# Patient Record
Sex: Male | Born: 1935 | Race: White | Hispanic: No | Marital: Married | Smoking: Former smoker
Health system: Southern US, Community
[De-identification: ages and names within clinical notes are randomized; demographics above are authoritative.]

## PROBLEM LIST (undated history)

## (undated) DIAGNOSIS — K219 Gastro-esophageal reflux disease without esophagitis: Secondary | ICD-10-CM

## (undated) DIAGNOSIS — I2510 Atherosclerotic heart disease of native coronary artery without angina pectoris: Secondary | ICD-10-CM

## (undated) DIAGNOSIS — C801 Malignant (primary) neoplasm, unspecified: Secondary | ICD-10-CM

## (undated) DIAGNOSIS — I4891 Unspecified atrial fibrillation: Secondary | ICD-10-CM

## (undated) DIAGNOSIS — I10 Essential (primary) hypertension: Secondary | ICD-10-CM

## (undated) DIAGNOSIS — D696 Thrombocytopenia, unspecified: Secondary | ICD-10-CM

## (undated) DIAGNOSIS — N400 Enlarged prostate without lower urinary tract symptoms: Secondary | ICD-10-CM

## (undated) DIAGNOSIS — I639 Cerebral infarction, unspecified: Secondary | ICD-10-CM

## (undated) DIAGNOSIS — C61 Malignant neoplasm of prostate: Secondary | ICD-10-CM

## (undated) DIAGNOSIS — C4490 Unspecified malignant neoplasm of skin, unspecified: Secondary | ICD-10-CM

## (undated) DIAGNOSIS — M1990 Unspecified osteoarthritis, unspecified site: Secondary | ICD-10-CM

## (undated) DIAGNOSIS — I499 Cardiac arrhythmia, unspecified: Secondary | ICD-10-CM

## (undated) DIAGNOSIS — Z87442 Personal history of urinary calculi: Secondary | ICD-10-CM

## (undated) DIAGNOSIS — E119 Type 2 diabetes mellitus without complications: Secondary | ICD-10-CM

## (undated) DIAGNOSIS — Z955 Presence of coronary angioplasty implant and graft: Secondary | ICD-10-CM

## (undated) HISTORY — PX: COLONOSCOPY W/ BIOPSIES AND POLYPECTOMY: SHX1376

## (undated) HISTORY — DX: Benign prostatic hyperplasia without lower urinary tract symptoms: N40.0

## (undated) HISTORY — DX: Unspecified osteoarthritis, unspecified site: M19.90

## (undated) HISTORY — DX: Atherosclerotic heart disease of native coronary artery without angina pectoris: I25.10

## (undated) HISTORY — DX: Essential (primary) hypertension: I10

## (undated) HISTORY — DX: Thrombocytopenia, unspecified: D69.6

## (undated) HISTORY — DX: Gastro-esophageal reflux disease without esophagitis: K21.9

## (undated) HISTORY — PX: BACK SURGERY: SHX140

## (undated) HISTORY — DX: Unspecified atrial fibrillation: I48.91

## (undated) NOTE — *Deleted (*Deleted)
Pt ready to be transferred back to [facility].

## (undated) NOTE — *Deleted (*Deleted)
Important Message  Patient Details IM Letter given to the Patient Name: Adrian Singh MRN: [id number] Date of Birth: 18-Nov-1933   Medicare Important Message Given:  Yes     Caren Macadam 06/22/2020, 10:58 AM

## (undated) NOTE — *Deleted (*Deleted)
51 year old male with history of metastatic prostate cancer, atrial fibrillation anticoagulated on rivaroxaban, diabetes comes in with abdominal pain and distention and no bowel movement for the last 3 days.  There is nausea but no vomiting.  Abdomen is mildly distended and soft with bowel sounds decreased.  No history of prior abdominal surgery, but history of adynamic ileus.  CT scan has been ordered.  Labs do show severe hypokalemia.  Will need to check magnesium as well.  Alkaline phosphatase is significantly elevated, presumably secondary to bone metastases from prostate cancer.

---

## 1998-05-11 ENCOUNTER — Ambulatory Visit: Admission: RE | Admit: 1998-05-11 | Discharge: 1998-05-11 | Payer: Self-pay | Admitting: Family Medicine

## 1999-06-13 ENCOUNTER — Ambulatory Visit (HOSPITAL_COMMUNITY): Admission: RE | Admit: 1999-06-13 | Discharge: 1999-06-13 | Payer: Self-pay | Admitting: *Deleted

## 1999-06-13 ENCOUNTER — Encounter: Payer: Self-pay | Admitting: *Deleted

## 1999-07-13 ENCOUNTER — Encounter: Payer: Self-pay | Admitting: *Deleted

## 1999-07-13 ENCOUNTER — Ambulatory Visit (HOSPITAL_COMMUNITY): Admission: RE | Admit: 1999-07-13 | Discharge: 1999-07-13 | Payer: Self-pay | Admitting: *Deleted

## 1999-08-02 ENCOUNTER — Ambulatory Visit (HOSPITAL_COMMUNITY): Admission: RE | Admit: 1999-08-02 | Discharge: 1999-08-02 | Payer: Self-pay | Admitting: *Deleted

## 1999-08-02 ENCOUNTER — Encounter: Payer: Self-pay | Admitting: *Deleted

## 1999-08-16 ENCOUNTER — Encounter: Payer: Self-pay | Admitting: *Deleted

## 1999-08-16 ENCOUNTER — Ambulatory Visit (HOSPITAL_COMMUNITY): Admission: RE | Admit: 1999-08-16 | Discharge: 1999-08-16 | Payer: Self-pay | Admitting: *Deleted

## 1999-08-30 ENCOUNTER — Encounter: Payer: Self-pay | Admitting: *Deleted

## 1999-08-30 ENCOUNTER — Ambulatory Visit (HOSPITAL_COMMUNITY): Admission: RE | Admit: 1999-08-30 | Discharge: 1999-08-30 | Payer: Self-pay | Admitting: *Deleted

## 1999-09-18 HISTORY — PX: ESOPHAGOGASTRODUODENOSCOPY (EGD) WITH ESOPHAGEAL DILATION: SHX5812

## 1999-09-21 ENCOUNTER — Encounter: Payer: Self-pay | Admitting: *Deleted

## 1999-09-26 ENCOUNTER — Encounter: Payer: Self-pay | Admitting: *Deleted

## 1999-09-26 ENCOUNTER — Ambulatory Visit (HOSPITAL_COMMUNITY): Admission: RE | Admit: 1999-09-26 | Discharge: 1999-09-27 | Payer: Self-pay | Admitting: *Deleted

## 1999-09-26 HISTORY — PX: LUMBAR DISC SURGERY: SHX700

## 1999-10-19 ENCOUNTER — Encounter: Payer: Self-pay | Admitting: Gastroenterology

## 1999-10-20 ENCOUNTER — Encounter: Payer: Self-pay | Admitting: Gastroenterology

## 1999-10-20 ENCOUNTER — Encounter (INDEPENDENT_AMBULATORY_CARE_PROVIDER_SITE_OTHER): Payer: Self-pay | Admitting: Specialist

## 1999-10-20 ENCOUNTER — Ambulatory Visit (HOSPITAL_COMMUNITY): Admission: RE | Admit: 1999-10-20 | Discharge: 1999-10-20 | Payer: Self-pay | Admitting: *Deleted

## 2003-03-30 ENCOUNTER — Ambulatory Visit (HOSPITAL_COMMUNITY): Admission: RE | Admit: 2003-03-30 | Discharge: 2003-03-30 | Payer: Self-pay | Admitting: Family Medicine

## 2003-03-30 ENCOUNTER — Encounter: Payer: Self-pay | Admitting: Family Medicine

## 2003-09-18 HISTORY — PX: SHOULDER ARTHROSCOPY W/ ROTATOR CUFF REPAIR: SHX2400

## 2004-02-18 ENCOUNTER — Ambulatory Visit (HOSPITAL_COMMUNITY): Admission: RE | Admit: 2004-02-18 | Discharge: 2004-02-18 | Payer: Self-pay | Admitting: Family Medicine

## 2004-02-25 ENCOUNTER — Encounter: Admission: RE | Admit: 2004-02-25 | Discharge: 2004-05-25 | Payer: Self-pay | Admitting: Family Medicine

## 2004-05-13 ENCOUNTER — Encounter: Admission: RE | Admit: 2004-05-13 | Discharge: 2004-05-13 | Payer: Self-pay | Admitting: Orthopaedic Surgery

## 2004-05-19 ENCOUNTER — Ambulatory Visit (HOSPITAL_COMMUNITY): Admission: RE | Admit: 2004-05-19 | Discharge: 2004-05-19 | Payer: Self-pay | Admitting: Surgery

## 2004-05-19 ENCOUNTER — Ambulatory Visit (HOSPITAL_BASED_OUTPATIENT_CLINIC_OR_DEPARTMENT_OTHER): Admission: RE | Admit: 2004-05-19 | Discharge: 2004-05-19 | Payer: Self-pay | Admitting: Surgery

## 2005-12-20 ENCOUNTER — Encounter: Admission: RE | Admit: 2005-12-20 | Discharge: 2006-01-07 | Payer: Self-pay | Admitting: Gastroenterology

## 2007-12-17 HISTORY — PX: CARDIAC CATHETERIZATION: SHX172

## 2008-01-05 ENCOUNTER — Ambulatory Visit: Payer: Self-pay | Admitting: Cardiology

## 2008-01-06 ENCOUNTER — Inpatient Hospital Stay (HOSPITAL_COMMUNITY): Admission: EM | Admit: 2008-01-06 | Discharge: 2008-01-07 | Payer: Self-pay | Admitting: Emergency Medicine

## 2008-01-07 ENCOUNTER — Encounter: Payer: Self-pay | Admitting: Cardiology

## 2009-12-23 ENCOUNTER — Ambulatory Visit: Payer: Self-pay | Admitting: Internal Medicine

## 2009-12-23 DIAGNOSIS — K219 Gastro-esophageal reflux disease without esophagitis: Secondary | ICD-10-CM | POA: Insufficient documentation

## 2009-12-23 DIAGNOSIS — M1990 Unspecified osteoarthritis, unspecified site: Secondary | ICD-10-CM | POA: Insufficient documentation

## 2009-12-23 DIAGNOSIS — I10 Essential (primary) hypertension: Secondary | ICD-10-CM | POA: Insufficient documentation

## 2009-12-23 DIAGNOSIS — N400 Enlarged prostate without lower urinary tract symptoms: Secondary | ICD-10-CM | POA: Insufficient documentation

## 2009-12-23 DIAGNOSIS — R000 Tachycardia, unspecified: Secondary | ICD-10-CM | POA: Insufficient documentation

## 2009-12-23 DIAGNOSIS — B070 Plantar wart: Secondary | ICD-10-CM | POA: Insufficient documentation

## 2010-01-16 LAB — HM DIABETES EYE EXAM: HM Diabetic Eye Exam: NORMAL

## 2010-01-20 ENCOUNTER — Encounter (INDEPENDENT_AMBULATORY_CARE_PROVIDER_SITE_OTHER): Payer: Self-pay | Admitting: *Deleted

## 2010-01-24 ENCOUNTER — Ambulatory Visit: Payer: Self-pay | Admitting: Gastroenterology

## 2010-01-24 ENCOUNTER — Encounter (INDEPENDENT_AMBULATORY_CARE_PROVIDER_SITE_OTHER): Payer: Self-pay | Admitting: *Deleted

## 2010-02-07 ENCOUNTER — Ambulatory Visit: Payer: Self-pay | Admitting: Gastroenterology

## 2010-02-07 LAB — HM COLONOSCOPY

## 2010-02-14 ENCOUNTER — Encounter: Payer: Self-pay | Admitting: Gastroenterology

## 2010-05-04 ENCOUNTER — Ambulatory Visit: Payer: Self-pay | Admitting: Cardiology

## 2010-05-19 ENCOUNTER — Ambulatory Visit: Payer: Self-pay | Admitting: Internal Medicine

## 2010-05-19 LAB — CONVERTED CEMR LAB: Hgb A1c MFr Bld: 7.1 % — ABNORMAL HIGH (ref 4.6–6.5)

## 2010-06-05 ENCOUNTER — Telehealth: Payer: Self-pay | Admitting: Internal Medicine

## 2010-10-15 LAB — CONVERTED CEMR LAB
ALT: 26 units/L (ref 0–53)
AST: 27 units/L (ref 0–37)
Albumin: 4.6 g/dL (ref 3.5–5.2)
Alkaline Phosphatase: 105 units/L (ref 39–117)
BUN: 21 mg/dL (ref 6–23)
Basophils Absolute: 0 10*3/uL (ref 0.0–0.1)
Basophils Relative: 1 % (ref 0–1)
CO2: 27 meq/L (ref 19–32)
Calcium: 8.9 mg/dL (ref 8.4–10.5)
Chloride: 104 meq/L (ref 96–112)
Cholesterol: 151 mg/dL (ref 0–200)
Creatinine, Ser: 1.32 mg/dL (ref 0.40–1.50)
Creatinine, Urine: 86.4 mg/dL
Eosinophils Absolute: 0.1 10*3/uL (ref 0.0–0.7)
Eosinophils Relative: 1 % (ref 0–5)
Glucose, Bld: 82 mg/dL (ref 70–99)
HCT: 42.7 % (ref 39.0–52.0)
HDL: 33 mg/dL — ABNORMAL LOW (ref >39)
Hemoglobin: 13.5 g/dL (ref 13.0–17.0)
Hgb A1c MFr Bld: 7.2 % — ABNORMAL HIGH (ref 4.6–6.1)
LDL Cholesterol: 81 mg/dL (ref 0–99)
Lymphocytes Relative: 30 % (ref 12–46)
Lymphs Abs: 1.7 10*3/uL (ref 0.7–4.0)
MCHC: 31.6 g/dL (ref 30.0–36.0)
MCV: 87 fL (ref 78.0–100.0)
Microalb Creat Ratio: 5.8 mg/g (ref 0.0–30.0)
Microalb, Ur: 0.5 mg/dL (ref 0.00–1.89)
Monocytes Absolute: 0.7 10*3/uL (ref 0.1–1.0)
Monocytes Relative: 12 % (ref 3–12)
Neutro Abs: 3.3 10*3/uL (ref 1.7–7.7)
Neutrophils Relative %: 57 % (ref 43–77)
Platelets: 142 10*3/uL — ABNORMAL LOW (ref 150–400)
Potassium: 4.5 meq/L (ref 3.5–5.3)
RBC: 4.91 M/uL (ref 4.22–5.81)
RDW: 13.9 % (ref 11.5–15.5)
Sodium: 140 meq/L (ref 135–145)
Total Bilirubin: 0.4 mg/dL (ref 0.3–1.2)
Total CHOL/HDL Ratio: 4.6
Total Protein: 7.7 g/dL (ref 6.0–8.3)
Triglycerides: 184 mg/dL — ABNORMAL HIGH (ref <150)
VLDL: 37 mg/dL (ref 0–40)
WBC: 5.8 10*3/uL (ref 4.0–10.5)

## 2010-10-17 NOTE — Procedures (Signed)
Summary: Colonoscopy  Patient: [name] Note: All result statuses are Final unless otherwise noted.  Tests: (1) Colonoscopy (COL)   COL Colonoscopy           DONE     [facility]     [address]     [place], [place]  [address]           COLONOSCOPY PROCEDURE REPORT           PATIENT:  Adrian Singh, Adrian Singh  MR#:  [id number]     BIRTHDATE:  [birth year], 75 yrs. old  GENDER:  male     ENDOSCOPIST:  Rachael Fee, MD     REF. BY:  Tillman Abide, M.D.     PROCEDURE DATE:  02/07/2010     PROCEDURE:  Colonoscopy with biopsy, Colonoscopy with snare     polypectomy     ASA CLASS:  Class II     INDICATIONS:  Routine Risk Screening     MEDICATIONS:   Fentanyl 50 mcg IV, Versed 5 mg IV           DESCRIPTION OF PROCEDURE:   After the risks benefits and     alternatives of the procedure were thoroughly explained, informed     consent was obtained.  Digital rectal exam was performed and     revealed no rectal masses.   The LB CF-[id number] endoscope     was introduced through the anus and advanced to the cecum, which     was identified by both the appendix and ileocecal valve, without     limitations.  The quality of the prep was good, using MoviPrep.     The instrument was then slowly withdrawn as the colon was fully     examined.     <<PROCEDUREIMAGES>>           FINDINGS: There was a focal 10-25mm area of ertyhematous, friable     mucosa containing a small clean based, shallow ulcer (2mm) located     in cecum. This did not appear neoplastic, multiple biopsies were     taken (see image2), pathology jar 1.  A sessile polyp was found in     the mid transverse colon. This was 4mm aross, removed with cold     snare and sent to pathology (jar 2) (see image3 and image4).  This     was otherwise a normal examination of the colon (see image1 and     image5).  External hemorrhoids were found. These were small, not     thrombosed.   Retroflexed views in the rectum revealed no  abnormalities.    The scope was then withdrawn from the patient     and the procedure completed.           COMPLICATIONS:  None           ENDOSCOPIC IMPRESSION:     1) Abnormal mucosa in cecum containing small shallow ulcer, did     not appear neoplastic.  Biopsied, suspect NSAID related changes.     2) Sessile polyp in the mid transverse colon, removed and sent     to pathology     3) Otherwise normal examination     4) External hemorrhoids, small           RECOMMENDATIONS:     1) If the polyp(s) removed today are proven to be adenomatous     (pre-cancerous) polyps, you will need a repeat colonoscopy in  5     years. Otherwise you should continue to follow colorectal cancer     screening guidelines for "routine risk" patients with colonoscopy     in 10 years.     2) You will receive a letter within 1-2 weeks with the results     of your biopsy as well as final recommendations. Please call my     office if you have not received a letter after 3 weeks.           ______________________________     Rachael Fee, MD           n.     eSIGNED:   Rachael Fee at 02/07/2010 09:50 AM           Elliot Dally, [phone]  Note: An exclamation mark (!) indicates a result that was not dispersed into the flowsheet. Document Creation Date: 02/07/2010 9:51 AM _______________________________________________________________________  (1) Order result status: Final Collection or observation date-time: 02/07/2010 09:39 Requested date-time:  Receipt date-time:  Reported date-time:  Referring Physician:   Ordering Physician: Rob Bunting [id number]) Specimen Source:  Source: Launa Grill Order Number: [id number] Lab site:   Appended Document: Colonoscopy     Procedures Next Due Date:    Colonoscopy: 01/2020

## 2010-10-17 NOTE — Letter (Signed)
Summary: [facility] Instructions  [facility]  [address] [place], [place] [address]   Phone: [phone]  Fax: [phone]       Lindon PIZZUTO    [birth year]    MRN: [id number]        Procedure Day Dorna Bloom:  Jake Shark  02/07/10     Arrival Time:  8:00am     Procedure Time:  9:00am     Location of Procedure:                    Juliann Pares  [facility] (4th Floor)                        PREPARATION FOR COLONOSCOPY WITH MOVIPREP   Starting 5 days prior to your procedure THURSDAY 02/02/10  do not eat nuts, seeds, popcorn, corn, beans, peas,  salads, or any raw vegetables.  Do not take any fiber supplements (e.g. Metamucil, Citrucel, and Benefiber).  THE DAY BEFORE YOUR PROCEDURE         DATE:  02/06/10   DAY: MONDAY  1.  Drink clear liquids the entire day-NO SOLID FOOD  2.  Do not drink anything colored red or purple.  Avoid juices with pulp.  No orange juice.  3.  Drink at least 64 oz. (8 glasses) of fluid/clear liquids during the day to prevent dehydration and help the prep work efficiently.  CLEAR LIQUIDS INCLUDE: Water Jello Ice Popsicles Tea (sugar ok, no milk/cream) Powdered fruit flavored drinks Coffee (sugar ok, no milk/cream) Gatorade Juice: apple, white grape, white cranberry  Lemonade Clear bullion, consomm, broth Carbonated beverages (any kind) Strained chicken noodle soup Hard Candy                             4.  In the morning, mix first dose of MoviPrep solution:    Empty 1 Pouch A and 1 Pouch B into the disposable container    Add lukewarm drinking water to the top line of the container. Mix to dissolve    Refrigerate (mixed solution should be used within 24 hrs)  5.  Begin drinking the prep at 5:00 p.m. The MoviPrep container is divided by 4 marks.   Every 15 minutes drink the solution down to the next mark (approximately 8 oz) until the full liter is complete.   6.  Follow completed prep with 16 oz of clear liquid of your  choice (Nothing red or purple).  Continue to drink clear liquids until bedtime.  7.  Before going to bed, mix second dose of MoviPrep solution:    Empty 1 Pouch A and 1 Pouch B into the disposable container    Add lukewarm drinking water to the top line of the container. Mix to dissolve    Refrigerate  THE DAY OF YOUR PROCEDURE      DATE: 02/07/10  DAY: TUESDAY  Beginning at 4:00 a.m. (5 hours before procedure):         1. Every 15 minutes, drink the solution down to the next mark (approx 8 oz) until the full liter is complete.  2. Follow completed prep with 16 oz. of clear liquid of your choice.    3. You may drink clear liquids until 7:00am (2 HOURS BEFORE PROCEDURE).   MEDICATION INSTRUCTIONS  Unless otherwise instructed, you should take regular prescription medications with a small sip of water   as  early as possible the morning of your procedure.  See separate diabetic instructions  Additional medication instructions: Hold Triam/HCTZ and Glimepiride the morning of procedure.          OTHER INSTRUCTIONS  You will need a responsible adult at least 75 years of age to accompany you and drive you home.   This person must remain in the waiting room during your procedure.  Wear loose fitting clothing that is easily removed.  Leave jewelry and other valuables at home.  However, you may wish to bring a book to read or  an iPod/MP3 player to listen to music as you wait for your procedure to start.  Remove all body piercing jewelry and leave at home.  Total time from sign-in until discharge is approximately 2-3 hours.  You should go home directly after your procedure and rest.  You can resume normal activities the  day after your procedure.  The day of your procedure you should not:   Drive   Make legal decisions   Operate machinery   Drink alcohol   Return to work  You will receive specific instructions about eating, activities and medications before you  leave.    The above instructions have been reviewed and explained to me by   Wyona Almas RN  Jan 24, 2010 10:35 AM     I fully understand and can verbalize these instructions _____________________________ Date _________

## 2010-10-17 NOTE — Letter (Signed)
Summary: Diabetic Instructions  [facility]  [address] [place], [place] [address]   Phone: [phone]  Fax: [phone]    Adrian Singh [birth year] MRN: [id number]   _x  _   ORAL DIABETIC MEDICATION INSTRUCTIONS  The day before your procedure:   Take your diabetic pill as you do normally  The day of your procedure:   Do not take your diabetic pill    We will check your blood sugar levels during the admission process and again in Recovery before discharging you home  ________________________________________________________________________

## 2010-10-17 NOTE — Assessment & Plan Note (Signed)
Summary: NEW MEDICARE PT TO ESTABH   Vital Signs:  Patient profile:   77 year old male Height:      69 inches Weight:      218 pounds BMI:     32.31 Temp:     98.5 degrees F oral Pulse rate:   72 / minute Pulse rhythm:   regular BP sitting:   138 / 68  (left arm) Cuff size:   large  Vitals Entered By: Mervin Hack CMA Duncan Dull) (December 23, 2009 2:13 PM) CC: new patient to establish care   History of Present Illness: Wife switched doctors and so he is switching Lives in [place]  Sees Dr Sw[place]and had rapid heart rate  2008-02-25  (as high as 180) had cath and has done well on the medication Not sure what the diagnosis is   HTN also Goes back to 1979  DM since  2003-02-25 Checks sugars  ~ once a week usually around 120 in AM using just 1/2 tab---had low sugar reactions on full tab Overdue for eye exam some foot pain from bunion no ulcers  GERD in past symptoms controlled with the pantoprazole  some BPH  voids okay  chronic arthritis takes aleve  ~ daily stays active and this helps    Preventive Screening-Counseling & Management  Alcohol-Tobacco     Smoking Status: quit  Allergies (verified): No Known Drug Allergies  Past History:  Past Medical History: Diabetes mellitus, type II GERD Hypertension Osteoarthritis Benign prostatic hypertrophy Tachycardia------------------------------------------- Dr Swaziland  Past Surgical History: EGD with dilation  02/25/00 Back surgery  02/25/00 Shoulder surgery (left rotator cuff)  2004-02-25  Family History: Dad died @73    stroke, PVD (amputation) Mom died @92   CHF, CAD with multiple MI 5 brothers--2 died (COPD, heart//// some cancer) 4 sisters--1 sister died (lung cancer,etc) No colon or prostate cancer DM in nephew (IDDM)  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 1 son died in MVA Former Smoker--quit after just a few years Alcohol use-no Smoking Status:  quit  Review of  Systems General:  Denies sleep disorder; weight stable wears seat belt. Eyes:  Complains of vision loss-1 eye; denies double vision; left eye is "lazy". ENT:  Complains of decreased hearing; denies ringing in ears; stable hearing loss Full dentures. CV:  Complains of chest pain or discomfort, lightheadness, and palpitations; denies difficulty breathing at night, difficulty breathing while lying down, fainting, and shortness of breath with exertion; seldom has chest pain--just when stressed out rare palpitations--occ pause Occ mild orthostatic dizziness. Resp:  Denies cough and shortness of breath. GI:  Complains of constipation and indigestion; denies abdominal pain, bloody stools, dark tarry stools, nausea, and vomiting; heartburn controlled well. GU:  Complains of nocturia and urinary frequency; stable nocturia x 2. MS:  Complains of joint pain and low back pain; denies joint swelling; does okay with exercise and aleve. Derm:  Complains of lesion(s); denies rash; round spot on right foot no itching. Neuro:  Complains of tingling; denies headaches, numbness, and weakness; occ left leg tingling when back acts up. Psych:  Denies anxiety and depression. Heme:  Denies abnormal bruising and enlarge lymph nodes. Allergy:  Denies seasonal allergies and sneezing; no major symptoms.  Physical Exam  General:  alert and normal appearance.   Eyes:  pupils equal, pupils round, and pupils reactive to light.   Mouth:  no erythema, no exudates, and edentulous.   Plates in  Neck:  supple, no masses, no  thyromegaly, no carotid bruits, and no cervical lymphadenopathy.   Lungs:  normal respiratory effort and normal breath sounds.   Heart:  normal rate, regular rhythm, no murmur, and no gallop.   Abdomen:  soft and non-tender.   Msk:  no joint tenderness and no joint swelling.   Pulses:  2+ in feet Extremities:  no edema Neurologic:  alert & oriented X3, strength normal in all extremities, and gait  normal.   Skin:  plantar wart on right no suspicious lesions and no ulcerations.   Axillary Nodes:  No palpable lymphadenopathy Psych:  normally interactive, good eye contact, not anxious appearing, and not depressed appearing.    Diabetes Management Exam:    Foot Exam (with socks and/or shoes not present):       Sensory-Pinprick/Light touch:          Left medial foot (L-4): diminished          Left dorsal foot (L-5): diminished          Left lateral foot (S-1): diminished          Right medial foot (L-4): diminished          Right dorsal foot (L-5): diminished          Right lateral foot (S-1): diminished       Inspection:          Left foot: normal          Right foot: abnormal             Comments: callous under corn pad with wart       Nails:          Left foot: fungal infection          Right foot: fungal infection   Impression & Recommendations:  Problem # 1:  HYPERTENSION (ICD-401.9) Assessment Comment Only  good control no changes needed check labs His updated medication list for this problem includes:    Ramipril 5 Mg Caps (Ramipril) .Marland Kitchen... Take 1 by mouth once daily    Metoprolol Succinate 50 Mg Xr24h-tab (Metoprolol succinate) .Marland Kitchen... Take 1 by mouth once daily    Triamterene-hctz 37.5-25 Mg Tabs (Triamterene-hctz) .Marland Kitchen... Take 1 by mouth once daily    Doxazosin Mesylate 2 Mg Tabs (Doxazosin mesylate) .Marland Kitchen... Take 1 by mouth once daily  BP today: 138/68  Orders: T-Comprehensive Metabolic Panel ([address]2952-84132) T-CBC w/Diff (44010-27253)  Problem # 2:  TACHYCARDIA (ICD-785.0) Assessment: Comment Only  sounds like PSVT ??atrial fib paroxysm will wait till I can review Dr Elvis Coil next note  Orders: EKG w/ Interpretation ([address])  Problem # 3:  DIABETES MELLITUS, TYPE II (ICD-250.00) Assessment: Comment Only  seems to have good control will set up labs and eye appt  His updated medication list for this problem includes:    Ramipril 5 Mg Caps (Ramipril) .Marland Kitchen...  Take 1 by mouth once daily    Glimepiride 4 Mg Tabs (Glimepiride) .Marland Kitchen... Take 1 by mouth once daily    Aspirin 325 Mg Tabs (Aspirin) .Marland Kitchen... Take 1 by mouth once daily  Orders: Venipuncture ([address]) Specimen Handling ([address]) T- Hemoglobin A1C (59563-87564) T-Urine Microalbumin w/creat. ratio [phone]) T-Lipid Profile [phone]) Ophthalmology Referral (Ophthalmology)  Problem # 4:  GERD (ICD-530.81) Assessment: Unchanged okay with med  His updated medication list for this problem includes:    Pantoprazole Sodium 40 Mg Tbec (Pantoprazole sodium) .Marland Kitchen... Take 1 by mouth once daily  Problem # 5:  BENIGN PROSTATIC HYPERTROPHY (ICD-600.00) Assessment: Unchanged voids fairly well  on doxazosin  Problem # 6:  OSTEOARTHRITIS (ICD-715.90) Assessment: Comment Only mild symptoms  mostly back   Problem # 7:  PLANTAR WART, RIGHT (ICD-078.12) Assessment: Comment Only  pared with scalpel and abnormal tissue treated with liquid nitrogen fo 25 seconds x 2  Orders: Wart Destruct <14 ([address])  Complete Medication List: 1)  Pantoprazole Sodium 40 Mg Tbec (Pantoprazole sodium) .... Take 1 by mouth once daily 2)  Ramipril 5 Mg Caps (Ramipril) .... Take 1 by mouth once daily 3)  Metoprolol Succinate 50 Mg Xr24h-tab (Metoprolol succinate) .... Take 1 by mouth once daily 4)  Triamterene-hctz 37.5-25 Mg Tabs (Triamterene-hctz) .... Take 1 by mouth once daily 5)  Doxazosin Mesylate 2 Mg Tabs (Doxazosin mesylate) .... Take 1 by mouth once daily 6)  Glimepiride 4 Mg Tabs (Glimepiride) .... Take 1 by mouth once daily 7)  Aspirin 325 Mg Tabs (Aspirin) .... Take 1 by mouth once daily  Other Orders: Tdap => 89yrs IM ([address]) Admin 1st Vaccine ([address]) Gastroenterology Referral (GI)  Patient Instructions: 1)  Try stablity shoes 2)  Please schedule a follow-up appointment in 6 months .  3)  Schedule a colonoscopy/ sigmoidoscopy to help detect colon cancer.  4)  Referral Appointment Information 5)   Day/Date: 6)  Time: 7)  Place/MD: 8)  Address: 9)  Phone/Fax: 10)  Patient given appointment information. Information/Orders faxed/mailed.  Current Allergies (reviewed today): No known allergies    Immunization History:  Tetanus/Td Immunization History:    Tetanus/Td:  Tdap (12/23/2009)  Pneumovax Immunization History:    Pneumovax:  Historical (12/17/2006)  Immunizations Administered:  Tetanus Vaccine:    Vaccine Type: Tdap    Site: left deltoid    Mfr: GlaxoSmithKline    Dose: 0.5 ml    Route: IM    Given by: Mervin Hack CMA (AAMA)    Exp. Date: 12/10/2011    Lot #: [id number]    VIS given: 08/05/07 version given December 23, 2009.     EKG  Procedure date:  12/23/2009  Findings:      sinus @63  possible prior IWMI otherwise normal  Appended Document: NEW MEDICARE PT TO ESTABH

## 2010-10-17 NOTE — Miscellaneous (Signed)
Summary: [facility] Previsit/prep  Clinical Lists Changes  Medications: Added new medication of MOVIPREP 100 GM  SOLR (PEG-KCL-NACL-NASULF-NA ASC-C) As per prep instructions. - Signed Rx of MOVIPREP 100 GM  SOLR (PEG-KCL-NACL-NASULF-NA ASC-C) As per prep instructions.;  #1 x 0;  Signed;  Entered by: Wyona Almas RN;  Authorized by: Rachael Fee MD;  Method used: [facility] to [facility] [phone]*, [address] [place], [place], [place]  [address], Ph: [phone] or [phone], Fax: [phone] Observations: Added new observation of NKA: T (01/24/2010 9:49)    Prescriptions: MOVIPREP 100 GM  SOLR (PEG-KCL-NACL-NASULF-NA ASC-C) As per prep instructions.  #1 x 0   Entered by:   Wyona Almas RN   Authorized by:   Rachael Fee MD   Signed by:   Wyona Almas RN on 01/24/2010   Method used:   [facility]ically to        [facility] [phone]* (retail)       [address] [place], [place]  [address]       Ph: [phone] or [phone]       Fax: [phone]   RxID:   [id number]

## 2010-10-17 NOTE — Progress Notes (Signed)
Summary: wants to change to flomax  Phone Note Call from Patient Call back at Home Phone [phone]   Caller: Patient Summary of Call: Pt has been on doxazosin but wants to change to flomax- doxazosin is not working well for him.  He requests a 90 day supply [facility] [address] Initial call taken by: Lowella Petties CMA,  June 05, 2010 12:42 PM  Follow-up for Phone Call        okay to stop the doxazosin and send Rx for tamsulosin 0.4mg  #90 x 3 1 daily  if that doesn't work, we may need to add another med Follow-up by: Cindee Salt MD,  June 05, 2010 1:49 PM  Additional Follow-up for Phone Call Additional follow up Details #1::        [facility] [address] [facility] in [place]. Additional Follow-up by: Lowella Petties CMA,  June 05, 2010 2:46 PM    New/Updated Medications: FLOMAX 0.4 MG CAPS (TAMSULOSIN HCL) take one by mouth daily Prescriptions: FLOMAX 0.4 MG CAPS (TAMSULOSIN HCL) take one by mouth daily  #90 x 3   Entered by:   Lowella Petties CMA   Authorized by:   Cindee Salt MD   Signed by:   Lowella Petties CMA on 06/05/2010   Method used:   [facility]ronically to        [facility] [phone]* (retail)       [address] [place], [place]  [address]       Ph: [phone] or [phone]       Fax: [phone]   RxID:   [id number]   Prior Medications: PANTOPRAZOLE SODIUM 40 MG TBEC (PANTOPRAZOLE SODIUM) take 1 by mouth once daily RAMIPRIL 5 MG CAPS (RAMIPRIL) take 1 by mouth once daily METOPROLOL SUCCINATE 50 MG XR24H-TAB (METOPROLOL SUCCINATE) take 1 by mouth once daily TRIAMTERENE-HCTZ 37.5-25 MG TABS (TRIAMTERENE-HCTZ) take 1/2  by mouth once daily GLIMEPIRIDE 4 MG TABS (GLIMEPIRIDE) take 1/2  by mouth once daily ASPIRIN 325 MG TABS (ASPIRIN) take 1 by mouth once daily Current Allergies: No known allergies

## 2010-10-17 NOTE — Letter (Signed)
Summary: Results Letter  [facility]  [address] [place], [place] [address]   Phone: [phone]  Fax: [phone]        Feb 14, 2010 MRN: [id number]    [facility] [address] [place], [place]  [address]    Dear Mr. Diemer,    Good news.  The polyp(s) that were removed during your recent procedure were NOT pre-cancerous. You should continue to follow current colorectal cancer screening guidelines with a repeat colonoscopy in 10 years.  We will therefore put your information in our reminder system and will contact you in 10 years to schedule a repeat procedure.    Also, the biopsies I took from the shallow ulceration confirmed inflammation without any sign of cancer.  I suspec the ulcer is from NSAID, ASA type medicines.  You should limit those medicines as best as possible to avoid further damage to colon, GI tract.     Sincerely,  Rachael Fee MD  This letter has been electronically signed by your physician.  Appended Document: Results Letter letter mailed.

## 2010-10-17 NOTE — Procedures (Signed)
Summary: [facility] / [facility]  [facility] / [facility]   Imported By: Lennie Odor 02/10/2010 13:48:53  _____________________________________________________________________  External Attachment:    Type:   Image     Comment:   External Document

## 2010-10-17 NOTE — Assessment & Plan Note (Signed)
Summary: ROA FOR 6 MTH F/UP/JRR R/S FROM 9/16   Vital Signs:  Patient profile:   77 year old male Height:      69 inches Weight:      216 pounds BMI:     32.01 Temp:     98.1 degrees F oral Pulse rate:   76 / minute Pulse rhythm:   regular BP sitting:   163 / 82  (left arm) Cuff size:   large  Vitals Entered By: Lewanda Rife LPN (May 19, 2010 8:16 AM) CC: six month f/u   History of Present Illness: Doing well Did have colonoscopy. Has cut out most of his aleve in view of the colitis  Feels that the aleve does do better for his arthritis Generally only uses about 2 per week. Did have period of taking more when back and shoulders were worse 15-20 minutes of exercise seem to really help his back Able to stretch slowly and not getting the spasm the same  Checks sugars  1-2 per week Run  ~120 and below usually Occ mild hypoglycemic reactions but not really since back on 1/2 tab  No trouble voiding Nocturia x 2 in general No sig daytime troubles  No chest pain  No SOB in general Occ lightheadedness if he raises up quickly from bending over---can be slight SOB then  Allergies (verified): No Known Drug Allergies  Past History:  Past medical, surgical, family and social histories (including risk factors) reviewed for relevance to current acute and chronic problems.  Past Medical History: Reviewed history from 12/23/2009 and no changes required. Diabetes mellitus, type II GERD Hypertension Osteoarthritis Benign prostatic hypertrophy Tachycardia------------------------------------------- Dr Swaziland  Past Surgical History: Reviewed history from 12/23/2009 and no changes required. EGD with dilation  2001 Back surgery  2001 Shoulder surgery (left rotator cuff)  2005  Family History: Reviewed history from 12/23/2009 and no changes required. Dad died @73    stroke, PVD (amputation) Mom died @92   CHF, CAD with multiple MI 5 brothers--2 died (COPD, heart//// some  cancer) 4 sisters--1 sister died (lung cancer,etc) No colon or prostate cancer DM in nephew (IDDM)  Social History: Reviewed history from 12/23/2009 and no changes required. 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 1 son died in MVA Former Smoker--quit after just a few years Alcohol use-no  Review of Systems       Occ gets crick in neck Weight fairly stable sleeps okay  Physical Exam  General:  alert and normal appearance.   Neck:  supple, no masses, no thyromegaly, no carotid bruits, and no cervical lymphadenopathy.   Lungs:  normal respiratory effort, no intercostal retractions, no accessory muscle use, and normal breath sounds.   Heart:  normal rate, regular rhythm, no murmur, and no gallop.   Pulses:  1+ in feet Extremities:  no edema Psych:  normally interactive, good eye contact, not anxious appearing, and not depressed appearing.    Diabetes Management Exam:    Foot Exam (with socks and/or shoes not present):       Sensory-Pinprick/Light touch:          Left medial foot (L-4): diminished          Left dorsal foot (L-5): diminished          Left lateral foot (S-1): diminished          Right medial foot (L-4): diminished          Right dorsal foot (L-5): diminished  Right lateral foot (S-1): diminished       Inspection:          Left foot: normal          Right foot: abnormal             Comments: corn on plantar surface       Nails:          Left foot: fungal infection          Right foot: fungal infection    Eye Exam:       Eye Exam done elsewhere          Date: 01/16/2010          Results: normal          Done by:  Eye   Impression & Recommendations:  Problem # 1:  DIABETES MELLITUS, TYPE II (ICD-250.00) Assessment Unchanged  seems to have good control will recheck LDL <100 without meds  His updated medication list for this problem includes:    Ramipril 5 Mg Caps (Ramipril) .Marland Kitchen... Take 1 by mouth once  daily    Glimepiride 4 Mg Tabs (Glimepiride) .Marland Kitchen... Take 1/2  by mouth once daily    Aspirin 325 Mg Tabs (Aspirin) .Marland Kitchen... Take 1 by mouth once daily  Labs Reviewed: Creat: 1.32 (12/23/2009)     Last Eye Exam: normal (01/16/2010) Reviewed HgBA1c results: 7.2 (12/23/2009)  Orders: Venipuncture (36644) TLB-A1C / Hgb A1C (Glycohemoglobin) (83036-A1C)  Problem # 2:  HYPERTENSION (ICD-401.9) Assessment: Comment Only probably fine didn't take his meds yet today  His updated medication list for this problem includes:    Ramipril 5 Mg Caps (Ramipril) .Marland Kitchen... Take 1 by mouth once daily    Metoprolol Succinate 50 Mg Xr24h-tab (Metoprolol succinate) .Marland Kitchen... Take 1 by mouth once daily    Triamterene-hctz 37.5-25 Mg Tabs (Triamterene-hctz) .Marland Kitchen... Take 1/2  by mouth once daily    Doxazosin Mesylate 2 Mg Tabs (Doxazosin mesylate) .Marland Kitchen... Take 1 by mouth once daily  BP today: 163/82 Prior BP: 138/68 (12/23/2009)  Labs Reviewed: K+: 4.5 (12/23/2009) Creat: : 1.32 (12/23/2009)   Chol: 151 (12/23/2009)   HDL: 33 (12/23/2009)   LDL: 81 (12/23/2009)   TG: 184 (12/23/2009)  Problem # 3:  BENIGN PROSTATIC HYPERTROPHY (ICD-600.00) Assessment: Unchanged voids okay on med mild orthostasis---if worsens would change to tamsulosin  Problem # 4:  OSTEOARTHRITIS (ICD-715.90) Assessment: Improved only rarely needs the aleve now  His updated medication list for this problem includes:    Aspirin 325 Mg Tabs (Aspirin) .Marland Kitchen... Take 1 by mouth once daily  Complete Medication List: 1)  Pantoprazole Sodium 40 Mg Tbec (Pantoprazole sodium) .... Take 1 by mouth once daily 2)  Ramipril 5 Mg Caps (Ramipril) .... Take 1 by mouth once daily 3)  Metoprolol Succinate 50 Mg Xr24h-tab (Metoprolol succinate) .... Take 1 by mouth once daily 4)  Triamterene-hctz 37.5-25 Mg Tabs (Triamterene-hctz) .... Take 1/2  by mouth once daily 5)  Doxazosin Mesylate 2 Mg Tabs (Doxazosin mesylate) .... Take 1 by mouth once daily 6)   Glimepiride 4 Mg Tabs (Glimepiride) .... Take 1/2  by mouth once daily 7)  Aspirin 325 Mg Tabs (Aspirin) .... Take 1 by mouth once daily  Patient Instructions: 1)  Please schedule a follow-up appointment in 6 months .   Current Allergies (reviewed today): No known allergies

## 2010-10-30 ENCOUNTER — Encounter: Payer: Self-pay | Admitting: Internal Medicine

## 2010-10-30 ENCOUNTER — Other Ambulatory Visit: Payer: Self-pay | Admitting: Internal Medicine

## 2010-10-30 ENCOUNTER — Ambulatory Visit (INDEPENDENT_AMBULATORY_CARE_PROVIDER_SITE_OTHER): Payer: Medicare Other | Admitting: Internal Medicine

## 2010-10-30 DIAGNOSIS — N400 Enlarged prostate without lower urinary tract symptoms: Secondary | ICD-10-CM

## 2010-10-30 DIAGNOSIS — R000 Tachycardia, unspecified: Secondary | ICD-10-CM

## 2010-10-30 DIAGNOSIS — I10 Essential (primary) hypertension: Secondary | ICD-10-CM

## 2010-10-30 DIAGNOSIS — E119 Type 2 diabetes mellitus without complications: Secondary | ICD-10-CM

## 2010-10-30 LAB — RENAL FUNCTION PANEL
Albumin: 4 g/dL (ref 3.5–5.2)
BUN: 17 mg/dL (ref 6–23)
CO2: 28 mEq/L (ref 19–32)
Calcium: 9 mg/dL (ref 8.4–10.5)
Chloride: 99 mEq/L (ref 96–112)
Creatinine, Ser: 1.4 mg/dL (ref 0.4–1.5)
GFR: 51.35 mL/min — ABNORMAL LOW (ref >60.00)
Glucose, Bld: 114 mg/dL — ABNORMAL HIGH (ref 70–99)
Phosphorus: 2.8 mg/dL (ref 2.3–4.6)
Potassium: 5 mEq/L (ref 3.5–5.1)
Sodium: 141 mEq/L (ref 135–145)

## 2010-10-30 LAB — CBC WITH DIFFERENTIAL/PLATELET
Basophils Absolute: 0 10*3/uL (ref 0.0–0.1)
Basophils Relative: 0.6 % (ref 0.0–3.0)
Eosinophils Absolute: 0 10*3/uL (ref 0.0–0.7)
Eosinophils Relative: 0.7 % (ref 0.0–5.0)
HCT: 42.4 % (ref 39.0–52.0)
Hemoglobin: 14.2 g/dL (ref 13.0–17.0)
Lymphocytes Relative: 25.8 % (ref 12.0–46.0)
Lymphs Abs: 1.7 10*3/uL (ref 0.7–4.0)
MCHC: 33.5 g/dL (ref 30.0–36.0)
MCV: 85.5 fl (ref 78.0–100.0)
Monocytes Absolute: 0.6 10*3/uL (ref 0.1–1.0)
Monocytes Relative: 9 % (ref 3.0–12.0)
Neutro Abs: 4.3 10*3/uL (ref 1.4–7.7)
Neutrophils Relative %: 63.9 % (ref 43.0–77.0)
Platelets: 132 10*3/uL — ABNORMAL LOW (ref 150.0–400.0)
RBC: 4.96 Mil/uL (ref 4.22–5.81)
RDW: 15.4 % — ABNORMAL HIGH (ref 11.5–14.6)
WBC: 6.7 10*3/uL (ref 4.5–10.5)

## 2010-10-30 LAB — HEPATIC FUNCTION PANEL
ALT: 30 U/L (ref 0–53)
AST: 30 U/L (ref 0–37)
Albumin: 4 g/dL (ref 3.5–5.2)
Alkaline Phosphatase: 101 U/L (ref 39–117)
Bilirubin, Direct: 0.1 mg/dL (ref 0.0–0.3)
Total Bilirubin: 0.9 mg/dL (ref 0.3–1.2)
Total Protein: 7.2 g/dL (ref 6.0–8.3)

## 2010-10-30 LAB — HM DIABETES FOOT EXAM

## 2010-10-30 LAB — HEMOGLOBIN A1C: Hgb A1c MFr Bld: 7.6 % — ABNORMAL HIGH (ref 4.6–6.5)

## 2010-10-30 LAB — TSH: TSH: 4.55 u[IU]/mL (ref 0.35–5.50)

## 2010-11-01 ENCOUNTER — Ambulatory Visit: Payer: Self-pay | Admitting: Internal Medicine

## 2010-11-08 NOTE — Assessment & Plan Note (Signed)
Summary: ROA f/u JRR   Vital Signs:  Patient profile:   77 year old male Weight:      219 pounds Temp:     97.7 degrees F oral Pulse rate:   64 / minute Pulse rhythm:   regular BP sitting:   124 / 68  (left arm) Cuff size:   large  Vitals Entered By: Mervin Hack CMA Duncan Dull) (October 30, 2010 10:13 AM) CC: follow-up visit   History of Present Illness: Doing well  Had been having some orthostatic hypotension Better since changed to tamsulosin  Voiding okay Nocturia 2-3 still No sig daytime problems  Checks sugars a couple of times a week 100-120 in general fasting No hypoglycemia since on 1/2 tab  No palpitations No chest pain Breathing has been okay Not overly active but no recent changes  Allergies: No Known Drug Allergies  Past History:  Past medical, surgical, family and social histories (including risk factors) reviewed for relevance to current acute and chronic problems.  Past Medical History: Reviewed history from 12/23/2009 and no changes required. Diabetes mellitus, type II GERD Hypertension Osteoarthritis Benign prostatic hypertrophy Tachycardia------------------------------------------- Dr [place]  Past Surgical History: Reviewed history from 12/23/2009 and no changes required. EGD with dilation  2001 Back surgery  2001 Shoulder surgery (left rotator cuff)  2005  Family History: Reviewed history from 12/23/2009 and no changes required. Dad died @73    stroke, PVD (amputation) Mom died @92   CHF, CAD with multiple MI 5 brothers--2 died (COPD, heart//// some cancer) 4 sisters--1 sister died (lung cancer,etc) No colon or prostate cancer DM in nephew (IDDM)  Social History: Reviewed history from 12/23/2009 and no changes required. 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 1 son died in MVA Former Smoker--quit after just a few years Alcohol use-no  Review of Systems       appetite is fine New teeth put  in weight up a few pounds Sleeps fine despite the nocturia  Physical Exam  General:  alert and normal appearance.   Neck:  supple, no masses, no thyromegaly, and no cervical lymphadenopathy.   Lungs:  normal respiratory effort, no intercostal retractions, no accessory muscle use, and normal breath sounds.   Heart:  normal rate, regular rhythm, no murmur, and no gallop.   Msk:  no joint tenderness and no joint swelling.   Pulses:  1+ in feet Extremities:  no edema Skin:  no suspicious lesions and no ulcerations.   Psych:  normally interactive, good eye contact, not anxious appearing, and not depressed appearing.    Diabetes Management Exam:    Foot Exam (with socks and/or shoes not present):       Sensory-Pinprick/Light touch:          Left medial foot (L-4): normal          Left dorsal foot (L-5): normal          Left lateral foot (S-1): normal          Right medial foot (L-4): normal          Right dorsal foot (L-5): normal          Right lateral foot (S-1): normal       Sensory-Monofilament:          Right foot: diminished       Inspection:          Left foot: normal          Right foot: abnormal  Comments: chronic plantar callous       Nails:          Left foot: fungal infection          Right foot: fungal infection   Impression & Recommendations:  Problem # 1:  DIABETES MELLITUS, TYPE II (ICD-250.00) Assessment Unchanged  still with good control no sig hypoglycemia on reduced dose  His updated medication list for this problem includes:    Ramipril 5 Mg Caps (Ramipril) .Marland Kitchen... Take 1 by mouth once daily    Glimepiride 4 Mg Tabs (Glimepiride) .Marland Kitchen... Take 1/2  by mouth once daily    Aspirin 325 Mg Tabs (Aspirin) .Marland Kitchen... Take 1 by mouth once daily  Labs Reviewed: Creat: 1.32 (12/23/2009)     Last Eye Exam: normal (01/16/2010) Reviewed HgBA1c results: 7.1 (05/19/2010)  7.2 (12/23/2009)  Orders: TLB-A1C / Hgb A1C (Glycohemoglobin) (83036-A1C)  Problem #  2:  HYPERTENSION (ICD-401.9) Assessment: Unchanged  good control no changes needed due for labs His updated medication list for this problem includes:    Ramipril 5 Mg Caps (Ramipril) .Marland Kitchen... Take 1 by mouth once daily    Metoprolol Succinate 50 Mg Xr24h-tab (Metoprolol succinate) .Marland Kitchen... Take 1 by mouth once daily    Triamterene-hctz 37.5-25 Mg Tabs (Triamterene-hctz) .Marland Kitchen... Take 1/2  by mouth once daily  BP today: 124/68 Prior BP: 163/82 (05/19/2010)  Labs Reviewed: K+: 4.5 (12/23/2009) Creat: : 1.32 (12/23/2009)   Chol: 151 (12/23/2009)   HDL: 33 (12/23/2009)   LDL: 81 (12/23/2009)   TG: 184 (12/23/2009)  Orders: TLB-Renal Function Panel (80069-RENAL) TLB-CBC Platelet - w/Differential (85025-CBCD) TLB-Hepatic/Liver Function Pnl (80076-HEPATIC) TLB-TSH (Thyroid Stimulating Hormone) (84443-TSH) Venipuncture (16109)  Problem # 3:  BENIGN PROSTATIC HYPERTROPHY (ICD-600.00) Assessment: Unchanged doing well on tamsulosin dizziness gone off the doxazosin  Problem # 4:  TACHYCARDIA (ICD-785.0) Assessment: Unchanged controlled with metoprolol  Complete Medication List: 1)  Pantoprazole Sodium 40 Mg Tbec (Pantoprazole sodium) .... Take 1 by mouth once daily 2)  Ramipril 5 Mg Caps (Ramipril) .... Take 1 by mouth once daily 3)  Metoprolol Succinate 50 Mg Xr24h-tab (Metoprolol succinate) .... Take 1 by mouth once daily 4)  Triamterene-hctz 37.5-25 Mg Tabs (Triamterene-hctz) .... Take 1/2  by mouth once daily 5)  Flomax 0.4 Mg Caps (Tamsulosin hcl) .... Take one by mouth daily 6)  Glimepiride 4 Mg Tabs (Glimepiride) .... Take 1/2  by mouth once daily 7)  Aspirin 325 Mg Tabs (Aspirin) .... Take 1 by mouth once daily  Patient Instructions: 1)  Please schedule a follow-up appointment in 6 months for physical   Orders Added: 1)  Est. Patient Level IV [60454] 2)  TLB-A1C / Hgb A1C (Glycohemoglobin) [83036-A1C] 3)  TLB-Renal Function Panel [80069-RENAL] 4)  TLB-CBC Platelet -  w/Differential [85025-CBCD] 5)  TLB-Hepatic/Liver Function Pnl [80076-HEPATIC] 6)  TLB-TSH (Thyroid Stimulating Hormone) [84443-TSH] 7)  Venipuncture [09811]    Current Allergies (reviewed today): No known allergies

## 2010-12-04 LAB — GLUCOSE, CAPILLARY
Glucose-Capillary: 115 mg/dL — ABNORMAL HIGH (ref 70–99)
Glucose-Capillary: 132 mg/dL — ABNORMAL HIGH (ref 70–99)

## 2010-12-13 ENCOUNTER — Other Ambulatory Visit: Payer: Self-pay | Admitting: Internal Medicine

## 2011-01-22 ENCOUNTER — Encounter: Payer: Self-pay | Admitting: Internal Medicine

## 2011-01-22 ENCOUNTER — Encounter: Payer: Self-pay | Admitting: Family Medicine

## 2011-01-22 ENCOUNTER — Ambulatory Visit (INDEPENDENT_AMBULATORY_CARE_PROVIDER_SITE_OTHER): Payer: Medicare Other | Admitting: Family Medicine

## 2011-01-22 DIAGNOSIS — T148 Other injury of unspecified body region: Secondary | ICD-10-CM

## 2011-01-22 DIAGNOSIS — W57XXXA Bitten or stung by nonvenomous insect and other nonvenomous arthropods, initial encounter: Secondary | ICD-10-CM

## 2011-01-22 NOTE — Assessment & Plan Note (Signed)
Discussed things to watch out for tick born illness and within 1-1 1/2 wks.  If any sxs, call for abx course. Removed entire tick parts.  Pt tolerated well. Handout on tick bites provided.

## 2011-01-22 NOTE — Progress Notes (Signed)
  Subjective:    Patient ID: Adrian Singh, male    DOB: 1934/04/20, 77 y.o.   MRN: [id number]  HPI CC: tick removal  Noted tick on posterior L shoulder this morning.  Removed head but still part left.  Thinks there since Saturday.  Unsure what kind of tick it was.  Had white spot.  No fevers/chills, nausea, vomiting, HA, abd pain, rashes, myalgias, stiff neck.  Review of Systems Per HPI    Objective:   Physical Exam NAD L post upper arm with bite mark with tick jaws remaining.    Area cleaned with EtOH, tick jaws removed with forceps.  Area dressed with abx and bandaid.  Pt tolerated well.   Assessment & Plan:

## 2011-01-22 NOTE — Patient Instructions (Signed)
Tick Bites Ticks are insects that attach themselves to the skin. Most tick bites are harmless, but sometimes ticks carry diseases that can make a person quite ill. The chance of getting ill depends on:  The kind of tick that bites you.   Time of year.   How long the tick is attached.   Geographic location.  Wood ticks are also called dog ticks. They are generally black. They can have white markings. They live in shrubs and grassy areas. They are larger than deer ticks. Wood ticks are about the size of a watermelon seed. They have a hard body. The most common places for ticks to attach themselves are the scalp, neck, armpits, waist, and groin. Wood tics may stay attached for up to 2 weeks. TICKS MUST BE REMOVED AS SOON AS POSSIBLE TO HELP PREVENT DISEASES CAUSED BY TICK BITES.  TO REMOVE A TICK: 1. If available, put on latex gloves before trying to remove a tick.  2. Grasp the tick as close to the skin as possible, with curved forceps, fine tweezers or a special tick removal tool.  3. Pull gently with steady pressure until the tick lets go. Do not twist the tick or jerk it suddenly. This may break off the tick's head or mouth parts.  4. Do not crush the tick's body. This could force disease-carrying fluids from the tick into your body.  5. After the tick is removed, wash the bite area and your hands with soap and water or other disinfectant.  6. Apply a small amount of antiseptic cream or ointment to the bite site.  7. Wash and disinfect any instruments that were used.  8. Save the tick in a jar or plastic bag for later identification. Preserve the tick with a bit of alcohol or put it in the freezer.  9. Do not apply a hot match, petroleum jelly, or fingernail polish to the tick. This does not work and may increase the chances of disease from the tick bite.  YOU MAY NEED TO SEE YOUR CAREGIVER FOR A TETANUS SHOT NOW IF:  You have no idea when you had the last one.   You have never had a  tetanus shot before.  If you need a tetanus shot, and you decide not to get one, there is a rare chance of getting tetanus. Sickness from tetanus can be serious. If you get a tetanus shot, your arm may swell, get red and warm to the touch at the shot site. This is common and not a problem. TO PREVENT TICK BITES WHEN IN A TICK INFESTED AREA:  Wear protective clothing. Long sleeves and pants are best.   Wear white clothes to see ticks more easily   Tuck your pant legs into your socks.   If walking on trail, stay in the middle of the trail to avoid brushing against bushes.   Put insect repellent on all exposed skin and along boot tops, pant legs and sleeve cuffs   Check clothing, hair and skin repeatedly and before coming inside.   Brush off any ticks that are not attached.  SEEK MEDICAL CARE IF:  You cannot remove a tick or part of the tick that is left in the skin.   Unexplained fever.   Redness and swelling in the area of the tick bite.   Tender, swollen lymph glands.   Diarrhea.   Weight loss.   Cough.   Fatigue.   Muscle, joint or bone pain.   Belly  pain.   Headache.   Rash.  SEEK IMMEDIATE MEDICAL CARE IF:  You develop an oral temperature above 101.   You are having trouble walking or moving your legs.   Numbness in the legs.   Shortness of breath.   Confusion.   Repeated vomiting.  Document Released: 08/31/2000 Document Re-Released: 08/16/2008 [facility] Patient Information 2011 [place], [place].

## 2011-01-30 NOTE — Discharge Summary (Signed)
NAME:  Adrian Singh, HARRIOTT NO.:  [id number]   MEDICAL RECORD NO.:  [id number]          PATIENT TYPE:  INP   LOCATION:  4703                         FACILITY:  [facility]   PHYSICIAN:  Peter M. Swaziland, M.D.  DATE OF BIRTH:  [birth year]   DATE OF ADMISSION:  01/05/2008  DATE OF DISCHARGE:  01/07/2008                               DISCHARGE SUMMARY   HISTORY OF PRESENT ILLNESS:  Adrian Singh is a 77 year old white male who  presents with history of intermittent chest pain on exertion and  intermittent palpitations.  He had had a stress echo performed in 2000  which was normal and a nuclear stress test approximately 4 years ago  which was also normal.  He does have a history of hypertension and  diabetes mellitus type 2.  The patient had a sudden onset of rapid heart  rate on the afternoon of admission.  It was a regular and associated  with left-sided chest pain.  On arrival to the emergency room, he was  found to be in atrial fibrillation with rapid ventricular response.  He  also had a mildly elevated troponin of 0.09.  He received IV Lopressor  and IV nitroglycerin, converted to normal sinus rhythm, and was  subsequently asymptomatic.   For details of his past medical history, social history, family history,  and physical exam, please see admission history and physical.   LABORATORY DATA:  ECG on admission showed atrial fibrillation with rapid  ventricular response with a rate of 140.  There were no significant ST  or T-wave changes.  Question of small Q-waves inferiorly.  Chest x-ray  showed no active disease.  Hemoglobin was 15, hematocrit 44.0, white  count 9000, and platelets 132,000.  Sodium 140, potassium 3.7, chloride  103, CO2 25, BUN 17, creatinine 1.18, and glucose of 89.  Liver function  studies were normal.  Coags were normal while his initial troponin was  elevated at 0.09.  All subsequent serial cardiac enzymes were normal.  Cholesterol was 133, HDL was 28, LDL  87, and triglycerides of 91.  Urinalysis was negative.  TSH was 3.605.   HOSPITAL COURSE:  The patient was admitted to telemetry monitoring.  As  noted his other cardiac enzymes were negative.  His subsequent ECG was  unremarkable.  Because of his cardiac risk factors and recent symptoms  of chest pain, he did undergo cardiac catheterization on January 07, 2008.  This demonstrated a 40% stenosis in the mid LAD.  He had minor  irregularities in the left circumflex and right coronary artery.  His  ejection fraction was normal at 60%.  He was started on Altace and beta  blocker.  His Procardia and triamterene were discontinued.  He was  continued on his usual diabetic care.  During the remainder of his  hospital stay, he had no further arrhythmia.  He had no other  complaints.  He had no groin complications post procedure.  An  echocardiogram was pending at the time of discharge.  The patient was  felt to be stable for discharge at this point.  DISCHARGE DIAGNOSES:  1. Atrial fibrillation with rapid ventricular response.  2. Angina pectoris secondary to atrial fibrillation with rapid      ventricular response.  The patient has nonobstructive coronary      artery disease.  3. Diabetes mellitus type 2.  4. Hypertension.  5. Previous orthopedic surgeries.  6. Low HDL cholesterol.   DISCHARGE MEDICATIONS:  1. Januvia 100 mg daily.  2. Doxazosin 2 mg daily.  3. Hydrocodone p.r.n.  4. Protonix 40 mg per day.  5. Glimepiride 2 mg one-half to one-half tablet daily.  6. Coated aspirin 325 mg per day.  7. Toprol-XL 50 mg per day.  8. Ramipril 60 mg per day.   The patient is instructed to stop his Procardia and triamterene.  The  patient will gradually resume normal activities.  He will follow up with  Dr. Swaziland in 1 week.  Discharge status is improved.           ______________________________  Peter M. Swaziland, M.D.     PMJ/MEDQ  D:  01/07/2008  T:  01/08/2008  Job:  [id number]   cc:    Holley Bouche, M.D.

## 2011-01-30 NOTE — Cardiovascular Report (Signed)
NAME:  Adrian Singh, BERKA NO.:  [id number]   MEDICAL RECORD NO.:  [id number]          PATIENT TYPE:  INP   LOCATION:  4703                         FACILITY:  [facility]   PHYSICIAN:  Peter M. Swaziland, M.D.  DATE OF BIRTH:  Nov 13, 1933   DATE OF PROCEDURE:  DATE OF DISCHARGE:                            CARDIAC CATHETERIZATION   INDICATIONS FOR PROCEDURE:  A 77 year old white male presented with  atrial fibrillation and symptoms consistent with unstable angina.  He  has a history of diabetes, hypertension, and family history of early  coronary disease.   PROCEDURE:  Left heart catheterization, coronary and left ventricular  angiography.  Equipment used 6-French 4 cm right and left Judkins  catheter, 6-French pigtail catheter, 6-French arterial sheath.  Access  via the right femoral artery using the standard Seldinger technique.   MEDICATIONS:  Local anesthesia 1% lidocaine.  Contrast 100 mL of  Omnipaque.   HEMODYNAMIC DATA:  Aortic pressure is 109/59 with a mean of 80 mmHg,  left ventricle pressure was 105 with EDP of 15 mmHg.   ANGIOGRAPHIC DATA:  The left coronary arises and distributes normally.  The left main coronary appears normal.   The left anterior descending artery has a 40% narrowing in the mid  vessel that is focal.   The left circumflex coronary artery has minor irregularities throughout  its course, less than 10%.   The right coronary is a dominant vessel.  It has a 20% narrowing  proximally.  Otherwise, no obstructive disease.   The left ventricular angiography was performed in the RAO view.  This  demonstrates normal left ventricular size and contractility with normal  systolic function.  Ejection fraction is estimated at 60%.  There are no  significant wall motion abnormalities.   FINAL INTERPRETATION:  1. Mild nonobstructive atherosclerotic coronary artery disease.  2. Normal left ventricular function.   PLAN:  Would recommend continued  medical therapy.          ______________________________  Peter M. Swaziland, M.D.    PMJ/MEDQ  D:  01/06/2008  T:  01/07/2008  Job:  [id number]   cc:   Holley Bouche, M.D.

## 2011-01-30 NOTE — H&P (Signed)
NAME:  Adrian Singh, Adrian Singh NO.:  [id number]   MEDICAL RECORD NO.:  [id number]          PATIENT TYPE:  EMS   LOCATION:  MAJO                         FACILITY:  [facility]   PHYSICIAN:  Rollene Rotunda, MD, FACCDATE OF BIRTH:  06-23-1934   DATE OF ADMISSION:  01/05/2008  DATE OF DISCHARGE:                              HISTORY & PHYSICAL   PRIMARY CARE PHYSICIAN:  Holley Bouche, M.D.   CARDIOLOGIST:  Peter M. Swaziland, M.D.   REASON FOR PRESENTATION:  Chest pain and palpitation.   HISTORY OF PRESENT ILLNESS:  The patient is a very pleasant 77 year old  white gentleman with a history of intermittent palpitations and chest  discomfort.  He saw Dr. Swaziland some years ago and reports what sounds  like an echocardiogram and a nuclear stress test.  Apparently these were  negative for any significant abnormalities.  Has had occasional  palpitations of the years but these have been short-lived.  He has had  chest discomfort with exertion that has been stable pattern over the  years.   Today, at rest at about 4 p.m., he developed a rapid heart rate.  It was  quite irregular.  With this he felt it beating in his and down into his  neck.  He felt short of breath.  He had some left-sided upper chest  discomfort.  He felt lightheaded but did not having presyncope or  syncope.  No radiation of discomfort his jaw or to his arms.  The  symptoms did not abate and he came to the emergency room at 6 p.m. where  he was found to be atrial fibrillation with a rapid rate.  There were no  acute ST-segment changes.  Point-of-care markers did peak with a  troponin of 0.09.  He was treated with IV nitroglycerin.  He was given 5  mg IV Lopressor.  He had improvement in his heart rate which initially  was in the 130's apparently.  He has now had resolution of any chest  discomfort.  His breathing is improved.   The patient otherwise been well.  He is able to do some yard work.  He  may get a little  fatigued with this.  He may occasionally get a little  chest discomfort, but this is not very reproducible and has been a  stable pattern.  He sleeps chronically on pillows because of hiatal  hernia, but does not have any PND or orthopnea.  He has no fevers or  chills.  He is had no weight gain or swelling.   PAST MEDICAL HISTORY:  1. Hypertension times 20 years.  2. Diabetes mellitus x2 years.   SURGICAL HISTORY:  Shoulder surgery, lumbar back surgery.   ALLERGIES/INTOLERANCES:  None.   MEDICATIONS:  (The patient did not bring a list).  Januvia (question the  dose) and second diabetes medication, Maxzide, prostate pill, blood  pressure pill.   SOCIAL HISTORY:  The patient is married.  He is retired.  He quit  smoking many years ago.   FAMILY HISTORY:  Family history is contributory for brother having  bypass in  his 55s.  His father had myocardial function and died his 67s.  He has a brother with heart valve disease.  No early onset heart disease  in first-degree relatives, however.   REVIEW OF SYSTEMS:  As stated in the HPI, otherwise negative for all  other systems except for some mild ankle swelling chronically.   PHYSICAL EXAMINATION:  GENERAL:  The patient is pleasant and in no  distress.  VITAL SIGNS:  Blood pressure 112/72, heart rate 90s and irregular,  afebrile, respiratory rate rate 16.  HEENT:  Eyes unremarkable.  Pupils equal and reactive to light.  Fundi  not visualized, oral mucosa unremarkable, edentulous.  NECK:  No jugular distention at 45 degrees, carotid upstroke brisk and  symmetric, no bruits, no thyromegaly.  LYMPHATICS:  No cervical, axillary, inguinal adenopathy.  LUNGS:  Clear to auscultation bilaterally.  BACK:  No costovertebral angle tenderness.  CHEST:  Unremarkable.  HEART:  PMI not displaced or sustained, S1 and S2 within normal.  No S3,  no murmurs.  ABDOMEN:  Flat, positive bowel sounds,  normal in frequency and pitch.  No bruits, rebound,  guarding, no midline pulsatile mass, no hepatomegaly  and no splenomegaly.  SKIN:  No rashes, no nodules.  EXTREMITIES:  2+ pulse throughout.  No edema, no cyanosis or clubbing.  NEUROLOGIC:  Oriented to person, place and time.  Cranial nerves II-XII  grossly intact, motor grossly intact.   LABORATORY DATA:  EKG shows atrial fibrillation with rapid ventricular  rate.  Axis within normal limits.  Cannot exclude old inferior infarct,  early transition lead V2, no acute ST-T wave changes.   ASSESSMENT/PLAN:  1. Atrial fibrillation.  The patient has atrial fibrillation with      rapid rate.  I believe the onset to be this afternoon.  He is quite      symptomatic.  He slowed with IV Lopressor.  I will treat him with      p.o. Lopressor.  I will start him on aspirin and heparin.  He will      need an echocardiogram.  He will most likely need long-term      anticoagulation and perhaps cardioversion if he does not      spontaneously convert.  2. Chest discomfort.  The patient had chest discomfort with his rapid      rate.  He has had a slight troponin elevation.  He has      cardiovascular risk factors.  He has an abnormal EKG with possible      old inferior infarct.  Given all of this, I think the pretest      probability of obstructive coronary disease is high and he should      have a cardiac catheterization.  I will keep him n.p.o..  I will      defer to Dr. Swaziland and will put precath orders on the chart.  3. Diabetes.  He does not know any of his oral medications.  I will      ask his wife to call back with these and they should be restarted.      He will be covered with sliding scale insulin.  4. Risk reduction.  Will get a lipid profile in the morning.Rollene Rotunda, MD, [facility]  Electronically Signed     JH/MEDQ  D:  01/06/2008  T:  01/06/2008  Job:  [id number]   cc:   Holley Bouche, M.D.  Peter M. Swaziland, M.D.

## 2011-02-02 NOTE — Op Note (Signed)
[place]. [facility]  Patient:    Adrian Singh                        MRN: [id number] Proc. Date: 09/26/99 Adm. Date:  [id number] Attending:  Maryanna Shape                           Operative Report  PREOPERATIVE DIAGNOSIS:  Stenosis at L3-4 and L4-5.  POSTOPERATIVE DIAGNOSIS:  Stenosis at L3-4 and L4-5.  OPERATION:  L4 laminectomy complete.  Bilateral lateral decompression to include foraminotomies and exposure of each L3 and L4 nerve root bilaterally.  SURGEON:  Reynolds Bowl, M.D.  ASSISTANT:  Humberto Leep. Wyonia Hough, M.D.  This was done by microscope.  DESCRIPTION OF PROCEDURE:  The patient was given general anesthetic and placed n the Hastings frame, prepped and draped in the usual manner for sterility.  I then used #18 gauge spinal needles to identify the interspaces and confirmed it by x-ray and based the incision on that.  The L4 posterior spine and lamina was completely exposed by subperiosteal elevations, paraspinal muscles and insertion of a self  retaining retractor. I exposed the intervening ligamentum flavum between 3 and  bilaterally and 4 and 5 bilaterally.  Then did a complete laminectomy of L4 and  carried the dissection up to remove all of the remaining L3-4 thick redundant ligamentum flavum and part of the under border of the L3 lamina including some f the L3 spine distally.  This was carried out then laterally where the very large overhung, large facet and facet synovial tissue.  This was excised through multiple bites out to near the level of the lateral wall and the pedicle.  This required  going around in a circle at least twice progressively expanding the dissection nd we carried it out almost to the side wall, identified each pedicle, identified ach nerve root and released adhesions.  We did foraminotomies and distally the same  findings were exposed as proximally.  That is very thickened facets,  thickened redundant ligament flavum and facet synovium.  I at completion then was able to see the L3 nerve root bilaterally and follow it out to foramen which was freed and he L4 nerve root, followed that out to the foramen which was free.  Having done that, the area was irrigated.  Retractor removed. The lumbar fascia was approximated ith multiple #1 Vicryl sutures, subcutaneous was approximated with 2-0 Vicryl and the skin edges held in apposition with staples.  A Band-Aid was applied.  The patient was returned to the recovery room.  Estimated blood loss was probably 100 cc.  None was replaced.  I used the bipolar cautery intermittently await from the nerve roots when necessary. DD:  09/26/99 TD:  09/26/99 Job: [id number]

## 2011-02-02 NOTE — Op Note (Signed)
NAME:  Adrian Singh, Adrian Singh                          ACCOUNT NO.:  [id number]   MEDICAL RECORD NO.:  [id number]                   PATIENT TYPE:  AMB   LOCATION:  DSC                                  FACILITY:  [facility]   PHYSICIAN:  Vanita Panda. Magnus Ivan, M.D.       DATE OF BIRTH:  [birth year]   DATE OF PROCEDURE:  05/19/2004  DATE OF DISCHARGE:                                 OPERATIVE REPORT   PREOPERATIVE DIAGNOSIS:  Left shoulder impingement syndrome.   PROCEDURE:  Left shoulder arthroscopy with debridement and subacromial  decompression.   SURGEON:  Vanita Panda. Magnus Ivan, M.D.   ANESTHESIA:  Interscalene block with general anesthesia.   COMPLICATIONS:  None.   ESTIMATED BLOOD LOSS:  Minimal.   FLUIDS REPLACED:  1300 mL of normal saline.   INDICATIONS:  Briefly, Adrian Singh is a 77 year old right-hand dominant male  with a longstanding history of left shoulder pain.  He had had several  injections in his shoulder by other physicians and had also been through  physical therapy and a long course of nonsteroidal anti-inflammatory  medications.  After continued pain in the shoulder, an MRI was obtained  which certainly showed undersurface tear, partial tearing of the rotator  cuff with mainly tendinopathy and tendinitis of the cuff as well as bursitis  in the subacromial space.  It was recommended that he undergo arthroscopy  given his failed conservative treatment.   PROCEDURE DESCRIPTION:  Before being brought to the operating room,  interscalene block was obtained by anesthesia.  He was then brought to the  operating room and placed supine on the operating table.  General anesthesia  was obtained and then he was placed in a beach chair position with his left  arm in traction with 10 pounds.  The left arm and shoulder were prepped and  draped with Duraprep and sterile drapes.  First a posterior portal was made  sharply with a knife and the arthroscope was inserted posteriorly.  Diagnostic tour of the glenohumeral joint was taken, and there was noted  certainly undersurface tearing of the rotator cuff but this was only partial  thickness.  The biceps tendon and the subscapularis tendons were intact.  There was noted wear on the glenoid as well as the humeral head, but the  glenoid labrum was intact as well.  Using a Wissinger rod, an anterior  portal was made just lateral to the coracoid.  The arthroscopic shaver was  inserted through this area and then glenohumeral debridement was performed  with debriding the undersurface tear.  There was slight fraying along the  biceps tendon that was debrided as well, but otherwise the debridement was  minimal in the glenohumeral joint.  Next the arthroscope was inserted  through the posterior portal up into the subacromial space.  A lateral  portal just off the lateral edge of the acromion was made sharply with a  knife and the shaver was put into the  subacromial space.  A subacromial  decompression was performed, including undersurface shaving of the clavicle  and the acromion.  A complete bursectomy was performed as well.  The  shoulder was put through a range of motion and the rotator cuff was found  to be intact.  The arthroscope was removed and then the portal sites were  cleaned, single 3-0 nylon sutures were placed in each portal site, and then  sterile dressings were applied.  The patient was awakened, extubated, and  taken to the recovery room in stable condition.                                               Vanita Panda. Magnus Ivan, M.D.    CYB/MEDQ  D:  05/19/2004  T:  05/20/2004  Job:  [id number]

## 2011-02-02 NOTE — Procedures (Signed)
[place]. [facility]  Patient:    Adrian Singh                        MRN: [id number] Proc. Date: 10/20/99 Adm. Date:  [id number] Attending:  Mingo Amber CC:         Arvella Merles, M.D.                           Procedure Report  PROCEDURES PERFORMED:  Video upper endoscopy and video colonoscopy.  ENDOSCOPIST:  Roosvelt Harps, M.D.  INDICATIONS FOR PROCEDURE:  A 77 year old male with dysphagia, heartburn, melena and hemoglobin which dropped from 13.5 to 9.5.  It should be noted that MCV is normal.  Helicobacter pylori antibody test is negative and liver function tests are normal.  PREPARATION:  He is n.p.o. since midnight having taken Phospho-Soda prep and a clear liquid diet.  The mucosa throughout is clean.  PREPROCEDURE SEDATION:  Prior to the upper endoscopy he received a total of 50 g of Demerol and 6 mg of Versed.  In addition, his throat was anesthetized with Hurricaine spray and he was on 2L of nasal cannula oxygen.  DESCRIPTION OF PROCEDURE:  The Olympus video upper endoscope was inserted via the mouth and advanced easily through the upper esophageal sphincter.  Intubation was then carried out well into the descending duodenum.  On withdrawal, the mucosa as carefully evaluated.  The descending duodenum and bulb appeared normal as did the antrum and body of the stomach.  There was laxity of the lower esophageal sphincter on retroflex view of the LES and on withdrawal through this area it became apparent he had an approximately 3 to 4 cm hiatal hernia with a nonocclusive ring at the  proximal margin.  An area of mild inflammation just proximal to this ring was biopsied to rule out Barretts.  The endoscope was reinserted to the antrum.  A guide wire was placed and as the scope was withdrawn, the guide wire was maintained in position.  Numbers 14 mm and 15 mm Savary dilators were passed over the guide wire with minimal if  any resistance.  There was no blood on the dilators as they were withdrawn.  The patient tolerated the procedure well.  At its conclusion, is position was reversed for procedure #2, video colonoscopy.  He received an additional 25 mg of Demerol and 2 mg of Versed intravenously. The Olympus video colonoscope was inserted via the rectum and advanced easily all the way to the cecum.  There was a minimal amount of stool present in the cecum which was irrigated clean.  Cecal landmarks were identified and photographed.  On withdrawal, the mucosa was carefully evaluated.  At 60 cm from the rectum was a  diminutive polyp removed with hot biopsy forceps.  Other than that, the colonoscopy was entirely normal.  There were no areas of inflammation, diverticulosis or other neoplasia.  Retroflex view of the rectum was unremarkable.  The patient tolerated both procedures well.  Pulse, blood pressure and oximetry testing were stable throughout.  He was observed in recovery for 45 minutes and discharged home alert with a benign abdomen.  IMPRESSION:  Recent melena likely was due to gastritis from nonsteroidals.  PLAN:  He is to stay on Prevacid once daily and return to the office within two  weeks for repeat hemoglobin check.  I would like to also assess his  swallowing nd dysphagia and symptoms of reflux on the Prevacid at that time.  I anticipate he  will not have any further problems. DD:  10/20/99 TD:  10/21/99 Job: [id number]

## 2011-03-09 ENCOUNTER — Other Ambulatory Visit: Payer: Self-pay | Admitting: *Deleted

## 2011-03-09 MED ORDER — TRIAMTERENE-HCTZ 37.5-25 MG PO TABS: 0.5000 | ORAL_TABLET | Freq: Every day | ORAL | Status: DC

## 2011-03-09 NOTE — Telephone Encounter (Signed)
rx sent to pharmacy by e-script  

## 2011-05-07 ENCOUNTER — Encounter: Payer: Self-pay | Admitting: Internal Medicine

## 2011-05-07 ENCOUNTER — Ambulatory Visit (INDEPENDENT_AMBULATORY_CARE_PROVIDER_SITE_OTHER): Payer: Medicare Other | Admitting: Internal Medicine

## 2011-05-07 VITALS — BP 134/80 | HR 68 | Temp 97.7°F | Ht 70.0 in | Wt 210.5 lb

## 2011-05-07 DIAGNOSIS — Z0000 Encounter for general adult medical examination without abnormal findings: Secondary | ICD-10-CM

## 2011-05-07 DIAGNOSIS — J069 Acute upper respiratory infection, unspecified: Secondary | ICD-10-CM

## 2011-05-07 DIAGNOSIS — N400 Enlarged prostate without lower urinary tract symptoms: Secondary | ICD-10-CM

## 2011-05-07 DIAGNOSIS — E119 Type 2 diabetes mellitus without complications: Secondary | ICD-10-CM

## 2011-05-07 DIAGNOSIS — I10 Essential (primary) hypertension: Secondary | ICD-10-CM

## 2011-05-07 DIAGNOSIS — L570 Actinic keratosis: Secondary | ICD-10-CM | POA: Insufficient documentation

## 2011-05-07 LAB — BASIC METABOLIC PANEL
BUN: 23 mg/dL (ref 6–23)
CO2: 28 mEq/L (ref 19–32)
Calcium: 9.2 mg/dL (ref 8.4–10.5)
Chloride: 102 mEq/L (ref 96–112)
Creatinine, Ser: 1.4 mg/dL (ref 0.4–1.5)
GFR: 54.33 mL/min — ABNORMAL LOW (ref >60.00)
Glucose, Bld: 132 mg/dL — ABNORMAL HIGH (ref 70–99)
Potassium: 4.7 mEq/L (ref 3.5–5.1)
Sodium: 140 mEq/L (ref 135–145)

## 2011-05-07 LAB — CBC WITH DIFFERENTIAL/PLATELET
Basophils Absolute: 0 10*3/uL (ref 0.0–0.1)
Basophils Relative: 0.5 % (ref 0.0–3.0)
Eosinophils Absolute: 0 10*3/uL (ref 0.0–0.7)
Eosinophils Relative: 0.6 % (ref 0.0–5.0)
HCT: 42.9 % (ref 39.0–52.0)
Hemoglobin: 14.3 g/dL (ref 13.0–17.0)
Lymphocytes Relative: 23.9 % (ref 12.0–46.0)
Lymphs Abs: 1.6 10*3/uL (ref 0.7–4.0)
MCHC: 33.3 g/dL (ref 30.0–36.0)
MCV: 86.6 fl (ref 78.0–100.0)
Monocytes Absolute: 0.6 10*3/uL (ref 0.1–1.0)
Monocytes Relative: 9.4 % (ref 3.0–12.0)
Neutro Abs: 4.3 10*3/uL (ref 1.4–7.7)
Neutrophils Relative %: 65.6 % (ref 43.0–77.0)
Platelets: 155 10*3/uL (ref 150.0–400.0)
RBC: 4.96 Mil/uL (ref 4.22–5.81)
RDW: 13.6 % (ref 11.5–14.6)
WBC: 6.6 10*3/uL (ref 4.5–10.5)

## 2011-05-07 LAB — HEPATIC FUNCTION PANEL
ALT: 36 U/L (ref 0–53)
AST: 28 U/L (ref 0–37)
Albumin: 4.2 g/dL (ref 3.5–5.2)
Alkaline Phosphatase: 89 U/L (ref 39–117)
Bilirubin, Direct: 0 mg/dL (ref 0.0–0.3)
Total Bilirubin: 0.5 mg/dL (ref 0.3–1.2)
Total Protein: 7.9 g/dL (ref 6.0–8.3)

## 2011-05-07 LAB — MICROALBUMIN / CREATININE URINE RATIO
Creatinine,U: 285.2 mg/dL
Microalb Creat Ratio: 3.4 mg/g (ref 0.0–30.0)
Microalb, Ur: 9.8 mg/dL — ABNORMAL HIGH (ref 0.0–1.9)

## 2011-05-07 LAB — LIPID PANEL
Cholesterol: 126 mg/dL (ref 0–200)
HDL: 30.5 mg/dL — ABNORMAL LOW (ref >39.00)
Total CHOL/HDL Ratio: 4
Triglycerides: 225 mg/dL — ABNORMAL HIGH (ref 0.0–149.0)
VLDL: 45 mg/dL — ABNORMAL HIGH (ref 0.0–40.0)

## 2011-05-07 LAB — TSH: TSH: 1.69 u[IU]/mL (ref 0.35–5.50)

## 2011-05-07 LAB — HEMOGLOBIN A1C: Hgb A1c MFr Bld: 7.4 % — ABNORMAL HIGH (ref 4.6–6.5)

## 2011-05-07 LAB — LDL CHOLESTEROL, DIRECT: Direct LDL: 74 mg/dL

## 2011-05-07 NOTE — Assessment & Plan Note (Signed)
Hopefully still acceptable control Lab Results  Component Value Date   HGBA1C 7.6* 10/30/2010

## 2011-05-07 NOTE — Assessment & Plan Note (Signed)
Doing well Has dropped some weight--stays active Rx for zostavax given UTD on colonoscopy No more PSAs

## 2011-05-07 NOTE — Assessment & Plan Note (Signed)
BP Readings from Last 3 Encounters:  05/07/11 134/80  01/22/11 124/70  10/30/10 124/68   Good control  No changes needed

## 2011-05-07 NOTE — Assessment & Plan Note (Signed)
Rx done with liquid nitrogen 45 seconds x 2 Discussed home care

## 2011-05-07 NOTE — Assessment & Plan Note (Addendum)
Seems like viral URI He will call if symptoms progress to apparent secondary bacterial infection

## 2011-05-07 NOTE — Progress Notes (Signed)
Subjective:    Patient ID: Adrian Singh, male    DOB: [birth year], 77 y.o.   MRN: [id number]  HPI Doing okay in general Has had a cold for over a week Congestion in chest Using over the counter meds Head "feels like I am in a barrel" Ears are stuffed up No fever No sweats Some cough--occ mucus No SOB Some sore throat--using listerine with some relief May be some better today  Checks sugars 1-2 times per week No dizziness or weak feelings Generally 120-140 or so Due for eye exam in next month or so  Has spot on right ear he needs checked Recurrent knot (cyst) on vertex  Current Outpatient Prescriptions on File Prior to Visit  Medication Sig Dispense Refill  . aspirin 325 MG tablet Take 325 mg by mouth daily.        Marland Kitchen glimepiride (AMARYL) 4 MG tablet Take 2 mg by mouth daily before breakfast.        . metoprolol (TOPROL-XL) 50 MG 24 hr tablet Take 50 mg by mouth daily.        . pantoprazole (PROTONIX) 40 MG tablet TAKE 1 TABLET BY MOUTH EVERY DAY  90 tablet  2  . ramipril (ALTACE) 5 MG capsule Take 5 mg by mouth daily.        . Tamsulosin HCl (FLOMAX) 0.4 MG CAPS Take 0.4 mg by mouth daily.        Marland Kitchen triamterene-hydrochlorothiazide (MAXZIDE-25) 37.5-25 MG per tablet Take 0.5 tablets by mouth daily.  90 tablet  3    No Known Allergies  Past Medical History  Diagnosis Date  . Hypertrophy of prostate without urinary obstruction and other lower urinary tract symptoms (LUTS)   . Type II or unspecified type diabetes mellitus without mention of complication, not stated as uncontrolled   . Esophageal reflux   . Unspecified essential hypertension   . Osteoarthrosis, unspecified whether generalized or localized, unspecified site   . Plantar wart   . Tachycardia, unspecified     Dr. Swaziland    Past Surgical History  Procedure Date  . Esophagogastroduodenoscopy 2001    with dilation  . Back surgery 2001  . Rotator cuff repair 2005    Left    Family History  Problem  Relation Age of Onset  . Stroke Father   . Peripheral vascular disease Father     amputation  . Heart failure Mother     CHF  . Coronary artery disease Mother   . Heart attack Mother     Multiple  . COPD Brother   . Heart disease Brother   . Cancer Brother     ?  . Lung cancer Sister     History   Social History  . Marital Status: Married    Spouse Name: N/A    Number of Children: 4  . Years of Education: N/A   Occupational History  . 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 Smoking status: Former Games developer  . Smokeless tobacco: Never Used   Comment: Quit after just a few years  . Alcohol Use: No  . Drug Use: No  . Sexually Active: Not on file   Other Topics Concern  . Not on file   Social History Narrative  . No narrative on file   Review of Systems  Constitutional: Negative for fatigue and unexpected weight change.       Has been working outside a lot [employer]  down 10# Wears seat belt  HENT: Positive for hearing loss, congestion, rhinorrhea and tinnitus. Negative for dental problem.        Has full dentures  Eyes: Negative for visual disturbance.       No diplopia or unilateral vision loss occ brief blurry vision--none in past month  Respiratory: Positive for cough. Negative for chest tightness and shortness of breath.   Cardiovascular: Negative for chest pain, palpitations and leg swelling.       No palpitations since on beta blocker  Gastrointestinal: Negative for nausea, vomiting, abdominal pain, constipation and blood in stool.       Reflux controlled with med   Genitourinary: Negative for urgency, frequency and difficulty urinating.       No sexual problems  Musculoskeletal: Positive for back pain. Negative for joint swelling and arthralgias.       Tries to do regular back exercises  Skin: Negative for rash.       See HPI  Neurological: Positive for dizziness and numbness. Negative for syncope, weakness, light-headedness and  headaches.       Numbness down right lateral leg if stands still--chronic from his back occ slight dizziness upon first standing up  Hematological: Negative for adenopathy. Does not bruise/bleed easily.  Psychiatric/Behavioral: Negative for sleep disturbance and dysphoric mood. The patient is not nervous/anxious.        Objective:   Physical Exam  Constitutional: He is oriented to person, place, and time. He appears well-developed and well-nourished. No distress.  HENT:  Head: Atraumatic.  Mouth/Throat: Oropharynx is clear and moist. No oropharyngeal exudate.       Mild nasal congestion Left TM obscurred with cerumen Slight redness in inferior part of right TM  Eyes: Conjunctivae and EOM are normal. Pupils are equal, round, and reactive to light.  Neck: Normal range of motion. Neck supple. No thyromegaly present.  Cardiovascular: Normal rate, regular rhythm, normal heart sounds and intact distal pulses.  Exam reveals no gallop.   No murmur heard. Pulmonary/Chest: Effort normal and breath sounds normal. No respiratory distress. He has no wheezes. He has no rales.  Abdominal: Soft. He exhibits no mass. There is no tenderness.  Musculoskeletal: Normal range of motion. He exhibits no edema and no tenderness.  Lymphadenopathy:    He has no cervical adenopathy.  Neurological: He is alert and oriented to person, place, and time. He exhibits normal muscle tone.       Normal gait and strength  Skin: Skin is warm. No rash noted.       Apparent cyst on vertex--no inflammation Crusted lesion on top of right ear (helix)  Psychiatric: He has a normal mood and affect. His behavior is normal. Judgment and thought content normal.          Assessment & Plan:

## 2011-05-07 NOTE — Assessment & Plan Note (Signed)
Voids okay 

## 2011-05-30 ENCOUNTER — Other Ambulatory Visit: Payer: Self-pay | Admitting: *Deleted

## 2011-05-30 ENCOUNTER — Other Ambulatory Visit: Payer: Self-pay | Admitting: Cardiology

## 2011-05-30 NOTE — Telephone Encounter (Signed)
Refilled Meds from fax  

## 2011-06-01 ENCOUNTER — Encounter: Payer: Self-pay | Admitting: Cardiology

## 2011-06-01 ENCOUNTER — Ambulatory Visit (INDEPENDENT_AMBULATORY_CARE_PROVIDER_SITE_OTHER): Payer: Medicare Other | Admitting: Cardiology

## 2011-06-01 VITALS — BP 162/84 | HR 78 | Ht 70.0 in | Wt 217.0 lb

## 2011-06-01 DIAGNOSIS — I10 Essential (primary) hypertension: Secondary | ICD-10-CM

## 2011-06-01 DIAGNOSIS — I2510 Atherosclerotic heart disease of native coronary artery without angina pectoris: Secondary | ICD-10-CM

## 2011-06-01 DIAGNOSIS — I951 Orthostatic hypotension: Secondary | ICD-10-CM

## 2011-06-01 DIAGNOSIS — R42 Dizziness and giddiness: Secondary | ICD-10-CM | POA: Insufficient documentation

## 2011-06-01 MED ORDER — RAMIPRIL 2.5 MG PO CAPS: 2.5000 mg | ORAL_CAPSULE | Freq: Every day | ORAL | Status: DC

## 2011-06-01 NOTE — Assessment & Plan Note (Signed)
Patient had nonobstructive coronary disease by cardiac catheterization in 2009. He remains asymptomatic. We will continue with risk factor modification.

## 2011-06-01 NOTE — Assessment & Plan Note (Signed)
Blood pressure control today is acceptable. His readings at home have been normal.

## 2011-06-01 NOTE — Patient Instructions (Signed)
We will reduce the ramipril to 2.5 mg daily.  Continue your other therapy.  I will see you again in 1 year.

## 2011-06-01 NOTE — Progress Notes (Signed)
Adrian Singh Date of Birth: 1933-10-30   History of Present Illness: [name] is seen today for yearly followup. He reports that he has been having dizzy spells again over the past few weeks. This is worse when he squats down and gets back up. It is associated with lightheadedness and some mild vertigo. Previously his diuretic dose was reduced by half. He was switched from Cardura to Flomax. He does report that he's been having some hypoglycemic episodes with blood sugars below 100. He denies any tachycardia or palpitations. He has been restricting his caffeine intake. He denies any chest pain.  Current Outpatient Prescriptions on File Prior to Visit  Medication Sig Dispense Refill  . aspirin 325 MG tablet Take 325 mg by mouth daily.        . Cholecalciferol (VITAMIN D) 2000 UNITS CAPS Take by mouth daily.        Marland Kitchen glimepiride (AMARYL) 4 MG tablet Take 2 mg by mouth daily before breakfast.        . metoprolol (TOPROL-XL) 50 MG 24 hr tablet Take 50 mg by mouth daily.        . Naproxen Sodium (ALEVE) 220 MG CAPS Take by mouth as needed.        . pantoprazole (PROTONIX) 40 MG tablet TAKE 1 TABLET BY MOUTH EVERY DAY  90 tablet  2  . Tamsulosin HCl (FLOMAX) 0.4 MG CAPS Take 0.4 mg by mouth daily.        Marland Kitchen triamterene-hydrochlorothiazide (MAXZIDE-25) 37.5-25 MG per tablet Take 0.5 tablets by mouth daily.  90 tablet  3  . vitamin C (ASCORBIC ACID) 500 MG tablet Take 500 mg by mouth daily.          No Known Allergies  Past Medical History  Diagnosis Date  . Hypertrophy of prostate without urinary obstruction and other lower urinary tract symptoms (LUTS)   . Type II or unspecified type diabetes mellitus without mention of complication, not stated as uncontrolled   . Esophageal reflux   . Unspecified essential hypertension   . Osteoarthrosis, unspecified whether generalized or localized, unspecified site   . Plantar wart   . Tachycardia, unspecified   . Orthostatic lightheadedness   .  Atrial fibrillation     x1  . CAD (coronary artery disease)     nonobstructive    Past Surgical History  Procedure Date  . Esophagogastroduodenoscopy 2001    with dilation  . Back surgery 2001  . Rotator cuff repair 2005    Left    History  Smoking status  . Former Smoker  Smokeless tobacco  . Never Used  Comment: Quit after just a few years    History  Alcohol Use No    Family History  Problem Relation Age of Onset  . Stroke Father   . Peripheral vascular disease Father     amputation  . Heart failure Mother     CHF  . Coronary artery disease Mother   . Heart attack Mother     Multiple  . COPD Brother   . Heart disease Brother   . Cancer Brother     ?  . Lung cancer Sister     Review of Systems: The review of systems is positive for orthostatic lightheadedness.  All other systems were reviewed and are negative.  Physical Exam: BP 162/84  Pulse 78  Ht 5\' 10"  (1.778 m)  Wt 217 lb (98.431 kg)  BMI 31.14 kg/m2 He is a pleasant white  male in no acute distress. Repeat blood pressure was 140/70 and there were no changes on orthostatic testing.The patient is alert and oriented x 3.  The mood and affect are normal.  The skin is warm and dry.  Color is normal.  The HEENT exam reveals that the sclera are nonicteric.  The mucous membranes are moist.  The carotids are 2+ without bruits.  There is no thyromegaly.  There is no JVD.  The lungs are clear.  The chest wall is non tender.  The heart exam reveals a regular rate with a normal S1 and S2.  There are no murmurs, gallops, or rubs.  The PMI is not displaced.   Abdominal exam reveals good bowel sounds.  There is no guarding or rebound.  There is no hepatosplenomegaly or tenderness.  There are no masses.  Exam of the legs reveal no clubbing, cyanosis, or edema.  The legs are without rashes.  The distal pulses are intact.  Cranial nerves II - XII are intact.  Motor and sensory functions are intact.  The gait is  normal.  LABORATORY DATA: ECG today demonstrates normal sinus rhythm with left axis deviation. It is otherwise normal.  Assessment / Plan:

## 2011-06-01 NOTE — Assessment & Plan Note (Signed)
He does not have significant orthostatic blood pressure drop on exam today but I still suspect that his medications are contributing to his symptoms. He doesn't want to stop his diuretic since he does have some edema. I have recommended reducing his Altace to 2.5 mg daily. We will see if this improves his symptoms. There is no indication of any significant arrhythmia.

## 2011-06-05 ENCOUNTER — Encounter: Payer: Self-pay | Admitting: Cardiology

## 2011-06-12 LAB — COMPREHENSIVE METABOLIC PANEL
ALT: 32
AST: 35
Albumin: 4
Alkaline Phosphatase: 84
BUN: 17
CO2: 25
Calcium: 9.2
Chloride: 103
Creatinine, Ser: 1.18
GFR calc Af Amer: >60
GFR calc non Af Amer: >60
Glucose, Bld: 89
Potassium: 3.7
Sodium: 140
Total Bilirubin: 0.4
Total Protein: 7.7

## 2011-06-12 LAB — CBC
HCT: 39.1
HCT: 44
Hemoglobin: 13.5
Hemoglobin: 15
MCHC: 34.1
MCHC: 34.6
MCV: 84.3
MCV: 85.3
Platelets: 122 — ABNORMAL LOW
Platelets: 132 — ABNORMAL LOW
RBC: 4.63
RBC: 5.17
RDW: 13.6
RDW: 14.1
WBC: 8.8
WBC: 9

## 2011-06-12 LAB — POCT I-STAT, CHEM 8
BUN: 21
Calcium, Ion: 1.15
Chloride: 105
Creatinine, Ser: 1.2
Glucose, Bld: 93
HCT: 46
Hemoglobin: 15.6
Potassium: 3.8
Sodium: 143
TCO2: 27

## 2011-06-12 LAB — POCT CARDIAC MARKERS
CKMB, poc: 1.7
CKMB, poc: 1.8
CKMB, poc: 1.9
Myoglobin, poc: 201
Myoglobin, poc: 202
Myoglobin, poc: 295
Operator id: 151321
Operator id: 151321
Operator id: 151321
Troponin i, poc: 0.09 — ABNORMAL HIGH
Troponin i, poc: <0.05
Troponin i, poc: <0.05

## 2011-06-12 LAB — DIFFERENTIAL
Basophils Absolute: 0
Basophils Relative: 1
Eosinophils Absolute: 0
Eosinophils Relative: 0
Lymphocytes Relative: 19
Lymphs Abs: 1.7
Monocytes Absolute: 1.1 — ABNORMAL HIGH
Monocytes Relative: 12
Neutro Abs: 6.1
Neutrophils Relative %: 67

## 2011-06-12 LAB — URINALYSIS, ROUTINE W REFLEX MICROSCOPIC
Bilirubin Urine: NEGATIVE
Glucose, UA: NEGATIVE
Hgb urine dipstick: NEGATIVE
Ketones, ur: NEGATIVE
Nitrite: NEGATIVE
Protein, ur: NEGATIVE
Specific Gravity, Urine: 1.007
Urobilinogen, UA: 0.2
pH: 7.5

## 2011-06-12 LAB — URINE CULTURE

## 2011-06-12 LAB — CARDIAC PANEL(CRET KIN+CKTOT+MB+TROPI)
CK, MB: 2.1
CK, MB: 2.1
CK, MB: 2.4
Relative Index: 0.8
Relative Index: 1
Relative Index: 1.1
Total CK: 207
Total CK: 223
Total CK: 253 — ABNORMAL HIGH
Troponin I: 0.01
Troponin I: 0.01
Troponin I: 0.01

## 2011-06-12 LAB — LIPID PANEL
Cholesterol: 133
HDL: 28 — ABNORMAL LOW
LDL Cholesterol: 87
Total CHOL/HDL Ratio: 4.8
Triglycerides: 91
VLDL: 18

## 2011-06-12 LAB — HEPARIN LEVEL (UNFRACTIONATED): Heparin Unfractionated: 0.81 — ABNORMAL HIGH

## 2011-06-12 LAB — PROTIME-INR
INR: 0.9
Prothrombin Time: 12.7

## 2011-06-12 LAB — APTT: aPTT: 27

## 2011-06-12 LAB — TSH: TSH: 3.605

## 2011-06-18 ENCOUNTER — Other Ambulatory Visit: Payer: Self-pay | Admitting: Internal Medicine

## 2011-08-06 ENCOUNTER — Telehealth: Payer: Self-pay | Admitting: *Deleted

## 2011-08-06 NOTE — Telephone Encounter (Signed)
Spoke with patient and advised results   

## 2011-08-06 NOTE — Telephone Encounter (Signed)
Okay to make change In general, this med dose is not changed on as needed basis though if he gets increased swelling that a higher dose helps, that is okay

## 2011-08-06 NOTE — Telephone Encounter (Signed)
Pt is asking that the instructions on his maxzide be changed from one half tablet daily to 1/2 to one, because some days he does have to take a whole tablet.  Please advise. Uses [facility] [address]

## 2011-08-07 ENCOUNTER — Other Ambulatory Visit: Payer: Self-pay | Admitting: *Deleted

## 2011-08-07 MED ORDER — TRIAMTERENE-HCTZ 37.5-25 MG PO TABS: 1.0000 | ORAL_TABLET | Freq: Every day | ORAL | Status: DC

## 2011-09-05 ENCOUNTER — Other Ambulatory Visit: Payer: Self-pay | Admitting: Internal Medicine

## 2011-09-05 MED ORDER — GLUCOSE BLOOD VI STRP: ORAL_STRIP | Status: AC

## 2011-09-05 NOTE — Telephone Encounter (Signed)
Faxed Rx into pharmacy 

## 2011-09-14 ENCOUNTER — Other Ambulatory Visit: Payer: Self-pay | Admitting: Internal Medicine

## 2011-11-05 ENCOUNTER — Encounter: Payer: Self-pay | Admitting: Internal Medicine

## 2011-11-05 ENCOUNTER — Ambulatory Visit (INDEPENDENT_AMBULATORY_CARE_PROVIDER_SITE_OTHER): Payer: Medicare Other | Admitting: Internal Medicine

## 2011-11-05 VITALS — BP 112/60 | HR 81 | Temp 98.5°F | Ht 70.0 in | Wt 213.0 lb

## 2011-11-05 DIAGNOSIS — E1142 Type 2 diabetes mellitus with diabetic polyneuropathy: Secondary | ICD-10-CM | POA: Insufficient documentation

## 2011-11-05 DIAGNOSIS — E1140 Type 2 diabetes mellitus with diabetic neuropathy, unspecified: Secondary | ICD-10-CM | POA: Insufficient documentation

## 2011-11-05 DIAGNOSIS — IMO0001 Reserved for inherently not codable concepts without codable children: Secondary | ICD-10-CM

## 2011-11-05 DIAGNOSIS — E1165 Type 2 diabetes mellitus with hyperglycemia: Secondary | ICD-10-CM | POA: Insufficient documentation

## 2011-11-05 DIAGNOSIS — I2510 Atherosclerotic heart disease of native coronary artery without angina pectoris: Secondary | ICD-10-CM

## 2011-11-05 DIAGNOSIS — I10 Essential (primary) hypertension: Secondary | ICD-10-CM

## 2011-11-05 DIAGNOSIS — L570 Actinic keratosis: Secondary | ICD-10-CM

## 2011-11-05 LAB — BASIC METABOLIC PANEL
BUN: 31 mg/dL — ABNORMAL HIGH (ref 6–23)
CO2: 26 mEq/L (ref 19–32)
Calcium: 9 mg/dL (ref 8.4–10.5)
Chloride: 105 mEq/L (ref 96–112)
Creatinine, Ser: 1.7 mg/dL — ABNORMAL HIGH (ref 0.4–1.5)
GFR: 42.81 mL/min — ABNORMAL LOW (ref >60.00)
Glucose, Bld: 187 mg/dL — ABNORMAL HIGH (ref 70–99)
Potassium: 4.1 mEq/L (ref 3.5–5.1)
Sodium: 138 mEq/L (ref 135–145)

## 2011-11-05 LAB — HEMOGLOBIN A1C: Hgb A1c MFr Bld: 7.2 % — ABNORMAL HIGH (ref 4.6–6.5)

## 2011-11-05 NOTE — Assessment & Plan Note (Signed)
Recurred Will have him see his dermatologist if it progresses at all

## 2011-11-05 NOTE — Patient Instructions (Signed)
Please set up with your dermatologist if the spot on your right ear worsens

## 2011-11-05 NOTE — Progress Notes (Signed)
Subjective:    Patient ID: Adrian Singh, male    DOB: 1933/11/22, 78 y.o.   MRN: [id number]  HPI "I'm here" Concerned about blood sugars running up some Meter not working right---trouble with insurance company but finally got new one Some have been over 300--at night Mornings 120-180 Has been trying to be careful--grilled chicken, vegetables, etc No hypoglycemic spells May have had stomach trouble with metformin  Gets some left eye blurry vision--comes and goes Orthostatic dizziness has gotten better though--careful about not getting up too quick (and not doing yard work now)  Occ chest pain--- "sudden sharp pain"--very rapid and then gone  Has noted swelling in right posterior neck after turning head and looking up Noted ~1 month Still mildly tender  Current Outpatient Prescriptions on File Prior to Visit  Medication Sig Dispense Refill  . aspirin 325 MG tablet Take 325 mg by mouth daily.        . Cholecalciferol (VITAMIN D) 2000 UNITS CAPS Take by mouth daily.        Marland Kitchen glimepiride (AMARYL) 4 MG tablet TAKE 1 TABLET WITH BREAKFAST OR FIRST MAIN MEAL OF DAY  90 tablet  2  . glucose blood test strip Use as instructed  100 each  12  . metoprolol (TOPROL-XL) 50 MG 24 hr tablet Take 50 mg by mouth daily.        . Naproxen Sodium (ALEVE) 220 MG CAPS Take by mouth as needed.        . pantoprazole (PROTONIX) 40 MG tablet TAKE 1 TABLET BY MOUTH EVERY DAY  90 tablet  2  . ramipril (ALTACE) 2.5 MG capsule Take 1 capsule (2.5 mg total) by mouth daily.  90 capsule  3  . Tamsulosin HCl (FLOMAX) 0.4 MG CAPS Take 0.4 mg by mouth daily.        Marland Kitchen triamterene-hydrochlorothiazide (MAXZIDE-25) 37.5-25 MG per tablet Take 1 each (1 tablet total) by mouth daily.  90 tablet  3  . vitamin C (ASCORBIC ACID) 500 MG tablet Take 500 mg by mouth daily.          No Known Allergies  Past Medical History  Diagnosis Date  . Hypertrophy of prostate without urinary obstruction and other lower urinary tract  symptoms (LUTS)   . Type II or unspecified type diabetes mellitus without mention of complication, not stated as uncontrolled   . Esophageal reflux   . Unspecified essential hypertension   . Osteoarthrosis, unspecified whether generalized or localized, unspecified site   . Plantar wart   . Tachycardia, unspecified   . Orthostatic lightheadedness   . Atrial fibrillation     x1  . CAD (coronary artery disease)     nonobstructive    Past Surgical History  Procedure Date  . Esophagogastroduodenoscopy 2001    with dilation  . Back surgery 2001  . Rotator cuff repair 2005    Left    Family History  Problem Relation Age of Onset  . Stroke Father   . Peripheral vascular disease Father     amputation  . Heart failure Mother     CHF  . Coronary artery disease Mother   . Heart attack Mother     Multiple  . COPD Brother   . Heart disease Brother   . Cancer Brother     ?  . Lung cancer Sister     History   Social History  . Marital Status: Married    Spouse Name: N/A    Number  of Children: 4  . Years of Education: N/A   Occupational History  . 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 Smoking status: Former Games developer  . Smokeless tobacco: Never Used   Comment: Quit after just a few years  . Alcohol Use: No  . Drug Use: No  . Sexually Active: Not on file   Other Topics Concern  . Not on file   Social History Narrative   No living willPlans wife and then children to make health care decisions for him if unableWould request at least attempts at resuscitationDoesn't think he would want tube feeds if cognitively unaware   Review of Systems Sleeps okay Appetite is okay Uses aleve, 1 every morning--mostly to loosen up bad shoulder Actinic on right ear is back Weight fairly stable   Objective:   Physical Exam  Constitutional: He appears well-developed and well-nourished. No distress.  Neck: Normal range of motion. Neck supple.  Cardiovascular:  Normal rate, regular rhythm and normal heart sounds.  Exam reveals no gallop.   No murmur heard. Pulmonary/Chest: Effort normal and breath sounds normal. No respiratory distress. He has no wheezes. He has no rales.  Musculoskeletal: He exhibits no edema and no tenderness.  Lymphadenopathy:    He has no cervical adenopathy.  Skin:       ~56mm cyst on right posterior neck---not inflamed Actinic back on right pinna--early (has element of cutaneous horn as well) Stable callous on mid plantar right foot  Psychiatric: He has a normal mood and affect. His behavior is normal. Judgment and thought content normal.          Assessment & Plan:

## 2011-11-05 NOTE — Assessment & Plan Note (Signed)
Has occ chest pain that doesn't sound ischemic No changes

## 2011-11-05 NOTE — Assessment & Plan Note (Signed)
BP Readings from Last 3 Encounters:  11/05/11 112/60  06/01/11 162/84  05/07/11 134/80   Fine now despite lower ramipril dose No changes Will recheck renal

## 2011-11-05 NOTE — Assessment & Plan Note (Signed)
Control has really worsened Not sure why Watching diet, weight stable Will check A1c---plan to add metformin ER if >8% now

## 2011-11-09 ENCOUNTER — Other Ambulatory Visit: Payer: Self-pay | Admitting: Internal Medicine

## 2011-11-19 ENCOUNTER — Other Ambulatory Visit (INDEPENDENT_AMBULATORY_CARE_PROVIDER_SITE_OTHER): Payer: Medicare Other

## 2011-11-19 DIAGNOSIS — N289 Disorder of kidney and ureter, unspecified: Secondary | ICD-10-CM

## 2011-11-19 LAB — BASIC METABOLIC PANEL
BUN: 22 mg/dL (ref 6–23)
CO2: 29 mEq/L (ref 19–32)
Calcium: 9.4 mg/dL (ref 8.4–10.5)
Chloride: 105 mEq/L (ref 96–112)
Creatinine, Ser: 1.3 mg/dL (ref 0.4–1.5)
GFR: 58.19 mL/min — ABNORMAL LOW (ref >60.00)
Glucose, Bld: 139 mg/dL — ABNORMAL HIGH (ref 70–99)
Potassium: 4.6 mEq/L (ref 3.5–5.1)
Sodium: 141 mEq/L (ref 135–145)

## 2011-12-03 ENCOUNTER — Other Ambulatory Visit: Payer: Self-pay | Admitting: Internal Medicine

## 2012-03-21 ENCOUNTER — Other Ambulatory Visit: Payer: Self-pay | Admitting: *Deleted

## 2012-03-21 DIAGNOSIS — I10 Essential (primary) hypertension: Secondary | ICD-10-CM

## 2012-03-21 MED ORDER — RAMIPRIL 2.5 MG PO CAPS: 2.5000 mg | ORAL_CAPSULE | Freq: Every day | ORAL | Status: DC

## 2012-03-21 NOTE — Telephone Encounter (Signed)
Refilled ramipril. 

## 2012-04-16 ENCOUNTER — Other Ambulatory Visit: Payer: Self-pay | Admitting: Internal Medicine

## 2012-05-23 ENCOUNTER — Encounter: Payer: Self-pay | Admitting: Internal Medicine

## 2012-05-23 ENCOUNTER — Ambulatory Visit (INDEPENDENT_AMBULATORY_CARE_PROVIDER_SITE_OTHER): Payer: Medicare Other | Admitting: Internal Medicine

## 2012-05-23 VITALS — BP 138/80 | HR 66 | Temp 97.8°F | Ht 70.0 in | Wt 219.0 lb

## 2012-05-23 DIAGNOSIS — Z0000 Encounter for general adult medical examination without abnormal findings: Secondary | ICD-10-CM

## 2012-05-23 DIAGNOSIS — I2510 Atherosclerotic heart disease of native coronary artery without angina pectoris: Secondary | ICD-10-CM

## 2012-05-23 DIAGNOSIS — I10 Essential (primary) hypertension: Secondary | ICD-10-CM

## 2012-05-23 DIAGNOSIS — K219 Gastro-esophageal reflux disease without esophagitis: Secondary | ICD-10-CM

## 2012-05-23 DIAGNOSIS — N400 Enlarged prostate without lower urinary tract symptoms: Secondary | ICD-10-CM

## 2012-05-23 DIAGNOSIS — E119 Type 2 diabetes mellitus without complications: Secondary | ICD-10-CM

## 2012-05-23 LAB — TSH: TSH: 4.65 u[IU]/mL (ref 0.35–5.50)

## 2012-05-23 LAB — HEPATIC FUNCTION PANEL
ALT: 31 U/L (ref 0–53)
AST: 28 U/L (ref 0–37)
Albumin: 4.3 g/dL (ref 3.5–5.2)
Alkaline Phosphatase: 90 U/L (ref 39–117)
Bilirubin, Direct: 0.1 mg/dL (ref 0.0–0.3)
Total Bilirubin: 0.9 mg/dL (ref 0.3–1.2)
Total Protein: 7.8 g/dL (ref 6.0–8.3)

## 2012-05-23 LAB — CBC WITH DIFFERENTIAL/PLATELET
Basophils Absolute: 0 10*3/uL (ref 0.0–0.1)
Basophils Relative: 0.6 % (ref 0.0–3.0)
Eosinophils Absolute: 0.1 10*3/uL (ref 0.0–0.7)
Eosinophils Relative: 0.8 % (ref 0.0–5.0)
HCT: 42.4 % (ref 39.0–52.0)
Hemoglobin: 13.9 g/dL (ref 13.0–17.0)
Lymphocytes Relative: 21.5 % (ref 12.0–46.0)
Lymphs Abs: 1.3 10*3/uL (ref 0.7–4.0)
MCHC: 32.8 g/dL (ref 30.0–36.0)
MCV: 85.4 fl (ref 78.0–100.0)
Monocytes Absolute: 0.6 10*3/uL (ref 0.1–1.0)
Monocytes Relative: 10 % (ref 3.0–12.0)
Neutro Abs: 4.1 10*3/uL (ref 1.4–7.7)
Neutrophils Relative %: 67.1 % (ref 43.0–77.0)
Platelets: 110 10*3/uL — ABNORMAL LOW (ref 150.0–400.0)
RBC: 4.96 Mil/uL (ref 4.22–5.81)
RDW: 15.3 % — ABNORMAL HIGH (ref 11.5–14.6)
WBC: 6.1 10*3/uL (ref 4.5–10.5)

## 2012-05-23 LAB — BASIC METABOLIC PANEL
BUN: 23 mg/dL (ref 6–23)
CO2: 23 mEq/L (ref 19–32)
Calcium: 9.2 mg/dL (ref 8.4–10.5)
Chloride: 108 mEq/L (ref 96–112)
Creatinine, Ser: 1.3 mg/dL (ref 0.4–1.5)
GFR: 58.11 mL/min — ABNORMAL LOW (ref >60.00)
Glucose, Bld: 140 mg/dL — ABNORMAL HIGH (ref 70–99)
Potassium: 4.9 mEq/L (ref 3.5–5.1)
Sodium: 142 mEq/L (ref 135–145)

## 2012-05-23 LAB — MICROALBUMIN / CREATININE URINE RATIO
Creatinine,U: 312.2 mg/dL
Microalb Creat Ratio: 5.5 mg/g (ref 0.0–30.0)
Microalb, Ur: 17.2 mg/dL — ABNORMAL HIGH (ref 0.0–1.9)

## 2012-05-23 LAB — HEMOGLOBIN A1C: Hgb A1c MFr Bld: 7.9 % — ABNORMAL HIGH (ref 4.6–6.5)

## 2012-05-23 LAB — LIPID PANEL
Cholesterol: 150 mg/dL (ref 0–200)
HDL: 35.4 mg/dL — ABNORMAL LOW (ref >39.00)
Total CHOL/HDL Ratio: 4
Triglycerides: 234 mg/dL — ABNORMAL HIGH (ref 0.0–149.0)
VLDL: 46.8 mg/dL — ABNORMAL HIGH (ref 0.0–40.0)

## 2012-05-23 NOTE — Assessment & Plan Note (Signed)
Controlled on PPI Continue for gastroprotection also

## 2012-05-23 NOTE — Assessment & Plan Note (Signed)
Seems to be quiet Still follows with Dr [place]

## 2012-05-23 NOTE — Assessment & Plan Note (Signed)
Mild nocturnal symptoms Would consider finasteride if worsens

## 2012-05-23 NOTE — Assessment & Plan Note (Signed)
UTD with colon No PSA zostavax too expensive Discussed exercise

## 2012-05-23 NOTE — Assessment & Plan Note (Signed)
Fastings are a bit high Discussed weight loss May need to consider lantus

## 2012-05-23 NOTE — Assessment & Plan Note (Signed)
BP Readings from Last 3 Encounters:  05/23/12 138/80  11/05/11 112/60  06/01/11 162/84   Good control Due for labs

## 2012-05-23 NOTE — Progress Notes (Signed)
Subjective:    Patient ID: Adrian Singh, male    DOB: 1934-04-07, 78 y.o.   MRN: [id number]  HPI Here for physical Tries to stay active with yard work and does some exercises Exercises do help his chronic back pain Discussed his 5# weight gain No falls Advanced directives reviewed Some vestibular problems at times  Checks sugars twice a week Fasting 149/160 Has cut back on simple sugars Has gotten rare low sugar reactions---?delayed meal. Has appt for eye exam  No heart problems No tachycardia  Current Outpatient Prescriptions on File Prior to Visit  Medication Sig Dispense Refill  . aspirin 325 MG tablet Take 325 mg by mouth daily.        . Cholecalciferol (VITAMIN D) 2000 UNITS CAPS Take by mouth daily.        Marland Kitchen glimepiride (AMARYL) 4 MG tablet TAKE 1 TABLET WITH BREAKFAST OR FIRST MAIN MEAL OF DAY  90 tablet  0  . glucose blood test strip Use as instructed  100 each  12  . metoprolol succinate (TOPROL-XL) 50 MG 24 hr tablet TAKE 1 TABLET EVERY DAY  90 tablet  3  . Naproxen Sodium (ALEVE) 220 MG CAPS Take by mouth as needed.        . pantoprazole (PROTONIX) 40 MG tablet TAKE 1 TABLET BY MOUTH EVERY DAY  90 tablet  2  . ramipril (ALTACE) 2.5 MG capsule Take 1 capsule (2.5 mg total) by mouth daily.  90 capsule  0  . Tamsulosin HCl (FLOMAX) 0.4 MG CAPS Take 0.4 mg by mouth daily.        Marland Kitchen triamterene-hydrochlorothiazide (MAXZIDE-25) 37.5-25 MG per tablet TAKE 1 BY MOUTH ONCE DAILY  90 tablet  0    No Known Allergies  Past Medical History  Diagnosis Date  . Hypertrophy of prostate without urinary obstruction and other lower urinary tract symptoms (LUTS)   . Type II or unspecified type diabetes mellitus without mention of complication, not stated as uncontrolled   . Esophageal reflux   . Unspecified essential hypertension   . Osteoarthrosis, unspecified whether generalized or localized, unspecified site   . Plantar wart   . Tachycardia, unspecified   . Orthostatic  lightheadedness   . Atrial fibrillation     x1  . CAD (coronary artery disease)     nonobstructive    Past Surgical History  Procedure Date  . Esophagogastroduodenoscopy 2001    with dilation  . Back surgery 2001  . Rotator cuff repair 2005    Left    Family History  Problem Relation Age of Onset  . Stroke Father   . Peripheral vascular disease Father     amputation  . Heart failure Mother     CHF  . Coronary artery disease Mother   . Heart attack Mother     Multiple  . COPD Brother   . Heart disease Brother   . Cancer Brother     ?  . Lung cancer Sister     History   Social History  . Marital Status: Married    Spouse Name: N/A    Number of Children: 4  . Years of Education: N/A   Occupational History  . 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 Smoking status: Former Games developer  . Smokeless tobacco: Never Used   Comment: Quit after just a few years  . Alcohol Use: No  . Drug Use: No  . Sexually Active: Not  on file   Other Topics Concern  . Not on file   Social History Narrative   No living willPlans wife and then children to make health care decisions for him if unableWould request at least attempts at resuscitationDoesn't think he would want tube feeds if cognitively unaware   Review of Systems  Constitutional: Positive for unexpected weight change. Negative for fatigue.       Weight up 5# Wears seat belt  HENT: Positive for hearing loss, congestion, rhinorrhea, dental problem and tinnitus.        Mild spring allergies---doesn't need meds Gum problems--now better with baking soda Regular with dentist  Respiratory: Negative for cough, chest tightness and shortness of breath.   Cardiovascular: Positive for leg swelling. Negative for chest pain and palpitations.       Thinks triamterene/HCTZ works better 1/2 bid  Gastrointestinal: Positive for constipation. Negative for nausea, vomiting, abdominal pain and blood in stool.        Heartburn quiet on the PPI Bowels slow at times---tries to eat fruit  Genitourinary: Positive for difficulty urinating. Negative for urgency and frequency.       Nocturia x 2-3 No sig daytime problems No sex-no problem  Musculoskeletal: Positive for back pain and arthralgias. Negative for joint swelling.       Chronic back issues Knee pain is better  Skin: Negative for rash.       Still has cyst on posterior neck  Neurological: Positive for dizziness. Negative for syncope, weakness, light-headedness and numbness.       Orthostatic dizziness if he stands up quick from bending over  Hematological: Negative for adenopathy. Does not bruise/bleed easily.  Psychiatric/Behavioral: Negative for disturbed wake/sleep cycle and dysphoric mood. The patient is not nervous/anxious.        Up and down with nocturia---usually can get back to sleep       Objective:   Physical Exam  Constitutional: He is oriented to person, place, and time. He appears well-developed and well-nourished. No distress.  HENT:  Head: Normocephalic and atraumatic.  Right Ear: External ear normal.  Left Ear: External ear normal.  Mouth/Throat: Oropharynx is clear and moist. No oropharyngeal exudate.  Eyes: Conjunctivae and EOM are normal. Pupils are equal, round, and reactive to light.  Neck: Normal range of motion. Neck supple. No thyromegaly present.  Cardiovascular: Normal rate, regular rhythm, normal heart sounds and intact distal pulses.  Exam reveals no gallop.   No murmur heard. Pulmonary/Chest: Effort normal and breath sounds normal. No respiratory distress. He has no wheezes. He has no rales.  Abdominal: Soft. There is no tenderness.  Musculoskeletal: He exhibits no edema and no tenderness.  Lymphadenopathy:    He has no cervical adenopathy.  Neurological: He is alert and oriented to person, place, and time.       Mild decreased sensation to fine touch --mostly left plantar foot  Skin: No rash noted. No  erythema.       Mycotic toenails  Psychiatric: He has a normal mood and affect. His behavior is normal.          Assessment & Plan:

## 2012-05-26 LAB — LDL CHOLESTEROL, DIRECT: Direct LDL: 84.6 mg/dL

## 2012-05-27 ENCOUNTER — Encounter: Payer: Self-pay | Admitting: *Deleted

## 2012-06-12 ENCOUNTER — Other Ambulatory Visit: Payer: Self-pay | Admitting: Internal Medicine

## 2012-06-13 ENCOUNTER — Other Ambulatory Visit: Payer: Self-pay | Admitting: *Deleted

## 2012-06-13 MED ORDER — TAMSULOSIN HCL 0.4 MG PO CAPS: 0.4000 mg | ORAL_CAPSULE | Freq: Every day | ORAL | Status: DC

## 2012-06-18 ENCOUNTER — Ambulatory Visit (INDEPENDENT_AMBULATORY_CARE_PROVIDER_SITE_OTHER): Payer: Medicare Other | Admitting: Cardiology

## 2012-06-18 ENCOUNTER — Encounter: Payer: Self-pay | Admitting: Cardiology

## 2012-06-18 VITALS — BP 114/66 | HR 81 | Ht 70.0 in | Wt 222.0 lb

## 2012-06-18 DIAGNOSIS — I2510 Atherosclerotic heart disease of native coronary artery without angina pectoris: Secondary | ICD-10-CM

## 2012-06-18 DIAGNOSIS — R42 Dizziness and giddiness: Secondary | ICD-10-CM

## 2012-06-18 DIAGNOSIS — I10 Essential (primary) hypertension: Secondary | ICD-10-CM

## 2012-06-18 NOTE — Patient Instructions (Signed)
Continue your current therapy  I will see you again in one year.   

## 2012-06-18 NOTE — Progress Notes (Signed)
Adrian Singh Date of Birth: [birth year]   History of Present Illness: Adrian Singh is seen today for yearly followup. He has a history of nonobstructive coronary disease by cardiac catheterization 2009. He also has a history of orthostatic dizziness. On his visit one year ago we reduced his Altace dose without any significant change in his symptoms. He is on chronic Flomax. He is also on Maxzide. In the past when he tried to stop his diuretic he had more swelling. He still complains of some lightheadedness particularly after bending over or standing up quickly. He has had no syncope. He denies any chest pain or shortness of breath.  Current Outpatient Prescriptions on File Prior to Visit  Medication Sig Dispense Refill  . aspirin 325 MG tablet Take 325 mg by mouth daily.        . Cholecalciferol (VITAMIN D) 2000 UNITS CAPS Take by mouth daily.        Marland Kitchen glimepiride (AMARYL) 4 MG tablet TAKE 1 TABLET WITH BREAKFAST OR FIRST MAIN MEAL OF DAY  90 tablet  0  . glucose blood test strip Use as instructed  100 each  12  . metoprolol succinate (TOPROL-XL) 50 MG 24 hr tablet TAKE 1 TABLET EVERY DAY  90 tablet  3  . Naproxen Sodium (ALEVE) 220 MG CAPS Take by mouth as needed.        . pantoprazole (PROTONIX) 40 MG tablet TAKE 1 TABLET BY MOUTH EVERY DAY  90 tablet  2  . ramipril (ALTACE) 2.5 MG capsule Take 1 capsule (2.5 mg total) by mouth daily.  90 capsule  0  . Tamsulosin HCl (FLOMAX) 0.4 MG CAPS Take 1 capsule (0.4 mg total) by mouth daily.  90 capsule  3  . triamterene-hydrochlorothiazide (MAXZIDE-25) 37.5-25 MG per tablet TAKE 1 BY MOUTH ONCE DAILY  90 tablet  0    No Known Allergies  Past Medical History  Diagnosis Date  . Hypertrophy of prostate without urinary obstruction and other lower urinary tract symptoms (LUTS)   . Type II or unspecified type diabetes mellitus without mention of complication, not stated as uncontrolled   . Esophageal reflux   . Unspecified essential hypertension     . Osteoarthrosis, unspecified whether generalized or localized, unspecified site   . Plantar wart   . Tachycardia, unspecified   . Orthostatic lightheadedness   . Atrial fibrillation     x1  . CAD (coronary artery disease)     nonobstructive    Past Surgical History  Procedure Date  . Esophagogastroduodenoscopy 2001    with dilation  . Back surgery 2001  . Rotator cuff repair 2005    Left    History  Smoking status  . Former Smoker  Smokeless tobacco  . Never Used  Comment: Quit after just a few years    History  Alcohol Use No    Family History  Problem Relation Age of Onset  . Stroke Father   . Peripheral vascular disease Father     amputation  . Heart failure Mother     CHF  . Coronary artery disease Mother   . Heart attack Mother     Multiple  . COPD Brother   . Heart disease Brother   . Cancer Brother     ?  . Lung cancer Sister     Review of Systems: The review of systems is positive for orthostatic lightheadedness.  All other systems were reviewed and are negative.  Physical Exam: BP  114/66  Pulse 81  Ht 5\' 10"  (1.778 m)  Wt 222 lb (100.699 kg)  BMI 31.85 kg/m2 He is a pleasant white male in no acute distress. Repeat blood pressure was 140/70 and there were no changes on orthostatic testing.The patient is alert and oriented x 3.  The mood and affect are normal.  The skin is warm and dry.  Color is normal.  The HEENT exam reveals that the sclera are nonicteric.  The mucous membranes are moist.  The carotids are 2+ without bruits.  There is no thyromegaly.  There is no JVD.  The lungs are clear.  The chest wall is non tender.  The heart exam reveals a regular rate with a normal S1 and S2.  There are no murmurs, gallops, or rubs.  The PMI is not displaced.   Abdominal exam reveals good bowel sounds.  There is no guarding or rebound.  There is no hepatosplenomegaly or tenderness.  There are no masses.  Exam of the legs reveal no clubbing, cyanosis, or  edema.  The legs are without rashes.  The distal pulses are intact.  Cranial nerves II - XII are intact.  Motor and sensory functions are intact.  The gait is normal.  LABORATORY DATA: ECG today demonstrates normal sinus rhythm with possible inferior infarct, old. No acute changes.  Assessment / Plan: 1. Orthostatic dizziness. I think that his Flomax is most likely culprit. He still needs this for his obstructive symptoms. His symptoms are fairly mild now so I made no changes in his therapy. I will followup again in one year.  2. Hypertension, controlled.  3. Nonobstructive coronary disease, asymptomatic.

## 2012-07-19 ENCOUNTER — Other Ambulatory Visit: Payer: Self-pay | Admitting: Internal Medicine

## 2012-08-21 LAB — HM DIABETES EYE EXAM

## 2012-09-02 ENCOUNTER — Encounter: Payer: Self-pay | Admitting: Internal Medicine

## 2012-09-02 ENCOUNTER — Ambulatory Visit (INDEPENDENT_AMBULATORY_CARE_PROVIDER_SITE_OTHER): Payer: Medicare Other | Admitting: Internal Medicine

## 2012-09-02 ENCOUNTER — Ambulatory Visit (INDEPENDENT_AMBULATORY_CARE_PROVIDER_SITE_OTHER)
Admission: RE | Admit: 2012-09-02 | Discharge: 2012-09-02 | Disposition: A | Discharge status: Home / Self Care | Payer: Medicare Other | Source: Ambulatory Visit | Attending: Internal Medicine | Admitting: Internal Medicine

## 2012-09-02 VITALS — BP 150/90 | HR 80 | Temp 97.8°F | Wt 221.0 lb

## 2012-09-02 DIAGNOSIS — R071 Chest pain on breathing: Secondary | ICD-10-CM

## 2012-09-02 DIAGNOSIS — R0789 Other chest pain: Secondary | ICD-10-CM | POA: Insufficient documentation

## 2012-09-02 MED ORDER — TRAMADOL HCL 50 MG PO TABS: 50.0000 mg | ORAL_TABLET | Freq: Three times a day (TID) | ORAL | Status: DC | PRN

## 2012-09-02 NOTE — Patient Instructions (Signed)
Please try the aleve 2 tabs twice a day till the pain is better Try the tramadol if that doesn't help much

## 2012-09-02 NOTE — Progress Notes (Signed)
Subjective:    Patient ID: Adrian Singh, male    DOB: 16-Aug-1934, 78 y.o.   MRN: [id number]  HPI Having pain in right chest and through to back under scapula Notes it if he moves his right arm up Started yesterday on left side )points just above the epigastrium)--then moved to right side Always related to movement--- has just some mild baseline pain then Really "grabs me" when he moves  No known injury No fever or chills No cough No clear cut SOB but has pain with deep breath  Current Outpatient Prescriptions on File Prior to Visit  Medication Sig Dispense Refill  . aspirin 325 MG tablet Take 325 mg by mouth daily.        . Cholecalciferol (VITAMIN D) 2000 UNITS CAPS Take by mouth daily.        Marland Kitchen glimepiride (AMARYL) 4 MG tablet TAKE 1 TABLET WITH BREAKFAST OR FIRST MAIN MEAL OF DAY  90 tablet  3  . glucose blood test strip Use as instructed  100 each  12  . metoprolol succinate (TOPROL-XL) 50 MG 24 hr tablet TAKE 1 TABLET EVERY DAY  90 tablet  3  . Naproxen Sodium (ALEVE) 220 MG CAPS Take by mouth as needed.        . pantoprazole (PROTONIX) 40 MG tablet TAKE 1 TABLET BY MOUTH EVERY DAY  90 tablet  2  . ramipril (ALTACE) 2.5 MG capsule Take 1 capsule (2.5 mg total) by mouth daily.  90 capsule  0  . Tamsulosin HCl (FLOMAX) 0.4 MG CAPS Take 1 capsule (0.4 mg total) by mouth daily.  90 capsule  3  . triamterene-hydrochlorothiazide (MAXZIDE-25) 37.5-25 MG per tablet TAKE 1 BY MOUTH ONCE DAILY  90 tablet  3  . vitamin B-12 (CYANOCOBALAMIN) 1000 MCG tablet Take 1,000 mcg by mouth daily.        No Known Allergies  Past Medical History  Diagnosis Date  . Hypertrophy of prostate without urinary obstruction and other lower urinary tract symptoms (LUTS)   . Type II or unspecified type diabetes mellitus without mention of complication, not stated as uncontrolled   . Esophageal reflux   . Unspecified essential hypertension   . Osteoarthrosis, unspecified whether generalized or localized,  unspecified site   . Plantar wart   . Tachycardia, unspecified   . Orthostatic lightheadedness   . Atrial fibrillation     x1  . CAD (coronary artery disease)     nonobstructive    Past Surgical History  Procedure Date  . Esophagogastroduodenoscopy 2001    with dilation  . Back surgery 2001  . Rotator cuff repair 2005    Left    Family History  Problem Relation Age of Onset  . Stroke Father   . Peripheral vascular disease Father     amputation  . Heart failure Mother     CHF  . Coronary artery disease Mother   . Heart attack Mother     Multiple  . COPD Brother   . Heart disease Brother   . Cancer Brother     ?  . Lung cancer Sister     History   Social History  . Marital Status: Married    Spouse Name: N/A    Number of Children: 4  . Years of Education: N/A   Occupational History  . 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 Smoking status: Former Games developer  . Smokeless tobacco: Never Used  Comment: Quit after just a few years  . Alcohol Use: No  . Drug Use: No  . Sexually Active: Not on file   Other Topics Concern  . Not on file   Social History Narrative   No living willPlans wife and then children to make health care decisions for him if unableWould request at least attempts at resuscitationDoesn't think he would want tube feeds if cognitively unaware   Review of Systems Slight rhinorrhea and sneezed once (caused a lot of pain) No nausea or vomiting Appetite is off today but did okay yesterday    Objective:   Physical Exam  Constitutional: He appears well-developed and well-nourished.       Uncomfortable moving around and lots of trouble getting his shirts off  Neck: Normal range of motion. Neck supple.  Cardiovascular: Normal rate, regular rhythm and normal heart sounds.  Exam reveals no gallop.   No murmur heard. Pulmonary/Chest: Effort normal and breath sounds normal. No respiratory distress. He has no wheezes. He has  no rales. He exhibits tenderness.       Localized tenderness along anterior right T8 or so--in anterior axillary line Tender over posterior axillary line from about T8-11  Abdominal: Soft. There is no tenderness.  Musculoskeletal: He exhibits no edema.  Lymphadenopathy:    He has no cervical adenopathy.  Psychiatric: He has a normal mood and affect. His behavior is normal.          Assessment & Plan:

## 2012-09-02 NOTE — Assessment & Plan Note (Addendum)
Seems to be chest wall but anterior and posterior CXR looks normal Did use leaf blower as back pack last week---not clear that would be related since several days passed before the symptoms Not clearly pleuritic No signs of infection  Will try tramadol in addition to the aleve he is already taking

## 2012-09-03 ENCOUNTER — Other Ambulatory Visit: Payer: Self-pay | Admitting: Cardiology

## 2012-09-03 DIAGNOSIS — I10 Essential (primary) hypertension: Secondary | ICD-10-CM

## 2012-09-03 MED ORDER — RAMIPRIL 2.5 MG PO CAPS: 2.5000 mg | ORAL_CAPSULE | Freq: Every day | ORAL | Status: DC

## 2012-10-23 ENCOUNTER — Other Ambulatory Visit: Payer: Self-pay

## 2012-10-23 MED ORDER — GLUCOSE BLOOD VI STRP: ORAL_STRIP | Status: DC

## 2012-10-23 NOTE — Telephone Encounter (Signed)
Pt request refill contour next test strips to [facility] [place]. Advised pt done.

## 2012-10-28 ENCOUNTER — Encounter: Payer: Self-pay | Admitting: Internal Medicine

## 2012-10-28 ENCOUNTER — Ambulatory Visit (INDEPENDENT_AMBULATORY_CARE_PROVIDER_SITE_OTHER): Payer: Medicare Other | Admitting: Internal Medicine

## 2012-10-28 VITALS — BP 130/70 | HR 79 | Temp 98.1°F | Wt 221.0 lb

## 2012-10-28 DIAGNOSIS — R7309 Other abnormal glucose: Secondary | ICD-10-CM

## 2012-10-28 DIAGNOSIS — M79671 Pain in right foot: Secondary | ICD-10-CM | POA: Insufficient documentation

## 2012-10-28 DIAGNOSIS — R739 Hyperglycemia, unspecified: Secondary | ICD-10-CM | POA: Insufficient documentation

## 2012-10-28 DIAGNOSIS — M79609 Pain in unspecified limb: Secondary | ICD-10-CM

## 2012-10-28 LAB — HEMOGLOBIN A1C: Hgb A1c MFr Bld: 8.5 % — ABNORMAL HIGH (ref 4.6–6.5)

## 2012-10-28 NOTE — Patient Instructions (Signed)
Please go to a running store and try either stability or motion support shoes to see if they help you walk without pain

## 2012-10-28 NOTE — Progress Notes (Signed)
Subjective:    Patient ID: Adrian Singh, male    DOB: [birth year], 79 y.o.   MRN: [id number]  HPI Here with wife Was due for foot surgery (fusion of toes)----was canceled due to blood sugar 377 a week ago He got 282 or so on the same day These were postprandial   Had been taking OTC cold meds and cough drops  Usually 130-140 fasting 160-170 post prandial  Had been on metformin in past "tore my stomach up"  Current Outpatient Prescriptions on File Prior to Visit  Medication Sig Dispense Refill  . aspirin 325 MG tablet Take 325 mg by mouth daily.        . Cholecalciferol (VITAMIN D) 2000 UNITS CAPS Take by mouth daily.        Marland Kitchen glimepiride (AMARYL) 4 MG tablet TAKE 1 TABLET WITH BREAKFAST OR FIRST MAIN MEAL OF DAY  90 tablet  3  . glucose blood (BAYER CONTOUR NEXT TEST) test strip Check blood sugar once daily and as directed. 250.00  100 each  1  . metoprolol succinate (TOPROL-XL) 50 MG 24 hr tablet TAKE 1 TABLET EVERY DAY  90 tablet  3  . Naproxen Sodium (ALEVE) 220 MG CAPS Take by mouth as needed.        . pantoprazole (PROTONIX) 40 MG tablet TAKE 1 TABLET BY MOUTH EVERY DAY  90 tablet  2  . ramipril (ALTACE) 2.5 MG capsule Take 1 capsule (2.5 mg total) by mouth daily.  90 capsule  3  . Tamsulosin HCl (FLOMAX) 0.4 MG CAPS Take 1 capsule (0.4 mg total) by mouth daily.  90 capsule  3  . traMADol (ULTRAM) 50 MG tablet Take 1 tablet (50 mg total) by mouth 3 (three) times daily as needed for pain.  40 tablet  0  . triamterene-hydrochlorothiazide (MAXZIDE-25) 37.5-25 MG per tablet TAKE 1 BY MOUTH ONCE DAILY  90 tablet  3  . vitamin B-12 (CYANOCOBALAMIN) 1000 MCG tablet Take 1,000 mcg by mouth daily.       No current facility-administered medications on file prior to visit.    No Known Allergies  Past Medical History  Diagnosis Date  . Hypertrophy of prostate without urinary obstruction and other lower urinary tract symptoms (LUTS)   . Type II or unspecified type diabetes  mellitus without mention of complication, not stated as uncontrolled   . Esophageal reflux   . Unspecified essential hypertension   . Osteoarthrosis, unspecified whether generalized or localized, unspecified site   . Plantar wart   . Tachycardia, unspecified   . Orthostatic lightheadedness   . Atrial fibrillation     x1  . CAD (coronary artery disease)     nonobstructive    Past Surgical History  Procedure Laterality Date  . Esophagogastroduodenoscopy  2001    with dilation  . Back surgery  2001  . Rotator cuff repair  2005    Left    Family History  Problem Relation Age of Onset  . Stroke Father   . Peripheral vascular disease Father     amputation  . Heart failure Mother     CHF  . Coronary artery disease Mother   . Heart attack Mother     Multiple  . COPD Brother   . Heart disease Brother   . Cancer Brother     ?  . Lung cancer Sister     History   Social History  . Marital Status: Married    Spouse Name: N/A  Number of Children: 4  . Years of Education: N/A   Occupational History  . 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 Smoking status: Former Games developer  . Smokeless tobacco: Never Used     Comment: Quit after just a few years  . Alcohol Use: No  . Drug Use: No  . Sexually Active: Not on file   Other Topics Concern  . Not on file   Social History Narrative   No living will   Plans wife and then children to make health care decisions for him if unable   Would request at least attempts at resuscitation   Doesn't think he would want tube feeds if cognitively unaware   Review of Systems Eating is about the same. Avoids salt and fried food Weight is stable    Objective:   Physical Exam  Constitutional: He appears well-developed and well-nourished. No distress.  Musculoskeletal:  Slight callous under base of 3rd and 4th right metacarpals  Psychiatric: He has a normal mood and affect. His behavior is normal.           Assessment & Plan:

## 2012-10-28 NOTE — Assessment & Plan Note (Signed)
Had transient elevated sugars Might have been related to the cough drops Will recheck the A1c If over 8%, will try metformin XR in low dose

## 2012-10-28 NOTE — Assessment & Plan Note (Signed)
Discussed trying better support shoes before reconsidering surgery

## 2012-10-29 ENCOUNTER — Telehealth: Payer: Self-pay | Admitting: *Deleted

## 2012-10-29 MED ORDER — METFORMIN HCL ER 500 MG PO TB24: 500.0000 mg | ORAL_TABLET | Freq: Every day | ORAL | Status: DC

## 2012-10-29 NOTE — Telephone Encounter (Signed)
Message copied by Sueanne Margarita on Wed Oct 29, 2012 10:38 AM ------      Message from: Tillman Abide I      Created: Tue Oct 28, 2012  1:40 PM       Please call him      The diabetes control has worsened with A1c up to 8.5%      Please try metformin XR 500mg  daily before breakfast      #30 x 11      Will review next month but he should call if he can't tolerate it ------

## 2012-10-29 NOTE — Telephone Encounter (Signed)
Spoke with patient and advised results rx sent to pharmacy by e-script  

## 2012-11-17 ENCOUNTER — Ambulatory Visit: Payer: Medicare Other | Admitting: Internal Medicine

## 2012-11-18 ENCOUNTER — Ambulatory Visit: Payer: Medicare Other | Admitting: Internal Medicine

## 2012-11-18 ENCOUNTER — Telehealth: Payer: Self-pay | Admitting: Family Medicine

## 2012-11-18 NOTE — Telephone Encounter (Signed)
Pt scheduled for 6 mth follow-up tomorrow, please advise

## 2012-11-18 NOTE — Telephone Encounter (Signed)
[facility] needs additional documentation for medicare reimbursement for pt to get diabetic shoes.  They indicated the last [facility] note was not enough.  They need either a comprehensive plan, chart notes, foot exam, or care management.  Fax information to [phone].

## 2012-11-19 ENCOUNTER — Encounter: Payer: Self-pay | Admitting: Internal Medicine

## 2012-11-19 ENCOUNTER — Ambulatory Visit (INDEPENDENT_AMBULATORY_CARE_PROVIDER_SITE_OTHER): Payer: Medicare Other | Admitting: Internal Medicine

## 2012-11-19 VITALS — BP 112/62 | HR 74 | Temp 98.0°F | Wt 218.0 lb

## 2012-11-19 DIAGNOSIS — M79671 Pain in right foot: Secondary | ICD-10-CM

## 2012-11-19 DIAGNOSIS — M79609 Pain in unspecified limb: Secondary | ICD-10-CM

## 2012-11-19 DIAGNOSIS — IMO0001 Reserved for inherently not codable concepts without codable children: Secondary | ICD-10-CM

## 2012-11-19 MED ORDER — GLUCOSE BLOOD VI STRP: ORAL_STRIP | Status: DC

## 2012-11-19 MED ORDER — LANCETS ULTRA THIN MISC: Status: DC

## 2012-11-19 MED ORDER — METFORMIN HCL ER 500 MG PO TB24: 1000.0000 mg | ORAL_TABLET | Freq: Every day | ORAL | Status: DC

## 2012-11-19 NOTE — Telephone Encounter (Signed)
I will have to review this with him at [facility] They are requiring more information for any DME now I will send tomorrow's note if we decide diabetic shoes are appropriate

## 2012-11-19 NOTE — Progress Notes (Signed)
Subjective:    Patient ID: Adrian Singh, male    DOB: 1933/10/21, 79 y.o.   MRN: [id number]  HPI Here with wife Foot is better Now using diabetic socks Pain is better now  Sugars still running 210-260 usually Random but even high with fasting No GI problems with the current med but not lowering his  He actually stopped the glimepiride thinking he was supposed to  Current Outpatient Prescriptions on File Prior to Visit  Medication Sig Dispense Refill  . aspirin 325 MG tablet Take 325 mg by mouth daily.        . Cholecalciferol (VITAMIN D) 2000 UNITS CAPS Take by mouth daily.        Marland Kitchen glimepiride (AMARYL) 4 MG tablet TAKE 1 TABLET WITH BREAKFAST OR FIRST MAIN MEAL OF DAY  90 tablet  3  . glucose blood (BAYER CONTOUR NEXT TEST) test strip Check blood sugar once daily and as directed. 250.00  100 each  1  . metFORMIN (GLUCOPHAGE-XR) 500 MG 24 hr tablet Take 1 tablet (500 mg total) by mouth daily with breakfast.  30 tablet  11  . metoprolol succinate (TOPROL-XL) 50 MG 24 hr tablet TAKE 1 TABLET EVERY DAY  90 tablet  3  . Naproxen Sodium (ALEVE) 220 MG CAPS Take by mouth as needed.        . pantoprazole (PROTONIX) 40 MG tablet TAKE 1 TABLET BY MOUTH EVERY DAY  90 tablet  2  . ramipril (ALTACE) 2.5 MG capsule Take 1 capsule (2.5 mg total) by mouth daily.  90 capsule  3  . Tamsulosin HCl (FLOMAX) 0.4 MG CAPS Take 1 capsule (0.4 mg total) by mouth daily.  90 capsule  3  . traMADol (ULTRAM) 50 MG tablet Take 1 tablet (50 mg total) by mouth 3 (three) times daily as needed for pain.  40 tablet  0  . triamterene-hydrochlorothiazide (MAXZIDE-25) 37.5-25 MG per tablet TAKE 1 BY MOUTH ONCE DAILY  90 tablet  3  . vitamin B-12 (CYANOCOBALAMIN) 1000 MCG tablet Take 1,000 mcg by mouth daily.       No current facility-administered medications on file prior to visit.    No Known Allergies  Past Medical History  Diagnosis Date  . Hypertrophy of prostate without urinary obstruction and other lower  urinary tract symptoms (LUTS)   . Type II or unspecified type diabetes mellitus without mention of complication, not stated as uncontrolled   . Esophageal reflux   . Unspecified essential hypertension   . Osteoarthrosis, unspecified whether generalized or localized, unspecified site   . Plantar wart   . Tachycardia, unspecified   . Orthostatic lightheadedness   . Atrial fibrillation     x1  . CAD (coronary artery disease)     nonobstructive    Past Surgical History  Procedure Laterality Date  . Esophagogastroduodenoscopy  2001    with dilation  . Back surgery  2001  . Rotator cuff repair  2005    Left    Family History  Problem Relation Age of Onset  . Stroke Father   . Peripheral vascular disease Father     amputation  . Heart failure Mother     CHF  . Coronary artery disease Mother   . Heart attack Mother     Multiple  . COPD Brother   . Heart disease Brother   . Cancer Brother     ?  . Lung cancer Sister     History   Social History  .  Marital Status: Married    Spouse Name: N/A    Number of Children: 4  . Years of Education: N/A   Occupational History  . 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 Smoking status: Former Games developer  . Smokeless tobacco: Never Used     Comment: Quit after just a few years  . Alcohol Use: No  . Drug Use: No  . Sexually Active: Not on file   Other Topics Concern  . Not on file   Social History Narrative   No living will   Plans wife and then children to make health care decisions for him if unable   Would request at least attempts at resuscitation   Doesn't think he would want tube feeds if cognitively unaware   Review of Systems Has lost 3# Trying to be more careful--no sugared drinks     Objective:   Physical Exam  Musculoskeletal:  Stable callous on plantar surface of right foot at base of 3rd metatarsal          Assessment & Plan:

## 2012-11-19 NOTE — Patient Instructions (Addendum)
Your can soak your foot in lukewarm water and then use a pumice stone to wear down that callous when needed.  Please increase the metformin to 2 tabs before breakfast Restart the glimepiride every morning Please call within 2-3 weeks if your sugars are not under 140-150 fasting regularly

## 2012-11-19 NOTE — Assessment & Plan Note (Signed)
Callous is reforming but pain is better Forms done for diabetic shoes--hopefully with better support (may need insert to elevated 3rd metatarsal also), surgery would not be needed

## 2012-11-19 NOTE — Telephone Encounter (Signed)
Spoke with [facility] and they will fax over the forms that needs to be signed, which are on your desk.

## 2012-11-19 NOTE — Assessment & Plan Note (Signed)
Not really better yet but he stopped the glimepiride in mistake of communication Will restart this and increase metformin to 1000mg  daily Recheck 3 months ?increase glimepiride Consider lantus if fasting stays up

## 2012-11-19 NOTE — Addendum Note (Signed)
Addended by: Sueanne Margarita on: 11/19/2012 12:13 PM   Modules accepted: Orders

## 2012-12-04 ENCOUNTER — Other Ambulatory Visit: Payer: Self-pay | Admitting: *Deleted

## 2012-12-04 MED ORDER — METOPROLOL SUCCINATE ER 50 MG PO TB24: ORAL_TABLET | ORAL | Status: DC

## 2013-02-18 ENCOUNTER — Ambulatory Visit (INDEPENDENT_AMBULATORY_CARE_PROVIDER_SITE_OTHER): Payer: Medicare Other | Admitting: Internal Medicine

## 2013-02-18 ENCOUNTER — Encounter: Payer: Self-pay | Admitting: Internal Medicine

## 2013-02-18 VITALS — BP 122/80 | HR 65 | Temp 97.9°F | Wt 216.0 lb

## 2013-02-18 DIAGNOSIS — IMO0001 Reserved for inherently not codable concepts without codable children: Secondary | ICD-10-CM

## 2013-02-18 LAB — HEMOGLOBIN A1C: Hgb A1c MFr Bld: 6.7 % — ABNORMAL HIGH (ref 4.6–6.5)

## 2013-02-18 NOTE — Progress Notes (Signed)
Subjective:    Patient ID: Adrian Singh, male    DOB: 1934-02-19, 79 y.o.   MRN: [id number]  HPI Here with wife  Sugars are better 94 yesterday, 147 yesterday None are over 150 Using 1000mg  metformin XR in AM as well as glimeride More active this time of year Tries to avoid sweets and sugared drinks  Wonders about the diabetic shoes No pain recently---had been painful when sugars were up higher Still with callous on right foot---needs to file down and keep smooth Does have diabetic shoes  Current Outpatient Prescriptions on File Prior to Visit  Medication Sig Dispense Refill  . aspirin 325 MG tablet Take 325 mg by mouth daily.        . Cholecalciferol (VITAMIN D) 2000 UNITS CAPS Take by mouth daily.        Marland Kitchen glimepiride (AMARYL) 4 MG tablet TAKE 1 TABLET WITH BREAKFAST OR FIRST MAIN MEAL OF DAY  90 tablet  3  . glucose blood test strip Check blood sugar 1-2 times daily and as directed. 250.00  100 each  3  . LANCETS ULTRA THIN MISC Used to test blood sugar 1-2 times daily dx: 250.00  100 each  0  . metFORMIN (GLUCOPHAGE-XR) 500 MG 24 hr tablet Take 2 tablets (1,000 mg total) by mouth daily with breakfast.  180 tablet  3  . metoprolol succinate (TOPROL-XL) 50 MG 24 hr tablet TAKE 1 TABLET EVERY DAY  90 tablet  1  . Naproxen Sodium (ALEVE) 220 MG CAPS Take by mouth as needed.        . pantoprazole (PROTONIX) 40 MG tablet TAKE 1 TABLET BY MOUTH EVERY DAY  90 tablet  2  . ramipril (ALTACE) 2.5 MG capsule Take 1 capsule (2.5 mg total) by mouth daily.  90 capsule  3  . Tamsulosin HCl (FLOMAX) 0.4 MG CAPS Take 1 capsule (0.4 mg total) by mouth daily.  90 capsule  3  . traMADol (ULTRAM) 50 MG tablet Take 1 tablet (50 mg total) by mouth 3 (three) times daily as needed for pain.  40 tablet  0  . triamterene-hydrochlorothiazide (MAXZIDE-25) 37.5-25 MG per tablet TAKE 1 BY MOUTH ONCE DAILY  90 tablet  3  . vitamin B-12 (CYANOCOBALAMIN) 1000 MCG tablet Take 1,000 mcg by mouth daily.        No current facility-administered medications on file prior to visit.    No Known Allergies  Past Medical History  Diagnosis Date  . Hypertrophy of prostate without urinary obstruction and other lower urinary tract symptoms (LUTS)   . Type II or unspecified type diabetes mellitus without mention of complication, not stated as uncontrolled   . Esophageal reflux   . Unspecified essential hypertension   . Osteoarthrosis, unspecified whether generalized or localized, unspecified site   . Plantar wart   . Tachycardia, unspecified   . Orthostatic lightheadedness   . Atrial fibrillation     x1  . CAD (coronary artery disease)     nonobstructive    Past Surgical History  Procedure Laterality Date  . Esophagogastroduodenoscopy  2001    with dilation  . Back surgery  2001  . Rotator cuff repair  2005    Left    Family History  Problem Relation Age of Onset  . Stroke Father   . Peripheral vascular disease Father     amputation  . Heart failure Mother     CHF  . Coronary artery disease Mother   . Heart attack  Mother     Multiple  . COPD Brother   . Heart disease Brother   . Cancer Brother     ?  . Lung cancer Sister     History   Social History  . Marital Status: Married    Spouse Name: N/A    Number of Children: 4  . Years of Education: N/A   Occupational History  . 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 Smoking status: Former Games developer  . Smokeless tobacco: Never Used     Comment: Quit after just a few years  . Alcohol Use: No  . Drug Use: No  . Sexually Active: Not on file   Other Topics Concern  . Not on file   Social History Narrative   No living will   Plans wife and then children to make health care decisions for him if unable   Would request at least attempts at resuscitation   Doesn't think he would want tube feeds if cognitively unaware   Review of Systems Trying to be careful with eating Weight down 2#    Objective:    Physical Exam  Musculoskeletal: He exhibits no edema.  Skin:  Callous on plantar right foot No ulcers Several mycotic toenails  Psychiatric: He has a normal mood and affect. His behavior is normal.          Assessment & Plan:

## 2013-02-18 NOTE — Assessment & Plan Note (Signed)
Seems to be back under control Now on dual therapy and sugars better Will recheck A1c

## 2013-02-23 ENCOUNTER — Encounter: Payer: Self-pay | Admitting: *Deleted

## 2013-03-06 ENCOUNTER — Other Ambulatory Visit: Payer: Self-pay | Admitting: Internal Medicine

## 2013-03-26 ENCOUNTER — Other Ambulatory Visit: Payer: Self-pay

## 2013-05-07 ENCOUNTER — Other Ambulatory Visit: Payer: Self-pay | Admitting: Internal Medicine

## 2013-05-26 ENCOUNTER — Ambulatory Visit (INDEPENDENT_AMBULATORY_CARE_PROVIDER_SITE_OTHER): Payer: Medicare Other | Admitting: Family Medicine

## 2013-05-26 DIAGNOSIS — Z23 Encounter for immunization: Secondary | ICD-10-CM

## 2013-06-04 ENCOUNTER — Other Ambulatory Visit: Payer: Self-pay | Admitting: Internal Medicine

## 2013-06-19 ENCOUNTER — Other Ambulatory Visit: Payer: Self-pay | Admitting: Internal Medicine

## 2013-07-13 ENCOUNTER — Ambulatory Visit (INDEPENDENT_AMBULATORY_CARE_PROVIDER_SITE_OTHER): Payer: Medicare Other | Admitting: Cardiology

## 2013-07-13 ENCOUNTER — Encounter: Payer: Self-pay | Admitting: Cardiology

## 2013-07-13 VITALS — BP 124/66 | HR 81 | Ht 70.0 in | Wt 211.8 lb

## 2013-07-13 DIAGNOSIS — I2510 Atherosclerotic heart disease of native coronary artery without angina pectoris: Secondary | ICD-10-CM

## 2013-07-13 DIAGNOSIS — R42 Dizziness and giddiness: Secondary | ICD-10-CM

## 2013-07-13 DIAGNOSIS — IMO0001 Reserved for inherently not codable concepts without codable children: Secondary | ICD-10-CM

## 2013-07-13 NOTE — Progress Notes (Signed)
Adrian Singh Date of Birth: Mar 16, 1934   History of Present Illness: Adrian Singh is seen today for yearly followup. He has a history of nonobstructive coronary disease by cardiac catheterization 2009. He also has a history of orthostatic dizziness. On his visit one year ago we reduced his Altace dose without any significant change in his symptoms. He is on chronic Flomax. He is also on Maxzide. In the past when he tried to stop his diuretic he had more swelling. He is unable to come off of Flomax because of his obstructive symptoms. He still experiences some orthostatic lightheadedness. He has no syncope. Denies any chest pain or shortness of breath. He reports his blood sugars were elevated and he was started on metformin with improved results.  Current Outpatient Prescriptions on File Prior to Visit  Medication Sig Dispense Refill  . aspirin 325 MG tablet Take 325 mg by mouth daily.        . Cholecalciferol (VITAMIN D) 2000 UNITS CAPS Take by mouth daily.        Marland Kitchen glimepiride (AMARYL) 4 MG tablet TAKE 1 TABLET WITH BREAKFAST OR FIRST MAIN MEAL OF DAY  90 tablet  0  . glucose blood test strip Check blood sugar 1-2 times daily and as directed. 250.00  100 each  3  . LANCETS ULTRA THIN MISC Used to test blood sugar 1-2 times daily dx: 250.00  100 each  0  . metFORMIN (GLUCOPHAGE-XR) 500 MG 24 hr tablet Take 2 tablets (1,000 mg total) by mouth daily with breakfast.  180 tablet  3  . metoprolol succinate (TOPROL-XL) 50 MG 24 hr tablet TAKE 1 TABLET EVERY DAY  90 tablet  1  . Multiple Vitamin (MULTIVITAMIN) capsule Take 1 capsule by mouth daily.      . Naproxen Sodium (ALEVE) 220 MG CAPS Take by mouth as needed.        . pantoprazole (PROTONIX) 40 MG tablet TAKE 1 TABLET BY MOUTH EVERY DAY  90 tablet  1  . tamsulosin (FLOMAX) 0.4 MG CAPS capsule TAKE ONE CAPSULE BY MOUTH DAILY  90 capsule  1  . triamterene-hydrochlorothiazide (MAXZIDE-25) 37.5-25 MG per tablet TAKE 1 TABLET BY MOUTH EVERY DAY   90 tablet  0  . vitamin B-12 (CYANOCOBALAMIN) 1000 MCG tablet Take 1,000 mcg by mouth daily.       No current facility-administered medications on file prior to visit.    No Known Allergies  Past Medical History  Diagnosis Date  . Hypertrophy of prostate without urinary obstruction and other lower urinary tract symptoms (LUTS)   . Type II or unspecified type diabetes mellitus without mention of complication, not stated as uncontrolled   . Esophageal reflux   . Unspecified essential hypertension   . Osteoarthrosis, unspecified whether generalized or localized, unspecified site   . Plantar wart   . Tachycardia, unspecified   . Orthostatic lightheadedness   . Atrial fibrillation     x1  . CAD (coronary artery disease)     nonobstructive    Past Surgical History  Procedure Laterality Date  . Esophagogastroduodenoscopy  2001    with dilation  . Back surgery  2001  . Rotator cuff repair  2005    Left    History  Smoking status  . Former Smoker  Smokeless tobacco  . Never Used    Comment: Quit after just a few years    History  Alcohol Use No    Family History  Problem Relation Age  of Onset  . Stroke Father   . Peripheral vascular disease Father     amputation  . Heart failure Mother     CHF  . Coronary artery disease Mother   . Heart attack Mother     Multiple  . COPD Brother   . Heart disease Brother   . Cancer Brother     ?  . Lung cancer Sister     Review of Systems: The review of systems is positive for orthostatic lightheadedness.  All other systems were reviewed and are negative.  Physical Exam: BP 124/66  Pulse 81  Ht 5\' 10"  (1.778 m)  Wt 211 lb 13 oz (96.076 kg)  BMI 30.39 kg/m2 He is a pleasant white male in no acute distress.  The HEENT exam is normal.  The carotids are 2+ without bruits.  There is no thyromegaly.  There is no JVD.  The lungs are clear.    The heart exam reveals a regular rate with a normal S1 and S2.  There are no murmurs,  gallops, or rubs.  The PMI is not displaced.   Abdominal exam reveals good bowel sounds.    There is no hepatosplenomegaly or tenderness.  There are no masses.  Exam of the legs reveal no clubbing, cyanosis, or edema.  The legs are without rashes.  The distal pulses are intact.  Cranial nerves II - XII are intact.  Motor and sensory functions are intact.  The gait is normal.  LABORATORY DATA: ECG today demonstrates normal sinus rhythm with possible inferior infarct, old. No acute changes.  Assessment / Plan: 1. Orthostatic dizziness. I think that his Flomax is most likely culprit. He still needs this for his obstructive symptoms. Unable to stop Maxzide because of increased swelling. I recommended stopping ramipril completely. We will continue with his other medications and follow.  2. Hypertension, controlled.  3. Nonobstructive coronary disease, asymptomatic.

## 2013-07-13 NOTE — Patient Instructions (Signed)
Stop taking Altace (ramapril)  Continue your other therapy  I will see you in one year.

## 2013-08-11 ENCOUNTER — Other Ambulatory Visit: Payer: Self-pay | Admitting: Internal Medicine

## 2013-08-26 ENCOUNTER — Ambulatory Visit (INDEPENDENT_AMBULATORY_CARE_PROVIDER_SITE_OTHER): Payer: Medicare Other | Admitting: Internal Medicine

## 2013-08-26 ENCOUNTER — Encounter: Payer: Self-pay | Admitting: Internal Medicine

## 2013-08-26 VITALS — BP 120/60 | HR 90 | Temp 98.3°F | Ht 70.0 in | Wt 215.0 lb

## 2013-08-26 DIAGNOSIS — E1142 Type 2 diabetes mellitus with diabetic polyneuropathy: Secondary | ICD-10-CM

## 2013-08-26 DIAGNOSIS — I2510 Atherosclerotic heart disease of native coronary artery without angina pectoris: Secondary | ICD-10-CM

## 2013-08-26 DIAGNOSIS — N400 Enlarged prostate without lower urinary tract symptoms: Secondary | ICD-10-CM

## 2013-08-26 DIAGNOSIS — R42 Dizziness and giddiness: Secondary | ICD-10-CM

## 2013-08-26 DIAGNOSIS — I10 Essential (primary) hypertension: Secondary | ICD-10-CM

## 2013-08-26 DIAGNOSIS — L570 Actinic keratosis: Secondary | ICD-10-CM

## 2013-08-26 DIAGNOSIS — Z0000 Encounter for general adult medical examination without abnormal findings: Secondary | ICD-10-CM

## 2013-08-26 DIAGNOSIS — E1149 Type 2 diabetes mellitus with other diabetic neurological complication: Secondary | ICD-10-CM

## 2013-08-26 LAB — HEMOGLOBIN A1C: Hgb A1c MFr Bld: 6.6 % — ABNORMAL HIGH (ref 4.6–6.5)

## 2013-08-26 LAB — BASIC METABOLIC PANEL
BUN: 25 mg/dL — ABNORMAL HIGH (ref 6–23)
CO2: 26 mEq/L (ref 19–32)
Calcium: 9.3 mg/dL (ref 8.4–10.5)
Chloride: 106 mEq/L (ref 96–112)
Creatinine, Ser: 1.7 mg/dL — ABNORMAL HIGH (ref 0.4–1.5)
GFR: 42.9 mL/min — ABNORMAL LOW (ref >60.00)
Glucose, Bld: 147 mg/dL — ABNORMAL HIGH (ref 70–99)
Potassium: 4.7 mEq/L (ref 3.5–5.1)
Sodium: 143 mEq/L (ref 135–145)

## 2013-08-26 LAB — HEPATIC FUNCTION PANEL
ALT: 30 U/L (ref 0–53)
AST: 26 U/L (ref 0–37)
Albumin: 4.3 g/dL (ref 3.5–5.2)
Alkaline Phosphatase: 63 U/L (ref 39–117)
Bilirubin, Direct: 0 mg/dL (ref 0.0–0.3)
Total Bilirubin: 0.6 mg/dL (ref 0.3–1.2)
Total Protein: 7.9 g/dL (ref 6.0–8.3)

## 2013-08-26 LAB — LIPID PANEL
Cholesterol: 157 mg/dL (ref 0–200)
HDL: 35.3 mg/dL — ABNORMAL LOW (ref >39.00)
Total CHOL/HDL Ratio: 4
Triglycerides: 236 mg/dL — ABNORMAL HIGH (ref 0.0–149.0)
VLDL: 47.2 mg/dL — ABNORMAL HIGH (ref 0.0–40.0)

## 2013-08-26 LAB — CBC WITH DIFFERENTIAL/PLATELET
Basophils Absolute: 0 10*3/uL (ref 0.0–0.1)
Basophils Relative: 0.4 % (ref 0.0–3.0)
Eosinophils Absolute: 0.1 10*3/uL (ref 0.0–0.7)
Eosinophils Relative: 0.9 % (ref 0.0–5.0)
HCT: 41.3 % (ref 39.0–52.0)
Hemoglobin: 13.6 g/dL (ref 13.0–17.0)
Lymphocytes Relative: 24.5 % (ref 12.0–46.0)
Lymphs Abs: 1.5 10*3/uL (ref 0.7–4.0)
MCHC: 33.1 g/dL (ref 30.0–36.0)
MCV: 86.7 fl (ref 78.0–100.0)
Monocytes Absolute: 0.6 10*3/uL (ref 0.1–1.0)
Monocytes Relative: 9.4 % (ref 3.0–12.0)
Neutro Abs: 4.1 10*3/uL (ref 1.4–7.7)
Neutrophils Relative %: 64.8 % (ref 43.0–77.0)
Platelets: 144 10*3/uL — ABNORMAL LOW (ref 150.0–400.0)
RBC: 4.76 Mil/uL (ref 4.22–5.81)
RDW: 14.1 % (ref 11.5–14.6)
WBC: 6.3 10*3/uL (ref 4.5–10.5)

## 2013-08-26 LAB — TSH: TSH: 3.33 u[IU]/mL (ref 0.35–5.50)

## 2013-08-26 LAB — HM DIABETES FOOT EXAM

## 2013-08-26 LAB — LDL CHOLESTEROL, DIRECT: Direct LDL: 96.8 mg/dL

## 2013-08-26 LAB — T4, FREE: Free T4: 0.8 ng/dL (ref 0.60–1.60)

## 2013-08-26 MED ORDER — RAMIPRIL 2.5 MG PO CAPS: 2.5000 mg | ORAL_CAPSULE | Freq: Every day | ORAL | Status: DC

## 2013-08-26 NOTE — Progress Notes (Signed)
Pre-visit discussion using our clinic review tool. No additional management support is needed unless otherwise documented below in the visit note.  

## 2013-08-26 NOTE — Assessment & Plan Note (Signed)
BP Readings from Last 3 Encounters:  08/26/13 120/60  07/13/13 124/66  02/18/13 122/80   Good control

## 2013-08-26 NOTE — Assessment & Plan Note (Signed)
2 lesions treated with liquid nitrogen 40 seconds x 2 Tolerated well Discussed home care

## 2013-08-26 NOTE — Assessment & Plan Note (Signed)
Okay on the med 

## 2013-08-26 NOTE — Assessment & Plan Note (Signed)
Has been quiet Sees Dr [place]

## 2013-08-26 NOTE — Assessment & Plan Note (Signed)
Fairly healthy UTD on imms and colon No PSA due to age

## 2013-08-26 NOTE — Progress Notes (Signed)
Subjective:    Patient ID: Adrian Singh, male    DOB: 03-02-1934, 79 y.o.   MRN: [id number]  HPI Here for physical Reviewed advanced directives  Went off ramipril but felt his "heart rate got real hard" Now back on it regular Only gets dizzy if he is down a long time and stands up too quick Has cut out caffeine--this has helped keep his heart rate down  No chest pain--but occasionally will have slight tingling No SOB Does "give out" when mowing yard (push mower)---- seems stable No edema  Voids okay  Stress with wife's health Pain from spinal stenosis  Checks sugars regularly (1-2 per week) 120-140 fasting No hypoglycemic reactions unless he misses a meal Needs eye exam Still has callous and bunion on right--not really painful. Uses stone to file down  Current Outpatient Prescriptions on File Prior to Visit  Medication Sig Dispense Refill  . aspirin 325 MG tablet Take 325 mg by mouth daily.        . Cholecalciferol (VITAMIN D) 2000 UNITS CAPS Take by mouth daily.        Marland Kitchen glimepiride (AMARYL) 4 MG tablet TAKE 1 TABLET WITH BREAKFAST OR FIRST MAIN MEAL OF DAY  90 tablet  0  . glucose blood test strip Check blood sugar 1-2 times daily and as directed. 250.00  100 each  3  . LANCETS ULTRA THIN MISC Used to test blood sugar 1-2 times daily dx: 250.00  100 each  0  . metFORMIN (GLUCOPHAGE-XR) 500 MG 24 hr tablet Take 2 tablets (1,000 mg total) by mouth daily with breakfast.  180 tablet  3  . metoprolol succinate (TOPROL-XL) 50 MG 24 hr tablet TAKE 1 TABLET EVERY DAY  90 tablet  1  . Multiple Vitamin (MULTIVITAMIN) capsule Take 1 capsule by mouth daily.      . Naproxen Sodium (ALEVE) 220 MG CAPS Take by mouth as needed.        . pantoprazole (PROTONIX) 40 MG tablet TAKE 1 TABLET BY MOUTH EVERY DAY  90 tablet  1  . tamsulosin (FLOMAX) 0.4 MG CAPS capsule TAKE ONE CAPSULE BY MOUTH DAILY  90 capsule  1  . triamterene-hydrochlorothiazide (MAXZIDE-25) 37.5-25 MG per tablet TAKE 1  TABLET BY MOUTH EVERY DAY  90 tablet  0  . vitamin B-12 (CYANOCOBALAMIN) 1000 MCG tablet Take 1,000 mcg by mouth daily.       No current facility-administered medications on file prior to visit.    No Known Allergies  Past Medical History  Diagnosis Date  . Hypertrophy of prostate without urinary obstruction and other lower urinary tract symptoms (LUTS)   . Type II or unspecified type diabetes mellitus without mention of complication, not stated as uncontrolled   . Esophageal reflux   . Unspecified essential hypertension   . Osteoarthrosis, unspecified whether generalized or localized, unspecified site   . Plantar wart   . Tachycardia, unspecified   . Orthostatic lightheadedness   . Atrial fibrillation     x1  . CAD (coronary artery disease)     nonobstructive    Past Surgical History  Procedure Laterality Date  . Esophagogastroduodenoscopy  2001    with dilation  . Back surgery  2001  . Rotator cuff repair  2005    Left    Family History  Problem Relation Age of Onset  . Stroke Father   . Peripheral vascular disease Father     amputation  . Heart failure Mother  CHF  . Coronary artery disease Mother   . Heart attack Mother     Multiple  . COPD Brother   . Heart disease Brother   . Cancer Brother     ?  . Lung cancer Sister     History   Social History  . Marital Status: Married    Spouse Name: N/A    Number of Children: 4  . Years of Education: N/A   Occupational History  . 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 Smoking status: Former Games developer  . Smokeless tobacco: Never Used     Comment: Quit after just a few years  . Alcohol Use: No  . Drug Use: No  . Sexual Activity: Not on file   Other Topics Concern  . Not on file   Social History Narrative   No living will   Plans wife and then children to make health care decisions for him if unable   Would request at least attempts at resuscitation but no prolonged life  support   Doesn't think he would want tube feeds if cognitively unaware   Review of Systems  Constitutional: Negative for fatigue and unexpected weight change.       Wears seat belt  HENT: Positive for hearing loss, rhinorrhea and tinnitus. Negative for congestion and dental problem.        Uses cold capsule for occasional AM rhinorrhea Full dentures  Eyes: Negative for redness and visual disturbance.  Respiratory: Negative for cough, chest tightness and shortness of breath.   Cardiovascular: Positive for palpitations. Negative for chest pain and leg swelling.  Gastrointestinal: Negative for nausea, vomiting, abdominal pain, constipation and blood in stool.       No heartburn  Endocrine: Positive for cold intolerance. Negative for heat intolerance.  Genitourinary: Positive for frequency. Negative for urgency and difficulty urinating.       No sex--no problem  Musculoskeletal: Positive for arthralgias and back pain. Negative for joint swelling.       Back is better Mild joint pains--nothing really bothersome  Skin: Negative for rash.       Has knot behind right ear and on right temple  Allergic/Immunologic: Negative for environmental allergies and immunocompromised state.  Neurological: Positive for dizziness and headaches. Negative for syncope, weakness, light-headedness and numbness.       Rare headache  Hematological: Negative for adenopathy. Bruises/bleeds easily.  Psychiatric/Behavioral: Negative for sleep disturbance and dysphoric mood. The patient is not nervous/anxious.        Objective:   Physical Exam  Constitutional: He is oriented to person, place, and time. He appears well-developed and well-nourished. No distress.  HENT:  Head: Normocephalic and atraumatic.  Right Ear: External ear normal.  Left Ear: External ear normal.  Mouth/Throat: Oropharynx is clear and moist. No oropharyngeal exudate.  Eyes: Conjunctivae and EOM are normal. Pupils are equal, round, and  reactive to light.  Neck: Normal range of motion. Neck supple. No thyromegaly present.  Cardiovascular: Normal rate, regular rhythm, normal heart sounds and intact distal pulses.  Exam reveals no gallop.   No murmur heard. Pulmonary/Chest: Effort normal and breath sounds normal. No respiratory distress. He has no wheezes. He has no rales.  Abdominal: Soft. There is no tenderness.  Musculoskeletal: He exhibits no edema and no tenderness.  Lymphadenopathy:    He has no cervical adenopathy.  Neurological: He is alert and oriented to person, place, and time.  Mild decreased sensation  on plantar feet  Skin: No rash noted.  2 scaly lesions--- right postauricular and right temporal area  Mycotic toenails Callous on right plantar surface under 4th MT head  Psychiatric: He has a normal mood and affect. His behavior is normal.          Assessment & Plan:

## 2013-08-26 NOTE — Patient Instructions (Signed)
Please set up your eye exam  Diabetes and Exercise Exercising regularly is important. It is not just about losing weight. It has many health benefits, such as:  Improving your overall fitness, flexibility, and endurance.  Increasing your bone density.  Helping with weight control.  Decreasing your body fat.  Increasing your muscle strength.  Reducing stress and tension.  Improving your overall health. People with diabetes who exercise gain additional benefits because exercise:  Reduces appetite.  Improves the body's use of blood sugar (glucose).  Helps lower or control blood glucose.  Decreases blood pressure.  Helps control blood lipids (such as cholesterol and triglycerides).  Improves the body's use of the hormone insulin by:  Increasing the body's insulin sensitivity.  Reducing the body's insulin needs.  Decreases the risk for heart disease because exercising:  Lowers cholesterol and triglycerides levels.  Increases the levels of good cholesterol (such as high-density lipoproteins [HDL]) in the body.  Lowers blood glucose levels. YOUR ACTIVITY PLAN  Choose an activity that you enjoy and set realistic goals. Your health care provider or diabetes educator can help you make an activity plan that works for you. You can break activities into 2 or 3 sessions throughout the day. Doing so is as good as one long session. Exercise ideas include:  Taking the dog for a walk.  Taking the stairs instead of the elevator.  Dancing to your favorite song.  Doing your favorite exercise with a friend. RECOMMENDATIONS FOR EXERCISING WITH TYPE 1 OR TYPE 2 DIABETES   Check your blood glucose before exercising. If blood glucose levels are greater than 240 mg/dL, check for urine ketones. Do not exercise if ketones are present.  Avoid injecting insulin into areas of the body that are going to be exercised. For example, avoid injecting insulin into:  The arms when playing  tennis.  The legs when jogging.  Keep a record of:  Food intake before and after you exercise.  Expected peak times of insulin action.  Blood glucose levels before and after you exercise.  The type and amount of exercise you have done.  Review your records with your health care provider. Your health care provider will help you to develop guidelines for adjusting food intake and insulin amounts before and after exercising.  If you take insulin or oral hypoglycemic agents, watch for signs and symptoms of hypoglycemia. They include:  Dizziness.  Shaking.  Sweating.  Chills.  Confusion.  Drink plenty of water while you exercise to prevent dehydration or heat stroke. Body water is lost during exercise and must be replaced.  Talk to your health care provider before starting an exercise program to make sure it is safe for you. Remember, almost any type of activity is better than none. Document Released: 11/24/2003 Document Revised: 05/06/2013 Document Reviewed: 02/10/2013 [facility] Patient Information 2014 [place], [place].

## 2013-08-26 NOTE — Assessment & Plan Note (Signed)
This is better Back on the ramipril Controlled with care when standing up

## 2013-08-26 NOTE — Assessment & Plan Note (Signed)
Mild sensory loss without pain Continues with diabetic shoes given callous

## 2013-08-26 NOTE — Assessment & Plan Note (Signed)
Hopefully still good control Due for eye exam 

## 2013-08-28 ENCOUNTER — Encounter: Payer: Self-pay | Admitting: *Deleted

## 2013-09-11 ENCOUNTER — Other Ambulatory Visit: Payer: Self-pay | Admitting: Internal Medicine

## 2013-10-12 ENCOUNTER — Telehealth: Payer: Self-pay | Admitting: Family Medicine

## 2013-10-12 NOTE — Telephone Encounter (Signed)
Pt says that his insurance has doubled his co-pay on his Flomax.  He would like to know if a cheaper alternative can be prescribed.

## 2013-10-12 NOTE — Telephone Encounter (Signed)
Make sure it is generic There really is no better alternative that is as safe It is usually not that expensive since it has been generic for a long time now

## 2013-10-13 NOTE — Telephone Encounter (Signed)
Pt came by office and I informed him of what Dr. Silvio Pate said about the medication. I told him to check with other pharmacy prices and he will get back to [place].

## 2013-10-22 ENCOUNTER — Telehealth: Payer: Self-pay | Admitting: *Deleted

## 2013-10-22 NOTE — Telephone Encounter (Signed)
Please see form on your desk asking for prior auth of TAMSULOSIN 0.4 mg.  Please return form back to me

## 2013-10-23 NOTE — Telephone Encounter (Signed)
Adrian Singh with [employer] requested update on status of PA for Tamsulosin. Advised on Dr Alla German desk; Adrian Singh voiced understanding.

## 2013-10-26 NOTE — Telephone Encounter (Signed)
Adrian Singh with [employer] wanting more information about tier exception for Tamsulosin; Adrian Singh needs to know what other medications pt has taken for prostate in addition to or instead of Tamsulosin.

## 2013-10-26 NOTE — Telephone Encounter (Signed)
Form done 

## 2013-10-30 NOTE — Telephone Encounter (Signed)
Stacy with [employer] left v/m; tier exception request for tamsulosin has been denied because pt has not tried lower tier med such as doxazosin; written denial will be sent out but if question contact [place].

## 2013-10-30 NOTE — Telephone Encounter (Signed)
Please call He has a history of orthostatic hypotension so doxazosin is contraindicated While tamsulosin can also cause dizziness it is much less likely to do so I am going to need to talk directly to a medical director there if they won't approve it

## 2013-11-10 ENCOUNTER — Other Ambulatory Visit: Payer: Self-pay | Admitting: Internal Medicine

## 2013-11-17 NOTE — Telephone Encounter (Signed)
Sent request to cover my meds

## 2013-11-25 ENCOUNTER — Other Ambulatory Visit: Payer: Self-pay | Admitting: Internal Medicine

## 2013-12-07 ENCOUNTER — Other Ambulatory Visit: Payer: Self-pay | Admitting: Internal Medicine

## 2014-01-26 ENCOUNTER — Encounter: Payer: Self-pay | Admitting: Internal Medicine

## 2014-01-26 ENCOUNTER — Ambulatory Visit (INDEPENDENT_AMBULATORY_CARE_PROVIDER_SITE_OTHER): Payer: Medicare Other | Admitting: Internal Medicine

## 2014-01-26 ENCOUNTER — Telehealth: Payer: Self-pay | Admitting: Internal Medicine

## 2014-01-26 VITALS — BP 134/76 | HR 76 | Temp 98.5°F | Wt 216.0 lb

## 2014-01-26 DIAGNOSIS — J309 Allergic rhinitis, unspecified: Secondary | ICD-10-CM

## 2014-01-26 MED ORDER — LEVOCETIRIZINE DIHYDROCHLORIDE 5 MG PO TABS: 5.0000 mg | ORAL_TABLET | Freq: Every evening | ORAL | Status: DC

## 2014-01-26 MED ORDER — FLUTICASONE PROPIONATE 50 MCG/ACT NA SUSP: 2.0000 | Freq: Every day | NASAL | Status: DC

## 2014-01-26 NOTE — Progress Notes (Signed)
HPI  Pt presents to the clinic today with c/o watery eyes, runny nose, sneezing, ear fullness and head pressure. He reports this started 1 week ago. He also has some associated cough and chest congestion. The cough is productive of brown mucous. The cough is worse at night. He denies fever, but has had chills or body aches. He has tried OTC sinus and cough medicine without much relief. He does have a history of allergies. He has not had sick contacts.  Past Medical History  Diagnosis Date  . Hypertrophy of prostate without urinary obstruction and other lower urinary tract symptoms (LUTS)   . Type II or unspecified type diabetes mellitus without mention of complication, not stated as uncontrolled   . Esophageal reflux   . Unspecified essential hypertension   . Osteoarthrosis, unspecified whether generalized or localized, unspecified site   . Plantar wart   . Tachycardia, unspecified   . Orthostatic lightheadedness   . Atrial fibrillation     x1  . CAD (coronary artery disease)     nonobstructive    Current Outpatient Prescriptions  Medication Sig Dispense Refill  . aspirin 325 MG tablet Take 325 mg by mouth daily.        . Cholecalciferol (VITAMIN D) 2000 UNITS CAPS Take by mouth daily.        Marland Kitchen glimepiride (AMARYL) 4 MG tablet TAKE 1 TABLET WITH BREAKFAST OR FIRST MAIN MEAL OF DAY  90 tablet  0  . glucose blood test strip Check blood sugar 1-2 times daily and as directed. 250.00  100 each  3  . LANCETS ULTRA THIN MISC Used to test blood sugar 1-2 times daily dx: 250.00  100 each  0  . metFORMIN (GLUCOPHAGE-XR) 500 MG 24 hr tablet TAKE 2 TABLETS (1,000 MG TOTAL) BY MOUTH DAILY WITH BREAKFAST.  180 tablet  3  . metoprolol succinate (TOPROL-XL) 50 MG 24 hr tablet TAKE 1 TABLET EVERY DAY  90 tablet  1  . Multiple Vitamin (MULTIVITAMIN) capsule Take 1 capsule by mouth daily.      . Naproxen Sodium (ALEVE) 220 MG CAPS Take by mouth as needed.        . pantoprazole (PROTONIX) 40 MG tablet  TAKE 1 TABLET BY MOUTH EVERY DAY  90 tablet  1  . ramipril (ALTACE) 2.5 MG capsule Take 1 capsule (2.5 mg total) by mouth daily.  90 capsule  3  . tamsulosin (FLOMAX) 0.4 MG CAPS capsule TAKE ONE CAPSULE BY MOUTH DAILY  90 capsule  1  . triamterene-hydrochlorothiazide (MAXZIDE-25) 37.5-25 MG per tablet TAKE 1 TABLET BY MOUTH EVERY DAY  90 tablet  0  . vitamin B-12 (CYANOCOBALAMIN) 1000 MCG tablet Take 1,000 mcg by mouth daily.       No current facility-administered medications for this visit.    No Known Allergies  Family History  Problem Relation Age of Onset  . Stroke Father   . Peripheral vascular disease Father     amputation  . Heart failure Mother     CHF  . Coronary artery disease Mother   . Heart attack Mother     Multiple  . COPD Brother   . Heart disease Brother   . Cancer Brother     ?  . Lung cancer Sister     History   Social History  . Marital Status: Married    Spouse Name: N/A    Number of Children: 4  . Years of Education: N/A   Occupational History  .  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 Smoking status: Former Research scientist (life sciences)  . Smokeless tobacco: Never Used     Comment: Quit after just a few years  . Alcohol Use: No  . Drug Use: No  . Sexual Activity: Not on file   Other Topics Concern  . Not on file   Social History Narrative   No living will   Plans wife and then children to make health care decisions for him if unable   Would request at least attempts at resuscitation but no prolonged life support   Doesn't think he would want tube feeds if cognitively unaware    ROS:  Constitutional: Pt reports headache, fatigue. Denies fever, malaise, or abrupt weight changes.  HEENT: Pt reports itchy eyes, runny nose. Denies eye pain, eye redness, ear pain, ringing in the ears, wax buildup,  nasal congestion, bloody nose, or sore throat. Respiratory: Pt reports cough. Denies difficulty breathing, shortness of breath, cough or  sputum production.     No other specific complaints in a complete review of systems (except as listed in HPI above).  PE:  BP 134/76  Pulse 76  Temp(Src) 98.5 F (36.9 C) (Oral)  Wt 216 lb (97.977 kg) Wt Readings from Last 3 Encounters:  01/26/14 216 lb (97.977 kg)  08/26/13 215 lb (97.523 kg)  07/13/13 211 lb 13 oz (96.076 kg)    General: Appears his stated age, well developed, well nourished in NAD. HEENT: Head: normal shape and size; Eyes: sclera white, no icterus, conjunctiva pink, PERRLA and EOMs intact; Ears: Tm's gray and intact, normal light reflex; Nose: mucosa pink and moist, septum midline; Throat/Mouth: Teeth present, mucosa erythematous and moist, no lesions or ulcerations noted.  Cardiovascular: Normal rate and rhythm. S1,S2 noted.  No murmur, rubs or gallops noted. No JVD or BLE edema. No carotid bruits noted. Pulmonary/Chest: Normal effort and positive vesicular breath sounds. No respiratory distress. No wheezes, rales or ronchi noted.    BMET    Component Value Date/Time   NA 143 08/26/2013 1022   K 4.7 08/26/2013 1022   CL 106 08/26/2013 1022   CO2 26 08/26/2013 1022   GLUCOSE 147* 08/26/2013 1022   BUN 25* 08/26/2013 1022   CREATININE 1.7* 08/26/2013 1022   CALCIUM 9.3 08/26/2013 1022   GFRNONAA >60 01/05/2008 1915   GFRAA  Value: >60        The eGFR has been calculated using the MDRD equation. This calculation has not been validated in all clinical 01/05/2008 1915    Lipid Panel     Component Value Date/Time   CHOL 157 08/26/2013 1022   TRIG 236.0* 08/26/2013 1022   HDL 35.30* 08/26/2013 1022   CHOLHDL 4 08/26/2013 1022   VLDL 47.2* 08/26/2013 1022   LDLCALC 81 12/23/2009 0000    CBC    Component Value Date/Time   WBC 6.3 08/26/2013 1022   RBC 4.76 08/26/2013 1022   HGB 13.6 08/26/2013 1022   HCT 41.3 08/26/2013 1022   PLT 144.0* 08/26/2013 1022   MCV 86.7 08/26/2013 1022   MCHC 33.1 08/26/2013 1022   RDW 14.1 08/26/2013 1022   LYMPHSABS  1.5 08/26/2013 1022   MONOABS 0.6 08/26/2013 1022   EOSABS 0.1 08/26/2013 1022   BASOSABS 0.0 08/26/2013 1022    Hgb A1C Lab Results  Component Value Date   HGBA1C 6.6* 08/26/2013     Assessment and Plan:  Allergic Rhinitis:  RX for flonase and  xyzal  RTC as needed or if symptoms persist or worsen

## 2014-01-26 NOTE — Progress Notes (Signed)
Pre visit review using our clinic review tool, if applicable. No additional management support is needed unless otherwise documented below in the visit note. 

## 2014-01-26 NOTE — Patient Instructions (Addendum)
Allergic Rhinitis Allergic rhinitis is when the mucous membranes in the nose respond to allergens. Allergens are particles in the air that cause your body to have an allergic reaction. This causes you to release allergic antibodies. Through a chain of events, these eventually cause you to release histamine into the blood stream. Although meant to protect the body, it is this release of histamine that causes your discomfort, such as frequent sneezing, congestion, and an itchy, runny nose.  CAUSES  Seasonal allergic rhinitis (hay fever) is caused by pollen allergens that may come from grasses, trees, and weeds. Year-round allergic rhinitis (perennial allergic rhinitis) is caused by allergens such as house dust mites, pet dander, and mold spores.  SYMPTOMS   Nasal stuffiness (congestion).  Itchy, runny nose with sneezing and tearing of the eyes. DIAGNOSIS  Your health care provider can help you determine the allergen or allergens that trigger your symptoms. If you and your health care provider are unable to determine the allergen, skin or blood testing may be used. TREATMENT  Allergic Rhinitis does not have a cure, but it can be controlled by:  Medicines and allergy shots (immunotherapy).  Avoiding the allergen. Hay fever may often be treated with antihistamines in pill or nasal spray forms. Antihistamines block the effects of histamine. There are over-the-counter medicines that may help with nasal congestion and swelling around the eyes. Check with your health care provider before taking or giving this medicine.  If avoiding the allergen or the medicine prescribed do not work, there are many new medicines your health care provider can prescribe. Stronger medicine may be used if initial measures are ineffective. Desensitizing injections can be used if medicine and avoidance does not work. Desensitization is when a patient is given ongoing shots until the body becomes less sensitive to the allergen.  Make sure you follow up with your health care provider if problems continue. HOME CARE INSTRUCTIONS It is not possible to completely avoid allergens, but you can reduce your symptoms by taking steps to limit your exposure to them. It helps to know exactly what you are allergic to so that you can avoid your specific triggers. SEEK MEDICAL CARE IF:   You have a fever.  You develop a cough that does not stop easily (persistent).  You have shortness of breath.  You start wheezing.  Symptoms interfere with normal daily activities. Document Released: 05/29/2001 Document Revised: 06/24/2013 Document Reviewed: 05/11/2013 Exit[employer] Patient Information 2014 [employer].  

## 2014-01-26 NOTE — Telephone Encounter (Signed)
Patient Information:  Caller Name: Cheyne  Phone: [phone]  Patient: Adrian, Singh  Gender: Male  DOB: 1934-04-06  Age: 80 Years  PCP: Viviana Simpler [facility])  Office Follow Up:  Does the office need to follow up with this patient?: No  Instructions For The Office: N/A   Symptoms  Reason For Call & Symptoms: Patient calling, he started on 5/5 with a cold and congestion, body aches.  His cough is productive, brown in color.  He is afebrile.  Asking for medication to be called in.  His blood sugar is running "in the 130's".  He states that he does have some intermittent wheezing.  Denies a sore throat but his throat itches.  Reviewed Health History In EMR: Yes  Reviewed Medications In EMR: Yes  Reviewed Allergies In EMR: Yes  Reviewed Surgeries / Procedures: Yes  Date of Onset of Symptoms: 01/19/2014  Guideline(s) Used:  Cough  Disposition Per Guideline:   Go to Office Now  Reason For Disposition Reached:   Wheezing is present  Advice Given:  N/A  Patient Will Follow Care Advice:  YES  Appointment Scheduled:  01/26/2014 16:15:00 Appointment Scheduled Provider:  Webb Silversmith

## 2014-02-06 ENCOUNTER — Other Ambulatory Visit: Payer: Self-pay | Admitting: Internal Medicine

## 2014-02-28 ENCOUNTER — Other Ambulatory Visit: Payer: Self-pay | Admitting: Internal Medicine

## 2014-03-09 ENCOUNTER — Ambulatory Visit (INDEPENDENT_AMBULATORY_CARE_PROVIDER_SITE_OTHER): Payer: Medicare Other | Admitting: Internal Medicine

## 2014-03-09 ENCOUNTER — Other Ambulatory Visit: Payer: Self-pay | Admitting: Internal Medicine

## 2014-03-09 ENCOUNTER — Encounter: Payer: Self-pay | Admitting: Internal Medicine

## 2014-03-09 VITALS — BP 120/70 | HR 75 | Temp 97.7°F | Wt 215.0 lb

## 2014-03-09 DIAGNOSIS — I2510 Atherosclerotic heart disease of native coronary artery without angina pectoris: Secondary | ICD-10-CM

## 2014-03-09 DIAGNOSIS — I2584 Coronary atherosclerosis due to calcified coronary lesion: Secondary | ICD-10-CM

## 2014-03-09 DIAGNOSIS — N400 Enlarged prostate without lower urinary tract symptoms: Secondary | ICD-10-CM

## 2014-03-09 DIAGNOSIS — E1149 Type 2 diabetes mellitus with other diabetic neurological complication: Secondary | ICD-10-CM

## 2014-03-09 DIAGNOSIS — N183 Chronic kidney disease, stage 3 unspecified: Secondary | ICD-10-CM

## 2014-03-09 DIAGNOSIS — E1122 Type 2 diabetes mellitus with diabetic chronic kidney disease: Secondary | ICD-10-CM | POA: Insufficient documentation

## 2014-03-09 DIAGNOSIS — I10 Essential (primary) hypertension: Secondary | ICD-10-CM

## 2014-03-09 LAB — RENAL FUNCTION PANEL
Albumin: 4.1 g/dL (ref 3.5–5.2)
BUN: 32 mg/dL — ABNORMAL HIGH (ref 6–23)
CO2: 27 mEq/L (ref 19–32)
Calcium: 9.3 mg/dL (ref 8.4–10.5)
Chloride: 106 mEq/L (ref 96–112)
Creatinine, Ser: 1.6 mg/dL — ABNORMAL HIGH (ref 0.4–1.5)
GFR: 46.03 mL/min — ABNORMAL LOW (ref >60.00)
Glucose, Bld: 200 mg/dL — ABNORMAL HIGH (ref 70–99)
Phosphorus: 2.7 mg/dL (ref 2.3–4.6)
Potassium: 4.8 mEq/L (ref 3.5–5.1)
Sodium: 138 mEq/L (ref 135–145)

## 2014-03-09 LAB — HEMOGLOBIN A1C: Hgb A1c MFr Bld: 6.6 % — ABNORMAL HIGH (ref 4.6–6.5)

## 2014-03-09 NOTE — Assessment & Plan Note (Signed)
Voiding well on Rx

## 2014-03-09 NOTE — Assessment & Plan Note (Signed)
On ACEI Will recheck renal

## 2014-03-09 NOTE — Assessment & Plan Note (Signed)
Seems to still have good control Due for labs No major foot pain

## 2014-03-09 NOTE — Progress Notes (Signed)
Pre visit review using our clinic review tool, if applicable. No additional management support is needed unless otherwise documented below in the visit note. 

## 2014-03-09 NOTE — Progress Notes (Signed)
Subjective:    Patient ID: Adrian Singh, male    DOB: 10-22-1933, 80 y.o.   MRN: [id number]  HPI Doing well No new concerns--except mild right knee pain (and this is improving) Has been doing regular exercise Tries to do regular yard [employer] sugars regularly Seems to be okay-- 120-130 generally No hypoglycemic reaction--or rare (if he misses meals) Foot numbness is better Bunion is better also  No chest pain No SOB Stable exercise tolerance Occasional mild dizziness--better with avoiding caffeine (mild orthostasis in past) No sig edema  Voids okay on the med Stable nocturia x 2  Current Outpatient Prescriptions on File Prior to Visit  Medication Sig Dispense Refill  . aspirin 325 MG tablet Take 325 mg by mouth daily.        . Cholecalciferol (VITAMIN D) 2000 UNITS CAPS Take by mouth daily.        . fluticasone (FLONASE) 50 MCG/ACT nasal spray Place 2 sprays into both nostrils daily.  16 g  6  . glimepiride (AMARYL) 4 MG tablet TAKE 1 TABLET WITH BREAKFAST OR FIRST MAIN MEAL OF DAY  90 tablet  0  . glucose blood test strip Check blood sugar 1-2 times daily and as directed. 250.00  100 each  3  . LANCETS ULTRA THIN MISC Used to test blood sugar 1-2 times daily dx: 250.00  100 each  0  . levocetirizine (XYZAL) 5 MG tablet Take 1 tablet (5 mg total) by mouth every evening.  30 tablet  0  . metFORMIN (GLUCOPHAGE-XR) 500 MG 24 hr tablet TAKE 2 TABLETS (1,000 MG TOTAL) BY MOUTH DAILY WITH BREAKFAST.  180 tablet  3  . metoprolol succinate (TOPROL-XL) 50 MG 24 hr tablet TAKE 1 TABLET EVERY DAY  90 tablet  1  . Multiple Vitamin (MULTIVITAMIN) capsule Take 1 capsule by mouth daily.      . Naproxen Sodium (ALEVE) 220 MG CAPS Take by mouth as needed.        . pantoprazole (PROTONIX) 40 MG tablet TAKE 1 TABLET BY MOUTH EVERY DAY  90 tablet  1  . ramipril (ALTACE) 2.5 MG capsule Take 1 capsule (2.5 mg total) by mouth daily.  90 capsule  3  . tamsulosin (FLOMAX) 0.4 MG  CAPS capsule TAKE ONE CAPSULE BY MOUTH DAILY  90 capsule  1  . triamterene-hydrochlorothiazide (MAXZIDE-25) 37.5-25 MG per tablet TAKE 1 TABLET BY MOUTH EVERY DAY  90 tablet  0  . vitamin B-12 (CYANOCOBALAMIN) 1000 MCG tablet Take 1,000 mcg by mouth daily.       No current facility-administered medications on file prior to visit.    No Known Allergies  Past Medical History  Diagnosis Date  . Hypertrophy of prostate without urinary obstruction and other lower urinary tract symptoms (LUTS)   . Type II or unspecified type diabetes mellitus without mention of complication, not stated as uncontrolled   . Esophageal reflux   . Unspecified essential hypertension   . Osteoarthrosis, unspecified whether generalized or localized, unspecified site   . Plantar wart   . Tachycardia, unspecified   . Orthostatic lightheadedness   . Atrial fibrillation     x1  . CAD (coronary artery disease)     nonobstructive  . Chronic kidney disease, stage III (moderate)     Past Surgical History  Procedure Laterality Date  . Esophagogastroduodenoscopy  2001    with dilation  . Back surgery  2001  . Rotator cuff repair  2005  Left    Family History  Problem Relation Age of Onset  . Stroke Father   . Peripheral vascular disease Father     amputation  . Heart failure Mother     CHF  . Coronary artery disease Mother   . Heart attack Mother     Multiple  . COPD Brother   . Heart disease Brother   . Cancer Brother     ?  . Lung cancer Sister     History   Social History  . Marital Status: Married    Spouse Name: N/A    Number of Children: 4  . Years of Education: N/A   Occupational History  . 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 Smoking status: Former Research scientist (life sciences)  . Smokeless tobacco: Never Used     Comment: Quit after just a few years  . Alcohol Use: No  . Drug Use: No  . Sexual Activity: Not on file   Other Topics Concern  . Not on file   Social History  Narrative   No living will   Plans wife and then children to make health care decisions for him if unable   Would request at least attempts at resuscitation but no prolonged life support   Doesn't think he would want tube feeds if cognitively unaware   Review of [facility] did go away with treatment Still has bump on back of head (cyst) Sleeping well Weight stable    Objective:   Physical Exam  Constitutional: He appears well-developed and well-nourished. No distress.  Neck: Normal range of motion. Neck supple. No thyromegaly present.  Cardiovascular: Normal rate, regular rhythm, normal heart sounds and intact distal pulses.  Exam reveals no gallop.   No murmur heard. Pulmonary/Chest: Effort normal and breath sounds normal. No respiratory distress. He has no wheezes. He has no rales.  Musculoskeletal: He exhibits no edema and no tenderness.  Lymphadenopathy:    He has no cervical adenopathy.  Skin: No rash noted.  Mycotic toenails Callous at base of right 4th metatarsal is better  Psychiatric: He has a normal mood and affect. His behavior is normal.          Assessment & Plan:

## 2014-03-09 NOTE — Assessment & Plan Note (Signed)
No symptoms Has been more active lately and tolerating that well

## 2014-03-09 NOTE — Assessment & Plan Note (Signed)
BP Readings from Last 3 Encounters:  03/09/14 120/70  01/26/14 134/76  08/26/13 120/60   Good control

## 2014-03-10 ENCOUNTER — Encounter: Payer: Self-pay | Admitting: *Deleted

## 2014-03-10 ENCOUNTER — Telehealth: Payer: Self-pay | Admitting: Internal Medicine

## 2014-03-10 NOTE — Telephone Encounter (Signed)
Relevant patient education mailed to patient.  

## 2014-03-17 DIAGNOSIS — I639 Cerebral infarction, unspecified: Secondary | ICD-10-CM

## 2014-03-17 HISTORY — DX: Cerebral infarction, unspecified: I63.9

## 2014-03-18 ENCOUNTER — Encounter: Payer: Self-pay | Admitting: Adult Health

## 2014-03-18 ENCOUNTER — Ambulatory Visit
Admission: RE | Admit: 2014-03-18 | Discharge: 2014-03-18 | Disposition: A | Discharge status: Home / Self Care | Payer: Medicare Other | Source: Ambulatory Visit | Attending: Adult Health | Admitting: Adult Health

## 2014-03-18 ENCOUNTER — Ambulatory Visit (INDEPENDENT_AMBULATORY_CARE_PROVIDER_SITE_OTHER)
Admission: RE | Admit: 2014-03-18 | Discharge: 2014-03-18 | Disposition: A | Discharge status: Home / Self Care | Payer: Medicare Other | Source: Ambulatory Visit | Attending: Adult Health | Admitting: Adult Health

## 2014-03-18 ENCOUNTER — Ambulatory Visit (INDEPENDENT_AMBULATORY_CARE_PROVIDER_SITE_OTHER): Payer: Medicare Other | Admitting: Adult Health

## 2014-03-18 VITALS — BP 138/76 | HR 73 | Temp 98.1°F | Resp 14 | Wt 214.5 lb

## 2014-03-18 DIAGNOSIS — R109 Unspecified abdominal pain: Secondary | ICD-10-CM

## 2014-03-18 LAB — POCT URINALYSIS DIPSTICK
Bilirubin, UA: NEGATIVE
Blood, UA: NEGATIVE
Glucose, UA: NEGATIVE
Ketones, UA: NEGATIVE
Leukocytes, UA: NEGATIVE
Nitrite, UA: NEGATIVE
Protein, UA: NEGATIVE
Spec Grav, UA: >=1.03
Urobilinogen, UA: 1
pH, UA: 5.5

## 2014-03-18 LAB — BASIC METABOLIC PANEL
BUN: 29 mg/dL — ABNORMAL HIGH (ref 6–23)
CO2: 23 mEq/L (ref 19–32)
Calcium: 8.9 mg/dL (ref 8.4–10.5)
Chloride: 108 mEq/L (ref 96–112)
Creatinine, Ser: 1.5 mg/dL (ref 0.4–1.5)
GFR: 47.07 mL/min — ABNORMAL LOW (ref >60.00)
Glucose, Bld: 194 mg/dL — ABNORMAL HIGH (ref 70–99)
Potassium: 4.7 mEq/L (ref 3.5–5.1)
Sodium: 137 mEq/L (ref 135–145)

## 2014-03-18 MED ORDER — TRAMADOL HCL 50 MG PO TABS: 50.0000 mg | ORAL_TABLET | Freq: Three times a day (TID) | ORAL | Status: DC | PRN

## 2014-03-18 MED ORDER — HYDROCODONE-ACETAMINOPHEN 5-325 MG PO TABS: 1.0000 | ORAL_TABLET | Freq: Four times a day (QID) | ORAL | Status: DC | PRN

## 2014-03-18 NOTE — Progress Notes (Signed)
Pre visit review using our clinic review tool, if applicable. No additional management support is needed unless otherwise documented below in the visit note. 

## 2014-03-18 NOTE — Progress Notes (Signed)
Patient ID: Adrian Singh, male   DOB: 04-26-1934, 80 y.o.   MRN: [id number]   Subjective:    Patient ID: Adrian Singh, male    DOB: Dec 18, 1933, 80 y.o.   MRN: [id number]  HPI Pt is a pleasant 80 y/o male with hx of nephrolithiasis, ckd stage III, BPH who presents with left flank pain which radiates across to the lower abdominal area. Hx of nephrolithiasis and reports pain is similar. Reports pain began approximately one week ago. It reports that pain is worsening. He denies dysuria or trouble urinating. He does have a history of hesitancy however this has been ongoing for several years. Takes Flomax every night. Patient denies fever or chills.    Past Medical History  Diagnosis Date  . Hypertrophy of prostate without urinary obstruction and other lower urinary tract symptoms (LUTS)   . Type II or unspecified type diabetes mellitus without mention of complication, not stated as uncontrolled   . Esophageal reflux   . Unspecified essential hypertension   . Osteoarthrosis, unspecified whether generalized or localized, unspecified site   . Plantar wart   . Tachycardia, unspecified   . Orthostatic lightheadedness   . Atrial fibrillation     x1  . CAD (coronary artery disease)     nonobstructive  . Chronic kidney disease, stage III (moderate)     Current Outpatient Prescriptions on File Prior to Visit  Medication Sig Dispense Refill  . aspirin 325 MG tablet Take 325 mg by mouth daily.        . Cholecalciferol (VITAMIN D) 2000 UNITS CAPS Take by mouth daily.        . fluticasone (FLONASE) 50 MCG/ACT nasal spray Place 2 sprays into both nostrils daily.  16 g  6  . glimepiride (AMARYL) 4 MG tablet TAKE 1 TABLET WITH BREAKFAST OR FIRST MAIN MEAL OF DAY  90 tablet  0  . glucose blood test strip Check blood sugar 1-2 times daily and as directed. 250.00  100 each  3  . LANCETS ULTRA THIN MISC Used to test blood sugar 1-2 times daily dx: 250.00  100 each  0  . levocetirizine (XYZAL) 5 MG  tablet Take 1 tablet (5 mg total) by mouth every evening.  30 tablet  0  . metFORMIN (GLUCOPHAGE-XR) 500 MG 24 hr tablet TAKE 2 TABLETS (1,000 MG TOTAL) BY MOUTH DAILY WITH BREAKFAST.  180 tablet  3  . metoprolol succinate (TOPROL-XL) 50 MG 24 hr tablet TAKE 1 TABLET EVERY DAY  90 tablet  1  . Multiple Vitamin (MULTIVITAMIN) capsule Take 1 capsule by mouth daily.      . Naproxen Sodium (ALEVE) 220 MG CAPS Take by mouth as needed.        . pantoprazole (PROTONIX) 40 MG tablet TAKE 1 TABLET BY MOUTH EVERY DAY  90 tablet  3  . ramipril (ALTACE) 2.5 MG capsule Take 1 capsule (2.5 mg total) by mouth daily.  90 capsule  3  . tamsulosin (FLOMAX) 0.4 MG CAPS capsule TAKE ONE CAPSULE BY MOUTH DAILY  90 capsule  1  . triamterene-hydrochlorothiazide (MAXZIDE-25) 37.5-25 MG per tablet TAKE 1 TABLET BY MOUTH EVERY DAY  90 tablet  0  . vitamin B-12 (CYANOCOBALAMIN) 1000 MCG tablet Take 1,000 mcg by mouth daily.       No current facility-administered medications on file prior to visit.     Review of Systems  Constitutional: Negative.  Negative for fever and chills.  HENT: Negative.   Eyes:  Negative.   Respiratory: Negative.   Cardiovascular: Negative.   Gastrointestinal: Negative.   Endocrine: Negative.   Genitourinary: Positive for flank pain. Negative for dysuria, frequency and hematuria.       Some hesitancy but this is not a new finding  Musculoskeletal: Negative.   Skin: Negative.   Allergic/Immunologic: Negative.   Neurological: Negative.   Hematological: Negative.   Psychiatric/Behavioral: Negative.        Objective:  BP 138/76  Pulse 73  Temp(Src) 98.1 F (36.7 C) (Oral)  Resp 14  Wt 214 lb 8 oz (97.297 kg)  SpO2 97%   Physical Exam  Constitutional: He is oriented to person, place, and time. He appears well-developed and well-nourished. No distress.  HENT:  Head: Normocephalic and atraumatic.  Eyes: Conjunctivae and EOM are normal.  Neck: Neck supple.  Cardiovascular: Normal  rate and regular rhythm.   Pulmonary/Chest: Effort normal. No respiratory distress.  Genitourinary:  No costovertebral angle tenderness.  Musculoskeletal: Normal range of motion.  No tenderness in back at the present time.  Neurological: He is alert and oriented to person, place, and time. Coordination normal.  Skin: Skin is warm and dry.  Psychiatric: He has a normal mood and affect. His behavior is normal. Judgment and thought content normal.      Assessment & Plan:   1. Left flank pain UA negative for UTI. Will check bmet and get KUB to evaluate for any stones. Drink fluids to stay hydrated. Instructed to go to the ED if pain becomes severe or if he experiences inability to urinate. Follow up with PCP in 1 week or sooner if necessary.  - POCT Urinalysis Dipstick - Basic metabolic panel - DG Abd 1 View; Future

## 2014-03-18 NOTE — Patient Instructions (Addendum)
  Urinalysis did not show any signs of urinary tract infection.  Please have labs drawn in our office prior to leaving. Then go to the [facility] office to have an x-ray of your abdomen. I am checking for kidney stones.  Continue to drink plenty of water.  Tramadol one tablet every 8 hours as needed for pain. This medication will make you sleepy. Do not drive while taking this medication. Use caution not to fall.  If you develop severe pain or difficulty urinating (passing your water) please go to the Emergency Room.

## 2014-03-23 ENCOUNTER — Ambulatory Visit (INDEPENDENT_AMBULATORY_CARE_PROVIDER_SITE_OTHER): Payer: Medicare Other | Admitting: Internal Medicine

## 2014-03-23 ENCOUNTER — Encounter: Payer: Self-pay | Admitting: Internal Medicine

## 2014-03-23 VITALS — BP 120/70 | HR 75 | Temp 97.8°F | Wt 210.0 lb

## 2014-03-23 DIAGNOSIS — M5430 Sciatica, unspecified side: Secondary | ICD-10-CM

## 2014-03-23 DIAGNOSIS — M5432 Sciatica, left side: Secondary | ICD-10-CM | POA: Insufficient documentation

## 2014-03-23 MED ORDER — PREDNISONE 20 MG PO TABS: 40.0000 mg | ORAL_TABLET | Freq: Every day | ORAL | Status: DC

## 2014-03-23 NOTE — Progress Notes (Signed)
Subjective:    Patient ID: Adrian Singh, male    DOB: 10-03-1933, 80 y.o.   MRN: [id number]  HPI Still having flank pain Has been using the tramadol for this and increased fluids Feels like when he has had stones before-- 2013  No hematuria or dysuria  Left central deep pain in lumbar area--radiates to groin and lateral right leg Notices it when he gets up and stands---especially with sudden movement ("Grabs")  No leg weakness--but is affected by the pain Able to walk around okay  Current Outpatient Prescriptions on File Prior to Visit  Medication Sig Dispense Refill  . aspirin 325 MG tablet Take 325 mg by mouth daily.        . Cholecalciferol (VITAMIN D) 2000 UNITS CAPS Take by mouth daily.        . fluticasone (FLONASE) 50 MCG/ACT nasal spray Place 2 sprays into both nostrils daily.  16 g  6  . glimepiride (AMARYL) 4 MG tablet TAKE 1 TABLET WITH BREAKFAST OR FIRST MAIN MEAL OF DAY  90 tablet  0  . glucose blood test strip Check blood sugar 1-2 times daily and as directed. 250.00  100 each  3  . LANCETS ULTRA THIN MISC Used to test blood sugar 1-2 times daily dx: 250.00  100 each  0  . levocetirizine (XYZAL) 5 MG tablet Take 1 tablet (5 mg total) by mouth every evening.  30 tablet  0  . metFORMIN (GLUCOPHAGE-XR) 500 MG 24 hr tablet TAKE 2 TABLETS (1,000 MG TOTAL) BY MOUTH DAILY WITH BREAKFAST.  180 tablet  3  . metoprolol succinate (TOPROL-XL) 50 MG 24 hr tablet TAKE 1 TABLET EVERY DAY  90 tablet  1  . Multiple Vitamin (MULTIVITAMIN) capsule Take 1 capsule by mouth daily.      . Naproxen Sodium (ALEVE) 220 MG CAPS Take by mouth as needed.        . pantoprazole (PROTONIX) 40 MG tablet TAKE 1 TABLET BY MOUTH EVERY DAY  90 tablet  3  . ramipril (ALTACE) 2.5 MG capsule Take 1 capsule (2.5 mg total) by mouth daily.  90 capsule  3  . tamsulosin (FLOMAX) 0.4 MG CAPS capsule TAKE ONE CAPSULE BY MOUTH DAILY  90 capsule  1  . traMADol (ULTRAM) 50 MG tablet Take 1 tablet (50 mg total) by  mouth every 8 (eight) hours as needed.  30 tablet  0  . triamterene-hydrochlorothiazide (MAXZIDE-25) 37.5-25 MG per tablet TAKE 1 TABLET BY MOUTH EVERY DAY  90 tablet  0  . vitamin B-12 (CYANOCOBALAMIN) 1000 MCG tablet Take 1,000 mcg by mouth daily.       No current facility-administered medications on file prior to visit.    No Known Allergies  Past Medical History  Diagnosis Date  . Hypertrophy of prostate without urinary obstruction and other lower urinary tract symptoms (LUTS)   . Type II or unspecified type diabetes mellitus without mention of complication, not stated as uncontrolled   . Esophageal reflux   . Unspecified essential hypertension   . Osteoarthrosis, unspecified whether generalized or localized, unspecified site   . Plantar wart   . Tachycardia, unspecified   . Orthostatic lightheadedness   . Atrial fibrillation     x1  . CAD (coronary artery disease)     nonobstructive  . Chronic kidney disease, stage III (moderate)     Past Surgical History  Procedure Laterality Date  . Esophagogastroduodenoscopy  2001    with dilation  . Back  surgery  2001  . Rotator cuff repair  2005    Left    Family History  Problem Relation Age of Onset  . Stroke Father   . Peripheral vascular disease Father     amputation  . Heart failure Mother     CHF  . Coronary artery disease Mother   . Heart attack Mother     Multiple  . COPD Brother   . Heart disease Brother   . Cancer Brother     ?  . Lung cancer Sister     History   Social History  . Marital Status: Married    Spouse Name: N/A    Number of Children: 4  . Years of Education: N/A   Occupational History  . 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 Smoking status: Former Research scientist (life sciences)  . Smokeless tobacco: Never Used     Comment: Quit after just a few years  . Alcohol Use: No  . Drug Use: No  . Sexual Activity: Not on file   Other Topics Concern  . Not on file   Social History  Narrative   No living will   Plans wife and then children to make health care decisions for him if unable   Would request at least attempts at resuscitation but no prolonged life support   Doesn't think he would want tube feeds if cognitively unaware   Review of Systems No rash No recent back injury---past surgery in 2000. (for bone spurs)    Objective:   Physical Exam  Constitutional: He appears well-developed and well-nourished. No distress.  Musculoskeletal:  No spine tenderness SLR negative on left but mildly positive on right Mild decreased internal rotation in hips but doesn't cause pain Some tenderness ~L3-4 just lateral to left paraspinals  Neurological:  No focal weakness Gait is slow but normal Able to flex back fully          Assessment & Plan:

## 2014-03-23 NOTE — Patient Instructions (Signed)
If your pain is not better in the next 1-2 weeks, we will need to get you back with a surgeon for reevaluation

## 2014-03-23 NOTE — Progress Notes (Signed)
Pre visit review using our clinic review tool, if applicable. No additional management support is needed unless otherwise documented below in the visit note. 

## 2014-03-23 NOTE — Assessment & Plan Note (Signed)
No clear discogenic symptoms --does have slight contralateral SLR pain though Not kidney stone Will try prednisone May need reeval by back surgeon

## 2014-03-31 ENCOUNTER — Encounter (HOSPITAL_COMMUNITY): Payer: Self-pay | Admitting: Emergency Medicine

## 2014-03-31 ENCOUNTER — Emergency Department (HOSPITAL_COMMUNITY): Payer: Medicare Other

## 2014-03-31 ENCOUNTER — Inpatient Hospital Stay (HOSPITAL_COMMUNITY): Payer: Medicare Other

## 2014-03-31 ENCOUNTER — Inpatient Hospital Stay (HOSPITAL_COMMUNITY)
Admission: EM | Admit: 2014-03-31 | Discharge: 2014-04-02 | DRG: 066 | Disposition: A | Discharge status: Home / Self Care | Payer: Medicare Other | Attending: Internal Medicine | Admitting: Internal Medicine

## 2014-03-31 DIAGNOSIS — I6340 Cerebral infarction due to embolism of unspecified cerebral artery: Secondary | ICD-10-CM | POA: Diagnosis present

## 2014-03-31 DIAGNOSIS — Z7982 Long term (current) use of aspirin: Secondary | ICD-10-CM | POA: Diagnosis not present

## 2014-03-31 DIAGNOSIS — Z823 Family history of stroke: Secondary | ICD-10-CM | POA: Diagnosis not present

## 2014-03-31 DIAGNOSIS — D696 Thrombocytopenia, unspecified: Secondary | ICD-10-CM | POA: Diagnosis present

## 2014-03-31 DIAGNOSIS — N183 Chronic kidney disease, stage 3 unspecified: Secondary | ICD-10-CM | POA: Diagnosis present

## 2014-03-31 DIAGNOSIS — E785 Hyperlipidemia, unspecified: Secondary | ICD-10-CM | POA: Diagnosis present

## 2014-03-31 DIAGNOSIS — N400 Enlarged prostate without lower urinary tract symptoms: Secondary | ICD-10-CM | POA: Diagnosis present

## 2014-03-31 DIAGNOSIS — E1142 Type 2 diabetes mellitus with diabetic polyneuropathy: Secondary | ICD-10-CM

## 2014-03-31 DIAGNOSIS — E1140 Type 2 diabetes mellitus with diabetic neuropathy, unspecified: Secondary | ICD-10-CM | POA: Diagnosis present

## 2014-03-31 DIAGNOSIS — IMO0002 Reserved for concepts with insufficient information to code with codable children: Secondary | ICD-10-CM | POA: Diagnosis not present

## 2014-03-31 DIAGNOSIS — E0842 Diabetes mellitus due to underlying condition with diabetic polyneuropathy: Secondary | ICD-10-CM

## 2014-03-31 DIAGNOSIS — Z8673 Personal history of transient ischemic attack (TIA), and cerebral infarction without residual deficits: Secondary | ICD-10-CM | POA: Diagnosis present

## 2014-03-31 DIAGNOSIS — I10 Essential (primary) hypertension: Secondary | ICD-10-CM | POA: Diagnosis present

## 2014-03-31 DIAGNOSIS — E1165 Type 2 diabetes mellitus with hyperglycemia: Secondary | ICD-10-CM | POA: Diagnosis present

## 2014-03-31 DIAGNOSIS — Z87891 Personal history of nicotine dependence: Secondary | ICD-10-CM | POA: Diagnosis not present

## 2014-03-31 DIAGNOSIS — R42 Dizziness and giddiness: Secondary | ICD-10-CM

## 2014-03-31 DIAGNOSIS — E1149 Type 2 diabetes mellitus with other diabetic neurological complication: Secondary | ICD-10-CM | POA: Diagnosis not present

## 2014-03-31 DIAGNOSIS — I129 Hypertensive chronic kidney disease with stage 1 through stage 4 chronic kidney disease, or unspecified chronic kidney disease: Secondary | ICD-10-CM | POA: Diagnosis present

## 2014-03-31 DIAGNOSIS — I2510 Atherosclerotic heart disease of native coronary artery without angina pectoris: Secondary | ICD-10-CM

## 2014-03-31 DIAGNOSIS — M1990 Unspecified osteoarthritis, unspecified site: Secondary | ICD-10-CM

## 2014-03-31 DIAGNOSIS — I4891 Unspecified atrial fibrillation: Secondary | ICD-10-CM | POA: Diagnosis present

## 2014-03-31 DIAGNOSIS — I639 Cerebral infarction, unspecified: Secondary | ICD-10-CM

## 2014-03-31 DIAGNOSIS — M5432 Sciatica, left side: Secondary | ICD-10-CM

## 2014-03-31 DIAGNOSIS — K219 Gastro-esophageal reflux disease without esophagitis: Secondary | ICD-10-CM | POA: Diagnosis present

## 2014-03-31 DIAGNOSIS — Z0000 Encounter for general adult medical examination without abnormal findings: Secondary | ICD-10-CM

## 2014-03-31 DIAGNOSIS — I6350 Cerebral infarction due to unspecified occlusion or stenosis of unspecified cerebral artery: Secondary | ICD-10-CM

## 2014-03-31 DIAGNOSIS — E1349 Other specified diabetes mellitus with other diabetic neurological complication: Secondary | ICD-10-CM

## 2014-03-31 DIAGNOSIS — L570 Actinic keratosis: Secondary | ICD-10-CM

## 2014-03-31 DIAGNOSIS — E1122 Type 2 diabetes mellitus with diabetic chronic kidney disease: Secondary | ICD-10-CM | POA: Diagnosis present

## 2014-03-31 LAB — URINALYSIS, ROUTINE W REFLEX MICROSCOPIC
Bilirubin Urine: NEGATIVE
Glucose, UA: NEGATIVE mg/dL
Hgb urine dipstick: NEGATIVE
Ketones, ur: NEGATIVE mg/dL
Leukocytes, UA: NEGATIVE
Nitrite: NEGATIVE
Protein, ur: NEGATIVE mg/dL
Specific Gravity, Urine: 1.02 (ref 1.005–1.030)
Urobilinogen, UA: 1 mg/dL (ref 0.0–1.0)
pH: 6 (ref 5.0–8.0)

## 2014-03-31 LAB — COMPREHENSIVE METABOLIC PANEL
ALT: 28 U/L (ref 0–53)
AST: 28 U/L (ref 0–37)
Albumin: 3.8 g/dL (ref 3.5–5.2)
Alkaline Phosphatase: 61 U/L (ref 39–117)
Anion gap: 20 — ABNORMAL HIGH (ref 5–15)
BUN: 49 mg/dL — ABNORMAL HIGH (ref 6–23)
CO2: 16 mEq/L — ABNORMAL LOW (ref 19–32)
Calcium: 9.1 mg/dL (ref 8.4–10.5)
Chloride: 99 mEq/L (ref 96–112)
Creatinine, Ser: 1.78 mg/dL — ABNORMAL HIGH (ref 0.50–1.35)
GFR calc Af Amer: 41 mL/min — ABNORMAL LOW (ref >90)
GFR calc non Af Amer: 35 mL/min — ABNORMAL LOW (ref >90)
Glucose, Bld: 103 mg/dL — ABNORMAL HIGH (ref 70–99)
Potassium: 4.8 mEq/L (ref 3.7–5.3)
Sodium: 135 mEq/L — ABNORMAL LOW (ref 137–147)
Total Bilirubin: 0.5 mg/dL (ref 0.3–1.2)
Total Protein: 7 g/dL (ref 6.0–8.3)

## 2014-03-31 LAB — CBC WITH DIFFERENTIAL/PLATELET
Basophils Absolute: 0 10*3/uL (ref 0.0–0.1)
Basophils Relative: 0 % (ref 0–1)
Eosinophils Absolute: 0.1 10*3/uL (ref 0.0–0.7)
Eosinophils Relative: 1 % (ref 0–5)
HCT: 41.6 % (ref 39.0–52.0)
Hemoglobin: 14.1 g/dL (ref 13.0–17.0)
Lymphocytes Relative: 18 % (ref 12–46)
Lymphs Abs: 2.1 10*3/uL (ref 0.7–4.0)
MCH: 29 pg (ref 26.0–34.0)
MCHC: 33.9 g/dL (ref 30.0–36.0)
MCV: 85.6 fL (ref 78.0–100.0)
Monocytes Absolute: 1 10*3/uL (ref 0.1–1.0)
Monocytes Relative: 9 % (ref 3–12)
Neutro Abs: 8.5 10*3/uL — ABNORMAL HIGH (ref 1.7–7.7)
Neutrophils Relative %: 73 % (ref 43–77)
Platelets: 136 10*3/uL — ABNORMAL LOW (ref 150–400)
RBC: 4.86 MIL/uL (ref 4.22–5.81)
RDW: 14 % (ref 11.5–15.5)
WBC: 11.7 10*3/uL — ABNORMAL HIGH (ref 4.0–10.5)

## 2014-03-31 LAB — PRO B NATRIURETIC PEPTIDE: Pro B Natriuretic peptide (BNP): 179.5 pg/mL (ref 0–450)

## 2014-03-31 LAB — GLUCOSE, CAPILLARY: Glucose-Capillary: 195 mg/dL — ABNORMAL HIGH (ref 70–99)

## 2014-03-31 LAB — LIPASE, BLOOD: Lipase: 280 U/L — ABNORMAL HIGH (ref 11–59)

## 2014-03-31 LAB — TROPONIN I: Troponin I: <0.3 ng/mL (ref <0.30)

## 2014-03-31 MED ORDER — LORAZEPAM 2 MG/ML IJ SOLN
1.0000 mg | Freq: Once | INTRAMUSCULAR | Status: AC
  Administered 2014-03-31: 1 mg via INTRAVENOUS
  Filled 2014-03-31: qty 1

## 2014-03-31 MED ORDER — VITAMIN D3 25 MCG (1000 UNIT) PO TABS
2000.0000 [IU] | ORAL_TABLET | Freq: Every day | ORAL | Status: DC
  Administered 2014-04-01 – 2014-04-02 (×2): 2000 [IU] via ORAL
  Filled 2014-03-31 (×2): qty 2

## 2014-03-31 MED ORDER — SENNOSIDES-DOCUSATE SODIUM 8.6-50 MG PO TABS
1.0000 | ORAL_TABLET | Freq: Every evening | ORAL | Status: DC | PRN
  Filled 2014-03-31: qty 1

## 2014-03-31 MED ORDER — SODIUM CHLORIDE 0.9 % IV SOLN
INTRAVENOUS | Status: DC
  Administered 2014-03-31 – 2014-04-02 (×4): via INTRAVENOUS

## 2014-03-31 MED ORDER — TAMSULOSIN HCL 0.4 MG PO CAPS
0.4000 mg | ORAL_CAPSULE | Freq: Every day | ORAL | Status: DC
  Administered 2014-03-31 – 2014-04-01 (×2): 0.4 mg via ORAL
  Filled 2014-03-31 (×3): qty 1

## 2014-03-31 MED ORDER — MULTIVITAMINS PO CAPS: 1.0000 | ORAL_CAPSULE | Freq: Every day | ORAL | Status: DC

## 2014-03-31 MED ORDER — STROKE: EARLY STAGES OF RECOVERY BOOK
Freq: Once | Status: AC
  Administered 2014-04-01: 10:00:00
  Filled 2014-03-31: qty 1

## 2014-03-31 MED ORDER — ONDANSETRON HCL 4 MG/2ML IJ SOLN: 4.0000 mg | Freq: Four times a day (QID) | INTRAMUSCULAR | Status: DC | PRN

## 2014-03-31 MED ORDER — ASPIRIN 300 MG RE SUPP
300.0000 mg | Freq: Every day | RECTAL | Status: DC
  Filled 2014-03-31 (×2): qty 1

## 2014-03-31 MED ORDER — INSULIN ASPART 100 UNIT/ML ~~LOC~~ SOLN
0.0000 [IU] | SUBCUTANEOUS | Status: DC
  Administered 2014-04-01: 2 [IU] via SUBCUTANEOUS
  Administered 2014-04-01: 1 [IU] via SUBCUTANEOUS
  Administered 2014-04-02: 2 [IU] via SUBCUTANEOUS

## 2014-03-31 MED ORDER — VITAMIN D 50 MCG (2000 UT) PO CAPS: 2000.0000 [IU] | ORAL_CAPSULE | Freq: Every day | ORAL | Status: DC

## 2014-03-31 MED ORDER — OXYCODONE HCL 5 MG PO TABS: 5.0000 mg | ORAL_TABLET | Freq: Four times a day (QID) | ORAL | Status: DC | PRN

## 2014-03-31 MED ORDER — FENTANYL CITRATE 0.05 MG/ML IJ SOLN
50.0000 ug | INTRAMUSCULAR | Status: DC | PRN
  Administered 2014-03-31 (×2): 50 ug via INTRAVENOUS

## 2014-03-31 MED ORDER — ZOLPIDEM TARTRATE 5 MG PO TABS: 5.0000 mg | ORAL_TABLET | Freq: Every evening | ORAL | Status: DC | PRN

## 2014-03-31 MED ORDER — METOPROLOL SUCCINATE ER 50 MG PO TB24
50.0000 mg | ORAL_TABLET | Freq: Every day | ORAL | Status: DC
  Administered 2014-04-01 – 2014-04-02 (×2): 50 mg via ORAL
  Filled 2014-03-31 (×2): qty 1

## 2014-03-31 MED ORDER — PANTOPRAZOLE SODIUM 40 MG PO TBEC
40.0000 mg | DELAYED_RELEASE_TABLET | Freq: Every day | ORAL | Status: DC
  Administered 2014-04-01 – 2014-04-02 (×2): 40 mg via ORAL
  Filled 2014-03-31 (×2): qty 1

## 2014-03-31 MED ORDER — SODIUM CHLORIDE 0.9 % IV BOLUS (SEPSIS)
500.0000 mL | Freq: Once | INTRAVENOUS | Status: AC
  Administered 2014-03-31: 500 mL via INTRAVENOUS

## 2014-03-31 MED ORDER — FENTANYL CITRATE 0.05 MG/ML IJ SOLN
INTRAMUSCULAR | Status: AC
  Administered 2014-03-31: 50 ug via INTRAVENOUS
  Filled 2014-03-31: qty 4

## 2014-03-31 MED ORDER — ASPIRIN EC 81 MG PO TBEC
81.0000 mg | DELAYED_RELEASE_TABLET | Freq: Every day | ORAL | Status: DC
  Administered 2014-03-31: 81 mg via ORAL
  Filled 2014-03-31: qty 1

## 2014-03-31 MED ORDER — ASPIRIN 325 MG PO TABS: 325.0000 mg | ORAL_TABLET | Freq: Every day | ORAL | Status: DC

## 2014-03-31 MED ORDER — LORAZEPAM 2 MG/ML IJ SOLN
1.0000 mg | INTRAMUSCULAR | Status: DC | PRN
  Administered 2014-03-31: 1 mg via INTRAVENOUS
  Filled 2014-03-31: qty 1

## 2014-03-31 MED ORDER — ENOXAPARIN SODIUM 30 MG/0.3ML ~~LOC~~ SOLN
30.0000 mg | SUBCUTANEOUS | Status: DC
  Administered 2014-03-31 – 2014-04-01 (×2): 30 mg via SUBCUTANEOUS
  Filled 2014-03-31 (×3): qty 0.3

## 2014-03-31 MED ORDER — ASPIRIN 81 MG PO CHEW
CHEWABLE_TABLET | ORAL | Status: AC
  Filled 2014-03-31: qty 1

## 2014-03-31 MED ORDER — ACETAMINOPHEN 325 MG PO TABS
650.0000 mg | ORAL_TABLET | ORAL | Status: DC | PRN
  Administered 2014-04-01: 650 mg via ORAL
  Filled 2014-03-31: qty 2

## 2014-03-31 MED ORDER — ADULT MULTIVITAMIN W/MINERALS CH
1.0000 | ORAL_TABLET | Freq: Every day | ORAL | Status: DC
  Administered 2014-04-01 – 2014-04-02 (×2): 1 via ORAL
  Filled 2014-03-31 (×2): qty 1

## 2014-03-31 MED ORDER — ASPIRIN 325 MG PO TABS
325.0000 mg | ORAL_TABLET | Freq: Every day | ORAL | Status: DC
  Administered 2014-04-01 – 2014-04-02 (×2): 325 mg via ORAL
  Filled 2014-03-31 (×2): qty 1

## 2014-03-31 NOTE — Progress Notes (Signed)
pt came over, did brain scan, unable to continue due to pt having severe back pain, was medicated 3x's with ativan and fentanyl but pt still could not tolerate let us know if we need to persist with MRAs that are ordered and if pt can comply for exam Lynelle Doctor , ext [phone]

## 2014-03-31 NOTE — ED Provider Notes (Signed)
CSN: [id number]     Arrival date & time 03/31/14  1022 History   First MD Initiated Contact with Patient 03/31/14 1027     Chief Complaint  Patient presents with  . Dizziness  . Fatigue     (Consider location/radiation/quality/duration/timing/severity/associated sxs/prior Treatment) Patient is a 80 y.o. male presenting with dizziness. The history is provided by the patient and the spouse.  Dizziness Associated symptoms: headaches   Associated symptoms: no chest pain and no shortness of breath    patient was working outside on Saturday when he felt as if there was something wrong with the strength in his left hand and the coordination of his left hand. This has persisted. Yesterday he noted some mild weakness to the left leg. This morning he noted some weakness to the left side of the face. Denies any numbness. Also on Saturday had blacking out of vision or visual changes for about 30 minutes. Vision is now normal. Patient denies severe headache does have a mild headache. Patient denies any neck pain back pain fever chest pain shortness of breath or abdominal pain.  Past Medical History  Diagnosis Date  . Hypertrophy of prostate without urinary obstruction and other lower urinary tract symptoms (LUTS)   . Type II or unspecified type diabetes mellitus without mention of complication, not stated as uncontrolled   . Esophageal reflux   . Unspecified essential hypertension   . Osteoarthrosis, unspecified whether generalized or localized, unspecified site   . Plantar wart   . Tachycardia, unspecified   . Orthostatic lightheadedness   . Atrial fibrillation     x1  . CAD (coronary artery disease)     nonobstructive  . Chronic kidney disease, stage III (moderate)    Past Surgical History  Procedure Laterality Date  . Esophagogastroduodenoscopy  2001    with dilation  . Back surgery  2001  . Rotator cuff repair  2005    Left   Family History  Problem Relation Age of Onset  . Stroke  Father   . Peripheral vascular disease Father     amputation  . Heart failure Mother     CHF  . Coronary artery disease Mother   . Heart attack Mother     Multiple  . COPD Brother   . Heart disease Brother   . Cancer Brother     ?  . Lung cancer Sister    History  Substance Use Topics  . Smoking status: Former Research scientist (life sciences)  . Smokeless tobacco: Never Used     Comment: Quit after just a few years  . Alcohol Use: No    Review of Systems  Constitutional: Negative for fever.  HENT: Negative for congestion and trouble swallowing.   Eyes: Positive for visual disturbance.  Respiratory: Negative for shortness of breath.   Cardiovascular: Negative for chest pain.  Gastrointestinal: Negative for abdominal pain.  Genitourinary: Negative for dysuria.  Musculoskeletal: Positive for gait problem.  Skin: Negative for rash.  Neurological: Positive for facial asymmetry, weakness and headaches. Negative for dizziness, speech difficulty and numbness.  Hematological: Does not bruise/bleed easily.  Psychiatric/Behavioral: Negative for confusion.      Allergies  Review of patient's allergies indicates no known allergies.  Home Medications   Prior to Admission medications   Medication Sig Start Date End Date Taking? Authorizing Provider  aspirin 325 MG tablet Take 325 mg by mouth daily.     Yes Historical Provider, MD  Cholecalciferol (VITAMIN D) 2000 UNITS CAPS Take 2,000 Units  by mouth daily.    Yes Historical Provider, MD  glimepiride (AMARYL) 4 MG tablet Take 2 mg by mouth 2 (two) times daily.   Yes Historical Provider, MD  metFORMIN (GLUMETZA) 500 MG (MOD) 24 hr tablet Take 500 mg by mouth 2 (two) times daily.   Yes Historical Provider, MD  metoprolol succinate (TOPROL-XL) 50 MG 24 hr tablet Take 50 mg by mouth daily. Take with or immediately following a meal.   Yes Historical Provider, MD  Multiple Vitamin (MULTIVITAMIN) capsule Take 1 capsule by mouth daily.   Yes Historical Provider, MD   Naproxen Sodium (ALEVE) 220 MG CAPS Take 220 mg by mouth 2 (two) times daily.    Yes Historical Provider, MD  pantoprazole (PROTONIX) 40 MG tablet Take 40 mg by mouth daily.   Yes Historical Provider, MD  predniSONE (DELTASONE) 20 MG tablet Take 20-40 mg by mouth daily. Starting 03/23/14 40 mg for 5 days, then 20 mg for 5 days   Yes Historical Provider, MD  ramipril (ALTACE) 2.5 MG capsule Take 1 capsule (2.5 mg total) by mouth daily. 08/26/13  Yes [name], MD  tamsulosin (FLOMAX) 0.4 MG CAPS capsule Take 0.4 mg by mouth at bedtime.   Yes Historical Provider, MD  traMADol (ULTRAM) 50 MG tablet Take 1 tablet (50 mg total) by mouth every 8 (eight) hours as needed. 03/18/14  Yes Raquel Rey, NP  triamterene-hydrochlorothiazide (MAXZIDE-25) 37.5-25 MG per tablet Take 1 tablet by mouth daily.   Yes Historical Provider, MD  vitamin B-12 (CYANOCOBALAMIN) 1000 MCG tablet Take 1,000 mcg by mouth daily.   Yes Historical Provider, MD  glucose blood test strip Check blood sugar 1-2 times daily and as directed. 250.00 11/19/12   [name], MD  LANCETS ULTRA THIN MISC Used to test blood sugar 1-2 times daily dx: 250.00 11/19/12   [name], MD   BP 120/71  Pulse 79  Temp(Src) 98.2 F (36.8 C) (Oral)  Resp 19  Ht 5\' 10"  (1.778 m)  Wt 210 lb (95.255 kg)  BMI 30.13 kg/m2  SpO2 96% Physical Exam  Nursing note and vitals reviewed. Constitutional: He is oriented to person, place, and time. He appears well-developed and well-nourished. No distress.  HENT:  Head: Normocephalic and atraumatic.  Mouth/Throat: Oropharynx is clear and moist.  Eyes: Conjunctivae and EOM are normal. Pupils are equal, round, and reactive to light.  Neck: Normal range of motion.  Cardiovascular: Normal rate, regular rhythm and normal heart sounds.   No murmur heard. Pulmonary/Chest: Effort normal and breath sounds normal. No respiratory distress.  Abdominal: Soft. There is tenderness.  Musculoskeletal: He exhibits  no tenderness.  Neurological: He is alert and oriented to person, place, and time. No cranial nerve deficit. He exhibits normal muscle tone. Coordination normal.  Patient with weakness to the left arm and left leg. No obvious facial weakness.    ED Course  Procedures (including critical care time) Labs Review Labs Reviewed  COMPREHENSIVE METABOLIC PANEL - Abnormal; Notable for the following:    Sodium 135 (*)    CO2 16 (*)    Glucose, Bld 103 (*)    BUN 49 (*)    Creatinine, Ser 1.78 (*)    GFR calc non Af Amer 35 (*)    GFR calc Af Amer 41 (*)    Anion gap 20 (*)    All other components within normal limits  LIPASE, BLOOD - Abnormal; Notable for the following:    Lipase 280 (*)  All other components within normal limits  CBC WITH DIFFERENTIAL - Abnormal; Notable for the following:    WBC 11.7 (*)    Platelets 136 (*)    Neutro Abs 8.5 (*)    All other components within normal limits  TROPONIN I  URINALYSIS, ROUTINE W REFLEX MICROSCOPIC  PRO B NATRIURETIC PEPTIDE   Results for orders placed during the hospital encounter of 03/31/14  TROPONIN I      Result Value Ref Range   Troponin I <0.30  <0.30 ng/mL  COMPREHENSIVE METABOLIC PANEL      Result Value Ref Range   Sodium 135 (*) 137 - 147 mEq/L   Potassium 4.8  3.7 - 5.3 mEq/L   Chloride 99  96 - 112 mEq/L   CO2 16 (*) 19 - 32 mEq/L   Glucose, Bld 103 (*) 70 - 99 mg/dL   BUN 49 (*) 6 - 23 mg/dL   Creatinine, Ser 1.78 (*) 0.50 - 1.35 mg/dL   Calcium 9.1  8.4 - 10.5 mg/dL   Total Protein 7.0  6.0 - 8.3 g/dL   Albumin 3.8  3.5 - 5.2 g/dL   AST 28  0 - 37 U/L   ALT 28  0 - 53 U/L   Alkaline Phosphatase 61  39 - 117 U/L   Total Bilirubin 0.5  0.3 - 1.2 mg/dL   GFR calc non Af Amer 35 (*) >90 mL/min   GFR calc Af Amer 41 (*) >90 mL/min   Anion gap 20 (*) 5 - 15  LIPASE, BLOOD      Result Value Ref Range   Lipase 280 (*) 11 - 59 U/L  URINALYSIS, ROUTINE W REFLEX MICROSCOPIC      Result Value Ref Range   Color,  Urine YELLOW  YELLOW   APPearance CLEAR  CLEAR   Specific Gravity, Urine 1.020  1.005 - 1.030   pH 6.0  5.0 - 8.0   Glucose, UA NEGATIVE  NEGATIVE mg/dL   Hgb urine dipstick NEGATIVE  NEGATIVE   Bilirubin Urine NEGATIVE  NEGATIVE   Ketones, ur NEGATIVE  NEGATIVE mg/dL   Protein, ur NEGATIVE  NEGATIVE mg/dL   Urobilinogen, UA 1.0  0.0 - 1.0 mg/dL   Nitrite NEGATIVE  NEGATIVE   Leukocytes, UA NEGATIVE  NEGATIVE  CBC WITH DIFFERENTIAL      Result Value Ref Range   WBC 11.7 (*) 4.0 - 10.5 K/uL   RBC 4.86  4.22 - 5.81 MIL/uL   Hemoglobin 14.1  13.0 - 17.0 g/dL   HCT 41.6  39.0 - 52.0 %   MCV 85.6  78.0 - 100.0 fL   MCH 29.0  26.0 - 34.0 pg   MCHC 33.9  30.0 - 36.0 g/dL   RDW 14.0  11.5 - 15.5 %   Platelets 136 (*) 150 - 400 K/uL   Neutrophils Relative % 73  43 - 77 %   Neutro Abs 8.5 (*) 1.7 - 7.7 K/uL   Lymphocytes Relative 18  12 - 46 %   Lymphs Abs 2.1  0.7 - 4.0 K/uL   Monocytes Relative 9  3 - 12 %   Monocytes Absolute 1.0  0.1 - 1.0 K/uL   Eosinophils Relative 1  0 - 5 %   Eosinophils Absolute 0.1  0.0 - 0.7 K/uL   Basophils Relative 0  0 - 1 %   Basophils Absolute 0.0  0.0 - 0.1 K/uL  PRO B NATRIURETIC PEPTIDE      Result  Value Ref Range   Pro B Natriuretic peptide (BNP) 179.5  0 - 450 pg/mL     Imaging Review Ct Head Wo Contrast  03/31/2014   CLINICAL DATA:  Left hand weakness  EXAM: CT HEAD WITHOUT CONTRAST  TECHNIQUE: Contiguous axial images were obtained from the base of the skull through the vertex without intravenous contrast.  COMPARISON:  None.  FINDINGS: There is no evidence of mass effect, midline shift, or extra-axial fluid collections. There is no evidence of a space-occupying lesion or intracranial hemorrhage. There is no evidence of a cortical-based area of acute infarction. There is generalized cerebral atrophy.  The ventricles and sulci are appropriate for the patient's age. The basal cisterns are patent.  Visualized portions of the orbits are unremarkable.  The visualized portions of the paranasal sinuses and mastoid air cells are unremarkable.  The osseous structures are unremarkable.  IMPRESSION: No acute intracranial pathology.   Electronically Signed   By: Kathreen Devoid   On: 03/31/2014 12:14   Dg Chest Port 1 View  03/31/2014   CLINICAL DATA:  Dizziness and fatigue  EXAM: PORTABLE CHEST - 1 VIEW  COMPARISON:  09/02/2012  FINDINGS: Retrocardiac density compatible with hiatal hernia, stable.  Cardiac and mediastinal margins appear normal. Thoracic spondylosis noted. The lungs appear clear.  IMPRESSION: 1. No acute findings. 2. Stable small hiatal hernia.   Electronically Signed   By: Sherryl Barters M.D.   On: 03/31/2014 11:40     EKG Interpretation   Date/Time:  Wednesday March 31 2014 10:26:14 EDT Ventricular Rate:  92 PR Interval:  158 QRS Duration: 78 QT Interval:  342 QTC Calculation: 422 R Axis:   4 Text Interpretation:  Normal sinus rhythm Inferior infarct , age  undetermined Cannot rule out Anterior infarct , age undetermined Abnormal  ECG No significant change since last tracing Confirmed by Maryela Tapper  MD,  [place] [phone]) on 03/31/2014 10:41:58 AM      MDM   Final diagnoses:  CVA (cerebral vascular accident)    Patient symptoms seem to be consistent with cerebral vascular accident. Head CT negative U. no symptoms started on Saturday. Patient seen in consultation by neurology. They concur that MRI as needed. Patient will require sedation all sets ordered. Disposition as per neurology and MRI results.   Fredia Sorrow, MD 03/31/14 [phone]

## 2014-03-31 NOTE — ED Notes (Signed)
Pt remains in MRI 

## 2014-03-31 NOTE — ED Notes (Signed)
Attempetd report.

## 2014-03-31 NOTE — ED Notes (Addendum)
This RN is in CT with pt to give fentanyl for back pain. Will monitor BP while with pt

## 2014-03-31 NOTE — ED Notes (Signed)
[name], Daughter [phone]

## 2014-03-31 NOTE — Consult Note (Signed)
Referring Physician: Rogene Houston    Chief Complaint: stroke  HPI:                                                                                                                                         Adrian Singh is an 80 y.o. male who noted 5 days ago he had a sudden onset of left hand clumsiness and then decreased sensation. The next day he noted he would become "woozie headed" when standing up. These symptoms did not resolve over the next few days.  Today he awoke and noted his left face was drawn and had decreased sensation. Due to this reason he was brought to ED.  Currently his main complaint is clumsiness in his left hand, decreased sensation in left forearm and hand and transient left facial decreased sensation.  Denies HA, double vision, difficulty swallowing, slurred speech, confusion, language or vision impairment.  Date last known well: Date: 03/27/2014 Time last known well: Unable to determine tPA Given: No: out of window  Past Medical History  Diagnosis Date  . Hypertrophy of prostate without urinary obstruction and other lower urinary tract symptoms (LUTS)   . Type II or unspecified type diabetes mellitus without mention of complication, not stated as uncontrolled   . Esophageal reflux   . Unspecified essential hypertension   . Osteoarthrosis, unspecified whether generalized or localized, unspecified site   . Plantar wart   . Tachycardia, unspecified   . Orthostatic lightheadedness   . Atrial fibrillation     x1  . CAD (coronary artery disease)     nonobstructive  . Chronic kidney disease, stage III (moderate)     Past Surgical History  Procedure Laterality Date  . Esophagogastroduodenoscopy  2001    with dilation  . Back surgery  2001  . Rotator cuff repair  2005    Left    Family History  Problem Relation Age of Onset  . Stroke Father   . Peripheral vascular disease Father     amputation  . Heart failure Mother     CHF  . Coronary artery disease Mother    . Heart attack Mother     Multiple  . COPD Brother   . Heart disease Brother   . Cancer Brother     ?  . Lung cancer Sister    Social History:  reports that he has quit smoking. He has never used smokeless tobacco. He reports that he does not drink alcohol or use illicit drugs.  Allergies: No Known Allergies  Medications:  Current Facility-Administered Medications  Medication Dose Route Frequency Provider Last Rate Last Dose  . 0.9 %  sodium chloride infusion   Intravenous Continuous Fredia Sorrow, MD 100 mL/hr at 03/31/14 1120    . LORazepam (ATIVAN) injection 1 mg  1 mg Intravenous Once Marliss Coots, PA-C       Current Outpatient Prescriptions  Medication Sig Dispense Refill  . aspirin 325 MG tablet Take 325 mg by mouth daily.        . Cholecalciferol (VITAMIN D) 2000 UNITS CAPS Take 2,000 Units by mouth daily.       Marland Kitchen glimepiride (AMARYL) 4 MG tablet Take 2 mg by mouth 2 (two) times daily.      . metFORMIN (GLUMETZA) 500 MG (MOD) 24 hr tablet Take 500 mg by mouth 2 (two) times daily.      . metoprolol succinate (TOPROL-XL) 50 MG 24 hr tablet Take 50 mg by mouth daily. Take with or immediately following a meal.      . Multiple Vitamin (MULTIVITAMIN) capsule Take 1 capsule by mouth daily.      . Naproxen Sodium (ALEVE) 220 MG CAPS Take 220 mg by mouth 2 (two) times daily.       . pantoprazole (PROTONIX) 40 MG tablet Take 40 mg by mouth daily.      . predniSONE (DELTASONE) 20 MG tablet Take 20-40 mg by mouth daily. Starting 03/23/14 40 mg for 5 days, then 20 mg for 5 days      . ramipril (ALTACE) 2.5 MG capsule Take 1 capsule (2.5 mg total) by mouth daily.  90 capsule  3  . tamsulosin (FLOMAX) 0.4 MG CAPS capsule Take 0.4 mg by mouth at bedtime.      . traMADol (ULTRAM) 50 MG tablet Take 1 tablet (50 mg total) by mouth every 8 (eight) hours as needed.  30 tablet   0  . triamterene-hydrochlorothiazide (MAXZIDE-25) 37.5-25 MG per tablet Take 1 tablet by mouth daily.      . vitamin B-12 (CYANOCOBALAMIN) 1000 MCG tablet Take 1,000 mcg by mouth daily.      Marland Kitchen glucose blood test strip Check blood sugar 1-2 times daily and as directed. 250.00  100 each  3  . LANCETS ULTRA THIN MISC Used to test blood sugar 1-2 times daily dx: 250.00  100 each  0     ROS:                                                                                                                                       History obtained from the patient  General ROS: negative for - chills, fatigue, fever, night sweats, weight gain or weight loss Psychological ROS: negative for - behavioral disorder, hallucinations, memory difficulties, mood swings or suicidal ideation Ophthalmic ROS: negative for - blurry vision, double vision, eye pain or loss of vision ENT ROS: negative for - epistaxis, nasal discharge,  oral lesions, sore throat, tinnitus or vertigo Allergy and Immunology ROS: negative for - hives or itchy/watery eyes Hematological and Lymphatic ROS: negative for - bleeding problems, bruising or swollen lymph nodes Endocrine ROS: negative for - galactorrhea, hair pattern changes, polydipsia/polyuria or temperature intolerance Respiratory ROS: negative for - cough, hemoptysis, shortness of breath or wheezing Cardiovascular ROS: negative for - chest pain, dyspnea on exertion, edema or irregular heartbeat Gastrointestinal ROS: negative for - abdominal pain, diarrhea, hematemesis, nausea/vomiting or stool incontinence Genito-Urinary ROS: negative for - dysuria, hematuria, incontinence or urinary frequency/urgency Musculoskeletal ROS: negative for - joint swelling or muscular weakness Neurological ROS: as noted in HPI Dermatological ROS: negative for rash and skin lesion changes  Physical exam: pleasant male in no apparent distress. Blood pressure 120/71, pulse 79, temperature 98.2 F (36.8 C),  temperature source Oral, resp. rate 19, height 5\' 10"  (1.778 m), weight 95.255 kg (210 lb), SpO2 96.00%. Head: normocephalic. Neck: supple, no bruits, no JVD. Cardiac: no murmurs. Lungs: clear. Abdomen: soft, no tender, no mass. Extremities: no edema.  Neurologic Examination:                                                                                                      Mental Status: Alert, oriented, thought content appropriate.  Speech fluent without evidence of aphasia.  Able to follow 3 step commands without difficulty. Cranial Nerves: II: Discs flat bilaterally; Visual fields grossly normal, pupils equal, round, reactive to light and accommodation III,IV, VI: ptosis not present, extra-ocular motions intact bilaterally V,VII: smile symmetric, facial light touch sensation normal bilaterally VIII: hearing normal bilaterally IX,X: gag reflex present XI: bilateral shoulder shrug XII: midline tongue extension without atrophy or fasciculations Motor: Right : Upper extremity   5/5    Left:     Upper extremity   5/5  Lower extremity   5/5     Lower extremity   5/5 --clumsiness of left hand Tone and bulk:normal tone throughout; no atrophy noted Sensory: Pinprick and light touch decreased in the left forearm Deep Tendon Reflexes:  Right: Upper Extremity   Left: Upper extremity   biceps (C-5 to C-6) 2/4   biceps (C-5 to C-6) 2/4 tricep (C7) 2/4    triceps (C7) 2/4 Brachioradialis (C6) 2/4  Brachioradialis (C6) 2/4  Lower Extremity Lower Extremity  quadriceps (L-2 to L-4) 2/4   quadriceps (L-2 to L-4) 2/4 Achilles (S1) 2/4   Achilles (S1) 2/4  Plantars: Right: downgoing   Left: downgoing Cerebellar: normal finger-to-nose,  normal heel-to-shin test Gait: not tested CV: pulses palpable throughout    Lab Results: Basic Metabolic Panel:  Recent Labs Lab 03/31/14 1110  NA 135*  K 4.8  CL 99  CO2 16*  GLUCOSE 103*  BUN 49*  CREATININE 1.78*  CALCIUM 9.1    Liver  Function Tests:  Recent Labs Lab 03/31/14 1110  AST 28  ALT 28  ALKPHOS 61  BILITOT 0.5  PROT 7.0  ALBUMIN 3.8    Recent Labs Lab 03/31/14 1110  LIPASE 280*   No results found for this basename: AMMONIA,  in the last 168 hours  CBC:  Recent Labs Lab 03/31/14 1110  WBC 11.7*  NEUTROABS 8.5*  HGB 14.1  HCT 41.6  MCV 85.6  PLT 136*    Cardiac Enzymes:  Recent Labs Lab 03/31/14 1100  TROPONINI <0.30    Lipid Panel: No results found for this basename: CHOL, TRIG, HDL, CHOLHDL, VLDL, LDLCALC,  in the last 168 hours  CBG: No results found for this basename: GLUCAP,  in the last 168 hours  Microbiology: Results for orders placed during the hospital encounter of 01/06/08  URINE CULTURE     Status: None   Collection Time    01/05/08  6:59 PM      Result Value Ref Range Status   Specimen Description URINE, CLEAN CATCH   Final   Special Requests NONE   Final   Colony Count 20,OOO COLONIES/ML   Final   Culture ESCHERICHIA COLI   Final   Report Status 01/08/2008 FINAL   Final   Organism ID, Bacteria ESCHERICHIA COLI   Final    Coagulation Studies: No results found for this basename: LABPROT, INR,  in the last 72 hours  Imaging: Ct Head Wo Contrast  03/31/2014   CLINICAL DATA:  Left hand weakness  EXAM: CT HEAD WITHOUT CONTRAST  TECHNIQUE: Contiguous axial images were obtained from the base of the skull through the vertex without intravenous contrast.  COMPARISON:  None.  FINDINGS: There is no evidence of mass effect, midline shift, or extra-axial fluid collections. There is no evidence of a space-occupying lesion or intracranial hemorrhage. There is no evidence of a cortical-based area of acute infarction. There is generalized cerebral atrophy.  The ventricles and sulci are appropriate for the patient's age. The basal cisterns are patent.  Visualized portions of the orbits are unremarkable. The visualized portions of the paranasal sinuses and mastoid air cells are  unremarkable.  The osseous structures are unremarkable.  IMPRESSION: No acute intracranial pathology.   Electronically Signed   By: Kathreen Devoid   On: 03/31/2014 12:14   Dg Chest Port 1 View  03/31/2014   CLINICAL DATA:  Dizziness and fatigue  EXAM: PORTABLE CHEST - 1 VIEW  COMPARISON:  09/02/2012  FINDINGS: Retrocardiac density compatible with hiatal hernia, stable.  Cardiac and mediastinal margins appear normal. Thoracic spondylosis noted. The lungs appear clear.  IMPRESSION: 1. No acute findings. 2. Stable small hiatal hernia.   Electronically Signed   By: Sherryl Barters M.D.   On: 03/31/2014 11:40       Assessment and plan discussed with with attending physician and they are in agreement.    Ardit Danh PA-C [facility] [phone]  03/31/2014, 3:19 PM   Assessment: 80 y.o. male with new onset of dizziness (room spinning) left hand decreased sensation, clumsiness and recent transient left facial decreased sensation. With history of Afib and DM patient would benefit from full evaluation for CVA and evaluation of posterior circulation.   Stroke Risk Factors - atrial fibrillation and diabetes mellitus  1. HgbA1c, fasting lipid panel 2. MRI, MRA  of the brain without contrast, MRA neck W/WO contrast 3. PT consult, OT consult, Speech consult 4. Echocardiogram 5. Carotid dopplers 6. Prophylactic therapy-Antiplatelet med: Aspirin - dose 325 mg daily 7. Risk factor modification 8. Telemetry monitoring 9. Frequent neuro checks   Patient seen and examined together with physician assistant and I concur with the assessment and plan.  Dorian Pod, MD

## 2014-03-31 NOTE — H&P (Signed)
[facility] Admission History and Physical       KADARRIUS YANKE FVC:[id number] DOB: 1934-06-29 DOA: 03/31/2014  Referring physician:  EDP PCP: Viviana Simpler, MD  Specialists:  Dr. Lenox Ponds   Chief Complaint:  Weakness and Numbness Left Arm  HPI: Adrian Singh is a 80 y.o. male with a history of CAD, HTN,  DM2, Hyperlipidemia, and BPH who presents tot he ED with complaints of intermittent weakness and numbness of his left arm over 4 days.   He reports that the first episode 4 days ago lasted 30 Minutes, and resolved, but recurred daily and at times he could not control or move his left hand.   He reports having associated headache and dizziness and also noticed numbness of the left side of his face and neck.    He denies having any chest pain.   He was evaluated in the ED and seen by Neurology Dr. Aram Beecham, a CT scan and MRI of the Brain were doen and revealed Multiple areas of Non-hemorrhagic infarction of the right frontal and parietal lobes.   He was referred for admission to further evaluate his CVA and have further risk stratification.      Review of Systems:  Constitutional: No Weight Loss, No Weight Gain, Night Sweats, Fevers, Chills, +Dizziness, Fatigue, or Generalized Weakness HEENT: +Headaches, Difficulty Swallowing,Tooth/Dental Problems,Sore Throat,  No Sneezing, Rhinitis, Ear Ache, Nasal Congestion, or Post Nasal Drip,  Cardio-vascular:  No Chest pain, Orthopnea, PND, Edema in Lower Extremities, Anasarca, Dizziness, Palpitations  Resp: No Dyspnea, No DOE, No Cough, No Hemoptysis, No Wheezing.    GI: No Heartburn, Indigestion, Abdominal Pain, Nausea, Vomiting, Diarrhea, Hematemesis, Hematochezia, Melena, Change in Bowel Habits,  Loss of Appetite  GU: No Dysuria, Change in Color of Urine, No Urgency or Frequency, No Flank pain.  Musculoskeletal: No Joint Pain or Swelling, No Decreased Range of Motion, No Back Pain.  Neurologic: No Syncope, No Seizures, + Weakness Left  Upper Extremity,  +Paresthesia, Vision Disturbance or Loss, No Diplopia, +Vertigo, No Difficulty Walking,  Skin: No Rash or Lesions. Psych: No Change in Mood or Affect, No Depression or Anxiety, No Memory loss, No Confusion, or Hallucinations   Past Medical History  Diagnosis Date  . Hypertrophy of prostate without urinary obstruction and other lower urinary tract symptoms (LUTS)   . Type II or unspecified type diabetes mellitus without mention of complication, not stated as uncontrolled   . Esophageal reflux   . Unspecified essential hypertension   . Osteoarthrosis, unspecified whether generalized or localized, unspecified site   . Plantar wart   . Tachycardia, unspecified   . Orthostatic lightheadedness   . Atrial fibrillation     x1  . CAD (coronary artery disease)     nonobstructive  . Chronic kidney disease, stage III (moderate)     Past Surgical History  Procedure Laterality Date  . Esophagogastroduodenoscopy  2001    with dilation  . Back surgery  2001  . Rotator cuff repair  2005    Left     Prior to Admission medications   Medication Sig Start Date End Date Taking? Authorizing Provider  aspirin 325 MG tablet Take 325 mg by mouth daily.     Yes Historical Provider, MD  Cholecalciferol (VITAMIN D) 2000 UNITS CAPS Take 2,000 Units by mouth daily.    Yes Historical Provider, MD  glimepiride (AMARYL) 4 MG tablet Take 2 mg by mouth 2 (two) times daily.   Yes Historical Provider, MD  metFORMIN Marcial Pacas)  500 MG (MOD) 24 hr tablet Take 500 mg by mouth 2 (two) times daily.   Yes Historical Provider, MD  metoprolol succinate (TOPROL-XL) 50 MG 24 hr tablet Take 50 mg by mouth daily. Take with or immediately following a meal.   Yes Historical Provider, MD  Multiple Vitamin (MULTIVITAMIN) capsule Take 1 capsule by mouth daily.   Yes Historical Provider, MD  Naproxen Sodium (ALEVE) 220 MG CAPS Take 220 mg by mouth 2 (two) times daily.    Yes Historical Provider, MD  pantoprazole  (PROTONIX) 40 MG tablet Take 40 mg by mouth daily.   Yes Historical Provider, MD  predniSONE (DELTASONE) 20 MG tablet Take 20-40 mg by mouth daily. Starting 03/23/14 40 mg for 5 days, then 20 mg for 5 days   Yes Historical Provider, MD  ramipril (ALTACE) 2.5 MG capsule Take 1 capsule (2.5 mg total) by mouth daily. 08/26/13  Yes [name], MD  tamsulosin (FLOMAX) 0.4 MG CAPS capsule Take 0.4 mg by mouth at bedtime.   Yes Historical Provider, MD  traMADol (ULTRAM) 50 MG tablet Take 1 tablet (50 mg total) by mouth every 8 (eight) hours as needed. 03/18/14  Yes Raquel Rey, NP  triamterene-hydrochlorothiazide (MAXZIDE-25) 37.5-25 MG per tablet Take 1 tablet by mouth daily.   Yes Historical Provider, MD  vitamin B-12 (CYANOCOBALAMIN) 1000 MCG tablet Take 1,000 mcg by mouth daily.   Yes Historical Provider, MD  glucose blood test strip Check blood sugar 1-2 times daily and as directed. 250.00 11/19/12   [name], MD  LANCETS ULTRA THIN MISC Used to test blood sugar 1-2 times daily dx: 250.00 11/19/12   [name], MD     No Known Allergies   Social History:  reports that he has quit smoking. He has never used smokeless tobacco. He reports that he does not drink alcohol or use illicit drugs.     Family History  Problem Relation Age of Onset  . Stroke Father   . Peripheral vascular disease Father     amputation  . Heart failure Mother     CHF  . Coronary artery disease Mother   . Heart attack Mother     Multiple  . COPD Brother   . Heart disease Brother   . Cancer Brother     ?  . Lung cancer Sister        Physical Exam:  GEN:  Pleasant Elderly Well Nourished and well Developed  80 y.o. Caucasian male examined and in no acute distress; cooperative with exam Filed Vitals:   03/31/14 1918 03/31/14 1945 03/31/14 2000 03/31/14 2002  BP: 100/69 118/73 154/71 154/71  Pulse: 69 67 68 67  Temp:      TempSrc:      Resp: 12 18 14 15   Height:      Weight:      SpO2: 96% 95%  98% 98%   Blood pressure 154/71, pulse 67, temperature 98.2 F (36.8 C), temperature source Oral, resp. rate 15, height 5\' 10"  (1.778 m), weight 95.255 kg (210 lb), SpO2 98.00%. PSYCH: He is alert and oriented x4; does not appear anxious does not appear depressed; affect is normal HEENT: Normocephalic and Atraumatic, Mucous membranes pink; PERRLA; EOM intact; Fundi:  Benign;  No scleral icterus, Nares: Patent, Oropharynx: Clear, Fair Dentition, Neck:  FROM, No Cervical Lymphadenopathy nor Thyromegaly or Carotid Bruit; No JVD; Breasts:: Not examined CHEST WALL: No tenderness CHEST: Normal respiration, clear to auscultation bilaterally HEART: Regular rate  and rhythm; no murmurs rubs or gallops BACK: No kyphosis or scoliosis; No CVA tenderness ABDOMEN: Positive Bowel Sounds, Soft Non-Tender; No Masses, No Organomegaly. Rectal Exam: Not done EXTREMITIES: No Cyanosis, Clubbing, or Edema; No Ulcerations. Genitalia: not examined PULSES: 2+ and symmetric SKIN: Normal hydration no rash or ulceration  CNS:   Mental Status:  Alert, Oriented, Thought Content Appropriate. Speech Fluent without evidence of Aphasia.   Able to follow 3 step commands without difficulty.  In No obvious pain.   Cranial Nerves:  II: Discs flat bilaterally; Visual fields Intact, Pupils equal and reactive.    III,IV, VI: Extra-ocular motions intact bilaterally    V,VII: smile symmetric, facial light touch sensation normal bilaterally    VIII: hearing intact bilaterally    IX,X: gag reflex present    XI: bilateral shoulder shrug    XII: midline tongue extension   Motor:  Right:  Upper extremity 5/5     Left:  Upper extremity 4/5     Right:  Lower extremity 5/5    Left:  Lower extremity 5/5     Tone and Bulk:  normal tone throughout; no atrophy noted   Sensory:  Pinprick and light touch intact throughout, bilaterally   Deep Tendon Reflexes: 2+ and symmetric throughout   Plantars/ Babinski:  Right: equivocal     Left:  equivocal     Cerebellar:  Finger to nose without difficulty.   Gait: deferred   Vascular: pulses palpable throughout     Labs on Admission:  Basic Metabolic Panel:  Recent Labs Lab 03/31/14 1110  NA 135*  K 4.8  CL 99  CO2 16*  GLUCOSE 103*  BUN 49*  CREATININE 1.78*  CALCIUM 9.1   Liver Function Tests:  Recent Labs Lab 03/31/14 1110  AST 28  ALT 28  ALKPHOS 61  BILITOT 0.5  PROT 7.0  ALBUMIN 3.8    Recent Labs Lab 03/31/14 1110  LIPASE 280*   No results found for this basename: AMMONIA,  in the last 168 hours CBC:  Recent Labs Lab 03/31/14 1110  WBC 11.7*  NEUTROABS 8.5*  HGB 14.1  HCT 41.6  MCV 85.6  PLT 136*   Cardiac Enzymes:  Recent Labs Lab 03/31/14 1100  TROPONINI <0.30    BNP (last 3 results)  Recent Labs  03/31/14 1100  PROBNP 179.5   CBG: No results found for this basename: GLUCAP,  in the last 168 hours  Radiological Exams on Admission: Ct Head Wo Contrast  03/31/2014   CLINICAL DATA:  Left hand weakness  EXAM: CT HEAD WITHOUT CONTRAST  TECHNIQUE: Contiguous axial images were obtained from the base of the skull through the vertex without intravenous contrast.  COMPARISON:  None.  FINDINGS: There is no evidence of mass effect, midline shift, or extra-axial fluid collections. There is no evidence of a space-occupying lesion or intracranial hemorrhage. There is no evidence of a cortical-based area of acute infarction. There is generalized cerebral atrophy.  The ventricles and sulci are appropriate for the patient's age. The basal cisterns are patent.  Visualized portions of the orbits are unremarkable. The visualized portions of the paranasal sinuses and mastoid air cells are unremarkable.  The osseous structures are unremarkable.  IMPRESSION: No acute intracranial pathology.   Electronically Signed   By: Kathreen Devoid   On: 03/31/2014 12:14   Mr Brain Wo Contrast  03/31/2014   CLINICAL DATA:  Left hand clumsiness. Decreased  sensation in the left forearm. New onset of dizziness.  Transient left facial decreased sensation.  EXAM: MRI HEAD WITHOUT CONTRAST  TECHNIQUE: Multiplanar, multiecho pulse sequences of the brain and surrounding structures were obtained without intravenous contrast.  COMPARISON:  CT head from the same day.  FINDINGS: The diffusion-weighted images demonstrate multiple foci of restricted diffusion within the right frontal and parietal lobe. These lateral scratch the these follow a watershed distribution. There is an additional lesion more posteriorly in the posterior right temporal lobe measuring 11 mm. No left-sided infarcts are present.  No hemorrhage or mass lesion is present. The study is moderately degraded by patient motion. T2 changes are associated with the areas of acute infarction. Minimal atrophy and white matter disease is otherwise within normal limits for age.  Flow is present in the major intracranial arteries. The globes and orbits are intact. The paranasal sinuses are clear. There is some fluid in the left mastoid air cells. No obstructing nasopharyngeal lesion is evident.  IMPRESSION: 1. Multiple focal areas of acute non hemorrhagic infarction involving the right frontal and parietal lobe. These may represent focal embolic infarcts. The watershed event is also considered. 2. Additional 11 mm infarct in the posterior right temporal lobe suggests the lesions are more likely embolic. 3. No hemorrhage or mass lesion. 4. Minimal atrophy and white matter disease otherwise is within normal limits for age. These results were called by telephone at the time of interpretation on 03/31/2014 at 7:38 pm to Dr. Alexis Goodell , who verbally acknowledged these results.   Electronically Signed   By: Lawrence Santiago M.D.   On: 03/31/2014 19:38   Dg Chest Port 1 View  03/31/2014   CLINICAL DATA:  Dizziness and fatigue  EXAM: PORTABLE CHEST - 1 VIEW  COMPARISON:  09/02/2012  FINDINGS: Retrocardiac density compatible  with hiatal hernia, stable.  Cardiac and mediastinal margins appear normal. Thoracic spondylosis noted. The lungs appear clear.  IMPRESSION: 1. No acute findings. 2. Stable small hiatal hernia.   Electronically Signed   By: Sherryl Barters M.D.   On: 03/31/2014 11:40     EKG: Independently reviewed. Normal Sinus Rhythm rate 92, previous Inferior Infarct.   No Acute Changes.      Assessment/Plan:   80 y.o. male with  Principal Problem: 1.   CVA (cerebral infarction):     Admitted to complete CVA workup,  2D ECHO, and Carotid US in AM, also   check HbA1C and Fasting Lipids.    Continue Full dose ASA Rx, and Further Rx   per Neuro Recommendations.     Active Problems: 2.   CAD (coronary artery disease):    Stable, currently on Full dose ASA RX, and Metoprolol , and Ramipril Rx.     3.   Type II or unspecified type diabetes mellitus with neurological manifestations, not stated as uncontrolled(250.60):    On Metformin and Glimiperide Rx,  Hold for now,  SSI coverage PRN, and check HbA1c in AM.       4.   Chronic kidney disease, stage III (moderate):    Monitor BUN/CR levels,  Discontinued Metformin, and Ramipril RX for now.      5.   HYPERTENSION:    On Metoprolol and Ramipril Rx ( Ramipril on Hold), Monitor BPs.      6.   Hyperlipidemia-    Check Fasting Lipids, start low dose Statin rx, and adjust PRN.      7.   BENIGN PROSTATIC HYPERTROPHY:    Continue Tamsulosin Rx.      8.  Thrombocytopenia- monitor Platelets.       9.   DVT Prophylaxis:    Lovenox , Monitor Platelets.         Code Status:    FULL CODE  Family Communication:    Wife and Daughter at Bedside Disposition Plan:     Observation    Time spent:  [facility] Pager [phone]   If [place] Please Contact the Day Rounding Team MD for [facility]  If 7PM-7AM, Please Contact night-coverage  [email or web address] Password [email or web address] 03/31/2014, 9:09 PM

## 2014-03-31 NOTE — ED Notes (Addendum)
Pt reports having shaking/twitching to left hand that started on Saturday. Now reports intermittent episodes fatigue, weakness, dizziness. No neuro deficits noted at this time, grips are equal. ekg done at triage. Dizziness is more severe when he first stands up, has history of vertigo but states this feels different.

## 2014-03-31 NOTE — ED Notes (Signed)
MD at bedside. 

## 2014-04-01 ENCOUNTER — Inpatient Hospital Stay (HOSPITAL_COMMUNITY): Payer: Medicare Other

## 2014-04-01 DIAGNOSIS — I517 Cardiomegaly: Secondary | ICD-10-CM

## 2014-04-01 LAB — GLUCOSE, CAPILLARY
Glucose-Capillary: 78 mg/dL (ref 70–99)
Glucose-Capillary: 138 mg/dL — ABNORMAL HIGH (ref 70–99)
Glucose-Capillary: 197 mg/dL — ABNORMAL HIGH (ref 70–99)
Glucose-Capillary: 211 mg/dL — ABNORMAL HIGH (ref 70–99)

## 2014-04-01 LAB — LIPID PANEL
Cholesterol: 100 mg/dL (ref 0–200)
HDL: 28 mg/dL — ABNORMAL LOW (ref >39)
LDL Cholesterol: 47 mg/dL (ref 0–99)
Total CHOL/HDL Ratio: 3.6 RATIO
Triglycerides: 123 mg/dL (ref <150)
VLDL: 25 mg/dL (ref 0–40)

## 2014-04-01 LAB — HEMOGLOBIN A1C
Hgb A1c MFr Bld: 6.4 % — ABNORMAL HIGH (ref <5.7)
Mean Plasma Glucose: 137 mg/dL — ABNORMAL HIGH (ref <117)

## 2014-04-01 MED ORDER — GADOBENATE DIMEGLUMINE 529 MG/ML IV SOLN
10.0000 mL | Freq: Once | INTRAVENOUS | Status: AC
  Administered 2014-04-01: 10 mL via INTRAVENOUS

## 2014-04-01 MED ORDER — LORAZEPAM 1 MG PO TABS
1.0000 mg | ORAL_TABLET | Freq: Once | ORAL | Status: AC
  Administered 2014-04-01: 1 mg via ORAL
  Filled 2014-04-01: qty 1

## 2014-04-01 NOTE — Progress Notes (Signed)
Stroke Team Progress Note  HISTORY Adrian Singh is an 80 y.o. male who noted 5 days ago 03/27/2014, time unknown, he had a sudden onset of left hand clumsiness and then decreased sensation. The next day he noted he would become "woozie headed" when standing up. These symptoms did not resolve over the next few days. Today 03/31/2014 he awoke and noted his left face was drawn and had decreased sensation. Due to this reason he was brought to ED. Currently his main complaint is clumsiness in his left hand, decreased sensation in left forearm and hand and transient left facial decreased sensation.  Denies HA, double vision, difficulty swallowing, slurred speech, confusion, language or vision impairment. Patient was not administered TPA secondary to delay in arrival. He was admitted for further evaluation and treatment. The Stroke Team is consulting.  SUBJECTIVE His wife is at the bedside.  Overall he feels his condition is gradually improving. He still has mild numbness and hand weakness. He has h/o atrial fib but has not been on long term warfarin.  OBJECTIVE Most recent Vital Signs: Filed Vitals:   04/01/14 0200 04/01/14 0400 04/01/14 0627 04/01/14 0642  BP: 138/74 170/81 98/54 130/66  Pulse: 65  69 69  Temp:   97.5 F (36.4 C)   TempSrc:   Oral   Resp:   16   Height:      Weight:      SpO2: 100%  97%    CBG (last 3)   Recent Labs  03/31/14 2240  GLUCAP 195*    IV Fluid Intake:   . sodium chloride 100 mL/hr at 03/31/14 2341    MEDICATIONS  .  stroke: mapping our early stages of recovery book   Does not apply Once  . aspirin  300 mg Rectal Daily   Or  . aspirin  325 mg Oral Daily  . cholecalciferol  2,000 Units Oral Daily  . enoxaparin (LOVENOX) injection  30 mg Subcutaneous Q24H  . insulin aspart  0-9 Units Subcutaneous 6 times per day  . metoprolol succinate  50 mg Oral Daily  . multivitamin with minerals  1 tablet Oral Daily  . pantoprazole  40 mg Oral Daily  . tamsulosin   0.4 mg Oral QHS   PRN:  acetaminophen, fentaNYL, LORazepam, ondansetron (ZOFRAN) IV, oxyCODONE, senna-docusate, zolpidem  Diet:  Carb Control thin liquids Activity:  Bedrest DVT Prophylaxis:  Lovenox 30 mg sq daily   CLINICALLY SIGNIFICANT STUDIES Basic Metabolic Panel:  Recent Labs Lab 03/31/14 1110  NA 135*  K 4.8  CL 99  CO2 16*  GLUCOSE 103*  BUN 49*  CREATININE 1.78*  CALCIUM 9.1   Liver Function Tests:  Recent Labs Lab 03/31/14 1110  AST 28  ALT 28  ALKPHOS 61  BILITOT 0.5  PROT 7.0  ALBUMIN 3.8   CBC:  Recent Labs Lab 03/31/14 1110  WBC 11.7*  NEUTROABS 8.5*  HGB 14.1  HCT 41.6  MCV 85.6  PLT 136*   Coagulation: No results found for this basename: LABPROT, INR,  in the last 168 hours Cardiac Enzymes:  Recent Labs Lab 03/31/14 1100  TROPONINI <0.30   Urinalysis:  Recent Labs Lab 03/31/14 1228  COLORURINE YELLOW  LABSPEC 1.020  PHURINE 6.0  GLUCOSEU NEGATIVE  HGBUR NEGATIVE  BILIRUBINUR NEGATIVE  KETONESUR NEGATIVE  PROTEINUR NEGATIVE  UROBILINOGEN 1.0  NITRITE NEGATIVE  LEUKOCYTESUR NEGATIVE   Lipid Panel    Component Value Date/Time   CHOL 157 08/26/2013 1022   TRIG  236.0* 08/26/2013 1022   HDL 35.30* 08/26/2013 1022   CHOLHDL 4 08/26/2013 1022   VLDL 47.2* 08/26/2013 1022   LDLCALC 81 12/23/2009 0000   HgbA1C  Lab Results  Component Value Date   HGBA1C 6.6* 03/09/2014    Urine Drug Screen:   No results found for this basename: labopia, cocainscrnur, labbenz, amphetmu, thcu, labbarb    Alcohol Level: No results found for this basename: ETH,  in the last 168 hours    CT of the brain  03/31/2014    No acute intracranial pathology.      MRI of the brain  03/31/2014   1. Multiple focal areas of acute non hemorrhagic infarction involving the right frontal and parietal lobe. These may represent focal embolic infarcts. The watershed event is also considered. 2. Additional 11 mm infarct in the posterior right temporal lobe suggests  the lesions are more likely embolic. 3. No hemorrhage or mass lesion. 4. Minimal atrophy and white matter disease otherwise is within normal limits for age.  Carotid Doppler  pending  2D Echocardiogram  pending  CXR  03/31/2014    1. No acute findings. 2. Stable small hiatal hernia.  EKG  normal sinus rhythm. For complete results please see formal report.   Therapy Recommendations pending Physical Exam  Pleasant elderly caucasian male not in distress.Awake alert. Afebrile. Head is nontraumatic. Neck is supple without bruit. Hearing is normal. Cardiac exam no murmur or gallop. Lungs are clear to auscultation. Distal pulses are well felt. Neurological Exam : Awake alert oriented x 3 normal speech and language. Mild left lower face asymmetry. Tongue midline. No drift. Mild diminished fine finger movements on left. Orbits right over left upper extremity. Mild left grip weak.. Normal sensation . Normal coordination. ASSESSMENT [name] is a 80 y.o. male presenting with left hand clumsiness, decreased sensation and left face "drawn". 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 Infarcts felt to be  embolic secondary to atrial fibrillation.  On aspirin 325 mg orally every day prior to admission. Now on aspirin 325 mg orally every day for secondary stroke prevention. Patient with resultant  Mild left  Face and hand weakness Stroke work up underway.  Unable to tolerate MRAs secondary to severe back pain   atrial fibrillation, not on anticoagulation prior to admission secondary to  Unknown reason hypertension Hyperlipidemia, LDL pending, on no statin PTA, now on statin rx, goal LDL < 70 for diabetics.  Diabetes, HgbA1c 6.4%, goal < 7.0 CAD Family hx stroke (father)  GERD CKD, stage III BPH  thrombocytopenia   Hospital day # 1  TREATMENT/PLAN  Consider starting eliquis for secondary stroke prevention given h/o atrial fibrillation.adjust  dose per pharmacy  F/u Carotd doppler, 2D, HgbA1c, lipids             Vascular imaging  Therapy evals  Mobilize out of bed  D/W patient and wife      I, the attending vascular neurologist, have personally obtained a history, examined the patient, evaluated laboratory data and imaging studies, and formulated the assessment and plan of care.  I have made any additions or clarifications directly to the above note and agree with the findings and plan as currently documented. SIGNED Antony Contras, MD  To contact Stroke Continuity provider, please refer to [email or web address] After hours, contact General Neurology

## 2014-04-01 NOTE — Evaluation (Signed)
Physical Therapy Evaluation Patient Details Name: Adrian Singh MRN: [id number] DOB: 12-31-1933 Today's Date: 04/01/2014   History of Present Illness  Adm 03/31/14 with Lt face and hand numbness, with clumsiness of Lt hand. MRI +multiple ischemic infarcts of Rt frontal, temporal, and parietal lobes MRA possible stenosis of bil vertebral arteries (suboptimal quality of exam) PMHx-afib (not on anticoagulant for unknown reason), DM, CKD, CVA  Clinical Impression  Pt admitted with Rt CVA and primarily limited by central vertigo symptoms. Pt provided with education re: function of vestibular system and agrees to continue with OPPT for vestibular rehab. Pt currently with functional limitations due to the deficits listed below (see PT Problem List).  Pt will benefit from skilled PT to increase their independence and safety with mobility to allow discharge to the venue listed below.       Follow Up Recommendations Outpatient PT for VESTIBULAR REHAB;Supervision for mobility/OOB    Equipment Recommendations  Rolling walker with 5" wheels    Recommendations for Other Services OT consult     Precautions / Restrictions Precautions Precautions: Fall      Mobility  Bed Mobility Overal bed mobility: Modified Independent                Transfers Overall transfer level: Needs assistance Equipment used: Rolling walker (2 wheeled) Transfers: Sit to/from Stand Sit to Stand: Min guard         General transfer comment: for safety due to vertigo/dizziness  Ambulation/Gait Ambulation/Gait assistance: Min assist Ambulation Distance (Feet): 15 Feet (x 4) Assistive device: Rolling walker (2 wheeled) Gait Pattern/deviations: Step-through pattern;Decreased stride length;Shuffle;Wide base of support   Gait velocity interpretation: Below normal speed for age/gender General Gait Details: pt looking down at floor to minimize vertigo symptoms; mostly feels things are moving towards and away from  him, sometimes spinning or just side to side  Stairs            Wheelchair Mobility    Modified Rankin (Stroke Patients Only) Modified Rankin (Stroke Patients Only) Pre-Morbid Rankin Score: No symptoms Modified Rankin: Moderately severe disability     Balance Overall balance assessment: Needs assistance Sitting-balance support: Feet supported;No upper extremity supported Sitting balance-Leahy Scale: Good     Standing balance support: No upper extremity supported;During functional activity Standing balance-Leahy Scale: Poor Standing balance comment: incr sway in static stance         Rhomberg - Eyes Opened: 30 (minguard assist with incr sway) Rhomberg - Eyes Closed: 15 (minguard assist with incr sway) High level balance activites: Side stepping;Backward walking;Direction changes;Turns;Head turns High Level Balance Comments: very guarded with movements and requires cues for safe use of RW             Pertinent Vitals/Pain +vertigo with some nausea with incr activity    Home Living Family/patient expects to be discharged to:: Private residence Living Arrangements: Spouse/significant other Available Help at Discharge: Family;Friend(s);Available 24 hours/day Type of Home: House Home Access: Stairs to enter Entrance Stairs-Rails: None Entrance Stairs-Number of Steps: 2 Home Layout: One level Home Equipment: Walker - 2 wheels;Cane - single point      Prior Function Level of Independence: Independent         Comments: no balance problems PTA     Hand Dominance   Dominant Hand: Right    Extremity/Trunk Assessment   Upper Extremity Assessment: Defer to OT evaluation           Lower Extremity Assessment: LLE deficits/detail   LLE Deficits /  Details: hip flexion 4/5, knee extension 4/5, knee flexion 3+/5, DF 5/5  Cervical / Trunk Assessment: Kyphotic  Communication   Communication: No difficulties  Cognition Arousal/Alertness:  Awake/alert Behavior During Therapy: WFL for tasks assessed/performed Overall Cognitive Status: Within Functional Limits for tasks assessed                      General Comments General comments (skin integrity, edema, etc.): Lengthy education re: vestibular system and the fact his symptoms are consistent with central vertigo (constant; exacerbated with any movement, however even at rest for >30 minutes still present). Educated on options for continued therapy and pt wants to try OPPT.    Exercises        Assessment/Plan    PT Assessment Patient needs continued PT services  PT Diagnosis Difficulty walking   PT Problem List Decreased strength;Decreased activity tolerance;Decreased balance;Decreased mobility;Decreased knowledge of use of DME;Decreased knowledge of precautions;Impaired sensation;Other (comment) (central vertigo)  PT Treatment Interventions DME instruction;Gait training;Stair training;Functional mobility training;Therapeutic activities;Therapeutic exercise;Balance training;Neuromuscular re-education;Patient/family education   PT Goals (Current goals can be found in the Care Plan section) Acute Rehab PT Goals Patient Stated Goal: go home and continue therapy as an outpt (not via HHPT) PT Goal Formulation: With patient Time For Goal Achievement: 04/02/14 Potential to Achieve Goals: Good    Frequency Min 3X/week   Barriers to discharge        Co-evaluation               End of Session Equipment Utilized During Treatment: Gait belt Activity Tolerance: Treatment limited secondary to medical complications (Comment) (vertigo/dysequilibrium) Patient left: in bed;with call bell/phone within reach;with family/visitor present;Other (comment) (ultrasound study to be completed) Nurse Communication: Mobility status;Other (comment) (need for OPPT and RW)         Time: 1540-1630 PT Time Calculation (min): 50 min   Charges:   PT Evaluation $Initial PT  Evaluation Tier I: 1 Procedure PT Treatments $Gait Training: 23-37 mins $Therapeutic Activity: 8-22 mins   PT G Codes:          Adrian Singh 05-01-2014, 4:55 PM Pager [phone]

## 2014-04-01 NOTE — Progress Notes (Signed)
OT Cancellation Note  Patient Details Name: Adrian Singh MRN: [id number] DOB: [birth year]   Cancelled Treatment:    Reason Eval/Treat Not Completed: Patient at procedure or test/ unavailable (Will continue to follow.)  Malka So 04/01/2014, 1:55 PM

## 2014-04-01 NOTE — Progress Notes (Signed)
*  PRELIMINARY RESULTS* Vascular Ultrasound Carotid Duplex (Doppler) has been completed.  Preliminary findings: Bilateral:  1-39% ICA stenosis.  Vertebral artery flow is antegrade.      Palmira Stickle FRANCES 04/01/2014, 6:54 PM

## 2014-04-01 NOTE — Progress Notes (Addendum)
PT Cancellation Note  Patient Details Name: Adrian Singh MRN: [id number] DOB: 11/20/1933   Cancelled Treatment:    Reason Eval/Treat Not Completed: Patient at procedure or test/unavailable. 1) 10:40 am Working with Speech Therapy. Will return. 2) 14:15 at MRI     [name] 04/01/2014, 10:41 AM Pager [phone]

## 2014-04-01 NOTE — Progress Notes (Signed)
PROGRESS NOTE  Adrian Singh DTO:[id number] DOB: 03/24/1934 DOA: 03/31/2014 PCP: Viviana Simpler, MD  Assessment/Plan: CVA (cerebral infarction):  Admitted to complete CVA workup 2D ECHO- once back, will start eliquis Carotid US   check HbA1C and Fasting Lipids.  -Full dose ASA Rx -neuro eval  -Pt/OT/SLP eval  CAD (coronary artery disease):  Stable, currently on Full dose ASA RX, and Metoprolol , and Ramipril Rx.   Type II or unspecified type diabetes mellitus with neurological manifestations, not stated as uncontrolled(250.60):  On Metformin and Glimiperide Rx, Hold for now, SSI coverage PRN, and check HbA1c in AM.   Chronic kidney disease, stage III (moderate):  Monitor BUN/CR levels   HYPERTENSION:  On Metoprolol and Ramipril Rx ( Ramipril on Hold), Monitor BPs.   Hyperlipidemia-  Check Fasting Lipids, start low dose Statin rx, and adjust PRN.   BENIGN PROSTATIC HYPERTROPHY:  Continue Tamsulosin Rx.   Thrombocytopenia- monitor Platelets.   Elevated lipase- check in AM No abd pain   Code Status: full Family Communication: patient, son and wife Disposition Plan:    Consultants:  neuro  Procedures:     HPI/Subjective: Feeling well No SOB, no CP  Objective: Filed Vitals:   04/01/14 0943  BP: 118/58  Pulse: 90  Temp:   Resp:     Intake/Output Summary (Last 24 hours) at 04/01/14 1227 Last data filed at 04/01/14 0626  Gross per 24 hour  Intake 1766.67 ml  Output    950 ml  Net 816.67 ml   Filed Weights   03/31/14 1026  Weight: 95.255 kg (210 lb)    Exam:   General:  A+Ox3, NAD  Cardiovascular: rrr  Respiratory: clear  Abdomen: +BS, soft  Musculoskeletal: no edema   Data Reviewed: Basic Metabolic Panel:  Recent Labs Lab 03/31/14 1110  NA 135*  K 4.8  CL 99  CO2 16*  GLUCOSE 103*  BUN 49*  CREATININE 1.78*  CALCIUM 9.1   Liver Function Tests:  Recent Labs Lab 03/31/14 1110  AST 28  ALT 28  ALKPHOS 61  BILITOT  0.5  PROT 7.0  ALBUMIN 3.8    Recent Labs Lab 03/31/14 1110  LIPASE 280*   No results found for this basename: AMMONIA,  in the last 168 hours CBC:  Recent Labs Lab 03/31/14 1110  WBC 11.7*  NEUTROABS 8.5*  HGB 14.1  HCT 41.6  MCV 85.6  PLT 136*   Cardiac Enzymes:  Recent Labs Lab 03/31/14 1100  TROPONINI <0.30   BNP (last 3 results)  Recent Labs  03/31/14 1100  PROBNP 179.5   CBG:  Recent Labs Lab 03/31/14 2240 04/01/14 0727 04/01/14 1144  GLUCAP 195* 78 138*    No results found for this or any previous visit (from the past 240 hour(s)).   Studies: Ct Head Wo Contrast  03/31/2014   CLINICAL DATA:  Left hand weakness  EXAM: CT HEAD WITHOUT CONTRAST  TECHNIQUE: Contiguous axial images were obtained from the base of the skull through the vertex without intravenous contrast.  COMPARISON:  None.  FINDINGS: There is no evidence of mass effect, midline shift, or extra-axial fluid collections. There is no evidence of a space-occupying lesion or intracranial hemorrhage. There is no evidence of a cortical-based area of acute infarction. There is generalized cerebral atrophy.  The ventricles and sulci are appropriate for the patient's age. The basal cisterns are patent.  Visualized portions of the orbits are unremarkable. The visualized portions of the paranasal sinuses and mastoid air  cells are unremarkable.  The osseous structures are unremarkable.  IMPRESSION: No acute intracranial pathology.   Electronically Signed   By: Kathreen Devoid   On: 03/31/2014 12:14   Mr Brain Wo Contrast  03/31/2014   CLINICAL DATA:  Left hand clumsiness. Decreased sensation in the left forearm. New onset of dizziness. Transient left facial decreased sensation.  EXAM: MRI HEAD WITHOUT CONTRAST  TECHNIQUE: Multiplanar, multiecho pulse sequences of the brain and surrounding structures were obtained without intravenous contrast.  COMPARISON:  CT head from the same day.  FINDINGS: The  diffusion-weighted images demonstrate multiple foci of restricted diffusion within the right frontal and parietal lobe. These lateral scratch the these follow a watershed distribution. There is an additional lesion more posteriorly in the posterior right temporal lobe measuring 11 mm. No left-sided infarcts are present.  No hemorrhage or mass lesion is present. The study is moderately degraded by patient motion. T2 changes are associated with the areas of acute infarction. Minimal atrophy and white matter disease is otherwise within normal limits for age.  Flow is present in the major intracranial arteries. The globes and orbits are intact. The paranasal sinuses are clear. There is some fluid in the left mastoid air cells. No obstructing nasopharyngeal lesion is evident.  IMPRESSION: 1. Multiple focal areas of acute non hemorrhagic infarction involving the right frontal and parietal lobe. These may represent focal embolic infarcts. The watershed event is also considered. 2. Additional 11 mm infarct in the posterior right temporal lobe suggests the lesions are more likely embolic. 3. No hemorrhage or mass lesion. 4. Minimal atrophy and white matter disease otherwise is within normal limits for age. These results were called by telephone at the time of interpretation on 03/31/2014 at 7:38 pm to Dr. Alexis Goodell , who verbally acknowledged these results.   Electronically Signed   By: Lawrence Santiago M.D.   On: 03/31/2014 19:38   Dg Chest Port 1 View  03/31/2014   CLINICAL DATA:  Dizziness and fatigue  EXAM: PORTABLE CHEST - 1 VIEW  COMPARISON:  09/02/2012  FINDINGS: Retrocardiac density compatible with hiatal hernia, stable.  Cardiac and mediastinal margins appear normal. Thoracic spondylosis noted. The lungs appear clear.  IMPRESSION: 1. No acute findings. 2. Stable small hiatal hernia.   Electronically Signed   By: Sherryl Barters M.D.   On: 03/31/2014 11:40    Scheduled Meds: . aspirin  300 mg Rectal Daily     Or  . aspirin  325 mg Oral Daily  . cholecalciferol  2,000 Units Oral Daily  . enoxaparin (LOVENOX) injection  30 mg Subcutaneous Q24H  . insulin aspart  0-9 Units Subcutaneous 6 times per day  . LORazepam  1 mg Oral Once  . metoprolol succinate  50 mg Oral Daily  . multivitamin with minerals  1 tablet Oral Daily  . pantoprazole  40 mg Oral Daily  . tamsulosin  0.4 mg Oral QHS   Continuous Infusions: . sodium chloride 100 mL/hr at 04/01/14 0940   Antibiotics Given (last 72 hours)   None      Principal Problem:   CVA (cerebral infarction) Active Problems:   HYPERTENSION   BENIGN PROSTATIC HYPERTROPHY   CAD (coronary artery disease)   Type II or unspecified type diabetes mellitus with neurological manifestations, not stated as uncontrolled(250.60)   Chronic kidney disease, stage III (moderate)    Time spent: 35 min    Anuhea Gassner  [facility] Pager [phone]. If 7PM-7AM, please contact night-coverage at  [email or web address], password [facility] 04/01/2014, 12:27 PM  LOS: 1 day

## 2014-04-01 NOTE — Progress Notes (Signed)
Echocardiogram 2D Echocardiogram has been performed.  Adrian Singh 04/01/2014, 9:15 AM

## 2014-04-01 NOTE — Progress Notes (Signed)
Utilization review completed. Ivett Luebbe, RN, BSN. 

## 2014-04-01 NOTE — Evaluation (Signed)
Speech Language Pathology Evaluation Patient Details Name: Adrian Singh MRN: [id number] DOB: August 15, 1934 Today's Date: 04/01/2014 Time: 0301-3143 SLP Time Calculation (min): 30 min  Problem List:  Patient Active Problem List   Diagnosis Date Noted  . CVA (cerebral infarction) 03/31/2014  . DM2 (diabetes mellitus, type 2) 03/31/2014  . Left sided sciatica 03/23/2014  . Chronic kidney disease, stage III (moderate)   . Diabetes, polyneuropathy 08/26/2013  . Type II or unspecified type diabetes mellitus with neurological manifestations, not stated as uncontrolled(250.60) 11/05/2011  . CAD (coronary artery disease)   . Orthostatic lightheadedness   . Routine general medical examination at a health care facility 05/07/2011  . Actinic keratosis 05/07/2011  . HYPERTENSION 12/23/2009  . GERD 12/23/2009  . BENIGN PROSTATIC HYPERTROPHY 12/23/2009  . OSTEOARTHRITIS 12/23/2009   Past Medical History:  Past Medical History  Diagnosis Date  . Hypertrophy of prostate without urinary obstruction and other lower urinary tract symptoms (LUTS)   . Type II or unspecified type diabetes mellitus without mention of complication, not stated as uncontrolled   . Esophageal reflux   . Unspecified essential hypertension   . Osteoarthrosis, unspecified whether generalized or localized, unspecified site   . Plantar wart   . Tachycardia, unspecified   . Orthostatic lightheadedness   . Atrial fibrillation     x1  . CAD (coronary artery disease)     nonobstructive  . Chronic kidney disease, stage III (moderate)    Past Surgical History:  Past Surgical History  Procedure Laterality Date  . Esophagogastroduodenoscopy  2001    with dilation  . Back surgery  2001  . Rotator cuff repair  2005    Left   HPI:  Adrian Singh is a 80 y.o. male with a history of CAD, HTN, DM2, Hyperlipidemia, and BPH who presents tot he ED with complaints of intermittent weakness and numbness of his left arm over 4 days.  He reports that the first episode 4 days ago lasted 30 Minutes, and resolved, but recurred daily and at times he could not control or move his left hand. He reports having associated headache and dizziness and also noticed numbness of the left side of his face and neck. He denies having any chest pain. CT scan and MRI of the Brain were done and revealed multiple areas of non-hemorrhagic infarction of the right frontal and parietal lobes along wiht 11 mm R posterior temporal infarct suggestive of embolic lesions. SLP eval ordered as part of stroke workup.   Assessment / Plan / Recommendation Clinical Impression  Pt's overall motor speech and language skills within functional limits for tasks assessed- voice hoarse with glottal fry present which pt admits to be present prior to stroke. Mild cognitive impairments noted today with decreased ST memory and disoriented to time; however he did state the date that was on calendar in room which was incorrect. Problem solving, safety awareness intact for tasks assessed. No organization/ pragmatic difficulties noted in conversation. Discussed memory, and pt reported that prior to stroke he had begun to have minor difficulties with short-term recall. Given the mild nature of his short term memory difficulties with likely difficulties prior to admission, acute speech therapy is not warranted at this time, and speech will sign off. Educated pt on memory strategies and the option of home health speech therapy services to address memory if difficulties persist or worsen upon return home.     SLP Assessment  Patient does not need any further [place] Services  Follow Up Recommendations  None    Frequency and Duration        Pertinent Vitals/Pain n/a   SLP Goals     SLP Evaluation Prior Functioning  Cognitive/Linguistic Baseline: Within functional limits Type of Home: House  Lives With: Spouse   Cognition  Overall Cognitive Status:  Impaired/Different from baseline Arousal/Alertness: Awake/alert Orientation Level: Oriented to person;Oriented to place;Oriented to situation;Disoriented to time Attention: Selective Selective Attention: Appears intact Memory: Impaired Memory Impairment: Decreased recall of new information;Decreased short term memory Decreased Short Term Memory: Verbal complex Awareness: Appears intact Problem Solving: Appears intact Executive Function: Sequencing;Organizing Sequencing: Appears intact Organizing: Appears intact Safety/Judgment: Appears intact    Comprehension  Auditory Comprehension Overall Auditory Comprehension: Appears within functional limits for tasks assessed Yes/No Questions: Within Functional Limits Commands: Within Functional Limits Conversation: Complex Reading Comprehension Reading Status: Within funtional limits    Expression Expression Primary Mode of Expression: Verbal Verbal Expression Overall Verbal Expression: Appears within functional limits for tasks assessed Initiation: No impairment Level of Generative/Spontaneous Verbalization: Conversation Repetition: No impairment Naming: No impairment Pragmatics: No impairment Non-Verbal Means of Communication: Not applicable   Oral / Motor Oral Motor/Sensory Function Overall Oral Motor/Sensory Function: Appears within functional limits for tasks assessed Labial ROM: Within Functional Limits Labial Symmetry: Within Functional Limits Labial Strength: Within Functional Limits Lingual ROM: Within Functional Limits Lingual Symmetry: Within Functional Limits Lingual Strength: Within Functional Limits Mandible: Within Functional Limits Motor Speech Overall Motor Speech: Appears within functional limits for tasks assessed Phonation:  (speaking with glottal fry- reports he always speaks like thi)   GO     Gilmore Laroche, [place] Kenley K, MA, CCC-SLP 04/01/2014, 2:41 PM

## 2014-04-02 ENCOUNTER — Other Ambulatory Visit: Payer: Self-pay | Admitting: Internal Medicine

## 2014-04-02 ENCOUNTER — Telehealth: Payer: Self-pay | Admitting: Internal Medicine

## 2014-04-02 LAB — BASIC METABOLIC PANEL
Anion gap: 14 (ref 5–15)
BUN: 24 mg/dL — ABNORMAL HIGH (ref 6–23)
CO2: 20 mEq/L (ref 19–32)
Calcium: 8.6 mg/dL (ref 8.4–10.5)
Chloride: 105 mEq/L (ref 96–112)
Creatinine, Ser: 1.29 mg/dL (ref 0.50–1.35)
GFR calc Af Amer: 60 mL/min — ABNORMAL LOW (ref >90)
GFR calc non Af Amer: 52 mL/min — ABNORMAL LOW (ref >90)
Glucose, Bld: 131 mg/dL — ABNORMAL HIGH (ref 70–99)
Potassium: 4.4 mEq/L (ref 3.7–5.3)
Sodium: 139 mEq/L (ref 137–147)

## 2014-04-02 LAB — CBC
HCT: 37.1 % — ABNORMAL LOW (ref 39.0–52.0)
Hemoglobin: 12.2 g/dL — ABNORMAL LOW (ref 13.0–17.0)
MCH: 28.8 pg (ref 26.0–34.0)
MCHC: 32.9 g/dL (ref 30.0–36.0)
MCV: 87.5 fL (ref 78.0–100.0)
Platelets: 110 10*3/uL — ABNORMAL LOW (ref 150–400)
RBC: 4.24 MIL/uL (ref 4.22–5.81)
RDW: 14 % (ref 11.5–15.5)
WBC: 7.5 10*3/uL (ref 4.0–10.5)

## 2014-04-02 LAB — GLUCOSE, CAPILLARY
Glucose-Capillary: 117 mg/dL — ABNORMAL HIGH (ref 70–99)
Glucose-Capillary: 114 mg/dL — ABNORMAL HIGH (ref 70–99)
Glucose-Capillary: 173 mg/dL — ABNORMAL HIGH (ref 70–99)

## 2014-04-02 LAB — LIPASE, BLOOD
Lipase: 429 U/L — ABNORMAL HIGH (ref 11–59)
Lipase: 257 U/L — ABNORMAL HIGH (ref 11–59)

## 2014-04-02 MED ORDER — APIXABAN 2.5 MG PO TABS
2.5000 mg | ORAL_TABLET | Freq: Two times a day (BID) | ORAL | Status: DC
  Filled 2014-04-02: qty 1

## 2014-04-02 MED ORDER — APIXABAN 2.5 MG PO TABS: 2.5000 mg | ORAL_TABLET | Freq: Two times a day (BID) | ORAL | Status: DC

## 2014-04-02 MED ORDER — ASPIRIN 81 MG PO CHEW: 81.0000 mg | CHEWABLE_TABLET | Freq: Every day | ORAL | Status: DC

## 2014-04-02 MED ORDER — ASPIRIN 81 MG PO CHEW
81.0000 mg | CHEWABLE_TABLET | Freq: Every day | ORAL | Status: DC
  Administered 2014-04-02: 81 mg via ORAL
  Filled 2014-04-02: qty 1

## 2014-04-02 MED ORDER — APIXABAN 5 MG PO TABS
5.0000 mg | ORAL_TABLET | Freq: Two times a day (BID) | ORAL | Status: DC
  Administered 2014-04-02: 5 mg via ORAL
  Filled 2014-04-02 (×2): qty 1

## 2014-04-02 NOTE — Evaluation (Signed)
Occupational Therapy Evaluation Patient Details Name: Adrian Singh MRN: [id number] DOB: August 31, 1934 Today's Date: 04/02/2014    History of Present Illness Adm 03/31/14 with Lt face and hand numbness, with clumsiness of Lt hand. MRI +multiple ischemic infarcts of Rt frontal, temporal, and parietal lobes MRA possible stenosis of bil vertebral arteries (suboptimal quality of exam) PMHx-afib (not on anticoagulant for unknown reason), DM, CKD, CVA   Clinical Impression   Pt is pleasant and cooperative.  He demonstrates slight decreased dynamic balance and decreased LUE FM deficits.  Overall supervision level for selfcare tasks and functional selfcare related transfers.  His wife can provide initial supervision.  Have educated pt on FM coordination tasks and safety.  He is scheduling for outpatient PT for vestibular treatment and balance.  Do not feel he needs any further formal OT for follow-up.    Follow Up Recommendations  No OT follow up    Equipment Recommendations  None recommended by OT       Precautions / Restrictions Precautions Precautions: Fall Restrictions Weight Bearing Restrictions: No      Mobility Bed Mobility Overal bed mobility: Modified Independent                Transfers Overall transfer level: Needs assistance Equipment used: None Transfers: Sit to/from Stand;Stand Pivot Transfers Sit to Stand: Supervision Stand pivot transfers: Supervision       General transfer comment: Safe technique today.      Balance Overall balance assessment: Needs assistance Sitting-balance support: No upper extremity supported Sitting balance-Leahy Scale: Good     Standing balance support: No upper extremity supported Standing balance-Leahy Scale: Good Standing balance comment: Pt able to retireve items from the floor without LOB.  Demonstrates stepping strategy posterior but slightly delayed.  increased swaying noted with feet together and eyes closed.               High level balance activites: Direction changes;Turns;Sudden stops;Head turns;Side stepping High Level Balance Comments: very guarded with movements and requires cues for safe use of RW            ADL Overall ADL's : Needs assistance/impaired Eating/Feeding: Independent   Grooming: Wash/dry hands;Supervision/safety;Standing   Upper Body Bathing: Modified independent;Sitting   Lower Body Bathing: Supervison/ safety   Upper Body Dressing : Set up   Lower Body Dressing: Set up;Sit to/from stand   Toilet Transfer: Supervision/safety;Ambulation;Regular Toilet   Toileting- Water quality scientist and Hygiene: Supervision/safety;Sit to/from stand   Tub/ Shower Transfer: Supervision/safety;Ambulation;Shower seat;Walk-in shower   Functional mobility during ADLs: Supervision/safety General ADL Comments: Pt overall supervision level for simulated selfcare tasks.  Recommend use of a plastic seat in the shower for safety as pt does exhibit some slight dynamic balance deficits.  Educated him on FM coordination exercises and provided handout as reference.       Vision Eye Alignment: Within Functional Limits Alignment/Gaze Preference: Within Defined Limits Ocular Range of Motion: Other (comment) (slight limitations in superior AROM bilaterally) Tracking/Visual Pursuits: Able to track stimulus in all quads without difficulty;Other (comment) (slight jerkiness with tracking horizontally) Saccades: Within functional limits Convergence: Within functional limits         Perception Perception Perception Tested?: No   Praxis Praxis Praxis tested?: Within functional limits    Pertinent Vitals/Pain No report of pain     Hand Dominance Right   Extremity/Trunk Assessment Upper Extremity Assessment Upper Extremity Assessment: LUE deficits/detail LUE Deficits / Details: Pt with minimal LUE weakness and decreased FM coodination.  Slight  difficulty with fastening buttons and tying his  gown.    Lower Extremity Assessment Lower Extremity Assessment: Defer to PT evaluation   Cervical / Trunk Assessment Cervical / Trunk Assessment: Kyphotic   Communication Communication Communication: No difficulties   Cognition Arousal/Alertness: Awake/alert Behavior During Therapy: WFL for tasks assessed/performed Overall Cognitive Status: Within Functional Limits for tasks assessed                        Exercises Exercises: Other exercises Other Exercises Other Exercises: Initiated x1 exercises with pt.  Pt able to perform x1 exercises in sitting up and down and side to side for up to 30 seconds.  Demonstrates good technique and handouts given.       Home Living Family/patient expects to be discharged to:: Private residence Living Arrangements: Spouse/significant other Available Help at Discharge: Family;Friend(s);Available 24 hours/day Type of Home: House Home Access: Stairs to enter [employer] of Steps: 2 Entrance Stairs-Rails: None Home Layout: One level     Bathroom Shower/Tub: Occupational psychologist: Standard Bathroom Accessibility: Yes   Home Equipment: Environmental consultant - 2 wheels;Kasandra Knudsen - single point      Lives With: Spouse    Prior Functioning/Environment Level of Independence: Independent        Comments: no balance problems PTA                              End of Session Equipment Utilized During Treatment: Gait belt Nurse Communication: Mobility status  Activity Tolerance: Patient tolerated treatment well Patient left: in chair;with call bell/phone within reach   Time: 1040-1137 OT Time Calculation (min): 57 min Charges:  OT General Charges $OT Visit: 1 Procedure OT Evaluation $Initial OT Evaluation Tier I: 1 Procedure OT Treatments $Self Care/Home Management : 38-52 mins Ahlana Slaydon OTR/L 04/02/2014, 12:39 PM

## 2014-04-02 NOTE — Discharge Instructions (Signed)
STROKE/TIA DISCHARGE INSTRUCTIONS SMOKING Cigarette smoking nearly doubles your risk of having a stroke & is the single most alterable risk factor  If you smoke or have smoked in the last 12 months, you are advised to quit smoking for your health.  Most of the excess cardiovascular risk related to smoking disappears within a year of stopping.  Ask you doctor about anti-smoking medications  [place] Quit Line: 1-800-QUIT NOW  Free Smoking Cessation Classes [phone]  CHOLESTEROL Know your levels; limit fat & cholesterol in your diet  Lipid Panel     Component Value Date/Time   CHOL 100 04/01/2014 0556   TRIG 123 04/01/2014 0556   HDL 28* 04/01/2014 0556   CHOLHDL 3.6 04/01/2014 0556   VLDL 25 04/01/2014 0556   LDLCALC 47 04/01/2014 0556      Many patients benefit from treatment even if their cholesterol is at goal.  Goal: Total Cholesterol (CHOL) less than 160  Goal:  Triglycerides (TRIG) less than 150  Goal:  HDL greater than 40  Goal:  LDL (LDLCALC) less than 100   BLOOD PRESSURE American Stroke Association blood pressure target is less that 120/80 mm/Hg  Your discharge blood pressure is:  BP: 113/69 mmHg  Monitor your blood pressure  Limit your salt and alcohol intake  Many individuals will require more than one medication for high blood pressure  DIABETES (A1c is a blood sugar average for last 3 months) Goal HGBA1c is under 7% (HBGA1c is blood sugar average for last 3 months)  Diabetes: Diagnosis of diabetes:  Your A1c:6.4 %    Lab Results  Component Value Date   HGBA1C 6.4* 03/31/2014     Your HGBA1c can be lowered with medications, healthy diet, and exercise.  Check your blood sugar as directed by your physician  Call your physician if you experience unexplained or low blood sugars.  PHYSICAL ACTIVITY/REHABILITATION Goal is 30 minutes at least 4 days per week  Activity: No driving, increase activity slowly Therapies: home health Return to work:   Activity  decreases your risk of heart attack and stroke and makes your heart stronger.  It helps control your weight and blood pressure; helps you relax and can improve your mood.  Participate in a regular exercise program.  Talk with your doctor about the best form of exercise for you (dancing, walking, swimming, cycling).  DIET/WEIGHT Goal is to maintain a healthy weight  Your discharge diet is: Carb Control/ Heart healthy regular liquids Your height is:  Height: 5\' 10"  (177.8 cm) Your current weight is: Weight: 95.255 kg (210 lb) Your Body Mass Index (BMI) is:  BMI (Calculated): 30.2  Following the type of diet specifically designed for you will help prevent another stroke.  Your goal weight range is:  133  - 167  Your goal Body Mass Index (BMI) is 19-24.  Healthy food habits can help reduce 3 risk factors for stroke:  High cholesterol, hypertension, and excess weight.  RESOURCES Stroke/Support Group:  Call [phone]   STROKE EDUCATION PROVIDED/REVIEWED AND GIVEN TO PATIENT Stroke warning signs and symptoms How to activate emergency medical system (call 911). Medications prescribed at discharge. Need for follow-up after discharge. Personal risk factors for stroke. Pneumonia vaccine given: No Flu vaccine given: No My questions have been answered, the writing is legible, and I understand these instructions.  I will adhere to these goals & educational materials that have been provided to me after my discharge from the hospital.   Ischemic Stroke A stroke (  cerebrovascular accident) is the sudden death of brain tissue. It is a medical emergency. A stroke can cause permanent loss of brain function. This can cause problems with different parts of your body. A transient ischemic attack (TIA) is different because it does not cause permanent damage. A TIA is a short-lived problem of poor blood flow affecting a part of the brain. A TIA is also a serious problem because having a TIA greatly increases  the chances of having a stroke. When symptoms first develop, you cannot know if the problem might be a stroke or TIA. CAUSES  A stroke is caused by a decrease of oxygen supply to an area of your brain. It is usually the result of a small blood clot or collection of cholesterol or fat (plaque) that blocks blood flow in the brain. A stroke can also be caused by blocked or damaged carotid arteries.  RISK FACTORS  High blood pressure (hypertension).  High cholesterol.  Diabetes mellitus.  Heart disease.  The build up of plaque in the blood vessels (peripheral artery disease or atherosclerosis).  The build up of plaque in the blood vessels providing blood and oxygen to the brain (carotid artery stenosis).  An abnormal heart rhythm (atrial fibrillation).  Obesity.  Smoking.  Taking oral contraceptives (especially in combination with smoking).  Physical inactivity.  A diet high in fats, salt (sodium), and calories.  Alcohol use.  Use of illegal drugs (especially cocaine and methamphetamine).  Being African American.  Being over the age of 60.  Family history of stroke.  Previous history of blood clots, stroke, TIA, or heart attack.  Sickle cell disease. SYMPTOMS  These symptoms usually develop suddenly, or may be newly present upon awakening from sleep:  Sudden weakness or numbness of the face, arm, or leg, especially on one side of the body.  Sudden trouble walking or difficulty moving arms or legs.  Sudden confusion.  Sudden personality changes.  Trouble speaking (aphasia) or understanding.  Difficulty swallowing.  Sudden trouble seeing in one or both eyes.  Double vision.  Dizziness.  Loss of balance or coordination.  Sudden severe headache with no known cause.  Trouble reading or writing. DIAGNOSIS  Your health care provider can often determine the presence or absence of a stroke based on your symptoms, history, and physical exam. Computed tomography  (CT) of the brain is usually performed to confirm the stroke, determine causes, and determine stroke severity. Other tests may be done to find the cause of the stroke. These tests may include:  Electrocardiography.  Continuous heart monitoring.  Echocardiography.  Carotid ultrasonography.  Magnetic resonance imaging (MRI).  A scan of the brain circulation.  Blood tests. PREVENTION  The risk of a stroke can be decreased by appropriately treating high blood pressure, high cholesterol, diabetes, heart disease, and obesity and by quitting smoking, limiting alcohol, and staying physically active. TREATMENT  Time is of the essence. It is important to seek treatment at the first sign of these symptoms because you may receive a medicine to dissolve the clot (thrombolytic) that cannot be given if too much time has passed since your symptoms began. Even if you do not know when your symptoms began, get treatment as soon as possible, as there are other treatment options available including oxygen, intravenous (IV) fluids, and medicines to thin the blood (anticoagulants). Treatment of stroke depends on the duration, severity, and cause of your symptoms. Medicines and diet may be used to address diabetes, high blood pressure, and other  risk factors. Physical, speech, and occupational therapists will assess you and work to improve any functions impaired by the stroke. Measures will be taken to prevent short-term and long-term complications, including infection from breathing foreign material into the lungs (aspiration pneumonia), blood clots in the legs, bedsores, and falls. Rarely, surgery may be needed to remove large blood clots or to open up blocked arteries. HOME CARE INSTRUCTIONS   Take all medicines prescribed by your health care provider. Follow the directions carefully. Medicines may be used to control risk factors for a stroke. Be sure you understand all your medicine instructions.  You may be told  to take a medicine to thin the blood, such as aspirin or the anticoagulant warfarin. Warfarin needs to be taken exactly as instructed.  Too much and too little warfarin are both dangerous. Too much warfarin increases the risk of bleeding. Too little warfarin continues to allow the risk for blood clots. While taking warfarin, you will need to have regular blood tests to measure your blood clotting time. These blood tests usually include both the PT and INR tests. The PT and INR results allow your health care provider to adjust your dose of warfarin. The dose can change for many reasons. It is critically important that you take warfarin exactly as prescribed, and that you have your PT and INR levels drawn exactly as directed.  Many foods, especially foods high in vitamin K can interfere with warfarin and affect the PT and INR results. Foods high in vitamin K include spinach, kale, broccoli, cabbage, collard and turnip greens, brussels sprouts, peas, cauliflower, seaweed, and parsley as well as beef and pork liver, green tea, and soybean oil. You should eat a consistent amount of foods high in vitamin K. Avoid major changes in your diet, or notify your health care provider before changing your diet. Arrange a visit with a dietitian to answer your questions.  Many medicines can interfere with warfarin and affect the PT and INR results. You must tell your health care provider about any and all medicines you take, this includes all vitamins and supplements. Be especially cautious with aspirin and anti-inflammatory medicines. Do not take or discontinue any prescribed or over-the-counter medicine except on the advice of your health care provider or pharmacist.  Warfarin can have side effects, such as excessive bruising or bleeding. You will need to hold pressure over cuts for longer than usual. Your health care provider or pharmacist will discuss other potential side effects.  Avoid sports or activities that may  cause injury or bleeding.  Be mindful when shaving, flossing your teeth, or handling sharp objects.  Alcohol can change the body's ability to handle warfarin. It is best to avoid alcoholic drinks or consume only very small amounts while taking warfarin. Notify your health care provider if you change your alcohol intake.  Notify your dentist or other health care providers before procedures.  If swallow studies have determined that your swallowing reflex is present, you should eat healthy foods. A diet that includes 5 or more servings of fruits and vegetables a day may reduce the risk of stroke. Foods may need to be a special consistency (soft or pureed), or small bites may need to be taken in order to avoid aspirating or choking. Certain diets may be prescribed to address high blood pressure, high cholesterol, diabetes, or obesity.  A low-sodium, low-saturated fat, low-trans fat, low-cholesterol diet is recommended to manage high blood pressure.  A low-saturated fat, low-trans fat, low-cholesterol, and high-fiber  diet may control cholesterol levels.  A controlled-carbohydrate, controlled-sugar diet is recommended to manage diabetes.  A reduced-calorie, low-sodium, low-saturated fat, low-trans fat, low-cholesterol diet is recommended to manage obesity.  Maintain a healthy weight.  Stay physically active. It is recommended that you get at least 30 minutes of activity on most or all days.  Do not use any tobacco products including cigarettes, chewing tobacco, or electronic cigarettes.  Limit alcohol use even if you are not taking warfarin. Moderate alcohol use is considered to be:  No more than 2 drinks each day for men.  No more than 1 drink each day for nonpregnant women..  Home safety. A safe home environment is important to reduce the risk of falls. Your health care provider may arrange for specialists to evaluate your home. Having grab bars in the bedroom and bathroom is often  important. Your health care provider may arrange for equipment to be used at home, such as raised toilets and a seat for the shower.  Physical, occupational, and speech therapy. Ongoing therapy may be needed to maximize your recovery after a stroke. If you have been advised to use a walker or a cane, use it at all times. Be sure to keep your therapy appointments.  Follow all instructions for follow-up with your health care provider. This is very important. This includes any referrals, physical therapy, rehabilitation, and lab tests. Proper follow up can prevent another stroke from occurring. SEEK MEDICAL CARE IF:  You have personality changes.  You have difficulty swallowing.  You are seeing double.  You have dizziness.  You have a fever.  You have skin breakdown. SEEK IMMEDIATE MEDICAL CARE IF:  Any of these symptoms may represent a serious problem that is an emergency. Do not wait to see if the symptoms will go away. Get medical help right away. Call your local emergency services (911 in [place] Do not drive yourself to the hospital.  You have sudden weakness or numbness of the face, arm, or leg, especially on one side of the body.  You have sudden trouble walking or difficulty moving arms or legs.  You have sudden confusion.  You have trouble speaking (aphasia) or understanding.  You have sudden trouble seeing in one or both eyes.  You have a loss of balance or coordination.  You have a sudden, severe headache with no known cause.  You have new chest pain or an irregular heartbeat.  You have a partial or total loss of consciousness.   Document Released: 09/03/2005 Document Revised: 09/08/2013 Document Reviewed: 04/13/2012 [facility] Patient Information 2015 [place], [place]. This information is not intended to replace advice given to you by your health care provider. Make sure you discuss any questions you have with your health care provider.

## 2014-04-02 NOTE — Progress Notes (Signed)
Patient and wife given discharge instructions and prescription; all questions answered.  IV and telemetry discontinued.  Patient escorted via wheelchair by NT to family's vehicle.  Patient discharged home.

## 2014-04-02 NOTE — Progress Notes (Addendum)
Physical Therapy Treatment Patient Details Name: MAHIR PRABHAKAR MRN: [id number] DOB: 19-Sep-1933 Today's Date: 04/02/2014    History of Present Illness Adm 03/31/14 with Lt face and hand numbness, with clumsiness of Lt hand. MRI +multiple ischemic infarcts of Rt frontal, temporal, and parietal lobes MRA possible stenosis of bil vertebral arteries (suboptimal quality of exam) PMHx-afib (not on anticoagulant for unknown reason), DM, CKD, CVA    PT Comments    Pt admitted with above. Pt currently with functional limitations due to balance and endurance deficits.  Wife understands how to assist pt at home.  To have RW and f/u at Outpt to progress exercises.  Discussed exercise progression and gave pt handouts with progression as well.    Pt will benefit from skilled PT to increase their independence and safety with mobility to allow discharge to the venue listed below.    Follow Up Recommendations  Outpatient PT for Vestibular Rehab;Supervision for mobility/OOB     Equipment Recommendations  None recommended by PT    Recommendations for Other Services       Precautions / Restrictions Precautions Precautions: Fall Restrictions Weight Bearing Restrictions: No    Mobility  Bed Mobility Overal bed mobility: Modified Independent                Transfers Overall transfer level: Needs assistance Equipment used: Rolling walker (2 wheeled) Transfers: Sit to/from Stand Sit to Stand: Min guard         General transfer comment: Safe technique today.    Ambulation/Gait Ambulation/Gait assistance: Min assist Ambulation Distance (Feet): 200 Feet Assistive device: Rolling walker (2 wheeled) Gait Pattern/deviations: Step-through pattern;Decreased stride length;Wide base of support   Gait velocity interpretation: Below normal speed for age/gender General Gait Details: Pt continues to look at floor for stabilizing environment.  Able to ambulate with steady gait with RW in controlled  environment.  Using compensatory strategies well for gait.  Wife present and states they have  RW at home.  Showed wife how to adjust height of walker for pt.     Stairs            Wheelchair Mobility    Modified Rankin (Stroke Patients Only) Modified Rankin (Stroke Patients Only) Pre-Morbid Rankin Score: No symptoms Modified Rankin: Moderately severe disability     Balance Overall balance assessment: Needs assistance;History of Falls Sitting-balance support: No upper extremity supported;Feet supported Sitting balance-Leahy Scale: Good     Standing balance support: No upper extremity supported;During functional activity Standing balance-Leahy Scale: Fair Standing balance comment: can stand statically for up to 30 seconds without UE assist.              High level balance activites: Direction changes;Turns;Sudden stops;Head turns;Side stepping High Level Balance Comments: very guarded with movements and requires cues for safe use of RW    Cognition Arousal/Alertness: Awake/alert Behavior During Therapy: WFL for tasks assessed/performed Overall Cognitive Status: Within Functional Limits for tasks assessed                      Exercises Other Exercises Other Exercises: Initiated x1 exercises with pt.  Pt able to perform x1 exercises in sitting up and down and side to side for up to 30 seconds.  Demonstrates good technique and handouts given.      General Comments        Pertinent Vitals/Pain VSS, no pain    Home Living  Prior Function            PT Goals (current goals can now be found in the care plan section) Progress towards PT goals: Progressing toward goals    Frequency  Min 3X/week    PT Plan Current plan remains appropriate    Co-evaluation             End of Session Equipment Utilized During Treatment: Gait belt Activity Tolerance: Patient limited by fatigue Patient left: in chair;with call  bell/phone within reach;with chair alarm set;with family/visitor present     Time: 0211-1552 PT Time Calculation (min): 28 min  Charges:  $Gait Training: 8-22 mins $Therapeutic Exercise: 8-22 mins                    G Codes:      INGOLD,Carlesha Seiple 04/11/2014, 11:49 AM [facility] [phone] (pager)

## 2014-04-02 NOTE — Progress Notes (Signed)
PT Note Pt needs Outpt PT prescription for Vestibular rehab.  Full note of treatment today to follow.  Thanks.  [facility] [phone] (pager)

## 2014-04-02 NOTE — Telephone Encounter (Signed)
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

## 2014-04-02 NOTE — Progress Notes (Addendum)
ANTICOAGULATION CONSULT NOTE - Initial Consult  Pharmacy Consult for Apixaban Indication: atrial fibrillation  No Known Allergies  Patient Measurements: Height: 5\' 10"  (177.8 cm) Weight: 210 lb (95.255 kg) IBW/kg (Calculated) : 73  Vital Signs: Temp: 97.6 F (36.4 C) (07/17 0814) Temp src: Oral (07/17 0400) BP: 141/69 mmHg (07/17 0814) Pulse Rate: 75 (07/17 0814)  Recent Labs  03/31/14 1100 03/31/14 1110 04/02/14 0053  HGB  --  14.1 12.2*  HCT  --  41.6 37.1*  PLT  --  136* 110*  CREATININE  --  1.78* 1.29  TROPONINI <0.30  --   --     Estimated Creatinine Clearance: 55.6 ml/min (by C-G formula based on Cr of 1.29).   Medical History: Past Medical History  Diagnosis Date  . Hypertrophy of prostate without urinary obstruction and other lower urinary tract symptoms (LUTS)   . Type II or unspecified type diabetes mellitus without mention of complication, not stated as uncontrolled   . Esophageal reflux   . Unspecified essential hypertension   . Osteoarthrosis, unspecified whether generalized or localized, unspecified site   . Plantar wart   . Tachycardia, unspecified   . Orthostatic lightheadedness   . Atrial fibrillation     x1  . CAD (coronary artery disease)     nonobstructive  . Chronic kidney disease, stage III (moderate)     Medications:  Prescriptions prior to admission  Medication Sig Dispense Refill  . aspirin 325 MG tablet Take 325 mg by mouth daily.        . Cholecalciferol (VITAMIN D) 2000 UNITS CAPS Take 2,000 Units by mouth daily.       Marland Kitchen glimepiride (AMARYL) 4 MG tablet Take 2 mg by mouth 2 (two) times daily.      . metFORMIN (GLUMETZA) 500 MG (MOD) 24 hr tablet Take 500 mg by mouth 2 (two) times daily.      . metoprolol succinate (TOPROL-XL) 50 MG 24 hr tablet Take 50 mg by mouth daily. Take with or immediately following a meal.      . Multiple Vitamin (MULTIVITAMIN) capsule Take 1 capsule by mouth daily.      . Naproxen Sodium (ALEVE) 220 MG  CAPS Take 220 mg by mouth 2 (two) times daily.       . pantoprazole (PROTONIX) 40 MG tablet Take 40 mg by mouth daily.      . predniSONE (DELTASONE) 20 MG tablet Take 20-40 mg by mouth daily. Starting 03/23/14 40 mg for 5 days, then 20 mg for 5 days      . ramipril (ALTACE) 2.5 MG capsule Take 1 capsule (2.5 mg total) by mouth daily.  90 capsule  3  . tamsulosin (FLOMAX) 0.4 MG CAPS capsule Take 0.4 mg by mouth at bedtime.      . traMADol (ULTRAM) 50 MG tablet Take 1 tablet (50 mg total) by mouth every 8 (eight) hours as needed.  30 tablet  0  . triamterene-hydrochlorothiazide (MAXZIDE-25) 37.5-25 MG per tablet Take 1 tablet by mouth daily.      . vitamin B-12 (CYANOCOBALAMIN) 1000 MCG tablet Take 1,000 mcg by mouth daily.      Marland Kitchen glucose blood test strip Check blood sugar 1-2 times daily and as directed. 250.00  100 each  3  . LANCETS ULTRA THIN MISC Used to test blood sugar 1-2 times daily dx: 250.00  100 each  0    Assessment: 80yo M presenting with weakness and numbness of left arm, admitted for  CVA workup.  Pt being initiated on apixaban for Afib. Pt has been on lovenox for DVT ppx - last dose last night (04/01/2014). CBC and renal fxn okay.  Goal of Therapy:  Therapeutic anticoag for stroke prevention Monitor platelets by anticoagulation protocol: Yes   Plan:  1) Initiate apixaban 5mg  BID 2) D/C lovenox ppx 3) Monitor CBC, renal fxn, s/sx bleeding  Drucie Opitz, PharmD Clinical Pharmacy Resident Pager: [phone] 04/02/2014 11:05 AM  Addendum: PCP prefers lower dose of 2.5mg  BID d/t pt SCr typically ~1.7 mg/dL.  Per package insert, dose reduction requires 2 of the following: -Age >= 58 yo -Wt <= 60 kg -SCr >= 1.5 mg/dL  Per discussion with hospitalist, will continue 2.5mg  BID for now - dose to be dicussed with PCP.  Drucie Opitz, PharmD Clinical Pharmacy Resident Pager: [phone] 04/02/2014 1:37 PM

## 2014-04-02 NOTE — Care Management Note (Signed)
    Page 1 of 1   04/02/2014     1:48:39 PM CARE MANAGEMENT NOTE 04/02/2014  Patient:  Adrian Singh   Account Number:  [id number]  Date Initiated:  04/02/2014  Documentation initiated by:  GRAVES-BIGELOW,Laycie Schriner  Subjective/Objective Assessment:   Pt admitted for Weakness and Numbness Left Arm. Pt will be d/c on Eliquis.     Action/Plan:   CM did call [facility]  and medication  Eliquis is available. Co pay will be 70.00. Pt has 30 day free card- will need Rx for 30 day free no refills. [facility] No further needs.   Anticipated DC Date:  04/02/2014   Anticipated DC Plan:  [place]  CM consult  Medication Assistance      Choice offered to / List presented to:             Status of service:  Completed, signed off Medicare Important Message given?  NA - LOS <3 / Initial given by admissions (If response is "NO", the following Medicare IM given date fields will be blank) Date Medicare IM given:   Medicare IM given by:   Date Additional Medicare IM given:   Additional Medicare IM given by:    Discharge Disposition:  HOME/SELF CARE  Per UR Regulation:  Reviewed for med. necessity/level of care/duration of stay  If discussed at [place] of Stay Meetings, dates discussed:    Comments:

## 2014-04-02 NOTE — Progress Notes (Signed)
Stroke Team Progress Note  HISTORY Adrian Singh is an 80 y.o. male who noted 5 days ago 03/27/2014, time unknown, he had a sudden onset of left hand clumsiness and then decreased sensation. The next day he noted he would become "woozie headed" when standing up. These symptoms did not resolve over the next few days. Today 03/31/2014 he awoke and noted his left face was drawn and had decreased sensation. Due to this reason he was brought to ED. Currently his main complaint is clumsiness in his left hand, decreased sensation in left forearm and hand and transient left facial decreased sensation.  Denies HA, double vision, difficulty swallowing, slurred speech, confusion, language or vision impairment. Patient was not administered TPA secondary to delay in arrival. He was admitted for further evaluation and treatment. The Stroke Team is consulting.  SUBJECTIVE His wife is at the bedside. He reports he needs to go to the bathroom, he cannot wait.   OBJECTIVE Most recent Vital Signs: Filed Vitals:   04/01/14 2030 04/02/14 0001 04/02/14 0400 04/02/14 0814  BP: 144/64 138/66 127/57 141/69  Pulse: 74 78 70 75  Temp: 97.8 F (36.6 C)  98.6 F (37 C) 97.6 F (36.4 C)  TempSrc: Oral  Oral   Resp: 18  18 20   Height:      Weight:      SpO2: 98%  96% 99%   CBG (last 3)   Recent Labs  04/01/14 2036 04/02/14 0448 04/02/14 0739  GLUCAP 211* 117* 114*    IV Fluid Intake:   . sodium chloride 100 mL/hr at 04/02/14 0634    MEDICATIONS  . aspirin  300 mg Rectal Daily   Or  . aspirin  325 mg Oral Daily  . cholecalciferol  2,000 Units Oral Daily  . enoxaparin (LOVENOX) injection  30 mg Subcutaneous Q24H  . insulin aspart  0-9 Units Subcutaneous 6 times per day  . metoprolol succinate  50 mg Oral Daily  . multivitamin with minerals  1 tablet Oral Daily  . pantoprazole  40 mg Oral Daily  . tamsulosin  0.4 mg Oral QHS   PRN:  acetaminophen, LORazepam, ondansetron (ZOFRAN) IV, oxyCODONE,  senna-docusate, zolpidem  Diet:  Carb Control thin liquids Activity:  Bedrest DVT Prophylaxis:  Lovenox 30 mg sq daily   CLINICALLY SIGNIFICANT STUDIES Basic Metabolic Panel:   Recent Labs Lab 03/31/14 1110 04/02/14 0053  NA 135* 139  K 4.8 4.4  CL 99 105  CO2 16* 20  GLUCOSE 103* 131*  BUN 49* 24*  CREATININE 1.78* 1.29  CALCIUM 9.1 8.6   Liver Function Tests:   Recent Labs Lab 03/31/14 1110  AST 28  ALT 28  ALKPHOS 61  BILITOT 0.5  PROT 7.0  ALBUMIN 3.8   CBC:   Recent Labs Lab 03/31/14 1110 04/02/14 0053  WBC 11.7* 7.5  NEUTROABS 8.5*  --   HGB 14.1 12.2*  HCT 41.6 37.1*  MCV 85.6 87.5  PLT 136* 110*   Coagulation: No results found for this basename: LABPROT, INR,  in the last 168 hours Cardiac Enzymes:   Recent Labs Lab 03/31/14 1100  TROPONINI <0.30   Urinalysis:   Recent Labs Lab 03/31/14 1228  COLORURINE YELLOW  LABSPEC 1.020  PHURINE 6.0  GLUCOSEU NEGATIVE  HGBUR NEGATIVE  BILIRUBINUR NEGATIVE  KETONESUR NEGATIVE  PROTEINUR NEGATIVE  UROBILINOGEN 1.0  NITRITE NEGATIVE  LEUKOCYTESUR NEGATIVE   Lipid Panel    Component Value Date/Time   CHOL 100 04/01/2014 0556  TRIG 123 04/01/2014 0556   HDL 28* 04/01/2014 0556   CHOLHDL 3.6 04/01/2014 0556   VLDL 25 04/01/2014 0556   LDLCALC 47 04/01/2014 0556   HgbA1C  Lab Results  Component Value Date   HGBA1C 6.4* 03/31/2014    Urine Drug Screen:   No results found for this basename: labopia,  cocainscrnur,  labbenz,  amphetmu,  thcu,  labbarb    Alcohol Level: No results found for this basename: ETH,  in the last 168 hours   CT of the brain  03/31/2014    No acute intracranial pathology.     MRI of the brain  03/31/2014   1. Multiple focal areas of acute non hemorrhagic infarction involving the right frontal and parietal lobe. These may represent focal embolic infarcts. The watershed event is also considered. 2. Additional 11 mm infarct in the posterior right temporal lobe suggests  the lesions are more likely embolic. 3. No hemorrhage or mass lesion. 4. Minimal atrophy and white matter disease otherwise is within normal limits for age.  MRA Head and Neck  04/01/2014    Suboptimal evaluation of the great vessel origins. Difficult to confirm or exclude proximal LEFT subclavian and/or LEFT common carotid artery disease.  Tortuosity versus BILATERAL ostial stenoses of the vertebral arteries.  Mild non stenotic irregularity at carotid bifurcations.  With regard to the acute RIGHT-sided cerebral infarcts, there is no clear-cut flow reducing or ulcerative lesion within the RIGHT carotid system.     Carotid Doppler  See MRA neck  2D Echocardiogram EF 65-70% with no source of embolus.   CXR  03/31/2014    1. No acute findings. 2. Stable small hiatal hernia.  EKG  normal sinus rhythm. For complete results please see formal report.   Therapy Recommendations OP PT for vestibular rehab, no OT f/u  Physical Exam  Pleasant elderly caucasian male not in distress.Awake alert. Afebrile. Head is nontraumatic. Neck is supple without bruit. Hearing is normal. Cardiac exam no murmur or gallop. Lungs are clear to auscultation. Distal pulses are well felt. Neurological Exam : Awake alert oriented x 3 normal speech and language. Mild left lower face asymmetry. Tongue midline. No drift. Mild diminished fine finger movements on left. Orbits right over left upper extremity. Mild left grip weak.. Normal sensation . Normal coordination.  ASSESSMENT [name] is a 80 y.o. male presenting with left hand clumsiness, decreased sensation and left face "drawn". 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 Infarcts felt to be  embolic secondary to atrial fibrillation.  On aspirin 325 mg orally every day prior to admission. Now on aspirin 325 mg orally every day for secondary stroke prevention. Patient with resultant  Mild left  Face and hand weakness Stroke  work up underway.  atrial fibrillation, not on anticoagulation prior to admission secondary to  Unknown reason hypertension Hx Hyperlipidemia, LDL 47, on no statin PTA, now on statin rx, at goal LDL < 70 for diabetics.  Diabetes, HgbA1c 6.4%, at goal < 7.0 CAD Family hx stroke (father)  GERD CKD, stage III BPH  thrombocytopenia  Hospital day # 2  TREATMENT/PLAN  eliquis for secondary stroke prevention given h/o atrial fibrillation. Discontinue aspirin  OP vestibular PT  Follow up Dr. Erlinda Hong in 2 months.  D/W patient and wife  Stroke team will sign off  Burnetta Sabin, MSN, RN, ANVP-BC, ANP-BC, [facility] Pager: [phone] 04/02/2014 3:04 PM  SIGNED  I  have personally examined this patient, reviewed notes, independently viewed imaging studies, participated in medical decision making and plan of care. I have made any additions or clarifications directly to the above note. Agree with note above.    Antony Contras, MD Medical Director [facility] Pager: [phone] 04/02/2014 5:11 PM  To contact Stroke Continuity provider, please refer to [email or web address] After hours, contact General Neurology

## 2014-04-02 NOTE — Discharge Summary (Addendum)
Physician Discharge Summary  Adrian Singh VZD:[id number] DOB: 07/27/1934 DOA: 03/31/2014  PCP: Viviana Simpler, MD  Admit date: 03/31/2014 Discharge date: 04/02/2014  Time spent: 35 minutes  Recommendations for Outpatient Follow-up:  Oral: 5 mg twice daily : unless patient has any 2 of the following: Age 80 years, body weight <60 kg, or serum creatinine >1.5 mg/dL, then reduce dose to 2.5 mg twice daily- will defer to PCP to increase dose if he sees fit  Cbc, bmp 1 week  Monitor lipase outpatient- elevated but no abd pain  Discharge Diagnoses:  Principal Problem:   CVA (cerebral infarction) Active Problems:   HYPERTENSION   BENIGN PROSTATIC HYPERTROPHY   CAD (coronary artery disease)   Type II or unspecified type diabetes mellitus with neurological manifestations, not stated as uncontrolled(250.60)   Chronic kidney disease, stage III (moderate)   Discharge Condition: improved  Diet recommendation: cardiac/diabetic  Filed Weights   03/31/14 1026  Weight: 95.255 kg (210 lb)    History of present illness:  Adrian Singh is a 80 y.o. male with a history of CAD, HTN, DM2, Hyperlipidemia, and BPH who presents tot he ED with complaints of intermittent weakness and numbness of his left arm over 4 days. He reports that the first episode 4 days ago lasted 30 Minutes, and resolved, but recurred daily and at times he could not control or move his left hand. He reports having associated headache and dizziness and also noticed numbness of the left side of his face and neck. He denies having any chest pain. He was evaluated in the ED and seen by Neurology Dr. Aram Beecham, a CT scan and MRI of the Brain were done and revealed Multiple areas of Non-hemorrhagic infarction of the right frontal and parietal lobes. He was referred for admission to further evaluate his CVA and have further risk stratification   Hospital Course:  CVA (cerebral infarction):  Admitted to complete CVA workup  2D ECHO-  Left ventricle: The cavity size was normal. Wall thickness was increased in a pattern of mild LVH. Systolic function was vigorous. The estimated ejection fraction was in the range of 65% to 70%. Doppler parameters are consistent with abnormal left ventricular relaxation (grade 1 diastolic dysfunction). -eliquis as history of a fib- no a fib on tele here- per Dr. Leonie Man recommendation- spoke directly with him Carotid US ok HbA1C mildly elevated Fasting Lipids ok.  -81 mg asa- will d/c as on eliquis and do not want to increase risk of bleeding -neuro eval  PT- outpatient PT  CAD (coronary artery disease):  Stable  Type II or unspecified type diabetes mellitus with neurological manifestations,  Continue home meds  Chronic kidney disease, stage III (moderate):   HYPERTENSION:  Continue home meds   BENIGN PROSTATIC HYPERTROPHY:  Continue Tamsulosin Rx.    Procedures:  echo  Consultations:  neuro  Discharge Exam: Filed Vitals:   04/02/14 1214  BP: 113/69  Pulse: 71  Temp:   Resp: 18    General: A+Ox3, NAD Cardiovascular: rrr Respiratory: clear  Discharge Instructions You were cared for by a hospitalist during your hospital stay. If you have any questions about your discharge medications or the care you received while you were in the hospital after you are discharged, you can call the unit and asked to speak with the hospitalist on call if the hospitalist that took care of you is not available. Once you are discharged, your primary care physician will handle any further medical issues. Please note  that NO REFILLS for any discharge medications will be authorized once you are discharged, as it is imperative that you return to your primary care physician (or establish a relationship with a primary care physician if you do not have one) for your aftercare needs so that they can reassess your need for medications and monitor your lab values.      Discharge Instructions    Diet - low sodium heart healthy    Complete by:  As directed      Diet Carb Modified    Complete by:  As directed      Discharge instructions    Complete by:  As directed   Cbc, bmp 1 week Vestibular PT walker     Driving Restrictions    Complete by:  As directed   Until cleared by neurology     Increase activity slowly    Complete by:  As directed             Medication List    STOP taking these medications       ALEVE 220 MG Caps  Generic drug:  Naproxen Sodium     aspirin 325 MG tablet     multivitamin capsule     predniSONE 20 MG tablet  Commonly known as:  DELTASONE      TAKE these medications       apixaban 2.5 MG Tabs tablet  Commonly known as:  ELIQUIS  Take 1 tablet (2.5 mg total) by mouth 2 (two) times daily.     glimepiride 4 MG tablet  Commonly known as:  AMARYL  Take 2 mg by mouth 2 (two) times daily.     glucose blood test strip  Check blood sugar 1-2 times daily and as directed. 250.00     LANCETS ULTRA THIN Misc  Used to test blood sugar 1-2 times daily dx: 250.00     metFORMIN 500 MG (MOD) 24 hr tablet  Commonly known as:  GLUMETZA  Take 500 mg by mouth 2 (two) times daily.     metoprolol succinate 50 MG 24 hr tablet  Commonly known as:  TOPROL-XL  Take 50 mg by mouth daily. Take with or immediately following a meal.     pantoprazole 40 MG tablet  Commonly known as:  PROTONIX  Take 40 mg by mouth daily.     ramipril 2.5 MG capsule  Commonly known as:  ALTACE  Take 1 capsule (2.5 mg total) by mouth daily.     tamsulosin 0.4 MG Caps capsule  Commonly known as:  FLOMAX  Take 0.4 mg by mouth at bedtime.     traMADol 50 MG tablet  Commonly known as:  ULTRAM  Take 1 tablet (50 mg total) by mouth every 8 (eight) hours as needed.     triamterene-hydrochlorothiazide 37.5-25 MG per tablet  Commonly known as:  MAXZIDE-25  Take 1 tablet by mouth daily.     vitamin B-12 1000 MCG tablet  Commonly known as:  CYANOCOBALAMIN  Take 1,000  mcg by mouth daily.     Vitamin D 2000 UNITS Caps  Take 2,000 Units by mouth daily.       No Known Allergies Follow-up Information   Follow up with [facility] ([facility])    Specialty:  Neuro-Rehabilitation   Contact information:   [address] [id number] mc [place] [address] [phone]      Follow up with Viviana Simpler, MD In 1 week.  Specialties:  Internal Medicine, Pediatrics   Contact information:   [place] [address] [phone]        The results of significant diagnostics from this hospitalization (including imaging, microbiology, ancillary and laboratory) are listed below for reference.    Significant Diagnostic Studies: Dg Abd 1 View  03/18/2014   CLINICAL DATA:  Flank pain  EXAM: ABDOMEN - 1 VIEW  COMPARISON:  May 21, 2008 CT abdomen  FINDINGS: There is moderate stool in the colon. The bowel gas pattern is unremarkable. No obstruction or free air is seen on this supine examination. Pelvic calcifications most likely represent phleboliths. There is degenerative change in the lumbar spine.  IMPRESSION: Probable phleboliths in the pelvis. Moderate stool in colon. Bowel gas pattern unremarkable.   Electronically Signed   By: Lowella Grip M.D.   On: 03/18/2014 11:56   Ct Head Wo Contrast  03/31/2014   CLINICAL DATA:  Left hand weakness  EXAM: CT HEAD WITHOUT CONTRAST  TECHNIQUE: Contiguous axial images were obtained from the base of the skull through the vertex without intravenous contrast.  COMPARISON:  None.  FINDINGS: There is no evidence of mass effect, midline shift, or extra-axial fluid collections. There is no evidence of a space-occupying lesion or intracranial hemorrhage. There is no evidence of a cortical-based area of acute infarction. There is generalized cerebral atrophy.  The ventricles and sulci are appropriate for the patient's age. The basal  cisterns are patent.  Visualized portions of the orbits are unremarkable. The visualized portions of the paranasal sinuses and mastoid air cells are unremarkable.  The osseous structures are unremarkable.  IMPRESSION: No acute intracranial pathology.   Electronically Signed   By: Kathreen Devoid   On: 03/31/2014 12:14   Mr Jodene Nam Head Wo Contrast  04/01/2014   CLINICAL DATA:  MRI brain demonstrated acute, likely embolic, possibly watershed infarcts in the RIGHT hemisphere. Dizziness, with LEFT sided weakness and numbness.  EXAM: MRA NECK WITHOUT AND WITH CONTRAST  MRA HEAD WITHOUT CONTRAST  TECHNIQUE: Multiplanar and multiecho pulse sequences of the neck were obtained without and with intravenous contrast. Angiographic images of the neck were obtained using MRA technique without and with intravenous contrast.; Angiographic images of the Circle of Willis were obtained using MRA technique without intravenous contrast.  CONTRAST:  11mL MULTIHANCE GADOBENATE DIMEGLUMINE 529 MG/ML IV SOLN  COMPARISON:  None.  FINDINGS: MRA NECK FINDINGS  The study is technically suboptimal, as there is significant contrast with a within the LEFT subclavian and innominate veins, obscuring vessel origins.  The innominate is extremely tortuous. There is no proximal innominate, RIGHT common carotid artery, or RIGHT subclavian stenosis observed.  There is signal dropout at the origins of the LEFT subclavian and LEFT common carotid arteries. These areas are suboptimally evaluated due to venous contamination, as well as tortuosity. I am unable to exclude proximal flow reducing lesions however. If further investigation is desired, CT angiography of the extracranial circulation could provide additional information.  BILATERAL vertebral arteries are tortuous, and poorly visualized in their proximal ostial segments. Flow reducing stenoses of either the RIGHT or LEFT vertebral cannot be excluded. The vertebral arteries in the neck appear widely patent.   There is mild non stenotic irregularity of the RIGHT internal carotid artery at the bifurcation. No flow reducing stenosis. Slight irregularity at the origin of the RIGHT external carotid artery. No dissection or fibromuscular disease.  There is moderate posterior wall plaque in the distal LEFT common carotid  artery which appears non flow reducing. Slight posterior wall plaque extends above the LICA origin along the posterior wall, not flow reducing. No cervical ICA dissection or fibromuscular disease. Unremarkable external carotid.  MRA HEAD FINDINGS  The internal carotid arteries show no areas of stenosis or irregularity. There is moderate disease of the A1 ACA on the RIGHT of doubtful significance, given the robust LEFT A1 ACA.  No focal stenosis of the middle cerebral arteries in the M1 or trifurcation segments, RIGHT or LEFT. There is mild irregularity of the distal MCA and PCA branches suggesting intracranial atherosclerotic change.  The basilar artery is widely patent. The RIGHT vertebral is dominant. There is a 50-75% stenosis of the distal LEFT vertebral.  No proximal PCA disease. No cerebellar branch occlusion. No intracranial aneurysm.  IMPRESSION: Suboptimal evaluation of the great vessel origins. Difficult to confirm or exclude proximal LEFT subclavian and/or LEFT common carotid artery disease.  Tortuosity versus BILATERAL ostial stenoses of the vertebral arteries.  Mild non stenotic irregularity at carotid bifurcations.  With regard to the acute RIGHT-sided cerebral infarcts, there is no clear-cut flow reducing or ulcerative lesion within the RIGHT carotid system.   Electronically Signed   By: Rolla Flatten M.D.   On: 04/01/2014 15:06   Mr Angiogram Neck W Wo Contrast  04/01/2014   CLINICAL DATA:  MRI brain demonstrated acute, likely embolic, possibly watershed infarcts in the RIGHT hemisphere. Dizziness, with LEFT sided weakness and numbness.  EXAM: MRA NECK WITHOUT AND WITH CONTRAST  MRA HEAD  WITHOUT CONTRAST  TECHNIQUE: Multiplanar and multiecho pulse sequences of the neck were obtained without and with intravenous contrast. Angiographic images of the neck were obtained using MRA technique without and with intravenous contrast.; Angiographic images of the Circle of Willis were obtained using MRA technique without intravenous contrast.  CONTRAST:  52mL MULTIHANCE GADOBENATE DIMEGLUMINE 529 MG/ML IV SOLN  COMPARISON:  None.  FINDINGS: MRA NECK FINDINGS  The study is technically suboptimal, as there is significant contrast with a within the LEFT subclavian and innominate veins, obscuring vessel origins.  The innominate is extremely tortuous. There is no proximal innominate, RIGHT common carotid artery, or RIGHT subclavian stenosis observed.  There is signal dropout at the origins of the LEFT subclavian and LEFT common carotid arteries. These areas are suboptimally evaluated due to venous contamination, as well as tortuosity. I am unable to exclude proximal flow reducing lesions however. If further investigation is desired, CT angiography of the extracranial circulation could provide additional information.  BILATERAL vertebral arteries are tortuous, and poorly visualized in their proximal ostial segments. Flow reducing stenoses of either the RIGHT or LEFT vertebral cannot be excluded. The vertebral arteries in the neck appear widely patent.  There is mild non stenotic irregularity of the RIGHT internal carotid artery at the bifurcation. No flow reducing stenosis. Slight irregularity at the origin of the RIGHT external carotid artery. No dissection or fibromuscular disease.  There is moderate posterior wall plaque in the distal LEFT common carotid artery which appears non flow reducing. Slight posterior wall plaque extends above the LICA origin along the posterior wall, not flow reducing. No cervical ICA dissection or fibromuscular disease. Unremarkable external carotid.  MRA HEAD FINDINGS  The internal  carotid arteries show no areas of stenosis or irregularity. There is moderate disease of the A1 ACA on the RIGHT of doubtful significance, given the robust LEFT A1 ACA.  No focal stenosis of the middle cerebral arteries in the M1 or trifurcation segments, RIGHT or  LEFT. There is mild irregularity of the distal MCA and PCA branches suggesting intracranial atherosclerotic change.  The basilar artery is widely patent. The RIGHT vertebral is dominant. There is a 50-75% stenosis of the distal LEFT vertebral.  No proximal PCA disease. No cerebellar branch occlusion. No intracranial aneurysm.  IMPRESSION: Suboptimal evaluation of the great vessel origins. Difficult to confirm or exclude proximal LEFT subclavian and/or LEFT common carotid artery disease.  Tortuosity versus BILATERAL ostial stenoses of the vertebral arteries.  Mild non stenotic irregularity at carotid bifurcations.  With regard to the acute RIGHT-sided cerebral infarcts, there is no clear-cut flow reducing or ulcerative lesion within the RIGHT carotid system.   Electronically Signed   By: Rolla Flatten M.D.   On: 04/01/2014 15:06   Mr Brain Wo Contrast  03/31/2014   CLINICAL DATA:  Left hand clumsiness. Decreased sensation in the left forearm. New onset of dizziness. Transient left facial decreased sensation.  EXAM: MRI HEAD WITHOUT CONTRAST  TECHNIQUE: Multiplanar, multiecho pulse sequences of the brain and surrounding structures were obtained without intravenous contrast.  COMPARISON:  CT head from the same day.  FINDINGS: The diffusion-weighted images demonstrate multiple foci of restricted diffusion within the right frontal and parietal lobe. These lateral scratch the these follow a watershed distribution. There is an additional lesion more posteriorly in the posterior right temporal lobe measuring 11 mm. No left-sided infarcts are present.  No hemorrhage or mass lesion is present. The study is moderately degraded by patient motion. T2 changes are  associated with the areas of acute infarction. Minimal atrophy and white matter disease is otherwise within normal limits for age.  Flow is present in the major intracranial arteries. The globes and orbits are intact. The paranasal sinuses are clear. There is some fluid in the left mastoid air cells. No obstructing nasopharyngeal lesion is evident.  IMPRESSION: 1. Multiple focal areas of acute non hemorrhagic infarction involving the right frontal and parietal lobe. These may represent focal embolic infarcts. The watershed event is also considered. 2. Additional 11 mm infarct in the posterior right temporal lobe suggests the lesions are more likely embolic. 3. No hemorrhage or mass lesion. 4. Minimal atrophy and white matter disease otherwise is within normal limits for age. These results were called by telephone at the time of interpretation on 03/31/2014 at 7:38 pm to Dr. Alexis Goodell , who verbally acknowledged these results.   Electronically Signed   By: Lawrence Santiago M.D.   On: 03/31/2014 19:38   Dg Chest Port 1 View  03/31/2014   CLINICAL DATA:  Dizziness and fatigue  EXAM: PORTABLE CHEST - 1 VIEW  COMPARISON:  09/02/2012  FINDINGS: Retrocardiac density compatible with hiatal hernia, stable.  Cardiac and mediastinal margins appear normal. Thoracic spondylosis noted. The lungs appear clear.  IMPRESSION: 1. No acute findings. 2. Stable small hiatal hernia.   Electronically Signed   By: Sherryl Barters M.D.   On: 03/31/2014 11:40    Microbiology: No results found for this or any previous visit (from the past 240 hour(s)).   Labs: Basic Metabolic Panel:  Recent Labs Lab 03/31/14 1110 04/02/14 0053  NA 135* 139  K 4.8 4.4  CL 99 105  CO2 16* 20  GLUCOSE 103* 131*  BUN 49* 24*  CREATININE 1.78* 1.29  CALCIUM 9.1 8.6   Liver Function Tests:  Recent Labs Lab 03/31/14 1110  AST 28  ALT 28  ALKPHOS 61  BILITOT 0.5  PROT 7.0  ALBUMIN 3.8  Recent Labs Lab 03/31/14 1110  04/02/14 0053 04/02/14 0305  LIPASE 280* 429* 257*   No results found for this basename: AMMONIA,  in the last 168 hours CBC:  Recent Labs Lab 03/31/14 1110 04/02/14 0053  WBC 11.7* 7.5  NEUTROABS 8.5*  --   HGB 14.1 12.2*  HCT 41.6 37.1*  MCV 85.6 87.5  PLT 136* 110*   Cardiac Enzymes:  Recent Labs Lab 03/31/14 1100  TROPONINI <0.30   BNP: BNP (last 3 results)  Recent Labs  03/31/14 1100  PROBNP 179.5   CBG:  Recent Labs Lab 04/01/14 1642 04/01/14 2036 04/02/14 0448 04/02/14 0739 04/02/14 1137  GLUCAP 197* 211* 117* 114* 173*       Signed:  Yulanda Diggs  [facility] 04/02/2014, 2:34 PM

## 2014-04-02 NOTE — Progress Notes (Signed)
Adrian Singh was contacted at home by RN to further review stroke education including BMI, risk factors, signs of stroke, and emergency services.  Patient expressed gratitude for the follow up phone call and verbalized understanding.

## 2014-04-05 ENCOUNTER — Other Ambulatory Visit: Payer: Self-pay | Admitting: Internal Medicine

## 2014-04-12 ENCOUNTER — Ambulatory Visit (INDEPENDENT_AMBULATORY_CARE_PROVIDER_SITE_OTHER): Payer: Medicare Other | Admitting: Internal Medicine

## 2014-04-12 ENCOUNTER — Encounter: Payer: Self-pay | Admitting: Internal Medicine

## 2014-04-12 VITALS — BP 100/60 | HR 80 | Temp 98.1°F | Wt 210.0 lb

## 2014-04-12 DIAGNOSIS — I4891 Unspecified atrial fibrillation: Secondary | ICD-10-CM

## 2014-04-12 DIAGNOSIS — I6340 Cerebral infarction due to embolism of unspecified cerebral artery: Secondary | ICD-10-CM

## 2014-04-12 DIAGNOSIS — I480 Paroxysmal atrial fibrillation: Secondary | ICD-10-CM

## 2014-04-12 DIAGNOSIS — R42 Dizziness and giddiness: Secondary | ICD-10-CM

## 2014-04-12 NOTE — Assessment & Plan Note (Signed)
Apparently due to paroxysmal atrial fib Now on apixaban indefinitely

## 2014-04-12 NOTE — Assessment & Plan Note (Signed)
Regular again Continue the metoprolol and now on eliquis

## 2014-04-12 NOTE — Patient Instructions (Signed)
Please stop the triamterene/HCTZ.

## 2014-04-12 NOTE — Progress Notes (Signed)
Pre visit review using our clinic review tool, if applicable. No additional management support is needed unless otherwise documented below in the visit note. 

## 2014-04-12 NOTE — Assessment & Plan Note (Signed)
More prominent now Will stop the diuretic

## 2014-04-12 NOTE — Progress Notes (Signed)
Subjective:    Patient ID: Adrian Singh, male    DOB: 12-01-1933, 80 y.o.   MRN: [id number]  HPI Here with wife He had heat feeling then numbness in left hand Happened intermittently Then had facial numbness on left and also to left leg ?slight trouble speaking at the very beginning also--very brief  Still with slight numbness in left hand/thumb No limitations  No new symptoms since discharge No speech or swallowing problems. No focal weakness  Occasionally feels his heart irregular Does speed up at times--not as much lately No chest pain--other than soreness at medial left clavicle Occasionally dizzy upon standing--very brief. This is better from when in hospital No edema  Current Outpatient Prescriptions on File Prior to Visit  Medication Sig Dispense Refill  . apixaban (ELIQUIS) 2.5 MG TABS tablet Take 1 tablet (2.5 mg total) by mouth 2 (two) times daily.  60 tablet  0  . BAYER CONTOUR NEXT TEST test strip CHECK BLOOD SUGAR 1-2 TIMES DAILY AND AS INSTRUCTED. DX CODE 250.00.  100 each  2  . Cholecalciferol (VITAMIN D) 2000 UNITS CAPS Take 2,000 Units by mouth daily.       Marland Kitchen glimepiride (AMARYL) 4 MG tablet Take 2 mg by mouth 2 (two) times daily.      Marland Kitchen LANCETS ULTRA THIN MISC Used to test blood sugar 1-2 times daily dx: 250.00  100 each  0  . metFORMIN (GLUMETZA) 500 MG (MOD) 24 hr tablet Take 500 mg by mouth 2 (two) times daily.      . metoprolol succinate (TOPROL-XL) 50 MG 24 hr tablet Take 50 mg by mouth daily. Take with or immediately following a meal.      . pantoprazole (PROTONIX) 40 MG tablet Take 40 mg by mouth daily.      . ramipril (ALTACE) 2.5 MG capsule Take 1 capsule (2.5 mg total) by mouth daily.  90 capsule  3  . tamsulosin (FLOMAX) 0.4 MG CAPS capsule TAKE ONE CAPSULE BY MOUTH DAILY  90 capsule  1  . traMADol (ULTRAM) 50 MG tablet Take 1 tablet (50 mg total) by mouth every 8 (eight) hours as needed.  30 tablet  0  . triamterene-hydrochlorothiazide  (MAXZIDE-25) 37.5-25 MG per tablet Take 1 tablet by mouth daily.      . vitamin B-12 (CYANOCOBALAMIN) 1000 MCG tablet Take 1,000 mcg by mouth daily.       No current facility-administered medications on file prior to visit.    No Known Allergies  Past Medical History  Diagnosis Date  . Hypertrophy of prostate without urinary obstruction and other lower urinary tract symptoms (LUTS)   . Type II or unspecified type diabetes mellitus without mention of complication, not stated as uncontrolled   . Esophageal reflux   . Unspecified essential hypertension   . Osteoarthrosis, unspecified whether generalized or localized, unspecified site   . Plantar wart   . Tachycardia, unspecified   . Orthostatic lightheadedness   . Atrial fibrillation     x1  . CAD (coronary artery disease)     nonobstructive  . Chronic kidney disease, stage III (moderate)     Past Surgical History  Procedure Laterality Date  . Esophagogastroduodenoscopy  2001    with dilation  . Back surgery  2001  . Rotator cuff repair  2005    Left    Family History  Problem Relation Age of Onset  . Stroke Father   . Peripheral vascular disease Father  amputation  . Heart failure Mother     CHF  . Coronary artery disease Mother   . Heart attack Mother     Multiple  . COPD Brother   . Heart disease Brother   . Cancer Brother     ?  . Lung cancer Sister     History   Social History  . Marital Status: Married    Spouse Name: N/A    Number of Children: 4  . Years of Education: N/A   Occupational History  . 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 Smoking status: Former Research scientist (life sciences)  . Smokeless tobacco: Never Used     Comment: Quit after just a few years  . Alcohol Use: No  . Drug Use: No  . Sexual Activity: Not on file   Other Topics Concern  . Not on file   Social History Narrative   No living will   Plans wife and then children to make health care decisions for him if unable     Would request at least attempts at resuscitation but no prolonged life support   Doesn't think he would want tube feeds if cognitively unaware   Review of Systems Appetite is good Sleeps well    Objective:   Physical Exam  Constitutional: He appears well-developed and well-nourished. No distress.  Neck: Normal range of motion. Neck supple. No thyromegaly present.  Cardiovascular: Normal rate, regular rhythm and normal heart sounds.  Exam reveals no gallop.   No murmur heard. Pulmonary/Chest: Effort normal and breath sounds normal. No respiratory distress. He has no wheezes. He has no rales.  Musculoskeletal: He exhibits no edema and no tenderness.  Lymphadenopathy:    He has no cervical adenopathy.  Neurological: No cranial nerve deficit. He exhibits normal muscle tone. Coordination normal.          Assessment & Plan:

## 2014-04-14 ENCOUNTER — Telehealth: Payer: Self-pay | Admitting: Cardiology

## 2014-04-14 NOTE — Telephone Encounter (Signed)
Returned call to patient no answer.LMTC. 

## 2014-04-14 NOTE — Telephone Encounter (Signed)
Spoke to patient he stated he was recently in [place] for weakness,numbness in left arm.Stated he saw PCP today and he wanted him to see Dr.Jordan.Appointment scheduled with Kerin Ransom PA 04/23/14 at 2:30 pm at our [facility] office.

## 2014-04-14 NOTE — Telephone Encounter (Signed)
New Prob   Pt is requesting to speak to a nurse regarding his last hospital stay and possibly seeing Dr. Martinique much sooner after consulting with his PCP. Please call.

## 2014-04-23 ENCOUNTER — Encounter: Payer: Self-pay | Admitting: Cardiology

## 2014-04-23 ENCOUNTER — Ambulatory Visit (INDEPENDENT_AMBULATORY_CARE_PROVIDER_SITE_OTHER): Payer: Medicare Other | Admitting: Cardiology

## 2014-04-23 VITALS — BP 142/74 | HR 77 | Ht 70.0 in | Wt 211.6 lb

## 2014-04-23 DIAGNOSIS — E1129 Type 2 diabetes mellitus with other diabetic kidney complication: Secondary | ICD-10-CM

## 2014-04-23 DIAGNOSIS — N058 Unspecified nephritic syndrome with other morphologic changes: Secondary | ICD-10-CM

## 2014-04-23 DIAGNOSIS — I4891 Unspecified atrial fibrillation: Secondary | ICD-10-CM

## 2014-04-23 DIAGNOSIS — I6340 Cerebral infarction due to embolism of unspecified cerebral artery: Secondary | ICD-10-CM

## 2014-04-23 DIAGNOSIS — E1121 Type 2 diabetes mellitus with diabetic nephropathy: Secondary | ICD-10-CM

## 2014-04-23 DIAGNOSIS — Z7901 Long term (current) use of anticoagulants: Secondary | ICD-10-CM

## 2014-04-23 DIAGNOSIS — I480 Paroxysmal atrial fibrillation: Secondary | ICD-10-CM

## 2014-04-23 NOTE — Assessment & Plan Note (Signed)
On Eliquis 2.5 mg BID since CVA

## 2014-04-23 NOTE — Assessment & Plan Note (Signed)
Neuropathy

## 2014-04-23 NOTE — Assessment & Plan Note (Signed)
SCr 1.2-1.5

## 2014-04-23 NOTE — Progress Notes (Signed)
04/23/2014 Adrian Singh   03-21-1934  [id number]  Primary Physicia Viviana Simpler, MD Primary Cardiologist: Dr Martinique  HPI:  80 y/o male followed by Dr Martinique with a history of non obstructive CAD by cath in 2009, HTN with orthostatic hypotensive symptoms at times, DM. And a remote history of PAF. The pt was recently admitted with embolic CVA. 2D echo 7/16 was WNL, CA dopplers were without significant stenosis, and MRI revealed multiple focal areas of acute non hemorrhagic infarction involving the right frontal and parietal lobe. Fortunately he has not suffered any severe deficits. He was placed on Eliquis for presumed embolic CVA from PAF, though he has not had documented PAF in years.     Current Outpatient Prescriptions  Medication Sig Dispense Refill  . apixaban (ELIQUIS) 2.5 MG TABS tablet Take 1 tablet (2.5 mg total) by mouth 2 (two) times daily.  60 tablet  0  . B Complex Vitamins (VITAMIN B COMPLEX) TABS Take 1 tablet by mouth daily.      Marland Kitchen BAYER CONTOUR NEXT TEST test strip CHECK BLOOD SUGAR 1-2 TIMES DAILY AND AS INSTRUCTED. DX CODE 250.00.  100 each  2  . Cholecalciferol (VITAMIN D) 2000 UNITS CAPS Take 2,000 Units by mouth daily.       Marland Kitchen LANCETS ULTRA THIN MISC Used to test blood sugar 1-2 times daily dx: 250.00  100 each  0  . metFORMIN (GLUMETZA) 500 MG (MOD) 24 hr tablet Take 500 mg by mouth 2 (two) times daily.      . metoprolol succinate (TOPROL-XL) 50 MG 24 hr tablet Take 50 mg by mouth daily. Take with or immediately following a meal.      . pantoprazole (PROTONIX) 40 MG tablet Take 40 mg by mouth daily.      . ramipril (ALTACE) 2.5 MG capsule Take 1 capsule (2.5 mg total) by mouth daily.  90 capsule  3  . tamsulosin (FLOMAX) 0.4 MG CAPS capsule TAKE ONE CAPSULE BY MOUTH DAILY  90 capsule  1  . traMADol (ULTRAM) 50 MG tablet Take 1 tablet (50 mg total) by mouth every 8 (eight) hours as needed.  30 tablet  0  . vitamin B-12 (CYANOCOBALAMIN) 1000 MCG tablet Take 1,000  mcg by mouth daily.       No current facility-administered medications for this visit.    No Known Allergies  History   Social History  . Marital Status: Married    Spouse Name: N/A    Number of Children: 4  . Years of Education: N/A   Occupational History  . 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 Smoking status: Former Research scientist (life sciences)  . Smokeless tobacco: Never Used     Comment: Quit after just a few years  . Alcohol Use: No  . Drug Use: No  . Sexual Activity: Not on file   Other Topics Concern  . Not on file   Social History Narrative   No living will   Plans wife and then children to make health care decisions for him if unable   Would request at least attempts at resuscitation but no prolonged life support   Doesn't think he would want tube feeds if cognitively unaware     Review of Systems: General: negative for chills, fever, night sweats or weight changes.  Cardiovascular: negative for chest pain, dyspnea on exertion, edema, orthopnea, palpitations, paroxysmal nocturnal dyspnea or shortness of breath Dermatological: negative for rash Respiratory:  negative for cough or wheezing Urologic: negative for hematuria Abdominal: negative for nausea, vomiting, diarrhea, bright red blood per rectum, melena, or hematemesis Neurologic: negative for visual changes, syncope, or dizziness All other systems reviewed and are otherwise negative except as noted above.    Blood pressure 142/74, pulse 77, height 5\' 10"  (1.778 m), weight 211 lb 9.6 oz (95.981 kg).  General appearance: alert, cooperative and no distress Lungs: clear to auscultation bilaterally Heart: regular rate and rhythm  EKG NSR  ASSESSMENT AND PLAN:   Cerebral embolism with cerebral infarction 03/31/14 presumably secondary to PAF- good recovery  Chronic anticoagulation On Eliquis 2.5 mg BID since CVA  PAF (paroxysmal atrial fibrillation) Remote PAF by history- no recent documented  PAF  DM2 (diabetes mellitus, type 2) Neuropathy  Chronic kidney disease, stage III (moderate) SCr 1.2-1.5  CAD (coronary artery disease) Non obstructive CAD 2009   PLAN  I spoke with Dr Percival Spanish in the office today. He feels [name] should be on lifelong anticoagulation. He saw no indication for a monitor.   Burch Marchuk KPA-C 04/23/2014 5:07 PM

## 2014-04-23 NOTE — Assessment & Plan Note (Signed)
Remote PAF by history- no recent documented PAF

## 2014-04-23 NOTE — Patient Instructions (Signed)
Eliquis patient assistance [phone]. Please have the following information ready when you call:  - Annual income  - Whether you are in the doughnut hole  - Insurance information  Give [place] a call on Monday to see if we have any samples.  Kerin Ransom, PA-C recommends that you schedule a follow-up appointment in 3 months with Dr Martinique.

## 2014-04-23 NOTE — Assessment & Plan Note (Addendum)
03/31/14 presumably secondary to PAF- good recovery

## 2014-04-23 NOTE — Assessment & Plan Note (Signed)
Non obstructive CAD 2009

## 2014-04-27 ENCOUNTER — Other Ambulatory Visit: Payer: Self-pay | Admitting: *Deleted

## 2014-04-27 MED ORDER — APIXABAN 5 MG PO TABS: 2.5000 mg | ORAL_TABLET | Freq: Two times a day (BID) | ORAL | Status: DC

## 2014-04-27 NOTE — Telephone Encounter (Signed)
Patient walked in requesting eliquis samples. He takes 2.5mg  twice daily. 5 mg tablets were provided with instructions to cut in half and take 1/2 tablet twice daily.

## 2014-05-14 ENCOUNTER — Other Ambulatory Visit: Payer: Self-pay | Admitting: Internal Medicine

## 2014-05-18 ENCOUNTER — Telehealth: Payer: Self-pay

## 2014-05-18 NOTE — Telephone Encounter (Signed)
Patient walked in office complaining of side effects from Eliquis.Stated causing headaches.Spoke to [place] he advised to stop Eliquis and start Xarelto 20 mg daily.Follow up appointment scheduled with Dr.Jordan 06/17/14 at 4:30 pm.

## 2014-06-02 ENCOUNTER — Telehealth: Payer: Self-pay | Admitting: Cardiology

## 2014-06-02 MED ORDER — RIVAROXABAN 20 MG PO TABS: 20.0000 mg | ORAL_TABLET | Freq: Every day | ORAL | Status: DC

## 2014-06-02 NOTE — Telephone Encounter (Signed)
Pt called in stating that he is almost out of Xarelto 20mg  and would like it refilled and called into [facility]. Please call  Thanks

## 2014-06-02 NOTE — Telephone Encounter (Signed)
Med was changed from eliquis to [place] r/t SE. Patient needed refill on xarelto. Rx was sent to pharmacy electronically.

## 2014-06-14 ENCOUNTER — Other Ambulatory Visit: Payer: Self-pay | Admitting: Internal Medicine

## 2014-06-14 ENCOUNTER — Ambulatory Visit (INDEPENDENT_AMBULATORY_CARE_PROVIDER_SITE_OTHER): Payer: Medicare Other | Admitting: Internal Medicine

## 2014-06-14 ENCOUNTER — Encounter: Payer: Self-pay | Admitting: Internal Medicine

## 2014-06-14 VITALS — BP 130/80 | HR 82 | Temp 97.4°F | Ht 70.0 in | Wt 207.5 lb

## 2014-06-14 DIAGNOSIS — N183 Chronic kidney disease, stage 3 unspecified: Secondary | ICD-10-CM

## 2014-06-14 DIAGNOSIS — Z23 Encounter for immunization: Secondary | ICD-10-CM

## 2014-06-14 DIAGNOSIS — I480 Paroxysmal atrial fibrillation: Secondary | ICD-10-CM

## 2014-06-14 DIAGNOSIS — I6340 Cerebral infarction due to embolism of unspecified cerebral artery: Secondary | ICD-10-CM

## 2014-06-14 DIAGNOSIS — E1149 Type 2 diabetes mellitus with other diabetic neurological complication: Secondary | ICD-10-CM

## 2014-06-14 DIAGNOSIS — I4891 Unspecified atrial fibrillation: Secondary | ICD-10-CM

## 2014-06-14 DIAGNOSIS — E1142 Type 2 diabetes mellitus with diabetic polyneuropathy: Secondary | ICD-10-CM

## 2014-06-14 DIAGNOSIS — N400 Enlarged prostate without lower urinary tract symptoms: Secondary | ICD-10-CM

## 2014-06-14 MED ORDER — FINASTERIDE 5 MG PO TABS: 5.0000 mg | ORAL_TABLET | Freq: Every day | ORAL | Status: DC

## 2014-06-14 NOTE — Addendum Note (Signed)
Addended by: Helene Shoe on: 06/14/2014 05:33 PM   Modules accepted: Orders

## 2014-06-14 NOTE — Assessment & Plan Note (Signed)
Increased symptoms Will add finasteride

## 2014-06-14 NOTE — Assessment & Plan Note (Signed)
Mild symptoms No meds

## 2014-06-14 NOTE — Assessment & Plan Note (Signed)
Lab Results  Component Value Date   HGBA1C 6.4* 03/31/2014   Good control Too soon to recheck

## 2014-06-14 NOTE — Assessment & Plan Note (Signed)
Regular now Rate fine On xarelto

## 2014-06-14 NOTE — Assessment & Plan Note (Signed)
No recurrent symptoms No disability from this On xarelto now--permanent

## 2014-06-14 NOTE — Progress Notes (Signed)
Subjective:    Patient ID: Adrian Singh, male    DOB: 1933/10/21, 80 y.o.   MRN: [id number]  HPI Doing okay  Was getting headaches and felt funny across face also Heart rate went fast and irregular also Thought to be from eliquis Now on xarelto Seems to be tolerating this fine  No new stroke like symptoms Notices weakness if he gets out to do work Heart rate seems to go up with exertion also Thinks his heart is regular No dizziness since off the diuretic  Increased urinary frequency--especially at night Flow is slower also Thinks he needs more meds over the past couple of weeks Gets urgency and trouble getting it to start  Checks sugars 3-4 times a week 130-150 generally  No low sugar reactions Overdue for eye exam  Current Outpatient Prescriptions on File Prior to Visit  Medication Sig Dispense Refill  . B Complex Vitamins (VITAMIN B COMPLEX) TABS Take 1 tablet by mouth daily.      Marland Kitchen BAYER CONTOUR NEXT TEST test strip CHECK BLOOD SUGAR 1-2 TIMES DAILY AND AS INSTRUCTED. DX CODE 250.00.  100 each  2  . Cholecalciferol (VITAMIN D) 2000 UNITS CAPS Take 2,000 Units by mouth daily.       Marland Kitchen glimepiride (AMARYL) 4 MG tablet TAKE 1 TABLET WITH BREAKFAST OR FIRST MAIN MEAL OF DAY  90 tablet  0  . LANCETS ULTRA THIN MISC Used to test blood sugar 1-2 times daily dx: 250.00  100 each  0  . metFORMIN (GLUMETZA) 500 MG (MOD) 24 hr tablet Take 500 mg by mouth 2 (two) times daily.      . metoprolol succinate (TOPROL-XL) 50 MG 24 hr tablet Take 50 mg by mouth daily. Take with or immediately following a meal.      . pantoprazole (PROTONIX) 40 MG tablet Take 40 mg by mouth daily.      . ramipril (ALTACE) 2.5 MG capsule Take 1 capsule (2.5 mg total) by mouth daily.  90 capsule  3  . rivaroxaban (XARELTO) 20 MG TABS tablet Take 1 tablet (20 mg total) by mouth daily with supper.  30 tablet  6  . tamsulosin (FLOMAX) 0.4 MG CAPS capsule TAKE ONE CAPSULE BY MOUTH DAILY  90 capsule  1  .  vitamin B-12 (CYANOCOBALAMIN) 1000 MCG tablet Take 1,000 mcg by mouth daily.      . traMADol (ULTRAM) 50 MG tablet Take 1 tablet (50 mg total) by mouth every 8 (eight) hours as needed.  30 tablet  0   No current facility-administered medications on file prior to visit.    No Known Allergies  Past Medical History  Diagnosis Date  . Hypertrophy of prostate without urinary obstruction and other lower urinary tract symptoms (LUTS)   . Type II or unspecified type diabetes mellitus without mention of complication, not stated as uncontrolled   . Esophageal reflux   . Unspecified essential hypertension   . Osteoarthrosis, unspecified whether generalized or localized, unspecified site   . Plantar wart   . Tachycardia, unspecified   . Orthostatic lightheadedness   . Atrial fibrillation     x1  . CAD (coronary artery disease)     nonobstructive  . Chronic kidney disease, stage III (moderate)     Past Surgical History  Procedure Laterality Date  . Esophagogastroduodenoscopy  2001    with dilation  . Back surgery  2001  . Rotator cuff repair  2005    Left  Family History  Problem Relation Age of Onset  . Stroke Father   . Peripheral vascular disease Father     amputation  . Heart failure Mother     CHF  . Coronary artery disease Mother   . Heart attack Mother     Multiple  . COPD Brother   . Heart disease Brother   . Cancer Brother     ?  . Lung cancer Sister     History   Social History  . Marital Status: Married    Spouse Name: N/A    Number of Children: 4  . Years of Education: N/A   Occupational History  . 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 Smoking status: Former Research scientist (life sciences)  . Smokeless tobacco: Never Used     Comment: Quit after just a few years  . Alcohol Use: No  . Drug Use: No  . Sexual Activity: Not on file   Other Topics Concern  . Not on file   Social History Narrative   No living will   Plans wife and then children to  make health care decisions for him if unable   Would request at least attempts at resuscitation but no prolonged life support   Doesn't think he would want tube feeds if cognitively unaware   Review of Systems Sleeps okay --though up frequently to void Appetite is fine Weight down a few pounds---but he thinks he is doing okay    Objective:   Physical Exam  Constitutional: He appears well-developed and well-nourished. No distress.  Neck: Normal range of motion. Neck supple.  Cardiovascular: Normal rate, regular rhythm, normal heart sounds and intact distal pulses.  Exam reveals no gallop.   No murmur heard. Pulmonary/Chest: Effort normal and breath sounds normal. No respiratory distress. He has no wheezes. He has no rales.  Abdominal: Soft. There is no tenderness.  Musculoskeletal: He exhibits no edema and no tenderness.  Lymphadenopathy:    He has no cervical adenopathy.  Skin: No rash noted. No erythema.  Mycotic 1st toenails No foot ulcers or sig callous  Psychiatric: He has a normal mood and affect. His behavior is normal.          Assessment & Plan:

## 2014-06-14 NOTE — Patient Instructions (Signed)
Please set up your diabetic eye exam. 

## 2014-06-14 NOTE — Progress Notes (Signed)
Pre visit review using our clinic review tool, if applicable. No additional management support is needed unless otherwise documented below in the visit note. 

## 2014-06-14 NOTE — Assessment & Plan Note (Signed)
On ACE-I 

## 2014-06-17 ENCOUNTER — Encounter: Payer: Self-pay | Admitting: Cardiology

## 2014-06-17 ENCOUNTER — Ambulatory Visit (INDEPENDENT_AMBULATORY_CARE_PROVIDER_SITE_OTHER): Payer: Medicare Other | Admitting: Cardiology

## 2014-06-17 VITALS — BP 140/68 | HR 82 | Ht 70.0 in | Wt 210.0 lb

## 2014-06-17 DIAGNOSIS — I480 Paroxysmal atrial fibrillation: Secondary | ICD-10-CM

## 2014-06-17 DIAGNOSIS — I10 Essential (primary) hypertension: Secondary | ICD-10-CM

## 2014-06-17 DIAGNOSIS — Z7901 Long term (current) use of anticoagulants: Secondary | ICD-10-CM

## 2014-06-17 DIAGNOSIS — I6340 Cerebral infarction due to embolism of unspecified cerebral artery: Secondary | ICD-10-CM

## 2014-06-17 NOTE — Patient Instructions (Signed)
Continue your current therapy  I will see you in 6 months.   

## 2014-06-17 NOTE — Progress Notes (Signed)
[name] Date of Birth: [birth year]   History of Present Illness: Mr. Buda is seen today for yearly followup. He has a history of nonobstructive coronary disease by cardiac catheterization 2009. He also has a history of orthostatic dizziness. He was admitted in July 7353 with an embolic stroke presumably due to paroxysmal Afib. He was anticoagulated with Eliquis. The patient developed tachycardia and HA on Eliquis that resolved when he was switched to Xarelto. He notes little residual from his CVA. He notes his orthostatic symptoms resolved with stopping his diuretic. He has no edema.   Current Outpatient Prescriptions on File Prior to Visit  Medication Sig Dispense Refill  . B Complex Vitamins (VITAMIN B COMPLEX) TABS Take 1 tablet by mouth daily.      Marland Kitchen BAYER CONTOUR NEXT TEST test strip CHECK BLOOD SUGAR 1-2 TIMES DAILY AND AS INSTRUCTED. DX CODE 250.00.  100 each  2  . Cholecalciferol (VITAMIN D) 2000 UNITS CAPS Take 2,000 Units by mouth daily.       . finasteride (PROSCAR) 5 MG tablet Take 1 tablet (5 mg total) by mouth daily.  30 tablet  11  . glimepiride (AMARYL) 4 MG tablet TAKE 1 TABLET WITH BREAKFAST OR FIRST MAIN MEAL OF DAY  90 tablet  0  . LANCETS ULTRA THIN MISC Used to test blood sugar 1-2 times daily dx: 250.00  100 each  0  . metFORMIN (GLUMETZA) 500 MG (MOD) 24 hr tablet Take 500 mg by mouth 2 (two) times daily.      . metoprolol succinate (TOPROL-XL) 50 MG 24 hr tablet TAKE 1 TABLET EVERY DAY  90 tablet  1  . pantoprazole (PROTONIX) 40 MG tablet Take 40 mg by mouth daily.      . ramipril (ALTACE) 2.5 MG capsule Take 1 capsule (2.5 mg total) by mouth daily.  90 capsule  3  . rivaroxaban (XARELTO) 20 MG TABS tablet Take 1 tablet (20 mg total) by mouth daily with supper.  30 tablet  6  . tamsulosin (FLOMAX) 0.4 MG CAPS capsule TAKE ONE CAPSULE BY MOUTH DAILY  90 capsule  1  . traMADol (ULTRAM) 50 MG tablet Take 1 tablet (50 mg total) by mouth every 8 (eight) hours as  needed.  30 tablet  0  . vitamin B-12 (CYANOCOBALAMIN) 1000 MCG tablet Take 1,000 mcg by mouth daily.       No current facility-administered medications on file prior to visit.    No Known Allergies  Past Medical History  Diagnosis Date  . Hypertrophy of prostate without urinary obstruction and other lower urinary tract symptoms (LUTS)   . Type II or unspecified type diabetes mellitus without mention of complication, not stated as uncontrolled   . Esophageal reflux   . Unspecified essential hypertension   . Osteoarthrosis, unspecified whether generalized or localized, unspecified site   . Plantar wart   . Tachycardia, unspecified   . Orthostatic lightheadedness   . Atrial fibrillation     x1  . CAD (coronary artery disease)     nonobstructive  . Chronic kidney disease, stage III (moderate)   . History of CVA (cerebrovascular accident)     Past Surgical History  Procedure Laterality Date  . Esophagogastroduodenoscopy  2001    with dilation  . Back surgery  2001  . Rotator cuff repair  2005    Left    History  Smoking status  . Former Smoker  Smokeless tobacco  . Never Used  Comment: Quit after just a few years    History  Alcohol Use No    Family History  Problem Relation Age of Onset  . Stroke Father   . Peripheral vascular disease Father     amputation  . Heart failure Mother     CHF  . Coronary artery disease Mother   . Heart attack Mother     Multiple  . COPD Brother   . Heart disease Brother   . Cancer Brother     ?  . Lung cancer Sister     Review of Systems: The review of systems is positive for orthostatic lightheadedness.  All other systems were reviewed and are negative.  Physical Exam: BP 140/68  Pulse 82  Ht 5\' 10"  (1.778 m)  Wt 210 lb (95.255 kg)  BMI 30.13 kg/m2 He is a pleasant white male in no acute distress.  The HEENT exam is normal.  The carotids are 2+ without bruits.  There is no thyromegaly.  There is no JVD.  The lungs  are clear.    The heart exam reveals a regular rate with a normal S1 and S2.  There are no murmurs, gallops, or rubs.  The PMI is not displaced.   Abdominal exam reveals good bowel sounds.    There is no hepatosplenomegaly or tenderness.  There are no masses.  Exam of the legs reveal no clubbing, cyanosis, or edema. The distal pulses are intact.  Cranial nerves II - XII are intact.  Motor and sensory functions are intact.  The gait is normal.  LABORATORY DATA: Echo 04/01/14: Study Conclusions  - Left ventricle: The cavity size was normal. Wall thickness was increased in a pattern of mild LVH. Systolic function was vigorous. The estimated ejection fraction was in the range of 65% to 70%. Doppler parameters are consistent with abnormal left ventricular relaxation (grade 1 diastolic dysfunction).     Assessment / Plan: 1. Orthostatic dizziness. Resolved with stopping diuretic. Continue other therapy.  2. Hypertension, controlled.  3. Nonobstructive coronary disease, asymptomatic.  4. Paroxysmal Afib. Continue metoprolol and Xarelto.  5. History of CVA.

## 2014-07-08 ENCOUNTER — Other Ambulatory Visit: Payer: Self-pay | Admitting: Internal Medicine

## 2014-07-08 NOTE — Telephone Encounter (Signed)
rx called into pharmacy

## 2014-07-08 NOTE — Telephone Encounter (Signed)
03/18/14 

## 2014-07-08 NOTE — Telephone Encounter (Signed)
Okay #[phone] x 0 

## 2014-07-29 ENCOUNTER — Ambulatory Visit: Payer: Medicare Other | Admitting: Cardiology

## 2014-08-16 ENCOUNTER — Other Ambulatory Visit: Payer: Self-pay | Admitting: Internal Medicine

## 2014-08-31 ENCOUNTER — Other Ambulatory Visit: Payer: Self-pay | Admitting: Internal Medicine

## 2014-09-01 ENCOUNTER — Telehealth: Payer: Self-pay | Admitting: Internal Medicine

## 2014-09-01 DIAGNOSIS — L723 Sebaceous cyst: Secondary | ICD-10-CM

## 2014-09-01 NOTE — Telephone Encounter (Signed)
Pt comes in today to request a referral for the knot on his head. He said that Dr Silvio Pate had said that it was alittle too big to remove it himself.

## 2014-09-27 ENCOUNTER — Ambulatory Visit (INDEPENDENT_AMBULATORY_CARE_PROVIDER_SITE_OTHER): Payer: Medicare Other | Admitting: Internal Medicine

## 2014-09-27 ENCOUNTER — Encounter: Payer: Self-pay | Admitting: Internal Medicine

## 2014-09-27 ENCOUNTER — Other Ambulatory Visit: Payer: Self-pay | Admitting: Internal Medicine

## 2014-09-27 VITALS — BP 120/70 | HR 70 | Temp 97.9°F | Ht 70.0 in | Wt 211.0 lb

## 2014-09-27 DIAGNOSIS — I480 Paroxysmal atrial fibrillation: Secondary | ICD-10-CM

## 2014-09-27 DIAGNOSIS — I6340 Cerebral infarction due to embolism of unspecified cerebral artery: Secondary | ICD-10-CM

## 2014-09-27 DIAGNOSIS — Z7189 Other specified counseling: Secondary | ICD-10-CM | POA: Insufficient documentation

## 2014-09-27 DIAGNOSIS — E1140 Type 2 diabetes mellitus with diabetic neuropathy, unspecified: Secondary | ICD-10-CM

## 2014-09-27 DIAGNOSIS — Z0000 Encounter for general adult medical examination without abnormal findings: Secondary | ICD-10-CM

## 2014-09-27 DIAGNOSIS — N400 Enlarged prostate without lower urinary tract symptoms: Secondary | ICD-10-CM

## 2014-09-27 DIAGNOSIS — E1142 Type 2 diabetes mellitus with diabetic polyneuropathy: Secondary | ICD-10-CM

## 2014-09-27 LAB — HEMOGLOBIN A1C: Hgb A1c MFr Bld: 7.9 % — ABNORMAL HIGH (ref 4.6–6.5)

## 2014-09-27 LAB — HM DIABETES FOOT EXAM

## 2014-09-27 MED ORDER — ZOSTER VACCINE LIVE 19400 UNT/0.65ML ~~LOC~~ SOLR: 0.6500 mL | Freq: Once | SUBCUTANEOUS | Status: DC

## 2014-09-27 NOTE — Assessment & Plan Note (Signed)
Voids okay on dual therapy

## 2014-09-27 NOTE — Assessment & Plan Note (Signed)
Regular  On xarelto

## 2014-09-27 NOTE — Progress Notes (Signed)
Pre visit review using our clinic review tool, if applicable. No additional management support is needed unless otherwise documented below in the visit note. 

## 2014-09-27 NOTE — Assessment & Plan Note (Signed)
I have personally reviewed the Medicare Annual Wellness questionnaire and have noted 1. The patient's medical and social history 2. Their use of alcohol, tobacco or illicit drugs 3. Their current medications and supplements 4. The patient's functional ability including ADL's, fall risks, home safety risks and hearing or visual             impairment. 5. Diet and physical activities 6. Evidence for depression or mood disorders  The patients weight, height, BMI and visual acuity have been recorded in the chart I have made referrals, counseling and provided education to the patient based review of the above and I have provided the pt with a written personalized care plan for preventive services.  I have provided you with a copy of your personalized plan for preventive services. Please take the time to review along with your updated medication list.  No cancer screening due to age Rx for zostavax UTD otherwise with preventative care

## 2014-09-27 NOTE — Assessment & Plan Note (Signed)
Seems to have good control Due for A1c

## 2014-09-27 NOTE — Progress Notes (Signed)
Subjective:    Patient ID: Adrian Singh, male    DOB: 12-Jan-1934, 81 y.o.   MRN: [id number]  HPI Here for Medicare wellness and follow up of chronic medical conditions Reviewed form and advanced directives Reviewed other physicians No tobacco or alcohol Does some minor exercising --discussed more aerobic Mild hearing loss--not a big problem. Vision is fine Independent with instrumental ADLs No falls No depression or anhedonia--still enjoys playing guitar No sig cognitive changes  Had been off the diuretic Went to beach for fishing---without the meds Legs swelled quite a bit---got better upon restarting  No chest pain No SOB No palpitations No dizziness or syncope  No abnormal sensations anymore Even feet don't feel as numb---just tingling if they are swollen Still gets tired sooner with exertion---just a general decrease in energy from the stroke  Checks sugars once or twice a week 100-130 No hypoglycemia Overdue for eye exam No sores on feet  Voiding better since starting finasteride Combined with tamsulosin is doing okay Still will have nocturia x 2  Current Outpatient Prescriptions on File Prior to Visit  Medication Sig Dispense Refill  . B Complex Vitamins (VITAMIN B COMPLEX) TABS Take 1 tablet by mouth daily.    Marland Kitchen BAYER CONTOUR NEXT TEST test strip CHECK BLOOD SUGAR 1-2 TIMES DAILY AND AS INSTRUCTED. DX CODE 250.00. 100 each 2  . Cholecalciferol (VITAMIN D) 2000 UNITS CAPS Take 2,000 Units by mouth daily.     . finasteride (PROSCAR) 5 MG tablet Take 1 tablet (5 mg total) by mouth daily. 30 tablet 11  . glimepiride (AMARYL) 4 MG tablet TAKE 1 TABLET WITH BREAKFAST OR FIRST MAIN MEAL OF DAY 90 tablet 1  . LANCETS ULTRA THIN MISC Used to test blood sugar 1-2 times daily dx: 250.00 100 each 0  . metoprolol succinate (TOPROL-XL) 50 MG 24 hr tablet TAKE 1 TABLET EVERY DAY 90 tablet 1  . pantoprazole (PROTONIX) 40 MG tablet Take 40 mg by mouth daily.    . ramipril  (ALTACE) 2.5 MG capsule TAKE 1 CAPSULE (2.5 MG TOTAL) BY MOUTH DAILY. 90 capsule 3  . rivaroxaban (XARELTO) 20 MG TABS tablet Take 1 tablet (20 mg total) by mouth daily with supper. 30 tablet 6  . traMADol (ULTRAM) 50 MG tablet TAKE 1 TABLET BY MOUTH EVERY 8 HOURS AS NEEDED 30 tablet 0  . vitamin B-12 (CYANOCOBALAMIN) 1000 MCG tablet Take 1,000 mcg by mouth daily.     No current facility-administered medications on file prior to visit.    No Known Allergies  Past Medical History  Diagnosis Date  . Hypertrophy of prostate without urinary obstruction and other lower urinary tract symptoms (LUTS)   . Type II or unspecified type diabetes mellitus without mention of complication, not stated as uncontrolled   . Esophageal reflux   . Unspecified essential hypertension   . Osteoarthrosis, unspecified whether generalized or localized, unspecified site   . Plantar wart   . Tachycardia, unspecified   . Orthostatic lightheadedness   . Atrial fibrillation     x1  . CAD (coronary artery disease)     nonobstructive  . Chronic kidney disease, stage III (moderate)   . History of CVA (cerebrovascular accident)     Past Surgical History  Procedure Laterality Date  . Esophagogastroduodenoscopy  2001    with dilation  . Back surgery  2001  . Rotator cuff repair  2005    Left    Family History  Problem Relation Age of  Onset  . Stroke Father   . Peripheral vascular disease Father     amputation  . Heart failure Mother     CHF  . Coronary artery disease Mother   . Heart attack Mother     Multiple  . COPD Brother   . Heart disease Brother   . Cancer Brother     ?  . Lung cancer Sister     History   Social History  . Marital Status: Married    Spouse Name: N/A    Number of Children: 4  . Years of Education: N/A   Occupational History  . 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 Smoking status: Former Research scientist (life sciences)  . Smokeless tobacco: Never Used     Comment:  Quit after just a few years  . Alcohol Use: No  . Drug Use: No  . Sexual Activity: Not on file   Other Topics Concern  . Not on file   Social History Narrative   No living will   Plans wife and then children to make health care decisions for him if unable   Would request at least attempts at resuscitation but no prolonged life support   Doesn't think he would want tube feeds if cognitively unaware   Review of Systems Has cyst on back of head he is scheduled to have excised. Has been having some pain in knees Sleeps well Appetite is good Weight is stable     Objective:   Physical Exam  Constitutional: He is oriented to person, place, and time. He appears well-developed and well-nourished. No distress.  HENT:  Mouth/Throat: Oropharynx is clear and moist. No oropharyngeal exudate.  Full dentures  Neck: Normal range of motion. Neck supple. No thyromegaly present.  Cardiovascular: Normal rate, regular rhythm, normal heart sounds and intact distal pulses.  Exam reveals no gallop.   No murmur heard. Pulmonary/Chest: Effort normal and breath sounds normal. No respiratory distress. He has no wheezes. He has no rales.  Abdominal: Soft. There is no tenderness.  Musculoskeletal: He exhibits no edema or tenderness.  Lymphadenopathy:    He has no cervical adenopathy.  Neurological: He is alert and oriented to person, place, and time.  President--- "Gracy Racer, [place]" [phone], no 87 [id number] Recall 1/3  Decreased sensation in plantar feet--doesn't feel light touch  Skin: No rash noted. No erythema.  No foot lesions  Psychiatric: He has a normal mood and affect. His behavior is normal.          Assessment & Plan:

## 2014-09-27 NOTE — Assessment & Plan Note (Signed)
See social history Forms given 

## 2014-09-27 NOTE — Assessment & Plan Note (Signed)
Mostly resolved I believe his mild cognitive problems are due to this---will just watch

## 2014-09-27 NOTE — Assessment & Plan Note (Signed)
Mild sensory loss No sig pain No Rx indicated

## 2014-09-27 NOTE — Patient Instructions (Signed)
Please set up your diabetic eye exam. 

## 2014-09-28 ENCOUNTER — Encounter: Payer: Self-pay | Admitting: *Deleted

## 2014-09-28 ENCOUNTER — Other Ambulatory Visit: Payer: Self-pay | Admitting: Internal Medicine

## 2014-09-28 NOTE — Telephone Encounter (Signed)
rx called into pharmacy

## 2014-09-28 NOTE — Telephone Encounter (Signed)
07/08/14 

## 2014-09-28 NOTE — Telephone Encounter (Signed)
Approved: 30 x 0 

## 2014-10-04 ENCOUNTER — Other Ambulatory Visit: Payer: Self-pay | Admitting: Internal Medicine

## 2014-10-15 ENCOUNTER — Telehealth: Payer: Self-pay | Admitting: Internal Medicine

## 2014-10-15 NOTE — Telephone Encounter (Signed)
Adrian Singh From [facility] called and Dr. Dalbert Batman would like to have Dr. Alla German most recent accessment and plan on the cyst.  Pt's appointment is scheduled for 10/22/13.  Fax number accessment and plan on Monday 10/18/14  to [phone], Attention Judson Roch / lt

## 2014-10-16 NOTE — Telephone Encounter (Signed)
You can send my last note but I only made a passing comment about the cyst

## 2014-10-18 NOTE — Telephone Encounter (Signed)
09/27/14 office note faxed to Vincent at [facility] office.

## 2014-11-04 LAB — HM DIABETES EYE EXAM

## 2014-11-05 ENCOUNTER — Other Ambulatory Visit: Payer: Self-pay | Admitting: *Deleted

## 2014-11-05 ENCOUNTER — Telehealth: Payer: Self-pay

## 2014-11-05 MED ORDER — RIVAROXABAN 20 MG PO TABS: 20.0000 mg | ORAL_TABLET | Freq: Every day | ORAL | Status: DC

## 2014-11-05 NOTE — Telephone Encounter (Signed)
Pt left note; metformin HCL ER 500 mg tabs are upsetting stomach and pt has diarrhea; the tamsulosin 0.4 mg co pay has increased from $6.00 to $80.00 a month. Is there substitute meds for pt. Pt request cb. [facility]. Pt had annual exam 09/27/2014.

## 2014-11-05 NOTE — Telephone Encounter (Signed)
Left message that samples were available at front desk.

## 2014-11-08 NOTE — Telephone Encounter (Signed)
Patient notified as instructed by telephone and verbalized understanding. 

## 2014-11-08 NOTE — Telephone Encounter (Signed)
Let him know that if the loose stools don't improve (and metformin commonly does this), I can try to double his glimepiride to 4mg  bid instead of another more expensive med

## 2014-11-08 NOTE — Telephone Encounter (Signed)
Please find out if he is taking the metformin after eating or before. It may help if he takes it on a full stomach. There is no other med to take its place that isn't very expensive  The tamsulosin is a surprise. I don't even think the cash price is that much If he has to change, he can try doxazosin 4mg  daily instead (but he would need to be very careful about possible dizziness). If he wants send Rx for 1 year and take off the tamsulosin. He may want to check with pharmacist first about cash price

## 2014-11-08 NOTE — Telephone Encounter (Signed)
Patient notified as instructed by telephone and verbalized understanding. Patient stated that he has been taking the Metformin after breakfast and supper and it does not seem to matter how full his stomach is. Patient stated that this started about 3 weeks ago and he thought that he might have a stomach virus, but it has not gotten any better. Patient stated that he is going to check around and see what the cash price is for tamsulosin before switching to a different medication.

## 2014-11-10 ENCOUNTER — Encounter: Payer: Self-pay | Admitting: Internal Medicine

## 2014-11-24 ENCOUNTER — Other Ambulatory Visit: Payer: Self-pay | Admitting: Internal Medicine

## 2014-12-01 ENCOUNTER — Other Ambulatory Visit: Payer: Self-pay | Admitting: Internal Medicine

## 2014-12-01 NOTE — Telephone Encounter (Signed)
09/28/14 

## 2014-12-01 NOTE — Telephone Encounter (Signed)
[facility]

## 2014-12-01 NOTE — Telephone Encounter (Signed)
Approved: 30 x 0 

## 2014-12-07 ENCOUNTER — Other Ambulatory Visit: Payer: Self-pay | Admitting: Internal Medicine

## 2014-12-10 ENCOUNTER — Telehealth: Payer: Self-pay | Admitting: Cardiology

## 2014-12-10 NOTE — Telephone Encounter (Signed)
Pt called in stating that he needed some samples of Xarelto. Please f/u with pt  Thanks

## 2014-12-10 NOTE — Telephone Encounter (Signed)
Left message no samples If need some  called into local  Pharmacy.

## 2014-12-13 ENCOUNTER — Telehealth: Payer: Self-pay | Admitting: Cardiology

## 2014-12-13 NOTE — Telephone Encounter (Signed)
Returned call to patient.Office out of Xarelto 20 mg samples.

## 2014-12-13 NOTE — Telephone Encounter (Signed)
Pt would like some samples of Xarelto please. °

## 2014-12-22 ENCOUNTER — Telehealth: Payer: Self-pay

## 2014-12-22 ENCOUNTER — Telehealth: Payer: Self-pay | Admitting: Cardiology

## 2014-12-22 NOTE — Telephone Encounter (Signed)
Pt left v/m that cost of generic flomax for 3 month supply increased from $18.00 to $240.00 and pt cannot afford to get med; pt request substitute med less expensive sent to [facility].Please advise. Pt last annual exam 09/27/2014.

## 2014-12-22 NOTE — Telephone Encounter (Signed)
Pt would like samples of Xarelto please.

## 2014-12-22 NOTE — Telephone Encounter (Signed)
Returned call to patient Xarelto 20 mg samples left at front desk of [name] office. 

## 2014-12-23 NOTE — Telephone Encounter (Signed)
They increased the price probably because it is available OTC Have him check [facility] or [facility] if he can The OTC price is ~$50 for 3 months!!

## 2014-12-23 NOTE — Telephone Encounter (Signed)
Tamsulosin (flomax) is OTC now? For prostate?

## 2014-12-23 NOTE — Telephone Encounter (Signed)
Sorry I read flonase   It is not OTC  Not sure why it would go up so much  He can check cash prices (would probably be less than this) Can try a different medication (terazosin) but it may not be as good and more likely to cause dizziness  Find out what he wants to do

## 2014-12-24 NOTE — Telephone Encounter (Signed)
Spoke with patient and he has several concerns and scheduled an office visit on monday

## 2014-12-24 NOTE — Telephone Encounter (Signed)
.  left message to have patient return my call.  

## 2014-12-27 ENCOUNTER — Other Ambulatory Visit: Payer: Self-pay | Admitting: Internal Medicine

## 2014-12-27 ENCOUNTER — Ambulatory Visit (INDEPENDENT_AMBULATORY_CARE_PROVIDER_SITE_OTHER): Payer: Medicare Other | Admitting: Internal Medicine

## 2014-12-27 ENCOUNTER — Encounter: Payer: Self-pay | Admitting: Internal Medicine

## 2014-12-27 VITALS — BP 126/88 | HR 76 | Temp 97.8°F | Wt 215.0 lb

## 2014-12-27 DIAGNOSIS — N400 Enlarged prostate without lower urinary tract symptoms: Secondary | ICD-10-CM | POA: Diagnosis not present

## 2014-12-27 DIAGNOSIS — R197 Diarrhea, unspecified: Secondary | ICD-10-CM

## 2014-12-27 MED ORDER — DOXAZOSIN MESYLATE 4 MG PO TABS: 4.0000 mg | ORAL_TABLET | Freq: Every day | ORAL | Status: DC

## 2014-12-27 NOTE — Progress Notes (Signed)
Pre visit review using our clinic review tool, if applicable. No additional management support is needed unless otherwise documented below in the visit note. 

## 2014-12-27 NOTE — Telephone Encounter (Signed)
12/01/14 

## 2014-12-27 NOTE — Telephone Encounter (Signed)
Approved: 30 x 0 

## 2014-12-27 NOTE — Assessment & Plan Note (Signed)
Seems to be from the metformin Discussed trying imodium before meals

## 2014-12-27 NOTE — Progress Notes (Signed)
Subjective:    Patient ID: Adrian Singh, male    DOB: 02/05/1934, 81 y.o.   MRN: [id number]  HPI Here with wife  The flomax has gone up in price from $18 to $240 for 3 months Discussed doxazosin Takes the med in late morning--- if he takes it earlier, he still gets nocturia.  Has had some dizzy spells with "bright spots" in both eyes Lasts for 20-30 minutes Will sit down and close eyes and it will resolve Mostly if he goes outside or bends over Has had some periods of feeling unstable---has had to hold on as well Did have recent eye exam and everything was okay  Having trouble with the metformin Has some loose stools after taking---takes it with the meal Only taking 1 at a time Tried fiber years ago--but not since on metformin  Current Outpatient Prescriptions on File Prior to Visit  Medication Sig Dispense Refill  . B Complex Vitamins (VITAMIN B COMPLEX) TABS Take 1 tablet by mouth daily.    Marland Kitchen BAYER CONTOUR NEXT TEST test strip CHECK BLOOD SUGAR 1-2 TIMES DAILY AND AS INSTRUCTED. DX CODE 250.00. 100 each 2  . Cholecalciferol (VITAMIN D) 2000 UNITS CAPS Take 2,000 Units by mouth daily.     . finasteride (PROSCAR) 5 MG tablet Take 1 tablet (5 mg total) by mouth daily. 30 tablet 11  . glimepiride (AMARYL) 4 MG tablet TAKE 1 TABLET WITH BREAKFAST OR FIRST MAIN MEAL OF DAY 90 tablet 1  . LANCETS ULTRA THIN MISC Used to test blood sugar 1-2 times daily dx: 250.00 100 each 0  . metFORMIN (GLUCOPHAGE-XR) 500 MG 24 hr tablet TAKE 2 TABLETS BY MOUTH DAILY WITH BREAKFAST. 180 tablet 1  . metoprolol succinate (TOPROL-XL) 50 MG 24 hr tablet TAKE 1 TABLET EVERY DAY 90 tablet 1  . pantoprazole (PROTONIX) 40 MG tablet Take 40 mg by mouth daily.    . ramipril (ALTACE) 2.5 MG capsule TAKE 1 CAPSULE (2.5 MG TOTAL) BY MOUTH DAILY. 90 capsule 3  . rivaroxaban (XARELTO) 20 MG TABS tablet Take 1 tablet (20 mg total) by mouth daily with supper. 20 tablet 0  . tamsulosin (FLOMAX) 0.4 MG CAPS capsule  TAKE ONE CAPSULE BY MOUTH DAILY 90 capsule 1  . triamterene-hydrochlorothiazide (MAXZIDE-25) 37.5-25 MG per tablet Patient takes 1/4 tablet as needed    . vitamin B-12 (CYANOCOBALAMIN) 1000 MCG tablet Take 1,000 mcg by mouth daily.     No current facility-administered medications on file prior to visit.    No Known Allergies  Past Medical History  Diagnosis Date  . Hypertrophy of prostate without urinary obstruction and other lower urinary tract symptoms (LUTS)   . Type II or unspecified type diabetes mellitus without mention of complication, not stated as uncontrolled   . Esophageal reflux   . Unspecified essential hypertension   . Osteoarthrosis, unspecified whether generalized or localized, unspecified site   . Plantar wart   . Tachycardia, unspecified   . Orthostatic lightheadedness   . Atrial fibrillation     x1  . CAD (coronary artery disease)     nonobstructive  . Chronic kidney disease, stage III (moderate)   . History of CVA (cerebrovascular accident)     Past Surgical History  Procedure Laterality Date  . Esophagogastroduodenoscopy  2001    with dilation  . Back surgery  2001  . Rotator cuff repair  2005    Left    Family History  Problem Relation Age of Onset  .  Stroke Father   . Peripheral vascular disease Father     amputation  . Heart failure Mother     CHF  . Coronary artery disease Mother   . Heart attack Mother     Multiple  . COPD Brother   . Heart disease Brother   . Cancer Brother     ?  . Lung cancer Sister     History   Social History  . Marital Status: Married    Spouse Name: N/A  . Number of Children: 4  . Years of Education: N/A   Occupational History  . 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 Smoking status: Former Research scientist (life sciences)  . Smokeless tobacco: Never Used     Comment: Quit after just a few years  . Alcohol Use: No  . Drug Use: No  . Sexual Activity: Not on file   Other Topics Concern  . Not on  file   Social History Narrative   No living will   Plans wife and then children to make health care decisions for him if unable   Would request at least attempts at resuscitation but no prolonged life support   Doesn't think he would want tube feeds if cognitively unaware   Review of Systems Weight is up a few pounds Sleeps okay    Objective:   Physical Exam  Constitutional: He appears well-developed and well-nourished. No distress.  Neck: Normal range of motion. Neck supple. No thyromegaly present.  Cardiovascular: Normal rate, regular rhythm and normal heart sounds.  Exam reveals no gallop.   No murmur heard. Pulmonary/Chest: Effort normal and breath sounds normal. No respiratory distress. He has no wheezes. He has no rales.  Abdominal: Soft. There is no tenderness.  Musculoskeletal: He exhibits no edema.  Lymphadenopathy:    He has no cervical adenopathy.  Psychiatric: He has a normal mood and affect. His behavior is normal.          Assessment & Plan:

## 2014-12-27 NOTE — Patient Instructions (Signed)
Please try imodium 2mg  before meals to see if that keeps you from having the diarrhea. Take the metformin after finishing the meal. If this helps, you may want to take both metformin after breakfast and limit to 1 imodium a day

## 2014-12-27 NOTE — Telephone Encounter (Signed)
rx called into pharmacy

## 2014-12-27 NOTE — Assessment & Plan Note (Signed)
Satisfied with tamsulosin but too expensive Discussed shopping around for insurance for next year Will try changing to doxazosin

## 2015-01-16 ENCOUNTER — Other Ambulatory Visit: Payer: Self-pay | Admitting: Internal Medicine

## 2015-01-17 NOTE — Telephone Encounter (Signed)
rx called into pharmacy

## 2015-01-17 NOTE — Telephone Encounter (Signed)
Approved: 30 x 0 

## 2015-01-17 NOTE — Telephone Encounter (Signed)
12/27/14 

## 2015-01-27 ENCOUNTER — Ambulatory Visit (INDEPENDENT_AMBULATORY_CARE_PROVIDER_SITE_OTHER): Payer: Medicare Other | Admitting: Cardiology

## 2015-01-27 ENCOUNTER — Encounter: Payer: Self-pay | Admitting: Cardiology

## 2015-01-27 VITALS — BP 130/68 | HR 85 | Ht 70.0 in | Wt 216.5 lb

## 2015-01-27 DIAGNOSIS — I10 Essential (primary) hypertension: Secondary | ICD-10-CM

## 2015-01-27 DIAGNOSIS — I2583 Coronary atherosclerosis due to lipid rich plaque: Principal | ICD-10-CM

## 2015-01-27 DIAGNOSIS — I2510 Atherosclerotic heart disease of native coronary artery without angina pectoris: Secondary | ICD-10-CM | POA: Diagnosis not present

## 2015-01-27 DIAGNOSIS — Z7901 Long term (current) use of anticoagulants: Secondary | ICD-10-CM

## 2015-01-27 DIAGNOSIS — I480 Paroxysmal atrial fibrillation: Secondary | ICD-10-CM | POA: Diagnosis not present

## 2015-01-27 NOTE — Patient Instructions (Signed)
Stop taking Cardura  Continue your other therapy  I will see you in 6 months

## 2015-01-27 NOTE — Progress Notes (Signed)
Adrian Singh Date of Birth: 02/04/1934   History of Present Illness: Adrian Singh is seen today for evaluation of dizziness. He has a history of nonobstructive coronary disease by cardiac catheterization 2009. He also has a history of orthostatic dizziness. He was admitted in July 8299 with an embolic stroke presumably due to paroxysmal Afib. He was anticoagulated with Eliquis. The patient developed tachycardia and HA on Eliquis that resolved when he was switched to Xarelto. Previous orthostatic dizziness improved with stopping diuretic. Apparently triamterene HCT resumed for for swelling. He was switched from Flomax to Cardura one month ago. Since then he has noted increased orthostatic dizziness. Feels increased weakness and sees spots in vision. Worse with activity, bending, or stooping.  Current Outpatient Prescriptions on File Prior to Visit  Medication Sig Dispense Refill  . B Complex Vitamins (VITAMIN B COMPLEX) TABS Take 1 tablet by mouth daily.    . Cholecalciferol (VITAMIN D) 2000 UNITS CAPS Take 2,000 Units by mouth daily.     Marland Kitchen doxazosin (CARDURA) 4 MG tablet Take 1 tablet (4 mg total) by mouth daily. 30 tablet 11  . finasteride (PROSCAR) 5 MG tablet Take 1 tablet (5 mg total) by mouth daily. 30 tablet 11  . glimepiride (AMARYL) 4 MG tablet TAKE 1 TABLET WITH BREAKFAST OR FIRST MAIN MEAL OF DAY 90 tablet 1  . LANCETS ULTRA THIN MISC Used to test blood sugar 1-2 times daily dx: 250.00 100 each 0  . metFORMIN (GLUCOPHAGE-XR) 500 MG 24 hr tablet TAKE 2 TABLETS BY MOUTH DAILY WITH BREAKFAST. 180 tablet 1  . metoprolol succinate (TOPROL-XL) 50 MG 24 hr tablet TAKE 1 TABLET EVERY DAY 90 tablet 1  . pantoprazole (PROTONIX) 40 MG tablet Take 40 mg by mouth daily.    . ramipril (ALTACE) 2.5 MG capsule TAKE 1 CAPSULE (2.5 MG TOTAL) BY MOUTH DAILY. 90 capsule 3  . rivaroxaban (XARELTO) 20 MG TABS tablet Take 1 tablet (20 mg total) by mouth daily with supper. 20 tablet 0  . traMADol (ULTRAM)  50 MG tablet TAKE 1 TABLET BY MOUTH 3 TIMES A DAY AS NEEDED FOR PAIN 30 tablet 0  . triamterene-hydrochlorothiazide (MAXZIDE-25) 37.5-25 MG per tablet Patient takes 1/4 tablet as needed    . triamterene-hydrochlorothiazide (MAXZIDE-25) 37.5-25 MG per tablet TAKE 1 TABLET BY MOUTH EVERY DAY 90 tablet 0  . vitamin B-12 (CYANOCOBALAMIN) 1000 MCG tablet Take 1,000 mcg by mouth daily.    Marland Kitchen BAYER CONTOUR NEXT TEST test strip CHECK BLOOD SUGAR 1-2 TIMES DAILY AND AS INSTRUCTED. DX CODE 250.00. 100 each 2   No current facility-administered medications on file prior to visit.    No Known Allergies  Past Medical History  Diagnosis Date  . Hypertrophy of prostate without urinary obstruction and other lower urinary tract symptoms (LUTS)   . Type II or unspecified type diabetes mellitus without mention of complication, not stated as uncontrolled   . Esophageal reflux   . Unspecified essential hypertension   . Osteoarthrosis, unspecified whether generalized or localized, unspecified site   . Plantar wart   . Tachycardia, unspecified   . Orthostatic lightheadedness   . Atrial fibrillation     x1  . CAD (coronary artery disease)     nonobstructive  . Chronic kidney disease, stage III (moderate)   . History of CVA (cerebrovascular accident)     Past Surgical History  Procedure Laterality Date  . Esophagogastroduodenoscopy  2001    with dilation  . Back surgery  2001  .  Rotator cuff repair  2005    Left    History  Smoking status  . Former Smoker  Smokeless tobacco  . Never Used    Comment: Quit after just a few years    History  Alcohol Use No    Family History  Problem Relation Age of Onset  . Stroke Father   . Peripheral vascular disease Father     amputation  . Heart failure Mother     CHF  . Coronary artery disease Mother   . Heart attack Mother     Multiple  . COPD Brother   . Heart disease Brother   . Cancer Brother     ?  . Lung cancer Sister     Review of  Systems: The review of systems is positive for orthostatic lightheadedness.  All other systems were reviewed and are negative.  Physical Exam: BP 130/68 mmHg  Pulse 85  Ht 5\' 10"  (1.778 m)  Wt 216 lb 8 oz (98.204 kg)  BMI 31.06 kg/m2 He is a pleasant white male in no acute distress.  The HEENT exam is normal.  The carotids are 2+ without bruits.  There is no thyromegaly.  There is no JVD.  The lungs are clear.    The heart exam reveals a regular rate with a normal S1 and S2.  There are no murmurs, gallops, or rubs.  The PMI is not displaced.   Abdominal exam reveals good bowel sounds.    There is no hepatosplenomegaly or tenderness.  There are no masses.  Exam of the legs reveal no clubbing, cyanosis, or edema. The distal pulses are intact.  Cranial nerves II - XII are intact.  Motor and sensory functions are intact.  The gait is normal.  LABORATORY DATA: Echo 04/01/14: Study Conclusions  - Left ventricle: The cavity size was normal. Wall thickness was increased in a pattern of mild LVH. Systolic function was vigorous. The estimated ejection fraction was in the range of 65% to 70%. Doppler parameters are consistent with abnormal left ventricular relaxation (grade 1 diastolic dysfunction).     Assessment / Plan: 1. Orthostatic dizziness. Worse on Cardura. Recommend he stop taking this. Had similar symptoms on Flomax but not as bad. Would try to stay off alpha blockers completely. If symptoms persist would try and reduce tramterene HCT by half.   2. Hypertension, controlled.  3. Nonobstructive coronary disease, asymptomatic.  4. Paroxysmal Afib. Continue metoprolol and Xarelto.  5. History of CVA.  I will follow up in 6 months.

## 2015-02-06 ENCOUNTER — Other Ambulatory Visit: Payer: Self-pay | Admitting: Internal Medicine

## 2015-02-16 ENCOUNTER — Other Ambulatory Visit: Payer: Self-pay | Admitting: Cardiology

## 2015-02-16 MED ORDER — RIVAROXABAN 20 MG PO TABS: 20.0000 mg | ORAL_TABLET | Freq: Every day | ORAL | Status: DC

## 2015-02-16 NOTE — Telephone Encounter (Signed)
Pt called in wanting some samples of Xarelto . Please call him back.  Thanks

## 2015-02-16 NOTE — Telephone Encounter (Signed)
Spoke with pt and told him there's no samples of xarelto, #30 was send in to pharmacy

## 2015-03-07 ENCOUNTER — Ambulatory Visit: Payer: Self-pay | Admitting: Surgery

## 2015-03-07 NOTE — H&P (Signed)
History of Present Illness Adrian Singh. Adrian Decandia MD; 03/07/2015 1:43 PM) Patient words: scalp cyst.  The patient is a 81 year old male who presents with a complaint of Mass. This is a 81 year old male who presents with about 1 year of an enlarging mass on the back of his scalp. This has begun to protrude and causes significant tenderness and headaches. It has never been infected or had any drainage. He would like to have this area excised. He is on Xarelto for history of stroke.  Cardiology - Dr. Martinique. Other Problems Marjean Donna, CMA; 03/07/2015 11:38 AM) Arthritis Back Pain Cerebrovascular Accident Diabetes Mellitus Enlarged Prostate Gastroesophageal Reflux Disease Heart murmur High blood pressure Melanoma  Past Surgical History Marjean Donna, CMA; 03/07/2015 11:38 AM) Shoulder Surgery Left.  Diagnostic Studies History Marjean Donna, CMA; 03/07/2015 11:38 AM) Colonoscopy 5-10 years ago  Allergies Davy Pique Bynum, CMA; 03/07/2015 11:40 AM) No Known Drug Allergies 03/07/2015  Medication History (Sonya Bynum, CMA; 03/07/2015 11:40 AM) TraMADol HCl (50MG  Tablet, Oral) Active. Doxazosin Mesylate (4MG  Tablet, Oral) Active. Glimepiride (4MG  Tablet, Oral) Active. Metoprolol Succinate ER (50MG  Tablet ER 24HR, Oral) Active. Ramipril (2.5MG  Capsule, Oral) Active. Tamsulosin HCl (0.4MG  Capsule, Oral) Active. Triamterene-HCTZ (37.5-25MG  Tablet, Oral) Active. Pantoprazole Sodium (40MG  Tablet DR, Oral) Active. Finasteride (5MG  Tablet, Oral) Active. Xarelto (20MG  Tablet, Oral) Active. Medications Reconciled  Social History Marjean Donna, CMA; 03/07/2015 11:38 AM) No alcohol use No caffeine use No drug use Tobacco use Former smoker.     Review of Systems Davy Pique Bynum CMA; 03/07/2015 11:38 AM) General Not Present- Appetite Loss, Chills, Fatigue, Fever, Night Sweats, Weight Gain and Weight Loss. Skin Not Present- Change in Wart/Mole, Dryness, Hives, Jaundice, New  Lesions, Non-Healing Wounds, Rash and Ulcer. HEENT Present- Hearing Loss, Ringing in the Ears and Wears glasses/contact lenses. Not Present- Earache, Hoarseness, Nose Bleed, Oral Ulcers, Seasonal Allergies, Sinus Pain, Sore Throat, Visual Disturbances and Yellow Eyes. Respiratory Present- Snoring. Not Present- Bloody sputum, Chronic Cough, Difficulty Breathing and Wheezing. Breast Not Present- Breast Mass, Breast Pain, Nipple Discharge and Skin Changes. Cardiovascular Present- Chest Pain and Rapid Heart Rate. Not Present- Difficulty Breathing Lying Down, Leg Cramps, Palpitations, Shortness of Breath and Swelling of Extremities. Gastrointestinal Not Present- Abdominal Pain, Bloating, Bloody Stool, Change in Bowel Habits, Chronic diarrhea, Constipation, Difficulty Swallowing, Excessive gas, Gets full quickly at meals, Hemorrhoids, Indigestion, Nausea, Rectal Pain and Vomiting. Male Genitourinary Present- Nocturia. Not Present- Blood in Urine, Change in Urinary Stream, Frequency, Impotence, Painful Urination, Urgency and Urine Leakage. Musculoskeletal Present- Back Pain and Joint Pain. Not Present- Joint Stiffness, Muscle Pain, Muscle Weakness and Swelling of Extremities. Neurological Present- Weakness. Not Present- Decreased Memory, Fainting, Headaches, Numbness, Seizures, Tingling, Tremor and Trouble walking. Psychiatric Not Present- Anxiety, Bipolar, Change in Sleep Pattern, Depression, Fearful and Frequent crying. Endocrine Present- Heat Intolerance. Not Present- Cold Intolerance, Excessive Hunger, Hair Changes, Hot flashes and New Diabetes. Hematology Present- Easy Bruising. Not Present- Excessive bleeding, Gland problems, HIV and Persistent Infections.  Vitals (Sonya Bynum CMA; 03/07/2015 11:39 AM) 03/07/2015 11:38 AM Weight: 218.6 lb Height: 69in Body Surface Area: 2.2 m Body Mass Index: 32.28 kg/m Temp.: 56F(Temporal)  Pulse: 77 (Regular)  BP: 128/72 (Sitting, Left Arm,  Standard)     Physical Exam Rodman Key K. Armonie Staten MD; 03/07/2015 1:44 PM)  The physical exam findings are as follows: Note:WDWN in NAD Scalp - posterior - 1.5-2 cm protruding subcutaneous mass; no erythema or induration; no drainage Mildly tender to palpation. Well-circumscribed Neck: No masses, no thyromegaly Lungs:  CTA bilaterally; normal respiratory effort CV: Regular rate and rhythm; no murmurs Abd: +bowel sounds, soft, non-tender, no masses Ext: Well-perfused; no edema Skin: Warm, dry; no sign of jaundice    Assessment & Plan Rodman Key K. Jakeel Starliper MD; 03/07/2015 1:44 PM)  SEBACEOUS CYST (706.2  L72.3)  Current Plans Schedule for Surgery - excision of sebaceous cyst - scalp - 2 cm. The surgical procedure has been discussed with the patient. Potential risks, benefits, alternative treatments, and expected outcomes have been explained. All of the patient's questions at this time have been answered. The likelihood of reaching the patient's treatment goal is good. The patient understand the proposed surgical procedure and wishes to proceed.   We will schedule this after receiving cardiac clearance from Dr. Martinique.  Adrian Singh. Georgette Dover, MD, [facility] Surgery  General/ Trauma Surgery  03/07/2015 1:45 PM

## 2015-03-08 ENCOUNTER — Telehealth: Payer: Self-pay

## 2015-03-08 NOTE — Telephone Encounter (Signed)
Received a surgical clearance from Dr.Matthew Tsuei's office.Dr.Tsuei's office called Dr.Jordan out of office this week,will get a surgical clearance 03/14/15.

## 2015-03-14 ENCOUNTER — Other Ambulatory Visit: Payer: Self-pay | Admitting: Cardiology

## 2015-03-17 ENCOUNTER — Other Ambulatory Visit: Payer: Self-pay | Admitting: Cardiology

## 2015-03-21 ENCOUNTER — Other Ambulatory Visit: Payer: Self-pay | Admitting: Internal Medicine

## 2015-03-22 NOTE — Telephone Encounter (Signed)
Approved: okay #[phone] 

## 2015-03-22 NOTE — Telephone Encounter (Signed)
01/17/15 

## 2015-03-22 NOTE — Telephone Encounter (Signed)
rx called into pharmacy

## 2015-03-25 ENCOUNTER — Telehealth: Payer: Self-pay

## 2015-03-25 NOTE — Telephone Encounter (Signed)
Received surgical clearance from Dr.Tsuei's office.Dr.Jordan cleared patient for surgery.Advised to hold xarelto 48 hours prior to surgery.Form faxed back 03/14/15 to fax # [phone].

## 2015-03-28 ENCOUNTER — Encounter: Payer: Self-pay | Admitting: Internal Medicine

## 2015-03-28 ENCOUNTER — Ambulatory Visit (INDEPENDENT_AMBULATORY_CARE_PROVIDER_SITE_OTHER): Payer: Medicare Other | Admitting: Internal Medicine

## 2015-03-28 ENCOUNTER — Encounter: Payer: Self-pay | Admitting: *Deleted

## 2015-03-28 VITALS — BP 146/80 | HR 85 | Temp 97.8°F | Wt 219.0 lb

## 2015-03-28 DIAGNOSIS — D696 Thrombocytopenia, unspecified: Secondary | ICD-10-CM | POA: Insufficient documentation

## 2015-03-28 DIAGNOSIS — I6340 Cerebral infarction due to embolism of unspecified cerebral artery: Secondary | ICD-10-CM | POA: Diagnosis not present

## 2015-03-28 DIAGNOSIS — E1140 Type 2 diabetes mellitus with diabetic neuropathy, unspecified: Secondary | ICD-10-CM | POA: Diagnosis not present

## 2015-03-28 DIAGNOSIS — N400 Enlarged prostate without lower urinary tract symptoms: Secondary | ICD-10-CM

## 2015-03-28 DIAGNOSIS — I480 Paroxysmal atrial fibrillation: Secondary | ICD-10-CM

## 2015-03-28 DIAGNOSIS — I10 Essential (primary) hypertension: Secondary | ICD-10-CM

## 2015-03-28 DIAGNOSIS — E1142 Type 2 diabetes mellitus with diabetic polyneuropathy: Secondary | ICD-10-CM

## 2015-03-28 LAB — CBC WITH DIFFERENTIAL/PLATELET
Basophils Absolute: 0 10*3/uL (ref 0.0–0.1)
Basophils Relative: 0.5 % (ref 0.0–3.0)
Eosinophils Absolute: 0.1 10*3/uL (ref 0.0–0.7)
Eosinophils Relative: 1.2 % (ref 0.0–5.0)
HCT: 40.5 % (ref 39.0–52.0)
Hemoglobin: 13.4 g/dL (ref 13.0–17.0)
Lymphocytes Relative: 27.8 % (ref 12.0–46.0)
Lymphs Abs: 1.7 10*3/uL (ref 0.7–4.0)
MCHC: 33.2 g/dL (ref 30.0–36.0)
MCV: 86.2 fl (ref 78.0–100.0)
Monocytes Absolute: 0.6 10*3/uL (ref 0.1–1.0)
Monocytes Relative: 9.3 % (ref 3.0–12.0)
Neutro Abs: 3.8 10*3/uL (ref 1.4–7.7)
Neutrophils Relative %: 61.2 % (ref 43.0–77.0)
Platelets: 128 10*3/uL — ABNORMAL LOW (ref 150.0–400.0)
RBC: 4.7 Mil/uL (ref 4.22–5.81)
RDW: 14.2 % (ref 11.5–15.5)
WBC: 6.3 10*3/uL (ref 4.0–10.5)

## 2015-03-28 LAB — LIPID PANEL
Cholesterol: 135 mg/dL (ref 0–200)
HDL: 30.7 mg/dL — ABNORMAL LOW (ref >39.00)
NonHDL: 104.3
Total CHOL/HDL Ratio: 4
Triglycerides: 337 mg/dL — ABNORMAL HIGH (ref 0.0–149.0)
VLDL: 67.4 mg/dL — ABNORMAL HIGH (ref 0.0–40.0)

## 2015-03-28 LAB — COMPREHENSIVE METABOLIC PANEL
ALT: 26 U/L (ref 0–53)
AST: 22 U/L (ref 0–37)
Albumin: 4.1 g/dL (ref 3.5–5.2)
Alkaline Phosphatase: 77 U/L (ref 39–117)
BUN: 28 mg/dL — ABNORMAL HIGH (ref 6–23)
CO2: 28 mEq/L (ref 19–32)
Calcium: 9.3 mg/dL (ref 8.4–10.5)
Chloride: 105 mEq/L (ref 96–112)
Creatinine, Ser: 1.73 mg/dL — ABNORMAL HIGH (ref 0.40–1.50)
GFR: 40.74 mL/min — ABNORMAL LOW (ref >60.00)
Glucose, Bld: 154 mg/dL — ABNORMAL HIGH (ref 70–99)
Potassium: 4.7 mEq/L (ref 3.5–5.1)
Sodium: 139 mEq/L (ref 135–145)
Total Bilirubin: 0.5 mg/dL (ref 0.2–1.2)
Total Protein: 7.4 g/dL (ref 6.0–8.3)

## 2015-03-28 LAB — LDL CHOLESTEROL, DIRECT: Direct LDL: 77 mg/dL

## 2015-03-28 LAB — HEMOGLOBIN A1C: Hgb A1c MFr Bld: 7.2 % — ABNORMAL HIGH (ref 4.6–6.5)

## 2015-03-28 LAB — T4, FREE: Free T4: 0.83 ng/dL (ref 0.60–1.60)

## 2015-03-28 NOTE — Assessment & Plan Note (Signed)
Okay on the finasteride If worsens, will need tamsulosin despite the price

## 2015-03-28 NOTE — Assessment & Plan Note (Signed)
Continues on the xarelto and no new symptoms

## 2015-03-28 NOTE — Assessment & Plan Note (Signed)
Still seems to have good control Will check labs Mild pain in feet--no Rx needed

## 2015-03-28 NOTE — Assessment & Plan Note (Signed)
Regular on metoprolol Remains on the xarelto

## 2015-03-28 NOTE — Assessment & Plan Note (Signed)
BP Readings from Last 3 Encounters:  03/28/15 146/80  01/27/15 130/68  12/27/14 126/88   Up a little today Asked him to take the diuretic daily unless he gets dizzy

## 2015-03-28 NOTE — Progress Notes (Signed)
Pre visit review using our clinic review tool, if applicable. No additional management support is needed unless otherwise documented below in the visit note. 

## 2015-03-28 NOTE — Progress Notes (Signed)
Subjective:    Patient ID: Adrian Singh, male    DOB: 09-07-1934, 81 y.o.   MRN: [id number]  HPI Here for follow up of diabetes and other chronic medical conditions  Dizziness is better since stopping the doxazosin Satisfied with voiding only on the finasteride If worsens, will need to retry the tamsulosin  No chest pain No SOB  Still notes some trouble with balance--- and early DOE--then it fades away and he can continue his yard work No palpitations No edema with the fluid pill twice a week or so  Checks sugars once in a while Usually 130-140 fasting No apparent low sugar reactions in the past 6 months Still gets stinging in feet--but this has improved  Current Outpatient Prescriptions on File Prior to Visit  Medication Sig Dispense Refill  . B Complex Vitamins (VITAMIN B COMPLEX) TABS Take 1 tablet by mouth daily.    Marland Kitchen BAYER CONTOUR NEXT TEST test strip CHECK BLOOD SUGAR 1-2 TIMES DAILY AND AS INSTRUCTED. DX CODE 250.00. 100 each 2  . Cholecalciferol (VITAMIN D) 2000 UNITS CAPS Take 2,000 Units by mouth daily.     Marland Kitchen glimepiride (AMARYL) 4 MG tablet TAKE 1 TABLET WITH BREAKFAST OR FIRST MAIN MEAL OF DAY 90 tablet 3  . LANCETS ULTRA THIN MISC Used to test blood sugar 1-2 times daily dx: 250.00 100 each 0  . metFORMIN (GLUCOPHAGE-XR) 500 MG 24 hr tablet TAKE 2 TABLETS BY MOUTH DAILY WITH BREAKFAST. 180 tablet 1  . metoprolol succinate (TOPROL-XL) 50 MG 24 hr tablet TAKE 1 TABLET EVERY DAY 90 tablet 1  . pantoprazole (PROTONIX) 40 MG tablet TAKE 1 TABLET BY MOUTH EVERY DAY 90 tablet 3  . ramipril (ALTACE) 2.5 MG capsule TAKE 1 CAPSULE (2.5 MG TOTAL) BY MOUTH DAILY. 90 capsule 3  . traMADol (ULTRAM) 50 MG tablet TAKE 1 TABLET BY MOUTH 3 TIMES A DAY AS NEEDED FOR PAIN 30 tablet 0  . triamterene-hydrochlorothiazide (MAXZIDE-25) 37.5-25 MG per tablet TAKE 1 TABLET BY MOUTH EVERY DAY 90 tablet 0  . vitamin B-12 (CYANOCOBALAMIN) 1000 MCG tablet Take 1,000 mcg by mouth daily.    [name] 20 MG TABS tablet TAKE 1 TABLET (20 MG TOTAL) BY MOUTH DAILY WITH SUPPER. 30 tablet 5   No current facility-administered medications on file prior to visit.    No Known Allergies  Past Medical History  Diagnosis Date  . Hypertrophy of prostate without urinary obstruction and other lower urinary tract symptoms (LUTS)   . Type II or unspecified type diabetes mellitus without mention of complication, not stated as uncontrolled   . Esophageal reflux   . Unspecified essential hypertension   . Osteoarthrosis, unspecified whether generalized or localized, unspecified site   . Plantar wart   . Tachycardia, unspecified   . Orthostatic lightheadedness   . Atrial fibrillation     x1  . CAD (coronary artery disease)     nonobstructive  . Chronic kidney disease, stage III (moderate)   . History of CVA (cerebrovascular accident)     Past Surgical History  Procedure Laterality Date  . Esophagogastroduodenoscopy  2001    with dilation  . Back surgery  2001  . Rotator cuff repair  2005    Left    Family History  Problem Relation Age of Onset  . Stroke Father   . Peripheral vascular disease Father     amputation  . Heart failure Mother     CHF  . Coronary artery disease  Mother   . Heart attack Mother     Multiple  . COPD Brother   . Heart disease Brother   . Cancer Brother     ?  . Lung cancer Sister     History   Social History  . Marital Status: Married    Spouse Name: N/A  . Number of Children: 4  . Years of Education: N/A   Occupational History  . 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 Smoking status: Former Research scientist (life sciences)  . Smokeless tobacco: Never Used     Comment: Quit after just a few years  . Alcohol Use: No  . Drug Use: No  . Sexual Activity: Not on file   Other Topics Concern  . Not on file   Social History Narrative   No living will   Plans wife and then children to make health care decisions for him if unable   Would  request at least attempts at resuscitation but no prolonged life support   Doesn't think he would want tube feeds if cognitively unaware   Review of Systems Sleeps well Appetite is good Weight is stable Bowels are better--- no problem with loose bowels on the metformin Getting knot taken off back of head later this month    Objective:   Physical Exam  Constitutional: He appears well-developed and well-nourished. No distress.  Neck: Normal range of motion. Neck supple. No thyromegaly present.  Cardiovascular: Normal rate, regular rhythm, normal heart sounds and intact distal pulses.  Exam reveals no gallop.   No murmur heard. Pulmonary/Chest: Effort normal and breath sounds normal. No respiratory distress. He has no wheezes. He has no rales.  Musculoskeletal: He exhibits no edema or tenderness.  Lymphadenopathy:    He has no cervical adenopathy.  Skin:  Mycotic toenails Callous in mid right plantar foot  Psychiatric: He has a normal mood and affect. His behavior is normal.          Assessment & Plan:

## 2015-04-01 ENCOUNTER — Encounter (HOSPITAL_COMMUNITY)
Admission: RE | Admit: 2015-04-01 | Discharge: 2015-04-01 | Disposition: A | Discharge status: Home / Self Care | Payer: Medicare Other | Source: Ambulatory Visit | Attending: Surgery | Admitting: Surgery

## 2015-04-01 ENCOUNTER — Encounter (HOSPITAL_COMMUNITY): Payer: Self-pay

## 2015-04-01 DIAGNOSIS — Z01818 Encounter for other preprocedural examination: Secondary | ICD-10-CM | POA: Insufficient documentation

## 2015-04-01 HISTORY — DX: Malignant (primary) neoplasm, unspecified: C80.1

## 2015-04-01 HISTORY — DX: Cerebral infarction, unspecified: I63.9

## 2015-04-01 NOTE — Pre-Procedure Instructions (Signed)
Adrian Singh  04/01/2015      [facility] [place], [place] [address] Phone: [phone] Fax: [phone]  [id number] [place], [facility] [place] [address] Phone: [phone] Fax: [phone]    Your procedure is scheduled on  Wednesday, April 06, 2015  Report to [facility] Admitting at 9:45 A.M.  Call this number if you have problems the morning of surgery:  [phone]   Remember: Follow doctors instructions to hold Xarelto 48 hours prior to surgery.  Do not eat food or drink liquids after midnight Tuesday, April 05, 2015  Take these medicines the morning of surgery with A SIP OF WATER :finasteride (PROSCAR),  metoprolol succinate (TOPROL-XL), pantoprazole (PROTONIX), if needed:traMADol (ULTRAM) for pain. DO NOT take any diabetic medications the morning of procedure such as metFORMIN (GLUCOPHAGE-XR) and glimepiride (AMARYL)  Stop taking Aspirin, vitamins, and herbal medications. Do not take any NSAIDs ie: Ibuprofen, Advil, Naproxen or any medication containing Aspirin; stop now.   Do not wear jewelry, make-up or nail polish.  Do not wear lotions, powders, or perfumes.  You may not wear deodorant.  Do not shave 48 hours prior to surgery.  Men may shave face and neck.  Do not bring valuables to the hospital.  [facility] is not responsible for any belongings or valuables.  Contacts, dentures or bridgework may not be worn into surgery.  Leave your suitcase in the car.  After surgery it may be brought to your room.  For patients admitted to the hospital, discharge time will be determined by your treatment team.  Patients discharged the day of surgery will not be allowed to drive home.   Name and phone number of your driver:  Special instructions: Special Instructions:Special Instructions: [facility] - Preparing for Surgery  Before surgery, you can play  an important role.  Because skin is not sterile, your skin needs to be as free of germs as possible.  You can reduce the number of germs on you skin by washing with CHG (chlorahexidine gluconate) soap before surgery.  CHG is an antiseptic cleaner which kills germs and bonds with the skin to continue killing germs even after washing.  Please DO NOT use if you have an allergy to CHG or antibacterial soaps.  If your skin becomes reddened/irritated stop using the CHG and inform your nurse when you arrive at Short Stay.  Do not shave (including legs and underarms) for at least 48 hours prior to the first CHG shower.  You may shave your face.  Please follow these instructions carefully:   1.  Shower with CHG Soap the night before surgery and the morning of Surgery.  2.  If you choose to wash your hair, wash your hair first as usual with your normal shampoo.  3.  After you shampoo, rinse your hair and body thoroughly to remove the Shampoo.  4.  Use CHG as you would any other liquid soap.  You can apply chg directly  to the skin and wash gently with scrungie or a clean washcloth.  5.  Apply the CHG Soap to your body ONLY FROM THE NECK DOWN.  Do not use on open wounds or open sores.  Avoid contact with your eyes, ears, mouth and genitals (private parts).  Wash genitals (private parts) with your normal soap.  6.  Wash thoroughly, paying special attention to the area where  your surgery will be performed.  7.  Thoroughly rinse your body with warm water from the neck down.  8.  DO NOT shower/wash with your normal soap after using and rinsing off the CHG Soap.  9.  Pat yourself dry with a clean towel.            10.  Wear clean pajamas.            11.  Place clean sheets on your bed the night of your first shower and do not sleep with pets.  Day of Surgery  Do not apply any lotions/deodorants the morning of surgery.  Please wear clean clothes to the hospital/surgery center.  Please read over the following  fact sheets that you were given. Pain Booklet, Coughing and Deep Breathing and Surgical Site Infection Prevention

## 2015-04-01 NOTE — Progress Notes (Addendum)
Pt currently denies SOB and chest pain but stated that he has experienced SOB with exertion. Pt is currently under the care of Dr. Martinique, cardiology and Dr. Silvio Pate, PCP. Spoke with Ebony Hail, [place], anesthesia regarding creatinine levels; current labs okay. Pt chart forwarded to [place], [place], anesthesia to review cardiac history and clearance note in pt chart. Pt stated that a stress test was done >10 years ago and an echo and cardiac cath was done " at least 8 years ago."

## 2015-04-01 NOTE — Progress Notes (Signed)
   04/01/15 1434  OBSTRUCTIVE SLEEP APNEA  Have you ever been diagnosed with sleep apnea through a sleep study? No  Do you snore loudly (loud enough to be heard through closed doors)?  1  Do you often feel tired, fatigued, or sleepy during the daytime? 0  Has anyone observed you stop breathing during your sleep? 0  Do you have, or are you being treated for high blood pressure? 1  BMI more than 35 kg/m2? 0  Age over 79 years old? 1  Neck circumference greater than 40 cm/16 inches? 0  Gender: 1  Obstructive Sleep Apnea Score 4

## 2015-04-04 NOTE — Progress Notes (Signed)
Anesthesia Chart Review: Patient is a 81 year old male scheduled for excision of scalp sebaceous cyst on 04/06/15 by Dr. Georgette Dover.  History includes former smoker, afib (PAF), HTN with history of orthostasis, mild non-obstructive CAD '09, DM2, BPH, GERD, CKD stage III, thrombocytopenia, TIA 03/2014, exertional dyspnea, skin cancer, back surgery.    Cardiologist Dr. Martinique signed a note clearing patient for surgery from a cardiac standpoint with permission to hold Xarelto 48 hours prior to surgery.   Meds include Proscar, glimepiride, metformin, Toprol XL, Protonix, Altace, tramadol, Maxzide-25, Xarelto.   04/01/14 Echo: Study Conclusions - Left ventricle: The cavity size was normal. Wall thickness was increased in a pattern of mild LVH. Systolic function was vigorous. The estimated ejection fraction was in the range of 65% to 70%. Doppler parameters are consistent with abnormal left ventricular relaxation (grade 1 diastolic dysfunction).  01/06/08 Cardiac cath:  FINAL INTERPRETATION: 1. Mild nonobstructive atherosclerotic coronary artery disease (40% mid LAD, < 10% LCX, 20% proximal RCA). 2. Normal left ventricular function.  04/01/14 Carotid duplex: Summary: - The vertebral arteries appear patent with antegrade flow. - Findings consistent with 1 - 39 percent stenosis involving the right internal carotid artery and the left internal carotid artery.  04/23/14 EKG: NSR, inferior infarct (age undetermined).  Labs on 03/28/15 noted. BUN 28, Cr 1.73. H/H 13.4/40.5. PLT 128K. A1C 7.2. These labs were ordered and already reviewed by his PCP Dr. Silvio Pate. His notation states, "Your labs look good.Marland KitchenMarland KitchenThe kidney tests and function (BUN, GFR &creatinine) are stable Mildly low platelet count is stable over many years--no action is needed..."  Patient has been cleared by his cardiologist.  His PCP felt recent labs were stable. If no acute changes then I would anticipate that he could proceed as  planned.  George [facility] Short Stay Center/Anesthesiology Phone [phone] 04/04/2015 1:06 PM

## 2015-04-06 ENCOUNTER — Ambulatory Visit (HOSPITAL_COMMUNITY): Payer: Medicare Other | Admitting: Vascular Surgery

## 2015-04-06 ENCOUNTER — Encounter (HOSPITAL_COMMUNITY): Payer: Self-pay | Admitting: *Deleted

## 2015-04-06 ENCOUNTER — Ambulatory Visit (HOSPITAL_COMMUNITY)
Admission: RE | Admit: 2015-04-06 | Discharge: 2015-04-06 | Disposition: A | Discharge status: Home / Self Care | Payer: Medicare Other | Source: Ambulatory Visit | Attending: Surgery | Admitting: Surgery

## 2015-04-06 ENCOUNTER — Encounter (HOSPITAL_COMMUNITY)
Admission: RE | Disposition: A | Discharge status: Home / Self Care | Payer: Medicare Other | Source: Ambulatory Visit | Attending: Surgery

## 2015-04-06 ENCOUNTER — Ambulatory Visit (HOSPITAL_COMMUNITY): Payer: Medicare Other | Admitting: Anesthesiology

## 2015-04-06 DIAGNOSIS — Z79891 Long term (current) use of opiate analgesic: Secondary | ICD-10-CM | POA: Diagnosis not present

## 2015-04-06 DIAGNOSIS — I10 Essential (primary) hypertension: Secondary | ICD-10-CM | POA: Insufficient documentation

## 2015-04-06 DIAGNOSIS — N400 Enlarged prostate without lower urinary tract symptoms: Secondary | ICD-10-CM | POA: Diagnosis not present

## 2015-04-06 DIAGNOSIS — Z79899 Other long term (current) drug therapy: Secondary | ICD-10-CM | POA: Diagnosis not present

## 2015-04-06 DIAGNOSIS — M1990 Unspecified osteoarthritis, unspecified site: Secondary | ICD-10-CM | POA: Diagnosis not present

## 2015-04-06 DIAGNOSIS — E119 Type 2 diabetes mellitus without complications: Secondary | ICD-10-CM | POA: Diagnosis not present

## 2015-04-06 DIAGNOSIS — Z85820 Personal history of malignant melanoma of skin: Secondary | ICD-10-CM | POA: Diagnosis not present

## 2015-04-06 DIAGNOSIS — Z87891 Personal history of nicotine dependence: Secondary | ICD-10-CM | POA: Insufficient documentation

## 2015-04-06 DIAGNOSIS — L723 Sebaceous cyst: Secondary | ICD-10-CM | POA: Diagnosis present

## 2015-04-06 DIAGNOSIS — Z7901 Long term (current) use of anticoagulants: Secondary | ICD-10-CM | POA: Diagnosis not present

## 2015-04-06 DIAGNOSIS — L7211 Pilar cyst: Secondary | ICD-10-CM | POA: Diagnosis not present

## 2015-04-06 DIAGNOSIS — K219 Gastro-esophageal reflux disease without esophagitis: Secondary | ICD-10-CM | POA: Diagnosis not present

## 2015-04-06 DIAGNOSIS — Z8673 Personal history of transient ischemic attack (TIA), and cerebral infarction without residual deficits: Secondary | ICD-10-CM | POA: Diagnosis not present

## 2015-04-06 HISTORY — PX: EAR CYST EXCISION: SHX22

## 2015-04-06 LAB — GLUCOSE, CAPILLARY: Glucose-Capillary: 173 mg/dL — ABNORMAL HIGH (ref 65–99)

## 2015-04-06 SURGERY — CYST REMOVAL
Anesthesia: General | Site: Head

## 2015-04-06 MED ORDER — CHLORHEXIDINE GLUCONATE 4 % EX LIQD: 1.0000 "application " | Freq: Once | CUTANEOUS | Status: DC

## 2015-04-06 MED ORDER — 0.9 % SODIUM CHLORIDE (POUR BTL) OPTIME
TOPICAL | Status: DC | PRN
  Administered 2015-04-06: 1000 mL

## 2015-04-06 MED ORDER — ONDANSETRON HCL 4 MG/2ML IJ SOLN
4.0000 mg | INTRAMUSCULAR | Status: DC | PRN
  Filled 2015-04-06: qty 2

## 2015-04-06 MED ORDER — SUCCINYLCHOLINE CHLORIDE 20 MG/ML IJ SOLN
INTRAMUSCULAR | Status: DC | PRN
  Administered 2015-04-06: 100 mg via INTRAVENOUS

## 2015-04-06 MED ORDER — BUPIVACAINE-EPINEPHRINE (PF) 0.25% -1:200000 IJ SOLN
INTRAMUSCULAR | Status: AC
  Filled 2015-04-06: qty 30

## 2015-04-06 MED ORDER — LACTATED RINGERS IV SOLN: INTRAVENOUS | Status: DC | PRN

## 2015-04-06 MED ORDER — MORPHINE SULFATE 2 MG/ML IJ SOLN: 2.0000 mg | INTRAMUSCULAR | Status: DC | PRN

## 2015-04-06 MED ORDER — EPHEDRINE SULFATE 50 MG/ML IJ SOLN
INTRAMUSCULAR | Status: DC | PRN
  Administered 2015-04-06: 5 mg via INTRAVENOUS

## 2015-04-06 MED ORDER — LIDOCAINE HCL (CARDIAC) 20 MG/ML IV SOLN
INTRAVENOUS | Status: AC
  Filled 2015-04-06: qty 5

## 2015-04-06 MED ORDER — SODIUM CHLORIDE 0.9 % IV SOLN
INTRAVENOUS | Status: DC
  Administered 2015-04-06: 10:00:00 via INTRAVENOUS

## 2015-04-06 MED ORDER — ONDANSETRON HCL 4 MG/2ML IJ SOLN
INTRAMUSCULAR | Status: AC
  Filled 2015-04-06: qty 2

## 2015-04-06 MED ORDER — FENTANYL CITRATE (PF) 250 MCG/5ML IJ SOLN
INTRAMUSCULAR | Status: AC
  Filled 2015-04-06: qty 5

## 2015-04-06 MED ORDER — CEFAZOLIN SODIUM-DEXTROSE 2-3 GM-% IV SOLR
2.0000 g | INTRAVENOUS | Status: AC
  Administered 2015-04-06: 2 g via INTRAVENOUS

## 2015-04-06 MED ORDER — FENTANYL CITRATE (PF) 100 MCG/2ML IJ SOLN: 25.0000 ug | INTRAMUSCULAR | Status: DC | PRN

## 2015-04-06 MED ORDER — PROPOFOL 10 MG/ML IV BOLUS
INTRAVENOUS | Status: DC | PRN
  Administered 2015-04-06: 150 mg via INTRAVENOUS

## 2015-04-06 MED ORDER — ONDANSETRON HCL 4 MG/2ML IJ SOLN
INTRAMUSCULAR | Status: DC | PRN
  Administered 2015-04-06: 4 mg via INTRAVENOUS

## 2015-04-06 MED ORDER — HYDROCODONE-ACETAMINOPHEN 5-325 MG PO TABS: 1.0000 | ORAL_TABLET | ORAL | Status: DC | PRN

## 2015-04-06 MED ORDER — LIDOCAINE HCL (CARDIAC) 20 MG/ML IV SOLN
INTRAVENOUS | Status: DC | PRN
  Administered 2015-04-06: 40 mg via INTRAVENOUS

## 2015-04-06 MED ORDER — CEFAZOLIN SODIUM-DEXTROSE 2-3 GM-% IV SOLR
INTRAVENOUS | Status: AC
  Filled 2015-04-06: qty 50

## 2015-04-06 MED ORDER — FENTANYL CITRATE (PF) 100 MCG/2ML IJ SOLN
INTRAMUSCULAR | Status: DC | PRN
  Administered 2015-04-06: 50 ug via INTRAVENOUS
  Administered 2015-04-06 (×2): 25 ug via INTRAVENOUS

## 2015-04-06 MED ORDER — ONDANSETRON HCL 4 MG/2ML IJ SOLN: 4.0000 mg | Freq: Once | INTRAMUSCULAR | Status: DC | PRN

## 2015-04-06 MED ORDER — PROPOFOL 10 MG/ML IV BOLUS
INTRAVENOUS | Status: AC
  Filled 2015-04-06: qty 20

## 2015-04-06 MED ORDER — BUPIVACAINE-EPINEPHRINE 0.25% -1:200000 IJ SOLN
INTRAMUSCULAR | Status: DC | PRN
  Administered 2015-04-06: 2 mL

## 2015-04-06 SURGICAL SUPPLY — 37 items
BENZOIN TINCTURE PRP APPL 2/3 (GAUZE/BANDAGES/DRESSINGS) ×2 IMPLANT
BLADE SURG ROTATE 9660 (MISCELLANEOUS) IMPLANT
CHLORAPREP W/TINT 26ML (MISCELLANEOUS) IMPLANT
CLEANER TIP ELECTROSURG 2X2 (MISCELLANEOUS) ×2 IMPLANT
COVER SURGICAL LIGHT HANDLE (MISCELLANEOUS) ×2 IMPLANT
DECANTER SPIKE VIAL GLASS SM (MISCELLANEOUS) IMPLANT
DRAPE PED LAPAROTOMY (DRAPES) ×2 IMPLANT
DRAPE UTILITY XL STRL (DRAPES) ×4 IMPLANT
ELECT CAUTERY BLADE 6.4 (BLADE) ×2 IMPLANT
ELECT REM PT RETURN 9FT ADLT (ELECTROSURGICAL) ×2
ELECTRODE REM PT RTRN 9FT ADLT (ELECTROSURGICAL) ×1 IMPLANT
GAUZE SPONGE 4X4 12PLY STRL (GAUZE/BANDAGES/DRESSINGS) ×2 IMPLANT
GLOVE BIO SURGEON STRL SZ7 (GLOVE) ×4 IMPLANT
GLOVE BIOGEL PI IND STRL 7.5 (GLOVE) ×1 IMPLANT
GLOVE BIOGEL PI INDICATOR 7.5 (GLOVE) ×1
GOWN STRL REUS W/ TWL LRG LVL3 (GOWN DISPOSABLE) ×2 IMPLANT
GOWN STRL REUS W/TWL LRG LVL3 (GOWN DISPOSABLE) ×2
KIT BASIN OR (CUSTOM PROCEDURE TRAY) ×2 IMPLANT
KIT ROOM TURNOVER OR (KITS) ×2 IMPLANT
LIQUID BAND (GAUZE/BANDAGES/DRESSINGS) ×2 IMPLANT
NEEDLE HYPO 25GX1X1/2 BEV (NEEDLE) IMPLANT
NS IRRIG 1000ML POUR BTL (IV SOLUTION) ×2 IMPLANT
PACK SURGICAL SETUP 50X90 (CUSTOM PROCEDURE TRAY) ×2 IMPLANT
PAD ARMBOARD 7.5X6 YLW CONV (MISCELLANEOUS) ×4 IMPLANT
PENCIL BUTTON HOLSTER BLD 10FT (ELECTRODE) ×2 IMPLANT
SPECIMEN JAR SMALL (MISCELLANEOUS) IMPLANT
SPONGE LAP 4X18 X RAY DECT (DISPOSABLE) ×2 IMPLANT
STRIP CLOSURE SKIN 1/2X4 (GAUZE/BANDAGES/DRESSINGS) ×2 IMPLANT
SUT MNCRL AB 4-0 PS2 18 (SUTURE) ×2 IMPLANT
SUT VIC AB 2-0 SH 27 (SUTURE)
SUT VIC AB 2-0 SH 27X BRD (SUTURE) IMPLANT
SUT VIC AB 3-0 SH 27 (SUTURE) ×1
SUT VIC AB 3-0 SH 27XBRD (SUTURE) ×1 IMPLANT
SYR CONTROL 10ML LL (SYRINGE) IMPLANT
TOWEL OR 17X24 6PK STRL BLUE (TOWEL DISPOSABLE) ×2 IMPLANT
TOWEL OR 17X26 10 PK STRL BLUE (TOWEL DISPOSABLE) ×2 IMPLANT
WATER STERILE IRR 1000ML POUR (IV SOLUTION) IMPLANT

## 2015-04-06 NOTE — H&P (View-Only) (Signed)
History of Present Illness Adrian Singh. Nga Rabon MD; 03/07/2015 1:43 PM) Patient words: scalp cyst.  The patient is a 81 year old male who presents with a complaint of Mass. This is a 81 year old male who presents with about 1 year of an enlarging mass on the back of his scalp. This has begun to protrude and causes significant tenderness and headaches. It has never been infected or had any drainage. He would like to have this area excised. He is on Xarelto for history of stroke.  Cardiology - Dr. Martinique. Other Problems Marjean Donna, CMA; 03/07/2015 11:38 AM) Arthritis Back Pain Cerebrovascular Accident Diabetes Mellitus Enlarged Prostate Gastroesophageal Reflux Disease Heart murmur High blood pressure Melanoma  Past Surgical History Marjean Donna, CMA; 03/07/2015 11:38 AM) Shoulder Surgery Left.  Diagnostic Studies History Marjean Donna, CMA; 03/07/2015 11:38 AM) Colonoscopy 5-10 years ago  Allergies Davy Pique Bynum, CMA; 03/07/2015 11:40 AM) No Known Drug Allergies 03/07/2015  Medication History (Sonya Bynum, CMA; 03/07/2015 11:40 AM) TraMADol HCl (50MG  Tablet, Oral) Active. Doxazosin Mesylate (4MG  Tablet, Oral) Active. Glimepiride (4MG  Tablet, Oral) Active. Metoprolol Succinate ER (50MG  Tablet ER 24HR, Oral) Active. Ramipril (2.5MG  Capsule, Oral) Active. Tamsulosin HCl (0.4MG  Capsule, Oral) Active. Triamterene-HCTZ (37.5-25MG  Tablet, Oral) Active. Pantoprazole Sodium (40MG  Tablet DR, Oral) Active. Finasteride (5MG  Tablet, Oral) Active. Xarelto (20MG  Tablet, Oral) Active. Medications Reconciled  Social History Marjean Donna, CMA; 03/07/2015 11:38 AM) No alcohol use No caffeine use No drug use Tobacco use Former smoker.     Review of Systems Davy Pique Bynum CMA; 03/07/2015 11:38 AM) General Not Present- Appetite Loss, Chills, Fatigue, Fever, Night Sweats, Weight Gain and Weight Loss. Skin Not Present- Change in Wart/Mole, Dryness, Hives, Jaundice, New  Lesions, Non-Healing Wounds, Rash and Ulcer. HEENT Present- Hearing Loss, Ringing in the Ears and Wears glasses/contact lenses. Not Present- Earache, Hoarseness, Nose Bleed, Oral Ulcers, Seasonal Allergies, Sinus Pain, Sore Throat, Visual Disturbances and Yellow Eyes. Respiratory Present- Snoring. Not Present- Bloody sputum, Chronic Cough, Difficulty Breathing and Wheezing. Breast Not Present- Breast Mass, Breast Pain, Nipple Discharge and Skin Changes. Cardiovascular Present- Chest Pain and Rapid Heart Rate. Not Present- Difficulty Breathing Lying Down, Leg Cramps, Palpitations, Shortness of Breath and Swelling of Extremities. Gastrointestinal Not Present- Abdominal Pain, Bloating, Bloody Stool, Change in Bowel Habits, Chronic diarrhea, Constipation, Difficulty Swallowing, Excessive gas, Gets full quickly at meals, Hemorrhoids, Indigestion, Nausea, Rectal Pain and Vomiting. Male Genitourinary Present- Nocturia. Not Present- Blood in Urine, Change in Urinary Stream, Frequency, Impotence, Painful Urination, Urgency and Urine Leakage. Musculoskeletal Present- Back Pain and Joint Pain. Not Present- Joint Stiffness, Muscle Pain, Muscle Weakness and Swelling of Extremities. Neurological Present- Weakness. Not Present- Decreased Memory, Fainting, Headaches, Numbness, Seizures, Tingling, Tremor and Trouble walking. Psychiatric Not Present- Anxiety, Bipolar, Change in Sleep Pattern, Depression, Fearful and Frequent crying. Endocrine Present- Heat Intolerance. Not Present- Cold Intolerance, Excessive Hunger, Hair Changes, Hot flashes and New Diabetes. Hematology Present- Easy Bruising. Not Present- Excessive bleeding, Gland problems, HIV and Persistent Infections.  Vitals (Sonya Bynum CMA; 03/07/2015 11:39 AM) 03/07/2015 11:38 AM Weight: 218.6 lb Height: 69in Body Surface Area: 2.2 m Body Mass Index: 32.28 kg/m Temp.: 80F(Temporal)  Pulse: 77 (Regular)  BP: 128/72 (Sitting, Left Arm,  Standard)     Physical Exam Rodman Key K. Lorenda Grecco MD; 03/07/2015 1:44 PM)  The physical exam findings are as follows: Note:WDWN in NAD Scalp - posterior - 1.5-2 cm protruding subcutaneous mass; no erythema or induration; no drainage Mildly tender to palpation. Well-circumscribed Neck: No masses, no thyromegaly Lungs:  CTA bilaterally; normal respiratory effort CV: Regular rate and rhythm; no murmurs Abd: +bowel sounds, soft, non-tender, no masses Ext: Well-perfused; no edema Skin: Warm, dry; no sign of jaundice    Assessment & Plan Rodman Key K. Lawyer Washabaugh MD; 03/07/2015 1:44 PM)  SEBACEOUS CYST (706.2  L72.3)  Current Plans Schedule for Surgery - excision of sebaceous cyst - scalp - 2 cm. The surgical procedure has been discussed with the patient. Potential risks, benefits, alternative treatments, and expected outcomes have been explained. All of the patient's questions at this time have been answered. The likelihood of reaching the patient's treatment goal is good. The patient understand the proposed surgical procedure and wishes to proceed.   We will schedule this after receiving cardiac clearance from Dr. Martinique.  Adrian Singh. Georgette Dover, MD, [facility] Surgery  General/ Trauma Surgery  03/07/2015 1:45 PM

## 2015-04-06 NOTE — Anesthesia Preprocedure Evaluation (Signed)
Anesthesia Evaluation  Patient identified by MRN, date of birth, ID band Patient awake    Reviewed: Allergy & Precautions, NPO status , Patient's Chart, lab work & pertinent test results  Airway Mallampati: II  TM Distance: >3 FB Neck ROM: Full    Dental  (+) Edentulous Upper, Edentulous Lower   Pulmonary former smoker,  breath sounds clear to auscultation        Cardiovascular hypertension, Rhythm:Regular Rate:Normal     Neuro/Psych    GI/Hepatic   Endo/Other  diabetes  Renal/GU      Musculoskeletal   Abdominal   Peds  Hematology   Anesthesia Other Findings   Reproductive/Obstetrics                             Anesthesia Physical Anesthesia Plan  ASA: III  Anesthesia Plan: General   Post-op Pain Management:    Induction: Intravenous  Airway Management Planned: Oral ETT  Additional Equipment:   Intra-op Plan:   Post-operative Plan:   Informed Consent: I have reviewed the patients History and Physical, chart, labs and discussed the procedure including the risks, benefits and alternatives for the proposed anesthesia with the patient or authorized representative who has indicated his/her understanding and acceptance.     Plan Discussed with: CRNA and Anesthesiologist  Anesthesia Plan Comments: (Scalp sebaceous cyst Type 2 DM  H/O paroxysmal afib now in SR H/O embolic CVA 0/17 presumably due to paroxysmal afib on Xarelto Renal insufficiency Cr 1.73 htn Nonobstructive CAD by cath 2009 EF 60% by echo 7/15 Plan GA with oral ETT due to prone position  Adrian Singh )        Anesthesia Quick Evaluation

## 2015-04-06 NOTE — Transfer of Care (Signed)
Immediate Anesthesia Transfer of Care Note  Patient: Adrian Singh  Procedure(s) Performed: Procedure(s): EXCISION OF SCALP CYST (N/A)  Patient Location: PACU  Anesthesia Type:General  Level of Consciousness: awake, patient cooperative and responds to stimulation  Airway & Oxygen Therapy: Patient Spontanous Breathing and Patient connected to nasal cannula oxygen  Post-op Assessment: Report given to RN and Post -op Vital signs reviewed and stable  Post vital signs: Reviewed and stable  Last Vitals:  Filed Vitals:   04/06/15 1041  BP: 142/88  Pulse: 68  Temp: 37.1 C  Resp: 20    Complications: No apparent anesthesia complications

## 2015-04-06 NOTE — Op Note (Signed)
Preop diagnosis: Sebaceous cyst of the posterior scalp Postop diagnosis: Same Procedure performed: Excision of subcutaneous sebaceous cyst of scalp 1.5 cm Surgeon:Leylanie Woodmansee K. Anesthesia: Gen. endotracheal Indications: This is a 81 year old male who presents with an enlarging tender mass on the posterior surface of the scalp. He was examined and this was felt to represent a sebaceous cyst. He presents now for excision of this area.  Description of procedure: The patient is brought to the operating room and placed in supine position on his stretcher. After an adequate level of general anesthesia was obtained he was moved to a prone position on the operating table with appropriate chest padding. The area over the mass was shaved prepped with Betadine and draped sterile fashion. A timeout was taken to ensure the proper patient and proper procedure. We infiltrated the area over the mass with 0.25% Marcaine with epinephrine. Dissection was carried down sharply through the dermis to the surface of the cyst. We bluntly dissected around this is completely. It was delivered up into the field and removed. This was sent for pathologic examination. Cautery she is for hemostasis. We closed the wound with 4-0 Monocryl. We sealed the wound with skin adhesive. This was allowed to dry. The patient was then moved back to a supine position on the stretcher. He is extubated and brought to recovery was stable condition. All sponge, initially, and needle counts are correct.  Imogene Burn. Georgette Dover, MD, [facility]  General/ Trauma Surgery  04/06/2015 11:55 AM

## 2015-04-06 NOTE — Discharge Instructions (Signed)
You may shower over this area in 24 hours. Do not scrub this area. The glue will flake off over the next couple of weeks.

## 2015-04-06 NOTE — Interval H&P Note (Signed)
History and Physical Interval Note:  04/06/2015 9:54 AM  Adrian Singh  has presented today for surgery, with the diagnosis of Scalp Cyst  The various methods of treatment have been discussed with the patient and family. After consideration of risks, benefits and other options for treatment, the patient has consented to  Procedure(s): EXCISION OF SCALP CYST (N/A) as a surgical intervention .  The patient's history has been reviewed, patient examined, no change in status, stable for surgery.  I have reviewed the patient's chart and labs.  Questions were answered to the patient's satisfaction.     Griffen Frayne K.

## 2015-04-06 NOTE — Anesthesia Postprocedure Evaluation (Signed)
  Anesthesia Post-op Note  Patient: Adrian Singh  Procedure(s) Performed: Procedure(s): EXCISION OF SCALP CYST (N/A)  Patient Location: PACU  Anesthesia Type:General  Level of Consciousness: awake, alert  and oriented  Airway and Oxygen Therapy: Patient Spontanous Breathing and Patient connected to nasal cannula oxygen  Post-op Pain: mild  Post-op Assessment: Post-op Vital signs reviewed, Patient's Cardiovascular Status Stable, Respiratory Function Stable, Patent Airway and Pain level controlled              Post-op Vital Signs: stable  Last Vitals:  Filed Vitals:   04/06/15 1222  BP: 165/92  Pulse: 69  Temp:   Resp: 16    Complications: No apparent anesthesia complications

## 2015-04-07 ENCOUNTER — Encounter (HOSPITAL_COMMUNITY): Payer: Self-pay | Admitting: Surgery

## 2015-04-26 ENCOUNTER — Encounter: Payer: Self-pay | Admitting: Gastroenterology

## 2015-04-28 ENCOUNTER — Other Ambulatory Visit: Payer: Self-pay | Admitting: Internal Medicine

## 2015-04-28 NOTE — Telephone Encounter (Signed)
rx sent to pharmacy by e-script  

## 2015-04-28 NOTE — Telephone Encounter (Signed)
Approved: okay for a year 

## 2015-04-28 NOTE — Telephone Encounter (Signed)
Ok to fill 

## 2015-05-09 ENCOUNTER — Other Ambulatory Visit: Payer: Self-pay | Admitting: Internal Medicine

## 2015-05-09 NOTE — Telephone Encounter (Signed)
Approved: okay #[phone] 

## 2015-05-09 NOTE — Telephone Encounter (Signed)
03/22/15 

## 2015-05-09 NOTE — Telephone Encounter (Signed)
rx called into pharmacy

## 2015-05-24 ENCOUNTER — Ambulatory Visit (INDEPENDENT_AMBULATORY_CARE_PROVIDER_SITE_OTHER): Payer: Medicare Other

## 2015-05-24 DIAGNOSIS — Z23 Encounter for immunization: Secondary | ICD-10-CM

## 2015-05-27 ENCOUNTER — Other Ambulatory Visit: Payer: Self-pay | Admitting: Internal Medicine

## 2015-06-09 ENCOUNTER — Other Ambulatory Visit: Payer: Self-pay | Admitting: Internal Medicine

## 2015-06-17 ENCOUNTER — Other Ambulatory Visit: Payer: Self-pay | Admitting: Internal Medicine

## 2015-06-17 NOTE — Telephone Encounter (Signed)
Approved: 30 x 0 

## 2015-06-17 NOTE — Telephone Encounter (Signed)
rx called into pharmacy

## 2015-06-17 NOTE — Telephone Encounter (Signed)
05/09/2015 

## 2015-06-23 ENCOUNTER — Ambulatory Visit: Payer: Medicare Other

## 2015-07-08 ENCOUNTER — Other Ambulatory Visit: Payer: Self-pay | Admitting: Internal Medicine

## 2015-07-13 ENCOUNTER — Other Ambulatory Visit: Payer: Self-pay | Admitting: Internal Medicine

## 2015-07-13 NOTE — Telephone Encounter (Signed)
06/17/2015 

## 2015-07-13 NOTE — Telephone Encounter (Signed)
Approved: 30 x 0 

## 2015-07-13 NOTE — Telephone Encounter (Signed)
rx called into pharmacy

## 2015-07-14 ENCOUNTER — Telehealth: Payer: Self-pay | Admitting: Cardiology

## 2015-07-14 NOTE — Telephone Encounter (Signed)
Returned call to patient office out of Xarelto 20 mg samples. 

## 2015-07-14 NOTE — Telephone Encounter (Signed)
Pt would like some samples of Xarelto please. °

## 2015-07-18 ENCOUNTER — Telehealth: Payer: Self-pay | Admitting: Cardiology

## 2015-07-18 NOTE — Telephone Encounter (Signed)
Patient calling the office for samples of medication:   1.  What medication and dosage are you requesting samples for? Xarelto 20mg    2.  Are you currently out of this medication? Yes , he is in the donut hole  3. Are you requesting samples to get you through until you receive your prescription? No  He would like enough to get him through to January

## 2015-07-18 NOTE — Telephone Encounter (Signed)
Called patient to notify him that we did not have any sample medications available at this time and for him to call back later in the week.

## 2015-08-10 ENCOUNTER — Encounter: Payer: Self-pay | Admitting: Cardiology

## 2015-08-10 ENCOUNTER — Ambulatory Visit (INDEPENDENT_AMBULATORY_CARE_PROVIDER_SITE_OTHER): Payer: Medicare Other | Admitting: Cardiology

## 2015-08-10 VITALS — BP 118/74 | HR 81 | Ht 70.0 in | Wt 218.0 lb

## 2015-08-10 DIAGNOSIS — I2510 Atherosclerotic heart disease of native coronary artery without angina pectoris: Secondary | ICD-10-CM

## 2015-08-10 DIAGNOSIS — I10 Essential (primary) hypertension: Secondary | ICD-10-CM

## 2015-08-10 DIAGNOSIS — Z7901 Long term (current) use of anticoagulants: Secondary | ICD-10-CM

## 2015-08-10 DIAGNOSIS — I480 Paroxysmal atrial fibrillation: Secondary | ICD-10-CM

## 2015-08-10 NOTE — Progress Notes (Signed)
Adrian Singh Date of Birth: Aug 22, 1934   History of Present Illness: Adrian Singh is seen today for follow up CAD.  He has a history of nonobstructive coronary disease by cardiac catheterization 2009. He also has a history of orthostatic dizziness. He was admitted in July [id number] with an embolic stroke presumably due to paroxysmal Afib. He was anticoagulated with Eliquis. The patient developed tachycardia and HA on Eliquis that resolved when he was switched to Xarelto. Previous orthostatic dizziness improved with stopping diuretic and alpha blockers he was taking for enlarged prostate.  On follow up today he complains of SOB with exertion when walking over 200 ft. No chest pain. Still has some mild dizziness when he bends over and stands up.   Current Outpatient Prescriptions on File Prior to Visit  Medication Sig Dispense Refill  . B Complex Vitamins (VITAMIN B COMPLEX) TABS Take 1 tablet by mouth 3 (three) times a week.     Marland Kitchen BAYER CONTOUR NEXT TEST test strip CHECK BLOOD SUGAR 1-2 TIMES DAILY AND AS INSTRUCTED. DX CODE 250.00. (Patient taking differently: Check blood sugar 2 times weekly) 100 each 2  . Cholecalciferol (VITAMIN D) 2000 UNITS CAPS Take 2,000 Units by mouth daily.     . finasteride (PROSCAR) 5 MG tablet TAKE 1 TABLET (5 MG TOTAL) BY MOUTH DAILY. 90 tablet 3  . glimepiride (AMARYL) 4 MG tablet TAKE 1 TABLET WITH BREAKFAST OR FIRST MAIN MEAL OF DAY (Patient taking differently: Take 4 mg by mouth with breakfast or first main meal of day) 90 tablet 3  . LANCETS ULTRA THIN MISC Used to test blood sugar 1-2 times daily dx: 250.00 (Patient taking differently: 1 each by Other route See admin instructions. Check blood sugar twice weekly) 100 each 0  . metFORMIN (GLUCOPHAGE-XR) 500 MG 24 hr tablet TAKE 2 TABLETS BY MOUTH DAILY WITH BREAKFAST. 180 tablet 1  . metoprolol succinate (TOPROL-XL) 50 MG 24 hr tablet TAKE 1 TABLET EVERY DAY 90 tablet 3  . pantoprazole (PROTONIX) 40 MG tablet TAKE 1  TABLET BY MOUTH EVERY DAY (Patient taking differently: Take 40mg  by mouth every day) 90 tablet 3  . ramipril (ALTACE) 2.5 MG capsule TAKE 1 CAPSULE (2.5 MG TOTAL) BY MOUTH DAILY. 90 capsule 3  . traMADol (ULTRAM) 50 MG tablet TAKE 1 TABLET BY MOUTH 3 TIMES DAILY AS NEEDED FOR PAIN 30 tablet 0  . triamterene-hydrochlorothiazide (MAXZIDE-25) 37.5-25 MG tablet TAKE 1 TABLET BY MOUTH EVERY DAY 90 tablet 3  . vitamin B-12 (CYANOCOBALAMIN) 1000 MCG tablet Take 1,000 mcg by mouth daily.    [name] 20 MG TABS tablet TAKE 1 TABLET (20 MG TOTAL) BY MOUTH DAILY WITH SUPPER. 30 tablet 5   No current facility-administered medications on file prior to visit.    Allergies  Allergen Reactions  . Doxazosin Other (See Comments)    dizziness    Past Medical History  Diagnosis Date  . Hypertrophy of prostate without urinary obstruction and other lower urinary tract symptoms (LUTS)   . Type II or unspecified type diabetes mellitus without mention of complication, not stated as uncontrolled   . Esophageal reflux   . Unspecified essential hypertension   . Osteoarthrosis, unspecified whether generalized or localized, unspecified site   . Plantar wart   . Tachycardia, unspecified   . Orthostatic lightheadedness   . Atrial fibrillation ([place])     x1  . CAD (coronary artery disease)     nonobstructive  . Chronic kidney disease, stage III (  moderate)   . History of CVA (cerebrovascular accident)   . Thrombocytopenia ([place])   . Shortness of breath dyspnea     with exertion  . Stroke [facility])     " mini"  . Headache   . 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 Esophagogastroduodenoscopy  2001    with dilation  . Back surgery  2001  . Rotator cuff repair  2005    Left  . Cardiac catheterization    . Colonoscopy w/ biopsies and polypectomy    . Ear cyst excision N/A 04/06/2015    Procedure: EXCISION OF SCALP CYST;  Surgeon: Donnie Mesa, MD;   Location: [place];  Service: General;  Laterality: N/A;    History  Smoking status  . Former Smoker  Smokeless tobacco  . Never Used    Comment: " Quit by age 13 "    History  Alcohol Use No    Family History  Problem Relation Age of Onset  . Stroke Father   . Peripheral vascular disease Father     amputation  . Heart failure Mother     CHF  . Coronary artery disease Mother   . Heart attack Mother     Multiple  . COPD Brother   . Heart disease Brother   . Cancer Brother     ?  . Lung cancer Sister     Review of Systems: The review of systems is positive for orthostatic lightheadedness.  All other systems were reviewed and are negative.  Physical Exam: BP 118/74 mmHg  Pulse 81  Ht 5\' 10"  (1.778 m)  Wt 98.884 kg (218 lb)  BMI 31.28 kg/m2 He is a pleasant white male in no acute distress.  The HEENT exam is normal.  The carotids are 2+ without bruits.  There is no thyromegaly.  There is no JVD.  The lungs are clear.    The heart exam reveals a regular rate with a normal S1 and S2.  There are no murmurs, gallops, or rubs.  The PMI is not displaced.   Abdominal exam reveals good bowel sounds.    There is no hepatosplenomegaly or tenderness.  There are no masses.  Exam of the legs reveal no clubbing, cyanosis, or edema. The distal pulses are intact.  Cranial nerves II - XII are intact.  Motor and sensory functions are intact.  The gait is normal.  LABORATORY DATA: Echo 04/01/14: Study Conclusions  - Left ventricle: The cavity size was normal. Wall thickness was increased in a pattern of mild LVH. Systolic function was vigorous. The estimated ejection fraction was in the range of 65% to 70%. Doppler parameters are consistent with abnormal left ventricular relaxation (grade 1 diastolic dysfunction).  Ecg today shows NSR with rate 81. LAD. Findings consistent with old infero-posterior infarct. I have personally reviewed and interpreted this study.    Assessment / Plan: 1.  Orthostatic dizziness. Improved. Would try to stay off alpha blockers completely.  2. Hypertension, controlled.  3. Nonobstructive coronary disease by cath in 2009. Now with symptoms of DOE which may be anginal equivalent symptoms. With his multiple risk factors will arrange a stress Myoview.   4. Paroxysmal Afib. Continue metoprolol and Xarelto.  5. History of CVA.

## 2015-08-10 NOTE — Patient Instructions (Signed)
We will schedule you for a stress test  Continue your current therapy   

## 2015-08-23 ENCOUNTER — Telehealth (HOSPITAL_COMMUNITY): Payer: Self-pay

## 2015-08-23 NOTE — Telephone Encounter (Signed)
Encounter complete. 

## 2015-08-25 ENCOUNTER — Ambulatory Visit (HOSPITAL_COMMUNITY)
Admission: RE | Admit: 2015-08-25 | Discharge: 2015-08-25 | Disposition: A | Discharge status: Home / Self Care | Payer: Medicare Other | Source: Ambulatory Visit | Attending: Cardiology | Admitting: Cardiology

## 2015-08-25 DIAGNOSIS — Z8249 Family history of ischemic heart disease and other diseases of the circulatory system: Secondary | ICD-10-CM | POA: Diagnosis not present

## 2015-08-25 DIAGNOSIS — R9439 Abnormal result of other cardiovascular function study: Secondary | ICD-10-CM | POA: Diagnosis not present

## 2015-08-25 DIAGNOSIS — R0609 Other forms of dyspnea: Secondary | ICD-10-CM | POA: Insufficient documentation

## 2015-08-25 DIAGNOSIS — Z7901 Long term (current) use of anticoagulants: Secondary | ICD-10-CM | POA: Insufficient documentation

## 2015-08-25 DIAGNOSIS — Z6831 Body mass index (BMI) 31.0-31.9, adult: Secondary | ICD-10-CM | POA: Diagnosis not present

## 2015-08-25 DIAGNOSIS — E119 Type 2 diabetes mellitus without complications: Secondary | ICD-10-CM | POA: Insufficient documentation

## 2015-08-25 DIAGNOSIS — I739 Peripheral vascular disease, unspecified: Secondary | ICD-10-CM | POA: Insufficient documentation

## 2015-08-25 DIAGNOSIS — R42 Dizziness and giddiness: Secondary | ICD-10-CM | POA: Insufficient documentation

## 2015-08-25 DIAGNOSIS — I480 Paroxysmal atrial fibrillation: Secondary | ICD-10-CM | POA: Insufficient documentation

## 2015-08-25 DIAGNOSIS — Z87891 Personal history of nicotine dependence: Secondary | ICD-10-CM | POA: Diagnosis not present

## 2015-08-25 DIAGNOSIS — E663 Overweight: Secondary | ICD-10-CM | POA: Diagnosis not present

## 2015-08-25 DIAGNOSIS — I10 Essential (primary) hypertension: Secondary | ICD-10-CM | POA: Insufficient documentation

## 2015-08-25 DIAGNOSIS — R079 Chest pain, unspecified: Secondary | ICD-10-CM | POA: Insufficient documentation

## 2015-08-25 DIAGNOSIS — R5383 Other fatigue: Secondary | ICD-10-CM | POA: Diagnosis not present

## 2015-08-25 DIAGNOSIS — I2510 Atherosclerotic heart disease of native coronary artery without angina pectoris: Secondary | ICD-10-CM

## 2015-08-25 LAB — MYOCARDIAL PERFUSION IMAGING
Estimated workload: 7 METS
Exercise duration (min): 5 min
Exercise duration (sec): 15 s
LV dias vol: 81 mL
LV sys vol: 39 mL
MPHR: 141 {beats}/min
Peak HR: 134 {beats}/min
Percent HR: 95 %
RPE: 17
Rest HR: 82 {beats}/min
SDS: 0
SRS: 16
SSS: 16
TID: 0.9

## 2015-08-25 MED ORDER — TECHNETIUM TC 99M SESTAMIBI GENERIC - CARDIOLITE
10.6000 | Freq: Once | INTRAVENOUS | Status: AC | PRN
  Administered 2015-08-25: 10.6 via INTRAVENOUS

## 2015-08-25 MED ORDER — TECHNETIUM TC 99M SESTAMIBI GENERIC - CARDIOLITE
30.9000 | Freq: Once | INTRAVENOUS | Status: AC | PRN
  Administered 2015-08-25: 30.9 via INTRAVENOUS

## 2015-09-01 ENCOUNTER — Other Ambulatory Visit: Payer: Self-pay

## 2015-09-01 ENCOUNTER — Other Ambulatory Visit: Payer: Self-pay | Admitting: Cardiology

## 2015-09-01 ENCOUNTER — Other Ambulatory Visit: Payer: Self-pay | Admitting: Internal Medicine

## 2015-09-01 DIAGNOSIS — I10 Essential (primary) hypertension: Secondary | ICD-10-CM

## 2015-09-01 DIAGNOSIS — I2510 Atherosclerotic heart disease of native coronary artery without angina pectoris: Secondary | ICD-10-CM

## 2015-09-12 ENCOUNTER — Other Ambulatory Visit: Payer: Self-pay | Admitting: Internal Medicine

## 2015-09-13 NOTE — Telephone Encounter (Signed)
Approved: 30 x 0 

## 2015-09-13 NOTE — Telephone Encounter (Signed)
07/13/2015 

## 2015-09-13 NOTE — Telephone Encounter (Signed)
rx called into pharmacy

## 2015-09-21 LAB — CBC WITH DIFFERENTIAL/PLATELET
Basophils Absolute: 0.1 10*3/uL (ref 0.0–0.1)
Basophils Relative: 1 % (ref 0–1)
Eosinophils Absolute: 0.1 10*3/uL (ref 0.0–0.7)
Eosinophils Relative: 1 % (ref 0–5)
HCT: 39.9 % (ref 39.0–52.0)
Hemoglobin: 12.9 g/dL — ABNORMAL LOW (ref 13.0–17.0)
Lymphocytes Relative: 24 % (ref 12–46)
Lymphs Abs: 1.4 10*3/uL (ref 0.7–4.0)
MCH: 28.7 pg (ref 26.0–34.0)
MCHC: 32.3 g/dL (ref 30.0–36.0)
MCV: 88.9 fL (ref 78.0–100.0)
MPV: 13.3 fL — ABNORMAL HIGH (ref 8.6–12.4)
Monocytes Absolute: 0.6 10*3/uL (ref 0.1–1.0)
Monocytes Relative: 10 % (ref 3–12)
Neutro Abs: 3.8 10*3/uL (ref 1.7–7.7)
Neutrophils Relative %: 64 % (ref 43–77)
Platelets: 160 10*3/uL (ref 150–400)
RBC: 4.49 MIL/uL (ref 4.22–5.81)
RDW: 13.3 % (ref 11.5–15.5)
WBC: 6 10*3/uL (ref 4.0–10.5)

## 2015-09-21 LAB — PROTIME-INR
INR: 1.48 (ref <1.50)
Prothrombin Time: 18.1 seconds — ABNORMAL HIGH (ref 11.6–15.2)

## 2015-09-21 LAB — BASIC METABOLIC PANEL
BUN: 26 mg/dL — ABNORMAL HIGH (ref 7–25)
CO2: 24 mmol/L (ref 20–31)
Calcium: 9.6 mg/dL (ref 8.6–10.3)
Chloride: 100 mmol/L (ref 98–110)
Creat: 1.61 mg/dL — ABNORMAL HIGH (ref 0.70–1.18)
Glucose, Bld: 269 mg/dL — ABNORMAL HIGH (ref 65–99)
Potassium: 5.1 mmol/L (ref 3.5–5.3)
Sodium: 135 mmol/L (ref 135–146)

## 2015-09-22 ENCOUNTER — Encounter (HOSPITAL_COMMUNITY)
Admission: RE | Disposition: A | Discharge status: Home / Self Care | Payer: Self-pay | Source: Ambulatory Visit | Attending: Cardiology

## 2015-09-22 ENCOUNTER — Other Ambulatory Visit: Payer: Self-pay | Admitting: Physician Assistant

## 2015-09-22 ENCOUNTER — Ambulatory Visit (HOSPITAL_COMMUNITY)
Admission: RE | Admit: 2015-09-22 | Discharge: 2015-09-22 | Disposition: A | Discharge status: Home / Self Care | Payer: Medicare Other | Source: Ambulatory Visit | Attending: Cardiology | Admitting: Cardiology

## 2015-09-22 ENCOUNTER — Encounter (HOSPITAL_COMMUNITY): Payer: Self-pay | Admitting: Cardiology

## 2015-09-22 DIAGNOSIS — Z7984 Long term (current) use of oral hypoglycemic drugs: Secondary | ICD-10-CM | POA: Insufficient documentation

## 2015-09-22 DIAGNOSIS — I951 Orthostatic hypotension: Secondary | ICD-10-CM | POA: Insufficient documentation

## 2015-09-22 DIAGNOSIS — Z8249 Family history of ischemic heart disease and other diseases of the circulatory system: Secondary | ICD-10-CM | POA: Insufficient documentation

## 2015-09-22 DIAGNOSIS — N189 Chronic kidney disease, unspecified: Secondary | ICD-10-CM

## 2015-09-22 DIAGNOSIS — Z87891 Personal history of nicotine dependence: Secondary | ICD-10-CM | POA: Insufficient documentation

## 2015-09-22 DIAGNOSIS — Z85828 Personal history of other malignant neoplasm of skin: Secondary | ICD-10-CM | POA: Insufficient documentation

## 2015-09-22 DIAGNOSIS — R0609 Other forms of dyspnea: Secondary | ICD-10-CM | POA: Diagnosis present

## 2015-09-22 DIAGNOSIS — R42 Dizziness and giddiness: Secondary | ICD-10-CM | POA: Insufficient documentation

## 2015-09-22 DIAGNOSIS — R0600 Dyspnea, unspecified: Secondary | ICD-10-CM | POA: Diagnosis present

## 2015-09-22 DIAGNOSIS — N183 Chronic kidney disease, stage 3 (moderate): Secondary | ICD-10-CM | POA: Diagnosis not present

## 2015-09-22 DIAGNOSIS — K219 Gastro-esophageal reflux disease without esophagitis: Secondary | ICD-10-CM | POA: Insufficient documentation

## 2015-09-22 DIAGNOSIS — E1142 Type 2 diabetes mellitus with diabetic polyneuropathy: Secondary | ICD-10-CM | POA: Diagnosis present

## 2015-09-22 DIAGNOSIS — I2510 Atherosclerotic heart disease of native coronary artery without angina pectoris: Secondary | ICD-10-CM

## 2015-09-22 DIAGNOSIS — Z7901 Long term (current) use of anticoagulants: Secondary | ICD-10-CM | POA: Insufficient documentation

## 2015-09-22 DIAGNOSIS — I129 Hypertensive chronic kidney disease with stage 1 through stage 4 chronic kidney disease, or unspecified chronic kidney disease: Secondary | ICD-10-CM | POA: Diagnosis not present

## 2015-09-22 DIAGNOSIS — Z8673 Personal history of transient ischemic attack (TIA), and cerebral infarction without residual deficits: Secondary | ICD-10-CM | POA: Diagnosis not present

## 2015-09-22 DIAGNOSIS — IMO0002 Reserved for concepts with insufficient information to code with codable children: Secondary | ICD-10-CM | POA: Diagnosis present

## 2015-09-22 DIAGNOSIS — N400 Enlarged prostate without lower urinary tract symptoms: Secondary | ICD-10-CM | POA: Insufficient documentation

## 2015-09-22 DIAGNOSIS — I480 Paroxysmal atrial fibrillation: Secondary | ICD-10-CM | POA: Diagnosis present

## 2015-09-22 DIAGNOSIS — E1122 Type 2 diabetes mellitus with diabetic chronic kidney disease: Secondary | ICD-10-CM | POA: Insufficient documentation

## 2015-09-22 DIAGNOSIS — I10 Essential (primary) hypertension: Secondary | ICD-10-CM | POA: Diagnosis present

## 2015-09-22 DIAGNOSIS — E1140 Type 2 diabetes mellitus with diabetic neuropathy, unspecified: Secondary | ICD-10-CM | POA: Diagnosis present

## 2015-09-22 DIAGNOSIS — E1165 Type 2 diabetes mellitus with hyperglycemia: Secondary | ICD-10-CM | POA: Diagnosis present

## 2015-09-22 DIAGNOSIS — R9439 Abnormal result of other cardiovascular function study: Secondary | ICD-10-CM | POA: Diagnosis present

## 2015-09-22 HISTORY — PX: CARDIAC CATHETERIZATION: SHX172

## 2015-09-22 LAB — GLUCOSE, CAPILLARY: Glucose-Capillary: 222 mg/dL — ABNORMAL HIGH (ref 65–99)

## 2015-09-22 LAB — BASIC METABOLIC PANEL
Anion gap: 8 (ref 5–15)
BUN: 21 mg/dL — ABNORMAL HIGH (ref 6–20)
CO2: 24 mmol/L (ref 22–32)
Calcium: 9.3 mg/dL (ref 8.9–10.3)
Chloride: 108 mmol/L (ref 101–111)
Creatinine, Ser: 1.38 mg/dL — ABNORMAL HIGH (ref 0.61–1.24)
GFR calc Af Amer: 55 mL/min — ABNORMAL LOW (ref >60)
GFR calc non Af Amer: 47 mL/min — ABNORMAL LOW (ref >60)
Glucose, Bld: 219 mg/dL — ABNORMAL HIGH (ref 65–99)
Potassium: 4.4 mmol/L (ref 3.5–5.1)
Sodium: 140 mmol/L (ref 135–145)

## 2015-09-22 SURGERY — LEFT HEART CATH AND CORONARY ANGIOGRAPHY
Anesthesia: LOCAL

## 2015-09-22 MED ORDER — LIDOCAINE HCL (PF) 1 % IJ SOLN
INTRAMUSCULAR | Status: AC
  Filled 2015-09-22: qty 30

## 2015-09-22 MED ORDER — HEPARIN SODIUM (PORCINE) 1000 UNIT/ML IJ SOLN
INTRAMUSCULAR | Status: DC | PRN
  Administered 2015-09-22: 5000 [IU] via INTRAVENOUS

## 2015-09-22 MED ORDER — SODIUM CHLORIDE 0.9 % WEIGHT BASED INFUSION
1.0000 mL/kg/h | INTRAVENOUS | Status: DC
  Administered 2015-09-22: 1 mL/kg/h via INTRAVENOUS

## 2015-09-22 MED ORDER — VERAPAMIL HCL 2.5 MG/ML IV SOLN
INTRAVENOUS | Status: DC | PRN
  Administered 2015-09-22: 09:00:00 via INTRA_ARTERIAL

## 2015-09-22 MED ORDER — MIDAZOLAM HCL 2 MG/2ML IJ SOLN
INTRAMUSCULAR | Status: AC
  Filled 2015-09-22: qty 2

## 2015-09-22 MED ORDER — FENTANYL CITRATE (PF) 100 MCG/2ML IJ SOLN
INTRAMUSCULAR | Status: DC | PRN
  Administered 2015-09-22: 25 ug via INTRAVENOUS

## 2015-09-22 MED ORDER — VERAPAMIL HCL 2.5 MG/ML IV SOLN
INTRAVENOUS | Status: AC
  Filled 2015-09-22: qty 2

## 2015-09-22 MED ORDER — ASPIRIN 81 MG PO CHEW
81.0000 mg | CHEWABLE_TABLET | ORAL | Status: AC
  Administered 2015-09-22: 81 mg via ORAL

## 2015-09-22 MED ORDER — SODIUM CHLORIDE 0.9 % IJ SOLN
3.0000 mL | Freq: Two times a day (BID) | INTRAMUSCULAR | Status: DC
Start: 2015-09-22 — End: 2015-09-22

## 2015-09-22 MED ORDER — ASPIRIN 81 MG PO CHEW
CHEWABLE_TABLET | ORAL | Status: AC
  Filled 2015-09-22: qty 1

## 2015-09-22 MED ORDER — MIDAZOLAM HCL 2 MG/2ML IJ SOLN
INTRAMUSCULAR | Status: DC | PRN
  Administered 2015-09-22: 1 mg via INTRAVENOUS

## 2015-09-22 MED ORDER — FENTANYL CITRATE (PF) 100 MCG/2ML IJ SOLN
INTRAMUSCULAR | Status: AC
  Filled 2015-09-22: qty 2

## 2015-09-22 MED ORDER — SODIUM CHLORIDE 0.9 % IJ SOLN: 3.0000 mL | Freq: Two times a day (BID) | INTRAMUSCULAR | Status: DC

## 2015-09-22 MED ORDER — SODIUM CHLORIDE 0.9 % IV SOLN: 250.0000 mL | INTRAVENOUS | Status: DC | PRN

## 2015-09-22 MED ORDER — SODIUM CHLORIDE 0.9 % WEIGHT BASED INFUSION: 3.0000 mL/kg/h | INTRAVENOUS | Status: DC

## 2015-09-22 MED ORDER — SODIUM CHLORIDE 0.9 % WEIGHT BASED INFUSION
3.0000 mL/kg/h | INTRAVENOUS | Status: DC
  Administered 2015-09-22: 3 mL/kg/h via INTRAVENOUS

## 2015-09-22 MED ORDER — HEPARIN SODIUM (PORCINE) 1000 UNIT/ML IJ SOLN
INTRAMUSCULAR | Status: AC
  Filled 2015-09-22: qty 1

## 2015-09-22 MED ORDER — SODIUM CHLORIDE 0.9 % IJ SOLN: 3.0000 mL | INTRAMUSCULAR | Status: DC | PRN

## 2015-09-22 MED ORDER — IOHEXOL 350 MG/ML SOLN
INTRAVENOUS | Status: DC | PRN
  Administered 2015-09-22: 70 mL via INTRA_ARTERIAL

## 2015-09-22 MED ORDER — HEPARIN (PORCINE) IN NACL 2-0.9 UNIT/ML-% IJ SOLN
INTRAMUSCULAR | Status: AC
Start: 2015-09-22 — End: 2015-09-22
  Filled 2015-09-22: qty 1000

## 2015-09-22 MED ORDER — LIDOCAINE HCL (PF) 1 % IJ SOLN
INTRAMUSCULAR | Status: DC | PRN
  Administered 2015-09-22: 10:00:00

## 2015-09-22 SURGICAL SUPPLY — 16 items
CATH INFINITI 5 FR JL3.5 (CATHETERS) ×2 IMPLANT
CATH INFINITI JR4 5F (CATHETERS) ×2 IMPLANT
CATH LAUNCHER 5F JL3 (CATHETERS) ×1 IMPLANT
CATH LAUNCHER 5F RADL (CATHETERS) ×1 IMPLANT
CATH LAUNCHER 5F RADR (CATHETERS) ×1 IMPLANT
CATH OPTITORQUE JACKY 4.0 5F (CATHETERS) ×2 IMPLANT
CATHETER LAUNCHER 5F JL3 (CATHETERS) ×2
CATHETER LAUNCHER 5F RADL (CATHETERS) ×2
CATHETER LAUNCHER 5F RADR (CATHETERS) ×2
DEVICE RAD COMP TR BAND LRG (VASCULAR PRODUCTS) ×2 IMPLANT
GLIDESHEATH SLEND SS 6F .021 (SHEATH) ×2 IMPLANT
KIT HEART LEFT (KITS) ×2 IMPLANT
PACK CARDIAC CATHETERIZATION (CUSTOM PROCEDURE TRAY) ×2 IMPLANT
TRANSDUCER W/STOPCOCK (MISCELLANEOUS) ×2 IMPLANT
TUBING CIL FLEX 10 FLL-RA (TUBING) ×2 IMPLANT
WIRE SAFE-T 1.5MM-J .035X260CM (WIRE) ×2 IMPLANT

## 2015-09-22 NOTE — H&P (Signed)
Adrian Singh Date of Birth: Aug 24, 1934   History of Present Illness: Adrian Singh is seen today for follow up CAD. He has a history of nonobstructive coronary disease by cardiac catheterization 2009. He also has a history of orthostatic dizziness. He was admitted in July 123456 with an embolic stroke presumably due to paroxysmal Afib. He was anticoagulated with Eliquis. The patient developed tachycardia and HA on Eliquis that resolved when he was switched to Xarelto. Previous orthostatic dizziness improved with stopping diuretic and alpha blockers he was taking for enlarged prostate.  On follow up  he complains of SOB with exertion when walking over 200 ft. No chest pain. Still has some mild dizziness when he bends over and stands up. To further evaluate his dyspnea he underwent a Nuclear stress test that was abnormal. EF 52% with large inferolateral defect consistent with infarct. This was a new finding compared to 2005. Given these results cardiac cath was recommended.   Current Outpatient Prescriptions on File Prior to Visit  Medication Sig Dispense Refill  . B Complex Vitamins (VITAMIN B COMPLEX) TABS Take 1 tablet by mouth 3 (three) times a week.     Marland Kitchen BAYER CONTOUR NEXT TEST test strip CHECK BLOOD SUGAR 1-2 TIMES DAILY AND AS INSTRUCTED. DX CODE 250.00. (Patient taking differently: Check blood sugar 2 times weekly) 100 each 2  . Cholecalciferol (VITAMIN D) 2000 UNITS CAPS Take 2,000 Units by mouth daily.     . finasteride (PROSCAR) 5 MG tablet TAKE 1 TABLET (5 MG TOTAL) BY MOUTH DAILY. 90 tablet 3  . glimepiride (AMARYL) 4 MG tablet TAKE 1 TABLET WITH BREAKFAST OR FIRST MAIN MEAL OF DAY (Patient taking differently: Take 4 mg by mouth with breakfast or first main meal of day) 90 tablet 3  . LANCETS ULTRA THIN MISC Used to test blood sugar 1-2 times daily dx: 250.00 (Patient taking differently: 1 each by Other route See admin instructions. Check blood sugar twice  weekly) 100 each 0  . metFORMIN (GLUCOPHAGE-XR) 500 MG 24 hr tablet TAKE 2 TABLETS BY MOUTH DAILY WITH BREAKFAST. 180 tablet 1  . metoprolol succinate (TOPROL-XL) 50 MG 24 hr tablet TAKE 1 TABLET EVERY DAY 90 tablet 3  . pantoprazole (PROTONIX) 40 MG tablet TAKE 1 TABLET BY MOUTH EVERY DAY (Patient taking differently: Take 40mg  by mouth every day) 90 tablet 3  . ramipril (ALTACE) 2.5 MG capsule TAKE 1 CAPSULE (2.5 MG TOTAL) BY MOUTH DAILY. 90 capsule 3  . traMADol (ULTRAM) 50 MG tablet TAKE 1 TABLET BY MOUTH 3 TIMES DAILY AS NEEDED FOR PAIN 30 tablet 0  . triamterene-hydrochlorothiazide (MAXZIDE-25) 37.5-25 MG tablet TAKE 1 TABLET BY MOUTH EVERY DAY 90 tablet 3  . vitamin B-12 (CYANOCOBALAMIN) 1000 MCG tablet Take 1,000 mcg by mouth daily.    [name] 20 MG TABS tablet TAKE 1 TABLET (20 MG TOTAL) BY MOUTH DAILY WITH SUPPER. 30 tablet 5   No current facility-administered medications on file prior to visit.    Allergies  Allergen Reactions  . Doxazosin Other (See Comments)    dizziness    Past Medical History  Diagnosis Date  . Hypertrophy of prostate without urinary obstruction and other lower urinary tract symptoms (LUTS)   . Type II or unspecified type diabetes mellitus without mention of complication, not stated as uncontrolled   . Esophageal reflux   . Unspecified essential hypertension   . Osteoarthrosis, unspecified whether generalized or localized, unspecified site   . Plantar wart   .  Tachycardia, unspecified   . Orthostatic lightheadedness   . Atrial fibrillation ([place])     x1  . CAD (coronary artery disease)     nonobstructive  . Chronic kidney disease, stage III (moderate)   . History of CVA (cerebrovascular accident)   . Thrombocytopenia ([place])   . Shortness of breath dyspnea     with exertion  . Stroke [facility])     " mini"  . Headache   . 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 Esophagogastroduodenoscopy  2001    with dilation  . Back surgery  2001  . Rotator cuff repair  2005    Left  . Cardiac catheterization    . Colonoscopy w/ biopsies and polypectomy    . Ear cyst excision N/A 04/06/2015    Procedure: EXCISION OF SCALP CYST; Surgeon: Donnie Mesa, MD; Location: [place]; Service: General; Laterality: N/A;    History  Smoking status  . Former Smoker  Smokeless tobacco  . Never Used    Comment: " Quit by age 18 "    History  Alcohol Use No    Family History  Problem Relation Age of Onset  . Stroke Father   . Peripheral vascular disease Father     amputation  . Heart failure Mother     CHF  . Coronary artery disease Mother   . Heart attack Mother     Multiple  . COPD Brother   . Heart disease Brother   . Cancer Brother     ?  . Lung cancer Sister     Review of Systems: The review of systems is positive for orthostatic lightheadedness. All other systems were reviewed and are negative.  Physical Exam: BP 118/74 mmHg  Pulse 81  Ht 5\' 10"  (1.778 m)  Wt 98.884 kg (218 lb)  BMI 31.28 kg/m2 He is a pleasant white male in no acute distress. The HEENT exam is normal. The carotids are 2+ without bruits. There is no thyromegaly. There is no JVD. The lungs are clear. The heart exam reveals a regular rate with a normal S1 and S2. There are no murmurs, gallops, or rubs. The PMI is not displaced. Abdominal exam reveals good bowel sounds. There is no hepatosplenomegaly or tenderness. There are no masses. Exam of the legs reveal no clubbing, cyanosis, or edema. The distal pulses are intact. Cranial nerves II - XII are intact. Motor and sensory functions are intact. The gait is normal.  LABORATORY DATA: Echo 04/01/14: Study  Conclusions  - Left ventricle: The cavity size was normal. Wall thickness was increased in a pattern of mild LVH. Systolic function was vigorous. The estimated ejection fraction was in the range of 65% to 70%. Doppler parameters are consistent with abnormal left ventricular relaxation (grade 1 diastolic dysfunction).  Ecg today shows NSR with rate 81. LAD. Findings consistent with old infero-posterior infarct. I have personally reviewed and interpreted this study.  Study Highlights     The left ventricular ejection fraction is mildly decreased (45-54%).  Nuclear stress EF: 52%.  There was no ST segment deviation noted during stress.  This is a low risk study.  Findings consistent with prior myocardial infarction with peri-infarct ischemia.  Low risk stress nuclear study with a large, severe, partially reversible inferior lateral defect consistent with prior infarct and mild peri-infarct ischemia; EF 52 with inferior lateral hypokinesis.  Nuclear History and Indications    History and Indications Indication for Stress Test: Evaluation of extent and severity of coronary artery disease History: AFIB;CAD- (non-obstructive by CATH);Hx. Tachycardia;No prior respiratory history reported. Cardiac Risk Factors: CVA, Family History - CAD, History of Smoking, Hypertension, Lipids, NIDDM, Overweight and PVD  Symptoms: Chest Pain, Dizziness, DOE and Fatigue    Stress Findings    ECG Baseline ECG exhibits normal sinus rhythm.Inferior lateral MI. .   Stress Findings The patient exercised following the Bruce protocol.  The patient reported shortness of breath and fatigue during the stress test. The patient experienced no angina during the stress test.   The test was stopped because the patient complained of shortness of breath. Patient could hardly finish; two holding patient up.   Blood pressure and heart rate demonstrated a normal response to exercise. Overall, the patient's  exercise capacity was mildly impaired.   Recovery time: 6 minutes.  [facility] Treadmill Score: low risk The patient's response to exercise was adequate for diagnosis.   Response to Stress There was no ST segment deviation noted during stress.  Arrhythmias during stress: PVCs and couplets. .  Couplet.  There were no significant arrhythmias noted during the test.  ECG was interpretable and there was no significant change from baseline     Assessment / Plan: 1. Orthostatic dizziness. Improved. Would try to stay off alpha blockers completely.  2. Hypertension, controlled.  3. Nonobstructive coronary disease by cath in 2009. Now with symptoms of DOE which may be anginal equivalent symptoms. Stress Myoview is intermediate risk with new large inferolateral perfusion defect. Will plan on proceeding with cardiac cath. The procedure and risks were reviewed including but not limited to death, myocardial infarction, stroke, arrythmias, bleeding, transfusion, emergency surgery, dye allergy, or renal dysfunction. The patient voices understanding and is agreeable to proceed..  4. Paroxysmal Afib. Continue metoprolol and Xarelto. Hold Xarelto for 48 hours for cath.  5. History of CVA.  Riah Kehoe [place] MD, [facility]  09/22/2015 7:32 AM

## 2015-09-22 NOTE — Interval H&P Note (Signed)
History and Physical Interval Note:  09/22/2015 9:13 AM  [name]  has presented today for surgery, with the diagnosis of c/p  The various methods of treatment have been discussed with the patient and family. After consideration of risks, benefits and other options for treatment, the patient has consented to  Procedure(s): Left Heart Cath and Coronary Angiography (N/A) as a surgical intervention .  The patient's history has been reviewed, patient examined, no change in status, stable for surgery.  I have reviewed the patient's chart and labs.  Questions were answered to the patient's satisfaction.    Cath Lab Visit (complete for each Cath Lab visit)  Clinical Evaluation Leading to the Procedure:   ACS: No.  Non-ACS:    Anginal Classification: CCS II  Anti-ischemic medical therapy: Minimal Therapy (1 class of medications)  Non-Invasive Test Results: Intermediate-risk stress test findings: cardiac mortality 1-3%/year  Prior CABG: No previous CABG       Collier Salina [facility] 09/22/2015 9:13 AM

## 2015-09-22 NOTE — Discharge Instructions (Addendum)
Resume Xarelto tomorrow 09/23/15.  Resume Glucophage XR on Sunday 09/25/15  Hold Altace and Maxide for now.  We will check your kidney function with a blood test on Monday 09/26/15.   Radial Site Care Refer to this sheet in the next few weeks. These instructions provide you with information about caring for yourself after your procedure. Your health care provider may also give you more specific instructions. Your treatment has been planned according to current medical practices, but problems sometimes occur. Call your health care provider if you have any problems or questions after your procedure. WHAT TO EXPECT AFTER THE PROCEDURE After your procedure, it is typical to have the following:  Bruising at the radial site that usually fades within 1-2 weeks.  Blood collecting in the tissue (hematoma) that may be painful to the touch. It should usually decrease in size and tenderness within 1-2 weeks. HOME CARE INSTRUCTIONS  Take medicines only as directed by your health care provider.  You may shower 24-48 hours after the procedure or as directed by your health care provider. Remove the bandage (dressing) and gently wash the site with plain soap and water. Pat the area dry with a clean towel. Do not rub the site, because this may cause bleeding.  Do not take baths, swim, or use a hot tub until your health care provider approves.  Check your insertion site every day for redness, swelling, or drainage.  Do not apply powder or lotion to the site.  Do not flex or bend the affected arm for 24 hours or as directed by your health care provider.  Do not push or pull heavy objects with the affected arm for 24 hours or as directed by your health care provider.  Do not lift over 10 lb (4.5 kg) for 5 days after your procedure or as directed by your health care provider.  Ask your health care provider when it is okay to:  Return to work or school.  Resume usual physical activities or sports.  Resume  sexual activity.  Do not drive home if you are discharged the same day as the procedure. Have someone else drive you.  You may drive 24 hours after the procedure unless otherwise instructed by your health care provider.  Do not operate machinery or power tools for 24 hours after the procedure.  If your procedure was done as an outpatient procedure, which means that you went home the same day as your procedure, a responsible adult should be with you for the first 24 hours after you arrive home.  Keep all follow-up visits as directed by your health care provider. This is important. SEEK MEDICAL CARE IF:  You have a fever.  You have chills.  You have increased bleeding from the radial site. Hold pressure on the site. SEEK IMMEDIATE MEDICAL CARE IF:  You have unusual pain at the radial site.  You have redness, warmth, or swelling at the radial site.  You have drainage (other than a small amount of blood on the dressing) from the radial site.  The radial site is bleeding, and the bleeding does not stop after 30 minutes of holding steady pressure on the site.  Your arm or hand becomes pale, cool, tingly, or numb.   This information is not intended to replace advice given to you by your health care provider. Make sure you discuss any questions you have with your health care provider.   Document Released: 10/06/2010 Document Revised: 09/24/2014 Document Reviewed: 03/22/2014 Elsevier Interactive  Patient Education 2016 [employer].

## 2015-09-27 NOTE — Progress Notes (Signed)
Cardiology Office Note   Date:  09/28/2015   ID:  Adrian Singh, DOB March 02, 1934, MRN [id number]  PCP:  Viviana Simpler, MD  Cardiologist:  Dr. Martinique   Chief Complaint  Patient presents with  . Follow-up    seen for Dr. Martinique  . Coronary Artery Disease      History of Present Illness: Adrian Singh is a 82 y.o. male who presents for post hospital followup. He has a h/o nonobstructive CAD by cath in 2009, orthostatic hypotension, h/o embolic stroke presumably due to PAF on Xarelto. He had h/o HA on eliquis and symptom resolved after switching to Xarelto. He has h/o orthostatic dizziness improved after stopping diuretic and alpha blockers which he was taking for BPH. He was seen in the office on 08/10/2015 with shortness of breath with exertion. He had outpatient stress test done on 08/25/2015 which showed EF mildly decreased 45-54%, no significant ST elevation or depression during stress portion, overall considered low risk study, however has prior myocardial infarction with peri-infarct ischemia. The peri-infarct ischemia was felt to be new compared to the previous stress test, therefore outpatient cardiac catheterization was arranged. He underwent planned study on 09/22/2015 which showed 30% mid LAD lesion, 99% mid to distal left circumflex lesion, 50% OM 3 lesion. Due to poor support from right radial approach and concern for renal function, it was planned for staged PCI at a later time. He will need to hold metformin for 48 hours. He has also been instructed to hold Altace and Maxide and renal function rechecked. Staged PCI has been arranged for 10/06/2015. It is best to use left radial or femoral access. The plan is to start antiplatelet therapy with aspirin and Plavix, and will stop aspirin one month post-PCI and continue Plavix and Xarelto for one year.  He presents today for followup, he has occasional L sided chest tingling sensation when laying on that side, however denies prolonged  episode of chest discomfort. He has not started on aspirin. He has resume Xarelto and Maxide.    Past Medical History  Diagnosis Date  . Hypertrophy of prostate without urinary obstruction and other lower urinary tract symptoms (LUTS)   . Type II or unspecified type diabetes mellitus without mention of complication, not stated as uncontrolled   . Esophageal reflux   . Unspecified essential hypertension   . Osteoarthrosis, unspecified whether generalized or localized, unspecified site   . Plantar wart   . Tachycardia, unspecified   . Orthostatic lightheadedness   . Atrial fibrillation ([name])     x1  . CAD (coronary artery disease)     nonobstructive  . Chronic kidney disease, stage III (moderate)   . History of CVA (cerebrovascular accident)   . Thrombocytopenia ([name])   . Shortness of breath dyspnea     with exertion  . Stroke [facility])     " mini"  . Headache   . 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 Esophagogastroduodenoscopy  2001    with dilation  . Back surgery  2001  . Rotator cuff repair  2005    Left  . Cardiac catheterization    . Colonoscopy w/ biopsies and polypectomy    . Ear cyst excision N/A 04/06/2015    Procedure: EXCISION OF SCALP CYST;  Surgeon: Donnie Mesa, MD;  Location: [place];  Service: General;  Laterality: N/A;  . Cardiac catheterization  N/A 09/22/2015    Procedure: Left Heart Cath and Coronary Angiography;  Surgeon: Peter M Martinique, MD;  Location: [place] [facility];  Service: Cardiovascular;  Laterality: N/A;     Current Outpatient Prescriptions  Medication Sig Dispense Refill  . aspirin 81 MG tablet Take 81 mg by mouth daily.    . B Complex Vitamins (VITAMIN B COMPLEX) TABS Take 1 tablet by mouth 3 (three) times a week.     . Cholecalciferol (VITAMIN D) 2000 UNITS CAPS Take 2,000 Units by mouth daily.     . finasteride (PROSCAR) 5 MG tablet TAKE 1 TABLET (5 MG TOTAL) BY MOUTH DAILY. 90  tablet 3  . glimepiride (AMARYL) 4 MG tablet Take 4 mg by mouth daily with breakfast.    . glucose blood test strip 1 each by Other route as needed (TO CHECK SUGAR). Use as instructed    . LANCETS ULTRA THIN MISC 1 strip by Does not apply route daily.    . metFORMIN (GLUCOPHAGE-XR) 500 MG 24 hr tablet TAKE 2 TABLETS BY MOUTH DAILY WITH BREAKFAST.  1  . metoprolol succinate (TOPROL-XL) 50 MG 24 hr tablet TAKE 1 TABLET EVERY DAY 90 tablet 3  . Multiple Vitamin (MULTIVITAMIN WITH MINERALS) TABS tablet Take 1 tablet by mouth daily.    . pantoprazole (PROTONIX) 40 MG tablet Take 40 mg by mouth daily.    . ramipril (ALTACE) 2.5 MG capsule TAKE 1 CAPSULE (2.5 MG TOTAL) BY MOUTH DAILY.  3  . traMADol (ULTRAM) 50 MG tablet TAKE 1 TABLET BY MOUTH 3 TIMES A DAY AS NEEDED FOR PAIN 30 tablet 0  . triamterene-hydrochlorothiazide (MAXZIDE-25) 37.5-25 MG tablet Take 1 tablet by mouth daily.  3  . vitamin B-12 (CYANOCOBALAMIN) 1000 MCG tablet Take 1,000 mcg by mouth daily.    [name] 20 MG TABS tablet TAKE 1 TABLET BY MOUTH DAILY WITH SUPPER.  5  . nitroGLYCERIN (NITROSTAT) 0.4 MG SL tablet Place 1 tablet (0.4 mg total) under the tongue every 5 (five) minutes as needed for chest pain. 25 tablet 3   No current facility-administered medications for this visit.    Allergies:   Flomax and Doxazosin    Social History:  The patient  reports that he has quit smoking. He has never used smokeless tobacco. He reports that he does not drink alcohol or use illicit drugs.   Family History:  The patient's family history includes COPD in his brother; Cancer in his brother; Coronary artery disease in his mother; Heart attack in his mother; Heart disease in his brother; Heart failure in his mother; Lung cancer in his sister; Peripheral vascular disease in his father; Stroke in his father.    ROS:  Please see the history of present illness.   Otherwise, review of systems are positive for occasional chest tingling when  laying on left side. No bleeding.   All other systems are reviewed and negative.    PHYSICAL EXAM: VS:  BP 130/70 mmHg  Pulse 88  Ht 5\' 10"  (1.778 m)  Wt 217 lb 6.4 oz (98.612 kg)  BMI 31.19 kg/m2 , BMI Body mass index is 31.19 kg/(m^2). GEN: Well nourished, well developed, in no acute distress HEENT: normal Neck: no JVD, carotid bruits, or masses Cardiac: RRR; no murmurs, rubs, or gallops,no edema  Respiratory:  clear to auscultation bilaterally, normal work of breathing GI: soft, nontender, nondistended, + BS MS: no deformity or atrophy Skin: warm and dry, no rash Neuro:  Strength and sensation  are intact Psych: euthymic mood, full affect   EKG:  EKG is ordered today. The ekg ordered today demonstrates NSR, low voltage, TWI in lead I and aVL   Recent Labs: 03/28/2015: ALT 26 09/20/2015: Hemoglobin 12.9*; Platelets 160 09/22/2015: BUN 21*; Creatinine, Ser 1.38*; Potassium 4.4; Sodium 140    Lipid Panel    Component Value Date/Time   CHOL 135 03/28/2015 1133   TRIG 337.0* 03/28/2015 1133   HDL 30.70* 03/28/2015 1133   CHOLHDL 4 03/28/2015 1133   VLDL 67.4* 03/28/2015 1133   LDLCALC 47 04/01/2014 0556   LDLDIRECT 77.0 03/28/2015 1133      Wt Readings from Last 3 Encounters:  09/28/15 217 lb 6.4 oz (98.612 kg)  09/22/15 218 lb (98.884 kg)  08/25/15 218 lb (98.884 kg)      Other studies Reviewed: Additional studies/ records that were reviewed today include:   Stress myoview 08/25/2015 Study Highlights     The left ventricular ejection fraction is mildly decreased (45-54%).  Nuclear stress EF: 52%.  There was no ST segment deviation noted during stress.  This is a low risk study.  Findings consistent with prior myocardial infarction with peri-infarct ischemia.  Low risk stress nuclear study with a large, severe, partially reversible inferior lateral defect consistent with prior infarct and mild peri-infarct ischemia; EF 52 with inferior lateral hypokinesis.      Cath 09/22/2015 Conclusion     Mid LAD lesion, 30% stenosed.  Mid Cx to Dist Cx lesion, 99% stenosed.  3rd Mrg lesion, 50% stenosed.  1. Single vessel obstructive CAD  Plan: would recommend PCI of the LCx. The patient is symptomatic and medical therapy limited by history of orthostatic hypotension. With concerns about renal function and poor support from the right radial approach, I felt it would be best to stage the procedure. Will hold metformin for 48 hours. Hold Altace and Maxide until renal function checked in 3 days. Anticipate return for PCI on Jan 19th. Will need to use alternate approach- either left radial or femoral access. Would need to start antiplatelet therapy with ASA 81 mg daily and Plavix. Would stop ASA one month post PCI and continue Plavix and Xarelto for one year.     Review of the above records demonstrates:   Recent cath shows LCx stenosis, plan for outpatient staged PCI on 10/05/2014   ASSESSMENT AND PLAN:  1. CAD  - Outpatient myoview 08/25/2015 which showed EF mildly decreased 45-54%, no significant ST elevation or depression during stress portion, overall considered low risk study, however has prior myocardial infarction with peri-infarct ischemia.  - Cath 09/22/2015 which showed 30% mid LAD lesion, 99% mid to distal left circumflex lesion, 50% OM 3 lesion  - plan for outpatient cath and PCI on 1/19. Will obtain precath labs next Wednesday. Given the degree of CAD, will add Aspirin 81mg  on top of his Xarelto. Will defer to Dr. Martinique, whether to do triple therapy with ASA, plavix and Xarelto after PCI and drop ASA in 1 month vs only ASA and plavix after PCI and drop ASA after 1 month while starting Xarelto along with plavix. Depend on his risk of bleeding. Add PRN nitroglycerin for now. Instructed him regarding sign and symptom of MI and he will need to seek medical attention if symptom worse between now and next cath. Per last cath report, next time, consider  cath via L radial vs R femoral as it was difficult to engage the catheter last night.   - instructed  the patient to hold ramipril on the day of cath, hold Maxide, finasteride and metformin 1 day prior to cath and hold Xarelto 2 days prior to cath  2. orthostatic hypotension: avoid strong diuretic (currently on Maxide)  3. h/o embolic stroke presumably due to PAF on Xarelto  4. CKD stage III: obtain BMET. Repeat BMET today and repeat in 1 week prior to staged PCI   Current medicines are reviewed at length with the patient today.  The patient does not have concerns regarding medicines.  The following changes have been made:  Add nitroglycerine PRN, add ASA 81mg   Labs/ tests ordered today include:   Orders Placed This Encounter  Procedures  . Basic Metabolic Panel (BMET)  . CBC w/Diff  . INR/PT  . Basic Metabolic Panel (BMET)  . EKG 12-Lead     Disposition:   FU with Dr. Martinique or his APP depend on cath finding.   Hilbert Corrigan PA 09/28/2015 12:07 PM    [facility] [place], [place], [place]  [address] Phone: [phone]; Fax: [phone]

## 2015-09-28 ENCOUNTER — Encounter: Payer: Self-pay | Admitting: Physician Assistant

## 2015-09-28 ENCOUNTER — Ambulatory Visit (INDEPENDENT_AMBULATORY_CARE_PROVIDER_SITE_OTHER): Payer: Medicare Other | Admitting: Physician Assistant

## 2015-09-28 VITALS — BP 130/70 | HR 88 | Ht 70.0 in | Wt 217.4 lb

## 2015-09-28 DIAGNOSIS — I2510 Atherosclerotic heart disease of native coronary artery without angina pectoris: Secondary | ICD-10-CM | POA: Diagnosis not present

## 2015-09-28 DIAGNOSIS — I10 Essential (primary) hypertension: Secondary | ICD-10-CM | POA: Diagnosis not present

## 2015-09-28 LAB — BASIC METABOLIC PANEL
BUN: 27 mg/dL — ABNORMAL HIGH (ref 7–25)
CO2: 24 mmol/L (ref 20–31)
Calcium: 9.2 mg/dL (ref 8.6–10.3)
Chloride: 104 mmol/L (ref 98–110)
Creat: 1.75 mg/dL — ABNORMAL HIGH (ref 0.70–1.18)
Glucose, Bld: 189 mg/dL — ABNORMAL HIGH (ref 65–99)
Potassium: 4.4 mmol/L (ref 3.5–5.3)
Sodium: 137 mmol/L (ref 135–146)

## 2015-09-28 MED ORDER — NITROGLYCERIN 0.4 MG SL SUBL: 0.4000 mg | SUBLINGUAL_TABLET | SUBLINGUAL | Status: DC | PRN

## 2015-09-28 NOTE — Patient Instructions (Addendum)
Medication Instructions:  Your physician has recommended you make the following change in your medication:  1.  START Aspirin 81 mg taking 1 tablet daily 2.  START Nitroglycerin 0.4 s/l tablet taking as directed ONLY AS NEEDED Labwork: TODAY:  BMET  1 WEEK 10/05/15 BMET                             CBC W/DIFF                             LFT                             PT/INR  Testing/Procedures: NONE ORDERED   Follow-Up: Your physician recommends that you schedule a follow-up appointment in:  WILL BE DETERMINED AFTER THE CATH    Any Other Special Instructions Will Be Listed Below (If Applicable).   DO NOT EAT OR DRINK AFTER MIDNIGHT THE NIGHT BEFORE YOUR PROCEDURE Your cardiac catheterization is scheduled for 10/05/2014.   Please hold Fineasteride, metformin and Maxide (triameterene-HCTZ) 1 day prior to the procedure, please hold Ramipril on the day of procedure.   Please hold Xarelto 2 days prior to cath  Monitor for any sign of bleeding  If you need a refill on your cardiac medications before your next appointment, please call your pharmacy.

## 2015-09-29 ENCOUNTER — Telehealth: Payer: Self-pay | Admitting: *Deleted

## 2015-09-29 NOTE — Telephone Encounter (Signed)
Per Almyra Deforest, PA-C, spoke with pt re: labs.  Pt has been instructed to hold the Maxide until Sunday, 10-02-15 and to drink plenty of water daily to help flush his kidneys out.  Pt is already aware to come in for repeat labs 10/05/15.  Pt verbalized understanding.

## 2015-09-29 NOTE — Telephone Encounter (Signed)
-----   Message from [place], [place] sent at 09/29/2015  7:46 AM EST ----- Renal function did go down after cath, Creatinine which inversely correlate with kidney function trended up from previous 1.38 upto 1.75, we have 1 week to reverse this before next Thursday's cath and next Wednesday's lab. Please instruct the patient to drink a little more water to hydrate kidney, hold Maxide for the next 3 days. Recheck labs as previously scheduled next Wednesday. Otherwise followup the precath instruction I typed in his instruction during 1/11's office visit.

## 2015-09-30 ENCOUNTER — Ambulatory Visit (INDEPENDENT_AMBULATORY_CARE_PROVIDER_SITE_OTHER): Payer: Medicare Other | Admitting: Internal Medicine

## 2015-09-30 ENCOUNTER — Encounter: Payer: Self-pay | Admitting: Internal Medicine

## 2015-09-30 VITALS — BP 128/70 | HR 72 | Temp 98.4°F | Ht 70.0 in | Wt 218.0 lb

## 2015-09-30 DIAGNOSIS — Z0000 Encounter for general adult medical examination without abnormal findings: Secondary | ICD-10-CM

## 2015-09-30 DIAGNOSIS — E1142 Type 2 diabetes mellitus with diabetic polyneuropathy: Secondary | ICD-10-CM | POA: Diagnosis not present

## 2015-09-30 DIAGNOSIS — D696 Thrombocytopenia, unspecified: Secondary | ICD-10-CM

## 2015-09-30 DIAGNOSIS — N400 Enlarged prostate without lower urinary tract symptoms: Secondary | ICD-10-CM

## 2015-09-30 DIAGNOSIS — I6340 Cerebral infarction due to embolism of unspecified cerebral artery: Secondary | ICD-10-CM

## 2015-09-30 DIAGNOSIS — I25119 Atherosclerotic heart disease of native coronary artery with unspecified angina pectoris: Secondary | ICD-10-CM | POA: Diagnosis not present

## 2015-09-30 DIAGNOSIS — I480 Paroxysmal atrial fibrillation: Secondary | ICD-10-CM

## 2015-09-30 DIAGNOSIS — Z7189 Other specified counseling: Secondary | ICD-10-CM

## 2015-09-30 LAB — HEMOGLOBIN A1C: Hgb A1c MFr Bld: 8.2 % — ABNORMAL HIGH (ref 4.6–6.5)

## 2015-09-30 MED ORDER — ZOSTER VACCINE LIVE 19400 UNT/0.65ML ~~LOC~~ SOLR: 0.6500 mL | Freq: Once | SUBCUTANEOUS | Status: DC

## 2015-09-30 NOTE — Assessment & Plan Note (Signed)
See social history Blank forms given 

## 2015-09-30 NOTE — Progress Notes (Signed)
Subjective:    Patient ID: Adrian Singh, male    DOB: 1934-01-03, 82 y.o.   MRN: [id number]  HPI Here for Medicare wellness and follow up of chronic medical conditions Wife is with him Reviewed advanced directives Reviewed med list and other doctors-- [facility] for eyes, cardiologists is all No alcohol or tobacco No exercise at present Vision is fair--needs reading glasses. Hearing is not good--no aides Head cyst removed in July. Heart cath recently. No falls No depression or anhedonia Still drives and can do instrumental ADLs Memory is not as good--no striking change  Off doxazosin due to orthostatic hypotension Still on finasteride Had done well on flomax but price was an issue Feels he is voiding okay--nocturia x 3. Daytime is not much of an issue  Reviewed recent cardiology work up Cath again next week and probably intervention Gets chest pressure and feels full Very easy DOE--just starting to walk at times Does get sense of heart speeding up at times--especially if he gets dizzy (may skip then). No real change  Slight cramp in left hand left over from the stroke Continues on xarelto No other weakness  Checks sugars every few days fasting Over 200 lately. Usually under 140 Tingling in feet is not bad--seems to be better with the diuretic No pain in feet  Current Outpatient Prescriptions on File Prior to Visit  Medication Sig Dispense Refill  . aspirin 81 MG tablet Take 81 mg by mouth daily.    . B Complex Vitamins (VITAMIN B COMPLEX) TABS Take 1 tablet by mouth 3 (three) times a week.     . Cholecalciferol (VITAMIN D) 2000 UNITS CAPS Take 2,000 Units by mouth daily.     . finasteride (PROSCAR) 5 MG tablet TAKE 1 TABLET (5 MG TOTAL) BY MOUTH DAILY. 90 tablet 3  . glimepiride (AMARYL) 4 MG tablet Take 4 mg by mouth daily with breakfast.    . glucose blood test strip 1 each by Other route as needed (TO CHECK SUGAR). Use as instructed    . LANCETS ULTRA THIN MISC  1 strip by Does not apply route daily.    . metFORMIN (GLUCOPHAGE-XR) 500 MG 24 hr tablet TAKE 2 TABLETS BY MOUTH DAILY WITH BREAKFAST.  1  . metoprolol succinate (TOPROL-XL) 50 MG 24 hr tablet TAKE 1 TABLET EVERY DAY 90 tablet 3  . Multiple Vitamin (MULTIVITAMIN WITH MINERALS) TABS tablet Take 1 tablet by mouth daily.    . nitroGLYCERIN (NITROSTAT) 0.4 MG SL tablet Place 1 tablet (0.4 mg total) under the tongue every 5 (five) minutes as needed for chest pain. 25 tablet 3  . pantoprazole (PROTONIX) 40 MG tablet Take 40 mg by mouth daily.    . ramipril (ALTACE) 2.5 MG capsule TAKE 1 CAPSULE (2.5 MG TOTAL) BY MOUTH DAILY.  3  . traMADol (ULTRAM) 50 MG tablet TAKE 1 TABLET BY MOUTH 3 TIMES A DAY AS NEEDED FOR PAIN 30 tablet 0  . triamterene-hydrochlorothiazide (MAXZIDE-25) 37.5-25 MG tablet Take 1 tablet by mouth daily.  3  . vitamin B-12 (CYANOCOBALAMIN) 1000 MCG tablet Take 1,000 mcg by mouth daily.    [name] 20 MG TABS tablet TAKE 1 TABLET BY MOUTH DAILY WITH SUPPER.  5   No current facility-administered medications on file prior to visit.    Allergies  Allergen Reactions  . Flomax [Tamsulosin Hcl] Other (See Comments)    Dizziness-moderate in nature.  . Doxazosin Other (See Comments)    dizziness  Past Medical History  Diagnosis Date  . Hypertrophy of prostate without urinary obstruction and other lower urinary tract symptoms (LUTS)   . Type II or unspecified type diabetes mellitus without mention of complication, not stated as uncontrolled   . Esophageal reflux   . Unspecified essential hypertension   . Osteoarthrosis, unspecified whether generalized or localized, unspecified site   . Plantar wart   . Tachycardia, unspecified   . Orthostatic lightheadedness   . Atrial fibrillation ([place])     x1  . CAD (coronary artery disease)     nonobstructive  . Chronic kidney disease, stage III (moderate)   . History of CVA (cerebrovascular accident)   . Thrombocytopenia ([place])   .  Shortness of breath dyspnea     with exertion  . Stroke [facility])     " mini"  . Headache   . 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 Esophagogastroduodenoscopy  2001    with dilation  . Back surgery  2001  . Rotator cuff repair  2005    Left  . Cardiac catheterization    . Colonoscopy w/ biopsies and polypectomy    . Ear cyst excision N/A 04/06/2015    Procedure: EXCISION OF SCALP CYST;  Surgeon: Donnie Mesa, MD;  Location: [place];  Service: General;  Laterality: N/A;  . Cardiac catheterization N/A 09/22/2015    Procedure: Left Heart Cath and Coronary Angiography;  Surgeon: Peter M Martinique, MD;  Location: [place] [facility];  Service: Cardiovascular;  Laterality: N/A;    Family History  Problem Relation Age of Onset  . Stroke Father   . Peripheral vascular disease Father     amputation  . Heart failure Mother     CHF  . Coronary artery disease Mother   . Heart attack Mother     Multiple  . COPD Brother   . Heart disease Brother   . Cancer Brother     ?  . Lung cancer Sister     Social History   Social History  . Marital Status: Married    Spouse Name: N/A  . Number of Children: 4  . Years of Education: N/A   Occupational History  . 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 Smoking status: Former Research scientist (life sciences)  . Smokeless tobacco: Never Used     Comment: " Quit by age 78 "  . Alcohol Use: No  . Drug Use: No  . Sexual Activity: Not on file   Other Topics Concern  . Not on file   Social History Narrative   No living will   Plans wife and then children to make health care decisions for him if unable   Would request at least attempts at resuscitation but no prolonged life support   Doesn't think he would want tube feeds if cognitively unaware   Review of Systems Appetite is down Weight stable Sleeps okay overall Wears seat belt Teeth okay--has full dentures No major back or joint  pain Bowels are good. No blood No rash or suspicious skin lesions    Objective:   Physical Exam  Constitutional: He is oriented to person, place, and time. He appears well-developed and well-nourished. No distress.  HENT:  Mouth/Throat: Oropharynx is clear and moist. No oropharyngeal exudate.  Neck: Normal range of motion. Neck supple. No thyromegaly present.  Cardiovascular: Normal rate, regular  rhythm, normal heart sounds and intact distal pulses.  Exam reveals no gallop.   No murmur heard. Pulmonary/Chest: Effort normal and breath sounds normal. No respiratory distress. He has no wheezes. He has no rales.  Abdominal: Soft. There is no tenderness.  Musculoskeletal: He exhibits no edema or tenderness.  Lymphadenopathy:    He has no cervical adenopathy.  Neurological: He is alert and oriented to person, place, and time.  President-- "Obama, Rowe Clack, [place]" [phone] [id number] Recall 3/3  Normal sensation in feet (fine sensation)  Skin: No rash noted. No erythema.  No foot lesions  Psychiatric: He has a normal mood and affect. His behavior is normal.          Assessment & Plan:

## 2015-09-30 NOTE — Assessment & Plan Note (Signed)
Regular now On xarelto 

## 2015-09-30 NOTE — Assessment & Plan Note (Signed)
On finasteride If worsens, will restart the tamsulosin

## 2015-09-30 NOTE — Assessment & Plan Note (Signed)
Mild left hand symptoms persist On xarelto now

## 2015-09-30 NOTE — Assessment & Plan Note (Signed)
Scheduled for PCI next week

## 2015-09-30 NOTE — Progress Notes (Signed)
Pre visit review using our clinic review tool, if applicable. No additional management support is needed unless otherwise documented below in the visit note. 

## 2015-09-30 NOTE — Assessment & Plan Note (Signed)
Sugars worse  Will recheck Mild tingling in feet only--no rx needed

## 2015-09-30 NOTE — Assessment & Plan Note (Signed)
I have personally reviewed the Medicare Annual Wellness questionnaire and have noted 1. The patient's medical and social history 2. Their use of alcohol, tobacco or illicit drugs 3. Their current medications and supplements 4. The patient's functional ability including ADL's, fall risks, home safety risks and hearing or visual             impairment. 5. Diet and physical activities 6. Evidence for depression or mood disorders  The patients weight, height, BMI and visual acuity have been recorded in the chart I have made referrals, counseling and provided education to the patient based review of the above and I have provided the pt with a written personalized care plan for preventive services.  I have provided you with a copy of your personalized plan for preventive services. Please take the time to review along with your updated medication list.  No cancer screening due to age Will send Rx for zostavax Hopes to exercise again after angiogram and heart Rx

## 2015-09-30 NOTE — Assessment & Plan Note (Signed)
Mildly low at times No abnormal bleeding

## 2015-10-04 ENCOUNTER — Encounter: Payer: Self-pay | Admitting: *Deleted

## 2015-10-05 ENCOUNTER — Other Ambulatory Visit (INDEPENDENT_AMBULATORY_CARE_PROVIDER_SITE_OTHER): Payer: Medicare Other | Admitting: *Deleted

## 2015-10-05 DIAGNOSIS — I2510 Atherosclerotic heart disease of native coronary artery without angina pectoris: Secondary | ICD-10-CM | POA: Diagnosis not present

## 2015-10-05 DIAGNOSIS — I10 Essential (primary) hypertension: Secondary | ICD-10-CM

## 2015-10-05 LAB — CBC WITH DIFFERENTIAL/PLATELET
Basophils Absolute: 0.1 10*3/uL (ref 0.0–0.1)
Basophils Relative: 1 % (ref 0–1)
Eosinophils Absolute: 0.1 10*3/uL (ref 0.0–0.7)
Eosinophils Relative: 1 % (ref 0–5)
HCT: 38.2 % — ABNORMAL LOW (ref 39.0–52.0)
Hemoglobin: 13 g/dL (ref 13.0–17.0)
Lymphocytes Relative: 25 % (ref 12–46)
Lymphs Abs: 1.5 10*3/uL (ref 0.7–4.0)
MCH: 29.9 pg (ref 26.0–34.0)
MCHC: 34 g/dL (ref 30.0–36.0)
MCV: 87.8 fL (ref 78.0–100.0)
MPV: 12.2 fL (ref 8.6–12.4)
Monocytes Absolute: 0.6 10*3/uL (ref 0.1–1.0)
Monocytes Relative: 11 % (ref 3–12)
Neutro Abs: 3.7 10*3/uL (ref 1.7–7.7)
Neutrophils Relative %: 62 % (ref 43–77)
Platelets: 156 10*3/uL (ref 150–400)
RBC: 4.35 MIL/uL (ref 4.22–5.81)
RDW: 13.1 % (ref 11.5–15.5)
WBC: 5.9 10*3/uL (ref 4.0–10.5)

## 2015-10-05 LAB — BASIC METABOLIC PANEL
BUN: 17 mg/dL (ref 7–25)
CO2: 24 mmol/L (ref 20–31)
Calcium: 9.5 mg/dL (ref 8.6–10.3)
Chloride: 102 mmol/L (ref 98–110)
Creat: 1.57 mg/dL — ABNORMAL HIGH (ref 0.70–1.18)
Glucose, Bld: 269 mg/dL — ABNORMAL HIGH (ref 65–99)
Potassium: 4.1 mmol/L (ref 3.5–5.3)
Sodium: 138 mmol/L (ref 135–146)

## 2015-10-05 LAB — PROTIME-INR
INR: 1.17 (ref <1.50)
Prothrombin Time: 15.1 seconds (ref 11.6–15.2)

## 2015-10-06 ENCOUNTER — Ambulatory Visit (HOSPITAL_COMMUNITY)
Admission: RE | Admit: 2015-10-06 | Discharge: 2015-10-07 | Disposition: A | Discharge status: Home / Self Care | Payer: Medicare Other | Source: Ambulatory Visit | Attending: Cardiology | Admitting: Cardiology

## 2015-10-06 ENCOUNTER — Encounter (HOSPITAL_COMMUNITY): Payer: Self-pay | Admitting: Cardiology

## 2015-10-06 ENCOUNTER — Encounter (HOSPITAL_COMMUNITY)
Admission: RE | Disposition: A | Discharge status: Home / Self Care | Payer: Self-pay | Source: Ambulatory Visit | Attending: Cardiology

## 2015-10-06 ENCOUNTER — Ambulatory Visit: Payer: Medicare Other | Admitting: Physician Assistant

## 2015-10-06 DIAGNOSIS — I480 Paroxysmal atrial fibrillation: Secondary | ICD-10-CM | POA: Diagnosis not present

## 2015-10-06 DIAGNOSIS — E785 Hyperlipidemia, unspecified: Secondary | ICD-10-CM | POA: Diagnosis not present

## 2015-10-06 DIAGNOSIS — Z87891 Personal history of nicotine dependence: Secondary | ICD-10-CM | POA: Diagnosis not present

## 2015-10-06 DIAGNOSIS — R42 Dizziness and giddiness: Secondary | ICD-10-CM | POA: Diagnosis not present

## 2015-10-06 DIAGNOSIS — I129 Hypertensive chronic kidney disease with stage 1 through stage 4 chronic kidney disease, or unspecified chronic kidney disease: Secondary | ICD-10-CM | POA: Diagnosis not present

## 2015-10-06 DIAGNOSIS — I25119 Atherosclerotic heart disease of native coronary artery with unspecified angina pectoris: Secondary | ICD-10-CM | POA: Diagnosis not present

## 2015-10-06 DIAGNOSIS — Z7901 Long term (current) use of anticoagulants: Secondary | ICD-10-CM | POA: Diagnosis not present

## 2015-10-06 DIAGNOSIS — I209 Angina pectoris, unspecified: Secondary | ICD-10-CM | POA: Diagnosis present

## 2015-10-06 DIAGNOSIS — IMO0002 Reserved for concepts with insufficient information to code with codable children: Secondary | ICD-10-CM | POA: Diagnosis present

## 2015-10-06 DIAGNOSIS — I10 Essential (primary) hypertension: Secondary | ICD-10-CM | POA: Diagnosis present

## 2015-10-06 DIAGNOSIS — Z8673 Personal history of transient ischemic attack (TIA), and cerebral infarction without residual deficits: Secondary | ICD-10-CM | POA: Insufficient documentation

## 2015-10-06 DIAGNOSIS — I2510 Atherosclerotic heart disease of native coronary artery without angina pectoris: Secondary | ICD-10-CM | POA: Diagnosis present

## 2015-10-06 DIAGNOSIS — E1140 Type 2 diabetes mellitus with diabetic neuropathy, unspecified: Secondary | ICD-10-CM | POA: Diagnosis present

## 2015-10-06 DIAGNOSIS — Z7984 Long term (current) use of oral hypoglycemic drugs: Secondary | ICD-10-CM | POA: Diagnosis not present

## 2015-10-06 DIAGNOSIS — Z8249 Family history of ischemic heart disease and other diseases of the circulatory system: Secondary | ICD-10-CM | POA: Diagnosis not present

## 2015-10-06 DIAGNOSIS — E1122 Type 2 diabetes mellitus with diabetic chronic kidney disease: Secondary | ICD-10-CM | POA: Diagnosis not present

## 2015-10-06 DIAGNOSIS — Z85828 Personal history of other malignant neoplasm of skin: Secondary | ICD-10-CM | POA: Insufficient documentation

## 2015-10-06 DIAGNOSIS — N183 Chronic kidney disease, stage 3 unspecified: Secondary | ICD-10-CM | POA: Diagnosis present

## 2015-10-06 DIAGNOSIS — E1142 Type 2 diabetes mellitus with diabetic polyneuropathy: Secondary | ICD-10-CM | POA: Diagnosis not present

## 2015-10-06 DIAGNOSIS — Z955 Presence of coronary angioplasty implant and graft: Secondary | ICD-10-CM

## 2015-10-06 DIAGNOSIS — E1165 Type 2 diabetes mellitus with hyperglycemia: Secondary | ICD-10-CM | POA: Diagnosis present

## 2015-10-06 HISTORY — PX: CORONARY STENT PLACEMENT: SHX1402

## 2015-10-06 HISTORY — PX: CARDIAC CATHETERIZATION: SHX172

## 2015-10-06 HISTORY — DX: Type 2 diabetes mellitus without complications: E11.9

## 2015-10-06 HISTORY — DX: Unspecified osteoarthritis, unspecified site: M19.90

## 2015-10-06 LAB — GLUCOSE, CAPILLARY
Glucose-Capillary: 181 mg/dL — ABNORMAL HIGH (ref 65–99)
Glucose-Capillary: 183 mg/dL — ABNORMAL HIGH (ref 65–99)

## 2015-10-06 LAB — POCT ACTIVATED CLOTTING TIME
Activated Clotting Time: 245 seconds
Activated Clotting Time: 255 seconds

## 2015-10-06 SURGERY — CORONARY STENT INTERVENTION

## 2015-10-06 MED ORDER — NITROGLYCERIN 1 MG/10 ML FOR IR/CATH LAB
INTRA_ARTERIAL | Status: DC | PRN
  Administered 2015-10-06: 08:00:00

## 2015-10-06 MED ORDER — CLOPIDOGREL BISULFATE 75 MG PO TABS
600.0000 mg | ORAL_TABLET | Freq: Once | ORAL | Status: AC
  Administered 2015-10-06: 600 mg via ORAL

## 2015-10-06 MED ORDER — INSULIN ASPART 100 UNIT/ML ~~LOC~~ SOLN: 0.0000 [IU] | Freq: Three times a day (TID) | SUBCUTANEOUS | Status: DC

## 2015-10-06 MED ORDER — FENTANYL CITRATE (PF) 100 MCG/2ML IJ SOLN
INTRAMUSCULAR | Status: AC
  Filled 2015-10-06: qty 2

## 2015-10-06 MED ORDER — LIDOCAINE HCL (PF) 1 % IJ SOLN
INTRAMUSCULAR | Status: AC
  Filled 2015-10-06: qty 30

## 2015-10-06 MED ORDER — HEPARIN (PORCINE) IN NACL 2-0.9 UNIT/ML-% IJ SOLN
INTRAMUSCULAR | Status: AC
  Filled 2015-10-06: qty 1000

## 2015-10-06 MED ORDER — ASPIRIN EC 81 MG PO TBEC
81.0000 mg | DELAYED_RELEASE_TABLET | Freq: Every day | ORAL | Status: DC
  Administered 2015-10-07: 81 mg via ORAL
  Filled 2015-10-06: qty 1

## 2015-10-06 MED ORDER — SODIUM CHLORIDE 0.9 % IJ SOLN: 3.0000 mL | Freq: Two times a day (BID) | INTRAMUSCULAR | Status: DC

## 2015-10-06 MED ORDER — SODIUM CHLORIDE 0.9 % IJ SOLN: 3.0000 mL | INTRAMUSCULAR | Status: DC | PRN

## 2015-10-06 MED ORDER — FENTANYL CITRATE (PF) 100 MCG/2ML IJ SOLN
INTRAMUSCULAR | Status: DC | PRN
  Administered 2015-10-06: 25 ug via INTRAVENOUS

## 2015-10-06 MED ORDER — HEPARIN SODIUM (PORCINE) 1000 UNIT/ML IJ SOLN
INTRAMUSCULAR | Status: AC
  Filled 2015-10-06: qty 1

## 2015-10-06 MED ORDER — IOHEXOL 350 MG/ML SOLN
INTRAVENOUS | Status: DC | PRN
  Administered 2015-10-06: 80 mL via INTRACARDIAC

## 2015-10-06 MED ORDER — CLOPIDOGREL BISULFATE 75 MG PO TABS
75.0000 mg | ORAL_TABLET | Freq: Every day | ORAL | Status: DC
  Administered 2015-10-07: 75 mg via ORAL
  Filled 2015-10-06: qty 1

## 2015-10-06 MED ORDER — NITROGLYCERIN 1 MG/10 ML FOR IR/CATH LAB
INTRA_ARTERIAL | Status: DC | PRN
  Administered 2015-10-06: 200 ug via INTRACORONARY

## 2015-10-06 MED ORDER — GLIMEPIRIDE 4 MG PO TABS
4.0000 mg | ORAL_TABLET | Freq: Every day | ORAL | Status: DC
  Administered 2015-10-06 – 2015-10-07 (×2): 4 mg via ORAL
  Filled 2015-10-06 (×2): qty 1

## 2015-10-06 MED ORDER — METOPROLOL SUCCINATE ER 50 MG PO TB24
50.0000 mg | ORAL_TABLET | Freq: Every day | ORAL | Status: DC
  Administered 2015-10-06 – 2015-10-07 (×2): 50 mg via ORAL
  Filled 2015-10-06 (×2): qty 1

## 2015-10-06 MED ORDER — ADULT MULTIVITAMIN W/MINERALS CH
1.0000 | ORAL_TABLET | Freq: Every day | ORAL | Status: DC
  Administered 2015-10-06 – 2015-10-07 (×2): 1 via ORAL
  Filled 2015-10-06 (×2): qty 1

## 2015-10-06 MED ORDER — RAMIPRIL 2.5 MG PO CAPS
2.5000 mg | ORAL_CAPSULE | Freq: Every day | ORAL | Status: DC
  Administered 2015-10-06: 09:00:00 2.5 mg via ORAL
  Filled 2015-10-06 (×2): qty 1

## 2015-10-06 MED ORDER — SODIUM CHLORIDE 0.9 % IJ SOLN
3.0000 mL | Freq: Two times a day (BID) | INTRAMUSCULAR | Status: DC
Start: 2015-10-06 — End: 2015-10-07

## 2015-10-06 MED ORDER — ACETAMINOPHEN 325 MG PO TABS: 650.0000 mg | ORAL_TABLET | ORAL | Status: DC | PRN

## 2015-10-06 MED ORDER — SODIUM CHLORIDE 0.9 % IV SOLN: 250.0000 mL | INTRAVENOUS | Status: DC | PRN

## 2015-10-06 MED ORDER — SODIUM CHLORIDE 0.9 % WEIGHT BASED INFUSION: 3.0000 mL/kg/h | INTRAVENOUS | Status: DC

## 2015-10-06 MED ORDER — VERAPAMIL HCL 2.5 MG/ML IV SOLN
INTRAVENOUS | Status: AC
  Filled 2015-10-06: qty 2

## 2015-10-06 MED ORDER — ASPIRIN 81 MG PO CHEW: 81.0000 mg | CHEWABLE_TABLET | ORAL | Status: DC

## 2015-10-06 MED ORDER — VERAPAMIL HCL 2.5 MG/ML IV SOLN
INTRAVENOUS | Status: DC | PRN
  Administered 2015-10-06: 10 mL via INTRA_ARTERIAL

## 2015-10-06 MED ORDER — ONDANSETRON HCL 4 MG/2ML IJ SOLN: 4.0000 mg | Freq: Four times a day (QID) | INTRAMUSCULAR | Status: DC | PRN

## 2015-10-06 MED ORDER — MIDAZOLAM HCL 2 MG/2ML IJ SOLN
INTRAMUSCULAR | Status: AC
  Filled 2015-10-06: qty 2

## 2015-10-06 MED ORDER — NITROGLYCERIN 0.4 MG SL SUBL: 0.4000 mg | SUBLINGUAL_TABLET | SUBLINGUAL | Status: DC | PRN

## 2015-10-06 MED ORDER — SODIUM CHLORIDE 0.9 % WEIGHT BASED INFUSION
1.0000 mL/kg/h | INTRAVENOUS | Status: DC
Start: 2015-10-06 — End: 2015-10-06

## 2015-10-06 MED ORDER — TRAMADOL HCL 50 MG PO TABS
50.0000 mg | ORAL_TABLET | Freq: Four times a day (QID) | ORAL | Status: DC | PRN
  Administered 2015-10-06: 50 mg via ORAL
  Filled 2015-10-06: qty 1

## 2015-10-06 MED ORDER — HEPARIN (PORCINE) IN NACL 2-0.9 UNIT/ML-% IJ SOLN: INTRAMUSCULAR | Status: DC | PRN

## 2015-10-06 MED ORDER — FINASTERIDE 5 MG PO TABS
5.0000 mg | ORAL_TABLET | Freq: Every day | ORAL | Status: DC
  Administered 2015-10-06: 17:00:00 5 mg via ORAL
  Filled 2015-10-06 (×2): qty 1

## 2015-10-06 MED ORDER — PANTOPRAZOLE SODIUM 40 MG PO TBEC
40.0000 mg | DELAYED_RELEASE_TABLET | Freq: Every day | ORAL | Status: DC
  Administered 2015-10-06 – 2015-10-07 (×2): 40 mg via ORAL
  Filled 2015-10-06 (×2): qty 1

## 2015-10-06 MED ORDER — INSULIN ASPART 100 UNIT/ML ~~LOC~~ SOLN: 0.0000 [IU] | Freq: Every day | SUBCUTANEOUS | Status: DC

## 2015-10-06 MED ORDER — NITROGLYCERIN 1 MG/10 ML FOR IR/CATH LAB
INTRA_ARTERIAL | Status: AC
  Filled 2015-10-06: qty 10

## 2015-10-06 MED ORDER — MIDAZOLAM HCL 2 MG/2ML IJ SOLN
INTRAMUSCULAR | Status: DC | PRN
  Administered 2015-10-06: 2 mg via INTRAVENOUS

## 2015-10-06 MED ORDER — CLOPIDOGREL BISULFATE 300 MG PO TABS
ORAL_TABLET | ORAL | Status: AC
  Filled 2015-10-06: qty 2

## 2015-10-06 MED ORDER — HEPARIN SODIUM (PORCINE) 1000 UNIT/ML IJ SOLN
INTRAMUSCULAR | Status: DC | PRN
  Administered 2015-10-06: 8000 [IU] via INTRAVENOUS
  Administered 2015-10-06: 2000 [IU] via INTRAVENOUS

## 2015-10-06 MED ORDER — SODIUM CHLORIDE 0.9 % WEIGHT BASED INFUSION
3.0000 mL/kg/h | INTRAVENOUS | Status: DC
  Administered 2015-10-06: 3 mL/kg/h via INTRAVENOUS

## 2015-10-06 SURGICAL SUPPLY — 12 items
BALLN EUPHORA RX 2.0X15 (BALLOONS) ×2
BALLOON EUPHORA RX 2.0X15 (BALLOONS) ×1 IMPLANT
GLIDESHEATH SLEND SS 6F .021 (SHEATH) ×2 IMPLANT
GUIDE CATH RUNWAY 6FR CLS3.5 (CATHETERS) ×2 IMPLANT
KIT ENCORE 26 ADVANTAGE (KITS) ×2 IMPLANT
KIT HEART LEFT (KITS) ×2 IMPLANT
PACK CARDIAC CATHETERIZATION (CUSTOM PROCEDURE TRAY) ×2 IMPLANT
STENT PROMUS PREM MR 2.5X16 (Permanent Stent) ×2 IMPLANT
TRANSDUCER W/STOPCOCK (MISCELLANEOUS) ×2 IMPLANT
TUBING CIL FLEX 10 FLL-RA (TUBING) ×2 IMPLANT
WIRE ASAHI PROWATER 180CM (WIRE) ×2 IMPLANT
WIRE SAFE-T 1.5MM-J .035X260CM (WIRE) ×2 IMPLANT

## 2015-10-06 NOTE — Progress Notes (Signed)
TR BAND REMOVAL  LOCATION:    right radial  DEFLATED PER PROTOCOL:    Yes.    TIME BAND OFF / DRESSING APPLIED:    1710   SITE UPON ARRIVAL:    Level 0  SITE AFTER BAND REMOVAL:    Level 0  CIRCULATION SENSATION AND MOVEMENT:    Within Normal Limits   Yes.    COMMENTS:   Band removed and dressing applied per protocol. Slight oozing. Pressure held x5 mins oozing stopped. Bandage applied.

## 2015-10-06 NOTE — H&P (View-Only) (Signed)
Adrian Singh Date of Birth: [birth year]   History of Present Illness: Adrian Singh is seen today for follow up CAD. He has a history of nonobstructive coronary disease by cardiac catheterization 2009. He also has a history of orthostatic dizziness. He was admitted in July [id number] with an embolic stroke presumably due to paroxysmal Afib. He was anticoagulated with Eliquis. The patient developed tachycardia and HA on Eliquis that resolved when he was switched to Xarelto. Previous orthostatic dizziness improved with stopping diuretic and alpha blockers he was taking for enlarged prostate.  On follow up  he complains of SOB with exertion when walking over 200 ft. No chest pain. Still has some mild dizziness when he bends over and stands up. To further evaluate his dyspnea he underwent a Nuclear stress test that was abnormal. EF 52% with large inferolateral defect consistent with infarct. This was a new finding compared to 2005. Given these results cardiac cath was recommended.   Current Outpatient Prescriptions on File Prior to Visit  Medication Sig Dispense Refill  . B Complex Vitamins (VITAMIN B COMPLEX) TABS Take 1 tablet by mouth 3 (three) times a week.     Marland Kitchen BAYER CONTOUR NEXT TEST test strip CHECK BLOOD SUGAR 1-2 TIMES DAILY AND AS INSTRUCTED. DX CODE 250.00. (Patient taking differently: Check blood sugar 2 times weekly) 100 each 2  . Cholecalciferol (VITAMIN D) 2000 UNITS CAPS Take 2,000 Units by mouth daily.     . finasteride (PROSCAR) 5 MG tablet TAKE 1 TABLET (5 MG TOTAL) BY MOUTH DAILY. 90 tablet 3  . glimepiride (AMARYL) 4 MG tablet TAKE 1 TABLET WITH BREAKFAST OR FIRST MAIN MEAL OF DAY (Patient taking differently: Take 4 mg by mouth with breakfast or first main meal of day) 90 tablet 3  . LANCETS ULTRA THIN MISC Used to test blood sugar 1-2 times daily dx: 250.00 (Patient taking differently: 1 each by Other route See admin instructions. Check blood sugar twice  weekly) 100 each 0  . metFORMIN (GLUCOPHAGE-XR) 500 MG 24 hr tablet TAKE 2 TABLETS BY MOUTH DAILY WITH BREAKFAST. 180 tablet 1  . metoprolol succinate (TOPROL-XL) 50 MG 24 hr tablet TAKE 1 TABLET EVERY DAY 90 tablet 3  . pantoprazole (PROTONIX) 40 MG tablet TAKE 1 TABLET BY MOUTH EVERY DAY (Patient taking differently: Take 40mg  by mouth every day) 90 tablet 3  . ramipril (ALTACE) 2.5 MG capsule TAKE 1 CAPSULE (2.5 MG TOTAL) BY MOUTH DAILY. 90 capsule 3  . traMADol (ULTRAM) 50 MG tablet TAKE 1 TABLET BY MOUTH 3 TIMES DAILY AS NEEDED FOR PAIN 30 tablet 0  . triamterene-hydrochlorothiazide (MAXZIDE-25) 37.5-25 MG tablet TAKE 1 TABLET BY MOUTH EVERY DAY 90 tablet 3  . vitamin B-12 (CYANOCOBALAMIN) 1000 MCG tablet Take 1,000 mcg by mouth daily.    [name] 20 MG TABS tablet TAKE 1 TABLET (20 MG TOTAL) BY MOUTH DAILY WITH SUPPER. 30 tablet 5   No current facility-administered medications on file prior to visit.    Allergies  Allergen Reactions  . Doxazosin Other (See Comments)    dizziness    Past Medical History  Diagnosis Date  . Hypertrophy of prostate without urinary obstruction and other lower urinary tract symptoms (LUTS)   . Type II or unspecified type diabetes mellitus without mention of complication, not stated as uncontrolled   . Esophageal reflux   . Unspecified essential hypertension   . Osteoarthrosis, unspecified whether generalized or localized, unspecified site   . Plantar wart   .  Tachycardia, unspecified   . Orthostatic lightheadedness   . Atrial fibrillation ([place])     x1  . CAD (coronary artery disease)     nonobstructive  . Chronic kidney disease, stage III (moderate)   . History of CVA (cerebrovascular accident)   . Thrombocytopenia ([place])   . Shortness of breath dyspnea     with exertion  . Stroke [facility])     " mini"  . Headache   . Cancer  [facility])     skin cancer on ear and back    Past Surgical History  Procedure Laterality Date  . Esophagogastroduodenoscopy  2001    with dilation  . Back surgery  2001  . Rotator cuff repair  2005    Left  . Cardiac catheterization    . Colonoscopy w/ biopsies and polypectomy    . Ear cyst excision N/A 04/06/2015    Procedure: EXCISION OF SCALP CYST; Surgeon: Donnie Mesa, MD; Location: [place]; Service: General; Laterality: N/A;    History  Smoking status  . Former Smoker  Smokeless tobacco  . Never Used    Comment: " Quit by age 45 "    History  Alcohol Use No    Family History  Problem Relation Age of Onset  . Stroke Father   . Peripheral vascular disease Father     amputation  . Heart failure Mother     CHF  . Coronary artery disease Mother   . Heart attack Mother     Multiple  . COPD Brother   . Heart disease Brother   . Cancer Brother     ?  . Lung cancer Sister     Review of Systems: The review of systems is positive for orthostatic lightheadedness. All other systems were reviewed and are negative.  Physical Exam: BP 118/74 mmHg  Pulse 81  Ht 5\' 10"  (1.778 m)  Wt 98.884 kg (218 lb)  BMI 31.28 kg/m2 He is a pleasant white male in no acute distress. The HEENT exam is normal. The carotids are 2+ without bruits. There is no thyromegaly. There is no JVD. The lungs are clear. The heart exam reveals a regular rate with a normal S1 and S2. There are no murmurs, gallops, or rubs. The PMI is not displaced. Abdominal exam reveals good bowel sounds. There is no hepatosplenomegaly or tenderness. There are no masses. Exam of the legs reveal no clubbing, cyanosis, or edema. The distal pulses are intact. Cranial nerves II - XII are intact. Motor and sensory functions are intact. The gait is normal.  LABORATORY DATA: Echo 04/01/14: Study  Conclusions  - Left ventricle: The cavity size was normal. Wall thickness was increased in a pattern of mild LVH. Systolic function was vigorous. The estimated ejection fraction was in the range of 65% to 70%. Doppler parameters are consistent with abnormal left ventricular relaxation (grade 1 diastolic dysfunction).  Ecg today shows NSR with rate 81. LAD. Findings consistent with old infero-posterior infarct. I have personally reviewed and interpreted this study.  Study Highlights     The left ventricular ejection fraction is mildly decreased (45-54%).  Nuclear stress EF: 52%.  There was no ST segment deviation noted during stress.  This is a low risk study.  Findings consistent with prior myocardial infarction with peri-infarct ischemia.  Low risk stress nuclear study with a large, severe, partially reversible inferior lateral defect consistent with prior infarct and mild peri-infarct ischemia; EF 52 with inferior lateral hypokinesis.  Nuclear History and Indications    History and Indications Indication for Stress Test: Evaluation of extent and severity of coronary artery disease History: AFIB;CAD- (non-obstructive by CATH);Hx. Tachycardia;No prior respiratory history reported. Cardiac Risk Factors: CVA, Family History - CAD, History of Smoking, Hypertension, Lipids, NIDDM, Overweight and PVD  Symptoms: Chest Pain, Dizziness, DOE and Fatigue    Stress Findings    ECG Baseline ECG exhibits normal sinus rhythm.Inferior lateral MI. .   Stress Findings The patient exercised following the Bruce protocol.  The patient reported shortness of breath and fatigue during the stress test. The patient experienced no angina during the stress test.   The test was stopped because the patient complained of shortness of breath. Patient could hardly finish; two holding patient up.   Blood pressure and heart rate demonstrated a normal response to exercise. Overall, the patient's  exercise capacity was mildly impaired.   Recovery time: 6 minutes.  Duke Treadmill Score: low risk The patient's response to exercise was adequate for diagnosis.   Response to Stress There was no ST segment deviation noted during stress.  Arrhythmias during stress: PVCs and couplets. .  Couplet.  There were no significant arrhythmias noted during the test.  ECG was interpretable and there was no significant change from baseline     Assessment / Plan: 1. Orthostatic dizziness. Improved. Would try to stay off alpha blockers completely.  2. Hypertension, controlled.  3. Nonobstructive coronary disease by cath in 2009. Now with symptoms of DOE which may be anginal equivalent symptoms. Stress Myoview is intermediate risk with new large inferolateral perfusion defect. Will plan on proceeding with cardiac cath. The procedure and risks were reviewed including but not limited to death, myocardial infarction, stroke, arrythmias, bleeding, transfusion, emergency surgery, dye allergy, or renal dysfunction. The patient voices understanding and is agreeable to proceed..  4. Paroxysmal Afib. Continue metoprolol and Xarelto. Hold Xarelto for 48 hours for cath.  5. History of CVA.  Adrian Blacksher [place] MD, [facility]  09/22/2015 7:32 AM

## 2015-10-06 NOTE — Interval H&P Note (Signed)
History and Physical Interval Note:  10/06/2015 7:28 AM  Adrian Singh  has presented today for surgery, with the diagnosis of cad  The various methods of treatment have been discussed with the patient and family. After consideration of risks, benefits and other options for treatment, the patient has consented to  Procedure(s): Coronary Stent Intervention (N/A) as a surgical intervention .  The patient's history has been reviewed, patient examined, no change in status, stable for surgery.  I have reviewed the patient's chart and labs.  Questions were answered to the patient's satisfaction.    Cath Lab Visit (complete for each Cath Lab visit)  Clinical Evaluation Leading to the Procedure:   ACS: No.  Non-ACS:    Anginal Classification: CCS III  Anti-ischemic medical therapy: Minimal Therapy (1 class of medications)  Non-Invasive Test Results: Intermediate-risk stress test findings: cardiac mortality 1-3%/year  Prior CABG: No previous CABG       Collier Salina [facility] 10/06/2015 7:28 AM

## 2015-10-06 NOTE — Progress Notes (Signed)
Inpatient Diabetes Program Recommendations  AACE/ADA: New Consensus Statement on Inpatient Glycemic Control (2015)  Target Ranges:  Prepandial:   less than 140 mg/dL      Peak postprandial:   less than 180 mg/dL (1-2 hours)      Critically ill patients:  140 - 180 mg/dL   Review of Glycemic Control  Diabetes history: DM 2 Outpatient Diabetes medications: Metformin 1000 mg Daily, Amaryl 4 mg Daily Current orders for Inpatient glycemic control: Amaryl 4 mg Daily  Inpatient Diabetes Program Recommendations: Correction (SSI): While inpatient, please consider placing patient on Novolog Sensitive Correction TID + HS scale.   Thanks,  Tama Headings RN, MSN, [facility] Inpatient Diabetes Coordinator Team Pager [phone] (8a-5p)

## 2015-10-07 ENCOUNTER — Encounter (HOSPITAL_COMMUNITY): Payer: Self-pay | Admitting: General Practice

## 2015-10-07 DIAGNOSIS — I129 Hypertensive chronic kidney disease with stage 1 through stage 4 chronic kidney disease, or unspecified chronic kidney disease: Secondary | ICD-10-CM | POA: Diagnosis not present

## 2015-10-07 DIAGNOSIS — I25119 Atherosclerotic heart disease of native coronary artery with unspecified angina pectoris: Secondary | ICD-10-CM | POA: Diagnosis not present

## 2015-10-07 DIAGNOSIS — I209 Angina pectoris, unspecified: Secondary | ICD-10-CM

## 2015-10-07 DIAGNOSIS — Z7901 Long term (current) use of anticoagulants: Secondary | ICD-10-CM | POA: Diagnosis not present

## 2015-10-07 DIAGNOSIS — I480 Paroxysmal atrial fibrillation: Secondary | ICD-10-CM

## 2015-10-07 DIAGNOSIS — N183 Chronic kidney disease, stage 3 (moderate): Secondary | ICD-10-CM | POA: Diagnosis not present

## 2015-10-07 DIAGNOSIS — I10 Essential (primary) hypertension: Secondary | ICD-10-CM

## 2015-10-07 LAB — BASIC METABOLIC PANEL
Anion gap: 8 (ref 5–15)
BUN: 16 mg/dL (ref 6–20)
CO2: 25 mmol/L (ref 22–32)
Calcium: 8.7 mg/dL — ABNORMAL LOW (ref 8.9–10.3)
Chloride: 105 mmol/L (ref 101–111)
Creatinine, Ser: 1.32 mg/dL — ABNORMAL HIGH (ref 0.61–1.24)
GFR calc Af Amer: 58 mL/min — ABNORMAL LOW (ref >60)
GFR calc non Af Amer: 50 mL/min — ABNORMAL LOW (ref >60)
Glucose, Bld: 176 mg/dL — ABNORMAL HIGH (ref 65–99)
Potassium: 3.8 mmol/L (ref 3.5–5.1)
Sodium: 138 mmol/L (ref 135–145)

## 2015-10-07 LAB — CBC
HCT: 34.5 % — ABNORMAL LOW (ref 39.0–52.0)
Hemoglobin: 11.1 g/dL — ABNORMAL LOW (ref 13.0–17.0)
MCH: 28.8 pg (ref 26.0–34.0)
MCHC: 32.2 g/dL (ref 30.0–36.0)
MCV: 89.6 fL (ref 78.0–100.0)
Platelets: 125 10*3/uL — ABNORMAL LOW (ref 150–400)
RBC: 3.85 MIL/uL — ABNORMAL LOW (ref 4.22–5.81)
RDW: 13 % (ref 11.5–15.5)
WBC: 6 10*3/uL (ref 4.0–10.5)

## 2015-10-07 LAB — GLUCOSE, CAPILLARY: Glucose-Capillary: 141 mg/dL — ABNORMAL HIGH (ref 65–99)

## 2015-10-07 MED ORDER — METFORMIN HCL ER 500 MG PO TB24: 500.0000 mg | ORAL_TABLET | Freq: Every day | ORAL | Status: DC

## 2015-10-07 MED ORDER — AMLODIPINE BESYLATE 2.5 MG PO TABS: 2.5000 mg | ORAL_TABLET | Freq: Every day | ORAL | Status: DC

## 2015-10-07 MED ORDER — CLOPIDOGREL BISULFATE 75 MG PO TABS: 75.0000 mg | ORAL_TABLET | Freq: Every day | ORAL | Status: DC

## 2015-10-07 MED ORDER — ATORVASTATIN CALCIUM 40 MG PO TABS: 40.0000 mg | ORAL_TABLET | Freq: Every day | ORAL | Status: DC

## 2015-10-07 NOTE — Progress Notes (Signed)
CARDIAC REHAB PHASE I   PRE:  Rate/Rhythm: 81 SR    BP: sitting 131/79    SaO2:   MODE:  Ambulation: 1000 ft   POST:  Rate/Rhythm: 102 ST    BP: sitting 183/87     SaO2:   Tolerated well with quick pace. Able to walk much farther than he has been able to lately. SOB toward end but he was talking and walking fast. Ed completed with pt and wife. Voiced understanding. Will send referral for CRPII to [place]. Understands importance of Plavix. [id number]   Josephina Shih [place] CES, ACSM 10/07/2015 8:56 AM

## 2015-10-07 NOTE — Discharge Summary (Signed)
Discharge Summary    Patient ID: Adrian Singh,  MRN: [id number], DOB/AGE: 03-01-1934 82 y.o.  Admit date: 10/06/2015 Discharge date: 10/07/2015  Primary Care Provider: Viviana Simpler Primary Cardiologist: Dr. Martinique  Discharge Diagnoses    Active Problems:   Essential hypertension   Atherosclerotic heart disease of native coronary artery with angina pectoris ([place])   Type 2 diabetes, controlled, with peripheral neuropathy ([place])   Chronic kidney disease, stage III (moderate)   PAF (paroxysmal atrial fibrillation) (HCC)   Atherosclerosis of native coronary artery of native heart with angina pectoris (HCC)   Angina pectoris (HCC)   Allergies Allergies  Allergen Reactions  . Flomax [Tamsulosin Hcl] Other (See Comments)    Dizziness-moderate in nature.  . Doxazosin Other (See Comments)    dizziness    Diagnostic Studies/Procedures    LHC 10/06/15 Procedures    Coronary Stent Intervention    Conclusion     Mid LAD lesion, 30% stenosed.  Mid Cx to Dist Cx lesion, 99% stenosed. Post intervention, there is a 0% residual stenosis.  Successful stenting of the distal LCx with DES          History of Present Illness /Hospital Course     82 y/o male, followed by Dr. Martinique, with h/o nonobstructive CAD by cath in 2009, PAF with CVA in 2015, now on chronic anticoagulation with Xarelto, admitted for elective LHC in the setting of recent development of exertional angina and an abnormal NST showing a large inferolateral defect consistent with infarct. He [facility] to [facility] 10[facility] for the planned procedure, performed by Dr. Martinique. He was found to have a mid LCx lesion that wa 99% stenosed successfully treated with PCI +DES. There was residual 30% mid LAD stenosis to be treated medically. EF normal by stress test at 51%. He tolerated the procedure well and had no recurrent CP. He was continued on ASA. Plavix was added for his stent. His Xarelto was also continued  post cath (cath site remained stable). He will be treated with tripple therapy for 1 month, then will discontinue ASA and continue Plavix and Xarelto. He was also continued on his BB and ACE-I. He had issues with elevated BPs post cath. Given his CKD, decision was made not to increase his ACE-I. Decision was made to also discontinue his Maxide. 2.5 mg of amlodipine was prescribed for additional BP coverage. Also given abnormal LVLD, 40 mg of Lipitor was added. He will need f/u FLP and HFTs in 6 weeks. Also of note, his HgbA1c was abnormal at 8. For this reason, a f/u appointment was made with his PCP on 1/25 for DM management. He was instructed to hold metformin for 48 hrs. He was last seen and examined by Dr. Radford Pax who determined he was stable for discharge home. Post hospital f/u has been arranged in our office on 10/14/15.     Consultants: none  _____________  Discharge Vitals Blood pressure 131/79, pulse 74, temperature 97.7 F (36.5 C), temperature source Oral, resp. rate 13, height 5\' 10"  (1.778 m), weight 217 lb 6 oz (98.6 kg), SpO2 97 %.  Filed Weights   10/06/15 0555 10/07/15 0200  Weight: 218 lb (98.884 kg) 217 lb 6 oz (98.6 kg)    Labs & Radiologic Studies     CBC  Recent Labs  10/05/15 1047 10/07/15 0347  WBC 5.9 6.0  NEUTROABS 3.7  --   HGB 13.0 11.1*  HCT 38.2* 34.5*  MCV 87.8 89.6  PLT  156 0000000*   Basic Metabolic Panel  Recent Labs  10/05/15 1047 10/07/15 0347  NA 138 138  K 4.1 3.8  CL 102 105  CO2 24 25  GLUCOSE 269* 176*  BUN 17 16  CREATININE 1.57* 1.32*  CALCIUM 9.5 8.7*   Liver Function Tests No results for input(s): AST, ALT, ALKPHOS, BILITOT, PROT, ALBUMIN in the last 72 hours. No results for input(s): LIPASE, AMYLASE in the last 72 hours. Cardiac Enzymes No results for input(s): CKTOTAL, CKMB, CKMBINDEX, TROPONINI in the last 72 hours. BNP Invalid input(s): POCBNP D-Dimer No results for input(s): DDIMER in the last 72 hours. Hemoglobin  A1C No results for input(s): HGBA1C in the last 72 hours. Fasting Lipid Panel No results for input(s): CHOL, HDL, LDLCALC, TRIG, CHOLHDL, LDLDIRECT in the last 72 hours. Thyroid Function Tests No results for input(s): TSH, T4TOTAL, T3FREE, THYROIDAB in the last 72 hours.  Invalid input(s): FREET3  No results found.  Disposition   Pt is being discharged home today in good condition.  Follow-up Plans & Appointments    Follow-up Information    Follow up with Viviana Simpler, MD On 10/12/2015.   Specialties:  Internal Medicine, Pediatrics   Why:  9:00 am for follow-up for your diabetes   Contact information:   [place] [address] [phone]       Follow up with Eileen Stanford, PA-C.   Specialties:  Cardiology, Radiology   Why:  9:30 am Cardiology f/u (Dr. Doug Sou PA)   Contact information:   [place] [id number] [phone]      Discharge Instructions    AMB Referral to Cardiac Rehabilitation - Phase II    Complete by:  As directed   Diagnosis:  PCI     Amb Referral to Cardiac Rehabilitation    Complete by:  As directed   Diagnosis:  PCI     Diet - low sodium heart healthy    Complete by:  As directed      Diet - low sodium heart healthy    Complete by:  As directed      Driving Restrictions    Complete by:  As directed   No driving for 3 days     Increase activity slowly    Complete by:  As directed      Increase activity slowly    Complete by:  As directed   No heavy lifting for 1 week.           Discharge Medications   Discharge Medication List as of 10/07/2015 11:29 AM    START taking these medications   Details  amLODipine (NORVASC) 2.5 MG tablet Take 1 tablet (2.5 mg total) by mouth daily., Starting 10/07/2015, Until Discontinued, Normal    atorvastatin (LIPITOR) 40 MG tablet Take 1 tablet (40 mg total) by mouth daily., Starting 10/07/2015, Until Discontinued, Normal    clopidogrel  (PLAVIX) 75 MG tablet Take 1 tablet (75 mg total) by mouth daily with breakfast., Starting 10/07/2015, Until Discontinued, Normal      CONTINUE these medications which have CHANGED   Details  metFORMIN (GLUCOPHAGE-XR) 500 MG 24 hr tablet Take 1 tablet (500 mg total) by mouth daily with breakfast., Starting 10/09/2015, Until Discontinued, No Print      CONTINUE these medications which have NOT CHANGED   Details  B Complex Vitamins (VITAMIN B COMPLEX) TABS Take 1 tablet by mouth 3 (three) times a week. , Until  Discontinued, Historical Med    Cholecalciferol (VITAMIN D) 2000 UNITS CAPS Take 2,000 Units by mouth daily. , Until Discontinued, Historical Med    finasteride (PROSCAR) 5 MG tablet TAKE 1 TABLET (5 MG TOTAL) BY MOUTH DAILY., Normal    glimepiride (AMARYL) 4 MG tablet Take 4 mg by mouth daily with breakfast., Until Discontinued, Historical Med    glucose blood test strip 1 each by Other route as needed (TO CHECK SUGAR). Use as instructed, Until Discontinued, Historical Med    LANCETS ULTRA THIN MISC 1 strip by Does not apply route daily., Until Discontinued, Historical Med    metoprolol succinate (TOPROL-XL) 50 MG 24 hr tablet TAKE 1 TABLET EVERY DAY, Normal    Multiple Vitamin (MULTIVITAMIN WITH MINERALS) TABS tablet Take 1 tablet by mouth daily., Until Discontinued, Historical Med    nitroGLYCERIN (NITROSTAT) 0.4 MG SL tablet Place 1 tablet (0.4 mg total) under the tongue every 5 (five) minutes as needed for chest pain., Starting 09/28/2015, Until Discontinued, Normal    pantoprazole (PROTONIX) 40 MG tablet Take 40 mg by mouth daily., Until Discontinued, Historical Med    ramipril (ALTACE) 2.5 MG capsule TAKE 1 CAPSULE (2.5 MG TOTAL) BY MOUTH DAILY., Historical Med    vitamin B-12 (CYANOCOBALAMIN) 1000 MCG tablet Take 1,000 mcg by mouth daily., Until Discontinued, Historical Med    XARELTO 20 MG TABS tablet TAKE 1 TABLET BY MOUTH DAILY WITH SUPPER., Historical Med    zoster  vaccine live, PF, (ZOSTAVAX) 91478 UNT/0.65ML injection Inject 19,400 Units into the skin once., Starting 09/30/2015, Normal    aspirin 81 MG tablet Take 81 mg by mouth daily., Until Discontinued, Historical Med      STOP taking these medications     triamterene-hydrochlorothiazide (MAXZIDE-25) 37.5-25 MG tablet      traMADol (ULTRAM) 50 MG tablet          Aspirin prescribed at discharge?  Yes High Intensity Statin Prescribed? (Lipitor 40-80mg  or Crestor 20-40mg ): Yes Beta Blocker Prescribed? Yes For EF 45% or less, Was ACEI/ARB Prescribed? Yes ADP Receptor Inhibitor Prescribed? (i.e. Plavix etc.-Includes Medically Managed Patients): Yes For EF <40%, Aldosterone Inhibitor Prescribed? No:  Was EF assessed during THIS hospitalization? Yes Was Cardiac Rehab II ordered? (Included Medically managed Patients): No:    Outstanding Labs/Studies   Needs F/u LFTs and HFTs in 6 weeks  Duration of Discharge Encounter   Greater than 30 minutes including physician time.  Alveta Heimlich, Adasha Boehme NP 10/07/2015, 4:41 PM

## 2015-10-07 NOTE — Progress Notes (Signed)
Patient Profile: 82 y/o male, followed by Dr. Martinique, with h/o nonobstructive CAD by cath in 2009, PAF with CVA in 2015, now on chronic anticoagulation with Xarelto, admitted for elective LHC in the setting of recent development of exertional angina and an abnormal NST showing a large inferolateral defect consistent with infarct.   Subjective: Doing well. No recurrent CP. ambulating w/o difficulty.   Objective: Vital signs in last 24 hours: Temp:  [97.7 F (36.5 C)-98 F (36.7 C)] 97.8 F (36.6 C) (01/20 0200) Pulse Rate:  [56-69] 69 (01/20 0200) Resp:  [8-21] 13 (01/20 0200) BP: (120-183)/(61-85) 150/79 mmHg (01/20 0200) SpO2:  [96 %-100 %] 96 % (01/20 0200) Weight:  [217 lb 6 oz (98.6 kg)] 217 lb 6 oz (98.6 kg) (01/20 0200)    Intake/Output from previous day: 01/19 0701 - 01/20 0700 In: 1426.8 [P.O.:240; I.V.:1186.8] Out: 1950 [Urine:1950] Intake/Output this shift:    Medications Current Facility-Administered Medications  Medication Dose Route Frequency Provider Last Rate Last Dose  . 0.9 %  sodium chloride infusion  250 mL Intravenous PRN Peter M Martinique, MD      . acetaminophen (TYLENOL) tablet 650 mg  650 mg Oral Q4H PRN Peter M Martinique, MD      . aspirin EC tablet 81 mg  81 mg Oral Daily Peter M Martinique, MD   81 mg at 10/07/15 0818  . clopidogrel (PLAVIX) tablet 75 mg  75 mg Oral Q breakfast Peter M Martinique, MD   75 mg at 10/07/15 0818  . finasteride (PROSCAR) tablet 5 mg  5 mg Oral Daily Peter M Martinique, MD   5 mg at 10/06/15 1723  . glimepiride (AMARYL) tablet 4 mg  4 mg Oral Q breakfast Peter M Martinique, MD   4 mg at 10/07/15 [id number]  . insulin aspart (novoLOG) injection 0-15 Units  0-15 Units Subcutaneous TID WC Eileen Stanford, PA-C      . insulin aspart (novoLOG) injection 0-5 Units  0-5 Units Subcutaneous QHS Eileen Stanford, PA-C   0 Units at 10/06/15 2256  . metoprolol succinate (TOPROL-XL) 24 hr tablet 50 mg  50 mg Oral Daily Peter M Martinique, MD   50 mg at  10/07/15 [id number]  . multivitamin with minerals tablet 1 tablet  1 tablet Oral Daily Peter M Martinique, MD   1 tablet at 10/07/15 [id number]  . nitroGLYCERIN (NITROSTAT) SL tablet 0.4 mg  0.4 mg Sublingual Q5 min PRN Peter M Martinique, MD      . ondansetron [facility]) injection 4 mg  4 mg Intravenous Q6H PRN Peter M Martinique, MD      . pantoprazole (PROTONIX) EC tablet 40 mg  40 mg Oral Daily Peter M Martinique, MD   40 mg at 10/07/15 [id number]  . ramipril (ALTACE) capsule 2.5 mg  2.5 mg Oral Daily Peter M Martinique, MD   2.5 mg at 10/06/15 [id number]  . sodium chloride 0.9 % injection 3 mL  3 mL Intravenous Q12H Peter M Martinique, MD      . sodium chloride 0.9 % injection 3 mL  3 mL Intravenous PRN Peter M Martinique, MD      . traMADol Veatrice Bourbon) tablet 50 mg  50 mg Oral Q6H PRN Peter M Martinique, MD   50 mg at 10/06/15 2104    PE: General appearance: alert, cooperative and no distress Neck: no carotid bruit and no JVD Lungs: clear to auscultation bilaterally Heart: regular rate and rhythm, S1, S2 normal, no murmur, click, rub  or gallop Extremities: no LEE Pulses: 2+ and symmetric Skin: warm and dry Neurologic: Grossly normal  Lab Results:   Recent Labs  10/05/15 1047 10/07/15 0347  WBC 5.9 6.0  HGB 13.0 11.1*  HCT 38.2* 34.5*  PLT 156 125*   BMET  Recent Labs  10/05/15 1047 10/07/15 0347  NA 138 138  K 4.1 3.8  CL 102 105  CO2 24 25  GLUCOSE 269* 176*  BUN 17 16  CREATININE 1.57* 1.32*  CALCIUM 9.5 8.7*   PT/INR  Recent Labs  10/05/15 1047  LABPROT 15.1  INR 1.17    Studies/Results:  Procedures    Coronary Stent Intervention    Conclusion     Mid LAD lesion, 30% stenosed.  Mid Cx to Dist Cx lesion, 99% stenosed. Post intervention, there is a 0% residual stenosis.  Successful stenting of the distal LCx with DES     Assessment/Plan  Active Problems:   Essential hypertension   Atherosclerotic heart disease of native coronary artery with angina pectoris (HCC)   Type 2 diabetes,  controlled, with peripheral neuropathy (HCC)   Chronic kidney disease, stage III (moderate)   PAF (paroxysmal atrial fibrillation) (HCC)   Atherosclerosis of native coronary artery of native heart with angina pectoris ([place])   Angina pectoris ([place])   1. Exertional Angina/ Abnormal NST: LHC performed 10/05/14. Obstructive CAD diagnosed. Per below.  2. CAD: Patient found to have 99% mid to distal LCx stenosis, successfully treated with PCI +DES. There was residual 30% mid LAD stenosis to be treated medically. EF normal by stress test at 51%. Stable w/o recurrent CP.  Continue DAPT with ASA + Plavix, BB, ACE-I.   3. HTN: mildly elevated. Continue home meds: metoprolol and Altace. Can titrate meds in the out patient setting.   4. PAF: rate controlled on metoprolol. Cath site is stable. Resume Xarelto for a/c.  5. HLD: Lipid panel 03/2015 showed elevated LVLD at 74. TG elevated at 337. No contraindications for statins. Will add 40 mg of Lipitor. Recheck FLP + HFTs in 6 weeks.   5. Post Cath: renal function stable. Left radial access site is stable.   Myan Suit M. Rosita Fire, PA-C 10/07/2015 8:42 AM

## 2015-10-12 ENCOUNTER — Encounter: Payer: Self-pay | Admitting: Internal Medicine

## 2015-10-12 ENCOUNTER — Ambulatory Visit (INDEPENDENT_AMBULATORY_CARE_PROVIDER_SITE_OTHER): Payer: Medicare Other | Admitting: Internal Medicine

## 2015-10-12 VITALS — BP 136/70 | HR 85 | Temp 98.3°F | Wt 219.0 lb

## 2015-10-12 DIAGNOSIS — E1165 Type 2 diabetes mellitus with hyperglycemia: Secondary | ICD-10-CM

## 2015-10-12 DIAGNOSIS — N400 Enlarged prostate without lower urinary tract symptoms: Secondary | ICD-10-CM

## 2015-10-12 DIAGNOSIS — IMO0002 Reserved for concepts with insufficient information to code with codable children: Secondary | ICD-10-CM

## 2015-10-12 DIAGNOSIS — I25119 Atherosclerotic heart disease of native coronary artery with unspecified angina pectoris: Secondary | ICD-10-CM | POA: Diagnosis not present

## 2015-10-12 DIAGNOSIS — E1140 Type 2 diabetes mellitus with diabetic neuropathy, unspecified: Secondary | ICD-10-CM

## 2015-10-12 LAB — RENAL FUNCTION PANEL
Albumin: 3.9 g/dL (ref 3.5–5.2)
BUN: 16 mg/dL (ref 6–23)
CO2: 28 mEq/L (ref 19–32)
Calcium: 9.1 mg/dL (ref 8.4–10.5)
Chloride: 104 mEq/L (ref 96–112)
Creatinine, Ser: 1.34 mg/dL (ref 0.40–1.50)
GFR: 54.63 mL/min — ABNORMAL LOW (ref >60.00)
Glucose, Bld: 257 mg/dL — ABNORMAL HIGH (ref 70–99)
Phosphorus: 2.2 mg/dL — ABNORMAL LOW (ref 2.3–4.6)
Potassium: 4.1 mEq/L (ref 3.5–5.1)
Sodium: 140 mEq/L (ref 135–145)

## 2015-10-12 MED ORDER — TAMSULOSIN HCL 0.4 MG PO CAPS: 0.4000 mg | ORAL_CAPSULE | Freq: Every day | ORAL | Status: DC

## 2015-10-12 MED ORDER — BAYER CONTOUR NEXT EZ W/DEVICE KIT: PACK | Status: DC

## 2015-10-12 NOTE — Assessment & Plan Note (Signed)
Worse again Will try tamsulosin again--hopefully affordable for him this year

## 2015-10-12 NOTE — Progress Notes (Signed)
Subjective:    Patient ID: Adrian Singh, male    DOB: 1934-01-03, 82 y.o.   MRN: [id number]  HPI Here for hospital follow up  Had PCI and tolerated well He feels his energy levels are much better Not getting the DOE like he was Still notes heart rate speed up with exercise--but relates that to deconditioning  Did restart the metformin Sugars are running high still--over 200 in AM  Current Outpatient Prescriptions on File Prior to Visit  Medication Sig Dispense Refill  . amLODipine (NORVASC) 2.5 MG tablet Take 1 tablet (2.5 mg total) by mouth daily. 30 tablet 5  . aspirin 81 MG tablet Take 81 mg by mouth daily.    Marland Kitchen atorvastatin (LIPITOR) 40 MG tablet Take 1 tablet (40 mg total) by mouth daily. 30 tablet 5  . B Complex Vitamins (VITAMIN B COMPLEX) TABS Take 1 tablet by mouth 3 (three) times a week.     . Cholecalciferol (VITAMIN D) 2000 UNITS CAPS Take 2,000 Units by mouth daily.     . clopidogrel (PLAVIX) 75 MG tablet Take 1 tablet (75 mg total) by mouth daily with breakfast. 30 tablet 5  . finasteride (PROSCAR) 5 MG tablet TAKE 1 TABLET (5 MG TOTAL) BY MOUTH DAILY. 90 tablet 3  . glimepiride (AMARYL) 4 MG tablet Take 4 mg by mouth daily with breakfast.    . glucose blood test strip 1 each by Other route as needed (TO CHECK SUGAR). Use as instructed    . LANCETS ULTRA THIN MISC 1 strip by Does not apply route daily.    . metFORMIN (GLUCOPHAGE-XR) 500 MG 24 hr tablet Take 1 tablet (500 mg total) by mouth daily with breakfast.  1  . metoprolol succinate (TOPROL-XL) 50 MG 24 hr tablet TAKE 1 TABLET EVERY DAY 90 tablet 3  . Multiple Vitamin (MULTIVITAMIN WITH MINERALS) TABS tablet Take 1 tablet by mouth daily.    . nitroGLYCERIN (NITROSTAT) 0.4 MG SL tablet Place 1 tablet (0.4 mg total) under the tongue every 5 (five) minutes as needed for chest pain. 25 tablet 3  . pantoprazole (PROTONIX) 40 MG tablet Take 40 mg by mouth daily.    . ramipril (ALTACE) 2.5 MG capsule TAKE 1 CAPSULE  (2.5 MG TOTAL) BY MOUTH DAILY.  3  . vitamin B-12 (CYANOCOBALAMIN) 1000 MCG tablet Take 1,000 mcg by mouth daily.    [name] 20 MG TABS tablet TAKE 1 TABLET BY MOUTH DAILY WITH SUPPER.  5   No current facility-administered medications on file prior to visit.    Allergies  Allergen Reactions  . Flomax [Tamsulosin Hcl] Other (See Comments)    Dizziness-moderate in nature.  . Doxazosin Other (See Comments)    dizziness    Past Medical History  Diagnosis Date  . Hypertrophy of prostate without urinary obstruction and other lower urinary tract symptoms (LUTS)   . Esophageal reflux   . Unspecified essential hypertension   . Plantar wart   . Tachycardia, unspecified   . Orthostatic lightheadedness   . Atrial fibrillation ([place])     x1  . CAD (coronary artery disease)     nonobstructive  . Thrombocytopenia ([place])   . Shortness of breath dyspnea     with exertion  . Headache   . Complication of anesthesia     "03/2015 didn't get sick but lost my appetite for a couple months" (10/06/2015)  . Type II diabetes mellitus ([place])   . Heart murmur   . Stroke (  [place]) 03/2014    "had a series of mini strokes; maybe 4"; denies residual on 10/06/2015  . Osteoarthrosis, unspecified whether generalized or localized, unspecified site   . Arthritis     "legs, back" (10/06/2015)  . Chronic kidney disease, stage III (moderate)   . Kidney stones   . 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 Esophagogastroduodenoscopy (egd) with esophageal dilation  2001    with dilation  . Back surgery    . Shoulder arthroscopy w/ rotator cuff repair Left 2005  . Cardiac catheterization  12/2007  . Colonoscopy w/ biopsies and polypectomy    . Ear cyst excision N/A 04/06/2015    Procedure: EXCISION OF SCALP CYST;  Surgeon: Donnie Mesa, MD;  Location: [facility];  Service: General;  Laterality: N/A;  . Cardiac catheterization N/A  09/22/2015    Procedure: Left Heart Cath and Coronary Angiography;  Surgeon: Peter M Martinique, MD;  Location: [facility];  Service: Cardiovascular;  Laterality: N/A;  . Cardiac catheterization N/A 10/06/2015    Procedure: Coronary Stent Intervention;  Surgeon: Peter M Martinique, MD;  Location: [facility];  Service: Cardiovascular;  Laterality: N/A;  . Lumbar disc surgery  09/26/1999    "cleaned out arthritis and bone spurs"  . Coronary stent placement  10/06/2015    [name]  with DES    Family History  Problem Relation Age of Onset  . Stroke Father   . Peripheral vascular disease Father     amputation  . Heart failure Mother     CHF  . Coronary artery disease Mother   . Heart attack Mother     Multiple  . COPD Brother   . Heart disease Brother   . Cancer Brother     ?  . Lung cancer Sister     Social History   Social History  . Marital Status: Married    Spouse Name: N/A  . Number of Children: 4  . Years of Education: N/A   Occupational History  . 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 Smoking status: Former Smoker -- 3 years    Types: Cigarettes  . Smokeless tobacco: Never Used     Comment: " Quit smoking by age 45; was a someday smoker "  . Alcohol Use: No  . Drug Use: No  . Sexual Activity: No   Other Topics Concern  . Not on file   Social History Narrative   No living will   Plans wife and then children to make health care decisions for him if unable   Would request at least attempts at resuscitation but no prolonged life support   Doesn't think he would want tube feeds if cognitively unaware   Review of Systems  Sleeping fine Appetite is good--trying to be careful     Objective:   Physical Exam  Constitutional: He appears well-developed and well-nourished. No distress.  Neck: Normal range of motion. Neck supple. No thyromegaly present.  Cardiovascular: Normal rate, regular rhythm and normal heart sounds.  Exam reveals no  gallop.   No murmur heard. Pulmonary/Chest: Effort normal and breath sounds normal. No respiratory distress. He has no wheezes. He has no rales.  Musculoskeletal: He exhibits no edema.          Assessment & Plan:

## 2015-10-12 NOTE — Assessment & Plan Note (Addendum)
Marked improvement in symptoms since PCI Needs to increase his exercise now that he can Will recheck renal function

## 2015-10-12 NOTE — Assessment & Plan Note (Signed)
Sugars up now A1c only 8.2% Will have him increase exercise and no change in meds for now

## 2015-10-12 NOTE — Addendum Note (Signed)
Addended by: Despina Hidden on: 10/12/2015 09:48 AM   Modules accepted: Orders, Medications

## 2015-10-12 NOTE — Progress Notes (Signed)
Pre visit review using our clinic review tool, if applicable. No additional management support is needed unless otherwise documented below in the visit note. 

## 2015-10-13 NOTE — Progress Notes (Signed)
Cardiology Office Note   Date:  10/14/2015   ID:  ANANTH FIALLOS, DOB Mar 04, 1934, MRN [id number]  PCP:  Viviana Simpler, MD  Cardiologist:   Dr. Martinique   Post hospital f/u- s/p DES to LCx   History of Present Illness: OKLEY MAGNUSSEN is a 82 y.o. male with a history of nonobstructive CAD by cath in 2009, orthostatic hypotension, PAF with CVA in 2015, now on chronic anticoagulation with Xarelto, CKD, HTN and HLD who presents to clinic after recent admission for [facility] s/p DES to LCx   He was seen in the office on 08/10/2015 with shortness of breath with exertion. He had outpatient stress test done on 08/25/2015 which showed EF mildly decreased 45-54%, no significant ST elevation or depression during stress portion, overall considered low risk study, however has prior myocardial infarction with peri-infarct ischemia. The peri-infarct ischemia was felt to be new compared to the previous stress test, therefore outpatient cardiac catheterization was arranged. He underwent planned study on 09/22/2015 which showed 30% mid LAD lesion, 99% mid to distal left circumflex lesion, 50% OM 3 lesion. Due to poor support from right radial approach and concern for renal function, it was planned for staged PCI at a later time.   He was admitted to [facility] on 10/06/15 for staged PCI/DES to mid LCx lesion that wa 99% stenosed. There was residual 30% mid LAD stenosis to be treated medically.  He tolerated the procedure well and had no recurrent CP. He was continued on ASA. Plavix was added for his stent. His Xarelto was also continued post cath (cath site remained stable). He will be treated with tripple therapy for 1 month, then will discontinue ASA and continue Plavix and Xarelto. He was also continued on his BB and ACE-I. He had issues with elevated BPs post cath. Given his CKD, decision was made not to increase his ACE-I. Decision was made to also discontinue his Maxide. 2.5 mg of amlodipine was prescribed for additional BP  coverage. Also given abnormal LVLD, 40 mg of Lipitor was added.  Today he presents for follow up. He has been doing quite well since his procedure. He has been walking quite a bit and doing doing arm/leg exercises without any issues. Since the stent placement his SOB has almost completely resolved. No CP. No Le edema, orthopnea or PND. No dizziness or passing out. No blood in stool or urine.    Past Medical History  Diagnosis Date  . Hypertrophy of prostate without urinary obstruction and other lower urinary tract symptoms (LUTS)   . Esophageal reflux   . Unspecified essential hypertension   . Plantar wart   . Tachycardia, unspecified   . Orthostatic lightheadedness   . Atrial fibrillation ([place])     x1  . CAD (coronary artery disease)     nonobstructive  . Thrombocytopenia ([place])   . Shortness of breath dyspnea     with exertion  . Headache   . Complication of anesthesia     "03/2015 didn't get sick but lost my appetite for a couple months" (10/06/2015)  . Type II diabetes mellitus ([place])   . Heart murmur   . Stroke [facility]) 03/2014    "had a series of mini strokes; maybe 4"; denies residual on 10/06/2015  . Osteoarthrosis, unspecified whether generalized or localized, unspecified site   . Arthritis     "legs, back" (10/06/2015)  . Chronic kidney disease, stage III (moderate)   . Kidney stones   . Cancer (  [place])     skin cancer on ear (froze it off) and back (cut it off)    Past Surgical History  Procedure Laterality Date  . Esophagogastroduodenoscopy (egd) with esophageal dilation  2001    with dilation  . Back surgery    . Shoulder arthroscopy w/ rotator cuff repair Left 2005  . Cardiac catheterization  12/2007  . Colonoscopy w/ biopsies and polypectomy    . Ear cyst excision N/A 04/06/2015    Procedure: EXCISION OF SCALP CYST;  Surgeon: Donnie Mesa, MD;  Location: [facility];  Service: General;  Laterality: N/A;  . Cardiac catheterization N/A 09/22/2015    Procedure: Left Heart Cath  and Coronary Angiography;  Surgeon: Peter M Martinique, MD;  Location: [facility];  Service: Cardiovascular;  Laterality: N/A;  . Cardiac catheterization N/A 10/06/2015    Procedure: Coronary Stent Intervention;  Surgeon: Peter M Martinique, MD;  Location: [facility];  Service: Cardiovascular;  Laterality: N/A;  . Lumbar disc surgery  09/26/1999    "cleaned out arthritis and bone spurs"  . Coronary stent placement  10/06/2015    [name]  with DES     Current Outpatient Prescriptions  Medication Sig Dispense Refill  . amLODipine (NORVASC) 2.5 MG tablet Take 1 tablet (2.5 mg total) by mouth daily. 30 tablet 5  . aspirin 81 MG tablet Take 81 mg by mouth daily.    Marland Kitchen atorvastatin (LIPITOR) 40 MG tablet Take 1 tablet (40 mg total) by mouth daily. 30 tablet 5  . B Complex Vitamins (VITAMIN B COMPLEX) TABS Take 1 tablet by mouth 3 (three) times a week.     . Blood Glucose Monitoring Suppl (CONTOUR NEXT EZ MONITOR) w/Device KIT Use to test blood sugar once daily dx: E11.40 1 kit 0  . Cholecalciferol (VITAMIN D) 2000 UNITS CAPS Take 2,000 Units by mouth daily.     . clopidogrel (PLAVIX) 75 MG tablet Take 1 tablet (75 mg total) by mouth daily with breakfast. 30 tablet 5  . finasteride (PROSCAR) 5 MG tablet TAKE 1 TABLET (5 MG TOTAL) BY MOUTH DAILY. 90 tablet 3  . glimepiride (AMARYL) 4 MG tablet Take 4 mg by mouth daily with breakfast.    . glucose blood test strip 1 each by Other route as needed (TO CHECK SUGAR). Use as instructed    . LANCETS ULTRA THIN MISC 1 strip by Does not apply route daily.    . metFORMIN (GLUCOPHAGE-XR) 500 MG 24 hr tablet Take 1 tablet (500 mg total) by mouth daily with breakfast.  1  . metoprolol succinate (TOPROL-XL) 50 MG 24 hr tablet TAKE 1 TABLET EVERY DAY 90 tablet 3  . Multiple Vitamin (MULTIVITAMIN WITH MINERALS) TABS tablet Take 1 tablet by mouth daily.    . nitroGLYCERIN (NITROSTAT) 0.4 MG SL tablet Place 1 tablet (0.4 mg total) under the tongue every 5 (five)  minutes as needed for chest pain. 25 tablet 3  . pantoprazole (PROTONIX) 40 MG tablet Take 40 mg by mouth daily.    . ramipril (ALTACE) 2.5 MG capsule TAKE 1 CAPSULE (2.5 MG TOTAL) BY MOUTH DAILY.  3  . tamsulosin (FLOMAX) 0.4 MG CAPS capsule Take 1 capsule (0.4 mg total) by mouth daily. 90 capsule 3  . XARELTO 20 MG TABS tablet TAKE 1 TABLET BY MOUTH DAILY WITH SUPPER.  5   No current facility-administered medications for this visit.    Allergies:   Doxazosin    Social History:  The patient  reports that he has quit smoking. His smoking use included Cigarettes. He quit after 3 years of use. He has never used smokeless tobacco. He reports that he does not drink alcohol or use illicit drugs.   Family History:  The patient's family history includes COPD in his brother; Cancer in his brother; Coronary artery disease in his mother; Heart attack in his mother; Heart disease in his brother; Heart failure in his mother; Lung cancer in his sister; Peripheral vascular disease in his father; Stroke in his father.    ROS:  Please see the history of present illness.   Otherwise, review of systems are positive for none.   All other systems are reviewed and negative.    PHYSICAL EXAM: VS:  BP 160/82 mmHg  Pulse 80  Ht '5\' 10"'  (1.778 m)  Wt 220 lb (99.791 kg)  BMI 31.57 kg/m2 , BMI Body mass index is 31.57 kg/(m^2). GEN: Well nourished, well developed, in no acute distress HEENT: normal Neck: no JVD, carotid bruits, or masses Cardiac: RRR; no murmurs, rubs, or gallops,no edema  Respiratory:  clear to auscultation bilaterally, normal work of breathing GI: soft, nontender, nondistended, + BS MS: no deformity or atrophy Skin: warm and dry, no rash Neuro:  Strength and sensation are intact Psych: euthymic mood, full affect   EKG:  EKG is ordered today. The ekg ordered today demonstrates NSR HR 72 with inf infarct by Q waves   Recent Labs: 03/28/2015: ALT 26 10/07/2015: Hemoglobin 11.1*;  Platelets 125* 10/12/2015: BUN 16; Creatinine, Ser 1.34; Potassium 4.1; Sodium 140    Lipid Panel    Component Value Date/Time   CHOL 135 03/28/2015 1133   TRIG 337.0* 03/28/2015 1133   HDL 30.70* 03/28/2015 1133   CHOLHDL 4 03/28/2015 1133   VLDL 67.4* 03/28/2015 1133   LDLCALC 47 04/01/2014 0556   LDLDIRECT 77.0 03/28/2015 1133      Wt Readings from Last 3 Encounters:  10/14/15 220 lb (99.791 kg)  10/12/15 219 lb (99.338 kg)  10/07/15 217 lb 6 oz (98.6 kg)      Other studies Reviewed: Additional studies/ records that were reviewed today include: LHC Review of the above records demonstrates:   [facility] 10/06/15 Procedures    Coronary Stent Intervention    Conclusion     Mid LAD lesion, 30% stenosed.  Mid Cx to Dist Cx lesion, 99% stenosed. Post intervention, there is a 0% residual stenosis.  Successful stenting of the distal LCx with DES              ASSESSMENT AND PLAN:  LEAM MADERO is a 82 y.o. male with a history of nonobstructive CAD by cath in 2009, orthostatic hypotension, PAF with CVA in 2015, now on chronic anticoagulation with Xarelto, CKD, HTN and HLD who presents to clinic after recent admission for [facility] s/p DES to LCx.   CAD s/p DES to LCx: continue ASA, plavix and Xarelto followed by only plavix and Xarelto after 1 month of triple therapy (drop ASA 11/06/15). Continue BB and statin  HTN: BP 160/82. Continue ramipril 2.51m daily and Toprol XL 528mdaily. Will increase Torpol Xl to 100 mg. He will check BP in a week and if still elevated will call us[place]oreaack ( we can then increase amlodipine).  PAF: today in NSR. Continue Xarelto. CHADSVASC score of at least 8. Toprol XL 5022mncreased to 100m81mily for better BP control  CKD: creat 1.34 at discharge.   HLD: continue on atorvastatin.  DMT2: his HgbA1c was abnormal at 8. For this reason, a f/u appointment was made with his PCP on 1/25 for DM management.   Current medicines are  reviewed at length with the patient today.  The patient does not have concerns regarding medicines.  The following changes have been made:  Increase Toprol XL from 48m to 1076mdaily.   Labs/ tests ordered today include:   Orders Placed This Encounter  Procedures  . EKG 12-Lead     Disposition:   FU with Dr. JoMartiniquen 3 months.   SiRenea Ee1/27/2017 9:59 AM    [facility] [address] [place]  [id number]: [phone]: [phone]

## 2015-10-14 ENCOUNTER — Ambulatory Visit (INDEPENDENT_AMBULATORY_CARE_PROVIDER_SITE_OTHER): Payer: Medicare Other | Admitting: Physician Assistant

## 2015-10-14 ENCOUNTER — Encounter: Payer: Self-pay | Admitting: Physician Assistant

## 2015-10-14 VITALS — BP 160/82 | HR 80 | Ht 70.0 in | Wt 220.0 lb

## 2015-10-14 DIAGNOSIS — I2510 Atherosclerotic heart disease of native coronary artery without angina pectoris: Secondary | ICD-10-CM | POA: Diagnosis not present

## 2015-10-14 DIAGNOSIS — Z7901 Long term (current) use of anticoagulants: Secondary | ICD-10-CM | POA: Diagnosis not present

## 2015-10-14 DIAGNOSIS — I480 Paroxysmal atrial fibrillation: Secondary | ICD-10-CM

## 2015-10-14 DIAGNOSIS — I10 Essential (primary) hypertension: Secondary | ICD-10-CM

## 2015-10-14 MED ORDER — METOPROLOL SUCCINATE ER 100 MG PO TB24: 100.0000 mg | ORAL_TABLET | Freq: Every day | ORAL | Status: DC

## 2015-10-14 NOTE — Patient Instructions (Addendum)
Medication Instructions:  Your physician has recommended you make the following change in your medication:  1.  INCREASE the Metoprolol to 100 mg taking 1 tablet daily 2.  STOP taking the Aspirin on 11/06/15  Labwork: None ordered  Testing/Procedures: None ordered  Follow-Up: Your physician recommends that you schedule a follow-up appointment in: 3 MONTHS WITH DR. Martinique   Any Other Special Instructions Will Be Listed Below (If Applicable).  Check your blood pressure 10/21/15 and it is higher 140/90, please call our office [phone] for a medication adjustment.   If you need a refill on your cardiac medications before your next appointment, please call your pharmacy.

## 2015-10-17 ENCOUNTER — Encounter: Payer: Self-pay | Admitting: *Deleted

## 2015-10-18 ENCOUNTER — Other Ambulatory Visit: Payer: Self-pay | Admitting: *Deleted

## 2015-10-18 MED ORDER — BAYER MICROLET LANCETS MISC: Status: AC

## 2015-10-18 MED ORDER — GLUCOSE BLOOD VI STRP: ORAL_STRIP | Status: DC

## 2015-10-19 ENCOUNTER — Telehealth: Payer: Self-pay

## 2015-10-19 DIAGNOSIS — E1140 Type 2 diabetes mellitus with diabetic neuropathy, unspecified: Secondary | ICD-10-CM | POA: Diagnosis not present

## 2015-10-19 MED ORDER — TAMSULOSIN HCL 0.4 MG PO CAPS: 0.4000 mg | ORAL_CAPSULE | Freq: Every day | ORAL | Status: DC

## 2015-10-19 NOTE — Telephone Encounter (Signed)
Spoke with patient and advised results that rx was resent to [facility], per pt [place] sent medication to [facility]?

## 2015-10-19 NOTE — Telephone Encounter (Signed)
Adrian Singh [phone]  [name] stop by to check on his tamsulosin (FLOMAX) 0.4 MG CAPS capsule that has been sent to the mail order pharmacy. I let him know we sent it to them on 10/12/15 and that he should be getting it anytime. Gave him there phone number, but he would like for someone to check on it.

## 2015-10-21 ENCOUNTER — Telehealth: Payer: Self-pay | Admitting: Cardiology

## 2015-10-21 NOTE — Telephone Encounter (Signed)
Returned call. Pt notes BP checked today at pharmacy was 156/84, however, he does note difficulty in getting the cuff to work. This reading was obtained after 3rd attempt & help from staff. He feels the Bp may have been a little elevated due to this.  Pt plans to recheck on Monday. Pt aware I will defer to Angelena Form for any recommendations.

## 2015-10-21 NOTE — Telephone Encounter (Signed)
Spoke to patient xarelto 20 mg samples left at front desk of [name] office.

## 2015-10-21 NOTE — Telephone Encounter (Signed)
Adrian Singh is calling to report his bp reading from today .Marland Kitchen Please call   Thanks

## 2015-10-24 NOTE — Telephone Encounter (Signed)
Did he retake his BP yesterday? Can you call and check in on this patient

## 2015-10-25 NOTE — Telephone Encounter (Signed)
Called pt this a.m., per Nell Range, PA-C, to check on pt and see if he has had his BP rechecked yesterday.  Per pt, he forgot about it and stated that he will be going out tomorrow morning and he will have it rechecked at that time and call us with the results.

## 2015-10-26 ENCOUNTER — Telehealth: Payer: Self-pay | Admitting: Cardiology

## 2015-10-26 NOTE — Telephone Encounter (Signed)
New message     Pt c/o BP issue: STAT if pt c/o blurred vision, one-sided weakness or slurred speech  1. What are your last 5 BP readings? Today 140/80 ,   2. Are you having any other symptoms (ex. Dizziness, headache, blurred vision, passed out)? No   3. What is your BP issue? 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

## 2015-10-26 NOTE — Telephone Encounter (Signed)
This is a good pressure. Thank him for calling and tell him to keep on current med regimen.

## 2015-10-26 NOTE — Telephone Encounter (Signed)
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 He had been recommended to repeat his BP check from the other day to give time for meds to work. Aware I am forwarding to Angelena Form for med dosing recommendations.

## 2015-10-27 NOTE — Telephone Encounter (Signed)
Called pt, informed of recommendations. He voiced understanding.

## 2015-11-26 ENCOUNTER — Other Ambulatory Visit: Payer: Self-pay | Admitting: Internal Medicine

## 2015-12-06 ENCOUNTER — Other Ambulatory Visit: Payer: Self-pay | Admitting: *Deleted

## 2015-12-06 MED ORDER — AMLODIPINE BESYLATE 2.5 MG PO TABS: 2.5000 mg | ORAL_TABLET | Freq: Every day | ORAL | Status: DC

## 2015-12-08 ENCOUNTER — Telehealth: Payer: Self-pay | Admitting: Cardiology

## 2015-12-08 MED ORDER — XARELTO 20 MG PO TABS: ORAL_TABLET | ORAL | Status: DC

## 2015-12-08 NOTE — Telephone Encounter (Signed)
Patient calling the office for samples of medication:   1.  What medication and dosage are you requesting samples for? Xarelto  2.  Are you currently out of this medication? Has 3 pills left

## 2015-12-08 NOTE — Telephone Encounter (Signed)
SPOKE TO PATIENT  XARELTO 20 MG  X 2 available  patient will pick up tomorrow

## 2015-12-26 ENCOUNTER — Telehealth: Payer: Self-pay | Admitting: *Deleted

## 2015-12-26 MED ORDER — XARELTO 20 MG PO TABS: ORAL_TABLET | ORAL | Status: DC

## 2015-12-26 NOTE — Telephone Encounter (Signed)
Patient calling Malachy Mood, Dr. Doug Sou nurse, to see if she is able to give him any refills of Xarelto 20mg  because is almost out of medication.   He would like a call back from Ms. Malachy Mood if possible.

## 2015-12-26 NOTE — Telephone Encounter (Signed)
Returned call to patient.Office out of xarelto 20 mg samples.Refill sent to pharmacy

## 2015-12-28 ENCOUNTER — Other Ambulatory Visit: Payer: Self-pay | Admitting: Internal Medicine

## 2015-12-28 ENCOUNTER — Other Ambulatory Visit: Payer: Self-pay | Admitting: *Deleted

## 2015-12-28 MED ORDER — RIVAROXABAN 20 MG PO TABS: 20.0000 mg | ORAL_TABLET | Freq: Every day | ORAL | Status: DC

## 2015-12-28 NOTE — Telephone Encounter (Signed)
Left refill on voice mail at pharmacy  

## 2015-12-28 NOTE — Telephone Encounter (Signed)
XARELTO 20 MG TABS tablet  Medication   Date: 12/26/2015  Department: [facility] Office  Ordering/Authorizing: Peter M Martinique, MD      Order Providers    Prescribing Provider Encounter Provider   Peter M Martinique, MD Paulla Dolly    Medication Detail      Disp Refills Start End     XARELTO 20 MG TABS tablet 30 tablet 6 12/26/2015     Sig: TAKE 1 TABLET BY MOUTH DAILY WITH SUPPER.    Class: Sample     Pharmacy    [facility] [place], [place]     Wasn't sent in due to being set as sample

## 2015-12-28 NOTE — Telephone Encounter (Signed)
Approved: #[phone] x 0 if he needs something for pain now

## 2015-12-28 NOTE — Telephone Encounter (Signed)
Tramadol is not on the current medication list. Last shows refill 10-07-15 on past medication list. Last OV 10-12-15. Next OV 04-02-16. Do you want to refill it? He has been on it intermittently for several years

## 2016-01-12 ENCOUNTER — Telehealth: Payer: Self-pay | Admitting: Cardiology

## 2016-01-12 ENCOUNTER — Encounter: Payer: Self-pay | Admitting: Cardiology

## 2016-01-12 ENCOUNTER — Ambulatory Visit (INDEPENDENT_AMBULATORY_CARE_PROVIDER_SITE_OTHER): Payer: Medicare Other | Admitting: Cardiology

## 2016-01-12 VITALS — BP 175/85 | HR 77 | Ht 70.0 in | Wt 221.6 lb

## 2016-01-12 DIAGNOSIS — N183 Chronic kidney disease, stage 3 unspecified: Secondary | ICD-10-CM

## 2016-01-12 DIAGNOSIS — E1140 Type 2 diabetes mellitus with diabetic neuropathy, unspecified: Secondary | ICD-10-CM

## 2016-01-12 DIAGNOSIS — I25119 Atherosclerotic heart disease of native coronary artery with unspecified angina pectoris: Secondary | ICD-10-CM

## 2016-01-12 DIAGNOSIS — I480 Paroxysmal atrial fibrillation: Secondary | ICD-10-CM

## 2016-01-12 DIAGNOSIS — E1165 Type 2 diabetes mellitus with hyperglycemia: Secondary | ICD-10-CM

## 2016-01-12 DIAGNOSIS — I10 Essential (primary) hypertension: Secondary | ICD-10-CM

## 2016-01-12 DIAGNOSIS — IMO0002 Reserved for concepts with insufficient information to code with codable children: Secondary | ICD-10-CM

## 2016-01-12 MED ORDER — TRIAMTERENE-HCTZ 37.5-25 MG PO TABS: 1.0000 | ORAL_TABLET | Freq: Every day | ORAL | Status: DC

## 2016-01-12 NOTE — Telephone Encounter (Signed)
ERROR  DID NOT NEED TO START NOTE

## 2016-01-12 NOTE — Progress Notes (Signed)
Adrian Singh Date of Birth: [birth year]   History of Present Illness: [name] is seen today for follow up CAD.  He has a history of nonobstructive coronary disease by cardiac catheterization 2009. He also has a history of orthostatic dizziness. He was admitted in July 0240 with an embolic stroke presumably due to paroxysmal Afib. He was anticoagulated with Eliquis. The patient developed tachycardia and HA on Eliquis that resolved when he was switched to Xarelto. Previous orthostatic dizziness improved with stopping diuretic and alpha blockers he was taking for enlarged prostate.  He was seen in the office on 08/10/2015 with shortness of breath with exertion. He had outpatient stress test done on 08/25/2015 which showed EF mildly decreased 45-54%, no significant ST elevation or depression during stress portion, overall considered low risk study, however has prior myocardial infarction with peri-infarct ischemia. The peri-infarct ischemia was felt to be new compared to the previous stress test, therefore outpatient cardiac catheterization was arranged. He underwent planned study on 09/22/2015 which showed 30% mid LAD lesion, 99% mid to distal left circumflex lesion, 50% OM 3 lesion. Due to poor support from right radial approach and concern for renal function, it was planned for staged PCI at a later time. He was admitted to [facility] on 10/06/15 for staged PCI/DES to mid LCx lesion that wa 99% stenosed. There was residual 30% mid LAD stenosis to be treated medically.During that hospitalization his Maxide was stopped and he was started on amlodipine.  On follow up today he remains on Plavix and Xarelto. He notes increased edema and is gaining weight- about 10 lbs. BS are running high. His BP has not been controlled. He denies any increased SOB or chest pain.  Current Outpatient Prescriptions on File Prior to Visit  Medication Sig Dispense Refill  . atorvastatin (LIPITOR) 40 MG tablet Take 1 tablet (40 mg  total) by mouth daily. 30 tablet 5  . B Complex Vitamins (VITAMIN B COMPLEX) TABS Take 1 tablet by mouth 3 (three) times a week.     Marland Kitchen BAYER MICROLET LANCETS lancets Use to test blood sugar once daily dx: E11.40 100 each 1  . Blood Glucose Monitoring Suppl (CONTOUR NEXT EZ MONITOR) w/Device KIT Use to test blood sugar once daily dx: E11.40 1 kit 0  . Cholecalciferol (VITAMIN D) 2000 UNITS CAPS Take 2,000 Units by mouth daily.     . clopidogrel (PLAVIX) 75 MG tablet Take 1 tablet (75 mg total) by mouth daily with breakfast. 30 tablet 5  . finasteride (PROSCAR) 5 MG tablet TAKE 1 TABLET (5 MG TOTAL) BY MOUTH DAILY. 90 tablet 3  . glimepiride (AMARYL) 4 MG tablet Take 4 mg by mouth daily with breakfast.    . glucose blood (BAYER CONTOUR NEXT TEST) test strip Use to test blood sugar once daily dx: E11.40 100 each 1  . metFORMIN (GLUCOPHAGE-XR) 500 MG 24 hr tablet TAKE 2 TABLETS BY MOUTH DAILY WITH BREAKFAST. 180 tablet 1  . metoprolol succinate (TOPROL-XL) 100 MG 24 hr tablet Take 1 tablet (100 mg total) by mouth daily. Take with or immediately following a meal. 90 tablet 3  . Multiple Vitamin (MULTIVITAMIN WITH MINERALS) TABS tablet Take 1 tablet by mouth daily.    . nitroGLYCERIN (NITROSTAT) 0.4 MG SL tablet Place 1 tablet (0.4 mg total) under the tongue every 5 (five) minutes as needed for chest pain. 25 tablet 3  . pantoprazole (PROTONIX) 40 MG tablet Take 40 mg by mouth daily.    Marland Kitchen  ramipril (ALTACE) 2.5 MG capsule TAKE 1 CAPSULE (2.5 MG TOTAL) BY MOUTH DAILY.  3  . rivaroxaban (XARELTO) 20 MG TABS tablet Take 1 tablet (20 mg total) by mouth daily with supper. 30 tablet 1  . tamsulosin (FLOMAX) 0.4 MG CAPS capsule Take 1 capsule (0.4 mg total) by mouth daily. 90 capsule 3  . traMADol (ULTRAM) 50 MG tablet TAKE ONE TABLET BY MOUTH THREE TIMES DAILY AS NEEDED FOR PAIN 30 tablet 0   No current facility-administered medications on file prior to visit.    Allergies  Allergen Reactions  . Doxazosin  Other (See Comments)    dizziness    Past Medical History  Diagnosis Date  . Hypertrophy of prostate without urinary obstruction and other lower urinary tract symptoms (LUTS)   . Esophageal reflux   . Unspecified essential hypertension   . Plantar wart   . Tachycardia, unspecified   . Orthostatic lightheadedness   . Atrial fibrillation ([place])     x1  . CAD (coronary artery disease)     nonobstructive  . Thrombocytopenia ([place])   . Shortness of breath dyspnea     with exertion  . Headache   . Complication of anesthesia     "03/2015 didn't get sick but lost my appetite for a couple months" (10/06/2015)  . Type II diabetes mellitus ([place])   . Heart murmur   . Stroke [facility]) 03/2014    "had a series of mini strokes; maybe 4"; denies residual on 10/06/2015  . Osteoarthrosis, unspecified whether generalized or localized, unspecified site   . Arthritis     "legs, back" (10/06/2015)  . Chronic kidney disease, stage III (moderate)   . Kidney stones   . Cancer ([place])     skin cancer on ear (froze it off) and back (cut it off)    Past Surgical History  Procedure Laterality Date  . Esophagogastroduodenoscopy (egd) with esophageal dilation  2001    with dilation  . Back surgery    . Shoulder arthroscopy w/ rotator cuff repair Left 2005  . Cardiac catheterization  12/2007  . Colonoscopy w/ biopsies and polypectomy    . Ear cyst excision N/A 04/06/2015    Procedure: EXCISION OF SCALP CYST;  Surgeon: Donnie Mesa, MD;  Location: [place];  Service: General;  Laterality: N/A;  . Cardiac catheterization N/A 09/22/2015    Procedure: Left Heart Cath and Coronary Angiography;  Surgeon: Clotilde Loth M Martinique, MD;  Location: [place] CV LAB;  Service: Cardiovascular;  Laterality: N/A;  . Cardiac catheterization N/A 10/06/2015    Procedure: Coronary Stent Intervention;  Surgeon: Dion Parrow M Martinique, MD;  Location: [place] CV LAB;  Service: Cardiovascular;  Laterality: N/A;  . Lumbar disc surgery  09/26/1999     "cleaned out arthritis and bone spurs"  . Coronary stent placement  10/06/2015    [name]  with DES    History  Smoking status  . Former Smoker -- 3 years  . Types: Cigarettes  Smokeless tobacco  . Never Used    Comment: " Quit smoking by age 70; was a someday smoker "    History  Alcohol Use No    Family History  Problem Relation Age of Onset  . Stroke Father   . Peripheral vascular disease Father     amputation  . Heart failure Mother     CHF  . Coronary artery disease Mother   . Heart attack Mother     Multiple  . COPD Brother   .  Heart disease Brother   . Cancer Brother     ?  . Lung cancer Sister     Review of Systems: The review of systems is as noted in HPI.  All other systems were reviewed and are negative.  Physical Exam: BP 175/85 mmHg  Pulse 77  Ht _0  (1.778 m)  Wt 100.517 kg (221 lb 9.6 oz)  BMI 31.80 kg/m2 He is a pleasant white male in no acute distress.  The HEENT exam is normal.  The carotids are 2+ without bruits.  There is no thyromegaly.  There is no JVD.  The lungs are clear.    The heart exam reveals a regular rate with a normal S1 and S2.  There are no murmurs, gallops, or rubs.  The PMI is not displaced.   Abdominal exam reveals good bowel sounds.    There is no hepatosplenomegaly or tenderness.  There are no masses.  Exam of the legs reveal 1+ bilateral edema. The distal pulses are intact.  Cranial nerves II - XII are intact.  Motor and sensory functions are intact.  The gait is normal.  LABORATORY DATA: Lab Results  Component Value Date   WBC 6.0 10/07/2015   HGB 11.1* 10/07/2015   HCT 34.5* 10/07/2015   PLT 125* 10/07/2015   GLUCOSE 257* 10/12/2015   CHOL 135 03/28/2015   TRIG 337.0* 03/28/2015   HDL 30.70* 03/28/2015   LDLDIRECT 77.0 03/28/2015   LDLCALC 47 04/01/2014   ALT 26 03/28/2015   AST 22 03/28/2015   NA 140 10/12/2015   K 4.1 10/12/2015   CL 104 10/12/2015   CREATININE 1.34 10/12/2015   BUN 16 10/12/2015   CO2 28  10/12/2015   TSH 3.33 08/26/2013   INR 1.17 10/05/2015   HGBA1C 8.2* 09/30/2015   MICROALBUR 17.2* 05/23/2012      Assessment / Plan: 1. CAD s/p DES of mid LCx in January 2017. No recurrent angina. Will continue Plavix for one year. No ASA since he is on Xarelto.   2. Hypertension, poorly controlled. He is having increased edema on low dose amlodipine. His BP did much better in past on Maxide. Will resume Maxide and stop amlodipine. Repeat BMET in 2 weeks. Sodium restriction.   3. CKD stage 3.   4. Paroxysmal Afib. Continue metoprolol and Xarelto.  5. History of CVA.  6. DM with poor control. He is going to contact Dr Silvio Pate.  I will follow up in 3 months.

## 2016-01-12 NOTE — Patient Instructions (Signed)
Stop amlodipine   Resume Maxide 37.7/25 mg daily  Follow up with Dr. Silvio Pate about your blood sugar.  We will check your renal function in 2 weeks.  I will see you in 3 months.

## 2016-01-16 ENCOUNTER — Ambulatory Visit (INDEPENDENT_AMBULATORY_CARE_PROVIDER_SITE_OTHER): Payer: Medicare Other | Admitting: Internal Medicine

## 2016-01-16 ENCOUNTER — Encounter: Payer: Self-pay | Admitting: Internal Medicine

## 2016-01-16 VITALS — BP 114/70 | HR 71 | Temp 97.4°F | Wt 217.0 lb

## 2016-01-16 DIAGNOSIS — I10 Essential (primary) hypertension: Secondary | ICD-10-CM | POA: Diagnosis not present

## 2016-01-16 DIAGNOSIS — E1165 Type 2 diabetes mellitus with hyperglycemia: Secondary | ICD-10-CM | POA: Diagnosis not present

## 2016-01-16 DIAGNOSIS — IMO0002 Reserved for concepts with insufficient information to code with codable children: Secondary | ICD-10-CM

## 2016-01-16 DIAGNOSIS — E1140 Type 2 diabetes mellitus with diabetic neuropathy, unspecified: Secondary | ICD-10-CM

## 2016-01-16 MED ORDER — METFORMIN HCL ER 500 MG PO TB24: 1000.0000 mg | ORAL_TABLET | Freq: Two times a day (BID) | ORAL | Status: DC

## 2016-01-16 NOTE — Assessment & Plan Note (Signed)
Running over 200 fasting all the time He isn't excited about insulin but discussed that it may be appropriate Will increase the metformin to 1000 bid Recheck A1c at next visit

## 2016-01-16 NOTE — Patient Instructions (Signed)
Please increase the metformin 500mg  to 2 tabs twice a day. Make sure you are also taking the glimepiride.

## 2016-01-16 NOTE — Assessment & Plan Note (Signed)
BP Readings from Last 3 Encounters:  01/16/16 114/70  01/12/16 175/85  10/14/15 160/82   Repeat 110/58 on right Striking decrease with removing amlodipine and starting the diuretic Feels fine on current regimen

## 2016-01-16 NOTE — Progress Notes (Signed)
Subjective:    Patient ID: Adrian Singh, male    DOB: [birth year], 82 y.o.   MRN: [id number]  HPI Here with wife due to BP being up and blood sugar  Sugars have been over 200 most of the time This is fasting On 2 metformin daily Pretty sure he is taking the glimepiride "Doing the best I can" about his eating. No sugared drinks--or very rare  Blood pressure up when went to Dr Martinique Put on triamterene/HCTZ and stopped the amlodipine (which was causing edema) No chest pain--but has some pain up under left ribs No SOB  Current Outpatient Prescriptions on File Prior to Visit  Medication Sig Dispense Refill  . atorvastatin (LIPITOR) 40 MG tablet Take 1 tablet (40 mg total) by mouth daily. 30 tablet 5  . B Complex Vitamins (VITAMIN B COMPLEX) TABS Take 1 tablet by mouth 3 (three) times a week.     Marland Kitchen BAYER MICROLET LANCETS lancets Use to test blood sugar once daily dx: E11.40 100 each 1  . Blood Glucose Monitoring Suppl (CONTOUR NEXT EZ MONITOR) w/Device KIT Use to test blood sugar once daily dx: E11.40 1 kit 0  . Cholecalciferol (VITAMIN D) 2000 UNITS CAPS Take 2,000 Units by mouth daily.     . clopidogrel (PLAVIX) 75 MG tablet Take 1 tablet (75 mg total) by mouth daily with breakfast. 30 tablet 5  . finasteride (PROSCAR) 5 MG tablet TAKE 1 TABLET (5 MG TOTAL) BY MOUTH DAILY. 90 tablet 3  . glimepiride (AMARYL) 4 MG tablet Take 4 mg by mouth daily with breakfast.    . glucose blood (BAYER CONTOUR NEXT TEST) test strip Use to test blood sugar once daily dx: E11.40 100 each 1  . metFORMIN (GLUCOPHAGE-XR) 500 MG 24 hr tablet TAKE 2 TABLETS BY MOUTH DAILY WITH BREAKFAST. 180 tablet 1  . metoprolol succinate (TOPROL-XL) 100 MG 24 hr tablet Take 1 tablet (100 mg total) by mouth daily. Take with or immediately following a meal. 90 tablet 3  . Multiple Vitamin (MULTIVITAMIN WITH MINERALS) TABS tablet Take 1 tablet by mouth daily.    . nitroGLYCERIN (NITROSTAT) 0.4 MG SL tablet Place 1 tablet  (0.4 mg total) under the tongue every 5 (five) minutes as needed for chest pain. 25 tablet 3  . pantoprazole (PROTONIX) 40 MG tablet Take 40 mg by mouth daily.    . ramipril (ALTACE) 2.5 MG capsule TAKE 1 CAPSULE (2.5 MG TOTAL) BY MOUTH DAILY.  3  . rivaroxaban (XARELTO) 20 MG TABS tablet Take 1 tablet (20 mg total) by mouth daily with supper. 30 tablet 1  . tamsulosin (FLOMAX) 0.4 MG CAPS capsule Take 1 capsule (0.4 mg total) by mouth daily. 90 capsule 3  . traMADol (ULTRAM) 50 MG tablet TAKE ONE TABLET BY MOUTH THREE TIMES DAILY AS NEEDED FOR PAIN 30 tablet 0  . triamterene-hydrochlorothiazide (MAXZIDE-25) 37.5-25 MG tablet Take 1 tablet by mouth daily. 30 tablet 11   No current facility-administered medications on file prior to visit.    Allergies  Allergen Reactions  . Doxazosin Other (See Comments)    dizziness    Past Medical History  Diagnosis Date  . Hypertrophy of prostate without urinary obstruction and other lower urinary tract symptoms (LUTS)   . Esophageal reflux   . Unspecified essential hypertension   . Plantar wart   . Tachycardia, unspecified   . Orthostatic lightheadedness   . Atrial fibrillation ([place])     x1  . CAD (coronary artery  disease)     nonobstructive  . Thrombocytopenia ([place])   . Shortness of breath dyspnea     with exertion  . Headache   . Complication of anesthesia     "03/2015 didn't get sick but lost my appetite for a couple months" (10/06/2015)  . Type II diabetes mellitus ([place])   . Heart murmur   . Stroke [facility]) 03/2014    "had a series of mini strokes; maybe 4"; denies residual on 10/06/2015  . Osteoarthrosis, unspecified whether generalized or localized, unspecified site   . Arthritis     "legs, back" (10/06/2015)  . Chronic kidney disease, stage III (moderate)   . Kidney stones   . Cancer ([place])     skin cancer on ear (froze it off) and back (cut it off)    Past Surgical History  Procedure Laterality Date  . Esophagogastroduodenoscopy  (egd) with esophageal dilation  2001    with dilation  . Back surgery    . Shoulder arthroscopy w/ rotator cuff repair Left 2005  . Cardiac catheterization  12/2007  . Colonoscopy w/ biopsies and polypectomy    . Ear cyst excision N/A 04/06/2015    Procedure: EXCISION OF SCALP CYST;  Surgeon: Donnie Mesa, MD;  Location: [place];  Service: General;  Laterality: N/A;  . Cardiac catheterization N/A 09/22/2015    Procedure: Left Heart Cath and Coronary Angiography;  Surgeon: Peter M Martinique, MD;  Location: [place] CV LAB;  Service: Cardiovascular;  Laterality: N/A;  . Cardiac catheterization N/A 10/06/2015    Procedure: Coronary Stent Intervention;  Surgeon: Peter M Martinique, MD;  Location: [place] CV LAB;  Service: Cardiovascular;  Laterality: N/A;  . Lumbar disc surgery  09/26/1999    "cleaned out arthritis and bone spurs"  . Coronary stent placement  10/06/2015    [name]  with DES    Family History  Problem Relation Age of Onset  . Stroke Father   . Peripheral vascular disease Father     amputation  . Heart failure Mother     CHF  . Coronary artery disease Mother   . Heart attack Mother     Multiple  . COPD Brother   . Heart disease Brother   . Cancer Brother     ?  . Lung cancer Sister     Social History   Social History  . Marital Status: Married    Spouse Name: N/A  . Number of Children: 4  . Years of Education: N/A   Occupational History  . 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 Smoking status: Former Smoker -- 3 years    Types: Cigarettes  . Smokeless tobacco: Never Used     Comment: " Quit smoking by age 44; was a someday smoker "  . Alcohol Use: No  . Drug Use: No  . Sexual Activity: No   Other Topics Concern  . Not on file   Social History Narrative   No living will   Plans wife and then children to make health care decisions for him if unable   Would request at least attempts at resuscitation but no prolonged life support    Doesn't think he would want tube feeds if cognitively unaware   Review of Systems  Sleeps well Appetite is fine Weight stable     Objective:   Physical Exam  Constitutional: He appears well-developed and well-nourished. No distress.  Cardiovascular: Normal rate, regular rhythm and  normal heart sounds.  Exam reveals no gallop.   No murmur heard. Pulmonary/Chest: Effort normal and breath sounds normal. No respiratory distress. He has no wheezes. He has no rales.  Musculoskeletal: He exhibits no edema.          Assessment & Plan:

## 2016-01-16 NOTE — Progress Notes (Signed)
Pre visit review using our clinic review tool, if applicable. No additional management support is needed unless otherwise documented below in the visit note. 

## 2016-01-25 DIAGNOSIS — E1140 Type 2 diabetes mellitus with diabetic neuropathy, unspecified: Secondary | ICD-10-CM | POA: Diagnosis not present

## 2016-02-01 ENCOUNTER — Other Ambulatory Visit: Payer: Self-pay | Admitting: Internal Medicine

## 2016-02-02 NOTE — Telephone Encounter (Signed)
Approved: 30 x 0 

## 2016-02-02 NOTE — Telephone Encounter (Signed)
Pt has a f/u scheduled on 04/02/16, last refilled on 12/28/15 #30 with 0 refills, please advise

## 2016-02-03 ENCOUNTER — Other Ambulatory Visit: Payer: Self-pay | Admitting: Internal Medicine

## 2016-02-03 NOTE — Telephone Encounter (Signed)
Left refill on voice mail at pharmacy  

## 2016-02-24 ENCOUNTER — Other Ambulatory Visit: Payer: Self-pay | Admitting: Internal Medicine

## 2016-02-28 ENCOUNTER — Emergency Department (HOSPITAL_COMMUNITY): Payer: Medicare Other

## 2016-02-28 ENCOUNTER — Observation Stay (HOSPITAL_COMMUNITY)
Admission: EM | Admit: 2016-02-28 | Discharge: 2016-02-29 | Disposition: A | Discharge status: Home / Self Care | Payer: Medicare Other | Attending: Cardiology | Admitting: Cardiology

## 2016-02-28 ENCOUNTER — Encounter (HOSPITAL_COMMUNITY): Payer: Self-pay

## 2016-02-28 DIAGNOSIS — I129 Hypertensive chronic kidney disease with stage 1 through stage 4 chronic kidney disease, or unspecified chronic kidney disease: Secondary | ICD-10-CM | POA: Diagnosis not present

## 2016-02-28 DIAGNOSIS — Z8673 Personal history of transient ischemic attack (TIA), and cerebral infarction without residual deficits: Secondary | ICD-10-CM | POA: Insufficient documentation

## 2016-02-28 DIAGNOSIS — E1122 Type 2 diabetes mellitus with diabetic chronic kidney disease: Secondary | ICD-10-CM | POA: Insufficient documentation

## 2016-02-28 DIAGNOSIS — Z87891 Personal history of nicotine dependence: Secondary | ICD-10-CM | POA: Insufficient documentation

## 2016-02-28 DIAGNOSIS — I480 Paroxysmal atrial fibrillation: Secondary | ICD-10-CM | POA: Diagnosis not present

## 2016-02-28 DIAGNOSIS — R079 Chest pain, unspecified: Secondary | ICD-10-CM

## 2016-02-28 DIAGNOSIS — I200 Unstable angina: Secondary | ICD-10-CM | POA: Diagnosis present

## 2016-02-28 DIAGNOSIS — R0602 Shortness of breath: Secondary | ICD-10-CM | POA: Insufficient documentation

## 2016-02-28 DIAGNOSIS — M1990 Unspecified osteoarthritis, unspecified site: Secondary | ICD-10-CM | POA: Diagnosis not present

## 2016-02-28 DIAGNOSIS — Z7984 Long term (current) use of oral hypoglycemic drugs: Secondary | ICD-10-CM | POA: Diagnosis not present

## 2016-02-28 DIAGNOSIS — I25110 Atherosclerotic heart disease of native coronary artery with unstable angina pectoris: Secondary | ICD-10-CM | POA: Diagnosis not present

## 2016-02-28 DIAGNOSIS — Z85828 Personal history of other malignant neoplasm of skin: Secondary | ICD-10-CM | POA: Insufficient documentation

## 2016-02-28 DIAGNOSIS — Z79899 Other long term (current) drug therapy: Secondary | ICD-10-CM | POA: Insufficient documentation

## 2016-02-28 DIAGNOSIS — Z7902 Long term (current) use of antithrombotics/antiplatelets: Secondary | ICD-10-CM | POA: Diagnosis not present

## 2016-02-28 DIAGNOSIS — I2510 Atherosclerotic heart disease of native coronary artery without angina pectoris: Secondary | ICD-10-CM

## 2016-02-28 DIAGNOSIS — I10 Essential (primary) hypertension: Secondary | ICD-10-CM | POA: Diagnosis present

## 2016-02-28 DIAGNOSIS — N183 Chronic kidney disease, stage 3 unspecified: Secondary | ICD-10-CM | POA: Diagnosis present

## 2016-02-28 DIAGNOSIS — Z7901 Long term (current) use of anticoagulants: Secondary | ICD-10-CM | POA: Insufficient documentation

## 2016-02-28 DIAGNOSIS — E875 Hyperkalemia: Secondary | ICD-10-CM | POA: Insufficient documentation

## 2016-02-28 DIAGNOSIS — Z9861 Coronary angioplasty status: Secondary | ICD-10-CM

## 2016-02-28 LAB — I-STAT CHEM 8, ED
BUN: 32 mg/dL — ABNORMAL HIGH (ref 6–20)
Calcium, Ion: 1.11 mmol/L — ABNORMAL LOW (ref 1.13–1.30)
Chloride: 107 mmol/L (ref 101–111)
Creatinine, Ser: 1.4 mg/dL — ABNORMAL HIGH (ref 0.61–1.24)
Glucose, Bld: 157 mg/dL — ABNORMAL HIGH (ref 65–99)
HCT: 34 % — ABNORMAL LOW (ref 39.0–52.0)
Hemoglobin: 11.6 g/dL — ABNORMAL LOW (ref 13.0–17.0)
Potassium: 5.6 mmol/L — ABNORMAL HIGH (ref 3.5–5.1)
Sodium: 139 mmol/L (ref 135–145)
TCO2: 21 mmol/L (ref 0–100)

## 2016-02-28 LAB — CBC
HCT: 37.2 % — ABNORMAL LOW (ref 39.0–52.0)
Hemoglobin: 11.7 g/dL — ABNORMAL LOW (ref 13.0–17.0)
MCH: 26.7 pg (ref 26.0–34.0)
MCHC: 31.5 g/dL (ref 30.0–36.0)
MCV: 84.9 fL (ref 78.0–100.0)
Platelets: 122 10*3/uL — ABNORMAL LOW (ref 150–400)
RBC: 4.38 MIL/uL (ref 4.22–5.81)
RDW: 15.2 % (ref 11.5–15.5)
WBC: 6.4 10*3/uL (ref 4.0–10.5)

## 2016-02-28 LAB — BASIC METABOLIC PANEL
Anion gap: 8 (ref 5–15)
BUN: 33 mg/dL — ABNORMAL HIGH (ref 6–20)
CO2: 21 mmol/L — ABNORMAL LOW (ref 22–32)
Calcium: 9.1 mg/dL (ref 8.9–10.3)
Chloride: 107 mmol/L (ref 101–111)
Creatinine, Ser: 1.61 mg/dL — ABNORMAL HIGH (ref 0.61–1.24)
GFR calc Af Amer: 45 mL/min — ABNORMAL LOW (ref >60)
GFR calc non Af Amer: 39 mL/min — ABNORMAL LOW (ref >60)
Glucose, Bld: 252 mg/dL — ABNORMAL HIGH (ref 65–99)
Potassium: 5.8 mmol/L — ABNORMAL HIGH (ref 3.5–5.1)
Sodium: 136 mmol/L (ref 135–145)

## 2016-02-28 LAB — I-STAT TROPONIN, ED: Troponin i, poc: 0 ng/mL (ref 0.00–0.08)

## 2016-02-28 LAB — TROPONIN I
Troponin I: <0.03 ng/mL (ref <0.031)
Troponin I: <0.03 ng/mL (ref <0.031)

## 2016-02-28 MED ORDER — NITROGLYCERIN 0.4 MG SL SUBL: 0.4000 mg | SUBLINGUAL_TABLET | SUBLINGUAL | Status: DC | PRN

## 2016-02-28 MED ORDER — NITROGLYCERIN 0.4 MG SL SUBL
0.4000 mg | SUBLINGUAL_TABLET | SUBLINGUAL | Status: DC | PRN
  Administered 2016-02-28: 0.4 mg via SUBLINGUAL

## 2016-02-28 MED ORDER — CLOPIDOGREL BISULFATE 75 MG PO TABS
75.0000 mg | ORAL_TABLET | Freq: Every day | ORAL | Status: DC
  Administered 2016-02-29: 75 mg via ORAL
  Filled 2016-02-28: qty 1

## 2016-02-28 MED ORDER — ACETAMINOPHEN 325 MG PO TABS
650.0000 mg | ORAL_TABLET | ORAL | Status: DC | PRN
  Administered 2016-02-28 – 2016-02-29 (×2): 650 mg via ORAL
  Filled 2016-02-28 (×2): qty 2

## 2016-02-28 MED ORDER — SODIUM CHLORIDE 0.9 % IV BOLUS (SEPSIS): 1000.0000 mL | Freq: Once | INTRAVENOUS | Status: DC

## 2016-02-28 MED ORDER — SODIUM CHLORIDE 0.9 % IV SOLN: 250.0000 mL | INTRAVENOUS | Status: DC | PRN

## 2016-02-28 MED ORDER — TAMSULOSIN HCL 0.4 MG PO CAPS
0.4000 mg | ORAL_CAPSULE | Freq: Every day | ORAL | Status: DC
  Administered 2016-02-28 – 2016-02-29 (×2): 0.4 mg via ORAL
  Filled 2016-02-28 (×2): qty 1

## 2016-02-28 MED ORDER — SODIUM CHLORIDE 0.9% FLUSH: 3.0000 mL | Freq: Two times a day (BID) | INTRAVENOUS | Status: DC

## 2016-02-28 MED ORDER — GLIMEPIRIDE 4 MG PO TABS: 4.0000 mg | ORAL_TABLET | Freq: Every day | ORAL | Status: DC

## 2016-02-28 MED ORDER — METOPROLOL SUCCINATE ER 100 MG PO TB24
100.0000 mg | ORAL_TABLET | Freq: Every day | ORAL | Status: DC
  Administered 2016-02-28 – 2016-02-29 (×2): 100 mg via ORAL
  Filled 2016-02-28: qty 4
  Filled 2016-02-28: qty 1

## 2016-02-28 MED ORDER — NITROGLYCERIN 0.4 MG SL SUBL
SUBLINGUAL_TABLET | SUBLINGUAL | Status: AC
  Filled 2016-02-28: qty 1

## 2016-02-28 MED ORDER — ATORVASTATIN CALCIUM 40 MG PO TABS
40.0000 mg | ORAL_TABLET | Freq: Every day | ORAL | Status: DC
  Administered 2016-02-28 – 2016-02-29 (×2): 40 mg via ORAL
  Filled 2016-02-28 (×2): qty 1

## 2016-02-28 MED ORDER — FINASTERIDE 5 MG PO TABS
5.0000 mg | ORAL_TABLET | Freq: Every day | ORAL | Status: DC
  Administered 2016-02-28 – 2016-02-29 (×2): 5 mg via ORAL
  Filled 2016-02-28 (×2): qty 1

## 2016-02-28 MED ORDER — PANTOPRAZOLE SODIUM 40 MG PO TBEC
40.0000 mg | DELAYED_RELEASE_TABLET | Freq: Every day | ORAL | Status: DC
  Administered 2016-02-28 – 2016-02-29 (×2): 40 mg via ORAL
  Filled 2016-02-28 (×2): qty 1

## 2016-02-28 MED ORDER — SODIUM CHLORIDE 0.9 % IV BOLUS (SEPSIS)
1000.0000 mL | Freq: Once | INTRAVENOUS | Status: AC
  Administered 2016-02-28: 1000 mL via INTRAVENOUS

## 2016-02-28 MED ORDER — SODIUM CHLORIDE 0.9% FLUSH: 3.0000 mL | INTRAVENOUS | Status: DC | PRN

## 2016-02-28 MED ORDER — SODIUM CHLORIDE 0.9 % WEIGHT BASED INFUSION
1.0000 mL/kg/h | INTRAVENOUS | Status: DC
  Administered 2016-02-28: 1 mL/kg/h via INTRAVENOUS

## 2016-02-28 MED ORDER — ONDANSETRON HCL 4 MG/2ML IJ SOLN: 4.0000 mg | Freq: Four times a day (QID) | INTRAMUSCULAR | Status: DC | PRN

## 2016-02-28 MED ORDER — ASPIRIN 81 MG PO CHEW
81.0000 mg | CHEWABLE_TABLET | ORAL | Status: AC
  Administered 2016-02-29: 81 mg via ORAL
  Filled 2016-02-28: qty 1

## 2016-02-28 NOTE — H&P (Signed)
Patient ID: Adrian Singh MRN: [id number], DOB/AGE: 1934/03/12   Admit date: 02/28/2016   Primary Physician: Viviana Simpler, MD Primary Cardiologist: Dr. Martinique  Pt. Profile:  82 y/o male with h/o CAD s/p recent PCI + DES to LCx 09/2015, residual 30% LAD stenosis treated medically and PAF on Xarelto + Plavix, presenting to the ED with recurrent CP.   Problem List  Past Medical History  Diagnosis Date  . Hypertrophy of prostate without urinary obstruction and other lower urinary tract symptoms (LUTS)   . Esophageal reflux   . Unspecified essential hypertension   . Atrial fibrillation ([place])     x1  . CAD (coronary artery disease)     nonobstructive  . Thrombocytopenia ([place])   . Complication of anesthesia     "03/2015 didn't get sick but lost my appetite for a couple months" (10/06/2015)  . Type II diabetes mellitus ([place])   . Stroke [facility]) 03/2014    "had a series of mini strokes; maybe 4"; denies residual on 10/06/2015  . Osteoarthrosis, unspecified whether generalized or localized, unspecified site   . Arthritis     "legs, back" (10/06/2015)  . Chronic kidney disease, stage III (moderate)   . Kidney stones   . 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 Esophagogastroduodenoscopy (egd) with esophageal dilation  2001    with dilation  . Back surgery    . Shoulder arthroscopy w/ rotator cuff repair Left 2005  . Cardiac catheterization  12/2007  . Colonoscopy w/ biopsies and polypectomy    . Ear cyst excision N/A 04/06/2015    Procedure: EXCISION OF SCALP CYST;  Surgeon: Donnie Mesa, MD;  Location: [place];  Service: General;  Laterality: N/A;  . Cardiac catheterization N/A 09/22/2015    Procedure: Left Heart Cath and Coronary Angiography;  Surgeon: Peter M Martinique, MD;  Location: [place] CV [facility];  Service: Cardiovascular;  Laterality: N/A;  . Cardiac catheterization N/A 10/06/2015     Procedure: Coronary Stent Intervention;  Surgeon: Peter M Martinique, MD;  Location: [place] [facility];  Service: Cardiovascular;  Laterality: N/A;  . Lumbar disc surgery  09/26/1999    "cleaned out arthritis and bone spurs"  . Coronary stent placement  10/06/2015    [name]  with DES     Allergies  Allergies  Allergen Reactions  . Doxazosin Other (See Comments)    dizziness    HPI  Adrian Singh is a 82 y/o male, followed by Dr. Martinique, with a h/o CAD and PAF. He was admitted in July 0350 with an embolic stroke presumably due to paroxysmal Afib. He was anticoagulated with Eliquis. The patient developed tachycardia and HA on Eliquis that resolved when he was switched to Xarelto. Previous orthostatic dizziness improved with stopping diuretic and alpha blockers he was taking for enlarged prostate.   He was seen in the office on 08/10/2015 with shortness of breath with exertion. He had outpatient stress test done on 08/25/2015 which showed EF mildly decreased 45-54%, no significant ST elevation or depression during stress portion, overall considered low risk study, however has prior myocardial infarction with peri-infarct ischemia. The peri-infarct ischemia was felt to be new compared to the previous stress test, therefore outpatient cardiac catheterization was arranged. He underwent planned study on 09/22/2015 which showed 30% mid LAD lesion, 99% mid to distal left circumflex lesion, 50%  OM 3 lesion. Due to poor support from right radial approach and concern for renal function, it was planned for staged PCI at a later time. He was admitted to [facility] on 10/06/15 for staged PCI/DES to mid LCx lesion that was 99% stenosed. There was residual 30% mid LAD stenosis to be treated medically.During that hospitalization his Maxide was stopped and he was started on amlodipine.   He now presents back to the [facility] ED with complaint of recurrent CP. He notes left sided chest pain radiating to his left arm. Occurs at rest and worse  with exertion. It has been occurring off and on for the past several weeks but has worsened over the last week. Episodes are occuring more frequently. Also with mild exertional dyspnea, dizziness and near syncope. However also somewhat atypical in that he has some reproducible symptoms with palpation of chest wall. He reports full medication compliance.   In ED, POC troponin is negative. EKG shows NSR with low voltage QRS complexes in several leads + appearance of electrical alternans. CXR is unremarkable with normal cardiac silhouette.    CBC shows anemia with Hgb at 11.6. He denies melena. BMP shows hyperkalemia with K of 5.8. Repeat BMP shows K at 5.6. He takes Altace 2.5 mg daily. BMP also shows renal insufficiency. Initial Scr 1.61>>1.40, which appears to be his baseline (1.3-1.7).    Home Medications  Prior to Admission medications   Medication Sig Start Date End Date Taking? Authorizing Provider  atorvastatin (LIPITOR) 40 MG tablet Take 1 tablet (40 mg total) by mouth daily. 10/07/15  Yes Brittainy Erie Noe, PA-C  B Complex Vitamins (VITAMIN B COMPLEX) TABS Take 1 tablet by mouth 3 (three) times a week.    Yes Historical Provider, MD  BAYER MICROLET LANCETS lancets Use to test blood sugar once daily dx: E11.40 10/18/15  Yes [name], MD  Blood Glucose Monitoring Suppl (CONTOUR NEXT EZ MONITOR) w/Device KIT Use to test blood sugar once daily dx: E11.40 10/12/15  Yes [name], MD  Cholecalciferol (VITAMIN D) 2000 UNITS CAPS Take 2,000 Units by mouth daily.    Yes Historical Provider, MD  clopidogrel (PLAVIX) 75 MG tablet Take 1 tablet (75 mg total) by mouth daily with breakfast. 10/07/15  Yes Brittainy Erie Noe, PA-C  finasteride (PROSCAR) 5 MG tablet TAKE 1 TABLET (5 MG TOTAL) BY MOUTH DAILY. 04/28/15  Yes [name], MD  glimepiride (AMARYL) 4 MG tablet TAKE ONE TABLET BY MOUTH WITH BREAKFAST OR FIRST MAIN MEAL OF THE DAY 02/24/16  Yes [name], MD  glucose blood  (BAYER CONTOUR NEXT TEST) test strip Use to test blood sugar once daily dx: E11.40 10/18/15  Yes [name], MD  metFORMIN (GLUCOPHAGE-XR) 500 MG 24 hr tablet Take 2 tablets (1,000 mg total) by mouth 2 (two) times daily. Patient taking differently: Take 500 mg by mouth 2 (two) times daily.  01/16/16  Yes [name], MD  metoprolol succinate (TOPROL-XL) 100 MG 24 hr tablet Take 1 tablet (100 mg total) by mouth daily. Take with or immediately following a meal. 10/14/15  Yes Eileen Stanford, PA-C  Multiple Vitamin (MULTIVITAMIN WITH MINERALS) TABS tablet Take 1 tablet by mouth daily.   Yes Historical Provider, MD  naproxen sodium (ANAPROX) 220 MG tablet Take 220 mg by mouth 2 (two) times daily as needed (pain).   Yes Historical Provider, MD  nitroGLYCERIN (NITROSTAT) 0.4 MG SL tablet Place 1 tablet (0.4 mg total) under the  tongue every 5 (five) minutes as needed for chest pain. 09/28/15  Yes Almyra Deforest, PA  pantoprazole (PROTONIX) 40 MG tablet Take 40 mg by mouth daily.   Yes Historical Provider, MD  ramipril (ALTACE) 2.5 MG capsule TAKE 1 CAPSULE (2.5 MG TOTAL) BY MOUTH DAILY. 09/01/15  Yes Historical Provider, MD  rivaroxaban (XARELTO) 20 MG TABS tablet Take 1 tablet (20 mg total) by mouth daily with supper. 12/28/15  Yes Peter M Martinique, MD  tamsulosin (FLOMAX) 0.4 MG CAPS capsule Take 1 capsule (0.4 mg total) by mouth daily. 10/19/15  Yes [name], MD  traMADol (ULTRAM) 50 MG tablet TAKE ONE TABLET BY MOUTH THREE TIMES DAILY AS NEEDED 02/03/16  Yes [name], MD  triamterene-hydrochlorothiazide (MAXZIDE-25) 37.5-25 MG tablet Take 1 tablet by mouth daily. 01/12/16  Yes Peter M Martinique, MD    Family History  Family History  Problem Relation Age of Onset  . Stroke Father   . Peripheral vascular disease Father     amputation  . Heart failure Mother     CHF  . Coronary artery disease Mother   . Heart attack Mother     Multiple  . COPD Brother   . Heart disease Brother   .  Cancer Brother     ?  . Lung cancer Sister     Social History  Social History   Social History  . Marital Status: Married    Spouse Name: N/A  . Number of Children: 4  . Years of Education: N/A   Occupational History  . 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 Smoking status: Former Smoker -- 3 years    Types: Cigarettes  . Smokeless tobacco: Never Used     Comment: " Quit smoking by age 65; was a someday smoker "  . Alcohol Use: No  . Drug Use: No  . Sexual Activity: No   Other Topics Concern  . Not on file   Social History Narrative   No living will   Plans wife and then children to make health care decisions for him if unable   Would request at least attempts at resuscitation but no prolonged life support   Doesn't think he would want tube feeds if cognitively unaware     Review of Systems General:  No chills, fever, night sweats or weight changes.  Cardiovascular:  No chest pain, dyspnea on exertion, edema, orthopnea, palpitations, paroxysmal nocturnal dyspnea. Dermatological: No rash, lesions/masses Respiratory: No cough, dyspnea Urologic: No hematuria, dysuria Abdominal:   No nausea, vomiting, diarrhea, bright red blood per rectum, melena, or hematemesis Neurologic:  No visual changes, wkns, changes in mental status. All other systems reviewed and are otherwise negative except as noted above.  Physical Exam  Blood pressure 145/95, pulse 65, temperature 97.4 F (36.3 C), temperature source Oral, resp. rate 29, height '5\' 10"'  (1.778 m), weight 107 lb (48.535 kg), SpO2 99 %.  General: Pleasant, NAD Psych: Normal affect. Neuro: Alert and oriented X 3. Moves all extremities spontaneously. HEENT: Normal  Neck: Supple without bruits or JVD. Lungs:  Resp regular and unlabored, CTA. Heart: RRR no s3, s4, or murmurs. Abdomen: Soft, non-tender, non-distended, BS + x 4.  Extremities: No clubbing, cyanosis or edema. DP/PT/Radials 2+ and equal  bilaterally.  Labs  Troponin (Point of Care Test)  Recent Labs  02/28/16 1053  TROPIPOC 0.00   No results for input(s): CKTOTAL, CKMB, TROPONINI in the last 72  hours. Lab Results  Component Value Date   WBC 6.4 02/28/2016   HGB 11.6* 02/28/2016   HCT 34.0* 02/28/2016   MCV 84.9 02/28/2016   PLT 122* 02/28/2016     Recent Labs Lab 02/28/16 1048 02/28/16 1341  NA 136 139  K 5.8* 5.6*  CL 107 107  CO2 21*  --   BUN 33* 32*  CREATININE 1.61* 1.40*  CALCIUM 9.1  --   GLUCOSE 252* 157*   Lab Results  Component Value Date   CHOL 135 03/28/2015   HDL 30.70* 03/28/2015   LDLCALC 47 04/01/2014   TRIG 337.0* 03/28/2015   No results found for: DDIMER   Radiology/Studies  Dg Chest 2 View  02/28/2016  CLINICAL DATA:  Chest pain EXAM: CHEST  2 VIEW COMPARISON:  03/31/2014 FINDINGS: The heart size and mediastinal contours are within normal limits. Both lungs are clear. The visualized skeletal structures are unremarkable. IMPRESSION: No active cardiopulmonary disease. Electronically Signed   By: Franchot Gallo M.D.   On: 02/28/2016 12:06    ECG  NSR with low voltage complexes and electrical alternans in several leads.   ASSESSMENT AND PLAN  Principal Problem:   Chest pain with moderate risk for cardiac etiology Active Problems:   Essential hypertension   Atherosclerotic heart disease of native coronary artery with angina pectoris (HCC)   Chronic kidney disease, stage III (moderate)   PAF (paroxysmal atrial fibrillation) (HCC)   Chronic anticoagulation   CAD (coronary artery disease)   1. Chest Pain with Moderate Risk for Cardiac Etiology/CAD: Patient with known h/o CAD s/p recent PCI + DES to LCx 09/2015 with residual 30% stenosis of the LAD and 50% OM1 at that time, treated medically. He notes some typical features of left sided chest pain radiating to his left arm but also some atypical features with some reproducible pain with palpation of chest wall. EKG is w/o  ischemia. POC troponin is negative. We will admit for observation and to r/o ACS. Will cycle cardiac enzymes x 3. NPO at midnight. Plan either NST vs re-look LHC in the am, depending on enzyme trend. EKG does show some low voltage QRS complexes and appearance of electrical alternans in some leads. Cardiac silhouette on CXR appears WNL, thus doubt pericardial effusion. However he does note near syncope and with EKG appearance, may need to consider 2D echo to assess the pericardium.   2. PAF: NSR on telemetry. Rate is controlled with metoprolol. Continue Xarelto for a/c. CHA2DS2 VASc score is 7 (HTN, DM, h/o TIA (2), Age>75 (2), Vas disease).   3. HTN: mildly elevated but stable. Continue home meds. Hold Altace given K level of 5.6.  4. Hyperkalemia: Initial K was 5.8. Repeat BMP showed K of 5.6. Will hold Altace for now.     5. Chronic Renal Insufficiency: Scr is 1.4, which appears to be his baseline. Monitor.    Ronnell Guadalajara, PA-C 02/28/2016, 3:36 PM   History and all data above reviewed.  Patient examined.  I agree with the findings as above.  The patient presents with chest and arm pain similar to previous angina.  This has been ongoing for the past couple of weeks and seems to be increasing in frequency and intensity.  The patient exam reveals COR:RRR  ,  Lungs: Clear  ,  Abd: Positive bowel sounds, no rebound no guarding, Ext No edema   .  All available labs, radiology testing, previous records reviewed. Agree with documented assessment and plan.  Chest pain consistent with unstable angina.  Needs to have a cardiac cath.  (Note unable to use Right radial in the past.  Was able to have last cath via left radial.)    Minus Breeding  4:13 PM  02/28/2016

## 2016-02-28 NOTE — ED Notes (Signed)
Patient complains of 1-2 weeks of left sided chest pain with radiation to left arm. States that he feels weak and dizzy with the pain. Had stent placed back in january

## 2016-02-28 NOTE — Care Management Note (Signed)
Case Management Note  Patient Details  Name: Adrian Singh MRN: [id number] Date of Birth: 11/02/1933  Subjective/Objective:                  82 y/o male with h/o CAD s/p recent PCI + DES to LCx 09/2015, residual 30% LAD stenosis treated medically and PAF on Xarelto + Plavix, presenting to the ED with recurrent CP. /From home with spouse.  Action/Plan: Follow for disposition needs.   Expected Discharge Date:  03/01/16               Expected Discharge Plan:  [place]  In-House Referral:     Discharge planning Services  CM Consult  Post Acute Care Choice:    Choice offered to:     DME Arranged:    DME Agency:     HH Arranged:    HH Agency:     Status of Service:  In process, will continue to follow  Medicare Important Message Given:    Date Medicare IM Given:    Medicare IM give by:    Date Additional Medicare IM Given:    Additional Medicare Important Message give by:     If discussed at [place] of Stay Meetings, dates discussed:    Additional Comments:  Fuller Mandril, RN 02/28/2016, 2:41 PM

## 2016-02-28 NOTE — ED Provider Notes (Signed)
CSN: [id number]     Arrival date & time 02/28/16  1035 History   First MD Initiated Contact with Patient 02/28/16 1102     Chief Complaint  Patient presents with  . Chest Pain     (Consider location/radiation/quality/duration/timing/severity/associated sxs/prior Treatment) HPI Comments: Patient with a history of CAD s/p DES to the left circumflex in January 2017 and Atrial Fibrillation currently on Xarelto presents today with left sided chest pain.  Pain has been present for the past 1-2 weeks.  He states that the pain is fairly constant, but at times does become worse.  Pain is worse with exertion, but he also states that he does have the pain at rest also.  He reports mild associated SOB with exertion.  He also reports some orthostatic dizziness.  He reports that he feels like he is going to pass out,  but no syncope.  He denies cough or hemoptysis.  Denies fever or chills.  No LE edema.  He has not taken anything for the pain prior to arrival.  He is currently on Plavix.  Last dose was last evening.  He is also on Xarelto for his history of Atrial Fibrillation.  Patient had a Cardiac Cath in January of 2017.  Cath showed 99% blockage of the left circumflex, which was stented at that time.  Cath also showed 30 % LAD lesion, which was treated medically with Plavix.  Patient is a 82 y.o. male presenting with chest pain.  Chest Pain   Past Medical History  Diagnosis Date  . Hypertrophy of prostate without urinary obstruction and other lower urinary tract symptoms (LUTS)   . Esophageal reflux   . Unspecified essential hypertension   . Plantar wart   . Tachycardia, unspecified   . Orthostatic lightheadedness   . Atrial fibrillation (HCC)     x1  . CAD (coronary artery disease)     nonobstructive  . Thrombocytopenia (HCC)   . Shortness of breath dyspnea     with exertion  . Headache   . Complication of anesthesia     "03/2015 didn't get sick but lost my appetite for a couple months"  (10/06/2015)  . Type II diabetes mellitus (HCC)   . Heart murmur   . Stroke [facility]) 03/2014    "had a series of mini strokes; maybe 4"; denies residual on 10/06/2015  . Osteoarthrosis, unspecified whether generalized or localized, unspecified site   . Arthritis     "legs, back" (10/06/2015)  . Chronic kidney disease, stage III (moderate)   . Kidney stones   . 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 Esophagogastroduodenoscopy (egd) with esophageal dilation  2001    with dilation  . Back surgery    . Shoulder arthroscopy w/ rotator cuff repair Left 2005  . Cardiac catheterization  12/2007  . Colonoscopy w/ biopsies and polypectomy    . Ear cyst excision N/A 04/06/2015    Procedure: EXCISION OF SCALP CYST;  Surgeon: Manus Rudd, MD;  Location: [facility] OR;  Service: General;  Laterality: N/A;  . Cardiac catheterization N/A 09/22/2015    Procedure: Left Heart Cath and Coronary Angiography;  Surgeon: Peter M Swaz[place], MD;  Location: [facility] INVASIVE CV LAB;  Service: Cardiovascular;  Laterality: N/A;  . Cardiac catheterization N/A 10/06/2015    Procedure: Coronary Stent Intervention;  Surgeon: Peter M Swaz[place], MD;  Location: [facility] INVASIVE  CV LAB;  Service: Cardiovascular;  Laterality: N/A;  . Lumbar disc surgery  09/26/1999    "cleaned out arthritis and bone spurs"  . Coronary stent placement  10/06/2015    [name]  with DES   Family History  Problem Relation Age of Onset  . Stroke Father   . Peripheral vascular disease Father     amputation  . Heart failure Mother     CHF  . Coronary artery disease Mother   . Heart attack Mother     Multiple  . COPD Brother   . Heart disease Brother   . Cancer Brother     ?  . Lung cancer Sister    Social History  Substance Use Topics  . Smoking status: Former Smoker -- 3 years    Types: Cigarettes  . Smokeless tobacco: Never Used     Comment: " Quit smoking by age 51; was a  someday smoker "  . Alcohol Use: No    Review of Systems  Cardiovascular: Positive for chest pain.  All other systems reviewed and are negative.     Allergies  Doxazosin  Home Medications   Prior to Admission medications   Medication Sig Start Date End Date Taking? Authorizing Provider  atorvastatin (LIPITOR) 40 MG tablet Take 1 tablet (40 mg total) by mouth daily. 10/07/15   Brittainy Erie Noe, PA-C  B Complex Vitamins (VITAMIN B COMPLEX) TABS Take 1 tablet by mouth 3 (three) times a week.     Historical Provider, MD  BAYER MICROLET LANCETS lancets Use to test blood sugar once daily dx: E11.40 10/18/15   [name], MD  Blood Glucose Monitoring Suppl (CONTOUR NEXT EZ MONITOR) w/Device KIT Use to test blood sugar once daily dx: E11.40 10/12/15   [name], MD  Cholecalciferol (VITAMIN D) 2000 UNITS CAPS Take 2,000 Units by mouth daily.     Historical Provider, MD  clopidogrel (PLAVIX) 75 MG tablet Take 1 tablet (75 mg total) by mouth daily with breakfast. 10/07/15   Brittainy Erie Noe, PA-C  finasteride (PROSCAR) 5 MG tablet TAKE 1 TABLET (5 MG TOTAL) BY MOUTH DAILY. 04/28/15   [name], MD  glimepiride (AMARYL) 4 MG tablet TAKE ONE TABLET BY MOUTH WITH BREAKFAST OR FIRST MAIN MEAL OF THE DAY 02/24/16   [name], MD  glucose blood (BAYER CONTOUR NEXT TEST) test strip Use to test blood sugar once daily dx: E11.40 10/18/15   [name], MD  metFORMIN (GLUCOPHAGE-XR) 500 MG 24 hr tablet Take 2 tablets (1,000 mg total) by mouth 2 (two) times daily. 01/16/16   [name], MD  metoprolol succinate (TOPROL-XL) 100 MG 24 hr tablet Take 1 tablet (100 mg total) by mouth daily. Take with or immediately following a meal. 10/14/15   Eileen Stanford, PA-C  Multiple Vitamin (MULTIVITAMIN WITH MINERALS) TABS tablet Take 1 tablet by mouth daily.    Historical Provider, MD  nitroGLYCERIN (NITROSTAT) 0.4 MG SL tablet Place 1 tablet (0.4 mg total) under the tongue every 5  (five) minutes as needed for chest pain. 09/28/15   Almyra Deforest, PA  pantoprazole (PROTONIX) 40 MG tablet Take 40 mg by mouth daily.    Historical Provider, MD  ramipril (ALTACE) 2.5 MG capsule TAKE 1 CAPSULE (2.5 MG TOTAL) BY MOUTH DAILY. 09/01/15   Historical Provider, MD  rivaroxaban (XARELTO) 20 MG TABS tablet Take 1 tablet (20 mg total) by mouth daily with supper. 12/28/15   Ander Slade  [place], MD  tamsulosin (FLOMAX) 0.4 MG CAPS capsule Take 1 capsule (0.4 mg total) by mouth daily. 10/19/15   [name], MD  traMADol (ULTRAM) 50 MG tablet TAKE ONE TABLET BY MOUTH THREE TIMES DAILY AS NEEDED 02/03/16   [name], MD  triamterene-hydrochlorothiazide (MAXZIDE-25) 37.5-25 MG tablet Take 1 tablet by mouth daily. 01/12/16   Peter M [place], MD   BP 110/53 mmHg  Pulse 72  Temp(Src) 97.4 F (36.3 C) (Oral)  Resp 18  Ht 5' 10" (1.778 m)  Wt 48.535 kg  BMI 15.35 kg/m2  SpO2 96% Physical Exam  Constitutional: He appears well-developed and well-nourished. No distress.  HENT:  Head: Normocephalic and atraumatic.  Mouth/Throat: Oropharynx is clear and moist.  Neck: Normal range of motion. Neck supple.  Cardiovascular: Normal rate, regular rhythm and normal heart sounds.   Pulmonary/Chest: Effort normal and breath sounds normal. No respiratory distress. He has no wheezes. He has no rales. He exhibits tenderness.  Abdominal: Soft. He exhibits no mass. There is no tenderness. There is no rebound and no guarding.  Musculoskeletal: Normal range of motion.  Neurological: He is alert.  Skin: Skin is warm and dry. He is not diaphoretic.  Psychiatric: He has a normal mood and affect.  Nursing note and vitals reviewed.   ED Course  Procedures (including critical care time) Labs Review Labs Reviewed  CBC - Abnormal; Notable for the following:    Hemoglobin 11.7 (*)    HCT 37.2 (*)    Platelets 122 (*)    All other components within normal limits  BASIC METABOLIC PANEL  I-STAT TROPOININ, ED     Imaging Review Dg Chest 2 View  02/28/2016  CLINICAL DATA:  Chest pain EXAM: CHEST  2 VIEW COMPARISON:  03/31/2014 FINDINGS: The heart size and mediastinal contours are within normal limits. Both lungs are clear. The visualized skeletal structures are unremarkable. IMPRESSION: No active cardiopulmonary disease. Electronically Signed   By: Franchot Gallo M.D.   On: 02/28/2016 12:06   I have personally reviewed and evaluated these images and lab results as part of my medical decision-making.   EKG Interpretation   Date/Time:  Tuesday February 28 2016 10:43:34 EDT Ventricular Rate:  74 PR Interval:  176 QRS Duration: 78 QT Interval:  376 QTC Calculation: 417 R Axis:   -36 Text Interpretation:  Normal sinus rhythm Left axis deviation Lateral  infarct , age undetermined Inferior infarct , age undetermined Abnormal  ECG no acute ischemic changes  Confirmed by LIU MD, DANA [phone]) on  02/28/2016 11:39:22 AM      12:21 PM Discussed with Cardiology who reports that they will come see the patient in the ED. MDM   Final diagnoses:  None   Patient with a history of CAD s/p DES to the left circumflex in January 2017 presents today with left sided chest pain radiating down the left arm.  Pain has been occurring both at rest and with exertion.  No ischemic changes on EKG.  Initial troponin negative.  CXR is also negative.  Pain improved slightly with one SL NG.  However, patient became hypotensive after one pill.  Therefore, further NG was not given.  Blood pressure did then stabilize.  Cardiology consulted and agreed to admit the patient.      Hyman Bible, PA-C 02/28/16 Hebron Liu, MD 02/28/16 [id number]  Forde Dandy, MD 02/28/16 [phone]

## 2016-02-29 ENCOUNTER — Observation Stay (HOSPITAL_BASED_OUTPATIENT_CLINIC_OR_DEPARTMENT_OTHER): Payer: Medicare Other

## 2016-02-29 ENCOUNTER — Encounter (HOSPITAL_COMMUNITY)
Admission: EM | Disposition: A | Discharge status: Home / Self Care | Payer: Self-pay | Source: Home / Self Care | Attending: Emergency Medicine

## 2016-02-29 DIAGNOSIS — R079 Chest pain, unspecified: Secondary | ICD-10-CM

## 2016-02-29 DIAGNOSIS — I2510 Atherosclerotic heart disease of native coronary artery without angina pectoris: Secondary | ICD-10-CM | POA: Diagnosis not present

## 2016-02-29 DIAGNOSIS — I480 Paroxysmal atrial fibrillation: Secondary | ICD-10-CM

## 2016-02-29 HISTORY — PX: CARDIAC CATHETERIZATION: SHX172

## 2016-02-29 LAB — TROPONIN I: Troponin I: <0.03 ng/mL (ref <0.031)

## 2016-02-29 LAB — BASIC METABOLIC PANEL
Anion gap: 5 (ref 5–15)
BUN: 25 mg/dL — ABNORMAL HIGH (ref 6–20)
CO2: 23 mmol/L (ref 22–32)
Calcium: 8.7 mg/dL — ABNORMAL LOW (ref 8.9–10.3)
Chloride: 108 mmol/L (ref 101–111)
Creatinine, Ser: 1.46 mg/dL — ABNORMAL HIGH (ref 0.61–1.24)
GFR calc Af Amer: 51 mL/min — ABNORMAL LOW (ref >60)
GFR calc non Af Amer: 44 mL/min — ABNORMAL LOW (ref >60)
Glucose, Bld: 170 mg/dL — ABNORMAL HIGH (ref 65–99)
Potassium: 4.4 mmol/L (ref 3.5–5.1)
Sodium: 136 mmol/L (ref 135–145)

## 2016-02-29 LAB — CBC
HCT: 34.4 % — ABNORMAL LOW (ref 39.0–52.0)
Hemoglobin: 11 g/dL — ABNORMAL LOW (ref 13.0–17.0)
MCH: 27.1 pg (ref 26.0–34.0)
MCHC: 32 g/dL (ref 30.0–36.0)
MCV: 84.7 fL (ref 78.0–100.0)
Platelets: 98 10*3/uL — ABNORMAL LOW (ref 150–400)
RBC: 4.06 MIL/uL — ABNORMAL LOW (ref 4.22–5.81)
RDW: 15.2 % (ref 11.5–15.5)
WBC: 5.4 10*3/uL (ref 4.0–10.5)

## 2016-02-29 LAB — ECHOCARDIOGRAM COMPLETE
E decel time: 292 msec
FS: 27 % — AB (ref 28–44)
Height: 70 in
IVS/LV PW RATIO, ED: 1
LA ID, A-P, ES: 44 mm
LA diam end sys: 44 mm
LA diam index: 2.07 cm/m2
LA vol A4C: 96.1 ml
LA vol index: 44.3 mL/m2
LA vol: 94.4 mL
LV PW d: 10 mm — AB (ref 0.6–1.1)
LV e' LATERAL: 6.31 cm/s
LVOT area: 3.14 cm2
LVOT diameter: 20 mm
MV Dec: 292
MV pk E vel: 0.8 m/s
Reg peak vel: 200 cm/s
TDI e' lateral: 6.31
TDI e' medial: 107
TR max vel: 200 cm/s
Weight: 3343.94 oz

## 2016-02-29 LAB — PROTIME-INR
INR: 1.45 (ref 0.00–1.49)
Prothrombin Time: 17.7 seconds — ABNORMAL HIGH (ref 11.6–15.2)

## 2016-02-29 SURGERY — LEFT HEART CATH AND CORONARY ANGIOGRAPHY
Anesthesia: LOCAL

## 2016-02-29 MED ORDER — HEPARIN (PORCINE) IN NACL 2-0.9 UNIT/ML-% IJ SOLN
INTRAMUSCULAR | Status: DC | PRN
  Administered 2016-02-29: 1000 mL

## 2016-02-29 MED ORDER — SODIUM CHLORIDE 0.9 % WEIGHT BASED INFUSION: 1.0000 mL/kg/h | INTRAVENOUS | Status: AC

## 2016-02-29 MED ORDER — SODIUM CHLORIDE 0.9 % IV SOLN: 250.0000 mL | INTRAVENOUS | Status: DC | PRN

## 2016-02-29 MED ORDER — ACETAMINOPHEN 325 MG PO TABS: 650.0000 mg | ORAL_TABLET | ORAL | Status: DC | PRN

## 2016-02-29 MED ORDER — LIDOCAINE HCL (PF) 1 % IJ SOLN
INTRAMUSCULAR | Status: AC
  Filled 2016-02-29: qty 30

## 2016-02-29 MED ORDER — ONDANSETRON HCL 4 MG/2ML IJ SOLN: 4.0000 mg | Freq: Four times a day (QID) | INTRAMUSCULAR | Status: DC | PRN

## 2016-02-29 MED ORDER — RIVAROXABAN 20 MG PO TABS: 20.0000 mg | ORAL_TABLET | Freq: Every day | ORAL | Status: DC

## 2016-02-29 MED ORDER — FENTANYL CITRATE (PF) 100 MCG/2ML IJ SOLN
INTRAMUSCULAR | Status: DC | PRN
  Administered 2016-02-29: 25 ug via INTRAVENOUS

## 2016-02-29 MED ORDER — MORPHINE SULFATE (PF) 2 MG/ML IV SOLN: 2.0000 mg | Freq: Once | INTRAVENOUS | Status: DC

## 2016-02-29 MED ORDER — MIDAZOLAM HCL 2 MG/2ML IJ SOLN
INTRAMUSCULAR | Status: AC
  Filled 2016-02-29: qty 2

## 2016-02-29 MED ORDER — VERAPAMIL HCL 2.5 MG/ML IV SOLN
INTRAVENOUS | Status: DC | PRN
  Administered 2016-02-29: 10 mL via INTRA_ARTERIAL

## 2016-02-29 MED ORDER — FENTANYL CITRATE (PF) 100 MCG/2ML IJ SOLN
INTRAMUSCULAR | Status: AC
  Filled 2016-02-29: qty 2

## 2016-02-29 MED ORDER — MIDAZOLAM HCL 2 MG/2ML IJ SOLN
INTRAMUSCULAR | Status: DC | PRN
  Administered 2016-02-29: 2 mg via INTRAVENOUS

## 2016-02-29 MED ORDER — SODIUM CHLORIDE 0.9% FLUSH: 3.0000 mL | INTRAVENOUS | Status: DC | PRN

## 2016-02-29 MED ORDER — METFORMIN HCL ER 500 MG PO TB24: 500.0000 mg | ORAL_TABLET | Freq: Two times a day (BID) | ORAL | Status: DC

## 2016-02-29 MED ORDER — HEPARIN (PORCINE) IN NACL 2-0.9 UNIT/ML-% IJ SOLN
INTRAMUSCULAR | Status: AC
  Filled 2016-02-29: qty 1000

## 2016-02-29 MED ORDER — IOPAMIDOL (ISOVUE-370) INJECTION 76%
INTRAVENOUS | Status: DC | PRN
  Administered 2016-02-29: 45 mL via INTRA_ARTERIAL

## 2016-02-29 MED ORDER — HEPARIN SODIUM (PORCINE) 1000 UNIT/ML IJ SOLN
INTRAMUSCULAR | Status: DC | PRN
  Administered 2016-02-29: 4500 [IU] via INTRAVENOUS

## 2016-02-29 MED ORDER — VERAPAMIL HCL 2.5 MG/ML IV SOLN
INTRAVENOUS | Status: AC
  Filled 2016-02-29: qty 2

## 2016-02-29 MED ORDER — ACETAMINOPHEN 325 MG PO TABS
650.0000 mg | ORAL_TABLET | ORAL | Status: DC | PRN
  Administered 2016-02-29: 650 mg via ORAL
  Filled 2016-02-29: qty 2

## 2016-02-29 MED ORDER — SODIUM CHLORIDE 0.9% FLUSH: 3.0000 mL | Freq: Two times a day (BID) | INTRAVENOUS | Status: DC

## 2016-02-29 SURGICAL SUPPLY — 9 items
CATH INFINITI 5FR JL4 (CATHETERS) ×2 IMPLANT
CATH INFINITI JR4 5F (CATHETERS) ×2 IMPLANT
DEVICE RAD COMP TR BAND LRG (VASCULAR PRODUCTS) ×2 IMPLANT
GLIDESHEATH SLEND SS 6F .021 (SHEATH) ×2 IMPLANT
KIT HEART LEFT (KITS) ×2 IMPLANT
PACK CARDIAC CATHETERIZATION (CUSTOM PROCEDURE TRAY) ×2 IMPLANT
TRANSDUCER W/STOPCOCK (MISCELLANEOUS) ×2 IMPLANT
TUBING CIL FLEX 10 FLL-RA (TUBING) ×2 IMPLANT
WIRE SAFE-T 1.5MM-J .035X260CM (WIRE) ×2 IMPLANT

## 2016-02-29 NOTE — H&P (View-Only) (Signed)
SUBJECTIVE:  Still with chest pain this morning when he got up to bathe a little.  Currently pain free.    PHYSICAL EXAM Filed Vitals:   02/28/16 1745 02/28/16 1825 02/28/16 1949 02/29/16 0500  BP: 127/65 127/65 129/67 158/69  Pulse: 69 70 65 58  Temp:  97.8 F (36.6 C) 97.5 F (36.4 C) 97.6 F (36.4 C)  TempSrc:  Oral Oral Oral  Resp: 20 16 18 18   Height:  5\' 10"  (1.778 m)    Weight:    208 lb 15.9 oz (94.8 kg)  SpO2: 97% 99% 98% 98%   General:  No distress Lungs:  Clear Heart:  RRR, no rub Abdomen:  Positive bowel sounds, no rebound no guarding Extremities:  No edema  LABS: Lab Results  Component Value Date   TROPONINI <0.03 02/29/2016   Results for orders placed or performed during the hospital encounter of 02/28/16 (from the past 24 hour(s))  Basic metabolic panel     Status: Abnormal   Collection Time: 02/28/16 10:48 AM  Result Value Ref Range   Sodium 136 135 - 145 mmol/L   Potassium 5.8 (H) 3.5 - 5.1 mmol/L   Chloride 107 101 - 111 mmol/L   CO2 21 (L) 22 - 32 mmol/L   Glucose, Bld 252 (H) 65 - 99 mg/dL   BUN 33 (H) 6 - 20 mg/dL   Creatinine, Ser 1.61 (H) 0.61 - 1.24 mg/dL   Calcium 9.1 8.9 - 10.3 mg/dL   GFR calc non Af Amer 39 (L) >60 mL/min   GFR calc Af Amer 45 (L) >60 mL/min   Anion gap 8 5 - 15  CBC     Status: Abnormal   Collection Time: 02/28/16 10:48 AM  Result Value Ref Range   WBC 6.4 4.0 - 10.5 K/uL   RBC 4.38 4.22 - 5.81 MIL/uL   Hemoglobin 11.7 (L) 13.0 - 17.0 g/dL   HCT 37.2 (L) 39.0 - 52.0 %   MCV 84.9 78.0 - 100.0 fL   MCH 26.7 26.0 - 34.0 pg   MCHC 31.5 30.0 - 36.0 g/dL   RDW 15.2 11.5 - 15.5 %   Platelets 122 (L) 150 - 400 K/uL  I-stat troponin, ED     Status: None   Collection Time: 02/28/16 10:53 AM  Result Value Ref Range   Troponin i, poc 0.00 0.00 - 0.08 ng/mL   Comment 3          I-stat chem 8, ed     Status: Abnormal   Collection Time: 02/28/16  1:41 PM  Result Value Ref Range   Sodium 139 135 - 145 mmol/L   Potassium 5.6 (H) 3.5 - 5.1 mmol/L   Chloride 107 101 - 111 mmol/L   BUN 32 (H) 6 - 20 mg/dL   Creatinine, Ser 1.40 (H) 0.61 - 1.24 mg/dL   Glucose, Bld 157 (H) 65 - 99 mg/dL   Calcium, Ion 1.11 (L) 1.13 - 1.30 mmol/L   TCO2 21 0 - 100 mmol/L   Hemoglobin 11.6 (L) 13.0 - 17.0 g/dL   HCT 34.0 (L) 39.0 - 52.0 %  Troponin I     Status: None   Collection Time: 02/28/16  5:32 PM  Result Value Ref Range   Troponin I <0.03 <0.031 ng/mL  Troponin I     Status: None   Collection Time: 02/28/16 10:08 PM  Result Value Ref Range   Troponin I <0.03 <0.031 ng/mL  Troponin I  Status: None   Collection Time: 02/29/16  3:50 AM  Result Value Ref Range   Troponin I <0.03 <0.031 ng/mL  Basic metabolic panel     Status: Abnormal   Collection Time: 02/29/16  3:50 AM  Result Value Ref Range   Sodium 136 135 - 145 mmol/L   Potassium 4.4 3.5 - 5.1 mmol/L   Chloride 108 101 - 111 mmol/L   CO2 23 22 - 32 mmol/L   Glucose, Bld 170 (H) 65 - 99 mg/dL   BUN 25 (H) 6 - 20 mg/dL   Creatinine, Ser 1.46 (H) 0.61 - 1.24 mg/dL   Calcium 8.7 (L) 8.9 - 10.3 mg/dL   GFR calc non Af Amer 44 (L) >60 mL/min   GFR calc Af Amer 51 (L) >60 mL/min   Anion gap 5 5 - 15  CBC     Status: Abnormal   Collection Time: 02/29/16  3:50 AM  Result Value Ref Range   WBC 5.4 4.0 - 10.5 K/uL   RBC 4.06 (L) 4.22 - 5.81 MIL/uL   Hemoglobin 11.0 (L) 13.0 - 17.0 g/dL   HCT 34.4 (L) 39.0 - 52.0 %   MCV 84.7 78.0 - 100.0 fL   MCH 27.1 26.0 - 34.0 pg   MCHC 32.0 30.0 - 36.0 g/dL   RDW 15.2 11.5 - 15.5 %   Platelets 98 (L) 150 - 400 K/uL  Protime-INR     Status: Abnormal   Collection Time: 02/29/16  3:50 AM  Result Value Ref Range   Prothrombin Time 17.7 (H) 11.6 - 15.2 seconds   INR 1.45 0.00 - 1.49    Intake/Output Summary (Last 24 hours) at 02/29/16 1022 Last data filed at 02/29/16 0900  Gross per 24 hour  Intake   1242 ml  Output   1450 ml  Net   -208 ml   EKG:  NSR, rate 66, RAD, early transition.  No acute ST T  wave changes.  02/29/2016  ASSESSMENT AND PLAN:  UNSTABLE ANGINA:  Cath today.    PAF:   Xarelto on hold for cath.   HTN:   BP is high this morning.  However, it has been running low.  No change in therapy.    HYPERKALEMIA:  Potassium improved.    CKD:   Creat is stable.  No LV gram at cath.   Jeneen Rinks [facility] 02/29/2016 10:22 AM

## 2016-02-29 NOTE — Progress Notes (Signed)
Pt discharged home.  Discharge instructions, post cath care instructions, medication list, and follow up appointments given with wife and daughter present.  Pt and family verbalized understanding.  Pt escorted to main entrance by NT, to be driven home by his wife.  Jodell Cipro

## 2016-02-29 NOTE — Progress Notes (Signed)
Echocardiogram 2D Echocardiogram has been performed.  Joelene Millin 02/29/2016, 12:16 PM

## 2016-02-29 NOTE — Progress Notes (Signed)
Results for BRACH, PITZEN (MRN [id number]) as of 02/29/2016 11:15  Ref. Range 04/06/2015 10:46 09/22/2015 07:16 10/06/2015 05:58 10/06/2015 21:03 10/07/2015 06:27  Glucose-Capillary Latest Ref Range: 65-99 mg/dL 173 (H) 222 (H) 181 (H) 183 (H) 141 (H)  Noted that patient is on Amaryl and metformin at home.   Recommend starting Novolog SENSITIVE correction scale TID & HS if eating and every 4 hours when NPO while in the hospital. Harvel Ricks RN BSN CDE

## 2016-02-29 NOTE — Plan of Care (Signed)
Problem: Education: Goal: Knowledge of [facility] Education information/materials will improve Outcome: Progressing Patient aware of plan of care, states understanding.

## 2016-02-29 NOTE — Progress Notes (Signed)
SUBJECTIVE:  Still with chest pain this morning when he got up to bathe a little.  Currently pain free.    PHYSICAL EXAM Filed Vitals:   02/28/16 1745 02/28/16 1825 02/28/16 1949 02/29/16 0500  BP: 127/65 127/65 129/67 158/69  Pulse: 69 70 65 58  Temp:  97.8 F (36.6 C) 97.5 F (36.4 C) 97.6 F (36.4 C)  TempSrc:  Oral Oral Oral  Resp: 20 16 18 18   Height:  5\' 10"  (1.778 m)    Weight:    208 lb 15.9 oz (94.8 kg)  SpO2: 97% 99% 98% 98%   General:  No distress Lungs:  Clear Heart:  RRR, no rub Abdomen:  Positive bowel sounds, no rebound no guarding Extremities:  No edema  LABS: Lab Results  Component Value Date   TROPONINI <0.03 02/29/2016   Results for orders placed or performed during the hospital encounter of 02/28/16 (from the past 24 hour(s))  Basic metabolic panel     Status: Abnormal   Collection Time: 02/28/16 10:48 AM  Result Value Ref Range   Sodium 136 135 - 145 mmol/L   Potassium 5.8 (H) 3.5 - 5.1 mmol/L   Chloride 107 101 - 111 mmol/L   CO2 21 (L) 22 - 32 mmol/L   Glucose, Bld 252 (H) 65 - 99 mg/dL   BUN 33 (H) 6 - 20 mg/dL   Creatinine, Ser 1.61 (H) 0.61 - 1.24 mg/dL   Calcium 9.1 8.9 - 10.3 mg/dL   GFR calc non Af Amer 39 (L) >60 mL/min   GFR calc Af Amer 45 (L) >60 mL/min   Anion gap 8 5 - 15  CBC     Status: Abnormal   Collection Time: 02/28/16 10:48 AM  Result Value Ref Range   WBC 6.4 4.0 - 10.5 K/uL   RBC 4.38 4.22 - 5.81 MIL/uL   Hemoglobin 11.7 (L) 13.0 - 17.0 g/dL   HCT 37.2 (L) 39.0 - 52.0 %   MCV 84.9 78.0 - 100.0 fL   MCH 26.7 26.0 - 34.0 pg   MCHC 31.5 30.0 - 36.0 g/dL   RDW 15.2 11.5 - 15.5 %   Platelets 122 (L) 150 - 400 K/uL  I-stat troponin, ED     Status: None   Collection Time: 02/28/16 10:53 AM  Result Value Ref Range   Troponin i, poc 0.00 0.00 - 0.08 ng/mL   Comment 3          I-stat chem 8, ed     Status: Abnormal   Collection Time: 02/28/16  1:41 PM  Result Value Ref Range   Sodium 139 135 - 145 mmol/L   Potassium 5.6 (H) 3.5 - 5.1 mmol/L   Chloride 107 101 - 111 mmol/L   BUN 32 (H) 6 - 20 mg/dL   Creatinine, Ser 1.40 (H) 0.61 - 1.24 mg/dL   Glucose, Bld 157 (H) 65 - 99 mg/dL   Calcium, Ion 1.11 (L) 1.13 - 1.30 mmol/L   TCO2 21 0 - 100 mmol/L   Hemoglobin 11.6 (L) 13.0 - 17.0 g/dL   HCT 34.0 (L) 39.0 - 52.0 %  Troponin I     Status: None   Collection Time: 02/28/16  5:32 PM  Result Value Ref Range   Troponin I <0.03 <0.031 ng/mL  Troponin I     Status: None   Collection Time: 02/28/16 10:08 PM  Result Value Ref Range   Troponin I <0.03 <0.031 ng/mL  Troponin I  Status: None   Collection Time: 02/29/16  3:50 AM  Result Value Ref Range   Troponin I <0.03 <0.031 ng/mL  Basic metabolic panel     Status: Abnormal   Collection Time: 02/29/16  3:50 AM  Result Value Ref Range   Sodium 136 135 - 145 mmol/L   Potassium 4.4 3.5 - 5.1 mmol/L   Chloride 108 101 - 111 mmol/L   CO2 23 22 - 32 mmol/L   Glucose, Bld 170 (H) 65 - 99 mg/dL   BUN 25 (H) 6 - 20 mg/dL   Creatinine, Ser 1.46 (H) 0.61 - 1.24 mg/dL   Calcium 8.7 (L) 8.9 - 10.3 mg/dL   GFR calc non Af Amer 44 (L) >60 mL/min   GFR calc Af Amer 51 (L) >60 mL/min   Anion gap 5 5 - 15  CBC     Status: Abnormal   Collection Time: 02/29/16  3:50 AM  Result Value Ref Range   WBC 5.4 4.0 - 10.5 K/uL   RBC 4.06 (L) 4.22 - 5.81 MIL/uL   Hemoglobin 11.0 (L) 13.0 - 17.0 g/dL   HCT 34.4 (L) 39.0 - 52.0 %   MCV 84.7 78.0 - 100.0 fL   MCH 27.1 26.0 - 34.0 pg   MCHC 32.0 30.0 - 36.0 g/dL   RDW 15.2 11.5 - 15.5 %   Platelets 98 (L) 150 - 400 K/uL  Protime-INR     Status: Abnormal   Collection Time: 02/29/16  3:50 AM  Result Value Ref Range   Prothrombin Time 17.7 (H) 11.6 - 15.2 seconds   INR 1.45 0.00 - 1.49    Intake/Output Summary (Last 24 hours) at 02/29/16 1022 Last data filed at 02/29/16 0900  Gross per 24 hour  Intake   1242 ml  Output   1450 ml  Net   -208 ml   EKG:  NSR, rate 66, RAD, early transition.  No acute ST T  wave changes.  02/29/2016  ASSESSMENT AND PLAN:  UNSTABLE ANGINA:  Cath today.    PAF:   Xarelto on hold for cath.   HTN:   BP is high this morning.  However, it has been running low.  No change in therapy.    HYPERKALEMIA:  Potassium improved.    CKD:   Creat is stable.  No LV gram at cath.   Jeneen Rinks [facility] 02/29/2016 10:22 AM

## 2016-02-29 NOTE — Discharge Summary (Signed)
Patient ID: Adrian Singh,  MRN: [id number], DOB/AGE: [birth year] 82 y.o.  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 Pt was admitted and Xarelto was held. Troponin's were negative. He underwent coronary angiogram 02/29/16 with revealed a patent CFX stent and no other significant CAD progression. He will be discharged later on the 14th as long as he is stable post cath. Resume Xarelto tomorrow, hold Glucophage till the 11 th.   Discharge Vitals:  Blood pressure 141/73, pulse 64, temperature 97.6 F (36.4 C), temperature source Oral, resp. rate 18, height '5\' 10"'  (1.778 m), weight 208 lb 15.9 oz (94.8 kg), SpO2 98 %.    Labs: Results for orders placed or performed during the hospital encounter of 02/28/16 (from the past 24 hour(s))  Troponin I     Status: None   Collection Time: 02/28/16 10:08 PM  Result Value Ref Range   Troponin I <0.03 <0.031 ng/mL  Troponin I     Status: None   Collection Time: 02/29/16  3:50 AM  Result Value Ref Range   Troponin I <0.03 <0.031 ng/mL  Basic metabolic panel     Status: Abnormal   Collection Time: 02/29/16  3:50 AM  Result Value Ref Range   Sodium 136 135 - 145 mmol/L   Potassium 4.4 3.5 - 5.1 mmol/L   Chloride 108 101 - 111  mmol/L   CO2 23 22 - 32 mmol/L   Glucose, Bld 170 (H) 65 - 99 mg/dL   BUN 25 (H) 6 - 20 mg/dL   Creatinine, Ser 1.46 (H) 0.61 - 1.24 mg/dL   Calcium 8.7 (L) 8.9 - 10.3 mg/dL   GFR calc non Af Amer 44 (L) >60 mL/min   GFR calc Af Amer 51 (L) >60 mL/min   Anion gap 5 5 - 15  CBC     Status: Abnormal   Collection Time: 02/29/16  3:50 AM  Result Value Ref Range   WBC 5.4 4.0 - 10.5 K/uL   RBC 4.06 (L) 4.22 - 5.81 MIL/uL   Hemoglobin 11.0 (L) 13.0 - 17.0 g/dL   HCT 34.4 (L) 39.0 - 52.0 %   MCV 84.7 78.0 - 100.0 fL   MCH 27.1 26.0 - 34.0 pg   MCHC 32.0 30.0 - 36.0 g/dL   RDW 15.2 11.5 - 15.5 %   Platelets 98 (L) 150 - 400 K/uL  Protime-INR     Status: Abnormal   Collection Time: 02/29/16  3:50 AM  Result Value Ref Range   Prothrombin Time 17.7 (H) 11.6 - 15.2 seconds   INR 1.45 0.00 - 1.49    Disposition:      Follow-up Information    Follow up with Peter Martinique, MD.   Specialty:  Cardiology   Why:  office will contact you  Contact information:   [address] [place] [phone]       Discharge Medications:    Medication List    STOP taking these medications        naproxen sodium 220 MG tablet  Commonly known as:  ANAPROX      TAKE these medications        acetaminophen 325 MG tablet  Commonly known as:  TYLENOL  Take 2 tablets (650 mg total) by mouth every 4 (four) hours as needed for headache or mild pain.     atorvastatin 40 MG tablet  Commonly known as:  LIPITOR  Take 1 tablet (40 mg total) by mouth daily.     BAYER MICROLET LANCETS lancets  Use to test blood sugar once daily dx: E11.40     clopidogrel 75 MG tablet  Commonly known as:  PLAVIX  Take 1 tablet (75 mg total) by mouth daily with breakfast.     CONTOUR NEXT EZ MONITOR w/Device Kit  Use to test blood sugar once daily dx: E11.40     finasteride 5 MG tablet  Commonly known as:  PROSCAR  TAKE 1 TABLET (5 MG TOTAL) BY MOUTH DAILY.     glimepiride 4 MG  tablet  Commonly known as:  AMARYL  TAKE ONE TABLET BY MOUTH WITH BREAKFAST OR FIRST MAIN MEAL OF THE DAY     glucose blood test strip  Commonly known as:  BAYER CONTOUR NEXT TEST  Use to test blood sugar once daily dx: E11.40     metFORMIN 500 MG 24 hr tablet  Commonly known as:  GLUCOPHAGE-XR  Take 1 tablet (500 mg total) by mouth 2 (two) times daily.  Start taking on:  03/03/2016     metoprolol succinate 100 MG 24 hr tablet  Commonly known as:  TOPROL-XL  Take 1 tablet (100 mg total) by mouth daily. Take with or immediately following a meal.     multivitamin with minerals Tabs tablet  Take 1 tablet by mouth daily.     nitroGLYCERIN 0.4 MG SL tablet  Commonly known as:  NITROSTAT  Place 1 tablet (0.4 mg total) under the tongue every 5 (five) minutes as needed for chest pain.     pantoprazole 40 MG tablet  Commonly known as:  PROTONIX  Take 40 mg by mouth daily.     ramipril 2.5 MG capsule  Commonly known as:  ALTACE  TAKE 1 CAPSULE (2.5 MG TOTAL) BY MOUTH DAILY.     rivaroxaban 20 MG Tabs tablet  Commonly known as:  XARELTO  Take 1 tablet (20 mg total) by mouth daily with supper.  Start taking on:  03/01/2016     tamsulosin 0.4 MG Caps capsule  Commonly known as:  FLOMAX  Take 1 capsule (0.4 mg total) by mouth daily.     traMADol 50 MG tablet  Commonly known as:  ULTRAM  TAKE ONE TABLET BY MOUTH THREE TIMES DAILY AS NEEDED     triamterene-hydrochlorothiazide 37.5-25 MG tablet  Commonly known as:  MAXZIDE-25  Take 1 tablet by mouth daily.     Vitamin B Complex Tabs  Take 1 tablet by mouth 3 (three) times a week.     Vitamin D 2000 units Caps  Take 2,000 Units by mouth daily.         Duration of Discharge Encounter: Greater than 30 minutes including physician time.  Angelena Form PA-C 02/29/2016 5:35 PM

## 2016-02-29 NOTE — Care Management Obs Status (Signed)
[place] NOTIFICATION   Patient Details  Name: ALANSON DUMONT MRN: [id number] Date of Birth: 03/09/1934   Medicare Observation Status Notification Given:  Yes    [name], RN 02/29/2016, 10:49 AM

## 2016-02-29 NOTE — Interval H&P Note (Signed)
Cath Lab Visit (complete for each Cath Lab visit)  Clinical Evaluation Leading to the Procedure:   ACS: Yes.    Non-ACS:    Anginal Classification: CCS IV  Anti-ischemic medical therapy: Minimal Therapy (1 class of medications)  Non-Invasive Test Results: No non-invasive testing performed  Prior CABG: No previous CABG      History and Physical Interval Note:  02/29/2016 3:19 PM  Adrian Singh  has presented today for surgery, with the diagnosis of cp  The various methods of treatment have been discussed with the patient and family. After consideration of risks, benefits and other options for treatment, the patient has consented to  Procedure(s): Left Heart Cath and Coronary Angiography (N/A) as a surgical intervention .  The patient's history has been reviewed, patient examined, no change in status, stable for surgery.  I have reviewed the patient's chart and labs.  Questions were answered to the patient's satisfaction.     Larae Grooms

## 2016-02-29 NOTE — Discharge Instructions (Signed)
Radial Site Care °Refer to this sheet in the next few weeks. These instructions provide you with information about caring for yourself after your procedure. Your health care provider may also give you more specific instructions. Your treatment has been planned according to current medical practices, but problems sometimes occur. Call your health care provider if you have any problems or questions after your procedure. °WHAT TO EXPECT AFTER THE PROCEDURE °After your procedure, it is typical to have the following: °· Bruising at the radial site that usually fades within 1-2 weeks. °· Blood collecting in the tissue (hematoma) that may be painful to the touch. It should usually decrease in size and tenderness within 1-2 weeks. °HOME CARE INSTRUCTIONS °· Take medicines only as directed by your health care provider. °· You may shower 24-48 hours after the procedure or as directed by your health care provider. Remove the bandage (dressing) and gently wash the site with plain soap and water. Pat the area dry with a clean towel. Do not rub the site, because this may cause bleeding. °· Do not take baths, swim, or use a hot tub until your health care provider approves. °· Check your insertion site every day for redness, swelling, or drainage. °· Do not apply powder or lotion to the site. °· Do not flex or bend the affected arm for 24 hours or as directed by your health care provider. °· Do not push or pull heavy objects with the affected arm for 24 hours or as directed by your health care provider. °· Do not lift over 10 lb (4.5 kg) for 5 days after your procedure or as directed by your health care provider. °· Ask your health care provider when it is okay to: °¨ Return to work or school. °¨ Resume usual physical activities or sports. °¨ Resume sexual activity. °· Do not drive home if you are discharged the same day as the procedure. Have someone else drive you. °· You may drive 24 hours after the procedure unless otherwise  instructed by your health care provider. °· Do not operate machinery or power tools for 24 hours after the procedure. °· If your procedure was done as an outpatient procedure, which means that you went home the same day as your procedure, a responsible adult should be with you for the first 24 hours after you arrive home. °· Keep all follow-up visits as directed by your health care provider. This is important. °SEEK MEDICAL CARE IF: °· You have a fever. °· You have chills. °· You have increased bleeding from the radial site. Hold pressure on the site. °SEEK IMMEDIATE MEDICAL CARE IF: °· You have unusual pain at the radial site. °· You have redness, warmth, or swelling at the radial site. °· You have drainage (other than a small amount of blood on the dressing) from the radial site. °· The radial site is bleeding, and the bleeding does not stop after 30 minutes of holding steady pressure on the site. °· Your arm or hand becomes pale, cool, tingly, or numb. °  °This information is not intended to replace advice given to you by your health care provider. Make sure you discuss any questions you have with your health care provider. °  °Document Released: 10/06/2010 Document Revised: 09/24/2014 Document Reviewed: 03/22/2014 °Elsevier Interactive Patient Education ©2016 [employer] Inc[employer] ° °

## 2016-02-29 NOTE — Research (Signed)
LEADERS FREE II Informed Consent   Subject Name: Adrian Singh  Subject met inclusion and exclusion criteria.  The informed consent form, study requirements and expectations were reviewed with the subject and questions and concerns were addressed prior to the signing of the consent form.  The subject verbalized understanding of the trial requirements.  The subject agreed to participate in the LEADERS FREE II  trial and signed the informed consent.  The informed consent was obtained prior to performance of any protocol-specific procedures for the subject.  A copy of the signed informed consent was given to the subject and a copy was placed in the subject's medical record.  Marlana Salvage 02/29/2016, 09:20 AM

## 2016-03-01 ENCOUNTER — Ambulatory Visit (INDEPENDENT_AMBULATORY_CARE_PROVIDER_SITE_OTHER): Payer: Medicare Other | Admitting: Internal Medicine

## 2016-03-01 ENCOUNTER — Telehealth: Payer: Self-pay | Admitting: Cardiology

## 2016-03-01 ENCOUNTER — Encounter (HOSPITAL_COMMUNITY): Payer: Self-pay | Admitting: Interventional Cardiology

## 2016-03-01 ENCOUNTER — Telehealth: Payer: Self-pay

## 2016-03-01 VITALS — BP 131/73 | HR 79 | Ht 70.0 in

## 2016-03-01 DIAGNOSIS — R0789 Other chest pain: Secondary | ICD-10-CM | POA: Diagnosis not present

## 2016-03-01 DIAGNOSIS — I2510 Atherosclerotic heart disease of native coronary artery without angina pectoris: Secondary | ICD-10-CM

## 2016-03-01 MED ORDER — IBUPROFEN 800 MG PO TABS: 800.0000 mg | ORAL_TABLET | Freq: Two times a day (BID) | ORAL | Status: DC

## 2016-03-01 MED FILL — Lidocaine HCl Local Preservative Free (PF) Inj 1%: INTRAMUSCULAR | Qty: 30 | Status: AC

## 2016-03-01 NOTE — Progress Notes (Signed)
OFFICE NOTE  Chief Complaint:  Chest pain  Primary Care Physician: Viviana Simpler, MD  HPI:  Adrian Singh is a 82 y.o. male patient of Dr. Martinique with a history of coronary artery disease and a stent to the circumflex artery in January 2017. Recently he had some crescendo angina symptoms with chest pain and left arm pain. He was admitted and ruled out for MI but underwent repeat catheterization through a left radial approach. This catheterization was performed yesterday and demonstrated a patent circumflex stent and nonobstructive coronary disease in the right coronary artery and LAD. He was discharged later last evening and in fact still has a Tegaderm over his left radial cath site. This morning he called his primary care provider with worsening chest discomfort and they referred him to our office. He was added onto my schedule today in a DOD slot. Adrian Singh describes left arm pain that radiates down from his neck and left shoulder. He also has some left anterior chest pain toward the left shoulder. This is reproducible on exam and worse when extending the left arm up above the shoulder. I examined the left radial calf site which was intact and there was no evidence of bruit, ecchymosis or decreased pulse or coolness of the hand.  PMHx:  Past Medical History  Diagnosis Date  . Hypertrophy of prostate without urinary obstruction and other lower urinary tract symptoms (LUTS)   . Esophageal reflux   . Unspecified essential hypertension   . Atrial fibrillation ([place])     x1  . CAD (coronary artery disease)     nonobstructive  . Thrombocytopenia ([place])   . Complication of anesthesia     "03/2015 didn't get sick but lost my appetite for a couple months" (10/06/2015)  . Type II diabetes mellitus ([place])   . Stroke [facility]) 03/2014    "had a series of mini strokes; maybe 4"; denies residual on 10/06/2015  . Osteoarthrosis, unspecified whether generalized or localized, unspecified site   .  Arthritis     "legs, back" (10/06/2015)  . Chronic kidney disease, stage III (moderate)   . Kidney stones   . Cancer ([place])     skin cancer on ear (froze it off) and back (cut it off)    Past Surgical History  Procedure Laterality Date  . Esophagogastroduodenoscopy (egd) with esophageal dilation  2001    with dilation  . Back surgery    . Shoulder arthroscopy w/ rotator cuff repair Left 2005  . Cardiac catheterization  12/2007  . Colonoscopy w/ biopsies and polypectomy    . Ear cyst excision N/A 04/06/2015    Procedure: EXCISION OF SCALP CYST;  Surgeon: Donnie Mesa, MD;  Location: [place];  Service: General;  Laterality: N/A;  . Cardiac catheterization N/A 09/22/2015    Procedure: Left Heart Cath and Coronary Angiography;  Surgeon: Peter M Martinique, MD;  Location: [place] CV LAB;  Service: Cardiovascular;  Laterality: N/A;  . Cardiac catheterization N/A 10/06/2015    Procedure: Coronary Stent Intervention;  Surgeon: Peter M Martinique, MD;  Location: [place] [facility] LAB;  Service: Cardiovascular;  Laterality: N/A;  . Lumbar disc surgery  09/26/1999    "cleaned out arthritis and bone spurs"  . Coronary stent placement  10/06/2015    [name]  with DES  . Cardiac catheterization N/A 02/29/2016    Procedure: Left Heart Cath and Coronary Angiography;  Surgeon: Jettie Booze, MD;  Location: [place] CV LAB;  Service: Cardiovascular;  Laterality: N/A;    FAMHx:  Family History  Problem Relation Age of Onset  . Stroke Father   . Peripheral vascular disease Father     amputation  . Heart failure Mother     CHF  . Coronary artery disease Mother   . Heart attack Mother 63    Multiple  . COPD Brother   . Heart disease Brother   . Cancer Brother     Bone  . Lung cancer Sister     SOCHx:   reports that he has quit smoking. His smoking use included Cigarettes. He quit after 3 years of use. He has never used smokeless tobacco. He reports that he does not drink alcohol or use illicit  drugs.  ALLERGIES:  Allergies  Allergen Reactions  . Doxazosin Other (See Comments)    dizziness    ROS: Pertinent items noted in HPI and remainder of comprehensive ROS otherwise negative.  HOME MEDS: Current Outpatient Prescriptions  Medication Sig Dispense Refill  . acetaminophen (TYLENOL) 325 MG tablet Take 2 tablets (650 mg total) by mouth every 4 (four) hours as needed for headache or mild pain.    Marland Kitchen atorvastatin (LIPITOR) 40 MG tablet Take 1 tablet (40 mg total) by mouth daily. 30 tablet 5  . B Complex Vitamins (VITAMIN B COMPLEX) TABS Take 1 tablet by mouth 3 (three) times a week.     Marland Kitchen BAYER MICROLET LANCETS lancets Use to test blood sugar once daily dx: E11.40 100 each 1  . Blood Glucose Monitoring Suppl (CONTOUR NEXT EZ MONITOR) w/Device KIT Use to test blood sugar once daily dx: E11.40 1 kit 0  . Cholecalciferol (VITAMIN D) 2000 UNITS CAPS Take 2,000 Units by mouth daily.     . clopidogrel (PLAVIX) 75 MG tablet Take 1 tablet (75 mg total) by mouth daily with breakfast. 30 tablet 5  . finasteride (PROSCAR) 5 MG tablet TAKE 1 TABLET (5 MG TOTAL) BY MOUTH DAILY. 90 tablet 3  . glimepiride (AMARYL) 4 MG tablet TAKE ONE TABLET BY MOUTH WITH BREAKFAST OR FIRST MAIN MEAL OF THE DAY 90 tablet 1  . glucose blood (BAYER CONTOUR NEXT TEST) test strip Use to test blood sugar once daily dx: E11.40 100 each 1  . [START ON 03/03/2016] metFORMIN (GLUCOPHAGE-XR) 500 MG 24 hr tablet Take 1 tablet (500 mg total) by mouth 2 (two) times daily. 360 tablet 3  . metoprolol succinate (TOPROL-XL) 100 MG 24 hr tablet Take 1 tablet (100 mg total) by mouth daily. Take with or immediately following a meal. 90 tablet 3  . Multiple Vitamin (MULTIVITAMIN WITH MINERALS) TABS tablet Take 1 tablet by mouth daily.    . nitroGLYCERIN (NITROSTAT) 0.4 MG SL tablet Place 1 tablet (0.4 mg total) under the tongue every 5 (five) minutes as needed for chest pain. 25 tablet 3  . pantoprazole (PROTONIX) 40 MG tablet Take 40  mg by mouth daily.    . ramipril (ALTACE) 2.5 MG capsule TAKE 1 CAPSULE (2.5 MG TOTAL) BY MOUTH DAILY.  3  . rivaroxaban (XARELTO) 20 MG TABS tablet Take 1 tablet (20 mg total) by mouth daily with supper. 30 tablet 1  . tamsulosin (FLOMAX) 0.4 MG CAPS capsule Take 1 capsule (0.4 mg total) by mouth daily. 90 capsule 3  . traMADol (ULTRAM) 50 MG tablet TAKE ONE TABLET BY MOUTH THREE TIMES DAILY AS NEEDED 30 tablet 0  . triamterene-hydrochlorothiazide (MAXZIDE-25) 37.5-25 MG tablet Take 1 tablet by mouth daily. 30 tablet 11  .  ibuprofen (ADVIL,MOTRIN) 800 MG tablet Take 1 tablet (800 mg total) by mouth 2 (two) times daily. Take for 1 week. 14 tablet 0   No current facility-administered medications for this visit.    LABS/IMAGING: Results for orders placed or performed during the hospital encounter of 02/28/16 (from the past 48 hour(s))  I-stat chem 8, ed     Status: Abnormal   Collection Time: 02/28/16  1:41 PM  Result Value Ref Range   Sodium 139 135 - 145 mmol/L   Potassium 5.6 (H) 3.5 - 5.1 mmol/L   Chloride 107 101 - 111 mmol/L   BUN 32 (H) 6 - 20 mg/dL   Creatinine, Ser 1.40 (H) 0.61 - 1.24 mg/dL   Glucose, Bld 157 (H) 65 - 99 mg/dL   Calcium, Ion 1.11 (L) 1.13 - 1.30 mmol/L   TCO2 21 0 - 100 mmol/L   Hemoglobin 11.6 (L) 13.0 - 17.0 g/dL   HCT 34.0 (L) 39.0 - 52.0 %  Troponin I     Status: None   Collection Time: 02/28/16  5:32 PM  Result Value Ref Range   Troponin I <0.03 <0.031 ng/mL    Comment:        NO INDICATION OF MYOCARDIAL INJURY.   Troponin I     Status: None   Collection Time: 02/28/16 10:08 PM  Result Value Ref Range   Troponin I <0.03 <0.031 ng/mL    Comment:        NO INDICATION OF MYOCARDIAL INJURY.   Troponin I     Status: None   Collection Time: 02/29/16  3:50 AM  Result Value Ref Range   Troponin I <0.03 <0.031 ng/mL    Comment:        NO INDICATION OF MYOCARDIAL INJURY.   Basic metabolic panel     Status: Abnormal   Collection Time: 02/29/16   3:50 AM  Result Value Ref Range   Sodium 136 135 - 145 mmol/L   Potassium 4.4 3.5 - 5.1 mmol/L    Comment: DELTA CHECK NOTED NO VISIBLE HEMOLYSIS    Chloride 108 101 - 111 mmol/L   CO2 23 22 - 32 mmol/L   Glucose, Bld 170 (H) 65 - 99 mg/dL   BUN 25 (H) 6 - 20 mg/dL   Creatinine, Ser 1.46 (H) 0.61 - 1.24 mg/dL   Calcium 8.7 (L) 8.9 - 10.3 mg/dL   GFR calc non Af Amer 44 (L) >60 mL/min   GFR calc Af Amer 51 (L) >60 mL/min    Comment: (NOTE) The eGFR has been calculated using the CKD EPI equation. This calculation has not been validated in all clinical situations. eGFR's persistently <60 mL/min signify possible Chronic Kidney Disease.    Anion gap 5 5 - 15  CBC     Status: Abnormal   Collection Time: 02/29/16  3:50 AM  Result Value Ref Range   WBC 5.4 4.0 - 10.5 K/uL   RBC 4.06 (L) 4.22 - 5.81 MIL/uL   Hemoglobin 11.0 (L) 13.0 - 17.0 g/dL   HCT 34.4 (L) 39.0 - 52.0 %   MCV 84.7 78.0 - 100.0 fL   MCH 27.1 26.0 - 34.0 pg   MCHC 32.0 30.0 - 36.0 g/dL   RDW 15.2 11.5 - 15.5 %   Platelets 98 (L) 150 - 400 K/uL    Comment: PLATELET COUNT CONFIRMED BY SMEAR  Protime-INR     Status: Abnormal   Collection Time: 02/29/16  3:50 AM  Result Value Ref  Range   Prothrombin Time 17.7 (H) 11.6 - 15.2 seconds   INR 1.45 0.00 - 1.49   No results found.  WEIGHTS: Wt Readings from Last 3 Encounters:  02/29/16 208 lb 15.9 oz (94.8 kg)  01/16/16 217 lb (98.431 kg)  01/12/16 221 lb 9.6 oz (100.517 kg)    VITALS: BP 131/73 mmHg  Pulse 79  Ht '5\' 10"'  (1.778 m)  EXAM: General appearance: alert and no distress Lungs: clear to auscultation bilaterally Heart: regular rate and rhythm Extremities: extremities normal, atraumatic, no cyanosis or edema Neurologic: Sensory: Pain on palpation over the left anterior chest and left shoulder Motor: Mildly reduced range of motion passively without the ability to raise the arm over the left shoulder but pain with active extension of the left  shoulder  EKG: Normal sinus rhythm at 79, voltage criteria for LVH, inferior infarct, unchanged from prior EKG during recent hospitalization  ASSESSMENT: 1. Reproducible musculoskeletal pain 2. Patent coronary arteries with a patent circumflex stent by cath on 02/29/16  PLAN: 1.   Mr. Winther has reproducible musculoskeletal pain which could be related to either cervicalgia perhaps a left shoulder impingement. This radiates down the left arm. He also has pain in the base of the neck. He may have some arthritis of the cervical spine. I recommend a trial of anti-inflammatory medication will prescribe ibuprofen 800 mg twice daily. He is advised to watch for any adverse bleeding since he's on Xarelto. I believe he can tolerate a week of anti-inflammatories without significant problems. He could also take tramadol as needed which she has a prescription for for breakthrough pain. He is also advised to consider placing hot packs over the base of the neck and left scapular area. If his symptoms do not improve I did advise evaluation by his primary care provider, x-rays in or physical therapy referral orthopedic evaluation.  Pixie Casino, MD, [facility] Attending Cardiologist [place] C Kou Gucciardo 03/01/2016, 1:34 PM

## 2016-03-01 NOTE — Telephone Encounter (Signed)
He had a reassuring cath at the hospital visit---so doubt this is ischemic Okay to just have OV with cardiologist (though pain may be non cardiac)

## 2016-03-01 NOTE — Telephone Encounter (Signed)
Pt walked in to schedule f/u appt with Dr Silvio Pate after d/c from [facility] on 02/29/16 with CP. Pt advised registrar that he was having CP now. Took pt to room and pt has been having CP all morning; pain level now 5 but earlier was 7. Lt sided CP goes down lt arm, slight H/A,every time pt stands feels dizzy, sees spots and feels like going to pass out but pt has not passed out. Pt gets up slowly.pt to restart Xarelto tonight. Pt states he cannot take ntg due to  Causing h/a and drop in heart rate. T 98.1 P 78  Pulse ox 96 % room air and BP 110/68 rt arm reg cuff. Dr Silvio Pate not in office until this afternoon; no available appts at [facility]; spoke with Webb Silversmith NP and she advised if pt not in distress needs to see cardiologist today or go to ED. Called [phone] spoke with Alyse Low and she scheduled appt with Dr Debara Pickett and Alyse Low will call and give heads up pt is on his way to office. Pt has appt with Dr Debara Pickett 03/01/16 at 11:30 AM. pts wife is driving pt; pt declined 911. FYI to Dr Debara Pickett and Dr Silvio Pate.

## 2016-03-01 NOTE — Telephone Encounter (Signed)
Adrian Singh at front desk said Sheral Apley called to see if EKG was done. No EKG was done; pt walked in for appt; did not see provider at [facility].

## 2016-03-01 NOTE — Telephone Encounter (Signed)
Closed encounter °

## 2016-03-01 NOTE — Patient Instructions (Addendum)
Your physician has recommended you make the following change in your medication..  1. START ibuprofen 800mg  twice daily - take for ONE WEEK  Apply hot compresses/packs to neck & shoulder area  Please keep follow up appointments as planned  xarelto 20mg  samples - 2 bottles of 7 tablets - provided to patient @ appointment

## 2016-03-01 NOTE — Research (Signed)
LEADERS FREE II Informed Consent   Subject Name: Adrian Singh  Subject met inclusion and exclusion criteria.  The informed consent form, study requirements and expectations were reviewed with the subject and questions and concerns were addressed prior to the signing of the consent form.  The subject verbalized understanding of the trial requirements.  The subject agreed to participate in the LEADERS FREE II trial and signed the informed consent.  The informed consent was obtained prior to performance of any protocol-specific procedures for the subject.  A copy of the signed informed consent was given to the subject and a copy was placed in the subject's medical record.  Marlana Salvage 03/01/2016, 09:00

## 2016-03-15 ENCOUNTER — Ambulatory Visit: Payer: Medicare Other | Admitting: Physician Assistant

## 2016-03-15 ENCOUNTER — Other Ambulatory Visit: Payer: Self-pay | Admitting: Internal Medicine

## 2016-03-15 DIAGNOSIS — E1140 Type 2 diabetes mellitus with diabetic neuropathy, unspecified: Secondary | ICD-10-CM | POA: Diagnosis not present

## 2016-03-15 NOTE — Telephone Encounter (Signed)
Last filled 02-03-16 #30 Last OV 01-16-16 Next OV 04-02-16

## 2016-03-16 NOTE — Telephone Encounter (Signed)
Left refill on voice mail at pharmacy  

## 2016-03-16 NOTE — Telephone Encounter (Signed)
Approved: 30 x 0 

## 2016-03-19 ENCOUNTER — Ambulatory Visit (INDEPENDENT_AMBULATORY_CARE_PROVIDER_SITE_OTHER): Payer: Medicare Other | Admitting: Cardiology

## 2016-03-19 ENCOUNTER — Encounter: Payer: Self-pay | Admitting: Cardiology

## 2016-03-19 VITALS — BP 123/75 | HR 88 | Ht 70.0 in | Wt 209.0 lb

## 2016-03-19 DIAGNOSIS — Z7901 Long term (current) use of anticoagulants: Secondary | ICD-10-CM

## 2016-03-19 DIAGNOSIS — I480 Paroxysmal atrial fibrillation: Secondary | ICD-10-CM

## 2016-03-19 DIAGNOSIS — I2510 Atherosclerotic heart disease of native coronary artery without angina pectoris: Secondary | ICD-10-CM | POA: Diagnosis not present

## 2016-03-19 DIAGNOSIS — E1165 Type 2 diabetes mellitus with hyperglycemia: Secondary | ICD-10-CM

## 2016-03-19 DIAGNOSIS — IMO0002 Reserved for concepts with insufficient information to code with codable children: Secondary | ICD-10-CM

## 2016-03-19 DIAGNOSIS — I951 Orthostatic hypotension: Secondary | ICD-10-CM | POA: Diagnosis not present

## 2016-03-19 DIAGNOSIS — Z8673 Personal history of transient ischemic attack (TIA), and cerebral infarction without residual deficits: Secondary | ICD-10-CM

## 2016-03-19 DIAGNOSIS — Z9861 Coronary angioplasty status: Secondary | ICD-10-CM

## 2016-03-19 DIAGNOSIS — E1140 Type 2 diabetes mellitus with diabetic neuropathy, unspecified: Secondary | ICD-10-CM | POA: Diagnosis not present

## 2016-03-19 DIAGNOSIS — N183 Chronic kidney disease, stage 3 unspecified: Secondary | ICD-10-CM

## 2016-03-19 MED ORDER — METOPROLOL SUCCINATE ER 50 MG PO TB24: 50.0000 mg | ORAL_TABLET | Freq: Every day | ORAL | Status: DC

## 2016-03-19 NOTE — Assessment & Plan Note (Signed)
CFX DES Jan 2017-cath showed patent site June 2017

## 2016-03-19 NOTE — Progress Notes (Signed)
Cardiology Office Note    Date:  03/19/2016   ID:  Adrian Singh, DOB 03/04/1934, MRN [id number]  PCP:  Viviana Simpler, MD  Cardiologist:  Dr Martinique   Chief Complaint  Patient presents with  . Hospitalization Follow-up    post cath    History of Present Illness:  Adrian Singh is a 82 y.o. male with a history of CAD, s/p CFX DES Jan 2017. He also has DM, CRI, neuropathy, prior CVA, HTN, and PAF. He was recently admitted with Lt arm pain, DOE, and dizziness. He was concerned he had progression of his CAD. Cath done 02/29/16 patent stent and no other significant CAD. He is in the office today for follow up. He is frustrated. He says he still has dizziness. He describes orthostatic dizziness, no syncope.     Past Medical History  Diagnosis Date  . Hypertrophy of prostate without urinary obstruction and other lower urinary tract symptoms (LUTS)   . Esophageal reflux   . Unspecified essential hypertension   . Atrial fibrillation ([place])     x1  . CAD (coronary artery disease)     nonobstructive  . Thrombocytopenia ([place])   . Complication of anesthesia     "03/2015 didn't get sick but lost my appetite for a couple months" (10/06/2015)  . Type II diabetes mellitus ([place])   . Stroke [facility]) 03/2014    "had a series of mini strokes; maybe 4"; denies residual on 10/06/2015  . Osteoarthrosis, unspecified whether generalized or localized, unspecified site   . Arthritis     "legs, back" (10/06/2015)  . Chronic kidney disease, stage III (moderate)   . Kidney stones   . Cancer ([place])     skin cancer on ear (froze it off) and back (cut it off)    Past Surgical History  Procedure Laterality Date  . Esophagogastroduodenoscopy (egd) with esophageal dilation  2001    with dilation  . Back surgery    . Shoulder arthroscopy w/ rotator cuff repair Left 2005  . Cardiac catheterization  12/2007  . Colonoscopy w/ biopsies and polypectomy    . Ear cyst excision N/A 04/06/2015    Procedure: EXCISION OF  SCALP CYST;  Surgeon: Donnie Mesa, MD;  Location: [place];  Service: General;  Laterality: N/A;  . Cardiac catheterization N/A 09/22/2015    Procedure: Left Heart Cath and Coronary Angiography;  Surgeon: Peter M Martinique, MD;  Location: [place] [facility];  Service: Cardiovascular;  Laterality: N/A;  . Cardiac catheterization N/A 10/06/2015    Procedure: Coronary Stent Intervention;  Surgeon: Peter M Martinique, MD;  Location: [place] [facility];  Service: Cardiovascular;  Laterality: N/A;  . Lumbar disc surgery  09/26/1999    "cleaned out arthritis and bone spurs"  . Coronary stent placement  10/06/2015    [name]  with DES  . Cardiac catheterization N/A 02/29/2016    Procedure: Left Heart Cath and Coronary Angiography;  Surgeon: Jettie Booze, MD;  Location: [place] [facility];  Service: Cardiovascular;  Laterality: N/A;    Current Medications: Outpatient Prescriptions Prior to Visit  Medication Sig Dispense Refill  . acetaminophen (TYLENOL) 325 MG tablet Take 2 tablets (650 mg total) by mouth every 4 (four) hours as needed for headache or mild pain.    Marland Kitchen atorvastatin (LIPITOR) 40 MG tablet Take 1 tablet (40 mg total) by mouth daily. 30 tablet 5  . B Complex Vitamins (VITAMIN B COMPLEX) TABS Take 1 tablet by mouth 3 (  three) times a week.     Marland Kitchen BAYER MICROLET LANCETS lancets Use to test blood sugar once daily dx: E11.40 100 each 1  . Blood Glucose Monitoring Suppl (CONTOUR NEXT EZ MONITOR) w/Device KIT Use to test blood sugar once daily dx: E11.40 1 kit 0  . Cholecalciferol (VITAMIN D) 2000 UNITS CAPS Take 2,000 Units by mouth daily.     . clopidogrel (PLAVIX) 75 MG tablet Take 1 tablet (75 mg total) by mouth daily with breakfast. 30 tablet 5  . finasteride (PROSCAR) 5 MG tablet TAKE 1 TABLET (5 MG TOTAL) BY MOUTH DAILY. 90 tablet 3  . glimepiride (AMARYL) 4 MG tablet TAKE ONE TABLET BY MOUTH WITH BREAKFAST OR FIRST MAIN MEAL OF THE DAY 90 tablet 1  . glucose blood (BAYER CONTOUR NEXT TEST) test  strip Use to test blood sugar once daily dx: E11.40 100 each 1  . metFORMIN (GLUCOPHAGE-XR) 500 MG 24 hr tablet Take 1 tablet (500 mg total) by mouth 2 (two) times daily. 360 tablet 3  . Multiple Vitamin (MULTIVITAMIN WITH MINERALS) TABS tablet Take 1 tablet by mouth daily.    . nitroGLYCERIN (NITROSTAT) 0.4 MG SL tablet Place 1 tablet (0.4 mg total) under the tongue every 5 (five) minutes as needed for chest pain. 25 tablet 3  . pantoprazole (PROTONIX) 40 MG tablet Take 40 mg by mouth daily.    . ramipril (ALTACE) 2.5 MG capsule TAKE 1 CAPSULE (2.5 MG TOTAL) BY MOUTH DAILY.  3  . rivaroxaban (XARELTO) 20 MG TABS tablet Take 1 tablet (20 mg total) by mouth daily with supper. 30 tablet 1  . traMADol (ULTRAM) 50 MG tablet TAKE ONE TABLET BY MOUTH THREE TIMES DAILY AS NEEDED 30 tablet 0  . metoprolol succinate (TOPROL-XL) 100 MG 24 hr tablet Take 1 tablet (100 mg total) by mouth daily. Take with or immediately following a meal. 90 tablet 3  . tamsulosin (FLOMAX) 0.4 MG CAPS capsule Take 1 capsule (0.4 mg total) by mouth daily. 90 capsule 3  . triamterene-hydrochlorothiazide (MAXZIDE-25) 37.5-25 MG tablet Take 1 tablet by mouth daily. 30 tablet 11  . ibuprofen (ADVIL,MOTRIN) 800 MG tablet Take 1 tablet (800 mg total) by mouth 2 (two) times daily. Take for 1 week. 14 tablet 0   No facility-administered medications prior to visit.     Allergies:   Doxazosin   Social History   Social History  . Marital Status: Married    Spouse Name: N/A  . Number of Children: 4  . Years of Education: N/A   Occupational History  . 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 Smoking status: Former Smoker -- 3 years    Types: Cigarettes  . Smokeless tobacco: Never Used     Comment: " Quit smoking by age 27; was a someday smoker "  . Alcohol Use: No  . Drug Use: No  . Sexual Activity: No   Other Topics Concern  . Not on file   Social History Narrative   No living will   Plans wife  and then children to make health care decisions for him if unable   Would request at least attempts at resuscitation but no prolonged life support   Doesn't think he would want tube feeds if cognitively unaware     Family History:  The patient's family history includes COPD in his brother; Cancer in his brother; Coronary artery disease in his mother; Heart attack (age of onset: 19)  in his mother; Heart disease in his brother; Heart failure in his mother; Lung cancer in his sister; Peripheral vascular disease in his father; Stroke in his father.   ROS:   Please see the history of present illness.    ROS All other systems reviewed and are negative.   PHYSICAL EXAM:   VS:  BP 123/75 mmHg  Pulse 88  Ht _0  (1.778 m)  Wt 209 lb (94.802 kg)  BMI 29.99 kg/m2   132/72 laying flat, 110/60 sitting, 80/60 standing with dizziness GEN: Well nourished, well developed, in no acute distress HEENT: normal Neck: no JVD, carotid bruits, or masses Cardiac: RRR; no murmurs, rubs, or gallops,no edema  Respiratory:  clear to auscultation bilaterally, normal work of breathing GI: soft, nontender, nondistended, + BS MS: no deformity or atrophy Skin: warm and dry, no rash Neuro:  Alert and Oriented x 3, Strength and sensation are intact Psych: euthymic mood, full affect  Wt Readings from Last 3 Encounters:  03/19/16 209 lb (94.802 kg)  02/29/16 208 lb 15.9 oz (94.8 kg)  01/16/16 217 lb (98.431 kg)      Studies/Labs Reviewed:    Recent Labs: 03/28/2015: ALT 26 02/29/2016: BUN 25*; Creatinine, Ser 1.46*; Hemoglobin 11.0*; Platelets 98*; Potassium 4.4; Sodium 136   Lipid Panel    Component Value Date/Time   CHOL 135 03/28/2015 1133   TRIG 337.0* 03/28/2015 1133   HDL 30.70* 03/28/2015 1133   CHOLHDL 4 03/28/2015 1133   VLDL 67.4* 03/28/2015 1133   LDLCALC 47 04/01/2014 0556   LDLDIRECT 77.0 03/28/2015 1133     ASSESSMENT:    1. Orthostatic hypotension   2. CAD S/P percutaneous  coronary angioplasty   3. PAF (paroxysmal atrial fibrillation) ([place])   4. Type 2 diabetes mellitus, uncontrolled, with neuropathy ([place])   5. History of CVA (cerebrovascular accident)   6. Chronic anticoagulation   7. Chronic kidney disease, stage III (moderate)      PLAN:  In order of problems listed above:  1. I made several medication adjustments today as he is significantly orthostatic and symptomatic. I decreased his Toprolol to 50 mg daily, stopped his Flomax, stopped his Maxzide, and had him take his Altace Q HS. Ill see him later this week for follow up.     Medication Adjustments/Labs and Tests Ordered: Current medicines are reviewed at length with the patient today.  Concerns regarding medicines are outlined above.  Medication changes, Labs and Tests ordered today are listed in the Patient Instructions below. Patient Instructions  Kerin Ransom, PA-C, has recommended making the following medication changes: 1. DECREASE Metoprolol to 50 mg daily 2. TAKE Altace at night starting tomorrow 3. STOP Tamsulosin 4. STOP Maxide (Triamterene-Hydrochlorothiazide  Your physician recommends that you schedule a follow-up appointment in next week with Kerin Ransom, PA-C.  If you need a refill on your cardiac medications before your next appointment, please call your pharmacy.    Angelena Form, PA-C  03/19/2016 11:20 AM    [facility] [place], [place], [place]  [address] Phone: [phone]; Fax: [phone]

## 2016-03-19 NOTE — Assessment & Plan Note (Signed)
Holding NSR 

## 2016-03-19 NOTE — Patient Instructions (Signed)
Kerin Ransom, [place], has recommended making the following medication changes: 1. DECREASE Metoprolol to 50 mg daily 2. TAKE Altace at night starting tomorrow 3. STOP Tamsulosin 4. STOP Maxide (Triamterene-Hydrochlorothiazide  Your physician recommends that you schedule a follow-up appointment in next week with Kerin Ransom, PA-C.  If you need a refill on your cardiac medications before your next appointment, please call your pharmacy.

## 2016-03-19 NOTE — Assessment & Plan Note (Signed)
Xarelto

## 2016-03-19 NOTE — Assessment & Plan Note (Signed)
Component of autonomic neuropathy

## 2016-03-19 NOTE — Assessment & Plan Note (Signed)
Documented by me- 132/72 laying flat, 110/60 sitting, 80/60 standing with dizziness.

## 2016-03-19 NOTE — Assessment & Plan Note (Signed)
CRI with DM

## 2016-03-19 NOTE — Assessment & Plan Note (Signed)
July 2015 multiple right frontal and parietal as well as right temporal infarct. Infarcts felt to be embolic secondary to atrial fibrillation.

## 2016-03-22 ENCOUNTER — Ambulatory Visit (INDEPENDENT_AMBULATORY_CARE_PROVIDER_SITE_OTHER): Payer: Medicare Other | Admitting: Cardiology

## 2016-03-22 ENCOUNTER — Encounter: Payer: Self-pay | Admitting: Cardiology

## 2016-03-22 VITALS — BP 124/66 | HR 80 | Ht 70.0 in | Wt 211.0 lb

## 2016-03-22 DIAGNOSIS — I951 Orthostatic hypotension: Secondary | ICD-10-CM

## 2016-03-22 NOTE — Assessment & Plan Note (Signed)
Seen today in follow from medication changes made 03/19/16. He feels much better.

## 2016-03-22 NOTE — Patient Instructions (Addendum)
Your physician recommends that you schedule a follow-up appointment in: Keep appointment with Dr Martinique on 08/04 @10 :50

## 2016-03-22 NOTE — Progress Notes (Signed)
03/22/2016 Adrian Singh   11/12/1933  [id number]  Primary Physician Viviana Simpler, MD Primary Cardiologist: Dr Martinique  HPI:  82 y.o. male with a history of CAD, s/p CFX DES Jan 2017. He also has DM, CRI, neuropathy, prior CVA, HTN, and PAF. He was recently admitted with Lt arm pain, DOE, and dizziness. He was concerned he had progression of his CAD. Cath done 02/29/16 patent stent and no other significant CAD. Dr Debara Pickett saw him in the office the day after discharge for continued pain that Dr Debara Pickett felt was non cardiac. I saw him is in the office 03/19/16 for follow up. He described orthostatic dizziness, no syncope. I adjusted his medications and he is here today for follow up. He feels better no further orthostatic dizziness.    Current Outpatient Prescriptions  Medication Sig Dispense Refill  . acetaminophen (TYLENOL) 325 MG tablet Take 2 tablets (650 mg total) by mouth every 4 (four) hours as needed for headache or mild pain.    Marland Kitchen atorvastatin (LIPITOR) 40 MG tablet Take 1 tablet (40 mg total) by mouth daily. 30 tablet 5  . B Complex Vitamins (VITAMIN B COMPLEX) TABS Take 1 tablet by mouth 3 (three) times a week.     Marland Kitchen BAYER MICROLET LANCETS lancets Use to test blood sugar once daily dx: E11.40 100 each 1  . Blood Glucose Monitoring Suppl (CONTOUR NEXT EZ MONITOR) w/Device KIT Use to test blood sugar once daily dx: E11.40 1 kit 0  . Cholecalciferol (VITAMIN D) 2000 UNITS CAPS Take 2,000 Units by mouth daily.     . clopidogrel (PLAVIX) 75 MG tablet Take 1 tablet (75 mg total) by mouth daily with breakfast. 30 tablet 5  . finasteride (PROSCAR) 5 MG tablet TAKE 1 TABLET (5 MG TOTAL) BY MOUTH DAILY. 90 tablet 3  . glimepiride (AMARYL) 4 MG tablet TAKE ONE TABLET BY MOUTH WITH BREAKFAST OR FIRST MAIN MEAL OF THE DAY 90 tablet 1  . glucose blood (BAYER CONTOUR NEXT TEST) test strip Use to test blood sugar once daily dx: E11.40 100 each 1  . metFORMIN (GLUCOPHAGE-XR) 500 MG 24 hr tablet Take 1  tablet (500 mg total) by mouth 2 (two) times daily. 360 tablet 3  . metoprolol succinate (TOPROL-XL) 50 MG 24 hr tablet Take 1 tablet (50 mg total) by mouth daily. Take with or immediately following a meal. 90 tablet 3  . Multiple Vitamin (MULTIVITAMIN WITH MINERALS) TABS tablet Take 1 tablet by mouth daily.    . nitroGLYCERIN (NITROSTAT) 0.4 MG SL tablet Place 1 tablet (0.4 mg total) under the tongue every 5 (five) minutes as needed for chest pain. 25 tablet 3  . pantoprazole (PROTONIX) 40 MG tablet Take 40 mg by mouth daily.    . ramipril (ALTACE) 2.5 MG capsule TAKE 1 CAPSULE (2.5 MG TOTAL) BY MOUTH DAILY.  3  . rivaroxaban (XARELTO) 20 MG TABS tablet Take 1 tablet (20 mg total) by mouth daily with supper. 30 tablet 1  . traMADol (ULTRAM) 50 MG tablet TAKE ONE TABLET BY MOUTH THREE TIMES DAILY AS NEEDED 30 tablet 0   No current facility-administered medications for this visit.    Allergies  Allergen Reactions  . Doxazosin Other (See Comments)    dizziness    Social History   Social History  . Marital Status: Married    Spouse Name: N/A  . Number of Children: 4  . Years of Education: N/A   Occupational History  . Machine  repair for [employer]    Social History Main Topics  . Smoking status: Former Smoker -- 3 years    Types: Cigarettes  . Smokeless tobacco: Never Used     Comment: " Quit smoking by age 85; was a someday smoker "  . Alcohol Use: No  . Drug Use: No  . Sexual Activity: No   Other Topics Concern  . Not on file   Social History Narrative   No living will   Plans wife and then children to make health care decisions for him if unable   Would request at least attempts at resuscitation but no prolonged life support   Doesn't think he would want tube feeds if cognitively unaware     Review of Systems: General: negative for chills, fever, night sweats or weight changes.  Cardiovascular: negative for chest pain, dyspnea on exertion, edema, orthopnea,  palpitations, paroxysmal nocturnal dyspnea or shortness of breath Dermatological: negative for rash Respiratory: negative for cough or wheezing Urologic: negative for hematuria Abdominal: negative for nausea, vomiting, diarrhea, bright red blood per rectum, melena, or hematemesis Neurologic: negative for visual changes, syncope, or dizziness All other systems reviewed and are otherwise negative except as noted above.    Blood pressure 124/66, pulse 80, height '5\' 10"'  (1.778 m), weight 211 lb (95.709 kg).  General appearance: alert, cooperative and no distress Lungs: clear to auscultation bilaterally Heart: regular rate and rhythm  ASSESSMENT AND PLAN:   Orthostatic hypotension Seen today in follow from medication changes made 03/19/16. He feels much better.  Other problems listed 7/3/176  PLAN  Same Rx- keep f/u with Dr Martinique. OK to use 1/2 Maxzide prn edema.   Kerin Ransom PA-C 03/22/2016 11:53 AM

## 2016-03-26 ENCOUNTER — Telehealth: Payer: Self-pay | Admitting: Cardiology

## 2016-03-26 NOTE — Telephone Encounter (Signed)
Left message that no samples are available and he should try to call later in the week.

## 2016-03-26 NOTE — Telephone Encounter (Signed)
New message ° ° ° ° ° °Patient calling the office for samples of medication: ° ° °1.  What medication and dosage are you requesting samples for? xarelto 20mg °2.  Are you currently out of this medication? Almost out ° ° °

## 2016-03-27 NOTE — Addendum Note (Signed)
Addended by: Cristopher Estimable on: 03/27/2016 08:55 AM   Modules accepted: Orders

## 2016-04-02 ENCOUNTER — Encounter: Payer: Self-pay | Admitting: Internal Medicine

## 2016-04-02 ENCOUNTER — Ambulatory Visit (INDEPENDENT_AMBULATORY_CARE_PROVIDER_SITE_OTHER): Payer: Medicare Other | Admitting: Internal Medicine

## 2016-04-02 VITALS — BP 104/58 | HR 72 | Temp 97.9°F | Wt 210.0 lb

## 2016-04-02 DIAGNOSIS — I25119 Atherosclerotic heart disease of native coronary artery with unspecified angina pectoris: Secondary | ICD-10-CM | POA: Diagnosis not present

## 2016-04-02 DIAGNOSIS — E1140 Type 2 diabetes mellitus with diabetic neuropathy, unspecified: Secondary | ICD-10-CM

## 2016-04-02 DIAGNOSIS — I951 Orthostatic hypotension: Secondary | ICD-10-CM | POA: Diagnosis not present

## 2016-04-02 DIAGNOSIS — N400 Enlarged prostate without lower urinary tract symptoms: Secondary | ICD-10-CM

## 2016-04-02 DIAGNOSIS — IMO0002 Reserved for concepts with insufficient information to code with codable children: Secondary | ICD-10-CM

## 2016-04-02 DIAGNOSIS — E1165 Type 2 diabetes mellitus with hyperglycemia: Secondary | ICD-10-CM

## 2016-04-02 LAB — COMPREHENSIVE METABOLIC PANEL
ALT: 30 U/L (ref 0–53)
AST: 27 U/L (ref 0–37)
Albumin: 4 g/dL (ref 3.5–5.2)
Alkaline Phosphatase: 60 U/L (ref 39–117)
BUN: 24 mg/dL — ABNORMAL HIGH (ref 6–23)
CO2: 29 mEq/L (ref 19–32)
Calcium: 9.2 mg/dL (ref 8.4–10.5)
Chloride: 102 mEq/L (ref 96–112)
Creatinine, Ser: 1.52 mg/dL — ABNORMAL HIGH (ref 0.40–1.50)
GFR: 47.17 mL/min — ABNORMAL LOW (ref >60.00)
Glucose, Bld: 161 mg/dL — ABNORMAL HIGH (ref 70–99)
Potassium: 4.6 mEq/L (ref 3.5–5.1)
Sodium: 138 mEq/L (ref 135–145)
Total Bilirubin: 0.7 mg/dL (ref 0.2–1.2)
Total Protein: 7 g/dL (ref 6.0–8.3)

## 2016-04-02 LAB — HEMOGLOBIN A1C: Hgb A1c MFr Bld: 8 % — ABNORMAL HIGH (ref 4.6–6.5)

## 2016-04-02 LAB — HM DIABETES FOOT EXAM

## 2016-04-02 LAB — LIPID PANEL
Cholesterol: 73 mg/dL (ref 0–200)
HDL: 29.3 mg/dL — ABNORMAL LOW (ref >39.00)
LDL Cholesterol: 18 mg/dL (ref 0–99)
NonHDL: 43.31
Total CHOL/HDL Ratio: 2
Triglycerides: 125 mg/dL (ref 0.0–149.0)
VLDL: 25 mg/dL (ref 0.0–40.0)

## 2016-04-02 MED ORDER — METFORMIN HCL 500 MG PO TABS
500.0000 mg | ORAL_TABLET | Freq: Three times a day (TID) | ORAL | Status: DC
Start: 2016-04-02 — End: 2016-04-20

## 2016-04-02 MED ORDER — METFORMIN HCL ER 500 MG PO TB24: 500.0000 mg | ORAL_TABLET | Freq: Three times a day (TID) | ORAL | Status: DC

## 2016-04-02 NOTE — Assessment & Plan Note (Signed)
Now just on finasteride Can retry tamsulosin if increased symptoms

## 2016-04-02 NOTE — Patient Instructions (Signed)
If you have more trouble urinating, you can try tamsulosin (flomax)--- maybe every other day. Let me know if you have trouble with the metformin--you need to use the not sustained release since you have to crush them.

## 2016-04-02 NOTE — Progress Notes (Signed)
Pre visit review using our clinic review tool, if applicable. No additional management support is needed unless otherwise documented below in the visit note. 

## 2016-04-02 NOTE — Progress Notes (Signed)
Subjective:    Patient ID: LANTZ HERMANN, male    DOB: [birth year], 82 y.o.   MRN: [id number]  HPI Here with wife about his diabetes Tried taking 4 of the metformin a day--but couldn't tolerate Loose bowels and he thinks the pill is coming out intact Doing okay taking 3 a day (1 tid)  Sugars 145-150 generally Rare low sugar under 90---felt trembling and weak  Still better with the dizziness Off maxzide due to this--- but ankles started swelling Now taking 1/2 tab daily  Off the flomax for 2 weeks of something Still on finasteride Feels his flow is slower--but doing okay   Current Outpatient Prescriptions on File Prior to Visit  Medication Sig Dispense Refill  . acetaminophen (TYLENOL) 325 MG tablet Take 2 tablets (650 mg total) by mouth every 4 (four) hours as needed for headache or mild pain.    Marland Kitchen atorvastatin (LIPITOR) 40 MG tablet Take 1 tablet (40 mg total) by mouth daily. 30 tablet 5  . B Complex Vitamins (VITAMIN B COMPLEX) TABS Take 1 tablet by mouth 3 (three) times a week.     Marland Kitchen BAYER MICROLET LANCETS lancets Use to test blood sugar once daily dx: E11.40 100 each 1  . Blood Glucose Monitoring Suppl (CONTOUR NEXT EZ MONITOR) w/Device KIT Use to test blood sugar once daily dx: E11.40 1 kit 0  . Cholecalciferol (VITAMIN D) 2000 UNITS CAPS Take 2,000 Units by mouth daily.     . clopidogrel (PLAVIX) 75 MG tablet Take 1 tablet (75 mg total) by mouth daily with breakfast. 30 tablet 5  . finasteride (PROSCAR) 5 MG tablet TAKE 1 TABLET (5 MG TOTAL) BY MOUTH DAILY. 90 tablet 3  . glimepiride (AMARYL) 4 MG tablet TAKE ONE TABLET BY MOUTH WITH BREAKFAST OR FIRST MAIN MEAL OF THE DAY 90 tablet 1  . glucose blood (BAYER CONTOUR NEXT TEST) test strip Use to test blood sugar once daily dx: E11.40 100 each 1  . metoprolol succinate (TOPROL-XL) 50 MG 24 hr tablet Take 1 tablet (50 mg total) by mouth daily. Take with or immediately following a meal. 90 tablet 3  . Multiple Vitamin  (MULTIVITAMIN WITH MINERALS) TABS tablet Take 1 tablet by mouth daily.    . nitroGLYCERIN (NITROSTAT) 0.4 MG SL tablet Place 1 tablet (0.4 mg total) under the tongue every 5 (five) minutes as needed for chest pain. 25 tablet 3  . pantoprazole (PROTONIX) 40 MG tablet Take 40 mg by mouth daily.    . ramipril (ALTACE) 2.5 MG capsule TAKE 1 CAPSULE (2.5 MG TOTAL) BY MOUTH DAILY.  3  . rivaroxaban (XARELTO) 20 MG TABS tablet Take 1 tablet (20 mg total) by mouth daily with supper. 30 tablet 1  . traMADol (ULTRAM) 50 MG tablet TAKE ONE TABLET BY MOUTH THREE TIMES DAILY AS NEEDED 30 tablet 0   No current facility-administered medications on file prior to visit.    Allergies  Allergen Reactions  . Doxazosin Other (See Comments)    dizziness    Past Medical History  Diagnosis Date  . Hypertrophy of prostate without urinary obstruction and other lower urinary tract symptoms (LUTS)   . Esophageal reflux   . Unspecified essential hypertension   . Atrial fibrillation ()     x1  . CAD (coronary artery disease)     nonobstructive  . Thrombocytopenia ([place])   . Complication of anesthesia     "03/2015 didn't get sick but lost my appetite for a couple  months" (10/06/2015)  . Type II diabetes mellitus ([place])   . Stroke [facility]) 03/2014    "had a series of mini strokes; maybe 4"; denies residual on 10/06/2015  . Osteoarthrosis, unspecified whether generalized or localized, unspecified site   . Arthritis     "legs, back" (10/06/2015)  . Chronic kidney disease, stage III (moderate)   . Kidney stones   . Cancer ([place])     skin cancer on ear (froze it off) and back (cut it off)    Past Surgical History  Procedure Laterality Date  . Esophagogastroduodenoscopy (egd) with esophageal dilation  2001    with dilation  . Back surgery    . Shoulder arthroscopy w/ rotator cuff repair Left 2005  . Cardiac catheterization  12/2007  . Colonoscopy w/ biopsies and polypectomy    . Ear cyst excision N/A 04/06/2015     Procedure: EXCISION OF SCALP CYST;  Surgeon: Donnie Mesa, MD;  Location: [place];  Service: General;  Laterality: N/A;  . Cardiac catheterization N/A 09/22/2015    Procedure: Left Heart Cath and Coronary Angiography;  Surgeon: Peter M Martinique, MD;  Location: [place] [facility];  Service: Cardiovascular;  Laterality: N/A;  . Cardiac catheterization N/A 10/06/2015    Procedure: Coronary Stent Intervention;  Surgeon: Peter M Martinique, MD;  Location: [place] [facility];  Service: Cardiovascular;  Laterality: N/A;  . Lumbar disc surgery  09/26/1999    "cleaned out arthritis and bone spurs"  . Coronary stent placement  10/06/2015    [name]  with DES  . Cardiac catheterization N/A 02/29/2016    Procedure: Left Heart Cath and Coronary Angiography;  Surgeon: Jettie Booze, MD;  Location: [place] [facility];  Service: Cardiovascular;  Laterality: N/A;    Family History  Problem Relation Age of Onset  . Stroke Father   . Peripheral vascular disease Father     amputation  . Heart failure Mother     CHF  . Coronary artery disease Mother   . Heart attack Mother 39    Multiple  . COPD Brother   . Heart disease Brother   . Cancer Brother     Bone  . Lung cancer Sister     Social History   Social History  . Marital Status: Married    Spouse Name: N/A  . Number of Children: 4  . Years of Education: N/A   Occupational History  . 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 Smoking status: Former Smoker -- 3 years    Types: Cigarettes  . Smokeless tobacco: Never Used     Comment: " Quit smoking by age 89; was a someday smoker "  . Alcohol Use: No  . Drug Use: No  . Sexual Activity: No   Other Topics Concern  . Not on file   Social History Narrative   No living will   Plans wife and then children to make health care decisions for him if unable   Would request at least attempts at resuscitation but no prolonged life support   Doesn't think he would want tube feeds if  cognitively unaware   Review of Systems Has lost some weight--around 10#. Relates to the loose bowels No foot sores Numbness persists in feet---not really painful (just mostly tingling)    Objective:   Physical Exam  Constitutional: He appears well-developed and well-nourished. No distress.  Neck: Normal range of motion. Neck supple.  Cardiovascular: Normal rate,  regular rhythm, normal heart sounds and intact distal pulses.  Exam reveals no gallop.   No murmur heard. Pulmonary/Chest: Effort normal and breath sounds normal. No respiratory distress. He has no wheezes. He has no rales.  Abdominal: Soft. There is no tenderness.  Musculoskeletal: He exhibits no edema or tenderness.  Lymphadenopathy:    He has no cervical adenopathy.  Neurological:  Decreased sensation in feet  Skin:  Mycotic toenails Thick callous mid right plantar foot No ulcers  Psychiatric: He has a normal mood and affect.          Assessment & Plan:

## 2016-04-02 NOTE — Assessment & Plan Note (Signed)
BP Readings from Last 3 Encounters:  04/02/16 104/58  03/22/16 124/66  03/19/16 123/75   Better with med changes Did have to restart diuretic as 1/2 daily

## 2016-04-02 NOTE — Assessment & Plan Note (Signed)
No recent chest pain Recent cath reassuring Will check lipids

## 2016-04-02 NOTE — Assessment & Plan Note (Signed)
Hopefully better now AM sugars lower Crushing the ER metformin--will switch to regular (and hope he can still tolerate the tid) Consider increasing the glimiperide

## 2016-04-09 ENCOUNTER — Other Ambulatory Visit: Payer: Self-pay | Admitting: Cardiology

## 2016-04-10 ENCOUNTER — Other Ambulatory Visit: Payer: Self-pay | Admitting: Internal Medicine

## 2016-04-11 NOTE — Telephone Encounter (Signed)
Left refill on voice mail at pharmacy  

## 2016-04-11 NOTE — Telephone Encounter (Signed)
Approved: 30 x 0 

## 2016-04-13 ENCOUNTER — Other Ambulatory Visit: Payer: Self-pay | Admitting: Internal Medicine

## 2016-04-16 ENCOUNTER — Other Ambulatory Visit: Payer: Self-pay | Admitting: Cardiology

## 2016-04-16 NOTE — Telephone Encounter (Signed)
Rx(s) sent to pharmacy electronically.  

## 2016-04-19 NOTE — Progress Notes (Signed)
Adrian Singh Date of Birth: 26-Aug-1934   History of Present Illness: Adrian Singh is seen today for follow up CAD.  He has a history of nonobstructive coronary disease by cardiac catheterization 2009. He also has a history of orthostatic dizziness. He was admitted in July 3546 with an embolic stroke presumably due to paroxysmal Afib. He was anticoagulated with Eliquis. The patient developed tachycardia and HA on Eliquis that resolved when he was switched to Xarelto. Previous orthostatic dizziness improved with stopping diuretic and alpha blockers he was taking for enlarged prostate.  He was seen in the office on 08/10/2015 with shortness of breath with exertion. He had outpatient stress test done on 08/25/2015 which showed EF mildly decreased 45-54%, no significant ST elevation or depression during stress portion, overall considered low risk study, however has prior myocardial infarction with peri-infarct ischemia. The peri-infarct ischemia was felt to be new compared to the previous stress test, therefore outpatient cardiac catheterization was arranged. He underwent planned study on 09/22/2015 which showed 30% mid LAD lesion, 99% mid to distal left circumflex lesion, 50% OM 3 lesion. Due to poor support from right radial approach and concern for renal function, it was planned for staged PCI at a later time. He was admitted to [facility] on 10/06/15 for staged PCI/DES to mid LCx lesion that wa 99% stenosed. There was residual 30% mid LAD stenosis to be treated medically.During that hospitalization his Maxide was stopped and he was started on amlodipine.  He was admitted in June 2017 with chest pain. No new disease and prior stent was patent. Later when seen as an outpatient he developed orthostatic dizziness. maxzide was stopped and metoprolol was reduced with improvement. On follow up today he remains on Plavix and Xarelto. He notes no edema and has lost some weight. He denies any increased SOB or chest pain. He  still has some intermittent shooting pains in left arm  Current Outpatient Prescriptions on File Prior to Visit  Medication Sig Dispense Refill  . acetaminophen (TYLENOL) 325 MG tablet Take 2 tablets (650 mg total) by mouth every 4 (four) hours as needed for headache or mild pain.    Marland Kitchen atorvastatin (LIPITOR) 40 MG tablet TAKE ONE TABLET BY MOUTH ONCE DAILY 30 tablet 0  . B Complex Vitamins (VITAMIN B COMPLEX) TABS Take 1 tablet by mouth 3 (three) times a week.     Marland Kitchen BAYER MICROLET LANCETS lancets Use to test blood sugar once daily dx: E11.40 100 each 1  . Blood Glucose Monitoring Suppl (CONTOUR NEXT EZ MONITOR) w/Device KIT Use to test blood sugar once daily dx: E11.40 1 kit 0  . Cholecalciferol (VITAMIN D) 2000 UNITS CAPS Take 2,000 Units by mouth daily.     . clopidogrel (PLAVIX) 75 MG tablet TAKE ONE TABLET BY MOUTH ONCE DAILY WITH BREAKFAST 30 tablet 11  . finasteride (PROSCAR) 5 MG tablet TAKE 1 TABLET (5 MG TOTAL) BY MOUTH DAILY. 90 tablet 3  . glimepiride (AMARYL) 4 MG tablet TAKE ONE TABLET BY MOUTH WITH BREAKFAST OR FIRST MAIN MEAL OF THE DAY 90 tablet 1  . glucose blood (BAYER CONTOUR NEXT TEST) test strip Use to test blood sugar once daily dx: E11.40 100 each 1  . metFORMIN (GLUCOPHAGE) 500 MG tablet Take 1 tablet (500 mg total) by mouth 3 (three) times daily. 270 tablet 3  . metoprolol succinate (TOPROL-XL) 50 MG 24 hr tablet Take 1 tablet (50 mg total) by mouth daily. Take with or immediately following a  meal. 90 tablet 3  . Multiple Vitamin (MULTIVITAMIN WITH MINERALS) TABS tablet Take 1 tablet by mouth daily.    . nitroGLYCERIN (NITROSTAT) 0.4 MG SL tablet Place 1 tablet (0.4 mg total) under the tongue every 5 (five) minutes as needed for chest pain. 25 tablet 3  . pantoprazole (PROTONIX) 40 MG tablet TAKE ONE TABLET BY MOUTH ONCE DAILY 90 tablet 3  . ramipril (ALTACE) 2.5 MG capsule TAKE 1 CAPSULE (2.5 MG TOTAL) BY MOUTH DAILY.  3  . rivaroxaban (XARELTO) 20 MG TABS tablet Take 1  tablet (20 mg total) by mouth daily with supper. 30 tablet 1  . traMADol (ULTRAM) 50 MG tablet TAKE ONE TABLET BY MOUTH THREE TIMES DAILY AS NEEDED 30 tablet 0  . triamterene-hydrochlorothiazide (MAXZIDE-25) 37.5-25 MG tablet Take 0.5 tablets by mouth daily.     No current facility-administered medications on file prior to visit.     Allergies  Allergen Reactions  . Doxazosin Other (See Comments)    dizziness    Past Medical History:  Diagnosis Date  . Arthritis    "legs, back" (10/06/2015)  . Atrial fibrillation ([place])    x1  . CAD (coronary artery disease)    nonobstructive  . Cancer ([place])    skin cancer on ear (froze it off) and back (cut it off)  . Chronic kidney disease, stage III (moderate)   . Complication of anesthesia    "03/2015 didn't get sick but lost my appetite for a couple months" (10/06/2015)  . Esophageal reflux   . Hypertrophy of prostate without urinary obstruction and other lower urinary tract symptoms (LUTS)   . Kidney stones   . Osteoarthrosis, unspecified whether generalized or localized, unspecified site   . Stroke [facility]) 03/2014   "had a series of mini strokes; maybe 4"; denies residual on 10/06/2015  . Thrombocytopenia ([place])   . Type II diabetes mellitus ([place])   . Unspecified essential hypertension     Past Surgical History:  Procedure Laterality Date  . BACK SURGERY    . CARDIAC CATHETERIZATION  12/2007  . CARDIAC CATHETERIZATION N/A 09/22/2015   Procedure: Left Heart Cath and Coronary Angiography;  Surgeon: Mariaisabel Bodiford M Martinique, MD;  Location: [place] [facility];  Service: Cardiovascular;  Laterality: N/A;  . CARDIAC CATHETERIZATION N/A 10/06/2015   Procedure: Coronary Stent Intervention;  Surgeon: Maziah Keeling M Martinique, MD;  Location: [place] [facility];  Service: Cardiovascular;  Laterality: N/A;  . CARDIAC CATHETERIZATION N/A 02/29/2016   Procedure: Left Heart Cath and Coronary Angiography;  Surgeon: Jettie Booze, MD;  Location: [place] [facility];  Service:  Cardiovascular;  Laterality: N/A;  . COLONOSCOPY W/ BIOPSIES AND POLYPECTOMY    . CORONARY STENT PLACEMENT  10/06/2015   [name]  with DES  . EAR CYST EXCISION N/A 04/06/2015   Procedure: EXCISION OF SCALP CYST;  Surgeon: Donnie Mesa, MD;  Location: [place];  Service: General;  Laterality: N/A;  . ESOPHAGOGASTRODUODENOSCOPY (EGD) WITH ESOPHAGEAL DILATION  2001   with dilation  . LUMBAR DISC SURGERY  09/26/1999   "cleaned out arthritis and bone spurs"  . SHOULDER ARTHROSCOPY W/ ROTATOR CUFF REPAIR Left 2005    History  Smoking Status  . Former Smoker  . Years: 3.00  . Types: Cigarettes  Smokeless Tobacco  . Never Used    Comment: " Quit smoking by age 68; was a someday smoker "    History  Alcohol Use No    Family History  Problem Relation Age of  Onset  . Stroke Father   . Peripheral vascular disease Father     amputation  . Heart failure Mother     CHF  . Coronary artery disease Mother   . Heart attack Mother 64    Multiple  . COPD Brother   . Heart disease Brother   . Cancer Brother     Bone  . Lung cancer Sister     Review of Systems: The review of systems is as noted in HPI.  All other systems were reviewed and are negative.  Physical Exam: There were no vitals taken for this visit. He is a pleasant white male in no acute distress.  The HEENT exam is normal.  The carotids are 2+ without bruits.  There is no thyromegaly.  There is no JVD.  The lungs are clear.    The heart exam reveals a regular rate with a normal S1 and S2.  There are no murmurs, gallops, or rubs.  The PMI is not displaced.   Abdominal exam reveals good bowel sounds.    There is no hepatosplenomegaly or tenderness.  There are no masses.  Exam of the legs reveal no edema. The distal pulses are intact.  Cranial nerves II - XII are intact.  Motor and sensory functions are intact.  The gait is normal.  LABORATORY DATA: Lab Results  Component Value Date   WBC 5.4 02/29/2016   HGB 11.0 (L) 02/29/2016    HCT 34.4 (L) 02/29/2016   PLT 98 (L) 02/29/2016   GLUCOSE 161 (H) 04/02/2016   CHOL 73 04/02/2016   TRIG 125.0 04/02/2016   HDL 29.30 (L) 04/02/2016   LDLDIRECT 77.0 03/28/2015   LDLCALC 18 04/02/2016   ALT 30 04/02/2016   AST 27 04/02/2016   NA 138 04/02/2016   K 4.6 04/02/2016   CL 102 04/02/2016   CREATININE 1.52 (H) 04/02/2016   BUN 24 (H) 04/02/2016   CO2 29 04/02/2016   TSH 3.33 08/26/2013   INR 1.45 02/29/2016   HGBA1C 8.0 (H) 04/02/2016   MICROALBUR 17.2 (H) 05/23/2012      Assessment / Plan: 1. CAD s/p DES of mid LCx in January 2017. No recurrent angina. Repeat cardiac cath in June 2017 showed continued patency. Will continue Plavix for one year. No ASA since he is on Xarelto.   2. Hypertension   3. CKD stage 3.   4. Paroxysmal Afib. Continue metoprolol and Xarelto.  5. History of CVA.  6. DM per  Dr Silvio Pate. Glycemic control has improved.  I will follow up in 6 months.

## 2016-04-20 ENCOUNTER — Encounter (INDEPENDENT_AMBULATORY_CARE_PROVIDER_SITE_OTHER): Payer: Self-pay

## 2016-04-20 ENCOUNTER — Ambulatory Visit (INDEPENDENT_AMBULATORY_CARE_PROVIDER_SITE_OTHER): Payer: Medicare Other | Admitting: Cardiology

## 2016-04-20 ENCOUNTER — Encounter: Payer: Self-pay | Admitting: Cardiology

## 2016-04-20 VITALS — BP 126/68 | HR 93 | Ht 70.0 in | Wt 208.0 lb

## 2016-04-20 DIAGNOSIS — I480 Paroxysmal atrial fibrillation: Secondary | ICD-10-CM

## 2016-04-20 DIAGNOSIS — I951 Orthostatic hypotension: Secondary | ICD-10-CM | POA: Diagnosis not present

## 2016-04-20 DIAGNOSIS — I2510 Atherosclerotic heart disease of native coronary artery without angina pectoris: Secondary | ICD-10-CM | POA: Diagnosis not present

## 2016-04-20 DIAGNOSIS — Z9861 Coronary angioplasty status: Secondary | ICD-10-CM

## 2016-04-20 DIAGNOSIS — I10 Essential (primary) hypertension: Secondary | ICD-10-CM | POA: Diagnosis not present

## 2016-04-20 NOTE — Patient Instructions (Signed)
Continue your current therapy  I will see you in 6 months.   

## 2016-04-30 DIAGNOSIS — E1140 Type 2 diabetes mellitus with diabetic neuropathy, unspecified: Secondary | ICD-10-CM | POA: Diagnosis not present

## 2016-05-08 ENCOUNTER — Other Ambulatory Visit: Payer: Self-pay | Admitting: Cardiology

## 2016-05-08 NOTE — Telephone Encounter (Signed)
Rx(s) sent to pharmacy electronically.  

## 2016-05-24 ENCOUNTER — Other Ambulatory Visit: Payer: Self-pay | Admitting: Internal Medicine

## 2016-05-24 NOTE — Telephone Encounter (Signed)
Approved: 30 x 0 

## 2016-05-24 NOTE — Telephone Encounter (Signed)
Tramadol last filled 04-11-16 #30 Last OV 04-02-16 Next OV 10-01-16

## 2016-05-24 NOTE — Telephone Encounter (Signed)
Tramadol Left refill on voice mail at pharmacy Finasteride Rx sent electronically.

## 2016-05-28 ENCOUNTER — Ambulatory Visit (INDEPENDENT_AMBULATORY_CARE_PROVIDER_SITE_OTHER): Payer: Medicare Other | Admitting: *Deleted

## 2016-05-28 DIAGNOSIS — Z23 Encounter for immunization: Secondary | ICD-10-CM

## 2016-06-04 ENCOUNTER — Telehealth: Payer: Self-pay | Admitting: Cardiology

## 2016-06-04 NOTE — Telephone Encounter (Signed)
Patient calling the office for samples of medication: ° ° °1.  What medication and dosage are you requesting samples for? Xarelto 20 mg ° °2.  Are you currently out of this medication? yes ° ° °

## 2016-06-05 ENCOUNTER — Ambulatory Visit (INDEPENDENT_AMBULATORY_CARE_PROVIDER_SITE_OTHER): Payer: Medicare Other | Admitting: Family Medicine

## 2016-06-05 ENCOUNTER — Encounter: Payer: Self-pay | Admitting: Family Medicine

## 2016-06-05 VITALS — BP 104/64 | HR 80 | Temp 97.4°F | Wt 202.0 lb

## 2016-06-05 DIAGNOSIS — R197 Diarrhea, unspecified: Secondary | ICD-10-CM

## 2016-06-05 LAB — CBC WITH DIFFERENTIAL/PLATELET
Basophils Absolute: 0 10*3/uL (ref 0.0–0.1)
Basophils Relative: 0.5 % (ref 0.0–3.0)
Eosinophils Absolute: 0.1 10*3/uL (ref 0.0–0.7)
Eosinophils Relative: 1 % (ref 0.0–5.0)
HCT: 36.1 % — ABNORMAL LOW (ref 39.0–52.0)
Hemoglobin: 12.4 g/dL — ABNORMAL LOW (ref 13.0–17.0)
Lymphocytes Relative: 23.1 % (ref 12.0–46.0)
Lymphs Abs: 1.5 10*3/uL (ref 0.7–4.0)
MCHC: 34.2 g/dL (ref 30.0–36.0)
MCV: 85.1 fl (ref 78.0–100.0)
Monocytes Absolute: 0.8 10*3/uL (ref 0.1–1.0)
Monocytes Relative: 12.5 % — ABNORMAL HIGH (ref 3.0–12.0)
Neutro Abs: 4.1 10*3/uL (ref 1.4–7.7)
Neutrophils Relative %: 62.9 % (ref 43.0–77.0)
Platelets: 147 10*3/uL — ABNORMAL LOW (ref 150.0–400.0)
RBC: 4.25 Mil/uL (ref 4.22–5.81)
RDW: 13.6 % (ref 11.5–15.5)
WBC: 6.4 10*3/uL (ref 4.0–10.5)

## 2016-06-05 LAB — COMPREHENSIVE METABOLIC PANEL
ALT: 36 U/L (ref 0–53)
AST: 32 U/L (ref 0–37)
Albumin: 3.9 g/dL (ref 3.5–5.2)
Alkaline Phosphatase: 80 U/L (ref 39–117)
BUN: 33 mg/dL — ABNORMAL HIGH (ref 6–23)
CO2: 27 mEq/L (ref 19–32)
Calcium: 9.2 mg/dL (ref 8.4–10.5)
Chloride: 106 mEq/L (ref 96–112)
Creatinine, Ser: 1.58 mg/dL — ABNORMAL HIGH (ref 0.40–1.50)
GFR: 45.09 mL/min — ABNORMAL LOW (ref >60.00)
Glucose, Bld: 166 mg/dL — ABNORMAL HIGH (ref 70–99)
Potassium: 5.2 mEq/L — ABNORMAL HIGH (ref 3.5–5.1)
Sodium: 140 mEq/L (ref 135–145)
Total Bilirubin: 0.7 mg/dL (ref 0.2–1.2)
Total Protein: 6.9 g/dL (ref 6.0–8.3)

## 2016-06-05 LAB — LIPASE: Lipase: 157 U/L — ABNORMAL HIGH (ref 11.0–59.0)

## 2016-06-05 MED ORDER — METFORMIN HCL 500 MG PO TABS
500.0000 mg | ORAL_TABLET | Freq: Every day | ORAL | Status: DC
Start: 2016-06-05 — End: 2016-11-02

## 2016-06-05 NOTE — Patient Instructions (Signed)
Stay off the protonix for now.  Cut metformin back to one pill a day.  Update [place] in a few days.  Go to the lab on the way out.  We'll contact you with your lab report. Take care.  Glad to see you.

## 2016-06-05 NOTE — Progress Notes (Signed)
Pre visit review using our clinic review tool, if applicable. No additional management support is needed unless otherwise documented below in the visit note. 

## 2016-06-05 NOTE — Progress Notes (Signed)
On metformin.  Loose stools.  He cut back on protonix, stopped in the last few days but w/o change in sx.   Was changed back to regular release metformin and did okay for awhile but then has had loose stools in the meantime.  Gradually worse in the last few weeks.  No blood in stool.  No black stools.  No fevers, no chills, but some nausea after meals.  Doesn't wake up with nausea.  No cough.  No one else is sick.  No travel.  No exotic foods.  Not drinking well water.  Some occ upper abd soreness but not frank pain.   Sugar has usually been in the 110s in the AM.    Meds, vitals, and allergies reviewed.   ROS: Per HPI unless specifically indicated in ROS section   GEN: nad, alert and oriented HEENT: mucous membranes moist NECK: supple w/o LA CV: rrr.  PULM: ctab, no inc wob ABD: soft, +bs, not ttp in the upper abd EXT: no edema Not jaundiced.

## 2016-06-05 NOTE — Assessment & Plan Note (Signed)
With some upper abd discomfort.  Nontoxic.  Check labs today, stay off protonix since already off.  Cut metformin back to 1 pill a day.  Likely metformin is the issue.  D/w pt.  Okay for outpatient f/u.  Routed to PCP as FYI.

## 2016-06-12 ENCOUNTER — Other Ambulatory Visit: Payer: Self-pay | Admitting: Internal Medicine

## 2016-06-13 ENCOUNTER — Other Ambulatory Visit: Payer: Self-pay | Admitting: Internal Medicine

## 2016-06-13 MED ORDER — GLUCOSE BLOOD VI STRP: ORAL_STRIP | 3 refills | Status: DC

## 2016-06-13 MED ORDER — BAYER CONTOUR NEXT EZ W/DEVICE KIT: PACK | 0 refills | Status: AC

## 2016-06-13 NOTE — Addendum Note (Signed)
Addended by: Pilar Grammes on: 06/13/2016 09:55 AM   Modules accepted: Orders

## 2016-06-13 NOTE — Telephone Encounter (Signed)
Refill sent electronically, per MD instructions.

## 2016-06-13 NOTE — Telephone Encounter (Signed)
MD please review attached test strip request by pharmacy for patient.

## 2016-06-13 NOTE — Addendum Note (Signed)
Addended by: Magdalen Spatz C on: 06/13/2016 04:16 PM   Modules accepted: Orders

## 2016-06-13 NOTE — Telephone Encounter (Signed)
Pharmacy called to request re-submission of Test strip refill order due to absence of dx code on last R/X.  R/X resent to pharmacy with dx code E11.40 per Md Instructions from initial request.

## 2016-06-13 NOTE — Telephone Encounter (Signed)
Approved: #100 x 3

## 2016-06-14 DIAGNOSIS — E1140 Type 2 diabetes mellitus with diabetic neuropathy, unspecified: Secondary | ICD-10-CM | POA: Diagnosis not present

## 2016-06-20 DIAGNOSIS — H2513 Age-related nuclear cataract, bilateral: Secondary | ICD-10-CM | POA: Diagnosis not present

## 2016-06-20 LAB — HM DIABETES EYE EXAM

## 2016-07-03 ENCOUNTER — Encounter: Payer: Self-pay | Admitting: Internal Medicine

## 2016-07-10 ENCOUNTER — Telehealth: Payer: Self-pay | Admitting: Cardiology

## 2016-07-10 MED ORDER — RIVAROXABAN 20 MG PO TABS: 20.0000 mg | ORAL_TABLET | Freq: Every day | ORAL | 6 refills | Status: DC

## 2016-07-10 NOTE — Telephone Encounter (Signed)
Returned call to patient Xarelto 20 mg samples left at [name] office front desk. 

## 2016-07-10 NOTE — Telephone Encounter (Signed)
Patient calling the office for samples of medication: ° ° °1.  What medication and dosage are you requesting samples for? Xarelto 20mg ° °2.  Are you currently out of this medication? 1 pill left ° ° °

## 2016-07-23 ENCOUNTER — Telehealth: Payer: Self-pay | Admitting: Cardiology

## 2016-07-23 ENCOUNTER — Other Ambulatory Visit: Payer: Self-pay | Admitting: Internal Medicine

## 2016-07-23 NOTE — Telephone Encounter (Signed)
Medication Samples have been provided to the patient.  Drug name: XareltoQty: 21LOT: [id number].Date: 2/20  The patient has been instructed regarding the correct time, dose, and frequency of taking this medication, including desired effects and most common side effects.

## 2016-07-23 NOTE — Telephone Encounter (Signed)
Last filled 05-25-16 #30 Last OV 04-02-16 Next OV 10-01-16

## 2016-07-23 NOTE — Telephone Encounter (Signed)
Left refill on voice mail at pharmacy  

## 2016-07-23 NOTE — Telephone Encounter (Signed)
Approved: 30 x 0 

## 2016-07-23 NOTE — Telephone Encounter (Signed)
New message ° ° ° ° ° °Patient calling the office for samples of medication: ° ° °1.  What medication and dosage are you requesting samples for? xarelto 20mg °2.  Are you currently out of this medication? Almost out ° ° °

## 2016-08-13 ENCOUNTER — Telehealth: Payer: Self-pay | Admitting: Cardiology

## 2016-08-13 NOTE — Telephone Encounter (Signed)
New message ° ° °Patient calling the office for samples of medication: ° ° °1.  What medication and dosage are you requesting samples for?rivaroxaban (XARELTO) 20 MG TABS tablet ° °2.  Are you currently out of this medication? Yes  ° ° °

## 2016-08-13 NOTE — Telephone Encounter (Signed)
Returned call to patient no answer.Left message on personal voice mail Xarelto 20 mg samples left at front desk of [name] office.

## 2016-08-28 ENCOUNTER — Telehealth: Payer: Self-pay | Admitting: Cardiology

## 2016-08-28 NOTE — Telephone Encounter (Signed)
Samples at the front desk Pt notified 

## 2016-08-28 NOTE — Telephone Encounter (Signed)
New message ° ° ° ° ° °Patient calling the office for samples of medication: ° ° °1.  What medication and dosage are you requesting samples for?  xarelto 20mg ° °2.  Are you currently out of this medication? Almost out of medication ° ° °

## 2016-09-11 ENCOUNTER — Other Ambulatory Visit: Payer: Self-pay | Admitting: Internal Medicine

## 2016-09-18 ENCOUNTER — Other Ambulatory Visit: Payer: Self-pay | Admitting: Internal Medicine

## 2016-09-18 DIAGNOSIS — E1140 Type 2 diabetes mellitus with diabetic neuropathy, unspecified: Secondary | ICD-10-CM | POA: Diagnosis not present

## 2016-09-27 ENCOUNTER — Telehealth: Payer: Self-pay | Admitting: Cardiology

## 2016-09-27 NOTE — Telephone Encounter (Signed)
Returned call to patient, patient aware we are out of samples at this time.  Patient reports he has enough to last until Monday.  Advised his pharmacy should have refills for him, pt aware and verbalized he will get it refilled and pick it up.  Advised to call with further questions/concerns.

## 2016-09-27 NOTE — Telephone Encounter (Signed)
Patient calling the office for samples of medication:   1.  What medication and dosage are you requesting samples for? Xarelto, 20 mg  2.  Are you currently out of this medication? No, couple of pills left

## 2016-10-01 ENCOUNTER — Encounter: Payer: Medicare Other | Admitting: Internal Medicine

## 2016-10-19 ENCOUNTER — Telehealth: Payer: Self-pay | Admitting: Cardiology

## 2016-10-19 NOTE — Telephone Encounter (Signed)
New Message      Do you have an samples for the Xarelto 20 mgs? Please have Malachy Mood call him

## 2016-10-19 NOTE — Telephone Encounter (Signed)
Samples provided at pt request, placed at front desk.

## 2016-10-23 ENCOUNTER — Other Ambulatory Visit: Payer: Self-pay | Admitting: Internal Medicine

## 2016-10-26 ENCOUNTER — Telehealth: Payer: Self-pay

## 2016-10-26 NOTE — Telephone Encounter (Signed)
Pt came into the office requesting an explanation to why his Rx for Flomax was denied---I explained to pt that the Proscar was on current medication list---I reviewed OV note from 03/2016 and 09/2015--- I do not see note stating pt should take both Proscar and Flomax---saw note explaining that pt stopped Flomax due to the cost and had been reporting good Sx control on Proscar--- I explained to pt that I did not see where that I could see he was supposed to be taking both and it would be best to only take one medication unless otherwise instructed upon your return--- pt complain of increase in urination.... Pt stated he had been taking both pills 1 in the AM and one in the PM.... Explained to pt again that it would be best to only take one medication until return call from [place]... Pt did not want to agree to the instructions as he is sure that he has to take both pills for relief--- I advised one more time to hold off until Dr Alla German return to address medication instructions and or changes--he expressed understanding--- please advise

## 2016-10-27 NOTE — Telephone Encounter (Signed)
I have no problem with his restarting the tamsulosin--but he should realize that he should have let us know first, since he told me he had stopped it. Okay to refill for a year

## 2016-10-29 MED ORDER — TAMSULOSIN HCL 0.4 MG PO CAPS: 0.4000 mg | ORAL_CAPSULE | Freq: Every day | ORAL | 3 refills | Status: DC

## 2016-10-29 NOTE — Telephone Encounter (Signed)
Left detailed message per DPR that it was okay to take the tamsulosin. I have sent in the refill.

## 2016-11-02 ENCOUNTER — Encounter: Payer: Self-pay | Admitting: Internal Medicine

## 2016-11-02 ENCOUNTER — Ambulatory Visit (INDEPENDENT_AMBULATORY_CARE_PROVIDER_SITE_OTHER): Payer: Medicare Other | Admitting: Internal Medicine

## 2016-11-02 VITALS — BP 110/64 | HR 86 | Temp 98.1°F | Ht 68.75 in | Wt 208.0 lb

## 2016-11-02 DIAGNOSIS — Z7189 Other specified counseling: Secondary | ICD-10-CM | POA: Diagnosis not present

## 2016-11-02 DIAGNOSIS — Z23 Encounter for immunization: Secondary | ICD-10-CM | POA: Diagnosis not present

## 2016-11-02 DIAGNOSIS — E1165 Type 2 diabetes mellitus with hyperglycemia: Secondary | ICD-10-CM

## 2016-11-02 DIAGNOSIS — E1122 Type 2 diabetes mellitus with diabetic chronic kidney disease: Secondary | ICD-10-CM

## 2016-11-02 DIAGNOSIS — D696 Thrombocytopenia, unspecified: Secondary | ICD-10-CM | POA: Diagnosis not present

## 2016-11-02 DIAGNOSIS — IMO0002 Reserved for concepts with insufficient information to code with codable children: Secondary | ICD-10-CM

## 2016-11-02 DIAGNOSIS — I480 Paroxysmal atrial fibrillation: Secondary | ICD-10-CM

## 2016-11-02 DIAGNOSIS — I25119 Atherosclerotic heart disease of native coronary artery with unspecified angina pectoris: Secondary | ICD-10-CM | POA: Diagnosis not present

## 2016-11-02 DIAGNOSIS — Z0000 Encounter for general adult medical examination without abnormal findings: Secondary | ICD-10-CM

## 2016-11-02 DIAGNOSIS — E1140 Type 2 diabetes mellitus with diabetic neuropathy, unspecified: Secondary | ICD-10-CM

## 2016-11-02 DIAGNOSIS — N183 Chronic kidney disease, stage 3 unspecified: Secondary | ICD-10-CM

## 2016-11-02 LAB — RENAL FUNCTION PANEL
Albumin: 4.1 g/dL (ref 3.5–5.2)
BUN: 25 mg/dL — ABNORMAL HIGH (ref 6–23)
CO2: 26 mEq/L (ref 19–32)
Calcium: 9.4 mg/dL (ref 8.4–10.5)
Chloride: 104 mEq/L (ref 96–112)
Creatinine, Ser: 1.49 mg/dL (ref 0.40–1.50)
GFR: 48.2 mL/min — ABNORMAL LOW (ref >60.00)
Glucose, Bld: 199 mg/dL — ABNORMAL HIGH (ref 70–99)
Phosphorus: 2.6 mg/dL (ref 2.3–4.6)
Potassium: 4.5 mEq/L (ref 3.5–5.1)
Sodium: 140 mEq/L (ref 135–145)

## 2016-11-02 LAB — HEMOGLOBIN A1C: Hgb A1c MFr Bld: 8.9 % — ABNORMAL HIGH (ref 4.6–6.5)

## 2016-11-02 MED ORDER — METFORMIN HCL 500 MG PO TABS: 500.0000 mg | ORAL_TABLET | Freq: Three times a day (TID) | ORAL | 3 refills | Status: DC

## 2016-11-02 MED ORDER — TRIAMTERENE-HCTZ 37.5-25 MG PO TABS: 0.5000 | ORAL_TABLET | Freq: Every day | ORAL | 3 refills | Status: DC

## 2016-11-02 NOTE — Assessment & Plan Note (Signed)
See social history Prefers not to do it formally

## 2016-11-02 NOTE — Assessment & Plan Note (Signed)
Has stable angina pattern (DOE on steps) No action needed

## 2016-11-02 NOTE — Assessment & Plan Note (Signed)
I have personally reviewed the Medicare Annual Wellness questionnaire and have noted 1. The patient's medical and social history 2. Their use of alcohol, tobacco or illicit drugs 3. Their current medications and supplements 4. The patient's functional ability including ADL's, fall risks, home safety risks and hearing or visual             impairment. 5. Diet and physical activities 6. Evidence for depression or mood disorders  The patients weight, height, BMI and visual acuity have been recorded in the chart I have made referrals, counseling and provided education to the patient based review of the above and I have provided the pt with a written personalized care plan for preventive services.  I have provided you with a copy of your personalized plan for preventive services. Please take the time to review along with your updated medication list.  Will update pneumovax No cancer screening due to age Discussed trying to increase exercise

## 2016-11-02 NOTE — Assessment & Plan Note (Signed)
Hopefully still acceptable control Goal is under 8% Consider increase glipizide

## 2016-11-02 NOTE — Assessment & Plan Note (Signed)
On ramipril Will recheck labs

## 2016-11-02 NOTE — Assessment & Plan Note (Signed)
Still regular Continues on xarelto

## 2016-11-02 NOTE — Progress Notes (Signed)
Subjective:    Patient ID: Adrian Singh, male    DOB: [birth year], 83 y.o.   MRN: [id number]  HPI Here for Medicare wellness and follow up of chronic health conditions Reviewed form and advanced directives Reviewed other doctors No alcohol or tobacco Vision is good Hearing is poor. Left ear amplifier No falls No depression or anhedonia Independent with instrumental ADLs No really exercising--discussed Mild memory issues--nothing worrisome Wife does the bills (always has)  Recent "bug"--congestion, stuffy nose and felt terrible Didn't seem to be the flu Better now  Restarted flomax for the BPH Really didn't do well without it Now voiding better with the dual therapy Still has 4-5 times nocturia a night. Not so bad in day  Diabetes seems to be okay Checks most mornings Usually 130-180. Mostly 150-160 No hypoglycemic spells Tolerating the metformin Nerve pain seems better with the 1/2 fluid pill (edema is better) Toenails are thick--not able to trim  Heart has been doing okay Hasn't had palpitations lately Still lightheaded upon standing--but not as bad No recent chest pain--still carries the NTG but hasn't needed SOB only if he bends over and gets dizzy. Okay if he just sits Does have chronic stable DOE going up stairs  Current Outpatient Prescriptions on File Prior to Visit  Medication Sig Dispense Refill  . atorvastatin (LIPITOR) 40 MG tablet TAKE ONE TABLET BY MOUTH ONCE DAILY 30 tablet 11  . BAYER MICROLET LANCETS lancets Use to test blood sugar once daily dx: E11.40 100 each 1  . Blood Glucose Monitoring Suppl (CONTOUR NEXT EZ MONITOR) w/Device KIT USE TO TEST BLOOD SUGAR ONCE DAILY. Dx Code E11.40 1 kit 0  . clopidogrel (PLAVIX) 75 MG tablet TAKE ONE TABLET BY MOUTH ONCE DAILY WITH BREAKFAST 30 tablet 11  . finasteride (PROSCAR) 5 MG tablet TAKE ONE TABLET BY MOUTH ONCE DAILY 90 tablet 3  . glimepiride (AMARYL) 4 MG tablet TAKE ONE TABLET BY MOUTH ONCE DAILY  WITH BREAKFAST OR FIRST MAIN MEAL OF THE DAY 90 tablet 0  . glucose blood (BAYER CONTOUR NEXT TEST) test strip USE ONE STRIP TO CHECK GLUCOSE ONCE DAILY.  Use dx code E11.40. 100 each 3  . metFORMIN (GLUCOPHAGE) 500 MG tablet Take 1 tablet (500 mg total) by mouth daily with breakfast. (Patient taking differently: Take 500 mg by mouth 3 (three) times daily. )    . metoprolol succinate (TOPROL-XL) 50 MG 24 hr tablet Take 1 tablet (50 mg total) by mouth daily. Take with or immediately following a meal. 90 tablet 3  . Multiple Vitamin (MULTIVITAMIN WITH MINERALS) TABS tablet Take 1 tablet by mouth daily.    . nitroGLYCERIN (NITROSTAT) 0.4 MG SL tablet Place 1 tablet (0.4 mg total) under the tongue every 5 (five) minutes as needed for chest pain. 25 tablet 3  . ramipril (ALTACE) 2.5 MG capsule TAKE ONE CAPSULE BY MOUTH ONCE DAILY 90 capsule 0  . rivaroxaban (XARELTO) 20 MG TABS tablet Take 1 tablet (20 mg total) by mouth daily with supper. 30 tablet 6  . tamsulosin (FLOMAX) 0.4 MG CAPS capsule Take 1 capsule (0.4 mg total) by mouth daily. 90 capsule 3  . traMADol (ULTRAM) 50 MG tablet TAKE ONE TABLET BY MOUTH THREE TIMES DAILY AS NEEDED 30 tablet 0  . triamterene-hydrochlorothiazide (MAXZIDE-25) 37.5-25 MG tablet Take 0.5 tablets by mouth daily.     No current facility-administered medications on file prior to visit.     Allergies  Allergen Reactions  . Doxazosin Other (  See Comments)    dizziness    Past Medical History:  Diagnosis Date  . Arthritis    "legs, back" (10/06/2015)  . Atrial fibrillation ([place])    x1  . CAD (coronary artery disease)    nonobstructive  . Cancer ([place])    skin cancer on ear (froze it off) and back (cut it off)  . Chronic kidney disease, stage III (moderate)   . Complication of anesthesia    "03/2015 didn't get sick but lost my appetite for a couple months" (10/06/2015)  . Esophageal reflux   . Hypertrophy of prostate without urinary obstruction and other lower  urinary tract symptoms (LUTS)   . Kidney stones   . Osteoarthrosis, unspecified whether generalized or localized, unspecified site   . Stroke [facility]) 03/2014   "had a series of mini strokes; maybe 4"; denies residual on 10/06/2015  . Thrombocytopenia ([place])   . Type II diabetes mellitus ([place])   . Unspecified essential hypertension     Past Surgical History:  Procedure Laterality Date  . BACK SURGERY    . CARDIAC CATHETERIZATION  12/2007  . CARDIAC CATHETERIZATION N/A 09/22/2015   Procedure: Left Heart Cath and Coronary Angiography;  Surgeon: Peter M Martinique, MD;  Location: [place] [facility];  Service: Cardiovascular;  Laterality: N/A;  . CARDIAC CATHETERIZATION N/A 10/06/2015   Procedure: Coronary Stent Intervention;  Surgeon: Peter M Martinique, MD;  Location: [place] [facility];  Service: Cardiovascular;  Laterality: N/A;  . CARDIAC CATHETERIZATION N/A 02/29/2016   Procedure: Left Heart Cath and Coronary Angiography;  Surgeon: Jettie Booze, MD;  Location: [place] [facility];  Service: Cardiovascular;  Laterality: N/A;  . COLONOSCOPY W/ BIOPSIES AND POLYPECTOMY    . CORONARY STENT PLACEMENT  10/06/2015   [name]  with DES  . EAR CYST EXCISION N/A 04/06/2015   Procedure: EXCISION OF SCALP CYST;  Surgeon: Donnie Mesa, MD;  Location: [place];  Service: General;  Laterality: N/A;  . ESOPHAGOGASTRODUODENOSCOPY (EGD) WITH ESOPHAGEAL DILATION  2001   with dilation  . LUMBAR DISC SURGERY  09/26/1999   "cleaned out arthritis and bone spurs"  . SHOULDER ARTHROSCOPY W/ ROTATOR CUFF REPAIR Left 2005    Family History  Problem Relation Age of Onset  . Stroke Father   . Peripheral vascular disease Father     amputation  . Heart failure Mother     CHF  . Coronary artery disease Mother   . Heart attack Mother 3    Multiple  . COPD Brother   . Heart disease Brother   . Cancer Brother     Bone  . Lung cancer Sister     Social History   Social History  . Marital status: Married    Spouse name:  N/A  . Number of children: 4  . Years of education: N/A   Occupational History  . 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 Smoking status: Former Smoker    Years: 3.00    Types: Cigarettes  . Smokeless tobacco: Never Used     Comment: " Quit smoking by age 35; was a someday smoker "  . Alcohol use No  . Drug use: No  . Sexual activity: No   Other Topics Concern  . Not on file   Social History Narrative   No living will   Plans wife and then children to make health care decisions for him if unable   Would request at  least attempts at resuscitation but no prolonged life support   Doesn't think he would want tube feeds if cognitively unaware   Review of Systems Appetite is okay Weight is stable Sleeps fairly well Wears seat belt Teeth are okay--full dentures No heartburn or dysphagia Some back pain with prolonged standing. No major joint pains No skin rash or suspicious lesions Bowels are fine. Loose at times with the metformin. No blood Does bruise easily--but nothing severe    Objective:   Physical Exam  Constitutional: He is oriented to person, place, and time. He appears well-nourished. No distress.  HENT:  Mouth/Throat: Oropharynx is clear and moist. No oropharyngeal exudate.  Neck: Normal range of motion. Neck supple. No thyromegaly present.  Cardiovascular: Normal rate, regular rhythm, normal heart sounds and intact distal pulses.  Exam reveals no gallop.   No murmur heard. Pulmonary/Chest: Effort normal and breath sounds normal. No respiratory distress. He has no wheezes. He has no rales.  Abdominal: Soft. There is no tenderness.  Musculoskeletal: He exhibits no edema or tenderness.  Lymphadenopathy:    He has no cervical adenopathy.  Neurological: He is alert and oriented to person, place, and time.  President-- "[place] Score, [place]" [phone] D-l-r-[id number]-w Recall 3/3  Decreased sensation in feet is about  the same  Skin: No rash noted. No erythema.  No foot lesions Mycotic toenails  Psychiatric: He has a normal mood and affect. His behavior is normal.          Assessment & Plan:

## 2016-11-02 NOTE — Progress Notes (Signed)
Pre visit review using our clinic review tool, if applicable. No additional management support is needed unless otherwise documented below in the visit note. 

## 2016-11-02 NOTE — Addendum Note (Signed)
Addended by: Pilar Grammes on: 11/02/2016 12:13 PM   Modules accepted: Orders

## 2016-11-02 NOTE — Assessment & Plan Note (Signed)
Bruises easy but not a big deal

## 2016-11-06 ENCOUNTER — Telehealth: Payer: Self-pay

## 2016-11-06 MED ORDER — GLIMEPIRIDE 4 MG PO TABS: 4.0000 mg | ORAL_TABLET | Freq: Two times a day (BID) | ORAL | 1 refills | Status: DC

## 2016-11-06 NOTE — Telephone Encounter (Signed)
Per lab results, new glipizide rx sent to [facility]

## 2016-11-08 NOTE — Progress Notes (Signed)
Adrian Singh, Adrian Singh   History of Present Illness: Adrian Singh is seen today for follow up CAD.  He has a history of nonobstructive coronary disease by cardiac catheterization 2009. He also has a history of orthostatic dizziness. He was admitted in July 3536 with an embolic stroke presumably due to paroxysmal Afib. He was anticoagulated with Eliquis. The patient developed tachycardia and HA on Eliquis that resolved when he was switched to Xarelto. Previous orthostatic dizziness improved with stopping diuretic and alpha blockers he was taking for enlarged prostate.  He was seen in the office on 08/10/2015 with shortness of breath with exertion. He had outpatient stress test done on 08/25/2015 which showed EF mildly decreased 45-54%, no significant ST elevation or depression during stress portion, overall considered low risk study, however has prior myocardial infarction with peri-infarct ischemia. The peri-infarct ischemia was felt to be new compared to the previous stress test, therefore outpatient cardiac catheterization was arranged. He underwent planned study on 09/22/2015 which showed 30% mid LAD lesion, 99% mid to distal left circumflex lesion, 50% OM 3 lesion. Due to poor support from right radial approach and concern for renal function, it was planned for staged PCI at a later time. He was admitted to [facility] on 10/06/15 for staged PCI/DES to mid LCx lesion that wa 99% stenosed. There was residual 30% mid LAD stenosis to be treated medically.During that hospitalization his Maxide was stopped and he was started on amlodipine.  He was admitted in June 2017 with chest pain. Cardiac cath showed no new disease and prior stent was patent. Later when seen as an outpatient he developed orthostatic dizziness. maxzide was stopped and metoprolol was reduced with improvement. On follow up today he notes no edema although his weight has increased.  He denies any increased SOB or chest pain. He  still has dizziness that he says has been present ever since his CVA in 2015. Worse if he tries to move too quick.   Current Outpatient Prescriptions on File Prior to Visit  Medication Sig Dispense Refill  . atorvastatin (LIPITOR) 40 MG tablet TAKE ONE TABLET BY MOUTH ONCE DAILY 30 tablet 11  . BAYER MICROLET LANCETS lancets Use to test blood sugar once daily dx: E11.40 100 each 1  . Blood Glucose Monitoring Suppl (CONTOUR NEXT EZ MONITOR) w/Device KIT USE TO TEST BLOOD SUGAR ONCE DAILY. Dx Code E11.40 1 kit 0  . finasteride (PROSCAR) 5 MG tablet TAKE ONE TABLET BY MOUTH ONCE DAILY 90 tablet 3  . glimepiride (AMARYL) 4 MG tablet Take 1 tablet (4 mg total) by mouth 2 (two) times daily. 180 tablet 1  . glucose blood (BAYER CONTOUR NEXT TEST) test strip USE ONE STRIP TO CHECK GLUCOSE ONCE DAILY.  Use dx code E11.40. 100 each 3  . metFORMIN (GLUCOPHAGE) 500 MG tablet Take 1 tablet (500 mg total) by mouth 3 (three) times daily. 270 tablet 3  . metoprolol succinate (TOPROL-XL) 50 MG 24 hr tablet Take 1 tablet (50 mg total) by mouth daily. Take with or immediately following a meal. 90 tablet 3  . Multiple Vitamin (MULTIVITAMIN WITH MINERALS) TABS tablet Take 1 tablet by mouth daily.    . Naproxen Sodium (ALEVE) 220 MG CAPS Take by mouth.    . nitroGLYCERIN (NITROSTAT) 0.4 MG SL tablet Place 1 tablet (0.4 mg total) under the tongue every 5 (five) minutes as needed for chest pain. 25 tablet 3  . ramipril (ALTACE) 2.5 MG capsule TAKE ONE  CAPSULE BY MOUTH ONCE DAILY 90 capsule 0  . rivaroxaban (XARELTO) 20 MG TABS tablet Take 1 tablet (20 mg total) by mouth daily with supper. 30 tablet 6  . tamsulosin (FLOMAX) 0.4 MG CAPS capsule Take 1 capsule (0.4 mg total) by mouth daily. 90 capsule 3  . traMADol (ULTRAM) 50 MG tablet TAKE ONE TABLET BY MOUTH THREE TIMES DAILY AS NEEDED 30 tablet 0  . triamterene-hydrochlorothiazide (MAXZIDE-25) 37.5-25 MG tablet Take 0.5 tablets by mouth daily. 45 tablet 3   No current  facility-administered medications on file prior to visit.     Allergies  Allergen Reactions  . Doxazosin Other (See Comments)    dizziness    Past Medical History:  Diagnosis Date  . Arthritis    "legs, back" (10/06/2015)  . Atrial fibrillation ([place])    x1  . CAD (coronary artery disease)    nonobstructive  . Cancer ([place])    skin cancer on ear (froze it off) and back (cut it off)  . Chronic kidney disease, stage III (moderate)   . Complication of anesthesia    "03/2015 didn't get sick but lost my appetite for a couple months" (10/06/2015)  . Esophageal reflux   . Hypertrophy of prostate without urinary obstruction and other lower urinary tract symptoms (LUTS)   . Kidney stones   . Osteoarthrosis, unspecified whether generalized or localized, unspecified site   . Stroke [facility]) 03/2014   "had a series of mini strokes; maybe 4"; denies residual on 10/06/2015  . Thrombocytopenia ([place])   . Type II diabetes mellitus ([place])   . Unspecified essential hypertension     Past Surgical History:  Procedure Laterality Date  . BACK SURGERY    . CARDIAC CATHETERIZATION  12/2007  . CARDIAC CATHETERIZATION N/A 09/22/2015   Procedure: Left Heart Cath and Coronary Angiography;  Surgeon: Christiano Blandon M Martinique, MD;  Location: [place] [facility];  Service: Cardiovascular;  Laterality: N/A;  . CARDIAC CATHETERIZATION N/A 10/06/2015   Procedure: Coronary Stent Intervention;  Surgeon: Vernetta Dizdarevic M Martinique, MD;  Location: [place] [facility];  Service: Cardiovascular;  Laterality: N/A;  . CARDIAC CATHETERIZATION N/A 02/29/2016   Procedure: Left Heart Cath and Coronary Angiography;  Surgeon: Jettie Booze, MD;  Location: [place] [facility];  Service: Cardiovascular;  Laterality: N/A;  . COLONOSCOPY W/ BIOPSIES AND POLYPECTOMY    . CORONARY STENT PLACEMENT  10/06/2015   [name]  with DES  . EAR CYST EXCISION N/A 04/06/2015   Procedure: EXCISION OF SCALP CYST;  Surgeon: Donnie Mesa, MD;  Location: [place];  Service: General;   Laterality: N/A;  . ESOPHAGOGASTRODUODENOSCOPY (EGD) WITH ESOPHAGEAL DILATION  2001   with dilation  . LUMBAR DISC SURGERY  09/26/1999   "cleaned out arthritis and bone spurs"  . SHOULDER ARTHROSCOPY W/ ROTATOR CUFF REPAIR Left 2005    History  Smoking Status  . Former Smoker  . Years: 3.00  . Types: Cigarettes  Smokeless Tobacco  . Never Used    Comment: " Quit smoking by age 83; was a someday smoker "    History  Alcohol Use No    Family History  Problem Relation Age of Onset  . Stroke Father   . Peripheral vascular disease Father     amputation  . Heart failure Mother     CHF  . Coronary artery disease Mother   . Heart attack Mother 31    Multiple  . COPD Brother   . Heart disease Brother   .  Cancer Brother     Bone  . Lung cancer Sister     Review of Systems: The review of systems is as noted in HPI. Recent glycemic control has been poor. Glimiperide dose increased. Reports can't take higher metformin dose due to GI distress. All other systems were reviewed and are negative.  Physical Exam: BP 134/68   Pulse 90   Ht '5\' 8"'  (1.727 m)   Wt 213 lb (96.6 kg)   BMI 32.39 kg/m  He is a pleasant white male in no acute distress.  The HEENT exam is normal.  The carotids are 2+ without bruits.  There is no thyromegaly.  There is no JVD.  The lungs are clear.    The heart exam reveals a regular rate with a normal S1 and S2.  There are no murmurs, gallops, or rubs.  The PMI is not displaced.   Abdominal exam reveals good bowel sounds.    There is no hepatosplenomegaly or tenderness.  There are no masses.  Exam of the legs reveal no edema. The distal pulses are intact.  Cranial nerves II - XII are intact.  Motor and sensory functions are intact.  The gait is normal.  LABORATORY DATA: Lab Results  Component Value Date   WBC 6.4 06/05/2016   HGB 12.4 (L) 06/05/2016   HCT 36.1 (L) 06/05/2016   PLT 147.0 (L) 06/05/2016   GLUCOSE 199 (H) 11/02/2016   CHOL 73 04/02/2016    TRIG 125.0 04/02/2016   HDL 29.30 (L) 04/02/2016   LDLDIRECT 77.0 03/28/2015   LDLCALC 18 04/02/2016   ALT 36 06/05/2016   AST 32 06/05/2016   NA 140 11/02/2016   K 4.5 11/02/2016   CL 104 11/02/2016   CREATININE 1.49 11/02/2016   BUN 25 (H) 11/02/2016   CO2 26 11/02/2016   TSH 3.33 08/26/2013   INR 1.45 02/29/2016   HGBA1C 8.9 (H) 11/02/2016   MICROALBUR 17.2 (H) 05/23/2012      Assessment / Plan: 1. CAD s/p DES of mid LCx in January 2017. No recurrent angina. Repeat cardiac cath in June 2017 showed continued patency. It has been one year since stent placement so we can stop Plavix now. Continue Xarelto.  2. Hypertension controlled.   3. CKD stage 3.   4. Paroxysmal Afib. Continue metoprolol and Xarelto. Xarelto dose reduced due to CKD.   5. History of CVA.  6. DM per  Dr Silvio Pate. Recent increase in oral therapy.  I will follow up in 6 months.

## 2016-11-09 ENCOUNTER — Encounter: Payer: Self-pay | Admitting: Cardiology

## 2016-11-09 ENCOUNTER — Ambulatory Visit (INDEPENDENT_AMBULATORY_CARE_PROVIDER_SITE_OTHER): Payer: Medicare Other | Admitting: Cardiology

## 2016-11-09 VITALS — BP 134/68 | HR 90 | Ht 68.0 in | Wt 213.0 lb

## 2016-11-09 DIAGNOSIS — I2510 Atherosclerotic heart disease of native coronary artery without angina pectoris: Secondary | ICD-10-CM | POA: Diagnosis not present

## 2016-11-09 DIAGNOSIS — I480 Paroxysmal atrial fibrillation: Secondary | ICD-10-CM

## 2016-11-09 DIAGNOSIS — N183 Chronic kidney disease, stage 3 unspecified: Secondary | ICD-10-CM

## 2016-11-09 DIAGNOSIS — E1165 Type 2 diabetes mellitus with hyperglycemia: Secondary | ICD-10-CM

## 2016-11-09 DIAGNOSIS — I10 Essential (primary) hypertension: Secondary | ICD-10-CM | POA: Diagnosis not present

## 2016-11-09 DIAGNOSIS — Z9861 Coronary angioplasty status: Secondary | ICD-10-CM

## 2016-11-09 DIAGNOSIS — Z8673 Personal history of transient ischemic attack (TIA), and cerebral infarction without residual deficits: Secondary | ICD-10-CM | POA: Diagnosis not present

## 2016-11-09 DIAGNOSIS — E1140 Type 2 diabetes mellitus with diabetic neuropathy, unspecified: Secondary | ICD-10-CM

## 2016-11-09 DIAGNOSIS — IMO0002 Reserved for concepts with insufficient information to code with codable children: Secondary | ICD-10-CM

## 2016-11-09 NOTE — Patient Instructions (Signed)
Stop taking Plavix. Continue your other therapy  I will see you in 6 months.    

## 2016-11-14 DIAGNOSIS — E1140 Type 2 diabetes mellitus with diabetic neuropathy, unspecified: Secondary | ICD-10-CM | POA: Diagnosis not present

## 2016-12-03 ENCOUNTER — Encounter: Payer: Medicare Other | Admitting: Internal Medicine

## 2016-12-07 ENCOUNTER — Other Ambulatory Visit: Payer: Self-pay | Admitting: Internal Medicine

## 2016-12-19 ENCOUNTER — Telehealth: Payer: Self-pay | Admitting: Cardiology

## 2016-12-19 ENCOUNTER — Encounter: Payer: Self-pay | Admitting: Cardiology

## 2016-12-19 ENCOUNTER — Ambulatory Visit (INDEPENDENT_AMBULATORY_CARE_PROVIDER_SITE_OTHER): Payer: Medicare Other | Admitting: Cardiology

## 2016-12-19 VITALS — BP 119/67 | HR 83 | Ht 70.0 in | Wt 210.8 lb

## 2016-12-19 DIAGNOSIS — I10 Essential (primary) hypertension: Secondary | ICD-10-CM

## 2016-12-19 DIAGNOSIS — I480 Paroxysmal atrial fibrillation: Secondary | ICD-10-CM

## 2016-12-19 DIAGNOSIS — I2510 Atherosclerotic heart disease of native coronary artery without angina pectoris: Secondary | ICD-10-CM

## 2016-12-19 DIAGNOSIS — Z9861 Coronary angioplasty status: Secondary | ICD-10-CM

## 2016-12-19 DIAGNOSIS — Z7901 Long term (current) use of anticoagulants: Secondary | ICD-10-CM

## 2016-12-19 DIAGNOSIS — I951 Orthostatic hypotension: Secondary | ICD-10-CM | POA: Diagnosis not present

## 2016-12-19 NOTE — Telephone Encounter (Signed)
Pt of Dr. Martinique Last seen Feb 2018  c/o dizziness  Spoke to patient regarding his symptoms. Adrian Singh notes lightheadedness/near syncope sometimes when standing - this is a recurrent issue that he's noted has become worse in the last several weeks. Voiced that he was at an appt w wife yesterday and when they were called back from waiting room, he stood and became extremely lightheaded, required assistance to stay standing - episode was prolonged but could not specify duration. Notes this is the worst episode he's had, and usually the feeling of lightheadedness passes quickly.  He's not noted any recent changes to meds w/e of Plavix which Dr. Martinique discontinued at his last OV  Pt notes he checks CBG at home and this is usually fine (115 this AM) He does not check BP at home.   Pt preferred to be seen in office to discuss w Dr. Martinique. Informed him of availability today and have added for AM appt. Cheryl aware.  Pt also requested samples of Xarelto, aware he can get these at [facility] today.

## 2016-12-19 NOTE — Patient Instructions (Signed)
Stop taking Flomax and triamterene/HCTZ  Continue your other medication  Wear knee high compression hose  We will refer you to urology  I will see you in 3 months.

## 2016-12-19 NOTE — Progress Notes (Signed)
Adrian Singh Date of Birth: 04-04-1934   History of Present Illness: Adrian Singh is seen today for evaluation of near syncope.  He has a history of nonobstructive coronary disease by cardiac catheterization 2009. He also has a history of orthostatic dizziness. He was admitted in July 8676 with an embolic stroke presumably due to paroxysmal Afib. He was anticoagulated with Eliquis. The patient developed tachycardia and HA on Eliquis that resolved when he was switched to Xarelto. Previous orthostatic dizziness improved with stopping diuretic and alpha blockers he was taking for enlarged prostate.  He was seen in the office on 08/10/2015 with shortness of breath with exertion. He had outpatient stress test done on 08/25/2015 which showed EF mildly decreased 45-54%, no significant ST elevation or depression during stress portion, overall considered low risk study, however has prior myocardial infarction with peri-infarct ischemia. The peri-infarct ischemia was felt to be new compared to the previous stress test, therefore outpatient cardiac catheterization was arranged. He underwent planned study on 09/22/2015 which showed 30% mid LAD lesion, 99% mid to distal left circumflex lesion, 50% OM 3 lesion. Due to poor support from right radial approach and concern for renal function, it was planned for staged PCI at a later time. He was admitted to [facility] on 10/06/15 for staged PCI/DES to mid LCx lesion that wa 99% stenosed. There was residual 30% mid LAD stenosis to be treated medically.  He was admitted in June 2017 with chest pain. Cardiac cath showed no new disease and prior stent was patent. Later when seen as an outpatient he developed orthostatic dizziness. maxzide was stopped and metoprolol was reduced with improvement.  On evaluation today he reports marked worsening of orthostatic dizziness. He gets this anytime he stands and often with sitting up. He almost passed out yesterday. He is back on Maxide now  and also taking Flomax for obstructive urinary symptoms. States he cannot function with this dizziness and unable to get out in his yard.    Current Outpatient Prescriptions on File Prior to Visit  Medication Sig Dispense Refill  . atorvastatin (LIPITOR) 40 MG tablet TAKE ONE TABLET BY MOUTH ONCE DAILY 30 tablet 11  . BAYER MICROLET LANCETS lancets Use to test blood sugar once daily dx: E11.40 100 each 1  . Blood Glucose Monitoring Suppl (CONTOUR NEXT EZ MONITOR) w/Device KIT USE TO TEST BLOOD SUGAR ONCE DAILY. Dx Code E11.40 1 kit 0  . finasteride (PROSCAR) 5 MG tablet TAKE ONE TABLET BY MOUTH ONCE DAILY 90 tablet 3  . glimepiride (AMARYL) 4 MG tablet Take 1 tablet (4 mg total) by mouth 2 (two) times daily. 180 tablet 1  . glucose blood (BAYER CONTOUR NEXT TEST) test strip USE ONE STRIP TO CHECK GLUCOSE ONCE DAILY.  Use dx code E11.40. 100 each 3  . metFORMIN (GLUCOPHAGE) 500 MG tablet Take 1 tablet (500 mg total) by mouth 3 (three) times daily. 270 tablet 3  . metoprolol succinate (TOPROL-XL) 50 MG 24 hr tablet Take 1 tablet (50 mg total) by mouth daily. Take with or immediately following a meal. 90 tablet 3  . Multiple Vitamin (MULTIVITAMIN WITH MINERALS) TABS tablet Take 1 tablet by mouth daily.    . Naproxen Sodium (ALEVE) 220 MG CAPS Take by mouth.    . nitroGLYCERIN (NITROSTAT) 0.4 MG SL tablet Place 1 tablet (0.4 mg total) under the tongue every 5 (five) minutes as needed for chest pain. 25 tablet 3  . ramipril (ALTACE) 2.5 MG capsule TAKE ONE  CAPSULE BY MOUTH ONCE DAILY 90 capsule 3  . rivaroxaban (XARELTO) 20 MG TABS tablet Take 1 tablet (20 mg total) by mouth daily with supper. 30 tablet 6  . traMADol (ULTRAM) 50 MG tablet TAKE ONE TABLET BY MOUTH THREE TIMES DAILY AS NEEDED 30 tablet 0   No current facility-administered medications on file prior to visit.     Allergies  Allergen Reactions  . Doxazosin Other (See Comments)    dizziness    Past Medical History:  Diagnosis Date   . Arthritis    "legs, back" (10/06/2015)  . Atrial fibrillation ([place])    x1  . CAD (coronary artery disease)    nonobstructive  . Cancer ([name])    skin cancer on ear (froze it off) and back (cut it off)  . Chronic kidney disease, stage III (moderate)   . Complication of anesthesia    "03/2015 didn't get sick but lost my appetite for a couple months" (10/06/2015)  . Esophageal reflux   . Hypertrophy of prostate without urinary obstruction and other lower urinary tract symptoms (LUTS)   . Kidney stones   . Osteoarthrosis, unspecified whether generalized or localized, unspecified site   . Stroke [facility]) 03/2014   "had a series of mini strokes; maybe 4"; denies residual on 10/06/2015  . Thrombocytopenia ([place])   . Type II diabetes mellitus ([place])   . Unspecified essential hypertension     Past Surgical History:  Procedure Laterality Date  . BACK SURGERY    . CARDIAC CATHETERIZATION  12/2007  . CARDIAC CATHETERIZATION N/A 09/22/2015   Procedure: Left Heart Cath and Coronary Angiography;  Surgeon: Mignonne Afonso M [place]ique, MD;  Location: [place] [facility];  Service: Cardiovascular;  Laterality: N/A;  . CARDIAC CATHETERIZATION N/A 10/06/2015   Procedure: Coronary Stent Intervention;  Surgeon: Reygan Heagle M Martinique, MD;  Location: [place] [facility];  Service: Cardiovascular;  Laterality: N/A;  . CARDIAC CATHETERIZATION N/A 02/29/2016   Procedure: Left Heart Cath and Coronary Angiography;  Surgeon: Jettie Booze, MD;  Location: [place] [facility];  Service: Cardiovascular;  Laterality: N/A;  . COLONOSCOPY W/ BIOPSIES AND POLYPECTOMY    . CORONARY STENT PLACEMENT  10/06/2015   [name]  with DES  . EAR CYST EXCISION N/A 04/06/2015   Procedure: EXCISION OF SCALP CYST;  Surgeon: Donnie Mesa, MD;  Location: [place] [facility];  Service: General;  Laterality: N/A;  . ESOPHAGOGASTRODUODENOSCOPY (EGD) WITH ESOPHAGEAL DILATION  2001   with dilation  . LUMBAR DISC SURGERY  09/26/1999   "cleaned out arthritis and bone spurs"  .  SHOULDER ARTHROSCOPY W/ ROTATOR CUFF REPAIR Left 2005    History  Smoking Status  . Former Smoker  . Years: 3.00  . Types: Cigarettes  Smokeless Tobacco  . Never Used    Comment: " Quit smoking by age 63; was a someday smoker "    History  Alcohol Use No    Family History  Problem Relation Age of Onset  . Stroke Father   . Peripheral vascular disease Father     amputation  . Heart failure Mother     CHF  . Coronary artery disease Mother   . Heart attack Mother 41    Multiple  . COPD Brother   . Heart disease Brother   . Cancer Brother     Bone  . Lung cancer Sister     Review of Systems: The review of systems is as noted in HPI.  All other systems were reviewed and  are negative.  Physical Exam: BP 119/67 (BP Location: Right Arm)   Pulse 83   Ht '5\' 10"'  (1.778 m)   Wt 210 lb 12.8 oz (95.6 kg)   BMI 30.25 kg/m    Orthostatic vitals: Lying BP 163/81, pulse 81 Sitting BP 135/74, pulse 85 Standing initial 107/65, pulse 85 Standing 3 minutes 124/64 pulse 84.   He is a pleasant white male in no acute distress.  The HEENT exam is normal.  The carotids are 2+ without bruits.  There is no thyromegaly.  There is no JVD.  The lungs are clear.    The heart exam reveals a regular rate with a normal S1 and S2.  There are no murmurs, gallops, or rubs.  The PMI is not displaced.   Abdominal exam reveals good bowel sounds.    There is no hepatosplenomegaly or tenderness.  There are no masses.  Exam of the legs reveal no edema. The distal pulses are intact.  Cranial nerves II - XII are intact.  Motor and sensory functions are intact.  The gait is normal.  LABORATORY DATA: Lab Results  Component Value Date   WBC 6.4 06/05/2016   HGB 12.4 (L) 06/05/2016   HCT 36.1 (L) 06/05/2016   PLT 147.0 (L) 06/05/2016   GLUCOSE 199 (H) 11/02/2016   CHOL 73 04/02/2016   TRIG 125.0 04/02/2016   HDL 29.30 (L) 04/02/2016   LDLDIRECT 77.0 03/28/2015   LDLCALC 18 04/02/2016   ALT 36  06/05/2016   AST 32 06/05/2016   NA 140 11/02/2016   K 4.5 11/02/2016   CL 104 11/02/2016   CREATININE 1.49 11/02/2016   BUN 25 (H) 11/02/2016   CO2 26 11/02/2016   TSH 3.33 08/26/2013   INR 1.45 02/29/2016   HGBA1C 8.9 (H) 11/02/2016   MICROALBUR 17.2 (H) 05/23/2012   Ecg today shows NSR with rate 83. Old inferoposterior and lateral infarct. No acute change. I have personally reviewed and interpreted this study.    Assessment / Plan: 1. Orthostatic hypotension. This has been a chronic problem- ? Dysautonomia due to DM. It is exacerbated by medication- especially diuretics and Flomax. Will stop Flomax and Maxide. Refer to Urology for obstructive urinary symptoms. Needs to avoid alpha blockers. Recommend compression stockings. Will follow up in 3 months. Need to avoid overly aggressive BP control.   2. CAD s/p DES of mid LCx in January 2017. No recurrent angina. Repeat cardiac cath in June 2017 showed continued patency. Off antiplatelet therapy. Continue Xarelto.  3. Hypertension controlled. See #1.   4. CKD stage 3.   5. Paroxysmal Afib. Continue metoprolol and Xarelto. Xarelto dose reduced due to CKD.   6. History of CVA.  7. DM per  Dr Silvio Pate.   I will follow up in 6 months.

## 2016-12-19 NOTE — Telephone Encounter (Signed)
Pt c/o Syncope: STAT if syncope occurred within 30 minutes and pt complains of lightheadedness High Priority if episode of passing out, completely, today or in last 24 hours   1. Did you pass out today? no  When is the last time you passed out? Almost 12/18/2016 2. Has this occurred multiple times? yes  3. Did you have any symptoms prior to passing out? dizzy

## 2016-12-25 ENCOUNTER — Telehealth: Payer: Self-pay | Admitting: Cardiology

## 2016-12-25 ENCOUNTER — Telehealth: Payer: Self-pay

## 2016-12-25 NOTE — Telephone Encounter (Signed)
Pt left v/m; cardiologist stopped the Flomax due to fluctuation of BP. Pt request a substitute med for Flomax. Pt request cb.

## 2016-12-25 NOTE — Telephone Encounter (Signed)
New message     Pt has not received a call back from the Urologist,  Pt can not take the flomaxx , he is not able to urinate or have a bm.

## 2016-12-25 NOTE — Telephone Encounter (Signed)
Spoke to pt. He said he is already having issues with low urine stream. Causing back pain. He has been off of it a week. Started having issues 12-22-16. He is taking finasteride. Wonders if he can take more of that. He is being referred to a urologist.

## 2016-12-25 NOTE — Telephone Encounter (Signed)
Returned call to patient Dr. Martinique had advised patient stop Flomax (d/t orthostatic BP)  -- urology referral was ordered but patient has not heard about an appt Patient was doing OK up until Saturday  Hard to urinate and/or have BM  PCP Rx'ed Flomax Patient talked to PCP office's nurse - she states PCP does not have recommendation for a med similar to Flomax   Patient wants to know if he can stop ramipril and take Flomax? He states his BP is generally fine  Will defer to DOD & Dr. Martinique

## 2016-12-25 NOTE — Telephone Encounter (Signed)
There is nothing to substitute that might not affect his blood pressure also. He will have to see if he has more problems voiding, and if stopping the med helps his blood pressure issues.

## 2016-12-26 ENCOUNTER — Other Ambulatory Visit: Payer: Self-pay

## 2016-12-26 DIAGNOSIS — N401 Enlarged prostate with lower urinary tract symptoms: Secondary | ICD-10-CM | POA: Diagnosis not present

## 2016-12-26 DIAGNOSIS — R350 Frequency of micturition: Secondary | ICD-10-CM | POA: Diagnosis not present

## 2016-12-26 DIAGNOSIS — R3915 Urgency of urination: Secondary | ICD-10-CM | POA: Diagnosis not present

## 2016-12-26 DIAGNOSIS — R338 Other retention of urine: Secondary | ICD-10-CM | POA: Diagnosis not present

## 2016-12-26 NOTE — Telephone Encounter (Signed)
More finasteride won't help. If his dizziness (upon standing) is not any better off the medication, he can restart it (he should know after just a few days)

## 2016-12-26 NOTE — Telephone Encounter (Signed)
Spoke to pt. He is seeing the urologist this morning.

## 2016-12-27 ENCOUNTER — Telehealth: Payer: Self-pay | Admitting: Cardiology

## 2016-12-27 NOTE — Telephone Encounter (Signed)
He has had orthostatic symptoms on Flomax in the past and currently. I think this is the culprit and not his other meds. I really think he needs to see urology but if he continues on Flomax he is still at risk for passing out, falling, etc. He should not drive if taking this medication.   Seymone Forlenza Martinique MD, [facility]

## 2016-12-27 NOTE — Telephone Encounter (Signed)
Spoke to patient 12/26/16.Stated he has appointment with [facility] 4/11 at 11:30 AM.

## 2016-12-27 NOTE — Telephone Encounter (Signed)
Returned call to [facility] with [facility].Advised Dr.Jordan out of office this week.I will speak to him on Mon 12/31/16 about surgical clearance and call you back.

## 2016-12-27 NOTE — Telephone Encounter (Signed)
New message     Request for surgical clearance:  What type of surgery is being performed?  TURP 1. When is this surgery scheduled?  Pending clearance  Are there any medications that need to be held prior to surgery and how long?  Hold xarelto and need cardiac clearance Name of physician performing surgery?  Dr Diona Fanti What is your office phone and fax number?  Fax [phone]

## 2016-12-31 NOTE — Telephone Encounter (Signed)
Spoke to [place] he advised ok to hold Xarelto 48 to 72 hours prior to surgery.Note faxed to [facility] at [facility] at fax # [phone].

## 2017-01-01 NOTE — Telephone Encounter (Signed)
See 12/31/16 note.

## 2017-01-02 ENCOUNTER — Telehealth: Payer: Self-pay | Admitting: Cardiology

## 2017-01-02 NOTE — Telephone Encounter (Signed)
Resent information from 12/25/16 phone note at 1:05 pm

## 2017-01-02 NOTE — Telephone Encounter (Signed)
Adrian Singh says she is waiting for the clearance or note that pt is cleared for surgery please.

## 2017-01-03 ENCOUNTER — Other Ambulatory Visit: Payer: Self-pay | Admitting: Urology

## 2017-01-03 ENCOUNTER — Telehealth: Payer: Self-pay | Admitting: Cardiology

## 2017-01-03 NOTE — Telephone Encounter (Signed)
Returned call to patient he stated he has not heard from [facility].Spoke to [place] at [facility] she stated she will be calling patient this afternoon to schedule his surgery.

## 2017-01-03 NOTE — Telephone Encounter (Signed)
Patient calling states that he was not able to get through to [facility]. Patient states that he advised to call for an appointment. Patient states that maybe Malachy Mood would be able to speak with someone at Dr. Annie Main Dahlstedt's  Office to schedule an appointment. Please call to discuss,thanks.

## 2017-01-10 DIAGNOSIS — E1140 Type 2 diabetes mellitus with diabetic neuropathy, unspecified: Secondary | ICD-10-CM | POA: Diagnosis not present

## 2017-01-11 NOTE — Patient Instructions (Addendum)
Adrian Singh  01/11/2017   Your procedure is scheduled on: 01/21/17  Report to [facility] Main  Entrance Take [place]  elevators to 3rd floor to  [place] at    [phone].    Call this number if you have problems the morning of surgery [phone]    Remember: ONLY 1 PERSON MAY GO WITH YOU TO SHORT STAY TO GET  READY MORNING OF YOUR SURGERY.  Do not eat food or drink liquids :After Midnight.                  THE DAY BEFORE SURGERY: ONLY TAKE MORNING AND LUNCH DOSE OF AMARYL(GLIMEPIRIDE), TAKE METFORMIN AS USUAL DAY BEFORE SURGERY      Take these medicines the morning of surgery with A SIP OF WATER: metoprolol, finasteride.  DO NOT TAKE ANY DIABETIC MEDICATIONS DAY OF YOUR SURGERY                               You may not have any metal on your body including hair pins and              piercings  Do not wear jewelry,lotions, powders or perfumes, deodorant                          Men may shave face and neck.   Do not bring valuables to the hospital. [place].  Contacts, dentures or bridgework may not be worn into surgery.  Leave suitcase in the car. After surgery it may be brought to your room.                 Please read over the following fact sheets you were given: _____________________________________________________________________            [facility] - Preparing for Surgery Before surgery, you can play an important role.  Because skin is not sterile, your skin needs to be as free of germs as possible.  You can reduce the number of germs on your skin by washing with CHG (chlorahexidine gluconate) soap before surgery.  CHG is an antiseptic cleaner which kills germs and bonds with the skin to continue killing germs even after washing. Please DO NOT use if you have an allergy to CHG or antibacterial soaps.  If your skin becomes reddened/irritated stop using the CHG and inform your nurse when you  arrive at Short Stay. Do not shave (including legs and underarms) for at least 48 hours prior to the first CHG shower.  You may shave your face/neck. Please follow these instructions carefully:  1.  Shower with CHG Soap the night before surgery and the  morning of Surgery.  2.  If you choose to wash your hair, wash your hair first as usual with your  normal  shampoo.  3.  After you shampoo, rinse your hair and body thoroughly to remove the  shampoo.                           4.  Use CHG as you would any other liquid soap.  You can apply chg directly  to the skin and wash  Gently with a scrungie or clean washcloth.  5.  Apply the CHG Soap to your body ONLY FROM THE NECK DOWN.   Do not use on face/ open                           Wound or open sores. Avoid contact with eyes, ears mouth and genitals (private parts).                       Wash face,  Genitals (private parts) with your normal soap.             6.  Wash thoroughly, paying special attention to the area where your surgery  will be performed.  7.  Thoroughly rinse your body with warm water from the neck down.  8.  DO NOT shower/wash with your normal soap after using and rinsing off  the CHG Soap.                9.  Pat yourself dry with a clean towel.            10.  Wear clean pajamas.            11.  Place clean sheets on your bed the night of your first shower and do not  sleep with pets. Day of Surgery : Do not apply any lotions/deodorants the morning of surgery.  Please wear clean clothes to the hospital/surgery center.  FAILURE TO FOLLOW THESE INSTRUCTIONS MAY RESULT IN THE CANCELLATION OF YOUR SURGERY PATIENT SIGNATURE_________________________________  NURSE SIGNATURE__________________________________  ________________________________________________________________________

## 2017-01-14 ENCOUNTER — Encounter (HOSPITAL_COMMUNITY)
Admission: RE | Admit: 2017-01-14 | Discharge: 2017-01-14 | Disposition: A | Discharge status: Home / Self Care | Payer: Medicare Other | Source: Ambulatory Visit | Attending: Urology | Admitting: Urology

## 2017-01-14 ENCOUNTER — Encounter (HOSPITAL_COMMUNITY): Payer: Self-pay

## 2017-01-14 DIAGNOSIS — Z01812 Encounter for preprocedural laboratory examination: Secondary | ICD-10-CM | POA: Diagnosis not present

## 2017-01-14 DIAGNOSIS — E119 Type 2 diabetes mellitus without complications: Secondary | ICD-10-CM | POA: Insufficient documentation

## 2017-01-14 DIAGNOSIS — D696 Thrombocytopenia, unspecified: Secondary | ICD-10-CM | POA: Insufficient documentation

## 2017-01-14 DIAGNOSIS — N400 Enlarged prostate without lower urinary tract symptoms: Secondary | ICD-10-CM | POA: Diagnosis not present

## 2017-01-14 HISTORY — DX: Presence of coronary angioplasty implant and graft: Z95.5

## 2017-01-14 HISTORY — DX: Cardiac arrhythmia, unspecified: I49.9

## 2017-01-14 HISTORY — DX: Personal history of urinary calculi: Z87.442

## 2017-01-14 LAB — BASIC METABOLIC PANEL
Anion gap: 7 (ref 5–15)
BUN: 24 mg/dL — ABNORMAL HIGH (ref 6–20)
CO2: 29 mmol/L (ref 22–32)
Calcium: 9.3 mg/dL (ref 8.9–10.3)
Chloride: 102 mmol/L (ref 101–111)
Creatinine, Ser: 1.22 mg/dL (ref 0.61–1.24)
GFR calc Af Amer: >60 mL/min (ref >60)
GFR calc non Af Amer: 54 mL/min — ABNORMAL LOW (ref >60)
Glucose, Bld: 163 mg/dL — ABNORMAL HIGH (ref 65–99)
Potassium: 4.7 mmol/L (ref 3.5–5.1)
Sodium: 138 mmol/L (ref 135–145)

## 2017-01-14 LAB — CBC
HCT: 36.1 % — ABNORMAL LOW (ref 39.0–52.0)
Hemoglobin: 11.4 g/dL — ABNORMAL LOW (ref 13.0–17.0)
MCH: 28.1 pg (ref 26.0–34.0)
MCHC: 31.6 g/dL (ref 30.0–36.0)
MCV: 88.9 fL (ref 78.0–100.0)
Platelets: 137 10*3/uL — ABNORMAL LOW (ref 150–400)
RBC: 4.06 MIL/uL — ABNORMAL LOW (ref 4.22–5.81)
RDW: 13.2 % (ref 11.5–15.5)
WBC: 6.1 10*3/uL (ref 4.0–10.5)

## 2017-01-14 LAB — PSA: PSA: 158 ng/mL — ABNORMAL HIGH (ref 0.00–4.00)

## 2017-01-14 LAB — GLUCOSE, CAPILLARY: Glucose-Capillary: 196 mg/dL — ABNORMAL HIGH (ref 65–99)

## 2017-01-14 NOTE — Progress Notes (Signed)
ekg 12/19/16 Cath 02/29/16 Echo 02/29/16  cxr 2 view 02/28/16  ALL IN EPIC

## 2017-01-14 NOTE — Progress Notes (Addendum)
Cbc and bmp done 01/14/17 routed to Dr. Beatrix Fetters via epic

## 2017-01-15 LAB — HEMOGLOBIN A1C
Hgb A1c MFr Bld: 8.2 % — ABNORMAL HIGH (ref 4.8–5.6)
Mean Plasma Glucose: 189 mg/dL

## 2017-01-15 NOTE — Progress Notes (Signed)
hemaglobin A1C and psa results faxed by epic to dr Annie Main dahlstedt

## 2017-01-21 ENCOUNTER — Ambulatory Visit (HOSPITAL_COMMUNITY): Payer: Medicare Other | Admitting: Certified Registered Nurse Anesthetist

## 2017-01-21 ENCOUNTER — Encounter (HOSPITAL_COMMUNITY)
Admission: RE | Disposition: A | Discharge status: Home / Self Care | Payer: Self-pay | Source: Ambulatory Visit | Attending: Urology

## 2017-01-21 ENCOUNTER — Encounter (HOSPITAL_COMMUNITY): Payer: Self-pay | Admitting: *Deleted

## 2017-01-21 ENCOUNTER — Observation Stay (HOSPITAL_COMMUNITY)
Admission: RE | Admit: 2017-01-21 | Discharge: 2017-01-22 | Disposition: A | Discharge status: Home / Self Care | Payer: Medicare Other | Source: Ambulatory Visit | Attending: Urology | Admitting: Urology

## 2017-01-21 DIAGNOSIS — I2510 Atherosclerotic heart disease of native coronary artery without angina pectoris: Secondary | ICD-10-CM | POA: Diagnosis not present

## 2017-01-21 DIAGNOSIS — Z7984 Long term (current) use of oral hypoglycemic drugs: Secondary | ICD-10-CM | POA: Insufficient documentation

## 2017-01-21 DIAGNOSIS — I10 Essential (primary) hypertension: Secondary | ICD-10-CM | POA: Diagnosis not present

## 2017-01-21 DIAGNOSIS — Z955 Presence of coronary angioplasty implant and graft: Secondary | ICD-10-CM | POA: Diagnosis not present

## 2017-01-21 DIAGNOSIS — Z85828 Personal history of other malignant neoplasm of skin: Secondary | ICD-10-CM | POA: Diagnosis not present

## 2017-01-21 DIAGNOSIS — N401 Enlarged prostate with lower urinary tract symptoms: Secondary | ICD-10-CM | POA: Diagnosis not present

## 2017-01-21 DIAGNOSIS — I480 Paroxysmal atrial fibrillation: Secondary | ICD-10-CM | POA: Diagnosis not present

## 2017-01-21 DIAGNOSIS — R339 Retention of urine, unspecified: Secondary | ICD-10-CM | POA: Diagnosis not present

## 2017-01-21 DIAGNOSIS — I4891 Unspecified atrial fibrillation: Secondary | ICD-10-CM | POA: Insufficient documentation

## 2017-01-21 DIAGNOSIS — C61 Malignant neoplasm of prostate: Principal | ICD-10-CM | POA: Insufficient documentation

## 2017-01-21 DIAGNOSIS — M1990 Unspecified osteoarthritis, unspecified site: Secondary | ICD-10-CM | POA: Insufficient documentation

## 2017-01-21 DIAGNOSIS — E1140 Type 2 diabetes mellitus with diabetic neuropathy, unspecified: Secondary | ICD-10-CM | POA: Diagnosis not present

## 2017-01-21 DIAGNOSIS — Z87891 Personal history of nicotine dependence: Secondary | ICD-10-CM | POA: Diagnosis not present

## 2017-01-21 DIAGNOSIS — K219 Gastro-esophageal reflux disease without esophagitis: Secondary | ICD-10-CM | POA: Diagnosis not present

## 2017-01-21 DIAGNOSIS — Z8673 Personal history of transient ischemic attack (TIA), and cerebral infarction without residual deficits: Secondary | ICD-10-CM | POA: Diagnosis not present

## 2017-01-21 DIAGNOSIS — D494 Neoplasm of unspecified behavior of bladder: Secondary | ICD-10-CM | POA: Diagnosis not present

## 2017-01-21 DIAGNOSIS — E119 Type 2 diabetes mellitus without complications: Secondary | ICD-10-CM | POA: Insufficient documentation

## 2017-01-21 DIAGNOSIS — Z7901 Long term (current) use of anticoagulants: Secondary | ICD-10-CM | POA: Insufficient documentation

## 2017-01-21 DIAGNOSIS — N400 Enlarged prostate without lower urinary tract symptoms: Secondary | ICD-10-CM | POA: Diagnosis present

## 2017-01-21 DIAGNOSIS — N138 Other obstructive and reflux uropathy: Secondary | ICD-10-CM

## 2017-01-21 DIAGNOSIS — R338 Other retention of urine: Secondary | ICD-10-CM | POA: Diagnosis not present

## 2017-01-21 HISTORY — PX: THULIUM LASER TURP (TRANSURETHRAL RESECTION OF PROSTATE): SHX6744

## 2017-01-21 LAB — GLUCOSE, CAPILLARY
Glucose-Capillary: 155 mg/dL — ABNORMAL HIGH (ref 65–99)
Glucose-Capillary: 213 mg/dL — ABNORMAL HIGH (ref 65–99)
Glucose-Capillary: 221 mg/dL — ABNORMAL HIGH (ref 65–99)
Glucose-Capillary: 290 mg/dL — ABNORMAL HIGH (ref 65–99)
Glucose-Capillary: 311 mg/dL — ABNORMAL HIGH (ref 65–99)

## 2017-01-21 SURGERY — THULIUM LASER TURP (TRANSURETHRAL RESECTION OF PROSTATE)
Anesthesia: General

## 2017-01-21 MED ORDER — LIDOCAINE HCL 2 % EX GEL
CUTANEOUS | Status: AC
  Filled 2017-01-21: qty 5

## 2017-01-21 MED ORDER — LABETALOL HCL 5 MG/ML IV SOLN
5.0000 mg | Freq: Once | INTRAVENOUS | Status: AC
  Administered 2017-01-21: 5 mg via INTRAVENOUS

## 2017-01-21 MED ORDER — ROCURONIUM BROMIDE 50 MG/5ML IV SOSY
PREFILLED_SYRINGE | INTRAVENOUS | Status: DC | PRN
  Administered 2017-01-21 (×2): 10 mg via INTRAVENOUS
  Administered 2017-01-21: 50 mg via INTRAVENOUS

## 2017-01-21 MED ORDER — ONDANSETRON HCL 4 MG/2ML IJ SOLN
INTRAMUSCULAR | Status: DC | PRN
  Administered 2017-01-21: 4 mg via INTRAVENOUS

## 2017-01-21 MED ORDER — 0.9 % SODIUM CHLORIDE (POUR BTL) OPTIME
TOPICAL | Status: DC | PRN
  Administered 2017-01-21: 1000 mL

## 2017-01-21 MED ORDER — ADULT MULTIVITAMIN W/MINERALS CH
1.0000 | ORAL_TABLET | ORAL | Status: DC
  Administered 2017-01-21: 1 via ORAL
  Filled 2017-01-21: qty 1

## 2017-01-21 MED ORDER — ROCURONIUM BROMIDE 50 MG/5ML IV SOSY
PREFILLED_SYRINGE | INTRAVENOUS | Status: AC
  Filled 2017-01-21: qty 5

## 2017-01-21 MED ORDER — HYDROCODONE-ACETAMINOPHEN 5-325 MG PO TABS
1.0000 | ORAL_TABLET | ORAL | Status: DC | PRN
  Administered 2017-01-21 – 2017-01-22 (×4): 1 via ORAL
  Filled 2017-01-21 (×3): qty 1
  Filled 2017-01-21: qty 2

## 2017-01-21 MED ORDER — ATORVASTATIN CALCIUM 40 MG PO TABS
40.0000 mg | ORAL_TABLET | Freq: Every day | ORAL | Status: DC
  Administered 2017-01-21 – 2017-01-22 (×2): 40 mg via ORAL
  Filled 2017-01-21 (×2): qty 1

## 2017-01-21 MED ORDER — DEXAMETHASONE SODIUM PHOSPHATE 10 MG/ML IJ SOLN
INTRAMUSCULAR | Status: DC | PRN
Start: 2017-01-21 — End: 2017-01-21
  Administered 2017-01-21: 5 mg via INTRAVENOUS

## 2017-01-21 MED ORDER — LIDOCAINE 2% (20 MG/ML) 5 ML SYRINGE
INTRAMUSCULAR | Status: DC | PRN
  Administered 2017-01-21: 100 mg via INTRAVENOUS

## 2017-01-21 MED ORDER — DOCUSATE SODIUM 50 MG PO CAPS
250.0000 mg | ORAL_CAPSULE | Freq: Every day | ORAL | Status: DC
  Administered 2017-01-22: 250 mg via ORAL
  Filled 2017-01-21 (×2): qty 1

## 2017-01-21 MED ORDER — BELLADONNA ALKALOIDS-OPIUM 16.2-60 MG RE SUPP
RECTAL | Status: AC
Start: 2017-01-21 — End: 2017-01-21
  Filled 2017-01-21: qty 1

## 2017-01-21 MED ORDER — NITROGLYCERIN 0.4 MG SL SUBL: 0.4000 mg | SUBLINGUAL_TABLET | SUBLINGUAL | Status: DC | PRN

## 2017-01-21 MED ORDER — DEXAMETHASONE SODIUM PHOSPHATE 10 MG/ML IJ SOLN
INTRAMUSCULAR | Status: AC
  Filled 2017-01-21: qty 1

## 2017-01-21 MED ORDER — SULFAMETHOXAZOLE-TRIMETHOPRIM 800-160 MG PO TABS
1.0000 | ORAL_TABLET | Freq: Two times a day (BID) | ORAL | Status: DC
  Administered 2017-01-21 – 2017-01-22 (×3): 1 via ORAL
  Filled 2017-01-21 (×3): qty 1

## 2017-01-21 MED ORDER — NAPROXEN SODIUM 275 MG PO TABS
275.0000 mg | ORAL_TABLET | Freq: Every day | ORAL | Status: DC
  Administered 2017-01-22: 275 mg via ORAL
  Filled 2017-01-21: qty 1

## 2017-01-21 MED ORDER — FENTANYL CITRATE (PF) 100 MCG/2ML IJ SOLN
INTRAMUSCULAR | Status: DC | PRN
  Administered 2017-01-21 (×7): 50 ug via INTRAVENOUS

## 2017-01-21 MED ORDER — GLIMEPIRIDE 4 MG PO TABS
4.0000 mg | ORAL_TABLET | Freq: Two times a day (BID) | ORAL | Status: DC
Start: 2017-01-21 — End: 2017-01-22
  Administered 2017-01-21 – 2017-01-22 (×2): 4 mg via ORAL
  Filled 2017-01-21 (×2): qty 1

## 2017-01-21 MED ORDER — FENTANYL CITRATE (PF) 100 MCG/2ML IJ SOLN
25.0000 ug | INTRAMUSCULAR | Status: DC | PRN
  Administered 2017-01-21 (×2): 50 ug via INTRAVENOUS

## 2017-01-21 MED ORDER — LABETALOL HCL 5 MG/ML IV SOLN
INTRAVENOUS | Status: AC
  Filled 2017-01-21: qty 4

## 2017-01-21 MED ORDER — FENTANYL CITRATE (PF) 250 MCG/5ML IJ SOLN
INTRAMUSCULAR | Status: AC
Start: 2017-01-21 — End: 2017-01-21
  Filled 2017-01-21: qty 5

## 2017-01-21 MED ORDER — PROPOFOL 10 MG/ML IV BOLUS
INTRAVENOUS | Status: DC | PRN
  Administered 2017-01-21: 100 mg via INTRAVENOUS

## 2017-01-21 MED ORDER — LACTATED RINGERS IV SOLN
INTRAVENOUS | Status: DC
  Administered 2017-01-21: 10:00:00 via INTRAVENOUS

## 2017-01-21 MED ORDER — FENTANYL CITRATE (PF) 100 MCG/2ML IJ SOLN
INTRAMUSCULAR | Status: AC
  Filled 2017-01-21: qty 2

## 2017-01-21 MED ORDER — ONDANSETRON HCL 4 MG/2ML IJ SOLN: 4.0000 mg | Freq: Once | INTRAMUSCULAR | Status: DC | PRN

## 2017-01-21 MED ORDER — SUGAMMADEX SODIUM 200 MG/2ML IV SOLN
INTRAVENOUS | Status: DC | PRN
  Administered 2017-01-21: 200 mg via INTRAVENOUS

## 2017-01-21 MED ORDER — INSULIN ASPART 100 UNIT/ML ~~LOC~~ SOLN
0.0000 [IU] | Freq: Three times a day (TID) | SUBCUTANEOUS | Status: DC
  Administered 2017-01-21: 8 [IU] via SUBCUTANEOUS
  Administered 2017-01-21: 5 [IU] via SUBCUTANEOUS

## 2017-01-21 MED ORDER — LABETALOL HCL 5 MG/ML IV SOLN
INTRAVENOUS | Status: DC | PRN
  Administered 2017-01-21 (×2): 2.5 mg via INTRAVENOUS

## 2017-01-21 MED ORDER — SODIUM CHLORIDE 0.45 % IV SOLN
INTRAVENOUS | Status: DC
  Administered 2017-01-21: 12:00:00 via INTRAVENOUS

## 2017-01-21 MED ORDER — LACTATED RINGERS IV SOLN
INTRAVENOUS | Status: DC | PRN
  Administered 2017-01-21: 07:00:00 via INTRAVENOUS

## 2017-01-21 MED ORDER — PROPOFOL 10 MG/ML IV BOLUS
INTRAVENOUS | Status: AC
  Filled 2017-01-21: qty 20

## 2017-01-21 MED ORDER — BELLADONNA ALKALOIDS-OPIUM 16.2-60 MG RE SUPP: 1.0000 | Freq: Four times a day (QID) | RECTAL | Status: DC | PRN

## 2017-01-21 MED ORDER — LIDOCAINE 2% (20 MG/ML) 5 ML SYRINGE
INTRAMUSCULAR | Status: AC
  Filled 2017-01-21: qty 5

## 2017-01-21 MED ORDER — EPHEDRINE SULFATE 50 MG/ML IJ SOLN
INTRAMUSCULAR | Status: DC | PRN
  Administered 2017-01-21 (×3): 5 mg via INTRAVENOUS

## 2017-01-21 MED ORDER — METOPROLOL SUCCINATE ER 50 MG PO TB24
50.0000 mg | ORAL_TABLET | Freq: Every day | ORAL | Status: DC
  Administered 2017-01-22: 50 mg via ORAL
  Filled 2017-01-21: qty 1

## 2017-01-21 MED ORDER — CEFAZOLIN SODIUM-DEXTROSE 2-4 GM/100ML-% IV SOLN: 2.0000 g | INTRAVENOUS | Status: DC

## 2017-01-21 MED ORDER — RAMIPRIL 2.5 MG PO CAPS
2.5000 mg | ORAL_CAPSULE | Freq: Every day | ORAL | Status: DC
  Filled 2017-01-21: qty 1

## 2017-01-21 MED ORDER — EPHEDRINE 5 MG/ML INJ
INTRAVENOUS | Status: AC
  Filled 2017-01-21: qty 10

## 2017-01-21 MED ORDER — ONDANSETRON HCL 4 MG/2ML IJ SOLN
INTRAMUSCULAR | Status: AC
  Filled 2017-01-21: qty 2

## 2017-01-21 MED ORDER — SODIUM CHLORIDE 0.9 % IR SOLN
Status: DC | PRN
  Administered 2017-01-21: 24000 mL via INTRAVESICAL

## 2017-01-21 MED ORDER — CEFAZOLIN SODIUM-DEXTROSE 2-4 GM/100ML-% IV SOLN
INTRAVENOUS | Status: AC
  Filled 2017-01-21: qty 100

## 2017-01-21 MED ORDER — CEFAZOLIN SODIUM-DEXTROSE 2-4 GM/100ML-% IV SOLN
2.0000 g | INTRAVENOUS | Status: AC
  Administered 2017-01-21: 2 g via INTRAVENOUS

## 2017-01-21 SURGICAL SUPPLY — 17 items
BAG URINE DRAINAGE (UROLOGICAL SUPPLIES) ×3 IMPLANT
BAG URO CATCHER STRL LF (MISCELLANEOUS) ×3 IMPLANT
CATH FOLEY 2WAY SLVR 30CC 22FR (CATHETERS) IMPLANT
CATH FOLEY 3WAY 30CC 22FR (CATHETERS) ×3 IMPLANT
COVER SURGICAL LIGHT HANDLE (MISCELLANEOUS) ×3 IMPLANT
GLOVE BIOGEL M 8.0 STRL (GLOVE) ×3 IMPLANT
GOWN STRL REUS W/TWL XL LVL3 (GOWN DISPOSABLE) ×3 IMPLANT
HOLDER FOLEY CATH W/STRAP (MISCELLANEOUS) IMPLANT
LASER FIBER SIDE FIRE SU 550UM (MISCELLANEOUS) ×2 IMPLANT
LASER REVOLIX PROCEDURE (MISCELLANEOUS) ×3 IMPLANT
MANIFOLD NEPTUNE II (INSTRUMENTS) ×3 IMPLANT
PACK CYSTO (CUSTOM PROCEDURE TRAY) ×3 IMPLANT
PLUG CATH AND CAP STER (CATHETERS) ×3 IMPLANT
SYR 30ML LL (SYRINGE) IMPLANT
SYRINGE IRR TOOMEY STRL 70CC (SYRINGE) IMPLANT
TUBING CONNECTING 10 (TUBING) ×4 IMPLANT
TUBING CONNECTING 10' (TUBING) ×2

## 2017-01-21 NOTE — Anesthesia Procedure Notes (Signed)
Procedure Name: Intubation Date/Time: 01/21/2017 7:39 AM Performed by: West Pugh Pre-anesthesia Checklist: Patient identified, Emergency Drugs available, Suction available, Patient being monitored and Timeout performed Patient Re-evaluated:Patient Re-evaluated prior to inductionOxygen Delivery Method: Circle system utilized Preoxygenation: Pre-oxygenation with 100% oxygen Intubation Type: IV induction and Cricoid Pressure applied Ventilation: Mask ventilation without difficulty and Oral airway inserted - appropriate to patient size Laryngoscope Size: Mac and 4 Grade View: Grade II Tube type: Oral Tube size: 7.5 mm Number of attempts: 2 (1st attempt-cuff slid up above cords., 2nd attempt successful.) Airway Equipment and Method: Stylet Placement Confirmation: ETT inserted through vocal cords under direct vision,  positive ETCO2,  CO2 detector and breath sounds checked- equal and bilateral Secured at: 23 cm Tube secured with: Tape Dental Injury: Teeth and Oropharynx as per pre-operative assessment

## 2017-01-21 NOTE — Anesthesia Postprocedure Evaluation (Signed)
Anesthesia Post Note  Patient: DARIO YONO  Procedure(s) Performed: Procedure(s) (LRB): THULIUM LASER TURP (TRANSURETHRAL RESECTION OF PROSTATE) CAUTERIZATION OF BLADDER LESION (N/A)  Patient location during evaluation: PACU Anesthesia Type: General Level of consciousness: awake and alert Pain management: pain level controlled Vital Signs Assessment: post-procedure vital signs reviewed and stable Respiratory status: spontaneous breathing, nonlabored ventilation, respiratory function stable and patient connected to nasal cannula oxygen Cardiovascular status: blood pressure returned to baseline and stable Postop Assessment: no signs of nausea or vomiting Anesthetic complications: no       Last Vitals:  Vitals:   01/21/17 1055 01/21/17 1113  BP: (!) 169/79 (!) 182/71  Pulse: 76   Resp: 18 17  Temp:  36.8 C    Last Pain:  Vitals:   01/21/17 1123  TempSrc:   PainSc: 0-No pain                 Catalina Gravel

## 2017-01-21 NOTE — Transfer of Care (Signed)
Immediate Anesthesia Transfer of Care Note  Patient: Adrian Singh  Procedure(s) Performed: Procedure(s): THULIUM LASER TURP (TRANSURETHRAL RESECTION OF PROSTATE) CAUTERIZATION OF BLADDER LESION (N/A)  Patient Location: PACU  Anesthesia Type:General  Level of Consciousness:  sedated, patient cooperative and responds to stimulation  Airway & Oxygen Therapy:Patient Spontanous Breathing and Patient connected to face mask oxgen  Post-op Assessment:  Report given to PACU RN and Post -op Vital signs reviewed and stable  Post vital signs:  Reviewed and stable  Last Vitals:  Vitals:   01/21/17 0540  BP: (!) 157/83  Pulse: 82  Resp: 18  Temp: 99.8 C    Complications: No apparent anesthesia complications

## 2017-01-21 NOTE — Interval H&P Note (Signed)
History and Physical Interval Note:  01/21/2017 7:23 AM  Adrian Singh  has presented today for surgery, with the diagnosis of BENIGN PROSTATIC HYPERPLASIA WITH RENTENTION  The various methods of treatment have been discussed with the patient and family. After consideration of risks, benefits and other options for treatment, the patient has consented to  Procedure(s): THULIUM LASER TURP (TRANSURETHRAL RESECTION OF PROSTATE) (N/A) as a surgical intervention .  The patient's history has been reviewed, patient examined, no change in status, stable for surgery.  I have reviewed the patient's chart and labs.  Questions were answered to the patient's satisfaction.     Jorja Loa

## 2017-01-21 NOTE — H&P (Signed)
H&P  Chief Complaint: Urinary retention  History of Present Illness: 83 year old male with long-standing history of BPH, he has been on both Flomax and finasteride. Because of recent orthostasis, he has been taken off the Flomax. Unfortunately, his urinary symptoms have increased significantly. He is feeling like he is in urinary retention, has a slow stream. Recent PV volume was measured at 158 ml. IPSS 34, QOL score. 6. His prostate did have nodularity, and PSA was significantly elevated at 158.   Past Medical History:  Diagnosis Date  . Arthritis    "legs, back" (10/06/2015)  . Atrial fibrillation ([place])    x1  . CAD (coronary artery disease)    nonobstructive  . Cancer ([place])    skin cancer on ear (froze it off) and back (cut it off)  . Dysrhythmia   . Esophageal reflux    hx of  . History of kidney stones   . Hx of heart artery stent   . Hypertrophy of prostate without urinary obstruction and other lower urinary tract symptoms (LUTS)   . Osteoarthrosis, unspecified whether generalized or localized, unspecified site   . Stroke [facility]) 03/2014   "had a series of mini strokes; maybe 4"; denies residual on 10/06/2015  . Thrombocytopenia ([place])   . Type II diabetes mellitus ([place])    type 2  . Unspecified essential hypertension     Past Surgical History:  Procedure Laterality Date  . BACK SURGERY     2001  . CARDIAC CATHETERIZATION  12/2007  . CARDIAC CATHETERIZATION N/A 09/22/2015   Procedure: Left Heart Cath and Coronary Angiography;  Surgeon: Peter M Martinique, MD;  Location: [facility] [facility];  Service: Cardiovascular;  Laterality: N/A;  . CARDIAC CATHETERIZATION N/A 10/06/2015   Procedure: Coronary Stent Intervention;  Surgeon: Peter M Martinique, MD;  Location: [facility] [facility];  Service: Cardiovascular;  Laterality: N/A;  . CARDIAC CATHETERIZATION N/A 02/29/2016   Procedure: Left Heart Cath and Coronary Angiography;  Surgeon: Jettie Booze, MD;  Location: [place] [facility];   Service: Cardiovascular;  Laterality: N/A;  . COLONOSCOPY W/ BIOPSIES AND POLYPECTOMY    . CORONARY STENT PLACEMENT  10/06/2015   [name]  with DES  . EAR CYST EXCISION N/A 04/06/2015   Procedure: EXCISION OF SCALP CYST;  Surgeon: Donnie Mesa, MD;  Location: [facility];  Service: General;  Laterality: N/A;  . ESOPHAGOGASTRODUODENOSCOPY (EGD) WITH ESOPHAGEAL DILATION  2001   with dilation  . LUMBAR DISC SURGERY  09/26/1999   "cleaned out arthritis and bone spurs"  . SHOULDER ARTHROSCOPY W/ ROTATOR CUFF REPAIR Left 2005    Home Medications:    Allergies:  Allergies  Allergen Reactions  . Soma [Carisoprodol] Other (See Comments)    "did a number on me"  . Doxazosin Other (See Comments)    dizziness    Family History  Problem Relation Age of Onset  . Stroke Father   . Peripheral vascular disease Father     amputation  . Heart failure Mother     CHF  . Coronary artery disease Mother   . Heart attack Mother 80    Multiple  . COPD Brother   . Heart disease Brother   . Cancer Brother     Bone  . Lung cancer Sister     Social History:  reports that he has quit smoking. His smoking use included Cigarettes. He quit after 3.00 years of use. He has never used smokeless tobacco. He reports that he does not  drink alcohol or use drugs.  ROS: GU Review Male: Patient reports frequent urination, hard to postpone urination, burning/ pain with urination, get up at night to urinate, stream starts and stops, trouble starting your stream, have to strain to urinate , erection problems, and penile pain. Patient denies leakage of urine. Gastrointestinal (Upper): Patient denies nausea, vomiting, and indigestion/ heartburn. Gastrointestinal (Lower): Patient reports diarrhea. Patient denies constipation. Constitutional: Patient denies fever, night sweats, weight loss, and fatigue. Skin: Patient denies itching and skin rash/ lesion. Eyes: Patient reports blurred vision. Patient denies double vision. Ears/ Nose/  Throat: Patient denies sore throat and sinus problems. Hematologic/Lymphatic: Patient reports easy bruising. Patient denies swollen glands. Cardiovascular: Patient reports leg swelling. Patient denies chest pains. Respiratory: Patient denies cough and shortness of breath. Endocrine: Patient denies excessive thirst. Musculoskeletal: Patient reports back pain. Patient denies joint pain. Neurological: Patient reports dizziness. Patient denies headaches. Psychologic: Patient reports anxiety. Patient denies depression.   Physical Exam:  Vital signs in last 24 hours: Temp:  [97.8 F (36.6 C)] 97.8 F (36.6 C) (05/07 0540) Pulse Rate:  [82] 82 (05/07 0540) Resp:  [18] 18 (05/07 0540) BP: (157)/(83) 157/83 (05/07 0540) SpO2:  [100 %] 100 % (05/07 0540) Weight:  [94.3 kg (208 lb)] 94.3 kg (208 lb) (05/07 0606) Constitutional:  Alert and oriented, No acute distress Cardiovascular: Regular rate and rhythm, No JVD Respiratory: Normal respiratory effort, Lungs clear bilaterally GI: Abdomen is soft, nontender, nondistended, no abdominal masses Genitourinary: No CVAT. Normal male phallus, testes are descended bilaterally and non-tender and without masses, scrotum is normal in appearance without lesions or masses, perineum is normal on inspection. Rectal: Normal sphincter tone, no rectal masses, prostate is non tender and without nodularity. Prostate size is estimated to be 100 cc, rt side nodularity noted. Lymphatic: No lymphadenopathy Neurologic: Grossly intact, no focal deficits Psychiatric: Normal mood and affect  Laboratory Data:  No results for input(s): WBC, HGB, HCT, PLT in the last 72 hours.  No results for input(s): NA, K, CL, GLUCOSE, BUN, CALCIUM, CREATININE in the last 72 hours.  Invalid input(s): CO3   Results for orders placed or performed during the hospital encounter of 01/21/17 (from the past 24 hour(s))  Glucose, capillary     Status: Abnormal   Collection Time: 01/21/17  5:42 AM   Result Value Ref Range   Glucose-Capillary 155 (H) 65 - 99 mg/dL   No results found for this or any previous visit (from the past 240 hour(s)).  Renal Function:  Recent Labs  01/14/17 1349  CREATININE 1.22   Estimated Creatinine Clearance: 55.7 mL/min (by C-G formula based on SCr of 1.22 mg/dL).  Radiologic Imaging: No results found.  Impression/Assessment:  BPH with obstructive symptoms. He has been on maximal medical therapy, and does not tolerate alpha blockers anymore  Plan:  BPH--with thullium laser. I will obtain resected specimens as well in order to get a pathologic review of tissue.

## 2017-01-21 NOTE — Anesthesia Preprocedure Evaluation (Signed)
Anesthesia Evaluation  Patient identified by MRN, date of birth, ID band Patient awake    Reviewed: Allergy & Precautions, NPO status , Patient's Chart, lab work & pertinent test results, reviewed documented beta blocker date and time   Airway Mallampati: II  TM Distance: >3 FB Neck ROM: Full    Dental  (+) Dental Advisory Given, Edentulous Lower, Edentulous Upper   Pulmonary former smoker,    Pulmonary exam normal breath sounds clear to auscultation       Cardiovascular hypertension, Pt. on home beta blockers and Pt. on medications + CAD and + Cardiac Stents  Normal cardiovascular exam+ dysrhythmias Atrial Fibrillation  Rhythm:Regular Rate:Normal  Echo 02/29/16: Study Conclusions  - Left ventricle: The cavity size was normal. Systolic function was mildly reduced. The estimated ejection fraction was in the range of 45% to 50%. Diffuse hypokinesis. There is akinesis of the inferoseptal myocardium. Doppler parameters are consistent with   abnormal left ventricular relaxation (grade 1 diastolic   dysfunction). Doppler parameters are consistent with high ventricular filling pressure. - Mitral valve: There was mild regurgitation. - Left atrium: The atrium was moderately dilated.   Neuro/Psych CVA, No Residual Symptoms negative neurological ROS     GI/Hepatic Neg liver ROS, GERD  ,  Endo/Other  diabetes, Type 2, Oral Hypoglycemic Agents  Renal/GU Renal InsufficiencyRenal disease     Musculoskeletal  (+) Arthritis , Osteoarthritis,    Abdominal   Peds  Hematology  (+) Blood dyscrasia (Xarelto; Thrombocytopenia), anemia ,   Anesthesia Other Findings Day of surgery medications reviewed with the patient.  Reproductive/Obstetrics                             Anesthesia Physical Anesthesia Plan  ASA: III  Anesthesia Plan: General   Post-op Pain Management:    Induction: Intravenous  Airway  Management Planned: Oral ETT  Additional Equipment:   Intra-op Plan:   Post-operative Plan: Extubation in OR  Informed Consent: I have reviewed the patients History and Physical, chart, labs and discussed the procedure including the risks, benefits and alternatives for the proposed anesthesia with the patient or authorized representative who has indicated his/her understanding and acceptance.   Dental advisory given  Plan Discussed with: CRNA  Anesthesia Plan Comments: (2nd IV after induction.)        Anesthesia Quick Evaluation

## 2017-01-21 NOTE — Op Note (Signed)
Preoperative diagnosis: Prostatic obstruction with significant lower urinary tract symptoms, probable prostate cancer.  Postoperative diagnosis: Prostatic obstruction with significant lower urinary tract symptoms, probable prostate cancer, small bladder tumor.  Principal procedure: Cauterization of 3 millimeter papillary lesion within the bladder, thulium laser prostatectomy.  Surgeon: Diona Fanti  Anesthesia: Gen. allograft complications: None  Drain: 64 French Foley catheter, to [employer].  Specimen: Prostatic biopsy from laser TUR.  Estimated blood loss: Less than 10 mL  Indications: 83 year old male who recently presented with BPH/obstruction, and intolerant of alpha blockers.  The patient had him after he came off of his alpha blockers because of significant symptoms, worsening of his urinary symptomatology.  Evaluation revealed the patient have significant symptoms with an obstructive prostate cystoscopically.  He did have mild nodularity of his prostate on digital rectal exam, and preoperative PSA is 158.  It is strongly felt the patient has prostate cancer, but his obstructive uropathy at this time necessitates aggressive management of this obstructive symptomatology, as well as biopsy of his prostate.  I have discussed laser prostatectomy with the patient, as well as risks and complications.  These include but are not limited to, dysuria, hematuria, need for transfusion, bladder neck contracture, extended course of catheterization, leakage, among others.  He understands these and desires to proceed.  Description of procedure: The patient was properly identified in the holding area.  Using the operating room where general anesthesia was administered with the LMA.  He is placed in the dorsolithotomy position.  Genitalia and perineum were prepped and draped.  Proper timeout was performed.  His urethral meatus was gently dilated with lubricated Leander Rams sounds to 28 [place] area.  I then passed a 21  French cystoscope with the laser bridge.  The urethra was without lesions.  Prostate revealed significant obstruction from trilobar hypertrophy.  It was difficult getting over the median lobe first.  Once the bladder was entered with the scope, circumferential inspection was performed.  This revealed an intravesical median lobe, a 3-5 millimeter papillary lesion between the bladder neck in the left ureteral orifice, and no other urothelial abnormalities.  The bladder.  Following identification of the ureteral orifice sees by careful inspection, I then passed the 800 micron laser fiber.  Using thulium laser energy, I first cauterized.  The small bladder lesion seen in the trigonal region.  I then, at the 5 and 7 o'clock position at the bladder neck/median lobe, made an incision distally within the urethra.  I then joined the 2 incisions, allowing a piece of the median lobe to be passed into the bladder for later pathologic evaluation.  Holmium laser energy was advanced up to 175.  Using this energy, I first started vaporization of the median lobe, from the bladder neck down to the are a of the verumontanum.  Once the entire median lobe had been adequately vaporized, I turned the attention to the right prostatic lobe and anterior prostate.  By moving the laser fiber proximally and distally, good vaporization was performed of both the anterior prostatic tissue and the entire right lateral lobe.  Resection/vaporization was carried down to the circular fibers.  Once the right lobe in the median lobe as well as anterior prostate had been opened up, then turned my attention last week.  The left prostatic lobe, which was vaporized using the same laser energy.  Once the lateral lobes had been ablated, I turned my attention to the apical tissue, both the right and left.  Using care to identify the verumontanum,  I apprised gently the apical tissue on the right and left prostate, as well as anteriorly.  Following this, the  prostatic channel was wide open.  I used the cautery setting to cauterize small bleeders.  The end of the procedure, hemostasis was excellent.  There was one significant size fragment from the prior median lobe biopsy within the bladder.  This was removed and sent for pathology.  Inspection of the bladder revealed no significant injury, and intact.  Ureteral orifices.  I then removed the scope, and placed a 22 French Foley catheter, with three-way irrigation set up.  At this point, the patient was then awakened and taken to the PACU in stable condition.  He tolerated procedure well.

## 2017-01-22 DIAGNOSIS — C61 Malignant neoplasm of prostate: Secondary | ICD-10-CM | POA: Diagnosis not present

## 2017-01-22 LAB — GLUCOSE, CAPILLARY: Glucose-Capillary: 139 mg/dL — ABNORMAL HIGH (ref 65–99)

## 2017-01-22 NOTE — Progress Notes (Signed)
Pt discharged at 1130 today. Right before discharge I took pt 2 tabs of Norco. Pt only wanted one.   Put in my pocket to return to pyxis and didn't find it until end of shift.  Discussed with pharmacy and Fara Chute, RN charge.  Pharmacy instructed to make a note in chart and kaitlin RN top take tab to pharmacy

## 2017-01-22 NOTE — Discharge Summary (Signed)
Physician Discharge Summary  Patient ID: Adrian Singh MRN: [id number] DOB/AGE: 81-Jan-1935 81 y.o.  Admit date: 01/21/2017 Discharge date: 01/22/2017  Admission Diagnoses:  BPH (benign prostatic hyperplasia)  Discharge Diagnoses:  Principal Problem:   BPH (benign prostatic hyperplasia) Active Problems:   Enlarged prostate with urinary obstruction   Past Medical History:  Diagnosis Date  . Arthritis    "legs, back" (10/06/2015)  . Atrial fibrillation ([place])    x1  . CAD (coronary artery disease)    nonobstructive  . 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 Dysrhythmia   . Esophageal reflux    hx of  . History of kidney stones   . Hx of heart artery stent   . Hypertrophy of prostate without urinary obstruction and other lower urinary tract symptoms (LUTS)   . Osteoarthrosis, unspecified whether generalized or localized, unspecified site   . Stroke [facility]) 03/2014   "had a series of mini strokes; maybe 4"; denies residual on 10/06/2015  . Thrombocytopenia ([place])   . Type II diabetes mellitus ([place])    type 2  . Unspecified essential hypertension     Surgeries: Procedure(s): THULIUM LASER TURP (TRANSURETHRAL RESECTION OF PROSTATE) CAUTERIZATION OF BLADDER LESION on 01/21/2017   Consultants (if any):   Discharged Condition: Improved  Hospital Course: Adrian Singh is an 83 y.o. male who was admitted 01/21/2017 with a diagnosis of BPH (benign prostatic hyperplasia) and went to the operating room on 01/21/2017 and underwent the above named procedures.  His foley was removed on POD #1 and he voided without difficulty and was discharged home.   He was given perioperative antibiotics:  Anti-infectives    Start     Dose/Rate Route Frequency Ordered Stop   01/21/17 1400  sulfamethoxazole-trimethoprim (BACTRIM DS,SEPTRA DS) 800-160 MG per tablet 1 tablet     1 tablet Oral Every 12 hours 01/21/17 1120     01/21/17 1120  ceFAZolin (ANCEF) IVPB 2g/100 mL  premix  Status:  Discontinued     2 g 200 mL/hr over 30 Minutes Intravenous 30 min pre-op 01/21/17 1120 01/21/17 1129   01/21/17 0625  ceFAZolin (ANCEF) 2-4 GM/100ML-% IVPB    Comments:  Christell Faith   : cabinet override      01/21/17 0625 01/21/17 0740   01/21/17 0551  ceFAZolin (ANCEF) IVPB 2g/100 mL premix     2 g 200 mL/hr over 30 Minutes Intravenous 30 min pre-op 01/21/17 0551 01/21/17 0810    .  He was given sequential compression devices for DVT prophylaxis.  He benefited maximally from the hospital stay and there were no complications.    Recent vital signs:  Vitals:   01/22/17 0152 01/22/17 0530  BP: 136/65 (!) 143/67  Pulse: 72 69  Resp: 16 16  Temp: 97.9 F (36.6 C) 98.3 F (36.8 C)    Recent laboratory studies:  Lab Results  Component Value Date   HGB 11.4 (L) 01/14/2017   HGB 12.4 (L) 06/05/2016   HGB 11.0 (L) 02/29/2016   Lab Results  Component Value Date   WBC 6.1 01/14/2017   PLT 137 (L) 01/14/2017   Lab Results  Component Value Date   INR 1.45 02/29/2016   Lab Results  Component Value Date   NA 138 01/14/2017   K 4.7 01/14/2017   CL 102 01/14/2017   CO2 29 01/14/2017   BUN 24 (H) 01/14/2017   CREATININE 1.22 01/14/2017  GLUCOSE 163 (H) 01/14/2017    Discharge Medications:   Allergies as of 01/22/2017      Reactions   Soma [carisoprodol] Other (See Comments)   "did a number on me"   Doxazosin Other (See Comments)   dizziness      Medication List    STOP taking these medications   tamsulosin 0.4 MG Caps capsule Commonly known as:  FLOMAX     TAKE these medications   ALEVE 220 MG Caps Generic drug:  Naproxen Sodium Take 220 mg by mouth daily.   atorvastatin 40 MG tablet Commonly known as:  LIPITOR TAKE ONE TABLET BY MOUTH ONCE DAILY What changed:  See the new instructions.   BAYER MICROLET LANCETS lancets Use to test blood sugar once daily dx: E11.40   CONTOUR NEXT EZ MONITOR w/Device Kit USE TO TEST BLOOD SUGAR ONCE  DAILY. Dx Code E11.40   docusate sodium 250 MG capsule Commonly known as:  COLACE Take 250 mg by mouth daily.   finasteride 5 MG tablet Commonly known as:  PROSCAR TAKE ONE TABLET BY MOUTH ONCE DAILY What changed:  See the new instructions.   glimepiride 4 MG tablet Commonly known as:  AMARYL Take 1 tablet (4 mg total) by mouth 2 (two) times daily.   glucose blood test strip Commonly known as:  BAYER CONTOUR NEXT TEST USE ONE STRIP TO CHECK GLUCOSE ONCE DAILY.  Use dx code E11.40.   metFORMIN 500 MG tablet Commonly known as:  GLUCOPHAGE Take 1 tablet (500 mg total) by mouth 3 (three) times daily.   metoprolol succinate 50 MG 24 hr tablet Commonly known as:  TOPROL-XL Take 1 tablet (50 mg total) by mouth daily. Take with or immediately following a meal.   multivitamin with minerals Tabs tablet Take 1 tablet by mouth 2 (two) times a week. ONE-A-DAY FOR MEN   nitroGLYCERIN 0.4 MG SL tablet Commonly known as:  NITROSTAT Place 1 tablet (0.4 mg total) under the tongue every 5 (five) minutes as needed for chest pain.   ramipril 2.5 MG capsule Commonly known as:  ALTACE TAKE ONE CAPSULE BY MOUTH ONCE DAILY What changed:  See the new instructions.   rivaroxaban 20 MG Tabs tablet Commonly known as:  XARELTO Take 1 tablet (20 mg total) by mouth daily with supper.   traMADol 50 MG tablet Commonly known as:  ULTRAM TAKE ONE TABLET BY MOUTH THREE TIMES DAILY AS NEEDED What changed:  See the new instructions.   Vitamin D3 2000 units Tabs Take 2,000 Units by mouth daily.       Diagnostic Studies: No results found.  Disposition: 01-Home or Self Care  Discharge Instructions    Discontinue IV    Complete by:  As directed       Follow-up Information    Franchot Gallo, MD Follow up.   Specialty:  Urology Why:  If not scheduled, please call the office for an appt for 2-3 weeks with Dr. Diona Fanti. Contact information: [place]  [phone]            Signed: Malka So 01/22/2017, 6:57 AM

## 2017-01-22 NOTE — Discharge Instructions (Signed)
Prostate Laser Surgery, Care After This sheet gives you information about how to care for yourself after your procedure. Your health care provider may also give you more specific instructions. If you have problems or questions, contact your health care provider. What can I expect after the procedure? For the first few weeks after the procedure:  You will feel a need to urinate often.  You may have blood in your urine.  You may feel a sudden need to urinate. Once your urinary catheter is removed, you may have a burning feeling when you urinate, especially at the end of urination. This feeling usually passes within 3-5 days. Follow these instructions at home: Activity   Return to your normal activities as told by your health care provider. Ask your health care provider what activities are safe for you.  Do not do vigorous exercise for 1 week or as told by your health care provider.  Do not lift anything that is heavier than 10 lb (4.5 kg) until your health care provider say it is safe.  Avoid sexual activity for 4-6 weeks or as told by your health care provider.  Do not ride in a car for extended periods of time for 1 month or as told by your health care provider.  Do not drive for 24 hours if you were given a medicine to help you relax (sedative). Diet   Eat foods that are high in fiber, such as fresh fruits and vegetables, whole grains, and beans.  Drink enough fluid to keep your urine clear or pale yellow. Medicines   Take over-the-counter and prescription medicines, including stool softeners, only as told by your health care provider.  If you were prescribed an antibiotic medicine, take it as told by your health care provider. Do not stop taking the antibiotic even if you start to feel better. General instructions   If you were given elastic support stockings, wear them as told by your health care provider.  Do not strain to have a bowel movement. Straining may lead to  bleeding from the prostate and cause clots to form and cause trouble urinating.  Keep all follow-up visits as told by your health care provider. This is important. Contact a health care provider if:  You have a fever or chills.  You have spasms or pain with the urinary catheter still in place.  Once the catheter has been removed, you experience difficulty starting your stream when attempting to urinate. Get help right away if:  There is a blockage in your catheter.  Your catheter has been removed and you are suddenly unable to urinate.  Your urine smells unusually bad.  You start to have blood clots in your urine.  The blood in your urine becomes persistent or gets thick.  You develop chest pains.  You develop shortness of breath.  You develop swelling or pain in your leg. Summary  You may notice urinary symptoms for a few weeks after your procedure.  Follow instructions from your health care provider regarding activity restrictions such as lifting, exercise, and sexual activity.  Contact your health care provider if you have any unusual symptoms during your recovery. This information is not intended to replace advice given to you by your health care provider. Make sure you discuss any questions you have with your health care provider.  You may resume the Xarelto in 1 week.   Document Released: 09/03/2005 Document Revised: 04/20/2016 Document Reviewed: 04/20/2016 Elsevier Interactive Patient Education  2017 [employer].

## 2017-01-28 ENCOUNTER — Other Ambulatory Visit: Payer: Self-pay | Admitting: Urology

## 2017-01-28 DIAGNOSIS — C61 Malignant neoplasm of prostate: Secondary | ICD-10-CM

## 2017-01-31 ENCOUNTER — Other Ambulatory Visit: Payer: Self-pay | Admitting: Physician Assistant

## 2017-01-31 DIAGNOSIS — C61 Malignant neoplasm of prostate: Secondary | ICD-10-CM | POA: Diagnosis not present

## 2017-02-05 ENCOUNTER — Encounter (HOSPITAL_COMMUNITY)
Admission: RE | Admit: 2017-02-05 | Discharge: 2017-02-05 | Disposition: A | Discharge status: Home / Self Care | Payer: Medicare Other | Source: Ambulatory Visit | Attending: Urology | Admitting: Urology

## 2017-02-05 ENCOUNTER — Encounter (HOSPITAL_COMMUNITY): Payer: Medicare Other

## 2017-02-05 DIAGNOSIS — C61 Malignant neoplasm of prostate: Secondary | ICD-10-CM

## 2017-02-05 DIAGNOSIS — R109 Unspecified abdominal pain: Secondary | ICD-10-CM | POA: Diagnosis not present

## 2017-02-05 MED ORDER — TECHNETIUM TC 99M MEDRONATE IV KIT: 25.0000 | PACK | Freq: Once | INTRAVENOUS | Status: DC | PRN

## 2017-02-06 ENCOUNTER — Telehealth: Payer: Self-pay | Admitting: Cardiology

## 2017-02-06 NOTE — Telephone Encounter (Signed)
New message    Patient calling the office for samples of medication:   1.  What medication and dosage are you requesting samples for? Xarelto 20 mg  2.  Are you currently out of this medication? No, pt states he has about 5 left.

## 2017-02-06 NOTE — Telephone Encounter (Signed)
Medication samples have been provided to the patient.  Drug name: xarelto 20mg   Qty: 4 bottles  LOT: [id number]  Exp.Date: 11/2018  Samples left at front desk for patient pick-up. Patient notified.  Sheral Apley M 4:02 PM 02/06/2017

## 2017-02-13 ENCOUNTER — Encounter: Payer: Self-pay | Admitting: Internal Medicine

## 2017-02-13 DIAGNOSIS — I700 Atherosclerosis of aorta: Secondary | ICD-10-CM | POA: Insufficient documentation

## 2017-02-13 DIAGNOSIS — C7951 Secondary malignant neoplasm of bone: Secondary | ICD-10-CM | POA: Insufficient documentation

## 2017-02-13 DIAGNOSIS — C61 Malignant neoplasm of prostate: Secondary | ICD-10-CM | POA: Insufficient documentation

## 2017-02-19 DIAGNOSIS — C61 Malignant neoplasm of prostate: Secondary | ICD-10-CM | POA: Diagnosis not present

## 2017-02-19 DIAGNOSIS — Z5111 Encounter for antineoplastic chemotherapy: Secondary | ICD-10-CM | POA: Diagnosis not present

## 2017-02-22 ENCOUNTER — Other Ambulatory Visit: Payer: Self-pay | Admitting: Internal Medicine

## 2017-02-25 NOTE — Telephone Encounter (Signed)
Approved: 30 x 0 

## 2017-02-25 NOTE — Telephone Encounter (Signed)
Last filled 07-23-16 #30 Last filled 07-23-16 Last OV 11-02-16 Next OV 05-07-17  No UDS

## 2017-02-25 NOTE — Telephone Encounter (Signed)
Left refill on voice mail at pharmacy  

## 2017-02-27 DIAGNOSIS — C61 Malignant neoplasm of prostate: Secondary | ICD-10-CM | POA: Diagnosis not present

## 2017-03-01 ENCOUNTER — Other Ambulatory Visit: Payer: Self-pay | Admitting: Internal Medicine

## 2017-03-01 DIAGNOSIS — E1140 Type 2 diabetes mellitus with diabetic neuropathy, unspecified: Secondary | ICD-10-CM | POA: Diagnosis not present

## 2017-03-05 ENCOUNTER — Other Ambulatory Visit: Payer: Self-pay | Admitting: *Deleted

## 2017-03-05 MED ORDER — ATORVASTATIN CALCIUM 40 MG PO TABS: ORAL_TABLET | ORAL | 5 refills | Status: DC

## 2017-03-13 ENCOUNTER — Other Ambulatory Visit: Payer: Self-pay | Admitting: Cardiology

## 2017-03-13 NOTE — Telephone Encounter (Signed)
Patient has been notified that we are currently out of samples for Xarelto. He stated that he will try back tomorrow.

## 2017-03-13 NOTE — Telephone Encounter (Signed)
Patient calling the office for samples of medication: ° ° °1.  What medication and dosage are you requesting samples for? Xarelto ° °2.  Are you currently out of this medication?  1 left ° ° ° °

## 2017-03-14 ENCOUNTER — Telehealth: Payer: Self-pay | Admitting: Cardiology

## 2017-03-14 NOTE — Telephone Encounter (Signed)
°  New Prob  Patient calling the office for samples of medication:   1.  What medication and dosage are you requesting samples for? rivaroxaban (XARELTO) 20 MG TABS tablet  2.  Are you currently out of this medication?  Completely out

## 2017-03-15 NOTE — Telephone Encounter (Signed)
Samples at the front desk 

## 2017-03-19 DIAGNOSIS — C61 Malignant neoplasm of prostate: Secondary | ICD-10-CM | POA: Diagnosis not present

## 2017-03-19 DIAGNOSIS — C7951 Secondary malignant neoplasm of bone: Secondary | ICD-10-CM | POA: Diagnosis not present

## 2017-03-27 NOTE — Telephone Encounter (Signed)
Medication samples have been provided to the patient.  Drug name: Xarelto 20 mg  Qty: 14 LOT: [id number]  Exp.Date: 03/20  Samples left at front desk for patient pick-up. Patient notified.

## 2017-03-27 NOTE — Telephone Encounter (Signed)
New message   Patient calling the office for samples of medication:   1.  What medication and dosage are you requesting samples for? Xarelto 20 mg tabs   2.  Are you currently out of this medication?  Has 3 left

## 2017-04-05 NOTE — Progress Notes (Signed)
Adrian Singh Date of Birth: 1933/10/11   History of Present Illness: Adrian Singh is seen today for follow up.  He has a history of nonobstructive coronary disease by cardiac catheterization 2009. He also has a history of orthostatic dizziness. He was admitted in July 0762 with an embolic stroke presumably due to paroxysmal Afib. He was anticoagulated with Eliquis. The patient developed tachycardia and HA on Eliquis that resolved when he was switched to Xarelto. Previous orthostatic dizziness improved with stopping diuretic and alpha blockers he was taking for enlarged prostate.  He was seen in the office on 08/10/2015 with shortness of breath with exertion. He had outpatient stress test done on 08/25/2015 which showed EF mildly decreased 45-54%, no significant ST elevation or depression during stress portion, overall considered low risk study, however has prior myocardial infarction with peri-infarct ischemia. The peri-infarct ischemia was felt to be new compared to the previous stress test, therefore outpatient cardiac catheterization was arranged. He underwent planned study on 09/22/2015 which showed 30% mid LAD lesion, 99% mid to distal left circumflex lesion, 50% OM 3 lesion. Due to poor support from right radial approach and concern for renal function, it was planned for staged PCI at a later time. He was admitted to [facility] on 10/06/15 for staged PCI/DES to mid LCx lesion that wa 99% stenosed. There was residual 30% mid LAD stenosis to be treated medically.  He was admitted in June 2017 with chest pain. Cardiac cath showed no new disease and prior stent was patent. Later when seen as an outpatient he developed orthostatic dizziness. maxzide was stopped and metoprolol was reduced with improvement.  When last seen he reported marked worsening of orthostatic dizziness. Marland Kitchen He was back on Maxide and also taking Flomax for obstructive urinary symptoms. We stopped both of these medications and referred him to  urology. He underwent laser TURP on 10/24/31 without complication.   On follow up today he reports after his surgery he lost his appetite and lost weight. Appetite is coming back. He is passing his water well. No dizziness, syncope, chest pain, or SOB. Feels well now.  Current Outpatient Prescriptions on File Prior to Visit  Medication Sig Dispense Refill  . atorvastatin (LIPITOR) 40 MG tablet TAKE ONE TABLET BY MOUTH ONCE DAILY IN THE EVENING 30 tablet 5  . BAYER MICROLET LANCETS lancets Use to test blood sugar once daily dx: E11.40 100 each 1  . Blood Glucose Monitoring Suppl (CONTOUR NEXT EZ MONITOR) w/Device KIT USE TO TEST BLOOD SUGAR ONCE DAILY. Dx Code E11.40 1 kit 0  . Cholecalciferol (VITAMIN D3) 2000 units TABS Take 2,000 Units by mouth daily.    Marland Kitchen docusate sodium (COLACE) 250 MG capsule Take 250 mg by mouth daily.    Marland Kitchen glimepiride (AMARYL) 4 MG tablet Take 1 tablet (4 mg total) by mouth 2 (two) times daily. 180 tablet 1  . glucose blood (BAYER CONTOUR NEXT TEST) test strip USE ONE STRIP TO CHECK GLUCOSE ONCE DAILY.  Use dx code E11.40. 100 each 3  . metFORMIN (GLUCOPHAGE) 500 MG tablet Take 1 tablet (500 mg total) by mouth 3 (three) times daily. 270 tablet 3  . metoprolol succinate (TOPROL-XL) 100 MG 24 hr tablet TAKE ONE TABLET BY MOUTH ONCE DAILY TAKE  WITH  OR  IMMEDIATELY  FOLLOWING  A  MEAL 90 tablet 0  . Multiple Vitamin (MULTIVITAMIN WITH MINERALS) TABS tablet Take 1 tablet by mouth 2 (two) times a week. ONE-A-DAY FOR MEN    .  Naproxen Sodium (ALEVE) 220 MG CAPS Take 220 mg by mouth daily.     . nitroGLYCERIN (NITROSTAT) 0.4 MG SL tablet Place 1 tablet (0.4 mg total) under the tongue every 5 (five) minutes as needed for chest pain. 25 tablet 3  . ramipril (ALTACE) 2.5 MG capsule TAKE ONE CAPSULE BY MOUTH ONCE DAILY 90 capsule 1  . rivaroxaban (XARELTO) 20 MG TABS tablet Take 1 tablet (20 mg total) by mouth daily with supper. 30 tablet 6  . traMADol (ULTRAM) 50 MG tablet TAKE ONE  TABLET BY MOUTH THREE TIMES DAILY AS NEEDED 30 tablet 0   No current facility-administered medications on file prior to visit.     Allergies  Allergen Reactions  . Soma [Carisoprodol] Other (See Comments)    "did a number on me"  . Doxazosin Other (See Comments)    dizziness    Past Medical History:  Diagnosis Date  . Arthritis    "legs, back" (10/06/2015)  . Atrial fibrillation ([place])    x1  . CAD (coronary artery disease)    nonobstructive  . 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 Dysrhythmia   . Esophageal reflux    hx of  . History of kidney stones   . Hx of heart artery stent   . Hypertrophy of prostate without urinary obstruction and other lower urinary tract symptoms (LUTS)   . Osteoarthrosis, unspecified whether generalized or localized, unspecified site   . Stroke [facility]) 03/2014   "had a series of mini strokes; maybe 4"; denies residual on 10/06/2015  . Thrombocytopenia ([place])   . Type II diabetes mellitus ([place])    type 2  . Unspecified essential hypertension     Past Surgical History:  Procedure Laterality Date  . BACK SURGERY     2001  . CARDIAC CATHETERIZATION  12/2007  . CARDIAC CATHETERIZATION N/A 09/22/2015   Procedure: Left Heart Cath and Coronary Angiography;  Surgeon: Les Longmore M Martinique, MD;  Location: [place] CV [facility];  Service: Cardiovascular;  Laterality: N/A;  . CARDIAC CATHETERIZATION N/A 10/06/2015   Procedure: Coronary Stent Intervention;  Surgeon: Aneesah Hernan M Martinique, MD;  Location: [place] [facility];  Service: Cardiovascular;  Laterality: N/A;  . CARDIAC CATHETERIZATION N/A 02/29/2016   Procedure: Left Heart Cath and Coronary Angiography;  Surgeon: Jettie Booze, MD;  Location: [place] [facility];  Service: Cardiovascular;  Laterality: N/A;  . COLONOSCOPY W/ BIOPSIES AND POLYPECTOMY    . CORONARY STENT PLACEMENT  10/06/2015   [name]  with DES  . EAR CYST EXCISION N/A 04/06/2015   Procedure: EXCISION OF SCALP CYST;   Surgeon: Donnie Mesa, MD;  Location: [place];  Service: General;  Laterality: N/A;  . ESOPHAGOGASTRODUODENOSCOPY (EGD) WITH ESOPHAGEAL DILATION  2001   with dilation  . LUMBAR DISC SURGERY  09/26/1999   "cleaned out arthritis and bone spurs"  . SHOULDER ARTHROSCOPY W/ ROTATOR CUFF REPAIR Left 2005  . THULIUM LASER TURP (TRANSURETHRAL RESECTION OF PROSTATE) N/A 01/21/2017   Procedure: Marcelino Duster LASER TURP (TRANSURETHRAL RESECTION OF PROSTATE) CAUTERIZATION OF BLADDER LESION;  Surgeon: Franchot Gallo, MD;  Location: [facility];  Service: Urology;  Laterality: N/A;    History  Smoking Status  . Former Smoker  . Years: 3.00  . Types: Cigarettes  Smokeless Tobacco  . Never Used    Comment: " Quit smoking by age 74; was a someday smoker "    History  Alcohol Use  No    Family History  Problem Relation Age of Onset  . Stroke Father   . Peripheral vascular disease Father        amputation  . Heart failure Mother        CHF  . Coronary artery disease Mother   . Heart attack Mother 91       Multiple  . COPD Brother   . Heart disease Brother   . Cancer Brother        Bone  . Lung cancer Sister     Review of Systems: The review of systems is as noted in HPI.  All other systems were reviewed and are negative.  Physical Exam: BP 126/72   Pulse 78   Ht _0  (1.778 m)   Wt 198 lb 11.2 oz (90.1 kg)   SpO2 96%   BMI 28.51 kg/m   He is a pleasant white male in no acute distress.  The HEENT exam is normal.  The carotids are 2+ without bruits.  There is no thyromegaly.  There is no JVD.  The lungs are clear.    The heart exam reveals a regular rate with a normal S1 and S2.  There are no murmurs, gallops, or rubs.  The PMI is not displaced.   Abdominal exam reveals good bowel sounds.    There is no hepatosplenomegaly or tenderness.  There are no masses.  Exam of the legs reveal no edema. The distal pulses are intact.  Cranial nerves II - XII are intact.  Motor and sensory functions are  intact.  The gait is normal.  LABORATORY DATA: Lab Results  Component Value Date   WBC 6.1 01/14/2017   HGB 11.4 (L) 01/14/2017   HCT 36.1 (L) 01/14/2017   PLT 137 (L) 01/14/2017   GLUCOSE 163 (H) 01/14/2017   CHOL 73 04/02/2016   TRIG 125.0 04/02/2016   HDL 29.30 (L) 04/02/2016   LDLDIRECT 77.0 03/28/2015   LDLCALC 18 04/02/2016   ALT 36 06/05/2016   AST 32 06/05/2016   NA 138 01/14/2017   K 4.7 01/14/2017   CL 102 01/14/2017   CREATININE 1.22 01/14/2017   BUN 24 (H) 01/14/2017   CO2 29 01/14/2017   TSH 3.33 08/26/2013   PSA 158.00 (H) 01/14/2017   INR 1.45 02/29/2016   HGBA1C 8.2 (H) 01/14/2017   MICROALBUR 17.2 (H) 05/23/2012    Assessment / Plan: 1. Orthostatic hypotension. This has been a chronic problem- ? Dysautonomia due to DM. It is exacerbated by medication- especially diuretics and Flomax. Resolved after stopping these meds. Now s/p successful TURP.    2. CAD s/p DES of mid LCx in January 2017. No recurrent angina. Repeat cardiac cath in June 2017 showed continued patency. Off antiplatelet therapy. Continue Xarelto.  3. Hypertension controlled. See #1.   4. CKD stage 3.   5. Paroxysmal Afib. Continue metoprolol and Xarelto.   6. History of CVA.  7. DM per  Dr Silvio Pate. Control improved with recent weight loss.  I will follow up in 6 months.

## 2017-04-11 ENCOUNTER — Encounter: Payer: Self-pay | Admitting: Cardiology

## 2017-04-11 ENCOUNTER — Ambulatory Visit (INDEPENDENT_AMBULATORY_CARE_PROVIDER_SITE_OTHER): Payer: Medicare Other | Admitting: Cardiology

## 2017-04-11 VITALS — BP 126/72 | HR 78 | Ht 70.0 in | Wt 198.7 lb

## 2017-04-11 DIAGNOSIS — I951 Orthostatic hypotension: Secondary | ICD-10-CM | POA: Diagnosis not present

## 2017-04-11 DIAGNOSIS — Z9861 Coronary angioplasty status: Secondary | ICD-10-CM

## 2017-04-11 DIAGNOSIS — I2510 Atherosclerotic heart disease of native coronary artery without angina pectoris: Secondary | ICD-10-CM

## 2017-04-11 DIAGNOSIS — I480 Paroxysmal atrial fibrillation: Secondary | ICD-10-CM | POA: Diagnosis not present

## 2017-04-11 DIAGNOSIS — I10 Essential (primary) hypertension: Secondary | ICD-10-CM | POA: Diagnosis not present

## 2017-04-11 NOTE — Patient Instructions (Signed)
Continue your current therapy  I will see you in 6 months.   

## 2017-04-23 DIAGNOSIS — C7951 Secondary malignant neoplasm of bone: Secondary | ICD-10-CM | POA: Diagnosis not present

## 2017-04-23 DIAGNOSIS — C61 Malignant neoplasm of prostate: Secondary | ICD-10-CM | POA: Diagnosis not present

## 2017-05-02 DIAGNOSIS — E1140 Type 2 diabetes mellitus with diabetic neuropathy, unspecified: Secondary | ICD-10-CM | POA: Diagnosis not present

## 2017-05-07 ENCOUNTER — Ambulatory Visit (INDEPENDENT_AMBULATORY_CARE_PROVIDER_SITE_OTHER): Payer: Medicare Other | Admitting: Internal Medicine

## 2017-05-07 ENCOUNTER — Encounter: Payer: Self-pay | Admitting: Internal Medicine

## 2017-05-07 VITALS — BP 126/64 | HR 72 | Temp 97.4°F | Ht 70.0 in | Wt 194.4 lb

## 2017-05-07 DIAGNOSIS — C61 Malignant neoplasm of prostate: Secondary | ICD-10-CM

## 2017-05-07 DIAGNOSIS — I480 Paroxysmal atrial fibrillation: Secondary | ICD-10-CM | POA: Diagnosis not present

## 2017-05-07 DIAGNOSIS — E1165 Type 2 diabetes mellitus with hyperglycemia: Secondary | ICD-10-CM

## 2017-05-07 DIAGNOSIS — E1140 Type 2 diabetes mellitus with diabetic neuropathy, unspecified: Secondary | ICD-10-CM

## 2017-05-07 DIAGNOSIS — C7951 Secondary malignant neoplasm of bone: Secondary | ICD-10-CM | POA: Diagnosis not present

## 2017-05-07 DIAGNOSIS — IMO0002 Reserved for concepts with insufficient information to code with codable children: Secondary | ICD-10-CM

## 2017-05-07 LAB — HEMOGLOBIN A1C: Hgb A1c MFr Bld: 8.1 % — ABNORMAL HIGH (ref 4.6–6.5)

## 2017-05-07 LAB — LIPID PANEL
Cholesterol: 59 mg/dL (ref 0–200)
HDL: 31.1 mg/dL — ABNORMAL LOW (ref >39.00)
LDL Cholesterol: 8 mg/dL (ref 0–99)
NonHDL: 27.84
Total CHOL/HDL Ratio: 2
Triglycerides: 100 mg/dL (ref 0.0–149.0)
VLDL: 20 mg/dL (ref 0.0–40.0)

## 2017-05-07 LAB — HM DIABETES FOOT EXAM

## 2017-05-07 NOTE — Progress Notes (Signed)
Subjective:    Patient ID: Adrian Singh, male    DOB: 14-Apr-1934, 83 y.o.   MRN: [id number]  HPI Here for follow up of diabetes and other chronic health conditions  Recent diagnosis of metastatic prostate cancer Found on pathology of TURP Then with high PSA--- bone mets found Voids fairly well now  On androgen deprivation and other Rx Some fatigue but tries to exercise Busy keeping up household---wife with recent disk surgery (and has needed care)  Sugar mostly better Often under 100 Highest around 150 and average 125 (except once at 180) Rare low sugar reactions--just had to eat something  No chest pain but had some left rib pain recently Likely from the cancer Rare skipping feeling in heart--nothing regular No edema  Current Outpatient Prescriptions on File Prior to Visit  Medication Sig Dispense Refill  . atorvastatin (LIPITOR) 40 MG tablet TAKE ONE TABLET BY MOUTH ONCE DAILY IN THE EVENING 30 tablet 5  . BAYER MICROLET LANCETS lancets Use to test blood sugar once daily dx: E11.40 100 each 1  . Blood Glucose Monitoring Suppl (CONTOUR NEXT EZ MONITOR) w/Device KIT USE TO TEST BLOOD SUGAR ONCE DAILY. Dx Code E11.40 1 kit 0  . Cholecalciferol (VITAMIN D3) 2000 units TABS Take 2,000 Units by mouth daily.    Marland Kitchen docusate sodium (COLACE) 250 MG capsule Take 250 mg by mouth daily.    Marland Kitchen glimepiride (AMARYL) 4 MG tablet Take 1 tablet (4 mg total) by mouth 2 (two) times daily. 180 tablet 1  . glucose blood (BAYER CONTOUR NEXT TEST) test strip USE ONE STRIP TO CHECK GLUCOSE ONCE DAILY.  Use dx code E11.40. 100 each 3  . metFORMIN (GLUCOPHAGE) 500 MG tablet Take 1 tablet (500 mg total) by mouth 3 (three) times daily. 270 tablet 3  . metoprolol succinate (TOPROL-XL) 100 MG 24 hr tablet TAKE ONE TABLET BY MOUTH ONCE DAILY TAKE  WITH  OR  IMMEDIATELY  FOLLOWING  A  MEAL 90 tablet 0  . Multiple Vitamin (MULTIVITAMIN WITH MINERALS) TABS tablet Take 1 tablet by mouth 2 (two) times a week.  ONE-A-DAY FOR MEN    . Naproxen Sodium (ALEVE) 220 MG CAPS Take 220 mg by mouth daily.     . nitroGLYCERIN (NITROSTAT) 0.4 MG SL tablet Place 1 tablet (0.4 mg total) under the tongue every 5 (five) minutes as needed for chest pain. 25 tablet 3  . ramipril (ALTACE) 2.5 MG capsule TAKE ONE CAPSULE BY MOUTH ONCE DAILY 90 capsule 1  . rivaroxaban (XARELTO) 20 MG TABS tablet Take 1 tablet (20 mg total) by mouth daily with supper. 30 tablet 6  . traMADol (ULTRAM) 50 MG tablet TAKE ONE TABLET BY MOUTH THREE TIMES DAILY AS NEEDED 30 tablet 0   No current facility-administered medications on file prior to visit.     Allergies  Allergen Reactions  . Soma [Carisoprodol] Other (See Comments)    "did a number on me"  . Doxazosin Other (See Comments)    dizziness    Past Medical History:  Diagnosis Date  . Arthritis    "legs, back" (10/06/2015)  . Atrial fibrillation ([place])    x1  . CAD (coronary artery disease)    nonobstructive  . Cancer ([place])    skin cancer on ear (froze it off) and back (cut it off)  . Dysrhythmia   . Esophageal reflux    hx of  . History of kidney stones   . Hx of heart artery stent   .  Hypertrophy of prostate without urinary obstruction and other lower urinary tract symptoms (LUTS)   . Osteoarthrosis, unspecified whether generalized or localized, unspecified site   . Stroke [facility]) 03/2014   "had a series of mini strokes; maybe 4"; denies residual on 10/06/2015  . Thrombocytopenia ([place])   . Type II diabetes mellitus ([place])    type 2  . Unspecified essential hypertension     Past Surgical History:  Procedure Laterality Date  . BACK SURGERY     2001  . CARDIAC CATHETERIZATION  12/2007  . CARDIAC CATHETERIZATION N/A 09/22/2015   Procedure: Left Heart Cath and Coronary Angiography;  Surgeon: Peter M Martinique, MD;  Location: [place] [facility];  Service: Cardiovascular;  Laterality: N/A;  . CARDIAC CATHETERIZATION N/A 10/06/2015   Procedure: Coronary Stent Intervention;   Surgeon: Peter M Martinique, MD;  Location: [place] [facility];  Service: Cardiovascular;  Laterality: N/A;  . CARDIAC CATHETERIZATION N/A 02/29/2016   Procedure: Left Heart Cath and Coronary Angiography;  Surgeon: Jettie Booze, MD;  Location: [place] [facility];  Service: Cardiovascular;  Laterality: N/A;  . COLONOSCOPY W/ BIOPSIES AND POLYPECTOMY    . CORONARY STENT PLACEMENT  10/06/2015   [name]  with DES  . EAR CYST EXCISION N/A 04/06/2015   Procedure: EXCISION OF SCALP CYST;  Surgeon: Donnie Mesa, MD;  Location: [place];  Service: General;  Laterality: N/A;  . ESOPHAGOGASTRODUODENOSCOPY (EGD) WITH ESOPHAGEAL DILATION  2001   with dilation  . LUMBAR DISC SURGERY  09/26/1999   "cleaned out arthritis and bone spurs"  . SHOULDER ARTHROSCOPY W/ ROTATOR CUFF REPAIR Left 2005  . THULIUM LASER TURP (TRANSURETHRAL RESECTION OF PROSTATE) N/A 01/21/2017   Procedure: Marcelino Duster LASER TURP (TRANSURETHRAL RESECTION OF PROSTATE) CAUTERIZATION OF BLADDER LESION;  Surgeon: Franchot Gallo, MD;  Location[facility];  Service: Urology;  Laterality: N/A;    Family History  Problem Relation Age of Onset  . Stroke Father   . Peripheral vascular disease Father        amputation  . Heart failure Mother        CHF  . Coronary artery disease Mother   . Heart attack Mother 47       Multiple  . COPD Brother   . Heart disease Brother   . Cancer Brother        Bone  . Lung cancer Sister     Social History   Social History  . Marital status: Married    Spouse name: N/A  . Number of children: 4  . Years of education: N/A   Occupational History  . 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 Smoking status: Former Smoker    Years: 3.00    Types: Cigarettes  . Smokeless tobacco: Never Used     Comment: " Quit smoking by age 30; was a someday smoker "  . Alcohol use No  . Drug use: No  . Sexual activity: No   Other Topics Concern  . Not on file   Social History Narrative     No living will   Plans wife and then children to make health care decisions for him if unable   Would request at least attempts at resuscitation but no prolonged life support   Doesn't think he would want tube feeds if cognitively unaware    Review of Systems Has lost almost 15# since the prostate surgery Right occipital lump is better now (since on cancer  Rx) Sleeps okay--except for getting up often for his wife. Naps in day as needed    Objective:   Physical Exam  Constitutional: He appears well-nourished. No distress.  Neck: No thyromegaly present.  Cardiovascular: Normal rate, regular rhythm, normal heart sounds and intact distal pulses.  Exam reveals no gallop.   No murmur heard. Pulmonary/Chest: Effort normal and breath sounds normal. No respiratory distress. He has no wheezes. He has no rales.  Musculoskeletal: He exhibits no edema or tenderness.  Lymphadenopathy:    He has no cervical adenopathy.  Neurological:  Decreased sensation in feet  Skin:  Right plantar callous without ulcer No other lesions  Psychiatric: He has a normal mood and affect. His behavior is normal.          Assessment & Plan:

## 2017-05-07 NOTE — Assessment & Plan Note (Signed)
Scattered bony pain Will have repeat PSA later in year--hopefully down

## 2017-05-07 NOTE — Assessment & Plan Note (Signed)
Getting Rx from urologist

## 2017-05-07 NOTE — Assessment & Plan Note (Signed)
Regular now Continues on xarelto

## 2017-05-07 NOTE — Assessment & Plan Note (Signed)
This is probably better Cautioned to let me know if he has any significant hypoglycemia Numbness in feet--but no sig pain (no meds needed)

## 2017-05-08 ENCOUNTER — Telehealth: Payer: Self-pay | Admitting: Cardiology

## 2017-05-08 NOTE — Telephone Encounter (Signed)
New message ° ° ° ° ° °Patient calling the office for samples of medication: ° ° °1.  What medication and dosage are you requesting samples for? xarelto 20mg °2.  Are you currently out of this medication? Almost out ° ° °

## 2017-05-08 NOTE — Telephone Encounter (Signed)
Samples at the front desk Pt notified 

## 2017-05-21 DIAGNOSIS — C7951 Secondary malignant neoplasm of bone: Secondary | ICD-10-CM | POA: Diagnosis not present

## 2017-05-21 DIAGNOSIS — C61 Malignant neoplasm of prostate: Secondary | ICD-10-CM | POA: Diagnosis not present

## 2017-05-23 DIAGNOSIS — R310 Gross hematuria: Secondary | ICD-10-CM | POA: Diagnosis not present

## 2017-05-29 ENCOUNTER — Other Ambulatory Visit: Payer: Self-pay | Admitting: Internal Medicine

## 2017-06-10 ENCOUNTER — Ambulatory Visit (INDEPENDENT_AMBULATORY_CARE_PROVIDER_SITE_OTHER): Payer: Medicare Other

## 2017-06-10 DIAGNOSIS — Z23 Encounter for immunization: Secondary | ICD-10-CM

## 2017-06-11 ENCOUNTER — Telehealth: Payer: Self-pay | Admitting: Cardiology

## 2017-06-11 MED ORDER — RIVAROXABAN 20 MG PO TABS: 20.0000 mg | ORAL_TABLET | Freq: Every day | ORAL | 6 refills | Status: DC

## 2017-06-11 NOTE — Telephone Encounter (Signed)
New Message ° °Patient calling the office for samples of medication: ° ° °1.  What medication and dosage are you requesting samples for? Xarelto 20mg ° °2.  Are you currently out of this medication? yes ° ° ° ° °

## 2017-06-11 NOTE — Telephone Encounter (Signed)
Returned call to patient office out of Xarelto 20 mg samples.

## 2017-06-11 NOTE — Telephone Encounter (Signed)
New message    Pt is calling to follow up about samples. Please call.

## 2017-06-11 NOTE — Telephone Encounter (Signed)
Returned call to patient office still out of Xarelto 20 mg samples.Prescription sent to pharmacy.

## 2017-06-13 ENCOUNTER — Telehealth: Payer: Self-pay

## 2017-06-13 NOTE — Telephone Encounter (Signed)
Left message on personal voice mail Xarelto 20 mg samples left at [facility] office front desk.

## 2017-06-18 ENCOUNTER — Other Ambulatory Visit: Payer: Self-pay | Admitting: Internal Medicine

## 2017-06-18 DIAGNOSIS — E1140 Type 2 diabetes mellitus with diabetic neuropathy, unspecified: Secondary | ICD-10-CM | POA: Diagnosis not present

## 2017-06-18 DIAGNOSIS — C61 Malignant neoplasm of prostate: Secondary | ICD-10-CM | POA: Diagnosis not present

## 2017-06-18 NOTE — Telephone Encounter (Signed)
Pt spoke with Morey Hummingbird and waiting at [address]  To pick up test strips that pharmacy has requested. Refilled per protocol. Morey Hummingbird will let pt know.

## 2017-06-24 DIAGNOSIS — C7951 Secondary malignant neoplasm of bone: Secondary | ICD-10-CM | POA: Diagnosis not present

## 2017-06-24 DIAGNOSIS — C61 Malignant neoplasm of prostate: Secondary | ICD-10-CM | POA: Diagnosis not present

## 2017-07-18 ENCOUNTER — Telehealth: Payer: Self-pay | Admitting: Cardiology

## 2017-07-18 NOTE — Telephone Encounter (Signed)
Patient calling the office for samples of medication:   1.  What medication and dosage are you requesting samples for? Xarelto  2.  Are you currently out of this medication?

## 2017-07-22 NOTE — Telephone Encounter (Signed)
Follow UP: Adrian Singh is calling to check on the samples of (Xarelto)  he requested on 07/18/17. Please call

## 2017-07-22 NOTE — Telephone Encounter (Signed)
Medication samples have been provided to the patient.  Drug name: Xarelto 20mg   Qty: 14 tablets  LOT: [id number]  Exp.Date: 03/21  Samples left at front desk for patient pick-up. Patient notified.

## 2017-07-24 DIAGNOSIS — C61 Malignant neoplasm of prostate: Secondary | ICD-10-CM | POA: Diagnosis not present

## 2017-07-24 DIAGNOSIS — C7951 Secondary malignant neoplasm of bone: Secondary | ICD-10-CM | POA: Diagnosis not present

## 2017-07-31 ENCOUNTER — Telehealth: Payer: Self-pay | Admitting: Cardiology

## 2017-07-31 ENCOUNTER — Other Ambulatory Visit: Payer: Self-pay | Admitting: Internal Medicine

## 2017-07-31 ENCOUNTER — Other Ambulatory Visit: Payer: Self-pay | Admitting: Cardiology

## 2017-07-31 NOTE — Telephone Encounter (Signed)
Left refill on voice mail at pharmacy  

## 2017-07-31 NOTE — Telephone Encounter (Signed)
Approved: 30 x 0 

## 2017-07-31 NOTE — Telephone Encounter (Signed)
Returned call to patient left message on personal voice mail Xarelto 20 mg samples left at front desk.

## 2017-07-31 NOTE — Telephone Encounter (Signed)
close

## 2017-07-31 NOTE — Telephone Encounter (Signed)
Patient calling the office for samples of medication:   1.  What medication and dosage are you requesting samples for? Xarelto 20 mg  2.  Are you currently out of this medication? No, running low

## 2017-07-31 NOTE — Telephone Encounter (Signed)
Last filled 02-25-17 #30 Last OV 05-07-17 Next OV 11-14-17

## 2017-08-23 ENCOUNTER — Telehealth: Payer: Self-pay | Admitting: Cardiology

## 2017-08-23 NOTE — Telephone Encounter (Signed)
No samples available left detailed message, as we have not had any available this week and for pt to get filled at pharmacy, and this medication should not be stopped at any time.

## 2017-08-23 NOTE — Telephone Encounter (Signed)
New Message      Patient calling the office for samples of medication:   1.  What medication and dosage are you requesting samples for?  Xarelto 20mg   2.  Are you currently out of this medication?  Has a couple left

## 2017-09-06 DIAGNOSIS — C7951 Secondary malignant neoplasm of bone: Secondary | ICD-10-CM | POA: Diagnosis not present

## 2017-09-06 DIAGNOSIS — Z5111 Encounter for antineoplastic chemotherapy: Secondary | ICD-10-CM | POA: Diagnosis not present

## 2017-09-06 DIAGNOSIS — C61 Malignant neoplasm of prostate: Secondary | ICD-10-CM | POA: Diagnosis not present

## 2017-09-27 ENCOUNTER — Telehealth: Payer: Self-pay | Admitting: Cardiology

## 2017-09-27 DIAGNOSIS — E1140 Type 2 diabetes mellitus with diabetic neuropathy, unspecified: Secondary | ICD-10-CM | POA: Diagnosis not present

## 2017-09-27 NOTE — Telephone Encounter (Signed)
Returned call to patient.Xarelto 20 mg samples left at front desk. 

## 2017-09-27 NOTE — Telephone Encounter (Signed)
New message     Patient calling the office for samples of medication:   1.  What medication and dosage are you requesting samples for? xarelto 20 mg  2.  Are you currently out of this medication?  Only has 4 left

## 2017-10-08 DIAGNOSIS — C61 Malignant neoplasm of prostate: Secondary | ICD-10-CM | POA: Diagnosis not present

## 2017-10-08 DIAGNOSIS — C7951 Secondary malignant neoplasm of bone: Secondary | ICD-10-CM | POA: Diagnosis not present

## 2017-10-11 ENCOUNTER — Other Ambulatory Visit: Payer: Self-pay | Admitting: Internal Medicine

## 2017-10-14 NOTE — Telephone Encounter (Signed)
Last filled 08-01-17 #30  Last OV 05-07-17 Next OV 11-14-17

## 2017-10-18 ENCOUNTER — Telehealth: Payer: Self-pay | Admitting: Cardiology

## 2017-10-18 NOTE — Telephone Encounter (Signed)
Medication samples have been provided to the patient.  Drug name: Xarelto 20 mg   Qty: 14    LOT: [id number]  Exp.Date: 03/21  Samples left at front desk for patient pick-up.  Left VM for patient to make aware.

## 2017-10-18 NOTE — Telephone Encounter (Signed)
New message    Patient calling the office for samples of medication:   1.  What medication and dosage are you requesting samples for?rivaroxaban (XARELTO) 20 MG TABS tablet  2.  Are you currently out of this medication? 3 remaining

## 2017-10-24 ENCOUNTER — Ambulatory Visit: Payer: Medicare Other | Admitting: Cardiology

## 2017-11-04 ENCOUNTER — Telehealth: Payer: Self-pay | Admitting: Cardiology

## 2017-11-04 NOTE — Telephone Encounter (Signed)
Patient aware samples are at the front desk for pick up  

## 2017-11-04 NOTE — Telephone Encounter (Signed)
New Message   Patient calling the office for samples of medication:   1.  What medication and dosage are you requesting samples for? Xarelto 20mg  once a day   2.  Are you currently out of this medication?  1 remaining

## 2017-11-05 NOTE — Progress Notes (Deleted)
Adrian Singh Date of Birth: 1934/05/21   History of Present Illness: Adrian Singh is seen today for follow up.  He has a history of nonobstructive coronary disease by cardiac catheterization 2009. He also has a history of orthostatic dizziness. He was admitted in July 0109 with an embolic stroke presumably due to paroxysmal Afib. He was anticoagulated with Eliquis. The patient developed tachycardia and HA on Eliquis that resolved when he was switched to Xarelto. Previous orthostatic dizziness improved with stopping diuretic and alpha blockers he was taking for enlarged prostate.  He was seen in the office on 08/10/2015 with shortness of breath with exertion. He had outpatient stress test done on 08/25/2015 which showed EF mildly decreased 45-54%, no significant ST elevation or depression during stress portion, overall considered low risk study, however has prior myocardial infarction with peri-infarct ischemia. The peri-infarct ischemia was felt to be new compared to the previous stress test, therefore outpatient cardiac catheterization was arranged. He underwent planned study on 09/22/2015 which showed 30% mid LAD lesion, 99% mid to distal left circumflex lesion, 50% OM 3 lesion. Due to poor support from right radial approach and concern for renal function, it was planned for staged PCI at a later time. He was admitted to [facility] on 10/06/15 for staged PCI/DES to mid LCx lesion that wa 99% stenosed. There was residual 30% mid LAD stenosis to be treated medically.  He was admitted in June 2017 with chest pain. Cardiac cath showed no new disease and prior stent was patent. Later when seen as an outpatient he developed orthostatic dizziness. maxzide was stopped and metoprolol was reduced with improvement.  When seen last year he reported marked worsening of orthostatic dizziness. Marland Kitchen He was back on Maxide and also taking Flomax for obstructive urinary symptoms. We stopped both of these medications and referred  him to urology. He underwent laser TURP on 11/16/33 without complication.   On follow up today he reports after his surgery he lost his appetite and lost weight. Appetite is coming back. He is passing his water well. No dizziness, syncope, chest pain, or SOB. Feels well now.  Current Outpatient Medications on File Prior to Visit  Medication Sig Dispense Refill  . atorvastatin (LIPITOR) 40 MG tablet TAKE ONE TABLET BY MOUTH ONCE DAILY IN THE EVENING 30 tablet 5  . BAYER MICROLET LANCETS lancets Use to test blood sugar once daily dx: E11.40 100 each 1  . Blood Glucose Monitoring Suppl (CONTOUR NEXT EZ MONITOR) w/Device KIT USE TO TEST BLOOD SUGAR ONCE DAILY. Dx Code E11.40 1 kit 0  . Cholecalciferol (VITAMIN D3) 2000 units TABS Take 2,000 Units by mouth daily.    . CONTOUR NEXT TEST test strip USE ONE STRIP TO CHECK GLUCOSE ONCE DAILY 100 each 1  . docusate sodium (COLACE) 250 MG capsule Take 250 mg by mouth daily.    Marland Kitchen glimepiride (AMARYL) 4 MG tablet TAKE 1 TABLET BY MOUTH TWICE DAILY 180 tablet 1  . metFORMIN (GLUCOPHAGE) 500 MG tablet Take 1 tablet (500 mg total) by mouth 3 (three) times daily. 270 tablet 3  . metoprolol succinate (TOPROL-XL) 100 MG 24 hr tablet TAKE 1 TABLET BY MOUTH ONCE DAILY TAKE  WITH  OR  IMMEDIATELY  FOLLOWING  A  MEAL 90 tablet 0  . Multiple Vitamin (MULTIVITAMIN WITH MINERALS) TABS tablet Take 1 tablet by mouth 2 (two) times a week. ONE-A-DAY FOR MEN    . Naproxen Sodium (ALEVE) 220 MG CAPS Take 220 mg by  mouth daily.     . nitroGLYCERIN (NITROSTAT) 0.4 MG SL tablet Place 1 tablet (0.4 mg total) under the tongue every 5 (five) minutes as needed for chest pain. 25 tablet 3  . ramipril (ALTACE) 2.5 MG capsule TAKE ONE CAPSULE BY MOUTH ONCE DAILY 90 capsule 1  . rivaroxaban (XARELTO) 20 MG TABS tablet Take 1 tablet (20 mg total) by mouth daily with supper. 30 tablet 6  . traMADol (ULTRAM) 50 MG tablet TAKE 1 TABLET BY MOUTH THREE TIMES DAILY AS NEEDED 30 tablet 0   No  current facility-administered medications on file prior to visit.     Allergies  Allergen Reactions  . Soma [Carisoprodol] Other (See Comments)    "did a number on me"  . Doxazosin Other (See Comments)    dizziness    Past Medical History:  Diagnosis Date  . Arthritis    "legs, back" (10/06/2015)  . Atrial fibrillation ([place])    x1  . CAD (coronary artery disease)    nonobstructive  . Cancer ([place])    skin cancer on ear (froze it off) and back (cut it off)  . Dysrhythmia   . Esophageal reflux    hx of  . History of kidney stones   . Hx of heart artery stent   . Hypertrophy of prostate without urinary obstruction and other lower urinary tract symptoms (LUTS)   . Osteoarthrosis, unspecified whether generalized or localized, unspecified site   . Stroke [facility]) 03/2014   "had a series of mini strokes; maybe 4"; denies residual on 10/06/2015  . Thrombocytopenia ([place])   . Type II diabetes mellitus ([place])    type 2  . Unspecified essential hypertension     Past Surgical History:  Procedure Laterality Date  . BACK SURGERY     2001  . CARDIAC CATHETERIZATION  12/2007  . CARDIAC CATHETERIZATION N/A 09/22/2015   Procedure: Left Heart Cath and Coronary Angiography;  Surgeon: Kamia Insalaco M Martinique, MD;  Location: [place] [facility];  Service: Cardiovascular;  Laterality: N/A;  . CARDIAC CATHETERIZATION N/A 10/06/2015   Procedure: Coronary Stent Intervention;  Surgeon: Tabria Steines M Martinique, MD;  Location: [place] [facility];  Service: Cardiovascular;  Laterality: N/A;  . CARDIAC CATHETERIZATION N/A 02/29/2016   Procedure: Left Heart Cath and Coronary Angiography;  Surgeon: Jettie Booze, MD;  Location: [place] [facility];  Service: Cardiovascular;  Laterality: N/A;  . COLONOSCOPY W/ BIOPSIES AND POLYPECTOMY    . CORONARY STENT PLACEMENT  10/06/2015   [name]  with DES  . EAR CYST EXCISION N/A 04/06/2015   Procedure: EXCISION OF SCALP CYST;  Surgeon: Donnie Mesa, MD;  Location: [place];  Service: General;   Laterality: N/A;  . ESOPHAGOGASTRODUODENOSCOPY (EGD) WITH ESOPHAGEAL DILATION  2001   with dilation  . LUMBAR DISC SURGERY  09/26/1999   "cleaned out arthritis and bone spurs"  . SHOULDER ARTHROSCOPY W/ ROTATOR CUFF REPAIR Left 2005  . THULIUM LASER TURP (TRANSURETHRAL RESECTION OF PROSTATE) N/A 01/21/2017   Procedure: Marcelino Duster LASER TURP (TRANSURETHRAL RESECTION OF PROSTATE) CAUTERIZATION OF BLADDER LESION;  Surgeon: Franchot Gallo, MD;  Location: [facility];  Service: Urology;  Laterality: N/A;    Social History   Tobacco Use  Smoking Status Former Smoker  . Years: 3.00  . Types: Cigarettes  Smokeless Tobacco Never Used  Tobacco Comment   " Quit smoking by age 65; was a someday smoker "    Social History   Substance and Sexual Activity  Alcohol Use No  Family History  Problem Relation Age of Onset  . Stroke Father   . Peripheral vascular disease Father        amputation  . Heart failure Mother        CHF  . Coronary artery disease Mother   . Heart attack Mother 59       Multiple  . COPD Brother   . Heart disease Brother   . Cancer Brother        Bone  . Lung cancer Sister     Review of Systems: The review of systems is as noted in HPI.  All other systems were reviewed and are negative.  Physical Exam: There were no vitals taken for this visit.  He is a pleasant white male in no acute distress.  The HEENT exam is normal.  The carotids are 2+ without bruits.  There is no thyromegaly.  There is no JVD.  The lungs are clear.    The heart exam reveals a regular rate with a normal S1 and S2.  There are no murmurs, gallops, or rubs.  The PMI is not displaced.   Abdominal exam reveals good bowel sounds.    There is no hepatosplenomegaly or tenderness.  There are no masses.  Exam of the legs reveal no edema. The distal pulses are intact.  Cranial nerves II - XII are intact.  Motor and sensory functions are intact.  The gait is normal.  LABORATORY DATA: Lab Results   Component Value Date   WBC 6.1 01/14/2017   HGB 11.4 (L) 01/14/2017   HCT 36.1 (L) 01/14/2017   PLT 137 (L) 01/14/2017   GLUCOSE 163 (H) 01/14/2017   CHOL 59 05/07/2017   TRIG 100.0 05/07/2017   HDL 31.10 (L) 05/07/2017   LDLDIRECT 77.0 03/28/2015   LDLCALC 8 05/07/2017   ALT 36 06/05/2016   AST 32 06/05/2016   NA 138 01/14/2017   K 4.7 01/14/2017   CL 102 01/14/2017   CREATININE 1.22 01/14/2017   BUN 24 (H) 01/14/2017   CO2 29 01/14/2017   TSH 3.33 08/26/2013   PSA 158.00 (H) 01/14/2017   INR 1.45 02/29/2016   HGBA1C 8.1 (H) 05/07/2017   MICROALBUR 17.2 (H) 05/23/2012    Assessment / Plan: 1. Orthostatic hypotension. This has been a chronic problem- ? Dysautonomia due to DM. It is exacerbated by medication- especially diuretics and Flomax. Resolved after stopping these meds. Now s/p successful TURP.    2. CAD s/p DES of mid LCx in January 2017. No recurrent angina. Repeat cardiac cath in June 2017 showed continued patency. Off antiplatelet therapy. Continue Xarelto.  3. Hypertension controlled. See #1.   4. CKD stage 3.   5. Paroxysmal Afib. Continue metoprolol and Xarelto.   6. History of CVA.  7. DM per  Dr Silvio Pate. Control improved with recent weight loss.  I will follow up in 6 months.

## 2017-11-07 ENCOUNTER — Ambulatory Visit: Payer: Medicare Other | Admitting: Cardiology

## 2017-11-08 ENCOUNTER — Other Ambulatory Visit: Payer: Self-pay | Admitting: Cardiology

## 2017-11-08 DIAGNOSIS — C7951 Secondary malignant neoplasm of bone: Secondary | ICD-10-CM | POA: Diagnosis not present

## 2017-11-08 DIAGNOSIS — C61 Malignant neoplasm of prostate: Secondary | ICD-10-CM | POA: Diagnosis not present

## 2017-11-14 ENCOUNTER — Ambulatory Visit (INDEPENDENT_AMBULATORY_CARE_PROVIDER_SITE_OTHER): Payer: Medicare Other | Admitting: Internal Medicine

## 2017-11-14 ENCOUNTER — Encounter: Payer: Self-pay | Admitting: Internal Medicine

## 2017-11-14 VITALS — BP 122/76 | HR 78 | Temp 97.9°F | Ht 70.0 in | Wt 207.5 lb

## 2017-11-14 DIAGNOSIS — I25119 Atherosclerotic heart disease of native coronary artery with unspecified angina pectoris: Secondary | ICD-10-CM

## 2017-11-14 DIAGNOSIS — I700 Atherosclerosis of aorta: Secondary | ICD-10-CM | POA: Diagnosis not present

## 2017-11-14 DIAGNOSIS — E1165 Type 2 diabetes mellitus with hyperglycemia: Secondary | ICD-10-CM

## 2017-11-14 DIAGNOSIS — Z0000 Encounter for general adult medical examination without abnormal findings: Secondary | ICD-10-CM

## 2017-11-14 DIAGNOSIS — E1140 Type 2 diabetes mellitus with diabetic neuropathy, unspecified: Secondary | ICD-10-CM | POA: Diagnosis not present

## 2017-11-14 DIAGNOSIS — C7951 Secondary malignant neoplasm of bone: Secondary | ICD-10-CM

## 2017-11-14 DIAGNOSIS — E1122 Type 2 diabetes mellitus with diabetic chronic kidney disease: Secondary | ICD-10-CM

## 2017-11-14 DIAGNOSIS — N183 Chronic kidney disease, stage 3 unspecified: Secondary | ICD-10-CM

## 2017-11-14 DIAGNOSIS — IMO0002 Reserved for concepts with insufficient information to code with codable children: Secondary | ICD-10-CM

## 2017-11-14 DIAGNOSIS — D696 Thrombocytopenia, unspecified: Secondary | ICD-10-CM | POA: Diagnosis not present

## 2017-11-14 DIAGNOSIS — I480 Paroxysmal atrial fibrillation: Secondary | ICD-10-CM

## 2017-11-14 DIAGNOSIS — C61 Malignant neoplasm of prostate: Secondary | ICD-10-CM | POA: Diagnosis not present

## 2017-11-14 LAB — HM DIABETES FOOT EXAM

## 2017-11-14 NOTE — Assessment & Plan Note (Signed)
Mild  Still bruises easily on arms Will recheck

## 2017-11-14 NOTE — Assessment & Plan Note (Signed)
DOE is less prominent--just notes fatigue with activity Not using the nitro

## 2017-11-14 NOTE — Assessment & Plan Note (Signed)
On ramipril Will check labs

## 2017-11-14 NOTE — Assessment & Plan Note (Signed)
Getting xgeva regularly at urologist

## 2017-11-14 NOTE — Assessment & Plan Note (Signed)
I have personally reviewed the Medicare Annual Wellness questionnaire and have noted 1. The patient's medical and social history 2. Their use of alcohol, tobacco or illicit drugs 3. Their current medications and supplements 4. The patient's functional ability including ADL's, fall risks, home safety risks and hearing or visual             impairment. 5. Diet and physical activities 6. Evidence for depression or mood disorders  The patients weight, height, BMI and visual acuity have been recorded in the chart I have made referrals, counseling and provided education to the patient based review of the above and I have provided the pt with a written personalized care plan for preventive services.  I have provided you with a copy of your personalized plan for preventive services. Please take the time to review along with your updated medication list.  No cancer screening due to age Yearly flu vaccine May consider shingrix Discussed exercise

## 2017-11-14 NOTE — Assessment & Plan Note (Addendum)
Radiographic Some decreased pulse on left but no symptoms Is on statin

## 2017-11-14 NOTE — Assessment & Plan Note (Signed)
Seems to have reasonable control for age and status Would only make changes if close to or above 9% No major neuro symptoms now

## 2017-11-14 NOTE — Assessment & Plan Note (Signed)
PSA down to 2 with lupron every 3 months

## 2017-11-14 NOTE — Assessment & Plan Note (Signed)
Regular today  on xarelto

## 2017-11-14 NOTE — Progress Notes (Signed)
Visual Acuity Screening   Right eye Left eye Both eyes  Without correction: 20/25 20/70 20/20   With correction:     Hearing Screening Comments: Pt is aware of hearing loss

## 2017-11-14 NOTE — Progress Notes (Signed)
Subjective:    Patient ID: Adrian Singh, male    DOB: Oct 12, 1933, 84 y.o.   MRN: [id number]  HPI Here for Medicare wellness visit and follow up of chronic health conditions Reviewed form and advanced directives Reviewed other doctors No alcohol or tobacco Exercises regularly Vision is fine Hearing is poor--not ready for hearing aides though No falls No depression or anhedonia Independent with instrumental ADLs No major memory issues  Doing okay Ongoing treatment for the metastatic prostate cancer On the lupron Gets xgeva--that gives him aches and pains Last PSA ~2 Does use the aleve and occasionally tramadol Usually in most pain in back/shoulders--after shower. Then it eases off  Did have atorvastatin and metoprolol doses cut down No chest pain lately Not much energy Exercise tolerance is not great---tires easy.  No sig SOB No palpitations No sig edema  Checks sugars every morning Average ~150 No sig hypoglycemic reactions---but he can feel it if is under 100 No foot sores or numbness---still has right plantar callous Diabetic shoes really help  Current Outpatient Medications on File Prior to Visit  Medication Sig Dispense Refill  . atorvastatin (LIPITOR) 40 MG tablet TAKE ONE TABLET BY MOUTH ONCE DAILY IN THE EVENING (Patient taking differently: 20 mg. TAKE ONE TABLET BY MOUTH ONCE DAILY IN THE EVENING) 30 tablet 5  . BAYER MICROLET LANCETS lancets Use to test blood sugar once daily dx: E11.40 100 each 1  . Blood Glucose Monitoring Suppl (CONTOUR NEXT EZ MONITOR) w/Device KIT USE TO TEST BLOOD SUGAR ONCE DAILY. Dx Code E11.40 1 kit 0  . Cholecalciferol (VITAMIN D3) 2000 units TABS Take 2,000 Units by mouth daily.    . CONTOUR NEXT TEST test strip USE ONE STRIP TO CHECK GLUCOSE ONCE DAILY 100 each 1  . docusate sodium (COLACE) 250 MG capsule Take 250 mg by mouth daily.    Marland Kitchen glimepiride (AMARYL) 4 MG tablet TAKE 1 TABLET BY MOUTH TWICE DAILY 180 tablet 1  .  metFORMIN (GLUCOPHAGE) 500 MG tablet Take 1 tablet (500 mg total) by mouth 3 (three) times daily. 270 tablet 3  . metoprolol succinate (TOPROL-XL) 100 MG 24 hr tablet TAKE 1 TABLET BY MOUTH ONCE DAILY (TAKE  WITH  OR  IMMEDIATELY  FOLLOWING  A  MEAL) (Patient taking differently: TAKE 1/2 TABLET BY MOUTH ONCE DAILY (TAKE  WITH  OR  IMMEDIATELY  FOLLOWING  A  MEAL)) 30 tablet 0  . Multiple Vitamin (MULTIVITAMIN WITH MINERALS) TABS tablet Take 1 tablet by mouth 2 (two) times a week. ONE-A-DAY FOR MEN    . Naproxen Sodium (ALEVE) 220 MG CAPS Take 220 mg by mouth daily.     . ramipril (ALTACE) 2.5 MG capsule TAKE ONE CAPSULE BY MOUTH ONCE DAILY 90 capsule 1  . rivaroxaban (XARELTO) 20 MG TABS tablet Take 1 tablet (20 mg total) by mouth daily with supper. 30 tablet 6  . traMADol (ULTRAM) 50 MG tablet TAKE 1 TABLET BY MOUTH THREE TIMES DAILY AS NEEDED 30 tablet 0  . nitroGLYCERIN (NITROSTAT) 0.4 MG SL tablet Place 1 tablet (0.4 mg total) under the tongue every 5 (five) minutes as needed for chest pain. (Patient not taking: Reported on 11/14/2017) 25 tablet 3   No current facility-administered medications on file prior to visit.     Allergies  Allergen Reactions  . Soma [Carisoprodol] Other (See Comments)    "did a number on me"  . Doxazosin Other (See Comments)    dizziness    Past  Medical History:  Diagnosis Date  . Arthritis    "legs, back" (10/06/2015)  . Atrial fibrillation ([place])    x1  . CAD (coronary artery disease)    nonobstructive  . Cancer ([place])    skin cancer on ear (froze it off) and back (cut it off)  . Dysrhythmia   . Esophageal reflux    hx of  . History of kidney stones   . Hx of heart artery stent   . Hypertrophy of prostate without urinary obstruction and other lower urinary tract symptoms (LUTS)   . Osteoarthrosis, unspecified whether generalized or localized, unspecified site   . Stroke [facility]) 03/2014   "had a series of mini strokes; maybe 4"; denies residual on  10/06/2015  . Thrombocytopenia ([place])   . Type II diabetes mellitus ([place])    type 2  . Unspecified essential hypertension     Past Surgical History:  Procedure Laterality Date  . BACK SURGERY     2001  . CARDIAC CATHETERIZATION  12/2007  . CARDIAC CATHETERIZATION N/A 09/22/2015   Procedure: Left Heart Cath and Coronary Angiography;  Surgeon: Peter M Martinique, MD;  Location: [place] [facility];  Service: Cardiovascular;  Laterality: N/A;  . CARDIAC CATHETERIZATION N/A 10/06/2015   Procedure: Coronary Stent Intervention;  Surgeon: Peter M Martinique, MD;  Location: [place] [facility];  Service: Cardiovascular;  Laterality: N/A;  . CARDIAC CATHETERIZATION N/A 02/29/2016   Procedure: Left Heart Cath and Coronary Angiography;  Surgeon: Jettie Booze, MD;  Location: [place] [facility];  Service: Cardiovascular;  Laterality: N/A;  . COLONOSCOPY W/ BIOPSIES AND POLYPECTOMY    . CORONARY STENT PLACEMENT  10/06/2015   [name]  with DES  . EAR CYST EXCISION N/A 04/06/2015   Procedure: EXCISION OF SCALP CYST;  Surgeon: Donnie Mesa, MD;  Location: [place];  Service: General;  Laterality: N/A;  . ESOPHAGOGASTRODUODENOSCOPY (EGD) WITH ESOPHAGEAL DILATION  2001   with dilation  . LUMBAR DISC SURGERY  09/26/1999   "cleaned out arthritis and bone spurs"  . SHOULDER ARTHROSCOPY W/ ROTATOR CUFF REPAIR Left 2005  . THULIUM LASER TURP (TRANSURETHRAL RESECTION OF PROSTATE) N/A 01/21/2017   Procedure: Marcelino Duster LASER TURP (TRANSURETHRAL RESECTION OF PROSTATE) CAUTERIZATION OF BLADDER LESION;  Surgeon: Franchot Gallo, MD;  Location[facility];  Service: Urology;  Laterality: N/A;    Family History  Problem Relation Age of Onset  . Stroke Father   . Peripheral vascular disease Father        amputation  . Heart failure Mother        CHF  . Coronary artery disease Mother   . Heart attack Mother 69       Multiple  . COPD Brother   . Heart disease Brother   . Cancer Brother        Bone  . Lung cancer Sister      Social History   Socioeconomic History  . Marital status: Married    Spouse name: Not on file  . Number of children: 4  . Years of education: Not on file  . Highest education level: Not on file  Social Needs  . Financial resource strain: Not on file  . Food insecurity - worry: Not on file  . Food insecurity - inability: Not on file  . Transportation needs - medical: Not on file  . Transportation needs - non-medical: Not on file  Occupational History  . Occupation: Radiographer, 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[employer]    Employer: RETIRED  Tobacco Use  . Smoking status: Former Smoker    Years: 3.00    Types: Cigarettes  . Smokeless tobacco: Never Used  . Tobacco comment: " Quit smoking by age 64; was a someday smoker "  Substance and Sexual Activity  . Alcohol use: No  . Drug use: No  . Sexual activity: No  Other Topics Concern  . Not on file  Social History Narrative   No living will   Plans wife and then children to make health care decisions for him if unable   Would request at least attempts at resuscitation but no prolonged life support   Doesn't think he would want tube feeds if cognitively unaware   Review of Systems Appetite is good Weight up again a bit Sleeps well Wears seat belt No teeth problems---keeps up with dentist No rash or suspicious skin problems. Does have cream for dry skin Bowels are "iffy"---loose at times. Uses med at times (imodium). No blood Voiding okay now No severe heartburn---no meds except rare zantac. No dysphagia Still bruises very easy    Objective:   Physical Exam  Constitutional: He is oriented to person, place, and time. He appears well-developed. No distress.  HENT:  Mouth/Throat: Oropharynx is clear and moist. No oropharyngeal exudate.  Full dentures  Neck: No thyromegaly present.  Cardiovascular: Normal rate, regular rhythm, normal heart sounds and intact distal pulses. Exam reveals no gallop.  No murmur heard. Faint pulse on left, 1+  right foot  Pulmonary/Chest: Effort normal and breath sounds normal. No respiratory distress. He has no wheezes. He has no rales.  Abdominal: Soft. He exhibits no distension. There is no tenderness. There is no rebound and no guarding.  Musculoskeletal: He exhibits no edema or tenderness.  Lymphadenopathy:    He has no cervical adenopathy.  Neurological: He is alert and oriented to person, place, and time.  President--- "Dwaine Deter, Bush" [phone] [id number] Recall 2/3  Fairly normal sensation in feet  Skin:  Plantar callous on right under metatarsals  Psychiatric: He has a normal mood and affect. His behavior is normal.          Assessment & Plan:

## 2017-11-15 ENCOUNTER — Other Ambulatory Visit: Payer: Self-pay | Admitting: Internal Medicine

## 2017-11-15 LAB — COMPREHENSIVE METABOLIC PANEL
ALT: 22 U/L (ref 0–53)
AST: 25 U/L (ref 0–37)
Albumin: 3.6 g/dL (ref 3.5–5.2)
Alkaline Phosphatase: 49 U/L (ref 39–117)
BUN: 22 mg/dL (ref 6–23)
CO2: 27 mEq/L (ref 19–32)
Calcium: 9.8 mg/dL (ref 8.4–10.5)
Chloride: 103 mEq/L (ref 96–112)
Creatinine, Ser: 1.48 mg/dL (ref 0.40–1.50)
GFR: 48.45 mL/min — ABNORMAL LOW (ref >60.00)
Glucose, Bld: 146 mg/dL — ABNORMAL HIGH (ref 70–99)
Potassium: 4.7 mEq/L (ref 3.5–5.1)
Sodium: 139 mEq/L (ref 135–145)
Total Bilirubin: 0.5 mg/dL (ref 0.2–1.2)
Total Protein: 7.2 g/dL (ref 6.0–8.3)

## 2017-11-15 LAB — CBC
HCT: 35.8 % — ABNORMAL LOW (ref 39.0–52.0)
Hemoglobin: 11.8 g/dL — ABNORMAL LOW (ref 13.0–17.0)
MCHC: 33 g/dL (ref 30.0–36.0)
MCV: 85.2 fl (ref 78.0–100.0)
Platelets: 162 10*3/uL (ref 150.0–400.0)
RBC: 4.21 Mil/uL — ABNORMAL LOW (ref 4.22–5.81)
RDW: 14 % (ref 11.5–15.5)
WBC: 7.1 10*3/uL (ref 4.0–10.5)

## 2017-11-15 LAB — LIPID PANEL
Cholesterol: 83 mg/dL (ref 0–200)
HDL: 32.5 mg/dL — ABNORMAL LOW (ref >39.00)
LDL Cholesterol: 11 mg/dL (ref 0–99)
NonHDL: 50.81
Total CHOL/HDL Ratio: 3
Triglycerides: 199 mg/dL — ABNORMAL HIGH (ref 0.0–149.0)
VLDL: 39.8 mg/dL (ref 0.0–40.0)

## 2017-11-15 LAB — T4, FREE: Free T4: 0.71 ng/dL (ref 0.60–1.60)

## 2017-11-15 LAB — HEMOGLOBIN A1C: Hgb A1c MFr Bld: 8.7 % — ABNORMAL HIGH (ref 4.6–6.5)

## 2017-11-18 ENCOUNTER — Telehealth: Payer: Self-pay | Admitting: Cardiology

## 2017-11-18 NOTE — Telephone Encounter (Signed)
Medication samples have been provided to the patient.  Drug name: Xarelto 20 mg  Qty: 14    LOT: [id number]  Exp.Date: 02/21  Samples left at front desk for patient pick-up. Patient notified.

## 2017-11-18 NOTE — Telephone Encounter (Signed)
Patient calling the office for samples of medication:   1.  What medication and dosage are you requesting samples for? Eliquis 20 mg   2.  Are you currently out of this medication? No, patient has two pills left

## 2017-11-21 NOTE — Progress Notes (Signed)
Adrian Singh Date of Birth: [birth year]   History of Present Illness: Adrian Singh is seen today for follow up.  He has a history of nonobstructive coronary disease by cardiac catheterization 2009. He also has a history of orthostatic dizziness. He was admitted in July 9373 with an embolic stroke presumably due to paroxysmal Afib. He was anticoagulated with Eliquis. The patient developed tachycardia and HA on Eliquis that resolved when he was switched to Xarelto. Previous orthostatic dizziness improved with stopping diuretic and alpha blockers he was taking for enlarged prostate.  He was seen in the office on 08/10/2015 with shortness of breath with exertion. He had outpatient stress test done on 08/25/2015 which showed EF mildly decreased 45-54%, no significant ST elevation or depression during stress portion, overall considered low risk study, however has prior myocardial infarction with peri-infarct ischemia. The peri-infarct ischemia was felt to be new compared to the previous stress test, therefore outpatient cardiac catheterization was arranged. He underwent planned study on 09/22/2015 which showed 30% mid LAD lesion, 99% mid to distal left circumflex lesion, 50% OM 3 lesion. Due to poor support from right radial approach and concern for renal function, it was planned for staged PCI at a later time. He was admitted to [facility] on 10/06/15 for staged PCI/DES to mid LCx lesion that wa 99% stenosed. There was residual 30% mid LAD stenosis to be treated medically.  He was admitted in June 2017 with chest pain. Cardiac cath showed no new disease and prior stent was patent. Later when seen as an outpatient he developed orthostatic dizziness. maxzide was stopped and metoprolol was reduced with improvement.  When seen last year he reported marked worsening of orthostatic dizziness. Marland Kitchen He was back on Maxide and also taking Flomax for obstructive urinary symptoms. We stopped both of these medications and referred  him to urology. He underwent laser TURP on 12/17/85 without complication. He has been diagnosed with metastatic prostate CA to bone and is now receiving hormonal therapy.   On follow up today he is doing quite well. Denies any chest pain, dyspnea, palpitations. Rarely has dizziness if stands too quickly. Average BS is 151. A1c is stable. Tolerating meds well.  Current Outpatient Medications on File Prior to Visit  Medication Sig Dispense Refill  . atorvastatin (LIPITOR) 40 MG tablet Take 20 mg by mouth daily.    Marland Kitchen BAYER MICROLET LANCETS lancets Use to test blood sugar once daily dx: E11.40 100 each 1  . Blood Glucose Monitoring Suppl (CONTOUR NEXT EZ MONITOR) w/Device KIT USE TO TEST BLOOD SUGAR ONCE DAILY. Dx Code E11.40 1 kit 0  . Cholecalciferol (VITAMIN D3) 2000 units TABS Take 2,000 Units by mouth daily.    . CONTOUR NEXT TEST test strip USE ONE STRIP TO CHECK GLUCOSE ONCE DAILY 100 each 1  . docusate sodium (COLACE) 250 MG capsule Take 250 mg by mouth daily.    Marland Kitchen glimepiride (AMARYL) 4 MG tablet TAKE 1 TABLET BY MOUTH TWICE DAILY 180 tablet 1  . metFORMIN (GLUCOPHAGE) 500 MG tablet TAKE 1 TABLET BY MOUTH THREE TIMES DAILY 270 tablet 3  . metoprolol succinate (TOPROL-XL) 100 MG 24 hr tablet Take 50 mg by mouth daily. Take with or immediately following a meal.    . Multiple Vitamin (MULTIVITAMIN WITH MINERALS) TABS tablet Take 1 tablet by mouth 2 (two) times a week. ONE-A-DAY FOR MEN    . Naproxen Sodium (ALEVE) 220 MG CAPS Take 220 mg by mouth daily.     Marland Kitchen  nitroGLYCERIN (NITROSTAT) 0.4 MG SL tablet Place 1 tablet (0.4 mg total) under the tongue every 5 (five) minutes as needed for chest pain. 25 tablet 3  . ramipril (ALTACE) 2.5 MG capsule TAKE ONE CAPSULE BY MOUTH ONCE DAILY 90 capsule 1  . rivaroxaban (XARELTO) 20 MG TABS tablet Take 1 tablet (20 mg total) by mouth daily with supper. 30 tablet 6  . tamsulosin (FLOMAX) 0.4 MG CAPS capsule TAKE 1 CAPSULE BY MOUTH ONCE DAILY 90 capsule 3  .  traMADol (ULTRAM) 50 MG tablet TAKE 1 TABLET BY MOUTH THREE TIMES DAILY AS NEEDED 30 tablet 0   No current facility-administered medications on file prior to visit.     Allergies  Allergen Reactions  . Soma [Carisoprodol] Other (See Comments)    "did a number on me"  . Doxazosin Other (See Comments)    dizziness    Past Medical History:  Diagnosis Date  . Arthritis    "legs, back" (10/06/2015)  . Atrial fibrillation ([place])    x1  . CAD (coronary artery disease)    nonobstructive  . Cancer ([place])    skin cancer on ear (froze it off) and back (cut it off)  . Dysrhythmia   . Esophageal reflux    hx of  . History of kidney stones   . Hx of heart artery stent   . Hypertrophy of prostate without urinary obstruction and other lower urinary tract symptoms (LUTS)   . Osteoarthrosis, unspecified whether generalized or localized, unspecified site   . Stroke [facility]) 03/2014   "had a series of mini strokes; maybe 4"; denies residual on 10/06/2015  . Thrombocytopenia ([place])   . Type II diabetes mellitus ([place])    type 2  . Unspecified essential hypertension     Past Surgical History:  Procedure Laterality Date  . BACK SURGERY     2001  . CARDIAC CATHETERIZATION  12/2007  . CARDIAC CATHETERIZATION N/A 09/22/2015   Procedure: Left Heart Cath and Coronary Angiography;  Surgeon: Nayzeth Altman M Martinique, MD;  Location: [place] [facility];  Service: Cardiovascular;  Laterality: N/A;  . CARDIAC CATHETERIZATION N/A 10/06/2015   Procedure: Coronary Stent Intervention;  Surgeon: Yoshiharu Brassell M Martinique, MD;  Location: [place] [facility];  Service: Cardiovascular;  Laterality: N/A;  . CARDIAC CATHETERIZATION N/A 02/29/2016   Procedure: Left Heart Cath and Coronary Angiography;  Surgeon: Jettie Booze, MD;  Location: [place] [facility];  Service: Cardiovascular;  Laterality: N/A;  . COLONOSCOPY W/ BIOPSIES AND POLYPECTOMY    . CORONARY STENT PLACEMENT  10/06/2015   [name]  with DES  . EAR CYST EXCISION N/A 04/06/2015    Procedure: EXCISION OF SCALP CYST;  Surgeon: Donnie Mesa, MD;  Location: [place];  Service: General;  Laterality: N/A;  . ESOPHAGOGASTRODUODENOSCOPY (EGD) WITH ESOPHAGEAL DILATION  2001   with dilation  . LUMBAR DISC SURGERY  09/26/1999   "cleaned out arthritis and bone spurs"  . SHOULDER ARTHROSCOPY W/ ROTATOR CUFF REPAIR Left 2005  . THULIUM LASER TURP (TRANSURETHRAL RESECTION OF PROSTATE) N/A 01/21/2017   Procedure: Marcelino Duster LASER TURP (TRANSURETHRAL RESECTION OF PROSTATE) CAUTERIZATION OF BLADDER LESION;  Surgeon: Franchot Gallo, MD;  Location: [facility];  Service: Urology;  Laterality: N/A;    Social History   Tobacco Use  Smoking Status Former Smoker  . Years: 3.00  . Types: Cigarettes  Smokeless Tobacco Never Used  Tobacco Comment   " Quit smoking by age 40; was a someday smoker "    Social  History   Substance and Sexual Activity  Alcohol Use No    Family History  Problem Relation Age of Onset  . Stroke Father   . Peripheral vascular disease Father        amputation  . Heart failure Mother        CHF  . Coronary artery disease Mother   . Heart attack Mother 37       Multiple  . COPD Brother   . Heart disease Brother   . Cancer Brother        Bone  . Lung cancer Sister     Review of Systems: The review of systems is as noted in HPI.  All other systems were reviewed and are negative.  Physical Exam: BP 130/64   Pulse 71   Ht '5\' 10"'  (1.778 m)   Wt 205 lb 9.6 oz (93.3 kg)   BMI 29.50 kg/m   GENERAL:  Well appearing HEENT:  PERRL, EOMI, sclera are clear. Oropharynx is clear. NECK:  No jugular venous distention, carotid upstroke brisk and symmetric, no bruits, no thyromegaly or adenopathy LUNGS:  Clear to auscultation bilaterally CHEST:  Unremarkable HEART:  RRR,  PMI not displaced or sustained,S1 and S2 within normal limits, no S3, no S4: no clicks, no rubs, no murmurs ABD:  Soft, nontender. BS +, no masses or bruits. No hepatomegaly, no splenomegaly EXT:  2  + pulses throughout, no edema, no cyanosis no clubbing SKIN:  Warm and dry.  No rashes NEURO:  Alert and oriented x 3. Cranial nerves II through XII intact. PSYCH:  Cognitively intact    LABORATORY DATA: Lab Results  Component Value Date   WBC 7.1 11/14/2017   HGB 11.8 (L) 11/14/2017   HCT 35.8 (L) 11/14/2017   PLT 162.0 11/14/2017   GLUCOSE 146 (H) 11/14/2017   CHOL 83 11/14/2017   TRIG 199.0 (H) 11/14/2017   HDL 32.50 (L) 11/14/2017   LDLDIRECT 77.0 03/28/2015   LDLCALC 11 11/14/2017   ALT 22 11/14/2017   AST 25 11/14/2017   NA 139 11/14/2017   K 4.7 11/14/2017   CL 103 11/14/2017   CREATININE 1.48 11/14/2017   BUN 22 11/14/2017   CO2 27 11/14/2017   TSH 3.33 08/26/2013   PSA 158.00 (H) 01/14/2017   INR 1.45 02/29/2016   HGBA1C 8.7 (H) 11/14/2017   MICROALBUR 17.2 (H) 05/23/2012   Ecg today shows NSR with low voltage. Old inferior/posterior infarct. I have personally reviewed and interpreted this study.  Assessment / Plan: 1. Orthostatic hypotension. This has been a chronic problem- ? Dysautonomia due to DM. It is exacerbated by medication- especially diuretics and Flomax. Resolved after stopping these meds. Now s/p TURP.    2. CAD s/p DES of mid LCx in January 2017. No  angina. Repeat cardiac cath in June 2017 showed continued patency. Off antiplatelet therapy. Continue Xarelto.  3. Hypertension controlled.   4. CKD stage 3.   5. Paroxysmal Afib. Continue metoprolol and Xarelto.   6. History of CVA.  7. DM per  Dr Silvio Pate. A1c is not ideal but plan to continue current Rx for now.   8. Prostate CA metastatic to bone. On hormonal therapy   I will follow up in 6 months.

## 2017-11-22 ENCOUNTER — Ambulatory Visit: Payer: Medicare Other | Admitting: Cardiology

## 2017-11-22 ENCOUNTER — Encounter: Payer: Self-pay | Admitting: Cardiology

## 2017-11-22 VITALS — BP 130/64 | HR 71 | Ht 70.0 in | Wt 205.6 lb

## 2017-11-22 DIAGNOSIS — I10 Essential (primary) hypertension: Secondary | ICD-10-CM

## 2017-11-22 DIAGNOSIS — I951 Orthostatic hypotension: Secondary | ICD-10-CM

## 2017-11-22 DIAGNOSIS — I2510 Atherosclerotic heart disease of native coronary artery without angina pectoris: Secondary | ICD-10-CM | POA: Diagnosis not present

## 2017-11-22 DIAGNOSIS — I480 Paroxysmal atrial fibrillation: Secondary | ICD-10-CM | POA: Diagnosis not present

## 2017-11-22 DIAGNOSIS — Z9861 Coronary angioplasty status: Secondary | ICD-10-CM | POA: Diagnosis not present

## 2017-11-22 NOTE — Patient Instructions (Addendum)
Continue your current therapy  I will see you in 6 months.   

## 2017-11-29 ENCOUNTER — Other Ambulatory Visit: Payer: Self-pay | Admitting: Internal Medicine

## 2017-12-10 DIAGNOSIS — C7951 Secondary malignant neoplasm of bone: Secondary | ICD-10-CM | POA: Diagnosis not present

## 2017-12-10 DIAGNOSIS — C61 Malignant neoplasm of prostate: Secondary | ICD-10-CM | POA: Diagnosis not present

## 2017-12-13 ENCOUNTER — Other Ambulatory Visit: Payer: Self-pay | Admitting: Internal Medicine

## 2017-12-13 NOTE — Telephone Encounter (Signed)
Last filled 10-14-17 #30 Last OV 11-14-17 Next OV 05-15-18

## 2017-12-30 ENCOUNTER — Telehealth: Payer: Self-pay | Admitting: Cardiology

## 2017-12-30 NOTE — Telephone Encounter (Signed)
New Message ° ° ° °Patient calling the office for samples of medication: ° ° °1.  What medication and dosage are you requesting samples for? rivaroxaban (XARELTO) 20 MG TABS tablet ° °2.  Are you currently out of this medication? yes ° ° ° °

## 2017-12-30 NOTE — Telephone Encounter (Signed)
Medication samples have been provided to the patient.  Drug name: Xarelto 20  Qty: 21    LOT: [id number]  Exp.Date: 03/21  Samples left at front desk for patient pick-up. Patient notified.

## 2018-01-08 ENCOUNTER — Other Ambulatory Visit: Payer: Self-pay | Admitting: Urology

## 2018-01-08 DIAGNOSIS — R310 Gross hematuria: Secondary | ICD-10-CM | POA: Diagnosis not present

## 2018-01-08 DIAGNOSIS — C7951 Secondary malignant neoplasm of bone: Secondary | ICD-10-CM | POA: Diagnosis not present

## 2018-01-08 DIAGNOSIS — N401 Enlarged prostate with lower urinary tract symptoms: Secondary | ICD-10-CM | POA: Diagnosis not present

## 2018-01-08 DIAGNOSIS — C61 Malignant neoplasm of prostate: Secondary | ICD-10-CM

## 2018-01-10 ENCOUNTER — Other Ambulatory Visit: Payer: Self-pay | Admitting: Internal Medicine

## 2018-01-10 DIAGNOSIS — E1165 Type 2 diabetes mellitus with hyperglycemia: Secondary | ICD-10-CM | POA: Diagnosis not present

## 2018-01-20 ENCOUNTER — Encounter (HOSPITAL_COMMUNITY)
Admission: RE | Admit: 2018-01-20 | Discharge: 2018-01-20 | Disposition: A | Discharge status: Home / Self Care | Payer: Medicare Other | Source: Ambulatory Visit | Attending: Urology | Admitting: Urology

## 2018-01-20 ENCOUNTER — Telehealth: Payer: Self-pay | Admitting: Cardiovascular Disease

## 2018-01-20 DIAGNOSIS — C61 Malignant neoplasm of prostate: Secondary | ICD-10-CM | POA: Insufficient documentation

## 2018-01-20 MED ORDER — TECHNETIUM TC 99M MEDRONATE IV KIT
20.0000 | PACK | Freq: Once | INTRAVENOUS | Status: AC | PRN
  Administered 2018-01-20: 20.7 via INTRAVENOUS

## 2018-01-20 NOTE — Telephone Encounter (Signed)
Patient calling the office for samples of medication:   1.  What medication and dosage are you requesting samples for? Xarelto-pt says he will be in this area around 9:30-please let him know so he can them upj  2.  Are you currently out of this medication?  A couple

## 2018-01-20 NOTE — Telephone Encounter (Signed)
Patient has been made aware that samples are available at the front.  Medication Samples have been provided to the patient.  Drug name: Xarelto       Strength: 20 mg        Qty: 3 bottles  LOT: [id number] Exp.Date: 04/21

## 2018-02-07 ENCOUNTER — Other Ambulatory Visit: Payer: Self-pay | Admitting: Internal Medicine

## 2018-02-07 NOTE — Telephone Encounter (Signed)
Last filled 12-13-17 #30  Last OV 11-14-17 Next OV 05-15-18

## 2018-02-11 ENCOUNTER — Telehealth: Payer: Self-pay | Admitting: Cardiology

## 2018-02-11 DIAGNOSIS — C61 Malignant neoplasm of prostate: Secondary | ICD-10-CM | POA: Diagnosis not present

## 2018-02-11 DIAGNOSIS — C7951 Secondary malignant neoplasm of bone: Secondary | ICD-10-CM | POA: Diagnosis not present

## 2018-02-11 NOTE — Telephone Encounter (Signed)
Medication samples have been provided to the patient.  Drug name: Xarelto 20 mg  Qty: 4 bottles    LOT: [id number]  Exp.Date: 04/21  Samples left at front desk for patient pick-up. Left message (ok per DPR) to make patient aware.

## 2018-02-11 NOTE — Telephone Encounter (Signed)
New Message    Patient calling the office for samples of medication:   1.  What medication and dosage are you requesting samples for? Xarelto 20mg   2.  Are you currently out of this medication?  Has 4 left

## 2018-03-11 DIAGNOSIS — C7951 Secondary malignant neoplasm of bone: Secondary | ICD-10-CM | POA: Diagnosis not present

## 2018-03-11 DIAGNOSIS — C61 Malignant neoplasm of prostate: Secondary | ICD-10-CM | POA: Diagnosis not present

## 2018-03-12 ENCOUNTER — Telehealth: Payer: Self-pay | Admitting: Cardiology

## 2018-03-12 NOTE — Telephone Encounter (Signed)
Medication samples have been provided to the patient.  Drug name: xarelto 20mg   Qty: 3 bottles  LOT: [id number]  Exp.Date: 02/2020  Samples left at front desk for patient pick-up. Patient notified.  Fidel Levy 10:35 AM 03/12/2018

## 2018-03-12 NOTE — Telephone Encounter (Signed)
New Message   Patient calling the office for samples of medication:   1.  What medication and dosage are you requesting samples for? rivaroxaban (XARELTO) 20 MG TABS tablet  2.  Are you currently out of this medication? Pt has 2 pills left

## 2018-03-20 ENCOUNTER — Other Ambulatory Visit: Payer: Self-pay | Admitting: Internal Medicine

## 2018-03-20 ENCOUNTER — Other Ambulatory Visit: Payer: Self-pay | Admitting: Cardiology

## 2018-03-24 ENCOUNTER — Other Ambulatory Visit: Payer: Self-pay | Admitting: Internal Medicine

## 2018-03-24 NOTE — Telephone Encounter (Signed)
Last filled 02-07-18 #30 Last Ov 11-14-17 Next Ov 05-15-18  [place]

## 2018-04-03 ENCOUNTER — Telehealth: Payer: Self-pay | Admitting: Cardiology

## 2018-04-03 NOTE — Telephone Encounter (Signed)
New Message      Patient calling the office for samples of medication:   1.  What medication and dosage are you requesting samples for?Xareto 20mg   2.  Are you currently out of this medication? No/has only 1 left

## 2018-04-04 NOTE — Telephone Encounter (Signed)
Pt aware (xarelto  20 mg ) samples left at front desk at [address] location .Adonis Housekeeper

## 2018-04-04 NOTE — Telephone Encounter (Signed)
Follow up   Pt calling to check on his samples of Xarelto request, states he will be out today. Please call

## 2018-04-08 DIAGNOSIS — C61 Malignant neoplasm of prostate: Secondary | ICD-10-CM | POA: Diagnosis not present

## 2018-04-08 DIAGNOSIS — C7951 Secondary malignant neoplasm of bone: Secondary | ICD-10-CM | POA: Diagnosis not present

## 2018-04-17 ENCOUNTER — Telehealth: Payer: Self-pay | Admitting: Cardiology

## 2018-04-17 NOTE — Telephone Encounter (Signed)
Patient calling the office for samples of medication:   1.  What medication and dosage are you requesting samples for? Xarelto  20mg   2.  Are you currently out of this medication? no

## 2018-04-17 NOTE — Telephone Encounter (Signed)
Pt advise samples are available for pick up at the front desk.  Name: Xarelto 20 mg            QTY: 3 bottles  Lot # [id number] Expires: 6/21

## 2018-05-05 ENCOUNTER — Telehealth: Payer: Self-pay | Admitting: Cardiology

## 2018-05-05 DIAGNOSIS — C61 Malignant neoplasm of prostate: Secondary | ICD-10-CM | POA: Diagnosis not present

## 2018-05-05 MED ORDER — RIVAROXABAN 20 MG PO TABS: 20.0000 mg | ORAL_TABLET | Freq: Every day | ORAL | 0 refills | Status: DC

## 2018-05-05 NOTE — Telephone Encounter (Signed)
Returned call to patient Xarelto 20 mg samples left at [name] office front desk. 

## 2018-05-05 NOTE — Telephone Encounter (Signed)
Patient calling the office for samples of medication:   1.  What medication and dosage are you requesting samples for? xarelto 2.  Are you currently out of this medication? 4 more left

## 2018-05-12 DIAGNOSIS — C7951 Secondary malignant neoplasm of bone: Secondary | ICD-10-CM | POA: Diagnosis not present

## 2018-05-12 DIAGNOSIS — C61 Malignant neoplasm of prostate: Secondary | ICD-10-CM | POA: Diagnosis not present

## 2018-05-15 ENCOUNTER — Telehealth: Payer: Self-pay

## 2018-05-15 ENCOUNTER — Encounter: Payer: Self-pay | Admitting: Internal Medicine

## 2018-05-15 ENCOUNTER — Ambulatory Visit: Payer: Medicare Other | Admitting: Internal Medicine

## 2018-05-15 VITALS — BP 152/76 | HR 74 | Temp 97.9°F | Ht 70.0 in | Wt 200.0 lb

## 2018-05-15 DIAGNOSIS — I480 Paroxysmal atrial fibrillation: Secondary | ICD-10-CM

## 2018-05-15 DIAGNOSIS — E1140 Type 2 diabetes mellitus with diabetic neuropathy, unspecified: Secondary | ICD-10-CM | POA: Diagnosis not present

## 2018-05-15 DIAGNOSIS — I25119 Atherosclerotic heart disease of native coronary artery with unspecified angina pectoris: Secondary | ICD-10-CM

## 2018-05-15 DIAGNOSIS — E1165 Type 2 diabetes mellitus with hyperglycemia: Secondary | ICD-10-CM

## 2018-05-15 DIAGNOSIS — IMO0002 Reserved for concepts with insufficient information to code with codable children: Secondary | ICD-10-CM

## 2018-05-15 DIAGNOSIS — I10 Essential (primary) hypertension: Secondary | ICD-10-CM | POA: Diagnosis not present

## 2018-05-15 DIAGNOSIS — C61 Malignant neoplasm of prostate: Secondary | ICD-10-CM

## 2018-05-15 LAB — POCT GLYCOSYLATED HEMOGLOBIN (HGB A1C): Hemoglobin A1C: 8.5 % — AB (ref 4.0–5.6)

## 2018-05-15 MED ORDER — METOPROLOL SUCCINATE ER 100 MG PO TB24: ORAL_TABLET | ORAL | 3 refills | Status: DC

## 2018-05-15 MED ORDER — METFORMIN HCL 500 MG PO TABS: 500.0000 mg | ORAL_TABLET | Freq: Three times a day (TID) | ORAL | 3 refills | Status: DC

## 2018-05-15 MED ORDER — NITROGLYCERIN 0.4 MG SL SUBL: 0.4000 mg | SUBLINGUAL_TABLET | SUBLINGUAL | 0 refills | Status: AC | PRN

## 2018-05-15 MED ORDER — ATORVASTATIN CALCIUM 40 MG PO TABS: 20.0000 mg | ORAL_TABLET | Freq: Every day | ORAL | 3 refills | Status: DC

## 2018-05-15 NOTE — Assessment & Plan Note (Signed)
BP Readings from Last 3 Encounters:  05/15/18 (!) 152/76  11/22/17 130/64  11/14/17 122/76   Up some today---but related to his anxiety about the pain, etc Would consider increasing the metoprolol or ACEI if stays up

## 2018-05-15 NOTE — Telephone Encounter (Signed)
Spoke to patient he stated he has been having chest pain almost every day.Stated PCP advised him to see Dr.Jordan.Stated he has a appointment 06/12/18 but would like to be seen sooner.Advised Dr.Jordan has a cancellation for tomorrow appointment scheduled at 1:20 pm.

## 2018-05-15 NOTE — Addendum Note (Signed)
Addended by: Kris Mouton on: 05/15/2018 03:55 PM   Modules accepted: Orders

## 2018-05-15 NOTE — Assessment & Plan Note (Signed)
Lab Results  Component Value Date   HGBA1C 8.5 (A) 05/15/2018   Given age and other diagnoses, this is acceptable No medicine changes

## 2018-05-15 NOTE — Assessment & Plan Note (Signed)
Continues on Rx

## 2018-05-15 NOTE — Assessment & Plan Note (Signed)
Still regular On eliquis 

## 2018-05-15 NOTE — Assessment & Plan Note (Addendum)
Increased pattern of atypical chest pain Not clearly like his pre-stent symptoms but "sorta" EKG shows sinus at 67 Inferior Q waves and posterior changes also Lateral T wave inversions All this is unchanged since March  Will refill nitro See if Dr Martinique can push up his follow up

## 2018-05-15 NOTE — Progress Notes (Signed)
   Subjective:    Patient ID: Adrian Singh, male    DOB: [birth year], 84 y.o.   MRN: [id number]  HPI Here with wife for follow up of diabetes and other chronic health conditions  Having some chest pain Has nitro--but hasn't been taking (it is old) Upper left chest and left arm---just when getting up or stirring around Mostly in AM and can last much of the day Just feels "sore" No SOB No diaphoresis with these spells (but sweats with the lupron) No heartburn Going on several weeks  Continues with oncology Also on xgeva for the prostate  Checking sugars every day Usually 140-160's. Occasionally higher but still under 200 No hypoglycemic reactions Eating a lot of fresh things from garden  Review of Systems Appetite is good Weight is down a few pounds Sleeps fine    Objective:   Physical Exam  Constitutional: He appears well-developed. No distress.  Neck: No thyromegaly present.  Cardiovascular: Normal rate, regular rhythm, normal heart sounds and intact distal pulses. Exam reveals no gallop.  No murmur heard. Respiratory: Effort normal and breath sounds normal. No respiratory distress. He has no wheezes. He has no rales.  Lymphadenopathy:    He has no cervical adenopathy.  Skin:  No foot lesions           Assessment & Plan:

## 2018-05-16 ENCOUNTER — Encounter: Payer: Self-pay | Admitting: Cardiology

## 2018-05-16 ENCOUNTER — Ambulatory Visit: Payer: Medicare Other | Admitting: Cardiology

## 2018-05-16 VITALS — BP 199/92 | HR 74 | Ht 70.0 in | Wt 203.4 lb

## 2018-05-16 DIAGNOSIS — R079 Chest pain, unspecified: Secondary | ICD-10-CM | POA: Diagnosis not present

## 2018-05-16 DIAGNOSIS — Z9861 Coronary angioplasty status: Secondary | ICD-10-CM

## 2018-05-16 DIAGNOSIS — I2510 Atherosclerotic heart disease of native coronary artery without angina pectoris: Secondary | ICD-10-CM

## 2018-05-16 DIAGNOSIS — I480 Paroxysmal atrial fibrillation: Secondary | ICD-10-CM

## 2018-05-16 DIAGNOSIS — I10 Essential (primary) hypertension: Secondary | ICD-10-CM

## 2018-05-16 DIAGNOSIS — I951 Orthostatic hypotension: Secondary | ICD-10-CM

## 2018-05-16 MED ORDER — RAMIPRIL 5 MG PO CAPS: 5.0000 mg | ORAL_CAPSULE | Freq: Every day | ORAL | 3 refills | Status: DC

## 2018-05-16 NOTE — Progress Notes (Signed)
Adrian Singh Date of Birth: 1934-05-27   History of Present Illness: Adrian Singh is seen today for evaluation of chest pain.   He has a history of orthostatic dizziness. He was admitted in July 3710 with an embolic stroke presumably due to paroxysmal Afib. He was anticoagulated with Eliquis. The patient developed tachycardia and HA on Eliquis that resolved when he was switched to Xarelto. Previous orthostatic dizziness improved with stopping diuretic and alpha blockers he was taking for enlarged prostate.   He was seen in the office on 08/10/2015 with shortness of breath with exertion. He had outpatient stress test done on 08/25/2015 which showed EF mildly decreased 45-54%, no significant ST elevation or depression during stress portion, overall considered low risk study, there was prior myocardial infarction with peri-infarct ischemia. The peri-infarct ischemia was felt to be new compared to the previous stress test, therefore outpatient cardiac catheterization was arranged. He underwent planned study on 09/22/2015 which showed 30% mid LAD lesion, 99% mid to distal left circumflex lesion, 50% OM 3 lesion. Due to poor support from right radial approach and concern for renal function, it was planned for staged PCI at a later time. He was admitted to [facility] on 10/06/15 for staged PCI/DES to mid LCx lesion that wa 99% stenosed. There was residual 30% mid LAD stenosis to be treated medically.  He was admitted in June 2017 with chest pain. Cardiac cath showed no new disease and prior stent was patent. Later when seen as an outpatient he developed orthostatic dizziness. maxzide was stopped and metoprolol was reduced with improvement.  When seen last year he reported marked worsening of orthostatic dizziness. Marland Kitchen He was back on Maxide and also taking Flomax for obstructive urinary symptoms. We stopped both of these medications and referred him to urology. He underwent laser TURP on 02/17/68 without complication. 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   He was recently seen by Dr. Silvio Pate yesterday and patient noted symptoms of chest pain. He states he has been having this pain continually for 2 weeks. It is located in the left precordium and left axillary region. It goes into his left face, chin and forehead. It is constant without relief. No relation to activity or deep breathing. Has never had this pain before. It is worse when he first gets up in the morning. No rash.    Current Outpatient Medications on File Prior to Visit  Medication Sig Dispense Refill  . atorvastatin (LIPITOR) 40 MG tablet Take 0.5 tablets (20 mg total) by mouth daily. 45 tablet 3  . BAYER MICROLET LANCETS lancets Use to test blood sugar once daily dx: E11.40 100 each 1  . Blood Glucose Monitoring Suppl (CONTOUR NEXT EZ MONITOR) w/Device KIT USE TO TEST BLOOD SUGAR ONCE DAILY. Dx Code E11.40 1 kit 0  . Cholecalciferol (VITAMIN D3) 2000 units TABS Take 2,000 Units by mouth daily.    Marland Kitchen docusate sodium (COLACE) 250 MG capsule Take 250 mg by mouth daily.    Marland Kitchen glimepiride (AMARYL) 4 MG tablet TAKE 1 TABLET BY MOUTH TWICE DAILY 180 tablet 3  . glucose blood (CONTOUR NEXT TEST) test strip Use to check blood sugar once a day. Dx Codes: E11.40, E11.65 100 each 3  . metFORMIN (GLUCOPHAGE) 500 MG tablet Take 1 tablet (500 mg total) by mouth 3 (three) times daily. 270 tablet 3  . metoprolol succinate (TOPROL-XL) 100 MG 24 hr tablet TAKE 1/2 TABLET  BY MOUTH ONCE DAILY (TAKE  WITH  OR  IMMEDIATELY  FOLLOWING  A  MEAL) 45 tablet 3  . Multiple Vitamin (MULTIVITAMIN WITH MINERALS) TABS tablet Take 1 tablet by mouth 2 (two) times a week. ONE-A-DAY FOR MEN    . Naproxen Sodium (ALEVE) 220 MG CAPS Take 220 mg by mouth daily.     . nitroGLYCERIN (NITROSTAT) 0.4 MG SL tablet Place 1 tablet (0.4 mg total) under the tongue every 5 (five) minutes as needed for chest pain. 25 tablet 0  . rivaroxaban  (XARELTO) 20 MG TABS tablet Take 1 tablet (20 mg total) by mouth daily with supper. 28 tablet 0  . tamsulosin (FLOMAX) 0.4 MG CAPS capsule TAKE 1 CAPSULE BY MOUTH ONCE DAILY 90 capsule 3  . traMADol (ULTRAM) 50 MG tablet TAKE 1 TABLET BY MOUTH THREE TIMES DAILY AS NEEDED 30 tablet 0   No current facility-administered medications on file prior to visit.     Allergies  Allergen Reactions  . Soma [Carisoprodol] Other (See Comments)    "did a number on me"  . Doxazosin Other (See Comments)    dizziness    Past Medical History:  Diagnosis Date  . Arthritis    "legs, back" (10/06/2015)  . Atrial fibrillation ([place])    x1  . CAD (coronary artery disease)    nonobstructive  . Cancer ([place])    skin cancer on ear (froze it off) and back (cut it off)  . Dysrhythmia   . Esophageal reflux    hx of  . History of kidney stones   . Hx of heart artery stent   . Hypertrophy of prostate without urinary obstruction and other lower urinary tract symptoms (LUTS)   . Osteoarthrosis, unspecified whether generalized or localized, unspecified site   . Stroke [facility] 03/2014   "had a series of mini strokes; maybe 4"; denies residual on 10/06/2015  . Thrombocytopenia ([place])   . Type II diabetes mellitus ([place])    type 2  . Unspecified essential hypertension     Past Surgical History:  Procedure Laterality Date  . BACK SURGERY     2001  . CARDIAC CATHETERIZATION  12/2007  . CARDIAC CATHETERIZATION N/A 09/22/2015   Procedure: Left Heart Cath and Coronary Angiography;  Surgeon: Lesly Pontarelli M Martinique, MD;  Location: [place] CV LAB;  Service: Cardiovascular;  Laterality: N/A;  . CARDIAC CATHETERIZATION N/A 10/06/2015   Procedure: Coronary Stent Intervention;  Surgeon: Tyshika Baldridge M Martinique, MD;  Location: [place] [facility] LAB;  Service: Cardiovascular;  Laterality: N/A;  . CARDIAC CATHETERIZATION N/A 02/29/2016   Procedure: Left Heart Cath and Coronary Angiography;  Surgeon: Jettie Booze, MD;  Location: [place] CV  LAB;  Service: Cardiovascular;  Laterality: N/A;  . COLONOSCOPY W/ BIOPSIES AND POLYPECTOMY    . CORONARY STENT PLACEMENT  10/06/2015   [name]  with DES  . EAR CYST EXCISION N/A 04/06/2015   Procedure: EXCISION OF SCALP CYST;  Surgeon: Donnie Mesa, MD;  Location: [place];  Service: General;  Laterality: N/A;  . ESOPHAGOGASTRODUODENOSCOPY (EGD) WITH ESOPHAGEAL DILATION  2001   with dilation  . LUMBAR DISC SURGERY  09/26/1999   "cleaned out arthritis and bone spurs"  . SHOULDER ARTHROSCOPY W/ ROTATOR CUFF REPAIR Left 2005  . THULIUM LASER TURP (TRANSURETHRAL RESECTION OF PROSTATE) N/A 01/21/2017   Procedure: Marcelino Duster LASER TURP (TRANSURETHRAL RESECTION OF PROSTATE) CAUTERIZATION OF BLADDER LESION;  Surgeon: Franchot Gallo, MD;  Location: [facility];  Service: Urology;  Laterality: N/A;  Social History   Tobacco Use  Smoking Status Former Smoker  . Years: 3.00  . Types: Cigarettes  Smokeless Tobacco Never Used  Tobacco Comment   " Quit smoking by age 89; was a someday smoker "    Social History   Substance and Sexual Activity  Alcohol Use No    Family History  Problem Relation Age of Onset  . Stroke Father   . Peripheral vascular disease Father        amputation  . Heart failure Mother        CHF  . Coronary artery disease Mother   . Heart attack Mother 9       Multiple  . COPD Brother   . Heart disease Brother   . Cancer Brother        Bone  . Lung cancer Sister     Review of Systems: The review of systems is as noted in HPI.  All other systems were reviewed and are negative.  Physical Exam: BP (!) 199/92   Pulse 74   Ht '5\' 10"'  (1.778 m)   Wt 203 lb 6.4 oz (92.3 kg)   BMI 29.18 kg/m   GENERAL:  Well appearing overweight WM in NAD HEENT:  PERRL, EOMI, sclera are clear. Oropharynx is clear. NECK:  No jugular venous distention, carotid upstroke brisk and symmetric, no bruits, no thyromegaly or adenopathy LUNGS:  Clear to auscultation bilaterally CHEST:  Tender to  palpatation in the left chest and lateral ribs. HEART:  RRR,  PMI not displaced or sustained,S1 and S2 within normal limits, no S3, no S4: no clicks, no rubs, no murmurs ABD:  Soft, nontender. BS +, no masses or bruits. No hepatomegaly, no splenomegaly EXT:  2 + pulses throughout, no edema, no cyanosis no clubbing SKIN:  Warm and dry.  No rashes NEURO:  Alert and oriented x 3. Cranial nerves II through XII intact. PSYCH:  Cognitively intact    LABORATORY DATA: Lab Results  Component Value Date   WBC 7.1 11/14/2017   HGB 11.8 (L) 11/14/2017   HCT 35.8 (L) 11/14/2017   PLT 162.0 11/14/2017   GLUCOSE 146 (H) 11/14/2017   CHOL 83 11/14/2017   TRIG 199.0 (H) 11/14/2017   HDL 32.50 (L) 11/14/2017   LDLDIRECT 77.0 03/28/2015   LDLCALC 11 11/14/2017   ALT 22 11/14/2017   AST 25 11/14/2017   NA 139 11/14/2017   K 4.7 11/14/2017   CL 103 11/14/2017   CREATININE 1.48 11/14/2017   BUN 22 11/14/2017   CO2 27 11/14/2017   TSH 3.33 08/26/2013   PSA 158.00 (H) 01/14/2017   INR 1.45 02/29/2016   HGBA1C 8.5 (A) 05/15/2018   MICROALBUR 17.2 (H) 05/23/2012   Ecg today shows NSR with low voltage. Old inferior/posterior infarct. No change I have personally reviewed and interpreted this study.  Assessment / Plan: 1. Orthostatic hypotension. . It is exacerbated by medication- especially diuretics and Flomax. Resolved after stopping these meds. Now s/p TURP.    2. CAD s/p DES of mid LCx in January 2017. No  angina. Repeat cardiac cath in June 2017 showed continued patency. Off antiplatelet therapy. Continue Xarelto.  3. Atypical chest pain and facial pain. Worse on palpation suggests this is more musculoskeletal. Does not sound at all cardiac. Recommend he take Tramadol and rest. If symptoms do not improve follow up with Dr. Silvio Pate  4. CKD stage 3.   5. Paroxysmal Afib. Continue metoprolol and Xarelto.   6. History  of CVA.  7. DM per  Dr Silvio Pate.  8. Prostate CA metastatic to bone-  thoracolumbar spine. On hormonal therapy  9. HTN poorly controlled. Exacerbated by pain. Will increase ramipril to 5 mg daily.   I will follow up in 6 months.

## 2018-05-16 NOTE — Patient Instructions (Signed)
Take tramadol for pain  We will increase ramipril to 5 mg daily  If this pain persists you need to check back with Dr. Silvio Pate

## 2018-05-26 ENCOUNTER — Encounter: Payer: Self-pay | Admitting: Cardiology

## 2018-05-28 ENCOUNTER — Other Ambulatory Visit: Payer: Self-pay | Admitting: Internal Medicine

## 2018-05-28 ENCOUNTER — Other Ambulatory Visit: Payer: Self-pay | Admitting: Cardiology

## 2018-05-28 NOTE — Telephone Encounter (Signed)
Last filled 03-24-18 #30 Last OV 05-15-18 Next OV 11-27-18

## 2018-06-02 ENCOUNTER — Ambulatory Visit (INDEPENDENT_AMBULATORY_CARE_PROVIDER_SITE_OTHER): Payer: Medicare Other

## 2018-06-02 DIAGNOSIS — Z23 Encounter for immunization: Secondary | ICD-10-CM

## 2018-06-02 NOTE — Progress Notes (Signed)
Administered flu vaccine to patient per his request. Vaccination administration documented in EHR. VIS given to patient.

## 2018-06-09 ENCOUNTER — Other Ambulatory Visit: Payer: Self-pay | Admitting: Internal Medicine

## 2018-06-09 ENCOUNTER — Ambulatory Visit: Payer: Medicare Other | Admitting: Cardiology

## 2018-06-13 DIAGNOSIS — C7951 Secondary malignant neoplasm of bone: Secondary | ICD-10-CM | POA: Diagnosis not present

## 2018-06-13 DIAGNOSIS — E1165 Type 2 diabetes mellitus with hyperglycemia: Secondary | ICD-10-CM | POA: Diagnosis not present

## 2018-06-13 DIAGNOSIS — C61 Malignant neoplasm of prostate: Secondary | ICD-10-CM | POA: Diagnosis not present

## 2018-06-16 ENCOUNTER — Telehealth: Payer: Self-pay

## 2018-06-16 NOTE — Telephone Encounter (Signed)
Pt walked in; has been having back and rt hip pain when on his feet; pt has CA of pelvis. Pt is taking Lupon and Xgeva for prostate and bone CA. Tramadol helps pt slightly but Aleve does not help pain at all. Pt was thinking Dr Silvio Pate had given pt injection into hip previously yrs ago. Dr Silvio Pate said would need US guided injection for that and he would not have done that. Pt is going to ck with his ortho and if needs appt to see Dr Silvio Pate (not for an injection; pt will cb for appt.) pt going on fishing trip this weekend. FYI to Dr Silvio Pate.

## 2018-06-16 NOTE — Telephone Encounter (Signed)
Might be related to his cancer as well--doubt injection indicated in that case Can certainly review his medications for pain to see if we can be more effective for this---if the ortho is not able to help out

## 2018-06-17 NOTE — Telephone Encounter (Signed)
Spoke to pt. He has an appointment 06-19-18 with his oncologist and will schedule a Bone Scan. He is taking 1 tramadol and 1 Aleve. He will call back if decides to make any med changes.

## 2018-06-19 ENCOUNTER — Ambulatory Visit: Payer: Medicare Other | Admitting: Radiation Oncology

## 2018-06-19 ENCOUNTER — Ambulatory Visit: Payer: Medicare Other

## 2018-06-24 ENCOUNTER — Other Ambulatory Visit: Payer: Self-pay

## 2018-06-24 ENCOUNTER — Encounter: Payer: Self-pay | Admitting: Radiation Oncology

## 2018-06-24 NOTE — Progress Notes (Signed)
GU Location of Tumor / Histology: prostatic adenocarcinoma  If Prostate Cancer, Gleason Score is (4 + 5) and PSA is (158) prior to TURP on 01/21/2017     Past/Anticipated interventions by urology, if any: TURP, [place], Lupron, Xgeva, bone scan, referral to radiation oncology for consideration of radiotherapy vs Xofigo to manage advancing disease.   Past/Anticipated interventions by medical oncology, if any: no  Weight changes, if any: no  Bowel/Bladder complaints, if any: Reports a weak urine stream at night. Reports a steady urine stream during the day. Reports nocturia every 2-4 hours. Denies dysuria, hematuria, urinary leakage or incontinence.  Nausea/Vomiting, if any: no  Pain issues, if any:  Reports right side pain that radiates to his right hip and down his right femur to the top of his knee. He describes this as a constant deep pain. Reports the pain becomes to severe at times his leg becomes weak and he must use a cane to ambulate. Denies numbness of right lower extremity. Reports ambulating and prolonged standing make the pain worse. Reports taking one tramadol tablet in the AM and one Aleve tablet before bed to manage the pain.   SAFETY ISSUES:  Prior radiation? no  Pacemaker/ICD? no  Possible current pregnancy? no  Is the patient on methotrexate? no  Current Complaints / other details:  84 year old male. Married. Retired. Resides in [place] with wife.

## 2018-06-25 ENCOUNTER — Ambulatory Visit
Admission: RE | Admit: 2018-06-25 | Discharge: 2018-06-25 | Disposition: A | Discharge status: Home / Self Care | Payer: Medicare Other | Source: Ambulatory Visit | Attending: Radiation Oncology | Admitting: Radiation Oncology

## 2018-06-25 ENCOUNTER — Telehealth: Payer: Self-pay

## 2018-06-25 ENCOUNTER — Other Ambulatory Visit: Payer: Self-pay

## 2018-06-25 ENCOUNTER — Encounter: Payer: Self-pay | Admitting: Radiation Oncology

## 2018-06-25 VITALS — BP 176/78 | HR 79 | Temp 98.4°F | Resp 20 | Ht 70.0 in | Wt 203.0 lb

## 2018-06-25 DIAGNOSIS — I2510 Atherosclerotic heart disease of native coronary artery without angina pectoris: Secondary | ICD-10-CM | POA: Diagnosis not present

## 2018-06-25 DIAGNOSIS — C61 Malignant neoplasm of prostate: Secondary | ICD-10-CM | POA: Insufficient documentation

## 2018-06-25 DIAGNOSIS — Z7901 Long term (current) use of anticoagulants: Secondary | ICD-10-CM | POA: Diagnosis not present

## 2018-06-25 DIAGNOSIS — Z87891 Personal history of nicotine dependence: Secondary | ICD-10-CM | POA: Diagnosis not present

## 2018-06-25 DIAGNOSIS — R9721 Rising PSA following treatment for malignant neoplasm of prostate: Secondary | ICD-10-CM | POA: Diagnosis not present

## 2018-06-25 DIAGNOSIS — Z7984 Long term (current) use of oral hypoglycemic drugs: Secondary | ICD-10-CM | POA: Insufficient documentation

## 2018-06-25 DIAGNOSIS — Z85828 Personal history of other malignant neoplasm of skin: Secondary | ICD-10-CM | POA: Diagnosis not present

## 2018-06-25 DIAGNOSIS — C7951 Secondary malignant neoplasm of bone: Secondary | ICD-10-CM

## 2018-06-25 DIAGNOSIS — Z79899 Other long term (current) drug therapy: Secondary | ICD-10-CM | POA: Insufficient documentation

## 2018-06-25 DIAGNOSIS — G893 Neoplasm related pain (acute) (chronic): Secondary | ICD-10-CM | POA: Diagnosis not present

## 2018-06-25 DIAGNOSIS — Z8673 Personal history of transient ischemic attack (TIA), and cerebral infarction without residual deficits: Secondary | ICD-10-CM | POA: Diagnosis not present

## 2018-06-25 HISTORY — DX: Unspecified malignant neoplasm of skin, unspecified: C44.90

## 2018-06-25 HISTORY — DX: Malignant neoplasm of prostate: C61

## 2018-06-25 NOTE — Telephone Encounter (Signed)
Spoke to patient Xarelto 20 mg samples left at [employer] office front desk.

## 2018-06-25 NOTE — Progress Notes (Signed)
Radiation Oncology         [phone] ________________________________  Initial outpatient Consultation  Name: Adrian Singh MRN: [id number]  Date: 06/25/2018  DOB: 04-Dec-1933  DT:OIZTIW, Theophilus Kinds, MD  Franchot Gallo, MD   REFERRING PHYSICIAN: Franchot Gallo, MD  DIAGNOSIS:     ICD-10-CM   1. Malignant neoplasm of prostate ([place]) C61   2. Bone metastases ([facility]) C79.51     HISTORY OF PRESENT ILLNESS: Adrian Singh is a 84 y.o. male with a diagnosis of metastatic prostate cancer. He presented to Dr. Diona Fanti in 12/2016 with urinary retention. He underwent TURP on 01/21/17 and was shown to have prostate cancer with a maximum Gleason score of 4+5 and PSA of 158. CT and bone scans for disease staging following his surgery revealed osseous metastases in the ribs, mid thoracic spine, pelvis and sternum. He was started on androgen deprivation therapy on 02/17/17 and has continued with Lupron injections every 3 to 4 months in addition to Tonsina monthly since that time. His most recent PSA on 05/12/18 was 1.14, increased from 0.88 on 01/08/18.  His most recent bone scan performed on 01/20/18 revealed most areas of previously noted radiotracer uptake have improved or appear similar to the prior exam. However, one area involving the right aspect of T12 was noted to have minimally increased radiotracer uptake and was recommended to have close monitoring.  Over the past 1 month, the patient reports progressively worsening pain in the right lower back radiating posteriorly into the buttocks and down the right lower extremity to his foot.  He reports occasional numbness in the right lower extremity but denies any difficulty with ambulation.  He reports the pain is aggravated with any weightbearing activities or sitting any period of time.  He denies any focal weakness or difficulty with bowel or bladder control.  The patient has kindly been referred today for discussion of potential palliative radiation  treatment options in the management of his low back/lower right extremity pain.   PREVIOUS RADIATION THERAPY: No  PAST MEDICAL HISTORY:  Past Medical History:  Diagnosis Date  . Arthritis    "legs, back" (10/06/2015)  . Atrial fibrillation ([place])    x1  . CAD (coronary artery disease)    nonobstructive  . Cancer ([place])    skin cancer on ear (froze it off) and back (cut it off)  . Dysrhythmia   . Esophageal reflux    hx of  . History of kidney stones   . Hx of heart artery stent   . Hypertrophy of prostate without urinary obstruction and other lower urinary tract symptoms (LUTS)   . Osteoarthrosis, unspecified whether generalized or localized, unspecified site   . Prostate cancer ([place])   . Skin cancer   . Stroke [facility]) 03/2014   "had a series of mini strokes; maybe 4"; denies residual on 10/06/2015  . Thrombocytopenia ([place])   . Type II diabetes mellitus ([place])    type 2  . Unspecified essential hypertension       PAST SURGICAL HISTORY: Past Surgical History:  Procedure Laterality Date  . BACK SURGERY     2001  . CARDIAC CATHETERIZATION  12/2007  . CARDIAC CATHETERIZATION N/A 09/22/2015   Procedure: Left Heart Cath and Coronary Angiography;  Surgeon: Peter M Martinique, MD;  Location: [facility];  Service: Cardiovascular;  Laterality: N/A;  . CARDIAC CATHETERIZATION N/A 10/06/2015   Procedure: Coronary Stent Intervention;  Surgeon: Peter M Martinique, MD;  Location: [facility];  Service: Cardiovascular;  Laterality: N/A;  . CARDIAC CATHETERIZATION N/A 02/29/2016   Procedure: Left Heart Cath and Coronary Angiography;  Surgeon: Jettie Booze, MD;  Location: [facility];  Service: Cardiovascular;  Laterality: N/A;  . COLONOSCOPY W/ BIOPSIES AND POLYPECTOMY    . CORONARY STENT PLACEMENT  10/06/2015   [name]  with DES  . EAR CYST EXCISION N/A 04/06/2015   Procedure: EXCISION OF SCALP CYST;  Surgeon: Donnie Mesa, MD;  Location: [facility];  Service: General;  Laterality:  N/A;  . ESOPHAGOGASTRODUODENOSCOPY (EGD) WITH ESOPHAGEAL DILATION  2001   with dilation  . LUMBAR DISC SURGERY  09/26/1999   "cleaned out arthritis and bone spurs"  . SHOULDER ARTHROSCOPY W/ ROTATOR CUFF REPAIR Left 2005  . THULIUM LASER TURP (TRANSURETHRAL RESECTION OF PROSTATE) N/A 01/21/2017   Procedure: Marcelino Duster LASER TURP (TRANSURETHRAL RESECTION OF PROSTATE) CAUTERIZATION OF BLADDER LESION;  Surgeon: Franchot Gallo, MD;  Location: [facility];  Service: Urology;  Laterality: N/A;    FAMILY HISTORY:  Family History  Problem Relation Age of Onset  . Stroke Father   . Peripheral vascular disease Father        amputation  . Heart failure Mother        CHF  . Coronary artery disease Mother   . Heart attack Mother 59       Multiple  . COPD Brother   . Heart disease Brother   . Cancer Brother        Bone  . Lung cancer Sister     SOCIAL HISTORY:  Social History   Socioeconomic History  . Marital status: Married    Spouse name: Not on file  . Number of children: 4  . Years of education: Not on file  . Highest education level: Not on file  Occupational History  . Occupation: Radiographer, 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[employer]    Employer: [employer]  . Financial resource strain: Not on file  . Food insecurity:    Worry: Not on file    Inability: Not on file  . Transportation needs:    Medical: Not on file    Non-medical: Not on file  Tobacco Use  . Smoking status: Former Smoker    Years: 3.00    Types: Cigarettes  . Smokeless tobacco: Never Used  . Tobacco comment: " Quit smoking by age 72; was a someday smoker "  Substance and Sexual Activity  . Alcohol use: No  . Drug use: No  . Sexual activity: Never  Lifestyle  . Physical activity:    Days per week: Not on file    Minutes per session: Not on file  . Stress: Not on file  Relationships  . Social connections:    Talks on phone: Not on file    Gets together: Not on file    Attends religious service: Not on file     Active member of club or organization: Not on file    Attends meetings of clubs or organizations: Not on file    Relationship status: Not on file  . Intimate partner violence:    Fear of current or ex partner: Not on file    Emotionally abused: Not on file    Physically abused: Not on file    Forced sexual activity: Not on file  Other Topics Concern  . Not on file  Social History Narrative   No living will   Plans wife and then children to make health  care decisions for him if unable   Would request at least attempts at resuscitation but no prolonged life support   Doesn't think he would want tube feeds if cognitively unaware    ALLERGIES: Soma [carisoprodol] and Doxazosin  MEDICATIONS:  Current Outpatient Medications  Medication Sig Dispense Refill  . atorvastatin (LIPITOR) 40 MG tablet Take 0.5 tablets (20 mg total) by mouth daily. 45 tablet 3  . BAYER MICROLET LANCETS lancets Use to test blood sugar once daily dx: E11.40 100 each 1  . Blood Glucose Monitoring Suppl (CONTOUR NEXT EZ MONITOR) w/Device KIT USE TO TEST BLOOD SUGAR ONCE DAILY. Dx Code E11.40 1 kit 0  . Cholecalciferol (VITAMIN D3) 2000 units TABS Take 2,000 Units by mouth daily.    Marland Kitchen docusate sodium (COLACE) 250 MG capsule Take 250 mg by mouth daily.    Marland Kitchen glimepiride (AMARYL) 4 MG tablet TAKE 1 TABLET BY MOUTH TWICE DAILY 180 tablet 3  . glucose blood (CONTOUR NEXT TEST) test strip Use to check blood sugar once a day. Dx Codes: E11.40, E11.65 100 each 3  . metFORMIN (GLUCOPHAGE) 500 MG tablet Take 1 tablet (500 mg total) by mouth 3 (three) times daily. 270 tablet 3  . metoprolol succinate (TOPROL-XL) 100 MG 24 hr tablet TAKE 1/2 TABLET BY MOUTH ONCE DAILY (TAKE  WITH  OR  IMMEDIATELY  FOLLOWING  A  MEAL) 45 tablet 3  . Multiple Vitamin (MULTIVITAMIN WITH MINERALS) TABS tablet Take 1 tablet by mouth 2 (two) times a week. ONE-A-DAY FOR MEN    . Naproxen Sodium (ALEVE) 220 MG CAPS Take 220 mg by mouth daily.     .  nitroGLYCERIN (NITROSTAT) 0.4 MG SL tablet Place 1 tablet (0.4 mg total) under the tongue every 5 (five) minutes as needed for chest pain. 25 tablet 0  . Pantothenic Acid 500 MG TABS Take by mouth.    . ramipril (ALTACE) 5 MG capsule Take 1 capsule (5 mg total) by mouth daily. (Patient taking differently: Take 5 mg by mouth 2 (two) times daily. ) 90 capsule 3  . rivaroxaban (XARELTO) 20 MG TABS tablet Take 1 tablet (20 mg total) by mouth daily with supper. 28 tablet 0  . tamsulosin (FLOMAX) 0.4 MG CAPS capsule TAKE 1 CAPSULE BY MOUTH ONCE DAILY 90 capsule 3  . traMADol (ULTRAM) 50 MG tablet TAKE 1 TABLET BY MOUTH THREE TIMES DAILY AS NEEDED 30 tablet 0   No current facility-administered medications for this encounter.     REVIEW OF SYSTEMS:  On review of systems, the patient reports that he is doing well overall. He denies any chest pain, shortness of breath, cough, fevers, chills, night sweats, unintended weight changes. He denies abdominal pain, nausea or vomiting. He reports a weak urine stream only at night and nocturia every 2-4 hours. He denies any other urinary symptoms. He reports right side pain that originates in the right low back and radiates to his right lateral hip, buttock, and down his right femur to the top of his knee and occasionally down to this right foot. He describes this as a constant deep pain with intermittent feeling of numbness. He reports that the pain is aggrevated with weightbearing and becomes so severe at times his leg becomes weak and he must use a cane to ambulate. He reports that ambulating and prolonged standing or sitting make the pain worse.  A complete review of systems is obtained and is otherwise negative.    PHYSICAL EXAM:  Wt Readings  from Last 3 Encounters:  06/25/18 203 lb (92.1 kg)  05/16/18 203 lb 6.4 oz (92.3 kg)  05/15/18 200 lb (90.7 kg)   Temp Readings from Last 3 Encounters:  06/25/18 98.4 F (36.9 C) (Oral)  05/15/18 97.9 F (36.6 C)    11/14/17 97.9 F (36.6 C) (Oral)   BP Readings from Last 3 Encounters:  06/25/18 (!) 176/78  05/16/18 (!) 199/92  05/15/18 (!) 152/76   Pulse Readings from Last 3 Encounters:  06/25/18 79  05/16/18 74  05/15/18 74    /10  In general this is a well appearing Caucasian gentleman in no acute distress. He is alert and oriented x4 and appropriate throughout the examination. HEENT reveals that the patient is normocephalic, atraumatic. EOMs are intact. PERRLA. Skin is intact without any evidence of gross lesions. Cardiovascular exam reveals a regular rate and rhythm, no clicks rubs or murmurs are auscultated. Chest is clear to auscultation bilaterally. Lymphatic assessment is performed and does not reveal any adenopathy in the cervical, supraclavicular, axillary, or inguinal chains. Abdomen has active bowel sounds in all quadrants and is intact. The abdomen is soft, non tender, non distended.  There is no tenderness to palpation over the spine at the level of T12.  He does have some tenderness to palpation over the paravertebral muscles on the right at the level of L5 as well as over the right SI joint.  The lower extremities are negative for pretibial pitting edema, deep calf tenderness, cyanosis or clubbing.There is decreased sensation to light touch in the lateral right calf region, but sensation is otherwise normal throughout the bilateral lower extremities.  Strength is 5 out of 5 and equal bilaterally in the lower extremities.   KPS = 80  100 - Normal; no complaints; no evidence of disease. 90   - Able to carry on normal activity; minor signs or symptoms of disease. 80   - Normal activity with effort; some signs or symptoms of disease. 19   - Cares for self; unable to carry on normal activity or to do active work. 60   - Requires occasional assistance, but is able to care for most of his personal needs. 50   - Requires considerable assistance and frequent medical care. 8   - Disabled;  requires special care and assistance. 37   - Severely disabled; hospital admission is indicated although death not imminent. 71   - Very sick; hospital admission necessary; active supportive treatment necessary. 10   - Moribund; fatal processes progressing rapidly. 0     - Dead  Karnofsky DA, Abelmann [place], Craver LS and Burchenal [facility] [phone]) The use of the nitrogen mustards in the palliative treatment of carcinoma: with particular reference to bronchogenic carcinoma Cancer 1 634-56  LABORATORY DATA:  Lab Results  Component Value Date   WBC 7.1 11/14/2017   HGB 11.8 (L) 11/14/2017   HCT 35.8 (L) 11/14/2017   MCV 85.2 11/14/2017   PLT 162.0 11/14/2017   Lab Results  Component Value Date   NA 139 11/14/2017   K 4.7 11/14/2017   CL 103 11/14/2017   CO2 27 11/14/2017   Lab Results  Component Value Date   ALT 22 11/14/2017   AST 25 11/14/2017   ALKPHOS 49 11/14/2017   BILITOT 0.5 11/14/2017     RADIOGRAPHY: No results found.    IMPRESSION/PLAN: 1. 84 y.o. gentleman with Stage IV metastatic adenocarcinoma of the prostate with osseous metastases to the  skeleton.  We discussed the  patient's workup and outlined the nature of metastatic prostate cancer in this setting.  We reviewed his previous bone scan imaging from 01/2017 and 01/2018 which do not show a true correlate with his clinical presentation.  Therefore, we recommended he have further imaging with CT of the abdomen/pelvis to further evaluate the source of his symptoms. Pending those findings, we will plan to meet back with the patient and proceed with treatment planning accordingly, if indicated. We discussed general treatment, highlighting the role of palliative radiotherapy in the management.  We discussed the available radiation techniques, and focused on the details of logistics and delivery of a 10 day course of daily radiotherapy for palliation of his pain. We reviewed the anticipated acute and late sequelae associated with  radiation in this setting.  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  We would be happy to revisit this option in the future once his current acute RLE pain has been addressed and resolved.  The patient was encouraged to ask questions that were answered to his satisfaction.  At the conclusion of our conversation, the patient elects to proceed with further imaging with CT A/P to further evaluate the source of his pain.  We will share this information with Dr. Diona Fanti and work to coordinate his imaging to be performed at [facility] urology.  We will plan to meet back with the patient following his scan and pending there is an identifiable, cancer related source of his pain, we would anticipate offering site specific palliative radiotherapy delivered over the course of 10 daily treatments.  The patient and his family appear to have a good understanding of our recommendations and are in agreement with the stated plan.  They know to call at any time in the interim with any questions or concerns.   Nicholos Johns, PA-C    Tyler Pita, MD  [facility] Direct Dial: [phone]  Fax: [phone] [email or web address]  Skype  [email or web address]  This document serves as a record of services personally performed by [employer], PA-C and Dr. Tammi Klippel. It was created on their behalf by Wilburn Mylar, a trained medical scribe. The creation of this record is based on the scribe's personal observations and the provider's statements to them. This document has been checked and approved by the attending provider.

## 2018-06-25 NOTE — Progress Notes (Signed)
See progress note under physician encounter. 

## 2018-06-27 ENCOUNTER — Ambulatory Visit: Payer: Medicare Other | Admitting: Family Medicine

## 2018-06-27 ENCOUNTER — Telehealth: Payer: Self-pay | Admitting: Internal Medicine

## 2018-06-27 ENCOUNTER — Telehealth: Payer: Self-pay | Admitting: *Deleted

## 2018-06-27 DIAGNOSIS — C61 Malignant neoplasm of prostate: Secondary | ICD-10-CM | POA: Diagnosis not present

## 2018-06-27 NOTE — Telephone Encounter (Signed)
CALLED PATIENT TO INFORM OF [facility] APPT. FOR  07-02-18 - 4 PM FOR 4:30 PM APPT., SPOKE WITH PATIENT AND HE IS AWARE OF THIS APPT.

## 2018-06-27 NOTE — Telephone Encounter (Signed)
Copied from [place] [phone]. Topic: Quick Communication - See Telephone Encounter >> Jun 27, 2018  9:44 AM Adrian Singh wrote: CRM for notification. See Telephone encounter for: 06/27/18.  Patient states the traMADol (ULTRAM) 50 MG tablet is not really helping his hip pain. He said he does not know what to do, he thinks he may need something different. He would like Dr Alla German nurse to call him back.

## 2018-06-27 NOTE — Telephone Encounter (Signed)
I spoke with pt; rt hip pain is not helped by tramadol. No known injury; pt has been having rt hip pain that runs down leg to knee for 6 months but has worsened in the last month. Pt has been diagnosed with prostate and bone CA. Pt was advised by urologist to contact PCP for pain med. Pt has not been seen for hip pain and Dr Silvio Pate does not return until 07/02/18. Pt scheduled 30' appt with Dr Glori Bickers 06/27/18 at 4:15. (pt has appt in [place] and this is first available appt that pt could schedule). FYI to Dr Glori Bickers.

## 2018-06-27 NOTE — Telephone Encounter (Signed)
Pt cancelled appt with Dr. Glori Bickers because he said urologist did send him in another pain med. He will give it a few days to see if it helps, but he doesn't need anything on our end. FYI to Dr. Silvio Pate so he is aware. (pt not sure what pain med because he hasn't picked it up yet)

## 2018-06-27 NOTE — Telephone Encounter (Signed)
I think he takes tramadol 1 pill bid - correct me if I am wrong  If this medicine does not overly sedate him or make him dizzy - we could try increasing to 2 pills at at time (has he ever tried this? ) Please let me know  I will cc to PCP who is not in the office

## 2018-06-28 NOTE — Telephone Encounter (Signed)
Unfortunately, this is likely due to a bone met. Please check on him next week to see how he is doing

## 2018-06-30 ENCOUNTER — Other Ambulatory Visit: Payer: Self-pay | Admitting: Internal Medicine

## 2018-06-30 DIAGNOSIS — C61 Malignant neoplasm of prostate: Secondary | ICD-10-CM | POA: Diagnosis not present

## 2018-07-01 ENCOUNTER — Telehealth: Payer: Self-pay | Admitting: Urology

## 2018-07-01 NOTE — Telephone Encounter (Signed)
Last filled 05-28-18 #30 Last filled 05-15-18 Next OV 11-27-18  Forward to [facility] in Dr Alla German absence.

## 2018-07-01 NOTE — Telephone Encounter (Signed)
I called and spoke with the patient regarding his recent CT abdomen and pelvis results.  His imaging was reviewed with Dr. Tammi Klippel and unfortunately does not show any significant metastatic disease in the spine to correlate with his symptoms originating at the level of L5.  There is a small subcentimeter sclerotic lesion in the right iliac bone but is not felt likely to be the source of his pain.  He does have significant degenerative changes throughout the spine and felt to be more likely the cause of his pain.  He has had prior back surgery with Dr. Jenean Lindau in [place] in the past.  At this point, we would not recommend moving forward with palliative radiotherapy due to limited confidence that we could significantly improve his current symptoms.  I have discussed this with the patient and advised him to call if his symptoms progressively worsen and we would consider proceeding with dedicated spine imaging with an MRI of the lumbar spine for further evaluation.  He appears to have a good understanding of these recommendations and is in agreement and comfortable with the above-stated plan.  I will share this information with Dr. Diona Fanti and we will continue to follow expectantly.   Nicholos Johns, MMS, PA-C [place] at [place]: [phone]  Fax: [phone]

## 2018-07-01 NOTE — Telephone Encounter (Signed)
Left a message for pt to call back so I can see how he is feeling and if the new pain medication was working.

## 2018-07-02 ENCOUNTER — Inpatient Hospital Stay
Admission: RE | Admit: 2018-07-02 | Discharge: 2018-07-02 | Disposition: A | Discharge status: Home / Self Care | Payer: Self-pay | Source: Ambulatory Visit | Attending: Urology | Admitting: Urology

## 2018-07-14 ENCOUNTER — Encounter

## 2018-07-14 ENCOUNTER — Ambulatory Visit: Payer: Medicare Other

## 2018-07-14 ENCOUNTER — Ambulatory Visit: Payer: Medicare Other | Admitting: Radiation Oncology

## 2018-07-15 DIAGNOSIS — C7951 Secondary malignant neoplasm of bone: Secondary | ICD-10-CM | POA: Diagnosis not present

## 2018-07-15 DIAGNOSIS — C61 Malignant neoplasm of prostate: Secondary | ICD-10-CM | POA: Diagnosis not present

## 2018-07-21 ENCOUNTER — Telehealth: Payer: Self-pay | Admitting: Cardiology

## 2018-07-21 NOTE — Telephone Encounter (Signed)
New message Patient calling the office for samples of medication:   1.  What medication and dosage are you requesting samples for?rivaroxaban (XARELTO) 20 MG TABS tablet  2.  Are you currently out of this medication? no

## 2018-07-21 NOTE — Telephone Encounter (Signed)
Medication samples have been provided to the patient.  Drug name: Xarelto 20 mg   Qty: 14 tablets    LOT: [id number]  Exp.Date: 10/21  Samples left at front desk for patient pick-up. Patient notified.

## 2018-07-28 DIAGNOSIS — E1165 Type 2 diabetes mellitus with hyperglycemia: Secondary | ICD-10-CM | POA: Diagnosis not present

## 2018-08-06 ENCOUNTER — Other Ambulatory Visit: Payer: Self-pay | Admitting: Internal Medicine

## 2018-08-07 NOTE — Telephone Encounter (Signed)
Last filled 10-16-1 #30 Last OV 05-15-18 Next OV 11-27-18 [place]

## 2018-08-13 ENCOUNTER — Telehealth: Payer: Self-pay | Admitting: Cardiology

## 2018-08-13 DIAGNOSIS — C61 Malignant neoplasm of prostate: Secondary | ICD-10-CM | POA: Diagnosis not present

## 2018-08-13 DIAGNOSIS — C7951 Secondary malignant neoplasm of bone: Secondary | ICD-10-CM | POA: Diagnosis not present

## 2018-08-13 NOTE — Telephone Encounter (Signed)
Returned call to patient Xarelto 20 mg samples left at [name] office front desk. 

## 2018-08-13 NOTE — Telephone Encounter (Signed)
Patient calling the office for samples of medication:   1.  What medication and dosage are you requesting samples for? Xarelto  2.  Are you currently out of this medication? Just a few left   

## 2018-09-05 DIAGNOSIS — C61 Malignant neoplasm of prostate: Secondary | ICD-10-CM | POA: Diagnosis not present

## 2018-09-12 ENCOUNTER — Telehealth: Payer: Self-pay | Admitting: Cardiology

## 2018-09-12 DIAGNOSIS — R9721 Rising PSA following treatment for malignant neoplasm of prostate: Secondary | ICD-10-CM | POA: Diagnosis not present

## 2018-09-12 DIAGNOSIS — C7951 Secondary malignant neoplasm of bone: Secondary | ICD-10-CM | POA: Diagnosis not present

## 2018-09-12 DIAGNOSIS — C61 Malignant neoplasm of prostate: Secondary | ICD-10-CM | POA: Diagnosis not present

## 2018-09-12 NOTE — Telephone Encounter (Signed)
Medication samples have been provided to the patient.  Drug name: Xarelto 20mg   Qty: 21 LOT:  [id number] Exp.Date: 06/21  Samples left at front desk for patient pick-up. Patient notified.

## 2018-09-12 NOTE — Telephone Encounter (Signed)
New message   Patient calling the office for samples of medication:   1.  What medication and dosage are you requesting samples for?  rivaroxaban (XARELTO) 20 MG TABS tablet     2.  Are you currently out of this medication? No

## 2018-09-25 ENCOUNTER — Encounter: Payer: Self-pay | Admitting: Oncology

## 2018-09-25 ENCOUNTER — Telehealth: Payer: Self-pay | Admitting: Oncology

## 2018-09-25 NOTE — Telephone Encounter (Signed)
Received a new referral from Dr. Diona Fanti from [facility] for bladder cancer. Pt has been scheduled to see Dr. Alen Blew on 1/23 at [place] the pt a vm to return my call. Letter mailed to the pt and faxed to the referring to notify the pt.

## 2018-10-08 ENCOUNTER — Other Ambulatory Visit: Payer: Self-pay | Admitting: Cardiology

## 2018-10-08 MED ORDER — RIVAROXABAN 20 MG PO TABS: 20.0000 mg | ORAL_TABLET | Freq: Every day | ORAL | 3 refills | Status: DC

## 2018-10-08 MED ORDER — RIVAROXABAN 20 MG PO TABS: 20.0000 mg | ORAL_TABLET | Freq: Every day | ORAL | 0 refills | Status: DC

## 2018-10-08 NOTE — Telephone Encounter (Signed)
Pt aware to pick up samples at front desk

## 2018-10-08 NOTE — Telephone Encounter (Signed)
Patient calling the office for samples of medication:   1.  What medication and dosage are you requesting samples for? xarelto  20 mg  2.  Are you currently out of this medication? Patient has one left

## 2018-10-09 ENCOUNTER — Inpatient Hospital Stay: Payer: Medicare Other | Attending: Oncology | Admitting: Oncology

## 2018-10-09 ENCOUNTER — Telehealth: Payer: Self-pay | Admitting: Pharmacist

## 2018-10-09 ENCOUNTER — Telehealth: Payer: Self-pay | Admitting: Oncology

## 2018-10-09 ENCOUNTER — Encounter: Payer: Self-pay | Admitting: Medical Oncology

## 2018-10-09 VITALS — BP 155/77 | HR 70 | Temp 97.5°F | Resp 17 | Ht 70.0 in | Wt 202.6 lb

## 2018-10-09 DIAGNOSIS — E119 Type 2 diabetes mellitus without complications: Secondary | ICD-10-CM | POA: Diagnosis not present

## 2018-10-09 DIAGNOSIS — Z79899 Other long term (current) drug therapy: Secondary | ICD-10-CM

## 2018-10-09 DIAGNOSIS — C61 Malignant neoplasm of prostate: Secondary | ICD-10-CM

## 2018-10-09 DIAGNOSIS — Z7984 Long term (current) use of oral hypoglycemic drugs: Secondary | ICD-10-CM | POA: Diagnosis not present

## 2018-10-09 DIAGNOSIS — Z87891 Personal history of nicotine dependence: Secondary | ICD-10-CM

## 2018-10-09 DIAGNOSIS — M25559 Pain in unspecified hip: Secondary | ICD-10-CM | POA: Diagnosis not present

## 2018-10-09 MED ORDER — ABIRATERONE ACETATE 250 MG PO TABS: 1000.0000 mg | ORAL_TABLET | Freq: Every day | ORAL | 0 refills | Status: DC

## 2018-10-09 MED ORDER — PREDNISONE 5 MG PO TABS: 5.0000 mg | ORAL_TABLET | Freq: Every day | ORAL | 3 refills | Status: DC

## 2018-10-09 NOTE — Telephone Encounter (Signed)
Oral Chemotherapy Pharmacist Encounter   I spoke with patient, spouse, and daughter, in exam room for overview of: Zytiga (abiraterone) for the treatment of metastatic, castration resistant prostate cancer in conjunction with prednisone and androgen deprivation therapy (Lupron injections), planned duration until disease progression or unacceptable toxicity.  Counseled patient on administration, dosing, side effects, monitoring, drug-food interactions, safe handling, storage, and disposal.  Patient will take Zytiga 250mg  tablets, 4 tablets (1000mg ) by mouth once daily on an empty stomach, 1 hour before or 2 hours after a meal.  Patient states he will take his Zyitga first thing in the morning and will wait at least 1 hour before eating.  Patient will take prednisone 5mg  tablet, 1 tablet by mouth one daily with breakfast.  Zytiga start date: TBD, pending medication acquisition  Adverse effects include but are not limited to: peripheral edema, GI upset, hypertension, hot flashes, fatigue, and arthralgias.    Prednisone prescription has been sent to [facility] in [place]. Patient will obtain prednisone and knows to start prednisone on the same day as Zytiga start.  Reviewed with patient importance of keeping a medication schedule and plan for any missed doses.  Mr. Orrego voiced understanding and appreciation.   All questions answered. Medication reconciliation performed and medication/allergy list updated.  Patient informed about medication interaction with his metoprolol. He will continue to monitor his blood pressure and heart rate and alert the office if these decrease substantially.  Patient informed insurance authorization will be submitted. Once approved test claim will be run at the pharmacy to obtain copayment information. We extensively discussed medicare copayment and possibility for high copayment. We discussed options for copayment assistance including copayment  grant foundations and manufacturer compassionate use program. Patient instructed that if they are enrolled into manufacturer compassionate use program that medication will be shipped directly to their home from dispensing pharmacy of program's choosing.  We will follow-up with patient once insurance authorization is obtained and copayment is known.  Patient knows to call the office with questions or concerns.  Oral Oncology Clinic will continue to follow.  Johny Drilling, PharmD, BCPS, BCOP  10/09/2018 3:43 PM Oral Oncology Clinic [phone]

## 2018-10-09 NOTE — Telephone Encounter (Signed)
Oral Oncology Pharmacist Encounter  Received new prescription for Zytiga (abiraterone) for the treatment of metastatic, castration resistant prostate cancer in conjunction with prednisone and androgen deprivation therapy (Lupron injections), planned duration until disease progression or unacceptable toxicity.  Original diagnosis in April 2018, patient noted to have bone metastases at that time. He was started on androgen deprivation therapy and experienced a decline in his PSA. Last PSA result in December 2019 shows castration resistance.  Labs from [facility] urology performed on 06/27/2018 assessed, okay for treatment.  Current medication list in Epic reviewed, DDI with Zytiga and metoprolol identified:  Category D interaction: Zytiga is an inhibitor of CYP 2D6 thereby leading to decreased metabolism and increase systemic exposure to patient's metoprolol.  Blood pressures and heart rates reviewed, most BP readings above normal limits, HR's in safe range for Zytiga initiation.  No change to current therapy is indicated at this time.  Prescription has been e-scribed to the [facility] for benefits analysis and approval.  Oral Oncology Clinic will continue to follow for insurance authorization, copayment issues, initial counseling and start date.  Johny Drilling, PharmD, BCPS, BCOP  10/09/2018 3:37 PM Oral Oncology Clinic [phone]

## 2018-10-09 NOTE — Telephone Encounter (Signed)
Scheduled appt per 01/23 los. ° °Printed calendar and avs. °

## 2018-10-09 NOTE — Progress Notes (Signed)
Introduced myself to Mr. Siefert and his family as the prostate nurse navigator and my role. He was diagnosed with prostate cancer in April 2018. He started Lupron and Xgeva and has done well but now with a rising PSA. His most recent PSA was 3.09 increasing from 1.14 in August. Per Dr. Alen Blew he will continue Lupron but add Zytiga and prednisone. I gave him Zytiga patient education and reviewed. We discussed the process of obtaining the drug and the cost. I informed them that  Evelena Peat our oral chemo pharmacist will meet with them today to  discuss insurance and patient assistance. He states he is getting his [place] thru a grant and will need assistance paying for Zytiga. I gave them my business card and asked them to call me with questions or concerns. He voiced understanding.

## 2018-10-09 NOTE — Progress Notes (Signed)
Reason for the request: Prostate cancer  HPI: I was asked by Dr. Diona Fanti to evaluate Adrian Singh for diagnosis of prostate cancer.  He is an 85 year old man with a history of coronary disease, diabetes and prostate cancer that was diagnosed in April 2018.  At that time he was found to have an elevated PSA of 158 and biopsy showed a Gleason score of 4+5 = 9 prostate cancer.  He had advanced disease at presentation with bone involvement.  He was started on androgen deprivation therapy which she continues to receive under the care of Dr. Diona Fanti.  He has been receiving Lupron every 3 months and Xgeva concomitantly.  He had an excellent PSA response initially PSA nadir down to 0.88 and April 2019.  In August 2019 his PSA was 1.14 and in December 2019 was up to 3.29.  Staging work-up including a CT scan in October 2019 showed multiple bone lesions in the spine and pelvis which are similar and appeared more sclerotic.  No visceral metastasis noted at the time.  Bone scan obtained in May 2019 showed improvement of his bone disease compared to initial bone scan in May 2018.  Medically, he reports increased hip pain and was evaluated by Dr. Tammi Klippel in October 2019 and felt that his pain is likely related to arthritis rather than progression of disease.  He is currently using Tylenol and Ultram as needed with pain under reasonable control.  He remains ambulatory and continues to drive.  He denies any other complaints related to his disease at this time.  His quality of life remains maintained.  He does not report any headaches, blurry vision, syncope or seizures. Does not report any fevers, chills or sweats.  Does not report any cough, wheezing or hemoptysis.  Does not report any chest pain, palpitation, orthopnea or leg edema.  Does not report any nausea, vomiting or abdominal pain.  Does not report any constipation or diarrhea.  Does not report any skeletal complaints.    Does not report frequency, urgency or  hematuria.  Does not report any skin rashes or lesions. Does not report any heat or cold intolerance.  Does not report any lymphadenopathy or petechiae.  Does not report any anxiety or depression.  Remaining review of systems is negative.    Past Medical History:  Diagnosis Date  . Arthritis    "legs, back" (10/06/2015)  . Atrial fibrillation ([place])    x1  . CAD (coronary artery disease)    nonobstructive  . Cancer ([place])    skin cancer on ear (froze it off) and back (cut it off)  . Dysrhythmia   . Esophageal reflux    hx of  . History of kidney stones   . Hx of heart artery stent   . Hypertrophy of prostate without urinary obstruction and other lower urinary tract symptoms (LUTS)   . Osteoarthrosis, unspecified whether generalized or localized, unspecified site   . Prostate cancer ([place])   . Skin cancer   . Stroke [facility]) 03/2014   "had a series of mini strokes; maybe 4"; denies residual on 10/06/2015  . Thrombocytopenia ([place])   . Type II diabetes mellitus ([place])    type 2  . Unspecified essential hypertension   :  Past Surgical History:  Procedure Laterality Date  . BACK SURGERY     2001  . CARDIAC CATHETERIZATION  12/2007  . CARDIAC CATHETERIZATION N/A 09/22/2015   Procedure: Left Heart Cath and Coronary Angiography;  Surgeon: Peter M Martinique, MD;  Location: [place] CV LAB;  Service: Cardiovascular;  Laterality: N/A;  . CARDIAC CATHETERIZATION N/A 10/06/2015   Procedure: Coronary Stent Intervention;  Surgeon: Peter M Martinique, MD;  Location: [place] CV LAB;  Service: Cardiovascular;  Laterality: N/A;  . CARDIAC CATHETERIZATION N/A 02/29/2016   Procedure: Left Heart Cath and Coronary Angiography;  Surgeon: Jettie Booze, MD;  Location: [place] CV LAB;  Service: Cardiovascular;  Laterality: N/A;  . COLONOSCOPY W/ BIOPSIES AND POLYPECTOMY    . CORONARY STENT PLACEMENT  10/06/2015   [name]  with DES  . EAR CYST EXCISION N/A 04/06/2015   Procedure: EXCISION OF SCALP CYST;   Surgeon: Donnie Mesa, MD;  Location: [place];  Service: General;  Laterality: N/A;  . ESOPHAGOGASTRODUODENOSCOPY (EGD) WITH ESOPHAGEAL DILATION  2001   with dilation  . LUMBAR DISC SURGERY  09/26/1999   "cleaned out arthritis and bone spurs"  . SHOULDER ARTHROSCOPY W/ ROTATOR CUFF REPAIR Left 2005  . THULIUM LASER TURP (TRANSURETHRAL RESECTION OF PROSTATE) N/A 01/21/2017   Procedure: Marcelino Duster LASER TURP (TRANSURETHRAL RESECTION OF PROSTATE) CAUTERIZATION OF BLADDER LESION;  Surgeon: Franchot Gallo, MD;  Location: [facility];  Service: Urology;  Laterality: N/A;  :   Current Outpatient Medications:  .  abiraterone acetate (ZYTIGA) 250 MG tablet, Take 4 tablets (1,000 mg total) by mouth daily. Take on an empty stomach 1 hour before or 2 hours after a meal, Disp: 120 tablet, Rfl: 0 .  atorvastatin (LIPITOR) 40 MG tablet, Take 0.5 tablets (20 mg total) by mouth daily., Disp: 45 tablet, Rfl: 3 .  BAYER MICROLET LANCETS lancets, Use to test blood sugar once daily dx: E11.40, Disp: 100 each, Rfl: 1 .  Blood Glucose Monitoring Suppl (CONTOUR NEXT EZ MONITOR) w/Device KIT, USE TO TEST BLOOD SUGAR ONCE DAILY. Dx Code E11.40, Disp: 1 kit, Rfl: 0 .  Cholecalciferol (VITAMIN D3) 2000 units TABS, Take 2,000 Units by mouth daily., Disp: , Rfl:  .  docusate sodium (COLACE) 250 MG capsule, Take 250 mg by mouth daily., Disp: , Rfl:  .  glimepiride (AMARYL) 4 MG tablet, TAKE 1 TABLET BY MOUTH TWICE DAILY, Disp: 180 tablet, Rfl: 3 .  glucose blood (CONTOUR NEXT TEST) test strip, Use to check blood sugar once a day. Dx Codes: E11.40, E11.65, Disp: 100 each, Rfl: 3 .  metFORMIN (GLUCOPHAGE) 500 MG tablet, Take 1 tablet (500 mg total) by mouth 3 (three) times daily., Disp: 270 tablet, Rfl: 3 .  metoprolol succinate (TOPROL-XL) 100 MG 24 hr tablet, TAKE 1/2 TABLET BY MOUTH ONCE DAILY (TAKE  WITH  OR  IMMEDIATELY  FOLLOWING  A  MEAL), Disp: 45 tablet, Rfl: 3 .  Multiple Vitamin (MULTIVITAMIN WITH MINERALS) TABS tablet, Take  1 tablet by mouth 2 (two) times a week. ONE-A-DAY FOR MEN, Disp: , Rfl:  .  Naproxen Sodium (ALEVE) 220 MG CAPS, Take 220 mg by mouth daily. , Disp: , Rfl:  .  nitroGLYCERIN (NITROSTAT) 0.4 MG SL tablet, Place 1 tablet (0.4 mg total) under the tongue every 5 (five) minutes as needed for chest pain., Disp: 25 tablet, Rfl: 0 .  Pantothenic Acid 500 MG TABS, Take by mouth., Disp: , Rfl:  .  predniSONE (DELTASONE) 5 MG tablet, Take 1 tablet (5 mg total) by mouth daily with breakfast., Disp: 90 tablet, Rfl: 3 .  ramipril (ALTACE) 5 MG capsule, Take 1 capsule (5 mg total) by mouth daily. (Patient taking differently: Take 5 mg by mouth 2 (two) times daily. ), Disp:  90 capsule, Rfl: 3 .  rivaroxaban (XARELTO) 20 MG TABS tablet, Take 1 tablet (20 mg total) by mouth daily with supper., Disp: 28 tablet, Rfl: 0 .  tamsulosin (FLOMAX) 0.4 MG CAPS capsule, TAKE 1 CAPSULE BY MOUTH ONCE DAILY, Disp: 90 capsule, Rfl: 3 .  traMADol (ULTRAM) 50 MG tablet, TAKE 1 TABLET BY MOUTH THREE TIMES DAILY AS NEEDED, Disp: 30 tablet, Rfl: 0:  Allergies  Allergen Reactions  . Soma [Carisoprodol] Other (See Comments)    "did a number on me"  . Doxazosin Other (See Comments)    dizziness  :  Family History  Problem Relation Age of Onset  . Stroke Father   . Peripheral vascular disease Father        amputation  . Heart failure Mother        CHF  . Coronary artery disease Mother   . Heart attack Mother 77       Multiple  . COPD Brother   . Heart disease Brother   . Cancer Brother        Bone  . Lung cancer Sister   :  Social History   Socioeconomic History  . Marital status: Married    Spouse name: Not on file  . Number of children: 4  . Years of education: Not on file  . Highest education level: Not on file  Occupational History  . Occupation: Radiographer, 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[employer]    Employer: [employer]  . Financial resource strain: Not on file  . Food insecurity:    Worry: Not on file     Inability: Not on file  . Transportation needs:    Medical: Not on file    Non-medical: Not on file  Tobacco Use  . Smoking status: Former Smoker    Years: 3.00    Types: Cigarettes  . Smokeless tobacco: Never Used  . Tobacco comment: " Quit smoking by age 49; was a someday smoker "  Substance and Sexual Activity  . Alcohol use: No  . Drug use: No  . Sexual activity: Never  Lifestyle  . Physical activity:    Days per week: Not on file    Minutes per session: Not on file  . Stress: Not on file  Relationships  . Social connections:    Talks on phone: Not on file    Gets together: Not on file    Attends religious service: Not on file    Active member of club or organization: Not on file    Attends meetings of clubs or organizations: Not on file    Relationship status: Not on file  . Intimate partner violence:    Fear of current or ex partner: Not on file    Emotionally abused: Not on file    Physically abused: Not on file    Forced sexual activity: Not on file  Other Topics Concern  . Not on file  Social History Narrative   No living will   Plans wife and then children to make health care decisions for him if unable   Would request at least attempts at resuscitation but no prolonged life support   Doesn't think he would want tube feeds if cognitively unaware  :  Pertinent items are noted in HPI.  Exam: Blood pressure (!) 155/77, pulse 70, temperature (!) 97.5 F (36.4 C), temperature source Oral, resp. rate 17, height '5\' 10"'  (1.778 m), weight 202 lb 9.6 oz (91.9 kg), SpO2 100 %.  ECOG 1 General appearance: alert and cooperative appeared without distress. Head: atraumatic without any abnormalities. Eyes: conjunctivae/corneas clear. PERRL.  Sclera anicteric. Throat: lips, mucosa, and tongue normal; without oral thrush or ulcers. Resp: clear to auscultation bilaterally without rhonchi, wheezes or dullness to percussion. Cardio: regular rate and rhythm, S1, S2 normal, no  murmur, click, rub or gallop GI: soft, non-tender; bowel sounds normal; no masses,  no organomegaly Skin: Skin color, texture, turgor normal. No rashes or lesions Lymph nodes: Cervical, supraclavicular, and axillary nodes normal. Neurologic: Grossly normal without any motor, sensory or deep tendon reflexes. Musculoskeletal: No joint deformity or effusion.  CBC    Component Value Date/Time   WBC 7.1 11/14/2017 1613   RBC 4.21 (L) 11/14/2017 1613   HGB 11.8 (L) 11/14/2017 1613   HCT 35.8 (L) 11/14/2017 1613   PLT 162.0 11/14/2017 1613   MCV 85.2 11/14/2017 1613   MCH 28.1 01/14/2017 1349   MCHC 33.0 11/14/2017 1613   RDW 14.0 11/14/2017 1613   LYMPHSABS 1.5 06/05/2016 0918   MONOABS 0.8 06/05/2016 0918   EOSABS 0.1 06/05/2016 0918   BASOSABS 0.0 06/05/2016 0918     Chemistry      Component Value Date/Time   NA 139 11/14/2017 1613   K 4.7 11/14/2017 1613   CL 103 11/14/2017 1613   CO2 27 11/14/2017 1613   BUN 22 11/14/2017 1613   CREATININE 1.48 11/14/2017 1613   CREATININE 1.57 (H) 10/05/2015 1047      Component Value Date/Time   CALCIUM 9.8 11/14/2017 1613   ALKPHOS 49 11/14/2017 1613   AST 25 11/14/2017 1613   ALT 22 11/14/2017 1613   BILITOT 0.5 11/14/2017 1613       Assessment and Plan:   85 year old man with the following:  1.  Castration-resistant prostate cancer with metastatic disease to the bone diagnosed in April 2018.  He initially presented with metastatic disease to the bone that is castration sensitive but recently developed a rise in his PSA despite androgen deprivation that was started at the time.  His PSA in December 2019 was 3.29 which increased from 1.14 in August 2019.  This is in the setting of a castrate level testosterone.  The natural course of this disease was discussed today and treatment options were reviewed.  He has clear castration resistant disease with disease predominantly to the bone.  Treatment options at this time include Nicki Reaper, systemic chemotherapy, Trudi Ida among others.  Risks and benefits of all these options were reviewed today and given the fact that he is asymptomatic I have recommended proceeding with Zytiga at this time.  Complication associated with this medication include nausea, fatigue, hypertension and adrenal insufficiency.  We benefit would be disease control and improvement in overall survival elected the approval of this medication.  He develops bone pain that is diffuse, Trudi Ida will be a better option.  Taxotere chemotherapy will be deferred if he has visceral metastasis or more rapid growth in his disease.  After discussion today is agreeable to proceed with 1000 mg daily with prednisone 5 mg daily.  We will continue to monitor his PSA periodically on this therapy.  2.  Androgen deprivation: I recommended continuing Lupron which he currently receives under the care of Dr. Diona Fanti every 3 months.  This will continue indefinitely.  3.  Bone directed therapy: I recommended continuing Xgeva as he is doing as well.  4.  Hip pain: Appears to be arthritic in nature although this can be reevaluated  in the future with repeat imaging studies and potentially palliative radiation therapy.  His pain is manageable with minimal pain medication at this time.  5.  Prognosis and goals of care: Therapy remains palliative at this time given his incurable malignancy.  His performance status is adequate and aggressive therapy is warranted.  6.  Follow-up: We will be in 4 weeks to assess his tolerance to this therapy.  60  minutes was spent with the patient face-to-face today.  More than 50% of time was dedicated to reviewing imaging studies, laboratory data, discussing treatment options, complications related therapy and answering questions regarding future plan of care.      Thank you for the referral. A copy of this consult has been forwarded to the requesting physician.

## 2018-10-10 ENCOUNTER — Telehealth: Payer: Self-pay

## 2018-10-10 NOTE — Telephone Encounter (Signed)
Oral Oncology Singh Advocate Encounter  Received notification from [facility] that prior authorization for Fabio Asa is required.  PA submitted on CoverMyMeds Key [id number] Status is pending  Oral Oncology Clinic will continue to follow.  Adrian Singh [place] Phone [phone] Fax [phone]

## 2018-10-10 NOTE — Telephone Encounter (Signed)
Oral Oncology Patient Advocate Encounter  Zytiga copay is $784.04. There are no grant funds available for his disease state. I am able to assist in signing him up for manufacturer assistance with [name] and [name]. I called the patient and went over this with him, he agreed to proceed with the manufacturer assistance application. I will fill the application out for him and he will come in on Monday 10/13/18 to sign it and bring in his income documents. Denyse Amass, oral oncology pharmacist will have samples to give him.  This encounter will be updated until final determination.  [place] Patient [place] Phone [phone] Fax [phone]

## 2018-10-10 NOTE — Telephone Encounter (Signed)
Oral Oncology Patient Advocate Encounter  Prior Authorization for Fabio Asa has been approved.    PA# [id number] Effective dates: 10/10/18 through 10/11/19  Oral Oncology Clinic will continue to follow.   [place] Patient [place] Phone [phone] Fax [phone]

## 2018-10-14 NOTE — Telephone Encounter (Signed)
Oral Oncology Patient Advocate Encounter  I faxed the completed [name] and [place] application today 6/37/85.  This encounter will be updated until final determination.  [place] Patient [place] Phone [phone] Fax [phone]

## 2018-10-15 NOTE — Telephone Encounter (Signed)
Oral Oncology Patient Advocate Encounter  I followed up with Adrian Singh and Adrian Singh and they have received everything they need and they expect the application to be completed by the end of next week which is 10/24/18.  I called the patients daughter, Adrian Singh and left this information on her voicemail per the patients and her request.  This encounter will be updated until final determination.  [name] Patient [place] Phone [phone] Fax [phone]

## 2018-10-17 ENCOUNTER — Telehealth: Payer: Self-pay | Admitting: *Deleted

## 2018-10-17 DIAGNOSIS — C7951 Secondary malignant neoplasm of bone: Secondary | ICD-10-CM | POA: Diagnosis not present

## 2018-10-17 DIAGNOSIS — C61 Malignant neoplasm of prostate: Secondary | ICD-10-CM | POA: Diagnosis not present

## 2018-10-17 NOTE — Telephone Encounter (Addendum)
Oral Oncology Patient Advocate Encounter  Adrian Singh has been approved to be filled with [name] and [name] through 12/16/18. The patient will need to sign the medicare attestation form to be approved through 09/17/19.  I called the patient and gave him this information, he will come in on Monday to sign the form and I will send back to extend his approval.  The patient verbalized understanding and great appreciation.  While I was speaking with the patient he stated that he was having really bad hip pain that just started. I transferred him to [place], Therapist, sports.  [name] Patient [name] Phone [phone] Fax [phone]

## 2018-10-17 NOTE — Telephone Encounter (Signed)
Oral Oncology Patient Advocate Encounter  I called and followed up with Adrian Singh and Adrian Singh, the application is still in processing and should have a determination in 2-3 business days.  This encounter will be updated until final determination.  [place] Patient [place] Phone [phone] Fax [phone]

## 2018-10-17 NOTE — Telephone Encounter (Signed)
Patient calling to c/o pain in his right hip and lower back. States he has taken tramadol and aleve. He states since lying down he thinks it is better. Instructed him to call back if tramadol and aleve, no longer help.

## 2018-10-20 NOTE — Telephone Encounter (Signed)
Oral Oncology Patient Advocate Encounter  I met the patient in the lobby today and he signed the medicare attestation form. I faxed that today 10/20/18.  [place] Patient [place] Phone [phone] Fax [phone]

## 2018-10-30 ENCOUNTER — Telehealth: Payer: Self-pay | Admitting: *Deleted

## 2018-10-30 DIAGNOSIS — E1165 Type 2 diabetes mellitus with hyperglycemia: Secondary | ICD-10-CM | POA: Diagnosis not present

## 2018-10-30 NOTE — Telephone Encounter (Signed)
Medical records faxed to [facility]; [place] [phone]

## 2018-11-04 ENCOUNTER — Other Ambulatory Visit: Payer: Self-pay

## 2018-11-04 DIAGNOSIS — C61 Malignant neoplasm of prostate: Secondary | ICD-10-CM

## 2018-11-04 MED ORDER — ABIRATERONE ACETATE 250 MG PO TABS: 1000.0000 mg | ORAL_TABLET | Freq: Every day | ORAL | 0 refills | Status: DC

## 2018-11-07 ENCOUNTER — Telehealth: Payer: Self-pay | Admitting: Cardiology

## 2018-11-07 NOTE — Telephone Encounter (Signed)
° ° °  Patient calling the office for samples of medication:   1.  What medication and dosage are you requesting samples for? XARELTO  2.  Are you currently out of this medication? NO

## 2018-11-07 NOTE — Telephone Encounter (Signed)
Called, notified patient that we had samples. They were placed up front   Medication given: Xarelto 20 mg Lot #: [id number] Expiration: 07/2020 Qty: 4 bottles.

## 2018-11-10 NOTE — Progress Notes (Signed)
Adrian Singh Date of Birth: 08-02-1934   History of Present Illness: Adrian Singh is seen today for evaluation of chest pain.   He has a history of orthostatic dizziness. He was admitted in July 5183 with an embolic stroke presumably due to paroxysmal Afib. He was anticoagulated with Eliquis. The patient developed tachycardia and HA on Eliquis that resolved when he was switched to Xarelto. Previous orthostatic dizziness improved with stopping diuretic and alpha blockers he was taking for enlarged prostate.   He was seen in the office on 08/10/2015 with shortness of breath with exertion. He had outpatient stress test done on 08/25/2015 which showed EF mildly decreased 45-54%, no significant ST elevation or depression during stress portion, overall considered low risk study, there was prior myocardial infarction with peri-infarct ischemia. The peri-infarct ischemia was felt to be new compared to the previous stress test, therefore outpatient cardiac catheterization was arranged. He underwent planned study on 09/22/2015 which showed 30% mid LAD lesion, 99% mid to distal left circumflex lesion, 50% OM 3 lesion. Due to poor support from right radial approach and concern for renal function, it was planned for staged PCI at a later time. He was admitted to [facility] on 10/06/15 for staged PCI/DES to mid LCx lesion that wa 99% stenosed. There was residual 30% mid LAD stenosis to be treated medically.  He was admitted in June 2017 with chest pain. Cardiac cath showed no new disease and prior stent was patent. Later when seen as an outpatient he developed orthostatic dizziness. maxzide was stopped and metoprolol was reduced with improvement.  When seen in 2017 he reported marked worsening of orthostatic dizziness.  He was back on Maxide and also taking Flomax for obstructive urinary symptoms. We stopped both of these medications and referred him to urology. He underwent laser TURP on 11/19/80 without complication. 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   On follow up today he notes one episode of SOB while talking on the phone last week. Lasted about 10 minutes then resolved. No chest pain. He is having arthralgias with initially hip pain and now in his jaw. ? Related to CA versus therapy. BP has been running higher. On last visit we increased ramipril to 5 mg daily.      Current Outpatient Medications on File Prior to Visit  Medication Sig Dispense Refill  . abiraterone acetate (ZYTIGA) 250 MG tablet Take 4 tablets (1,000 mg total) by mouth daily. Take on an empty stomach 1 hour before or 2 hours after a meal 120 tablet 0  . atorvastatin (LIPITOR) 40 MG tablet Take 0.5 tablets (20 mg total) by mouth daily. 45 tablet 3  . BAYER MICROLET LANCETS lancets Use to test blood sugar once daily dx: E11.40 100 each 1  . Blood Glucose Monitoring Suppl (CONTOUR NEXT EZ MONITOR) w/Device KIT USE TO TEST BLOOD SUGAR ONCE DAILY. Dx Code E11.40 1 kit 0  . Cholecalciferol (VITAMIN D3) 2000 units TABS Take 2,000 Units by mouth daily.    Marland Kitchen docusate sodium (COLACE) 250 MG capsule Take 250 mg by mouth daily.    Marland Kitchen glimepiride (AMARYL) 4 MG tablet TAKE 1 TABLET BY MOUTH TWICE DAILY 180 tablet 3  . glucose blood (CONTOUR NEXT TEST) test strip Use to check blood sugar once a day. Dx Codes: E11.40, E11.65 100 each 3  . metFORMIN (GLUCOPHAGE) 500 MG tablet Take 1 tablet (500 mg total) by mouth  3 (three) times daily. 270 tablet 3  . metoprolol succinate (TOPROL-XL) 100 MG 24 hr tablet TAKE 1/2 TABLET BY MOUTH ONCE DAILY (TAKE  WITH  OR  IMMEDIATELY  FOLLOWING  A  MEAL) 45 tablet 3  . Multiple Vitamin (MULTIVITAMIN WITH MINERALS) TABS tablet Take 1 tablet by mouth 2 (two) times a week. ONE-A-DAY FOR MEN    . Naproxen Sodium (ALEVE) 220 MG CAPS Take 220 mg by mouth daily.     . nitroGLYCERIN (NITROSTAT) 0.4 MG SL tablet Place 1 tablet (0.4 mg total) under the tongue every  5 (five) minutes as needed for chest pain. 25 tablet 0  . Pantothenic Acid 500 MG TABS Take by mouth.    . predniSONE (DELTASONE) 5 MG tablet Take 1 tablet (5 mg total) by mouth daily with breakfast. 90 tablet 3  . rivaroxaban (XARELTO) 20 MG TABS tablet Take 1 tablet (20 mg total) by mouth daily with supper. 28 tablet 0  . tamsulosin (FLOMAX) 0.4 MG CAPS capsule TAKE 1 CAPSULE BY MOUTH ONCE DAILY 90 capsule 3  . traMADol (ULTRAM) 50 MG tablet TAKE 1 TABLET BY MOUTH THREE TIMES DAILY AS NEEDED 30 tablet 0   No current facility-administered medications on file prior to visit.     Allergies  Allergen Reactions  . Soma [Carisoprodol] Other (See Comments)    "did a number on me"  . Doxazosin Other (See Comments)    dizziness    Past Medical History:  Diagnosis Date  . Arthritis    "legs, back" (10/06/2015)  . Atrial fibrillation ([place])    x1  . CAD (coronary artery disease)    nonobstructive  . Cancer (Adrian Singh)    skin cancer on ear (froze it off) and back (cut it off)  . Dysrhythmia   . Esophageal reflux    hx of  . History of kidney stones   . Hx of heart artery stent   . Hypertrophy of prostate without urinary obstruction and other lower urinary tract symptoms (LUTS)   . Osteoarthrosis, unspecified whether generalized or localized, unspecified site   . Prostate cancer ([place])   . Skin cancer   . Stroke [facility]) 03/2014   "had a series of mini strokes; maybe 4"; denies residual on 10/06/2015  . Thrombocytopenia ([place])   . Type II diabetes mellitus ([place])    type 2  . Unspecified essential hypertension     Past Surgical History:  Procedure Laterality Date  . BACK SURGERY     2001  . CARDIAC CATHETERIZATION  12/2007  . CARDIAC CATHETERIZATION N/A 09/22/2015   Procedure: Left Heart Cath and Coronary Angiography;  Surgeon: Osha Errico M Martinique, MD;  Location: [place] [facility];  Service: Cardiovascular;  Laterality: N/A;  . CARDIAC CATHETERIZATION N/A 10/06/2015   Procedure: Coronary Stent  Intervention;  Surgeon: Skyleigh Windle M [place], MD;  Location: [place] [facility];  Service: Cardiovascular;  Laterality: N/A;  . CARDIAC CATHETERIZATION N/A 02/29/2016   Procedure: Left Heart Cath and Coronary Angiography;  Surgeon: Jettie Booze, MD;  Location: [place] [facility];  Service: Cardiovascular;  Laterality: N/A;  . COLONOSCOPY W/ BIOPSIES AND POLYPECTOMY    . CORONARY STENT PLACEMENT  10/06/2015   [name]  with DES  . EAR CYST EXCISION N/A 04/06/2015   Procedure: EXCISION OF SCALP CYST;  Surgeon: Donnie Mesa, MD;  Location: [place];  Service: General;  Laterality: N/A;  . ESOPHAGOGASTRODUODENOSCOPY (EGD) WITH ESOPHAGEAL DILATION  2001   with dilation  .  LUMBAR DISC SURGERY  09/26/1999   "cleaned out arthritis and bone spurs"  . SHOULDER ARTHROSCOPY W/ ROTATOR CUFF REPAIR Left 2005  . THULIUM LASER TURP (TRANSURETHRAL RESECTION OF PROSTATE) N/A 01/21/2017   Procedure: Marcelino Duster LASER TURP (TRANSURETHRAL RESECTION OF PROSTATE) CAUTERIZATION OF BLADDER LESION;  Surgeon: Franchot Gallo, MD;  Location: [facility];  Service: Urology;  Laterality: N/A;    Social History   Tobacco Use  Smoking Status Former Smoker  . Years: 3.00  . Types: Cigarettes  Smokeless Tobacco Never Used  Tobacco Comment   " Quit smoking by age 42; was a someday smoker "    Social History   Substance and Sexual Activity  Alcohol Use No    Family History  Problem Relation Age of Onset  . Stroke Father   . Peripheral vascular disease Father        amputation  . Heart failure Mother        CHF  . Coronary artery disease Mother   . Heart attack Mother 49       Multiple  . COPD Brother   . Heart disease Brother   . Cancer Brother        Bone  . Lung cancer Sister     Review of Systems: The review of systems is as noted in HPI.  All other systems were reviewed and are negative.  Physical Exam: BP (!) 150/84   Pulse 75   Ht '5\' 10"'  (1.778 m)   Wt 200 lb (90.7 kg)   BMI 28.70 kg/m   GENERAL:  Well  appearing overweight WM in NAD HEENT:  PERRL, EOMI, sclera are clear. Oropharynx is clear. NECK:  No jugular venous distention, carotid upstroke brisk and symmetric, no bruits, no thyromegaly or adenopathy LUNGS:  Clear to auscultation bilaterally CHEST:  Tender to palpatation in the left chest and lateral ribs. HEART:  RRR,  PMI not displaced or sustained,S1 and S2 within normal limits, no S3, no S4: no clicks, no rubs, no murmurs ABD:  Soft, nontender. BS +, no masses or bruits. No hepatomegaly, no splenomegaly EXT:  2 + pulses throughout, no edema, no cyanosis no clubbing SKIN:  Warm and dry.  No rashes NEURO:  Alert and oriented x 3. Cranial nerves II through XII intact. PSYCH:  Cognitively intact    LABORATORY DATA: Lab Results  Component Value Date   WBC 6.8 11/13/2018   HGB 11.6 (L) 11/13/2018   HCT 36.4 (L) 11/13/2018   PLT 145 (L) 11/13/2018   GLUCOSE 198 (H) 11/13/2018   CHOL 83 11/14/2017   TRIG 199.0 (H) 11/14/2017   HDL 32.50 (L) 11/14/2017   LDLDIRECT 77.0 03/28/2015   LDLCALC 11 11/14/2017   ALT 18 11/13/2018   AST 21 11/13/2018   NA 144 11/13/2018   K 3.0 (LL) 11/13/2018   CL 103 11/13/2018   CREATININE 1.32 (H) 11/13/2018   BUN 13 11/13/2018   CO2 28 11/13/2018   TSH 3.33 08/26/2013   PSA 158.00 (H) 01/14/2017   INR 1.45 02/29/2016   HGBA1C 8.5 (A) 05/15/2018   MICROALBUR 17.2 (H) 05/23/2012    Assessment / Plan: 1. Orthostatic hypotension. . It is exacerbated by medication- especially diuretics and Flomax. Resolved after stopping these meds.   2. CAD s/p DES of mid LCx in January 2017. He is asymptomatic. Repeat cardiac cath in June 2017 showed continued patency. Off antiplatelet therapy. Continue Xarelto.  3. Stage IV prostate CA with bony metastases. Followed by oncology and  urology.   4. CKD stage 3.   5. Paroxysmal Afib. Continue metoprolol and Xarelto.   6. History of CVA.  7. DM per  Dr Silvio Pate.  8. HTN still not well controlled.  Exacerbated by pain. Will increase ramipril to 10 mg daily.   I will follow up in 6 months.

## 2018-11-13 ENCOUNTER — Telehealth: Payer: Self-pay | Admitting: Oncology

## 2018-11-13 ENCOUNTER — Ambulatory Visit: Payer: Medicare Other | Admitting: Cardiology

## 2018-11-13 ENCOUNTER — Inpatient Hospital Stay: Payer: Medicare Other | Attending: Oncology

## 2018-11-13 ENCOUNTER — Encounter: Payer: Self-pay | Admitting: Cardiology

## 2018-11-13 ENCOUNTER — Inpatient Hospital Stay: Payer: Medicare Other | Admitting: Oncology

## 2018-11-13 VITALS — BP 150/84 | HR 75 | Ht 70.0 in | Wt 200.0 lb

## 2018-11-13 VITALS — BP 148/78 | HR 78 | Temp 97.7°F | Resp 20 | Ht 70.0 in | Wt 196.1 lb

## 2018-11-13 DIAGNOSIS — C61 Malignant neoplasm of prostate: Secondary | ICD-10-CM | POA: Insufficient documentation

## 2018-11-13 DIAGNOSIS — C7951 Secondary malignant neoplasm of bone: Secondary | ICD-10-CM | POA: Insufficient documentation

## 2018-11-13 DIAGNOSIS — M25559 Pain in unspecified hip: Secondary | ICD-10-CM | POA: Insufficient documentation

## 2018-11-13 DIAGNOSIS — Z9861 Coronary angioplasty status: Secondary | ICD-10-CM

## 2018-11-13 DIAGNOSIS — I2510 Atherosclerotic heart disease of native coronary artery without angina pectoris: Secondary | ICD-10-CM

## 2018-11-13 DIAGNOSIS — Z79818 Long term (current) use of other agents affecting estrogen receptors and estrogen levels: Secondary | ICD-10-CM | POA: Diagnosis not present

## 2018-11-13 DIAGNOSIS — I10 Essential (primary) hypertension: Secondary | ICD-10-CM

## 2018-11-13 DIAGNOSIS — I480 Paroxysmal atrial fibrillation: Secondary | ICD-10-CM

## 2018-11-13 DIAGNOSIS — Z7901 Long term (current) use of anticoagulants: Secondary | ICD-10-CM

## 2018-11-13 DIAGNOSIS — E876 Hypokalemia: Secondary | ICD-10-CM

## 2018-11-13 DIAGNOSIS — Z79899 Other long term (current) drug therapy: Secondary | ICD-10-CM | POA: Diagnosis not present

## 2018-11-13 DIAGNOSIS — E119 Type 2 diabetes mellitus without complications: Secondary | ICD-10-CM | POA: Diagnosis not present

## 2018-11-13 DIAGNOSIS — R630 Anorexia: Secondary | ICD-10-CM | POA: Insufficient documentation

## 2018-11-13 DIAGNOSIS — Z7984 Long term (current) use of oral hypoglycemic drugs: Secondary | ICD-10-CM | POA: Diagnosis not present

## 2018-11-13 DIAGNOSIS — R439 Unspecified disturbances of smell and taste: Secondary | ICD-10-CM | POA: Insufficient documentation

## 2018-11-13 LAB — CBC WITH DIFFERENTIAL (CANCER CENTER ONLY)
Abs Immature Granulocytes: 0.01 10*3/uL (ref 0.00–0.07)
Basophils Absolute: 0.1 10*3/uL (ref 0.0–0.1)
Basophils Relative: 1 %
Eosinophils Absolute: 0.1 10*3/uL (ref 0.0–0.5)
Eosinophils Relative: 1 %
HCT: 36.4 % — ABNORMAL LOW (ref 39.0–52.0)
Hemoglobin: 11.6 g/dL — ABNORMAL LOW (ref 13.0–17.0)
Immature Granulocytes: 0 %
Lymphocytes Relative: 25 %
Lymphs Abs: 1.7 10*3/uL (ref 0.7–4.0)
MCH: 28.2 pg (ref 26.0–34.0)
MCHC: 31.9 g/dL (ref 30.0–36.0)
MCV: 88.6 fL (ref 80.0–100.0)
Monocytes Absolute: 0.9 10*3/uL (ref 0.1–1.0)
Monocytes Relative: 13 %
Neutro Abs: 4.1 10*3/uL (ref 1.7–7.7)
Neutrophils Relative %: 60 %
Platelet Count: 145 10*3/uL — ABNORMAL LOW (ref 150–400)
RBC: 4.11 MIL/uL — ABNORMAL LOW (ref 4.22–5.81)
RDW: 14 % (ref 11.5–15.5)
WBC Count: 6.8 10*3/uL (ref 4.0–10.5)
nRBC: 0 % (ref 0.0–0.2)

## 2018-11-13 LAB — CMP (CANCER CENTER ONLY)
ALT: 18 U/L (ref 0–44)
AST: 21 U/L (ref 15–41)
Albumin: 3.8 g/dL (ref 3.5–5.0)
Alkaline Phosphatase: 67 U/L (ref 38–126)
Anion gap: 13 (ref 5–15)
BUN: 13 mg/dL (ref 8–23)
CO2: 28 mmol/L (ref 22–32)
Calcium: 9 mg/dL (ref 8.9–10.3)
Chloride: 103 mmol/L (ref 98–111)
Creatinine: 1.32 mg/dL — ABNORMAL HIGH (ref 0.61–1.24)
GFR, Est AFR Am: 58 mL/min — ABNORMAL LOW (ref >60)
GFR, Estimated: 50 mL/min — ABNORMAL LOW (ref >60)
Glucose, Bld: 198 mg/dL — ABNORMAL HIGH (ref 70–99)
Potassium: 3 mmol/L — CL (ref 3.5–5.1)
Sodium: 144 mmol/L (ref 135–145)
Total Bilirubin: 1 mg/dL (ref 0.3–1.2)
Total Protein: 7.3 g/dL (ref 6.5–8.1)

## 2018-11-13 MED ORDER — POTASSIUM CHLORIDE ER 10 MEQ PO TBCR: 10.0000 meq | EXTENDED_RELEASE_TABLET | Freq: Every day | ORAL | 3 refills | Status: DC

## 2018-11-13 MED ORDER — RAMIPRIL 10 MG PO CAPS: 10.0000 mg | ORAL_CAPSULE | Freq: Every day | ORAL | 3 refills | Status: DC

## 2018-11-13 NOTE — Progress Notes (Signed)
Hematology and Oncology Follow Up Visit  Adrian Singh [id number] 1934-07-08 85 y.o. 11/13/2018 9:54 AM Adrian North, MD   Principle Diagnosis: 85 year old man with castration-resistant prostate cancer with disease to the bone diagnosed in April 2018.  He presented with a Gleason score 4+5 = 9 and a PSA of 158 with advanced disease.   Prior Therapy:   Androgen deprivation therapy in the form of Lupron under the care of Dr. Diona Singh at the time of presentation.  Current therapy:  Zytiga 1000 mg daily started in January 2020 after developing castration resistant disease.   Interim History: Adrian Singh presents today for a follow-up visit.  Since last visit, he started Zytiga and prednisone without any major complaints.  He does report some mild anorexia and alteration in taste but no other complaints.  He denies any worsening edema or excessive fatigue.  He denies any recent hospitalizations or illnesses.  He denies any major changes in his performance status.  His performance status and quality of life remains unchanged.   Patient denied any alteration mental status, neuropathy, confusion or dizziness.  Denies any headaches or lethargy.  Denies any night sweats, weight loss or changes in appetite.  Denied orthopnea, dyspnea on exertion or chest discomfort.  Denies shortness of breath, difficulty breathing hemoptysis or cough.  Denies any abdominal distention, nausea, early satiety or dyspepsia.  Denies any hematuria, frequency, dysuria or nocturia.  Denies any skin irritation, dryness or rash.  Denies any ecchymosis or petechiae.  Denies any lymphadenopathy or clotting.  Denies any heat or cold intolerance.  Denies any anxiety or depression.  Remaining review of system is negative.        Medications: I have reviewed the patient's current medications.  Current Outpatient Medications  Medication Sig Dispense Refill  . abiraterone acetate (ZYTIGA) 250 MG tablet  Take 4 tablets (1,000 mg total) by mouth daily. Take on an empty stomach 1 hour before or 2 hours after a meal 120 tablet 0  . atorvastatin (LIPITOR) 40 MG tablet Take 0.5 tablets (20 mg total) by mouth daily. 45 tablet 3  . BAYER MICROLET LANCETS lancets Use to test blood sugar once daily dx: E11.40 100 each 1  . Blood Glucose Monitoring Suppl (CONTOUR NEXT EZ MONITOR) w/Device KIT USE TO TEST BLOOD SUGAR ONCE DAILY. Dx Code E11.40 1 kit 0  . Cholecalciferol (VITAMIN D3) 2000 units TABS Take 2,000 Units by mouth daily.    Marland Kitchen docusate sodium (COLACE) 250 MG capsule Take 250 mg by mouth daily.    Marland Kitchen glimepiride (AMARYL) 4 MG tablet TAKE 1 TABLET BY MOUTH TWICE DAILY 180 tablet 3  . glucose blood (CONTOUR NEXT TEST) test strip Use to check blood sugar once a day. Dx Codes: E11.40, E11.65 100 each 3  . metFORMIN (GLUCOPHAGE) 500 MG tablet Take 1 tablet (500 mg total) by mouth 3 (three) times daily. 270 tablet 3  . metoprolol succinate (TOPROL-XL) 100 MG 24 hr tablet TAKE 1/2 TABLET BY MOUTH ONCE DAILY (TAKE  WITH  OR  IMMEDIATELY  FOLLOWING  A  MEAL) 45 tablet 3  . Multiple Vitamin (MULTIVITAMIN WITH MINERALS) TABS tablet Take 1 tablet by mouth 2 (two) times a week. ONE-A-DAY FOR MEN    . Naproxen Sodium (ALEVE) 220 MG CAPS Take 220 mg by mouth daily.     . nitroGLYCERIN (NITROSTAT) 0.4 MG SL tablet Place 1 tablet (0.4 mg total) under the tongue every 5 (five) minutes as needed for  chest pain. 25 tablet 0  . Pantothenic Acid 500 MG TABS Take by mouth.    . predniSONE (DELTASONE) 5 MG tablet Take 1 tablet (5 mg total) by mouth daily with breakfast. 90 tablet 3  . ramipril (ALTACE) 5 MG capsule Take 1 capsule (5 mg total) by mouth daily. (Patient taking differently: Take 5 mg by mouth 2 (two) times daily. ) 90 capsule 3  . rivaroxaban (XARELTO) 20 MG TABS tablet Take 1 tablet (20 mg total) by mouth daily with supper. 28 tablet 0  . tamsulosin (FLOMAX) 0.4 MG CAPS capsule TAKE 1 CAPSULE BY MOUTH ONCE DAILY  90 capsule 3  . traMADol (ULTRAM) 50 MG tablet TAKE 1 TABLET BY MOUTH THREE TIMES DAILY AS NEEDED 30 tablet 0   No current facility-administered medications for this visit.      Allergies:  Allergies  Allergen Reactions  . Soma [Carisoprodol] Other (See Comments)    "did a number on me"  . Doxazosin Other (See Comments)    dizziness    Past Medical History, Surgical history, Social history, and Family History were reviewed and updated.    Physical Exam:  ECOG:  General appearance: alert and cooperative appeared without distress. Head: Normocephalic, without obvious abnormality Oropharynx: No oral thrush or ulcers. Eyes: No scleral icterus.  Pupils are equal and round reactive to light. Lymph nodes: Cervical, supraclavicular, and axillary nodes normal. Heart:regular rate and rhythm, S1, S2 normal, no murmur, click, rub or gallop Lung:chest clear, no wheezing, rales, normal symmetric air entry Abdomin: soft, non-tender, without masses or organomegaly. Neurological: No motor, sensory deficits.  Intact deep tendon reflexes. Skin: No rashes or lesions.  No ecchymosis or petechiae. Musculoskeletal: No joint deformity or effusion. Psychiatric: Mood and affect are appropriate.    Lab Results: Lab Results  Component Value Date   WBC 6.8 11/13/2018   HGB 11.6 (L) 11/13/2018   HCT 36.4 (L) 11/13/2018   MCV 88.6 11/13/2018   PLT 145 (L) 11/13/2018     Chemistry      Component Value Date/Time   NA 139 11/14/2017 1613   K 4.7 11/14/2017 1613   CL 103 11/14/2017 1613   CO2 27 11/14/2017 1613   BUN 22 11/14/2017 1613   CREATININE 1.48 11/14/2017 1613   CREATININE 1.57 (H) 10/05/2015 1047      Component Value Date/Time   CALCIUM 9.8 11/14/2017 1613   ALKPHOS 49 11/14/2017 1613   AST 25 11/14/2017 1613   ALT 22 11/14/2017 1613   BILITOT 0.5 11/14/2017 1613        Impression and Plan:  84 year old man with:  1.  Advanced prostate cancer with disease to the bone  that is currently Castration-resistant after presenting with advanced disease in April 2018.    He started on Zytiga and prednisone in January 2020.  Risks and benefits of continuing this therapy long-term was reviewed.  Complications including edema, renal sufficiency and hypertension were also discussed.  Alternative therapies were also reviewed including systemic chemotherapy and Xofigo.  He is agreeable to proceed at this time.  His PSA is currently pending and will be updated in the near future.  2.  Androgen deprivation: He continues to receive that under the care of Dr. Diona Singh.  I recommended continuing this indefinitely.  3.  Bone directed therapy: He is currently receiving Xofigo under the care of Dr. Diona Singh.  No changes at this time.  4.  Hip pain: Manageable at this time with conservative measures.  5.  Prognosis and goals of care: Therapy remains palliative at this time given his incurable malignancy.  His performance status is adequate and aggressive therapy is warranted.  6.  Hypokalemia: He will start on potassium supplements daily associated with Zytiga.  We will continue to monitor his potassium with this medication.  7.  Follow-up: We will be in 4 weeks for repeat evaluation.  25  minutes was spent with the patient face-to-face today.  More than 50% of time was dedicated to discussing his disease status, complications related to therapy and managing toxicities.      Zola Button, MD 2/27/20209:54 AM

## 2018-11-13 NOTE — Addendum Note (Signed)
Addended by: Wyatt Portela on: 11/13/2018 10:29 AM   Modules accepted: Orders

## 2018-11-13 NOTE — Patient Instructions (Signed)
Increase ramipril to 10 mg daily

## 2018-11-13 NOTE — Telephone Encounter (Signed)
Scheduled appt per 02/27 los. ° °Printed calendar and avs. °

## 2018-11-14 ENCOUNTER — Telehealth: Payer: Self-pay | Admitting: *Deleted

## 2018-11-14 DIAGNOSIS — C7951 Secondary malignant neoplasm of bone: Secondary | ICD-10-CM | POA: Diagnosis not present

## 2018-11-14 DIAGNOSIS — C61 Malignant neoplasm of prostate: Secondary | ICD-10-CM | POA: Diagnosis not present

## 2018-11-14 LAB — PROSTATE-SPECIFIC AG, SERUM (LABCORP): Prostate Specific Ag, Serum: 1 ng/mL (ref 0.0–4.0)

## 2018-11-14 NOTE — Telephone Encounter (Signed)
LM with results below

## 2018-11-14 NOTE — Telephone Encounter (Signed)
-----   Message from Wyatt Portela, MD sent at 11/14/2018  8:03 AM EST ----- Please let him know his PSA is down.

## 2018-11-26 ENCOUNTER — Telehealth: Payer: Self-pay | Admitting: Internal Medicine

## 2018-11-26 NOTE — Telephone Encounter (Signed)
Okay, thanks

## 2018-11-26 NOTE — Telephone Encounter (Signed)
Patient's wife,Alma,called.  Patient had an appointment for a AWV tomorrow.  She rescheduled patient's appointment because they're concerned about the Coronavirus and patient's having cancer treatment done and he's very weak.  She rescheduled patient's appointment to July.

## 2018-11-27 ENCOUNTER — Encounter: Payer: Medicare Other | Admitting: Internal Medicine

## 2018-12-03 ENCOUNTER — Other Ambulatory Visit: Payer: Self-pay | Admitting: Oncology

## 2018-12-03 DIAGNOSIS — C61 Malignant neoplasm of prostate: Secondary | ICD-10-CM

## 2018-12-04 ENCOUNTER — Telehealth: Payer: Self-pay

## 2018-12-04 MED ORDER — FUROSEMIDE 40 MG PO TABS: 40.0000 mg | ORAL_TABLET | Freq: Every day | ORAL | 0 refills | Status: DC | PRN

## 2018-12-04 NOTE — Telephone Encounter (Signed)
Spoke to pt. Rx sent electronically.

## 2018-12-04 NOTE — Telephone Encounter (Signed)
Pt said he saw Dr Silvio Pate at [place] office on 12/03/18 and was advised to call [facility] about both lower legs swelling. Pt said today swelling has gone down slightly but top of sock makes indention in leg and when pt pushes with index finger on ankles the finger makes an indention that does not pop back up. Ankles and feet also very puffy; slight redness on top of feet; no red streaks but feels warm naturally.pt does elevate feet when sitting and overnight lower legs, ankles and feet remain swollen. No SOB. Pt requesting fluid pill to [facility] [address].Please advise.

## 2018-12-04 NOTE — Telephone Encounter (Signed)
He was not having any SOB or chest symptoms Make sure he is not putting salt on his food Okay to send Rx for furosemide 40mg  1 daily prn (limit to 2/week) #15 x 0 If ongoing problems, will need reevaluation

## 2018-12-08 ENCOUNTER — Other Ambulatory Visit: Payer: Self-pay | Admitting: Internal Medicine

## 2018-12-09 NOTE — Telephone Encounter (Signed)
Received [facility] CALL notes that patient called on 12/09/2018 at 7:26 am in regards to his refill on Glimepiride patient had 2 tablets left to take. Refill request was sent in on 12/08/2018 at 4:58 pm and this was refilled today 12/09/2018 at 9:16 am. Patient left the pharmacy by that time. Pharmacy did supply patient with enough medication supply for 3 days just in case. I apologized to patient that he will have to make another trip out. Patient is ok and was understanding. Apologized several times. He will go by the pharmacy on Thursday when he has to go to an appointment for his cancer. Advised patient if there is any issues with this medication to let us know.

## 2018-12-11 ENCOUNTER — Telehealth: Payer: Self-pay | Admitting: Oncology

## 2018-12-11 ENCOUNTER — Inpatient Hospital Stay: Payer: Medicare Other | Attending: Oncology

## 2018-12-11 ENCOUNTER — Inpatient Hospital Stay: Payer: Medicare Other | Admitting: Oncology

## 2018-12-11 ENCOUNTER — Encounter: Payer: Self-pay | Admitting: Oncology

## 2018-12-11 ENCOUNTER — Other Ambulatory Visit: Payer: Self-pay

## 2018-12-11 VITALS — BP 163/83 | HR 112 | Temp 98.2°F | Resp 18 | Ht 70.0 in | Wt 191.6 lb

## 2018-12-11 DIAGNOSIS — Z7901 Long term (current) use of anticoagulants: Secondary | ICD-10-CM

## 2018-12-11 DIAGNOSIS — C61 Malignant neoplasm of prostate: Secondary | ICD-10-CM

## 2018-12-11 DIAGNOSIS — Z79899 Other long term (current) drug therapy: Secondary | ICD-10-CM | POA: Diagnosis not present

## 2018-12-11 DIAGNOSIS — M25559 Pain in unspecified hip: Secondary | ICD-10-CM | POA: Insufficient documentation

## 2018-12-11 DIAGNOSIS — C7951 Secondary malignant neoplasm of bone: Secondary | ICD-10-CM | POA: Insufficient documentation

## 2018-12-11 DIAGNOSIS — Z7984 Long term (current) use of oral hypoglycemic drugs: Secondary | ICD-10-CM | POA: Insufficient documentation

## 2018-12-11 DIAGNOSIS — E876 Hypokalemia: Secondary | ICD-10-CM

## 2018-12-11 LAB — CBC WITH DIFFERENTIAL (CANCER CENTER ONLY)
Abs Immature Granulocytes: 0.01 10*3/uL (ref 0.00–0.07)
Basophils Absolute: 0.1 10*3/uL (ref 0.0–0.1)
Basophils Relative: 1 %
Eosinophils Absolute: 0 10*3/uL (ref 0.0–0.5)
Eosinophils Relative: 0 %
HCT: 37.7 % — ABNORMAL LOW (ref 39.0–52.0)
Hemoglobin: 12 g/dL — ABNORMAL LOW (ref 13.0–17.0)
Immature Granulocytes: 0 %
Lymphocytes Relative: 17 %
Lymphs Abs: 1.2 10*3/uL (ref 0.7–4.0)
MCH: 28.4 pg (ref 26.0–34.0)
MCHC: 31.8 g/dL (ref 30.0–36.0)
MCV: 89.1 fL (ref 80.0–100.0)
Monocytes Absolute: 0.7 10*3/uL (ref 0.1–1.0)
Monocytes Relative: 10 %
Neutro Abs: 5 10*3/uL (ref 1.7–7.7)
Neutrophils Relative %: 72 %
Platelet Count: 154 10*3/uL (ref 150–400)
RBC: 4.23 MIL/uL (ref 4.22–5.81)
RDW: 13.6 % (ref 11.5–15.5)
WBC Count: 6.9 10*3/uL (ref 4.0–10.5)
nRBC: 0 % (ref 0.0–0.2)

## 2018-12-11 LAB — CMP (CANCER CENTER ONLY)
ALT: 22 U/L (ref 0–44)
AST: 29 U/L (ref 15–41)
Albumin: 3.6 g/dL (ref 3.5–5.0)
Alkaline Phosphatase: 63 U/L (ref 38–126)
Anion gap: 13 (ref 5–15)
BUN: 24 mg/dL — ABNORMAL HIGH (ref 8–23)
CO2: 26 mmol/L (ref 22–32)
Calcium: 9.6 mg/dL (ref 8.9–10.3)
Chloride: 104 mmol/L (ref 98–111)
Creatinine: 1.33 mg/dL — ABNORMAL HIGH (ref 0.61–1.24)
GFR, Est AFR Am: 57 mL/min — ABNORMAL LOW (ref >60)
GFR, Estimated: 49 mL/min — ABNORMAL LOW (ref >60)
Glucose, Bld: 243 mg/dL — ABNORMAL HIGH (ref 70–99)
Potassium: 3.2 mmol/L — ABNORMAL LOW (ref 3.5–5.1)
Sodium: 143 mmol/L (ref 135–145)
Total Bilirubin: 0.7 mg/dL (ref 0.3–1.2)
Total Protein: 7.5 g/dL (ref 6.5–8.1)

## 2018-12-11 NOTE — Progress Notes (Signed)
Hematology and Oncology Follow Up Visit  Adrian Singh [id number] November 10, 1933 85 y.o. 12/11/2018 11:53 AM Eddie North, MD   Principle Diagnosis: 85 year old man with advanced prostate cancer with disease to the bone diagnosed in April 2018.  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   Prior Therapy:   Androgen deprivation therapy in the form of Lupron under the care of Dr. Diona Fanti at the time of presentation.  Current therapy:  Zytiga 1000 mg daily started in January 2020 for castration-resistant disease and a PSA of 3.29 in December 2019.   Interim History: Adrian Singh returns today for a repeat evaluation.  Since the last visit, he continues to tolerate Zytiga without any major complaints.  He does report some mild fatigue and tiredness which has resolved at this time.  His appetite is at baseline without any recent weight loss or major changes.  His performance status is not changed at this time and ambulating without any major difficulties.  His performance status and quality of life remains about the same.     Patient denied headaches, blurry vision, syncope or seizures.  Denies any fevers, chills or sweats.  Denied chest pain, palpitation, orthopnea or leg edema.  Denied cough, wheezing or hemoptysis.  Denied nausea, vomiting or abdominal pain.  Denies any constipation or diarrhea.  Denies any frequency urgency or hesitancy.  Denies any arthralgias or myalgias.  Denies any skin rashes or lesions.  Denies any bleeding or clotting tendency.  Denies any easy bruising.  Denies any hair or nail changes.  Denies any anxiety or depression.  Remaining review of system is negative.          Medications: I have reviewed the patient's current medications.  Current Outpatient Medications  Medication Sig Dispense Refill  . atorvastatin (LIPITOR) 40 MG tablet Take 0.5  tablets (20 mg total) by mouth daily. 45 tablet 3  . BAYER MICROLET LANCETS lancets Use to test blood sugar once daily dx: E11.40 100 each 1  . Blood Glucose Monitoring Suppl (CONTOUR NEXT EZ MONITOR) w/Device KIT USE TO TEST BLOOD SUGAR ONCE DAILY. Dx Code E11.40 1 kit 0  . Cholecalciferol (VITAMIN D3) 2000 units TABS Take 2,000 Units by mouth daily.    Marland Kitchen docusate sodium (COLACE) 250 MG capsule Take 250 mg by mouth daily.    . furosemide (LASIX) 40 MG tablet Take 1 tablet (40 mg total) by mouth daily as needed. 15 tablet 0  . glimepiride (AMARYL) 4 MG tablet Take 1 tablet by mouth twice daily 180 tablet 1  . glucose blood (CONTOUR NEXT TEST) test strip Use to check blood sugar once a day. Dx Codes: E11.40, E11.65 100 each 3  . metFORMIN (GLUCOPHAGE) 500 MG tablet Take 1 tablet (500 mg total) by mouth 3 (three) times daily. 270 tablet 3  . metoprolol succinate (TOPROL-XL) 100 MG 24 hr tablet TAKE 1/2 TABLET BY MOUTH ONCE DAILY (TAKE  WITH  OR  IMMEDIATELY  FOLLOWING  A  MEAL) 45 tablet 3  . Multiple Vitamin (MULTIVITAMIN WITH MINERALS) TABS tablet Take 1 tablet by mouth 2 (two) times a week. ONE-A-DAY FOR MEN    . Naproxen Sodium (ALEVE) 220 MG CAPS Take 220 mg by mouth daily.     . nitroGLYCERIN (NITROSTAT) 0.4 MG SL tablet Place 1 tablet (0.4 mg total) under the tongue every 5 (five) minutes as needed  for chest pain. 25 tablet 0  . Pantothenic Acid 500 MG TABS Take by mouth.    . potassium chloride (K-DUR) 10 MEQ tablet Take 1 tablet (10 mEq total) by mouth daily. 30 tablet 3  . predniSONE (DELTASONE) 5 MG tablet Take 1 tablet (5 mg total) by mouth daily with breakfast. 90 tablet 3  . ramipril (ALTACE) 10 MG capsule Take 1 capsule (10 mg total) by mouth daily. 90 capsule 3  . rivaroxaban (XARELTO) 20 MG TABS tablet Take 1 tablet (20 mg total) by mouth daily with supper. 28 tablet 0  . tamsulosin (FLOMAX) 0.4 MG CAPS capsule TAKE 1 CAPSULE BY MOUTH ONCE DAILY 90 capsule 3  . traMADol (ULTRAM) 50  MG tablet TAKE 1 TABLET BY MOUTH THREE TIMES DAILY AS NEEDED 30 tablet 0  . ZYTIGA 250 MG tablet TAKE 4 TABLETS BY MOUTH ONCE DAILY  ON AN EMPTY STOMACH  AS DIRECTED.  TAKE 1 HOUR BEFORE OR 2 HOURS AFTER A MEAL 120 tablet 11   No current facility-administered medications for this visit.      Allergies:  Allergies  Allergen Reactions  . Soma [Carisoprodol] Other (See Comments)    "did a number on me"  . Doxazosin Other (See Comments)    dizziness    Past Medical History, Surgical history, Social history, and Family History were reviewed and updated.    Physical Exam: Blood pressure (!) 163/83, pulse (!) 112, temperature 98.2 F (36.8 C), temperature source Oral, resp. rate 18, height '5\' 10"'  (1.778 m), weight 191 lb 9.6 oz (86.9 kg), SpO2 98 %.   ECOG: 1    General appearance: Alert, awake without any distress. Head: Atraumatic without abnormalities Oropharynx: Without any thrush or ulcers. Eyes: No scleral icterus. Lymph nodes: No lymphadenopathy noted in the cervical, supraclavicular, or axillary nodes Heart:regular rate and rhythm, without any murmurs or gallops.   Lung: Clear to auscultation without any rhonchi, wheezes or dullness to percussion. Abdomin: Soft, nontender without any shifting dullness or ascites. Musculoskeletal: No clubbing or cyanosis. Neurological: No motor or sensory deficits. Skin: No rashes or lesions. Psychiatric: Mood and affect appeared normal.     Lab Results: Lab Results  Component Value Date   WBC 6.9 12/11/2018   HGB 12.0 (L) 12/11/2018   HCT 37.7 (L) 12/11/2018   MCV 89.1 12/11/2018   PLT 154 12/11/2018     Chemistry      Component Value Date/Time   NA 144 11/13/2018 0939   K 3.0 (LL) 11/13/2018 0939   CL 103 11/13/2018 0939   CO2 28 11/13/2018 0939   BUN 13 11/13/2018 0939   CREATININE 1.32 (H) 11/13/2018 0939   CREATININE 1.57 (H) 10/05/2015 1047      Component Value Date/Time   CALCIUM 9.0 11/13/2018 0939   ALKPHOS  67 11/13/2018 0939   AST 21 11/13/2018 0939   ALT 18 11/13/2018 0939   BILITOT 1.0 11/13/2018 0939      Results for SHONE, LEVENTHAL (MRN [id number]) as of 12/11/2018 11:47  Ref. Range 11/13/2018 09:39  Prostate Specific Ag, Serum Latest Ref Range: 0.0 - 4.0 ng/mL 1.0    Impression and Plan:  85 year old man with:  1.  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  He remains on Zytiga without any major complications and excellent tolerance.  His PSA showed a reasonable response declining from 3.29 down to 1.0 in January 2020.  Risks and benefits of continuing this treatment was discussed today and is agreeable to continue.  2.  Androgen deprivation: He remains on androgen deprivation of the care of Dr. Diona Fanti which I recommended continuing.  3.  Bone directed therapy: He is currently on Xgeva which I recommended continuing under the care of Dr. Diona Fanti.  4.  Hip pain: Manageable at this time without any further intervention.  5.  Prognosis and goals of care: His disease is incurable and therapy remains palliative at this time.  Aggressive therapy is warranted because of his reasonable performance status.  6.  Hypokalemia: I recommended continuing potassium supplements and we will check his potassium periodically.  7.  Follow-up: We will be in 3 months sooner if needed  15  minutes was spent with the patient face-to-face today.  More than 50% of time was spent on reviewing his disease status, treatment options and addressing questions regarding future plan of care.      Zola Button, MD 3/26/202011:53 AM

## 2018-12-11 NOTE — Progress Notes (Signed)
Met with patient at registration to introduce myself as Arboriculturist and to offer available resources.  Discussed one-time $700 [facility] and $200 Prostate grants to assist with personal expenses while going through treatment.  Gave my card if interested in applying and for any additional financial questions or concerns.

## 2018-12-11 NOTE — Telephone Encounter (Signed)
Scheduled appt per 3/26 los. ° °Printed calendar and avs. °

## 2018-12-12 ENCOUNTER — Telehealth: Payer: Self-pay | Admitting: *Deleted

## 2018-12-12 DIAGNOSIS — C61 Malignant neoplasm of prostate: Secondary | ICD-10-CM | POA: Diagnosis not present

## 2018-12-12 DIAGNOSIS — C7951 Secondary malignant neoplasm of bone: Secondary | ICD-10-CM | POA: Diagnosis not present

## 2018-12-12 LAB — PROSTATE-SPECIFIC AG, SERUM (LABCORP): Prostate Specific Ag, Serum: 1.1 ng/mL (ref 0.0–4.0)

## 2018-12-12 NOTE — Telephone Encounter (Signed)
-----   Message from Wyatt Portela, MD sent at 12/12/2018  9:08 AM EDT ----- Please let him know his PSA remains low. No changes for now.

## 2018-12-12 NOTE — Telephone Encounter (Signed)
Spoke with patient. Gave results of last PSA 

## 2018-12-26 ENCOUNTER — Telehealth: Payer: Self-pay | Admitting: *Deleted

## 2018-12-26 ENCOUNTER — Other Ambulatory Visit: Payer: Self-pay | Admitting: *Deleted

## 2018-12-26 MED ORDER — PROCHLORPERAZINE MALEATE 10 MG PO TABS: 10.0000 mg | ORAL_TABLET | Freq: Four times a day (QID) | ORAL | 0 refills | Status: DC | PRN

## 2018-12-26 NOTE — Telephone Encounter (Signed)
Rec'd [facility] after hours phone record.  Patient daughter and patient both called on two separate occassions on 12/25/2018 about worsening diarrhea and loss of appetite and weight loss.  "Called states he started having stomach rolling.  Right after he eats, stuff goes through him.  He isn't keeping much down, does not want to eat, and can't keep much down.  He is loosing weight and feels weak.  He takes Zytiga and Prednisone and wants to know if he can cut it back some."  His current dose is Zytiga 250mg  (4 tablets).  No Antiemetic was prescribed.  As of this morning 12/26/2018 he held his Zytiga until Dr. Alen Blew instructs.

## 2018-12-26 NOTE — Progress Notes (Signed)
Communicated medication hold for Zytiga and Prednisone and Rx sent to pharmacy for Compazine.  Patient will report back with update next week to see if symptoms resolved and instructions for when to restart.

## 2018-12-26 NOTE — Telephone Encounter (Signed)
He needs to hold zytiga and prednisone till these symptoms resolve. I don't feel they are related though. Please call him compazine 10 q6hr prn.

## 2018-12-29 ENCOUNTER — Telehealth: Payer: Self-pay | Admitting: *Deleted

## 2018-12-29 NOTE — Telephone Encounter (Signed)
Patient called to give report that his nausea, vomiting and diarrhea have just now today started to improve.  Advised to call with report tomorrow or Wednesday so that he can try to get over what was causing his symptoms and then we can reassess restart with Dr. Alen Blew since he didn't think they were related.  Patient was able to repeat understanding.

## 2018-12-31 ENCOUNTER — Telehealth: Payer: Self-pay

## 2018-12-31 NOTE — Telephone Encounter (Signed)
Received call from patient with an update of continued GI issues and remains off Zytiga and prednisone and is taking compazine as needed for nausea. Contacted patient and made him aware per Dr. Alen Blew to remain off the [facility] and to call when symptoms resolve hopefully in the next week or two. Patient aware to call back if symptoms worsen as well as any other questions or concerns. Patient verbalized understanding and had no other questions or concerns.

## 2018-12-31 NOTE — Telephone Encounter (Signed)
-----   Message from Wyatt Portela, MD sent at 12/31/2018 11:28 AM EDT ----- Regarding: RE: patient update He needs to keep off Zytiga till told otherwise. I anticipate improvement in the next week or so.  ----- Message ----- From: Scot Dock, RN Sent: 12/31/2018  11:22 AM EDT To: Wyatt Portela, MD Subject: patient update                                 Off Zytiga/pred since Friday 4/10. Diarrhea has resolved but patient still nauseas with any food/liquid intake (min intake), taking compazine 2-3x per day, still feels weak.

## 2019-01-05 ENCOUNTER — Telehealth: Payer: Self-pay | Admitting: Cardiology

## 2019-01-05 MED ORDER — RIVAROXABAN 20 MG PO TABS: 20.0000 mg | ORAL_TABLET | Freq: Every day | ORAL | 3 refills | Status: DC

## 2019-01-05 NOTE — Telephone Encounter (Signed)
Patient calling the office for samples of medication:   1.  What medication and dosage are you requesting samples for? rivaroxaban (XARELTO) 20 MG TABS tablet  2.  Are you currently out of this medication? No, pt will have daughter come by on Wed 01/07/19 to pick up samples if we have them.

## 2019-01-05 NOTE — Telephone Encounter (Signed)
Spoke to patient advised office out of xarelto  samples.Prescription sent to pharmacy.

## 2019-01-06 ENCOUNTER — Encounter: Payer: Self-pay | Admitting: Medical Oncology

## 2019-01-12 DIAGNOSIS — C7951 Secondary malignant neoplasm of bone: Secondary | ICD-10-CM | POA: Diagnosis not present

## 2019-01-12 DIAGNOSIS — Z192 Hormone resistant malignancy status: Secondary | ICD-10-CM | POA: Diagnosis not present

## 2019-01-12 DIAGNOSIS — R9721 Rising PSA following treatment for malignant neoplasm of prostate: Secondary | ICD-10-CM | POA: Diagnosis not present

## 2019-01-12 DIAGNOSIS — C61 Malignant neoplasm of prostate: Secondary | ICD-10-CM | POA: Diagnosis not present

## 2019-01-13 ENCOUNTER — Other Ambulatory Visit: Payer: Self-pay | Admitting: Urology

## 2019-01-13 DIAGNOSIS — C61 Malignant neoplasm of prostate: Secondary | ICD-10-CM

## 2019-01-19 DIAGNOSIS — C7951 Secondary malignant neoplasm of bone: Secondary | ICD-10-CM | POA: Diagnosis not present

## 2019-01-19 DIAGNOSIS — C61 Malignant neoplasm of prostate: Secondary | ICD-10-CM | POA: Diagnosis not present

## 2019-01-21 ENCOUNTER — Encounter (HOSPITAL_COMMUNITY)
Admission: RE | Admit: 2019-01-21 | Discharge: 2019-01-21 | Disposition: A | Discharge status: Home / Self Care | Payer: Medicare Other | Source: Ambulatory Visit | Attending: Urology | Admitting: Urology

## 2019-01-21 ENCOUNTER — Ambulatory Visit (HOSPITAL_COMMUNITY)
Admission: RE | Admit: 2019-01-21 | Discharge: 2019-01-21 | Disposition: A | Discharge status: Home / Self Care | Payer: Medicare Other | Source: Ambulatory Visit | Attending: Urology | Admitting: Urology

## 2019-01-21 ENCOUNTER — Other Ambulatory Visit: Payer: Self-pay

## 2019-01-21 DIAGNOSIS — C61 Malignant neoplasm of prostate: Secondary | ICD-10-CM | POA: Insufficient documentation

## 2019-01-21 MED ORDER — TECHNETIUM TC 99M MEDRONATE IV KIT
21.0000 | PACK | Freq: Once | INTRAVENOUS | Status: AC | PRN
  Administered 2019-01-21: 21 via INTRAVENOUS

## 2019-01-26 DIAGNOSIS — R350 Frequency of micturition: Secondary | ICD-10-CM | POA: Diagnosis not present

## 2019-01-26 DIAGNOSIS — C61 Malignant neoplasm of prostate: Secondary | ICD-10-CM | POA: Diagnosis not present

## 2019-01-26 DIAGNOSIS — R300 Dysuria: Secondary | ICD-10-CM | POA: Diagnosis not present

## 2019-01-26 DIAGNOSIS — N401 Enlarged prostate with lower urinary tract symptoms: Secondary | ICD-10-CM | POA: Diagnosis not present

## 2019-01-29 ENCOUNTER — Telehealth: Payer: Self-pay | Admitting: Cardiology

## 2019-01-29 NOTE — Telephone Encounter (Signed)
Spoke with pt an informed that unfortunately our office is currently out of samples. Pt voiced understanding.

## 2019-01-29 NOTE — Telephone Encounter (Signed)
New Message    Patient calling the office for samples of medication:   1.  What medication and dosage are you requesting samples for? Xarelto   2.  Are you currently out of this medication?  Only has a few left

## 2019-02-05 DIAGNOSIS — M545 Low back pain: Secondary | ICD-10-CM | POA: Diagnosis not present

## 2019-02-13 DIAGNOSIS — C61 Malignant neoplasm of prostate: Secondary | ICD-10-CM | POA: Diagnosis not present

## 2019-02-13 DIAGNOSIS — C7951 Secondary malignant neoplasm of bone: Secondary | ICD-10-CM | POA: Diagnosis not present

## 2019-02-23 DIAGNOSIS — M545 Low back pain: Secondary | ICD-10-CM | POA: Diagnosis not present

## 2019-02-23 DIAGNOSIS — M542 Cervicalgia: Secondary | ICD-10-CM | POA: Diagnosis not present

## 2019-03-06 ENCOUNTER — Telehealth: Payer: Self-pay | Admitting: Cardiology

## 2019-03-06 NOTE — Telephone Encounter (Signed)
Medication samples have been provided to the patient.  Drug name: xarelto 20mg   Qty: 2 bottles   LOT: [id number]  Exp.Date: 01/22  Samples left at front desk for patient pick-up. Patient notified.  Sheral Apley M 12:39 PM 03/06/2019

## 2019-03-06 NOTE — Telephone Encounter (Signed)
  Patient calling the office for samples of medication:   1.  What medication and dosage are you requesting samples for?  rivaroxaban (XARELTO) 20 MG TABS tablet  2.  Are you currently out of this medication? Has two pills left

## 2019-03-11 ENCOUNTER — Telehealth: Payer: Self-pay | Admitting: Oncology

## 2019-03-11 NOTE — Telephone Encounter (Signed)
Patients wife called to advise they would not have transportation on 6/30 to come to the appt. Moved 6/30 to 6/26 per patients request.

## 2019-03-12 ENCOUNTER — Telehealth: Payer: Self-pay

## 2019-03-12 NOTE — Telephone Encounter (Signed)
Called and left voicemail regarding pre-screening questions for appt on 6/26  

## 2019-03-13 ENCOUNTER — Telehealth: Payer: Self-pay | Admitting: Oncology

## 2019-03-13 ENCOUNTER — Other Ambulatory Visit: Payer: Self-pay

## 2019-03-13 ENCOUNTER — Other Ambulatory Visit: Payer: Self-pay | Admitting: Internal Medicine

## 2019-03-13 ENCOUNTER — Inpatient Hospital Stay (HOSPITAL_BASED_OUTPATIENT_CLINIC_OR_DEPARTMENT_OTHER): Payer: Medicare Other | Admitting: Oncology

## 2019-03-13 ENCOUNTER — Inpatient Hospital Stay: Payer: Medicare Other | Attending: Oncology

## 2019-03-13 VITALS — BP 188/80 | HR 67 | Temp 98.3°F | Resp 18 | Ht 70.0 in | Wt 190.1 lb

## 2019-03-13 DIAGNOSIS — C61 Malignant neoplasm of prostate: Secondary | ICD-10-CM | POA: Diagnosis not present

## 2019-03-13 DIAGNOSIS — Z79899 Other long term (current) drug therapy: Secondary | ICD-10-CM | POA: Insufficient documentation

## 2019-03-13 DIAGNOSIS — E876 Hypokalemia: Secondary | ICD-10-CM

## 2019-03-13 DIAGNOSIS — Z7984 Long term (current) use of oral hypoglycemic drugs: Secondary | ICD-10-CM | POA: Insufficient documentation

## 2019-03-13 DIAGNOSIS — Z7901 Long term (current) use of anticoagulants: Secondary | ICD-10-CM

## 2019-03-13 DIAGNOSIS — M1990 Unspecified osteoarthritis, unspecified site: Secondary | ICD-10-CM | POA: Diagnosis not present

## 2019-03-13 LAB — CBC WITH DIFFERENTIAL (CANCER CENTER ONLY)
Abs Immature Granulocytes: 0.02 10*3/uL (ref 0.00–0.07)
Basophils Absolute: 0 10*3/uL (ref 0.0–0.1)
Basophils Relative: 1 %
Eosinophils Absolute: 0 10*3/uL (ref 0.0–0.5)
Eosinophils Relative: 1 %
HCT: 32 % — ABNORMAL LOW (ref 39.0–52.0)
Hemoglobin: 10.2 g/dL — ABNORMAL LOW (ref 13.0–17.0)
Immature Granulocytes: 0 %
Lymphocytes Relative: 22 %
Lymphs Abs: 1.3 10*3/uL (ref 0.7–4.0)
MCH: 27.1 pg (ref 26.0–34.0)
MCHC: 31.9 g/dL (ref 30.0–36.0)
MCV: 84.9 fL (ref 80.0–100.0)
Monocytes Absolute: 0.6 10*3/uL (ref 0.1–1.0)
Monocytes Relative: 11 %
Neutro Abs: 3.8 10*3/uL (ref 1.7–7.7)
Neutrophils Relative %: 65 %
Platelet Count: 137 10*3/uL — ABNORMAL LOW (ref 150–400)
RBC: 3.77 MIL/uL — ABNORMAL LOW (ref 4.22–5.81)
RDW: 14.5 % (ref 11.5–15.5)
WBC Count: 5.7 10*3/uL (ref 4.0–10.5)
nRBC: 0 % (ref 0.0–0.2)

## 2019-03-13 LAB — CMP (CANCER CENTER ONLY)
ALT: 22 U/L (ref 0–44)
AST: 25 U/L (ref 15–41)
Albumin: 3.5 g/dL (ref 3.5–5.0)
Alkaline Phosphatase: 75 U/L (ref 38–126)
Anion gap: 10 (ref 5–15)
BUN: 21 mg/dL (ref 8–23)
CO2: 25 mmol/L (ref 22–32)
Calcium: 9.3 mg/dL (ref 8.9–10.3)
Chloride: 105 mmol/L (ref 98–111)
Creatinine: 1.45 mg/dL — ABNORMAL HIGH (ref 0.61–1.24)
GFR, Est AFR Am: 52 mL/min — ABNORMAL LOW (ref >60)
GFR, Estimated: 45 mL/min — ABNORMAL LOW (ref >60)
Glucose, Bld: 333 mg/dL — ABNORMAL HIGH (ref 70–99)
Potassium: 3.9 mmol/L (ref 3.5–5.1)
Sodium: 140 mmol/L (ref 135–145)
Total Bilirubin: 0.3 mg/dL (ref 0.3–1.2)
Total Protein: 7.1 g/dL (ref 6.5–8.1)

## 2019-03-13 NOTE — Telephone Encounter (Signed)
Scheduled per los. Mailed printout  °

## 2019-03-13 NOTE — Progress Notes (Signed)
Hematology and Oncology Follow Up Visit  Adrian Singh [id number] [birth year] 85 y.o. 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  He presented with Gleason score 4+5 = 9 and a PSA of 158.    Prior Therapy:   Androgen deprivation therapy in the form of Lupron under the care of Dr. Diona Fanti at the time of presentation.  Current therapy:  Zytiga 1000 mg daily started in January 2020 for castration-resistant disease and a PSA of 3.29 in December 2019.  Therapy discontinued in June 2020 because of poor tolerance.   Interim History: [name] is here for return evaluation.  Since last visit, he reports no major changes in his health.  He started developing worsening GI symptoms related to Zytiga associated with nausea, vomiting and poor appetite.  He has lost close to 20 pounds and was recommended to hold the treatment.  In the last 3 weeks he is regained his appetite and activity level.  He has regained his weight and back to his baseline function.   He denied any alteration mental status, neuropathy, confusion or dizziness.  Denies any headaches or lethargy.  Denies any night sweats, weight loss or changes in appetite.  Denied orthopnea, dyspnea on exertion or chest discomfort.  Denies shortness of breath, difficulty breathing hemoptysis or cough.  Denies any abdominal distention, nausea, early satiety or dyspepsia.  Denies any hematuria, frequency, dysuria or nocturia.  Denies any skin irritation, dryness or rash.  Denies any ecchymosis or petechiae.  Denies any lymphadenopathy or clotting.  Denies any heat or cold intolerance.  Denies any anxiety or depression.  Remaining review of system is negative.           Medications: I have reviewed the patient's current medications.  Current Outpatient Medications  Medication Sig Dispense Refill  . atorvastatin  (LIPITOR) 40 MG tablet Take 0.5 tablets (20 mg total) by mouth daily. 45 tablet 3  . BAYER MICROLET LANCETS lancets Use to test blood sugar once daily dx: E11.40 100 each 1  . Blood Glucose Monitoring Suppl (CONTOUR NEXT EZ MONITOR) w/Device KIT USE TO TEST BLOOD SUGAR ONCE DAILY. Dx Code E11.40 1 kit 0  . Cholecalciferol (VITAMIN D3) 2000 units TABS Take 2,000 Units by mouth daily.    Marland Kitchen docusate sodium (COLACE) 250 MG capsule Take 250 mg by mouth daily.    . furosemide (LASIX) 40 MG tablet Take 1 tablet (40 mg total) by mouth daily as needed. 15 tablet 0  . glimepiride (AMARYL) 4 MG tablet Take 1 tablet by mouth twice daily 180 tablet 1  . glucose blood (CONTOUR NEXT TEST) test strip Use to check blood sugar once a day. Dx Codes: E11.40, E11.65 100 each 3  . metFORMIN (GLUCOPHAGE) 500 MG tablet Take 1 tablet (500 mg total) by mouth 3 (three) times daily. 270 tablet 3  . metoprolol succinate (TOPROL-XL) 100 MG 24 hr tablet TAKE 1/2 TABLET BY MOUTH ONCE DAILY (TAKE  WITH  OR  IMMEDIATELY  FOLLOWING  A  MEAL) 45 tablet 3  . Multiple Vitamin (MULTIVITAMIN WITH MINERALS) TABS tablet Take 1 tablet by mouth 2 (two) times a week. ONE-A-DAY FOR MEN    . Naproxen Sodium (ALEVE) 220 MG CAPS Take 220 mg by mouth daily.     . nitroGLYCERIN (NITROSTAT) 0.4 MG SL tablet Place 1 tablet (0.4 mg total) under the tongue every 5 (five) minutes as  needed for chest pain. 25 tablet 0  . Pantothenic Acid 500 MG TABS Take by mouth.    . potassium chloride (K-DUR) 10 MEQ tablet Take 1 tablet (10 mEq total) by mouth daily. 30 tablet 3  . predniSONE (DELTASONE) 5 MG tablet Take 1 tablet (5 mg total) by mouth daily with breakfast. 90 tablet 3  . prochlorperazine (COMPAZINE) 10 MG tablet Take 1 tablet (10 mg total) by mouth every 6 (six) hours as needed for nausea or vomiting. 30 tablet 0  . ramipril (ALTACE) 10 MG capsule Take 1 capsule (10 mg total) by mouth daily. 90 capsule 3  . rivaroxaban (XARELTO) 20 MG TABS tablet Take  1 tablet (20 mg total) by mouth daily with supper. 90 tablet 3  . tamsulosin (FLOMAX) 0.4 MG CAPS capsule TAKE 1 CAPSULE BY MOUTH ONCE DAILY 90 capsule 3  . traMADol (ULTRAM) 50 MG tablet TAKE 1 TABLET BY MOUTH THREE TIMES DAILY AS NEEDED 30 tablet 0  . ZYTIGA 250 MG tablet TAKE 4 TABLETS BY MOUTH ONCE DAILY  ON AN EMPTY STOMACH  AS DIRECTED.  TAKE 1 HOUR BEFORE OR 2 HOURS AFTER A MEAL 120 tablet 11   No current facility-administered medications for this visit.      Allergies:  Allergies  Allergen Reactions  . Soma [Carisoprodol] Other (See Comments)    "did a number on me"  . Doxazosin Other (See Comments)    dizziness    Past Medical History, Surgical history, Social history, and Family History were reviewed and updated.    Physical Exam:  Blood pressure (!) 188/80, pulse 67, temperature 98.3 F (36.8 C), temperature source Oral, resp. rate 18, height '5\' 10"'  (1.778 m), weight 190 lb 1.6 oz (86.2 kg), SpO2 98 %.   ECOG: 1     General appearance: Comfortable appearing without any discomfort Head: Normocephalic without any trauma Oropharynx: Mucous membranes are moist and pink without any thrush or ulcers. Eyes: Pupils are equal and round reactive to light. Lymph nodes: No cervical, supraclavicular, inguinal or axillary lymphadenopathy.   Heart:regular rate and rhythm.  S1 and S2 without leg edema. Lung: Clear without any rhonchi or wheezes.  No dullness to percussion. Abdomin: Soft, nontender, nondistended with good bowel sounds.  No hepatosplenomegaly. Musculoskeletal: No joint deformity or effusion.  Full range of motion noted. Neurological: No deficits noted on motor, sensory and deep tendon reflex exam. Skin: No petechial rash or dryness.  Appeared moist.       Lab Results: Lab Results  Component Value Date   WBC 6.9 12/11/2018   HGB 12.0 (L) 12/11/2018   HCT 37.7 (L) 12/11/2018   MCV 89.1 12/11/2018   PLT 154 12/11/2018     Chemistry      Component  Value Date/Time   NA 143 12/11/2018 1117   K 3.2 (L) 12/11/2018 1117   CL 104 12/11/2018 1117   CO2 26 12/11/2018 1117   BUN 24 (H) 12/11/2018 1117   CREATININE 1.33 (H) 12/11/2018 1117   CREATININE 1.57 (H) 10/05/2015 1047      Component Value Date/Time   CALCIUM 9.6 12/11/2018 1117   ALKPHOS 63 12/11/2018 1117   AST 29 12/11/2018 1117   ALT 22 12/11/2018 1117   BILITOT 0.7 12/11/2018 1117     Results for PERKINS, MOLINA (MRN [id number]) as of 03/13/2019 09:34  Ref. Range 01/14/2017 13:49 11/13/2018 09:39 12/11/2018 11:17  PSA Latest Ref Range: 0.00 - 4.00 ng/mL 158.00 (H)    Prostate  Specific Ag, Serum Latest Ref Range: 0.0 - 4.0 ng/mL  1.0 1.1      Impression and Plan:  85 year old man with:  1.  Advanced prostate cancer with disease to the bone diagnosed in 2018.  He has castration hyper resistant disease.     He was started on Zytiga in December 2019 with reasonable response his PSA down to 1.0.  In the last few months he started developing more GI complaints and therapy withheld for the last 4 weeks.  The natural course of his disease and treatment options were reviewed at this time.  His options would include dose reduction of his Zytiga by 50 to 75%, using Xtandi or systemic chemotherapy.  After discussion today, he wants to retry Zytiga at a lower dose which is reasonable at this time.  I instructed him to use 250 mg daily and escalate to a maximum of 500 mg.  If he continues to have GI complaints, we will proceed with a different therapy.  He is agreeable with this plan.  2.  Androgen deprivation: He continues to receive that under the care of Dr. Diona Fanti.  I recommended continuing this indefinitely.  3.  Bone directed therapy: No issues associated with Delton See which he continues to receive in the care of Dr. Diona Fanti.    4.  Hip pain: Mostly related to osteoarthritis.  Pain is manageable.  5.  Prognosis and goals of care: Therapy remains palliative although  aggressive measures are warranted given his reasonable performance status.  6.  Hypokalemia: Related to Zytiga which will be checked periodically.  7.  Follow-up: We will be in 2 months for repeat evaluation.  25  minutes was spent with the patient face-to-face today.  More than 50% of time was dedicated to discussing his disease status, treatment options answering questions regarding future plan of care.      Zola Button, MD 6/26/20209:31 AM

## 2019-03-14 LAB — PROSTATE-SPECIFIC AG, SERUM (LABCORP): Prostate Specific Ag, Serum: 1.2 ng/mL (ref 0.0–4.0)

## 2019-03-17 ENCOUNTER — Ambulatory Visit: Payer: Medicare Other | Admitting: Oncology

## 2019-03-17 ENCOUNTER — Other Ambulatory Visit: Payer: Medicare Other

## 2019-03-17 DIAGNOSIS — Z5111 Encounter for antineoplastic chemotherapy: Secondary | ICD-10-CM | POA: Diagnosis not present

## 2019-03-17 DIAGNOSIS — C61 Malignant neoplasm of prostate: Secondary | ICD-10-CM | POA: Diagnosis not present

## 2019-03-17 DIAGNOSIS — C7951 Secondary malignant neoplasm of bone: Secondary | ICD-10-CM | POA: Diagnosis not present

## 2019-03-19 ENCOUNTER — Telehealth: Payer: Self-pay

## 2019-03-19 MED ORDER — CONTOUR NEXT TEST VI STRP: ORAL_STRIP | 1 refills | Status: DC

## 2019-03-19 NOTE — Telephone Encounter (Signed)
Adrian Singh pts daughter said pt has not had test strips for a while and was requested on 03/13/19. Judy request refill for test strips to be done so pt can ck BS.

## 2019-03-19 NOTE — Telephone Encounter (Signed)
I spoke with Adrian Singh at [facility] [address] and she said there were no directions on test strips. I will resend now with directions from lancets and meter with dx code. Adrian Singh said can pick up test strips today. I notified Adrian Singh and she was appreciative and will pick up test strips today.

## 2019-03-23 ENCOUNTER — Telehealth: Payer: Self-pay | Admitting: Cardiology

## 2019-03-23 ENCOUNTER — Telehealth: Payer: Self-pay

## 2019-03-23 NOTE — Telephone Encounter (Signed)
Received call from patient requesting most recent labs results which were reviewed with the patient. He then stated that he has been taking the 250mg  dose of Zytiga and again has no appetite, weak, no energy, and swollen ankles. He stated that he understands that Dr. Alen Blew is out of the office until Monday and prefers to remain off the [place] until Dr. Alen Blew returns and provides further direction. Provided the patient with his future appointment date and time as well.

## 2019-03-23 NOTE — Telephone Encounter (Signed)
  Patient calling the office for samples of medication:   1.  What medication and dosage are you requesting samples for? rivaroxaban (XARELTO) 20 MG TABS tablet  2.  Are you currently out of this medication? Has one for tonight but will be out

## 2019-03-23 NOTE — Telephone Encounter (Signed)
No xarelto samples are available at this time. Patient has Rx at [facility] he will pick up

## 2019-03-27 ENCOUNTER — Other Ambulatory Visit: Payer: Self-pay

## 2019-03-27 MED ORDER — PROCHLORPERAZINE MALEATE 10 MG PO TABS: 10.0000 mg | ORAL_TABLET | Freq: Four times a day (QID) | ORAL | 0 refills | Status: DC | PRN

## 2019-03-27 NOTE — Telephone Encounter (Signed)
Return call to patient to confirm that he stopped taking the Zytiga as of Monday. Patient stated that his last dose was on Sunday. He stated that he still has minimal appetite and nausea after eating anything. Recommended that he take the Compazine prior to eating to prevent or minimize nausea in addition to prn but not to exceed the prescribed doses. Patient stated that he will need a refill of the compazine so this was sent to the requested pharmacy. Patient verbalized understanding and knows to call back with any other questions or concerns.

## 2019-03-27 NOTE — Telephone Encounter (Signed)
-----   Message from Wyatt Portela, MD sent at 03/26/2019  5:54 PM EDT ----- Regarding: RE: Zytiga Please let him know to stop it ASAP. Thanks ----- Message ----- From: Scot Dock, RN Sent: 03/23/2019   3:27 PM EDT To: Wyatt Portela, MD Subject: Adrian Singh                                         Patient stated that he is not able to tolerate the Zytiga 250mg  he started again on 6/26. He has no appetite, weak, and swollen ankles. He did not take dose today and prefers to stay off the Zytiga understanding that you will return on Monday for further direction at that time. Thanks

## 2019-04-06 ENCOUNTER — Ambulatory Visit (INDEPENDENT_AMBULATORY_CARE_PROVIDER_SITE_OTHER): Payer: Medicare Other | Admitting: Internal Medicine

## 2019-04-06 ENCOUNTER — Encounter: Payer: Self-pay | Admitting: Internal Medicine

## 2019-04-06 ENCOUNTER — Other Ambulatory Visit: Payer: Self-pay

## 2019-04-06 VITALS — BP 118/76 | HR 64 | Temp 98.0°F | Ht 70.0 in | Wt 175.0 lb

## 2019-04-06 DIAGNOSIS — D696 Thrombocytopenia, unspecified: Secondary | ICD-10-CM

## 2019-04-06 DIAGNOSIS — Z0001 Encounter for general adult medical examination with abnormal findings: Secondary | ICD-10-CM

## 2019-04-06 DIAGNOSIS — E08621 Diabetes mellitus due to underlying condition with foot ulcer: Secondary | ICD-10-CM

## 2019-04-06 DIAGNOSIS — Z7189 Other specified counseling: Secondary | ICD-10-CM

## 2019-04-06 DIAGNOSIS — I480 Paroxysmal atrial fibrillation: Secondary | ICD-10-CM | POA: Diagnosis not present

## 2019-04-06 DIAGNOSIS — N183 Chronic kidney disease, stage 3 unspecified: Secondary | ICD-10-CM

## 2019-04-06 DIAGNOSIS — C61 Malignant neoplasm of prostate: Secondary | ICD-10-CM

## 2019-04-06 DIAGNOSIS — L97401 Non-pressure chronic ulcer of unspecified heel and midfoot limited to breakdown of skin: Secondary | ICD-10-CM | POA: Diagnosis not present

## 2019-04-06 DIAGNOSIS — F39 Unspecified mood [affective] disorder: Secondary | ICD-10-CM | POA: Insufficient documentation

## 2019-04-06 DIAGNOSIS — E11621 Type 2 diabetes mellitus with foot ulcer: Secondary | ICD-10-CM | POA: Insufficient documentation

## 2019-04-06 DIAGNOSIS — E1140 Type 2 diabetes mellitus with diabetic neuropathy, unspecified: Secondary | ICD-10-CM

## 2019-04-06 DIAGNOSIS — Z0000 Encounter for general adult medical examination without abnormal findings: Secondary | ICD-10-CM

## 2019-04-06 DIAGNOSIS — E1165 Type 2 diabetes mellitus with hyperglycemia: Secondary | ICD-10-CM

## 2019-04-06 DIAGNOSIS — E441 Mild protein-calorie malnutrition: Secondary | ICD-10-CM | POA: Insufficient documentation

## 2019-04-06 DIAGNOSIS — E1122 Type 2 diabetes mellitus with diabetic chronic kidney disease: Secondary | ICD-10-CM | POA: Diagnosis not present

## 2019-04-06 DIAGNOSIS — C7951 Secondary malignant neoplasm of bone: Secondary | ICD-10-CM

## 2019-04-06 DIAGNOSIS — I25119 Atherosclerotic heart disease of native coronary artery with unspecified angina pectoris: Secondary | ICD-10-CM | POA: Diagnosis not present

## 2019-04-06 DIAGNOSIS — L84 Corns and callosities: Secondary | ICD-10-CM | POA: Diagnosis not present

## 2019-04-06 DIAGNOSIS — IMO0002 Reserved for concepts with insufficient information to code with codable children: Secondary | ICD-10-CM

## 2019-04-06 LAB — POCT GLYCOSYLATED HEMOGLOBIN (HGB A1C): Hemoglobin A1C: 7.7 % — AB (ref 4.0–5.6)

## 2019-04-06 LAB — HM DIABETES FOOT EXAM

## 2019-04-06 MED ORDER — METOPROLOL SUCCINATE ER 25 MG PO TB24: 25.0000 mg | ORAL_TABLET | Freq: Every day | ORAL | 3 refills | Status: DC

## 2019-04-06 NOTE — Assessment & Plan Note (Signed)
Has not tolerated the zytiga Has stopped for now

## 2019-04-06 NOTE — Assessment & Plan Note (Signed)
Mostly sounds regular Rate slow Will cut the metoprolol to 25 especially with low BP and orthostatic weakness On the blood thinner

## 2019-04-06 NOTE — Assessment & Plan Note (Signed)
I have personally reviewed the Medicare Annual Wellness questionnaire and have noted 1. The patient's medical and social history 2. Their use of alcohol, tobacco or illicit drugs 3. Their current medications and supplements 4. The patient's functional ability including ADL's, fall risks, home safety risks and hearing or visual             impairment. 5. Diet and physical activities 6. Evidence for depression or mood disorders  The patients weight, height, BMI and visual acuity have been recorded in the chart I have made referrals, counseling and provided education to the patient based review of the above and I have provided the pt with a written personalized care plan for preventive services.  I have provided you with a copy of your personalized plan for preventive services. Please take the time to review along with your updated medication list.  Will need flu vaccine in the fall Hopefully will regain weight and some strength Done with colon screening

## 2019-04-06 NOTE — Assessment & Plan Note (Signed)
Will accept A1c up to 9%  No hypoglycemia on the medications

## 2019-04-06 NOTE — Assessment & Plan Note (Signed)
Recent GFR is stable No action on this

## 2019-04-06 NOTE — Assessment & Plan Note (Signed)
Mild and stable No obvious abnormal bleeding

## 2019-04-06 NOTE — Progress Notes (Signed)
Subjective:    Patient ID: Adrian Singh, male    DOB: Apr 28, 1934, 85 y.o.   MRN: [id number]  HPI Here for Medicare wellness visit and follow up of chronic health conditions Reviewed form and advanced directives Reviewed other doctors No alcohol or tobacco Not able to exercise Vision is okay--but has "lazy" left eye Hearing is "terrible". Bad chronic tinnitus. Considering another amplifier [place] once going to his garden--tripped over root. No injury Not able to do yard work anymore Children getting groceries--wife does most of the housework but he helps No sig memory issues  Does get depressed at times With his illness, etc Not every day Not anhedonic Wife not doing great either--so that is stressful  Has lost over 25# in the past 6 months Appetite is gone with his prostate cancer medication Tried lower dose of zytiga--but he still got sick on it Now has stopped Still gets lupron shots---also xgeva (for the bone) PSA did go up slightly  Now getting dizzy easily Had to get into wheelchair here after standing to get his weight Mostly feels it when getting up quick No recent chest pain No SOB Some edema---better now. Hasn't used furosemide in 1 month or so No recent palpitations Known aortic atherosclerosis as well---radiologic diagnosis  Continues on xarelto for a fib Does bruise easily---but no abnormal bleeding  Checks sugars Running from 120-190 Rarely over 200 No hypoglycemic reactions Known CKD 3 ---creatinine stable on recent labs  Current Outpatient Medications on File Prior to Visit  Medication Sig Dispense Refill  . atorvastatin (LIPITOR) 40 MG tablet Take 0.5 tablets (20 mg total) by mouth daily. 45 tablet 3  . BAYER MICROLET LANCETS lancets Use to test blood sugar once daily dx: E11.40 100 each 1  . Blood Glucose Monitoring Suppl (CONTOUR NEXT EZ MONITOR) w/Device KIT USE TO TEST BLOOD SUGAR ONCE DAILY. Dx Code E11.40 1 kit 0  . Cholecalciferol  (VITAMIN D3) 2000 units TABS Take 2,000 Units by mouth daily.    Marland Kitchen docusate sodium (COLACE) 250 MG capsule Take 250 mg by mouth daily.    . furosemide (LASIX) 40 MG tablet Take 1 tablet (40 mg total) by mouth daily as needed. 15 tablet 0  . glimepiride (AMARYL) 4 MG tablet Take 1 tablet by mouth twice daily 180 tablet 1  . glucose blood (CONTOUR NEXT TEST) test strip Check blood sugar once daily and as directed.Dx Code E11.40 100 each 1  . metFORMIN (GLUCOPHAGE) 500 MG tablet Take 1 tablet (500 mg total) by mouth 3 (three) times daily. 270 tablet 3  . metoprolol succinate (TOPROL-XL) 100 MG 24 hr tablet TAKE 1/2 TABLET BY MOUTH ONCE DAILY (TAKE  WITH  OR  IMMEDIATELY  FOLLOWING  A  MEAL) 45 tablet 3  . Multiple Vitamin (MULTIVITAMIN WITH MINERALS) TABS tablet Take 1 tablet by mouth 2 (two) times a week. ONE-A-DAY FOR MEN    . Naproxen Sodium (ALEVE) 220 MG CAPS Take 220 mg by mouth daily.     . nitroGLYCERIN (NITROSTAT) 0.4 MG SL tablet Place 1 tablet (0.4 mg total) under the tongue every 5 (five) minutes as needed for chest pain. 25 tablet 0  . Pantothenic Acid 500 MG TABS Take by mouth.    . potassium chloride (K-DUR) 10 MEQ tablet Take 1 tablet (10 mEq total) by mouth daily. 30 tablet 3  . prochlorperazine (COMPAZINE) 10 MG tablet Take 1 tablet (10 mg total) by mouth every 6 (six) hours as needed for nausea  or vomiting. 30 tablet 0  . ramipril (ALTACE) 10 MG capsule Take 1 capsule (10 mg total) by mouth daily. 90 capsule 3  . rivaroxaban (XARELTO) 20 MG TABS tablet Take 1 tablet (20 mg total) by mouth daily with supper. 90 tablet 3  . tamsulosin (FLOMAX) 0.4 MG CAPS capsule TAKE 1 CAPSULE BY MOUTH ONCE DAILY 90 capsule 3  . traMADol (ULTRAM) 50 MG tablet TAKE 1 TABLET BY MOUTH THREE TIMES DAILY AS NEEDED 30 tablet 0  . ZYTIGA 250 MG tablet TAKE 4 TABLETS BY MOUTH ONCE DAILY  ON AN EMPTY STOMACH  AS DIRECTED.  TAKE 1 HOUR BEFORE OR 2 HOURS AFTER A MEAL 120 tablet 11   No current  facility-administered medications on file prior to visit.     Allergies  Allergen Reactions  . Soma [Carisoprodol] Other (See Comments)    "did a number on me"  . Doxazosin Other (See Comments)    dizziness    Past Medical History:  Diagnosis Date  . Arthritis    "legs, back" (10/06/2015)  . Atrial fibrillation ([place])    x1  . CAD (coronary artery disease)    nonobstructive  . Cancer ([place])    skin cancer on ear (froze it off) and back (cut it off)  . Dysrhythmia   . Esophageal reflux    hx of  . History of kidney stones   . Hx of heart artery stent   . Hypertrophy of prostate without urinary obstruction and other lower urinary tract symptoms (LUTS)   . Osteoarthrosis, unspecified whether generalized or localized, unspecified site   . Prostate cancer ([place])   . Skin cancer   . Stroke [facility]) 03/2014   "had a series of mini strokes; maybe 4"; denies residual on 10/06/2015  . Thrombocytopenia ([place])   . Type II diabetes mellitus ([place])    type 2  . Unspecified essential hypertension     Past Surgical History:  Procedure Laterality Date  . BACK SURGERY     2001  . CARDIAC CATHETERIZATION  12/2007  . CARDIAC CATHETERIZATION N/A 09/22/2015   Procedure: Left Heart Cath and Coronary Angiography;  Surgeon: Peter M Martinique, MD;  Location: [place] [facility];  Service: Cardiovascular;  Laterality: N/A;  . CARDIAC CATHETERIZATION N/A 10/06/2015   Procedure: Coronary Stent Intervention;  Surgeon: Peter M Martinique, MD;  Location: [place] [facility];  Service: Cardiovascular;  Laterality: N/A;  . CARDIAC CATHETERIZATION N/A 02/29/2016   Procedure: Left Heart Cath and Coronary Angiography;  Surgeon: Jettie Booze, MD;  Location: [place] [facility];  Service: Cardiovascular;  Laterality: N/A;  . COLONOSCOPY W/ BIOPSIES AND POLYPECTOMY    . CORONARY STENT PLACEMENT  10/06/2015   [name]  with DES  . EAR CYST EXCISION N/A 04/06/2015   Procedure: EXCISION OF SCALP CYST;  Surgeon: Donnie Mesa, MD;   Location: [place];  Service: General;  Laterality: N/A;  . ESOPHAGOGASTRODUODENOSCOPY (EGD) WITH ESOPHAGEAL DILATION  2001   with dilation  . LUMBAR DISC SURGERY  09/26/1999   "cleaned out arthritis and bone spurs"  . SHOULDER ARTHROSCOPY W/ ROTATOR CUFF REPAIR Left 2005  . THULIUM LASER TURP (TRANSURETHRAL RESECTION OF PROSTATE) N/A 01/21/2017   Procedure: Marcelino Duster LASER TURP (TRANSURETHRAL RESECTION OF PROSTATE) CAUTERIZATION OF BLADDER LESION;  Surgeon: Franchot Gallo, MD;  Location[facility];  Service: Urology;  Laterality: N/A;    Family History  Problem Relation Age of Onset  . Stroke Father   . Peripheral vascular disease  Father        amputation  . Heart failure Mother        CHF  . Coronary artery disease Mother   . Heart attack Mother 20       Multiple  . COPD Brother   . Heart disease Brother   . Cancer Brother        Bone  . Lung cancer Sister     Social History   Socioeconomic History  . Marital status: Married    Spouse name: Not on file  . Number of children: 4  . Years of education: Not on file  . Highest education level: Not on file  Occupational History  . Occupation: Radiographer, 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[employer]    Employer: [employer]  . Financial resource strain: Not on file  . Food insecurity    Worry: Not on file    Inability: Not on file  . Transportation needs    Medical: Not on file    Non-medical: Not on file  Tobacco Use  . Smoking status: Former Smoker    Years: 3.00    Types: Cigarettes  . Smokeless tobacco: Never Used  . Tobacco comment: " Quit smoking by age 33; was a someday smoker "  Substance and Sexual Activity  . Alcohol use: No  . Drug use: No  . Sexual activity: Never  Lifestyle  . Physical activity    Days per week: Not on file    Minutes per session: Not on file  . Stress: Not on file  Relationships  . Social Herbalist on phone: Not on file    Gets together: Not on file    Attends religious service: Not on file     Active member of club or organization: Not on file    Attends meetings of clubs or organizations: Not on file    Relationship status: Not on file  . Intimate partner violence    Fear of current or ex partner: Not on file    Emotionally abused: Not on file    Physically abused: Not on file    Forced sexual activity: Not on file  Other Topics Concern  . Not on file  Social History Narrative   No living will   Plans wife and then children to make health care decisions for him if unable   Would request at least attempts at resuscitation but no prolonged life support   Doesn't think he would want tube feeds if cognitively unaware   Review of Systems Sleeps okay---plenty Wears seat belt Mouth gets dry--teeth okay. No skin lesions or rashes---just has spur in foot Various joint pains Bowels are moving okay---no blood Voids fairly well---nocturia x 2-3 times. Flow is okay No heartburn or dysphagia. Did have some nausea    Objective:   Physical Exam  Constitutional: He is oriented to person, place, and time.  Clear wasting and pale  HENT:  Mouth/Throat: Oropharynx is clear and moist. No oropharyngeal exudate.  Full dentures  Neck: No thyromegaly present.  Cardiovascular: Normal rate. Exam reveals no gallop.  No murmur heard. Regular with some skips Rate ~60 1+ pulse on left, trace on right  Respiratory: Effort normal and breath sounds normal. No respiratory distress. He has no wheezes. He has no rales.  GI: Soft. There is no abdominal tenderness.  Musculoskeletal:        General: No tenderness or edema.  Lymphadenopathy:    He has no  cervical adenopathy.  Neurological: He is alert and oriented to person, place, and time.  President--- "Franz Dell" [phone]- "not great at math" D-[id number] Recall 3/3  Weak today Trouble getting up on table Sensation in feet reasonably intact  Skin:  Large plantar callous with small ulcer in the center Dried blood  Smells bad Not inflamed or tender           Assessment & Plan:

## 2019-04-06 NOTE — Assessment & Plan Note (Signed)
~  2 x 2 cm with central ulcer  PROCEDURE Verbal consent Sterile scalpel used to debride all the devitalized tissue of the callous and surrounding the ulcer Tolerated well Covered with antibiotic ointment and band aid

## 2019-04-06 NOTE — Assessment & Plan Note (Signed)
No recent chest pain Limited activity now due to weakness

## 2019-04-06 NOTE — Assessment & Plan Note (Signed)
Has lost over 25# due to the medication Now stopped Hopefully he will regain some weight and strength

## 2019-04-06 NOTE — Assessment & Plan Note (Signed)
See social history 

## 2019-04-06 NOTE — Assessment & Plan Note (Signed)
Has been affected by COVID, his recalcitrant metastatic prostate cancer and wife's decline as well No MDD Dysthymia but no clear indication for medications at this point

## 2019-04-17 DIAGNOSIS — C7951 Secondary malignant neoplasm of bone: Secondary | ICD-10-CM | POA: Diagnosis not present

## 2019-04-17 DIAGNOSIS — C61 Malignant neoplasm of prostate: Secondary | ICD-10-CM | POA: Diagnosis not present

## 2019-04-20 ENCOUNTER — Encounter: Payer: Self-pay | Admitting: Internal Medicine

## 2019-04-20 ENCOUNTER — Telehealth: Payer: Self-pay | Admitting: Cardiology

## 2019-04-20 ENCOUNTER — Other Ambulatory Visit: Payer: Self-pay

## 2019-04-20 ENCOUNTER — Ambulatory Visit (INDEPENDENT_AMBULATORY_CARE_PROVIDER_SITE_OTHER): Payer: Medicare Other | Admitting: Internal Medicine

## 2019-04-20 DIAGNOSIS — L97401 Non-pressure chronic ulcer of unspecified heel and midfoot limited to breakdown of skin: Secondary | ICD-10-CM | POA: Diagnosis not present

## 2019-04-20 DIAGNOSIS — E08621 Diabetes mellitus due to underlying condition with foot ulcer: Secondary | ICD-10-CM

## 2019-04-20 NOTE — Progress Notes (Signed)
Subjective:    Patient ID: Adrian Singh, male    DOB: 1934/06/18, 85 y.o.   MRN: [id number]  HPI Here for follow up of foot ulcer  He is eating a lot better Hasn't lost any more weight He is able to do a little more--able to mow lawn on riding mower Still independent at home---children do the shopping for them though  Right foot ulcer is okay He is just keeping a bandaid on it Usually not painful--but occasionally has pain if touches it a certain way Still very limited sensation  Current Outpatient Medications on File Prior to Visit  Medication Sig Dispense Refill   atorvastatin (LIPITOR) 40 MG tablet Take 0.5 tablets (20 mg total) by mouth daily. 45 tablet 3   BAYER MICROLET LANCETS lancets Use to test blood sugar once daily dx: E11.40 100 each 1   Blood Glucose Monitoring Suppl (CONTOUR NEXT EZ MONITOR) w/Device KIT USE TO TEST BLOOD SUGAR ONCE DAILY. Dx Code E11.40 1 kit 0   Cholecalciferol (VITAMIN D3) 2000 units TABS Take 2,000 Units by mouth daily.     docusate sodium (COLACE) 250 MG capsule Take 250 mg by mouth daily.     furosemide (LASIX) 40 MG tablet Take 1 tablet (40 mg total) by mouth daily as needed. 15 tablet 0   glimepiride (AMARYL) 4 MG tablet Take 1 tablet by mouth twice daily 180 tablet 1   glucose blood (CONTOUR NEXT TEST) test strip Check blood sugar once daily and as directed.Dx Code E11.40 100 each 1   metFORMIN (GLUCOPHAGE) 500 MG tablet Take 1 tablet (500 mg total) by mouth 3 (three) times daily. 270 tablet 3   metoprolol succinate (TOPROL-XL) 25 MG 24 hr tablet Take 1 tablet (25 mg total) by mouth daily. 90 tablet 3   Multiple Vitamin (MULTIVITAMIN WITH MINERALS) TABS tablet Take 1 tablet by mouth 2 (two) times a week. ONE-A-DAY FOR MEN     Naproxen Sodium (ALEVE) 220 MG CAPS Take 220 mg by mouth daily.      nitroGLYCERIN (NITROSTAT) 0.4 MG SL tablet Place 1 tablet (0.4 mg total) under the tongue every 5 (five) minutes as needed for chest  pain. 25 tablet 0   Pantothenic Acid 500 MG TABS Take by mouth.     potassium chloride (K-DUR) 10 MEQ tablet Take 1 tablet (10 mEq total) by mouth daily. 30 tablet 3   prochlorperazine (COMPAZINE) 10 MG tablet Take 1 tablet (10 mg total) by mouth every 6 (six) hours as needed for nausea or vomiting. 30 tablet 0   ramipril (ALTACE) 10 MG capsule Take 1 capsule (10 mg total) by mouth daily. 90 capsule 3   rivaroxaban (XARELTO) 20 MG TABS tablet Take 1 tablet (20 mg total) by mouth daily with supper. 90 tablet 3   tamsulosin (FLOMAX) 0.4 MG CAPS capsule TAKE 1 CAPSULE BY MOUTH ONCE DAILY 90 capsule 3   traMADol (ULTRAM) 50 MG tablet TAKE 1 TABLET BY MOUTH THREE TIMES DAILY AS NEEDED 30 tablet 0   ZYTIGA 250 MG tablet TAKE 4 TABLETS BY MOUTH ONCE DAILY  ON AN EMPTY STOMACH  AS DIRECTED.  TAKE 1 HOUR BEFORE OR 2 HOURS AFTER A MEAL 120 tablet 11   No current facility-administered medications on file prior to visit.     Allergies  Allergen Reactions   Soma [Carisoprodol] Other (See Comments)    "did a number on me"   Doxazosin Other (See Comments)    dizziness  Past Medical History:  Diagnosis Date   Arthritis    "legs, back" (10/06/2015)   Atrial fibrillation ([facility])    x1   CAD (coronary artery disease)    nonobstructive   Cancer ([facility])    skin cancer on ear (froze it off) and back (cut it off)   Dysrhythmia    Esophageal reflux    hx of   History of kidney stones    Hx of heart artery stent    Hypertrophy of prostate without urinary obstruction and other lower urinary tract symptoms (LUTS)    Osteoarthrosis, unspecified whether generalized or localized, unspecified site    Prostate cancer ([place])    Skin cancer    Stroke ([place]) 03/2014   "had a series of mini strokes; maybe 4"; denies residual on 10/06/2015   Thrombocytopenia ([place])    Type II diabetes mellitus ([place])    type 2   Unspecified essential hypertension     Past Surgical History:  Procedure  Laterality Date   BACK SURGERY     2001   CARDIAC CATHETERIZATION  12/2007   CARDIAC CATHETERIZATION N/A 09/22/2015   Procedure: Left Heart Cath and Coronary Angiography;  Surgeon: Peter M Martinique, MD;  Location: [place] [facility];  Service: Cardiovascular;  Laterality: N/A;   CARDIAC CATHETERIZATION N/A 10/06/2015   Procedure: Coronary Stent Intervention;  Surgeon: Peter M Martinique, MD;  Location: [place] [facility];  Service: Cardiovascular;  Laterality: N/A;   CARDIAC CATHETERIZATION N/A 02/29/2016   Procedure: Left Heart Cath and Coronary Angiography;  Surgeon: Jettie Booze, MD;  Location: [place] [facility];  Service: Cardiovascular;  Laterality: N/A;   COLONOSCOPY W/ BIOPSIES AND POLYPECTOMY     CORONARY STENT PLACEMENT  10/06/2015   [name]  with DES   EAR CYST EXCISION N/A 04/06/2015   Procedure: EXCISION OF SCALP CYST;  Surgeon: Donnie Mesa, MD;  Location: [place];  Service: General;  Laterality: N/A;   ESOPHAGOGASTRODUODENOSCOPY (EGD) WITH ESOPHAGEAL DILATION  2001   with dilation   LUMBAR DISC SURGERY  09/26/1999   "cleaned out arthritis and bone spurs"   SHOULDER ARTHROSCOPY W/ ROTATOR CUFF REPAIR Left 2005   THULIUM LASER TURP (TRANSURETHRAL RESECTION OF PROSTATE) N/A 01/21/2017   Procedure: Marcelino Duster LASER TURP (TRANSURETHRAL RESECTION OF PROSTATE) CAUTERIZATION OF BLADDER LESION;  Surgeon: Franchot Gallo, MD;  [facility];  Service: Urology;  Laterality: N/A;    Family History  Problem Relation Age of Onset   Stroke Father    Peripheral vascular disease Father        amputation   Heart failure Mother        CHF   Coronary artery disease Mother    Heart attack Mother 42       Multiple   COPD Brother    Heart disease Brother    Cancer Brother        Bone   Lung cancer Sister     Social History   Socioeconomic History   Marital status: Married    Spouse name: Not on file   Number of children: 4   Years of education: Not on file    Highest education level: Not on file  Occupational History   Occupation: Radiographer, 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[employer]    Employer: RETIRED  Social Needs   Financial resource strain: Not on file   Food insecurity    Worry: Not on file    Inability: Not on file   Transportation needs  Medical: Not on file    Non-medical: Not on file  Tobacco Use   Smoking status: Former Smoker    Years: 3.00    Types: Cigarettes   Smokeless tobacco: Never Used   Tobacco comment: " Quit smoking by age 57; was a someday smoker "  Substance and Sexual Activity   Alcohol use: No   Drug use: No   Sexual activity: Never  Lifestyle   Physical activity    Days per week: Not on file    Minutes per session: Not on file   Stress: Not on file  Relationships   Social connections    Talks on phone: Not on file    Gets together: Not on file    Attends religious service: Not on file    Active member of club or organization: Not on file    Attends meetings of clubs or organizations: Not on file    Relationship status: Not on file   Intimate partner violence    Fear of current or ex partner: Not on file    Emotionally abused: Not on file    Physically abused: Not on file    Forced sexual activity: Not on file  Other Topics Concern   Not on file  Social History Narrative   No living will   Plans wife and then children to make health care decisions for him if unable   Would request at least attempts at resuscitation but no prolonged life support   Doesn't think he would want tube feeds if cognitively unaware   Review of Systems  No fever Sleeps okay     Objective:   Physical Exam  Constitutional: He appears well-developed. No distress.  Skin:  Right plantar foot--- callous has not reformed Central firm dark eschar--no fluctuance, redness or significant tenderness           Assessment & Plan:

## 2019-04-20 NOTE — Telephone Encounter (Signed)
Medication samples have been provided to the patient.  Drug name: xarelto 20mg   Qty: 2 bottles  LOT: [id number]  Exp.Date: 06/2021  Samples left at front desk for patient pick-up. Patient notified.  Sheral Apley M 4:06 PM 04/20/2019

## 2019-04-20 NOTE — Assessment & Plan Note (Signed)
This looks much better No clear infection Callous has not reformed---discussed that he needs to come in for paring down the callous if it reforms (actually had stuff under it causing the discharge, etc). Now firm, dry and eschar. Not sure if there could be a wart in the center--but not excited about cryotherapy. No indication of infection

## 2019-04-20 NOTE — Telephone Encounter (Signed)
  Patient calling the office for samples of medication:   1.  What medication and dosage are you requesting samples for? rivaroxaban (XARELTO) 20 MG TABS tablet  2.  Are you currently out of this medication? Only has a couple left

## 2019-05-04 ENCOUNTER — Telehealth: Payer: Self-pay | Admitting: Cardiology

## 2019-05-04 NOTE — Telephone Encounter (Signed)
New message   Patient calling the office for samples of medication:   1.  What medication and dosage are you requesting samples for?rivaroxaban (XARELTO) 20 MG TABS tablet  2.  Are you currently out of this medication? No, patient has 2 tablets

## 2019-05-04 NOTE — Telephone Encounter (Signed)
Informed pt that unfortunately we don't have any samples at this time. Pt voiced understanding and state he will try to call back.

## 2019-05-06 ENCOUNTER — Telehealth: Payer: Self-pay | Admitting: Cardiology

## 2019-05-06 NOTE — Telephone Encounter (Signed)
Disregard opened in error °

## 2019-05-08 NOTE — Telephone Encounter (Signed)
Oral Oncology Patient Advocate Encounter  The patient called to let me know that he has stopped Zytiga but [name] and [name] keeps sending him medicine even after telling them he is no longer taking it.  I called [employer] and cancelled the patients enrollment.  I let the patient know that it has been cancelled and he can bring the unopened bottles to [place] to use for someone else. He said he would love to do that instead of throw them away, he has 4-5 bottles he will bring.  The patient verbalized appreciation  Margy Clarks [facility] Patient [place] Phone [phone] Fax [phone] 05/08/2019   1:19 PM

## 2019-05-15 ENCOUNTER — Inpatient Hospital Stay: Payer: Medicare Other

## 2019-05-15 ENCOUNTER — Inpatient Hospital Stay: Payer: Medicare Other | Admitting: Oncology

## 2019-05-15 ENCOUNTER — Telehealth: Payer: Self-pay | Admitting: Cardiology

## 2019-05-15 NOTE — Telephone Encounter (Signed)
Patient calling the office for samples of medication:   1.  What medication and dosage are you requesting samples for? rivaroxaban (XARELTO) 20 MG TABS tablet  2.  Are you currently out of this medication? No   

## 2019-05-19 DIAGNOSIS — C61 Malignant neoplasm of prostate: Secondary | ICD-10-CM | POA: Diagnosis not present

## 2019-05-19 DIAGNOSIS — C7951 Secondary malignant neoplasm of bone: Secondary | ICD-10-CM | POA: Diagnosis not present

## 2019-05-19 DIAGNOSIS — Z5111 Encounter for antineoplastic chemotherapy: Secondary | ICD-10-CM | POA: Diagnosis not present

## 2019-05-19 NOTE — Telephone Encounter (Signed)
Spoke to patient Xarelto 20 mg samples left at [employer] office front desk.

## 2019-05-26 ENCOUNTER — Other Ambulatory Visit: Payer: Self-pay | Admitting: Internal Medicine

## 2019-05-28 ENCOUNTER — Telehealth: Payer: Self-pay

## 2019-05-28 NOTE — Telephone Encounter (Signed)
Patient called to cancel lab and MD appt for tomorrow. Scheduling message sent and patient aware that he will receive a call to get appts rescheduled. Patient verbalized understanding and MD made aware.

## 2019-05-29 ENCOUNTER — Inpatient Hospital Stay: Payer: Medicare Other

## 2019-05-29 ENCOUNTER — Inpatient Hospital Stay: Payer: Medicare Other | Admitting: Oncology

## 2019-06-01 ENCOUNTER — Telehealth: Payer: Self-pay | Admitting: Oncology

## 2019-06-01 NOTE — Telephone Encounter (Signed)
Called pt per 9/10 sch message - per pt , he wants to see Dr. Tamala Julian first then he will call us to reschedule appt.

## 2019-06-03 ENCOUNTER — Other Ambulatory Visit: Payer: Self-pay | Admitting: Internal Medicine

## 2019-06-08 ENCOUNTER — Other Ambulatory Visit: Payer: Self-pay | Admitting: Internal Medicine

## 2019-06-15 ENCOUNTER — Ambulatory Visit (INDEPENDENT_AMBULATORY_CARE_PROVIDER_SITE_OTHER): Payer: Medicare Other

## 2019-06-15 ENCOUNTER — Telehealth: Payer: Self-pay | Admitting: Cardiology

## 2019-06-15 ENCOUNTER — Ambulatory Visit: Payer: Medicare Other | Admitting: Cardiology

## 2019-06-15 DIAGNOSIS — Z23 Encounter for immunization: Secondary | ICD-10-CM | POA: Diagnosis not present

## 2019-06-15 NOTE — Telephone Encounter (Signed)
New Message  ° ° ° °Patient calling the office for samples of medication: ° ° °1.  What medication and dosage are you requesting samples for?   Xarelto 20 mg ° °2.  Are you currently out of this medication?  Yes  ° ° ° °

## 2019-06-15 NOTE — Telephone Encounter (Signed)
Medication samples have been provided to the patient.  Drug name: Xarelto  Qty: 20 mg  LOT: [id number]  Exp.Date: 05/22  Samples left at front desk for patient pick-up. Patient notified.

## 2019-06-16 DIAGNOSIS — C7951 Secondary malignant neoplasm of bone: Secondary | ICD-10-CM | POA: Diagnosis not present

## 2019-06-16 DIAGNOSIS — C61 Malignant neoplasm of prostate: Secondary | ICD-10-CM | POA: Diagnosis not present

## 2019-06-24 ENCOUNTER — Telehealth: Payer: Self-pay | Admitting: Cardiology

## 2019-06-24 NOTE — Telephone Encounter (Signed)
Spoke to patient Xarelto 20 mg samples left at [employer] office front desk.

## 2019-06-24 NOTE — Telephone Encounter (Signed)
Patient calling the office for samples of medication: ° ° °1.  What medication and dosage are you requesting samples for? Xarelto ° °2.  Are you currently out of this medication? Just a few ° °

## 2019-06-25 ENCOUNTER — Telehealth: Payer: Self-pay | Admitting: Internal Medicine

## 2019-06-25 MED ORDER — TAMSULOSIN HCL 0.4 MG PO CAPS: 0.4000 mg | ORAL_CAPSULE | Freq: Every day | ORAL | 3 refills | Status: DC

## 2019-06-25 NOTE — Telephone Encounter (Signed)
Requesting refill of flomax, completely out today.  States that he requested from [facility] a few days ago but we never received the request.   Please advise, thanks.

## 2019-06-26 ENCOUNTER — Ambulatory Visit (INDEPENDENT_AMBULATORY_CARE_PROVIDER_SITE_OTHER): Payer: Medicare Other | Admitting: Internal Medicine

## 2019-06-26 ENCOUNTER — Other Ambulatory Visit: Payer: Self-pay

## 2019-06-26 ENCOUNTER — Ambulatory Visit (INDEPENDENT_AMBULATORY_CARE_PROVIDER_SITE_OTHER)
Admission: RE | Admit: 2019-06-26 | Discharge: 2019-06-26 | Disposition: A | Discharge status: Home / Self Care | Payer: Medicare Other | Source: Ambulatory Visit | Attending: Internal Medicine | Admitting: Internal Medicine

## 2019-06-26 ENCOUNTER — Encounter: Payer: Self-pay | Admitting: Internal Medicine

## 2019-06-26 DIAGNOSIS — L97412 Non-pressure chronic ulcer of right heel and midfoot with fat layer exposed: Secondary | ICD-10-CM

## 2019-06-26 DIAGNOSIS — E11621 Type 2 diabetes mellitus with foot ulcer: Secondary | ICD-10-CM | POA: Diagnosis not present

## 2019-06-26 DIAGNOSIS — E08621 Diabetes mellitus due to underlying condition with foot ulcer: Secondary | ICD-10-CM | POA: Diagnosis not present

## 2019-06-26 DIAGNOSIS — L97519 Non-pressure chronic ulcer of other part of right foot with unspecified severity: Secondary | ICD-10-CM | POA: Diagnosis not present

## 2019-06-26 MED ORDER — LEVOFLOXACIN 500 MG PO TABS: 500.0000 mg | ORAL_TABLET | Freq: Every day | ORAL | 2 refills | Status: DC

## 2019-06-26 NOTE — Progress Notes (Signed)
Subjective:    Patient ID: Adrian Singh, male    DOB: Jun 04, 1934, 85 y.o.   MRN: [id number]  HPI Here due to problem with right foot  Has noticed a "hole" in it Recurrent callous formation Just won't heal Same place as before  No fever  He just keeps it clean and covers with bandaid Gets some blood on bandaid--no major drainage  Current Outpatient Medications on File Prior to Visit  Medication Sig Dispense Refill  . atorvastatin (LIPITOR) 40 MG tablet Take 1/2 (one-half) tablet by mouth once daily 45 tablet 3  . BAYER MICROLET LANCETS lancets Use to test blood sugar once daily dx: E11.40 100 each 1  . Blood Glucose Monitoring Suppl (CONTOUR NEXT EZ MONITOR) w/Device KIT USE TO TEST BLOOD SUGAR ONCE DAILY. Dx Code E11.40 1 kit 0  . Cholecalciferol (VITAMIN D3) 2000 units TABS Take 2,000 Units by mouth daily.    Marland Kitchen docusate sodium (COLACE) 250 MG capsule Take 250 mg by mouth daily.    . furosemide (LASIX) 40 MG tablet Take 1 tablet (40 mg total) by mouth daily as needed. 15 tablet 0  . glimepiride (AMARYL) 4 MG tablet Take 1 tablet by mouth twice daily 180 tablet 3  . glucose blood (CONTOUR NEXT TEST) test strip Check blood sugar once daily and as directed.Dx Code E11.40 100 each 1  . metFORMIN (GLUCOPHAGE) 500 MG tablet TAKE 1 TABLET BY MOUTH THREE TIMES DAILY 270 tablet 3  . metoprolol succinate (TOPROL-XL) 25 MG 24 hr tablet Take 1 tablet (25 mg total) by mouth daily. 90 tablet 3  . Multiple Vitamin (MULTIVITAMIN WITH MINERALS) TABS tablet Take 1 tablet by mouth 2 (two) times a week. ONE-A-DAY FOR MEN    . Naproxen Sodium (ALEVE) 220 MG CAPS Take 220 mg by mouth daily.     . nitroGLYCERIN (NITROSTAT) 0.4 MG SL tablet Place 1 tablet (0.4 mg total) under the tongue every 5 (five) minutes as needed for chest pain. 25 tablet 0  . Pantothenic Acid 500 MG TABS Take by mouth.    . potassium chloride (K-DUR) 10 MEQ tablet Take 1 tablet (10 mEq total) by mouth daily. 30 tablet 3  .  prochlorperazine (COMPAZINE) 10 MG tablet Take 1 tablet (10 mg total) by mouth every 6 (six) hours as needed for nausea or vomiting. 30 tablet 0  . ramipril (ALTACE) 10 MG capsule Take 1 capsule (10 mg total) by mouth daily. 90 capsule 3  . rivaroxaban (XARELTO) 20 MG TABS tablet Take 1 tablet (20 mg total) by mouth daily with supper. 90 tablet 3  . tamsulosin (FLOMAX) 0.4 MG CAPS capsule Take 1 capsule (0.4 mg total) by mouth daily. 90 capsule 3  . traMADol (ULTRAM) 50 MG tablet TAKE 1 TABLET BY MOUTH THREE TIMES DAILY AS NEEDED 30 tablet 0   No current facility-administered medications on file prior to visit.     Allergies  Allergen Reactions  . Soma [Carisoprodol] Other (See Comments)    "did a number on me"  . Doxazosin Other (See Comments)    dizziness    Past Medical History:  Diagnosis Date  . Arthritis    "legs, back" (10/06/2015)  . Atrial fibrillation ([place])    x1  . CAD (coronary artery disease)    nonobstructive  . Cancer ([place])    skin cancer on ear (froze it off) and back (cut it off)  . Dysrhythmia   . Esophageal reflux    hx of  .  History of kidney stones   . Hx of heart artery stent   . Hypertrophy of prostate without urinary obstruction and other lower urinary tract symptoms (LUTS)   . Osteoarthrosis, unspecified whether generalized or localized, unspecified site   . Prostate cancer ([place])   . Skin cancer   . Stroke [facility]) 03/2014   "had a series of mini strokes; maybe 4"; denies residual on 10/06/2015  . Thrombocytopenia ([place])   . Type II diabetes mellitus ([place])    type 2  . Unspecified essential hypertension     Past Surgical History:  Procedure Laterality Date  . BACK SURGERY     2001  . CARDIAC CATHETERIZATION  12/2007  . CARDIAC CATHETERIZATION N/A 09/22/2015   Procedure: Left Heart Cath and Coronary Angiography;  Surgeon: Peter M Martinique, MD;  Location: [place] [facility];  Service: Cardiovascular;  Laterality: N/A;  . CARDIAC CATHETERIZATION N/A  10/06/2015   Procedure: Coronary Stent Intervention;  Surgeon: Peter M Martinique, MD;  Location: [place] [facility];  Service: Cardiovascular;  Laterality: N/A;  . CARDIAC CATHETERIZATION N/A 02/29/2016   Procedure: Left Heart Cath and Coronary Angiography;  Surgeon: Jettie Booze, MD;  Location: [place] [facility];  Service: Cardiovascular;  Laterality: N/A;  . COLONOSCOPY W/ BIOPSIES AND POLYPECTOMY    . CORONARY STENT PLACEMENT  10/06/2015   [name]  with DES  . EAR CYST EXCISION N/A 04/06/2015   Procedure: EXCISION OF SCALP CYST;  Surgeon: Donnie Mesa, MD;  Location: [place];  Service: General;  Laterality: N/A;  . ESOPHAGOGASTRODUODENOSCOPY (EGD) WITH ESOPHAGEAL DILATION  2001   with dilation  . LUMBAR DISC SURGERY  09/26/1999   "cleaned out arthritis and bone spurs"  . SHOULDER ARTHROSCOPY W/ ROTATOR CUFF REPAIR Left 2005  . THULIUM LASER TURP (TRANSURETHRAL RESECTION OF PROSTATE) N/A 01/21/2017   Procedure: Marcelino Duster LASER TURP (TRANSURETHRAL RESECTION OF PROSTATE) CAUTERIZATION OF BLADDER LESION;  Surgeon: Franchot Gallo, MD;  Location: [facility];  Service: Urology;  Laterality: N/A;    Family History  Problem Relation Age of Onset  . Stroke Father   . Peripheral vascular disease Father        amputation  . Heart failure Mother        CHF  . Coronary artery disease Mother   . Heart attack Mother 19       Multiple  . COPD Brother   . Heart disease Brother   . Cancer Brother        Bone  . Lung cancer Sister     Social History   Socioeconomic History  . Marital status: Married    Spouse name: Not on file  . Number of children: 4  . Years of education: Not on file  . Highest education level: Not on file  Occupational History  . Occupation: Radiographer, 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[employer]    Employer: [employer]  . Financial resource strain: Not on file  . Food insecurity    Worry: Not on file    Inability: Not on file  . Transportation needs    Medical: Not on file     Non-medical: Not on file  Tobacco Use  . Smoking status: Former Smoker    Years: 3.00    Types: Cigarettes  . Smokeless tobacco: Never Used  . Tobacco comment: " Quit smoking by age 44; was a someday smoker "  Substance and Sexual Activity  . Alcohol use: No  . Drug use: No  .  Sexual activity: Never  Lifestyle  . Physical activity    Days per week: Not on file    Minutes per session: Not on file  . Stress: Not on file  Relationships  . Social Herbalist on phone: Not on file    Gets together: Not on file    Attends religious service: Not on file    Active member of club or organization: Not on file    Attends meetings of clubs or organizations: Not on file    Relationship status: Not on file  . Intimate partner violence    Fear of current or ex partner: Not on file    Emotionally abused: Not on file    Physically abused: Not on file    Forced sexual activity: Not on file  Other Topics Concern  . Not on file  Social History Narrative   No living will   Plans wife and then children to make health care decisions for him if unable   Would request at least attempts at resuscitation but no prolonged life support   Doesn't think he would want tube feeds if cognitively unaware   Review of Systems  Does not feel sick No N/V Eating okay Sugars are good---- highest 166---usually under 120     Objective:   Physical Exam  Skin:  Now with ~7-97m ulcer with white base (not bone) Tender around it without striking inflammation           Assessment & Plan:

## 2019-06-26 NOTE — Addendum Note (Signed)
Addended by: Viviana Simpler I on: 06/26/2019 01:08 PM   Modules accepted: Level of Service

## 2019-06-26 NOTE — Assessment & Plan Note (Addendum)
Will check x-ray to see if bony involvement There is mild demineralization of the bone under the ulcer---certainly could indicate osteomyelitis Will (reluctantly) start levaquin for broadest coverage (consider adding augmentin)---likely multibacterial but will check culture first Referral to ID Likely should get 6 weeks of Rx regardless

## 2019-06-29 LAB — WOUND CULTURE
MICRO NUMBER:: 974050
SPECIMEN QUALITY:: ADEQUATE

## 2019-07-02 ENCOUNTER — Other Ambulatory Visit: Payer: Self-pay

## 2019-07-02 ENCOUNTER — Ambulatory Visit: Payer: Medicare Other | Admitting: Infectious Diseases

## 2019-07-02 ENCOUNTER — Encounter: Payer: Self-pay | Admitting: Infectious Diseases

## 2019-07-02 VITALS — BP 152/76 | HR 69 | Wt 170.0 lb

## 2019-07-02 DIAGNOSIS — M86671 Other chronic osteomyelitis, right ankle and foot: Secondary | ICD-10-CM | POA: Diagnosis not present

## 2019-07-02 DIAGNOSIS — N182 Chronic kidney disease, stage 2 (mild): Secondary | ICD-10-CM | POA: Diagnosis not present

## 2019-07-02 DIAGNOSIS — C61 Malignant neoplasm of prostate: Secondary | ICD-10-CM

## 2019-07-02 MED ORDER — LEVOFLOXACIN 500 MG PO TABS: 500.0000 mg | ORAL_TABLET | Freq: Every day | ORAL | 0 refills | Status: DC

## 2019-07-02 NOTE — Patient Instructions (Addendum)
Continue levaquin until 08/07/2019 (refill sent to your pharmacy for 30 day supply). Obtain MRI at [facility] to evaluate RT foot. Return to clinic in 5 weeks. Do not climb ladders or frequently climb stairs while on levaquin.

## 2019-07-02 NOTE — Progress Notes (Signed)
Subjective:    Patient ID: Adrian Singh, male    DOB: Feb 12, 1934, 85 y.o.   MRN: [id number]  HPI The patient is an 85 year old white male diabetic with metastatic prostate cancer presenting today for an evaluation of a chronic right foot wound.  Per the patient's report, he has had ongoing issues with a right foot wound since the age of 27 when he stepped on a broken glass bottle many years prior to his diagnosis of diabetes.  Unfortunately, the patient is a rather poor informant as to the chronicity of his wound but does state that he is seeing a podiatrist for many years, especially since retiring approximately 20 years ago that has remained to his distal right foot.  While he rarely has drainage from this wound, he has been unable to heal the wound consistently and develops persistent pain to his foot with any degree of excessive activity.  He has recently been followed by podiatrist locally.  On June 26, 2019, he had a wound culture obtained from his ulcer that showed only polymicrobial skin flora.  He was empirically started on oral Levaquin 500 mg p.o. daily and had plain films obtained to his right foot that showed a possible cortical demineralization of the head of the third metatarsal possibly representing early osteomyelitis.  He denies any repetitive trauma to his right foot and denies any systemic fevers or chills.  He has tolerated Levaquin well thus far.  Of note, the patient has recently been treated for metastatic prostate cancer but stopped taking hormonal treatment and low-dose steroids in early July secondary to intolerances of the chemotherapy.  He reports an approximate 50 pound weight loss since the beginning of the year, which has begun to improve following cessation of chemotherapy.  A recent nuclear medicine bone scan performed in May 2020 showed metastatic lesions to T7, his left iliac bone, and possibly T9.  He does admit to chronic back and hip pain.  Briefly, the option of  placing a PICC line and proceeding with IV antibiotics was briefly discussed with the patient.  He was not in favor of this method of treatment.   Past Medical History:  Diagnosis Date   Arthritis    "legs, back" (10/06/2015)   Atrial fibrillation ([facility])    x1   CAD (coronary artery disease)    nonobstructive   Cancer ([facility])    skin cancer on ear (froze it off) and back (cut it off)   Dysrhythmia    Esophageal reflux    hx of   History of kidney stones    Hx of heart artery stent    Hypertrophy of prostate without urinary obstruction and other lower urinary tract symptoms (LUTS)    Osteoarthrosis, unspecified whether generalized or localized, unspecified site    Prostate cancer ([place])    Skin cancer    Stroke ([place]) 03/2014   "had a series of mini strokes; maybe 4"; denies residual on 10/06/2015   Thrombocytopenia ([place])    Type II diabetes mellitus ([place])    type 2   Unspecified essential hypertension     Past Surgical History:  Procedure Laterality Date   BACK SURGERY     2001   CARDIAC CATHETERIZATION  12/2007   CARDIAC CATHETERIZATION N/A 09/22/2015   Procedure: Left Heart Cath and Coronary Angiography;  Surgeon: Peter M Martinique, MD;  Location: [place] [facility];  Service: Cardiovascular;  Laterality: N/A;   CARDIAC CATHETERIZATION N/A 10/06/2015   Procedure: Coronary  Stent Intervention;  Surgeon: Peter M Martinique, MD;  Location: [place] [facility];  Service: Cardiovascular;  Laterality: N/A;   CARDIAC CATHETERIZATION N/A 02/29/2016   Procedure: Left Heart Cath and Coronary Angiography;  Surgeon: Jettie Booze, MD;  Location: [place] [facility];  Service: Cardiovascular;  Laterality: N/A;   COLONOSCOPY W/ BIOPSIES AND POLYPECTOMY     CORONARY STENT PLACEMENT  10/06/2015   [name]  with DES   EAR CYST EXCISION N/A 04/06/2015   Procedure: EXCISION OF SCALP CYST;  Surgeon: Donnie Mesa, MD;  Location: [place];  Service: General;  Laterality: N/A;    ESOPHAGOGASTRODUODENOSCOPY (EGD) WITH ESOPHAGEAL DILATION  2001   with dilation   LUMBAR DISC SURGERY  09/26/1999   "cleaned out arthritis and bone spurs"   SHOULDER ARTHROSCOPY W/ ROTATOR CUFF REPAIR Left 2005   THULIUM LASER TURP (TRANSURETHRAL RESECTION OF PROSTATE) N/A 01/21/2017   Procedure: Marcelino Duster LASER TURP (TRANSURETHRAL RESECTION OF PROSTATE) CAUTERIZATION OF BLADDER LESION;  Surgeon: Franchot Gallo, MD;  [facility];  Service: Urology;  Laterality: N/A;     Family History  Problem Relation Age of Onset   Stroke Father    Peripheral vascular disease Father        amputation   Heart failure Mother        CHF   Coronary artery disease Mother    Heart attack Mother 49       Multiple   COPD Brother    Heart disease Brother    Cancer Brother        Bone   Lung cancer Sister      Social History   Tobacco Use   Smoking status: Former Smoker    Years: 3.00    Types: Cigarettes   Smokeless tobacco: Never Used   Tobacco comment: " Quit smoking by age 39; was a someday smoker "  Substance Use Topics   Alcohol use: No   Drug use: No      reports never being sexually active.   Outpatient Medications Prior to Visit  Medication Sig Dispense Refill   atorvastatin (LIPITOR) 40 MG tablet Take 1/2 (one-half) tablet by mouth once daily 45 tablet 3   BAYER MICROLET LANCETS lancets Use to test blood sugar once daily dx: E11.40 100 each 1   Blood Glucose Monitoring Suppl (CONTOUR NEXT EZ MONITOR) w/Device KIT USE TO TEST BLOOD SUGAR ONCE DAILY. Dx Code E11.40 1 kit 0   Cholecalciferol (VITAMIN D3) 2000 units TABS Take 2,000 Units by mouth daily.     docusate sodium (COLACE) 250 MG capsule Take 250 mg by mouth daily.     furosemide (LASIX) 40 MG tablet Take 1 tablet (40 mg total) by mouth daily as needed. 15 tablet 0   glimepiride (AMARYL) 4 MG tablet Take 1 tablet by mouth twice daily 180 tablet 3   glucose blood (CONTOUR NEXT TEST) test strip Check  blood sugar once daily and as directed.Dx Code E11.40 100 each 1   metFORMIN (GLUCOPHAGE) 500 MG tablet TAKE 1 TABLET BY MOUTH THREE TIMES DAILY 270 tablet 3   metoprolol succinate (TOPROL-XL) 25 MG 24 hr tablet Take 1 tablet (25 mg total) by mouth daily. 90 tablet 3   Multiple Vitamin (MULTIVITAMIN WITH MINERALS) TABS tablet Take 1 tablet by mouth 2 (two) times a week. ONE-A-DAY FOR MEN     Naproxen Sodium (ALEVE) 220 MG CAPS Take 220 mg by mouth daily.      nitroGLYCERIN (NITROSTAT) 0.4 MG  SL tablet Place 1 tablet (0.4 mg total) under the tongue every 5 (five) minutes as needed for chest pain. 25 tablet 0   Pantothenic Acid 500 MG TABS Take by mouth.     potassium chloride (K-DUR) 10 MEQ tablet Take 1 tablet (10 mEq total) by mouth daily. 30 tablet 3   prochlorperazine (COMPAZINE) 10 MG tablet Take 1 tablet (10 mg total) by mouth every 6 (six) hours as needed for nausea or vomiting. 30 tablet 0   ramipril (ALTACE) 10 MG capsule Take 1 capsule (10 mg total) by mouth daily. 90 capsule 3   rivaroxaban (XARELTO) 20 MG TABS tablet Take 1 tablet (20 mg total) by mouth daily with supper. 90 tablet 3   tamsulosin (FLOMAX) 0.4 MG CAPS capsule Take 1 capsule (0.4 mg total) by mouth daily. 90 capsule 3   traMADol (ULTRAM) 50 MG tablet TAKE 1 TABLET BY MOUTH THREE TIMES DAILY AS NEEDED 30 tablet 0   levofloxacin (LEVAQUIN) 500 MG tablet Take 1 tablet (500 mg total) by mouth daily. 14 tablet 2   No facility-administered medications prior to visit.      Allergies  Allergen Reactions   Soma [Carisoprodol] Other (See Comments)    "did a number on me"   Doxazosin Other (See Comments)    dizziness      Review of Systems  Constitutional: Positive for fatigue. Negative for chills and fever.  HENT: Negative for congestion, hearing loss, rhinorrhea and sinus pressure.   Eyes: Negative for photophobia, pain, redness and visual disturbance.  Respiratory: Negative for apnea, cough, shortness  of breath and wheezing.   Cardiovascular: Negative for chest pain and palpitations.  Gastrointestinal: Negative for abdominal pain, constipation, diarrhea, nausea and vomiting.  Endocrine: Negative for cold intolerance, heat intolerance, polydipsia and polyuria.  Genitourinary: Positive for decreased urine volume. Negative for dysuria, frequency, hematuria and testicular pain.  Musculoskeletal: Positive for arthralgias and back pain. Negative for myalgias and neck pain.       LT hip, T-spine  Skin: Positive for wound. Negative for pallor and rash.       RT foot, chronic  Allergic/Immunologic: Negative for immunocompromised state.  Neurological: Negative for dizziness, seizures, syncope, speech difficulty and light-headedness.  Hematological: Does not bruise/bleed easily.  Psychiatric/Behavioral: Negative for agitation and hallucinations. The patient is not nervous/anxious.        Objective:     Vitals:   07/02/19 1513  BP: (!) 152/76  Pulse: 69     Physical Exam Gen: pleasant, chronically ill-appearing, NAD, A&Ox 3 Head: NCAT, no temporal wasting evident EENT: PERRL, EOMI, MMM, adequate dentition Neck: supple, no JVD CV: NRRR, no murmurs evident Pulm: CTA bilaterally, no wheeze or retractions Abd: soft, NTND, +BS Extrems:  1+ non-pitting LE edema, 1+ pulses MSK: dime sized plantar ulcer on distal RT foot w/o active drainage and minimal surrounding erythema Skin: no rashes, adequate skin turgor Neuro: CN II-XII grossly intact, no focal neurologic deficits appreciated, gait was slowed (pt ambulating with cane for stabilization), A&Ox 3   Labs: Lab Results  Component Value Date   WBC 5.7 03/13/2019   HGB 10.2 (L) 03/13/2019   HCT 32.0 (L) 03/13/2019   MCV 84.9 03/13/2019   PLT 137 (L) 03/13/2019   Lab Results  Component Value Date   NA 140 03/13/2019   K 3.9 03/13/2019   CL 105 03/13/2019   CO2 25 03/13/2019   GLUCOSE 333 (H) 03/13/2019   BUN 21 03/13/2019    CREATININE  1.45 (H) 03/13/2019   CALCIUM 9.3 03/13/2019   PHOS 2.6 11/02/2016   No results found for: CRP     Assessment & Plan:  The patient is an 85 year old white male diabetic with metastatic prostate cancer on recent chemotherapy presenting for an evaluation of a chronic right foot wound.  Chronic right foot wound -while the patient's wound culture and plain film imaging are somewhat inconclusive, I agree with concern that the patient does have an osteomyelitis to his third metatarsal head.  For this reason, I will continue the patient's oral Levaquin for an anticipated 6-week course at 500 mg daily given his relative renal insufficiency.  We will repeat the patient's CBC with differential, BMP and CRP today to establish a baseline.  Fortunately, he has tolerated oral Levaquin well for the much of the past week.  He was advised not to climb stairs or ladders in excess as prolonged Levaquin therapy does increase his risk for tendon rupture, particularly to his Achilles.  The patient expressed understanding of these instructions.  Importantly, his wound culture did not appear to grow Staph aureus, Pseudomonas, or beta-hemolytic strep.  The remainder of common skin flora should adequately be treated by oral Levaquin, so we will forego PICC line placement and parenteral antibiotics.  Patient will follow up in 4 weeks time for reassessment.  We will also check an MRI without contrast to further evaluate/confirm osteomyelitis to his right foot and to exclude a more discreet abscess that would require intervention.  Diabetes mellitus -the patient's glycemic control has improved in recent months now that he is no longer taking chronic prednisone as part of his chemotherapeutic regimen for his prostate cancer.  Over the last 2 weeks the patient states that his highest blood sugar was 160.  I would aim for the patient to have daily blood sugars no higher than 150 to optimize his infectious  outcome.  Prostate cancer -the patient's most recent PSA was 1.2 in the spring of this year.  Unfortunately, he was unable to tolerate recent hormonal treatment and now has been off treatment in total for the last 3 months.  I assume that the patient's course is palliative at this time.  At his advanced age, I am uncertain if his prostate cancer will behave aggressively despite his known metastatic bony lesions.

## 2019-07-03 LAB — CBC WITH DIFFERENTIAL/PLATELET
Absolute Monocytes: 742 cells/uL (ref 200–950)
Basophils Absolute: 38 cells/uL (ref 0–200)
Basophils Relative: 0.6 %
Eosinophils Absolute: 90 cells/uL (ref 15–500)
Eosinophils Relative: 1.4 %
HCT: 31.4 % — ABNORMAL LOW (ref 38.5–50.0)
Hemoglobin: 10.3 g/dL — ABNORMAL LOW (ref 13.2–17.1)
Lymphs Abs: 1670 cells/uL (ref 850–3900)
MCH: 27.5 pg (ref 27.0–33.0)
MCHC: 32.8 g/dL (ref 32.0–36.0)
MCV: 83.7 fL (ref 80.0–100.0)
MPV: 12.8 fL — ABNORMAL HIGH (ref 7.5–12.5)
Monocytes Relative: 11.6 %
Neutro Abs: 3859 cells/uL (ref 1500–7800)
Neutrophils Relative %: 60.3 %
Platelets: 163 10*3/uL (ref 140–400)
RBC: 3.75 10*6/uL — ABNORMAL LOW (ref 4.20–5.80)
RDW: 13.9 % (ref 11.0–15.0)
Total Lymphocyte: 26.1 %
WBC: 6.4 10*3/uL (ref 3.8–10.8)

## 2019-07-03 LAB — BASIC METABOLIC PANEL
BUN/Creatinine Ratio: 17 (calc) (ref 6–22)
BUN: 19 mg/dL (ref 7–25)
CO2: 27 mmol/L (ref 20–32)
Calcium: 9.3 mg/dL (ref 8.6–10.3)
Chloride: 109 mmol/L (ref 98–110)
Creat: 1.12 mg/dL — ABNORMAL HIGH (ref 0.70–1.11)
Glucose, Bld: 72 mg/dL (ref 65–99)
Potassium: 4.5 mmol/L (ref 3.5–5.3)
Sodium: 141 mmol/L (ref 135–146)

## 2019-07-03 LAB — C-REACTIVE PROTEIN: CRP: 28.4 mg/L — ABNORMAL HIGH (ref <8.0)

## 2019-07-08 ENCOUNTER — Other Ambulatory Visit: Payer: Self-pay | Admitting: Internal Medicine

## 2019-07-08 NOTE — Telephone Encounter (Signed)
Spoke to pt. He will check with Dr Eulogio Ditch.

## 2019-07-08 NOTE — Telephone Encounter (Signed)
Dr Eulogio Ditch has been filling this for him. Find out why they are requesting it from me now

## 2019-07-08 NOTE — Telephone Encounter (Signed)
Patient called pharmacy for refill, they advised they have sent it over 3 times to [place] and have no received the refill.  Patient is needing his  TraMADol.  He has 1 left  [place]

## 2019-07-08 NOTE — Telephone Encounter (Signed)
We apologize but we have not received a refill request for him for this.  Last filled 08-07-18 #30 Last OV 06-26-19 Next OV 10-26-19

## 2019-07-16 NOTE — Progress Notes (Signed)
Adrian Singh Date of Birth: 05-22-1934   History of Present Illness: Adrian Singh is seen today for follow up CAD.   He has a history of orthostatic dizziness. He was admitted in July 9357 with an embolic stroke felt to be due to paroxysmal Afib. He was anticoagulated with Eliquis. The patient developed tachycardia and HA on Eliquis that resolved when he was switched to Xarelto. Previous orthostatic dizziness improved with stopping diuretic and alpha blockers he was taking for enlarged prostate.   He was seen in the office on 08/10/2015 with shortness of breath with exertion. He had outpatient stress test done on 08/25/2015 which showed EF mildly decreased 45-54%, no significant ST elevation or depression during stress portion, overall considered low risk study, there was prior myocardial infarction with peri-infarct ischemia. The peri-infarct ischemia was felt to be new compared to the previous stress test, therefore outpatient cardiac catheterization was arranged. He underwent planned study on 09/22/2015 which showed 30% mid LAD lesion, 99% mid to distal left circumflex lesion, 50% OM 3 lesion. Due to poor support from right radial approach and concern for renal function, it was planned for staged PCI at a later time. He was admitted to [facility] on 10/06/15 for staged PCI/DES to mid LCx lesion that wa 99% stenosed. There was residual 30% mid LAD stenosis to be treated medically.  He was admitted in June 2017 with chest pain. Cardiac cath showed no new disease and prior stent was patent. Later when seen as an outpatient he developed orthostatic dizziness. maxzide was stopped and metoprolol was reduced with improvement.  When seen in 2017 he reported marked worsening of orthostatic dizziness.  He was back on Maxide and also taking Flomax for obstructive urinary symptoms. We stopped both of these medications and referred him to urology. He underwent laser TURP on 0/1/77 without complication. He has been  diagnosed with metastatic prostate CA to bone and is now receiving hormonal therapy with Zytiga. He did not tolerate this well and he had significant weight loss with this. Now getting hormonal therapy.   He states he really feels great from a cardiac standpoint. No chest pain or dyspnea. Energy level is better. He is able to function much better with activity. He did lose a lot of weight with cancer treatment and notes improvement in sugar.       Current Outpatient Medications on File Prior to Visit  Medication Sig Dispense Refill  . atorvastatin (LIPITOR) 40 MG tablet Take 1/2 (one-half) tablet by mouth once daily 45 tablet 3  . BAYER MICROLET LANCETS lancets Use to test blood sugar once daily dx: E11.40 100 each 1  . Blood Glucose Monitoring Suppl (CONTOUR NEXT EZ MONITOR) w/Device KIT USE TO TEST BLOOD SUGAR ONCE DAILY. Dx Code E11.40 1 kit 0  . Cholecalciferol (VITAMIN D3) 2000 units TABS Take 2,000 Units by mouth daily.    Marland Kitchen docusate sodium (COLACE) 250 MG capsule Take 250 mg by mouth daily.    . furosemide (LASIX) 40 MG tablet Take 1 tablet (40 mg total) by mouth daily as needed. 15 tablet 0  . glimepiride (AMARYL) 4 MG tablet Take 1 tablet by mouth twice daily 180 tablet 3  . glucose blood (CONTOUR NEXT TEST) test strip Check blood sugar once daily and as directed.Dx Code E11.40 100 each 1  . levofloxacin (LEVAQUIN) 500 MG tablet Take 1 tablet (500 mg total) by mouth daily. 30 tablet 0  . metFORMIN (GLUCOPHAGE) 500 MG tablet TAKE 1  TABLET BY MOUTH THREE TIMES DAILY 270 tablet 3  . metoprolol succinate (TOPROL-XL) 25 MG 24 hr tablet Take 1 tablet (25 mg total) by mouth daily. 90 tablet 3  . Multiple Vitamin (MULTIVITAMIN WITH MINERALS) TABS tablet Take 1 tablet by mouth 2 (two) times a week. ONE-A-DAY FOR MEN    . Naproxen Sodium (ALEVE) 220 MG CAPS Take 220 mg by mouth daily.     . nitroGLYCERIN (NITROSTAT) 0.4 MG SL tablet Place 1 tablet (0.4 mg total) under the tongue every 5 (five)  minutes as needed for chest pain. 25 tablet 0  . Pantothenic Acid 500 MG TABS Take by mouth.    . potassium chloride (K-DUR) 10 MEQ tablet Take 1 tablet (10 mEq total) by mouth daily. 30 tablet 3  . prochlorperazine (COMPAZINE) 10 MG tablet Take 1 tablet (10 mg total) by mouth every 6 (six) hours as needed for nausea or vomiting. 30 tablet 0  . ramipril (ALTACE) 10 MG capsule Take 1 capsule (10 mg total) by mouth daily. 90 capsule 3  . rivaroxaban (XARELTO) 20 MG TABS tablet Take 1 tablet (20 mg total) by mouth daily with supper. 90 tablet 3  . tamsulosin (FLOMAX) 0.4 MG CAPS capsule Take 1 capsule (0.4 mg total) by mouth daily. 90 capsule 3  . traMADol (ULTRAM) 50 MG tablet TAKE 1 TABLET BY MOUTH THREE TIMES DAILY AS NEEDED 30 tablet 0   No current facility-administered medications on file prior to visit.     Allergies  Allergen Reactions  . Soma [Carisoprodol] Other (See Comments)    "did a number on me"  . Doxazosin Other (See Comments)    dizziness    Past Medical History:  Diagnosis Date  . Arthritis    "legs, back" (10/06/2015)  . Atrial fibrillation ([place])    x1  . CAD (coronary artery disease)    nonobstructive  . Cancer ([place])    skin cancer on ear (froze it off) and back (cut it off)  . Dysrhythmia   . Esophageal reflux    hx of  . History of kidney stones   . Hx of heart artery stent   . Hypertrophy of prostate without urinary obstruction and other lower urinary tract symptoms (LUTS)   . Osteoarthrosis, unspecified whether generalized or localized, unspecified site   . Prostate cancer ([place])   . Skin cancer   . Stroke [facility]) 03/2014   "had a series of mini strokes; maybe 4"; denies residual on 10/06/2015  . Thrombocytopenia ([place])   . Type II diabetes mellitus ([place])    type 2  . Unspecified essential hypertension     Past Surgical History:  Procedure Laterality Date  . BACK SURGERY     2001  . CARDIAC CATHETERIZATION  12/2007  . CARDIAC CATHETERIZATION N/A  09/22/2015   Procedure: Left Heart Cath and Coronary Angiography;  Surgeon: Reveca Desmarais M [place], MD;  Location: [place] [facility];  Service: Cardiovascular;  Laterality: N/A;  . CARDIAC CATHETERIZATION N/A 10/06/2015   Procedure: Coronary Stent Intervention;  Surgeon: Danaisha Celli M [place], MD;  Location: [facility];  Service: Cardiovascular;  Laterality: N/A;  . CARDIAC CATHETERIZATION N/A 02/29/2016   Procedure: Left Heart Cath and Coronary Angiography;  Surgeon: Jettie Booze, MD;  Location: [place] [facility];  Service: Cardiovascular;  Laterality: N/A;  . COLONOSCOPY W/ BIOPSIES AND POLYPECTOMY    . CORONARY STENT PLACEMENT  10/06/2015   [name]  with DES  . EAR CYST EXCISION  N/A 04/06/2015   Procedure: EXCISION OF SCALP CYST;  Surgeon: Donnie Mesa, MD;  Location: [facility];  Service: General;  Laterality: N/A;  . ESOPHAGOGASTRODUODENOSCOPY (EGD) WITH ESOPHAGEAL DILATION  2001   with dilation  . LUMBAR DISC SURGERY  09/26/1999   "cleaned out arthritis and bone spurs"  . SHOULDER ARTHROSCOPY W/ ROTATOR CUFF REPAIR Left 2005  . THULIUM LASER TURP (TRANSURETHRAL RESECTION OF PROSTATE) N/A 01/21/2017   Procedure: Marcelino Duster LASER TURP (TRANSURETHRAL RESECTION OF PROSTATE) CAUTERIZATION OF BLADDER LESION;  Surgeon: Franchot Gallo, MD;  Location: [facility];  Service: Urology;  Laterality: N/A;    Social History   Tobacco Use  Smoking Status Former Smoker  . Years: 3.00  . Types: Cigarettes  Smokeless Tobacco Never Used  Tobacco Comment   " Quit smoking by age 43; was a someday smoker "    Social History   Substance and Sexual Activity  Alcohol Use No    Family History  Problem Relation Age of Onset  . Stroke Father   . Peripheral vascular disease Father        amputation  . Heart failure Mother        CHF  . Coronary artery disease Mother   . Heart attack Mother 78       Multiple  . COPD Brother   . Heart disease Brother   . Cancer Brother        Bone  . Lung cancer Sister      Review of Systems: The review of systems is as noted in HPI.  All other systems were reviewed and are negative.  Physical Exam: BP (!) 150/70 (BP Location: Right Arm)   Pulse 72   Ht _0  (1.778 m)   Wt 178 lb 6.4 oz (80.9 kg)   SpO2 100%   BMI 25.60 kg/m   GENERAL:  Well appearing  WM in NAD HEENT:  PERRL, EOMI, sclera are clear. Oropharynx is clear. NECK:  No jugular venous distention, carotid upstroke brisk and symmetric, no bruits, no thyromegaly or adenopathy LUNGS:  Clear to auscultation bilaterally CHEST:  Tender to palpatation in the left chest and lateral ribs. HEART:  RRR,  PMI not displaced or sustained,S1 and S2 within normal limits, no S3, no S4: no clicks, no rubs, no murmurs ABD:  Soft, nontender. BS +, no masses or bruits. No hepatomegaly, no splenomegaly EXT:  2 + pulses throughout, no edema, no cyanosis no clubbing SKIN:  Warm and dry.  No rashes NEURO:  Alert and oriented x 3. Cranial nerves II through XII intact. PSYCH:  Cognitively intact    LABORATORY DATA: Lab Results  Component Value Date   WBC 6.4 07/02/2019   HGB 10.3 (L) 07/02/2019   HCT 31.4 (L) 07/02/2019   PLT 163 07/02/2019   GLUCOSE 72 07/02/2019   CHOL 83 11/14/2017   TRIG 199.0 (H) 11/14/2017   HDL 32.50 (L) 11/14/2017   LDLDIRECT 77.0 03/28/2015   LDLCALC 11 11/14/2017   ALT 22 03/13/2019   AST 25 03/13/2019   NA 141 07/02/2019   K 4.5 07/02/2019   CL 109 07/02/2019   CREATININE 1.12 (H) 07/02/2019   BUN 19 07/02/2019   CO2 27 07/02/2019   TSH 3.33 08/26/2013   PSA 158.00 (H) 01/14/2017   INR 1.45 02/29/2016   HGBA1C 7.7 (A) 04/06/2019   MICROALBUR 17.2 (H) 05/23/2012   Ecg today  Shows NSR rate 72. Old infero-posterior infarct. Low voltage. I have personally reviewed and interpreted  this study.  Assessment / Plan: 1. Orthostatic hypotension. . It is exacerbated by medication- especially diuretics and Flomax. Resolved after stopping these meds.   2. CAD s/p DES of mid  LCx in January 2017. He is asymptomatic. Repeat cardiac cath in June 2017 showed continued patency. Off antiplatelet therapy. Continue Xarelto.  3. Stage IV prostate CA with bony metastases. Followed by oncology and urology.   4. CKD stage 3.   5. Paroxysmal Afib. Continue metoprolol and Xarelto. Asymptomatic.  6. History of CVA.  7. DM per  Dr Silvio Pate.  8. HTN mildly elevated today. Will monitor  It has been awhile since lipids last checked. Will add this to his next blood draw.    I will follow up in 6 months.

## 2019-07-17 DIAGNOSIS — C61 Malignant neoplasm of prostate: Secondary | ICD-10-CM | POA: Diagnosis not present

## 2019-07-17 DIAGNOSIS — C7951 Secondary malignant neoplasm of bone: Secondary | ICD-10-CM | POA: Diagnosis not present

## 2019-07-18 ENCOUNTER — Ambulatory Visit: Payer: Medicare Other

## 2019-07-20 ENCOUNTER — Other Ambulatory Visit: Payer: Self-pay

## 2019-07-20 ENCOUNTER — Encounter: Payer: Self-pay | Admitting: Cardiology

## 2019-07-20 ENCOUNTER — Ambulatory Visit: Payer: Medicare Other | Admitting: Cardiology

## 2019-07-20 VITALS — BP 150/70 | HR 72 | Ht 70.0 in | Wt 178.4 lb

## 2019-07-20 DIAGNOSIS — I480 Paroxysmal atrial fibrillation: Secondary | ICD-10-CM | POA: Diagnosis not present

## 2019-07-20 DIAGNOSIS — Z9861 Coronary angioplasty status: Secondary | ICD-10-CM

## 2019-07-20 DIAGNOSIS — I2510 Atherosclerotic heart disease of native coronary artery without angina pectoris: Secondary | ICD-10-CM

## 2019-07-20 DIAGNOSIS — I951 Orthostatic hypotension: Secondary | ICD-10-CM

## 2019-07-20 DIAGNOSIS — I10 Essential (primary) hypertension: Secondary | ICD-10-CM

## 2019-07-20 MED ORDER — METFORMIN HCL 500 MG PO TABS: 500.0000 mg | ORAL_TABLET | Freq: Every day | ORAL | 3 refills | Status: DC

## 2019-07-28 ENCOUNTER — Ambulatory Visit
Admission: RE | Admit: 2019-07-28 | Discharge: 2019-07-28 | Disposition: A | Discharge status: Home / Self Care | Payer: Medicare Other | Source: Ambulatory Visit | Attending: Infectious Diseases | Admitting: Infectious Diseases

## 2019-07-28 ENCOUNTER — Other Ambulatory Visit: Payer: Self-pay

## 2019-07-28 DIAGNOSIS — M86671 Other chronic osteomyelitis, right ankle and foot: Secondary | ICD-10-CM | POA: Insufficient documentation

## 2019-07-28 DIAGNOSIS — C61 Malignant neoplasm of prostate: Secondary | ICD-10-CM | POA: Insufficient documentation

## 2019-07-28 DIAGNOSIS — L97519 Non-pressure chronic ulcer of other part of right foot with unspecified severity: Secondary | ICD-10-CM | POA: Diagnosis not present

## 2019-07-28 DIAGNOSIS — E11621 Type 2 diabetes mellitus with foot ulcer: Secondary | ICD-10-CM | POA: Diagnosis not present

## 2019-07-28 DIAGNOSIS — N182 Chronic kidney disease, stage 2 (mild): Secondary | ICD-10-CM | POA: Diagnosis not present

## 2019-08-07 ENCOUNTER — Telehealth: Payer: Self-pay | Admitting: Cardiology

## 2019-08-07 MED ORDER — RIVAROXABAN 20 MG PO TABS: 20.0000 mg | ORAL_TABLET | Freq: Every day | ORAL | 3 refills | Status: DC

## 2019-08-07 NOTE — Telephone Encounter (Signed)
NO SAMPLES AVAILABLE  Try back next week

## 2019-08-07 NOTE — Telephone Encounter (Signed)
Patient calling the office for samples of medication:   1.  What medication and dosage are you requesting samples for? rivaroxaban (XARELTO) 20 MG TABS tablet  2.  Are you currently out of this medication?  No, but close

## 2019-08-07 NOTE — Telephone Encounter (Signed)
Pt notified Refill sent to requested pharmacy

## 2019-08-10 ENCOUNTER — Telehealth: Payer: Self-pay

## 2019-08-10 ENCOUNTER — Telehealth: Payer: Self-pay | Admitting: Cardiology

## 2019-08-10 NOTE — Telephone Encounter (Signed)
I need to be able to see his foot wound, so it would be best if he came in for an in-person visit.

## 2019-08-10 NOTE — Telephone Encounter (Signed)
Patient called to request an evisit for his foloow up appointment tomorrow with Dr. Prince Rome. Patient would like to review MRI results and advise if he should continue antibiotics.  Routing to provider for evisit confirmation. Eugenia Mcalpine

## 2019-08-10 NOTE — Telephone Encounter (Signed)
Spoke with pt, aware no samples available at this time. 

## 2019-08-10 NOTE — Telephone Encounter (Signed)
Patient calling the office for samples of medication:   1.  What medication and dosage are you requesting samples for? rivaroxaban (XARELTO) 20 MG TABS tablet  2.  Are you currently out of this medication? Patient is out of medication.    

## 2019-08-10 NOTE — Telephone Encounter (Signed)
Patient made aware. Confirmed he will be in-person tomorrow.  Thanks

## 2019-08-11 ENCOUNTER — Encounter: Payer: Self-pay | Admitting: Infectious Diseases

## 2019-08-11 ENCOUNTER — Ambulatory Visit: Payer: Medicare Other | Admitting: Infectious Diseases

## 2019-08-11 ENCOUNTER — Other Ambulatory Visit: Payer: Self-pay

## 2019-08-11 VITALS — BP 165/79 | HR 76 | Temp 97.8°F | Wt 182.0 lb

## 2019-08-11 DIAGNOSIS — N182 Chronic kidney disease, stage 2 (mild): Secondary | ICD-10-CM | POA: Diagnosis not present

## 2019-08-11 DIAGNOSIS — M86671 Other chronic osteomyelitis, right ankle and foot: Secondary | ICD-10-CM | POA: Diagnosis not present

## 2019-08-11 NOTE — Progress Notes (Signed)
Subjective:    Patient ID: Adrian Singh, male    DOB: 04-16-1934, 85 y.o.   MRN: [id number]  HPI The patient is an 85 year old white male diabetic with metastatic prostate cancer presenting today for a routine return visit for a chronic right foot wound. He was last seen in our clinic on July 02, 2019. Per the patient's report, he has had ongoing issues with a right foot wound since the age of 66 when he stepped on a broken glass bottle many years prior to his diagnosis of diabetes.  Unfortunately, the patient is a rather poor informant as to the chronicity of his distal RT foot wound but does state that he is seeing a podiatrist for many years, especially since retiring approximately 20 years ago.  While he rarely has drainage from this wound, he has been unable to heal the wound consistently and develops persistent pain to his foot with any degree of excessive activity. On June 26, 2019, he had a wound culture obtained from his ulcer that showed only polymicrobial skin flora.  He was empirically started on oral Levaquin 500 mg p.o. daily. While plain films obtained to his right foot showed a possible cortical demineralization of the head of the third metatarsal worrisome for early osteomyelitis, his recent MRI on July 28, 2019 showed a small skin ulceration on the plantar aspect of the third MTP but no evidence of osteomyelitis.  Significantly, his MRI showed no evidence of any foreign body to his foot despite his prior concerns.  He denies any repetitive trauma to his right foot and denies any systemic fevers or chills.  At his last visit, I continued his oral Levaquin which he has tolerated well and will complete in 2 more days.  Of note, the patient has recently been treated for metastatic prostate cancer but stopped taking hormonal treatment and low-dose steroids in early July secondary to intolerances of the chemotherapy.  He reports an approximate 50 pound weight loss since the beginning of  the year, which has begun to improve following cessation of chemotherapy.  A recent nuclear medicine bone scan performed in May 2020 showed metastatic lesions to T7, his left iliac bone, and possibly T9.  He does admit to chronic back and hip pain.  His CRP taken at his last visit was 28.4 while he had a normal white blood cell count of 6400.  The drainage from his foot has now stopped.  His wound has failed to heal, however as the patient states that he has been continuing to ambulate on his foot at least 4 to 8 hours a day.  His glycemic control has improved over the last 2 months with improved dialogue and medication adjustment with his primary care physician and him stopping steroids as part of his chemotherapeutic regimen.   Past Medical History:  Diagnosis Date   Arthritis    "legs, back" (10/06/2015)   Atrial fibrillation (HCC)    x1   CAD (coronary artery disease)    nonobstructive   Cancer (HCC)    skin cancer on ear (froze it off) and back (cut it off)   Dysrhythmia    Esophageal reflux    hx of   History of kidney stones    Hx of heart artery stent    Hypertrophy of prostate without urinary obstruction and other lower urinary tract symptoms (LUTS)    Osteoarthrosis, unspecified whether generalized or localized, unspecified site    Prostate cancer ([place])    Skin  cancer    Stroke ([place]) 03/2014   "had a series of mini strokes; maybe 4"; denies residual on 10/06/2015   Thrombocytopenia ([place])    Type II diabetes mellitus ([place])    type 2   Unspecified essential hypertension     Past Surgical History:  Procedure Laterality Date   BACK SURGERY     2001   CARDIAC CATHETERIZATION  12/2007   CARDIAC CATHETERIZATION N/A 09/22/2015   Procedure: Left Heart Cath and Coronary Angiography;  Surgeon: Peter M Martinique, MD;  Location: [facility];  Service: Cardiovascular;  Laterality: N/A;   CARDIAC CATHETERIZATION N/A 10/06/2015   Procedure: Coronary Stent  Intervention;  Surgeon: Peter M Martinique, MD;  Location: [facility];  Service: Cardiovascular;  Laterality: N/A;   CARDIAC CATHETERIZATION N/A 02/29/2016   Procedure: Left Heart Cath and Coronary Angiography;  Surgeon: Jettie Booze, MD;  Location: [facility];  Service: Cardiovascular;  Laterality: N/A;   COLONOSCOPY W/ BIOPSIES AND POLYPECTOMY     CORONARY STENT PLACEMENT  10/06/2015   [name]  with DES   EAR CYST EXCISION N/A 04/06/2015   Procedure: EXCISION OF SCALP CYST;  Surgeon: Donnie Mesa, MD;  Location: [facility];  Service: General;  Laterality: N/A;   ESOPHAGOGASTRODUODENOSCOPY (EGD) WITH ESOPHAGEAL DILATION  2001   with dilation   LUMBAR DISC SURGERY  09/26/1999   "cleaned out arthritis and bone spurs"   SHOULDER ARTHROSCOPY W/ ROTATOR CUFF REPAIR Left 2005   THULIUM LASER TURP (TRANSURETHRAL RESECTION OF PROSTATE) N/A 01/21/2017   Procedure: Marcelino Duster LASER TURP (TRANSURETHRAL RESECTION OF PROSTATE) CAUTERIZATION OF BLADDER LESION;  Surgeon: Franchot Gallo, MD;  Location: [facility];  Service: Urology;  Laterality: N/A;     Family History  Problem Relation Age of Onset   Stroke Father    Peripheral vascular disease Father        amputation   Heart failure Mother        CHF   Coronary artery disease Mother    Heart attack Mother 8       Multiple   COPD Brother    Heart disease Brother    Cancer Brother        Bone   Lung cancer Sister      Social History   Tobacco Use   Smoking status: Former Smoker    Years: 3.00    Types: Cigarettes   Smokeless tobacco: Never Used   Tobacco comment: " Quit smoking by age 50; was a someday smoker "  Substance Use Topics   Alcohol use: No   Drug use: No      reports never being sexually active.   Outpatient Medications Prior to Visit  Medication Sig Dispense Refill   atorvastatin (LIPITOR) 40 MG tablet Take 1/2 (one-half) tablet by mouth once daily 45 tablet 3   BAYER MICROLET LANCETS  lancets Use to test blood sugar once daily dx: E11.40 100 each 1   Blood Glucose Monitoring Suppl (CONTOUR NEXT EZ MONITOR) w/Device KIT USE TO TEST BLOOD SUGAR ONCE DAILY. Dx Code E11.40 1 kit 0   Cholecalciferol (VITAMIN D3) 2000 units TABS Take 2,000 Units by mouth daily.     docusate sodium (COLACE) 250 MG capsule Take 250 mg by mouth daily.     furosemide (LASIX) 40 MG tablet Take 1 tablet (40 mg total) by mouth daily as needed. 15 tablet 0   glimepiride (AMARYL) 4 MG tablet Take 1 tablet by mouth twice daily  180 tablet 3   glucose blood (CONTOUR NEXT TEST) test strip Check blood sugar once daily and as directed.Dx Code E11.40 100 each 1   levofloxacin (LEVAQUIN) 500 MG tablet Take 1 tablet (500 mg total) by mouth daily. 30 tablet 0   metFORMIN (GLUCOPHAGE) 500 MG tablet Take 1 tablet (500 mg total) by mouth daily with breakfast. 270 tablet 3   metoprolol succinate (TOPROL-XL) 25 MG 24 hr tablet Take 1 tablet (25 mg total) by mouth daily. 90 tablet 3   Multiple Vitamin (MULTIVITAMIN WITH MINERALS) TABS tablet Take 1 tablet by mouth 2 (two) times a week. ONE-A-DAY FOR MEN     Naproxen Sodium (ALEVE) 220 MG CAPS Take 220 mg by mouth daily.      nitroGLYCERIN (NITROSTAT) 0.4 MG SL tablet Place 1 tablet (0.4 mg total) under the tongue every 5 (five) minutes as needed for chest pain. 25 tablet 0   Pantothenic Acid 500 MG TABS Take by mouth.     potassium chloride (K-DUR) 10 MEQ tablet Take 1 tablet (10 mEq total) by mouth daily. 30 tablet 3   prochlorperazine (COMPAZINE) 10 MG tablet Take 1 tablet (10 mg total) by mouth every 6 (six) hours as needed for nausea or vomiting. 30 tablet 0   ramipril (ALTACE) 10 MG capsule Take 1 capsule (10 mg total) by mouth daily. 90 capsule 3   rivaroxaban (XARELTO) 20 MG TABS tablet Take 1 tablet (20 mg total) by mouth daily with supper. 90 tablet 3   tamsulosin (FLOMAX) 0.4 MG CAPS capsule Take 1 capsule (0.4 mg total) by mouth daily. 90 capsule  3   traMADol (ULTRAM) 50 MG tablet TAKE 1 TABLET BY MOUTH THREE TIMES DAILY AS NEEDED 30 tablet 0   No facility-administered medications prior to visit.      Allergies  Allergen Reactions   Soma [Carisoprodol] Other (See Comments)    "did a number on me"   Doxazosin Other (See Comments)    dizziness      Review of Systems  Constitutional: Positive for fatigue. Negative for chills and fever.  HENT: Negative for congestion, hearing loss, rhinorrhea and sinus pressure.   Eyes: Negative for photophobia, pain, redness and visual disturbance.  Respiratory: Negative for apnea, cough, shortness of breath and wheezing.   Cardiovascular: Negative for chest pain and palpitations.  Gastrointestinal: Negative for abdominal pain, constipation, diarrhea, nausea and vomiting.  Endocrine: Negative for cold intolerance, heat intolerance, polydipsia and polyuria.  Genitourinary: Positive for decreased urine volume. Negative for dysuria, frequency, hematuria and testicular pain.  Musculoskeletal: Positive for arthralgias and back pain. Negative for myalgias and neck pain.       LT hip, T-spine  Skin: Positive for wound. Negative for pallor and rash.       RT foot, chronic  Allergic/Immunologic: Negative for immunocompromised state.  Neurological: Negative for dizziness, seizures, syncope, speech difficulty and light-headedness.  Hematological: Does not bruise/bleed easily.  Psychiatric/Behavioral: Negative for agitation and hallucinations. The patient is not nervous/anxious.        Objective:     Vitals:   08/11/19 1102  BP: (!) 165/79  Pulse: 76  Temp: 97.8 F (36.6 C)     Physical Exam Gen: pleasant, chronically ill/elderly, NAD, A&Ox 3 Head: NCAT, no temporal wasting evident EENT: PERRL, EOMI, MMM, adequate dentition Neck: supple, no JVD CV: NRRR, no murmurs evident Pulm: CTA bilaterally, no wheeze or retractions Abd: soft, NTND, +BS Extrems: trace LE edema, 2+ pulses,  pinpoint callous/ulcer to  plantar surface of RT forefoot w/o erythema or drainage expressible from wound Skin: chronic venous stasis to both LEs, adequate skin turgor Neuro: CN II-XII grossly intact, no focal neurologic deficits appreciated, gait was staggering but improved with use of his cane, A&Ox 3   Labs: Lab Results  Component Value Date   WBC 6.4 07/02/2019   HGB 10.3 (L) 07/02/2019   HCT 31.4 (L) 07/02/2019   MCV 83.7 07/02/2019   PLT 163 07/02/2019   Lab Results  Component Value Date   NA 141 07/02/2019   K 4.5 07/02/2019   CL 109 07/02/2019   CO2 27 07/02/2019   GLUCOSE 72 07/02/2019   BUN 19 07/02/2019   CREATININE 1.12 (H) 07/02/2019   CALCIUM 9.3 07/02/2019   PHOS 2.6 11/02/2016   Lab Results  Component Value Date   CRP 28.4 (H) 07/02/2019       Assessment & Plan:  The patient is an 85 year old white male diabetic with metastatic prostate cancer on recent chemotherapy presenting for an evaluation of a chronic right foot wound.  Chronic right foot wound -  Fortunately, he has tolerated oral Levaquin well for the much of the past several weeks.  While he refrained from climbing stairs and ladders in excess during his prolonged Levaquin therapy to lower his risk for tendon rupture, he continues to ambulate excessively averaging 4-8 hours a day.  As his CRP was elevated at his last visit, will repeat today, recognizing that this elevation may be in part due to his untreated prostate cancer.  Fortunately, his MRI showed no evidence of either a foreign body or osteomyelitis, so his Levaquin treatment may end at today's visit.  A pathogen was never explicitly recognized but he has had an adequate response thus far.  I stressed the importance of the patient ambulating only 1 to 2 hours/day to minimize shear forces/friction to his wound which has likely resulted in his delayed healing.  His glycemic control has also improved however in the last 2 months.  At this time the  patient may follow-up with his wound care center where hopefully he can obtain either an offloading shoe or a wheelchair to avoid excessive ambulation which appears to be contributing the most to his poorly healing wound.  Diabetes mellitus -the patient's glycemic control has improved in recent months now that he is no longer taking chronic prednisone as part of his chemotherapeutic regimen for his prostate cancer.  Since his last visit with me, he states that his blood sugars have been running from 1 10-1 60 on average.  I would aim for the patient to have daily blood sugars no higher than 150 to optimize his infectious outcome.  Prostate cancer -the patient's most recent PSA was 1.2 in the spring of this year.  Unfortunately, he was unable to tolerate recent hormonal treatment and now has been off treatment in total for the last 3 months.  I assume that the patient's course is palliative at this time.  At his advanced age, I am uncertain if his prostate cancer will behave aggressively despite his known metastatic bony lesions.

## 2019-08-11 NOTE — Patient Instructions (Signed)
Limit weight bearing activities (walking, etc.) to no more than 1-2 hours a day until wound heals completely. Follow up with Dr. Silvio Pate at wound care center and ask about off-loading shoe to help ulcer heal.

## 2019-08-12 LAB — C-REACTIVE PROTEIN: CRP: 17.1 mg/L — ABNORMAL HIGH (ref <8.0)

## 2019-08-17 DIAGNOSIS — C61 Malignant neoplasm of prostate: Secondary | ICD-10-CM | POA: Diagnosis not present

## 2019-08-17 DIAGNOSIS — C7951 Secondary malignant neoplasm of bone: Secondary | ICD-10-CM | POA: Diagnosis not present

## 2019-08-18 DIAGNOSIS — C61 Malignant neoplasm of prostate: Secondary | ICD-10-CM | POA: Diagnosis not present

## 2019-08-21 ENCOUNTER — Other Ambulatory Visit: Payer: Self-pay | Admitting: Urology

## 2019-08-21 ENCOUNTER — Other Ambulatory Visit (HOSPITAL_COMMUNITY): Payer: Self-pay | Admitting: Urology

## 2019-08-21 DIAGNOSIS — C61 Malignant neoplasm of prostate: Secondary | ICD-10-CM

## 2019-08-25 DIAGNOSIS — C61 Malignant neoplasm of prostate: Secondary | ICD-10-CM | POA: Diagnosis not present

## 2019-09-04 ENCOUNTER — Telehealth: Payer: Self-pay | Admitting: Cardiology

## 2019-09-04 NOTE — Telephone Encounter (Signed)
Patient calling the office for samples of medication:   1.  What medication and dosage are you requesting samples for? rivaroxaban (XARELTO) 20 MG TABS tablet  2.  Are you currently out of this medication? none

## 2019-09-04 NOTE — Telephone Encounter (Signed)
Patient has been notified directly and voiced understanding. Samples placed upfront.

## 2019-09-14 ENCOUNTER — Other Ambulatory Visit: Payer: Self-pay

## 2019-09-14 ENCOUNTER — Encounter (HOSPITAL_COMMUNITY)
Admission: RE | Admit: 2019-09-14 | Discharge: 2019-09-14 | Disposition: A | Discharge status: Home / Self Care | Payer: Medicare Other | Source: Ambulatory Visit | Attending: Urology | Admitting: Urology

## 2019-09-14 DIAGNOSIS — C61 Malignant neoplasm of prostate: Secondary | ICD-10-CM | POA: Diagnosis not present

## 2019-09-14 DIAGNOSIS — C7951 Secondary malignant neoplasm of bone: Secondary | ICD-10-CM | POA: Diagnosis not present

## 2019-09-14 MED ORDER — TECHNETIUM TC 99M MEDRONATE IV KIT
20.5000 | PACK | Freq: Once | INTRAVENOUS | Status: AC
  Administered 2019-09-14: 20.5 via INTRAVENOUS

## 2019-09-15 DIAGNOSIS — C7951 Secondary malignant neoplasm of bone: Secondary | ICD-10-CM | POA: Diagnosis not present

## 2019-09-15 DIAGNOSIS — Z5111 Encounter for antineoplastic chemotherapy: Secondary | ICD-10-CM | POA: Diagnosis not present

## 2019-09-15 DIAGNOSIS — C61 Malignant neoplasm of prostate: Secondary | ICD-10-CM | POA: Diagnosis not present

## 2019-09-17 ENCOUNTER — Telehealth: Payer: Self-pay | Admitting: Cardiology

## 2019-09-17 NOTE — Telephone Encounter (Signed)
Patient calling the office for samples of medication:   1.  What medication and dosage are you requesting samples for?   rivaroxaban (XARELTO) 20 MG TABS tablet   2.  Are you currently out of this medication? No, has enough medication to last until 09/23/19

## 2019-09-17 NOTE — Telephone Encounter (Signed)
Spoke to patient advised office out of Xarelto samples.

## 2019-09-22 NOTE — Telephone Encounter (Signed)
Pt aware no samples available at this time ./cy

## 2019-09-22 NOTE — Telephone Encounter (Signed)
Patient calling the office for samples of medication:   1.  What medication and dosage are you requesting samples for? rivaroxaban (XARELTO) 20 MG TABS tablet  2.  Are you currently out of this medication? No, has enough medication to last until 09/23/19

## 2019-10-15 DIAGNOSIS — R3912 Poor urinary stream: Secondary | ICD-10-CM | POA: Diagnosis not present

## 2019-10-15 DIAGNOSIS — C7951 Secondary malignant neoplasm of bone: Secondary | ICD-10-CM | POA: Diagnosis not present

## 2019-10-15 DIAGNOSIS — C61 Malignant neoplasm of prostate: Secondary | ICD-10-CM | POA: Diagnosis not present

## 2019-10-20 ENCOUNTER — Telehealth: Payer: Self-pay | Admitting: Cardiology

## 2019-10-20 NOTE — Telephone Encounter (Signed)
  Patient calling the office for samples of medication:   1.  What medication and dosage are you requesting samples for? xareto   2.  Are you currently out of this medication? Yes

## 2019-10-20 NOTE — Telephone Encounter (Signed)
Called patient, advised him we had samples and will place them up front for him to pick up.   Patient verbalized understanding- his daughter will come by to get them for him.

## 2019-10-26 ENCOUNTER — Other Ambulatory Visit: Payer: Self-pay

## 2019-10-26 ENCOUNTER — Encounter: Payer: Self-pay | Admitting: Internal Medicine

## 2019-10-26 ENCOUNTER — Ambulatory Visit (INDEPENDENT_AMBULATORY_CARE_PROVIDER_SITE_OTHER): Payer: Medicare Other | Admitting: Internal Medicine

## 2019-10-26 DIAGNOSIS — L97402 Non-pressure chronic ulcer of unspecified heel and midfoot with fat layer exposed: Secondary | ICD-10-CM

## 2019-10-26 DIAGNOSIS — E1140 Type 2 diabetes mellitus with diabetic neuropathy, unspecified: Secondary | ICD-10-CM

## 2019-10-26 DIAGNOSIS — C7951 Secondary malignant neoplasm of bone: Secondary | ICD-10-CM

## 2019-10-26 DIAGNOSIS — I480 Paroxysmal atrial fibrillation: Secondary | ICD-10-CM

## 2019-10-26 DIAGNOSIS — C61 Malignant neoplasm of prostate: Secondary | ICD-10-CM | POA: Diagnosis not present

## 2019-10-26 DIAGNOSIS — IMO0002 Reserved for concepts with insufficient information to code with codable children: Secondary | ICD-10-CM

## 2019-10-26 DIAGNOSIS — E08621 Diabetes mellitus due to underlying condition with foot ulcer: Secondary | ICD-10-CM

## 2019-10-26 DIAGNOSIS — E1165 Type 2 diabetes mellitus with hyperglycemia: Secondary | ICD-10-CM

## 2019-10-26 NOTE — Assessment & Plan Note (Signed)
Had 6 weeks of antibiotic MRI negative for osteo Just won't heel Will just monitor--he will come in if any change

## 2019-10-26 NOTE — Assessment & Plan Note (Signed)
Widespread metastatic disease on bone scan Probably starting Xtandi soon

## 2019-10-26 NOTE — Assessment & Plan Note (Signed)
No symptoms and heart is regular now Is on the xarelto

## 2019-10-26 NOTE — Progress Notes (Signed)
Subjective:    Patient ID: Adrian Singh, male    DOB: March 19, 1934, 86 y.o.   MRN: [id number]  HPI Here for follow up of his persistent diabetic foot ulcer This visit occurred during the SARS-CoV-2 public health emergency.  Safety protocols were in place, including screening questions prior to the visit, additional usage of staff PPE, and extensive cleaning of exam room while observing appropriate contact time as indicated for disinfecting solutions.   Ulcer just won't go away Starts to heal but then will open and bleed Did have layer over it while on the antibiotic MRI didn't show bone infection Bone scan just showed known wide spread bone mets from the prostate cancer Will start new prostate medication ----Gillermina Phy?  Diabetes has been better---- running in 140's mostly Up as high as 170 No recent hypoglycemic spells  No palpitations No chest pain Just has brief dizziness upon standing---"I want to keep moving to the right" Breathing is good  Current Outpatient Medications on File Prior to Visit  Medication Sig Dispense Refill  . atorvastatin (LIPITOR) 40 MG tablet Take 1/2 (one-half) tablet by mouth once daily 45 tablet 3  . BAYER MICROLET LANCETS lancets Use to test blood sugar once daily dx: E11.40 100 each 1  . Blood Glucose Monitoring Suppl (CONTOUR NEXT EZ MONITOR) w/Device KIT USE TO TEST BLOOD SUGAR ONCE DAILY. Dx Code E11.40 1 kit 0  . Cholecalciferol (VITAMIN D3) 2000 units TABS Take 2,000 Units by mouth daily.    Marland Kitchen glimepiride (AMARYL) 4 MG tablet Take 1 tablet by mouth twice daily 180 tablet 3  . glucose blood (CONTOUR NEXT TEST) test strip Check blood sugar once daily and as directed.Dx Code E11.40 100 each 1  . metFORMIN (GLUCOPHAGE) 500 MG tablet Take 1 tablet (500 mg total) by mouth daily with breakfast. 270 tablet 3  . metoprolol succinate (TOPROL-XL) 25 MG 24 hr tablet Take 1 tablet (25 mg total) by mouth daily. 90 tablet 3  . Multiple Vitamin (MULTIVITAMIN WITH  MINERALS) TABS tablet Take 1 tablet by mouth 2 (two) times a week. ONE-A-DAY FOR MEN    . nitroGLYCERIN (NITROSTAT) 0.4 MG SL tablet Place 1 tablet (0.4 mg total) under the tongue every 5 (five) minutes as needed for chest pain. 25 tablet 0  . ramipril (ALTACE) 10 MG capsule Take 1 capsule (10 mg total) by mouth daily. 90 capsule 3  . rivaroxaban (XARELTO) 20 MG TABS tablet Take 1 tablet (20 mg total) by mouth daily with supper. 90 tablet 3  . tamsulosin (FLOMAX) 0.4 MG CAPS capsule Take 1 capsule (0.4 mg total) by mouth daily. 90 capsule 3  . traMADol (ULTRAM) 50 MG tablet TAKE 1 TABLET BY MOUTH THREE TIMES DAILY AS NEEDED 30 tablet 0  . docusate sodium (COLACE) 250 MG capsule Take 250 mg by mouth daily.    . furosemide (LASIX) 40 MG tablet Take 1 tablet (40 mg total) by mouth daily as needed. 15 tablet 0  . Pantothenic Acid 500 MG TABS Take by mouth.     No current facility-administered medications on file prior to visit.    Allergies  Allergen Reactions  . Soma [Carisoprodol] Other (See Comments)    "did a number on me"  . Doxazosin Other (See Comments)    dizziness    Past Medical History:  Diagnosis Date  . Arthritis    "legs, back" (10/06/2015)  . Atrial fibrillation ([place])    x1  . CAD (coronary artery disease)  nonobstructive  . Cancer ([place])    skin cancer on ear (froze it off) and back (cut it off)  . Dysrhythmia   . Esophageal reflux    hx of  . History of kidney stones   . Hx of heart artery stent   . Hypertrophy of prostate without urinary obstruction and other lower urinary tract symptoms (LUTS)   . Osteoarthrosis, unspecified whether generalized or localized, unspecified site   . Prostate cancer ([place])   . Skin cancer   . Stroke [facility]) 03/2014   "had a series of mini strokes; maybe 4"; denies residual on 10/06/2015  . Thrombocytopenia ([place])   . Type II diabetes mellitus ([place])    type 2  . Unspecified essential hypertension     Past Surgical History:   Procedure Laterality Date  . BACK SURGERY     2001  . CARDIAC CATHETERIZATION  12/2007  . CARDIAC CATHETERIZATION N/A 09/22/2015   Procedure: Left Heart Cath and Coronary Angiography;  Surgeon: Peter M Martinique, MD;  Location: [facility];  Service: Cardiovascular;  Laterality: N/A;  . CARDIAC CATHETERIZATION N/A 10/06/2015   Procedure: Coronary Stent Intervention;  Surgeon: Peter M Martinique, MD;  Location: [facility];  Service: Cardiovascular;  Laterality: N/A;  . CARDIAC CATHETERIZATION N/A 02/29/2016   Procedure: Left Heart Cath and Coronary Angiography;  Surgeon: Jettie Booze, MD;  Location: [facility];  Service: Cardiovascular;  Laterality: N/A;  . COLONOSCOPY W/ BIOPSIES AND POLYPECTOMY    . CORONARY STENT PLACEMENT  10/06/2015   [name]  with DES  . EAR CYST EXCISION N/A 04/06/2015   Procedure: EXCISION OF SCALP CYST;  Surgeon: Donnie Mesa, MD;  Location: [facility];  Service: General;  Laterality: N/A;  . ESOPHAGOGASTRODUODENOSCOPY (EGD) WITH ESOPHAGEAL DILATION  2001   with dilation  . LUMBAR DISC SURGERY  09/26/1999   "cleaned out arthritis and bone spurs"  . SHOULDER ARTHROSCOPY W/ ROTATOR CUFF REPAIR Left 2005  . THULIUM LASER TURP (TRANSURETHRAL RESECTION OF PROSTATE) N/A 01/21/2017   Procedure: Marcelino Duster LASER TURP (TRANSURETHRAL RESECTION OF PROSTATE) CAUTERIZATION OF BLADDER LESION;  Surgeon: Franchot Gallo, MD;  Location[facility];  Service: Urology;  Laterality: N/A;    Family History  Problem Relation Age of Onset  . Stroke Father   . Peripheral vascular disease Father        amputation  . Heart failure Mother        CHF  . Coronary artery disease Mother   . Heart attack Mother 33       Multiple  . COPD Brother   . Heart disease Brother   . Cancer Brother        Bone  . Lung cancer Sister     Social History   Socioeconomic History  . Marital status: Married    Spouse name: Not on file  . Number of children: 4  . Years of education: Not on  file  . Highest education level: Not on file  Occupational History  . Occupation: Radiographer, 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[employer]    Employer: RETIRED  Tobacco Use  . Smoking status: Former Smoker    Years: 3.00    Types: Cigarettes  . Smokeless tobacco: Never Used  . Tobacco comment: " Quit smoking by age 47; was a someday smoker "  Substance and Sexual Activity  . Alcohol use: No  . Drug use: No  . Sexual activity: Never  Other Topics Concern  . Not on  file  Social History Narrative   No living will   Plans wife and then children to make health care decisions for him if unable   Would request at least attempts at resuscitation but no prolonged life support   Doesn't think he would want tube feeds if cognitively unaware   Social Determinants of Health   Financial Resource Strain:   . Difficulty of Paying Living Expenses: Not on file  Food Insecurity:   . Worried About Charity fundraiser in the Last Year: Not on file  . Ran Out of Food in the Last Year: Not on file  Transportation Needs:   . Lack of Transportation (Medical): Not on file  . Lack of Transportation (Non-Medical): Not on file  Physical Activity:   . Days of Exercise per Week: Not on file  . Minutes of Exercise per Session: Not on file  Stress:   . Feeling of Stress : Not on file  Social Connections:   . Frequency of Communication with Friends and Family: Not on file  . Frequency of Social Gatherings with Friends and Family: Not on file  . Attends Religious Services: Not on file  . Active Member of Clubs or Organizations: Not on file  . Attends Archivist Meetings: Not on file  . Marital Status: Not on file  Intimate Partner Violence:   . Fear of Current or Ex-Partner: Not on file  . Emotionally Abused: Not on file  . Physically Abused: Not on file  . Sexually Abused: Not on file   Review of Systems  Appetite is good--but just doesn't gain any weight Sleeps okay     Objective:   Physical Exam   Constitutional: He appears well-developed. No distress.  Neck: No thyromegaly present.  Cardiovascular: Normal rate, regular rhythm and normal heart sounds. Exam reveals no gallop.  No murmur heard. Feet cool with faint pulse on right, absent on left  Respiratory: Effort normal and breath sounds normal. No respiratory distress. He has no wheezes. He has no rales.  Lymphadenopathy:    He has no cervical adenopathy.  Skin:  Right plantar foot ulcer is dry and no redness Into fat level  Psychiatric: He has a normal mood and affect. His behavior is normal.           Assessment & Plan:

## 2019-10-26 NOTE — Assessment & Plan Note (Signed)
suboptimal control for sure ---especially with the ulcer On glimepiride and metformin Still gets some low sugar reactions at times--so want to hold off on being more aggressive Recheck labs next time

## 2019-11-03 ENCOUNTER — Telehealth: Payer: Self-pay | Admitting: Cardiology

## 2019-11-03 NOTE — Telephone Encounter (Signed)
Pt aware no samples available at this time.

## 2019-11-03 NOTE — Telephone Encounter (Signed)
New Message  Patient calling the office for samples of medication:   1.  What medication and dosage are you requesting samples for?rivaroxaban (XARELTO) 20 MG TABS tablet  2.  Are you currently out of this medication? no

## 2019-11-04 ENCOUNTER — Telehealth: Payer: Self-pay | Admitting: Internal Medicine

## 2019-11-04 NOTE — Chronic Care Management (AMB) (Signed)
Chronic Care Management   Note  11/04/2019 Name: Adrian Singh MRN: [id number] DOB: [birth year]  Adrian Singh is a 86 y.o. year old male who is a primary care patient of [name], MD. I reached out to [name] by phone today in response to a referral sent by Adrian Singh's PCP, [name], MD.   [name] was given information about Chronic Care Management services today including:  1. CCM service includes personalized support from designated clinical staff supervised by his physician, including individualized plan of care and coordination with other care providers 2. 24/7 contact phone numbers for assistance for urgent and routine care needs. 3. Service will only be billed when office clinical staff spend 20 minutes or more in a month to coordinate care. 4. Only one practitioner may furnish and bill the service in a calendar month. 5. The patient may stop CCM services at any time (effective at the end of the month) by phone call to the office staff. 6. The patient will be responsible for cost sharing (co-pay) of up to 20% of the service fee (after annual deductible is met).  Patient agreed to services and verbal consent obtained.   Follow up plan:   [name] [facility] Scheduler

## 2019-11-04 NOTE — Progress Notes (Signed)
  Chronic Care Management   Outreach Note  11/04/2019 Name: Adrian Singh MRN: [id number] DOB: 06/26/1934  Referred by: [name], MD Reason for referral : No chief complaint on file.   An unsuccessful telephone outreach was attempted today. The patient was referred to the pharmacist for assistance with care management and care coordination.   Follow Up Plan:   [name] [facility] Scheduler

## 2019-11-09 ENCOUNTER — Telehealth: Payer: Self-pay

## 2019-11-09 DIAGNOSIS — I10 Essential (primary) hypertension: Secondary | ICD-10-CM

## 2019-11-09 DIAGNOSIS — I480 Paroxysmal atrial fibrillation: Secondary | ICD-10-CM

## 2019-11-09 NOTE — Telephone Encounter (Signed)
I would like to request a referral for Adrian Singh to chronic care management pharmacy services for the following conditions:   Essential hypertension, benign  [I10]  Paroxysmal atrial fibrillation ([place]) [I48.0]  Debbora Dus, PharmD Clinical Pharmacist [facility] at [facility] [phone]

## 2019-11-10 NOTE — Telephone Encounter (Signed)
Referral created.

## 2019-11-12 ENCOUNTER — Ambulatory Visit: Payer: Medicare Other

## 2019-11-12 ENCOUNTER — Other Ambulatory Visit: Payer: Self-pay

## 2019-11-12 DIAGNOSIS — E1122 Type 2 diabetes mellitus with diabetic chronic kidney disease: Secondary | ICD-10-CM

## 2019-11-12 DIAGNOSIS — I480 Paroxysmal atrial fibrillation: Secondary | ICD-10-CM

## 2019-11-12 DIAGNOSIS — N401 Enlarged prostate with lower urinary tract symptoms: Secondary | ICD-10-CM

## 2019-11-12 DIAGNOSIS — I10 Essential (primary) hypertension: Secondary | ICD-10-CM

## 2019-11-12 DIAGNOSIS — Z9861 Coronary angioplasty status: Secondary | ICD-10-CM

## 2019-11-12 DIAGNOSIS — N183 Chronic kidney disease, stage 3 unspecified: Secondary | ICD-10-CM

## 2019-11-12 DIAGNOSIS — E1140 Type 2 diabetes mellitus with diabetic neuropathy, unspecified: Secondary | ICD-10-CM

## 2019-11-12 DIAGNOSIS — IMO0002 Reserved for concepts with insufficient information to code with codable children: Secondary | ICD-10-CM

## 2019-11-12 DIAGNOSIS — K219 Gastro-esophageal reflux disease without esophagitis: Secondary | ICD-10-CM

## 2019-11-12 DIAGNOSIS — I2510 Atherosclerotic heart disease of native coronary artery without angina pectoris: Secondary | ICD-10-CM

## 2019-11-12 DIAGNOSIS — C61 Malignant neoplasm of prostate: Secondary | ICD-10-CM

## 2019-11-12 NOTE — Chronic Care Management (AMB) (Signed)
Chronic Care Management Pharmacy  Name: Adrian Singh  MRN: [id number] DOB: [birth year]  Chief Complaint/ HPI  [name],  86 y.o., male presents for their Initial CCM visit with the clinical pharmacist via telephone.  PCP : [name], MD  Their chronic conditions include: hypertension, AFIB, coronary artery disease, GERD, type 2 diabetes, CKD, osteoarthritis, BPH, prostate cancer, history of CVA  Patient concerns: denies any medication concerns; reports the following medication changes: started a new prostate cancer medication this week - Xtandi 40 mg - so far tolerating well, no stomach issues or drowsiness, lost a lot of weight with previous medication Zytiga, also reports cardio reduced metformin from 3 to 1 tablet daily, chronic osteomyelitis - off all antibiotics for now   Office Visits: 10/26/19: DM - A1c elevated, occasional low blood sugar, holding off on med changes, recheck labs; metastatic prostate cancer   Consult Visit: 08/12/19: chronic osteomyelitis - no med changes 07/20/19: cardiology -  Reduced metformin from TID to once daily, repeat lipid panel   Allergies  Allergen Reactions  . Soma [Carisoprodol] Other (See Comments)    "did a number on me"  . Doxazosin Other (See Comments)    dizziness   Medications: Outpatient Encounter Medications as of 11/12/2019  Medication Sig Note  . atorvastatin (LIPITOR) 40 MG tablet Take 1/2 (one-half) tablet by mouth once daily   . BAYER MICROLET LANCETS lancets Use to test blood sugar once daily dx: E11.40   . Blood Glucose Monitoring Suppl (CONTOUR NEXT EZ MONITOR) w/Device KIT USE TO TEST BLOOD SUGAR ONCE DAILY. Dx Code E11.40   . Cholecalciferol (VITAMIN D3) 2000 units TABS Take 2,000 Units by mouth daily.   Marland Kitchen docusate sodium (COLACE) 250 MG capsule Take 250 mg by mouth daily.   . furosemide (LASIX) 40 MG tablet Take 1 tablet (40 mg total) by mouth daily as needed.   Marland Kitchen glimepiride (AMARYL) 4 MG tablet Take 1  tablet by mouth twice daily   . glucose blood (CONTOUR NEXT TEST) test strip Check blood sugar once daily and as directed.Dx Code E11.40   . metFORMIN (GLUCOPHAGE) 500 MG tablet Take 1 tablet (500 mg total) by mouth daily with breakfast.   . metoprolol succinate (TOPROL-XL) 25 MG 24 hr tablet Take 1 tablet (25 mg total) by mouth daily.   . Multiple Vitamin (MULTIVITAMIN WITH MINERALS) TABS tablet Take 1 tablet by mouth 2 (two) times a week. ONE-A-DAY FOR MEN 01/11/2017: SPORADICALLY  . nitroGLYCERIN (NITROSTAT) 0.4 MG SL tablet Place 1 tablet (0.4 mg total) under the tongue every 5 (five) minutes as needed for chest pain.   . Pantothenic Acid 500 MG TABS Take by mouth.   . ramipril (ALTACE) 10 MG capsule Take 1 capsule (10 mg total) by mouth daily.   . rivaroxaban (XARELTO) 20 MG TABS tablet Take 1 tablet (20 mg total) by mouth daily with supper.   . tamsulosin (FLOMAX) 0.4 MG CAPS capsule Take 1 capsule (0.4 mg total) by mouth daily.   . traMADol (ULTRAM) 50 MG tablet TAKE 1 TABLET BY MOUTH THREE TIMES DAILY AS NEEDED    No facility-administered encounter medications on file as of 11/12/2019.   Current Diagnosis/Assessment: Goals    . Pharmacy Care Plan     Current Barriers:  . Chronic Disease Management support, education, and care coordination needs related to hypertension, AFIB, coronary artery disease, GERD, type 2 diabetes, CKD, osteoarthritis, BPH, prostate cancer, history of CVA  Pharmacist Clinical Goal(s):  .  Maintain blood pressure within goal of less than 140/90 mmHg and prevent low blood pressure. Send prescription for blood pressure monitor to pharmacy for home monitoring. . Remain up to date on vaccinations. Recommend shingles (Shingrix) vaccine from local pharmacy as Medicare does not cover the vaccine in the physician's office.  Interventions: . Comprehensive medication review performed.  Patient Self Care Activities:  . Self administers medications as prescribed . Checks  blood glucose twice weekly.  Initial goal documentation        Hyperlipidemia/CAD   Lipid Panel     Component Value Date/Time   CHOL 83 11/14/2017 1613   TRIG 199.0 (H) 11/14/2017 1613   HDL 32.50 (L) 11/14/2017 1613   CHOLHDL 3 11/14/2017 1613   VLDL 39.8 11/14/2017 1613   LDLCALC 11 11/14/2017 1613   LDLDIRECT 77.0 03/28/2015 1133    LDL goal < 70 Patient has failed these meds in past: none Patient is currently controlled on the following medications:   Atorvastatin 40 mg - 1/2 tablet daily  Nitroglycerin 0.4 mg SL - PRN   We discussed: hasn't taken nitroglycerin in a long time; we discussed keeping the medication in date for emergencies   Plan: Continue current medications; Refill nitroglycerin at pharmacy. Pt will contact cardiologist for refills.   AFIB   CBC Latest Ref Rng & Units 07/02/2019 03/13/2019 12/11/2018  WBC 3.8 - 10.8 Thousand/uL 6.4 5.7 6.9  Hemoglobin 13.2 - 17.1 g/dL 10.3(L) 10.2(L) 12.0(L)  Hematocrit 38.5 - 50.0 % 31.4(L) 32.0(L) 37.7(L)  Platelets 140 - 400 Thousand/uL 163 137(L) 154   Patient is currently rate controlled. Patient has failed these meds in past: Eliquis - tachycardia, headache Patient is currently controlled on the following medications:   Metoprolol succinate 25 mg - 1 tablet daily in the morning   Rivaroxaban 20 mg - 1 tablet daily with supper  We discussed: denies abnormal bleeding; CBC stable, gets Xarelto samples from cardio; denies missed doses  Plan: Continue current medications ,  Diabetes   Recent Relevant Labs: Lab Results  Component Value Date/Time   HGBA1C 7.7 (A) 04/06/2019 03:25 PM   HGBA1C 8.5 (A) 05/15/2018 02:57 PM   HGBA1C 8.7 (H) 11/14/2017 04:13 PM   HGBA1C 8.1 (H) 05/07/2017 11:17 AM   MICROALBUR 17.2 (H) 05/23/2012 09:35 AM   MICROALBUR 9.8 (H) 05/07/2011 12:54 PM   Hypoglycemia: denies any BG less than 100 mg/dL since 11/20  Checking BG: twice weekly Recent FBG Readings: 130-140 in the  morning (occasionally up to 170)  Patient has failed these meds in past: none Patient is currently controlled on the following medications:   Glimepiride 4 mg - 1 tablet BID  Metformin 500 mg - 1 tablet daily with breakfast  Last diabetic eye exam:  Lab Results  Component Value Date/Time   HMDIABEYEEXA No Retinopathy 06/20/2016 12:00 AM    Last diabetic foot exam:  Lab Results  Component Value Date/Time   HMDIABFOOTEX done 04/06/2019 12:00 AM    We discussed: watching diet more lately, lost a lot of weight from August to December due to cancer treatment; cardiology (Peter Martinique, MD) reduced metformin 11/20 from TID to daily. Current fasting blood glucose near goal of 80-130 mg/dL, with some room for flexibility due to history of hypoglycemia/metastatic prostate cancer.  Plan: Continue current medications. Repeat A1c at next office visit.   Hypertension   Office blood pressures are  BP Readings from Last 3 Encounters:  10/26/19 126/74  08/11/19 (!) 165/79  07/20/19 (!) 150/70  Patient has failed these meds in the past: furosemide discontinued due to orthostatic dizziness Patient checks BP at home: no home monitor  Patient is currently controlled on the following medications:   Metoprolol succinate 25 mg - 1 tablet daily in the morning   Ramipril 10 mg - 1 tablet daily in the evening  Furosemide 40 mg - 1 tablet daily as needed (not taking)  We discussed: wants to check on getting a BP monitor through insurance  Plan: Continue current medications; Discuss ordering blood pressure monitor.   Osteoarthritis   Patient has failed these meds in past: none Patient is currently controlled on the following medications:   Tramadol 50 mg - 1 tablet three times daily as needed  Tylenol 650 mg - 1 tablet daily as needed  We discussed: takes tramadol 1 tablet daily in the morning, takes tylenol 650 mg in the evening, pain is controlled  Plan: Continue current  medications  BPH   Patient has failed these meds in past: doxazosin (dizziness) Patient is currently controlled on the following medications:  Tamsulosin 0.4 mg - 1 tablet daily   We discussed: denies concerns  Plan: Continue current medications  Medication Management  OTCs: multivitamin men's 50+ - daily, vitamin C 1000 mg daily, calcium 600 mg daily, B12 2500 mcg 1 daily, pantothenic acid - not taking, docusate 250 mg - not taking, vitamin D 2000 IU - not taking  Pharmacy: [facility] [employer]) - brings it out to his car, satisfied with current pharmacy, [facility] would be a cost increase due to preferred pharmacy list   Adherence: no concerns  Affordability: Xarelto cost $24-$37 30 DS, picks up [place] at urologist  Vaccines: Recommend Shingrix  CCM Follow Up:  March 11, 2019 at 2:00 PM (telephone) (verify copay status prior to visit)  Debbora Dus, PharmD Clinical Pharmacist [facility] at [facility] [phone]

## 2019-11-13 NOTE — Patient Instructions (Signed)
November 12, 2019  Dear Adrian Singh,  It was a pleasure meeting you during our initial appointment on November 12, 2019. Below is a summary of the goals we discussed and components of chronic care management. Please contact me anytime with questions or concerns.   Visit Information  Goals Addressed            This Visit's Progress   . Pharmacy Care Plan       Current Barriers:  . Chronic Disease Management support, education, and care coordination needs related to hypertension, AFIB, coronary artery disease, GERD, type 2 diabetes, CKD, osteoarthritis, BPH, prostate cancer, history of CVA  Pharmacist Clinical Goal(s):  Marland Kitchen Maintain blood pressure within goal of less than 140/90 mmHg and prevent low blood pressure. Send prescription for blood pressure monitor to pharmacy for home monitoring. . Remain up to date on vaccinations. Recommend shingles (Shingrix) vaccine from local pharmacy as Medicare does not cover the vaccine in the physician's office.  Interventions: . Comprehensive medication review performed.  Patient Self Care Activities:  . Self administers medications as prescribed . Checks blood glucose twice weekly.  Initial goal documentation        [name] was given information about Chronic Care Management services today including:  1. CCM service includes personalized support from designated clinical staff supervised by his physician, including individualized plan of care and coordination with other care providers 2. 24/7 contact phone numbers for assistance for urgent and routine care needs. 3. Service will only be billed when office clinical staff spend 20 minutes or more in a month to coordinate care. 4. Only one practitioner may furnish and bill the service in a calendar month. 5. The patient may stop CCM services at any time (effective at the end of the month) by phone call to the office staff. 6. The patient will be responsible for cost sharing (co-pay) of up  to 20% of the service fee (after annual deductible is met).  Patient agreed to services and verbal consent obtained.   Print copy of patient instructions provided.  Telephone follow up appointment with pharmacy team member scheduled for:  March 11, 2019 at 2:00 PM for 6 month medication review  Debbora Dus, PharmD Clinical Pharmacist [facility] [phone]  Blood Pressure Record Sheet To take your blood pressure, you will need a blood pressure machine. You can buy a blood pressure machine (blood pressure monitor) at your clinic, drug store, or online. When choosing one, consider:  An automatic monitor that has an arm cuff.  A cuff that wraps snugly around your upper arm. You should be able to fit only one finger between your arm and the cuff.  A device that stores blood pressure reading results.  Do not choose a monitor that measures your blood pressure from your wrist or finger. Follow your health care provider's instructions for how to take your blood pressure. To use this form:  Get one reading in the morning (a.m.) before you take any medicines.  Get one reading in the evening (p.m.) before supper.  Take at least 2 readings with each blood pressure check. This makes sure the results are correct. Wait 1-2 minutes between measurements.  Write down the results in the spaces on this form.  Repeat this once a week, or as told by your health care provider.  Make a follow-up appointment with your health care provider to discuss the results. Blood pressure log Date: _______________________  a.m. _____________________(1st reading) _____________________(2nd  reading)  p.m. _____________________(1st reading) _____________________(2nd reading) Date: _______________________  a.m. _____________________(1st reading) _____________________(2nd reading)  p.m. _____________________(1st reading) _____________________(2nd reading) Date:  _______________________  a.m. _____________________(1st reading) _____________________(2nd reading)  p.m. _____________________(1st reading) _____________________(2nd reading) Date: _______________________  a.m. _____________________(1st reading) _____________________(2nd reading)  p.m. _____________________(1st reading) _____________________(2nd reading) Date: _______________________  a.m. _____________________(1st reading) _____________________(2nd reading)  p.m. _____________________(1st reading) _____________________(2nd reading) This information is not intended to replace advice given to you by your health care provider. Make sure you discuss any questions you have with your health care provider. Document Revised: 11/01/2017 Document Reviewed: 09/03/2017 Elsevier Patient Education  [place].

## 2019-11-17 DIAGNOSIS — C61 Malignant neoplasm of prostate: Secondary | ICD-10-CM | POA: Diagnosis not present

## 2019-11-17 DIAGNOSIS — Z5111 Encounter for antineoplastic chemotherapy: Secondary | ICD-10-CM | POA: Diagnosis not present

## 2019-11-17 DIAGNOSIS — C7951 Secondary malignant neoplasm of bone: Secondary | ICD-10-CM | POA: Diagnosis not present

## 2019-12-02 ENCOUNTER — Encounter: Payer: Self-pay | Admitting: Gastroenterology

## 2019-12-03 ENCOUNTER — Telehealth: Payer: Self-pay | Admitting: Cardiology

## 2019-12-03 NOTE — Telephone Encounter (Signed)
Spoke to patient Xarelto 20 mg samples left at [employer] office front desk.

## 2019-12-03 NOTE — Telephone Encounter (Signed)
Patient calling the office for samples of medication:   1.  What medication and dosage are you requesting samples for? rivaroxaban (XARELTO) 20 MG TABS tablet   2.  Are you currently out of this medication? no   

## 2019-12-07 ENCOUNTER — Other Ambulatory Visit: Payer: Self-pay | Admitting: Cardiology

## 2019-12-11 ENCOUNTER — Ambulatory Visit: Payer: Medicare Other

## 2019-12-11 ENCOUNTER — Other Ambulatory Visit: Payer: Self-pay

## 2019-12-11 DIAGNOSIS — I10 Essential (primary) hypertension: Secondary | ICD-10-CM

## 2019-12-11 DIAGNOSIS — Z8673 Personal history of transient ischemic attack (TIA), and cerebral infarction without residual deficits: Secondary | ICD-10-CM

## 2019-12-11 DIAGNOSIS — I2510 Atherosclerotic heart disease of native coronary artery without angina pectoris: Secondary | ICD-10-CM

## 2019-12-11 DIAGNOSIS — IMO0002 Reserved for concepts with insufficient information to code with codable children: Secondary | ICD-10-CM

## 2019-12-11 DIAGNOSIS — Z9861 Coronary angioplasty status: Secondary | ICD-10-CM

## 2019-12-11 DIAGNOSIS — E1140 Type 2 diabetes mellitus with diabetic neuropathy, unspecified: Secondary | ICD-10-CM

## 2019-12-11 MED ORDER — BLOOD PRESSURE MONITOR AUTOMAT DEVI: 1.0000 | Freq: Once | 0 refills | Status: AC

## 2019-12-11 NOTE — Telephone Encounter (Signed)
Debbora Dus, [place], Yanna Leaks P, CMA  Hi Larene Beach,   I was wondering if you could try sending the BP monitor kit to his pharmacy just in case insurance covers. I've had patients with medicare advantage plans that have been covered in the past. Likely not, but worth a try?   Sharyn Lull       Previous Messages   ----- Message -----  From: Pilar Grammes, CMA  Sent: 11/18/2019 11:59 AM EDT  To: Debbora Dus, [facility]   Medicare covers a device called an ambulatory blood pressure monitor for use once a year when ordered by a doctor. It does not cover regular "cuff" blood pressure monitors except for people undergoing dialysis at home.  ----- Message -----  From: Debbora Dus, [facility]  Sent: 11/18/2019 11:42 AM EST  To: Pilar Grammes, CMA   Roseanna Rainbow,   Dr. Silvio Pate mentioned he would ask you to send Mr. Farrelly prescription for a blood pressure monitor. I just wanted to follow up on this request.   Thank you!   Sharyn Lull

## 2019-12-11 NOTE — Telephone Encounter (Signed)
Rx sent electronically.  

## 2019-12-11 NOTE — Chronic Care Management (AMB) (Signed)
Chronic Care Management Pharmacy  Name: Adrian Singh  MRN: [id number] DOB: 06-Feb-1934  Chief Complaint/ HPI  [name],  86 y.o., male presents for their Follow-Up CCM visit with the clinical pharmacist via telephone. Visit completed with patient's wife, Dedra Skeens.  PCP : [name], MD  Their chronic conditions addressed today include: hypertension, AFIB, coronary artery disease, GERD, type 2 diabetes  Office Visits: none since last CCM visit on 11/12/19  Current Diagnosis/Assessment:  Hyperlipidemia/CAD   Lipid Panel     Component Value Date/Time   CHOL 83 11/14/2017 1613   TRIG 199.0 (H) 11/14/2017 1613   HDL 32.50 (L) 11/14/2017 1613   CHOLHDL 3 11/14/2017 1613   VLDL 39.8 11/14/2017 1613   LDLCALC 11 11/14/2017 1613   LDLDIRECT 77.0 03/28/2015 1133    LDL goal < 70 Patient has failed these meds in past: none Patient is currently controlled on the following medications:   Atorvastatin 40 mg - 1/2 tablet daily  Nitroglycerin 0.4 mg SL - PRN   We discussed: hasn't taken nitroglycerin in a long time; we discussed keeping the medication in date for emergencies   Plan: Continue current medications; Reminder to refill nitroglycerin at pharmacy. Pt will contact cardiologist for refills.    Diabetes   Recent Relevant Labs: Lab Results  Component Value Date/Time   HGBA1C 7.7 (A) 04/06/2019 03:25 PM   HGBA1C 8.5 (A) 05/15/2018 02:57 PM   HGBA1C 8.7 (H) 11/14/2017 04:13 PM   HGBA1C 8.1 (H) 05/07/2017 11:17 AM   MICROALBUR 17.2 (H) 05/23/2012 09:35 AM   MICROALBUR 9.8 (H) 05/07/2011 12:54 PM   A1c goal < 8% Checking BG: twice weekly Recent FBG Readings: spoke to wife today, will have assistant call to check in on BG readings in April   Patient has failed these meds in past: none Patient is currently controlled on the following medications:   Glimepiride 4 mg - 1 tablet BID  Metformin 500 mg - 1 tablet daily with breakfast  Last diabetic eye exam:    Lab Results  Component Value Date/Time   HMDIABEYEEXA No Retinopathy 06/20/2016 12:00 AM    Last diabetic foot exam:  Lab Results  Component Value Date/Time   HMDIABFOOTEX done 04/06/2019 12:00 AM    Recent changes: cardiology (Peter Martinique, MD) reduced metformin 11/20 from TID to daily  Plan: Continue current medications. Recommend repeat A1c at next office visit May 2021.  Will have team member call to check in on BG readings in April.  Hypertension   Office blood pressures are  BP Readings from Last 3 Encounters:  10/26/19 126/74  08/11/19 (!) 165/79  07/20/19 (!) 150/70   Patient has failed these meds in the past: furosemide discontinued due to orthostatic dizziness Patient checks BP at home: no home monitor  Patient is currently controlled on the following medications:   Metoprolol succinate 25 mg - 1 tablet daily in the morning   Ramipril 10 mg - 1 tablet daily in the evening  Furosemide 40 mg - 1 tablet daily as needed (not taking)  We discussed: wants to check on getting a BP monitor through insurance; has not purchased one yet, asked pt to check with pharmacy to see if they receive an order  Plan: Continue current medications; Pt will call back to let me know if he was able to get a BP monitor through insurance. Recommended purchasing most affordable option at drug store if not covered, but cost is concern.   Vaccines  Reviewed and discussed patient's vaccination history.    Immunization History  Administered Date(s) Administered  . Fluad Quad(high Dose 65+) 06/15/2019  . Influenza Split 08/18/2011  . Influenza,inj,Quad PF,6+ Mos 05/26/2013, 06/14/2014, 05/24/2015, 05/28/2016, 06/10/2017, 06/02/2018  . Pneumococcal Conjugate-13 06/14/2014  . Pneumococcal Polysaccharide-23 12/17/2006, 11/02/2016  . Td 12/23/2009   Plan: Recommended patient receive shingles vaccine if cost if affordable with insurance.   Medication Management  OTCs: multivitamin men's  50+ - daily, vitamin C 1000 mg daily, calcium 600 mg daily, B12 2500 mcg 1 daily  Pharmacy: [facility] ([employer])   Adherence: no concerns  Affordability:  No concerns, Xarelto samples usually, cost $24-$37 30 DS, picks up [place] at urologist  CCM Follow Up:  March 11, 2019 at 2:00 PM (telephone)   Debbora Dus, PharmD Clinical Pharmacist [facility] at [facility] [phone]

## 2019-12-22 DIAGNOSIS — C7951 Secondary malignant neoplasm of bone: Secondary | ICD-10-CM | POA: Diagnosis not present

## 2019-12-22 DIAGNOSIS — C61 Malignant neoplasm of prostate: Secondary | ICD-10-CM | POA: Diagnosis not present

## 2019-12-22 NOTE — Progress Notes (Signed)
Adrian Singh Date of Birth: Nov 19, 1933   History of Present Illness: Adrian Singh is seen today for follow up CAD.   He has a history of orthostatic dizziness. He was admitted in July 2500 with an embolic stroke felt to be due to paroxysmal Afib. He was anticoagulated with Eliquis. The patient developed tachycardia and HA on Eliquis that resolved when he was switched to Xarelto. Previous orthostatic dizziness improved with stopping diuretic and alpha blockers he was taking for enlarged prostate.   He was seen in the office on 08/10/2015 with shortness of breath with exertion. He had outpatient stress test done on 08/25/2015 which showed EF mildly decreased 45-54%, no significant ST elevation or depression during stress portion, overall considered low risk study, there was prior myocardial infarction with peri-infarct ischemia. The peri-infarct ischemia was felt to be new compared to the previous stress test, therefore outpatient cardiac catheterization was arranged. He underwent planned study on 09/22/2015 which showed 30% mid LAD lesion, 99% mid to distal left circumflex lesion, 50% OM 3 lesion. Due to poor support from right radial approach and concern for renal function, it was planned for staged PCI at a later time. He was admitted to [facility] on 10/06/15 for staged PCI/DES to mid LCx lesion that wa 99% stenosed. There was residual 30% mid LAD stenosis to be treated medically.  He was admitted in June 2017 with chest pain. Cardiac cath showed no new disease and prior stent was patent. Later when seen as an outpatient he developed orthostatic dizziness. maxzide was stopped and metoprolol was reduced with improvement.  When seen in 2017 he reported marked worsening of orthostatic dizziness.  He was back on Maxide and also taking Flomax for obstructive urinary symptoms. We stopped both of these medications and referred him to urology. He underwent laser TURP on 11/22/02 without complication. He has been  diagnosed with metastatic prostate CA to bone and is now receiving hormonal therapy with Zytiga. He did not tolerate this well and he had significant weight loss with this. Now getting hormonal therapy.   On follow up today he states he really feels great from a cardiac standpoint. No chest pain or dyspnea. Energy level is OK.  He does have some diarrhea. He only has dizzy spells if he stands too quickly. No edema.       Current Outpatient Medications on File Prior to Visit  Medication Sig Dispense Refill  . Ascorbic Acid (VITAMIN C) 1000 MG tablet Take 1,000 mg by mouth daily.    Marland Kitchen atorvastatin (LIPITOR) 40 MG tablet Take 1/2 (one-half) tablet by mouth once daily 45 tablet 3  . BAYER MICROLET LANCETS lancets Use to test blood sugar once daily dx: E11.40 100 each 1  . Blood Glucose Monitoring Suppl (CONTOUR NEXT EZ MONITOR) w/Device KIT USE TO TEST BLOOD SUGAR ONCE DAILY. Dx Code E11.40 1 kit 0  . calcium carbonate (CALCIUM 600) 1500 (600 Ca) MG TABS tablet Take by mouth daily with breakfast.    . Cholecalciferol (VITAMIN D3) 2000 units TABS Take 2,000 Units by mouth daily.    . Cyanocobalamin 2500 MCG CHEW Chew by mouth daily.    Marland Kitchen docusate sodium (COLACE) 250 MG capsule Take 250 mg by mouth daily.    . furosemide (LASIX) 40 MG tablet Take 1 tablet (40 mg total) by mouth daily as needed. 15 tablet 0  . glimepiride (AMARYL) 4 MG tablet Take 1 tablet by mouth twice daily 180 tablet 3  . glucose  blood (CONTOUR NEXT TEST) test strip Check blood sugar once daily and as directed.Dx Code E11.40 100 each 1  . metFORMIN (GLUCOPHAGE) 500 MG tablet Take 1 tablet (500 mg total) by mouth daily with breakfast. 270 tablet 3  . metoprolol succinate (TOPROL-XL) 25 MG 24 hr tablet Take 1 tablet (25 mg total) by mouth daily. 90 tablet 3  . Multiple Vitamin (MULTIVITAMIN WITH MINERALS) TABS tablet Take 1 tablet by mouth daily. ONE-A-DAY FOR MEN    . nitroGLYCERIN (NITROSTAT) 0.4 MG SL tablet Place 1 tablet (0.4  mg total) under the tongue every 5 (five) minutes as needed for chest pain. 25 tablet 0  . Pantothenic Acid 500 MG TABS Take by mouth.    . ramipril (ALTACE) 10 MG capsule Take 1 capsule by mouth once daily 90 capsule 0  . rivaroxaban (XARELTO) 20 MG TABS tablet Take 1 tablet (20 mg total) by mouth daily with supper. 90 tablet 3  . tamsulosin (FLOMAX) 0.4 MG CAPS capsule Take 1 capsule (0.4 mg total) by mouth daily. 90 capsule 3  . traMADol (ULTRAM) 50 MG tablet TAKE 1 TABLET BY MOUTH THREE TIMES DAILY AS NEEDED 30 tablet 0   No current facility-administered medications on file prior to visit.    Allergies  Allergen Reactions  . Soma [Carisoprodol] Other (See Comments)    "did a number on me"  . Doxazosin Other (See Comments)    dizziness    Past Medical History:  Diagnosis Date  . Arthritis    "legs, back" (10/06/2015)  . Atrial fibrillation ([place])    x1  . CAD (coronary artery disease)    nonobstructive  . Cancer ([place])    skin cancer on ear (froze it off) and back (cut it off)  . Dysrhythmia   . Esophageal reflux    hx of  . History of kidney stones   . Hx of heart artery stent   . Hypertrophy of prostate without urinary obstruction and other lower urinary tract symptoms (LUTS)   . Osteoarthrosis, unspecified whether generalized or localized, unspecified site   . Prostate cancer ([place])   . Skin cancer   . Stroke [facility]) 03/2014   "had a series of mini strokes; maybe 4"; denies residual on 10/06/2015  . Thrombocytopenia ([place])   . Type II diabetes mellitus ([place])    type 2  . Unspecified essential hypertension     Past Surgical History:  Procedure Laterality Date  . BACK SURGERY     2001  . CARDIAC CATHETERIZATION  12/2007  . CARDIAC CATHETERIZATION N/A 09/22/2015   Procedure: Left Heart Cath and Coronary Angiography;  Surgeon: Ellianne Gowen M Martinique, MD;  Location: [place] CV LAB;  Service: Cardiovascular;  Laterality: N/A;  . CARDIAC CATHETERIZATION N/A 10/06/2015   Procedure:  Coronary Stent Intervention;  Surgeon: Khamya Topp M Martinique, MD;  Location: [place] CV LAB;  Service: Cardiovascular;  Laterality: N/A;  . CARDIAC CATHETERIZATION N/A 02/29/2016   Procedure: Left Heart Cath and Coronary Angiography;  Surgeon: Jettie Booze, MD;  Location: [place] CV LAB;  Service: Cardiovascular;  Laterality: N/A;  . COLONOSCOPY W/ BIOPSIES AND POLYPECTOMY    . CORONARY STENT PLACEMENT  10/06/2015   [name]  with DES  . EAR CYST EXCISION N/A 04/06/2015   Procedure: EXCISION OF SCALP CYST;  Surgeon: Donnie Mesa, MD;  Location: [place];  Service: General;  Laterality: N/A;  . ESOPHAGOGASTRODUODENOSCOPY (EGD) WITH ESOPHAGEAL DILATION  2001   with dilation  . LUMBAR DISC  SURGERY  09/26/1999   "cleaned out arthritis and bone spurs"  . SHOULDER ARTHROSCOPY W/ ROTATOR CUFF REPAIR Left 2005  . THULIUM LASER TURP (TRANSURETHRAL RESECTION OF PROSTATE) N/A 01/21/2017   Procedure: Marcelino Duster LASER TURP (TRANSURETHRAL RESECTION OF PROSTATE) CAUTERIZATION OF BLADDER LESION;  Surgeon: Franchot Gallo, MD;  Location: [facility];  Service: Urology;  Laterality: N/A;    Social History   Tobacco Use  Smoking Status Former Smoker  . Years: 3.00  . Types: Cigarettes  Smokeless Tobacco Never Used  Tobacco Comment   " Quit smoking by age 36; was a someday smoker "    Social History   Substance and Sexual Activity  Alcohol Use No    Family History  Problem Relation Age of Onset  . Stroke Father   . Peripheral vascular disease Father        amputation  . Heart failure Mother        CHF  . Coronary artery disease Mother   . Heart attack Mother 34       Multiple  . COPD Brother   . Heart disease Brother   . Cancer Brother        Bone  . Lung cancer Sister     Review of Systems: The review of systems is as noted in HPI.  All other systems were reviewed and are negative.  Physical Exam: BP (!) 146/74   Pulse 78   Ht _0  (1.727 m)   Wt 187 lb 12.8 oz (85.2 kg)   SpO2 99%    BMI 28.55 kg/m   GENERAL:  Well appearing  WM in NAD HEENT:  PERRL, EOMI, sclera are clear. Oropharynx is clear. NECK:  No jugular venous distention, carotid upstroke brisk and symmetric, no bruits, no thyromegaly or adenopathy LUNGS:  Clear to auscultation bilaterally CHEST:  Tender to palpatation in the left chest and lateral ribs. HEART:  RRR,  PMI not displaced or sustained,S1 and S2 within normal limits, no S3, no S4: no clicks, no rubs, no murmurs ABD:  Soft, nontender. BS +, no masses or bruits. No hepatomegaly, no splenomegaly EXT:  2 + pulses throughout, no edema, no cyanosis no clubbing SKIN:  Warm and dry.  No rashes NEURO:  Alert and oriented x 3. Cranial nerves II through XII intact. PSYCH:  Cognitively intact    LABORATORY DATA: Lab Results  Component Value Date   WBC 6.4 07/02/2019   HGB 10.3 (L) 07/02/2019   HCT 31.4 (L) 07/02/2019   PLT 163 07/02/2019   GLUCOSE 72 07/02/2019   CHOL 83 11/14/2017   TRIG 199.0 (H) 11/14/2017   HDL 32.50 (L) 11/14/2017   LDLDIRECT 77.0 03/28/2015   LDLCALC 11 11/14/2017   ALT 22 03/13/2019   AST 25 03/13/2019   NA 141 07/02/2019   K 4.5 07/02/2019   CL 109 07/02/2019   CREATININE 1.12 (H) 07/02/2019   BUN 19 07/02/2019   CO2 27 07/02/2019   TSH 3.33 08/26/2013   PSA 158.00 (H) 01/14/2017   INR 1.45 02/29/2016   HGBA1C 7.7 (A) 04/06/2019   MICROALBUR 17.2 (H) 05/23/2012     Assessment / Plan: 1. Orthostatic hypotension. . It is exacerbated by medication- especially diuretics and Flomax. He apparently still on Flomax but has not taken lasix.   2. CAD s/p DES of mid LCx in January 2017. He is asymptomatic. Repeat cardiac cath in June 2017 showed continued patency. Off antiplatelet therapy. Continue Xarelto.  3. Stage IV prostate CA  with bony metastases. Followed by oncology and urology.   4. CKD stage 3.   5. Paroxysmal Afib. Continue metoprolol and Xarelto. Asymptomatic.  6. History of CVA.  7. DM per  Dr  Silvio Pate.  8. HTN mildly elevated today. Will monitor  9. Hypercholesterolemia on lipitor. Will add lipid panel to next blood draw.     I will follow up in 6 months.

## 2019-12-24 ENCOUNTER — Ambulatory Visit (INDEPENDENT_AMBULATORY_CARE_PROVIDER_SITE_OTHER): Payer: Medicare Other | Admitting: Cardiology

## 2019-12-24 ENCOUNTER — Encounter: Payer: Self-pay | Admitting: Cardiology

## 2019-12-24 ENCOUNTER — Other Ambulatory Visit: Payer: Self-pay

## 2019-12-24 VITALS — BP 146/74 | HR 78 | Ht 68.0 in | Wt 187.8 lb

## 2019-12-24 DIAGNOSIS — I2510 Atherosclerotic heart disease of native coronary artery without angina pectoris: Secondary | ICD-10-CM | POA: Diagnosis not present

## 2019-12-24 DIAGNOSIS — I480 Paroxysmal atrial fibrillation: Secondary | ICD-10-CM | POA: Diagnosis not present

## 2019-12-24 DIAGNOSIS — I951 Orthostatic hypotension: Secondary | ICD-10-CM

## 2019-12-24 DIAGNOSIS — I10 Essential (primary) hypertension: Secondary | ICD-10-CM | POA: Diagnosis not present

## 2019-12-24 DIAGNOSIS — Z7901 Long term (current) use of anticoagulants: Secondary | ICD-10-CM | POA: Diagnosis not present

## 2019-12-24 DIAGNOSIS — Z9861 Coronary angioplasty status: Secondary | ICD-10-CM

## 2020-01-13 ENCOUNTER — Telehealth: Payer: Self-pay

## 2020-01-13 ENCOUNTER — Other Ambulatory Visit: Payer: Self-pay

## 2020-01-13 ENCOUNTER — Ambulatory Visit: Payer: Medicare Other

## 2020-01-13 DIAGNOSIS — IMO0002 Reserved for concepts with insufficient information to code with codable children: Secondary | ICD-10-CM

## 2020-01-13 DIAGNOSIS — I10 Essential (primary) hypertension: Secondary | ICD-10-CM

## 2020-01-13 DIAGNOSIS — E1140 Type 2 diabetes mellitus with diabetic neuropathy, unspecified: Secondary | ICD-10-CM

## 2020-01-13 NOTE — Chronic Care Management (AMB) (Signed)
Chronic Care Management Pharmacy  Name: Adrian Singh  MRN: [id number] DOB: 1934/08/30  Chief Complaint/ HPI  Adrian Singh,  86 y.o., male presents for their Follow-Up CCM visit with the clinical pharmacist via telephone.   PCP : Adrian Carbon, MD  Their chronic conditions include: hypertension, AFIB, coronary artery disease, GERD, type 2 diabetes, prostate cancer  Patient concerns: denies concerns, feeling a lot better lately with change in cancer treatment, more energy/active  Office Visits:   12/24/19: Cardiology - orthostatic hypotension, exacerbated by diuretics, flomax; CAD sp/ DES 2017 - continue Xarelto for AFIB, prostate cancer stage IV, CKD stage 3, AFIB - continue metoprolol, DM per PCP, HTN - mildly elevated today, will monitor, on Lipitor, check lipid panel  Current Diagnosis/Assessment:  Hyperlipidemia/CAD   Lipid Panel     Component Value Date/Time   CHOL 83 11/14/2017 1613   TRIG 199.0 (H) 11/14/2017 1613   HDL 32.50 (L) 11/14/2017 1613   CHOLHDL 3 11/14/2017 1613   VLDL 39.8 11/14/2017 1613   LDLCALC 11 11/14/2017 1613   LDLDIRECT 77.0 03/28/2015 1133    LDL goal < 70 Patient has failed these meds in past: none Patient is currently controlled on the following medications:   Atorvastatin 40 mg - 1/2 tablet daily  Nitroglycerin 0.4 mg SL - PRN   We discussed: Medication adherence atorvastatin; pt has timely refills - last filled 12/02/19 90 DS (#45); repeat lipid panel 4/29 with urology office  Plan: Continue current medications; Cardiology ordered annual lipid panel, pt is going to lab tomorrow. Will assess labs at follow up. No adherence concerns.  Diabetes   Recent Relevant Labs: Lab Results  Component Value Date/Time   HGBA1C 7.7 (A) 04/06/2019 03:25 PM   HGBA1C 8.5 (A) 05/15/2018 02:57 PM   HGBA1C 8.7 (H) 11/14/2017 04:13 PM   HGBA1C 8.1 (H) 05/07/2017 11:17 AM   MICROALBUR 17.2 (H) 05/23/2012 09:35 AM   MICROALBUR 9.8 (H) 05/07/2011  12:54 PM   A1c goal < 8% Checking BG: twice weekly in the morning Recent FBG Readings: 100-150 Hypoglycemia: none in past in last few months since reducing metformin to 1 tab daily  Patient has failed these meds in past: none Patient is currently controlled on the following medications:   Glimepiride 4 mg - 1 tablet BID   Metformin 500 mg - 1 tablet daily with breakfast  Last diabetic eye exam:  Lab Results  Component Value Date/Time   HMDIABEYEEXA No Retinopathy 06/20/2016 12:00 AM    Last diabetic foot exam:  Lab Results  Component Value Date/Time   HMDIABFOOTEX done 04/06/2019 12:00 AM    Recent changes: cardiology (Peter Martinique, MD) reduced metformin 11/20 from TID to daily  Plan: Continue current medications. Recommend repeat A1c at next office visit May 2021.  Call if any hypoglycemia.   Hypertension   Office blood pressures are  BP Readings from Last 3 Encounters:  12/24/19 (!) 146/74  10/26/19 126/74  08/11/19 (!) 165/79   Patient has failed these meds in the past: furosemide discontinued due to orthostatic dizziness Patient checks BP at home: no home monitor Slightly elevated at cardiology visit on 12/24/19  Patient is currently controlled on the following medications:   Metoprolol succinate 25 mg - 1 tablet daily in the morning   Ramipril 10 mg - 1 tablet daily in the evening  Furosemide 40 mg - 1 tablet daily as needed (not taking)  We discussed: rx for BP monitor was not sent, will  request rx from [place]; pt states if insurance will not cover, his daughter may be able to assist with getting him a BP monitor, but would like to check with pharmacy first  Plan: Continue current medications; Send rx for blood pressure kit.   Vaccines   Reviewed and discussed patient's vaccination history.    Immunization History  Administered Date(s) Administered  . Fluad Quad(high Dose 65+) 06/15/2019  . Influenza Split 08/18/2011  . Influenza,inj,Quad PF,6+ Mos  05/26/2013, 06/14/2014, 05/24/2015, 05/28/2016, 06/10/2017, 06/02/2018  . Pneumococcal Conjugate-13 06/14/2014  . Pneumococcal Polysaccharide-23 12/17/2006, 11/02/2016  . Td 12/23/2009   Plan: Recommended patient receive shingles vaccine if cost if affordable with insurance. Interested in [place] vaccine; assisted with scheduling appt at [facility] in [place].   Medication Management  OTCs: multivitamin men's 50+ - daily, vitamin C 1000 mg daily, calcium 600 mg daily, B12 2500 mcg 1 daily  Pharmacy: [facility] ([place])   Adherence: no concerns  Affordability:  No concerns, Xarelto samples usually, cost $24-$37 30 DS, picks up [place] at urologist  CCM Follow Up: March 11, 2019 at 2:00 PM (telephone)   Debbora Dus, PharmD Clinical Pharmacist [facility] at [facility] [phone]

## 2020-01-13 NOTE — Telephone Encounter (Signed)
Great, I went ahead and called his insurance and his plan does not cover OTC DME. Left voicemail updating patient.   Debbora Dus, PharmD Clinical Pharmacist [facility] at [facility] [phone]

## 2020-01-13 NOTE — Patient Instructions (Addendum)
Dear [name],  I hope you are doing well. Below is a summary of the goals we discussed during our telephone appointment on January 13, 2020. Please contact me anytime with questions or concerns.   Visit Information  Goals Addressed            This Visit's Progress   . Pharmacy Care Plan       CARE PLAN  Current Barriers:  . Chronic Disease Management support, education, and care coordination needs related to hypertension, type 2 diabetes  Pharmacist Clinical Goal(s):  Over the next 2 months, patient will work with PharmD and primary care provider to address the following goals: Marland Kitchen Diabetes: Maintain A1c goal of less than 8% with fasting blood glucose within 100-150 mg/dL; prevent hypoglycemia (blood glucose less than 70 mg/dL) . Hypertension: Maintain blood pressure within goal of less than 140/90 mmHg and prevent low blood pressure. Send prescription for blood pressure monitor to pharmacy for home monitoring. . Vaccinations: Remain up to date on vaccinations. Recommend shingles (Shingrix) vaccine from local pharmacy.  Interventions: . Comprehensive medication review performed. . Assisted with questions regarding scheduling COVID-19 vaccine   Patient Self Care Activities:  . Continue to check blood glucose twice weekly; Call if any blood glucose readings less than 70 mg/dL. Marland Kitchen Check with pharmacy for price on blood pressure monitoring kit.   Please see past updates related to this goal by clicking on the "Past Updates" button in the selected goal        The patient verbalized understanding of instructions provided today and agreed to receive a mailed copy of patient instruction and/or educational materials.  Telephone follow up appointment with pharmacy team member scheduled for: March 10, 2020 at 2:00 PM (telephone)  Debbora Dus, PharmD Clinical Pharmacist [facility] at [facility] [phone]

## 2020-01-13 NOTE — Telephone Encounter (Signed)
Pt would still like to see if insurance will cover BP monitoring kit. States pharmacy never received a prescription. Could we send an rx to his pharmacy?  Debbora Dus, PharmD Clinical Pharmacist [facility] at [facility] [phone]

## 2020-01-13 NOTE — Telephone Encounter (Signed)
I sent the prescription to the pharmacy 12-11-19. There is a telephone encounter created for it.

## 2020-01-15 DIAGNOSIS — C61 Malignant neoplasm of prostate: Secondary | ICD-10-CM | POA: Diagnosis not present

## 2020-01-21 ENCOUNTER — Telehealth: Payer: Self-pay | Admitting: Cardiology

## 2020-01-21 ENCOUNTER — Other Ambulatory Visit: Payer: Self-pay | Admitting: Cardiology

## 2020-01-21 DIAGNOSIS — C61 Malignant neoplasm of prostate: Secondary | ICD-10-CM | POA: Diagnosis not present

## 2020-01-21 DIAGNOSIS — R3912 Poor urinary stream: Secondary | ICD-10-CM | POA: Diagnosis not present

## 2020-01-21 DIAGNOSIS — C7951 Secondary malignant neoplasm of bone: Secondary | ICD-10-CM | POA: Diagnosis not present

## 2020-01-21 DIAGNOSIS — Z5111 Encounter for antineoplastic chemotherapy: Secondary | ICD-10-CM | POA: Diagnosis not present

## 2020-01-21 MED ORDER — RIVAROXABAN 20 MG PO TABS: 20.0000 mg | ORAL_TABLET | Freq: Every day | ORAL | 3 refills | Status: DC

## 2020-01-21 NOTE — Telephone Encounter (Signed)
Spoke with patient. One week's worth of Xarelto placed at front desk for pick-up. Prescription refill request sent to preferred pharmacy. Patient verbalized understanding.

## 2020-01-21 NOTE — Telephone Encounter (Signed)
*  STAT* If patient is at the pharmacy, call can be transferred to refill team.   1. Which medications need to be refilled? (please list name of each medication and dose if known) rivaroxaban (XARELTO) 20 MG TABS tablet  2. Which pharmacy/location (including street and city if local pharmacy) is medication to be sent to? [place], [place]   3. Do they need a 30 day or 90 day supply? [place]

## 2020-01-21 NOTE — Telephone Encounter (Signed)
Patient calling the office for samples of medication:   1.  What medication and dosage are you requesting samples for? rivaroxaban (XARELTO) 20 MG TABS tablet  2.  Are you currently out of this medication? Will be out tomorrow - refill request sent to Refill Team. Patient states he is in [place] right now though.

## 2020-01-29 ENCOUNTER — Encounter: Payer: Self-pay | Admitting: Internal Medicine

## 2020-01-29 ENCOUNTER — Ambulatory Visit (INDEPENDENT_AMBULATORY_CARE_PROVIDER_SITE_OTHER): Payer: Medicare Other | Admitting: Internal Medicine

## 2020-01-29 ENCOUNTER — Other Ambulatory Visit: Payer: Self-pay

## 2020-01-29 DIAGNOSIS — D696 Thrombocytopenia, unspecified: Secondary | ICD-10-CM

## 2020-01-29 DIAGNOSIS — IMO0002 Reserved for concepts with insufficient information to code with codable children: Secondary | ICD-10-CM

## 2020-01-29 DIAGNOSIS — E08621 Diabetes mellitus due to underlying condition with foot ulcer: Secondary | ICD-10-CM

## 2020-01-29 DIAGNOSIS — I480 Paroxysmal atrial fibrillation: Secondary | ICD-10-CM | POA: Diagnosis not present

## 2020-01-29 DIAGNOSIS — C61 Malignant neoplasm of prostate: Secondary | ICD-10-CM

## 2020-01-29 DIAGNOSIS — E1140 Type 2 diabetes mellitus with diabetic neuropathy, unspecified: Secondary | ICD-10-CM

## 2020-01-29 DIAGNOSIS — E1165 Type 2 diabetes mellitus with hyperglycemia: Secondary | ICD-10-CM | POA: Diagnosis not present

## 2020-01-29 DIAGNOSIS — L97402 Non-pressure chronic ulcer of unspecified heel and midfoot with fat layer exposed: Secondary | ICD-10-CM

## 2020-01-29 LAB — POCT GLYCOSYLATED HEMOGLOBIN (HGB A1C): Hemoglobin A1C: 7.8 % — AB (ref 4.0–5.6)

## 2020-01-29 LAB — HM DIABETES FOOT EXAM

## 2020-01-29 NOTE — Assessment & Plan Note (Signed)
Still regular Continues on the xarelto

## 2020-01-29 NOTE — Progress Notes (Signed)
Subjective:    Patient ID: Adrian Singh, male    DOB: [birth year], 86 y.o.   MRN: [id number]  HPI Here for follow up of diabetes and persistent foot ulcer This visit occurred during the SARS-CoV-2 public health emergency.  Safety protocols were in place, including screening questions prior to the visit, additional usage of staff PPE, and extensive cleaning of exam room while observing appropriate contact time as indicated for disinfecting solutions.   Has gone over everything with CCM with Selinda Eon the pharmacist Feels he is comfortable with this Is only taking 1 metformin daily---had diarrhea with higher doses  Did have some lower sugars---as low as 80 Low in evening a couple of times--did feel it (but then up 220 after eating)  Ulcer is about the same Bleeds some No other visits for this No pain and not inflamed Clear drainage--just a little  Heart okay No chest pain No SOB No palpitations Just works in yard--no other exercise Started a small garden--but got really sore with this. (probably from the tiller)  Last PSA is up Now on xtandi for the cancer  Last GFR 45 Platelets 137  Current Outpatient Medications on File Prior to Visit  Medication Sig Dispense Refill  . Ascorbic Acid (VITAMIN C) 1000 MG tablet Take 1,000 mg by mouth daily.    Marland Kitchen atorvastatin (LIPITOR) 40 MG tablet Take 1/2 (one-half) tablet by mouth once daily 45 tablet 3  . BAYER MICROLET LANCETS lancets Use to test blood sugar once daily dx: E11.40 100 each 1  . Blood Glucose Monitoring Suppl (CONTOUR NEXT EZ MONITOR) w/Device KIT USE TO TEST BLOOD SUGAR ONCE DAILY. Dx Code E11.40 1 kit 0  . calcium carbonate (CALCIUM 600) 1500 (600 Ca) MG TABS tablet Take by mouth daily with breakfast.    . Cholecalciferol (VITAMIN D3) 2000 units TABS Take 2,000 Units by mouth daily.    . Cyanocobalamin 2500 MCG CHEW Chew by mouth daily.    Marland Kitchen docusate sodium (COLACE) 250 MG capsule Take 250 mg by mouth daily.    .  furosemide (LASIX) 40 MG tablet Take 1 tablet (40 mg total) by mouth daily as needed. 15 tablet 0  . glimepiride (AMARYL) 4 MG tablet Take 1 tablet by mouth twice daily 180 tablet 3  . glucose blood (CONTOUR NEXT TEST) test strip Check blood sugar once daily and as directed.Dx Code E11.40 100 each 1  . metFORMIN (GLUCOPHAGE) 500 MG tablet Take 1 tablet (500 mg total) by mouth daily with breakfast. 270 tablet 3  . metoprolol succinate (TOPROL-XL) 25 MG 24 hr tablet Take 1 tablet (25 mg total) by mouth daily. 90 tablet 3  . Multiple Vitamin (MULTIVITAMIN WITH MINERALS) TABS tablet Take 1 tablet by mouth daily. ONE-A-DAY FOR MEN    . nitroGLYCERIN (NITROSTAT) 0.4 MG SL tablet Place 1 tablet (0.4 mg total) under the tongue every 5 (five) minutes as needed for chest pain. 25 tablet 0  . Pantothenic Acid 500 MG TABS Take by mouth.    . ramipril (ALTACE) 10 MG capsule Take 1 capsule by mouth once daily 90 capsule 0  . rivaroxaban (XARELTO) 20 MG TABS tablet Take 1 tablet (20 mg total) by mouth daily with supper. 90 tablet 3  . tamsulosin (FLOMAX) 0.4 MG CAPS capsule Take 1 capsule (0.4 mg total) by mouth daily. 90 capsule 3  . traMADol (ULTRAM) 50 MG tablet TAKE 1 TABLET BY MOUTH THREE TIMES DAILY AS NEEDED 30 tablet 0   No  current facility-administered medications on file prior to visit.    Allergies  Allergen Reactions  . Soma [Carisoprodol] Other (See Comments)    "did a number on me"  . Doxazosin Other (See Comments)    dizziness    Past Medical History:  Diagnosis Date  . Arthritis    "legs, back" (10/06/2015)  . Atrial fibrillation ([place])    x1  . CAD (coronary artery disease)    nonobstructive  . Cancer ([place])    skin cancer on ear (froze it off) and back (cut it off)  . Dysrhythmia   . Esophageal reflux    hx of  . History of kidney stones   . Hx of heart artery stent   . Hypertrophy of prostate without urinary obstruction and other lower urinary tract symptoms (LUTS)   .  Osteoarthrosis, unspecified whether generalized or localized, unspecified site   . Prostate cancer ([place])   . Skin cancer   . Stroke [facility]) 03/2014   "had a series of mini strokes; maybe 4"; denies residual on 10/06/2015  . Thrombocytopenia ([place])   . Type II diabetes mellitus ([place])    type 2  . Unspecified essential hypertension     Past Surgical History:  Procedure Laterality Date  . BACK SURGERY     2001  . CARDIAC CATHETERIZATION  12/2007  . CARDIAC CATHETERIZATION N/A 09/22/2015   Procedure: Left Heart Cath and Coronary Angiography;  Surgeon: Peter M Martinique, MD;  Location: [facility];  Service: Cardiovascular;  Laterality: N/A;  . CARDIAC CATHETERIZATION N/A 10/06/2015   Procedure: Coronary Stent Intervention;  Surgeon: Peter M Martinique, MD;  Location: [facility];  Service: Cardiovascular;  Laterality: N/A;  . CARDIAC CATHETERIZATION N/A 02/29/2016   Procedure: Left Heart Cath and Coronary Angiography;  Surgeon: Jettie Booze, MD;  Location: [facility];  Service: Cardiovascular;  Laterality: N/A;  . COLONOSCOPY W/ BIOPSIES AND POLYPECTOMY    . CORONARY STENT PLACEMENT  10/06/2015   [name]  with DES  . EAR CYST EXCISION N/A 04/06/2015   Procedure: EXCISION OF SCALP CYST;  Surgeon: Donnie Mesa, MD;  Location: [facility];  Service: General;  Laterality: N/A;  . ESOPHAGOGASTRODUODENOSCOPY (EGD) WITH ESOPHAGEAL DILATION  2001   with dilation  . LUMBAR DISC SURGERY  09/26/1999   "cleaned out arthritis and bone spurs"  . SHOULDER ARTHROSCOPY W/ ROTATOR CUFF REPAIR Left 2005  . THULIUM LASER TURP (TRANSURETHRAL RESECTION OF PROSTATE) N/A 01/21/2017   Procedure: Marcelino Duster LASER TURP (TRANSURETHRAL RESECTION OF PROSTATE) CAUTERIZATION OF BLADDER LESION;  Surgeon: Franchot Gallo, MD;  Location: [facility];  Service: Urology;  Laterality: N/A;    Family History  Problem Relation Age of Onset  . Stroke Father   . Peripheral vascular disease Father        amputation  . Heart  failure Mother        CHF  . Coronary artery disease Mother   . Heart attack Mother 26       Multiple  . COPD Brother   . Heart disease Brother   . Cancer Brother        Bone  . Lung cancer Sister     Social History   Socioeconomic History  . Marital status: Married    Spouse name: Not on file  . Number of children: 4  . Years of education: Not on file  . Highest education level: Not on file  Occupational History  . Occupation: Radiographer, therapeutic  for [employer]    Employer: RETIRED  Tobacco Use  . Smoking status: Former Smoker    Years: 3.00    Types: Cigarettes  . Smokeless tobacco: Never Used  . Tobacco comment: " Quit smoking by age 25; was a someday smoker "  Substance and Sexual Activity  . Alcohol use: No  . Drug use: No  . Sexual activity: Never  Other Topics Concern  . Not on file  Social History Narrative   No living will   Plans wife and then children to make health care decisions for him if unable   Would request at least attempts at resuscitation but no prolonged life support   Doesn't think he would want tube feeds if cognitively unaware   Social Determinants of Health   Financial Resource Strain:   . Difficulty of Paying Living Expenses:   Food Insecurity:   . Worried About Charity fundraiser in the Last Year:   . Arboriculturist in the Last Year:   Transportation Needs:   . Film/video editor (Medical):   Marland Kitchen Lack of Transportation (Non-Medical):   Physical Activity:   . Days of Exercise per Week:   . Minutes of Exercise per Session:   Stress:   . Feeling of Stress :   Social Connections:   . Frequency of Communication with Friends and Family:   . Frequency of Social Gatherings with Friends and Family:   . Attends Religious Services:   . Active Member of Clubs or Organizations:   . Attends Archivist Meetings:   Marland Kitchen Marital Status:   Intimate Partner Violence:   . Fear of Current or Ex-Partner:   . Emotionally Abused:   Marland Kitchen  Physically Abused:   . Sexually Abused:    Review of Systems Sleeping fine Appetite is good Weight is down some    Objective:   Physical Exam  Constitutional: He appears well-developed. No distress.  Neck: No thyromegaly present.  Cardiovascular: Normal rate, regular rhythm and normal heart sounds. Exam reveals no gallop.  No murmur heard. Faint pedal pulses  Respiratory: Effort normal and breath sounds normal. No respiratory distress. He has no wheezes. He has no rales.  Musculoskeletal:        General: No edema.  Lymphadenopathy:    He has no cervical adenopathy.  Neurological:  Decreased sensation in feet  Skin:  Same callous with small opening in plantar right foot. No inflammation  Psychiatric: He has a normal mood and affect. His behavior is normal.           Assessment & Plan:

## 2020-01-29 NOTE — Assessment & Plan Note (Signed)
Chronic and stable He will let me know if any increased discharge or redness

## 2020-01-29 NOTE — Assessment & Plan Note (Addendum)
Has been trying to improve eating, etc Lab Results  Component Value Date   HGBA1C 7.8 (A) 01/29/2020   Control is some better Will continue current metformin, glimepiride

## 2020-01-29 NOTE — Assessment & Plan Note (Signed)
Mild but no bleeding bruising Will recheck next time

## 2020-01-29 NOTE — Assessment & Plan Note (Signed)
PSA has spiked On xtandi now Following closely with the urologist

## 2020-02-12 ENCOUNTER — Telehealth: Payer: Medicare Other

## 2020-02-23 DIAGNOSIS — C7951 Secondary malignant neoplasm of bone: Secondary | ICD-10-CM | POA: Diagnosis not present

## 2020-02-23 DIAGNOSIS — C61 Malignant neoplasm of prostate: Secondary | ICD-10-CM | POA: Diagnosis not present

## 2020-03-04 ENCOUNTER — Other Ambulatory Visit: Payer: Self-pay | Admitting: Cardiology

## 2020-03-10 ENCOUNTER — Other Ambulatory Visit: Payer: Self-pay

## 2020-03-10 ENCOUNTER — Ambulatory Visit: Payer: Medicare Other

## 2020-03-10 DIAGNOSIS — I10 Essential (primary) hypertension: Secondary | ICD-10-CM

## 2020-03-10 DIAGNOSIS — IMO0002 Reserved for concepts with insufficient information to code with codable children: Secondary | ICD-10-CM

## 2020-03-10 DIAGNOSIS — E1140 Type 2 diabetes mellitus with diabetic neuropathy, unspecified: Secondary | ICD-10-CM

## 2020-03-10 NOTE — Chronic Care Management (AMB) (Signed)
Chronic Care Management Pharmacy  Name: Adrian Singh  MRN: [id number] DOB: 10/07/1933  Chief Complaint/ HPI  Adrian Singh,  86 y.o., male presents for their Follow-Up CCM visit with the clinical pharmacist via telephone.   PCP : Adrian Carbon, MD  Their chronic conditions include: hypertension, AFIB, coronary artery disease, GERD, type 2 diabetes, prostate cancer  Patient concerns: multiple medication questions   Office Visits:   01/29/20: PCP visit - DM control is improving, continue metformin, glimepiride   12/24/19: Cardiology - orthostatic hypotension, exacerbated by diuretics, flomax; CAD sp/ DES 2017 - continue Xarelto for AFIB, prostate cancer stage IV, CKD stage 3, AFIB - continue metoprolol, DM per PCP, HTN - mildly elevated today, will monitor, on Lipitor, check lipid panel  Current Diagnosis/Assessment: Goals    . Pharmacy Care Plan     CARE PLAN  Current Barriers:  . Chronic Disease Management support, education, and care coordination needs related to hypertension, type 2 diabetes  Pharmacist Clinical Goal(s):  Over the next 6 months, patient will work with PharmD and primary care provider to address the following goals: Marland Kitchen Diabetes: Maintain A1c goal of less than 8% with fasting blood glucose within 100-150 mg/dL; prevent hypoglycemia (blood glucose less than 70 mg/dL) . Hypertension: Maintain blood pressure within goal of less than 140/90 mmHg and prevent low blood pressure.  . Vaccinations: Remain up to date on vaccinations. Recommend shingles (Shingrix) vaccine from local pharmacy.  Interventions: . Comprehensive medication review performed.  Patient Self Care Activities:  . Continue to check blood glucose twice weekly; Call if any blood glucose readings less than 70 mg/dL. Marland Kitchen Continue medications as prescribed  Please see past updates related to this goal by clicking on the "Past Updates" button in the selected goal       Hyperlipidemia/CAD    Lipid Panel     Component Value Date/Time   CHOL 83 11/14/2017 1613   TRIG 199.0 (H) 11/14/2017 1613   HDL 32.50 (L) 11/14/2017 1613   CHOLHDL 3 11/14/2017 1613   VLDL 39.8 11/14/2017 1613   LDLCALC 11 11/14/2017 1613   LDLDIRECT 77.0 03/28/2015 1133    LDL goal < 70 Patient has failed these meds in past: none Patient is currently controlled on the following medications:   Atorvastatin 40 mg - 1/2 tablet daily  Nitroglycerin 0.4 mg SL - PRN   We discussed: < 5 day gap between refills on atorvastatin   Plan: Continue current medications; Recommend updating lipid panel   Diabetes   Recent Relevant Labs: Lab Results  Component Value Date/Time   HGBA1C 7.8 (A) 01/29/2020 01:16 PM   HGBA1C 7.7 (A) 04/06/2019 03:25 PM   HGBA1C 8.7 (H) 11/14/2017 04:13 PM   HGBA1C 8.1 (H) 05/07/2017 11:17 AM   MICROALBUR 17.2 (H) 05/23/2012 09:35 AM   MICROALBUR 9.8 (H) 05/07/2011 12:54 PM   A1c goal < 8% Checking BG: twice weekly in the morning Recent FBG Readings: 100-150 Hypoglycemia: denies  Patient has failed these meds in past: none Patient is currently controlled on the following medications:   Glimepiride 4 mg - 1 tablet BID   Metformin 500 mg - 1 tablet daily with breakfast  Last diabetic eye exam:  Lab Results  Component Value Date/Time   HMDIABEYEEXA No Retinopathy 06/20/2016 12:00 AM    Last diabetic foot exam:  Lab Results  Component Value Date/Time   HMDIABFOOTEX done 01/29/2020 12:00 AM   Plan: Continue current medications  Hypertension   Office  blood pressures are  BP Readings from Last 3 Encounters:  01/29/20 140/80  12/24/19 (!) 146/74  10/26/19 126/74   Patient has failed these meds in the past: furosemide discontinued due to orthostatic dizziness Patient checks BP at home: no home monitor - cost limitations, insurance does not cover cost  Pt reports BP normal at last 2 office visits   BP goal < 140/90 mmHg Patient is currently controlled on the  following medications:   Metoprolol succinate 25 mg - 1 tablet daily in the morning   Ramipril 10 mg - 1 tablet daily in the evening  Plan: Continue current medications   Osteoarthritis   Pain - location pelvis and hip  Patient has failed these meds in past: Tramadol Patient is currently controlled on the following medications:   Ibuprofen 200 mg EC - 1 tablet TID PRN  We discussed: Pt reports started OTC ibuprofen per Dr. Tammi Singh at urology/cancer treatment center. Reports it is working for pain better than tramadol. Stopped tramadol about 2 weeks ago. Currently taking ibuprofen 200 mg EC - 1 at breakfast, 1 with evening meal, and 1 at bedtime for about a week, seems pain has improved some.  Plan: Continue current medications; Recommend alternating ibuprofen with Tylenol 650 mg to reduce ibuprofen use.   CCM Follow Up: telephone visit in 6 months   Adrian Singh, PharmD Clinical Pharmacist [facility] at [facility] [phone]

## 2020-03-25 DIAGNOSIS — C7951 Secondary malignant neoplasm of bone: Secondary | ICD-10-CM | POA: Diagnosis not present

## 2020-03-25 DIAGNOSIS — C61 Malignant neoplasm of prostate: Secondary | ICD-10-CM | POA: Diagnosis not present

## 2020-03-28 NOTE — Patient Instructions (Addendum)
Dear [name],  Below is a summary of the goals we discussed during our follow up appointment on March 10, 2020. Please contact me anytime with questions or concerns.   Visit Information  Goals Addressed            This Visit's Progress   . Pharmacy Care Plan       CARE PLAN  Current Barriers:  . Chronic Disease Management support, education, and care coordination needs related to hypertension, type 2 diabetes  Pharmacist Clinical Goal(s):  Over the next 6 months, patient will work with PharmD and primary care provider to address the following goals: Marland Kitchen Diabetes: Maintain A1c goal of less than 8% with fasting blood glucose within 100-150 mg/dL; prevent hypoglycemia (blood glucose less than 70 mg/dL) . Hypertension: Maintain blood pressure within goal of less than 140/90 mmHg and prevent low blood pressure.  . Vaccinations: Remain up to date on vaccinations. Recommend shingles (Shingrix) vaccine from local pharmacy.  Interventions: . Comprehensive medication review performed.  Patient Self Care Activities:  . Continue to check blood glucose twice weekly; Call if any blood glucose readings less than 70 mg/dL. Marland Kitchen Continue medications as prescribed  Please see past updates related to this goal by clicking on the "Past Updates" button in the selected goal       Patient verbalizes understanding of instructions provided today.   Telephone follow up appointment with pharmacy team member scheduled for: 09/13/20 at 9 AM (telephone)  Debbora Dus, PharmD Clinical Pharmacist [facility] at [facility] [phone]

## 2020-04-25 ENCOUNTER — Telehealth: Payer: Self-pay | Admitting: Cardiology

## 2020-04-25 NOTE — Telephone Encounter (Signed)
Spoke to patient Xarelto 20 mg samples left at [employer] office front desk.

## 2020-04-25 NOTE — Telephone Encounter (Signed)
New Message   Patient calling the office for samples of medication:   1.  What medication and dosage are you requesting samples for?rivaroxaban (XARELTO) 20 MG TABS tablet    2.  Are you currently out of this medication? 3 remaining

## 2020-04-26 DIAGNOSIS — C61 Malignant neoplasm of prostate: Secondary | ICD-10-CM | POA: Diagnosis not present

## 2020-04-26 DIAGNOSIS — C7951 Secondary malignant neoplasm of bone: Secondary | ICD-10-CM | POA: Diagnosis not present

## 2020-04-27 ENCOUNTER — Telehealth: Payer: Self-pay

## 2020-04-27 NOTE — Telephone Encounter (Signed)
Okay  Certainly needs blood work--- concerns are the cancer, PMR, etc Will address at the visit---doesn't sound critical

## 2020-04-27 NOTE — Telephone Encounter (Addendum)
For last month pt has generalized weakness that is worsening; pt uses 2 canes to walk thru the house. Last couple of weeks pt been mostly staying in bed. Pt just really feels tired. No CP, SOB, H/A . Pt has lightheadedness on and off. Dramamine makes pt sleepy and when wakes up usually lightheadedness is gone. Pt said his hips feel like they want to give way at times that is why pt is walking with 2 canes and last few wks staying in bed most of the time.pt has prostate and bone CA and pt wonders if could get Vit B 12 shots to see if that would help pt feel stronger. Pt rescheduled appt from next wk to  04/29/20 at 11:30. Pt said could not come in before latest appt on 04/29/20 due to transportation. UC & ED precautions given and pt voiced understanding. FYI to Dr Silvio Pate.

## 2020-04-29 ENCOUNTER — Other Ambulatory Visit: Payer: Self-pay

## 2020-04-29 ENCOUNTER — Ambulatory Visit (INDEPENDENT_AMBULATORY_CARE_PROVIDER_SITE_OTHER): Payer: Medicare Other | Admitting: Internal Medicine

## 2020-04-29 ENCOUNTER — Encounter: Payer: Self-pay | Admitting: Internal Medicine

## 2020-04-29 VITALS — BP 118/70 | HR 72 | Temp 98.1°F | Ht 68.0 in | Wt 168.0 lb

## 2020-04-29 DIAGNOSIS — E441 Mild protein-calorie malnutrition: Secondary | ICD-10-CM

## 2020-04-29 DIAGNOSIS — C61 Malignant neoplasm of prostate: Secondary | ICD-10-CM | POA: Diagnosis not present

## 2020-04-29 DIAGNOSIS — E1165 Type 2 diabetes mellitus with hyperglycemia: Secondary | ICD-10-CM | POA: Diagnosis not present

## 2020-04-29 DIAGNOSIS — IMO0002 Reserved for concepts with insufficient information to code with codable children: Secondary | ICD-10-CM

## 2020-04-29 DIAGNOSIS — E1140 Type 2 diabetes mellitus with diabetic neuropathy, unspecified: Secondary | ICD-10-CM

## 2020-04-29 DIAGNOSIS — R5383 Other fatigue: Secondary | ICD-10-CM | POA: Diagnosis not present

## 2020-04-29 LAB — SEDIMENTATION RATE: Sed Rate: 101 mm/hr — ABNORMAL HIGH (ref 0–20)

## 2020-04-29 LAB — COMPREHENSIVE METABOLIC PANEL
ALT: 7 U/L (ref 0–53)
AST: 21 U/L (ref 0–37)
Albumin: 2.9 g/dL — ABNORMAL LOW (ref 3.5–5.2)
Alkaline Phosphatase: 183 U/L — ABNORMAL HIGH (ref 39–117)
BUN: 20 mg/dL (ref 6–23)
CO2: 25 mEq/L (ref 19–32)
Calcium: 8.2 mg/dL — ABNORMAL LOW (ref 8.4–10.5)
Chloride: 101 mEq/L (ref 96–112)
Creatinine, Ser: 1.07 mg/dL (ref 0.40–1.50)
GFR: 65.88 mL/min (ref >60.00)
Glucose, Bld: 245 mg/dL — ABNORMAL HIGH (ref 70–99)
Potassium: 4.7 mEq/L (ref 3.5–5.1)
Sodium: 136 mEq/L (ref 135–145)
Total Bilirubin: 0.5 mg/dL (ref 0.2–1.2)
Total Protein: 6.5 g/dL (ref 6.0–8.3)

## 2020-04-29 LAB — CBC
HCT: 26.7 % — ABNORMAL LOW (ref 39.0–52.0)
Hemoglobin: 8.5 g/dL — ABNORMAL LOW (ref 13.0–17.0)
MCHC: 31.9 g/dL (ref 30.0–36.0)
MCV: 75 fl — ABNORMAL LOW (ref 78.0–100.0)
Platelets: 224 10*3/uL (ref 150.0–400.0)
RBC: 3.56 Mil/uL — ABNORMAL LOW (ref 4.22–5.81)
RDW: 19.6 % — ABNORMAL HIGH (ref 11.5–15.5)
WBC: 6.6 10*3/uL (ref 4.0–10.5)

## 2020-04-29 LAB — PSA: PSA: 217.15 ng/mL — ABNORMAL HIGH (ref 0.10–4.00)

## 2020-04-29 LAB — T4, FREE: Free T4: 0.98 ng/dL (ref 0.60–1.60)

## 2020-04-29 LAB — HEMOGLOBIN A1C: Hgb A1c MFr Bld: 7.9 % — ABNORMAL HIGH (ref 4.6–6.5)

## 2020-04-29 LAB — VITAMIN B12: Vitamin B-12: >1526 pg/mL — ABNORMAL HIGH (ref 211–911)

## 2020-04-29 NOTE — Assessment & Plan Note (Signed)
Sugars have been lower Some mild spells If A1c is down--would cut back on the glimepiride

## 2020-04-29 NOTE — Progress Notes (Signed)
Subjective:    Patient ID: Adrian Singh, male    DOB: Apr 05, 1934, 86 y.o.   MRN: [id number]  HPI Here due to fatigue---wondering about B12 shots Here with daughter This visit occurred during the SARS-CoV-2 public health emergency.  Safety protocols were in place, including screening questions prior to the visit, additional usage of staff PPE, and extensive cleaning of exam room while observing appropriate contact time as indicated for disinfecting solutions.   Feeling fatigued for past month or so Thinks it may be related to second COVID vaccine "Gives out" just walking to living room or bathroom Appetite is poor---lost considerable weight  Not really SOB Some pelvic pain--and around hip joints Some neck pain as well---some better lately  Still on xtandi for the prostate cancer Cut back to 2 pills daily recently Had good days and bad days before---but now not having good days PSA not rechecked since May (last 58 he thinks)  Checking sugars No hypoglycemic spells recently--but has been in the 60's and 70's at times  Current Outpatient Medications on File Prior to Visit  Medication Sig Dispense Refill  . Ascorbic Acid (VITAMIN C) 1000 MG tablet Take 1,000 mg by mouth daily.    Marland Kitchen atorvastatin (LIPITOR) 40 MG tablet Take 1/2 (one-half) tablet by mouth once daily 45 tablet 3  . BAYER MICROLET LANCETS lancets Use to test blood sugar once daily dx: E11.40 100 each 1  . Blood Glucose Monitoring Suppl (CONTOUR NEXT EZ MONITOR) w/Device KIT USE TO TEST BLOOD SUGAR ONCE DAILY. Dx Code E11.40 1 kit 0  . calcium carbonate (CALCIUM 600) 1500 (600 Ca) MG TABS tablet Take by mouth daily with breakfast.    . Cholecalciferol (VITAMIN D3) 2000 units TABS Take 2,000 Units by mouth daily.    . Cyanocobalamin 2500 MCG CHEW Chew by mouth daily.    Marland Kitchen docusate sodium (COLACE) 250 MG capsule Take 250 mg by mouth daily.    . furosemide (LASIX) 40 MG tablet Take 1 tablet (40 mg total) by mouth daily as  needed. 15 tablet 0  . glimepiride (AMARYL) 4 MG tablet Take 1 tablet by mouth twice daily 180 tablet 3  . glucose blood (CONTOUR NEXT TEST) test strip Check blood sugar once daily and as directed.Dx Code E11.40 100 each 1  . metFORMIN (GLUCOPHAGE) 500 MG tablet Take 1 tablet (500 mg total) by mouth daily with breakfast. 270 tablet 3  . metoprolol succinate (TOPROL-XL) 25 MG 24 hr tablet Take 1 tablet (25 mg total) by mouth daily. 90 tablet 3  . Multiple Vitamin (MULTIVITAMIN WITH MINERALS) TABS tablet Take 1 tablet by mouth daily. ONE-A-DAY FOR MEN    . nitroGLYCERIN (NITROSTAT) 0.4 MG SL tablet Place 1 tablet (0.4 mg total) under the tongue every 5 (five) minutes as needed for chest pain. 25 tablet 0  . Pantothenic Acid 500 MG TABS Take by mouth.    . ramipril (ALTACE) 10 MG capsule Take 1 capsule by mouth once daily 90 capsule 1  . rivaroxaban (XARELTO) 20 MG TABS tablet Take 1 tablet (20 mg total) by mouth daily with supper. 90 tablet 3  . tamsulosin (FLOMAX) 0.4 MG CAPS capsule Take 1 capsule (0.4 mg total) by mouth daily. 90 capsule 3  . traMADol (ULTRAM) 50 MG tablet TAKE 1 TABLET BY MOUTH THREE TIMES DAILY AS NEEDED 30 tablet 0   No current facility-administered medications on file prior to visit.    Allergies  Allergen Reactions  . Soma [Carisoprodol]  Other (See Comments)    "did a number on me"  . Doxazosin Other (See Comments)    dizziness    Past Medical History:  Diagnosis Date  . Arthritis    "legs, back" (10/06/2015)  . Atrial fibrillation ([place])    x1  . CAD (coronary artery disease)    nonobstructive  . Cancer ([place])    skin cancer on ear (froze it off) and back (cut it off)  . Dysrhythmia   . Esophageal reflux    hx of  . History of kidney stones   . Hx of heart artery stent   . Hypertrophy of prostate without urinary obstruction and other lower urinary tract symptoms (LUTS)   . Osteoarthrosis, unspecified whether generalized or localized, unspecified site   .  Prostate cancer ([place])   . Skin cancer   . Stroke [facility]) 03/2014   "had a series of mini strokes; maybe 4"; denies residual on 10/06/2015  . Thrombocytopenia ([place])   . Type II diabetes mellitus ([place])    type 2  . Unspecified essential hypertension     Past Surgical History:  Procedure Laterality Date  . BACK SURGERY     2001  . CARDIAC CATHETERIZATION  12/2007  . CARDIAC CATHETERIZATION N/A 09/22/2015   Procedure: Left Heart Cath and Coronary Angiography;  Surgeon: Peter M Martinique, MD;  Location: [place] [facility];  Service: Cardiovascular;  Laterality: N/A;  . CARDIAC CATHETERIZATION N/A 10/06/2015   Procedure: Coronary Stent Intervention;  Surgeon: Peter M Martinique, MD;  Location: [place] [facility];  Service: Cardiovascular;  Laterality: N/A;  . CARDIAC CATHETERIZATION N/A 02/29/2016   Procedure: Left Heart Cath and Coronary Angiography;  Surgeon: Jettie Booze, MD;  Location: [place] [facility];  Service: Cardiovascular;  Laterality: N/A;  . COLONOSCOPY W/ BIOPSIES AND POLYPECTOMY    . CORONARY STENT PLACEMENT  10/06/2015   [name]  with DES  . EAR CYST EXCISION N/A 04/06/2015   Procedure: EXCISION OF SCALP CYST;  Surgeon: Donnie Mesa, MD;  Location: [place];  Service: General;  Laterality: N/A;  . ESOPHAGOGASTRODUODENOSCOPY (EGD) WITH ESOPHAGEAL DILATION  2001   with dilation  . LUMBAR DISC SURGERY  09/26/1999   "cleaned out arthritis and bone spurs"  . SHOULDER ARTHROSCOPY W/ ROTATOR CUFF REPAIR Left 2005  . THULIUM LASER TURP (TRANSURETHRAL RESECTION OF PROSTATE) N/A 01/21/2017   Procedure: Marcelino Duster LASER TURP (TRANSURETHRAL RESECTION OF PROSTATE) CAUTERIZATION OF BLADDER LESION;  Surgeon: Franchot Gallo, MD;  Location: [facility];  Service: Urology;  Laterality: N/A;    Family History  Problem Relation Age of Onset  . Stroke Father   . Peripheral vascular disease Father        amputation  . Heart failure Mother        CHF  . Coronary artery disease Mother   . Heart attack Mother  30       Multiple  . COPD Brother   . Heart disease Brother   . Cancer Brother        Bone  . Lung cancer Sister     Social History   Socioeconomic History  . Marital status: Married    Spouse name: Not on file  . Number of children: 4  . Years of education: Not on file  . Highest education level: Not on file  Occupational History  . Occupation: Radiographer, 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[employer]    Employer: RETIRED  Tobacco Use  . Smoking status: Former Smoker  Years: 3.00    Types: Cigarettes  . Smokeless tobacco: Never Used  . Tobacco comment: " Quit smoking by age 4; was a someday smoker "  Vaping Use  . Vaping Use: Never used  Substance and Sexual Activity  . Alcohol use: No  . Drug use: No  . Sexual activity: Never  Other Topics Concern  . Not on file  Social History Narrative   No living will   Plans wife and then children to make health care decisions for him if unable   Would request at least attempts at resuscitation but no prolonged life support   Doesn't think he would want tube feeds if cognitively unaware   Social Determinants of Health   Financial Resource Strain:   . Difficulty of Paying Living Expenses:   Food Insecurity:   . Worried About Charity fundraiser in the Last Year:   . Arboriculturist in the Last Year:   Transportation Needs:   . Film/video editor (Medical):   Marland Kitchen Lack of Transportation (Non-Medical):   Physical Activity:   . Days of Exercise per Week:   . Minutes of Exercise per Session:   Stress:   . Feeling of Stress :   Social Connections:   . Frequency of Communication with Friends and Family:   . Frequency of Social Gatherings with Friends and Family:   . Attends Religious Services:   . Active Member of Clubs or Organizations:   . Attends Archivist Meetings:   Marland Kitchen Marital Status:   Intimate Partner Violence:   . Fear of Current or Ex-Partner:   . Emotionally Abused:   Marland Kitchen Physically Abused:   . Sexually Abused:     Review  of Systems Not sleeping well Hasn't needed the furosemide of late Some depression ---seems to be reactive to decline in condition     Objective:   Physical Exam Constitutional:      Comments: Appears chronically ill now with obvious wasting since last visit  HENT:     Mouth/Throat:     Comments: No lesions Full dentures Cardiovascular:     Rate and Rhythm: Normal rate and regular rhythm.     Heart sounds: No murmur heard.  No gallop.   Pulmonary:     Effort: Pulmonary effort is normal.     Breath sounds: Normal breath sounds. No wheezing or rales.  Abdominal:     Palpations: Abdomen is soft.     Tenderness: There is no abdominal tenderness.  Musculoskeletal:     Cervical back: Neck supple.     Right lower leg: No edema.     Left lower leg: No edema.  Lymphadenopathy:     Cervical: No cervical adenopathy.  Psychiatric:     Comments: Some psychomotor retardation            Assessment & Plan:

## 2020-04-29 NOTE — Assessment & Plan Note (Signed)
Marked weight loss sine last visit Will see if we can find the primary problem---may want to consider mirtazapine

## 2020-04-29 NOTE — Assessment & Plan Note (Signed)
And wasting Not sure if this is due to medication--like xtandi, the cancer itself or another condition (like B12 deficiency, PMR, etc) Will check labs

## 2020-04-29 NOTE — Assessment & Plan Note (Signed)
It is possible that the cancer---or the xtandi--could be causing his decline Will check PSA If stable or lower---would try some time off the xtandi to see if he feels better

## 2020-04-30 ENCOUNTER — Observation Stay (HOSPITAL_COMMUNITY): Payer: Medicare Other

## 2020-04-30 ENCOUNTER — Encounter (HOSPITAL_COMMUNITY): Payer: Self-pay

## 2020-04-30 ENCOUNTER — Other Ambulatory Visit: Payer: Self-pay

## 2020-04-30 ENCOUNTER — Inpatient Hospital Stay (HOSPITAL_COMMUNITY)
Admission: EM | Admit: 2020-04-30 | Discharge: 2020-05-12 | DRG: 064 | Disposition: A | Discharge status: Skilled Nursing Facility | Payer: Medicare Other | Attending: Internal Medicine | Admitting: Internal Medicine

## 2020-04-30 ENCOUNTER — Emergency Department (HOSPITAL_COMMUNITY): Payer: Medicare Other

## 2020-04-30 DIAGNOSIS — I130 Hypertensive heart and chronic kidney disease with heart failure and stage 1 through stage 4 chronic kidney disease, or unspecified chronic kidney disease: Secondary | ICD-10-CM | POA: Diagnosis not present

## 2020-04-30 DIAGNOSIS — I69354 Hemiplegia and hemiparesis following cerebral infarction affecting left non-dominant side: Secondary | ICD-10-CM | POA: Diagnosis not present

## 2020-04-30 DIAGNOSIS — Z7189 Other specified counseling: Secondary | ICD-10-CM

## 2020-04-30 DIAGNOSIS — L89109 Pressure ulcer of unspecified part of back, unspecified stage: Secondary | ICD-10-CM | POA: Insufficient documentation

## 2020-04-30 DIAGNOSIS — J342 Deviated nasal septum: Secondary | ICD-10-CM | POA: Diagnosis not present

## 2020-04-30 DIAGNOSIS — N179 Acute kidney failure, unspecified: Secondary | ICD-10-CM | POA: Diagnosis not present

## 2020-04-30 DIAGNOSIS — R778 Other specified abnormalities of plasma proteins: Secondary | ICD-10-CM | POA: Diagnosis present

## 2020-04-30 DIAGNOSIS — Z85828 Personal history of other malignant neoplasm of skin: Secondary | ICD-10-CM | POA: Diagnosis not present

## 2020-04-30 DIAGNOSIS — J3489 Other specified disorders of nose and nasal sinuses: Secondary | ICD-10-CM | POA: Diagnosis not present

## 2020-04-30 DIAGNOSIS — Z79891 Long term (current) use of opiate analgesic: Secondary | ICD-10-CM

## 2020-04-30 DIAGNOSIS — Z825 Family history of asthma and other chronic lower respiratory diseases: Secondary | ICD-10-CM | POA: Diagnosis not present

## 2020-04-30 DIAGNOSIS — G939 Disorder of brain, unspecified: Secondary | ICD-10-CM | POA: Diagnosis not present

## 2020-04-30 DIAGNOSIS — Z955 Presence of coronary angioplasty implant and graft: Secondary | ICD-10-CM

## 2020-04-30 DIAGNOSIS — C61 Malignant neoplasm of prostate: Secondary | ICD-10-CM | POA: Diagnosis present

## 2020-04-30 DIAGNOSIS — R569 Unspecified convulsions: Secondary | ICD-10-CM | POA: Diagnosis not present

## 2020-04-30 DIAGNOSIS — R4589 Other symptoms and signs involving emotional state: Secondary | ICD-10-CM

## 2020-04-30 DIAGNOSIS — I700 Atherosclerosis of aorta: Secondary | ICD-10-CM | POA: Diagnosis present

## 2020-04-30 DIAGNOSIS — Z8673 Personal history of transient ischemic attack (TIA), and cerebral infarction without residual deficits: Secondary | ICD-10-CM | POA: Diagnosis not present

## 2020-04-30 DIAGNOSIS — L8990 Pressure ulcer of unspecified site, unspecified stage: Secondary | ICD-10-CM | POA: Insufficient documentation

## 2020-04-30 DIAGNOSIS — I340 Nonrheumatic mitral (valve) insufficiency: Secondary | ICD-10-CM | POA: Diagnosis not present

## 2020-04-30 DIAGNOSIS — R7989 Other specified abnormal findings of blood chemistry: Secondary | ICD-10-CM | POA: Diagnosis present

## 2020-04-30 DIAGNOSIS — I639 Cerebral infarction, unspecified: Secondary | ICD-10-CM | POA: Diagnosis not present

## 2020-04-30 DIAGNOSIS — M25552 Pain in left hip: Secondary | ICD-10-CM | POA: Diagnosis not present

## 2020-04-30 DIAGNOSIS — W19XXXA Unspecified fall, initial encounter: Secondary | ICD-10-CM | POA: Diagnosis not present

## 2020-04-30 DIAGNOSIS — J90 Pleural effusion, not elsewhere classified: Secondary | ICD-10-CM | POA: Diagnosis not present

## 2020-04-30 DIAGNOSIS — I5032 Chronic diastolic (congestive) heart failure: Secondary | ICD-10-CM | POA: Diagnosis not present

## 2020-04-30 DIAGNOSIS — D649 Anemia, unspecified: Secondary | ICD-10-CM | POA: Diagnosis present

## 2020-04-30 DIAGNOSIS — R0603 Acute respiratory distress: Secondary | ICD-10-CM | POA: Diagnosis not present

## 2020-04-30 DIAGNOSIS — S4992XA Unspecified injury of left shoulder and upper arm, initial encounter: Secondary | ICD-10-CM | POA: Diagnosis not present

## 2020-04-30 DIAGNOSIS — S40022A Contusion of left upper arm, initial encounter: Secondary | ICD-10-CM | POA: Diagnosis present

## 2020-04-30 DIAGNOSIS — R627 Adult failure to thrive: Secondary | ICD-10-CM | POA: Diagnosis not present

## 2020-04-30 DIAGNOSIS — K8020 Calculus of gallbladder without cholecystitis without obstruction: Secondary | ICD-10-CM | POA: Diagnosis not present

## 2020-04-30 DIAGNOSIS — K219 Gastro-esophageal reflux disease without esophagitis: Secondary | ICD-10-CM | POA: Diagnosis present

## 2020-04-30 DIAGNOSIS — J9811 Atelectasis: Secondary | ICD-10-CM | POA: Diagnosis not present

## 2020-04-30 DIAGNOSIS — R21 Rash and other nonspecific skin eruption: Secondary | ICD-10-CM | POA: Diagnosis not present

## 2020-04-30 DIAGNOSIS — Z7901 Long term (current) use of anticoagulants: Secondary | ICD-10-CM

## 2020-04-30 DIAGNOSIS — E1165 Type 2 diabetes mellitus with hyperglycemia: Secondary | ICD-10-CM | POA: Diagnosis present

## 2020-04-30 DIAGNOSIS — M25551 Pain in right hip: Secondary | ICD-10-CM | POA: Diagnosis not present

## 2020-04-30 DIAGNOSIS — Z888 Allergy status to other drugs, medicaments and biological substances status: Secondary | ICD-10-CM

## 2020-04-30 DIAGNOSIS — Z20822 Contact with and (suspected) exposure to covid-19: Secondary | ICD-10-CM | POA: Diagnosis not present

## 2020-04-30 DIAGNOSIS — W1830XA Fall on same level, unspecified, initial encounter: Secondary | ICD-10-CM | POA: Diagnosis present

## 2020-04-30 DIAGNOSIS — T796XXA Traumatic ischemia of muscle, initial encounter: Secondary | ICD-10-CM | POA: Diagnosis not present

## 2020-04-30 DIAGNOSIS — I2510 Atherosclerotic heart disease of native coronary artery without angina pectoris: Secondary | ICD-10-CM | POA: Diagnosis not present

## 2020-04-30 DIAGNOSIS — I63531 Cerebral infarction due to unspecified occlusion or stenosis of right posterior cerebral artery: Secondary | ICD-10-CM | POA: Diagnosis not present

## 2020-04-30 DIAGNOSIS — R0602 Shortness of breath: Secondary | ICD-10-CM | POA: Diagnosis not present

## 2020-04-30 DIAGNOSIS — J9601 Acute respiratory failure with hypoxia: Secondary | ICD-10-CM | POA: Diagnosis not present

## 2020-04-30 DIAGNOSIS — I480 Paroxysmal atrial fibrillation: Secondary | ICD-10-CM | POA: Diagnosis not present

## 2020-04-30 DIAGNOSIS — I248 Other forms of acute ischemic heart disease: Secondary | ICD-10-CM | POA: Diagnosis not present

## 2020-04-30 DIAGNOSIS — R911 Solitary pulmonary nodule: Secondary | ICD-10-CM | POA: Diagnosis present

## 2020-04-30 DIAGNOSIS — R0600 Dyspnea, unspecified: Secondary | ICD-10-CM

## 2020-04-30 DIAGNOSIS — Y92009 Unspecified place in unspecified non-institutional (private) residence as the place of occurrence of the external cause: Secondary | ICD-10-CM

## 2020-04-30 DIAGNOSIS — C7951 Secondary malignant neoplasm of bone: Secondary | ICD-10-CM | POA: Diagnosis not present

## 2020-04-30 DIAGNOSIS — R10811 Right upper quadrant abdominal tenderness: Secondary | ICD-10-CM | POA: Diagnosis not present

## 2020-04-30 DIAGNOSIS — S79911A Unspecified injury of right hip, initial encounter: Secondary | ICD-10-CM | POA: Diagnosis not present

## 2020-04-30 DIAGNOSIS — M25512 Pain in left shoulder: Secondary | ICD-10-CM | POA: Diagnosis not present

## 2020-04-30 DIAGNOSIS — N1831 Chronic kidney disease, stage 3a: Secondary | ICD-10-CM | POA: Diagnosis not present

## 2020-04-30 DIAGNOSIS — E869 Volume depletion, unspecified: Secondary | ICD-10-CM | POA: Diagnosis present

## 2020-04-30 DIAGNOSIS — E11649 Type 2 diabetes mellitus with hypoglycemia without coma: Secondary | ICD-10-CM | POA: Diagnosis present

## 2020-04-30 DIAGNOSIS — Z8249 Family history of ischemic heart disease and other diseases of the circulatory system: Secondary | ICD-10-CM

## 2020-04-30 DIAGNOSIS — Z823 Family history of stroke: Secondary | ICD-10-CM

## 2020-04-30 DIAGNOSIS — Z87891 Personal history of nicotine dependence: Secondary | ICD-10-CM

## 2020-04-30 DIAGNOSIS — T796XXD Traumatic ischemia of muscle, subsequent encounter: Secondary | ICD-10-CM | POA: Diagnosis not present

## 2020-04-30 DIAGNOSIS — R4781 Slurred speech: Secondary | ICD-10-CM | POA: Diagnosis present

## 2020-04-30 DIAGNOSIS — E785 Hyperlipidemia, unspecified: Secondary | ICD-10-CM | POA: Diagnosis present

## 2020-04-30 DIAGNOSIS — I25119 Atherosclerotic heart disease of native coronary artery with unspecified angina pectoris: Secondary | ICD-10-CM | POA: Diagnosis not present

## 2020-04-30 DIAGNOSIS — R6884 Jaw pain: Secondary | ICD-10-CM | POA: Diagnosis present

## 2020-04-30 DIAGNOSIS — E1142 Type 2 diabetes mellitus with diabetic polyneuropathy: Secondary | ICD-10-CM | POA: Diagnosis not present

## 2020-04-30 DIAGNOSIS — I69391 Dysphagia following cerebral infarction: Secondary | ICD-10-CM | POA: Diagnosis not present

## 2020-04-30 DIAGNOSIS — M545 Low back pain: Secondary | ICD-10-CM | POA: Diagnosis not present

## 2020-04-30 DIAGNOSIS — I10 Essential (primary) hypertension: Secondary | ICD-10-CM | POA: Diagnosis present

## 2020-04-30 DIAGNOSIS — Z7984 Long term (current) use of oral hypoglycemic drugs: Secondary | ICD-10-CM

## 2020-04-30 DIAGNOSIS — G9389 Other specified disorders of brain: Secondary | ICD-10-CM

## 2020-04-30 DIAGNOSIS — R748 Abnormal levels of other serum enzymes: Secondary | ICD-10-CM | POA: Diagnosis present

## 2020-04-30 DIAGNOSIS — J189 Pneumonia, unspecified organism: Secondary | ICD-10-CM | POA: Diagnosis not present

## 2020-04-30 DIAGNOSIS — R531 Weakness: Secondary | ICD-10-CM

## 2020-04-30 DIAGNOSIS — E876 Hypokalemia: Secondary | ICD-10-CM | POA: Diagnosis not present

## 2020-04-30 DIAGNOSIS — M6282 Rhabdomyolysis: Secondary | ICD-10-CM | POA: Diagnosis not present

## 2020-04-30 DIAGNOSIS — E43 Unspecified severe protein-calorie malnutrition: Secondary | ICD-10-CM | POA: Diagnosis present

## 2020-04-30 DIAGNOSIS — I493 Ventricular premature depolarization: Secondary | ICD-10-CM | POA: Diagnosis present

## 2020-04-30 DIAGNOSIS — I6381 Other cerebral infarction due to occlusion or stenosis of small artery: Secondary | ICD-10-CM | POA: Diagnosis not present

## 2020-04-30 DIAGNOSIS — IMO0002 Reserved for concepts with insufficient information to code with codable children: Secondary | ICD-10-CM

## 2020-04-30 DIAGNOSIS — N400 Enlarged prostate without lower urinary tract symptoms: Secondary | ICD-10-CM | POA: Diagnosis present

## 2020-04-30 DIAGNOSIS — S79912A Unspecified injury of left hip, initial encounter: Secondary | ICD-10-CM | POA: Diagnosis not present

## 2020-04-30 DIAGNOSIS — Z87442 Personal history of urinary calculi: Secondary | ICD-10-CM | POA: Diagnosis not present

## 2020-04-30 DIAGNOSIS — C801 Malignant (primary) neoplasm, unspecified: Secondary | ICD-10-CM | POA: Diagnosis not present

## 2020-04-30 DIAGNOSIS — E1122 Type 2 diabetes mellitus with diabetic chronic kidney disease: Secondary | ICD-10-CM | POA: Diagnosis present

## 2020-04-30 DIAGNOSIS — S0990XA Unspecified injury of head, initial encounter: Secondary | ICD-10-CM | POA: Diagnosis not present

## 2020-04-30 DIAGNOSIS — M6281 Muscle weakness (generalized): Secondary | ICD-10-CM | POA: Diagnosis not present

## 2020-04-30 DIAGNOSIS — Z515 Encounter for palliative care: Secondary | ICD-10-CM

## 2020-04-30 DIAGNOSIS — D509 Iron deficiency anemia, unspecified: Secondary | ICD-10-CM | POA: Diagnosis present

## 2020-04-30 DIAGNOSIS — I6523 Occlusion and stenosis of bilateral carotid arteries: Secondary | ICD-10-CM | POA: Diagnosis present

## 2020-04-30 DIAGNOSIS — L89151 Pressure ulcer of sacral region, stage 1: Secondary | ICD-10-CM | POA: Diagnosis present

## 2020-04-30 DIAGNOSIS — K449 Diaphragmatic hernia without obstruction or gangrene: Secondary | ICD-10-CM | POA: Diagnosis not present

## 2020-04-30 DIAGNOSIS — R1011 Right upper quadrant pain: Secondary | ICD-10-CM

## 2020-04-30 DIAGNOSIS — Z801 Family history of malignant neoplasm of trachea, bronchus and lung: Secondary | ICD-10-CM

## 2020-04-30 DIAGNOSIS — Z79899 Other long term (current) drug therapy: Secondary | ICD-10-CM

## 2020-04-30 DIAGNOSIS — S0993XA Unspecified injury of face, initial encounter: Secondary | ICD-10-CM | POA: Diagnosis not present

## 2020-04-30 LAB — BASIC METABOLIC PANEL
Anion gap: 12 (ref 5–15)
BUN: 25 mg/dL — ABNORMAL HIGH (ref 8–23)
CO2: 22 mmol/L (ref 22–32)
Calcium: 8.3 mg/dL — ABNORMAL LOW (ref 8.9–10.3)
Chloride: 104 mmol/L (ref 98–111)
Creatinine, Ser: 1.32 mg/dL — ABNORMAL HIGH (ref 0.61–1.24)
GFR calc Af Amer: 57 mL/min — ABNORMAL LOW (ref >60)
GFR calc non Af Amer: 50 mL/min — ABNORMAL LOW (ref >60)
Glucose, Bld: 98 mg/dL (ref 70–99)
Potassium: 3.4 mmol/L — ABNORMAL LOW (ref 3.5–5.1)
Sodium: 138 mmol/L (ref 135–145)

## 2020-04-30 LAB — POC OCCULT BLOOD, ED: Fecal Occult Bld: NEGATIVE

## 2020-04-30 LAB — CBC WITH DIFFERENTIAL/PLATELET
Abs Immature Granulocytes: 0.07 10*3/uL (ref 0.00–0.07)
Basophils Absolute: 0 10*3/uL (ref 0.0–0.1)
Basophils Relative: 0 %
Eosinophils Absolute: 0 10*3/uL (ref 0.0–0.5)
Eosinophils Relative: 0 %
HCT: 27.9 % — ABNORMAL LOW (ref 39.0–52.0)
Hemoglobin: 8.1 g/dL — ABNORMAL LOW (ref 13.0–17.0)
Immature Granulocytes: 1 %
Lymphocytes Relative: 14 %
Lymphs Abs: 1.4 10*3/uL (ref 0.7–4.0)
MCH: 23 pg — ABNORMAL LOW (ref 26.0–34.0)
MCHC: 29 g/dL — ABNORMAL LOW (ref 30.0–36.0)
MCV: 79.3 fL — ABNORMAL LOW (ref 80.0–100.0)
Monocytes Absolute: 1.2 10*3/uL — ABNORMAL HIGH (ref 0.1–1.0)
Monocytes Relative: 12 %
Neutro Abs: 7.5 10*3/uL (ref 1.7–7.7)
Neutrophils Relative %: 73 %
Platelets: 229 10*3/uL (ref 150–400)
RBC: 3.52 MIL/uL — ABNORMAL LOW (ref 4.22–5.81)
RDW: 17.6 % — ABNORMAL HIGH (ref 11.5–15.5)
WBC: 10.2 10*3/uL (ref 4.0–10.5)
nRBC: 0 % (ref 0.0–0.2)

## 2020-04-30 LAB — URINALYSIS, ROUTINE W REFLEX MICROSCOPIC
Bacteria, UA: NONE SEEN
Bilirubin Urine: NEGATIVE
Glucose, UA: 50 mg/dL — AB
Hgb urine dipstick: NEGATIVE
Ketones, ur: NEGATIVE mg/dL
Leukocytes,Ua: NEGATIVE
Nitrite: NEGATIVE
Protein, ur: 30 mg/dL — AB
Specific Gravity, Urine: 1.021 (ref 1.005–1.030)
pH: 5 (ref 5.0–8.0)

## 2020-04-30 LAB — CBG MONITORING, ED: Glucose-Capillary: 60 mg/dL — ABNORMAL LOW (ref 70–99)

## 2020-04-30 LAB — LACTIC ACID, PLASMA: Lactic Acid, Venous: 1.2 mmol/L (ref 0.5–1.9)

## 2020-04-30 LAB — SARS CORONAVIRUS 2 BY RT PCR (HOSPITAL ORDER, PERFORMED IN ~~LOC~~ HOSPITAL LAB): SARS Coronavirus 2: NEGATIVE

## 2020-04-30 LAB — PROTIME-INR
INR: 1.4 — ABNORMAL HIGH (ref 0.8–1.2)
Prothrombin Time: 16.3 seconds — ABNORMAL HIGH (ref 11.4–15.2)

## 2020-04-30 LAB — HEMOGLOBIN A1C
Hgb A1c MFr Bld: 7.5 % — ABNORMAL HIGH (ref 4.8–5.6)
Mean Plasma Glucose: 168.55 mg/dL

## 2020-04-30 LAB — APTT: aPTT: 37 seconds — ABNORMAL HIGH (ref 24–36)

## 2020-04-30 LAB — CK: Total CK: 981 U/L — ABNORMAL HIGH (ref 49–397)

## 2020-04-30 LAB — TROPONIN I (HIGH SENSITIVITY): Troponin I (High Sensitivity): 19 ng/L — ABNORMAL HIGH (ref <18)

## 2020-04-30 MED ORDER — SODIUM CHLORIDE 0.9 % IV BOLUS
500.0000 mL | Freq: Once | INTRAVENOUS | Status: AC
  Administered 2020-04-30: 500 mL via INTRAVENOUS

## 2020-04-30 MED ORDER — ENSURE ENLIVE PO LIQD
237.0000 mL | Freq: Two times a day (BID) | ORAL | Status: DC
  Administered 2020-05-01 – 2020-05-02 (×2): 237 mL via ORAL

## 2020-04-30 MED ORDER — TRAMADOL HCL 50 MG PO TABS
50.0000 mg | ORAL_TABLET | Freq: Four times a day (QID) | ORAL | Status: DC | PRN
  Administered 2020-05-01 – 2020-05-12 (×18): 50 mg via ORAL
  Filled 2020-04-30 (×17): qty 1

## 2020-04-30 MED ORDER — SODIUM CHLORIDE 0.9 % IV SOLN: INTRAVENOUS | Status: DC

## 2020-04-30 MED ORDER — ONDANSETRON HCL 4 MG PO TABS: 4.0000 mg | ORAL_TABLET | Freq: Four times a day (QID) | ORAL | Status: DC | PRN

## 2020-04-30 MED ORDER — TRAMADOL HCL 50 MG PO TABS
100.0000 mg | ORAL_TABLET | Freq: Four times a day (QID) | ORAL | Status: DC | PRN
  Administered 2020-04-30 – 2020-05-11 (×9): 100 mg via ORAL
  Filled 2020-04-30 (×11): qty 2

## 2020-04-30 MED ORDER — ONDANSETRON HCL 4 MG/2ML IJ SOLN
4.0000 mg | Freq: Four times a day (QID) | INTRAMUSCULAR | Status: DC | PRN
  Administered 2020-05-09: 4 mg via INTRAVENOUS
  Filled 2020-04-30: qty 2

## 2020-04-30 MED ORDER — POLYETHYLENE GLYCOL 3350 17 G PO PACK: 17.0000 g | PACK | Freq: Every day | ORAL | Status: DC | PRN

## 2020-04-30 MED ORDER — ACETAMINOPHEN 325 MG PO TABS
650.0000 mg | ORAL_TABLET | Freq: Four times a day (QID) | ORAL | Status: DC | PRN
  Administered 2020-05-11: 650 mg via ORAL
  Filled 2020-04-30 (×2): qty 2

## 2020-04-30 MED ORDER — METOPROLOL SUCCINATE ER 25 MG PO TB24
25.0000 mg | ORAL_TABLET | Freq: Every day | ORAL | Status: DC
  Administered 2020-04-30 – 2020-05-02 (×3): 25 mg via ORAL
  Filled 2020-04-30 (×3): qty 1

## 2020-04-30 MED ORDER — RAMIPRIL 2.5 MG PO CAPS
10.0000 mg | ORAL_CAPSULE | Freq: Every day | ORAL | Status: DC
  Administered 2020-05-01: 10 mg via ORAL
  Filled 2020-04-30: qty 4

## 2020-04-30 MED ORDER — ACETAMINOPHEN 650 MG RE SUPP
650.0000 mg | Freq: Four times a day (QID) | RECTAL | Status: DC | PRN
  Administered 2020-05-05: 650 mg via RECTAL
  Filled 2020-04-30: qty 1

## 2020-04-30 MED ORDER — ATORVASTATIN CALCIUM 10 MG PO TABS
20.0000 mg | ORAL_TABLET | Freq: Every day | ORAL | Status: DC
  Administered 2020-05-01 – 2020-05-12 (×11): 20 mg via ORAL
  Filled 2020-04-30 (×11): qty 2

## 2020-04-30 MED ORDER — TAMSULOSIN HCL 0.4 MG PO CAPS
0.4000 mg | ORAL_CAPSULE | Freq: Every day | ORAL | Status: DC
  Administered 2020-05-01 – 2020-05-02 (×2): 0.4 mg via ORAL
  Filled 2020-04-30 (×2): qty 1

## 2020-04-30 MED ORDER — NITROGLYCERIN 0.4 MG SL SUBL
0.4000 mg | SUBLINGUAL_TABLET | SUBLINGUAL | Status: DC | PRN
  Filled 2020-04-30: qty 1

## 2020-04-30 MED ORDER — ENZALUTAMIDE 40 MG PO CAPS: 80.0000 mg | ORAL_CAPSULE | Freq: Every day | ORAL | Status: DC

## 2020-04-30 MED ORDER — INSULIN ASPART 100 UNIT/ML ~~LOC~~ SOLN
0.0000 [IU] | Freq: Three times a day (TID) | SUBCUTANEOUS | Status: DC
  Administered 2020-05-01: 5 [IU] via SUBCUTANEOUS
  Administered 2020-05-01: 2 [IU] via SUBCUTANEOUS
  Administered 2020-05-01: 3 [IU] via SUBCUTANEOUS
  Administered 2020-05-02: 2 [IU] via SUBCUTANEOUS
  Administered 2020-05-03: 5 [IU] via SUBCUTANEOUS
  Administered 2020-05-04: 2 [IU] via SUBCUTANEOUS
  Administered 2020-05-04: 5 [IU] via SUBCUTANEOUS
  Administered 2020-05-04: 3 [IU] via SUBCUTANEOUS
  Administered 2020-05-05 (×2): 2 [IU] via SUBCUTANEOUS
  Administered 2020-05-05: 3 [IU] via SUBCUTANEOUS
  Administered 2020-05-05: 2 [IU] via SUBCUTANEOUS

## 2020-04-30 NOTE — ED Triage Notes (Signed)
Patient fell while walking to bathroom this am. Patient denies loc and did not hit head. Complains of bilateral leg weakness. Patient complains of pelvis pain and left eldow skin tear noted.

## 2020-04-30 NOTE — ED Notes (Signed)
Pt is aware we need a urine sample.

## 2020-04-30 NOTE — ED Notes (Signed)
Pt reports "doesn't feel good".  This nurse checked BS, results 60.  Gave sprite.  Pt reports feeling "little better".  Reports he hasn't had anything to eat or drink since yesterday.  This nurse called Tomi Bamberger on 6E to report BS.

## 2020-04-30 NOTE — H&P (Signed)
History and Physical    Adrian Singh OZH:[id number] DOB: Apr 24, 1934 DOA: 04/30/2020  PCP: [name], MD  Patient coming from: Home   Chief Complaint:    Fall, weakness  HPI:    86 year old male with past medical history of paroxysmal atrial fibrillation (on Xarelto), coronary disease (s/p PCI with stent to circumflex), diastolic congestive heart failure (last echo 02/2016 EF 45-50%), diabetes mellitus type 2, hypertension, gastroesophageal reflux disease, prostate cancer, hyperlipidemia who presents to [facility] emergency department status post fall.  History is been obtained from both patient and the daughter who is at the bedside.  Patient explains that for the past 2 months he has been experiencing progressively worsening weakness.  Weakness is generalized but has been gradually become more and more severe over the span of time.  Patient also complains of some occasional intermittent left-sided chest discomfort although he states that this is not uncommon considering his history of coronary artery disease.  Patient denies dyspnea on exertion.  Patient does also endorse poor appetite over the same span of time with a 15 pound weight loss.  Patient is also complaining of a productive cough with clear sputum over the span of time but denies fevers, recent travel, sick contacts or confirmed contact with known COVID-19 infection.  Of note, patient has undergone COVID-19 vaccination with shots completed in early June and early July.  Patient's weakness continue to progress resulting in him falling in his backyard several weeks ago.  Yesterday evening, as the patient was attempting to get up off the toilet after using the bathroom he relates did not have the strength to completely rise from a seated position and fell to the floor.  The patient was too weak to get up, even with his wife attempting to help him throughout the evening.  The patient remained lying on the floor for  essentially the entire evening until the patient's daughter came to assist him the following morning.  Patient denies any loss of consciousness or head trauma with this fall.  According to the daughter who is at the bedside, she also noticed that the patient was having difficulty moving his left side shortly after arriving to help him.  She also endorses new onset slurring of his words since she found him this morning.  Upon evaluation in the emergency department, patient is complaining of moderate to severe sore musculoskeletal pain of the left chest wall, bilateral hips and legs.  X-rays of all of these areas is negative for fracture.  500 cc bolus was provided of isotonic fluids.  Due to the patient's progressive weakness and fall the hospitalist group has been called to assess patient for admission to the hospital.    Review of Systems:  Review of Systems  Constitutional: Positive for weight loss.  Respiratory: Positive for cough and sputum production.   Musculoskeletal: Positive for back pain, falls, joint pain and myalgias.  Neurological: Positive for weakness.  All other systems reviewed and are negative.     Past Medical History:  Diagnosis Date  . Arthritis    "legs, back" (10/06/2015)  . Atrial fibrillation ([place])    x1  . CAD (coronary artery disease)    nonobstructive  . Cancer ([place])    skin cancer on ear (froze it off) and back (cut it off)  . Dysrhythmia   . Esophageal reflux    hx of  . History of kidney stones   . Hx of heart artery stent   .  Hypertrophy of prostate without urinary obstruction and other lower urinary tract symptoms (LUTS)   . Osteoarthrosis, unspecified whether generalized or localized, unspecified site   . Prostate cancer ([place])   . Skin cancer   . Stroke [facility]) 03/2014   "had a series of mini strokes; maybe 4"; denies residual on 10/06/2015  . Thrombocytopenia ([place])   . Type II diabetes mellitus ([place])    type 2  . Unspecified essential  hypertension     Past Surgical History:  Procedure Laterality Date  . BACK SURGERY     2001  . CARDIAC CATHETERIZATION  12/2007  . CARDIAC CATHETERIZATION N/A 09/22/2015   Procedure: Left Heart Cath and Coronary Angiography;  Surgeon: Peter M Martinique, MD;  Location: [place] [facility];  Service: Cardiovascular;  Laterality: N/A;  . CARDIAC CATHETERIZATION N/A 10/06/2015   Procedure: Coronary Stent Intervention;  Surgeon: Peter M Martinique, MD;  Location: [place] [facility];  Service: Cardiovascular;  Laterality: N/A;  . CARDIAC CATHETERIZATION N/A 02/29/2016   Procedure: Left Heart Cath and Coronary Angiography;  Surgeon: Jettie Booze, MD;  Location: [place] [facility];  Service: Cardiovascular;  Laterality: N/A;  . COLONOSCOPY W/ BIOPSIES AND POLYPECTOMY    . CORONARY STENT PLACEMENT  10/06/2015   [name]  with DES  . EAR CYST EXCISION N/A 04/06/2015   Procedure: EXCISION OF SCALP CYST;  Surgeon: Donnie Mesa, MD;  Location: [place];  Service: General;  Laterality: N/A;  . ESOPHAGOGASTRODUODENOSCOPY (EGD) WITH ESOPHAGEAL DILATION  2001   with dilation  . LUMBAR DISC SURGERY  09/26/1999   "cleaned out arthritis and bone spurs"  . SHOULDER ARTHROSCOPY W/ ROTATOR CUFF REPAIR Left 2005  . THULIUM LASER TURP (TRANSURETHRAL RESECTION OF PROSTATE) N/A 01/21/2017   Procedure: Marcelino Duster LASER TURP (TRANSURETHRAL RESECTION OF PROSTATE) CAUTERIZATION OF BLADDER LESION;  Surgeon: Franchot Gallo, MD;  Location: [facility];  Service: Urology;  Laterality: N/A;     reports that he has quit smoking. His smoking use included cigarettes. He quit after 3.00 years of use. He has never used smokeless tobacco. He reports that he does not drink alcohol and does not use drugs.  Allergies  Allergen Reactions  . Soma [Carisoprodol] Other (See Comments)    "did a number on me"  . Doxazosin Other (See Comments)    dizziness    Family History  Problem Relation Age of Onset  . Stroke Father   . Peripheral vascular  disease Father        amputation  . Heart failure Mother        CHF  . Coronary artery disease Mother   . Heart attack Mother 9       Multiple  . COPD Brother   . Heart disease Brother   . Cancer Brother        Bone  . Lung cancer Sister      Prior to Admission medications   Medication Sig Start Date End Date Taking? Authorizing Provider  acetaminophen (TYLENOL) 650 MG CR tablet Take 650 mg by mouth 2 (two) times daily as needed for pain.   Yes [provider]  Ascorbic Acid (VITAMIN C) 1000 MG tablet Take 1,000 mg by mouth daily.   Yes [provider]  atorvastatin (LIPITOR) 40 MG tablet Take 1/2 (one-half) tablet by mouth once daily Patient taking differently: Take 20 mg by mouth daily.  06/08/19  Yes Viviana Simpler I, MD  b complex vitamins capsule Take 1 capsule by mouth daily.  Yes [provider]  calcium carbonate (CALCIUM 600) 1500 (600 Ca) MG TABS tablet Take 600 mg of elemental calcium by mouth daily with breakfast.    Yes [provider]  Cholecalciferol (VITAMIN D3) 1.25 MG (50000 UT) CAPS Take 50,000 Units by mouth once a week. Saturday   Yes [provider]  cyanocobalamin 1000 MCG tablet Take 1,000 mg by mouth at bedtime.    Yes [provider]  docusate sodium (COLACE) 250 MG capsule Take 250 mg by mouth daily as needed for constipation.    Yes [provider]  furosemide (LASIX) 40 MG tablet Take 1 tablet (40 mg total) by mouth daily as needed. 12/04/18  Yes [name], MD  glimepiride (AMARYL) 4 MG tablet Take 1 tablet by mouth twice daily Patient taking differently: Take 4 mg by mouth in the morning and at bedtime.  06/03/19  Yes [name], MD  metFORMIN (GLUCOPHAGE) 500 MG tablet Take 1 tablet (500 mg total) by mouth daily with breakfast. 07/20/19  Yes Martinique, Peter M, MD  metoprolol succinate (TOPROL-XL) 100 MG 24 hr tablet Take 50 mg by mouth at bedtime. Take with or immediately following  a meal.   Yes [provider]  Multiple Vitamin (MULTIVITAMIN WITH MINERALS) TABS tablet Take 1 tablet by mouth daily. ONE-A-DAY FOR MEN   Yes [provider]  nitroGLYCERIN (NITROSTAT) 0.4 MG SL tablet Place 1 tablet (0.4 mg total) under the tongue every 5 (five) minutes as needed for chest pain. 05/15/18  Yes [name], MD  OVER THE COUNTER MEDICATION Take 15 mLs by mouth in the morning and at bedtime. Bio-Cell   Yes [provider]  ramipril (ALTACE) 10 MG capsule Take 1 capsule by mouth once daily Patient taking differently: Take 10 mg by mouth daily.  03/04/20  Yes Martinique, Peter M, MD  rivaroxaban (XARELTO) 20 MG TABS tablet Take 1 tablet (20 mg total) by mouth daily with supper. 01/21/20  Yes Martinique, Peter M, MD  tamsulosin (FLOMAX) 0.4 MG CAPS capsule Take 1 capsule (0.4 mg total) by mouth daily. Patient taking differently: Take 0.4 mg by mouth daily after supper.  06/25/19  Yes [name], MD  traMADol (ULTRAM) 50 MG tablet TAKE 1 TABLET BY MOUTH THREE TIMES DAILY AS NEEDED Patient taking differently: Take 50 mg by mouth 2 (two) times daily as needed for moderate pain.  08/07/18  Yes [name], MD  XTANDI 40 MG capsule Take 80 mg by mouth daily.  04/14/20  Yes [provider]  BAYER MICROLET LANCETS lancets Use to test blood sugar once daily dx: E11.40 10/18/15   [name], MD  Blood Glucose Monitoring Suppl (CONTOUR NEXT EZ MONITOR) w/Device KIT USE TO TEST BLOOD SUGAR ONCE DAILY. Dx Code E11.40 06/13/16   Viviana Simpler I, MD  glucose blood (CONTOUR NEXT TEST) test strip Check blood sugar once daily and as directed.Dx Code E11.40 03/19/19   [name], MD  metoprolol succinate (TOPROL-XL) 25 MG 24 hr tablet Take 1 tablet (25 mg total) by mouth daily. Patient taking differently: Take 25 mg by mouth at bedtime.  04/06/19   [name], MD    Physical Exam: Vitals:   04/30/20 0853 04/30/20 1130 04/30/20 1219 04/30/20  1300  BP: 126/64 100/64 (!) 147/57 (!) 141/74  Pulse: 84 79 79 75  Resp: _0 Temp: 97.8 F (36.6 C)  97.6 F (36.4 C)  TempSrc: Oral  Oral   SpO2: 99% 99% 92% 100%    Constitutional: Acute alert and oriented x3, no associated distress.   Skin: no rashes, no lesions, notable poor skin turgor.  Notable stage I pressure injury of the sacrum present on admission.  Patient additionally has multiple ecchymoses over the bilateral upper extremities. Eyes: Pupils are equally reactive to light.  Increased conjunctival pallor without scleral icterus. ENMT: Notably dry mucous membranes noted.  Posterior pharynx clear of any exudate or lesions.   Neck: normal, supple, no masses, no thyromegaly.  No evidence of jugular venous distension.   Respiratory: clear to auscultation bilaterally, no wheezing, no crackles. Normal respiratory effort. No accessory muscle use.  Cardiovascular: Regular rate and rhythm, no murmurs / rubs / gallops. No extremity edema. 2+ pedal pulses. No carotid bruits.  Chest:   Nontender without crepitus or deformity.   Back:   Notable tenderness of the lower back.  No evidence of deformity or crepitus. Abdomen: Abdomen is soft and nontender.  No evidence of intra-abdominal masses.  Positive bowel sounds noted in all quadrants.   Musculoskeletal: Notable diffuse tenderness of the bilateral thighs.  No joint deformity upper and lower extremities. Good ROM, no contractures. Normal muscle tone.  Neurologic: Patient is lethargic but arousable and oriented x3.  Patient is exhibiting slurred speech.  CN 2-12 grossly intact.  Patient endorses decreased sensation of the left side of his body.  Patient exhibits 2 out of 5 strength in the bilateral lower extremities in both proximal and distal muscle groups.  Patient is following all commands.  Patient is responsive to verbal stimuli.   Psychiatric: Patient presents as a normal mood with appropriate affect.  Patient seems to possess  insight as to theircurrent situation.     Labs on Admission: I have personally reviewed following labs and imaging studies -   CBC: Recent Labs  Lab 04/29/20 1218 04/30/20 1637  WBC 6.6 10.2  NEUTROABS  --  7.5  HGB 8.5 Repeated and verified X2.* 8.1*  HCT 26.7 Repeated and verified X2.* 27.9*  MCV 75.0* 79.3*  PLT 224.0 623   Basic Metabolic Panel: Recent Labs  Lab 04/29/20 1218 04/30/20 1637  NA 136 138  K 4.7 3.4*  CL 101 104  CO2 25 22  GLUCOSE 245* 98  BUN 20 25*  CREATININE 1.07 1.32*  CALCIUM 8.2* 8.3*   GFR: Estimated Creatinine Clearance: 41 mL/min (A) (by C-G formula based on SCr of 1.32 mg/dL (H)). Liver Function Tests: Recent Labs  Lab 04/29/20 1218  AST 21  ALT 7  ALKPHOS 183*  BILITOT 0.5  PROT 6.5  ALBUMIN 2.9*   No results for input(s): LIPASE, AMYLASE in the last 168 hours. No results for input(s): AMMONIA in the last 168 hours. Coagulation Profile: Recent Labs  Lab 04/30/20 1930  INR 1.4*   Cardiac Enzymes: Recent Labs  Lab 04/30/20 1637  CKTOTAL 981*   BNP (last 3 results) No results for input(s): PROBNP in the last 8760 hours. HbA1C: Recent Labs    04/29/20 1218 04/30/20 1637  HGBA1C 7.9* 7.5*   CBG: No results for input(s): GLUCAP in the last 168 hours. Lipid Profile: No results for input(s): CHOL, HDL, LDLCALC, TRIG, CHOLHDL, LDLDIRECT in the last 72 hours. Thyroid Function Tests: Recent Labs    04/29/20 1218  FREET4 0.98   Anemia Panel: Recent Labs    04/29/20 1218  VITAMINB12 >1526*   Urine analysis:    Component Value Date/Time  COLORURINE AMBER (A) 04/30/2020 1930   APPEARANCEUR HAZY (A) 04/30/2020 1930   LABSPEC 1.021 04/30/2020 1930   PHURINE 5.0 04/30/2020 1930   GLUCOSEU 50 (A) 04/30/2020 1930   HGBUR NEGATIVE 04/30/2020 1930   BILIRUBINUR NEGATIVE 04/30/2020 1930   BILIRUBINUR negative 03/18/2014 [place] 04/30/2020 1930   PROTEINUR 30 (A) 04/30/2020 1930   UROBILINOGEN 1.0  03/31/2014 1228   NITRITE NEGATIVE 04/30/2020 1930   LEUKOCYTESUR NEGATIVE 04/30/2020 1930    Radiological Exams on Admission - Personally Reviewed: DG Ribs Unilateral W/Chest Right  Result Date: 04/30/2020 CLINICAL DATA:  Fall EXAM: RIGHT RIBS AND CHEST - 3+ VIEW COMPARISON:  Chest radiograph dated 02/28/2016 FINDINGS: No fracture or other bone lesions are seen involving the ribs. There is no evidence of pneumothorax or pleural effusion. Both lungs are clear. Heart size and mediastinal contours are within normal limits. IMPRESSION: Negative. Electronically Signed   By: Zerita Boers M.D.   On: 04/30/2020 18:14   DG Lumbar Spine Complete  Result Date: 04/30/2020 CLINICAL DATA:  Fall with back pain EXAM: LUMBAR SPINE - COMPLETE 4+ VIEW COMPARISON:  None. FINDINGS: There is no evidence of lumbar spine fracture. There is lumbar dextrocurvature. Moderate to severe degenerative changes are seen in the lumbar spine. IMPRESSION: Negative for acute fracture. Electronically Signed   By: Zerita Boers M.D.   On: 04/30/2020 18:16   DG Shoulder Left  Result Date: 04/30/2020 CLINICAL DATA:  Fall with left shoulder pain EXAM: LEFT SHOULDER - 2+ VIEW COMPARISON:  Left shoulder radiographs dated 02/18/2004. FINDINGS: There is no evidence of fracture or dislocation. Degenerative changes are seen in the acromioclavicular joint. Soft tissues are unremarkable. IMPRESSION: Negative. Electronically Signed   By: Zerita Boers M.D.   On: 04/30/2020 18:18   DG Hip Unilat With Pelvis 2-3 Views Left  Result Date: 04/30/2020 CLINICAL DATA:  Patient status post fall.  Leg weakness. EXAM: DG HIP (WITH OR WITHOUT PELVIS) 2-3V LEFT COMPARISON:  None. FINDINGS: Lower lumbar spine degenerative changes. SI joints unremarkable. Pelvic phleboliths. Mild bilateral hip joint degenerative changes. No evidence for acute fracture. IMPRESSION: No acute osseous abnormality. Electronically Signed   By: Lovey Newcomer M.D.   On: 04/30/2020  10:35   DG Hip Unilat W or Wo Pelvis 2-3 Views Right  Result Date: 04/30/2020 CLINICAL DATA:  Fall with hip pain EXAM: DG HIP (WITH OR WITHOUT PELVIS) 2-3V RIGHT COMPARISON:  None. FINDINGS: There is no evidence of hip fracture or dislocation. Degenerative changes are seen in the spine and hips. IMPRESSION: No acute findings. Electronically Signed   By: Zerita Boers M.D.   On: 04/30/2020 18:19    EKG: Personally reviewed.  Rhythm is normal sinus rhythm with heart rate of 81 bpm.  No dynamic ST segment changes appreciated.  Assessment/Plan Principal Problem:   Fall at home, initial encounter   Fall likely secondary to progressive generalized weakness that may be multifactorial in etiology  Patient is clinically volume depleted due to poor oral intake over the last several weeks which likely contributed.    Additionally, the daughter reports left-sided weakness this morning with slurred speech.  Obtaining urinalysis, chest x-ray to evaluate for any potential sources of infection.  Obtaining head imaging to rule out intracranial hemorrhage considering patient is on Xarelto in addition to ruling out any evidence of new onset ischemic stroke considering patient's history of cardioembolic stroke in the past and history provided by daughter of left-sided weakness and slurred speech.  Case discussed with Dr. Cheral Marker with neurology who recommended formally consulting him if the MRI comes back with an acute stroke considering the patient's multifactorial presentation.  Additionally, patient seems to have a downtrending hemoglobin and hematocrit which could suggest an underlying bleed that could also be contributing to the patient's weakness and fall.  Patient reports a several week history of dark but not melanotic stool.  Performing stool Hemoccult in the emergency department, obtaining serial CBCs, holding Xarelto.  500 cc normal saline bolus given in the emergency department, hydrating patient  with intravenous isotonic fluids  PT evaluation in the morning  Monitoring patient on telemetry  Active Problems:   Generalized weakness   Please see assessment and plan above    Essential hypertension   Continue home regimen of oral antihypertensive therapy while monitoring for episodic hypotension     Uncontrolled type 2 diabetes mellitus with diabetic polyneuropathy, without long-term current use of insulin (HCC)   Accu-Cheks before every meal and nightly with sliding scale insulin  Holding home regimen of oral hypoglycemics    PAF (paroxysmal atrial fibrillation) (HCC)   Continue home regimen of AV nodal blocking therapy  Patient is seemingly currently rate controlled  Monitoring patient on telemetry  Elevated troponin not due to myocardial infarction   Slightly elevated troponin without notable EKG changes and atypical reproducible chest discomfort  Patient is unlikely to be suffering from plaque rupture  Chest discomfort likely secondary to recent fall  Cycling cardiac enzymes  Monitoring patient on telemetry  Acute on chronic anemia   Holding Xarelto  Cycling cardiac enzymes  Performing stool Hemoccult  See assessment and plan above    Elevated CK   Slightly elevated creatinine kinase  Hydrate patient with intravenous isotonic fluids to avoid rhabdomyolysis  Performing serial creatine kinase levels to ensure downtrending and resolution.    Chronic kidney disease, stage 3a   Creatinine is essentially at baseline  Strict input and output monitoring  Avoidance of nephrotoxic agents  Monitoring renal function and electrolytes with serial chemistries.    Primary malignant neoplasm of prostate metastatic to bone [facility])  Outpatient follow-up    Code Status:  Full code Family Communication: Daughter is at bedside and has been updated on plan of care  Status is: Observation  The patient remains OBS appropriate and will d/c before 2  midnights.  Dispo: The patient is from: Home              Anticipated d/c is to: SNF              Anticipated d/c date is: 2 days              Patient currently is not medically stable to d/c.        Vernelle Emerald MD [facility] Pager [phone]  If 7PM-7AM, please contact night-coverage [email or web address] Use universal [place] password for that web site. If you do not have the password, please call the hospital operator.  04/30/2020, 8:48 PM

## 2020-04-30 NOTE — ED Provider Notes (Signed)
[facility] EMERGENCY DEPARTMENT Provider Note   CSN: [id number] Arrival date & time: 04/30/20  0847     History No chief complaint on file.   Adrian Singh is a 86 y.o. male.  Patient brought in by daughter.  Patient had a fall at 10 PM last night.  In the bathroom.  And was unable to get up.  Patient said he did not pass out did not hit his head did not have any loss of consciousness.  But has been having trouble with sort of generalized weakness in his legs got wobbly and he kind of collapsed on himself.  Patient on the blood thinner Xarelto for atrial fibrillation.  Patient with a lot of bruising to the left arm.  Patient laid on the bathroom floor until 6 this morning.  According to daughter patient still very weak.  Has been weak for the past few days.        Past Medical History:  Diagnosis Date  . Arthritis    "legs, back" (10/06/2015)  . Atrial fibrillation ([place])    x1  . CAD (coronary artery disease)    nonobstructive  . Cancer ([place])    skin cancer on ear (froze it off) and back (cut it off)  . Dysrhythmia   . Esophageal reflux    hx of  . History of kidney stones   . Hx of heart artery stent   . Hypertrophy of prostate without urinary obstruction and other lower urinary tract symptoms (LUTS)   . Osteoarthrosis, unspecified whether generalized or localized, unspecified site   . Prostate cancer ([place])   . Skin cancer   . Stroke [facility]) 03/2014   "had a series of mini strokes; maybe 4"; denies residual on 10/06/2015  . Thrombocytopenia ([place])   . Type II diabetes mellitus ([place])    type 2  . Unspecified essential hypertension     Patient Active Problem List   Diagnosis Date Noted  . Generalized weakness 04/30/2020  . Fall at home, initial encounter 04/30/2020  . Acute on chronic anemia 04/30/2020  . Elevated CK 04/30/2020  . Chronic kidney disease, stage 3a 04/30/2020  . Fatigue 04/29/2020  . Malnutrition of mild degree ([place]) 04/06/2019  .  Diabetic foot ulcer ([place]) 04/06/2019  . Mood disorder ([place]) 04/06/2019  . Aortic atherosclerosis ([place]) 02/13/2017  . Prostate cancer ([place]) 02/13/2017  . Primary malignant neoplasm of prostate metastatic to bone ([place]) 02/13/2017  . Orthostatic hypotension 03/19/2016  . CAD S/P percutaneous coronary angioplasty 02/28/2016  . Atherosclerosis of native coronary artery of native heart with angina pectoris ([place])   . Abnormal nuclear stress test 09/22/2015  . Thrombocytopenia ([place])   . Advanced directives, counseling/discussion 09/27/2014  . PAF (paroxysmal atrial fibrillation) ([place]) 04/12/2014  . History of CVA (cerebrovascular accident) 03/31/2014  . CKD stage 3 due to type 2 diabetes mellitus ([place])   . Uncontrolled type 2 diabetes mellitus with diabetic polyneuropathy, without long-term current use of insulin ([place]) 11/05/2011  . Routine general medical examination at a health care facility 05/07/2011  . Essential hypertension 12/23/2009  . GERD 12/23/2009  . BPH (benign prostatic hyperplasia) 12/23/2009  . OSTEOARTHRITIS 12/23/2009    Past Surgical History:  Procedure Laterality Date  . BACK SURGERY     2001  . CARDIAC CATHETERIZATION  12/2007  . CARDIAC CATHETERIZATION N/A 09/22/2015   Procedure: Left Heart Cath and Coronary Angiography;  Surgeon: Peter M Martinique, MD;  Location: [place] [facility];  Service:  Cardiovascular;  Laterality: N/A;  . CARDIAC CATHETERIZATION N/A 10/06/2015   Procedure: Coronary Stent Intervention;  Surgeon: Peter M Martinique, MD;  Location: [place] [facility];  Service: Cardiovascular;  Laterality: N/A;  . CARDIAC CATHETERIZATION N/A 02/29/2016   Procedure: Left Heart Cath and Coronary Angiography;  Surgeon: Jettie Booze, MD;  Location: [place] [facility];  Service: Cardiovascular;  Laterality: N/A;  . COLONOSCOPY W/ BIOPSIES AND POLYPECTOMY    . CORONARY STENT PLACEMENT  10/06/2015   [name]  with DES  . EAR CYST EXCISION N/A 04/06/2015   Procedure: EXCISION OF  SCALP CYST;  Surgeon: Donnie Mesa, MD;  Location: [place];  Service: General;  Laterality: N/A;  . ESOPHAGOGASTRODUODENOSCOPY (EGD) WITH ESOPHAGEAL DILATION  2001   with dilation  . LUMBAR DISC SURGERY  09/26/1999   "cleaned out arthritis and bone spurs"  . SHOULDER ARTHROSCOPY W/ ROTATOR CUFF REPAIR Left 2005  . THULIUM LASER TURP (TRANSURETHRAL RESECTION OF PROSTATE) N/A 01/21/2017   Procedure: Marcelino Duster LASER TURP (TRANSURETHRAL RESECTION OF PROSTATE) CAUTERIZATION OF BLADDER LESION;  Surgeon: Franchot Gallo, MD;  Location: [facility];  Service: Urology;  Laterality: N/A;       Family History  Problem Relation Age of Onset  . Stroke Father   . Peripheral vascular disease Father        amputation  . Heart failure Mother        CHF  . Coronary artery disease Mother   . Heart attack Mother 73       Multiple  . COPD Brother   . Heart disease Brother   . Cancer Brother        Bone  . Lung cancer Sister     Social History   Tobacco Use  . Smoking status: Former Smoker    Years: 3.00    Types: Cigarettes  . Smokeless tobacco: Never Used  . Tobacco comment: " Quit smoking by age 63; was a someday smoker "  Vaping Use  . Vaping Use: Never used  Substance Use Topics  . Alcohol use: No  . Drug use: No    Home Medications Prior to Admission medications   Medication Sig Start Date End Date Taking? Authorizing Provider  acetaminophen (TYLENOL) 650 MG CR tablet Take 650 mg by mouth 2 (two) times daily as needed for pain.   Yes [provider]  Ascorbic Acid (VITAMIN C) 1000 MG tablet Take 1,000 mg by mouth daily.   Yes [provider]  atorvastatin (LIPITOR) 40 MG tablet Take 1/2 (one-half) tablet by mouth once daily Patient taking differently: Take 20 mg by mouth daily.  06/08/19  Yes Viviana Simpler I, MD  b complex vitamins capsule Take 1 capsule by mouth daily.   Yes [provider]  calcium carbonate (CALCIUM 600) 1500 (600 Ca) MG TABS tablet Take 600  mg of elemental calcium by mouth daily with breakfast.    Yes [provider]  Cholecalciferol (VITAMIN D3) 1.25 MG (50000 UT) CAPS Take 50,000 Units by mouth once a week. Saturday   Yes [provider]  cyanocobalamin 1000 MCG tablet Take 1,000 mg by mouth at bedtime.    Yes [provider]  docusate sodium (COLACE) 250 MG capsule Take 250 mg by mouth daily as needed for constipation.    Yes [provider]  furosemide (LASIX) 40 MG tablet Take 1 tablet (40 mg total) by mouth daily as needed. 12/04/18  Yes [name], MD  glimepiride (AMARYL) 4  MG tablet Take 1 tablet by mouth twice daily Patient taking differently: Take 4 mg by mouth in the morning and at bedtime.  06/03/19  Yes [name], MD  metFORMIN (GLUCOPHAGE) 500 MG tablet Take 1 tablet (500 mg total) by mouth daily with breakfast. 07/20/19  Yes Martinique, Peter M, MD  metoprolol succinate (TOPROL-XL) 100 MG 24 hr tablet Take 50 mg by mouth at bedtime. Take with or immediately following a meal.   Yes [provider]  Multiple Vitamin (MULTIVITAMIN WITH MINERALS) TABS tablet Take 1 tablet by mouth daily. ONE-A-DAY FOR MEN   Yes [provider]  nitroGLYCERIN (NITROSTAT) 0.4 MG SL tablet Place 1 tablet (0.4 mg total) under the tongue every 5 (five) minutes as needed for chest pain. 05/15/18  Yes [name], MD  OVER THE COUNTER MEDICATION Take 15 mLs by mouth in the morning and at bedtime. Bio-Cell   Yes [provider]  ramipril (ALTACE) 10 MG capsule Take 1 capsule by mouth once daily Patient taking differently: Take 10 mg by mouth daily.  03/04/20  Yes Martinique, Peter M, MD  rivaroxaban (XARELTO) 20 MG TABS tablet Take 1 tablet (20 mg total) by mouth daily with supper. 01/21/20  Yes Martinique, Peter M, MD  tamsulosin (FLOMAX) 0.4 MG CAPS capsule Take 1 capsule (0.4 mg total) by mouth daily. Patient taking differently: Take 0.4 mg by mouth daily after supper.  06/25/19  Yes  [name], MD  traMADol (ULTRAM) 50 MG tablet TAKE 1 TABLET BY MOUTH THREE TIMES DAILY AS NEEDED Patient taking differently: Take 50 mg by mouth 2 (two) times daily as needed for moderate pain.  08/07/18  Yes [name], MD  XTANDI 40 MG capsule Take 80 mg by mouth daily.  04/14/20  Yes [provider]  BAYER MICROLET LANCETS lancets Use to test blood sugar once daily dx: E11.40 10/18/15   [name], MD  Blood Glucose Monitoring Suppl (CONTOUR NEXT EZ MONITOR) w/Device KIT USE TO TEST BLOOD SUGAR ONCE DAILY. Dx Code E11.40 06/13/16   Viviana Simpler I, MD  glucose blood (CONTOUR NEXT TEST) test strip Check blood sugar once daily and as directed.Dx Code E11.40 03/19/19   [name], MD  metoprolol succinate (TOPROL-XL) 25 MG 24 hr tablet Take 1 tablet (25 mg total) by mouth daily. Patient taking differently: Take 25 mg by mouth at bedtime.  04/06/19   [name], MD    Allergies    Soma [carisoprodol] and Doxazosin  Review of Systems   Review of Systems  Constitutional: Positive for fatigue. Negative for chills and fever.  HENT: Negative for congestion, rhinorrhea and sore throat.   Eyes: Negative for visual disturbance.  Respiratory: Negative for cough and shortness of breath.   Cardiovascular: Negative for chest pain and leg swelling.  Gastrointestinal: Negative for abdominal pain, diarrhea, nausea and vomiting.  Genitourinary: Negative for dysuria.  Musculoskeletal: Positive for back pain. Negative for neck pain.  Skin: Negative for rash.  Neurological: Positive for weakness. Negative for dizziness, light-headedness and headaches.  Hematological: Bruises/bleeds easily.  Psychiatric/Behavioral: Negative for confusion.    Physical Exam Updated Vital Signs BP (!) 141/74   Pulse 75   Temp 97.6 F (36.4 C) (Oral)   Resp 16   SpO2 100%   Physical Exam Vitals and nursing note reviewed.  Constitutional:      Appearance: Normal appearance. He  is well-developed.  HENT:     Head: Normocephalic and  atraumatic.  Eyes:     Extraocular Movements: Extraocular movements intact.     Conjunctiva/sclera: Conjunctivae normal.     Pupils: Pupils are equal, round, and reactive to light.  Cardiovascular:     Rate and Rhythm: Normal rate and regular rhythm.     Heart sounds: No murmur heard.   Pulmonary:     Effort: Pulmonary effort is normal. No respiratory distress.     Breath sounds: Normal breath sounds.  Abdominal:     Palpations: Abdomen is soft.     Tenderness: There is no abdominal tenderness.  Musculoskeletal:        General: No deformity.     Cervical back: Neck supple.     Comments: Patient with some mild discomfort to both hips with range of motion.  Patient with some mild tenderness to palpation lumbar spine.  Also with some mild tenderness to palpation to the left shoulder but good range of motion no deformity.  Radial pulses distally 1-2+.  Good cap refill to the toes of the lower extremities.  No significant swelling.  Skin:    General: Skin is warm and dry.  Neurological:     Mental Status: He is alert.     ED Results / Procedures / Treatments   Labs (all labs ordered are listed, but only abnormal results are displayed) Labs Reviewed  BASIC METABOLIC PANEL - Abnormal; Notable for the following components:      Result Value   Potassium 3.4 (*)    BUN 25 (*)    Creatinine, Ser 1.32 (*)    Calcium 8.3 (*)    GFR calc non Af Amer 50 (*)    GFR calc Af Amer 57 (*)    All other components within normal limits  CBC WITH DIFFERENTIAL/PLATELET - Abnormal; Notable for the following components:   RBC 3.52 (*)    Hemoglobin 8.1 (*)    HCT 27.9 (*)    MCV 79.3 (*)    MCH 23.0 (*)    MCHC 29.0 (*)    RDW 17.6 (*)    Monocytes Absolute 1.2 (*)    All other components within normal limits  URINALYSIS, ROUTINE W REFLEX MICROSCOPIC - Abnormal; Notable for the following components:   Color, Urine AMBER (*)    APPearance  HAZY (*)    Glucose, UA 50 (*)    Protein, ur 30 (*)    All other components within normal limits  CK - Abnormal; Notable for the following components:   Total CK 981 (*)    All other components within normal limits  PROTIME-INR - Abnormal; Notable for the following components:   Prothrombin Time 16.3 (*)    INR 1.4 (*)    All other components within normal limits  APTT - Abnormal; Notable for the following components:   aPTT 37 (*)    All other components within normal limits  HEMOGLOBIN A1C - Abnormal; Notable for the following components:   Hgb A1c MFr Bld 7.5 (*)    All other components within normal limits  TROPONIN I (HIGH SENSITIVITY) - Abnormal; Notable for the following components:   Troponin I (High Sensitivity) 19 (*)    All other components within normal limits  SARS CORONAVIRUS 2 BY RT PCR (HOSPITAL ORDER, [facility])  URINE CULTURE  LACTIC ACID, PLASMA  TSH  VITAMIN B12  FOLATE  CBC  MAGNESIUM  CBC WITH DIFFERENTIAL/PLATELET  COMPREHENSIVE METABOLIC PANEL  POC OCCULT BLOOD, ED  EKG EKG Interpretation  Date/Time:  Saturday April 30 2020 20:03:52 EDT Ventricular Rate:  81 PR Interval:    QRS Duration: 147 QT Interval:  479 QTC Calculation: 487 R Axis:   -37 Text Interpretation: Sinus rhythm Multiform ventricular premature complexes Nonspecific IVCD with LAD Inferolateral infarct, old Interpretation limited secondary to artifact Confirmed by Fredia Sorrow [phone]) on 04/30/2020 8:43:57 PM   Radiology DG Ribs Unilateral W/Chest Right  Result Date: 04/30/2020 CLINICAL DATA:  Fall EXAM: RIGHT RIBS AND CHEST - 3+ VIEW COMPARISON:  Chest radiograph dated 02/28/2016 FINDINGS: No fracture or other bone lesions are seen involving the ribs. There is no evidence of pneumothorax or pleural effusion. Both lungs are clear. Heart size and mediastinal contours are within normal limits. IMPRESSION: Negative. Electronically Signed   By: Zerita Boers M.D.   On: 04/30/2020 18:14   DG Lumbar Spine Complete  Result Date: 04/30/2020 CLINICAL DATA:  Fall with back pain EXAM: LUMBAR SPINE - COMPLETE 4+ VIEW COMPARISON:  None. FINDINGS: There is no evidence of lumbar spine fracture. There is lumbar dextrocurvature. Moderate to severe degenerative changes are seen in the lumbar spine. IMPRESSION: Negative for acute fracture. Electronically Signed   By: Zerita Boers M.D.   On: 04/30/2020 18:16   DG Shoulder Left  Result Date: 04/30/2020 CLINICAL DATA:  Fall with left shoulder pain EXAM: LEFT SHOULDER - 2+ VIEW COMPARISON:  Left shoulder radiographs dated 02/18/2004. FINDINGS: There is no evidence of fracture or dislocation. Degenerative changes are seen in the acromioclavicular joint. Soft tissues are unremarkable. IMPRESSION: Negative. Electronically Signed   By: Zerita Boers M.D.   On: 04/30/2020 18:18   DG Hip Unilat With Pelvis 2-3 Views Left  Result Date: 04/30/2020 CLINICAL DATA:  Patient status post fall.  Leg weakness. EXAM: DG HIP (WITH OR WITHOUT PELVIS) 2-3V LEFT COMPARISON:  None. FINDINGS: Lower lumbar spine degenerative changes. SI joints unremarkable. Pelvic phleboliths. Mild bilateral hip joint degenerative changes. No evidence for acute fracture. IMPRESSION: No acute osseous abnormality. Electronically Signed   By: Lovey Newcomer M.D.   On: 04/30/2020 10:35   DG Hip Unilat W or Wo Pelvis 2-3 Views Right  Result Date: 04/30/2020 CLINICAL DATA:  Fall with hip pain EXAM: DG HIP (WITH OR WITHOUT PELVIS) 2-3V RIGHT COMPARISON:  None. FINDINGS: There is no evidence of hip fracture or dislocation. Degenerative changes are seen in the spine and hips. IMPRESSION: No acute findings. Electronically Signed   By: Zerita Boers M.D.   On: 04/30/2020 18:19    Procedures Procedures (including critical care time)  Medications Ordered in ED Medications  0.9 %  sodium chloride infusion (has no administration in time range)  insulin aspart  (novoLOG) injection 0-15 Units (has no administration in time range)  traMADol (ULTRAM) tablet 50 mg (has no administration in time range)    Or  traMADol (ULTRAM) tablet 100 mg (has no administration in time range)  metoprolol succinate (TOPROL-XL) 24 hr tablet 25 mg (has no administration in time range)  enzalutamide (XTANDI) capsule 80 mg (has no administration in time range)  atorvastatin (LIPITOR) tablet 20 mg (has no administration in time range)  nitroGLYCERIN (NITROSTAT) SL tablet 0.4 mg (has no administration in time range)  ramipril (ALTACE) capsule 10 mg (has no administration in time range)  tamsulosin (FLOMAX) capsule 0.4 mg (has no administration in time range)  polyethylene glycol (MIRALAX / GLYCOLAX) packet 17 g (has no administration in time range)  ondansetron (ZOFRAN) tablet 4 mg (has  no administration in time range)    Or  ondansetron (ZOFRAN) injection 4 mg (has no administration in time range)  acetaminophen (TYLENOL) tablet 650 mg (has no administration in time range)    Or  acetaminophen (TYLENOL) suppository 650 mg (has no administration in time range)  sodium chloride 0.9 % bolus 500 mL (0 mLs Intravenous Stopped 04/30/20 2013)    ED Course  I have reviewed the triage vital signs and the nursing notes.  Pertinent labs & imaging results that were available during my care of the patient were reviewed by me and considered in my medical decision making (see chart for details).    MDM Rules/Calculators/A&P                          Work-up here with some borderline elevation in the CK.  Less than 2000.  Also with a slight worsening of his renal function.  But patient has been laying on the bathroom floor all night long.  So some evidence of some mild rhabdomyolysis.  Also patient did receive 500 cc bolus of fluids here and then a maintenance fluid.  Contacted hospitalist about admission.  Rest of his work-up without significant findings.  Patient will be admitted for  observation overnight to follow his renal function as well as his CKs.  Hospitalist wanted to go ahead and on a CT of his had an EKG.  Those orders placed.  Covid testing is pending. Final Clinical Impression(s) / ED Diagnoses Final diagnoses:  Traumatic rhabdomyolysis, initial encounter ([place])  AKI (acute kidney injury) [facility])    Rx / [place] Orders ED Discharge Orders    None       Fredia Sorrow, MD 04/30/20 2057

## 2020-04-30 NOTE — ED Notes (Signed)
Back from CT

## 2020-05-01 ENCOUNTER — Observation Stay (HOSPITAL_COMMUNITY): Payer: Medicare Other

## 2020-05-01 DIAGNOSIS — Y92009 Unspecified place in unspecified non-institutional (private) residence as the place of occurrence of the external cause: Secondary | ICD-10-CM | POA: Diagnosis not present

## 2020-05-01 DIAGNOSIS — I25119 Atherosclerotic heart disease of native coronary artery with unspecified angina pectoris: Secondary | ICD-10-CM | POA: Diagnosis not present

## 2020-05-01 DIAGNOSIS — I340 Nonrheumatic mitral (valve) insufficiency: Secondary | ICD-10-CM

## 2020-05-01 DIAGNOSIS — R569 Unspecified convulsions: Secondary | ICD-10-CM

## 2020-05-01 DIAGNOSIS — I639 Cerebral infarction, unspecified: Secondary | ICD-10-CM | POA: Diagnosis not present

## 2020-05-01 DIAGNOSIS — C801 Malignant (primary) neoplasm, unspecified: Secondary | ICD-10-CM | POA: Diagnosis not present

## 2020-05-01 DIAGNOSIS — C7951 Secondary malignant neoplasm of bone: Secondary | ICD-10-CM | POA: Diagnosis not present

## 2020-05-01 DIAGNOSIS — G9389 Other specified disorders of brain: Secondary | ICD-10-CM | POA: Diagnosis not present

## 2020-05-01 DIAGNOSIS — W19XXXA Unspecified fall, initial encounter: Secondary | ICD-10-CM | POA: Diagnosis not present

## 2020-05-01 LAB — ECHOCARDIOGRAM COMPLETE BUBBLE STUDY
Area-P 1/2: 3.77 cm2
S' Lateral: 2.2 cm

## 2020-05-01 LAB — CBC WITH DIFFERENTIAL/PLATELET
Abs Immature Granulocytes: 0.04 10*3/uL (ref 0.00–0.07)
Basophils Absolute: 0 10*3/uL (ref 0.0–0.1)
Basophils Relative: 0 %
Eosinophils Absolute: 0 10*3/uL (ref 0.0–0.5)
Eosinophils Relative: 0 %
HCT: 24.9 % — ABNORMAL LOW (ref 39.0–52.0)
Hemoglobin: 7.7 g/dL — ABNORMAL LOW (ref 13.0–17.0)
Immature Granulocytes: 1 %
Lymphocytes Relative: 13 %
Lymphs Abs: 1.1 10*3/uL (ref 0.7–4.0)
MCH: 23.5 pg — ABNORMAL LOW (ref 26.0–34.0)
MCHC: 30.9 g/dL (ref 30.0–36.0)
MCV: 75.9 fL — ABNORMAL LOW (ref 80.0–100.0)
Monocytes Absolute: 0.9 10*3/uL (ref 0.1–1.0)
Monocytes Relative: 11 %
Neutro Abs: 6 10*3/uL (ref 1.7–7.7)
Neutrophils Relative %: 75 %
Platelets: 213 10*3/uL (ref 150–400)
RBC: 3.28 MIL/uL — ABNORMAL LOW (ref 4.22–5.81)
RDW: 17.5 % — ABNORMAL HIGH (ref 11.5–15.5)
WBC: 8.1 10*3/uL (ref 4.0–10.5)
nRBC: 0 % (ref 0.0–0.2)

## 2020-05-01 LAB — IRON AND TIBC
Iron: 13 ug/dL — ABNORMAL LOW (ref 45–182)
Saturation Ratios: 5 % — ABNORMAL LOW (ref 17.9–39.5)
TIBC: 238 ug/dL — ABNORMAL LOW (ref 250–450)
UIBC: 225 ug/dL

## 2020-05-01 LAB — COMPREHENSIVE METABOLIC PANEL
ALT: 18 U/L (ref 0–44)
AST: 53 U/L — ABNORMAL HIGH (ref 15–41)
Albumin: 2 g/dL — ABNORMAL LOW (ref 3.5–5.0)
Alkaline Phosphatase: 159 U/L — ABNORMAL HIGH (ref 38–126)
Anion gap: 10 (ref 5–15)
BUN: 21 mg/dL (ref 8–23)
CO2: 22 mmol/L (ref 22–32)
Calcium: 7.7 mg/dL — ABNORMAL LOW (ref 8.9–10.3)
Chloride: 106 mmol/L (ref 98–111)
Creatinine, Ser: 1 mg/dL (ref 0.61–1.24)
GFR calc Af Amer: >60 mL/min (ref >60)
GFR calc non Af Amer: >60 mL/min (ref >60)
Glucose, Bld: 126 mg/dL — ABNORMAL HIGH (ref 70–99)
Potassium: 3.2 mmol/L — ABNORMAL LOW (ref 3.5–5.1)
Sodium: 138 mmol/L (ref 135–145)
Total Bilirubin: 0.7 mg/dL (ref 0.3–1.2)
Total Protein: 6.6 g/dL (ref 6.5–8.1)

## 2020-05-01 LAB — URINE CULTURE: Culture: <10000 — AB

## 2020-05-01 LAB — FOLATE: Folate: 37.5 ng/mL (ref >5.9)

## 2020-05-01 LAB — GLUCOSE, CAPILLARY
Glucose-Capillary: 57 mg/dL — ABNORMAL LOW (ref 70–99)
Glucose-Capillary: 151 mg/dL — ABNORMAL HIGH (ref 70–99)
Glucose-Capillary: 223 mg/dL — ABNORMAL HIGH (ref 70–99)
Glucose-Capillary: 136 mg/dL — ABNORMAL HIGH (ref 70–99)
Glucose-Capillary: 81 mg/dL (ref 70–99)

## 2020-05-01 LAB — MAGNESIUM: Magnesium: 2 mg/dL (ref 1.7–2.4)

## 2020-05-01 LAB — CK: Total CK: 717 U/L — ABNORMAL HIGH (ref 49–397)

## 2020-05-01 LAB — TYPE AND SCREEN
ABO/RH(D): B NEG
Antibody Screen: NEGATIVE

## 2020-05-01 LAB — VITAMIN B12: Vitamin B-12: 1350 pg/mL — ABNORMAL HIGH (ref 180–914)

## 2020-05-01 LAB — ABO/RH: ABO/RH(D): B NEG

## 2020-05-01 LAB — TSH: TSH: 1.164 u[IU]/mL (ref 0.350–4.500)

## 2020-05-01 LAB — TROPONIN I (HIGH SENSITIVITY): Troponin I (High Sensitivity): 19 ng/L — ABNORMAL HIGH (ref <18)

## 2020-05-01 MED ORDER — IOHEXOL 9 MG/ML PO SOLN
500.0000 mL | ORAL | Status: AC
  Administered 2020-05-01: 500 mL via ORAL

## 2020-05-01 MED ORDER — IOHEXOL 300 MG/ML  SOLN
100.0000 mL | Freq: Once | INTRAMUSCULAR | Status: AC | PRN
  Administered 2020-05-01: 100 mL via INTRAVENOUS

## 2020-05-01 MED ORDER — IOHEXOL 9 MG/ML PO SOLN
ORAL | Status: AC
  Filled 2020-05-01: qty 500

## 2020-05-01 MED ORDER — SODIUM CHLORIDE 0.9% FLUSH
9.0000 mL | Freq: Once | INTRAVENOUS | Status: AC
  Administered 2020-05-01: 9 mL via INTRAVENOUS

## 2020-05-01 MED ORDER — PHENOL 1.4 % MT LIQD
1.0000 | OROMUCOSAL | Status: DC | PRN
  Administered 2020-05-05 – 2020-05-06 (×2): 1 via OROMUCOSAL
  Filled 2020-05-01: qty 177

## 2020-05-01 MED ORDER — ASPIRIN EC 81 MG PO TBEC
81.0000 mg | DELAYED_RELEASE_TABLET | Freq: Every day | ORAL | Status: DC
  Administered 2020-05-01 – 2020-05-02 (×2): 81 mg via ORAL
  Filled 2020-05-01 (×2): qty 1

## 2020-05-01 MED ORDER — GADOBUTROL 1 MMOL/ML IV SOLN
6.0000 mL | Freq: Once | INTRAVENOUS | Status: AC | PRN
  Administered 2020-05-01: 6 mL via INTRAVENOUS

## 2020-05-01 NOTE — Progress Notes (Signed)
Carotid artery duplex completed. Refer to "CV Proc" under chart review to view preliminary results.  05/01/2020 10:42 AM Kelby Aline., MHA, RVT, RDCS, RDMS

## 2020-05-01 NOTE — Evaluation (Signed)
Clinical/Bedside Swallow Evaluation Patient Details  Name: Adrian Singh MRN: [id number] Date of Birth: 1933-09-23  Today's Date: 05/01/2020 Time: SLP Start Time (ACUTE ONLY): 53 SLP Stop Time (ACUTE ONLY): 1129 SLP Time Calculation (min) (ACUTE ONLY): 24 min  Past Medical History:  Past Medical History:  Diagnosis Date  . Arthritis    "legs, back" (10/06/2015)  . Atrial fibrillation ([place])    x1  . CAD (coronary artery disease)    nonobstructive  . Cancer ([place])    skin cancer on ear (froze it off) and back (cut it off)  . Dysrhythmia   . Esophageal reflux    hx of  . History of kidney stones   . Hx of heart artery stent   . Hypertrophy of prostate without urinary obstruction and other lower urinary tract symptoms (LUTS)   . Osteoarthrosis, unspecified whether generalized or localized, unspecified site   . Prostate cancer ([place])   . Skin cancer   . Stroke [facility]) 03/2014   "had a series of mini strokes; maybe 4"; denies residual on 10/06/2015  . Thrombocytopenia ([place])   . Type II diabetes mellitus ([place])    type 2  . Unspecified essential hypertension    Past Surgical History:  Past Surgical History:  Procedure Laterality Date  . BACK SURGERY     2001  . CARDIAC CATHETERIZATION  12/2007  . CARDIAC CATHETERIZATION N/A 09/22/2015   Procedure: Left Heart Cath and Coronary Angiography;  Surgeon: Peter M Martinique, MD;  Location: [facility];  Service: Cardiovascular;  Laterality: N/A;  . CARDIAC CATHETERIZATION N/A 10/06/2015   Procedure: Coronary Stent Intervention;  Surgeon: Peter M Martinique, MD;  Location: [facility];  Service: Cardiovascular;  Laterality: N/A;  . CARDIAC CATHETERIZATION N/A 02/29/2016   Procedure: Left Heart Cath and Coronary Angiography;  Surgeon: Jettie Booze, MD;  Location: [place] [facility];  Service: Cardiovascular;  Laterality: N/A;  . COLONOSCOPY W/ BIOPSIES AND POLYPECTOMY    . CORONARY STENT PLACEMENT  10/06/2015   [name]  with DES  .  EAR CYST EXCISION N/A 04/06/2015   Procedure: EXCISION OF SCALP CYST;  Surgeon: Donnie Mesa, MD;  Location: [facility];  Service: General;  Laterality: N/A;  . ESOPHAGOGASTRODUODENOSCOPY (EGD) WITH ESOPHAGEAL DILATION  2001   with dilation  . LUMBAR DISC SURGERY  09/26/1999   "cleaned out arthritis and bone spurs"  . SHOULDER ARTHROSCOPY W/ ROTATOR CUFF REPAIR Left 2005  . THULIUM LASER TURP (TRANSURETHRAL RESECTION OF PROSTATE) N/A 01/21/2017   Procedure: Marcelino Duster LASER TURP (TRANSURETHRAL RESECTION OF PROSTATE) CAUTERIZATION OF BLADDER LESION;  Surgeon: Franchot Gallo, MD;  Location: [facility];  Service: Urology;  Laterality: N/A;   HPI:  Pt is an 86 yo male presenting s/p fall with L sided weakness and slurred speech. CXR 8/15 without active disease. MRI showed acute infarct involving the R frontal centrum semi ovale as well as a hyperintense lesion involving the posterior R frontal white matter and R corpus callosum favored to reflect subacute ischemic infarct. PMH includes: HTN, DM, stroke, prostate ca, skin ca, GER, esophageal dilation, CAD, afib   Assessment / Plan / Recommendation Clinical Impression  Pt has no overt s/s of aspiration, but does have reduced ROM and mild weakness bilaterally during oral motor exam. With additional time he masticates his food and clears his mouth well, also using purees to soften/moisten his dry crackers. Pt describes a h/o esophageal dysphagia with previous dilation, trouble with solids > liquids, odynophagia, and  GER. Subjective complaints during PO intake appears to be consistent with this baseline level of function, with second swallows also noted regardless of consistency. Discussed a slightly softer diet as an option, but pt is particular about how his meats are cut and how he eats certain solids. Therefore, recommend continuing regular solids and thin liquids to give him fuller range of the menu, encouraging him to select foods that would be soft enough for him  to eat at home. He will benefit from SLP f/u for tolerance. Also recommend SLP cognitive-linguistic evaluation given MRI results and persistent dysarthria noted.   SLP Visit Diagnosis: Dysphagia, unspecified (R13.10)    Aspiration Risk       Diet Recommendation Regular;Thin liquid   Liquid Administration via: Cup;Straw Medication Administration: Whole meds with puree Supervision: Staff to assist with self feeding Compensations: Minimize environmental distractions;Slow rate;Small sips/bites;Follow solids with liquid Postural Changes: Seated upright at 90 degrees;Remain upright for at least 30 minutes after po intake    Other  Recommendations Oral Care Recommendations: Oral care BID   Follow up Recommendations  (tba)      Frequency and Duration min 2x/week  2 weeks       Prognosis Prognosis for Safe Diet Advancement: Good      Swallow Study   General HPI: Pt is an 86 yo male presenting s/p fall with L sided weakness and slurred speech. CXR 8/15 without active disease. MRI showed acute infarct involving the R frontal centrum semi ovale as well as a hyperintense lesion involving the posterior R frontal white matter and R corpus callosum favored to reflect subacute ischemic infarct. PMH includes: HTN, DM, stroke, prostate ca, skin ca, GER, esophageal dilation, CAD, afib Type of Study: Bedside Swallow Evaluation Previous Swallow Assessment: none in chart Diet Prior to this Study: Regular;Thin liquids Temperature Spikes Noted: No Respiratory Status: Room air History of Recent Intubation: No Behavior/Cognition: Alert;Cooperative;Requires cueing Oral Cavity Assessment: Within Functional Limits Oral Care Completed by SLP: No Oral Cavity - Dentition: Dentures, top;Dentures, bottom Vision: Functional for self-feeding Self-Feeding Abilities: Able to feed self;Needs set up Patient Positioning: Upright in chair Baseline Vocal Quality: Normal Volitional Swallow: Able to elicit     Oral/Motor/Sensory Function Overall Oral Motor/Sensory Function: Generalized oral weakness   Ice Chips Ice chips: Not tested   Thin Liquid Thin Liquid: Impaired Presentation: Cup;Self Fed;Straw Pharyngeal  Phase Impairments: Multiple swallows    Nectar Thick Nectar Thick Liquid: Not tested   Honey Thick Honey Thick Liquid: Not tested   Puree Puree: Impaired Presentation: Self Fed;Spoon Pharyngeal Phase Impairments: Multiple swallows   Solid     Solid: Impaired Presentation: Self Fed Pharyngeal Phase Impairments: Multiple swallows      Osie Bond., M.A. [facility] Pager [phone] Office [phone]  05/01/2020,11:36 AM

## 2020-05-01 NOTE — Progress Notes (Signed)
Pharmacy Note  14 yom presenting s/p fall, weakness. Patient meets inpatient hold criteria currently for PTA enzalutamide with Hg <8. Discussed with Dr. Wyline Copas - ok to hold for now and monitor.  Enzalutamide [facility]) Hold Criteria: . Seizures . Screen for drug interactions . Hgb < 8 (currently down to 7.7) . WBC < 2000 . ANC < 1000 . Pltc < 50K   Arturo Morton, PharmD, BCPS Please check AMION for all [facility] contact numbers Clinical Pharmacist 05/01/2020 8:40 AM

## 2020-05-01 NOTE — Consult Note (Addendum)
NEUROLOGY CONSULT  Reason for Consult: abnormal MRI brain Referring Physician: Dr Wyline Copas  CC: mouth/jaw pain  HPI: Adrian Singh is an 86 y.o. male with Afib, on Xarelto, CAD, s/p stent, CHF, DM2, HTN, GERD, HLD, Prostate and bone cancer. On 8/14 he presnted to ER after a fall. Family at bedside state he has been in general decline for last 2 months with poor appetite, weight loss and general weakness. MRI brain shows lesions in the Rt semi ovale and R corpus callosum. His clinical course does not fit acute or subacute stroke. High suspicion this is an underlying lesion/mass given h/o cancer and slow decline over months. He has no focal deficits, but is diffusely weak.   Past Medical History Past Medical History:  Diagnosis Date  . Arthritis    "legs, back" (10/06/2015)  . Atrial fibrillation ([place])    x1  . CAD (coronary artery disease)    nonobstructive  . Cancer ([place])    skin cancer on ear (froze it off) and back (cut it off)  . Dysrhythmia   . Esophageal reflux    hx of  . History of kidney stones   . Hx of heart artery stent   . Hypertrophy of prostate without urinary obstruction and other lower urinary tract symptoms (LUTS)   . Osteoarthrosis, unspecified whether generalized or localized, unspecified site   . Prostate cancer ([place])   . Skin cancer   . Stroke [facility]) 03/2014   "had a series of mini strokes; maybe 4"; denies residual on 10/06/2015  . Thrombocytopenia ([place])   . Type II diabetes mellitus ([place])    type 2  . Unspecified essential hypertension     Past Surgical History Past Surgical History:  Procedure Laterality Date  . BACK SURGERY     2001  . CARDIAC CATHETERIZATION  12/2007  . CARDIAC CATHETERIZATION N/A 09/22/2015   Procedure: Left Heart Cath and Coronary Angiography;  Surgeon: Peter M Martinique, MD;  Location: [place] [facility];  Service: Cardiovascular;  Laterality: N/A;  . CARDIAC CATHETERIZATION N/A 10/06/2015   Procedure: Coronary Stent Intervention;   Surgeon: Peter M Martinique, MD;  Location: [place] [facility];  Service: Cardiovascular;  Laterality: N/A;  . CARDIAC CATHETERIZATION N/A 02/29/2016   Procedure: Left Heart Cath and Coronary Angiography;  Surgeon: Jettie Booze, MD;  Location: [place] [facility];  Service: Cardiovascular;  Laterality: N/A;  . COLONOSCOPY W/ BIOPSIES AND POLYPECTOMY    . CORONARY STENT PLACEMENT  10/06/2015   [name]  with DES  . EAR CYST EXCISION N/A 04/06/2015   Procedure: EXCISION OF SCALP CYST;  Surgeon: Donnie Mesa, MD;  Location: [place];  Service: General;  Laterality: N/A;  . ESOPHAGOGASTRODUODENOSCOPY (EGD) WITH ESOPHAGEAL DILATION  2001   with dilation  . LUMBAR DISC SURGERY  09/26/1999   "cleaned out arthritis and bone spurs"  . SHOULDER ARTHROSCOPY W/ ROTATOR CUFF REPAIR Left 2005  . THULIUM LASER TURP (TRANSURETHRAL RESECTION OF PROSTATE) N/A 01/21/2017   Procedure: Marcelino Duster LASER TURP (TRANSURETHRAL RESECTION OF PROSTATE) CAUTERIZATION OF BLADDER LESION;  Surgeon: Franchot Gallo, MD;  Location: [facility];  Service: Urology;  Laterality: N/A;    Family History Family History  Problem Relation Age of Onset  . Stroke Father   . Peripheral vascular disease Father        amputation  . Heart failure Mother        CHF  . Coronary artery disease Mother   . Heart attack Mother 18  Multiple  . COPD Brother   . Heart disease Brother   . Cancer Brother        Bone  . Lung cancer Sister     Social History    reports that he has quit smoking. His smoking use included cigarettes. He quit after 3.00 years of use. He has never used smokeless tobacco. He reports that he does not drink alcohol and does not use drugs.  Allergies Allergies  Allergen Reactions  . Soma [Carisoprodol] Other (See Comments)    "did a number on me"  . Doxazosin Other (See Comments)    dizziness    Home Medications Medications Prior to Admission  Medication Sig Dispense Refill  . acetaminophen (TYLENOL) 650 MG CR  tablet Take 650 mg by mouth 2 (two) times daily as needed for pain.    . Ascorbic Acid (VITAMIN C) 1000 MG tablet Take 1,000 mg by mouth daily.    Marland Kitchen atorvastatin (LIPITOR) 40 MG tablet Take 1/2 (one-half) tablet by mouth once daily (Patient taking differently: Take 20 mg by mouth daily. ) 45 tablet 3  . b complex vitamins capsule Take 1 capsule by mouth daily.    . calcium carbonate (CALCIUM 600) 1500 (600 Ca) MG TABS tablet Take 600 mg of elemental calcium by mouth daily with breakfast.     . Cholecalciferol (VITAMIN D3) 1.25 MG (50000 UT) CAPS Take 50,000 Units by mouth once a week. Saturday    . cyanocobalamin 1000 MCG tablet Take 1,000 mg by mouth at bedtime.     . docusate sodium (COLACE) 250 MG capsule Take 250 mg by mouth daily as needed for constipation.     . furosemide (LASIX) 40 MG tablet Take 1 tablet (40 mg total) by mouth daily as needed. 15 tablet 0  . glimepiride (AMARYL) 4 MG tablet Take 1 tablet by mouth twice daily (Patient taking differently: Take 4 mg by mouth in the morning and at bedtime. ) 180 tablet 3  . metFORMIN (GLUCOPHAGE) 500 MG tablet Take 1 tablet (500 mg total) by mouth daily with breakfast. 270 tablet 3  . metoprolol succinate (TOPROL-XL) 100 MG 24 hr tablet Take 50 mg by mouth at bedtime. Take with or immediately following a meal.    . Multiple Vitamin (MULTIVITAMIN WITH MINERALS) TABS tablet Take 1 tablet by mouth daily. ONE-A-DAY FOR MEN    . nitroGLYCERIN (NITROSTAT) 0.4 MG SL tablet Place 1 tablet (0.4 mg total) under the tongue every 5 (five) minutes as needed for chest pain. 25 tablet 0  . OVER THE COUNTER MEDICATION Take 15 mLs by mouth in the morning and at bedtime. Bio-Cell    . ramipril (ALTACE) 10 MG capsule Take 1 capsule by mouth once daily (Patient taking differently: Take 10 mg by mouth daily. ) 90 capsule 1  . rivaroxaban (XARELTO) 20 MG TABS tablet Take 1 tablet (20 mg total) by mouth daily with supper. 90 tablet 3  . tamsulosin (FLOMAX) 0.4 MG  CAPS capsule Take 1 capsule (0.4 mg total) by mouth daily. (Patient taking differently: Take 0.4 mg by mouth daily after supper. ) 90 capsule 3  . traMADol (ULTRAM) 50 MG tablet TAKE 1 TABLET BY MOUTH THREE TIMES DAILY AS NEEDED (Patient taking differently: Take 50 mg by mouth 2 (two) times daily as needed for moderate pain. ) 30 tablet 0  . XTANDI 40 MG capsule Take 80 mg by mouth daily.     Marland Kitchen BAYER MICROLET LANCETS lancets Use to test  blood sugar once daily dx: E11.40 100 each 1  . Blood Glucose Monitoring Suppl (CONTOUR NEXT EZ MONITOR) w/Device KIT USE TO TEST BLOOD SUGAR ONCE DAILY. Dx Code E11.40 1 kit 0  . glucose blood (CONTOUR NEXT TEST) test strip Check blood sugar once daily and as directed.Dx Code E11.40 100 each 1  . metoprolol succinate (TOPROL-XL) 25 MG 24 hr tablet Take 1 tablet (25 mg total) by mouth daily. (Patient taking differently: Take 25 mg by mouth at bedtime. ) 90 tablet 3    Hospital Medications . aspirin EC  81 mg Oral Daily  . atorvastatin  20 mg Oral Daily  . feeding supplement (ENSURE ENLIVE)  237 mL Oral BID BM  . insulin aspart  0-15 Units Subcutaneous TID AC & HS  . iohexol  500 mL Oral Q1H  . iohexol      . iohexol      . metoprolol succinate  25 mg Oral QHS  . tamsulosin  0.4 mg Oral QPC supper     ROS: History obtained from pt  General ROS: +  Fatigue, wt loss. negative for - chills, fever, night sweats, weight gain  Psychological ROS: negative for - behavioral disorder, hallucinations, memory difficulties, mood swings or suicidal ideation Ophthalmic ROS: negative for - blurry vision, double vision, eye pain or loss of vision ENT ROS: negative for - epistaxis, nasal discharge, oral lesions, sore throat, tinnitus or vertigo Allergy and Immunology ROS: negative for - hives or itchy/watery eyes Hematological and Lymphatic ROS: negative for - bleeding problems, bruising or swollen lymph nodes Endocrine ROS: negative for - galactorrhea, hair pattern  changes, polydipsia/polyuria or temperature intolerance Respiratory ROS:+ cough.  negative for - hemoptysis, shortness of breath or wheezing Cardiovascular ROS: + "side of chest pain" and irregular heartbeat. negative for - dyspnea on exertion, edema Gastrointestinal ROS: negative for - abdominal pain, diarrhea, hematemesis, nausea/vomiting or stool incontinence Genito-Urinary ROS: negative for - dysuria, hematuria, incontinence or urinary frequency/urgency Musculoskeletal ROS: + muscular weakness. negative for - joint swelling Neurological ROS: as noted in HPI Dermatological ROS: negative for rash and skin lesion changes   Physical Examination: Vitals:   05/01/20 0024 05/01/20 0419 05/01/20 0845 05/01/20 1152  BP: (!) 156/70 (!) 166/82 (!) 149/70 112/89  Pulse: 70 71 80 80  Resp: _0 Temp: 98 F (36.7 C) 98.3 F (36.8 C) 97.6 F (36.4 C) 97.6 F (36.4 C)  TempSrc:  Oral Oral Oral  SpO2: 100% 98% 97% 98%  Weight:  72.1 kg      General - frail and elderly Heart - Regular rate and rhythm - no murmer Lungs - Clear to auscultation Abdomen - Soft - non tender Extremities - Distal pulses intact - no edema Skin - Warm and dry  Neurologic Examination:   Mental Status:  Alert, oriented to name, place. Speech is sparse and dysarthric as he is missing his dentures. He is sleepy and needs cues to follow commands. He is slow and unable to keep attention throughout exam. Follow simple commands.  On attending examination later in the day, he brightens remarkably when talking about his great-grandchildren, with one expected on August 26.  He is unable to calculate the number of days until August 26, and is unable to name the date which daughter states is his baseline. He prefers to look straight ahead and does not attend examiner on either side, with no clear neglect of the left Cranial Nerves:  II-bilateral visual fields intact  III/IV/VI-Pupils were equal and reacted. Extraocular  movements were full.   V/VII-no facial numbness and no facial weakness.  VIII-hearing normal.  X-normal speech and symmetrical palatal movement.  XII-midline tongue extension  Motor: He has some mild pronator drift in bilateral upper extremities, left worse than right.  His right lower extremity is 5 out of 5 knee extension, knee flexion, ankle dorsiflexion and plantar flexion.  His left lower extremity is 3 out of 5 in knee extension, 4 out of 5 in knee flexion, 5 out of 5 in ankle dorsiflexion and plantarflexion Sensory: Intact to light touch in all extremities. Deep Tendon Reflexes: 2/4 throughout Plantars: Downgoing bilaterally  Cerebellar: no gross ataxia, difficult following through with finger to nose, unable to do heel to shin d/t pain and difficulty with following complex commands Gait: not tested   LABORATORY STUDIES:  Basic Metabolic Panel: Recent Labs  Lab 04/29/20 1218 04/30/20 1637 05/01/20 0059  NA 136 138 138  K 4.7 3.4* 3.2*  CL 101 104 106  CO2 _0 GLUCOSE 245* 98 126*  BUN 20 25* 21  CREATININE 1.07 1.32* 1.00  CALCIUM 8.2* 8.3* 7.7*  MG  --   --  2.0    Liver Function Tests: Recent Labs  Lab 04/29/20 1218 05/01/20 0059  AST 21 53*  ALT 7 18  ALKPHOS 183* 159*  BILITOT 0.5 0.7  PROT 6.5 6.6  ALBUMIN 2.9* 2.0*   No results for input(s): LIPASE, AMYLASE in the last 168 hours. No results for input(s): AMMONIA in the last 168 hours.  CBC: Recent Labs  Lab 04/29/20 1218 04/30/20 1637 05/01/20 0059  WBC 6.6 10.2 8.1  NEUTROABS  --  7.5 6.0  HGB 8.5 Repeated and verified X2.* 8.1* 7.7*  HCT 26.7 Repeated and verified X2.* 27.9* 24.9*  MCV 75.0* 79.3* 75.9*  PLT 224.0 229 213    Cardiac Enzymes: Recent Labs  Lab 04/30/20 1637 05/01/20 0059  CKTOTAL 981* 717*    BNP: Invalid input(s): POCBNP  CBG: Recent Labs  Lab 04/30/20 2123 05/01/20 0606 05/01/20 0734 05/01/20 1155 05/01/20 1633  GLUCAP 60* 57* 151* 223* 136*     Microbiology:   Coagulation Studies: Recent Labs    04/30/20 1930  LABPROT 16.3*  INR 1.4*    Urinalysis:  Recent Labs  Lab 04/30/20 1930  COLORURINE AMBER*  LABSPEC 1.021  PHURINE 5.0  GLUCOSEU 50*  HGBUR NEGATIVE  BILIRUBINUR NEGATIVE  KETONESUR NEGATIVE  PROTEINUR 30*  NITRITE NEGATIVE  LEUKOCYTESUR NEGATIVE    Lipid Panel:     Component Value Date/Time   CHOL 83 11/14/2017 1613   TRIG 199.0 (H) 11/14/2017 1613   HDL 32.50 (L) 11/14/2017 1613   CHOLHDL 3 11/14/2017 1613   VLDL 39.8 11/14/2017 1613   LDLCALC 11 11/14/2017 1613    HgbA1C:  Lab Results  Component Value Date   HGBA1C 7.5 (H) 04/30/2020    Urine Drug Screen:  No results found for: LABOPIA, COCAINSCRNUR, LABBENZ, AMPHETMU, THCU, LABBARB   Alcohol Level:  No results for input(s): ETH in the last 168 hours.  Miscellaneous labs:  EKG  EKG  IMAGING: EEG  Result Date: 05/01/2020 Lora Havens, MD     05/01/2020  3:33 PM Patient Name: Adrian Singh MRN: [id number] Epilepsy Attending: Lora Havens Referring Physician/Provider: Dr Lesleigh Noe Date: 05/01/2020 Duration: 23.22 mins Patient history: 86yo M s/p fall and left sided weakness. EEG to evaluate for seizure Level of alertness: Awake, assleep AEDs  during EEG study: None Technical aspects: This EEG study was done with scalp electrodes positioned according to the 10-20 International system of electrode placement. Electrical activity was acquired at a sampling rate of _0  and reviewed with a high frequency filter of _1  and a low frequency filter of _2 . EEG data were recorded continuously and digitally stored. Description: The posterior dominant rhythm consists of 7.5 Hz activity of moderate voltage (25-35 uV) seen predominantly in posterior head regions, symmetric and reactive to eye opening and eye closing.  Sleep was characterized by vertex waves, sleep spindles (12 to 14 Hz), maximal frontocentral region. EEG showed  /intermittent generalized 3 to 6 Hz theta-delta slowing. Hyperventilation and photic stimulation were not performed.   ABNORMALITY -Intermittent slow, generalized IMPESSION: This study is suggestive of mild diffuse encephalopathy, nonspecific etiology. No seizures or epileptiform discharges were seen throughout the recording. Lora Havens   DG Chest 1 View  Result Date: 04/30/2020 CLINICAL DATA:  Pneumonia, history of AFib EXAM: CHEST  1 VIEW COMPARISON:  None. FINDINGS: The heart size and mediastinal contours are within normal limits. Both lungs are clear. The visualized skeletal structures are unremarkable. There is a small hiatal hernia. IMPRESSION: No active disease. Electronically Signed   By: Prudencio Pair M.D.   On: 04/30/2020 21:08   DG Ribs Unilateral W/Chest Right  Result Date: 04/30/2020 CLINICAL DATA:  Fall EXAM: RIGHT RIBS AND CHEST - 3+ VIEW COMPARISON:  Chest radiograph dated 02/28/2016 FINDINGS: No fracture or other bone lesions are seen involving the ribs. There is no evidence of pneumothorax or pleural effusion. Both lungs are clear. Heart size and mediastinal contours are within normal limits. IMPRESSION: Negative. Electronically Signed   By: Zerita Boers M.D.   On: 04/30/2020 18:14   DG Lumbar Spine Complete  Result Date: 04/30/2020 CLINICAL DATA:  Fall with back pain EXAM: LUMBAR SPINE - COMPLETE 4+ VIEW COMPARISON:  None. FINDINGS: There is no evidence of lumbar spine fracture. There is lumbar dextrocurvature. Moderate to severe degenerative changes are seen in the lumbar spine. IMPRESSION: Negative for acute fracture. Electronically Signed   By: Zerita Boers M.D.   On: 04/30/2020 18:16   CT Head Wo Contrast  Result Date: 04/30/2020 CLINICAL DATA:  Status post fall. EXAM: CT HEAD WITHOUT CONTRAST TECHNIQUE: Contiguous axial images were obtained from the base of the skull through the vertex without intravenous contrast. COMPARISON:  March 31, 2014 FINDINGS: Brain: There is  mild cerebral atrophy with widening of the extra-axial spaces and ventricular dilatation. There are areas of decreased attenuation within the white matter tracts of the supratentorial brain, consistent with microvascular disease changes. Vascular: No hyperdense vessel or unexpected calcification. Skull: Normal. Negative for fracture or focal lesion. Sinuses/Orbits: No acute finding. Other: None. IMPRESSION: 1. Generalized cerebral atrophy. 2. No acute intracranial abnormality. Electronically Signed   By: Virgina Norfolk M.D.   On: 04/30/2020 20:54   MR BRAIN WO CONTRAST  Result Date: 05/01/2020 CLINICAL DATA:  Initial evaluation for fall, left-sided weakness and slurred speech. EXAM: MRI HEAD WITHOUT CONTRAST TECHNIQUE: Multiplanar, multiecho pulse sequences of the brain and surrounding structures were obtained without intravenous contrast. COMPARISON:  Prior head CT from 04/30/2020. FINDINGS: Brain: Generalized age appropriate cerebral volume loss. No significant cerebral white matter disease for age. Few small foci of encephalomalacia and gliosis involving the right frontal parietal cortices consistent with chronic right MCA territory infarcts (series 11, images 22, 20). 5 mm focus of restricted diffusion involving the right frontal centrum semi  ovale consistent with a small acute ischemic infarct (series 5, image 85). No associated hemorrhage or mass effect. Additionally, there is a round well-circumscribed ovoid T2/FLAIR hyperintense lesion measuring 1.8 x 1.6 x 2.0 cm (series 10, image 19). Inferior extension to involve the right aspect of the body of the corpus callosum. Mild localized mass effect on the subjacent right lateral ventricle (series 9, image 10). Associated diffusion signal abnormality without frank restriction or ADC correlate. No associated susceptibility artifact to suggest hemorrhage. Findings indeterminate, but favored to reflect an evolving subacute ischemic infarct. Sequelae of  demyelination or possibly tumor could also be considered. No significant surrounding edema or FLAIR signal intensity. No other evidence for acute or subacute ischemia. Gray-white matter differentiation otherwise maintained. No foci of susceptibility artifact to suggest acute or chronic intracranial hemorrhage. No other mass lesion, midline shift or mass effect. No hydrocephalus or extra-axial fluid collection. Pituitary gland suprasellar region within normal limits. Midline structures intact. Vascular: Major intracranial vascular flow voids maintained. Skull and upper cervical spine: Craniocervical junction within normal limits. Degenerative spondylosis at C3-4 with resultant mild-to-moderate spinal stenosis. Subcentimeter T1 hypointense lesion within the C3 vertebral body likely degenerative. Bone marrow signal intensity within normal limits. No scalp soft tissue abnormality. Sinuses/Orbits: Globes and orbital soft tissues within normal limits. Mild scattered mucosal thickening noted within the ethmoidal air cells. Paranasal sinuses are otherwise clear. Trace right mastoid effusion noted, of doubtful significance. Inner ear structures grossly normal. Other: None. IMPRESSION: 1. 5 mm acute ischemic nonhemorrhagic infarct involving the right frontal centrum semi ovale. 2. 1.8 x 1.6 x 2.0 cm ovoid T2/FLAIR hyperintense lesion involving the posterior right frontal white matter and right corpus callosum, indeterminate, but favored to reflect an evolving subacute ischemic infarct. Sequelae of demyelination or possibly tumor could also be considered. Further assessment with postcontrast imaging recommended for complete characterization. Additionally, a short interval follow-up MRI in 2-3 months would likely be helpful for further evaluation as well. 3. Few small chronic right MCA territory infarcts involving the right frontoparietal cortices as above. 4. Otherwise normal brain MRI for age. Electronically Signed   By:  Jeannine Boga M.D.   On: 05/01/2020 01:37   MR BRAIN W CONTRAST  Result Date: 05/01/2020 CLINICAL DATA:  Brain mass EXAM: MRI HEAD WITH CONTRAST TECHNIQUE: Multiplanar, multiecho pulse sequences of the brain and surrounding structures were obtained with intravenous contrast. CONTRAST:  39m GADAVIST GADOBUTROL 1 MMOL/ML IV SOLN COMPARISON:  04/30/2020 head CT.  05/01/2020 MRI head. FINDINGS: Brain: Mild cerebral atrophy with ex vacuo dilatation. Sequela of remote right MCA territory infarcts, better demonstrated on prior exam. Previously visualized 5 mm focus of right centrum semiovale restricted diffusion does not demonstrate enhancement (10:40). Prior ovoid T2 hypointense lesion involving the right centrum semiovale and right corpus callosum body measuring 1.7 x 1.5 cm (10:40) demonstrates heterogenous enhancement. There are no additional foci of abnormal enhancement. No midline shift, ventriculomegaly or extra-axial fluid collection. Vascular: Please see MRA. Skull and upper cervical spine: Normal marrow signal intensity. No enhancing lesion. Sinuses/Orbits: Normal orbits. Clear paranasal sinuses. No mastoid effusion. Other: None. IMPRESSION: Heterogenously enhancing 1.7 cm right centrum semiovale lesion extending into the corpus callosum body may reflect an active demyelinating focus versus subacute infarct. Short-term interval follow-up of 2-3 months is recommended. 5 mm focus of prior restricted diffusion within the anterior right centrum semiovale does not enhance and likely reflects infarct. Electronically Signed   By: CPrimitivo GauzeM.D.   On: 05/01/2020 16:49   DG  Shoulder Left  Result Date: 04/30/2020 CLINICAL DATA:  Fall with left shoulder pain EXAM: LEFT SHOULDER - 2+ VIEW COMPARISON:  Left shoulder radiographs dated 02/18/2004. FINDINGS: There is no evidence of fracture or dislocation. Degenerative changes are seen in the acromioclavicular joint. Soft tissues are unremarkable.  IMPRESSION: Negative. Electronically Signed   By: Zerita Boers M.D.   On: 04/30/2020 18:18   ECHOCARDIOGRAM COMPLETE BUBBLE STUDY  Result Date: 05/01/2020    ECHOCARDIOGRAM REPORT   Patient Name:   Squire Cherylynn Ridges Date of Exam: 05/01/2020 Medical Rec #:  [id number]      Height:       68.0 in Accession #:    [id number]     Weight:       159.0 lb Date of Birth:  1934/03/12     BSA:          1.854 m Patient Age:    52 years       BP:           112/89 mmHg Patient Gender: M              HR:           80 bpm. Exam Location:  Inpatient Procedure: 2D Echo and Saline Contrast Bubble Study Indications:    Stroke 434.91 / I63.9  History:        Patient has prior history of Echocardiogram examinations, most                 recent 02/29/2016. Stroke; Risk Factors:Hypertension, Diabetes                 and Dyslipidemia. Past history of Afib, cancer, coronary                 disease.  Sonographer:    Darlina Sicilian RDCS Referring Phys: [id number] [place]  1. Left ventricular ejection fraction, by estimation, is 50 to 55%. The left ventricle has mildly decreased function. The left ventricle demonstrates regional wall motion abnormalities (see scoring diagram/findings for description). Left ventricular diastolic parameters are indeterminate. There is severe hypokinesis of the left ventricular, entire inferolateral wall. There is moderate hypokinesis of the left ventricular, basal-mid inferior wall.  2. Right ventricular systolic function is normal. The right ventricular size is normal.  3. Left atrial size was mildly dilated.  4. The mitral valve is normal in structure. Mild mitral valve regurgitation.  5. The aortic valve is tricuspid. Aortic valve regurgitation is not visualized. No aortic stenosis is present.  6. Agitated saline contrast bubble study was negative, with no evidence of any interatrial shunt. Conclusion(s)/Recommendation(s): No intracardiac source of embolism detected on this transthoracic  study. A transesophageal echocardiogram is recommended to exclude cardiac source of embolism if clinically indicated. FINDINGS  Left Ventricle: Left ventricular ejection fraction, by estimation, is 50 to 55%. The left ventricle has mildly decreased function. The left ventricle demonstrates regional wall motion abnormalities. Severe hypokinesis of the left ventricular, entire inferolateral wall. Moderate hypokinesis of the left ventricular, basal-mid inferior wall. The left ventricular internal cavity size was normal in size. There is no left ventricular hypertrophy. Left ventricular diastolic parameters are indeterminate. Right Ventricle: The right ventricular size is normal. No increase in right ventricular wall thickness. Right ventricular systolic function is normal. Left Atrium: Left atrial size was mildly dilated. Right Atrium: Right atrial size was normal in size. Pericardium: There is no evidence of pericardial effusion. Mitral Valve: The mitral valve is normal in  structure. Mild mitral valve regurgitation. Tricuspid Valve: The tricuspid valve is grossly normal. Tricuspid valve regurgitation is trivial. No evidence of tricuspid stenosis. Aortic Valve: The aortic valve is tricuspid. Aortic valve regurgitation is not visualized. No aortic stenosis is present. Pulmonic Valve: The pulmonic valve was not well visualized. Pulmonic valve regurgitation is not visualized. Aorta: The aortic arch was not well visualized and the ascending aorta was not well visualized. Venous: The inferior vena cava was not well visualized. IAS/Shunts: No atrial level shunt detected by color flow Doppler. Agitated saline contrast was given intravenously to evaluate for intracardiac shunting. Agitated saline contrast bubble study was negative, with no evidence of any interatrial shunt.  LEFT VENTRICLE PLAX 2D LVIDd:         3.30 cm  Diastology LVIDs:         2.20 cm  LV e' lateral:   7.88 cm/s LV PW:         1.00 cm  LV E/e' lateral: 7.2  LV IVS:        1.00 cm  LV e' medial:    4.35 cm/s LVOT diam:     1.70 cm  LV E/e' medial:  13.0 LV SV:         39 LV SV Index:   21 LVOT Area:     2.27 cm  RIGHT VENTRICLE RV S prime:     14.80 cm/s TAPSE (M-mode): 2.0 cm LEFT ATRIUM             Index       RIGHT ATRIUM          Index LA diam:        2.80 cm 1.51 cm/m  RA Area:     9.13 cm LA Vol (A2C):   50.0 ml 26.97 ml/m RA Volume:   16.50 ml 8.90 ml/m LA Vol (A4C):   48.8 ml 26.32 ml/m LA Biplane Vol: 52.1 ml 28.10 ml/m  AORTIC VALVE LVOT Vmax:   85.50 cm/s LVOT Vmean:  54.600 cm/s LVOT VTI:    0.170 m  AORTA Ao Root diam: 2.70 cm MITRAL VALVE MV Area (PHT): 3.77 cm    SHUNTS MV Decel Time: 201 msec    Systemic VTI:  0.17 m MV E velocity: 56.60 cm/s  Systemic Diam: 1.70 cm MV A velocity: 93.00 cm/s MV E/A ratio:  0.61 Buford Dresser MD Electronically signed by Buford Dresser MD Signature Date/Time: 05/01/2020/3:44:28 PM    Final    DG Hip Unilat With Pelvis 2-3 Views Left  Result Date: 04/30/2020 CLINICAL DATA:  Patient status post fall.  Leg weakness. EXAM: DG HIP (WITH OR WITHOUT PELVIS) 2-3V LEFT COMPARISON:  None. FINDINGS: Lower lumbar spine degenerative changes. SI joints unremarkable. Pelvic phleboliths. Mild bilateral hip joint degenerative changes. No evidence for acute fracture. IMPRESSION: No acute osseous abnormality. Electronically Signed   By: Lovey Newcomer M.D.   On: 04/30/2020 10:35   DG Hip Unilat W or Wo Pelvis 2-3 Views Right  Result Date: 04/30/2020 CLINICAL DATA:  Fall with hip pain EXAM: DG HIP (WITH OR WITHOUT PELVIS) 2-3V RIGHT COMPARISON:  None. FINDINGS: There is no evidence of hip fracture or dislocation. Degenerative changes are seen in the spine and hips. IMPRESSION: No acute findings. Electronically Signed   By: Zerita Boers M.D.   On: 04/30/2020 18:19   VAS US CAROTID  Result Date: 05/01/2020 Carotid Arterial Duplex Study Indications:       CVA. Risk Factors:  Hypertension, Diabetes, coronary  artery disease. Comparison Study:  04/01/2014- 1-39% ICA stenosis bilaterally. Performing Technologist: Maudry Mayhew MHA, RDMS, RVT, RDCS  Examination Guidelines: A complete evaluation includes B-mode imaging, spectral Doppler, color Doppler, and power Doppler as needed of all accessible portions of each vessel. Bilateral testing is considered an integral part of a complete examination. Limited examinations for reoccurring indications may be performed as noted.  Right Carotid Findings: +----------+--------+--------+--------+-----------------------+--------+           PSV cm/sEDV cm/sStenosisPlaque Description     Comments +----------+--------+--------+--------+-----------------------+--------+ CCA Prox  70      12                                              +----------+--------+--------+--------+-----------------------+--------+ CCA Distal69      13                                              +----------+--------+--------+--------+-----------------------+--------+ ICA Prox  195     46      40-59%  heterogenous and smooth         +----------+--------+--------+--------+-----------------------+--------+ ICA Distal90      22                                              +----------+--------+--------+--------+-----------------------+--------+ ECA       71                                                      +----------+--------+--------+--------+-----------------------+--------+ +----------+--------+-------+----------------+-------------------+           PSV cm/sEDV cmsDescribe        Arm Pressure (mmHG) +----------+--------+-------+----------------+-------------------+ Subclavian105            Multiphasic, WNL                    +----------+--------+-------+----------------+-------------------+ +---------+--------+--+--------+--+---------+ VertebralPSV cm/s56EDV cm/s13Antegrade +---------+--------+--+--------+--+---------+  Left Carotid Findings:  +----------+--------+--------+--------+--------------------------+--------+           PSV cm/sEDV cm/sStenosisPlaque Description        Comments +----------+--------+--------+--------+--------------------------+--------+ CCA Prox  55      10                                                 +----------+--------+--------+--------+--------------------------+--------+ CCA Distal71      16              smooth and heterogenous            +----------+--------+--------+--------+--------------------------+--------+ ICA Prox  113     22                                                 +----------+--------+--------+--------+--------------------------+--------+ ICA Distal89  30              heterogenous and irregular         +----------+--------+--------+--------+--------------------------+--------+ ECA       57      7                                                  +----------+--------+--------+--------+--------------------------+--------+ +----------+--------+--------+----------------+-------------------+           PSV cm/sEDV cm/sDescribe        Arm Pressure (mmHG) +----------+--------+--------+----------------+-------------------+ [id number]              Multiphasic, WNL                    +----------+--------+--------+----------------+-------------------+ +---------+--------+--+--------+--+---------+ VertebralPSV cm/s66EDV cm/s20Antegrade +---------+--------+--+--------+--+---------+   Summary: Right Carotid: Velocities in the right ICA are consistent with a 40-59%                stenosis. Left Carotid: Velocities in the left ICA are consistent with a 1-39% stenosis. Vertebrals:  Bilateral vertebral arteries demonstrate antegrade flow. Subclavians: Normal flow hemodynamics were seen in bilateral subclavian              arteries. *See table(s) above for measurements and observations.  Electronically signed by Antony Contras MD on 05/01/2020 at 2:21:54 PM.     Final      Assessment/Plan: 86yrold man with Afib, on Xarelto, CAD, s/p stent, CHF, DM2, HTN, GERD, HLD, Prostate and bone cancer. On 8/14 he presnted to ER after a fall. Family at bedside state he has been in general decline for last 2 months with poor appetite, weight loss and general weakness. MRI brain shows lesions in the Rt parietal area and R corpus callosum. His clinical course does not fit acute or subacute stroke. High suspicion this is an underlying lesion/mass given h/o cancer and slow decline over months, despite radiology read do not expect demyelinating lesions in this age group and the morphology is atypical for stroke.  Moreover his exam matches, with bilateral hip flexion weakness that is more likely secondary to his malignancy, but left lower extremity weakness that fits with the right parietal lesion.  1. Abnormal brain imaging- findings suspicious for brain mass/lesion, not clearly stroke. Need MRI with gad to better evaluate. Will also look at CT C/A/P for malignancy.  2. Generalized weakness and overall decline with poor appetite/wt loss- as above wk up for malignancy 3. Afib- await further wk up on brain lesion before restarting DOAC as NSGY or other biopsies may be indicated.  Given likelihood of hypercoagulability in the setting of malignancy, would recommend heparin drip 4. H/o prostate and bone cancer 5. HTN 6. DM2 7. CKD3 8. Fall  RECS: MRI w/gad CT chest/Abd/Pelvis Further recs pending above Meanwhile max med mgt Rehab evals Updated with pts dtr at bedside. She is agreeable to above plan.   Attending neurologist's note to follow  Desiree Metzger-Cihelka, ARNP-C, ANVP-BC Pager: [phone] Attending Neurologist's note:  I personally saw this patient, gathering history, performing a full neurologic examination, reviewing relevant labs, personally reviewing relevant imaging including MRI brain.  And formulated the assessment and plan, adding the note above  for completeness and clarity to accurately reflect my thoughts   In addition, I would recommend a heparin drip for anticoagulation pending further work-up. If primary team is  in agreement would appreciate primary team ordering this; this has not yet been discussed with the daughter.    Lesleigh Noe MD-PhD [facility] [phone]

## 2020-05-01 NOTE — Progress Notes (Signed)
PROGRESS NOTE    ANTOIN DARGIS  DJT:[id number] DOB: 06-Mar-1934 DOA: 04/30/2020 PCP: [name], MD    Brief Narrative:  86 year old male with past medical history of paroxysmal atrial fibrillation (on Xarelto), coronary disease (s/p PCI with stent to circumflex), diastolic congestive heart failure (last echo 02/2016 EF 45-50%), diabetes mellitus type 2, hypertension, gastroesophageal reflux disease, prostate cancer, hyperlipidemia who presents to [facility] emergency department status post fall.  History is been obtained from both patient and the daughter who is at the bedside.  Patient explains that for the past 2 months he has been experiencing progressively worsening weakness.  Weakness is generalized but has been gradually become more and more severe over the span of time.  Patient also complains of some occasional intermittent left-sided chest discomfort although he states that this is not uncommon considering his history of coronary artery disease.  Patient denies dyspnea on exertion.  Patient does also endorse poor appetite over the same span of time with a 15 pound weight loss.  Patient is also complaining of a productive cough with clear sputum over the span of time but denies fevers, recent travel, sick contacts or confirmed contact with known COVID-19 infection.  Of note, patient has undergone COVID-19 vaccination with shots completed in early June and early July.  Patient's weakness continue to progress resulting in him falling in his backyard several weeks ago.  Yesterday evening, as the patient was attempting to get up off the toilet after using the bathroom he relates did not have the strength to completely rise from a seated position and fell to the floor.  The patient was too weak to get up, even with his wife attempting to help him throughout the evening.  The patient remained lying on the floor for essentially the entire evening until the patient's daughter came  to assist him the following morning.  Patient denies any loss of consciousness or head trauma with this fall.  According to the daughter who is at the bedside, she also noticed that the patient was having difficulty moving his left side shortly after arriving to help him.  She also endorses new onset slurring of his words since she found him this morning.  Upon evaluation in the emergency department, patient is complaining of moderate to severe sore musculoskeletal pain of the left chest wall, bilateral hips and legs.  X-rays of all of these areas is negative for fracture.  500 cc bolus was provided of isotonic fluids.  Due to the patient's progressive weakness and fall the hospitalist group has been called to assess patient for admission to the hospital.   Assessment & Plan:   Principal Problem:   Fall at home, initial encounter Active Problems:   Essential hypertension   Uncontrolled type 2 diabetes mellitus with diabetic polyneuropathy, without long-term current use of insulin (HCC)   PAF (paroxysmal atrial fibrillation) (HCC)   Atherosclerosis of native coronary artery of native heart with angina pectoris ([place])   Primary malignant neoplasm of prostate metastatic to bone [facility])   Generalized weakness   Acute on chronic anemia   Elevated CK   Chronic kidney disease, stage 3a   Elevated troponin level not due myocardial infarction  Principal Problem:   Fall at home, initial encounter, likely secondary to acute CVA  MRI personally reviewed. Findings of 53mm acute ischemic R frontal centrum semi ovale infarct as well as 1.5-2cm lesion involving R corpus callosum, likely evolving subactue infarct, see below  PT/OT/SLP consulted  Pt has hx afib, presented with hgb 7.7 with no evidence of acute blood loss. Home xarelto held and ASA started instead while ensuring no active blood loss at the moment  Have consulted Neurology, will f/u on recs  2d echo performed, pending results  Carotid  dopplers ordered and reviewed, findings of 40-59% R ICA stenosis  F/u with neurology regarding anticoagulation/anticoagulation  Active Problems:   Generalized weakness  PT/OT consulted    Essential hypertension  Hold ramapril to allow more permissive HTN given acute CVA  Continuing metoprolol for rate control given hx afib    Uncontrolled type 2 diabetes mellitus with diabetic polyneuropathy, without long-term current use of insulin (HCC)  Accu-Cheks before every meal and nightly with sliding scale insulin  Holding home regimen of oral hypoglycemics  Glucose trends thus far stable    PAF (paroxysmal atrial fibrillation) (HCC)  Continue home regimen of AV nodal blocking therapy  Patient is seemingly currently rate controlled  Monitoring patient on telemetry for now  Elevated troponin not due to myocardial infarction  Slightly elevated troponin without notable EKG changes and atypical reproducible chest discomfort  Patient is unlikely to be suffering from plaque rupture  Chest discomfort likely secondary to recent fall  Stable at present  Acute on chronic anemia, microcytic  Holding Xarelto  Stool neg for blood  Iron studies pending  Chart reviewed. Pt is s/p colonoscopy 2011 with finding of benign polyp with recommendation for repeat screening colon 2021    Elevated CK  Slightly elevated creatinine kinase  Hydrate patient with intravenous isotonic fluids to avoid rhabdomyolysis  Performing serial creatine kinase levels to ensure downtrending and resolution.  Repeat bmet in AM    Chronic kidney disease, stage 3a  Creatinine is essentially at baseline  Strict input and output monitoring  Cont to avoid nephrotoxic agents  Repeat bmet in AM    Primary malignant neoplasm of prostate metastatic to bone [facility])  Outpatient follow-up  DVT prophylaxis: SCD's Code Status: Full Family Communication: Pt in room, family at  bedside  Status is: Observation  The patient will require care spanning > 2 midnights and should be moved to inpatient because: Ongoing diagnostic testing needed not appropriate for outpatient work up, Unsafe d/c plan and Inpatient level of care appropriate due to severity of illness  Dispo: The patient is from: Home              Anticipated d/c is to: SNF              Anticipated d/c date is: 2 days              Patient currently is not medically stable to d/c.  Consultants:   Neurology  Procedures:     Antimicrobials: Anti-infectives (From admission, onward)   None       Subjective: Still feeling weak  Objective: Vitals:   05/01/20 0024 05/01/20 0419 05/01/20 0845 05/01/20 1152  BP: (!) 156/70 (!) 166/82 (!) 149/70 112/89  Pulse: 70 71 80 80  Resp: 18 18 18 18   Temp: 98 F (36.7 C) 98.3 F (36.8 C) 97.6 F (36.4 C) 97.6 F (36.4 C)  TempSrc:  Oral Oral Oral  SpO2: 100% 98% 97% 98%  Weight:  72.1 kg      Intake/Output Summary (Last 24 hours) at 05/01/2020 1600 Last data filed at 05/01/2020 1400 Gross per 24 hour  Intake 1665 ml  Output 550 ml  Net 1115 ml   [employer]  04/30/20 2143 05/01/20 0419  Weight: 71.7 kg 72.1 kg    Examination:  General exam: Appears calm and comfortable  Respiratory system: Clear to auscultation. Respiratory effort normal. Cardiovascular system: S1 & S2 heard, Regular Gastrointestinal system: Abdomen is nondistended, soft and nontender. No organomegaly or masses felt. Normal bowel sounds heard. Central nervous system: Alert and oriented. No focal neurological deficits. Extremities: Symmetric 5 x 5 power. Skin: No rashes, lesions Psychiatry: Judgement and insight appear normal. Mood & affect appropriate.   Data Reviewed: I have personally reviewed following labs and imaging studies  CBC: Recent Labs  Lab 04/29/20 1218 04/30/20 1637 05/01/20 0059  WBC 6.6 10.2 8.1  NEUTROABS  --  7.5 6.0  HGB 8.5 Repeated and  verified X2.* 8.1* 7.7*  HCT 26.7 Repeated and verified X2.* 27.9* 24.9*  MCV 75.0* 79.3* 75.9*  PLT 224.0 229 161   Basic Metabolic Panel: Recent Labs  Lab 04/29/20 1218 04/30/20 1637 05/01/20 0059  NA 136 138 138  K 4.7 3.4* 3.2*  CL 101 104 106  CO2 25 22 22   GLUCOSE 245* 98 126*  BUN 20 25* 21  CREATININE 1.07 1.32* 1.00  CALCIUM 8.2* 8.3* 7.7*  MG  --   --  2.0   GFR: Estimated Creatinine Clearance: 54.2 mL/min (by C-G formula based on SCr of 1 mg/dL). Liver Function Tests: Recent Labs  Lab 04/29/20 1218 05/01/20 0059  AST 21 53*  ALT 7 18  ALKPHOS 183* 159*  BILITOT 0.5 0.7  PROT 6.5 6.6  ALBUMIN 2.9* 2.0*   No results for input(s): LIPASE, AMYLASE in the last 168 hours. No results for input(s): AMMONIA in the last 168 hours. Coagulation Profile: Recent Labs  Lab 04/30/20 1930  INR 1.4*   Cardiac Enzymes: Recent Labs  Lab 04/30/20 1637 05/01/20 0059  CKTOTAL 981* 717*   BNP (last 3 results) No results for input(s): PROBNP in the last 8760 hours. HbA1C: Recent Labs    04/29/20 1218 04/30/20 1637  HGBA1C 7.9* 7.5*   CBG: Recent Labs  Lab 04/30/20 2123 05/01/20 0606 05/01/20 0734 05/01/20 1155  GLUCAP 60* 57* 151* 223*   Lipid Profile: No results for input(s): CHOL, HDL, LDLCALC, TRIG, CHOLHDL, LDLDIRECT in the last 72 hours. Thyroid Function Tests: Recent Labs    04/29/20 1218 04/30/20 1930  TSH  --  1.164  FREET4 0.98  --    Anemia Panel: Recent Labs    04/29/20 1218 04/30/20 1930 05/01/20 0103  VITAMINB12 >1526* 1,350*  --   FOLATE  --  37.5  --   TIBC  --   --  238*  IRON  --   --  13*   Sepsis Labs: Recent Labs  Lab 04/30/20 1930  LATICACIDVEN 1.2    Recent Results (from the past 240 hour(s))  SARS Coronavirus 2 by RT PCR (hospital order, performed in [facility] hospital lab) Nasopharyngeal Nasopharyngeal Swab     Status: None   Collection Time: 04/30/20  7:30 PM   Specimen: Nasopharyngeal Swab  Result Value  Ref Range Status   SARS Coronavirus 2 NEGATIVE NEGATIVE Final    Comment: (NOTE) SARS-CoV-2 target nucleic acids are NOT DETECTED.  The SARS-CoV-2 RNA is generally detectable in upper and lower respiratory specimens during the acute phase of infection. The lowest concentration of SARS-CoV-2 viral copies this assay can detect is 250 copies / mL. A negative result does not preclude SARS-CoV-2 infection and should not be used as the sole basis for treatment  or other patient management decisions.  A negative result may occur with improper specimen collection / handling, submission of specimen other than nasopharyngeal swab, presence of viral mutation(s) within the areas targeted by this assay, and inadequate number of viral copies (<250 copies / mL). A negative result must be combined with clinical observations, patient history, and epidemiological information.  Fact Sheet for Patients:   StrictlyIdeas.no  Fact Sheet for Healthcare Providers: BankingDealers.co.za  This test is not yet approved or  cleared by the [place] FDA and has been authorized for detection and/or diagnosis of SARS-CoV-2 by FDA under an Emergency Use Authorization (EUA).  This EUA will remain in effect (meaning this test can be used) for the duration of the COVID-19 declaration under Section 564(b)(1) of the Act, 21 U.S.C. section 360bbb-3(b)(1), unless the authorization is terminated or revoked sooner.  Performed at [facility], [place] [address] [place], [place] [address]   Urine Culture     Status: Abnormal   Collection Time: 04/30/20  7:30 PM   Specimen: Urine, Random  Result Value Ref Range Status   Specimen Description URINE, RANDOM  Final   Special Requests NONE  Final   Culture (A)  Final    <10,000 COLONIES/mL INSIGNIFICANT GROWTH Performed at [facility], [place] [address] [place] [place], [place] [address]    Report Status 05/01/2020 FINAL   Final     Radiology Studies: EEG  Result Date: 05/01/2020 Lora Havens, MD     05/01/2020  3:33 PM Patient Name: DERRON PIPKINS MRN: [id number] Epilepsy Attending: Lora Havens Referring Physician/Provider: Dr Lesleigh Noe Date: 05/01/2020 Duration: 23.22 mins Patient history: 86yo M s/p fall and left sided weakness. EEG to evaluate for seizure Level of alertness: Awake, assleep AEDs during EEG study: None Technical aspects: This EEG study was done with scalp electrodes positioned according to the 10-20 International system of electrode placement. Electrical activity was acquired at a sampling rate of 500Hz  and reviewed with a high frequency filter of 70Hz  and a low frequency filter of 1Hz . EEG data were recorded continuously and digitally stored. Description: The posterior dominant rhythm consists of 7.5 Hz activity of moderate voltage (25-35 uV) seen predominantly in posterior head regions, symmetric and reactive to eye opening and eye closing.  Sleep was characterized by vertex waves, sleep spindles (12 to 14 Hz), maximal frontocentral region. EEG showed /intermittent generalized 3 to 6 Hz theta-delta slowing. Hyperventilation and photic stimulation were not performed.   ABNORMALITY -Intermittent slow, generalized IMPESSION: This study is suggestive of mild diffuse encephalopathy, nonspecific etiology. No seizures or epileptiform discharges were seen throughout the recording. Lora Havens   DG Chest 1 View  Result Date: 04/30/2020 CLINICAL DATA:  Pneumonia, history of AFib EXAM: CHEST  1 VIEW COMPARISON:  None. FINDINGS: The heart size and mediastinal contours are within normal limits. Both lungs are clear. The visualized skeletal structures are unremarkable. There is a small hiatal hernia. IMPRESSION: No active disease. Electronically Signed   By: Prudencio Pair M.D.   On: 04/30/2020 21:08   DG Ribs Unilateral W/Chest Right  Result Date: 04/30/2020 CLINICAL DATA:  Fall EXAM: RIGHT RIBS  AND CHEST - 3+ VIEW COMPARISON:  Chest radiograph dated 02/28/2016 FINDINGS: No fracture or other bone lesions are seen involving the ribs. There is no evidence of pneumothorax or pleural effusion. Both lungs are clear. Heart size and mediastinal contours are within normal limits. IMPRESSION: Negative. Electronically Signed   By: Zerita Boers  M.D.   On: 04/30/2020 18:14   DG Lumbar Spine Complete  Result Date: 04/30/2020 CLINICAL DATA:  Fall with back pain EXAM: LUMBAR SPINE - COMPLETE 4+ VIEW COMPARISON:  None. FINDINGS: There is no evidence of lumbar spine fracture. There is lumbar dextrocurvature. Moderate to severe degenerative changes are seen in the lumbar spine. IMPRESSION: Negative for acute fracture. Electronically Signed   By: Zerita Boers M.D.   On: 04/30/2020 18:16   CT Head Wo Contrast  Result Date: 04/30/2020 CLINICAL DATA:  Status post fall. EXAM: CT HEAD WITHOUT CONTRAST TECHNIQUE: Contiguous axial images were obtained from the base of the skull through the vertex without intravenous contrast. COMPARISON:  March 31, 2014 FINDINGS: Brain: There is mild cerebral atrophy with widening of the extra-axial spaces and ventricular dilatation. There are areas of decreased attenuation within the white matter tracts of the supratentorial brain, consistent with microvascular disease changes. Vascular: No hyperdense vessel or unexpected calcification. Skull: Normal. Negative for fracture or focal lesion. Sinuses/Orbits: No acute finding. Other: None. IMPRESSION: 1. Generalized cerebral atrophy. 2. No acute intracranial abnormality. Electronically Signed   By: Virgina Norfolk M.D.   On: 04/30/2020 20:54   MR BRAIN WO CONTRAST  Result Date: 05/01/2020 CLINICAL DATA:  Initial evaluation for fall, left-sided weakness and slurred speech. EXAM: MRI HEAD WITHOUT CONTRAST TECHNIQUE: Multiplanar, multiecho pulse sequences of the brain and surrounding structures were obtained without intravenous contrast.  COMPARISON:  Prior head CT from 04/30/2020. FINDINGS: Brain: Generalized age appropriate cerebral volume loss. No significant cerebral white matter disease for age. Few small foci of encephalomalacia and gliosis involving the right frontal parietal cortices consistent with chronic right MCA territory infarcts (series 11, images 22, 20). 5 mm focus of restricted diffusion involving the right frontal centrum semi ovale consistent with a small acute ischemic infarct (series 5, image 85). No associated hemorrhage or mass effect. Additionally, there is a round well-circumscribed ovoid T2/FLAIR hyperintense lesion measuring 1.8 x 1.6 x 2.0 cm (series 10, image 19). Inferior extension to involve the right aspect of the body of the corpus callosum. Mild localized mass effect on the subjacent right lateral ventricle (series 9, image 10). Associated diffusion signal abnormality without frank restriction or ADC correlate. No associated susceptibility artifact to suggest hemorrhage. Findings indeterminate, but favored to reflect an evolving subacute ischemic infarct. Sequelae of demyelination or possibly tumor could also be considered. No significant surrounding edema or FLAIR signal intensity. No other evidence for acute or subacute ischemia. Gray-white matter differentiation otherwise maintained. No foci of susceptibility artifact to suggest acute or chronic intracranial hemorrhage. No other mass lesion, midline shift or mass effect. No hydrocephalus or extra-axial fluid collection. Pituitary gland suprasellar region within normal limits. Midline structures intact. Vascular: Major intracranial vascular flow voids maintained. Skull and upper cervical spine: Craniocervical junction within normal limits. Degenerative spondylosis at C3-4 with resultant mild-to-moderate spinal stenosis. Subcentimeter T1 hypointense lesion within the C3 vertebral body likely degenerative. Bone marrow signal intensity within normal limits. No scalp  soft tissue abnormality. Sinuses/Orbits: Globes and orbital soft tissues within normal limits. Mild scattered mucosal thickening noted within the ethmoidal air cells. Paranasal sinuses are otherwise clear. Trace right mastoid effusion noted, of doubtful significance. Inner ear structures grossly normal. Other: None. IMPRESSION: 1. 5 mm acute ischemic nonhemorrhagic infarct involving the right frontal centrum semi ovale. 2. 1.8 x 1.6 x 2.0 cm ovoid T2/FLAIR hyperintense lesion involving the posterior right frontal white matter and right corpus callosum, indeterminate, but favored to reflect an  evolving subacute ischemic infarct. Sequelae of demyelination or possibly tumor could also be considered. Further assessment with postcontrast imaging recommended for complete characterization. Additionally, a short interval follow-up MRI in 2-3 months would likely be helpful for further evaluation as well. 3. Few small chronic right MCA territory infarcts involving the right frontoparietal cortices as above. 4. Otherwise normal brain MRI for age. Electronically Signed   By: Jeannine Boga M.D.   On: 05/01/2020 01:37   DG Shoulder Left  Result Date: 04/30/2020 CLINICAL DATA:  Fall with left shoulder pain EXAM: LEFT SHOULDER - 2+ VIEW COMPARISON:  Left shoulder radiographs dated 02/18/2004. FINDINGS: There is no evidence of fracture or dislocation. Degenerative changes are seen in the acromioclavicular joint. Soft tissues are unremarkable. IMPRESSION: Negative. Electronically Signed   By: Zerita Boers M.D.   On: 04/30/2020 18:18   ECHOCARDIOGRAM COMPLETE BUBBLE STUDY  Result Date: 05/01/2020    ECHOCARDIOGRAM REPORT   Patient Name:   Trust Cherylynn Ridges Date of Exam: 05/01/2020 Medical Rec #:  [id number]      Height:       68.0 in Accession #:    [id number]     Weight:       159.0 lb Date of Birth:  08/20/1934     BSA:          1.854 m Patient Age:    39 years       BP:           112/89 mmHg Patient Gender: M               HR:           80 bpm. Exam Location:  Inpatient Procedure: 2D Echo and Saline Contrast Bubble Study Indications:    Stroke 434.91 / I63.9  History:        Patient has prior history of Echocardiogram examinations, most                 recent 02/29/2016. Stroke; Risk Factors:Hypertension, Diabetes                 and Dyslipidemia. Past history of Afib, cancer, coronary                 disease.  Sonographer:    Darlina Sicilian RDCS Referring Phys: [id number] [place]  1. Left ventricular ejection fraction, by estimation, is 50 to 55%. The left ventricle has mildly decreased function. The left ventricle demonstrates regional wall motion abnormalities (see scoring diagram/findings for description). Left ventricular diastolic parameters are indeterminate. There is severe hypokinesis of the left ventricular, entire inferolateral wall. There is moderate hypokinesis of the left ventricular, basal-mid inferior wall.  2. Right ventricular systolic function is normal. The right ventricular size is normal.  3. Left atrial size was mildly dilated.  4. The mitral valve is normal in structure. Mild mitral valve regurgitation.  5. The aortic valve is tricuspid. Aortic valve regurgitation is not visualized. No aortic stenosis is present.  6. Agitated saline contrast bubble study was negative, with no evidence of any interatrial shunt. Conclusion(s)/Recommendation(s): No intracardiac source of embolism detected on this transthoracic study. A transesophageal echocardiogram is recommended to exclude cardiac source of embolism if clinically indicated. FINDINGS  Left Ventricle: Left ventricular ejection fraction, by estimation, is 50 to 55%. The left ventricle has mildly decreased function. The left ventricle demonstrates regional wall motion abnormalities. Severe hypokinesis of the left ventricular, entire inferolateral wall. Moderate hypokinesis of the left ventricular,  basal-mid inferior wall. The left ventricular  internal cavity size was normal in size. There is no left ventricular hypertrophy. Left ventricular diastolic parameters are indeterminate. Right Ventricle: The right ventricular size is normal. No increase in right ventricular wall thickness. Right ventricular systolic function is normal. Left Atrium: Left atrial size was mildly dilated. Right Atrium: Right atrial size was normal in size. Pericardium: There is no evidence of pericardial effusion. Mitral Valve: The mitral valve is normal in structure. Mild mitral valve regurgitation. Tricuspid Valve: The tricuspid valve is grossly normal. Tricuspid valve regurgitation is trivial. No evidence of tricuspid stenosis. Aortic Valve: The aortic valve is tricuspid. Aortic valve regurgitation is not visualized. No aortic stenosis is present. Pulmonic Valve: The pulmonic valve was not well visualized. Pulmonic valve regurgitation is not visualized. Aorta: The aortic arch was not well visualized and the ascending aorta was not well visualized. Venous: The inferior vena cava was not well visualized. IAS/Shunts: No atrial level shunt detected by color flow Doppler. Agitated saline contrast was given intravenously to evaluate for intracardiac shunting. Agitated saline contrast bubble study was negative, with no evidence of any interatrial shunt.  LEFT VENTRICLE PLAX 2D LVIDd:         3.30 cm  Diastology LVIDs:         2.20 cm  LV e' lateral:   7.88 cm/s LV PW:         1.00 cm  LV E/e' lateral: 7.2 LV IVS:        1.00 cm  LV e' medial:    4.35 cm/s LVOT diam:     1.70 cm  LV E/e' medial:  13.0 LV SV:         39 LV SV Index:   21 LVOT Area:     2.27 cm  RIGHT VENTRICLE RV S prime:     14.80 cm/s TAPSE (M-mode): 2.0 cm LEFT ATRIUM             Index       RIGHT ATRIUM          Index LA diam:        2.80 cm 1.51 cm/m  RA Area:     9.13 cm LA Vol (A2C):   50.0 ml 26.97 ml/m RA Volume:   16.50 ml 8.90 ml/m LA Vol (A4C):   48.8 ml 26.32 ml/m LA Biplane Vol: 52.1 ml 28.10 ml/m   AORTIC VALVE LVOT Vmax:   85.50 cm/s LVOT Vmean:  54.600 cm/s LVOT VTI:    0.170 m  AORTA Ao Root diam: 2.70 cm MITRAL VALVE MV Area (PHT): 3.77 cm    SHUNTS MV Decel Time: 201 msec    Systemic VTI:  0.17 m MV E velocity: 56.60 cm/s  Systemic Diam: 1.70 cm MV A velocity: 93.00 cm/s MV E/A ratio:  0.61 Buford Dresser MD Electronically signed by Buford Dresser MD Signature Date/Time: 05/01/2020/3:44:28 PM    Final    DG Hip Unilat With Pelvis 2-3 Views Left  Result Date: 04/30/2020 CLINICAL DATA:  Patient status post fall.  Leg weakness. EXAM: DG HIP (WITH OR WITHOUT PELVIS) 2-3V LEFT COMPARISON:  None. FINDINGS: Lower lumbar spine degenerative changes. SI joints unremarkable. Pelvic phleboliths. Mild bilateral hip joint degenerative changes. No evidence for acute fracture. IMPRESSION: No acute osseous abnormality. Electronically Signed   By: Lovey Newcomer M.D.   On: 04/30/2020 10:35   DG Hip Unilat W or Wo Pelvis 2-3 Views Right  Result Date: 04/30/2020 CLINICAL DATA:  Fall with hip pain EXAM: DG HIP (WITH OR WITHOUT PELVIS) 2-3V RIGHT COMPARISON:  None. FINDINGS: There is no evidence of hip fracture or dislocation. Degenerative changes are seen in the spine and hips. IMPRESSION: No acute findings. Electronically Signed   By: Zerita Boers M.D.   On: 04/30/2020 18:19   VAS US CAROTID  Result Date: 05/01/2020 Carotid Arterial Duplex Study Indications:       CVA. Risk Factors:      Hypertension, Diabetes, coronary artery disease. Comparison Study:  04/01/2014- 1-39% ICA stenosis bilaterally. Performing Technologist: Maudry Mayhew MHA, RDMS, RVT, RDCS  Examination Guidelines: A complete evaluation includes B-mode imaging, spectral Doppler, color Doppler, and power Doppler as needed of all accessible portions of each vessel. Bilateral testing is considered an integral part of a complete examination. Limited examinations for reoccurring indications may be performed as noted.  Right Carotid  Findings: +----------+--------+--------+--------+-----------------------+--------+           PSV cm/sEDV cm/sStenosisPlaque Description     Comments +----------+--------+--------+--------+-----------------------+--------+ CCA Prox  70      12                                              +----------+--------+--------+--------+-----------------------+--------+ CCA Distal69      13                                              +----------+--------+--------+--------+-----------------------+--------+ ICA Prox  195     46      40-59%  heterogenous and smooth         +----------+--------+--------+--------+-----------------------+--------+ ICA Distal90      22                                              +----------+--------+--------+--------+-----------------------+--------+ ECA       71                                                      +----------+--------+--------+--------+-----------------------+--------+ +----------+--------+-------+----------------+-------------------+           PSV cm/sEDV cmsDescribe        Arm Pressure (mmHG) +----------+--------+-------+----------------+-------------------+ Subclavian105            Multiphasic, WNL                    +----------+--------+-------+----------------+-------------------+ +---------+--------+--+--------+--+---------+ VertebralPSV cm/s56EDV cm/s13Antegrade +---------+--------+--+--------+--+---------+  Left Carotid Findings: +----------+--------+--------+--------+--------------------------+--------+           PSV cm/sEDV cm/sStenosisPlaque Description        Comments +----------+--------+--------+--------+--------------------------+--------+ CCA Prox  55      10                                                 +----------+--------+--------+--------+--------------------------+--------+ CCA Distal71      16  smooth and heterogenous             +----------+--------+--------+--------+--------------------------+--------+ ICA Prox  113     22                                                 +----------+--------+--------+--------+--------------------------+--------+ ICA Distal89      30              heterogenous and irregular         +----------+--------+--------+--------+--------------------------+--------+ ECA       57      7                                                  +----------+--------+--------+--------+--------------------------+--------+ +----------+--------+--------+----------------+-------------------+           PSV cm/sEDV cm/sDescribe        Arm Pressure (mmHG) +----------+--------+--------+----------------+-------------------+ [id number]              Multiphasic, WNL                    +----------+--------+--------+----------------+-------------------+ +---------+--------+--+--------+--+---------+ VertebralPSV cm/s66EDV cm/s20Antegrade +---------+--------+--+--------+--+---------+   Summary: Right Carotid: Velocities in the right ICA are consistent with a 40-59%                stenosis. Left Carotid: Velocities in the left ICA are consistent with a 1-39% stenosis. Vertebrals:  Bilateral vertebral arteries demonstrate antegrade flow. Subclavians: Normal flow hemodynamics were seen in bilateral subclavian              arteries. *See table(s) above for measurements and observations.  Electronically signed by Antony Contras MD on 05/01/2020 at 2:21:54 PM.    Final     Scheduled Meds: . aspirin EC  81 mg Oral Daily  . atorvastatin  20 mg Oral Daily  . feeding supplement (ENSURE ENLIVE)  237 mL Oral BID BM  . insulin aspart  0-15 Units Subcutaneous TID AC & HS  . metoprolol succinate  25 mg Oral QHS  . ramipril  10 mg Oral Daily  . tamsulosin  0.4 mg Oral QPC supper   Continuous Infusions: . sodium chloride 75 mL/hr at 05/01/20 0105     LOS: 0 days   Marylu Lund, MD [facility] Pager  On Amion  If 7PM-7AM, please contact night-coverage 05/01/2020, 4:00 PM

## 2020-05-01 NOTE — Progress Notes (Signed)
EEG complete - results pending 

## 2020-05-01 NOTE — Progress Notes (Signed)
Pt is halfway into 1st bottle of oral contrast with tolerance. Daughter at the bedside for added Support.

## 2020-05-01 NOTE — Evaluation (Signed)
Physical Therapy Evaluation Patient Details Name: Adrian Singh MRN: [id number] DOB: Nov 14, 1933 Today's Date: 05/01/2020   History of Present Illness  Pt is an 86 yo male presenting s/p fall with L sided weakness and slurred speech. CXR 8/15 without active disease. MRI showed acute infarct involving the R frontal centrum semi ovale as well as a hyperintense lesion involving the posterior R frontal Ayodele Sangalang matter and R corpus callosum favored to reflect subacute ischemic infarct. PMH includes: HTN, DM, stroke, prostate ca, skin ca, GER, esophageal dilation, CAD, afib   Clinical Impression  Pt admitted with above diagnosis.Pt was able to transfer to recliner with min assist of 2 persons. Pt very weak and has had several falls in the last few months per daughter. Wife cannot care for pt in current condition as she has fallen herself. Will need sNF for rehab.   Pt currently with functional limitations due to the deficits listed below (see PT Problem List). Pt will benefit from skilled PT to increase their independence and safety with mobility to allow discharge to the venue listed below.    Follow Up Recommendations SNF;Supervision/Assistance - 24 hour    Equipment Recommendations  Rolling walker with 5" wheels    Recommendations for Other Services       Precautions / Restrictions Precautions Precautions: Fall Restrictions Weight Bearing Restrictions: No      Mobility  Bed Mobility Overal bed mobility: Needs Assistance Bed Mobility: Supine to Sit     Supine to sit: Min assist;+2 for safety/equipment     General bed mobility comments: Assist for LEs to initiate to EOB and for elevation of trunk.   Transfers Overall transfer level: Needs assistance Equipment used: Rolling walker (2 wheeled) Transfers: Sit to/from [facility] Sit to Stand: Min assist;+2 safety/equipment;From elevated surface Stand pivot transfers: Min assist;+2 safety/equipment;From elevated  surface       General transfer comment: Pt needed assistt to power up and to steady once up. Pt also needed min assist and cues to stand and pivot to chair with slight posterior lean and flexed posture. Was able to take pivotal steps to chair with use of RW.    Ambulation/Gait             General Gait Details: unable today  Stairs            Wheelchair Mobility    Modified Rankin (Stroke Patients Only)       Balance Overall balance assessment: Needs assistance Sitting-balance support: No upper extremity supported;Feet supported Sitting balance-Leahy Scale: Fair     Standing balance support: Bilateral upper extremity supported;During functional activity Standing balance-Leahy Scale: Poor Standing balance comment: relies on UE ssupport and external support.                              Pertinent Vitals/Pain Pain Assessment: No/denies pain Faces Pain Scale: Hurts a little bit Pain Location: grimacing while swallowing, endorses odynophagia Pain Descriptors / Indicators: Sore Pain Intervention(s): Limited activity within patient's tolerance;Monitored during session    Home Living Family/patient expects to be discharged to:: Private residence Living Arrangements: Spouse/significant other Available Help at Discharge: Family;Available PRN/intermittently Type of Home: House Home Access: Stairs to enter Entrance Stairs-Rails: None Entrance Stairs-Number of Steps: 2 Home Layout: One level Home Equipment: Walker - 2 wheels;Cane - single point;Cane - quad;Bedside commode;Shower seat      Prior Function Level of Independence: Needs assistance   Gait /  Transfers Assistance Needed: 2 canes ambulation but stedy decline over last few months  ADL's / Homemaking Assistance Needed: cant do lower body dressing        Hand Dominance   Dominant Hand: Right    Extremity/Trunk Assessment   Upper Extremity Assessment Upper Extremity Assessment: Defer to OT  evaluation    Lower Extremity Assessment Lower Extremity Assessment: Generalized weakness    Cervical / Trunk Assessment Cervical / Trunk Assessment: Kyphotic  Communication   Communication: No difficulties  Cognition Arousal/Alertness: Awake/alert Behavior During Therapy: Flat affect Overall Cognitive Status: Within Functional Limits for tasks assessed                                        General Comments General comments (skin integrity, edema, etc.): nurse came in and placed a dressing on his upper back where he had a small wound due to pressure of spine.     Exercises General Exercises - Lower Extremity Ankle Circles/Pumps: AROM;Both;10 reps;Seated Long Arc Quad: AROM;Both;10 reps;Seated   Assessment/Plan    PT Assessment Patient needs continued PT services  PT Problem List Decreased activity tolerance;Decreased balance;Decreased mobility;Decreased knowledge of use of DME;Decreased safety awareness;Decreased knowledge of precautions;Decreased strength       PT Treatment Interventions DME instruction;Gait training;Functional mobility training;Therapeutic activities;Therapeutic exercise;Balance training;Patient/family education    PT Goals (Current goals can be found in the Care Plan section)  Acute Rehab PT Goals Patient Stated Goal: to get better PT Goal Formulation: With patient Time For Goal Achievement: 05/15/20 Potential to Achieve Goals: Good    Frequency Min 2X/week   Barriers to discharge Decreased caregiver support (wife cant assist, family works)      [employer] PT "6 Clicks" Mobility  Outcome Measure Help needed turning from your back to your side while in a flat bed without using bedrails?: A Little Help needed moving from lying on your back to sitting on the side of a flat bed without using bedrails?: A Little Help needed moving to and from a bed to a chair (including a wheelchair)?: A Lot Help  needed standing up from a chair using your arms (e.g., wheelchair or bedside chair)?: A Lot Help needed to walk in hospital room?: Total Help needed climbing 3-5 steps with a railing? : Total 6 Click Score: 12    End of Session Equipment Utilized During Treatment: Gait belt Activity Tolerance: Patient tolerated treatment well;Patient limited by fatigue Patient left: in chair;with call bell/phone within reach;with chair alarm set;with family/visitor present Nurse Communication: Mobility status PT Visit Diagnosis: Unsteadiness on feet (R26.81);Muscle weakness (generalized) (M62.81);History of falling (Z91.81)    Time: 1031-1101 PT Time Calculation (min) (ACUTE ONLY): 30 min   Charges:   PT Evaluation $PT Eval Moderate Complexity: 1 Mod PT Treatments $Gait Training: 8-22 mins        Toivo Bordon W,PT Acute Rehabilitation Services Pager:  [phone]  Office:  [place] 05/01/2020, 2:30 PM

## 2020-05-01 NOTE — Progress Notes (Signed)
  Echocardiogram 2D Echocardiogram with bubble study has been performed.  Darlina Sicilian M 05/01/2020, 1:44 PM

## 2020-05-01 NOTE — Procedures (Signed)
Patient Name: Adrian Singh  MRN: [id number]  Epilepsy Attending: Lora Havens  Referring Physician/Provider: Dr Lesleigh Noe Date: 05/01/2020 Duration: 23.22 mins  Patient history: 86yo M s/p fall and left sided weakness. EEG to evaluate for seizure  Level of alertness: Awake, assleep  AEDs during EEG study: None  Technical aspects: This EEG study was done with scalp electrodes positioned according to the 10-20 International system of electrode placement. Electrical activity was acquired at a sampling rate of 500Hz  and reviewed with a high frequency filter of 70Hz  and a low frequency filter of 1Hz . EEG data were recorded continuously and digitally stored.   Description: The posterior dominant rhythm consists of 7.5 Hz activity of moderate voltage (25-35 uV) seen predominantly in posterior head regions, symmetric and reactive to eye opening and eye closing.  Sleep was characterized by vertex waves, sleep spindles (12 to 14 Hz), maximal frontocentral region. EEG showed /intermittent generalized 3 to 6 Hz theta-delta slowing. Hyperventilation and photic stimulation were not performed.     ABNORMALITY -Intermittent slow, generalized  IMPESSION: This study is suggestive of mild diffuse encephalopathy, nonspecific etiology. No seizures or epileptiform discharges were seen throughout the recording.  Deitrick Ferreri Barbra Sarks

## 2020-05-02 ENCOUNTER — Ambulatory Visit: Payer: Medicare Other | Admitting: Internal Medicine

## 2020-05-02 ENCOUNTER — Telehealth: Payer: Self-pay

## 2020-05-02 ENCOUNTER — Observation Stay (HOSPITAL_COMMUNITY): Payer: Medicare Other

## 2020-05-02 DIAGNOSIS — I700 Atherosclerosis of aorta: Secondary | ICD-10-CM | POA: Diagnosis present

## 2020-05-02 DIAGNOSIS — R0603 Acute respiratory distress: Secondary | ICD-10-CM | POA: Diagnosis not present

## 2020-05-02 DIAGNOSIS — S0993XA Unspecified injury of face, initial encounter: Secondary | ICD-10-CM | POA: Diagnosis not present

## 2020-05-02 DIAGNOSIS — G9389 Other specified disorders of brain: Secondary | ICD-10-CM

## 2020-05-02 DIAGNOSIS — Z825 Family history of asthma and other chronic lower respiratory diseases: Secondary | ICD-10-CM | POA: Diagnosis not present

## 2020-05-02 DIAGNOSIS — I480 Paroxysmal atrial fibrillation: Secondary | ICD-10-CM | POA: Diagnosis present

## 2020-05-02 DIAGNOSIS — K219 Gastro-esophageal reflux disease without esophagitis: Secondary | ICD-10-CM | POA: Diagnosis present

## 2020-05-02 DIAGNOSIS — I6381 Other cerebral infarction due to occlusion or stenosis of small artery: Secondary | ICD-10-CM | POA: Diagnosis present

## 2020-05-02 DIAGNOSIS — I130 Hypertensive heart and chronic kidney disease with heart failure and stage 1 through stage 4 chronic kidney disease, or unspecified chronic kidney disease: Secondary | ICD-10-CM | POA: Diagnosis present

## 2020-05-02 DIAGNOSIS — J3489 Other specified disorders of nose and nasal sinuses: Secondary | ICD-10-CM | POA: Diagnosis not present

## 2020-05-02 DIAGNOSIS — S40022A Contusion of left upper arm, initial encounter: Secondary | ICD-10-CM | POA: Diagnosis present

## 2020-05-02 DIAGNOSIS — Z85828 Personal history of other malignant neoplasm of skin: Secondary | ICD-10-CM | POA: Diagnosis not present

## 2020-05-02 DIAGNOSIS — R778 Other specified abnormalities of plasma proteins: Secondary | ICD-10-CM | POA: Diagnosis not present

## 2020-05-02 DIAGNOSIS — E1122 Type 2 diabetes mellitus with diabetic chronic kidney disease: Secondary | ICD-10-CM | POA: Diagnosis present

## 2020-05-02 DIAGNOSIS — J9601 Acute respiratory failure with hypoxia: Secondary | ICD-10-CM | POA: Diagnosis not present

## 2020-05-02 DIAGNOSIS — D649 Anemia, unspecified: Secondary | ICD-10-CM | POA: Diagnosis not present

## 2020-05-02 DIAGNOSIS — I25119 Atherosclerotic heart disease of native coronary artery with unspecified angina pectoris: Secondary | ICD-10-CM | POA: Diagnosis present

## 2020-05-02 DIAGNOSIS — Z8249 Family history of ischemic heart disease and other diseases of the circulatory system: Secondary | ICD-10-CM | POA: Diagnosis not present

## 2020-05-02 DIAGNOSIS — N1831 Chronic kidney disease, stage 3a: Secondary | ICD-10-CM | POA: Diagnosis present

## 2020-05-02 DIAGNOSIS — J189 Pneumonia, unspecified organism: Secondary | ICD-10-CM | POA: Diagnosis present

## 2020-05-02 DIAGNOSIS — Z515 Encounter for palliative care: Secondary | ICD-10-CM | POA: Diagnosis not present

## 2020-05-02 DIAGNOSIS — Z7189 Other specified counseling: Secondary | ICD-10-CM | POA: Diagnosis not present

## 2020-05-02 DIAGNOSIS — R627 Adult failure to thrive: Secondary | ICD-10-CM | POA: Diagnosis not present

## 2020-05-02 DIAGNOSIS — I5032 Chronic diastolic (congestive) heart failure: Secondary | ICD-10-CM | POA: Diagnosis present

## 2020-05-02 DIAGNOSIS — N179 Acute kidney failure, unspecified: Secondary | ICD-10-CM

## 2020-05-02 DIAGNOSIS — J342 Deviated nasal septum: Secondary | ICD-10-CM | POA: Diagnosis not present

## 2020-05-02 DIAGNOSIS — W1830XA Fall on same level, unspecified, initial encounter: Secondary | ICD-10-CM | POA: Diagnosis present

## 2020-05-02 DIAGNOSIS — C7951 Secondary malignant neoplasm of bone: Secondary | ICD-10-CM | POA: Diagnosis present

## 2020-05-02 DIAGNOSIS — E43 Unspecified severe protein-calorie malnutrition: Secondary | ICD-10-CM | POA: Diagnosis present

## 2020-05-02 DIAGNOSIS — W19XXXA Unspecified fall, initial encounter: Secondary | ICD-10-CM | POA: Diagnosis not present

## 2020-05-02 DIAGNOSIS — Y92009 Unspecified place in unspecified non-institutional (private) residence as the place of occurrence of the external cause: Secondary | ICD-10-CM | POA: Diagnosis not present

## 2020-05-02 DIAGNOSIS — Z823 Family history of stroke: Secondary | ICD-10-CM | POA: Diagnosis not present

## 2020-05-02 DIAGNOSIS — E1142 Type 2 diabetes mellitus with diabetic polyneuropathy: Secondary | ICD-10-CM | POA: Diagnosis present

## 2020-05-02 DIAGNOSIS — Z8673 Personal history of transient ischemic attack (TIA), and cerebral infarction without residual deficits: Secondary | ICD-10-CM | POA: Diagnosis not present

## 2020-05-02 DIAGNOSIS — I248 Other forms of acute ischemic heart disease: Secondary | ICD-10-CM | POA: Diagnosis present

## 2020-05-02 DIAGNOSIS — Z20822 Contact with and (suspected) exposure to covid-19: Secondary | ICD-10-CM | POA: Diagnosis present

## 2020-05-02 DIAGNOSIS — Z87442 Personal history of urinary calculi: Secondary | ICD-10-CM | POA: Diagnosis not present

## 2020-05-02 DIAGNOSIS — Z955 Presence of coronary angioplasty implant and graft: Secondary | ICD-10-CM | POA: Diagnosis not present

## 2020-05-02 DIAGNOSIS — I639 Cerebral infarction, unspecified: Secondary | ICD-10-CM | POA: Diagnosis present

## 2020-05-02 DIAGNOSIS — R10811 Right upper quadrant abdominal tenderness: Secondary | ICD-10-CM | POA: Diagnosis not present

## 2020-05-02 LAB — CBC
HCT: 26.4 % — ABNORMAL LOW (ref 39.0–52.0)
Hemoglobin: 8.1 g/dL — ABNORMAL LOW (ref 13.0–17.0)
MCH: 23.5 pg — ABNORMAL LOW (ref 26.0–34.0)
MCHC: 30.7 g/dL (ref 30.0–36.0)
MCV: 76.5 fL — ABNORMAL LOW (ref 80.0–100.0)
Platelets: 193 10*3/uL (ref 150–400)
RBC: 3.45 MIL/uL — ABNORMAL LOW (ref 4.22–5.81)
RDW: 17.8 % — ABNORMAL HIGH (ref 11.5–15.5)
WBC: 8.4 10*3/uL (ref 4.0–10.5)
nRBC: 0 % (ref 0.0–0.2)

## 2020-05-02 LAB — GLUCOSE, CAPILLARY
Glucose-Capillary: 97 mg/dL (ref 70–99)
Glucose-Capillary: 105 mg/dL — ABNORMAL HIGH (ref 70–99)
Glucose-Capillary: 110 mg/dL — ABNORMAL HIGH (ref 70–99)
Glucose-Capillary: 130 mg/dL — ABNORMAL HIGH (ref 70–99)

## 2020-05-02 LAB — COMPREHENSIVE METABOLIC PANEL
ALT: 19 U/L (ref 0–44)
AST: 51 U/L — ABNORMAL HIGH (ref 15–41)
Albumin: 2 g/dL — ABNORMAL LOW (ref 3.5–5.0)
Alkaline Phosphatase: 185 U/L — ABNORMAL HIGH (ref 38–126)
Anion gap: 9 (ref 5–15)
BUN: 13 mg/dL (ref 8–23)
CO2: 22 mmol/L (ref 22–32)
Calcium: 7.2 mg/dL — ABNORMAL LOW (ref 8.9–10.3)
Chloride: 102 mmol/L (ref 98–111)
Creatinine, Ser: 0.8 mg/dL (ref 0.61–1.24)
GFR calc Af Amer: >60 mL/min (ref >60)
GFR calc non Af Amer: >60 mL/min (ref >60)
Glucose, Bld: 96 mg/dL (ref 70–99)
Potassium: 3.3 mmol/L — ABNORMAL LOW (ref 3.5–5.1)
Sodium: 133 mmol/L — ABNORMAL LOW (ref 135–145)
Total Bilirubin: 0.7 mg/dL (ref 0.3–1.2)
Total Protein: 6.4 g/dL — ABNORMAL LOW (ref 6.5–8.1)

## 2020-05-02 LAB — HEPARIN LEVEL (UNFRACTIONATED): Heparin Unfractionated: 0.32 IU/mL (ref 0.30–0.70)

## 2020-05-02 LAB — APTT: aPTT: 88 seconds — ABNORMAL HIGH (ref 24–36)

## 2020-05-02 MED ORDER — MORPHINE SULFATE (PF) 2 MG/ML IV SOLN
1.0000 mg | INTRAVENOUS | Status: DC | PRN
  Administered 2020-05-02 – 2020-05-03 (×5): 1 mg via INTRAVENOUS
  Filled 2020-05-02 (×5): qty 1

## 2020-05-02 MED ORDER — HEPARIN (PORCINE) 25000 UT/250ML-% IV SOLN
1150.0000 [IU]/h | INTRAVENOUS | Status: DC
  Administered 2020-05-02: 1100 [IU]/h via INTRAVENOUS
  Administered 2020-05-04: 1150 [IU]/h via INTRAVENOUS
  Filled 2020-05-02 (×3): qty 250

## 2020-05-02 MED ORDER — MAGIC MOUTHWASH W/LIDOCAINE
10.0000 mL | Freq: Four times a day (QID) | ORAL | Status: DC | PRN
  Administered 2020-05-02 – 2020-05-08 (×12): 10 mL via ORAL
  Filled 2020-05-02 (×16): qty 10

## 2020-05-02 MED ORDER — RESOURCE THICKENUP CLEAR PO POWD
ORAL | Status: DC | PRN
  Filled 2020-05-02 (×2): qty 125

## 2020-05-02 MED ORDER — POTASSIUM CHLORIDE CRYS ER 20 MEQ PO TBCR
40.0000 meq | EXTENDED_RELEASE_TABLET | ORAL | Status: AC
  Administered 2020-05-02 (×2): 40 meq via ORAL
  Filled 2020-05-02: qty 2

## 2020-05-02 NOTE — Social Work (Signed)
CSW acknowledging SNF consult for SNF placement at discharge. Palliative Care currently being discussed. TOC team will continue to follow for discharge planning needs.   Darra Lis, [place] Work

## 2020-05-02 NOTE — Telephone Encounter (Signed)
[facility] Night - Client Nonclinical Telephone Record AccessNurse Client [facility] Night - Client Client Site [place] Physician Viviana Simpler- MD Contact Type Call Who Is Calling Patient / Member / Family / Caregiver Caller Name Froid Phone Number [phone] Patient Name Adrian Singh Patient DOB [birth year] Call Type Message Only Information Provided Reason for Call Request for General Office Information Initial Comment Caller husband on Friday fell in the bathroom, and lost his muscle strength and is still in the hospital and wants to talk to the dr office about what is the best rehab facility for them to take patient when hes discharged. Additional Comment Pt is admitted [facility] location room [phone] the wife thinks. Disp. Time Disposition Final User 05/01/2020 5:08:45 PM General Information Provided Yes Shirlee Latch Call Closed By: Shirlee Latch Transaction Date/Time: 05/01/2020 5:00:42 PM (ET)

## 2020-05-02 NOTE — Progress Notes (Signed)
ANTICOAGULATION CONSULT NOTE - Initial Consult  Pharmacy Consult for heparin Indication: atrial fibrillation  Allergies  Allergen Reactions  . Soma [Carisoprodol] Other (See Comments)    "did a number on me"  . Doxazosin Other (See Comments)    dizziness    Patient Measurements: Height: 5\' 8"  (172.7 cm) Weight: 72.6 kg (160 lb) IBW/kg (Calculated) : 68.4 Heparin Dosing Weight: 72kg  Vital Signs: Temp: 98.7 F (37.1 C) (08/16 0344) Temp Source: Oral (08/16 0344) BP: 135/71 (08/16 0344) Pulse Rate: 78 (08/16 0344)  Labs: Recent Labs    04/30/20 1637 04/30/20 1637 04/30/20 1930 05/01/20 0059 05/02/20 0432  HGB 8.1*   < >  --  7.7* 8.1*  HCT 27.9*  --   --  24.9* 26.4*  PLT 229  --   --  213 193  APTT  --   --  37*  --   --   LABPROT  --   --  16.3*  --   --   INR  --   --  1.4*  --   --   CREATININE 1.32*  --   --  1.00 0.80  CKTOTAL 981*  --   --  717*  --   TROPONINIHS  --   --  19* 19*  --    < > = values in this interval not displayed.    Estimated Creatinine Clearance: 67.7 mL/min (by C-G formula based on SCr of 0.8 mg/dL).   Assessment: 86 yo M on rivaroxaban PTA for afib held for CVA w/u. Per Neuro and TRH, symptoms and imaging are more likely due to brain mets than CVA, ok to start heparin for Afib while waiting NSG consult. Last dose rivaroxaban 8/13 PM. INR 1.6 on admission, may be due to rivaroxaban.   H/H low stable, plt stable. No bleeding. Will check aPTT in case HL is still affected by rivaroxaban.    Goal of Therapy:  Heparin level 0.3-0.7 units/ml Monitor platelets by anticoagulation protocol: Yes   Plan:  Start heparin 1100 units/hr, no bolus F/u 8hr apTT/HL F/u aPTT until correlates with heparin level  Monitor daily aPTT, HL, CBC/plt Monitor for signs/symptoms of bleeding  F/u NSG plans and eventual restart rivaroxaban   Benetta Spar, PharmD, BCPS, BCCP Clinical Pharmacist  Please check AMION for all [place] phone numbers After  10:00 PM, call [place]

## 2020-05-02 NOTE — Progress Notes (Signed)
86 year old male presents after a fall.  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  No evidence of enhancement or significant mass-effect.  Imaging not consistent with neoplastic disease although this cannot be fully ruled out.  The lesion itself is small without any surrounding edema.  I would favor observational management with a follow-up MRI scan in 50months evaluate for growth change.  No indication for any type of biopsy or additional work-up from my standpoint

## 2020-05-02 NOTE — Telephone Encounter (Signed)
Spoke to daughter at patient's bedside at [facility] hospital Prognosis is poor----underlying issues and now with CVA and trouble speaking and swallowing. Discussed palliative care, etc  Will continue to monitor his status

## 2020-05-02 NOTE — Progress Notes (Signed)
Subjective: No significant events overnight.   Exam: Vitals:   05/01/20 1951 05/02/20 0344  BP: 139/74 135/71  Pulse: 77 78  Resp: 18 18  Temp: 98.2 F (36.8 C) 98.7 F (37.1 C)  SpO2: 97% 98%   Gen: In bed, NAD Resp: non-labored breathing, no acute distress Abd: soft, nt  Neuro: MS: Awake, alert oriented CN:VFF, mild left NL fold flattening Motor: Mild 4+/5 left arm weakness(States since his last stroke), also 4/5 left leg weakness.  Sensory:decreased on the left.   Pertinent Labs: Cr 0.8  Impression: 86 yo M with mass-like lesion on MRI. The appearance on CT does not appear consistent with acute stroke to me, and a new demyelinating disease at 8 seems unlikely. The appearance is unusual, but prostate cancer brain mets can have a variable appearance on MRI. I would favor neurosurgical consultation to see if biopsy would be appropriate vs re-imaging.   Recommendations: 1) Neurosurgical consultation.  2) anticoagulation with heparin for now.   Roland Rack, MD [facility] [phone]  If 7pm- 7am, please page neurology on call as listed in [place].

## 2020-05-02 NOTE — Progress Notes (Signed)
ANTICOAGULATION CONSULT NOTE - Follow Up Consult  Pharmacy Consult for Heparin Indication: atrial fibrillation  Allergies  Allergen Reactions  . Soma [Carisoprodol] Other (See Comments)    "did a number on me"  . Doxazosin Other (See Comments)    dizziness    Patient Measurements: Height: 5\' 8"  (172.7 cm) Weight: 72.6 kg (160 lb) IBW/kg (Calculated) : 68.4 Heparin Dosing Weight:  72.6kg  Vital Signs: Temp: 98.8 F (37.1 C) (08/16 1947) Temp Source: Oral (08/16 1947) BP: 146/65 (08/16 1947) Pulse Rate: 84 (08/16 1947)  Labs: Recent Labs    04/30/20 1637 04/30/20 1637 04/30/20 1930 05/01/20 0059 05/02/20 0432 05/02/20 2007  HGB 8.1*   < >  --  7.7* 8.1*  --   HCT 27.9*  --   --  24.9* 26.4*  --   PLT 229  --   --  213 193  --   APTT  --   --  37*  --   --  88*  LABPROT  --   --  16.3*  --   --   --   INR  --   --  1.4*  --   --   --   HEPARINUNFRC  --   --   --   --   --  0.32  CREATININE 1.32*  --   --  1.00 0.80  --   CKTOTAL 981*  --   --  717*  --   --   TROPONINIHS  --   --  19* 19*  --   --    < > = values in this interval not displayed.    Estimated Creatinine Clearance: 67.7 mL/min (by C-G formula based on SCr of 0.8 mg/dL).   Assessment: Anticoag: afib on xarelto pta (LD 8/13)- held. FOBT neg. Hep level 0.32 in goal. APTT also in goal at 88  Goal of Therapy:  Heparin level 0.3-0.7 units/ml Monitor platelets by anticoagulation protocol: Yes   Plan:  Continue heparin 1100 units/hr, Monitor daily HL, CBC/plt   Anneth Brunell S. Alford Highland, PharmD, BCPS Clinical Staff Pharmacist [email or web address] Alford Highland, [place] 05/02/2020,9:11 PM

## 2020-05-02 NOTE — Progress Notes (Signed)
PROGRESS NOTE    Adrian Singh  BZJ:[id number] DOB: April 11, 1934 DOA: 04/30/2020 PCP: [name], MD    Brief Narrative:  86 year old male with past medical history of paroxysmal atrial fibrillation (on Xarelto), coronary disease (s/p PCI with stent to circumflex), diastolic congestive heart failure (last echo 02/2016 EF 45-50%), diabetes mellitus type 2, hypertension, gastroesophageal reflux disease, prostate cancer, hyperlipidemia who presents to [facility] emergency department status post fall.  History is been obtained from both patient and the daughter who is at the bedside.  Patient explains that for the past 2 months he has been experiencing progressively worsening weakness.  Weakness is generalized but has been gradually become more and more severe over the span of time.  Patient also complains of some occasional intermittent left-sided chest discomfort although he states that this is not uncommon considering his history of coronary artery disease.  Patient denies dyspnea on exertion.  Patient does also endorse poor appetite over the same span of time with a 15 pound weight loss.  Patient is also complaining of a productive cough with clear sputum over the span of time but denies fevers, recent travel, sick contacts or confirmed contact with known COVID-19 infection.  Of note, patient has undergone COVID-19 vaccination with shots completed in early June and early July.  Patient's weakness continue to progress resulting in him falling in his backyard several weeks ago.  Yesterday evening, as the patient was attempting to get up off the toilet after using the bathroom he relates did not have the strength to completely rise from a seated position and fell to the floor.  The patient was too weak to get up, even with his wife attempting to help him throughout the evening.  The patient remained lying on the floor for essentially the entire evening until the patient's daughter came  to assist him the following morning.  Patient denies any loss of consciousness or head trauma with this fall.  According to the daughter who is at the bedside, she also noticed that the patient was having difficulty moving his left side shortly after arriving to help him.  She also endorses new onset slurring of his words since she found him this morning.  Upon evaluation in the emergency department, patient is complaining of moderate to severe sore musculoskeletal pain of the left chest wall, bilateral hips and legs.  X-rays of all of these areas is negative for fracture.  500 cc bolus was provided of isotonic fluids.  Due to the patient's progressive weakness and fall the hospitalist group has been called to assess patient for admission to the hospital.   Assessment & Plan:   Principal Problem:   Fall at home, initial encounter Active Problems:   Essential hypertension   Uncontrolled type 2 diabetes mellitus with diabetic polyneuropathy, without long-term current use of insulin (HCC)   PAF (paroxysmal atrial fibrillation) (HCC)   Atherosclerosis of native coronary artery of native heart with angina pectoris ([place])   Primary malignant neoplasm of prostate metastatic to bone [facility])   Generalized weakness   Acute on chronic anemia   Elevated CK   Chronic kidney disease, stage 3a   Elevated troponin level not due myocardial infarction   Stroke [facility])  Principal Problem:   Fall at home, initial encounter, likely secondary to acute CVA  MRI personally reviewed. Findings of 38mm acute ischemic R frontal centrum semi ovale infarct as well as 1.5-2cm lesion involving R corpus callosum, likely evolving subactue infarct, see below  PT/OT/SLP consulted  Pt has hx afib, presented with hgb 7.7 with no evidence of acute blood loss. Home xarelto held at time of presentation, now on heparin gtt  Have consulted Neurology, will f/u on recs  2d echo performed, pending results  Carotid dopplers  ordered and reviewed, findings of 40-59% R ICA stenosis  Seen by neurology. Recommendation for heparin gtt for now. Per Neuro, concerns for possible metastatic foci on imaging instead of CVA. Have discussed with Neurosurgeon on call who reviewed images. Findings radiographically not consistent with neoplastic disease with recommendation for f/u MRI in 2 months. No indication for biopsy or additional workup per Neurosurgeon  PT/OT consulted, f/u recs  Appreciate input by pt's PCP, Dr. Silvio Pate. Overall poor prognosis. Agree with discussion about palliative care  Active Problems:   Generalized weakness  PT/OT consulted per above    Essential hypertension  Holding ramapril to allow more permissive HTN given acute CVA  Continuing metoprolol for rate control given hx afib    Uncontrolled type 2 diabetes mellitus with diabetic polyneuropathy, without long-term current use of insulin (HCC)  Accu-Cheks before every meal and nightly with sliding scale insulin  Holding home regimen of oral hypoglycemics  Glucose trends currently stable    PAF (paroxysmal atrial fibrillation) (HCC)  Continue home regimen of AV nodal blocking therapy  Patient is seemingly currently rate controlled  Monitoring patient on telemetry for now  Currently on heparin gtt per above  Elevated troponin not due to myocardial infarction  Slightly elevated troponin without notable EKG changes and atypical reproducible chest discomfort  Patient is unlikely to be suffering from plaque rupture  Chest discomfort likely secondary to recent fall  Stable currently  Acute on chronic anemia, microcytic  Holding Xarelto  Stool neg for blood  Iron studies pending  Chart reviewed. Pt is s/p colonoscopy 2011 with finding of benign polyp with recommendation for repeat screening colon 2021  Would have pt f/u for outpt work up on d/c    Elevated CK  Slightly elevated creatinine kinase  Hydrate patient  with intravenous isotonic fluids to avoid rhabdomyolysis  Performing serial creatine kinase levels to ensure downtrending and resolution.  Recheck bmet in AM    Chronic kidney disease, stage 3a  Creatinine is essentially at baseline  Strict input and output monitoring  Cont to avoid nephrotoxic agents  recheck bmet in AM    Primary malignant neoplasm of prostate metastatic to bone [facility])  Outpatient follow-up  DVT prophylaxis: SCD's Code Status: Full Family Communication: Pt in room, family at bedside  Status is: Observation  The patient will require care spanning > 2 midnights and should be moved to inpatient because: Ongoing diagnostic testing needed not appropriate for outpatient work up, Unsafe d/c plan and Inpatient level of care appropriate due to severity of illness  Dispo: The patient is from: Home              Anticipated d/c is to: SNF              Anticipated d/c date is: 2 days              Patient currently is not medically stable to d/c.  Consultants:   Neurology  Procedures:     Antimicrobials: Anti-infectives (From admission, onward)   None      Subjective: Complaining of L facial pain  Objective: Vitals:   05/01/20 2135 05/02/20 0008 05/02/20 0344 05/02/20 1201  BP:   135/71 (!) 145/64  Pulse:  78 79  Resp:   18 17  Temp:   98.7 F (37.1 C) 99.1 F (37.3 C)  TempSrc:   Oral Oral  SpO2:   98% 97%  Weight:  72.6 kg    Height: 5\' 8"  (1.727 m)       Intake/Output Summary (Last 24 hours) at 05/02/2020 1404 Last data filed at 05/02/2020 0347 Gross per 24 hour  Intake 500 ml  Output 400 ml  Net 100 ml   Filed Weights   04/30/20 2143 05/01/20 0419 05/02/20 0008  Weight: 71.7 kg 72.1 kg 72.6 kg    Examination: General exam: Awake, laying in bed, appears uncomfortable in pain Respiratory system: Normal respiratory effort, no wheezing Cardiovascular system: regular rate, s1, s2 Gastrointestinal system: Soft, nondistended,  positive BS Central nervous system: CN2-12 grossly intact, strength intact Extremities: Perfused, no clubbing Skin: Normal skin turgor, no notable skin lesions seen Psychiatry: Mood normal // no visual hallucinations   Data Reviewed: I have personally reviewed following labs and imaging studies  CBC: Recent Labs  Lab 04/29/20 1218 04/30/20 1637 05/01/20 0059 05/02/20 0432  WBC 6.6 10.2 8.1 8.4  NEUTROABS  --  7.5 6.0  --   HGB 8.5 Repeated and verified X2.* 8.1* 7.7* 8.1*  HCT 26.7 Repeated and verified X2.* 27.9* 24.9* 26.4*  MCV 75.0* 79.3* 75.9* 76.5*  PLT 224.0 229 213 001   Basic Metabolic Panel: Recent Labs  Lab 04/29/20 1218 04/30/20 1637 05/01/20 0059 05/02/20 0432  NA 136 138 138 133*  K 4.7 3.4* 3.2* 3.3*  CL 101 104 106 102  CO2 25 22 22 22   GLUCOSE 245* 98 126* 96  BUN 20 25* 21 13  CREATININE 1.07 1.32* 1.00 0.80  CALCIUM 8.2* 8.3* 7.7* 7.2*  MG  --   --  2.0  --    GFR: Estimated Creatinine Clearance: 67.7 mL/min (by C-G formula based on SCr of 0.8 mg/dL). Liver Function Tests: Recent Labs  Lab 04/29/20 1218 05/01/20 0059 05/02/20 0432  AST 21 53* 51*  ALT 7 18 19   ALKPHOS 183* 159* 185*  BILITOT 0.5 0.7 0.7  PROT 6.5 6.6 6.4*  ALBUMIN 2.9* 2.0* 2.0*   No results for input(s): LIPASE, AMYLASE in the last 168 hours. No results for input(s): AMMONIA in the last 168 hours. Coagulation Profile: Recent Labs  Lab 04/30/20 1930  INR 1.4*   Cardiac Enzymes: Recent Labs  Lab 04/30/20 1637 05/01/20 0059  CKTOTAL 981* 717*   BNP (last 3 results) No results for input(s): PROBNP in the last 8760 hours. HbA1C: Recent Labs    04/30/20 1637  HGBA1C 7.5*   CBG: Recent Labs  Lab 05/01/20 1155 05/01/20 1633 05/01/20 2112 05/02/20 0611 05/02/20 1203  GLUCAP 223* 136* 81 97 105*   Lipid Profile: No results for input(s): CHOL, HDL, LDLCALC, TRIG, CHOLHDL, LDLDIRECT in the last 72 hours. Thyroid Function Tests: Recent Labs     04/30/20 1930  TSH 1.164   Anemia Panel: Recent Labs    04/30/20 1930 05/01/20 0103  VITAMINB12 1,350*  --   FOLATE 37.5  --   TIBC  --  238*  IRON  --  13*   Sepsis Labs: Recent Labs  Lab 04/30/20 1930  LATICACIDVEN 1.2    Recent Results (from the past 240 hour(s))  SARS Coronavirus 2 by RT PCR (hospital order, performed in [facility] hospital lab) Nasopharyngeal Nasopharyngeal Swab     Status: None   Collection Time: 04/30/20  7:30  PM   Specimen: Nasopharyngeal Swab  Result Value Ref Range Status   SARS Coronavirus 2 NEGATIVE NEGATIVE Final    Comment: (NOTE) SARS-CoV-2 target nucleic acids are NOT DETECTED.  The SARS-CoV-2 RNA is generally detectable in upper and lower respiratory specimens during the acute phase of infection. The lowest concentration of SARS-CoV-2 viral copies this assay can detect is 250 copies / mL. A negative result does not preclude SARS-CoV-2 infection and should not be used as the sole basis for treatment or other patient management decisions.  A negative result may occur with improper specimen collection / handling, submission of specimen other than nasopharyngeal swab, presence of viral mutation(s) within the areas targeted by this assay, and inadequate number of viral copies (<250 copies / mL). A negative result must be combined with clinical observations, patient history, and epidemiological information.  Fact Sheet for Patients:   StrictlyIdeas.no  Fact Sheet for Healthcare Providers: BankingDealers.co.za  This test is not yet approved or  cleared by the [place] FDA and has been authorized for detection and/or diagnosis of SARS-CoV-2 by FDA under an Emergency Use Authorization (EUA).  This EUA will remain in effect (meaning this test can be used) for the duration of the COVID-19 declaration under Section 564(b)(1) of the Act, 21 U.S.C. section 360bbb-3(b)(1), unless the  authorization is terminated or revoked sooner.  Performed at [facility], [place] [address], [place], [place] [address]   Urine Culture     Status: Abnormal   Collection Time: 04/30/20  7:30 PM   Specimen: Urine, Random  Result Value Ref Range Status   Specimen Description URINE, RANDOM  Final   Special Requests NONE  Final   Culture (A)  Final    <10,000 COLONIES/mL INSIGNIFICANT GROWTH Performed at [facility], [place] [address] [place],  [address]    Report Status 05/01/2020 FINAL  Final     Radiology Studies: EEG  Result Date: 05/01/2020 Lora Havens, MD     05/01/2020  3:33 PM Patient Name: Adrian Singh MRN: [id number] Epilepsy Attending: Lora Havens Referring Physician/Provider: Dr Lesleigh Noe Date: 05/01/2020 Duration: 23.22 mins Patient history: 86yo M s/p fall and left sided weakness. EEG to evaluate for seizure Level of alertness: Awake, assleep AEDs during EEG study: None Technical aspects: This EEG study was done with scalp electrodes positioned according to the 10-20 International system of electrode placement. Electrical activity was acquired at a sampling rate of 500Hz  and reviewed with a high frequency filter of 70Hz  and a low frequency filter of 1Hz . EEG data were recorded continuously and digitally stored. Description: The posterior dominant rhythm consists of 7.5 Hz activity of moderate voltage (25-35 uV) seen predominantly in posterior head regions, symmetric and reactive to eye opening and eye closing.  Sleep was characterized by vertex waves, sleep spindles (12 to 14 Hz), maximal frontocentral region. EEG showed /intermittent generalized 3 to 6 Hz theta-delta slowing. Hyperventilation and photic stimulation were not performed.   ABNORMALITY -Intermittent slow, generalized IMPESSION: This study is suggestive of mild diffuse encephalopathy, nonspecific etiology. No seizures or epileptiform discharges were seen throughout the recording. Lora Havens   DG Chest 1 View  Result Date: 04/30/2020 CLINICAL DATA:  Pneumonia, history of AFib EXAM: CHEST  1 VIEW COMPARISON:  None. FINDINGS: The heart size and mediastinal contours are within normal limits. Both lungs are clear. The visualized skeletal structures are unremarkable. There is a small hiatal hernia. IMPRESSION: No active disease. Electronically  Signed   By: Prudencio Pair M.D.   On: 04/30/2020 21:08   DG Ribs Unilateral W/Chest Right  Result Date: 04/30/2020 CLINICAL DATA:  Fall EXAM: RIGHT RIBS AND CHEST - 3+ VIEW COMPARISON:  Chest radiograph dated 02/28/2016 FINDINGS: No fracture or other bone lesions are seen involving the ribs. There is no evidence of pneumothorax or pleural effusion. Both lungs are clear. Heart size and mediastinal contours are within normal limits. IMPRESSION: Negative. Electronically Signed   By: Zerita Boers M.D.   On: 04/30/2020 18:14   DG Lumbar Spine Complete  Result Date: 04/30/2020 CLINICAL DATA:  Fall with back pain EXAM: LUMBAR SPINE - COMPLETE 4+ VIEW COMPARISON:  None. FINDINGS: There is no evidence of lumbar spine fracture. There is lumbar dextrocurvature. Moderate to severe degenerative changes are seen in the lumbar spine. IMPRESSION: Negative for acute fracture. Electronically Signed   By: Zerita Boers M.D.   On: 04/30/2020 18:16   CT Head Wo Contrast  Result Date: 04/30/2020 CLINICAL DATA:  Status post fall. EXAM: CT HEAD WITHOUT CONTRAST TECHNIQUE: Contiguous axial images were obtained from the base of the skull through the vertex without intravenous contrast. COMPARISON:  March 31, 2014 FINDINGS: Brain: There is mild cerebral atrophy with widening of the extra-axial spaces and ventricular dilatation. There are areas of decreased attenuation within the white matter tracts of the supratentorial brain, consistent with microvascular disease changes. Vascular: No hyperdense vessel or unexpected calcification. Skull: Normal. Negative for fracture or  focal lesion. Sinuses/Orbits: No acute finding. Other: None. IMPRESSION: 1. Generalized cerebral atrophy. 2. No acute intracranial abnormality. Electronically Signed   By: Virgina Norfolk M.D.   On: 04/30/2020 20:54   MR ANGIO HEAD WO CONTRAST  Result Date: 05/01/2020 CLINICAL DATA:  Brain mass or lesion. EXAM: MRA HEAD WITHOUT CONTRAST TECHNIQUE: Angiographic images of the Circle of Willis were obtained using MRA technique without intravenous contrast. COMPARISON:  Concurrent MRI head with contrast. FINDINGS: Anterior circulation: Patent, normal caliber appearance of the bilateral ICAs. Right A1 segment hypoplasia with moderate to severe short segment narrowing (1041:9). The bilateral MCAs are unremarkable. No proximal occlusion, dissection, vascular malformation or aneurysm. Posterior circulation: Codominant. Normal appearance of the V4 segments. Dominant right AICA. There is no significant stenosis, proximal occlusion, aneurysm, dissection or vascular malformation. Venous sinuses: No evidence of thrombosis. Anatomic variants: Bilateral PCOM hypoplasia. IMPRESSION: High-grade short-segment narrowing involving the right A1 segment. No evidence of aneurysm, dissection or proximal occlusion. Electronically Signed   By: Primitivo Gauze M.D.   On: 05/01/2020 16:58   MR BRAIN WO CONTRAST  Result Date: 05/01/2020 CLINICAL DATA:  Initial evaluation for fall, left-sided weakness and slurred speech. EXAM: MRI HEAD WITHOUT CONTRAST TECHNIQUE: Multiplanar, multiecho pulse sequences of the brain and surrounding structures were obtained without intravenous contrast. COMPARISON:  Prior head CT from 04/30/2020. FINDINGS: Brain: Generalized age appropriate cerebral volume loss. No significant cerebral white matter disease for age. Few small foci of encephalomalacia and gliosis involving the right frontal parietal cortices consistent with chronic right MCA territory infarcts (series 11, images 22, 20). 5 mm focus of  restricted diffusion involving the right frontal centrum semi ovale consistent with a small acute ischemic infarct (series 5, image 85). No associated hemorrhage or mass effect. Additionally, there is a round well-circumscribed ovoid T2/FLAIR hyperintense lesion measuring 1.8 x 1.6 x 2.0 cm (series 10, image 19). Inferior extension to involve the right aspect of the body of the corpus callosum. Mild localized mass effect on the subjacent  right lateral ventricle (series 9, image 10). Associated diffusion signal abnormality without frank restriction or ADC correlate. No associated susceptibility artifact to suggest hemorrhage. Findings indeterminate, but favored to reflect an evolving subacute ischemic infarct. Sequelae of demyelination or possibly tumor could also be considered. No significant surrounding edema or FLAIR signal intensity. No other evidence for acute or subacute ischemia. Gray-white matter differentiation otherwise maintained. No foci of susceptibility artifact to suggest acute or chronic intracranial hemorrhage. No other mass lesion, midline shift or mass effect. No hydrocephalus or extra-axial fluid collection. Pituitary gland suprasellar region within normal limits. Midline structures intact. Vascular: Major intracranial vascular flow voids maintained. Skull and upper cervical spine: Craniocervical junction within normal limits. Degenerative spondylosis at C3-4 with resultant mild-to-moderate spinal stenosis. Subcentimeter T1 hypointense lesion within the C3 vertebral body likely degenerative. Bone marrow signal intensity within normal limits. No scalp soft tissue abnormality. Sinuses/Orbits: Globes and orbital soft tissues within normal limits. Mild scattered mucosal thickening noted within the ethmoidal air cells. Paranasal sinuses are otherwise clear. Trace right mastoid effusion noted, of doubtful significance. Inner ear structures grossly normal. Other: None. IMPRESSION: 1. 5 mm acute ischemic  nonhemorrhagic infarct involving the right frontal centrum semi ovale. 2. 1.8 x 1.6 x 2.0 cm ovoid T2/FLAIR hyperintense lesion involving the posterior right frontal white matter and right corpus callosum, indeterminate, but favored to reflect an evolving subacute ischemic infarct. Sequelae of demyelination or possibly tumor could also be considered. Further assessment with postcontrast imaging recommended for complete characterization. Additionally, a short interval follow-up MRI in 2-3 months would likely be helpful for further evaluation as well. 3. Few small chronic right MCA territory infarcts involving the right frontoparietal cortices as above. 4. Otherwise normal brain MRI for age. Electronically Signed   By: Jeannine Boga M.D.   On: 05/01/2020 01:37   MR BRAIN W CONTRAST  Result Date: 05/01/2020 CLINICAL DATA:  Brain mass EXAM: MRI HEAD WITH CONTRAST TECHNIQUE: Multiplanar, multiecho pulse sequences of the brain and surrounding structures were obtained with intravenous contrast. CONTRAST:  74mL GADAVIST GADOBUTROL 1 MMOL/ML IV SOLN COMPARISON:  04/30/2020 head CT.  05/01/2020 MRI head. FINDINGS: Brain: Mild cerebral atrophy with ex vacuo dilatation. Sequela of remote right MCA territory infarcts, better demonstrated on prior exam. Previously visualized 5 mm focus of right centrum semiovale restricted diffusion does not demonstrate enhancement (10:40). Prior ovoid T2 hypointense lesion involving the right centrum semiovale and right corpus callosum body measuring 1.7 x 1.5 cm (10:40) demonstrates heterogenous enhancement. There are no additional foci of abnormal enhancement. No midline shift, ventriculomegaly or extra-axial fluid collection. Vascular: Please see MRA. Skull and upper cervical spine: Normal marrow signal intensity. No enhancing lesion. Sinuses/Orbits: Normal orbits. Clear paranasal sinuses. No mastoid effusion. Other: None. IMPRESSION: Heterogenously enhancing 1.7 cm right centrum  semiovale lesion extending into the corpus callosum body may reflect an active demyelinating focus versus subacute infarct. Short-term interval follow-up of 2-3 months is recommended. 5 mm focus of prior restricted diffusion within the anterior right centrum semiovale does not enhance and likely reflects infarct. Electronically Signed   By: Primitivo Gauze M.D.   On: 05/01/2020 16:49   CT CHEST ABDOMEN PELVIS W CONTRAST  Result Date: 05/01/2020 CLINICAL DATA:  Looking for malignancy in setting of brain mass, poss metastatic lesion Patient has history of prostate cancer. EXAM: CT CHEST, ABDOMEN, AND PELVIS WITH CONTRAST TECHNIQUE: Multidetector CT imaging of the chest, abdomen and pelvis was performed following the standard protocol during bolus administration of intravenous contrast. CONTRAST:  133mL OMNIPAQUE IOHEXOL 300 MG/ML  SOLN COMPARISON:  Abdominal CT 09/14/2019, abdominal CT 02/05/2017. No prior cross-sectional imaging of the chest. No dominant pulmonary mass. Scattered subsegmental atelectasis in the left greater than right lower lobe. Compressive atelectasis in the lung bases related to hiatal hernia. No findings of pulmonary edema. No pleural fluid. Trachea and central bronchi are patent. FINDINGS: CT CHEST FINDINGS Cardiovascular: Aortic atherosclerosis. No aortic aneurysm or dissection. Common origin of the brachiocephalic and left common carotid artery. Aortic branch vessels are tortuous. Heart is normal in size. Coronary artery calcifications versus stent. No pericardial effusion. Mediastinum/Nodes: Moderate hiatal hernia. Mild wall thickening at the gastroesophageal junction. The upper esophagus is patulous. No visualized thyroid nodule. No enlarged mediastinal or hilar lymph nodes. No axillary adenopathy. Lungs/Pleura: 5 mm medial right lower lobe pulmonary nodule in the inferior most right lower lobe series 4, image 92, stable from prior exam. Ill-defined perifissural nodule in the right  lower lobe measuring 6 mm, series 4, image 69. Musculoskeletal: Multifocal sclerotic lesions throughout the skeleton. This includes the right proximal humerus, right scapula, both clavicles, multiple ribs and throughout the thoracic spine. Two 6 and T7 vertebral bodies are diffusely sclerotic. Diffuse degenerative change throughout the thoracic spine. There is no evidence of pathologic fracture. CT ABDOMEN PELVIS FINDINGS Hepatobiliary: No focal hepatic lesion. Gallbladder is unremarkable. No biliary dilatation. Pancreas: Parenchymal atrophy. Low-density lesion involving the pancreatic head measures 2 cm, grossly stable from immediate prior exam, however motion artifact obscures detailed assessment. Few pancreatic head calcifications. No peripancreatic fat stranding. No ductal dilatation. Spleen: Low-density lesion in the splenic dome, series 3, image 38, partially obscured, but grossly unchanged from prior exam. Normal in size. Adrenals/Urinary Tract: No adrenal nodule. No hydronephrosis or perinephric edema. Homogeneous renal enhancement with symmetric excretion on delayed phase imaging. There is bilateral renal cortical scarring and small low-density renal cortical lesions in both kidneys, too small to characterize but likely cysts. No evidence of suspicious renal lesion. Urinary bladder is physiologically distended without wall thickening. Stomach/Bowel: Moderate hiatal hernia. No gastric wall thickening or evidence of mass. Normal positioning of the duodenum and ligament of Treitz. No small bowel obstruction, administered enteric contrast reaches the colon. No bowel wall thickening. The appendix is not well visualized on the current exam. Mild gaseous colonic distension without colonic wall thickening. Sigmoid colon is tortuous and courses into the upper abdomen. Scattered small volume of colonic stool. There is no colonic wall thickening or evidence of colonic mass. Vascular/Lymphatic: Aortic atherosclerosis.  No aortic aneurysm. Portal vein is patent. Splenic artery is tortuous. No acute vascular findings. No enlarged lymph nodes in the abdomen or pelvis. Reproductive: Prostate gland normal in size. The previous enhancing focus in the right aspect of the gland is not well visualized currently. There is mild mass effect of the left aspect of the prostate on the bladder base. Other: No ascites.  No omental thickening or omental disease. Musculoskeletal: Multifocal sclerotic osseous metastatic disease throughout the pelvis and spine. The degree of bony sclerosis involving the right iliac bone is progressed from prior, for example series 3, image 88. There is also progression involving the left iliac bone, series 3, image 87. There is no evidence of pathologic fracture. IMPRESSION: 1. Multifocal sclerotic osseous metastatic disease throughout the chest, abdomen, and pelvis, patient with known metastatic prostate cancer. Progressive bony sclerosis involving the right and left iliac bones from December suggests progression of osseous metastatic disease. 2. Stable 5 mm right lower lobe pulmonary nodule. There  is a 6 mm perifissural nodule in the right lower lobe that was not previously included in the field of view, nonspecific. This may represent an intrapulmonary lymph node, however recommend attention on follow-up. 3. Low-density lesion in the pancreatic head is grossly stable from immediate prior exam, however motion artifact obscures detailed assessment currently. 4. Moderate hiatal hernia. Aortic Atherosclerosis (ICD10-I70.0). Electronically Signed   By: Keith Rake M.D.   On: 05/01/2020 20:05   DG Shoulder Left  Result Date: 04/30/2020 CLINICAL DATA:  Fall with left shoulder pain EXAM: LEFT SHOULDER - 2+ VIEW COMPARISON:  Left shoulder radiographs dated 02/18/2004. FINDINGS: There is no evidence of fracture or dislocation. Degenerative changes are seen in the acromioclavicular joint. Soft tissues are  unremarkable. IMPRESSION: Negative. Electronically Signed   By: Zerita Boers M.D.   On: 04/30/2020 18:18   ECHOCARDIOGRAM COMPLETE BUBBLE STUDY  Result Date: 05/01/2020    ECHOCARDIOGRAM REPORT   Patient Name:   Adrian Singh Date of Exam: 05/01/2020 Medical Rec #:  [id number]      Height:       68.0 in Accession #:    [id number]     Weight:       159.0 lb Date of Birth:  06-Jul-1934     BSA:          1.854 m Patient Age:    80 years       BP:           112/89 mmHg Patient Gender: M              HR:           80 bpm. Exam Location:  Inpatient Procedure: 2D Echo and Saline Contrast Bubble Study Indications:    Stroke 434.91 / I63.9  History:        Patient has prior history of Echocardiogram examinations, most                 recent 02/29/2016. Stroke; Risk Factors:Hypertension, Diabetes                 and Dyslipidemia. Past history of Afib, cancer, coronary                 disease.  Sonographer:    Darlina Sicilian RDCS Referring Phys: [id number] [place]  1. Left ventricular ejection fraction, by estimation, is 50 to 55%. The left ventricle has mildly decreased function. The left ventricle demonstrates regional wall motion abnormalities (see scoring diagram/findings for description). Left ventricular diastolic parameters are indeterminate. There is severe hypokinesis of the left ventricular, entire inferolateral wall. There is moderate hypokinesis of the left ventricular, basal-mid inferior wall.  2. Right ventricular systolic function is normal. The right ventricular size is normal.  3. Left atrial size was mildly dilated.  4. The mitral valve is normal in structure. Mild mitral valve regurgitation.  5. The aortic valve is tricuspid. Aortic valve regurgitation is not visualized. No aortic stenosis is present.  6. Agitated saline contrast bubble study was negative, with no evidence of any interatrial shunt. Conclusion(s)/Recommendation(s): No intracardiac source of embolism detected on this  transthoracic study. A transesophageal echocardiogram is recommended to exclude cardiac source of embolism if clinically indicated. FINDINGS  Left Ventricle: Left ventricular ejection fraction, by estimation, is 50 to 55%. The left ventricle has mildly decreased function. The left ventricle demonstrates regional wall motion abnormalities. Severe hypokinesis of the left ventricular, entire inferolateral wall. Moderate hypokinesis of the left ventricular,  basal-mid inferior wall. The left ventricular internal cavity size was normal in size. There is no left ventricular hypertrophy. Left ventricular diastolic parameters are indeterminate. Right Ventricle: The right ventricular size is normal. No increase in right ventricular wall thickness. Right ventricular systolic function is normal. Left Atrium: Left atrial size was mildly dilated. Right Atrium: Right atrial size was normal in size. Pericardium: There is no evidence of pericardial effusion. Mitral Valve: The mitral valve is normal in structure. Mild mitral valve regurgitation. Tricuspid Valve: The tricuspid valve is grossly normal. Tricuspid valve regurgitation is trivial. No evidence of tricuspid stenosis. Aortic Valve: The aortic valve is tricuspid. Aortic valve regurgitation is not visualized. No aortic stenosis is present. Pulmonic Valve: The pulmonic valve was not well visualized. Pulmonic valve regurgitation is not visualized. Aorta: The aortic arch was not well visualized and the ascending aorta was not well visualized. Venous: The inferior vena cava was not well visualized. IAS/Shunts: No atrial level shunt detected by color flow Doppler. Agitated saline contrast was given intravenously to evaluate for intracardiac shunting. Agitated saline contrast bubble study was negative, with no evidence of any interatrial shunt.  LEFT VENTRICLE PLAX 2D LVIDd:         3.30 cm  Diastology LVIDs:         2.20 cm  LV e' lateral:   7.88 cm/s LV PW:         1.00 cm  LV E/e'  lateral: 7.2 LV IVS:        1.00 cm  LV e' medial:    4.35 cm/s LVOT diam:     1.70 cm  LV E/e' medial:  13.0 LV SV:         39 LV SV Index:   21 LVOT Area:     2.27 cm  RIGHT VENTRICLE RV S prime:     14.80 cm/s TAPSE (M-mode): 2.0 cm LEFT ATRIUM             Index       RIGHT ATRIUM          Index LA diam:        2.80 cm 1.51 cm/m  RA Area:     9.13 cm LA Vol (A2C):   50.0 ml 26.97 ml/m RA Volume:   16.50 ml 8.90 ml/m LA Vol (A4C):   48.8 ml 26.32 ml/m LA Biplane Vol: 52.1 ml 28.10 ml/m  AORTIC VALVE LVOT Vmax:   85.50 cm/s LVOT Vmean:  54.600 cm/s LVOT VTI:    0.170 m  AORTA Ao Root diam: 2.70 cm MITRAL VALVE MV Area (PHT): 3.77 cm    SHUNTS MV Decel Time: 201 msec    Systemic VTI:  0.17 m MV E velocity: 56.60 cm/s  Systemic Diam: 1.70 cm MV A velocity: 93.00 cm/s MV E/A ratio:  0.61 Buford Dresser MD Electronically signed by Buford Dresser MD Signature Date/Time: 05/01/2020/3:44:28 PM    Final    DG Hip Unilat W or Wo Pelvis 2-3 Views Right  Result Date: 04/30/2020 CLINICAL DATA:  Fall with hip pain EXAM: DG HIP (WITH OR WITHOUT PELVIS) 2-3V RIGHT COMPARISON:  None. FINDINGS: There is no evidence of hip fracture or dislocation. Degenerative changes are seen in the spine and hips. IMPRESSION: No acute findings. Electronically Signed   By: Zerita Boers M.D.   On: 04/30/2020 18:19   VAS US CAROTID  Result Date: 05/01/2020 Carotid Arterial Duplex Study Indications:       CVA. Risk Factors:  Hypertension, Diabetes, coronary artery disease. Comparison Study:  04/01/2014- 1-39% ICA stenosis bilaterally. Performing Technologist: Maudry Mayhew MHA, RDMS, RVT, RDCS  Examination Guidelines: A complete evaluation includes B-mode imaging, spectral Doppler, color Doppler, and power Doppler as needed of all accessible portions of each vessel. Bilateral testing is considered an integral part of a complete examination. Limited examinations for reoccurring indications may be performed as  noted.  Right Carotid Findings: +----------+--------+--------+--------+-----------------------+--------+           PSV cm/sEDV cm/sStenosisPlaque Description     Comments +----------+--------+--------+--------+-----------------------+--------+ CCA Prox  70      12                                              +----------+--------+--------+--------+-----------------------+--------+ CCA Distal69      13                                              +----------+--------+--------+--------+-----------------------+--------+ ICA Prox  195     46      40-59%  heterogenous and smooth         +----------+--------+--------+--------+-----------------------+--------+ ICA Distal90      22                                              +----------+--------+--------+--------+-----------------------+--------+ ECA       71                                                      +----------+--------+--------+--------+-----------------------+--------+ +----------+--------+-------+----------------+-------------------+           PSV cm/sEDV cmsDescribe        Arm Pressure (mmHG) +----------+--------+-------+----------------+-------------------+ Subclavian105            Multiphasic, WNL                    +----------+--------+-------+----------------+-------------------+ +---------+--------+--+--------+--+---------+ VertebralPSV cm/s56EDV cm/s13Antegrade +---------+--------+--+--------+--+---------+  Left Carotid Findings: +----------+--------+--------+--------+--------------------------+--------+           PSV cm/sEDV cm/sStenosisPlaque Description        Comments +----------+--------+--------+--------+--------------------------+--------+ CCA Prox  55      10                                                 +----------+--------+--------+--------+--------------------------+--------+ CCA Distal71      16              smooth and heterogenous             +----------+--------+--------+--------+--------------------------+--------+ ICA Prox  113     22                                                 +----------+--------+--------+--------+--------------------------+--------+ ICA Distal89  30              heterogenous and irregular         +----------+--------+--------+--------+--------------------------+--------+ ECA       57      7                                                  +----------+--------+--------+--------+--------------------------+--------+ +----------+--------+--------+----------------+-------------------+           PSV cm/sEDV cm/sDescribe        Arm Pressure (mmHG) +----------+--------+--------+----------------+-------------------+ [id number]              Multiphasic, WNL                    +----------+--------+--------+----------------+-------------------+ +---------+--------+--+--------+--+---------+ VertebralPSV cm/s66EDV cm/s20Antegrade +---------+--------+--+--------+--+---------+   Summary: Right Carotid: Velocities in the right ICA are consistent with a 40-59%                stenosis. Left Carotid: Velocities in the left ICA are consistent with a 1-39% stenosis. Vertebrals:  Bilateral vertebral arteries demonstrate antegrade flow. Subclavians: Normal flow hemodynamics were seen in bilateral subclavian              arteries. *See table(s) above for measurements and observations.  Electronically signed by Antony Contras MD on 05/01/2020 at 2:21:54 PM.    Final    CT MAXILLOFACIAL WO CONTRAST  Result Date: 05/02/2020 CLINICAL DATA:  Initial evaluation for acute facial trauma, jaw pain. EXAM: CT MAXILLOFACIAL WITHOUT CONTRAST TECHNIQUE: Multidetector CT imaging of the maxillofacial structures was performed. Multiplanar CT image reconstructions were also generated. COMPARISON:  None available. FINDINGS: Osseous: Zygomatic arches intact. No acute maxillary fracture. Pterygoid plates intact. Nasal bones  intact. Trace left-to-right nasal septal deviation without fracture. Mandible intact without fracture or other abnormality. Mandibular condyles are normally situated. Patient is edentulous. Few sclerotic foci in noted within the left lateral mass of C3 as well as the C3 vertebral body, consistent with history of metastatic prostate cancer. Possible additional sclerotic focus noted at the tip of the dens. Orbits: Globes and orbital soft tissues within normal limits. Bony orbits intact. Sinuses: Mild scattered mucoperiosteal thickening present about the ethmoidal air cells and maxillary sinuses. Paranasal sinuses are otherwise clear. No air-fluid levels to suggest acute sinusitis. Visualized mastoids and middle ear cavities are well pneumatized and free of fluid. Probable cerumen noted within the left EAC. Soft tissues: No visible soft tissue swelling or other abnormality about the face. Salivary glands within normal limits. No adenopathy within the visualized neck. Atheromatous plaque noted about the right greater than left carotid bifurcations. Limited intracranial: Previously identified masslike lesion involving the posterior right frontal white matter/corpus callosum faintly visible, better seen on recent MRIs. Previously identified subcentimeter infarct not well seen. Visualized portions of the brain demonstrate no other new or acute abnormality. IMPRESSION: 1. No acute maxillofacial injury identified. Specifically, no fracture or other abnormality about the mandible. 2. Few sclerotic foci involving the C3 vertebral body and possibly the tip of the dens, consistent with history of metastatic prostate cancer. 3. Previously identified masslike lesion involving the posterior right frontal white matter/corpus callosum faintly visible, better evaluated on recent MRIs. Electronically Signed   By: Jeannine Boga M.D.   On: 05/02/2020 02:36    Scheduled Meds: . atorvastatin  20 mg Oral Daily  . feeding  supplement (  ENSURE ENLIVE)  237 mL Oral BID BM  . insulin aspart  0-15 Units Subcutaneous TID AC & HS  . metoprolol succinate  25 mg Oral QHS  . tamsulosin  0.4 mg Oral QPC supper   Continuous Infusions: . sodium chloride 75 mL/hr at 05/02/20 1159  . heparin 1,100 Units/hr (05/02/20 1225)     LOS: 0 days   Marylu Lund, MD [facility] Pager On Amion  If 7PM-7AM, please contact night-coverage 05/02/2020, 2:04 PM

## 2020-05-02 NOTE — Telephone Encounter (Signed)
[place] Night - Client Nonclinical Telephone Record AccessNurse Client [place] Night - Client Client Site [place] Physician Viviana Simpler- MD Contact Type Call Who Is Calling Patient / Member / Family / Caregiver Caller Name Morene Rankins Caller Phone Number [phone] Patient Name Blayton Huttner Patient DOB 1933-12-25 Call Type Message Only Information Provided Reason for Call Request for General Office Information Initial Comment Caller states she is calling regarding her father. States she wants to be the one that shares any test results with her mother. States mother must have someone with her when giving test results and mother cannot be caught off guard. Disp. Time Disposition Final User 05/01/2020 7:42:34 PM General Information Provided Yes [place], [place] Call Closed By: Shireen Quan Transaction Date/Time: 05/01/2020 7:36:31 PM (ET)

## 2020-05-02 NOTE — Progress Notes (Signed)
Floor coverage  Patient admitted 8/14 after a fall.  Nursing staff notified me that he has been complaining of right-sided jaw pain.  He is barely eating and is having difficulty taking p.o. medications.  No signs of respiratory distress.  Not tachypneic or hypoxic.  Plan -CT maxillofacial ordered to rule out fracture

## 2020-05-02 NOTE — Progress Notes (Signed)
  Speech Language Pathology Treatment: Dysphagia  Patient Details Name: Adrian Singh MRN: [id number] DOB: 1934-05-16 Today's Date: 05/02/2020 Time: 1430-1500 SLP Time Calculation (min) (ACUTE ONLY): 30 min  Assessment / Plan / Recommendation Clinical Impression  Pt seen at bedside for follow up after BSE completed 05/01/20. Pt's daughter present. RN reports pt now requires meds crushed in puree, and has been given pain meds throughout the day.   Daughter provided teaspoon bolus of broth. Pt exhibited an immediate cough response. SLP provided small bolus via straw, encouraging pt to lower chin to a neutral position. Congested cough elicited. Pt appeared to tolerate nectar thick liquid trial without overt s/s aspiration. Recommend changing liquids to nectar thick liquid. Pt's daughter reports he has mainly consumed soup - he should be able to get cream soups on nectar thick liquids. SLP will continue to follow for assessment of diet tolerance, and continued education.  Pt would benefit from SLE - today is not ideal, however, given pain meds given thus far. Request order for cognitive linguistic evaluation.   HPI HPI: Pt is an 86 yo male presenting s/p fall with L sided weakness and slurred speech. CXR 8/15 without active disease. MRI showed acute infarct involving the R frontal centrum semi ovale as well as a hyperintense lesion involving the posterior R frontal white matter and R corpus callosum favored to reflect subacute ischemic infarct. PMH includes: HTN, DM, stroke, prostate ca, skin ca, GER, esophageal dilation, CAD, afib      SLP Plan  Continue with current plan of care       Recommendations  Diet recommendations: Nectar-thick liquid;Regular (regular textures to allow full range of choices - encourage very soft/full liquids) Liquids provided via: Straw;Cup;Teaspoon Medication Administration: Crushed with puree Supervision: Full supervision/cueing for compensatory strategies;Staff  to assist with self feeding Compensations: Minimize environmental distractions;Slow rate;Small sips/bites;Follow solids with liquid Postural Changes and/or Swallow Maneuvers: Seated upright 90 degrees;Upright 30-60 min after meal                Oral Care Recommendations: Oral care BID Follow up Recommendations: Other (comment) (TBA) Plan: Continue with current plan of care       [place]. Quentin Ore, [facility], [place] Speech Language Pathologist Office: [phone]  Shonna Chock 05/02/2020, 3:02 PM

## 2020-05-03 ENCOUNTER — Inpatient Hospital Stay (HOSPITAL_COMMUNITY): Payer: Medicare Other

## 2020-05-03 DIAGNOSIS — R0603 Acute respiratory distress: Secondary | ICD-10-CM

## 2020-05-03 LAB — HEPARIN LEVEL (UNFRACTIONATED): Heparin Unfractionated: 0.33 IU/mL (ref 0.30–0.70)

## 2020-05-03 LAB — BLOOD GAS, ARTERIAL
Acid-base deficit: 3 mmol/L — ABNORMAL HIGH (ref 0.0–2.0)
Bicarbonate: 21.5 mmol/L (ref 20.0–28.0)
Drawn by: 44135
FIO2: 100
O2 Saturation: 99.5 %
Patient temperature: 36.5
pCO2 arterial: 38.1 mmHg (ref 32.0–48.0)
pH, Arterial: 7.367 (ref 7.350–7.450)
pO2, Arterial: 318 mmHg — ABNORMAL HIGH (ref 83.0–108.0)

## 2020-05-03 LAB — COMPREHENSIVE METABOLIC PANEL
ALT: 20 U/L (ref 0–44)
AST: 53 U/L — ABNORMAL HIGH (ref 15–41)
Albumin: 1.9 g/dL — ABNORMAL LOW (ref 3.5–5.0)
Alkaline Phosphatase: 192 U/L — ABNORMAL HIGH (ref 38–126)
Anion gap: 12 (ref 5–15)
BUN: 15 mg/dL (ref 8–23)
CO2: 20 mmol/L — ABNORMAL LOW (ref 22–32)
Calcium: 7.2 mg/dL — ABNORMAL LOW (ref 8.9–10.3)
Chloride: 106 mmol/L (ref 98–111)
Creatinine, Ser: 0.85 mg/dL (ref 0.61–1.24)
GFR calc Af Amer: >60 mL/min (ref >60)
GFR calc non Af Amer: >60 mL/min (ref >60)
Glucose, Bld: 115 mg/dL — ABNORMAL HIGH (ref 70–99)
Potassium: 3.5 mmol/L (ref 3.5–5.1)
Sodium: 138 mmol/L (ref 135–145)
Total Bilirubin: 1 mg/dL (ref 0.3–1.2)
Total Protein: 6.6 g/dL (ref 6.5–8.1)

## 2020-05-03 LAB — CBC
HCT: 27.3 % — ABNORMAL LOW (ref 39.0–52.0)
Hemoglobin: 8.4 g/dL — ABNORMAL LOW (ref 13.0–17.0)
MCH: 23.9 pg — ABNORMAL LOW (ref 26.0–34.0)
MCHC: 30.8 g/dL (ref 30.0–36.0)
MCV: 77.6 fL — ABNORMAL LOW (ref 80.0–100.0)
Platelets: 211 10*3/uL (ref 150–400)
RBC: 3.52 MIL/uL — ABNORMAL LOW (ref 4.22–5.81)
RDW: 17.8 % — ABNORMAL HIGH (ref 11.5–15.5)
WBC: 9.9 10*3/uL (ref 4.0–10.5)
nRBC: 0 % (ref 0.0–0.2)

## 2020-05-03 LAB — GLUCOSE, CAPILLARY
Glucose-Capillary: 99 mg/dL (ref 70–99)
Glucose-Capillary: 84 mg/dL (ref 70–99)
Glucose-Capillary: 110 mg/dL — ABNORMAL HIGH (ref 70–99)
Glucose-Capillary: 230 mg/dL — ABNORMAL HIGH (ref 70–99)

## 2020-05-03 MED ORDER — MORPHINE SULFATE (PF) 2 MG/ML IV SOLN
2.0000 mg | INTRAVENOUS | Status: DC | PRN
  Administered 2020-05-03 – 2020-05-10 (×10): 2 mg via INTRAVENOUS
  Filled 2020-05-03 (×11): qty 1

## 2020-05-03 MED ORDER — NEPRO/CARBSTEADY PO LIQD
237.0000 mL | Freq: Two times a day (BID) | ORAL | Status: DC
  Administered 2020-05-04 – 2020-05-06 (×4): 237 mL via ORAL

## 2020-05-03 MED ORDER — ADULT MULTIVITAMIN W/MINERALS CH
1.0000 | ORAL_TABLET | Freq: Every day | ORAL | Status: DC
  Administered 2020-05-03 – 2020-05-12 (×10): 1 via ORAL
  Filled 2020-05-03 (×10): qty 1

## 2020-05-03 MED ORDER — METOPROLOL TARTRATE 25 MG/10 ML ORAL SUSPENSION
12.5000 mg | Freq: Two times a day (BID) | ORAL | Status: DC
  Administered 2020-05-03 – 2020-05-12 (×19): 12.5 mg via ORAL
  Filled 2020-05-03 (×24): qty 5

## 2020-05-03 MED ORDER — DOXAZOSIN MESYLATE 1 MG PO TABS
1.0000 mg | ORAL_TABLET | Freq: Every day | ORAL | Status: DC
  Administered 2020-05-03 – 2020-05-11 (×9): 1 mg via ORAL
  Filled 2020-05-03 (×10): qty 1

## 2020-05-03 NOTE — Progress Notes (Signed)
  Speech Language Pathology Treatment: Dysphagia  Patient Details Name: Adrian Singh MRN: [id number] DOB: 09/20/1933 Today's Date: 05/03/2020 Time: 1540-1600 SLP Time Calculation (min) (ACUTE ONLY): 20 min  Assessment / Plan / Recommendation Clinical Impression  Pt was seen for dysphagia treatment with his daughter present. He was alert and cooperative throughout the session. Pt reported that he typically has to swallow pills with puree. He presented with a wet vocal quality which was improved with prompted coughing. He tolerated puree, dysphagia 2 solids, and nectar thick liquids via cup without overt s/sx of aspiration. He exhibited throat clearing and coughing with ice chips, thin liquids via tsp, regular texture solids, and with 1/5 boluses of nectar thick liquids via straw. A dysphagia 1 (puree) diet with nectar thick liquids via cup is recommended at this time. A modified barium swallow study is also clinically indicated to further assess physiology.    HPI HPI: Pt is an 86 yo male presenting s/p fall with L sided weakness and slurred speech. CXR 8/15 without active disease. MRI showed acute infarct involving the R frontal centrum semi ovale as well as a hyperintense lesion involving the posterior R frontal white matter and R corpus callosum favored to reflect subacute ischemic infarct. PMH includes: HTN, DM, stroke, prostate ca, skin ca, GER, esophageal dilation, CAD, afib. Rapid Response on 8/17 secondary to O2 desauturation to the 80s. Pt was suctioned and placed on NRB. CXR 8/17: Shallow lung inflation with left basilar atelectasis      SLP Plan  MBS;New goals to be determined pending instrumental study       Recommendations  Diet recommendations: Dysphagia 1 (puree);Nectar-thick liquid Liquids provided via: Straw;Cup;Teaspoon Medication Administration: Crushed with puree Supervision: Full supervision/cueing for compensatory strategies;Staff to assist with self  feeding Compensations: Minimize environmental distractions;Slow rate;Small sips/bites;Follow solids with liquid Postural Changes and/or Swallow Maneuvers: Seated upright 90 degrees;Upright 30-60 min after meal                Oral Care Recommendations: Oral care BID Follow up Recommendations: Other (comment) (TBA) SLP Visit Diagnosis: Dysphagia, oropharyngeal phase (R13.12) Plan: MBS;New goals to be determined pending instrumental study       [name] I. Hardin Negus, [place], [place] Office number [phone] Pager [place] 05/03/2020, 5:18 PM

## 2020-05-03 NOTE — Evaluation (Addendum)
Occupational Therapy Evaluation Patient Details Name: Adrian Singh MRN: [id number] DOB: [birth year] Today's Date: 05/03/2020    History of Present Illness Pt is an 86 yo male presenting s/p fall with L sided weakness and slurred speech. CXR 8/15 without active disease. MRI showed acute infarct involving the R frontal centrum semi ovale as well as a hyperintense lesion involving the posterior R frontal white matter and R corpus callosum favored to reflect subacute ischemic infarct. PMH includes: HTN, DM, stroke, prostate ca, skin ca, GER, esophageal dilation, CAD, afib    Clinical Impression   This 86 y/o male presents with the above. PTA pt living with spouse and receiving some assist for LB ADL. Pt very pleasant and willing to participate in therapy session. Pt currently presenting with overall weakness and deconditioning. Pt tolerating room level mobility using RW with overall minA (+2 safety/equipment). Pt requiring up to maxA for LB and toileting ADL. Pt initially on 1L O2 upon arrival with SpO2 maintaining >/=92% on RA with activity today. Increased time required during session today as pt with frequent need to have BM. Pt's daughter also present and supportive throughout. Pt to benefit from continued acute OT services and currently recommend follow up therapy services in SNF setting after discharge to maximize his overall safety and independence with ADL and mobility.     Follow Up Recommendations  SNF;Supervision/Assistance - 24 hour    Equipment Recommendations  Other (comment);3 in 1 bedside commode (TBA)           Precautions / Restrictions Precautions Precautions: Fall Restrictions Weight Bearing Restrictions: No      Mobility Bed Mobility Overal bed mobility: Needs Assistance Bed Mobility: Supine to Sit     Supine to sit: Min assist;+2 for safety/equipment     General bed mobility comments: minor LE assist and truncal assist up via R elbow, then pt scooted with min  guard.  cues for direction.  Transfers Overall transfer level: Needs assistance Equipment used: Rolling walker (2 wheeled) Transfers: Sit to/from Stand Sit to Stand: Min assist;+2 safety/equipment;From elevated surface         General transfer comment: more forward assist than boost    Balance Overall balance assessment: Needs assistance Sitting-balance support: No upper extremity supported;Feet supported Sitting balance-Leahy Scale: Fair Sitting balance - Comments: sat stable at EOB while completing some self care tasks that did not require much challenge to balance.   Standing balance support: Bilateral upper extremity supported;During functional activity Standing balance-Leahy Scale: Poor Standing balance comment: relies on UE ssupport and external support.                            ADL either performed or assessed with clinical judgement   ADL Overall ADL's : Needs assistance/impaired Eating/Feeding: Set up Eating/Feeding Details (indicate cue type and reason): pt awaiting speech consult prior to eating  Grooming: Sitting;Minimal assistance;Wash/dry face;Brushing hair Grooming Details (indicate cue type and reason): assist to comb hair seated EOB, minguard assist for sitting balance EOB Upper Body Bathing: Minimal assistance;Sitting   Lower Body Bathing: Moderate assistance;Sit to/from stand   Upper Body Dressing : Minimal assistance;Sitting   Lower Body Dressing: Maximal assistance;Sit to/from stand   Toilet Transfer: Minimal assistance;+2 for safety/equipment;Ambulation;RW;Grab bars;Regular Cytogeneticist Details (indicate cue type and reason): initially mobilizing to bathroom for BM, pt then feeling the urge to have another BM when almost nearing recliner so BSC pulled up behind him Toileting- Clothing  Manipulation and Hygiene: Maximal assistance;Sitting/lateral lean;Sit to/from stand;+2 for safety/equipment Toileting - Clothing Manipulation  Details (indicate cue type and reason): assist for pericare after BM - pt with x2 liquid BMs     Functional mobility during ADLs: Minimal assistance;+2 for safety/equipment;Rolling walker (occasional modA to stand)                           Pertinent Vitals/Pain Pain Assessment: Faces Faces Pain Scale: Hurts a little bit Pain Location: knees Pain Descriptors / Indicators: Grimacing;Discomfort Pain Intervention(s): Monitored during session;Repositioned     Hand Dominance Right   Extremity/Trunk Assessment Upper Extremity Assessment Upper Extremity Assessment: Generalized weakness   Lower Extremity Assessment Lower Extremity Assessment: Defer to PT evaluation   Cervical / Trunk Assessment Cervical / Trunk Assessment: Kyphotic   Communication Communication Communication: No difficulties   Cognition Arousal/Alertness: Awake/alert Behavior During Therapy: WFL for tasks assessed/performed Overall Cognitive Status: Within Functional Limits for tasks assessed                                 General Comments: initially appears [facility] but as pt fatigued towards end of session requiring multimodal cues for processing/following instruction    General Comments  sats on RA stayed at mid 90's or better%.  HR stable; daughter present and supportive during session    Exercises Exercises: Other exercises Other Exercises Other Exercises: LE ROM exercise with graded resistance in gross extension.   Shoulder Instructions      Home Living Family/patient expects to be discharged to:: Private residence Living Arrangements: Spouse/significant other Available Help at Discharge: Family;Available PRN/intermittently Type of Home: House Home Access: Stairs to enter [employer] of Steps: 2 Entrance Stairs-Rails: None Home Layout: One level     Bathroom Shower/Tub: Occupational psychologist: Standard     Home Equipment: Environmental consultant - 2 wheels;Cane -  single point;Cane - quad;Bedside commode;Shower seat          Prior Functioning/Environment Level of Independence: Needs assistance  Gait / Transfers Assistance Needed: 2 canes ambulation but stedy decline over last few months ADL's / Homemaking Assistance Needed: cant do lower body dressing            OT Problem List: Decreased strength;Decreased range of motion;Decreased activity tolerance;Impaired balance (sitting and/or standing);Decreased safety awareness;Decreased knowledge of use of DME or AE;Cardiopulmonary status limiting activity      OT Treatment/Interventions: Self-care/ADL training;Therapeutic exercise;Energy conservation;DME and/or AE instruction;Therapeutic activities;Patient/family education;Balance training    OT Goals(Current goals can be found in the care plan section) Acute Rehab OT Goals Patient Stated Goal: to get better OT Goal Formulation: With patient Time For Goal Achievement: 05/17/20 Potential to Achieve Goals: Good  OT Frequency: Min 2X/week   Barriers to D/C:            Co-evaluation PT/OT/SLP Co-Evaluation/Treatment: Yes Reason for Co-Treatment: For patient/therapist safety;To address functional/ADL transfers (pt with rapid response episode earlier in AM) PT goals addressed during session: Mobility/safety with mobility;Strengthening/ROM OT goals addressed during session: ADL's and self-care      AM-PAC OT "6 Clicks" Daily Activity     Outcome Measure Help from another person eating meals?: A Little Help from another person taking care of personal grooming?: A Little Help from another person toileting, which includes using toliet, bedpan, or urinal?: A Lot Help from another person bathing (including washing, rinsing, drying)?: A Lot  Help from another person to put on and taking off regular upper body clothing?: A Little Help from another person to put on and taking off regular lower body clothing?: A Lot 6 Click Score: 15   End of Session  Equipment Utilized During Treatment: Gait belt;Rolling walker;Oxygen Nurse Communication: Mobility status  Activity Tolerance: Patient tolerated treatment well Patient left: in chair;with call bell/phone within reach;with chair alarm set;with family/visitor present  OT Visit Diagnosis: Unsteadiness on feet (R26.81);Muscle weakness (generalized) (M62.81)                Time: 6924-9324 OT Time Calculation (min): 50 min Charges:  OT General Charges $OT Visit: 1 Visit OT Evaluation $OT Eval Moderate Complexity: 1 Mod OT Treatments $Self Care/Home Management : 8-22 mins  Lou Cal, OT Acute Rehabilitation Services Pager [phone] Office [phone]   Raymondo Band 05/03/2020, 4:58 PM

## 2020-05-03 NOTE — Progress Notes (Signed)
Subjective: Had some problems with secretions overnight.  Exam: Vitals:   05/03/20 0734 05/03/20 1218  BP: (!) 148/75 (!) 105/57  Pulse: 84 88  Resp: 18 18  Temp: 98.3 F (36.8 C) 98 F (36.7 C)  SpO2: 99% 96%   Gen: In bed, NAD Resp: non-labored breathing, no acute distress Abd: soft, nt  Neuro: MS: Awake, alert oriented CN:VFF, mild left NL fold flattening Motor: Mild 4+/5 left arm weakness(States since his last stroke), also 4/5 left leg weakness.  Sensory: decreased on the left.   Pertinent Labs: Cr 0.8  Impression: 86 yo M with mass-like lesion on MRI. The appearance on CT does not appear consistent with acute stroke to me, and a new demyelinating disease at 1 seems unlikely. The appearance is unusual, but prostate cancer brain mets can have a variable appearance on MRI.  Neurosurgery favors waiting and reimaging.  If this does represent stroke, I would expect slow gradual improvement, and therefore more aggressive supportive therapy may be indicated unless he has a poor prognosis from his other metastatic disease.  If this represents CNS metastasis, then I would expect gradual worsening as opposed to improvement and therefore the decision about a feeding tube would be more difficult, though it could buy him some time. Also, given the timeline of his decline and appearance on MRI, it should be safe to resume anticoagulation.   If stroke, then this is likely embolic due to afib, though R carotid has some mild stenosis, I do not think I would aggressively pursue this at this time. Echo with some hypokinesis, but no obvious embolic source. He is anticoagulated anyway. No need for TEE.    Recommendations: 1) could resume oral anticoagulant 2) PT, OT, ST 3) He will need close outpatient follow up with repeat imaging in 1 - 2 months.   Roland Rack, MD [facility] [phone]  If 7pm- 7am, please page neurology on call as listed in [place].

## 2020-05-03 NOTE — Progress Notes (Signed)
RN called RT to assess patient. Patient has audible rhonchi and crackles breath sounds. Patient on room air with spo2 83%. RT suctioned patient orally. Patient coughs with good effort. RT able to suction copious amounts of secretions. RT placed patient on 100% NRB. Spo2 increased to 100%. RT discussed with nurse to obtain CXR and consider lasix. RT will obtain ABG at this time.

## 2020-05-03 NOTE — Progress Notes (Signed)
SLP Cancellation Note  Patient Details Name: Adrian Singh MRN: [id number] DOB: [birth year]   Cancelled treatment:       Reason Eval/Treat Not Completed: Patient's level of consciousness;Medical issues which prohibited therapy Adrian Limerick, RN indicated that the pt was given morphine and is now inadequately alert to participate. She requested that SLP follow up this afternoon. SLP will follow up then as schedule allows.)  Ollie Esty I. Hardin Negus, [place], [place] Office number [phone] Pager [place] 05/03/2020, 11:04 AM

## 2020-05-03 NOTE — Progress Notes (Signed)
Initial Nutrition Assessment  DOCUMENTATION CODES:   Not applicable  INTERVENTION:   -Nepro Shake po BID, each supplement provides 425 kcal and 19 grams protein -Magic cup TID with meals, each supplement provides 290 kcal and 9 grams of protein -MVI with minerals daily  NUTRITION DIAGNOSIS:   Inadequate oral intake related to dysphagia, poor appetite as evidenced by meal completion < 50%.  GOAL:   Patient will meet greater than or equal to 90% of their needs  MONITOR:   PO intake, Supplement acceptance, Diet advancement, Labs, Weight trends, Skin, I & O's  REASON FOR ASSESSMENT:   Malnutrition Screening Tool    ASSESSMENT:   86 year old male with past medical history of paroxysmal atrial fibrillation (on Xarelto), coronary disease (s/p PCI with stent to circumflex), diastolic congestive heart failure (last echo 02/2016 EF 45-50%), diabetes mellitus type 2, hypertension, gastroesophageal reflux disease, prostate cancer, hyperlipidemia who presents to [facility] emergency department status post fall.  Pt admitted with generalized weakness s/p fall.   8/15- s/p BSE- recommend regular diet with thin liquids 8/16- s/p BSE- downgraded to nectar thick liquids  Reviewed I/O's: +1.5 L x 24 hours and +2.7 L x 24 hours  UOP: 800 ml x 24 hours  Pt in with MD at time of visit. Unable to speak with pt or perform nutrition-focused physical exam at this time.   Pt with fair meal completion (50%). Pt was lethargic when SLP attempted to evaluate pt, however, was given morphine prior to visit and unable to fully participate.   Reviewed wt hx; pt has experienced a 12.9% wt loss over the past 6 months, which is significant for time frame. Highly suspect pt with malnutrition, however, unable to identify at this time.   Palliative care consult has been placed for goals of care discussions. Pt with metastatic prostate cancer with new aspiration and FTT along with acute stroke vs  metastatic brain lesions.   Lab Results  Component Value Date   HGBA1C 7.5 (H) 04/30/2020   PTA DM medications are 4 mg amaryl BID and 500 mg metformin BID. Per ADA's Standards of Medical Care for Diabetes, glycemic targets for elderly patients who are otherwise healthy (few medical impairments) and cognitively intact should be less stringent (Hgb A1c <7.5).    Labs reviewed: CBGS: 99 (inpatient orders for glycemic control are 0-15 units in[place]lin aspart TID with meals and at bedtime).   Diet Order:   Diet Order            Diet heart healthy/carb modified Room service appropriate? Yes with Assist; Fluid consistency: Nectar Thick  Diet effective now                 EDUCATION NEEDS:   No education needs have been identified at this time  Skin:  Skin Assessment: Skin Integrity Issues: Skin Integrity Issues:: Stage I, Incisions Stage I: coccyx Incisions: closed mid back  Last BM:  05/01/20  Height:   Ht Readings from Last 1 Encounters:  05/01/20 5\' 8"  (1.727 m)    Weight:   Wt Readings from Last 1 Encounters:  05/03/20 73.5 kg    Ideal Body Weight:  70 kg  BMI:  Body mass index is 24.63 kg/m.  Estimated Nutritional Needs:   Kcal:  1850-2050  Protein:  95-110 grams  Fluid:  > 1.8 L    Loistine Chance, RD, LDN, [place] Registered Dietitian II Certified Diabetes Care and Education Specialist Please refer to [facility] for RD and/or RD  on-call/weekend/after hours pager

## 2020-05-03 NOTE — Progress Notes (Signed)
RN notified night time MD via Amion, concerning pt having oxygen sat low 80's on room air.  RN consulted with Resp tech.  Pt was placed on non-rebreather on highflow.  RN called Rapid response to evaluate patient.  RN awaiting rapid response and doctor.

## 2020-05-03 NOTE — Progress Notes (Signed)
Physical Therapy Treatment Patient Details Name: Adrian Singh MRN: [id number] DOB: 07/08/1934 Today's Date: 05/03/2020    History of Present Illness Pt is an 86 yo male presenting s/p fall with L sided weakness and slurred speech. CXR 8/15 without active disease. MRI showed acute infarct involving the R frontal centrum semi ovale as well as a hyperintense lesion involving the posterior R frontal white matter and R corpus callosum favored to reflect subacute ischemic infarct. PMH includes: HTN, DM, stroke, prostate ca, skin ca, GER, esophageal dilation, CAD, afib     PT Comments    Initially pt was lethargic and congested sounding.  He was able to cough a large sum of phlegm up and was able to perk up a bit.  Emphasis on transitions, sitting balance while doing a few self-care tasks, sit to stand, short distance gait to/from the bathroom, standing for peri-care x2 and ending up in the chair for OOB sitting tolerance.    Follow Up Recommendations  SNF;Supervision/Assistance - 24 hour     Equipment Recommendations  Rolling walker with 5" wheels    Recommendations for Other Services       Precautions / Restrictions Precautions Precautions: Fall Restrictions Weight Bearing Restrictions: No    Mobility  Bed Mobility Overal bed mobility: Needs Assistance Bed Mobility: Supine to Sit     Supine to sit: Min assist;+2 for safety/equipment     General bed mobility comments: minor LE assist and truncal assist up via R elbow, then pt scooted with min guard.  cues for direction.  Transfers Overall transfer level: Needs assistance Equipment used: Rolling walker (2 wheeled) Transfers: Sit to/from Stand Sit to Stand: Min assist;+2 safety/equipment;From elevated surface         General transfer comment: more forward assist than boost  Ambulation/Gait Ambulation/Gait assistance: Min assist;Min guard;+2 safety/equipment Gait Distance (Feet): 15 Feet (to bathroom then 22 feet to  other side of room, onto [facility]) Assistive device: Rolling walker (2 wheeled) Gait Pattern/deviations: Step-through pattern Gait velocity: slow Gait velocity interpretation: <1.31 ft/sec, indicative of household ambulator General Gait Details: short, mildly unsteady steps, but safe with the RW.  Stooped posture, minimal assist during turns and backing up.   Stairs             Wheelchair Mobility    Modified Rankin (Stroke Patients Only)       Balance Overall balance assessment: Needs assistance Sitting-balance support: No upper extremity supported;Feet supported Sitting balance-Leahy Scale: Fair Sitting balance - Comments: sat stable at EOB while completing some self care tasks that did not require much challenge to balance.   Standing balance support: Bilateral upper extremity supported;During functional activity Standing balance-Leahy Scale: Poor Standing balance comment: relies on UE ssupport and external support.                             Cognition Arousal/Alertness: Awake/alert Behavior During Therapy: WFL for tasks assessed/performed Overall Cognitive Status: Within Functional Limits for tasks assessed                                 General Comments: NT formally      Exercises Other Exercises Other Exercises: LE ROM exercise with graded resistance in gross extension.    General Comments General comments (skin integrity, edema, etc.): sats on RA stayed at mid 90's or better%.  HR stable  Pertinent Vitals/Pain Pain Assessment: Faces Faces Pain Scale: Hurts a little bit Pain Location: knees Pain Descriptors / Indicators: Grimacing;Discomfort Pain Intervention(s): Monitored during session    Home Living                      Prior Function            PT Goals (current goals can now be found in the care plan section) Acute Rehab PT Goals Patient Stated Goal: to get better PT Goal Formulation: With patient Time  For Goal Achievement: 05/15/20 Potential to Achieve Goals: Good Progress towards PT goals: Progressing toward goals    Frequency    Min 2X/week      PT Plan Current plan remains appropriate    Co-evaluation PT/OT/SLP Co-Evaluation/Treatment: Yes Reason for Co-Treatment: To address functional/ADL transfers PT goals addressed during session: Mobility/safety with mobility;Strengthening/ROM        AM-PAC PT "6 Clicks" Mobility   Outcome Measure  Help needed turning from your back to your side while in a flat bed without using bedrails?: A Little Help needed moving from lying on your back to sitting on the side of a flat bed without using bedrails?: A Little Help needed moving to and from a bed to a chair (including a wheelchair)?: A Little Help needed standing up from a chair using your arms (e.g., wheelchair or bedside chair)?: A Little Help needed to walk in hospital room?: A Little Help needed climbing 3-5 steps with a railing? : A Lot 6 Click Score: 17    End of Session   Activity Tolerance: Patient tolerated treatment well Patient left: in chair;with call bell/phone within reach;with chair alarm set;with family/visitor present Nurse Communication: Mobility status PT Visit Diagnosis: Unsteadiness on feet (R26.81);Muscle weakness (generalized) (M62.81);Difficulty in walking, not elsewhere classified (R26.2)     Time: 0045-9977 PT Time Calculation (min) (ACUTE ONLY): 34 min  Charges:  $Gait Training: 8-22 mins                     05/03/2020  Adrian Singh., PT Acute Rehabilitation Services [phone]  (pager) [phone]  (office)   Adrian Singh Adrian Singh 05/03/2020, 1:30 PM

## 2020-05-03 NOTE — Progress Notes (Signed)
   05/03/20 1944  Assess: MEWS Score  Temp 97.7 F (36.5 C)  BP 95/67  Pulse Rate (!) 113  Level of Consciousness Alert  SpO2 97 %  O2 Device Nasal Cannula  Assess: MEWS Score  MEWS Temp 0  MEWS Systolic 1  MEWS Pulse 2  MEWS RR 0  MEWS LOC 0  MEWS Score 3  MEWS Score Color Yellow  Assess: if the MEWS score is Yellow or Red  Were vital signs taken at a resting state? Yes  Focused Assessment No change from prior assessment  Early Detection of Sepsis Score *See Row Information* Low  MEWS guidelines implemented *See Row Information* Yes  Treat  MEWS Interventions Administered scheduled meds/treatments  Pain Scale 0-10  Pain Score 2  Pain Type Acute pain  Pain Location Back  Pain Orientation Right  Pain Descriptors / Indicators Aching  Pain Frequency Intermittent  Pain Onset Progressive  Pain Intervention(s) Medication (See eMAR)  Take Vital Signs  Increase Vital Sign Frequency  Yellow: Q 2hr X 2 then Q 4hr X 2, if remains yellow, continue Q 4hrs  Escalate  MEWS: Escalate Yellow: discuss with charge nurse/RN and consider discussing with provider and RRT  Notify: Charge Nurse/RN  Name of Charge Nurse/RN Notified [place], RN  Date Charge Nurse/RN Notified 05/03/20  Time Charge Nurse/RN Notified 2000  Notify: Provider  Provider Name/Title n/a  Notify: Rapid Response  Name of Rapid Response RN Notified n/a  Document  Patient Outcome Stabilized after interventions   HR 113. Pt asymptomatic. Administered scheduled med. Yellow MEWS protocol initiated. Charge RN aware. Will monitor accordingly.

## 2020-05-03 NOTE — Progress Notes (Addendum)
PROGRESS NOTE    Adrian Singh  AST:[id number] DOB: 07-13-1934 DOA: 04/30/2020 PCP: [name], MD    Brief Narrative:  86 year old male with past medical history of paroxysmal atrial fibrillation (on Xarelto), coronary disease (s/p PCI with stent to circumflex), diastolic congestive heart failure (last echo 02/2016 EF 45-50%), diabetes mellitus type 2, hypertension, gastroesophageal reflux disease, prostate cancer, hyperlipidemia who presents to [facility] emergency department status post fall.  History is been obtained from both patient and the daughter who is at the bedside.  Patient explains that for the past 2 months he has been experiencing progressively worsening weakness.  Weakness is generalized but has been gradually become more and more severe over the span of time.  Patient also complains of some occasional intermittent left-sided chest discomfort although he states that this is not uncommon considering his history of coronary artery disease.  Patient denies dyspnea on exertion.  Patient does also endorse poor appetite over the same span of time with a 15 pound weight loss.  Patient is also complaining of a productive cough with clear sputum over the span of time but denies fevers, recent travel, sick contacts or confirmed contact with known COVID-19 infection.  Of note, patient has undergone COVID-19 vaccination with shots completed in early June and early July.  Patient's weakness continue to progress resulting in him falling in his backyard several weeks ago.  Yesterday evening, as the patient was attempting to get up off the toilet after using the bathroom he relates did not have the strength to completely rise from a seated position and fell to the floor.  The patient was too weak to get up, even with his wife attempting to help him throughout the evening.  The patient remained lying on the floor for essentially the entire evening until the patient's daughter came  to assist him the following morning.  Patient denies any loss of consciousness or head trauma with this fall.  According to the daughter who is at the bedside, she also noticed that the patient was having difficulty moving his left side shortly after arriving to help him.  She also endorses new onset slurring of his words since she found him this morning.  Upon evaluation in the emergency department, patient is complaining of moderate to severe sore musculoskeletal pain of the left chest wall, bilateral hips and legs.  X-rays of all of these areas is negative for fracture.  500 cc bolus was provided of isotonic fluids.  Due to the patient's progressive weakness and fall the hospitalist group has been called to assess patient for admission to the hospital.   Assessment & Plan:   Principal Problem:   Fall at home, initial encounter Active Problems:   Essential hypertension   Uncontrolled type 2 diabetes mellitus with diabetic polyneuropathy, without long-term current use of insulin (HCC)   PAF (paroxysmal atrial fibrillation) (HCC)   Atherosclerosis of native coronary artery of native heart with angina pectoris ([place])   Primary malignant neoplasm of prostate metastatic to bone [facility])   Generalized weakness   Acute on chronic anemia   Elevated CK   Chronic kidney disease, stage 3a   Elevated troponin level not due myocardial infarction   Stroke [facility])  Principal Problem:   Fall at home, initial encounter, likely secondary to acute CVA  MRI personally reviewed. Findings of 50mm acute ischemic R frontal centrum semi ovale infarct as well as 1.5-2cm lesion involving R corpus callosum, likely evolving subactue infarct, see below  PT/OT/SLP consulted  Pt has hx afib, presented with hgb 7.7 with no evidence of acute blood loss. Home xarelto held at time of presentation, now on heparin gtt  Have consulted Neurology, will f/u on recs  2d echo performed, pending results  Carotid dopplers  ordered and reviewed, findings of 40-59% R ICA stenosis  Seen by neurology. Initial recommendation for heparin gtt. Per Neuro, concerns for possible metastatic foci on imaging instead of CVA. Have discussed with Neurosurgeon on call who reviewed images. Per Neurosurgeon, radiographic findings did not seem consistent with neoplastic disease with recommendation for f/u MRI later. No indication for biopsy or additional workup per Neurosurgeon  PT/OT consulted, f/u recs, thus far rec for SNF  Appreciate input by pt's PCP, Dr. Silvio Pate. Overall poor prognosis. Agree with discussion about palliative care, have consulted  Discussed with Neurology today. OK to resume oral anticoagulant. Will reed repeat imaging in 1-2 months  Active Problems:   Generalized weakness  PT/OT consulted per above with recommendation for SNF    Essential hypertension  Held ramapril to allow more permissive HTN given acute CVA  Continuing metoprolol for rate control given hx afib    Uncontrolled type 2 diabetes mellitus with diabetic polyneuropathy, without long-term current use of insulin (HCC)  Accu-Cheks before every meal and nightly with sliding scale insulin  Holding home regimen of oral hypoglycemics  Glucose trends currently remains stable    PAF (paroxysmal atrial fibrillation) (HCC)  Continue home regimen of AV nodal blocking therapy  Patient is seemingly currently rate controlled  Monitoring patient on telemetry for now  Currently on heparin gtt per above. Per Neurology, OK to resume oral anticoagulant. Would resume when pt clears SLP re-eval   Elevated troponin not due to myocardial infarction  Slightly elevated troponin without notable EKG changes and atypical reproducible chest discomfort  Patient is unlikely to be suffering from plaque rupture  Chest discomfort likely secondary to recent fall  Stable currently at this time  Acute on chronic anemia, microcytic  Holding  Xarelto  Stool neg for blood  Iron studies pending  Chart reviewed. Pt is s/p colonoscopy 2011 with finding of benign polyp with recommendation for repeat screening colon 2021  Would have pt f/u for outpt work up on d/c    Elevated CK  Slightly elevated creatinine kinase  Hydrate patient with intravenous isotonic fluids to avoid rhabdomyolysis  Performing serial creatine kinase levels to ensure downtrending and resolution.  Repeat bmet in AM    Chronic kidney disease, stage 3a  Creatinine is essentially at baseline  Strict input and output monitoring  Cont to avoid nephrotoxic agents  repeat bmet in AM    Primary malignant neoplasm of prostate metastatic to bone [facility])  See above. Concern for acute CVA vs neoplastic process in setting of metastatic prostate cancer  Given multitude of issues and acute CVA/neoplastic lesion, have consulted Palliative Care to establish goals of care. Pt and family in agreement  Acute hypoxemic respiratory failure -Overnight events noted. Pt noted to have acutely increased respiratory effort, noted to have difficulty with suctioning secretions -Staff with concerns of possible aspiration.  -Have re-consulted SLP. Currently NPO -CXR reviewed, decreased inspiratory effort  DVT prophylaxis: SCD's Code Status: Full Family Communication: Pt in room, family at bedside  Status is: Inpatient  The patient will require care spanning > 2 midnights and should be moved to inpatient because: Ongoing diagnostic testing needed not appropriate for outpatient work up, Unsafe d/c plan and Inpatient level of  care appropriate due to severity of illness  Dispo: The patient is from: Home              Anticipated d/c is to: SNF              Anticipated d/c date is: 2 days              Patient currently is not medically stable to d/c.  Consultants:   Neurology  Procedures:     Antimicrobials: Anti-infectives (From admission, onward)   None        Subjective: States R sided mouth pain much improved with magic mouthwash  Objective: Vitals:   05/03/20 0037 05/03/20 0342 05/03/20 0734 05/03/20 1218  BP: (!) 171/84 (!) 153/90 (!) 148/75 (!) 105/57  Pulse: 81 76 84 88  Resp: 20 20 18 18   Temp: 97.6 F (36.4 C) 97.7 F (36.5 C) 98.3 F (36.8 C) 98 F (36.7 C)  TempSrc: Oral Oral Oral   SpO2: 100% 98% 99% 96%  Weight:  73.5 kg    Height:        Intake/Output Summary (Last 24 hours) at 05/03/2020 1635 Last data filed at 05/03/2020 0400 Gross per 24 hour  Intake 2251.6 ml  Output 200 ml  Net 2051.6 ml   Filed Weights   05/01/20 0419 05/02/20 0008 05/03/20 0342  Weight: 72.1 kg 72.6 kg 73.5 kg    Examination: General exam: Conversant, in no acute distress Respiratory system: normal chest rise, clear, no audible wheezing Cardiovascular system: regular rhythm, s1-s2 Gastrointestinal system: Nondistended, nontender, pos BS Central nervous system: No seizures, no tremors Extremities: No cyanosis, no joint deformities Skin: No rashes, no pallor Psychiatry: Affect normal // no auditory hallucinations   Data Reviewed: I have personally reviewed following labs and imaging studies  CBC: Recent Labs  Lab 04/29/20 1218 04/30/20 1637 05/01/20 0059 05/02/20 0432 05/03/20 0402  WBC 6.6 10.2 8.1 8.4 9.9  NEUTROABS  --  7.5 6.0  --   --   HGB 8.5 Repeated and verified X2.* 8.1* 7.7* 8.1* 8.4*  HCT 26.7 Repeated and verified X2.* 27.9* 24.9* 26.4* 27.3*  MCV 75.0* 79.3* 75.9* 76.5* 77.6*  PLT 224.0 229 213 193 295   Basic Metabolic Panel: Recent Labs  Lab 04/29/20 1218 04/30/20 1637 05/01/20 0059 05/02/20 0432 05/03/20 0402  NA 136 138 138 133* 138  K 4.7 3.4* 3.2* 3.3* 3.5  CL 101 104 106 102 106  CO2 25 22 22 22  20*  GLUCOSE 245* 98 126* 96 115*  BUN 20 25* 21 13 15   CREATININE 1.07 1.32* 1.00 0.80 0.85  CALCIUM 8.2* 8.3* 7.7* 7.2* 7.2*  MG  --   --  2.0  --   --    GFR: Estimated Creatinine Clearance:  63.7 mL/min (by C-G formula based on SCr of 0.85 mg/dL). Liver Function Tests: Recent Labs  Lab 04/29/20 1218 05/01/20 0059 05/02/20 0432 05/03/20 0402  AST 21 53* 51* 53*  ALT 7 18 19 20   ALKPHOS 183* 159* 185* 192*  BILITOT 0.5 0.7 0.7 1.0  PROT 6.5 6.6 6.4* 6.6  ALBUMIN 2.9* 2.0* 2.0* 1.9*   No results for input(s): LIPASE, AMYLASE in the last 168 hours. No results for input(s): AMMONIA in the last 168 hours. Coagulation Profile: Recent Labs  Lab 04/30/20 1930  INR 1.4*   Cardiac Enzymes: Recent Labs  Lab 04/30/20 1637 05/01/20 0059  CKTOTAL 981* 717*   BNP (last 3 results) No results for input(s):  PROBNP in the last 8760 hours. HbA1C: Recent Labs    04/30/20 1637  HGBA1C 7.5*   CBG: Recent Labs  Lab 05/02/20 1616 05/02/20 2101 05/03/20 0655 05/03/20 1116 05/03/20 1555  GLUCAP 110* 130* 99 84 110*   Lipid Profile: No results for input(s): CHOL, HDL, LDLCALC, TRIG, CHOLHDL, LDLDIRECT in the last 72 hours. Thyroid Function Tests: Recent Labs    04/30/20 1930  TSH 1.164   Anemia Panel: Recent Labs    04/30/20 1930 05/01/20 0103  VITAMINB12 1,350*  --   FOLATE 37.5  --   TIBC  --  238*  IRON  --  13*   Sepsis Labs: Recent Labs  Lab 04/30/20 1930  LATICACIDVEN 1.2    Recent Results (from the past 240 hour(s))  SARS Coronavirus 2 by RT PCR (hospital order, performed in [facility] hospital lab) Nasopharyngeal Nasopharyngeal Swab     Status: None   Collection Time: 04/30/20  7:30 PM   Specimen: Nasopharyngeal Swab  Result Value Ref Range Status   SARS Coronavirus 2 NEGATIVE NEGATIVE Final    Comment: (NOTE) SARS-CoV-2 target nucleic acids are NOT DETECTED.  The SARS-CoV-2 RNA is generally detectable in upper and lower respiratory specimens during the acute phase of infection. The lowest concentration of SARS-CoV-2 viral copies this assay can detect is 250 copies / mL. A negative result does not preclude SARS-CoV-2 infection and should  not be used as the sole basis for treatment or other patient management decisions.  A negative result may occur with improper specimen collection / handling, submission of specimen other than nasopharyngeal swab, presence of viral mutation(s) within the areas targeted by this assay, and inadequate number of viral copies (<250 copies / mL). A negative result must be combined with clinical observations, patient history, and epidemiological information.  Fact Sheet for Patients:   StrictlyIdeas.no  Fact Sheet for Healthcare Providers: BankingDealers.co.za  This test is not yet approved or  cleared by the [place] FDA and has been authorized for detection and/or diagnosis of SARS-CoV-2 by FDA under an Emergency Use Authorization (EUA).  This EUA will remain in effect (meaning this test can be used) for the duration of the COVID-19 declaration under Section 564(b)(1) of the Act, 21 U.S.C. section 360bbb-3(b)(1), unless the authorization is terminated or revoked sooner.  Performed at [facility], [place] [address] [place], [place] [address]   Urine Culture     Status: Abnormal   Collection Time: 04/30/20  7:30 PM   Specimen: Urine, Random  Result Value Ref Range Status   Specimen Description URINE, RANDOM  Final   Special Requests NONE  Final   Culture (A)  Final    <10,000 COLONIES/mL INSIGNIFICANT GROWTH Performed at [facility], [place] [address] [place], [place] [address]    Report Status 05/01/2020 FINAL  Final     Radiology Studies: CT CHEST ABDOMEN PELVIS W CONTRAST  Result Date: 05/01/2020 CLINICAL DATA:  Looking for malignancy in setting of brain mass, poss metastatic lesion Patient has history of prostate cancer. EXAM: CT CHEST, ABDOMEN, AND PELVIS WITH CONTRAST TECHNIQUE: Multidetector CT imaging of the chest, abdomen and pelvis was performed following the standard protocol during bolus administration of  intravenous contrast. CONTRAST:  168mL OMNIPAQUE IOHEXOL 300 MG/ML  SOLN COMPARISON:  Abdominal CT 09/14/2019, abdominal CT 02/05/2017. No prior cross-sectional imaging of the chest. No dominant pulmonary mass. Scattered subsegmental atelectasis in the left greater than right lower lobe. Compressive atelectasis in the lung  bases related to hiatal hernia. No findings of pulmonary edema. No pleural fluid. Trachea and central bronchi are patent. FINDINGS: CT CHEST FINDINGS Cardiovascular: Aortic atherosclerosis. No aortic aneurysm or dissection. Common origin of the brachiocephalic and left common carotid artery. Aortic branch vessels are tortuous. Heart is normal in size. Coronary artery calcifications versus stent. No pericardial effusion. Mediastinum/Nodes: Moderate hiatal hernia. Mild wall thickening at the gastroesophageal junction. The upper esophagus is patulous. No visualized thyroid nodule. No enlarged mediastinal or hilar lymph nodes. No axillary adenopathy. Lungs/Pleura: 5 mm medial right lower lobe pulmonary nodule in the inferior most right lower lobe series 4, image 92, stable from prior exam. Ill-defined perifissural nodule in the right lower lobe measuring 6 mm, series 4, image 69. Musculoskeletal: Multifocal sclerotic lesions throughout the skeleton. This includes the right proximal humerus, right scapula, both clavicles, multiple ribs and throughout the thoracic spine. Two 6 and T7 vertebral bodies are diffusely sclerotic. Diffuse degenerative change throughout the thoracic spine. There is no evidence of pathologic fracture. CT ABDOMEN PELVIS FINDINGS Hepatobiliary: No focal hepatic lesion. Gallbladder is unremarkable. No biliary dilatation. Pancreas: Parenchymal atrophy. Low-density lesion involving the pancreatic head measures 2 cm, grossly stable from immediate prior exam, however motion artifact obscures detailed assessment. Few pancreatic head calcifications. No peripancreatic fat stranding. No  ductal dilatation. Spleen: Low-density lesion in the splenic dome, series 3, image 38, partially obscured, but grossly unchanged from prior exam. Normal in size. Adrenals/Urinary Tract: No adrenal nodule. No hydronephrosis or perinephric edema. Homogeneous renal enhancement with symmetric excretion on delayed phase imaging. There is bilateral renal cortical scarring and small low-density renal cortical lesions in both kidneys, too small to characterize but likely cysts. No evidence of suspicious renal lesion. Urinary bladder is physiologically distended without wall thickening. Stomach/Bowel: Moderate hiatal hernia. No gastric wall thickening or evidence of mass. Normal positioning of the duodenum and ligament of Treitz. No small bowel obstruction, administered enteric contrast reaches the colon. No bowel wall thickening. The appendix is not well visualized on the current exam. Mild gaseous colonic distension without colonic wall thickening. Sigmoid colon is tortuous and courses into the upper abdomen. Scattered small volume of colonic stool. There is no colonic wall thickening or evidence of colonic mass. Vascular/Lymphatic: Aortic atherosclerosis. No aortic aneurysm. Portal vein is patent. Splenic artery is tortuous. No acute vascular findings. No enlarged lymph nodes in the abdomen or pelvis. Reproductive: Prostate gland normal in size. The previous enhancing focus in the right aspect of the gland is not well visualized currently. There is mild mass effect of the left aspect of the prostate on the bladder base. Other: No ascites.  No omental thickening or omental disease. Musculoskeletal: Multifocal sclerotic osseous metastatic disease throughout the pelvis and spine. The degree of bony sclerosis involving the right iliac bone is progressed from prior, for example series 3, image 88. There is also progression involving the left iliac bone, series 3, image 87. There is no evidence of pathologic fracture.  IMPRESSION: 1. Multifocal sclerotic osseous metastatic disease throughout the chest, abdomen, and pelvis, patient with known metastatic prostate cancer. Progressive bony sclerosis involving the right and left iliac bones from December suggests progression of osseous metastatic disease. 2. Stable 5 mm right lower lobe pulmonary nodule. There is a 6 mm perifissural nodule in the right lower lobe that was not previously included in the field of view, nonspecific. This may represent an intrapulmonary lymph node, however recommend attention on follow-up. 3. Low-density lesion in the pancreatic head is  grossly stable from immediate prior exam, however motion artifact obscures detailed assessment currently. 4. Moderate hiatal hernia. Aortic Atherosclerosis (ICD10-I70.0). Electronically Signed   By: Keith Rake M.D.   On: 05/01/2020 20:05   DG Chest Port 1 View  Result Date: 05/03/2020 CLINICAL DATA:  Respiratory distress EXAM: PORTABLE CHEST 1 VIEW COMPARISON:  04/30/2020 FINDINGS: Shallow lung inflation with left basilar atelectasis. No focal airspace consolidation. No pleural effusion or pneumothorax. IMPRESSION: Shallow lung inflation with left basilar atelectasis. Electronically Signed   By: Ulyses Jarred M.D.   On: 05/03/2020 00:43   CT MAXILLOFACIAL WO CONTRAST  Result Date: 05/02/2020 CLINICAL DATA:  Initial evaluation for acute facial trauma, jaw pain. EXAM: CT MAXILLOFACIAL WITHOUT CONTRAST TECHNIQUE: Multidetector CT imaging of the maxillofacial structures was performed. Multiplanar CT image reconstructions were also generated. COMPARISON:  None available. FINDINGS: Osseous: Zygomatic arches intact. No acute maxillary fracture. Pterygoid plates intact. Nasal bones intact. Trace left-to-right nasal septal deviation without fracture. Mandible intact without fracture or other abnormality. Mandibular condyles are normally situated. Patient is edentulous. Few sclerotic foci in noted within the left  lateral mass of C3 as well as the C3 vertebral body, consistent with history of metastatic prostate cancer. Possible additional sclerotic focus noted at the tip of the dens. Orbits: Globes and orbital soft tissues within normal limits. Bony orbits intact. Sinuses: Mild scattered mucoperiosteal thickening present about the ethmoidal air cells and maxillary sinuses. Paranasal sinuses are otherwise clear. No air-fluid levels to suggest acute sinusitis. Visualized mastoids and middle ear cavities are well pneumatized and free of fluid. Probable cerumen noted within the left EAC. Soft tissues: No visible soft tissue swelling or other abnormality about the face. Salivary glands within normal limits. No adenopathy within the visualized neck. Atheromatous plaque noted about the right greater than left carotid bifurcations. Limited intracranial: Previously identified masslike lesion involving the posterior right frontal white matter/corpus callosum faintly visible, better seen on recent MRIs. Previously identified subcentimeter infarct not well seen. Visualized portions of the brain demonstrate no other new or acute abnormality. IMPRESSION: 1. No acute maxillofacial injury identified. Specifically, no fracture or other abnormality about the mandible. 2. Few sclerotic foci involving the C3 vertebral body and possibly the tip of the dens, consistent with history of metastatic prostate cancer. 3. Previously identified masslike lesion involving the posterior right frontal white matter/corpus callosum faintly visible, better evaluated on recent MRIs. Electronically Signed   By: Jeannine Boga M.D.   On: 05/02/2020 02:36    Scheduled Meds: . atorvastatin  20 mg Oral Daily  . doxazosin  1 mg Oral QHS  . [START ON 05/04/2020] feeding supplement (NEPRO CARB STEADY)  237 mL Oral BID BM  . insulin aspart  0-15 Units Subcutaneous TID AC & HS  . metoprolol tartrate  12.5 mg Oral BID  . multivitamin with minerals  1 tablet  Oral Daily   Continuous Infusions: . sodium chloride 75 mL/hr at 05/02/20 1159  . heparin 1,150 Units/hr (05/03/20 1043)     LOS: 1 day   Marylu Lund, MD [facility] Pager On Amion  If 7PM-7AM, please contact night-coverage 05/03/2020, 4:35 PM

## 2020-05-03 NOTE — Progress Notes (Signed)
ANTICOAGULATION CONSULT NOTE - Follow Up Consult  Pharmacy Consult for Heparin Indication: atrial fibrillation  Allergies  Allergen Reactions  . Soma [Carisoprodol] Other (See Comments)    "did a number on me"  . Doxazosin Other (See Comments)    dizziness    Patient Measurements: Height: 5\' 8"  (172.7 cm) Weight: 73.5 kg (162 lb) IBW/kg (Calculated) : 68.4 Heparin Dosing Weight:  72.6kg  Vital Signs: Temp: 98.3 F (36.8 C) (08/17 0734) Temp Source: Oral (08/17 0734) BP: 148/75 (08/17 0734) Pulse Rate: 84 (08/17 0734)  Labs: Recent Labs    04/30/20 1637 04/30/20 1637 04/30/20 1930 05/01/20 0059 05/01/20 0059 05/02/20 0432 05/02/20 2007 05/03/20 0402  HGB 8.1*   < >  --  7.7*   < > 8.1*  --  8.4*  HCT 27.9*   < >  --  24.9*  --  26.4*  --  27.3*  PLT 229   < >  --  213  --  193  --  211  APTT  --   --  37*  --   --   --  88*  --   LABPROT  --   --  16.3*  --   --   --   --   --   INR  --   --  1.4*  --   --   --   --   --   HEPARINUNFRC  --   --   --   --   --   --  0.32 0.33  CREATININE 1.32*   < >  --  1.00  --  0.80  --  0.85  CKTOTAL 981*  --   --  717*  --   --   --   --   TROPONINIHS  --   --  19* 19*  --   --   --   --    < > = values in this interval not displayed.    Estimated Creatinine Clearance: 63.7 mL/min (by C-G formula based on SCr of 0.85 mg/dL).   Assessment: 86 yo M with afib on xarelto pta (LD 8/13), now held for neuro w/u and dysphagia. FOBT neg.   HL 0.33 at low end of goal. H/H, plt stable. No reported bleeding. Will increase slightly to keep in goal.    Goal of Therapy:  Heparin level 0.3-0.7 units/ml Monitor platelets by anticoagulation protocol: Yes   Plan:  Increase heparin to 1150 units/hr, Monitor daily HL, CBC/plt Per MD, will switch back to rivaroxaban if CTH stable and can swallow safely    Benetta Spar, PharmD, BCPS, [facility] Clinical Pharmacist  Please check AMION for all [place] phone numbers After 10:00 PM, call  [place]

## 2020-05-03 NOTE — Significant Event (Signed)
HOSPITAL MEDICINE OVERNIGHT EVENT NOTE  Notified by nursing that patient has developed increasingly coarse, labored breathing.  Attempts have been made to suction patient's thick secretions from the back of his airway.  Patient additionally became hypoxic rather rapidly.  Patient required increasing amounts of supplemental O2 and eventually had been transitioned to 100% NRB by RT and RRT due to persisting hypoxia.  I was contacted by RRT and the decision was made to obtain a CXR and ABG.    I evaluated the patient at the bedside.  Patient is still on NRB at 100%.  Patient is lethargic but arousable with significant skin pallor.  While patient is exhibiting harsh breath sounds upon observation and ascultation - it seems that the majority of these sounds are coming from the upper airway due to secretions.  CXR reviewed - Poor inspiratory effort with atelectasis.  ABG is essentially normal with an extremely high PaO2 above 300 due to the supplemental O2.  I have instructed nursing to suction the patient regularly as I believe most of this is excessive airway secretions. I am placing an order for vest therapy.  I have asked that the patient's supplemental o2 be quickly deescelated to maintain target saturations of 92-96%.    Adrian Singh

## 2020-05-03 NOTE — Significant Event (Signed)
Rapid Response Event Note   Reason for Call :  Called d/t SPO2-80s on RA. Pt placed on NRB with SpO2 increasing to 100%.  Initial Focused Assessment:  Pt laying in bed with eyes closed. Pt will wake up, is oriented to name and place, and will follow commands. When not stimulated, pt falls asleep easily. Lungs with rhonchi/rales t/o. Breathing congested. Skin warm and dry. T-97.6, HR-81, BP-171/84, RR-20, SpO2-100% on NRB     Interventions:  MIVF turned off(NS @ 75cc/hr) NRB ABG- PCXR-Shallow lung inflation with left basilar atelectasis.  Plan of Care:  Await ABG/PCXR results and notify MD. Lasix may be beneficial. May wean NRB if SpO2 and WOB allow. Call RRT if further assistance needed.    Event Summary:   MD Notified: Dr. Cyd Silence notified at [place] [facility] [place] Time:0015(already on unit) End [id number]  Dillard Essex, RN

## 2020-05-04 ENCOUNTER — Inpatient Hospital Stay (HOSPITAL_COMMUNITY): Payer: Medicare Other

## 2020-05-04 DIAGNOSIS — E1142 Type 2 diabetes mellitus with diabetic polyneuropathy: Secondary | ICD-10-CM

## 2020-05-04 DIAGNOSIS — R748 Abnormal levels of other serum enzymes: Secondary | ICD-10-CM

## 2020-05-04 DIAGNOSIS — R531 Weakness: Secondary | ICD-10-CM

## 2020-05-04 DIAGNOSIS — L8990 Pressure ulcer of unspecified site, unspecified stage: Secondary | ICD-10-CM | POA: Insufficient documentation

## 2020-05-04 DIAGNOSIS — D649 Anemia, unspecified: Secondary | ICD-10-CM

## 2020-05-04 DIAGNOSIS — C61 Malignant neoplasm of prostate: Secondary | ICD-10-CM

## 2020-05-04 DIAGNOSIS — I480 Paroxysmal atrial fibrillation: Secondary | ICD-10-CM

## 2020-05-04 DIAGNOSIS — R778 Other specified abnormalities of plasma proteins: Secondary | ICD-10-CM

## 2020-05-04 DIAGNOSIS — E1165 Type 2 diabetes mellitus with hyperglycemia: Secondary | ICD-10-CM

## 2020-05-04 DIAGNOSIS — C7951 Secondary malignant neoplasm of bone: Secondary | ICD-10-CM

## 2020-05-04 DIAGNOSIS — L89109 Pressure ulcer of unspecified part of back, unspecified stage: Secondary | ICD-10-CM | POA: Insufficient documentation

## 2020-05-04 DIAGNOSIS — I10 Essential (primary) hypertension: Secondary | ICD-10-CM

## 2020-05-04 DIAGNOSIS — N1831 Chronic kidney disease, stage 3a: Secondary | ICD-10-CM

## 2020-05-04 LAB — COMPREHENSIVE METABOLIC PANEL
ALT: 18 U/L (ref 0–44)
AST: 62 U/L — ABNORMAL HIGH (ref 15–41)
Albumin: 1.7 g/dL — ABNORMAL LOW (ref 3.5–5.0)
Alkaline Phosphatase: 250 U/L — ABNORMAL HIGH (ref 38–126)
Anion gap: 9 (ref 5–15)
BUN: 22 mg/dL (ref 8–23)
CO2: 22 mmol/L (ref 22–32)
Calcium: 6.9 mg/dL — ABNORMAL LOW (ref 8.9–10.3)
Chloride: 108 mmol/L (ref 98–111)
Creatinine, Ser: 0.86 mg/dL (ref 0.61–1.24)
GFR calc Af Amer: >60 mL/min (ref >60)
GFR calc non Af Amer: >60 mL/min (ref >60)
Glucose, Bld: 252 mg/dL — ABNORMAL HIGH (ref 70–99)
Potassium: 3.1 mmol/L — ABNORMAL LOW (ref 3.5–5.1)
Sodium: 139 mmol/L (ref 135–145)
Total Bilirubin: 0.6 mg/dL (ref 0.3–1.2)
Total Protein: 6.1 g/dL — ABNORMAL LOW (ref 6.5–8.1)

## 2020-05-04 LAB — HEPARIN LEVEL (UNFRACTIONATED): Heparin Unfractionated: 0.3 IU/mL (ref 0.30–0.70)

## 2020-05-04 LAB — CBC
HCT: 24.6 % — ABNORMAL LOW (ref 39.0–52.0)
Hemoglobin: 7.5 g/dL — ABNORMAL LOW (ref 13.0–17.0)
MCH: 23.3 pg — ABNORMAL LOW (ref 26.0–34.0)
MCHC: 30.5 g/dL (ref 30.0–36.0)
MCV: 76.4 fL — ABNORMAL LOW (ref 80.0–100.0)
Platelets: 184 10*3/uL (ref 150–400)
RBC: 3.22 MIL/uL — ABNORMAL LOW (ref 4.22–5.81)
RDW: 17.8 % — ABNORMAL HIGH (ref 11.5–15.5)
WBC: 6.6 10*3/uL (ref 4.0–10.5)
nRBC: 0 % (ref 0.0–0.2)

## 2020-05-04 LAB — GLUCOSE, CAPILLARY
Glucose-Capillary: 132 mg/dL — ABNORMAL HIGH (ref 70–99)
Glucose-Capillary: 140 mg/dL — ABNORMAL HIGH (ref 70–99)
Glucose-Capillary: 227 mg/dL — ABNORMAL HIGH (ref 70–99)
Glucose-Capillary: 156 mg/dL — ABNORMAL HIGH (ref 70–99)
Glucose-Capillary: 166 mg/dL — ABNORMAL HIGH (ref 70–99)

## 2020-05-04 MED ORDER — METOPROLOL TARTRATE 12.5 MG HALF TABLET
12.5000 mg | ORAL_TABLET | Freq: Once | ORAL | Status: AC
  Administered 2020-05-04: 12.5 mg via ORAL
  Filled 2020-05-04: qty 1

## 2020-05-04 MED ORDER — LACTATED RINGERS IV BOLUS
250.0000 mL | Freq: Once | INTRAVENOUS | Status: AC
  Administered 2020-05-04: 250 mL via INTRAVENOUS

## 2020-05-04 MED ORDER — POTASSIUM CHLORIDE 10 MEQ/100ML IV SOLN
10.0000 meq | INTRAVENOUS | Status: AC
  Administered 2020-05-04 (×4): 10 meq via INTRAVENOUS
  Filled 2020-05-04 (×4): qty 100

## 2020-05-04 MED ORDER — RIVAROXABAN 20 MG PO TABS
20.0000 mg | ORAL_TABLET | Freq: Every day | ORAL | Status: DC
  Administered 2020-05-04 – 2020-05-12 (×9): 20 mg via ORAL
  Filled 2020-05-04 (×9): qty 1

## 2020-05-04 NOTE — Progress Notes (Signed)
Modified Barium Swallow Progress Note  Patient Details  Name: Adrian Singh MRN: [id number] Date of Birth: 09-Feb-1934  Today's Date: 05/04/2020  Modified Barium Swallow completed.  Full report located under Chart Review in the Imaging Section.  Brief recommendations include the following:  Clinical Impression  Pt demonstrates a severe oropharyngeal dysphagia, likely secondary to a primary esophageal impairment. Pt has relatively good oral phase, but there are instances of slight delay in swallow initiation with frank penetration prior to full laryngeal closure. Of greater concern is the mild vallecular and moderate pyriform residue present consistently with all liquids despite adequate appearing laryngeal elevation, hyoid excursion and base of tongue retraction. Esophageal sweep shows concern for a significant motility impairement with severe residue and suspected esophageal spasm (no radiologist present to confirm). Poor esophageal peristalsis can impact appropriate negative pressure in the pharynx, which is suspected in this case. Regardless of liquid texture, positional strategies, or compensatory strategies, pt has  mild penetration after the swallow, leading to eventual silent aspiration. If pt can clear his throat preventatively, he will improve his airway protection. Recommend thin liquids as there is risk across textures. Pt is capable of mech soft orally, but esophageal clearance of solids may be difficult. Recommend pt continue purees for now with thin liquids. Will f/u for tolerance and pt/family education.    Swallow Evaluation Recommendations               Medication Administration: Crushed with puree   Supervision: Patient able to self feed   Compensations: Clear throat after each swallow;Multiple dry swallows after each bite/sip               Herbie Baltimore, MA [place] Pager [phone] Office [phone]  Lynann Beaver 05/04/2020,11:33 AM

## 2020-05-04 NOTE — Progress Notes (Signed)
ANTICOAGULATION CONSULT NOTE - Follow Up Consult  Pharmacy Consult for Heparin transtion back to Xarelto Indication: atrial fibrillation  Allergies  Allergen Reactions  . Soma [Carisoprodol] Other (See Comments)    "did a number on me"  . Doxazosin Other (See Comments)    dizziness    Patient Measurements: Height: 5\' 8"  (172.7 cm) Weight: 73.9 kg (163 lb) IBW/kg (Calculated) : 68.4 Heparin Dosing Weight:  72.6kg  Vital Signs: Temp: 97.9 F (36.6 C) (08/18 1142) Temp Source: Oral (08/18 1142) BP: 107/67 (08/18 1142) Pulse Rate: 120 (08/18 1142)  Labs: Recent Labs    05/02/20 0432 05/02/20 0432 05/02/20 2007 05/03/20 0402 05/04/20 1224  HGB 8.1*   < >  --  8.4* 7.5*  HCT 26.4*  --   --  27.3* 24.6*  PLT 193  --   --  211 184  APTT  --   --  88*  --   --   HEPARINUNFRC  --   --  0.32 0.33 0.30  CREATININE 0.80  --   --  0.85 0.86   < > = values in this interval not displayed.    Estimated Creatinine Clearance: 63 mL/min (by C-G formula based on SCr of 0.86 mg/dL).   Assessment: 86 yo M with afib on xarelto pta (LD 8/13), held for neuro w/u and dysphagia. FOBT neg.   On 05/04/20 the heparin level is 0.30, therapeutic on heparin drip rate 1150 units/hr.  Hgb down to 7.5, has been in 7s-8s range and  pltc within normal/stable. No reported bleeding. 8/18 SLP evaluation/ recommendations noted: Diet: dysphagia 1 puree, thin liq.  Med administration: crushed with puree.   MD noted on 8/17 neurology has okay'd restart of oral anticoagulant.   Pharmacy consulted to transition back to Xarelto for history of Afib.  CrCl 63 ml/min.    Goal of Therapy:  Heparin level 0.3-0.7 units/ml Monitor platelets by anticoagulation protocol: Yes   Plan:  Discontinue heparin drip now and restart Xarelto 20 mg daily, give dose at time of heparin discontinuation. May crush Xarelto.  Monitor for signs/symptoms of bleeding     Nicole Cella, RPh Clinical Pharmacist Please check AMION  for all [place] phone numbers After 10:00 PM, call [place] [phone]

## 2020-05-04 NOTE — Progress Notes (Signed)
HR sustaining 110-120's at rest. BP 100/61.  Pt asymptomatic. MD paged. Ordered LR 250cc bolus and  metoprolol given. Pt has no complains at this time. Will continue to monitor.

## 2020-05-04 NOTE — Progress Notes (Addendum)
PROGRESS NOTE  Adrian Singh ZJQ:[id number] DOB: 02-Jul-1934 DOA: 04/30/2020 PCP: [name], MD   LOS: 2 days   Brief narrative: As per HPI,  86 year old male with past medical history of paroxysmal atrial fibrillation(on Xarelto),coronary disease(s/p PCI with stent to circumflex),diastolic congestive heart failure(last echo 02/2016 EF 45-50%),diabetes mellitus type 2, hypertension, gastroesophageal reflux disease, prostate cancer, hyperlipidemia presented to [facility] emergency department status post fall.  Patient has been experiencing progressive weakness over the last 2 months.  He also had dyspnea on exertion and chest discomfort with poor appetite and weight loss.  Had completed Covid vaccination as outpatient.  Due to progressive weakness, patient had a fall without loss of consciousness. Upon evaluation in the emergency department, patient complained of moderate to severe musculoskeletal pain of the left chest wall, bilateral hips and legs. X-rays of all of these areas were negative for fracture. 500 cc bolus of IV fluid was given and the patient was then admitted to hospital for further evaluation and treatment.  Assessment/Plan:  Principal Problem:   Fall at home, initial encounter Active Problems:   Essential hypertension   Uncontrolled type 2 diabetes mellitus with diabetic polyneuropathy, without long-term current use of insulin (HCC)   PAF (paroxysmal atrial fibrillation) (HCC)   Atherosclerosis of native coronary artery of native heart with angina pectoris ([place])   Primary malignant neoplasm of prostate metastatic to bone [facility])   Generalized weakness   Acute on chronic anemia   Elevated CK   Chronic kidney disease, stage 3a   Elevated troponin level not due myocardial infarction   Stroke ([place])   Pressure injury of skin   Fall at home, initial encounter, likely secondary to acute CVA Initial CT scan of the head showed generalized cerebral  atrophy.  MRI was done which showed 5 mm right frontal  centrum semi ovale infarct as well as 1.5-2cm lesion involving R corpus callosum,infarct.  Patient does have history of atrial fibrillation on Xarelto at baseline.  Neurology was consulted who advised a neurosurgical consultation for possible metastatic foci.  Neurosurgery did not feel that way and recommended MRI later on.  Physical therapy interventional therapies and the patient and recommended skilled nursing facility placement.  Patient does have full functional status and the patient's primary care had communicated with the previous provider.  There has been some discussion about palliative care which has been consulted.  Neurology has recommended to resume oral anticoagulant at this time if okay with swallow evaluation.  Currently on heparin drip.  Patient has been transitioned to dysphagia 1 diet.  Will start [place] to dose.  Patient will need repeat imaging in 1 to 2 months.  Generalized weakness Has been seen by physical therapy who recommended skilled nursing facility placement on discharge.  Hypokalemia.  Potassium 3.1 today.  Will replenish via IV.  Check levels in a.m.  Essential hypertension Overall metoprolol has been continued for history of A. fib.  ACE inhibitor is on hold for permissive hypertension.  Uncontrolled type 2 diabetes mellitus with diabetic polyneuropathy,  On glimepiride and Metformin at home.  Continue on sliding scale insulin, Accu-Cheks, diabetic  diet while in hospital.  Currently on dysphagia 1 diet.  paroxysmal atrial fibrillation. Mild tachycardia noted..  Continue metoprolol.  On heparin drip as well.  Will change to xarelto, home regimen.  Elevated troponin, likely demand ischemia.  Chest pain due to recent fall.  Acute on chronic anemia, microcytic With low iron noted in iron profile.  Stool occult was negative.  Patient did have colonoscopy 2011 with benign polyp and  is due for repeat screening 2021.  No active bleeding.  On heparin drip at this time.  Chronic kidney disease, stage 3a Creatinine of 0.8.  Will closely monitor.  Likely at baseline.  Check BMP in a.m.  Metastatic prostate cancer to the bone.  Concern for acute CVA vs neoplastic process in setting of metastatic prostate cancer.  Palliative care has been consulted to assist with goals of care.  Severe hypoalbuminemia and failure to thrive.  Acute hypoxemic respiratory failure Possibly secondary to aspiration.  Currently on dysphagia 1 diet.    No indication for antibiotic at this time.  Temperature max of 97.9.  Stage I pressure ulcer to the coccyx -present on admission, continue preventive protocol.  Pressure Injury 05/03/20 Coccyx Stage 1 -  Intact skin with non-blanchable redness of a localized area usually over a bony prominence. pink (Active)  05/03/20 0908  Location: Coccyx  Location Orientation:   Staging: Stage 1 -  Intact skin with non-blanchable redness of a localized area usually over a bony prominence.  Wound Description (Comments): pink  Present on Admission:     DVT prophylaxis: SCDs Start: 04/30/20 2047, heparin- change to xarelto  Code Status: Full code  Family Communication: Spoke with the patient's daughter at bedside.  Status is: Inpatient  Remains inpatient appropriate because:Unsafe d/c plan, IV treatments appropriate due to intensity of illness or inability to take PO and Inpatient level of care appropriate due to severity of illness   Dispo: The patient is from: Home              Anticipated d/c is to: SNF              Anticipated d/c date is: 2 days              Patient currently is not medically stable to d/c.  Consultants: Neurology Palliative care  Procedures:  None  Antibiotics:  . None so far  Anti-infectives (From admission, onward)   None     Subjective: Today, patient was seen and examined at bedside.  Patient has some cough  and wheezing especially on eating.  Patient's daughter at bedside.  Objective: Vitals:   05/04/20 0600 05/04/20 0821  BP: 110/69   Pulse: (!) 108 (!) 113  Resp: 16   Temp:    SpO2: 98%     Intake/Output Summary (Last 24 hours) at 05/04/2020 0828 Last data filed at 05/04/2020 0400 Gross per 24 hour  Intake 1066.45 ml  Output --  Net 1066.45 ml   Filed Weights   05/02/20 0008 05/03/20 0342 05/04/20 0331  Weight: 72.6 kg 73.5 kg 73.9 kg   Body mass index is 24.78 kg/m.   Physical Exam: GENERAL: Patient is awake and communicative, slow to respond, coughing and audible wheeze at times.  Appears deconditioned and weak. HENT: No scleral pallor or icterus. Pupils equally reactive to light. Oral mucosa is moist NECK: is supple, no gross swelling noted. CHEST:.  Diminished breath sounds bilaterally.  Coarse breath sounds noted. CVS: S1 and S2 heard, no murmur.  Irregularly irregular rhythm, mild tachycardia ABDOMEN: Soft, non-tender, bowel sounds are present. EXTREMITIES: No edema. CNS: Cranial nerves are intact. No focal motor deficits. SKIN: warm and dry without rashes.  Data Review: I have personally reviewed the following laboratory data and studies,  CBC: Recent Labs  Lab 04/29/20 1218 04/30/20 1637 05/01/20 0059 05/02/20 0432 05/03/20 0402  WBC 6.6 10.2 8.1 8.4 9.9  NEUTROABS  --  7.5 6.0  --   --   HGB 8.5 Repeated and verified X2.* 8.1* 7.7* 8.1* 8.4*  HCT 26.7 Repeated and verified X2.* 27.9* 24.9* 26.4* 27.3*  MCV 75.0* 79.3* 75.9* 76.5* 77.6*  PLT 224.0 229 213 193 973   Basic Metabolic Panel: Recent Labs  Lab 04/29/20 1218 04/30/20 1637 05/01/20 0059 05/02/20 0432 05/03/20 0402  NA 136 138 138 133* 138  K 4.7 3.4* 3.2* 3.3* 3.5  CL 101 104 106 102 106  CO2 25 22 22 22  20*  GLUCOSE 245* 98 126* 96 115*  BUN 20 25* 21 13 15   CREATININE 1.07 1.32* 1.00 0.80 0.85  CALCIUM 8.2* 8.3* 7.7* 7.2* 7.2*  MG  --   --  2.0  --   --    Liver Function  Tests: Recent Labs  Lab 04/29/20 1218 05/01/20 0059 05/02/20 0432 05/03/20 0402  AST 21 53* 51* 53*  ALT 7 18 19 20   ALKPHOS 183* 159* 185* 192*  BILITOT 0.5 0.7 0.7 1.0  PROT 6.5 6.6 6.4* 6.6  ALBUMIN 2.9* 2.0* 2.0* 1.9*   No results for input(s): LIPASE, AMYLASE in the last 168 hours. No results for input(s): AMMONIA in the last 168 hours. Cardiac Enzymes: Recent Labs  Lab 04/30/20 1637 05/01/20 0059  CKTOTAL 981* 717*   BNP (last 3 results) No results for input(s): BNP in the last 8760 hours.  ProBNP (last 3 results) No results for input(s): PROBNP in the last 8760 hours.  CBG: Recent Labs  Lab 05/03/20 1116 05/03/20 1555 05/03/20 2135 05/04/20 0636 05/04/20 0742  GLUCAP 84 110* 230* 132* 140*   Recent Results (from the past 240 hour(s))  SARS Coronavirus 2 by RT PCR (hospital order, performed in [facility] hospital lab) Nasopharyngeal Nasopharyngeal Swab     Status: None   Collection Time: 04/30/20  7:30 PM   Specimen: Nasopharyngeal Swab  Result Value Ref Range Status   SARS Coronavirus 2 NEGATIVE NEGATIVE Final    Comment: (NOTE) SARS-CoV-2 target nucleic acids are NOT DETECTED.  The SARS-CoV-2 RNA is generally detectable in upper and lower respiratory specimens during the acute phase of infection. The lowest concentration of SARS-CoV-2 viral copies this assay can detect is 250 copies / mL. A negative result does not preclude SARS-CoV-2 infection and should not be used as the sole basis for treatment or other patient management decisions.  A negative result may occur with improper specimen collection / handling, submission of specimen other than nasopharyngeal swab, presence of viral mutation(s) within the areas targeted by this assay, and inadequate number of viral copies (<250 copies / mL). A negative result must be combined with clinical observations, patient history, and epidemiological information.  Fact Sheet for Patients:    StrictlyIdeas.no  Fact Sheet for Healthcare Providers: BankingDealers.co.za  This test is not yet approved or  cleared by the [place] FDA and has been authorized for detection and/or diagnosis of SARS-CoV-2 by FDA under an Emergency Use Authorization (EUA).  This EUA will remain in effect (meaning this test can be used) for the duration of the COVID-19 declaration under Section 564(b)(1) of the Act, 21 U.S.C. section 360bbb-3(b)(1), unless the authorization is terminated or revoked sooner.  Performed at [facility], [place] [address] [place], [place] [address]   Urine Culture     Status: Abnormal   Collection Time: 04/30/20  7:30 PM   Specimen: Urine, Random  Result Value Ref Range Status   Specimen Description URINE, RANDOM  Final   Special Requests NONE  Final   Culture (A)  Final    <10,000 COLONIES/mL INSIGNIFICANT GROWTH Performed at [facility], [place] [address], [place], [place] [address]    Report Status 05/01/2020 FINAL  Final     Studies: DG Chest Port 1 View  Result Date: 05/03/2020 CLINICAL DATA:  Respiratory distress EXAM: PORTABLE CHEST 1 VIEW COMPARISON:  04/30/2020 FINDINGS: Shallow lung inflation with left basilar atelectasis. No focal airspace consolidation. No pleural effusion or pneumothorax. IMPRESSION: Shallow lung inflation with left basilar atelectasis. Electronically Signed   By: Ulyses Jarred M.D.   On: 05/03/2020 00:43      Flora Lipps, MD  [facility] 05/04/2020

## 2020-05-04 NOTE — Progress Notes (Signed)
Palliative-  Thank you for this consult.  Chart reviewed.   Called multiple contacts listed. I was able to leave a message for the listed contact Melvenia Beam requesting a return call.   Mariana Kaufman, AGNP-C Palliative Medicine  Please call Palliative Medicine team phone with any questions [phone]. For individual providers please see AMION.  No charge

## 2020-05-04 NOTE — Progress Notes (Signed)
ANTICOAGULATION CONSULT NOTE - Follow Up Consult  Pharmacy Consult for Heparin Indication: atrial fibrillation  Allergies  Allergen Reactions  . Soma [Carisoprodol] Other (See Comments)    "did a number on me"  . Doxazosin Other (See Comments)    dizziness    Patient Measurements: Height: 5\' 8"  (172.7 cm) Weight: 73.9 kg (163 lb) IBW/kg (Calculated) : 68.4 Heparin Dosing Weight:  72.6kg  Vital Signs: Temp: 97.9 F (36.6 C) (08/18 1142) Temp Source: Oral (08/18 1142) BP: 107/67 (08/18 1142) Pulse Rate: 120 (08/18 1142)  Labs: Recent Labs    05/02/20 0432 05/02/20 0432 05/02/20 2007 05/03/20 0402 05/04/20 1224  HGB 8.1*   < >  --  8.4* 7.5*  HCT 26.4*  --   --  27.3* 24.6*  PLT 193  --   --  211 184  APTT  --   --  88*  --   --   HEPARINUNFRC  --   --  0.32 0.33 0.30  CREATININE 0.80  --   --  0.85 0.86   < > = values in this interval not displayed.    Estimated Creatinine Clearance: 63 mL/min (by C-G formula based on SCr of 0.86 mg/dL).   Assessment: 86 yo M with afib on xarelto pta (LD 8/13), held for neuro w/u and dysphagia. FOBT neg.   On 05/04/20 the heparin level is 0.30, therapeutic on heparin drip rate 1150 units/hr.  Hgb down to 7.5, has been in 7s-8s range and  pltc within normal/stable. No reported bleeding. 8/18 SLP evaluation/ recommendations noted: Diet: dysphagia 1 puree, thin liq.  Med administration: crushed with puree.   MD noted on 8/17 neurology has okay'd restart of oral anticoagulant. Will f/u after Dr. Louanne Belton sees patient today.   Goal of Therapy:  Heparin level 0.3-0.7 units/ml Monitor platelets by anticoagulation protocol: Yes   Plan:  Continue heparin to 1150 units/hr, Monitor daily HL, CBC/plt F/u with MD for restart of Xarelto.      Nicole Cella, RPh Clinical Pharmacist Please check AMION for all [place] phone numbers After 10:00 PM, call [place] [phone]

## 2020-05-04 NOTE — TOC Benefit Eligibility Note (Signed)
Transition of Care [facility]) Benefit Eligibility Note    Patient Details  Name: Adrian Singh MRN: [id number] Date of Birth: [birth year]   Medication/Dose: Eliquis 2.5mg  and 5mg .  Covered?: Yes  Tier: 3 Drug  Prescription Coverage Preferred Pharmacy: [facility] with Person/Company/Phone Number:: Roxanne F. W/[employer]/ PH#[phone]  Co-Pay: $23.70     Deductible:  (no deductible)       Shelda Altes Phone Number: 05/04/2020, 4:43 PM

## 2020-05-04 NOTE — Progress Notes (Signed)
  Speech Language Pathology Treatment:    Patient Details Name: Adrian Singh MRN: [id number] DOB: 28-May-1934 Today's Date: 05/04/2020 Time: 1210-1223 SLP Time Calculation (min) (ACUTE ONLY): 13 min  Assessment / Plan / Recommendation Clinical Impression  Pt seen with daughter at bedside. SLP reviewed results of MBS, recommendations for diet texture to facilitate esophageal clearance. Strategies to reduce bacterial load given unavoidable trace aspiration events. Suggested water primarily with frequent oral care. Demonstrated appropriate throat clearing strategy with rationale. Pt may benefit from f/u with GI given esophageal dysphagia. Pt does not need to stay on pureed solids if he is able to tolerate soft foods given his gum pain and esophageal stasis. Written information given to daughter. Will f/u to reinforce throat clear strategy with meal.   HPI HPI: Pt is an 86 yo male presenting s/p fall with L sided weakness and slurred speech. CXR 8/15 without active disease. MRI showed acute infarct involving the R frontal centrum semi ovale as well as a hyperintense lesion involving the posterior R frontal white matter and R corpus callosum favored to reflect subacute ischemic infarct. PMH includes: HTN, DM, stroke, prostate ca, skin ca, GER, esophageal dilation, CAD, afib. Rapid Response on 8/17 secondary to O2 desauturation to the 80s. Pt was suctioned and placed on NRB. CXR 8/17: Shallow lung inflation with left basilar atelectasis      SLP Plan  Continue with current plan of care       Recommendations  Diet recommendations: Dysphagia 1 (puree);Thin liquid Liquids provided via: Straw;Cup;Teaspoon Medication Administration: Crushed with puree Supervision: Full supervision/cueing for compensatory strategies;Staff to assist with self feeding Compensations: Clear throat after each swallow;Multiple dry swallows after each bite/sip Postural Changes and/or Swallow Maneuvers: Seated upright 90  degrees;Upright 30-60 min after meal                Follow up Recommendations: 24 hour supervision/assistance SLP Visit Diagnosis: Dysphagia, oropharyngeal phase (R13.12) Plan: Continue with current plan of care       Gonzales Magdaline Zollars, MA [place] Pager [phone] Office [phone]  Lynann Beaver 05/04/2020, 1:17 PM

## 2020-05-05 ENCOUNTER — Inpatient Hospital Stay (HOSPITAL_COMMUNITY): Payer: Medicare Other

## 2020-05-05 LAB — COMPREHENSIVE METABOLIC PANEL
ALT: 18 U/L (ref 0–44)
AST: 49 U/L — ABNORMAL HIGH (ref 15–41)
Albumin: 1.7 g/dL — ABNORMAL LOW (ref 3.5–5.0)
Alkaline Phosphatase: 236 U/L — ABNORMAL HIGH (ref 38–126)
Anion gap: 9 (ref 5–15)
BUN: 20 mg/dL (ref 8–23)
CO2: 21 mmol/L — ABNORMAL LOW (ref 22–32)
Calcium: 7 mg/dL — ABNORMAL LOW (ref 8.9–10.3)
Chloride: 111 mmol/L (ref 98–111)
Creatinine, Ser: 0.9 mg/dL (ref 0.61–1.24)
GFR calc Af Amer: >60 mL/min (ref >60)
GFR calc non Af Amer: >60 mL/min (ref >60)
Glucose, Bld: 128 mg/dL — ABNORMAL HIGH (ref 70–99)
Potassium: 3.3 mmol/L — ABNORMAL LOW (ref 3.5–5.1)
Sodium: 141 mmol/L (ref 135–145)
Total Bilirubin: 0.6 mg/dL (ref 0.3–1.2)
Total Protein: 6 g/dL — ABNORMAL LOW (ref 6.5–8.1)

## 2020-05-05 LAB — CBC
HCT: 24.4 % — ABNORMAL LOW (ref 39.0–52.0)
Hemoglobin: 7.3 g/dL — ABNORMAL LOW (ref 13.0–17.0)
MCH: 23 pg — ABNORMAL LOW (ref 26.0–34.0)
MCHC: 29.9 g/dL — ABNORMAL LOW (ref 30.0–36.0)
MCV: 76.7 fL — ABNORMAL LOW (ref 80.0–100.0)
Platelets: 172 10*3/uL (ref 150–400)
RBC: 3.18 MIL/uL — ABNORMAL LOW (ref 4.22–5.81)
RDW: 18.2 % — ABNORMAL HIGH (ref 11.5–15.5)
WBC: 6.2 10*3/uL (ref 4.0–10.5)
nRBC: 0 % (ref 0.0–0.2)

## 2020-05-05 LAB — GLUCOSE, CAPILLARY
Glucose-Capillary: 127 mg/dL — ABNORMAL HIGH (ref 70–99)
Glucose-Capillary: 144 mg/dL — ABNORMAL HIGH (ref 70–99)
Glucose-Capillary: 154 mg/dL — ABNORMAL HIGH (ref 70–99)
Glucose-Capillary: 145 mg/dL — ABNORMAL HIGH (ref 70–99)

## 2020-05-05 LAB — PHOSPHORUS: Phosphorus: 1.3 mg/dL — ABNORMAL LOW (ref 2.5–4.6)

## 2020-05-05 LAB — MAGNESIUM: Magnesium: 1.9 mg/dL (ref 1.7–2.4)

## 2020-05-05 MED ORDER — POTASSIUM CHLORIDE 10 MEQ/100ML IV SOLN
10.0000 meq | INTRAVENOUS | Status: AC
  Administered 2020-05-05 (×4): 10 meq via INTRAVENOUS
  Filled 2020-05-05 (×4): qty 100

## 2020-05-05 MED ORDER — POTASSIUM PHOSPHATES 15 MMOLE/5ML IV SOLN
20.0000 mmol | Freq: Once | INTRAVENOUS | Status: AC
  Administered 2020-05-05: 20 mmol via INTRAVENOUS
  Filled 2020-05-05: qty 6.67

## 2020-05-05 MED ORDER — FUROSEMIDE 10 MG/ML IJ SOLN
20.0000 mg | Freq: Once | INTRAMUSCULAR | Status: AC
  Administered 2020-05-05: 20 mg via INTRAVENOUS
  Filled 2020-05-05: qty 2

## 2020-05-05 MED ORDER — ALBUTEROL SULFATE (2.5 MG/3ML) 0.083% IN NEBU
2.5000 mg | INHALATION_SOLUTION | Freq: Four times a day (QID) | RESPIRATORY_TRACT | Status: DC | PRN
  Administered 2020-05-05 – 2020-05-07 (×2): 2.5 mg via RESPIRATORY_TRACT
  Filled 2020-05-05 (×2): qty 3

## 2020-05-05 NOTE — Plan of Care (Signed)
  Problem: Education: Goal: Knowledge of General Education information will improve Description Including pain rating scale, medication(s)/side effects and non-pharmacologic comfort measures Outcome: Progressing   

## 2020-05-05 NOTE — Progress Notes (Addendum)
Palliative  Again attempted to reach family for [place] consult-  No answer at number for [facility].  Number for Shelda Jakes is wrong number.  Left message for Niguel Moure requesting return call from him or Shelda Jakes.   Mariana Kaufman, AGNP-C Palliative Medicine  Please call Palliative Medicine team phone with any questions [phone]. For individual providers please see AMION.  No charge

## 2020-05-05 NOTE — Consult Note (Signed)
   [facility] CM Inpatient Consult   05/05/2020  [name] [birth year] [id number]   [facility] [ACO] Patient: Adrian Singh [facility]   Patient screened for high risk score for unplanned readmission risk and to check if potential [place] Management service needs.  Review of patient's medical record reveals patient is being recommended for a skilled nursing facility stay.  Primary Care Provider is [name], MD this provider is listed to provide the transition of care [TOC] for post hospital follow up.   Plan:  Follow up with inpatient [facility] team. Continue to follow progress and disposition to assess for post hospital care management needs.    Please place a [facility] Care Management consult as appropriate and for questions contact:   Natividad Brood, RN BSN [facility] Liaison  [phone] business mobile phone Toll free office [phone]  Fax number: [phone] [email or web address]

## 2020-05-05 NOTE — Progress Notes (Signed)
Subjective: No significant changes  Exam: Vitals:   05/05/20 0200 05/05/20 0410  BP: 112/70 133/70  Pulse: (!) 113 89  Resp: 14 16  Temp:  98.4 F (36.9 C)  SpO2: 97% 97%   Gen: In bed, NAD Resp: non-labored breathing, no acute distress Abd: soft, nt  Neuro: MS: Awake, alert, interactive and appropriate CN: Left facial droop  Impression: 86 yo M with mass-like lesion on MRI. The appearance on CT does not appear typical of acute stroke to me, and a new demyelinating disease at 6 seems unlikely. The appearance is unusual, but prostate cancer brain mets can have a variable appearance on MRI.  Neurosurgery favors waiting and reimaging.  If this does represent stroke, I would expect slow gradual improvement, and therefore more aggressive supportive therapy may be indicated unless he has a poor prognosis from his other metastatic disease.  If this represents CNS metastasis, then I would expect gradual worsening as opposed to improvement and therefore the decision about a feeding tube would be more difficult, though it could buy him some time. Also, given the timeline of his decline and appearance on MRI, it should be safe to resume anticoagulation.   If stroke, then this is likely embolic due to afib, though R carotid has some mild stenosis, I do not think I would aggressively pursue this at this time. Echo with some hypokinesis, but no obvious embolic source. He is anticoagulated anyway. No need for TEE.  Recommendations: 1) PT, OT, ST 2) outpatient follow-up in 1 to 2 months with repeat imaging.  I have put in a request for a referral to [facility] neurology.  Roland Rack, MD [facility] [phone]  If 7pm- 7am, please page neurology on call as listed in [place].

## 2020-05-05 NOTE — Social Work (Signed)
SW continuing to follow for possible SNF placement. Palliative consult pending. SW will follow and assist as indicated.   Wandra Feinstein, MSW, LCSW [phone] (coverage)

## 2020-05-05 NOTE — Progress Notes (Signed)
  Speech Language Pathology Treatment: Dysphagia  Patient Details Name: DIXIE JAFRI MRN: [id number] DOB: [birth year] Today's Date: 05/05/2020 Time: 9166-0600 SLP Time Calculation (min) (ACUTE ONLY): 14 min  Assessment / Plan / Recommendation Clinical Impression  Pt was seen for dysphagia treatment. He was lethargic but cooperative during the session and his daughter was at bedside. The results of the modified barium swallow study were reviewed as well as swallowing precautions. Pt was able to recall the need for intermittent throat clearing and pt's daughter reported that she intermittently cues pt for its use. Pt exhibited coughing with 1/5 boluses of thin liquids via cup and required cueing for use of throat clearing once. Pt requested that additional boluses be deferred since he was tired and wanted to rest. SLP will continue to follow pt.    HPI HPI: Pt is an 86 yo male presenting s/p fall with L sided weakness and slurred speech. CXR 8/15 without active disease. MRI showed acute infarct involving the R frontal centrum semi ovale as well as a hyperintense lesion involving the posterior R frontal white matter and R corpus callosum favored to reflect subacute ischemic infarct. PMH includes: HTN, DM, stroke, prostate ca, skin ca, GER, esophageal dilation, CAD, afib. Rapid Response on 8/17 secondary to O2 desauturation to the 80s. Pt was suctioned and placed on NRB. CXR 8/17: Shallow lung inflation with left basilar atelectasis      SLP Plan  Continue with current plan of care       Recommendations  Diet recommendations: Dysphagia 1 (puree);Thin liquid Liquids provided via: Straw;Cup;Teaspoon Medication Administration: Crushed with puree Supervision: Full supervision/cueing for compensatory strategies;Staff to assist with self feeding Compensations: Minimize environmental distractions;Slow rate;Small sips/bites;Follow solids with liquid;Clear throat intermittently Postural Changes and/or  Swallow Maneuvers: Seated upright 90 degrees;Upright 30-60 min after meal                Oral Care Recommendations: Oral care BID Follow up Recommendations: Other (comment) (TBA) SLP Visit Diagnosis: Dysphagia, oropharyngeal phase (R13.12) Plan: Continue with current plan of care       Hrithik Boschee I. Hardin Negus, [place], [place] Office number [phone] Pager [place] 05/05/2020, 10:31 AM

## 2020-05-05 NOTE — Progress Notes (Addendum)
PROGRESS NOTE  Adrian Singh URK:[id number] DOB: July 11, 1934 DOA: 04/30/2020 PCP: [name], MD   LOS: 3 days   Brief narrative: As per HPI,  86 year old male with past medical history of paroxysmal atrial fibrillation(on Xarelto),coronary disease(s/p PCI with stent to circumflex),diastolic congestive heart failure(last echo 02/2016 EF 45-50%),diabetes mellitus type 2, hypertension, gastroesophageal reflux disease, prostate cancer, hyperlipidemia presented to [facility] emergency department status post fall.  Patient has been experiencing progressive weakness over the last 2 months.  He also had dyspnea on exertion and chest discomfort with poor appetite and weight loss.  Had completed Covid vaccination as outpatient.  Due to progressive weakness, patient had a fall without loss of consciousness. Upon evaluation in the emergency department, patient complained of moderate to severe musculoskeletal pain of the left chest wall, bilateral hips and legs. X-rays of all of these areas were negative for fracture. 500 cc bolus of IV fluid was given and the patient was then admitted to hospital for further evaluation and treatment.  Assessment/Plan:  Principal Problem:   Fall at home, initial encounter Active Problems:   Essential hypertension   Uncontrolled type 2 diabetes mellitus with diabetic polyneuropathy, without long-term current use of insulin (HCC)   PAF (paroxysmal atrial fibrillation) (HCC)   Atherosclerosis of native coronary artery of native heart with angina pectoris ([place])   Primary malignant neoplasm of prostate metastatic to bone [facility])   Generalized weakness   Acute on chronic anemia   Elevated CK   Chronic kidney disease, stage 3a   Elevated troponin level not due myocardial infarction   Stroke ([place])   Pressure injury of skin   Fall at home, initial encounter, likely secondary to acute CVA Initial CT scan of the head showed generalized cerebral  atrophy.  MRI was done which showed 5 mm right frontal  centrum semi ovale infarct as well as 1.5-2cm lesion involving R corpus callosum,infarct.  Patient does have history of atrial fibrillation on Xarelto at baseline.  Neurology was consulted who advised a neurosurgical consultation for possible metastatic foci.  Neurosurgery did not feel that way and recommended MRI later on.  Physical therapy  recommended skilled nursing facility placement.   palliative care consulted for goals of care. On  dysphagia 1 diet.  Started Xarelto since 05/04/2020.  Was initially on heparin drip.  Neurology on board as well.  Patient will need repeat imaging in 1 to 2 months.  Generalized weakness Physical therapy can recommended skilled nursing facility placement on discharge.  Hypokalemia.  Potassium 3.3 today. Will replenish with IV.  Check levels in a.m.  Essential hypertension Continue metoprolol.  ACE inhibitor is on hold for permissive hypertension.  Type 2 diabetes mellitus with diabetic polyneuropathy,  On glimepiride and Metformin at home.  Continue on sliding scale insulin, Accu-Cheks, diabetic  diet while in hospital.  Currently on dysphagia 1 diet.  paroxysmal atrial fibrillation with Mild RVR. On metoprolol, xarelto.  Will closely monitor.  Replenish lites.  Elevated troponin, likely demand ischemia.  Chest pain due to recent fall.  Acute on chronic anemia, microcytic With low iron noted in iron profile.  Stool occult was negative.  Patient did have colonoscopy 2011 with benign polyp and is due for repeat screening 2021.  No active bleeding.  Xarelto has been started.  Closely monitor hemoglobin.  Hemoglobin of 7.3 today.  Chronic kidney disease, stage 3a Creatinine of 0.8.  Will closely monitor.  Likely at baseline.  Check BMP in a.m.  Metastatic prostate  cancer to the bone.  Concern for acute CVA vs neoplastic process in setting of metastatic prostate cancer.  Palliative care  has been consulted to assist with goals of care.  Severe hypoalbuminemia and failure to thrive.  Acute hypoxemic respiratory failure Improved possibly secondary to aspiration.Currently on dysphagia 1 diet.    No indication for antibiotic at this time.  Temperature max of 98.79F.  Currently on room air.  Incentive spirometry, aspiration precautions.  Stage I pressure ulcer to the coccyx -present on admission, continue preventive protocol.  Pressure Injury 05/03/20 Coccyx Stage 1 -  Intact skin with non-blanchable redness of a localized area usually over a bony prominence. pink (Active)  05/03/20 0908  Location: Coccyx  Location Orientation:   Staging: Stage 1 -  Intact skin with non-blanchable redness of a localized area usually over a bony prominence.  Wound Description (Comments): pink  Present on Admission:     DVT prophylaxis: SCDs Start: 04/30/20 2047,  rivaroxaban (XARELTO) tablet 20 mg    Addendum:  05/05/2020 6:04 PM  Nursing staff reported congestion secretions.  Will discontinue IV fluids.  Give 1 dose of IV Lasix.  Patient is positive balance.  Will also consider nasotracheal suctioning.  Chest x-ray will be ordered.  Hypophosphatemia.  Will replenish with potassium phosphate.  Check levels in a.m.  Code Status: Full code  Family Communication: Spoke with the patient's daughter at bedside.  Status is: Inpatient  Remains inpatient appropriate because:Unsafe d/c plan, IV treatments appropriate due to intensity of illness or inability to take PO and Inpatient level of care appropriate due to severity of illness  Dispo: The patient is from: Home              Anticipated d/c is to: SNF              Anticipated d/c date is: 2 days               Patient currently is not medically stable to d/c.  Consultants: Neurology Palliative care  Procedures:  None  Antibiotics:  . None so far  Anti-infectives (From admission, onward)   None     Subjective: Today, patient  was seen and examined at bedside.  Complains of generalized weakness.  Blood pressure is more alert awake and communicative.  Objective: Vitals:   05/05/20 0200 05/05/20 0410  BP: 112/70 133/70  Pulse: (!) 113 89  Resp: 14 16  Temp:  98.4 F (36.9 C)  SpO2: 97% 97%    Intake/Output Summary (Last 24 hours) at 05/05/2020 1046 Last data filed at 05/05/2020 5427 Gross per 24 hour  Intake 2003.65 ml  Output 150 ml  Net 1853.65 ml   Filed Weights   05/03/20 0342 05/04/20 0331 05/05/20 0410  Weight: 73.5 kg 73.9 kg 77.1 kg   Body mass index is 25.85 kg/m.   Physical Exam: GENERAL: Patient is awake and communicative, slow to respond, Appears deconditioned and weak.  More alert awake. HENT: No scleral pallor or icterus. Pupils equally reactive to light. Oral mucosa is moist NECK: is supple, no gross swelling noted. CHEST:.  Diminished breath sounds bilaterally.  Coarse breath sounds noted. CVS: S1 and S2 heard, no murmur.  Irregularly irregular rhythm, mild tachycardia ABDOMEN: Soft, non-tender, bowel sounds are present. EXTREMITIES: No edema. CNS: Cranial nerves are intact. No focal motor deficits. SKIN: warm and dry without rashes.  Data Review: I have personally reviewed the following laboratory data and studies,  CBC: Recent Labs  Lab  04/30/20 1637 04/30/20 1637 05/01/20 0059 05/02/20 0432 05/03/20 0402 05/04/20 1224 05/05/20 0447  WBC 10.2   < > 8.1 8.4 9.9 6.6 6.2  NEUTROABS 7.5  --  6.0  --   --   --   --   HGB 8.1*   < > 7.7* 8.1* 8.4* 7.5* 7.3*  HCT 27.9*   < > 24.9* 26.4* 27.3* 24.6* 24.4*  MCV 79.3*   < > 75.9* 76.5* 77.6* 76.4* 76.7*  PLT 229   < > 213 193 211 184 172   < > = values in this interval not displayed.   Basic Metabolic Panel: Recent Labs  Lab 05/01/20 0059 05/02/20 0432 05/03/20 0402 05/04/20 1224 05/05/20 0447  NA 138 133* 138 139 141  K 3.2* 3.3* 3.5 3.1* 3.3*  CL 106 102 106 108 111  CO2 22 22 20* 22 21*  GLUCOSE 126* 96 115* 252*  128*  BUN 21 13 15 22 20   CREATININE 1.00 0.80 0.85 0.86 0.90  CALCIUM 7.7* 7.2* 7.2* 6.9* 7.0*  MG 2.0  --   --   --  1.9  PHOS  --   --   --   --  1.3*   Liver Function Tests: Recent Labs  Lab 05/01/20 0059 05/02/20 0432 05/03/20 0402 05/04/20 1224 05/05/20 0447  AST 53* 51* 53* 62* 49*  ALT 18 19 20 18 18   ALKPHOS 159* 185* 192* 250* 236*  BILITOT 0.7 0.7 1.0 0.6 0.6  PROT 6.6 6.4* 6.6 6.1* 6.0*  ALBUMIN 2.0* 2.0* 1.9* 1.7* 1.7*   No results for input(s): LIPASE, AMYLASE in the last 168 hours. No results for input(s): AMMONIA in the last 168 hours. Cardiac Enzymes: Recent Labs  Lab 04/30/20 1637 05/01/20 0059  CKTOTAL 981* 717*   BNP (last 3 results) No results for input(s): BNP in the last 8760 hours.  ProBNP (last 3 results) No results for input(s): PROBNP in the last 8760 hours.  CBG: Recent Labs  Lab 05/04/20 0742 05/04/20 1139 05/04/20 1636 05/04/20 2121 05/05/20 0622  GLUCAP 140* 227* 156* 166* 127*   Recent Results (from the past 240 hour(s))  SARS Coronavirus 2 by RT PCR (hospital order, performed in [facility] hospital lab) Nasopharyngeal Nasopharyngeal Swab     Status: None   Collection Time: 04/30/20  7:30 PM   Specimen: Nasopharyngeal Swab  Result Value Ref Range Status   SARS Coronavirus 2 NEGATIVE NEGATIVE Final    Comment: (NOTE) SARS-CoV-2 target nucleic acids are NOT DETECTED.  The SARS-CoV-2 RNA is generally detectable in upper and lower respiratory specimens during the acute phase of infection. The lowest concentration of SARS-CoV-2 viral copies this assay can detect is 250 copies / mL. A negative result does not preclude SARS-CoV-2 infection and should not be used as the sole basis for treatment or other patient management decisions.  A negative result may occur with improper specimen collection / handling, submission of specimen other than nasopharyngeal swab, presence of viral mutation(s) within the areas targeted by this  assay, and inadequate number of viral copies (<250 copies / mL). A negative result must be combined with clinical observations, patient history, and epidemiological information.  Fact Sheet for Patients:   StrictlyIdeas.no  Fact Sheet for Healthcare Providers: BankingDealers.co.za  This test is not yet approved or  cleared by the [place] FDA and has been authorized for detection and/or diagnosis of SARS-CoV-2 by FDA under an Emergency Use Authorization (EUA).  This EUA will remain in  effect (meaning this test can be used) for the duration of the COVID-19 declaration under Section 564(b)(1) of the Act, 21 U.S.C. section 360bbb-3(b)(1), unless the authorization is terminated or revoked sooner.  Performed at [facility], [place] [address], [place], [place] [address]   Urine Culture     Status: Abnormal   Collection Time: 04/30/20  7:30 PM   Specimen: Urine, Random  Result Value Ref Range Status   Specimen Description URINE, RANDOM  Final   Special Requests NONE  Final   Culture (A)  Final    <10,000 COLONIES/mL INSIGNIFICANT GROWTH Performed at [facility], [place] [address] [place], [place] [address]    Report Status 05/01/2020 FINAL  Final     Studies: DG Swallowing Func-Speech Pathology  Result Date: 05/04/2020 Objective Swallowing Evaluation: Type of Study: MBS-Modified Barium Swallow Study  Patient Details Name: Adrian Singh MRN: [id number] Date of Birth: 15-Dec-1933 Today's Date: 05/04/2020 Time: SLP Start Time (ACUTE ONLY): 0915 -SLP Stop Time (ACUTE ONLY): 0925 SLP Time Calculation (min) (ACUTE ONLY): 10 min Past Medical History: Past Medical History: Diagnosis Date . Arthritis   "legs, back" (10/06/2015) . Atrial fibrillation ([place])   x1 . CAD (coronary artery disease)   nonobstructive . Cancer ([place])   skin cancer on ear (froze it off) and back (cut it off) . Dysrhythmia  . Esophageal reflux   hx of . History of  kidney stones  . Hx of heart artery stent  . Hypertrophy of prostate without urinary obstruction and other lower urinary tract symptoms (LUTS)  . Osteoarthrosis, unspecified whether generalized or localized, unspecified site  . Prostate cancer ([place])  . Skin cancer  . Stroke [facility]) 03/2014  "had a series of mini strokes; maybe 4"; denies residual on 10/06/2015 . Thrombocytopenia ([place])  . Type II diabetes mellitus ([place])   type 2 . Unspecified essential hypertension  Past Surgical History: Past Surgical History: Procedure Laterality Date . BACK SURGERY    2001 . CARDIAC CATHETERIZATION  12/2007 . CARDIAC CATHETERIZATION N/A 09/22/2015  Procedure: Left Heart Cath and Coronary Angiography;  Surgeon: Peter M Martinique, MD;  Location: [place] [facility];  Service: Cardiovascular;  Laterality: N/A; . CARDIAC CATHETERIZATION N/A 10/06/2015  Procedure: Coronary Stent Intervention;  Surgeon: Peter M Martinique, MD;  Location: [place] [facility];  Service: Cardiovascular;  Laterality: N/A; . CARDIAC CATHETERIZATION N/A 02/29/2016  Procedure: Left Heart Cath and Coronary Angiography;  Surgeon: Jettie Booze, MD;  Location: [place] [facility];  Service: Cardiovascular;  Laterality: N/A; . COLONOSCOPY W/ BIOPSIES AND POLYPECTOMY   . CORONARY STENT PLACEMENT  10/06/2015  [name]  with DES . EAR CYST EXCISION N/A 04/06/2015  Procedure: EXCISION OF SCALP CYST;  Surgeon: Donnie Mesa, MD;  Location: [place];  Service: General;  Laterality: N/A; . ESOPHAGOGASTRODUODENOSCOPY (EGD) WITH ESOPHAGEAL DILATION  2001  with dilation . LUMBAR DISC SURGERY  09/26/1999  "cleaned out arthritis and bone spurs" . SHOULDER ARTHROSCOPY W/ ROTATOR CUFF REPAIR Left 2005 . THULIUM LASER TURP (TRANSURETHRAL RESECTION OF PROSTATE) N/A 01/21/2017  Procedure: Marcelino Duster LASER TURP (TRANSURETHRAL RESECTION OF PROSTATE) CAUTERIZATION OF BLADDER LESION;  Surgeon: Franchot Gallo, MD;  Location: [facility];  Service: Urology;  Laterality: N/A; HPI: Pt is an 86 yo male presenting s/p  fall with L sided weakness and slurred speech. CXR 8/15 without active disease. MRI showed acute infarct involving the R frontal centrum semi ovale as well as a hyperintense lesion involving the posterior R frontal white  matter and R corpus callosum favored to reflect subacute ischemic infarct. PMH includes: HTN, DM, stroke, prostate ca, skin ca, GER, esophageal dilation, CAD, afib. Rapid Response on 8/17 secondary to O2 desauturation to the 80s. Pt was suctioned and placed on NRB. CXR 8/17: Shallow lung inflation with left basilar atelectasis  Subjective: alert, speech is mumbled Assessment / Plan / Recommendation CHL IP CLINICAL IMPRESSIONS 05/04/2020 Clinical Impression Pt demonstrates a severe oropharyngeal dysphagia, likely secondary to a primary esophageal impairment. Pt has relatively good oral phase, but there are instances of slight delay in swallow initiation with frank penetration prior to full laryngeal closure. Of greater concern is the mild vallecular and moderate pyriform residue present consistently with all liquids despite adequate appearing laryngeal elevation, hyoid excursion and base of tongue retraction. Esophageal sweep shows concern for a significant motility impairement with severe residue and suspected esophageal spasm (no radiologist present to confirm). Poor esophageal peristalsis can impact appropriate negative pressure in the pharynx, which is suspected in this case. Regardless of liquid texture, positional strategies, or compensatory strategies, pt has  mild penetration after the swallow, leading to eventual silent aspiration. If pt can clear his throat preventatively, he will improve his airway protection. Recommend thin liquids as there is risk across textures. Pt is capable of mech soft orally, but esophageal clearance of solids may be difficult. Recommend pt continue purees for now with thin liquids. Will f/u for tolerance and pt/family education.  SLP Visit Diagnosis Dysphagia,  oropharyngeal phase (R13.12) Attention and concentration deficit following -- Frontal lobe and executive function deficit following -- Impact on safety and function Moderate aspiration risk   CHL IP TREATMENT RECOMMENDATION 05/04/2020 Treatment Recommendations Therapy as outlined in treatment plan below   Prognosis 05/01/2020 Prognosis for Safe Diet Advancement Good Barriers to Reach Goals -- Barriers/Prognosis Comment -- CHL IP DIET RECOMMENDATION 05/04/2020 SLP Diet Recommendations -- Liquid Administration via -- Medication Administration Crushed with puree Compensations Clear throat after each swallow;Multiple dry swallows after each bite/sip Postural Changes --   No flowsheet data found.  CHL IP FOLLOW UP RECOMMENDATIONS 05/04/2020 Follow up Recommendations 24 hour supervision/assistance   CHL IP FREQUENCY AND DURATION 05/04/2020 Speech Therapy Frequency (ACUTE ONLY) min 2x/week Treatment Duration 2 weeks      CHL IP ORAL PHASE 05/04/2020 Oral Phase Impaired Oral - Pudding Teaspoon -- Oral - Pudding Cup -- Oral - Honey Teaspoon -- Oral - Honey Cup WFL Oral - Nectar Teaspoon -- Oral - Nectar Cup WFL Oral - Nectar Straw WFL Oral - Thin Teaspoon -- Oral - Thin Cup WFL Oral - Thin Straw WFL Oral - Puree WFL Oral - Mech Soft Delayed oral transit;Reduced posterior propulsion Oral - Regular -- Oral - Multi-Consistency -- Oral - Pill WFL Oral Phase - Comment --  CHL IP PHARYNGEAL PHASE 05/04/2020 Pharyngeal Phase Impaired Pharyngeal- Pudding Teaspoon -- Pharyngeal -- Pharyngeal- Pudding Cup -- Pharyngeal -- Pharyngeal- Honey Teaspoon -- Pharyngeal -- Pharyngeal- Honey Cup Pharyngeal residue - valleculae;Pharyngeal residue - pyriform;Penetration/Apiration after swallow Pharyngeal Material enters airway, remains ABOVE vocal cords and not ejected out Pharyngeal- Nectar Teaspoon -- Pharyngeal -- Pharyngeal- Nectar Cup Penetration/Apiration after swallow;Penetration/Aspiration during swallow;Pharyngeal residue -  valleculae;Pharyngeal residue - pyriform;Trace aspiration Pharyngeal Material enters airway, remains ABOVE vocal cords and not ejected out;Material enters airway, CONTACTS cords and not ejected out;Material enters airway, passes BELOW cords without attempt by patient to eject out (silent aspiration) Pharyngeal- Nectar Straw Penetration/Apiration after swallow;Penetration/Aspiration during swallow;Pharyngeal residue - valleculae;Pharyngeal residue - pyriform;Trace aspiration Pharyngeal Material  enters airway, passes BELOW cords without attempt by patient to eject out (silent aspiration);Material enters airway, CONTACTS cords and not ejected out;Material enters airway, remains ABOVE vocal cords and not ejected out Pharyngeal- Thin Teaspoon -- Pharyngeal -- Pharyngeal- Thin Cup Penetration/Apiration after swallow;Penetration/Aspiration during swallow;Pharyngeal residue - valleculae;Pharyngeal residue - pyriform;Trace aspiration Pharyngeal Material enters airway, passes BELOW cords without attempt by patient to eject out (silent aspiration);Material enters airway, CONTACTS cords and not ejected out;Material enters airway, remains ABOVE vocal cords then ejected out;Material enters airway, remains ABOVE vocal cords and not ejected out Pharyngeal- Thin Straw -- Pharyngeal -- Pharyngeal- Puree Pharyngeal residue - valleculae;Pharyngeal residue - pyriform Pharyngeal -- Pharyngeal- Mechanical Soft Pharyngeal residue - valleculae;Pharyngeal residue - pyriform Pharyngeal -- Pharyngeal- Regular -- Pharyngeal -- Pharyngeal- Multi-consistency -- Pharyngeal -- Pharyngeal- Pill -- Pharyngeal -- Pharyngeal Comment --  No flowsheet data found. Herbie Baltimore, MA CCC-SLP Acute Rehabilitation Services Pager [phone] Office [phone] Lynann Beaver 05/04/2020, 11:34 AM                 Flora Lipps, MD  [facility] 05/05/2020

## 2020-05-06 DIAGNOSIS — R627 Adult failure to thrive: Secondary | ICD-10-CM

## 2020-05-06 DIAGNOSIS — R4589 Other symptoms and signs involving emotional state: Secondary | ICD-10-CM

## 2020-05-06 DIAGNOSIS — Z515 Encounter for palliative care: Secondary | ICD-10-CM

## 2020-05-06 DIAGNOSIS — Z7189 Other specified counseling: Secondary | ICD-10-CM

## 2020-05-06 DIAGNOSIS — E43 Unspecified severe protein-calorie malnutrition: Secondary | ICD-10-CM | POA: Insufficient documentation

## 2020-05-06 LAB — PHOSPHORUS: Phosphorus: 1.9 mg/dL — ABNORMAL LOW (ref 2.5–4.6)

## 2020-05-06 LAB — CBC
HCT: 23.4 % — ABNORMAL LOW (ref 39.0–52.0)
Hemoglobin: 7.1 g/dL — ABNORMAL LOW (ref 13.0–17.0)
MCH: 23.1 pg — ABNORMAL LOW (ref 26.0–34.0)
MCHC: 30.3 g/dL (ref 30.0–36.0)
MCV: 76 fL — ABNORMAL LOW (ref 80.0–100.0)
Platelets: 168 10*3/uL (ref 150–400)
RBC: 3.08 MIL/uL — ABNORMAL LOW (ref 4.22–5.81)
RDW: 18.2 % — ABNORMAL HIGH (ref 11.5–15.5)
WBC: 5.2 10*3/uL (ref 4.0–10.5)
nRBC: 0 % (ref 0.0–0.2)

## 2020-05-06 LAB — GLUCOSE, CAPILLARY
Glucose-Capillary: 179 mg/dL — ABNORMAL HIGH (ref 70–99)
Glucose-Capillary: 254 mg/dL — ABNORMAL HIGH (ref 70–99)
Glucose-Capillary: 146 mg/dL — ABNORMAL HIGH (ref 70–99)
Glucose-Capillary: 145 mg/dL — ABNORMAL HIGH (ref 70–99)

## 2020-05-06 LAB — BASIC METABOLIC PANEL
Anion gap: 10 (ref 5–15)
BUN: 15 mg/dL (ref 8–23)
CO2: 22 mmol/L (ref 22–32)
Calcium: 6.8 mg/dL — ABNORMAL LOW (ref 8.9–10.3)
Chloride: 109 mmol/L (ref 98–111)
Creatinine, Ser: 0.81 mg/dL (ref 0.61–1.24)
GFR calc Af Amer: >60 mL/min (ref >60)
GFR calc non Af Amer: >60 mL/min (ref >60)
Glucose, Bld: 201 mg/dL — ABNORMAL HIGH (ref 70–99)
Potassium: 3.5 mmol/L (ref 3.5–5.1)
Sodium: 141 mmol/L (ref 135–145)

## 2020-05-06 LAB — MAGNESIUM: Magnesium: 1.7 mg/dL (ref 1.7–2.4)

## 2020-05-06 MED ORDER — ENSURE ENLIVE PO LIQD
237.0000 mL | Freq: Three times a day (TID) | ORAL | Status: DC
  Administered 2020-05-06 – 2020-05-09 (×9): 237 mL via ORAL
  Administered 2020-05-10: 20 mL via ORAL
  Administered 2020-05-11 – 2020-05-12 (×5): 237 mL via ORAL

## 2020-05-06 MED ORDER — POTASSIUM PHOSPHATES 15 MMOLE/5ML IV SOLN
30.0000 mmol | Freq: Once | INTRAVENOUS | Status: AC
  Administered 2020-05-06: 30 mmol via INTRAVENOUS
  Filled 2020-05-06: qty 10

## 2020-05-06 MED ORDER — INSULIN ASPART 100 UNIT/ML ~~LOC~~ SOLN
0.0000 [IU] | Freq: Three times a day (TID) | SUBCUTANEOUS | Status: DC
  Administered 2020-05-06: 8 [IU] via SUBCUTANEOUS
  Administered 2020-05-06 (×2): 2 [IU] via SUBCUTANEOUS
  Administered 2020-05-06: 3 [IU] via SUBCUTANEOUS
  Administered 2020-05-07 (×2): 2 [IU] via SUBCUTANEOUS
  Administered 2020-05-07: 5 [IU] via SUBCUTANEOUS
  Administered 2020-05-07 – 2020-05-08 (×2): 3 [IU] via SUBCUTANEOUS
  Administered 2020-05-08: 2 [IU] via SUBCUTANEOUS
  Administered 2020-05-08 – 2020-05-09 (×2): 5 [IU] via SUBCUTANEOUS
  Administered 2020-05-09: 3 [IU] via SUBCUTANEOUS
  Administered 2020-05-09 (×2): 2 [IU] via SUBCUTANEOUS
  Administered 2020-05-10: 5 [IU] via SUBCUTANEOUS
  Administered 2020-05-10: 3 [IU] via SUBCUTANEOUS
  Administered 2020-05-10: 5 [IU] via SUBCUTANEOUS
  Administered 2020-05-10 – 2020-05-11 (×4): 3 [IU] via SUBCUTANEOUS
  Administered 2020-05-11 – 2020-05-12 (×2): 2 [IU] via SUBCUTANEOUS
  Administered 2020-05-12: 3 [IU] via SUBCUTANEOUS
  Administered 2020-05-12: 2 [IU] via SUBCUTANEOUS

## 2020-05-06 MED ORDER — HYDROCOD POLST-CPM POLST ER 10-8 MG/5ML PO SUER
5.0000 mL | Freq: Two times a day (BID) | ORAL | Status: DC | PRN
  Administered 2020-05-07: 5 mL via ORAL
  Filled 2020-05-06: qty 5

## 2020-05-06 NOTE — Progress Notes (Signed)
Physical Therapy Treatment Patient Details Name: Adrian Singh MRN: [id number] DOB: [birth year] Today's Date: 05/06/2020    History of Present Illness Pt is an 86 yo male presenting s/p fall with L sided weakness and slurred speech. CXR 8/15 without active disease. MRI showed acute infarct involving the R frontal centrum semi ovale as well as a hyperintense lesion involving the posterior R frontal Suhaas Agena matter and R corpus callosum favored to reflect subacute ischemic infarct. PMH includes: HTN, DM, stroke, prostate ca, skin ca, GER, esophageal dilation, CAD, afib     PT Comments    Pt admitted with above diagnosis. Pt progressed ambulation distance although limited due to need for pt to have BM.  Pt needing +2 min to min guard assist for all mobility. Uses RW for safety.  Fair safety awareness overall.  Pt currently with functional limitations due to the deficits listed below (see PT Problem List). Pt will benefit from skilled PT to increase their independence and safety with mobility to allow discharge to the venue listed below.     Follow Up Recommendations  SNF;Supervision/Assistance - 24 hour     Equipment Recommendations  Rolling walker with 5" wheels    Recommendations for Other Services       Precautions / Restrictions Precautions Precautions: Fall Restrictions Weight Bearing Restrictions: No    Mobility  Bed Mobility Overal bed mobility: Needs Assistance Bed Mobility: Supine to Sit     Supine to sit: Min assist;+2 for safety/equipment     General bed mobility comments: minor LE assist and truncal assist up via R elbow, then pt scooted with min guard.  cues for direction.  Transfers Overall transfer level: Needs assistance Equipment used: Rolling walker (2 wheeled) Transfers: Sit to/from Stand Sit to Stand: Min assist;+2 safety/equipment;From elevated surface Stand pivot transfers: Min assist;+2 safety/equipment;From elevated surface       General transfer  comment: more forward assist than boost  Ambulation/Gait Ambulation/Gait assistance: Min assist;Min guard;+2 safety/equipment Gait Distance (Feet): 25 Feet Assistive device: Rolling walker (2 wheeled) Gait Pattern/deviations: Step-through pattern;Decreased stride length;Trunk flexed;Drifts right/left Gait velocity: slow Gait velocity interpretation: <1.31 ft/sec, indicative of household ambulator General Gait Details: short, mildly unsteady steps, but safe with the RW.  Stooped posture, minimal assist during turns and backing up.  Pt ambulated to door of room and stated he needed to have BM therefore ambulated to bathroom.  Pt had BM and OT arrived therefore left pt on toilet and allowed OT to do their treatment session.    Stairs             Wheelchair Mobility    Modified Rankin (Stroke Patients Only)       Balance Overall balance assessment: Needs assistance Sitting-balance support: No upper extremity supported;Feet supported Sitting balance-Leahy Scale: Fair     Standing balance support: Bilateral upper extremity supported;During functional activity Standing balance-Leahy Scale: Fair Standing balance comment: relies on UE ssupport and external support.                             Cognition Arousal/Alertness: Awake/alert Behavior During Therapy: WFL for tasks assessed/performed Overall Cognitive Status: Within Functional Limits for tasks assessed                                 General Comments: initially appears [facility] but as pt fatigued towards end of session requiring multimodal  cues for processing/following instruction       Exercises General Exercises - Lower Extremity Ankle Circles/Pumps: AROM;Both;10 reps;Seated Long Arc Quad: AROM;Both;10 reps;Seated    General Comments General comments (skin integrity, edema, etc.): HR 85-90 bpm, Pt needed 4LO2 to maintain sats >90%.       Pertinent Vitals/Pain Pain Assessment: 0-10 Pain  Score: 1  Pain Location: l hip Pain Descriptors / Indicators: Aching Pain Intervention(s): Monitored during session    Home Living                      Prior Function            PT Goals (current goals can now be found in the care plan section) Acute Rehab PT Goals Patient Stated Goal: to get better Progress towards PT goals: Progressing toward goals    Frequency    Min 2X/week      PT Plan Current plan remains appropriate    Co-evaluation   Reason for Co-Treatment: For patient/therapist safety PT goals addressed during session: Mobility/safety with mobility OT goals addressed during session: ADL's and self-care      AM-PAC PT "6 Clicks" Mobility   Outcome Measure  Help needed turning from your back to your side while in a flat bed without using bedrails?: A Little Help needed moving from lying on your back to sitting on the side of a flat bed without using bedrails?: A Little Help needed moving to and from a bed to a chair (including a wheelchair)?: A Little Help needed standing up from a chair using your arms (e.g., wheelchair or bedside chair)?: A Little Help needed to walk in hospital room?: A Little Help needed climbing 3-5 steps with a railing? : A Lot 6 Click Score: 17    End of Session Equipment Utilized During Treatment: Gait belt;Oxygen Activity Tolerance: Patient tolerated treatment well Patient left: with call bell/phone within reach;Other (comment) (with OT in bathroom) Nurse Communication: Mobility status PT Visit Diagnosis: Unsteadiness on feet (R26.81);Muscle weakness (generalized) (M62.81);Difficulty in walking, not elsewhere classified (R26.2)     Time: 9163-8466 PT Time Calculation (min) (ACUTE ONLY): 27 min  Charges:  $Gait Training: 8-22 mins $Therapeutic Exercise: 8-22 mins                     Dejean Tribby Singh,PT Acute Rehabilitation Services Pager:  [phone]  Office:  [place] 05/06/2020, 2:07 PM

## 2020-05-06 NOTE — Consult Note (Addendum)
Palliative Medicine Inpatient Consult Note  Reason for consult:  Goals of Care "Metastatic prostate cancer, acute stroke vs metastatic brain lesions, new aspiration, failure to thrive"  HPI:  Per intake H&P --> 86 year old male with past medical history of paroxysmal atrial fibrillation (on Xarelto), coronary disease (s/p PCI with stent to circumflex), diastolic congestive heart failure (last echo 02/2016 EF 45-50%), diabetes mellitus type 2, hypertension, gastroesophageal reflux disease, prostate cancer, hyperlipidemia. He present status post fall.   Palliative care was asked to get involved to aid in goals of care conversations.   Clinical Assessment/Goals of Care: I have reviewed medical records including EPIC notes, labs and imaging, received report from bedside RN, assessed the patient.    I met with Adrian Singh and Adrian Singh to further discuss diagnosis prognosis, GOC, EOL wishes, disposition and options.   I introduced Palliative Medicine as specialized medical care for people living with serious illness. It focuses on providing relief from the symptoms and stress of a serious illness. The goal is to improve quality of life for both the patient and the family.  I asked Adrian Singh to tell me about himself. He is from [facility], he has lived her all of his life. He has been married to his wife for 26 four years. They had four children together one of whom is deceased. He expresses great emotion when discussing his sons tragic loss. Utilized reflective listening during this time. 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 He get enjoyment out of spending time with his family. He is a religious man and practices within the Adrian Singh.   Prior to admission, Adrian Singh states that he was able to do all bADL's independently. He states that things were going along well until "I had to go and fall using the toilet." We talked about how his hospitalization has been as  he was noted to be coughing quite a it during our time together. He shares that he is not swallowing well and this has been very difficult on him. I shared with him that dysphagia can be a terminal diagnosis.  I asked him to tell me more about his cancer diagnosis which he was able to do though he was not aware of the concern metastatic disease. We discussed where other lesion had been found inclusive of his C3 spine, posterior right frontal lobe, chest, abdomen, and pelvis.   I shared with Adrian Singh that at this point in time it is worth discussing with his family what is important to him and identifying goals of himself. Adrian Singh shares that he is unsure if he will be able to get back on his feet but if he were able to that would be his hope for himself.  A detailed discussion was had today regarding advanced directives, patient has never completed these though he would be interested in pursuing this while he is here.    Concepts specific to code status, artifical feeding and hydration, continued IV antibiotics and rehospitalization was had.  I strongly recommended consideration of DNR given patients comorbid conditions I worry he would not recover from a true code. His daughter, Adrian Singh states that she will discuss these concepts with the rest of her family.  The difference between a aggressive medical intervention path  and a palliative comfort care path for this patient at this time was had. Patient shares that his grandchildren and great grandchildren are important to him. He starts to reflect on his life during our time  together. Adrian Singh got very emotional about the ongoing care and support of his wife.   Adrian Singh mentioned hospice.  I described hospice as a service for patients for have a life expectancy of < 6 months. It preserves dignity and quality at the end phases of life. The focus changes from curative to symptom relief. I shared with Adrian Singh that he is appropriate for hospice at this point in time.   Discussed the importance of continued conversation with family and their  medical providers regarding overall plan of care and treatment options, ensuring decisions are within the context of the patients values and GOCs.  Provided "Hard Choices for [employer]" booklet.   Decision Maker: Patient can make decisions for himself though if unable to he would rely on his spouse --> Adrian Singh (wife) [phone]  SUMMARY OF RECOMMENDATIONS   Full Code / Full Scope of treatment --> Strongly recommended DNR patient family plan to discuss this further today  Broached the topic of hospice which patient would be appropriate for though he does not appear ready for this at this time  Spiritual --> HCPOA and [place]  Ongoing [place] conversations in the setting of metastatic disease, dysphagia, and FTT  Code Status/Advance Care Planning: FULL CODE   Symptom Management:  Dysphagia: Failure to Thrive:  - Speech involved  - Dietician involved  - Encourage 1:1 feeding  - Supplemental nutrients  Muscular Weakness:                 - Physical Therapy Evaluation                 - Occupational Therapy Evaluation  Need for emotional support:  - Utilize therapeutic listening  - Ensure safe space to express concerns  - Sit next to patient/family making eye contact   Palliative Prophylaxis:   Oral Care, Aspiration Precautions, Mobility  Additional Recommendations (Limitations, Scope, Preferences):  Full scope of treatment for the time being   Psycho-social/Spiritual:   Desire for further Chaplaincy support: Yes - [employer]  Additional Recommendations: Education on hospice   Prognosis: Poor given ongoing dysphagia and metastatic disease  Discharge Planning: Unclear  PPS: 30%   This conversation/these recommendations were discussed with patient primary care team, Dr. Louanne Belton  Time In: 0900 Time Out:1030 Total Time: 90 Greater than 50%  of this time was spent  counseling and coordinating care related to the above assessment and plan.  [facility] Team Team Cell Phone: [phone] Please utilize secure chat with additional questions, if there is no response within 30 minutes please call the above phone number  Palliative Medicine Team providers are available by phone from 7am to 7pm daily and can be reached through the team cell phone.  Should this patient require assistance outside of these hours, please call the patient's attending physician.

## 2020-05-06 NOTE — Progress Notes (Signed)
Occupational Therapy Treatment Patient Details Name: Adrian Singh MRN: [id number] DOB: February 28, 1934 Today's Date: 05/06/2020    History of present illness Pt is an 86 yo male presenting s/p fall with L sided weakness and slurred speech. CXR 8/15 without active disease. MRI showed acute infarct involving the R frontal centrum semi ovale as well as a hyperintense lesion involving the posterior R frontal white matter and R corpus callosum favored to reflect subacute ischemic infarct. PMH includes: HTN, DM, stroke, prostate ca, skin ca, GER, esophageal dilation, CAD, afib    OT comments  Pt. Was seen to increase I and safety with AMB in room and to work on toileting. Pt. Bowels were loose and needed assist to clean up. Pt. Was able to stand at sink to wash hands. Pt. Sat to comb haiir at s level with cues for coverage. Pt.is an agreement to ST SNF. Acute ot to follow.   Follow Up Recommendations  SNF;Supervision/Assistance - 24 hour    Equipment Recommendations       Recommendations for Other Services      Precautions / Restrictions Precautions Precautions: Fall Restrictions Weight Bearing Restrictions: No       Mobility Bed Mobility                  Transfers       Sit to Stand: Min assist;+2 safety/equipment;From elevated surface Stand pivot transfers: Min assist;+2 safety/equipment;From elevated surface            Balance             Standing balance-Leahy Scale: Fair                             ADL either performed or assessed with clinical judgement   ADL Overall ADL's : Needs assistance/impaired Eating/Feeding: Set up Eating/Feeding Details (indicate cue type and reason): pt awaiting speech consult prior to eating  Grooming: Sitting;Minimal assistance;Wash/dry face;Brushing hair Grooming Details (indicate cue type and reason): assist to comb hair seated EOB, minguard assist for sitting balance EOB Upper Body Bathing: Minimal  assistance;Sitting   Lower Body Bathing: Moderate assistance;Sit to/from stand   Upper Body Dressing : Minimal assistance;Sitting   Lower Body Dressing: Maximal assistance;Sit to/from stand   Toilet Transfer: Minimal assistance;+2 for safety/equipment;Ambulation;RW;Grab bars;Regular Cytogeneticist Details (indicate cue type and reason): initially mobilizing to bathroom for BM, pt then feeling the urge to have another BM when almost nearing recliner so BSC pulled up behind him Toileting- Clothing Manipulation and Hygiene: Maximal assistance;Sit to/from stand       Functional mobility during ADLs: Minimal assistance;+2 for safety/equipment;Rolling walker       Vision       Perception     Praxis      Cognition Arousal/Alertness: Awake/alert Behavior During Therapy: WFL for tasks assessed/performed Overall Cognitive Status: Within Functional Limits for tasks assessed                                          Exercises     Shoulder Instructions       General Comments      Pertinent Vitals/ Pain       Pain Assessment: 0-10 Pain Score: 1  Pain Location: l hip Pain Descriptors / Indicators: Aching Pain Intervention(s): Monitored during session  Home Living  Prior Functioning/Environment              Frequency  Min 2X/week        Progress Toward Goals  OT Goals(current goals can now be found in the care plan section)  Progress towards OT goals: Progressing toward goals  Acute Rehab OT Goals Patient Stated Goal: to get better OT Goal Formulation: With patient Time For Goal Achievement: 05/17/20 Potential to Achieve Goals: Good ADL Goals Pt Will Perform Grooming: with supervision;sitting;standing Pt Will Perform Lower Body Bathing: with min guard assist;sit to/from stand;sitting/lateral leans Pt Will Perform Lower Body Dressing: with min assist;sit to/from stand Pt  Will Transfer to Toilet: with supervision;ambulating Pt Will Perform Toileting - Clothing Manipulation and hygiene: with supervision;sit to/from stand  Plan      Co-evaluation      Reason for Co-Treatment: For patient/therapist safety PT goals addressed during session: Mobility/safety with mobility OT goals addressed during session: ADL's and self-care      AM-PAC OT "6 Clicks" Daily Activity     Outcome Measure   Help from another person eating meals?: A Little Help from another person taking care of personal grooming?: A Little Help from another person toileting, which includes using toliet, bedpan, or urinal?: A Lot Help from another person bathing (including washing, rinsing, drying)?: A Lot Help from another person to put on and taking off regular upper body clothing?: A Little Help from another person to put on and taking off regular lower body clothing?: A Lot 6 Click Score: 15    End of Session Equipment Utilized During Treatment: Gait belt;Rolling walker  OT Visit Diagnosis: Unsteadiness on feet (R26.81);Muscle weakness (generalized) (M62.81)   Activity Tolerance Patient tolerated treatment well   Patient Left in chair;with call bell/phone within reach;with chair alarm set;with family/visitor present   Nurse Communication Mobility status        Time: 1110-1134 OT Time Calculation (min): 24 min  Charges: OT General Charges $OT Visit: 1 Visit OT Treatments $Self Care/Home Management : 23-37 mins  Reece Packer OT/L    Peggyann Zwiefelhofer 05/06/2020, 11:35 AM

## 2020-05-06 NOTE — Progress Notes (Signed)
Chaplain engaged in initial visit with Adrian Singh and his daughter.  Chaplain provided information around Advanced Directive.  [name] voiced that his children would be making decisions for him medically and that they plan on having a discussion about Adrian Singh's desires and needs.  Chaplain offered the ministries of presence, listening and prayer as Adrian Singh shared how he was feeling today and about God.  Chaplain left Advanced Directive paperwork with Adrian Singh's daughter.    Chaplain will follow-up and let palliative care chaplain, Dorian Pod, know about conversation.

## 2020-05-06 NOTE — Progress Notes (Signed)
RT was called in the room for Nasotracheal suctioning of the patient. Pt did well with the procedure. Moderate amount suctioned from nose. Pt is doing well at this moment and the vitals are WNL. RT will continue to monitor.

## 2020-05-06 NOTE — Progress Notes (Addendum)
PROGRESS NOTE  Adrian Singh SWN:[id number] DOB: 16-Jan-1934 DOA: 04/30/2020 PCP: [name], MD   LOS: 4 days   Brief narrative: As per HPI,  86 year old male with past medical history of paroxysmal atrial fibrillation(on Xarelto),coronary disease(s/p PCI with stent to circumflex),diastolic congestive heart failure(last echo 02/2016 EF 45-50%),diabetes mellitus type 2, hypertension, gastroesophageal reflux disease, prostate cancer, hyperlipidemia presented to [facility] emergency department status post fall.  Patient has been experiencing progressive weakness over the last 2 months.  He also had dyspnea on exertion and chest discomfort with poor appetite and weight loss.  Had completed Covid vaccination as outpatient.  Due to progressive weakness, patient had a fall without loss of consciousness. Upon evaluation in the emergency department, patient complained of moderate to severe musculoskeletal pain of the left chest wall, bilateral hips and legs. X-rays of all of these areas were negative for fracture. 500 cc bolus of IV fluid was given and the patient was then admitted to hospital for further evaluation and treatment.  Assessment/Plan:  Principal Problem:   Fall at home, initial encounter Active Problems:   Essential hypertension   Uncontrolled type 2 diabetes mellitus with diabetic polyneuropathy, without long-term current use of insulin (HCC)   PAF (paroxysmal atrial fibrillation) (HCC)   Atherosclerosis of native coronary artery of native heart with angina pectoris ([place])   Primary malignant neoplasm of prostate metastatic to bone [facility])   Generalized weakness   Acute on chronic anemia   Elevated CK   Chronic kidney disease, stage 3a   Elevated troponin level not due myocardial infarction   Stroke ([place])   Pressure injury of skin   Fall at home, acute CVA/brain lesions Initial CT scan of the head showed generalized cerebral atrophy.  MRI was done which  showed 5 mm right frontal  centrum semi ovale infarct as well as 1.5-2cm lesion involving R corpus callosum,infarct.  Patient does have history of atrial fibrillation on Xarelto at baseline.  Neurology was consulted who advised a neurosurgical consultation for possible metastatic foci.  Neurosurgery did not feel that way and recommended MRI later on. Started Xarelto since 05/04/2020.  Was initially on heparin drip.  Neurology on board as well.  Patient will need repeat imaging in 1 to 2 months.  Physical therapy  recommended skilled nursing facility placement.     Dysphagia, retained oral secretions.  Speech therapy on board and patient is currently on dysphagia 1 diet.  Palliative care consulted for goals of care. Patient is likely to have a prolonged dysphagia.   Generalized weakness Physical therapy  recommended skilled nursing facility placement on discharge.  Hypokalemia.  Improved with replacement.  Potassium of 3.5.  Hypophosphatemia.  Phosphorus of 1.9.  Will replenish with K-Phos.  Check levels in a.m.  Essential hypertension Continue metoprolol.  ACE inhibitor is on hold   Type 2 diabetes mellitus with diabetic polyneuropathy,  On glimepiride and Metformin at home.  Continue on sliding scale insulin, Accu-Cheks, diabetic  diet while in hospital.  Currently on dysphagia 1 diet.  Latest POC glucose of 179  paroxysmal atrial fibrillation with Mild RVR. Improved.  Continue  metoprolol, xarelto.  Will closely monitor.  Replenish lites.  Elevated troponin, likely demand ischemia.  No acute issues at this time.  Acute on chronic anemia, microcytic With low iron noted in iron profile.  Stool occult was negative.  Patient did have colonoscopy 2011 with benign polyp and is due for repeat screening 2021.  No active bleeding.  Xarelto  has been started.  Closely monitor hemoglobin.  Hemoglobin of 7.1 today.  Chronic kidney disease, stage 3a Creatinine of 0.8.  Will closely  monitor.  Likely at baseline.  Check BMP in a.m.  Metastatic prostate cancer to the bone.  Concern for acute CVA vs neoplastic process in setting of metastatic prostate cancer.  Palliative care has been consulted to assist with goals of care.  Additionally patient has severe hypoalbuminemia and failure to thrive.  Acute hypoxemic respiratory failure Improved, possibly secondary to aspiration. Currently on dysphagia 1 diet.    No indication for antibiotic at this time.  Temperature max of 98.77F. Continue  Incentive spirometry, aspiration precautions.  On nasotracheal suctioning for upper airway secretions.  Received 1 dose of Lasix yesterday for positive fluid balance.  Currently on nasal cannula oxygen.  Chest x-ray was done yesterday without infiltrates.  Stage I pressure ulcer to the coccyx -present on admission, continue preventive protocol.  Pressure Injury 05/03/20 Coccyx Stage 1 -  Intact skin with non-blanchable redness of a localized area usually over a bony prominence. pink (Active)  05/03/20 0908  Location: Coccyx  Location Orientation:   Staging: Stage 1 -  Intact skin with non-blanchable redness of a localized area usually over a bony prominence.  Wound Description (Comments): pink  Present on Admission:     DVT prophylaxis: SCDs Start: 04/30/20 2047,  rivaroxaban (XARELTO) tablet 20 mg    Ethics.  Overall prognosis with the patient is guarded-poor.  There is possibility of metastatic disease his history of prostate cancer.  He has significant hypoalbuminemia and deconditioning.  We will continue to monitor.  Code Status: Full code  Family Communication: I again spoke with the patient's daughter at bedside.  Spoke about palliative care consultation.  Status is: Inpatient  Remains inpatient appropriate because:Unsafe d/c plan, IV treatments appropriate due to intensity of illness or inability to take PO and Inpatient level of care appropriate due to severity of illness ,  significant dysphagia and deconditioning, retained secretions  Dispo: The patient is from: Home              Anticipated d/c is to: SNF              Anticipated d/c date is: 2 days               Patient currently is not medically stable to d/c.  Consultants: Neurology  Palliative care  Procedures:  None  Antibiotics:  . None so far  Anti-infectives (From admission, onward)   None     Subjective: Today, patient was seen and examined at bedside.  Patient did have increased secretions, cough and upper airway issues yesterday and required nasal tracheal suctioning.  Communicative but with generalized weakness.   Objective: Vitals:   05/06/20 0400 05/06/20 0749  BP: (!) 160/75 (!) 153/71  Pulse: 80 84  Resp: 18 18  Temp: 98.1 F (36.7 C) 99.5 F (37.5 C)  SpO2: 100%     Intake/Output Summary (Last 24 hours) at 05/06/2020 0826 Last data filed at 05/06/2020 0600 Gross per 24 hour  Intake 727.04 ml  Output 1250 ml  Net -522.96 ml   Filed Weights   05/04/20 0331 05/05/20 0410 05/06/20 0400  Weight: 73.9 kg 77.1 kg 70.8 kg   Body mass index is 23.72 kg/m.   Physical Exam: GENERAL: Patient is awake and communicative, slow to respond, Appears deconditioned and weak.  More alert awake.  Upper airway noise at times HENT: No scleral  pallor or icterus. Pupils equally reactive to light. Oral mucosa is moist NECK: is supple, no gross swelling noted. CHEST:.  Diminished breath sounds bilaterally.  Coarse breath sounds noted.  Mild rhonchi CVS: S1 and S2 heard, no murmur.  Irregularly irregular rhythm ABDOMEN: Soft, non-tender, bowel sounds are present. EXTREMITIES: No edema. CNS: Cranial nerves are intact. No focal motor deficits. SKIN: warm and dry without rashes.  Data Review: I have personally reviewed the following laboratory data and studies,  CBC: Recent Labs  Lab 04/30/20 1637 04/30/20 1637 05/01/20 0059 05/01/20 0059 05/02/20 0432 05/03/20 0402  05/04/20 1224 05/05/20 0447 05/06/20 0505  WBC 10.2   < > 8.1   < > 8.4 9.9 6.6 6.2 5.2  NEUTROABS 7.5  --  6.0  --   --   --   --   --   --   HGB 8.1*   < > 7.7*   < > 8.1* 8.4* 7.5* 7.3* 7.1*  HCT 27.9*   < > 24.9*   < > 26.4* 27.3* 24.6* 24.4* 23.4*  MCV 79.3*   < > 75.9*   < > 76.5* 77.6* 76.4* 76.7* 76.0*  PLT 229   < > 213   < > 193 211 184 172 168   < > = values in this interval not displayed.   Basic Metabolic Panel: Recent Labs  Lab 05/01/20 0059 05/01/20 0059 05/02/20 0432 05/03/20 0402 05/04/20 1224 05/05/20 0447 05/06/20 0505  NA 138   < > 133* 138 139 141 141  K 3.2*   < > 3.3* 3.5 3.1* 3.3* 3.5  CL 106   < > 102 106 108 111 109  CO2 22   < > 22 20* 22 21* 22  GLUCOSE 126*   < > 96 115* 252* 128* 201*  BUN 21   < > 13 15 22 20 15   CREATININE 1.00   < > 0.80 0.85 0.86 0.90 0.81  CALCIUM 7.7*   < > 7.2* 7.2* 6.9* 7.0* 6.8*  MG 2.0  --   --   --   --  1.9 1.7  PHOS  --   --   --   --   --  1.3* 1.9*   < > = values in this interval not displayed.   Liver Function Tests: Recent Labs  Lab 05/01/20 0059 05/02/20 0432 05/03/20 0402 05/04/20 1224 05/05/20 0447  AST 53* 51* 53* 62* 49*  ALT 18 19 20 18 18   ALKPHOS 159* 185* 192* 250* 236*  BILITOT 0.7 0.7 1.0 0.6 0.6  PROT 6.6 6.4* 6.6 6.1* 6.0*  ALBUMIN 2.0* 2.0* 1.9* 1.7* 1.7*   No results for input(s): LIPASE, AMYLASE in the last 168 hours. No results for input(s): AMMONIA in the last 168 hours. Cardiac Enzymes: Recent Labs  Lab 04/30/20 1637 05/01/20 0059  CKTOTAL 981* 717*   BNP (last 3 results) No results for input(s): BNP in the last 8760 hours.  ProBNP (last 3 results) No results for input(s): PROBNP in the last 8760 hours.  CBG: Recent Labs  Lab 05/05/20 0622 05/05/20 1137 05/05/20 1616 05/05/20 2138 05/06/20 0642  GLUCAP 127* 144* 154* 145* 179*   Recent Results (from the past 240 hour(s))  SARS Coronavirus 2 by RT PCR (hospital order, performed in [facility] hospital lab)  Nasopharyngeal Nasopharyngeal Swab     Status: None   Collection Time: 04/30/20  7:30 PM   Specimen: Nasopharyngeal Swab  Result Value Ref Range Status  SARS Coronavirus 2 NEGATIVE NEGATIVE Final    Comment: (NOTE) SARS-CoV-2 target nucleic acids are NOT DETECTED.  The SARS-CoV-2 RNA is generally detectable in upper and lower respiratory specimens during the acute phase of infection. The lowest concentration of SARS-CoV-2 viral copies this assay can detect is 250 copies / mL. A negative result does not preclude SARS-CoV-2 infection and should not be used as the sole basis for treatment or other patient management decisions.  A negative result may occur with improper specimen collection / handling, submission of specimen other than nasopharyngeal swab, presence of viral mutation(s) within the areas targeted by this assay, and inadequate number of viral copies (<250 copies / mL). A negative result must be combined with clinical observations, patient history, and epidemiological information.  Fact Sheet for Patients:   StrictlyIdeas.no  Fact Sheet for Healthcare Providers: BankingDealers.co.za  This test is not yet approved or  cleared by the [place] FDA and has been authorized for detection and/or diagnosis of SARS-CoV-2 by FDA under an Emergency Use Authorization (EUA).  This EUA will remain in effect (meaning this test can be used) for the duration of the COVID-19 declaration under Section 564(b)(1) of the Act, 21 U.S.C. section 360bbb-3(b)(1), unless the authorization is terminated or revoked sooner.  Performed at [facility], [place] [address], [place], [place] [address]   Urine Culture     Status: Abnormal   Collection Time: 04/30/20  7:30 PM   Specimen: Urine, Random  Result Value Ref Range Status   Specimen Description URINE, RANDOM  Final   Special Requests NONE  Final   Culture (A)  Final    <10,000  COLONIES/mL INSIGNIFICANT GROWTH Performed at [facility], [place] [address] [place], [place] [address]    Report Status 05/01/2020 FINAL  Final     Studies: DG CHEST PORT 1 VIEW  Result Date: 05/05/2020 CLINICAL DATA:  86 year old male with fall and shortness of breath. EXAM: PORTABLE CHEST 1 VIEW COMPARISON:  Chest radiograph dated 05/03/2020 FINDINGS: Mild chronic interstitial coarsening. No focal consolidation, pleural effusion or pneumothorax. The cardiac silhouette is within limits. Atherosclerotic calcification of the aorta. No acute osseous pathology. IMPRESSION: No active disease. Electronically Signed   By: Anner Crete M.D.   On: 05/05/2020 18:21   DG Swallowing Func-Speech Pathology  Result Date: 05/04/2020 Objective Swallowing Evaluation: Type of Study: MBS-Modified Barium Swallow Study  Patient Details Name: CASSIAN TORELLI MRN: [id number] Date of Birth: [birth year] Today's Date: 05/04/2020 Time: SLP Start Time (ACUTE ONLY): 0915 -SLP Stop Time (ACUTE ONLY): 0925 SLP Time Calculation (min) (ACUTE ONLY): 10 min Past Medical History: Past Medical History: Diagnosis Date . Arthritis   "legs, back" (10/06/2015) . Atrial fibrillation ([place])   x1 . CAD (coronary artery disease)   nonobstructive . Cancer ([place])   skin cancer on ear (froze it off) and back (cut it off) . Dysrhythmia  . Esophageal reflux   hx of . History of kidney stones  . Hx of heart artery stent  . Hypertrophy of prostate without urinary obstruction and other lower urinary tract symptoms (LUTS)  . Osteoarthrosis, unspecified whether generalized or localized, unspecified site  . Prostate cancer ([place])  . Skin cancer  . Stroke [facility]) 03/2014  "had a series of mini strokes; maybe 4"; denies residual on 10/06/2015 . Thrombocytopenia ([place])  . Type II diabetes mellitus ([place])   type 2 . Unspecified essential hypertension  Past Surgical History: Past Surgical History: Procedure Laterality Date . BACK  SURGERY    2001 . CARDIAC  CATHETERIZATION  12/2007 . CARDIAC CATHETERIZATION N/A 09/22/2015  Procedure: Left Heart Cath and Coronary Angiography;  Surgeon: Peter M Martinique, MD;  Location: [place] [facility];  Service: Cardiovascular;  Laterality: N/A; . CARDIAC CATHETERIZATION N/A 10/06/2015  Procedure: Coronary Stent Intervention;  Surgeon: Peter M Martinique, MD;  Location: [place] [facility];  Service: Cardiovascular;  Laterality: N/A; . CARDIAC CATHETERIZATION N/A 02/29/2016  Procedure: Left Heart Cath and Coronary Angiography;  Surgeon: Jettie Booze, MD;  Location: [place] [facility];  Service: Cardiovascular;  Laterality: N/A; . COLONOSCOPY W/ BIOPSIES AND POLYPECTOMY   . CORONARY STENT PLACEMENT  10/06/2015  [name]  with DES . EAR CYST EXCISION N/A 04/06/2015  Procedure: EXCISION OF SCALP CYST;  Surgeon: Donnie Mesa, MD;  Location: [place];  Service: General;  Laterality: N/A; . ESOPHAGOGASTRODUODENOSCOPY (EGD) WITH ESOPHAGEAL DILATION  2001  with dilation . LUMBAR DISC SURGERY  09/26/1999  "cleaned out arthritis and bone spurs" . SHOULDER ARTHROSCOPY W/ ROTATOR CUFF REPAIR Left 2005 . THULIUM LASER TURP (TRANSURETHRAL RESECTION OF PROSTATE) N/A 01/21/2017  Procedure: Marcelino Duster LASER TURP (TRANSURETHRAL RESECTION OF PROSTATE) CAUTERIZATION OF BLADDER LESION;  Surgeon: Franchot Gallo, MD;  Location: [facility];  Service: Urology;  Laterality: N/A; HPI: Pt is an 86 yo male presenting s/p fall with L sided weakness and slurred speech. CXR 8/15 without active disease. MRI showed acute infarct involving the R frontal centrum semi ovale as well as a hyperintense lesion involving the posterior R frontal white matter and R corpus callosum favored to reflect subacute ischemic infarct. PMH includes: HTN, DM, stroke, prostate ca, skin ca, GER, esophageal dilation, CAD, afib. Rapid Response on 8/17 secondary to O2 desauturation to the 80s. Pt was suctioned and placed on NRB. CXR 8/17: Shallow lung inflation with left basilar atelectasis  Subjective: alert,  speech is mumbled Assessment / Plan / Recommendation CHL IP CLINICAL IMPRESSIONS 05/04/2020 Clinical Impression Pt demonstrates a severe oropharyngeal dysphagia, likely secondary to a primary esophageal impairment. Pt has relatively good oral phase, but there are instances of slight delay in swallow initiation with frank penetration prior to full laryngeal closure. Of greater concern is the mild vallecular and moderate pyriform residue present consistently with all liquids despite adequate appearing laryngeal elevation, hyoid excursion and base of tongue retraction. Esophageal sweep shows concern for a significant motility impairement with severe residue and suspected esophageal spasm (no radiologist present to confirm). Poor esophageal peristalsis can impact appropriate negative pressure in the pharynx, which is suspected in this case. Regardless of liquid texture, positional strategies, or compensatory strategies, pt has  mild penetration after the swallow, leading to eventual silent aspiration. If pt can clear his throat preventatively, he will improve his airway protection. Recommend thin liquids as there is risk across textures. Pt is capable of mech soft orally, but esophageal clearance of solids may be difficult. Recommend pt continue purees for now with thin liquids. Will f/u for tolerance and pt/family education.  SLP Visit Diagnosis Dysphagia, oropharyngeal phase (R13.12) Attention and concentration deficit following -- Frontal lobe and executive function deficit following -- Impact on safety and function Moderate aspiration risk   CHL IP TREATMENT RECOMMENDATION 05/04/2020 Treatment Recommendations Therapy as outlined in treatment plan below   Prognosis 05/01/2020 Prognosis for Safe Diet Advancement Good Barriers to Reach Goals -- Barriers/Prognosis Comment -- CHL IP DIET RECOMMENDATION 05/04/2020 SLP Diet Recommendations -- Liquid Administration via -- Medication Administration Crushed with puree  Compensations Clear throat after each  swallow;Multiple dry swallows after each bite/sip Postural Changes --   No flowsheet data found.  CHL IP FOLLOW UP RECOMMENDATIONS 05/04/2020 Follow up Recommendations 24 hour supervision/assistance   CHL IP FREQUENCY AND DURATION 05/04/2020 Speech Therapy Frequency (ACUTE ONLY) min 2x/week Treatment Duration 2 weeks      CHL IP ORAL PHASE 05/04/2020 Oral Phase Impaired Oral - Pudding Teaspoon -- Oral - Pudding Cup -- Oral - Honey Teaspoon -- Oral - Honey Cup WFL Oral - Nectar Teaspoon -- Oral - Nectar Cup WFL Oral - Nectar Straw WFL Oral - Thin Teaspoon -- Oral - Thin Cup WFL Oral - Thin Straw WFL Oral - Puree WFL Oral - Mech Soft Delayed oral transit;Reduced posterior propulsion Oral - Regular -- Oral - Multi-Consistency -- Oral - Pill WFL Oral Phase - Comment --  CHL IP PHARYNGEAL PHASE 05/04/2020 Pharyngeal Phase Impaired Pharyngeal- Pudding Teaspoon -- Pharyngeal -- Pharyngeal- Pudding Cup -- Pharyngeal -- Pharyngeal- Honey Teaspoon -- Pharyngeal -- Pharyngeal- Honey Cup Pharyngeal residue - valleculae;Pharyngeal residue - pyriform;Penetration/Apiration after swallow Pharyngeal Material enters airway, remains ABOVE vocal cords and not ejected out Pharyngeal- Nectar Teaspoon -- Pharyngeal -- Pharyngeal- Nectar Cup Penetration/Apiration after swallow;Penetration/Aspiration during swallow;Pharyngeal residue - valleculae;Pharyngeal residue - pyriform;Trace aspiration Pharyngeal Material enters airway, remains ABOVE vocal cords and not ejected out;Material enters airway, CONTACTS cords and not ejected out;Material enters airway, passes BELOW cords without attempt by patient to eject out (silent aspiration) Pharyngeal- Nectar Straw Penetration/Apiration after swallow;Penetration/Aspiration during swallow;Pharyngeal residue - valleculae;Pharyngeal residue - pyriform;Trace aspiration Pharyngeal Material enters airway, passes BELOW cords without attempt by patient to eject out (silent  aspiration);Material enters airway, CONTACTS cords and not ejected out;Material enters airway, remains ABOVE vocal cords and not ejected out Pharyngeal- Thin Teaspoon -- Pharyngeal -- Pharyngeal- Thin Cup Penetration/Apiration after swallow;Penetration/Aspiration during swallow;Pharyngeal residue - valleculae;Pharyngeal residue - pyriform;Trace aspiration Pharyngeal Material enters airway, passes BELOW cords without attempt by patient to eject out (silent aspiration);Material enters airway, CONTACTS cords and not ejected out;Material enters airway, remains ABOVE vocal cords then ejected out;Material enters airway, remains ABOVE vocal cords and not ejected out Pharyngeal- Thin Straw -- Pharyngeal -- Pharyngeal- Puree Pharyngeal residue - valleculae;Pharyngeal residue - pyriform Pharyngeal -- Pharyngeal- Mechanical Soft Pharyngeal residue - valleculae;Pharyngeal residue - pyriform Pharyngeal -- Pharyngeal- Regular -- Pharyngeal -- Pharyngeal- Multi-consistency -- Pharyngeal -- Pharyngeal- Pill -- Pharyngeal -- Pharyngeal Comment --  No flowsheet data found. Herbie Baltimore, MA CCC-SLP Acute Rehabilitation Services Pager [phone] Office [phone] Lynann Beaver 05/04/2020, 11:34 AM                 Flora Lipps, MD  [facility] 05/06/2020

## 2020-05-06 NOTE — Progress Notes (Signed)
Nutrition Follow-up  DOCUMENTATION CODES:   Severe malnutrition in context of chronic illness  INTERVENTION:   -D/c Nepro -Continue MVI with minerals daily -Ensure Enlive po BID, each supplement provides 350 kcal and 20 grams of protein -Continue Magic cup TID with meals, each supplement provides 290 kcal and 9 grams of protein -Hormel Shake TID with meals, each supplement provides 520 kcals and 22 grams protein  NUTRITION DIAGNOSIS:   Severe Malnutrition related to chronic illness (prostate cancer) as evidenced by moderate fat depletion, severe fat depletion, moderate muscle depletion, severe muscle depletion, percent weight loss.  Ongoing  GOAL:   Patient will meet greater than or equal to 90% of their needs  Progressing   MONITOR:   PO intake, Supplement acceptance, Diet advancement, Labs, Weight trends, Skin, I & O's  REASON FOR ASSESSMENT:   Consult Assessment of nutrition requirement/status  ASSESSMENT:   86 year old male with past medical history of paroxysmal atrial fibrillation (on Xarelto), coronary disease (s/p PCI with stent to circumflex), diastolic congestive heart failure (last echo 02/2016 EF 45-50%), diabetes mellitus type 2, hypertension, gastroesophageal reflux disease, prostate cancer, hyperlipidemia who presents to [facility] emergency department status post fall.  8/15- s/p BSE- recommend regular diet with thin liquids 8/16- s/p BSE- downgraded to nectar thick liquids 8/17- downgraded to dysphagia 1 diet with nectar thick liquids 8/18- s/p MBSS- recommended dysphagia 1 diet with thin liquids  Reviewed I/O's: -523 ml x 24 hours and +5.1 L since admission  UOP: 1.3 L x 24 hours  Spoke with pt and daughter at bedside. Pt shares that he has had a poor appetite for at least the past month. Per daughter, pt has been experiencing poor appetite as well as side effects from a chemotherapy medication which makes him not want to eat.   Pt reports  that swallow function continues to improve, however, still has trouble with purees. Daughter reports pt did much better with breakfast, as she thinned the purees out with some water and administered in medication cup. Noted meal completion 5-25%.   Pt endorses a 20-30% weight loss over the past month secondary to decreased oral intake. Reviewed wt hx; pt has experienced a 12.9% wt loss over the past 6 months, which is significant for time frame.  Discussed importance of good meal and supplement intake to promote healing.    Labs reviewed: Phos: 1.9 (on IV supplementation), CBGS: 145-179 (inpatient orders for glycemic control are 0-15 units inuslin aspart TID with meals and at bedtime).   NUTRITION - FOCUSED PHYSICAL EXAM:    Most Recent Value  Orbital Region Severe depletion  Upper Arm Region Moderate depletion  Thoracic and Lumbar Region Moderate depletion  Buccal Region Severe depletion  Temple Region Severe depletion  Clavicle and Acromion Bone Region Severe depletion  Scapular Bone Region Severe depletion  Dorsal Hand Mild depletion  Patellar Region Mild depletion  Anterior Thigh Region Mild depletion  Posterior Calf Region Mild depletion  Edema (RD Assessment) Mild  Hair Reviewed  Eyes Reviewed  Mouth Reviewed  Skin Reviewed  Nails Reviewed       Diet Order:   Diet Order            DIET - DYS 1 Room service appropriate? Yes with Assist; Fluid consistency: Thin  Diet effective now                 EDUCATION NEEDS:   Education needs have been addressed  Skin:  Skin Assessment: Skin Integrity Issues:  Skin Integrity Issues:: Stage I, Incisions Stage I: coccyx Incisions: closed mid back  Last BM:  05/03/20  Height:   Ht Readings from Last 1 Encounters:  05/01/20 5\' 8"  (1.727 m)    Weight:   Wt Readings from Last 1 Encounters:  05/06/20 70.8 kg    Ideal Body Weight:  70 kg  BMI:  Body mass index is 23.72 kg/m.  Estimated Nutritional Needs:   Kcal:   1850-2050  Protein:  95-110 grams  Fluid:  > 1.8 L    Loistine Chance, RD, LDN, [place] Registered Dietitian II Certified Diabetes Care and Education Specialist Please refer to [facility] for RD and/or RD on-call/weekend/after hours pager

## 2020-05-06 NOTE — Social Work (Signed)
Spoke to Palliative Care who has discussed hospice with the pt who is considering options. Palliative to contact TOC if hospice consult needed. Will continue to follow.   Wandra Feinstein, MSW, LCSW [phone] (coverage)

## 2020-05-07 LAB — CBC
HCT: 24.4 % — ABNORMAL LOW (ref 39.0–52.0)
Hemoglobin: 7.4 g/dL — ABNORMAL LOW (ref 13.0–17.0)
MCH: 23.1 pg — ABNORMAL LOW (ref 26.0–34.0)
MCHC: 30.3 g/dL (ref 30.0–36.0)
MCV: 76 fL — ABNORMAL LOW (ref 80.0–100.0)
Platelets: 174 10*3/uL (ref 150–400)
RBC: 3.21 MIL/uL — ABNORMAL LOW (ref 4.22–5.81)
RDW: 18.3 % — ABNORMAL HIGH (ref 11.5–15.5)
WBC: 5.7 10*3/uL (ref 4.0–10.5)
nRBC: 0 % (ref 0.0–0.2)

## 2020-05-07 LAB — PHOSPHORUS: Phosphorus: 1.9 mg/dL — ABNORMAL LOW (ref 2.5–4.6)

## 2020-05-07 LAB — COMPREHENSIVE METABOLIC PANEL
ALT: 17 U/L (ref 0–44)
AST: 29 U/L (ref 15–41)
Albumin: 1.6 g/dL — ABNORMAL LOW (ref 3.5–5.0)
Alkaline Phosphatase: 180 U/L — ABNORMAL HIGH (ref 38–126)
Anion gap: 11 (ref 5–15)
BUN: 12 mg/dL (ref 8–23)
CO2: 22 mmol/L (ref 22–32)
Calcium: 7 mg/dL — ABNORMAL LOW (ref 8.9–10.3)
Chloride: 107 mmol/L (ref 98–111)
Creatinine, Ser: 0.82 mg/dL (ref 0.61–1.24)
GFR calc Af Amer: >60 mL/min (ref >60)
GFR calc non Af Amer: >60 mL/min (ref >60)
Glucose, Bld: 167 mg/dL — ABNORMAL HIGH (ref 70–99)
Potassium: 3.2 mmol/L — ABNORMAL LOW (ref 3.5–5.1)
Sodium: 140 mmol/L (ref 135–145)
Total Bilirubin: 0.7 mg/dL (ref 0.3–1.2)
Total Protein: 6 g/dL — ABNORMAL LOW (ref 6.5–8.1)

## 2020-05-07 LAB — GLUCOSE, CAPILLARY
Glucose-Capillary: 172 mg/dL — ABNORMAL HIGH (ref 70–99)
Glucose-Capillary: 242 mg/dL — ABNORMAL HIGH (ref 70–99)
Glucose-Capillary: 145 mg/dL — ABNORMAL HIGH (ref 70–99)
Glucose-Capillary: 160 mg/dL — ABNORMAL HIGH (ref 70–99)

## 2020-05-07 LAB — MAGNESIUM: Magnesium: 1.7 mg/dL (ref 1.7–2.4)

## 2020-05-07 MED ORDER — SODIUM CHLORIDE 0.9 % IV SOLN
510.0000 mg | Freq: Once | INTRAVENOUS | Status: AC
  Administered 2020-05-07: 510 mg via INTRAVENOUS
  Filled 2020-05-07: qty 17

## 2020-05-07 MED ORDER — POTASSIUM CHLORIDE 10 MEQ/100ML IV SOLN
10.0000 meq | INTRAVENOUS | Status: AC
  Administered 2020-05-07 – 2020-05-08 (×6): 10 meq via INTRAVENOUS
  Filled 2020-05-07 (×6): qty 100

## 2020-05-07 MED ORDER — MAGNESIUM SULFATE 2 GM/50ML IV SOLN
2.0000 g | Freq: Once | INTRAVENOUS | Status: AC
  Administered 2020-05-07: 2 g via INTRAVENOUS
  Filled 2020-05-07: qty 50

## 2020-05-07 NOTE — Progress Notes (Signed)
Palliative Medicine Inpatient Follow Up Note   HPI: 86 year old male who presented to ED with weakness post fall. Also had difficulty swallowing think liquids. He was treated for acute respiratory failure and low iron. PMH included Pressure ulcer stage 1 (redness nonblanching) on coccyx (tailbone region), Atrial fibrillation on Xarelto (blood thinner), Coronary artery disease (has had percutaneous intervention with stent to circumflex artery), Diastolic heart failure, Diabetes mellitus type 2, Hypertension, Gastroesophageal reflux disease,  Chronic kidney disease stage 3a, known metastatic Prostate cancer, and High lipid levels (hyperlipidemia). MRI acute infarct right frontal lobe few small chronic infarcts in right frontoparietal  Region. CT of maxillofacial and chest, abdomen and pelvis showed sclerotic foci at C3 vertebral body, previously identified mass as above in right frontoparietal region, multiple sclerotic osseous metastatic disease thoughout chest, abdomen, pelvis; stable right lower lobe  5 mm pulmonary nodule, and a stable low density lesion in the pancreatic head.   Today's Discussion (05/07/20 ): goals of care, advanced directives and plans at discharge. Please see previous palliative consult note for colleague's conversations.  I met with Neven Fina and his daughter [name] at the bedside. Patient remembered discussion with Sharyn Lull PM NP yesterday about metastasis but daughter [name] was at bedside today and unaware. We reviewed all of his chronic medical conditions and again discussed a path of aggressive medical interventions versus palliative comfort measures. He wants to try to get better and to be able to care for self. At this poitn he wishes for agressiveness treatment.   After our conversation and review of MOST  I left MOST form with them for discussion with rest of family and returned later for completion of the form. It is scanned in Media. He has emotional support from family.  [name] expressed they would like him to try rehab and this will give them time to explore other options if needed. She stated returning to his home with outpatient rehab or home health was not a good option.   I discussed the importance of continued conversation with family and their  medical providers regarding overall plan of care and treatment options, ensuring decisions are within the context of the patient's values and GOCs.We also discussed that their decisions could change in the future and a MOST could be revised.   Provided daughter [name] with "Hard Choices for Loving People" booklet.   We had significant conversation regarding his current debilitated state. He wishes to pursue rehab to see if his strength and ADLs can improve.  Questions and concerns addressed.   Subjective Assessment:  Vital Signs Vitals:   05/07/20 0230 05/07/20 0300  BP:  123/67  Pulse: 93 92  Resp: 12 16  Temp:  98.1 F (36.7 C)  SpO2: 99% 99%    Intake/Output Summary (Last 24 hours) at 05/07/2020 1550 Last data filed at 05/07/2020 0100 Gross per 24 hour  Intake 50 ml  Output 500 ml  Net -450 ml   Last Weight  Most recent update: 05/07/2020  5:44 AM   Weight  71.2 kg (157 lb)            SUMMARY OF RECOMMENDATIONS    FULL CODE- Patient and family choose CPR, limited additional interventions if the patient has a pulse and/or is breathing, antibiotics if indicated, IV fluids if indicated and feeding tube for trial period on MOST form.   Symptom management:  Dysphagia and failure to thrive: -ST -Dietician is consulting with nutritional supplements ordered -1:1 feeding encouraged, discussed small  frequent meals - magic mouthwash -chloraseptic spray for dry throat - aspiration precautions  Pain: -Tramadol -Morphine for severe pain  Weakness and fatigue: -PT, OT evaluations  Need for emotional support: - Utilize therapeutic listening, -Ensure safe place to express concerns -Sit on eye  level with conversations and provide eye contact.  Palliative prophylaxis: -Oral care, mobility  Additional Recommendations: -Full score of treatment per patient and family wishes  Psychosocial/Spirtual: -Chaplaincy support, [employer] faith  Discharge planning: OT has recommended SNF. Patient and family are in agreement to SNF. Daughter stated [facility] near patient's home in [place].   Time In:11:00 Time Out:11:40 Time Spent: >35 minutes Greater than 50% of the time was spent in counseling and coordination of care ______________________________________________________________________________________ Lindell Spar, [place] Team Team Cell Phone: [phone] Please utilize secure chat with additional questions, if there is no response within 30 minutes please call the above phone number  Palliative Medicine Team providers are available by phone from 7am to 7pm daily and can be reached through the team cell phone.  Should this patient require assistance outside of these hours, please call the patient's attending physician.

## 2020-05-07 NOTE — Progress Notes (Signed)
PROGRESS NOTE    Adrian Singh  IOM:[id number] DOB: 10/05/1933 DOA: 04/30/2020 PCP: [name], MD    Brief Narrative:  Patient admitted to the hospital with a working diagnosis of acute CVA.  86 year old male past medical history for paroxysmal atrial fibrillation, coronary artery disease, diastolic heart failure, type 2 diabetes mellitus, hypertension, GERD, prostate cancer and dyslipidemia.  Patient had 2 months of progressive, generalized weakness, positive chest pain, but no dyspnea.  It has been associated with decreased p.o. intake and 15 pound weight loss.  The day prior to hospitalization while trying to get up for the toilet in the bathroom he fell, no head trauma but he was not able to stand back up by himself.  He lay on the floor for about 12 hours until her daughter arrived to the hospital.  She noted him having difficulty moving his left side and having slurred speech.  On his initial physical examination blood pressure 126/64, heart rate 84, respiratory rate 16, temperature 97.8, oxygen saturation 99%, lungs are clear to auscultation bilaterally, heart S1-S2 present regular, soft abdomen, no lower extremity edema.  He had decreased strength bilateral lower extremities, but able to follow commands and respond to verbal stimuli. Sodium 138, potassium 3.4, chloride 94, bicarb 22, glucose 98, BUN 25, creatinine 1.32, CK 981, white count 10.2, hemoglobin 8.1, hematocrit 27.9, platelets 229.  SARS COVID-19 negative.  Urinalysis negative for infection, specific gravity 1.021.  Chest radiograph no infiltrates.  EKG 81 bpm, left axis deviation, normal intervals, atrial fibrillation, positive PVCs, no ST segment or T wave changes.  Head CT with cerebral atrophy, no acute changes.  Hips/legs films negative for fracture.  Further work-up with brain MRI showed a 5 mm right frontal centrum semiovale infarct, 1.5-2 cm lesion posterior right corpus callosum infarct.  Neurology recommended  possible work-up for brain metastasis related to prostate cancer.  Neurosurgery recommended nonsurgical approach, and follow-up with brain MRI after discharge.  Patient very weak and deconditioned, no pending placement at skilled nursing facility  Assessment & Plan:   Principal Problem:   Stroke [facility]) Active Problems:   Essential hypertension   Uncontrolled type 2 diabetes mellitus with diabetic polyneuropathy, without long-term current use of insulin (HCC)   PAF (paroxysmal atrial fibrillation) (HCC)   Atherosclerosis of native coronary artery of native heart with angina pectoris ([place])   Primary malignant neoplasm of prostate metastatic to bone [facility])   Generalized weakness   Fall at home, initial encounter   Acute on chronic anemia   Elevated CK   Chronic kidney disease, stage 3a   Elevated troponin level not due myocardial infarction   Pressure injury of skin   Protein-calorie malnutrition, severe   Palliative care by specialist   Goals of care, counseling/discussion   DNR (do not resuscitate) discussion   FTT (failure to thrive) in adult   Need for emotional support   1. Acute 5 mm right frontal centrum semiovale infarct. 1.5-2 cm posterior right corpus callosum infarct/ ambulatory and swallow dysfunction. Patient is very weak and deconditioned. He is able to respond to simple questions and follow simple commands.  Continue with dysphagia one diet and aspiration precautions. Her family wants him to be discharge to SNF not to hospice.   Continue with atorvastatin, and antithrombotic therapy with rivaroxaban.   Follow up brain MRI as outpatient.   2. CKD stage 3a with Hypokalemia, hypocalcemia, hypomagnesemia and hypophosphatemia. Stable renal function with serum cr at 0,82, K at 3,2 and serum  bicarbonate at 22, Mg 1,7 and P 1.9, Ca 7,0 (albumin 1,6), corrected Ca 8,9.    Patient continue with poor oral intake, will continue K correction with IV Kcl (60 meq) and Mag with IV Mg  2 g.  Follow up on renal function in am. Keep P more than 1.   3. HTN Continue blood pressure control with metoprolol.   4. Uncontrolled T2DM Hgb A1c 7,5/ dyslipidemia Continue glucose control and monitoring with insulin sliding scale. Patient with poor oral intake.   Continue with statin therapy.   5. Paroxysmal atrial fibrillation/ elevated troponin.  No signs of acute coronary syndrome, continue rate control with metoprolol and antithrombotic therapy with rivaroxaban.   6. Acute hypoxemic respiratory failure. Transitory, continue supplemental 02 per [place], add incentive spirometer as tolerated.  Hold on diuretic therapy, no signs of heart failure or volume overload.   7. Metastatic prostate cancer/ acute on chronic anemia/ iron deficiency/ severe calorie protein malnutrition. Hgb is down to 7,4 with Hct 24, if continue to be low will consider prbc transfusion, that may improve physical functional capacity.    Iron 13, tibc 238, transferrin saturation 5, will add one dose of IV iron.   Continue with nutritional supplementation.   8. Stage 1 pressure ulcer coccyx, present on admission. Continue with local wound care.     Status is: Inpatient  Remains inpatient appropriate because:Inpatient level of care appropriate due to severity of illness   Dispo:  Patient From: Home  Planned Disposition: [place]  Expected discharge date: 05/09/20  Medically stable for discharge: No   DVT prophylaxis: rivaroxaban   Code Status:    full  Family Communication:  I spoke with patient's daughter at the bedside, we talked in detail about patient's condition, plan of care and prognosis and all questions were addressed.       Nutrition Status: Nutrition Problem: Severe Malnutrition Etiology: chronic illness (prostate cancer) Signs/Symptoms: moderate fat depletion, severe fat depletion, moderate muscle depletion, severe muscle depletion, percent weight loss Percent weight loss:  12.6 % Interventions: Ensure Enlive (each supplement provides 350kcal and 20 grams of protein), Magic cup, MVI, Hormel Shake     Skin Documentation: Pressure Injury 05/03/20 Coccyx Stage 1 -  Intact skin with non-blanchable redness of a localized area usually over a bony prominence. pink (Active)  05/03/20 0908  Location: Coccyx  Location Orientation:   Staging: Stage 1 -  Intact skin with non-blanchable redness of a localized area usually over a bony prominence.  Wound Description (Comments): pink  Present on Admission:       Subjective: Patient is very weak and deconditioned, able to respond to simple questions and follow commands, no nausea or vomiting, no chest pain or dyspnea.,   Objective: Vitals:   05/06/20 2204 05/07/20 0215 05/07/20 0230 05/07/20 0300  BP: 125/61 123/67  123/67  Pulse: 83 (!) 137 93 92  Resp: 19 17 12 16   Temp: 98.3 F (36.8 C)   98.1 F (36.7 C)  TempSrc: Oral   Oral  SpO2: 97% 97% 99% 99%  Weight:    71.2 kg  Height:        Intake/Output Summary (Last 24 hours) at 05/07/2020 1335 Last data filed at 05/07/2020 0100 Gross per 24 hour  Intake 50 ml  Output 500 ml  Net -450 ml   Filed Weights   05/05/20 0410 05/06/20 0400 05/07/20 0300  Weight: 77.1 kg 70.8 kg 71.2 kg    Examination:   General:  Not in pain or dyspnea, deconditioned  Neurology: Awake and alert, non focal  E ENT: positive pallor, no icterus, oral mucosa moist Cardiovascular: No JVD. S1-S2 present, rhythmic, no gallops, rubs, or murmurs. No lower extremity edema. Pulmonary: positive breath sounds bilaterally, adequate air movement, no wheezing, rhonchi or rales. Gastrointestinal. Abdomen soft and non tender Skin. No rashes Musculoskeletal: no joint deformities     Data Reviewed: I have personally reviewed following labs and imaging studies  CBC: Recent Labs  Lab 04/30/20 1637 04/30/20 1637 05/01/20 0059 05/02/20 0432 05/03/20 0402 05/04/20 1224 05/05/20 0447  05/06/20 0505 05/07/20 0243  WBC 10.2   < > 8.1   < > 9.9 6.6 6.2 5.2 5.7  NEUTROABS 7.5  --  6.0  --   --   --   --   --   --   HGB 8.1*   < > 7.7*   < > 8.4* 7.5* 7.3* 7.1* 7.4*  HCT 27.9*   < > 24.9*   < > 27.3* 24.6* 24.4* 23.4* 24.4*  MCV 79.3*   < > 75.9*   < > 77.6* 76.4* 76.7* 76.0* 76.0*  PLT 229   < > 213   < > 211 184 172 168 174   < > = values in this interval not displayed.   Basic Metabolic Panel: Recent Labs  Lab 05/01/20 0059 05/02/20 0432 05/03/20 0402 05/04/20 1224 05/05/20 0447 05/06/20 0505 05/07/20 0243  NA 138   < > 138 139 141 141 140  K 3.2*   < > 3.5 3.1* 3.3* 3.5 3.2*  CL 106   < > 106 108 111 109 107  CO2 22   < > 20* 22 21* 22 22  GLUCOSE 126*   < > 115* 252* 128* 201* 167*  BUN 21   < > 15 22 20 15 12   CREATININE 1.00   < > 0.85 0.86 0.90 0.81 0.82  CALCIUM 7.7*   < > 7.2* 6.9* 7.0* 6.8* 7.0*  MG 2.0  --   --   --  1.9 1.7 1.7  PHOS  --   --   --   --  1.3* 1.9* 1.9*   < > = values in this interval not displayed.   GFR: Estimated Creatinine Clearance: 66 mL/min (by C-G formula based on SCr of 0.82 mg/dL). Liver Function Tests: Recent Labs  Lab 05/02/20 0432 05/03/20 0402 05/04/20 1224 05/05/20 0447 05/07/20 0243  AST 51* 53* 62* 49* 29  ALT 19 20 18 18 17   ALKPHOS 185* 192* 250* 236* 180*  BILITOT 0.7 1.0 0.6 0.6 0.7  PROT 6.4* 6.6 6.1* 6.0* 6.0*  ALBUMIN 2.0* 1.9* 1.7* 1.7* 1.6*   No results for input(s): LIPASE, AMYLASE in the last 168 hours. No results for input(s): AMMONIA in the last 168 hours. Coagulation Profile: Recent Labs  Lab 04/30/20 1930  INR 1.4*   Cardiac Enzymes: Recent Labs  Lab 04/30/20 1637 05/01/20 0059  CKTOTAL 981* 717*   BNP (last 3 results) No results for input(s): PROBNP in the last 8760 hours. HbA1C: No results for input(s): HGBA1C in the last 72 hours. CBG: Recent Labs  Lab 05/06/20 1145 05/06/20 1626 05/06/20 2151 05/07/20 0619 05/07/20 1135  GLUCAP 254* 146* 145* 172* 242*   Lipid  Profile: No results for input(s): CHOL, HDL, LDLCALC, TRIG, CHOLHDL, LDLDIRECT in the last 72 hours. Thyroid Function Tests: No results for input(s): TSH, T4TOTAL, FREET4, T3FREE, THYROIDAB in the last 72 hours. Anemia  Panel: No results for input(s): VITAMINB12, FOLATE, FERRITIN, TIBC, IRON, RETICCTPCT in the last 72 hours.    Radiology Studies: I have reviewed all of the imaging during this hospital visit personally     Scheduled Meds: . atorvastatin  20 mg Oral Daily  . doxazosin  1 mg Oral QHS  . feeding supplement (ENSURE ENLIVE)  237 mL Oral TID BM  . insulin aspart  0-15 Units Subcutaneous TID AC & HS  . metoprolol tartrate  12.5 mg Oral BID  . multivitamin with minerals  1 tablet Oral Daily  . rivaroxaban  20 mg Oral Q supper   Continuous Infusions:   LOS: 5 days        Nashae Maudlin Gerome Apley, MD

## 2020-05-07 NOTE — Progress Notes (Signed)
   05/07/20 0215  Assess: MEWS Score  BP 123/67  Pulse Rate (!) 137  ECG Heart Rate (!) 137  Resp 17  SpO2 97 %  Assess: MEWS Score  MEWS Temp 0  MEWS Systolic 0  MEWS Pulse 3  MEWS RR 0  MEWS LOC 0  MEWS Score 3  MEWS Score Color Yellow  Assess: if the MEWS score is Yellow or Red  Were vital signs taken at a resting state? Yes  Focused Assessment No change from prior assessment  Early Detection of Sepsis Score *See Row Information* Low  MEWS guidelines implemented *See Row Information* No, previously yellow, continue vital signs every 4 hours  Treat  MEWS Interventions Other (Comment) (pt frequently coughing, oral suction done. HR trends down)  Pain Scale 0-10  Pain Score 2  Faces Pain Scale 2  Pain Type Acute pain  Pain Location Throat  Notify: Charge Nurse/RN  Name of Charge Nurse/RN Notified [place]  Date Charge Nurse/RN Notified 05/07/20  Time Charge Nurse/RN Notified 0240  Document  Patient Outcome Stabilized after interventions  Progress note created (see row info) Yes  - pt frequently coughing, productive cough noted. Encouraged pt to take a deep breathe and cough out. -Oral suctioned pt. - pt HR trends down  -will monitor

## 2020-05-08 LAB — PREPARE RBC (CROSSMATCH)

## 2020-05-08 LAB — GLUCOSE, CAPILLARY
Glucose-Capillary: 167 mg/dL — ABNORMAL HIGH (ref 70–99)
Glucose-Capillary: 212 mg/dL — ABNORMAL HIGH (ref 70–99)
Glucose-Capillary: 147 mg/dL — ABNORMAL HIGH (ref 70–99)
Glucose-Capillary: 104 mg/dL — ABNORMAL HIGH (ref 70–99)

## 2020-05-08 LAB — BASIC METABOLIC PANEL
Anion gap: 7 (ref 5–15)
BUN: 12 mg/dL (ref 8–23)
CO2: 25 mmol/L (ref 22–32)
Calcium: 7.6 mg/dL — ABNORMAL LOW (ref 8.9–10.3)
Chloride: 107 mmol/L (ref 98–111)
Creatinine, Ser: 0.73 mg/dL (ref 0.61–1.24)
GFR calc Af Amer: >60 mL/min (ref >60)
GFR calc non Af Amer: >60 mL/min (ref >60)
Glucose, Bld: 172 mg/dL — ABNORMAL HIGH (ref 70–99)
Potassium: 3.7 mmol/L (ref 3.5–5.1)
Sodium: 139 mmol/L (ref 135–145)

## 2020-05-08 LAB — HEMOGLOBIN AND HEMATOCRIT, BLOOD
HCT: 23.9 % — ABNORMAL LOW (ref 39.0–52.0)
HCT: 28.8 % — ABNORMAL LOW (ref 39.0–52.0)
Hemoglobin: 7.1 g/dL — ABNORMAL LOW (ref 13.0–17.0)
Hemoglobin: 9 g/dL — ABNORMAL LOW (ref 13.0–17.0)

## 2020-05-08 LAB — MAGNESIUM: Magnesium: 2.2 mg/dL (ref 1.7–2.4)

## 2020-05-08 MED ORDER — LOSARTAN POTASSIUM 50 MG PO TABS
50.0000 mg | ORAL_TABLET | Freq: Every day | ORAL | Status: DC
  Administered 2020-05-08 – 2020-05-12 (×5): 50 mg via ORAL
  Filled 2020-05-08 (×5): qty 1

## 2020-05-08 MED ORDER — GUAIFENESIN-DM 100-10 MG/5ML PO SYRP
5.0000 mL | ORAL_SOLUTION | Freq: Four times a day (QID) | ORAL | Status: DC
  Administered 2020-05-08 – 2020-05-11 (×11): 5 mL via ORAL
  Filled 2020-05-08 (×11): qty 5

## 2020-05-08 MED ORDER — SODIUM CHLORIDE 0.9% IV SOLUTION: Freq: Once | INTRAVENOUS | Status: DC

## 2020-05-08 NOTE — Progress Notes (Signed)
Blood consent signed by son due to weakness , pt is agreeable to procedure but unable to sign.  Consent in chart

## 2020-05-08 NOTE — Progress Notes (Signed)
PROGRESS NOTE    Adrian Singh  WUX:[id number] DOB: 1933-12-30 DOA: 04/30/2020 PCP: [name], MD    Brief Narrative:  Patient admitted to the hospital with a working diagnosis of acute CVA.  86 year old male past medical history for paroxysmal atrial fibrillation, coronary artery disease, diastolic heart failure, type 2 diabetes mellitus, hypertension, GERD, prostate cancer and dyslipidemia.  Patient had 2 months of progressive, generalized weakness, positive chest pain, but no dyspnea.  It has been associated with decreased p.o. intake and 15 pound weight loss.  The day prior to hospitalization while trying to get up for the toilet in the bathroom he fell, no head trauma but he was not able to stand back up by himself.  He lay on the floor for about 12 hours until her daughter arrived to the hospital.  She noted him having difficulty moving his left side and having slurred speech.  On his initial physical examination blood pressure 126/64, heart rate 84, respiratory rate 16, temperature 97.8, oxygen saturation 99%, lungs are clear to auscultation bilaterally, heart S1-S2 present regular, soft abdomen, no lower extremity edema.  He had decreased strength bilateral lower extremities, but able to follow commands and respond to verbal stimuli. Sodium 138, potassium 3.4, chloride 94, bicarb 22, glucose 98, BUN 25, creatinine 1.32, CK 981, white count 10.2, hemoglobin 8.1, hematocrit 27.9, platelets 229.  SARS COVID-19 negative.  Urinalysis negative for infection, specific gravity 1.021.  Chest radiograph no infiltrates.  EKG 81 bpm, left axis deviation, normal intervals, atrial fibrillation, positive PVCs, no ST segment or T wave changes.  Head CT with cerebral atrophy, no acute changes.  Hips/legs films negative for fracture.  Further work-up with brain MRI showed a 5 mm right frontal centrum semiovale infarct, 1.5-2 cm lesion posterior right corpus callosum infarct.  Neurology  recommended possible work-up for brain metastasis related to prostate cancer.  Neurosurgery recommended nonsurgical approach, and follow-up with brain MRI after discharge.  Patient very weak and deconditioned, now pending placement at skilled nursing facility   Assessment & Plan:   Principal Problem:   Stroke [facility]) Active Problems:   Essential hypertension   Uncontrolled type 2 diabetes mellitus with diabetic polyneuropathy, without long-term current use of insulin (HCC)   PAF (paroxysmal atrial fibrillation) (HCC)   Atherosclerosis of native coronary artery of native heart with angina pectoris ([place])   Primary malignant neoplasm of prostate metastatic to bone [facility])   Generalized weakness   Fall at home, initial encounter   Acute on chronic anemia   Elevated CK   Chronic kidney disease, stage 3a   Elevated troponin level not due myocardial infarction   Pressure injury of skin   Protein-calorie malnutrition, severe   Palliative care by specialist   Goals of care, counseling/discussion   DNR (do not resuscitate) discussion   FTT (failure to thrive) in adult   Need for emotional support   1. Acute 5 mm right frontal centrum semiovale infarct. 1.5-2 cm posterior right corpus callosum infarct/ ambulatory and swallow dysfunction. Patient continue to be very weak and deconditioned. His mentation continue to improve.  Swallow dysfunction continue with dysphagia one diet and aspiration precautions.  On atorvastatin, and antithrombotic therapy with rivaroxaban.   Follow up brain MRI as outpatient to rule out possible metastatic bran disease.   2. CKD stage 3a with Hypokalemia, hypocalcemia, hypomagnesemia and hypophosphatemia. Stable renal function with serum cr at 0,73, K at 3,7 and serum bicarbonate at 25, Mg 2,2 Ca 7,6 (albumin 1,6).  His electrolytes have improved, continue close monitoring of renal function and electrolytes.   3. HTN On metoprolol for blood pressure  control.   4. Uncontrolled T2DM Hgb A1c 7,5/ dyslipidemia . Fasting glucose 172 this am, will continue with insulin sliding scale for glucose control and monitoring.   On statin therapy.   5. Paroxysmal atrial fibrillation/ elevated troponin.  No signs of acute coronary syndrome, rate control with metoprolol and antithrombotic therapy with rivaroxaban.   6. Acute hypoxemic respiratory failure. Transitory hypoxemia, continue supplemental 02 per [place], add incentive spirometer as tolerated.   Positive cough this am, will add antitussive agent scheduled with guaifenesin DM q 6 H.   7. Metastatic prostate cancer/ acute on chronic anemia/ iron deficiency/ severe calorie protein malnutrition.  Iron 13, tibc 238, transferrin saturation 5, sp one dose of IV Iron. Will target a Hgb close to 8 for symptomatic anemia. It will take some time the iron to impact hgb and hct, will transfuse one unit PRBC today.     Plan to transfer patient to SNF.   8. Stage 1 pressure ulcer coccyx, present on admission. Local wound care.     Status is: Inpatient  Remains inpatient appropriate because:Inpatient level of care appropriate due to severity of illness   Dispo:  Patient From: Home  Planned Disposition: [facility]  Expected discharge date: 05/09/20  Medically stable for discharge: No   DVT prophylaxis: Enoxaparin   Code Status:   full  Family Communication:  I spoke with patient's son at the bedside, we talked in detail about patient's condition, plan of care and prognosis and all questions were addressed.      Nutrition Status: Nutrition Problem: Severe Malnutrition Etiology: chronic illness (prostate cancer) Signs/Symptoms: moderate fat depletion, severe fat depletion, moderate muscle depletion, severe muscle depletion, percent weight loss Percent weight loss: 12.6 % Interventions: Ensure Enlive (each supplement provides 350kcal and 20 grams of protein), Magic cup, MVI,  Hormel Shake     Skin Documentation: Pressure Injury 05/03/20 Coccyx Stage 1 -  Intact skin with non-blanchable redness of a localized area usually over a bony prominence. pink (Active)  05/03/20 0908  Location: Coccyx  Location Orientation:   Staging: Stage 1 -  Intact skin with non-blanchable redness of a localized area usually over a bony prominence.  Wound Description (Comments): pink  Present on Admission:      Consultants:   Neurology    Subjective: Patient continue to be very weak and deconditioned, no nausea or vomiting, positive cough and having difficulty sleeping.   Objective: Vitals:   05/07/20 2200 05/08/20 0055 05/08/20 0600 05/08/20 0800  BP: 138/64 (!) 143/66 (!) 158/70 (!) 154/92  Pulse: 76 76 74   Resp: 19 17 16    Temp:  97.8 F (36.6 C) 98.2 F (36.8 C) 98.3 F (36.8 C)  TempSrc:  Oral Oral Oral  SpO2: 98%  97%   Weight:      Height:        Intake/Output Summary (Last 24 hours) at 05/08/2020 1436 Last data filed at 05/08/2020 1117 Gross per 24 hour  Intake 821.64 ml  Output 825 ml  Net -3.36 ml   Filed Weights   05/05/20 0410 05/06/20 0400 05/07/20 0300  Weight: 77.1 kg 70.8 kg 71.2 kg    Examination:   General: deconditioned  Neurology: Awake and alert, non focal  E ENT: positive pallor, no icterus, oral mucosa moist Cardiovascular: No JVD. S1-S2 present, rhythmic, no gallops, rubs, or murmurs.  No lower extremity edema. Pulmonary: vesicular breath sounds bilaterally, decreased breath sounds at bases Gastrointestinal. Abdomen soft and non tender Skin. No rashes Musculoskeletal: no joint deformities     Data Reviewed: I have personally reviewed following labs and imaging studies  CBC: Recent Labs  Lab 05/03/20 0402 05/03/20 0402 05/04/20 1224 05/05/20 0447 05/06/20 0505 05/07/20 0243 05/08/20 0620  WBC 9.9  --  6.6 6.2 5.2 5.7  --   HGB 8.4*   < > 7.5* 7.3* 7.1* 7.4* 7.1*  HCT 27.3*   < > 24.6* 24.4* 23.4* 24.4* 23.9*    MCV 77.6*  --  76.4* 76.7* 76.0* 76.0*  --   PLT 211  --  184 172 168 174  --    < > = values in this interval not displayed.   Basic Metabolic Panel: Recent Labs  Lab 05/04/20 1224 05/05/20 0447 05/06/20 0505 05/07/20 0243 05/08/20 0620  NA 139 141 141 140 139  K 3.1* 3.3* 3.5 3.2* 3.7  CL 108 111 109 107 107  CO2 22 21* 22 22 25   GLUCOSE 252* 128* 201* 167* 172*  BUN 22 20 15 12 12   CREATININE 0.86 0.90 0.81 0.82 0.73  CALCIUM 6.9* 7.0* 6.8* 7.0* 7.6*  MG  --  1.9 1.7 1.7 2.2  PHOS  --  1.3* 1.9* 1.9*  --    GFR: Estimated Creatinine Clearance: 67.7 mL/min (by C-G formula based on SCr of 0.73 mg/dL). Liver Function Tests: Recent Labs  Lab 05/02/20 0432 05/03/20 0402 05/04/20 1224 05/05/20 0447 05/07/20 0243  AST 51* 53* 62* 49* 29  ALT 19 20 18 18 17   ALKPHOS 185* 192* 250* 236* 180*  BILITOT 0.7 1.0 0.6 0.6 0.7  PROT 6.4* 6.6 6.1* 6.0* 6.0*  ALBUMIN 2.0* 1.9* 1.7* 1.7* 1.6*   No results for input(s): LIPASE, AMYLASE in the last 168 hours. No results for input(s): AMMONIA in the last 168 hours. Coagulation Profile: No results for input(s): INR, PROTIME in the last 168 hours. Cardiac Enzymes: No results for input(s): CKTOTAL, CKMB, CKMBINDEX, TROPONINI in the last 168 hours. BNP (last 3 results) No results for input(s): PROBNP in the last 8760 hours. HbA1C: No results for input(s): HGBA1C in the last 72 hours. CBG: Recent Labs  Lab 05/07/20 1135 05/07/20 1719 05/07/20 2051 05/08/20 0624 05/08/20 1230  GLUCAP 242* 145* 160* 167* 212*   Lipid Profile: No results for input(s): CHOL, HDL, LDLCALC, TRIG, CHOLHDL, LDLDIRECT in the last 72 hours. Thyroid Function Tests: No results for input(s): TSH, T4TOTAL, FREET4, T3FREE, THYROIDAB in the last 72 hours. Anemia Panel: No results for input(s): VITAMINB12, FOLATE, FERRITIN, TIBC, IRON, RETICCTPCT in the last 72 hours.    Radiology Studies: I have reviewed all of the imaging during this hospital visit  personally     Scheduled Meds: . atorvastatin  20 mg Oral Daily  . doxazosin  1 mg Oral QHS  . feeding supplement (ENSURE ENLIVE)  237 mL Oral TID BM  . insulin aspart  0-15 Units Subcutaneous TID AC & HS  . metoprolol tartrate  12.5 mg Oral BID  . multivitamin with minerals  1 tablet Oral Daily  . rivaroxaban  20 mg Oral Q supper   Continuous Infusions:   LOS: 6 days        Mykaylah Ballman Gerome Apley, MD

## 2020-05-09 DIAGNOSIS — R10811 Right upper quadrant abdominal tenderness: Secondary | ICD-10-CM

## 2020-05-09 LAB — TYPE AND SCREEN
ABO/RH(D): B NEG
Antibody Screen: NEGATIVE
Unit division: 0

## 2020-05-09 LAB — BPAM RBC
Blood Product Expiration Date: 202109272359
ISSUE DATE / TIME: 202108221643
Unit Type and Rh: 1700

## 2020-05-09 LAB — CBC WITH DIFFERENTIAL/PLATELET
Abs Immature Granulocytes: 0.26 10*3/uL — ABNORMAL HIGH (ref 0.00–0.07)
Basophils Absolute: 0.1 10*3/uL (ref 0.0–0.1)
Basophils Relative: 1 %
Eosinophils Absolute: 0 10*3/uL (ref 0.0–0.5)
Eosinophils Relative: 1 %
HCT: 30.3 % — ABNORMAL LOW (ref 39.0–52.0)
Hemoglobin: 9.4 g/dL — ABNORMAL LOW (ref 13.0–17.0)
Immature Granulocytes: 5 %
Lymphocytes Relative: 19 %
Lymphs Abs: 1.1 10*3/uL (ref 0.7–4.0)
MCH: 24.3 pg — ABNORMAL LOW (ref 26.0–34.0)
MCHC: 31 g/dL (ref 30.0–36.0)
MCV: 78.3 fL — ABNORMAL LOW (ref 80.0–100.0)
Monocytes Absolute: 0.5 10*3/uL (ref 0.1–1.0)
Monocytes Relative: 8 %
Neutro Abs: 3.8 10*3/uL (ref 1.7–7.7)
Neutrophils Relative %: 66 %
Platelets: 207 10*3/uL (ref 150–400)
RBC: 3.87 MIL/uL — ABNORMAL LOW (ref 4.22–5.81)
RDW: 19.5 % — ABNORMAL HIGH (ref 11.5–15.5)
WBC: 5.7 10*3/uL (ref 4.0–10.5)
nRBC: 0 % (ref 0.0–0.2)

## 2020-05-09 LAB — BASIC METABOLIC PANEL
Anion gap: 9 (ref 5–15)
BUN: 8 mg/dL (ref 8–23)
CO2: 26 mmol/L (ref 22–32)
Calcium: 7.8 mg/dL — ABNORMAL LOW (ref 8.9–10.3)
Chloride: 104 mmol/L (ref 98–111)
Creatinine, Ser: 0.73 mg/dL (ref 0.61–1.24)
GFR calc Af Amer: >60 mL/min (ref >60)
GFR calc non Af Amer: >60 mL/min (ref >60)
Glucose, Bld: 150 mg/dL — ABNORMAL HIGH (ref 70–99)
Potassium: 3.3 mmol/L — ABNORMAL LOW (ref 3.5–5.1)
Sodium: 139 mmol/L (ref 135–145)

## 2020-05-09 LAB — GLUCOSE, CAPILLARY
Glucose-Capillary: 145 mg/dL — ABNORMAL HIGH (ref 70–99)
Glucose-Capillary: 178 mg/dL — ABNORMAL HIGH (ref 70–99)
Glucose-Capillary: 146 mg/dL — ABNORMAL HIGH (ref 70–99)
Glucose-Capillary: 234 mg/dL — ABNORMAL HIGH (ref 70–99)

## 2020-05-09 MED ORDER — ALUM & MAG HYDROXIDE-SIMETH 200-200-20 MG/5ML PO SUSP
15.0000 mL | Freq: Four times a day (QID) | ORAL | Status: DC | PRN
  Administered 2020-05-09 – 2020-05-12 (×5): 15 mL via ORAL
  Filled 2020-05-09 (×5): qty 30

## 2020-05-09 MED ORDER — WHITE PETROLATUM GEL
Status: DC | PRN
  Administered 2020-05-09 – 2020-05-10 (×2): 0.2 via TOPICAL
  Filled 2020-05-09 (×3): qty 18

## 2020-05-09 MED ORDER — WHITE PETROLATUM EX OINT
TOPICAL_OINTMENT | CUTANEOUS | Status: DC | PRN
  Filled 2020-05-09: qty 5

## 2020-05-09 MED ORDER — POTASSIUM CHLORIDE CRYS ER 20 MEQ PO TBCR
40.0000 meq | EXTENDED_RELEASE_TABLET | Freq: Once | ORAL | Status: AC
  Administered 2020-05-09: 40 meq via ORAL
  Filled 2020-05-09: qty 2

## 2020-05-09 MED ORDER — SIMETHICONE 40 MG/0.6ML PO SUSP
40.0000 mg | Freq: Four times a day (QID) | ORAL | Status: DC | PRN
  Administered 2020-05-09 – 2020-05-11 (×3): 40 mg via ORAL
  Filled 2020-05-09 (×6): qty 0.6

## 2020-05-09 NOTE — Plan of Care (Signed)
  Problem: Education: Goal: Knowledge of General Education information will improve Description Including pain rating scale, medication(s)/side effects and non-pharmacologic comfort measures Outcome: Progressing   

## 2020-05-09 NOTE — NC FL2 (Signed)
[place] LEVEL OF CARE SCREENING TOOL     IDENTIFICATION  Patient Name: Adrian Singh Birthdate: September 02, 1934 Sex: male Admission Date (Current Location): 04/30/2020  [facility] and [place] Number:  Herbalist and Address:  [facility] [place], [place] [address], [place], [place] [address]      Provider Number: [id number]  Attending Physician Name and Address:  Oren Binet*  Relative Name and Phone Number:  Richardson Landry [phone]    Current Level of Care: Hospital Recommended Level of Care: [place] Prior Approval Number:    Date Approved/Denied:   PASRR Number: [id number]  Discharge Plan: SNF    Current Diagnoses: Patient Active Problem List   Diagnosis Date Noted  . Protein-calorie malnutrition, severe 05/06/2020  . Palliative care by specialist   . Goals of care, counseling/discussion   . DNR (do not resuscitate) discussion   . FTT (failure to thrive) in adult   . Need for emotional support   . Pressure injury of skin 05/04/2020  . Stroke ([place]) 05/02/2020  . Generalized weakness 04/30/2020  . Fall at home, initial encounter 04/30/2020  . Acute on chronic anemia 04/30/2020  . Elevated CK 04/30/2020  . Chronic kidney disease, stage 3a 04/30/2020  . Elevated troponin level not due myocardial infarction 04/30/2020  . Fatigue 04/29/2020  . Malnutrition of mild degree ([place]) 04/06/2019  . Diabetic foot ulcer ([place]) 04/06/2019  . Mood disorder ([place]) 04/06/2019  . Aortic atherosclerosis ([place]) 02/13/2017  . Prostate cancer ([place]) 02/13/2017  . Primary malignant neoplasm of prostate metastatic to bone ([place]) 02/13/2017  . Orthostatic hypotension 03/19/2016  . CAD S/P percutaneous coronary angioplasty 02/28/2016  . Atherosclerosis of native coronary artery of native heart with angina pectoris ([place])   . Abnormal nuclear stress test 09/22/2015  . Thrombocytopenia ([place])   . Advanced directives,  counseling/discussion 09/27/2014  . PAF (paroxysmal atrial fibrillation) ([place]) 04/12/2014  . History of CVA (cerebrovascular accident) 03/31/2014  . CKD stage 3 due to type 2 diabetes mellitus ([place])   . Uncontrolled type 2 diabetes mellitus with diabetic polyneuropathy, without long-term current use of insulin ([place]) 11/05/2011  . Routine general medical examination at a health care facility 05/07/2011  . Essential hypertension 12/23/2009  . GERD 12/23/2009  . BPH (benign prostatic hyperplasia) 12/23/2009  . OSTEOARTHRITIS 12/23/2009    Orientation RESPIRATION BLADDER Height & Weight     Self, Time, Situation, Place  O2 (Nasal canula 2 liters) Continent, External catheter Weight: 162 lb (73.5 kg) Height:  5\' 8"  (172.7 cm)  BEHAVIORAL SYMPTOMS/MOOD NEUROLOGICAL BOWEL NUTRITION STATUS  Dangerous to self, others or property   Continent Diet (see dc summary)  AMBULATORY STATUS COMMUNICATION OF NEEDS Skin     Verbally Other (Comment) (Pressure Injury 05/03/20 coccyx stage 1 intact skin)                       Personal Care Assistance Level of Assistance              Functional Limitations Info  Sight Sight Info: Impaired        SPECIAL CARE FACTORS FREQUENCY  PT (By licensed PT), OT (By licensed OT)     PT Frequency: 5X week OT Frequency: 5X week            Contractures Contractures Info: Not present    Additional Factors Info  Code Status, Allergies, Insulin Sliding Scale Code Status Info: Full Allergies Info:  Soma (Carisoprodol), Doxazosin   Insulin Sliding Scale Info: insulin aspart (novoLOG) injection 0-15 Units three times a day before meals and bedtime       Current Medications (05/09/2020):  This is the current hospital active medication list Current Facility-Administered Medications  Medication Dose Route Frequency Provider Last Rate Last Admin  . 0.9 %  sodium chloride infusion (Manually program via Guardrails IV Fluids)   Intravenous Once Arrien,  Jimmy Picket, MD      . acetaminophen (TYLENOL) tablet 650 mg  650 mg Oral Q6H PRN Shalhoub, Sherryll Burger, MD       Or  . acetaminophen (TYLENOL) suppository 650 mg  650 mg Rectal Q6H PRN Vernelle Emerald, MD   650 mg at 05/05/20 1714  . albuterol (PROVENTIL) (2.5 MG/3ML) 0.083% nebulizer solution 2.5 mg  2.5 mg Nebulization Q6H PRN Pokhrel, Laxman, MD   2.5 mg at 05/07/20 0645  . atorvastatin (LIPITOR) tablet 20 mg  20 mg Oral Daily Shalhoub, Sherryll Burger, MD   20 mg at 05/09/20 0848  . doxazosin (CARDURA) tablet 1 mg  1 mg Oral QHS Donne Hazel, MD   1 mg at 05/08/20 2138  . feeding supplement (ENSURE ENLIVE) (ENSURE ENLIVE) liquid 237 mL  237 mL Oral TID BM Pokhrel, Laxman, MD   237 mL at 05/09/20 0907  . guaiFENesin-dextromethorphan (ROBITUSSIN DM) 100-10 MG/5ML syrup 5 mL  5 mL Oral Q6H Arrien, Jimmy Picket, MD   5 mL at 05/09/20 1220  . insulin aspart (novoLOG) injection 0-15 Units  0-15 Units Subcutaneous TID AC & HS Pokhrel, Laxman, MD   3 Units at 05/09/20 1219  . losartan (COZAAR) tablet 50 mg  50 mg Oral Daily Chotiner, Yevonne Aline, MD   50 mg at 05/09/20 0902  . magic mouthwash w/lidocaine  10 mL Oral QID PRN Donne Hazel, MD   10 mL at 05/08/20 2333  . metoprolol tartrate (LOPRESSOR) 25 mg/10 mL oral suspension 12.5 mg  12.5 mg Oral BID Donne Hazel, MD   12.5 mg at 05/09/20 6761  . morphine 2 MG/ML injection 2 mg  2 mg Intravenous Q4H PRN Donne Hazel, MD   2 mg at 05/06/20 0345  . multivitamin with minerals tablet 1 tablet  1 tablet Oral Daily Donne Hazel, MD   1 tablet at 05/09/20 [phone]  . nitroGLYCERIN (NITROSTAT) SL tablet 0.4 mg  0.4 mg Sublingual Q5 min PRN Shalhoub, Sherryll Burger, MD      . ondansetron [facility]) tablet 4 mg  4 mg Oral Q6H PRN Shalhoub, Sherryll Burger, MD       Or  . ondansetron [name]) injection 4 mg  4 mg Intravenous Q6H PRN Shalhoub, Sherryll Burger, MD      . phenol (CHLORASEPTIC) mouth spray 1 spray  1 spray Mouth/Throat PRN Donne Hazel, MD   1 spray at  05/06/20 1505  . polyethylene glycol (MIRALAX / GLYCOLAX) packet 17 g  17 g Oral Daily PRN Shalhoub, Sherryll Burger, MD      . Resource ThickenUp Clear   Oral PRN Donne Hazel, MD      . rivaroxaban [name]) tablet 20 mg  20 mg Oral Q supper Wendee Beavers, RPH   20 mg at 05/08/20 1629  . traMADol (ULTRAM) tablet 50 mg  50 mg Oral Q6H PRN Vernelle Emerald, MD   50 mg at 05/08/20 2021   Or  . traMADol (ULTRAM) tablet 100 mg  100 mg Oral Q6H  PRN Vernelle Emerald, MD   100 mg at 05/04/20 1841  . white petrolatum (VASELINE) gel   Topical PRN Vashti Hey, MD         Discharge Medications: Please see discharge summary for a list of discharge medications.  Relevant Imaging Results:  Relevant Lab Results:   Additional Information [facility] [place] Johnnie Moten, LCSWA

## 2020-05-09 NOTE — TOC Initial Note (Signed)
Transition of Care [facility]) - Initial/Assessment Note    Patient Details  Name: Adrian Singh MRN: [id number] Date of Birth: [birth year]  Transition of [facility]) CM/SW Contact:    Loreta [address], [place] Phone Number: 05/09/2020, 12:18 PM  Clinical Narrative:                  8/23- CSW received consult for possible SNF placement at time of discharge. CSW spoke with patient and his son Richardson Landry regarding PT recommendation of SNF placement at time of discharge. Patient reported that patient's spouse is currently unable to care for patient at their home given patient's current physical needs and fall risk. Patient expressed understanding of PT recommendation and is agreeable to SNF placement at time of discharge. Patient reports preference for [facility] in [place]. CSW discussed insurance authorization process and will provide Medicare SNF ratings list. Patient expressed being hopeful for rehab and to feel better soon. Patient has received both Covid vaccines, Richardson Landry stated one of his daughters will provide a copy of the card. No further questions reported at this time. CSW to continue to follow and assist with discharge planning needs.  Expected Discharge Plan: Acute to Acute Transfer Barriers to Discharge: Continued Medical Work up, Ship broker   Patient Goals and CMS Choice Patient states their goals for this hospitalization and ongoing recovery are:: To get better CMS Medicare.gov Compare Post Acute Care list provided to:: Patient Choice offered to / list presented to : Patient, Adult Children (Son Richardson Landry)  Expected Discharge Plan and Services Expected Discharge Plan: Acute to Acute Transfer In-house Referral: Clinical Social Work   Post Acute Care Choice: [place] Living arrangements for the past 2 months: [place]                                      Prior Living Arrangements/Services Living arrangements for the past 2 months: Single  Family Home Lives with:: Spouse Patient language and need for interpreter reviewed:: Yes Do you feel safe going back to the place where you live?: No   Family can't take care of  Need for Family Participation in Patient Care: Yes (Comment) Care giver support system in place?: No (comment)   Criminal Activity/Legal Involvement Pertinent to Current Situation/Hospitalization: No - Comment as needed  Activities of Daily Living Home Assistive Devices/Equipment: Shower chair with back, CBG Meter, Cane (specify quad or straight) ADL Screening (condition at time of admission) Patient's cognitive ability adequate to safely complete daily activities?: No Is the patient deaf or have difficulty hearing?: Yes Does the patient have difficulty seeing, even when wearing glasses/contacts?: Yes Does the patient have difficulty concentrating, remembering, or making decisions?: Yes Patient able to express need for assistance with ADLs?: Yes Does the patient have difficulty dressing or bathing?: Yes Independently performs ADLs?: No Communication: Independent Dressing (OT): Needs assistance Is this a change from baseline?: Pre-admission baseline Grooming: Independent Feeding: Independent Bathing: Independent Toileting: Dependent, Needs assistance Is this a change from baseline?: Pre-admission baseline In/Out Bed: Dependent, Needs assistance Is this a change from baseline?: Pre-admission baseline Walks in Home: Dependent, Independent with device (comment) Is this a change from baseline?: Pre-admission baseline Does the patient have difficulty walking or climbing stairs?: Yes Weakness of Legs: Left Weakness of Arms/Hands: None  Permission Sought/Granted Permission sought to share information with : Facility Sport and exercise psychologist, Family Supports Permission granted to share information  with : Yes, Verbal Permission Granted  Share Information with NAME: Richardson Landry  Permission granted to share info w AGENCY:  SNF  Permission granted to share info w Relationship: Son  Permission granted to share info w Contact Information: [phone]  Emotional Assessment Appearance:: Appears stated age Attitude/Demeanor/Rapport: Gracious Affect (typically observed): Calm Orientation: : Oriented to Self, Oriented to Place, Oriented to  Time, Oriented to Situation Alcohol / Substance Use: Not Applicable Psych Involvement: No (comment)  Admission diagnosis:  Pneumonia [J18.9] Generalized weakness [R53.1] AKI (acute kidney injury) ([place]) [N17.9] Traumatic rhabdomyolysis, initial encounter ([place]) [T79.6XXA] Stroke [facility]) [I63.9] Patient Active Problem List   Diagnosis Date Noted  . Protein-calorie malnutrition, severe 05/06/2020  . Palliative care by specialist   . Goals of care, counseling/discussion   . DNR (do not resuscitate) discussion   . FTT (failure to thrive) in adult   . Need for emotional support   . Pressure injury of skin 05/04/2020  . Stroke ([place]) 05/02/2020  . Generalized weakness 04/30/2020  . Fall at home, initial encounter 04/30/2020  . Acute on chronic anemia 04/30/2020  . Elevated CK 04/30/2020  . Chronic kidney disease, stage 3a 04/30/2020  . Elevated troponin level not due myocardial infarction 04/30/2020  . Fatigue 04/29/2020  . Malnutrition of mild degree ([place]) 04/06/2019  . Diabetic foot ulcer ([place]) 04/06/2019  . Mood disorder ([place]) 04/06/2019  . Aortic atherosclerosis ([place]) 02/13/2017  . Prostate cancer ([place]) 02/13/2017  . Primary malignant neoplasm of prostate metastatic to bone ([place]) 02/13/2017  . Orthostatic hypotension 03/19/2016  . CAD S/P percutaneous coronary angioplasty 02/28/2016  . Atherosclerosis of native coronary artery of native heart with angina pectoris ([place])   . Abnormal nuclear stress test 09/22/2015  . Thrombocytopenia ([place])   . Advanced directives, counseling/discussion 09/27/2014  . PAF (paroxysmal atrial fibrillation) ([place]) 04/12/2014  . History of  CVA (cerebrovascular accident) 03/31/2014  . CKD stage 3 due to type 2 diabetes mellitus ([place])   . Uncontrolled type 2 diabetes mellitus with diabetic polyneuropathy, without long-term current use of insulin ([place]) 11/05/2011  . Routine general medical examination at a health care facility 05/07/2011  . Essential hypertension 12/23/2009  . GERD 12/23/2009  . BPH (benign prostatic hyperplasia) 12/23/2009  . OSTEOARTHRITIS 12/23/2009   PCP:  [name], MD Pharmacy:   [place], [place] [address] [place] [address] Phone: [phone] Fax: [phone]  [place], [place] - [address] [place] [address] Phone: [phone] Fax: [phone]  [facility] [id number], [place] AT [facility] [place] [address] [place] [address] Phone: [phone] Fax: [phone]  [place], [place] - [place] [address] Phone: [phone] Fax: [phone]  [place], [place] [address] [place] [place] [address] Phone: [phone] Fax: [phone]     Social Determinants of Health (SDOH) Interventions    Readmission Risk Interventions No flowsheet data found.

## 2020-05-09 NOTE — Progress Notes (Signed)
   05/09/20 1202  Clinical Encounter Type  Visited With Patient and family together  Visit Type Initial;Other (Comment) (HCPOA)  Referral From Palliative care team  Consult/Referral To Chaplain  Spiritual Encounters  Spiritual Needs Prayer  This chaplain responded to PMT consult for documentation for HCPOA and spiritual care.  The Pt. is awake and conversational celebrating he can lift both legs today. The Pt. son-Steve is bedside.  The Pt. daughter-[name] will be present on Tuesday. The chaplain understands from the Pt., his three children and wife will make healthcare decisions as a group for him if he can not make them. The Pt. articulated he is confident the family knows the Pt. wishes.   The chaplain will not pursue documenting HCPOA today. The Pt. is trusting God's presence on his daily journey. The chaplain communicated, if needed a page from the RN will invite the chaplain to return.

## 2020-05-09 NOTE — Progress Notes (Signed)
PROGRESS NOTE    Adrian Singh  KYH:[id number]  DOB: [birth year]  DOA: 04/30/2020 PCP: [name], MD Outpatient Specialists:   Hospital course:  86 year old man with known PAF, CAD, HFpEF, DM 2, HTN was admitted 04/30/2020 acute stroke s/p fall found to be secondary to left hemiparesis and slurred speech.  Work-up revealed 5 mm right frontal infarct as well as a a masslike lesion in the corpus callosum.  Neurology was not entirely clear what this was possibly stroke possibly metastatic lesion from prostate cancer.  Patient was seen by neurosurgery who recommended watchful waiting and reimaging in 1 to 2 months.  As per neurology note: "If this does represent stroke, I would expect slow gradual improvement, and therefore more aggressive supportive therapy may be indicated unless he has a poor prognosis from his other metastatic disease. If this represents CNS metastasis, then I would expect gradual worsening as opposed to improvement"   Subjective:  Sitting up in bed chatting with his son who is attempting to feed him.  He is coherent and logical.  States he is awaiting placement in rehab which he is willing to go to.  He would like to get stronger.  He has no complaints.  Notes he is trying to eat but is only able to eat about 50% of his food.   Objective: Vitals:   05/09/20 0327 05/09/20 1202 05/09/20 1400 05/09/20 1440  BP:   (!) 175/83 (!) 168/76  Pulse:   72   Resp:   17   Temp:  99 F (37.2 C)    TempSrc:  Oral    SpO2:      Weight: 73.5 kg     Height:        Intake/Output Summary (Last 24 hours) at 05/09/2020 1708 Last data filed at 05/09/2020 1200 Gross per 24 hour  Intake 792 ml  Output 1300 ml  Net -508 ml   Filed Weights   05/06/20 0400 05/07/20 0300 05/09/20 0327  Weight: 70.8 kg 71.2 kg 73.5 kg     Exam:  General: Somewhat frail elderly man sitting up in bed trying to eat with his attentive son at bedside feeding him. Eyes: sclera anicteric,  conjuctiva mild injection bilaterally CVS: S1-S2, regular  Respiratory:  decreased air entry bilaterally secondary to decreased inspiratory effort, rales at bases  GI: NABS, soft, NT  LE: No edema.  Psych: patient is logical and coherent, judgement and insight appear normal, mood and affect appropriate to situation.   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 No further recommendations per neurology  Hypokalemia We will replete and recheck  Intermittent hypoxia Unclear etiology, treated with as needed oxygen Resolved spontaneously Incentive spirometer given  PAF Rate is controlled on metoprolol Anticoagulated with rivaroxaban  HTN Stably controlled on metoprolol  DM2 Under reasonable control on SSI AC at bedtime  CKD 3A Creatinine is stable  Dysphagia with protein calorie malnutrition Continue dysphagia 1 diet with aspiration precaution  Anemia Patient received 1 dose of IV iron Follow H&H, goal is for hemoglobin post 8  Metastatic prostate cancer Patient treatment as previously instituted   DVT prophylaxis: Xarelto Code Status: Full Family Communication: Spoke with patient and his son  at bedside Disposition Plan:   Patient is from: Home  Anticipated Discharge Location: SNF  Barriers to Discharge: Awaiting SNF bed  Is patient medically stable for Discharge: Yes   Consultants: Neurology Palliative care Neurosurgery  Antimicrobials:  None   Data Reviewed:  Basic Metabolic Panel: Recent Labs  Lab 05/05/20 0447 05/06/20 0505 05/07/20 0243 05/08/20 0620 05/09/20 0705  NA 141 141 140 139 139  K 3.3* 3.5 3.2* 3.7 3.3*  CL 111 109 107 107 104  CO2  21* 22 22 25 26   GLUCOSE 128* 201* 167* 172* 150*  BUN 20 15 12 12 8   CREATININE 0.90 0.81 0.82 0.73 0.73  CALCIUM 7.0* 6.8* 7.0* 7.6* 7.8*  MG 1.9 1.7 1.7 2.2  --   PHOS 1.3* 1.9* 1.9*  --   --    Liver Function Tests: Recent Labs  Lab 05/03/20 0402 05/04/20 1224 05/05/20 0447 05/07/20 0243  AST 53* 62* 49* 29  ALT 20 18 18 17   ALKPHOS 192* 250* 236* 180*  BILITOT 1.0 0.6 0.6 0.7  PROT 6.6 6.1* 6.0* 6.0*  ALBUMIN 1.9* 1.7* 1.7* 1.6*   No results for input(s): LIPASE, AMYLASE in the last 168 hours. No results for input(s): AMMONIA in the last 168 hours. CBC: Recent Labs  Lab 05/04/20 1224 05/04/20 1224 05/05/20 0447 05/05/20 0447 05/06/20 0505 05/07/20 0243 05/08/20 0620 05/08/20 2156 05/09/20 0705  WBC 6.6  --  6.2  --  5.2 5.7  --   --  5.7  NEUTROABS  --   --   --   --   --   --   --   --  3.8  HGB 7.5*   < > 7.3*   < > 7.1* 7.4* 7.1* 9.0* 9.4*  HCT 24.6*   < > 24.4*   < > 23.4* 24.4* 23.9* 28.8* 30.3*  MCV 76.4*  --  76.7*  --  76.0* 76.0*  --   --  78.3*  PLT 184  --  172  --  168 174  --   --  207   < > = values in this interval not displayed.   Cardiac Enzymes: No results for input(s): CKTOTAL, CKMB, CKMBINDEX, TROPONINI in the last 168 hours. BNP (last 3 results) No results for input(s): PROBNP in the last 8760 hours. CBG: Recent Labs  Lab 05/08/20 1621 05/08/20 2127 05/09/20 0623 05/09/20 1205 05/09/20 1611  GLUCAP 147* 104* 145* 178* 146*    Recent Results (from the past 240 hour(s))  SARS Coronavirus 2 by RT PCR (hospital order, performed in [facility] hospital lab) Nasopharyngeal Nasopharyngeal Swab     Status: None   Collection Time: 04/30/20  7:30 PM   Specimen: Nasopharyngeal Swab  Result Value Ref Range Status   SARS Coronavirus 2 NEGATIVE NEGATIVE Final    Comment: (NOTE) SARS-CoV-2 target nucleic acids are NOT DETECTED.  The SARS-CoV-2 RNA is generally detectable in upper and lower respiratory specimens during the acute phase of  infection. The lowest concentration of SARS-CoV-2 viral copies this assay can detect is 250 copies / mL. A negative result does not preclude SARS-CoV-2 infection and should not be used as the sole basis for treatment or other patient management decisions.  A negative result may occur with improper specimen collection / handling, submission of specimen other than nasopharyngeal swab, presence of viral mutation(s) within the areas targeted by this assay, and inadequate number of viral copies (<250 copies / mL). A negative result must be combined  with clinical observations, patient history, and epidemiological information.  Fact Sheet for Patients:   StrictlyIdeas.no  Fact Sheet for Healthcare Providers: BankingDealers.co.za  This test is not yet approved or  cleared by the [place] FDA and has been authorized for detection and/or diagnosis of SARS-CoV-2 by FDA under an Emergency Use Authorization (EUA).  This EUA will remain in effect (meaning this test can be used) for the duration of the COVID-19 declaration under Section 564(b)(1) of the Act, 21 U.S.C. section 360bbb-3(b)(1), unless the authorization is terminated or revoked sooner.  Performed at [facility], [place] [address] [place], [place] [address]   Urine Culture     Status: Abnormal   Collection Time: 04/30/20  7:30 PM   Specimen: Urine, Random  Result Value Ref Range Status   Specimen Description URINE, RANDOM  Final   Special Requests NONE  Final   Culture (A)  Final    <10,000 COLONIES/mL INSIGNIFICANT GROWTH Performed at [facility], [place] [address] [place], [place] [address]    Report Status 05/01/2020 FINAL  Final      Studies: No results found.   Scheduled Meds: . sodium chloride   Intravenous Once  . atorvastatin  20 mg Oral Daily  . doxazosin  1 mg Oral QHS  . feeding supplement (ENSURE ENLIVE)  237 mL Oral TID BM  .  guaiFENesin-dextromethorphan  5 mL Oral Q6H  . insulin aspart  0-15 Units Subcutaneous TID AC & HS  . losartan  50 mg Oral Daily  . metoprolol tartrate  12.5 mg Oral BID  . multivitamin with minerals  1 tablet Oral Daily  . rivaroxaban  20 mg Oral Q supper   Continuous Infusions:  Principal Problem:   Stroke [facility]) Active Problems:   Essential hypertension   Uncontrolled type 2 diabetes mellitus with diabetic polyneuropathy, without long-term current use of insulin (HCC)   PAF (paroxysmal atrial fibrillation) (HCC)   Atherosclerosis of native coronary artery of native heart with angina pectoris ([place])   Primary malignant neoplasm of prostate metastatic to bone [facility])   Generalized weakness   Fall at home, initial encounter   Acute on chronic anemia   Elevated CK   Chronic kidney disease, stage 3a   Elevated troponin level not due myocardial infarction   Pressure injury of skin   Protein-calorie malnutrition, severe   Palliative care by specialist   Goals of care, counseling/discussion   DNR (do not resuscitate) discussion   FTT (failure to thrive) in adult   Need for emotional support     Vashti Hey, [facility]  If 7PM-7AM, please contact night-coverage [email or web address] Password [email or web address] 05/09/2020, 5:08 PM    LOS: 7 days

## 2020-05-09 NOTE — TOC Progression Note (Signed)
Transition of Care [facility]) - Progression Note    Patient Details  Name: Adrian Singh MRN: [id number] Date of Birth: 1934-07-13  Transition of Care [facility]) CM/SW [place], [place] Phone Number: 05/09/2020, 2:57 PM  Clinical Narrative:    CSW received phone call from Andrea @[facility] stating they are not in network with [facility]. CSW will expand SNF search.    Expected Discharge Plan: Acute to Acute Transfer Barriers to Discharge: Continued Medical Work up, [employer]  Expected Discharge Plan and Services Expected Discharge Plan: Acute to Acute Transfer In-house Referral: Clinical Social Work   Post Acute Care Choice: [place] Living arrangements for the past 2 months: Single Family Home                                       Social Determinants of Health (SDOH) Interventions    Readmission Risk Interventions No flowsheet data found.

## 2020-05-10 ENCOUNTER — Inpatient Hospital Stay (HOSPITAL_COMMUNITY): Payer: Medicare Other

## 2020-05-10 LAB — GLUCOSE, CAPILLARY
Glucose-Capillary: 237 mg/dL — ABNORMAL HIGH (ref 70–99)
Glucose-Capillary: 199 mg/dL — ABNORMAL HIGH (ref 70–99)
Glucose-Capillary: 245 mg/dL — ABNORMAL HIGH (ref 70–99)
Glucose-Capillary: 181 mg/dL — ABNORMAL HIGH (ref 70–99)

## 2020-05-10 LAB — BASIC METABOLIC PANEL
Anion gap: 11 (ref 5–15)
BUN: 8 mg/dL (ref 8–23)
CO2: 30 mmol/L (ref 22–32)
Calcium: 8.6 mg/dL — ABNORMAL LOW (ref 8.9–10.3)
Chloride: 97 mmol/L — ABNORMAL LOW (ref 98–111)
Creatinine, Ser: 0.71 mg/dL (ref 0.61–1.24)
GFR calc Af Amer: >60 mL/min (ref >60)
GFR calc non Af Amer: >60 mL/min (ref >60)
Glucose, Bld: 232 mg/dL — ABNORMAL HIGH (ref 70–99)
Potassium: 3.6 mmol/L (ref 3.5–5.1)
Sodium: 138 mmol/L (ref 135–145)

## 2020-05-10 LAB — HEPATIC FUNCTION PANEL
ALT: 17 U/L (ref 0–44)
AST: 28 U/L (ref 15–41)
Albumin: 1.9 g/dL — ABNORMAL LOW (ref 3.5–5.0)
Alkaline Phosphatase: 164 U/L — ABNORMAL HIGH (ref 38–126)
Bilirubin, Direct: 0.2 mg/dL (ref 0.0–0.2)
Indirect Bilirubin: 0.5 mg/dL (ref 0.3–0.9)
Total Bilirubin: 0.7 mg/dL (ref 0.3–1.2)
Total Protein: 7.2 g/dL (ref 6.5–8.1)

## 2020-05-10 LAB — CBC
HCT: 33.7 % — ABNORMAL LOW (ref 39.0–52.0)
Hemoglobin: 10.3 g/dL — ABNORMAL LOW (ref 13.0–17.0)
MCH: 23.7 pg — ABNORMAL LOW (ref 26.0–34.0)
MCHC: 30.6 g/dL (ref 30.0–36.0)
MCV: 77.5 fL — ABNORMAL LOW (ref 80.0–100.0)
Platelets: 239 10*3/uL (ref 150–400)
RBC: 4.35 MIL/uL (ref 4.22–5.81)
RDW: 19.9 % — ABNORMAL HIGH (ref 11.5–15.5)
WBC: 12.5 10*3/uL — ABNORMAL HIGH (ref 4.0–10.5)
nRBC: 0 % (ref 0.0–0.2)

## 2020-05-10 LAB — MAGNESIUM: Magnesium: 1.7 mg/dL (ref 1.7–2.4)

## 2020-05-10 MED ORDER — PANTOPRAZOLE SODIUM 40 MG PO TBEC
40.0000 mg | DELAYED_RELEASE_TABLET | Freq: Every day | ORAL | Status: DC
  Administered 2020-05-10 – 2020-05-12 (×3): 40 mg via ORAL
  Filled 2020-05-10 (×3): qty 1

## 2020-05-10 MED ORDER — BLISTEX MEDICATED EX OINT
TOPICAL_OINTMENT | CUTANEOUS | Status: DC | PRN
  Filled 2020-05-10: qty 6.3

## 2020-05-10 MED ORDER — MAGNESIUM SULFATE 2 GM/50ML IV SOLN
2.0000 g | Freq: Once | INTRAVENOUS | Status: AC
  Administered 2020-05-10: 2 g via INTRAVENOUS
  Filled 2020-05-10: qty 50

## 2020-05-10 NOTE — Progress Notes (Signed)
PROGRESS NOTE    Adrian Singh  IWL:[id number]  DOB: 21-Apr-1934  DOA: 04/30/2020 PCP: [name], MD Outpatient Specialists:   Hospital course:  86 year old man with known PAF, CAD, HFpEF, DM 2, HTN was admitted 04/30/2020 acute stroke s/p fall found to be secondary to left hemiparesis and slurred speech.  Work-up revealed 5 mm right frontal infarct as well as a a masslike lesion in the corpus callosum.  Neurology was not entirely clear what this was possibly stroke possibly metastatic lesion from prostate cancer.  Patient was seen by neurosurgery who recommended watchful waiting and reimaging in 1 to 2 months.  As per neurology note: "If this does represent stroke, I would expect slow gradual improvement, and therefore more aggressive supportive therapy may be indicated unless he has a poor prognosis from his other metastatic disease. If this represents CNS metastasis, then I would expect gradual worsening as opposed to improvement"   Subjective:  Patient states that he had a "rough night" last night.  Noted that he had abdominal pain that was only minimally relieved with Mylanta and simethicone.  Notes the pain came and went and sometimes woke him up from sleep.  No nausea or vomiting.    Objective: Vitals:   05/10/20 1200 05/10/20 1300 05/10/20 1400 05/10/20 1500  BP: 125/69 (!) 158/76 (!) 149/75 140/69  Pulse: 87 88 76 71  Resp: 18 (!) _0 Temp:      TempSrc:      SpO2: 98% 96% 99% 98%  Weight:      Height:        Intake/Output Summary (Last 24 hours) at 05/10/2020 1813 Last data filed at 05/10/2020 0600 Gross per 24 hour  Intake 120 ml  Output 1625 ml  Net -1505 ml   Filed Weights   05/07/20 0300 05/09/20 0327 05/10/20 0105  Weight: 71.2 kg 73.5 kg 73.5 kg     Exam:  General: Lying in bed sleeping with attentive daughter at bedside.  Patient was arousable by touch and was somewhat groggy but coherent and giving his history to me. Eyes: sclera  anicteric, conjuctiva mild injection bilaterally CVS: S1-S2, regular  Respiratory:  decreased air entry bilaterally secondary to decreased inspiratory effort, rales at bases  GI: Patient does have normoactive bowel sounds.  His abdomen is soft.  He does have some tenderness to light and deep palpation in the epigastric and right upper quadrant area.  No rebound tenderness.  Murphy sign is negative.   LE: No edema.  Psych: patient is logical and coherent, judgement and insight appear normal, mood and affect appropriate to situation.   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 No further recommendations per neurology  Intermittent hypoxia Unclear etiology, treated with as needed oxygen Resolved spontaneously Incentive spirometer given  PAF Rate is controlled on metoprolol Anticoagulated with rivaroxaban  HTN Stably controlled on metoprolol  DM2 Under reasonable control on SSI AC at bedtime  CKD 3A Creatinine is stable  Dysphagia with protein calorie malnutrition  Continue dysphagia 1 diet with aspiration precaution  Anemia Patient received 1 dose of IV iron Follow H&H, goal is for hemoglobin post 8  Metastatic prostate cancer Patient treatment as previously instituted   DVT prophylaxis: Xarelto Code Status: Full Family Communication: Spoke with patient and his son at bedside Disposition Plan:   Patient is from: Home  Anticipated Discharge Location: SNF   Barriers to Discharge: New onset right upper quadrant tenderness  Is patient medically stable for Discharge: No   Consultants: Neurology Palliative care Neurosurgery  Antimicrobials:  None   Data Reviewed:  Basic Metabolic Panel: Recent Labs  Lab 05/05/20 0447 05/05/20 0447 05/06/20 0505 05/07/20 0243 05/08/20 0620 05/09/20 0705 05/10/20 0544  NA 141   < > 141 140 139 139 138  K 3.3*   < > 3.5 3.2* 3.7 3.3* 3.6  CL 111   < > 109 107 107 104 97*  CO2 21*   < > _0 GLUCOSE 128*   < > 201* 167* 172* 150* 232*  BUN 20   < > _1 CREATININE 0.90   < > 0.81 0.82 0.73 0.73 0.71  CALCIUM 7.0*   < > 6.8* 7.0* 7.6* 7.8* 8.6*  MG 1.9  --  1.7 1.7 2.2  --  1.7  PHOS 1.3*  --  1.9* 1.9*  --   --   --    < > = values in this interval not displayed.   Liver Function Tests: Recent Labs  Lab 05/04/20 1224 05/05/20 0447 05/07/20 0243 05/10/20 0544  AST 62* 49* 29 28  ALT _2 ALKPHOS 250* 236* 180* 164*  BILITOT 0.6 0.6 0.7 0.7  PROT 6.1* 6.0* 6.0* 7.2  ALBUMIN 1.7* 1.7* 1.6* 1.9*   No results for input(s): LIPASE, AMYLASE in the last 168 hours. No results for input(s): AMMONIA in the last 168 hours. CBC: Recent Labs  Lab 05/05/20 0447 05/05/20 0447 05/06/20 0505 05/06/20 0505 05/07/20 0243 05/08/20 0620 05/08/20 2156 05/09/20 0705 05/10/20 0544  WBC 6.2  --  5.2  --  5.7  --   --  5.7 12.5*  NEUTROABS  --   --   --   --   --   --   --  3.8  --   HGB 7.3*   < > 7.1*   < > 7.4* 7.1* 9.0* 9.4* 10.3*  HCT 24.4*   < > 23.4*   < > 24.4* 23.9* 28.8* 30.3* 33.7*  MCV 76.7*  --  76.0*  --  76.0*  --   --  78.3* 77.5*  PLT 172  --  168  --  174  --   --  207 239   < > = values in this interval not displayed.   Cardiac Enzymes: No results for input(s): CKTOTAL, CKMB, CKMBINDEX, TROPONINI in the last 168 hours. BNP (last 3 results) No results for input(s): PROBNP in the last 8760 hours. CBG: Recent Labs  Lab 05/09/20 1611 05/09/20 2104  05/10/20 0618 05/10/20 1115 05/10/20 1612  GLUCAP 146* 234* 237* 199* 245*    Recent Results (from the past 240 hour(s))  SARS Coronavirus 2 by RT PCR (hospital order, performed in [facility] hospital lab) Nasopharyngeal Nasopharyngeal Swab     Status: None   Collection Time: 04/30/20  7:30 PM   Specimen: Nasopharyngeal Swab  Result Value Ref Range Status   SARS Coronavirus 2 NEGATIVE NEGATIVE Final  Comment: (NOTE) SARS-CoV-2 target nucleic acids are NOT DETECTED.  The SARS-CoV-2 RNA is generally detectable in upper and lower respiratory specimens during the acute phase of infection. The lowest concentration of SARS-CoV-2 viral copies this assay can detect is 250 copies / mL. A negative result does not preclude SARS-CoV-2 infection and should not be used as the sole basis for treatment or other patient management decisions.  A negative result may occur with improper specimen collection / handling, submission of specimen other than nasopharyngeal swab, presence of viral mutation(s) within the areas targeted by this assay, and inadequate number of viral copies (<250 copies / mL). A negative result must be combined with clinical observations, patient history, and epidemiological information.  Fact Sheet for Patients:   StrictlyIdeas.no  Fact Sheet for Healthcare Providers: BankingDealers.co.za  This test is not yet approved or  cleared by the [place] FDA and has been authorized for detection and/or diagnosis of SARS-CoV-2 by FDA under an Emergency Use Authorization (EUA).  This EUA will remain in effect (meaning this test can be used) for the duration of the COVID-19 declaration under Section 564(b)(1) of the Act, 21 U.S.C. section 360bbb-3(b)(1), unless the authorization is terminated or revoked sooner.  Performed at [facility], [place] [address], [place], [place] [address]   Urine Culture     Status: Abnormal    Collection Time: 04/30/20  7:30 PM   Specimen: Urine, Random  Result Value Ref Range Status   Specimen Description URINE, RANDOM  Final   Special Requests NONE  Final   Culture (A)  Final    <10,000 COLONIES/mL INSIGNIFICANT GROWTH Performed at [facility], [place] [address] [place], [place] [address]    Report Status 05/01/2020 FINAL  Final      Studies: No results found.   Scheduled Meds: . sodium chloride   Intravenous Once  . atorvastatin  20 mg Oral Daily  . doxazosin  1 mg Oral QHS  . feeding supplement (ENSURE ENLIVE)  237 mL Oral TID BM  . guaiFENesin-dextromethorphan  5 mL Oral Q6H  . insulin aspart  0-15 Units Subcutaneous TID AC & HS  . losartan  50 mg Oral Daily  . metoprolol tartrate  12.5 mg Oral BID  . multivitamin with minerals  1 tablet Oral Daily  . pantoprazole  40 mg Oral Daily  . rivaroxaban  20 mg Oral Q supper   Continuous Infusions:  Principal Problem:   Stroke [facility] Active Problems:   Essential hypertension   Uncontrolled type 2 diabetes mellitus with diabetic polyneuropathy, without long-term current use of insulin (HCC)   PAF (paroxysmal atrial fibrillation) (HCC)   Atherosclerosis of native coronary artery of native heart with angina pectoris ([place])   Primary malignant neoplasm of prostate metastatic to bone [facility])   Generalized weakness   Fall at home, initial encounter   Acute on chronic anemia   Elevated CK   Chronic kidney disease, stage 3a   Elevated troponin level not due myocardial infarction   Pressure injury of skin   Protein-calorie malnutrition, severe   Palliative care by specialist   Goals of care, counseling/discussion   DNR (do not resuscitate) discussion   FTT (failure to thrive) in adult   Need for emotional support     Vashti Hey, [facility]  If 7PM-7AM, please contact night-coverage [email or web address] Password [facility] 05/10/2020, 6:13 PM    LOS: 8 days

## 2020-05-10 NOTE — Progress Notes (Signed)
PT Cancellation Note  Patient Details Name: Adrian Singh MRN: [id number] DOB: [birth year]   Cancelled Treatment:    Reason Eval/Treat Not Completed: Other (comment) (Daughter states pt had "bad night".  Will return tomorrow. )   [name] 05/10/2020, 9:39 AM [name],PT Acute Rehabilitation Services Pager:  [phone]  Office:  [phone]

## 2020-05-10 NOTE — Progress Notes (Signed)
Patient was ready to discharge to SNF however developed epigastric pain and right upper quadrant pain on 05/10/2020. Elevated alk phos, RUQ ultrasound is pending. Patient is okay to discharge once these issues have been evaluated and resolved.

## 2020-05-10 NOTE — Progress Notes (Signed)
SLP Cancellation Note  Patient Details Name: Adrian Singh MRN: [id number] DOB: 1934/03/18   Cancelled treatment:       Reason Eval/Treat Not Completed: Medical issues which prohibited therapy (Pt NPO for procedure); will follow up as able   [place] MA, CCC-SLP 05/10/2020, 4:20 PM

## 2020-05-10 NOTE — Progress Notes (Signed)
Inpatient Diabetes Program Recommendations  AACE/ADA: New Consensus Statement on Inpatient Glycemic Control (2015)  Target Ranges:  Prepandial:   less than 140 mg/dL      Peak postprandial:   less than 180 mg/dL (1-2 hours)      Critically ill patients:  140 - 180 mg/dL   Lab Results  Component Value Date   GLUCAP 199 (H) 05/10/2020   HGBA1C 7.5 (H) 04/30/2020    Review of Glycemic Control Results for NAHEIM, BURGEN (MRN [id number]) as of 05/10/2020 12:41  Ref. Range 05/09/2020 12:05 05/09/2020 16:11 05/09/2020 21:04 05/10/2020 06:18 05/10/2020 11:15  Glucose-Capillary Latest Ref Range: 70 - 99 mg/dL 178 (H) 146 (H) 234 (H) 237 (H) 199 (H)   Diabetes history: DM 2 Outpatient Diabetes medications:  Amaryl 4 mg bid, Metformin 500 mg with breakfast Current orders for Inpatient glycemic control:  Novolog moderate tid with meals Inpatient Diabetes Program Recommendations:   If appropriate, consider adding Lantus 8 units daily.   Thanks  Adah Perl, RN, BC-ADM Inpatient Diabetes Coordinator Pager [phone] (8a-5p)

## 2020-05-10 NOTE — Progress Notes (Signed)
Nutrition Follow-up  RD working remotely.  DOCUMENTATION CODES:   Severe malnutrition in context of chronic illness  INTERVENTION:   -Continue Ensure Enlive po TID, each supplement provides 350 kcal and 20 grams of protein -Continue Magic cup TID with meals, each supplement provides 290 kcal and 9 grams of protein -Continue Hormel Shake TID with meals, each supplement provides 520 kcals and 22 grams protein -Continue MVI with minerals daily  NUTRITION DIAGNOSIS:   Severe Malnutrition related to chronic illness (prostate cancer) as evidenced by moderate fat depletion, severe fat depletion, moderate muscle depletion, severe muscle depletion, percent weight loss.  Ongoing  GOAL:   Patient will meet greater than or equal to 90% of their needs  Progressing   MONITOR:   PO intake, Supplement acceptance, Diet advancement, Labs, Weight trends, Skin, I & O's  REASON FOR ASSESSMENT:   Consult Assessment of nutrition requirement/status  ASSESSMENT:   86 year old male with past medical history of paroxysmal atrial fibrillation (on Xarelto), coronary disease (s/p PCI with stent to circumflex), diastolic congestive heart failure (last echo 02/2016 EF 45-50%), diabetes mellitus type 2, hypertension, gastroesophageal reflux disease, prostate cancer, hyperlipidemia who presents to [facility] emergency department status post fall.  8/15- s/p BSE- recommend regular diet with thin liquids 8/16- s/p BSE- downgraded to nectar thick liquids 8/17- downgraded to dysphagia 1 diet with nectar thick liquids 8/18- s/p MBSS- recommended dysphagia 1 diet with thin liquids  Reviewed I/O's: -2.1L x 24 hours and +2.3 L since admission  UOP: 2.2 L x 24 hours  Attempted to speak with pt via call to hospital room phone, however, unable to reach.   No meal intake documented since last visit. Per chart review, pt reports that he tries to consume about 50% of his meals. Noted pt is also consuming  Ensure supplements.   Pt with poor oral intake and malnutrition; would benefit from nutrient dense supplement. One Ensure Enlive supplement provides 350 kcals, 20 grams protein, and 44-45 grams of carbohydrate vs one Glucerna shake supplement, which provides 220 kcals, 10 grams of protein, and 26 grams of carbohydrate. Given pt's hx of DM, RD will reassess adequacy of PO intake, CBGS, and adjust supplement regimen as appropriate at follow-up.   Plan to d/c to SNF once medically stable.   Labs reviewed: CBGS: 146-234 (inpatient orders for glycemic control are 0-15 units insulin aspart TID before meals and bedtime).   Diet Order:   Diet Order            DIET - DYS 1 Room service appropriate? Yes with Assist; Fluid consistency: Thin  Diet effective now                 EDUCATION NEEDS:   Education needs have been addressed  Skin:  Skin Assessment: Skin Integrity Issues: Skin Integrity Issues:: Stage I, Incisions Stage I: coccyx Incisions: closed mid back  Last BM:  05/08/20  Height:   Ht Readings from Last 1 Encounters:  05/01/20 5\' 8"  (1.727 m)    Weight:   Wt Readings from Last 1 Encounters:  05/10/20 73.5 kg    Ideal Body Weight:  70 kg  BMI:  Body mass index is 24.63 kg/m.  Estimated Nutritional Needs:   Kcal:  1850-2050  Protein:  95-110 grams  Fluid:  > 1.8 L    Loistine Chance, RD, LDN, [place] Registered Dietitian II Certified Diabetes Care and Education Specialist Please refer to [facility] for RD and/or RD on-call/weekend/after hours pager

## 2020-05-11 ENCOUNTER — Inpatient Hospital Stay (HOSPITAL_COMMUNITY): Payer: Medicare Other

## 2020-05-11 LAB — GLUCOSE, CAPILLARY
Glucose-Capillary: 174 mg/dL — ABNORMAL HIGH (ref 70–99)
Glucose-Capillary: 163 mg/dL — ABNORMAL HIGH (ref 70–99)
Glucose-Capillary: 150 mg/dL — ABNORMAL HIGH (ref 70–99)
Glucose-Capillary: 161 mg/dL — ABNORMAL HIGH (ref 70–99)

## 2020-05-11 LAB — CBC WITH DIFFERENTIAL/PLATELET
Abs Immature Granulocytes: 0.17 10*3/uL — ABNORMAL HIGH (ref 0.00–0.07)
Basophils Absolute: 0 10*3/uL (ref 0.0–0.1)
Basophils Relative: 0 %
Eosinophils Absolute: 0 10*3/uL (ref 0.0–0.5)
Eosinophils Relative: 0 %
HCT: 31 % — ABNORMAL LOW (ref 39.0–52.0)
Hemoglobin: 9.4 g/dL — ABNORMAL LOW (ref 13.0–17.0)
Immature Granulocytes: 1 %
Lymphocytes Relative: 6 %
Lymphs Abs: 0.9 10*3/uL (ref 0.7–4.0)
MCH: 24.3 pg — ABNORMAL LOW (ref 26.0–34.0)
MCHC: 30.3 g/dL (ref 30.0–36.0)
MCV: 80.1 fL (ref 80.0–100.0)
Monocytes Absolute: 1.1 10*3/uL — ABNORMAL HIGH (ref 0.1–1.0)
Monocytes Relative: 7 %
Neutro Abs: 13.5 10*3/uL — ABNORMAL HIGH (ref 1.7–7.7)
Neutrophils Relative %: 86 %
Platelets: 208 10*3/uL (ref 150–400)
RBC: 3.87 MIL/uL — ABNORMAL LOW (ref 4.22–5.81)
RDW: 20.9 % — ABNORMAL HIGH (ref 11.5–15.5)
WBC: 15.8 10*3/uL — ABNORMAL HIGH (ref 4.0–10.5)
nRBC: 0 % (ref 0.0–0.2)

## 2020-05-11 LAB — COMPREHENSIVE METABOLIC PANEL
ALT: 15 U/L (ref 0–44)
AST: 20 U/L (ref 15–41)
Albumin: 1.7 g/dL — ABNORMAL LOW (ref 3.5–5.0)
Alkaline Phosphatase: 130 U/L — ABNORMAL HIGH (ref 38–126)
Anion gap: 10 (ref 5–15)
BUN: 22 mg/dL (ref 8–23)
CO2: 26 mmol/L (ref 22–32)
Calcium: 7.9 mg/dL — ABNORMAL LOW (ref 8.9–10.3)
Chloride: 101 mmol/L (ref 98–111)
Creatinine, Ser: 0.88 mg/dL (ref 0.61–1.24)
GFR calc Af Amer: >60 mL/min (ref >60)
GFR calc non Af Amer: >60 mL/min (ref >60)
Glucose, Bld: 195 mg/dL — ABNORMAL HIGH (ref 70–99)
Potassium: 3.9 mmol/L (ref 3.5–5.1)
Sodium: 137 mmol/L (ref 135–145)
Total Bilirubin: 0.6 mg/dL (ref 0.3–1.2)
Total Protein: 6.3 g/dL — ABNORMAL LOW (ref 6.5–8.1)

## 2020-05-11 MED ORDER — SODIUM CHLORIDE 0.9 % IV SOLN
1.5000 g | Freq: Four times a day (QID) | INTRAVENOUS | Status: DC
  Administered 2020-05-11 – 2020-05-12 (×5): 1.5 g via INTRAVENOUS
  Filled 2020-05-11 (×7): qty 4
  Filled 2020-05-11: qty 1.5

## 2020-05-11 NOTE — Progress Notes (Signed)
Physical Therapy Treatment Patient Details Name: Adrian Singh MRN: [id number] DOB: [birth year] Today's Date: 05/11/2020    History of Present Illness Pt is an 86 yo male presenting s/p fall with L sided weakness and slurred speech. CXR 8/15 without active disease. MRI showed acute infarct involving the R frontal centrum semi ovale as well as a hyperintense lesion involving the posterior R frontal Josi Roediger matter and R corpus callosum favored to reflect subacute ischemic infarct. PMH includes: HTN, DM, stroke, prostate ca, skin ca, GER, esophageal dilation, CAD, afib     PT Comments    Pt admitted with above diagnosis. Pt was able to ambulate and incr distance with RW and min assist and cues.  Pt still needs 3L O2 with activity to maintain O2.  Pt surprised at how much better he feels today. Daughter present as well. Pt currently with functional limitations due to balance and endurance deficits. Pt will benefit from skilled PT to increase their independence and safety with mobility to allow discharge to the venue listed below.     Follow Up Recommendations  SNF;Supervision/Assistance - 24 hour     Equipment Recommendations  Rolling walker with 5" wheels    Recommendations for Other Services       Precautions / Restrictions Precautions Precautions: Fall Restrictions Weight Bearing Restrictions: No    Mobility  Bed Mobility Overal bed mobility: Needs Assistance Bed Mobility: Supine to Sit     Supine to sit: Min assist     General bed mobility comments: minor LE assist and truncal assist up via R elbow, then pt scooted with min guard.  cues for direction.  Transfers Overall transfer level: Needs assistance Equipment used: Rolling walker (2 wheeled) Transfers: Sit to/from Stand Sit to Stand: Min assist;From elevated surface         General transfer comment: more forward assist than boost  Ambulation/Gait Ambulation/Gait assistance: Min assist;Min guard;+2  safety/equipment Gait Distance (Feet): 45 Feet Assistive device: Rolling walker (2 wheeled) Gait Pattern/deviations: Step-through pattern;Decreased stride length;Trunk flexed;Drifts right/left Gait velocity: slow   General Gait Details: short, mildly unsteady steps, but safe with the RW.  Stooped posture, minimal assist during turns and backing up.  Pt ambulated into hallway with chair follow and when pt stated his left LE felt like it was  fatigued, allowed pt to sit down.  VSS    Stairs             Wheelchair Mobility    Modified Rankin (Stroke Patients Only)       Balance Overall balance assessment: Needs assistance Sitting-balance support: No upper extremity supported;Feet supported Sitting balance-Leahy Scale: Fair     Standing balance support: Bilateral upper extremity supported;During functional activity Standing balance-Leahy Scale: Fair Standing balance comment: relies on UE support and external support.                             Cognition Arousal/Alertness: Awake/alert Behavior During Therapy: WFL for tasks assessed/performed Overall Cognitive Status: Within Functional Limits for tasks assessed                                 General Comments: initially appears [facility] but as pt fatigued towards end of session requiring multimodal cues for processing/following instruction       Exercises General Exercises - Lower Extremity Ankle Circles/Pumps: AROM;Both;10 reps;Seated Quad Sets: AROM;Both;10 reps;Supine Gluteal Sets:  AROM;Both;10 reps;Supine Long Arc Quad: AROM;Both;10 reps;Seated Heel Slides: AROM;Both;10 reps;Supine Straight Leg Raises: AROM;Both;10 reps;Supine    General Comments        Pertinent Vitals/Pain      Home Living                      Prior Function            PT Goals (current goals can now be found in the care plan section) Acute Rehab PT Goals Patient Stated Goal: to get better Progress  towards PT goals: Progressing toward goals    Frequency    Min 2X/week      PT Plan Current plan remains appropriate    Co-evaluation              AM-PAC PT "6 Clicks" Mobility   Outcome Measure  Help needed turning from your back to your side while in a flat bed without using bedrails?: A Little Help needed moving from lying on your back to sitting on the side of a flat bed without using bedrails?: A Little Help needed moving to and from a bed to a chair (including a wheelchair)?: A Little Help needed standing up from a chair using your arms (e.g., wheelchair or bedside chair)?: A Little Help needed to walk in hospital room?: A Little Help needed climbing 3-5 steps with a railing? : A Lot 6 Click Score: 17    End of Session Equipment Utilized During Treatment: Gait belt;Oxygen Activity Tolerance: Patient tolerated treatment well Patient left: with call bell/phone within reach;in chair;with chair alarm set;with family/visitor present Nurse Communication: Mobility status PT Visit Diagnosis: Unsteadiness on feet (R26.81);Muscle weakness (generalized) (M62.81);Difficulty in walking, not elsewhere classified (R26.2)     Time: 7505-1833 PT Time Calculation (min) (ACUTE ONLY): 21 min  Charges:  $Gait Training: 8-22 mins                     Menucha Dicesare W,PT [place] Pager:  [phone]  Office:  [place] 05/11/2020, 2:28 PM

## 2020-05-11 NOTE — Plan of Care (Signed)
  Problem: Education: Goal: Knowledge of General Education information will improve Description: Including pain rating scale, medication(s)/side effects and non-pharmacologic comfort measures Outcome: Progressing   Problem: Clinical Measurements: Goal: Ability to maintain clinical measurements within normal limits will improve Outcome: Progressing Goal: Will remain free from infection Outcome: Progressing Goal: Diagnostic test results will improve Outcome: Progressing Goal: Cardiovascular complication will be avoided Outcome: Progressing   Problem: Nutrition: Goal: Adequate nutrition will be maintained Outcome: Progressing   Problem: Activity: Goal: Risk for activity intolerance will decrease Outcome: Progressing   Problem: Elimination: Goal: Will not experience complications related to bowel motility Outcome: Progressing Goal: Will not experience complications related to urinary retention Outcome: Progressing   Problem: Pain Managment: Goal: General experience of comfort will improve Outcome: Progressing   Problem: Skin Integrity: Goal: Risk for impaired skin integrity will decrease Outcome: Progressing

## 2020-05-11 NOTE — Progress Notes (Signed)
[facility] PROGRESS NOTE    Progress Note  Adrian Singh  VZD:[id number] DOB: 1934/09/06 DOA: 04/30/2020 PCP: [name], MD     Brief Narrative:   Adrian Singh is an 86 y.o. male past medical history of paroxysmal atrial fibrillation, CAD diastolic heart failure diabetes mellitus type 2 who was admitted on 04/30/2020 for an acute stroke following a fall secondary to left hemiparesis and slurred speech.  Assessment/Plan:   Leukocytosis, hypoxia and new productive cough: LFTs are improved, right upper quadrant ultrasound showed no acute cholecystitis. He was started empirically on Protonix. He is having a new cough with a new elevated white count get a chest x-ray PA and lateral with a history of dysphagia and risk of aspiration. Start IV Unasyn.  Hypokalemia: Replete orally now resolved.  5 mm frontal lobe lesion and 2 cm corpus callosum lesion: Not entirely clear of the cause possibly a stroke. Neurosurgery was consulted who recommended reimaging in 2 months. Continue Xarelto for atrial fibrillation and statins.  Accessible atrial fibrillation: Rate controlled metoprolol continue Xarelto.  Essential hypertension: Controlled continue metoprolol.  Diabetes mellitus type 2: Continue sliding scale.  Chronic kidney disease stage IIIa: Creatinine at baseline.  Dysphagia with protein caloric malnutrition: Continue dysphagia one diet aspiration precaution.  Normocytic anemia: Status post one dose of IV iron hemoglobin is stable.  Metastatic prostate cancer: Follow-up with oncology as an outpatient.  Sacral coccygeal stage I pressure ulcer present on admission: RN Pressure Injury Documentation: Pressure Injury 05/03/20 Coccyx Stage 1 -  Intact skin with non-blanchable redness of a localized area usually over a bony prominence. pink (Active)  05/03/20 0908  Location: Coccyx  Location Orientation:   Staging: Stage 1 -  Intact skin with non-blanchable  redness of a localized area usually over a bony prominence.  Wound Description (Comments): pink  Present on Admission:      DVT prophylaxis: Xarelto Family Communication:Daughter Status is: Inpatient  Remains inpatient appropriate because:Hemodynamically unstable   Dispo:  Patient From: [place]  Planned Disposition: [place]  Expected discharge date: 05/12/20  Medically stable for discharge: No         Code Status:     Code Status Orders  (From admission, onward)         Start     Ordered   04/30/20 2047  Full code  Continuous        04/30/20 2046        Code Status History    Date Active Date Inactive Code Status Order ID Comments User Context   01/21/2017 1120 01/22/2017 1453 Full Code [id number]  Franchot Gallo, MD Inpatient   02/28/2016 1601 03/01/2016 0006 Full Code [id number]  Consuelo Pandy, PA-C ED   10/06/2015 0831 10/07/2015 1619 Full Code [id number]  [place], Peter M, MD Inpatient   09/22/2015 1031 09/22/2015 1617 Full Code [id number]  [place], Peter M, MD Inpatient   03/31/2014 2203 04/02/2014 1841 Full Code [id number]  Theressa Millard, MD Inpatient   Advance Care Planning Activity        IV Access:    Peripheral IV   Procedures and diagnostic studies:   US Abdomen Limited RUQ  Result Date: 05/10/2020 CLINICAL DATA:  Right upper quadrant abdominal pain EXAM: ULTRASOUND ABDOMEN LIMITED RIGHT UPPER QUADRANT COMPARISON:  None. FINDINGS: Gallbladder: The gallbladder contains layering sludge, however, there is no gallbladder wall thickening, the gallbladder is not distended, and no pericholecystic fluid is identified. The sonographic Percell Miller sign is  reportedly negative. Common bile duct: Diameter: 4 mm proximally. The distal duct is obscured by overlying bowel gas. Liver: No focal lesion identified. Within normal limits in parenchymal echogenicity. Portal vein is patent on color Doppler imaging with normal direction of  blood flow towards the liver. Other: No ascites IMPRESSION: Cholelithiasis without sonographic evidence of acute cholecystitis. Electronically Signed   By: Fidela Salisbury MD   On: 05/10/2020 22:39     Medical Consultants:    None.  Anti-Infectives:   Unasyn  Subjective:    Adrian Singh relates he has a new persistent productive cough.  Objective:    Vitals:   05/10/20 2045 05/11/20 0531 05/11/20 0756 05/11/20 1134  BP:  (!) 120/58 117/62 121/74  Pulse:  72 75 68  Resp:  14 16 16   Temp: 97.7 F (36.5 C) 98.2 F (36.8 C) 98.6 F (37 C) (!) 97.5 F (36.4 C)  TempSrc: Oral Oral Oral Oral  SpO2:  98% 96% 98%  Weight:  68.9 kg    Height:       SpO2: 98 % O2 Flow Rate (L/min): 2 L/min   Intake/Output Summary (Last 24 hours) at 05/11/2020 1247 Last data filed at 05/11/2020 0600 Gross per 24 hour  Intake 60 ml  Output 200 ml  Net -140 ml   Filed Weights   05/09/20 0327 05/10/20 0105 05/11/20 0531  Weight: 73.5 kg 73.5 kg 68.9 kg    Exam: General exam: In no acute distress. Respiratory system: Good air movement and clear to auscultation. Cardiovascular system: S1 & S2 heard, RRR. No JVD, murmurs, rubs, gallops or clicks.  Gastrointestinal system: Abdomen is nondistended, soft and nontender.  Central nervous system: Alert and oriented. No focal neurological deficits. Extremities: No pedal edema. Skin: No rashes, lesions or ulcers Psychiatry: Judgement and insight appear normal. Mood & affect appropriate.    Data Reviewed:    Labs: Basic Metabolic Panel: Recent Labs  Lab 05/05/20 0447 05/05/20 0447 05/06/20 0505 05/06/20 0505 05/07/20 7846 05/07/20 9629 05/08/20 5284 05/08/20 1324 05/09/20 4010 05/09/20 0705 05/10/20 0544 05/11/20 0615  NA 141   < > 141   < > 140  --  139  --  139  --  138 137  K 3.3*   < > 3.5   < > 3.2*   < > 3.7   < > 3.3*   < > 3.6 3.9  CL 111   < > 109   < > 107  --  107  --  104  --  97* 101  CO2 21*   < > 22   < > 22  --   25  --  26  --  30 26  GLUCOSE 128*   < > 201*   < > 167*  --  172*  --  150*  --  232* 195*  BUN 20   < > 15   < > 12  --  12  --  8  --  8 22  CREATININE 0.90   < > 0.81   < > 0.82  --  0.73  --  0.73  --  0.71 0.88  CALCIUM 7.0*   < > 6.8*   < > 7.0*  --  7.6*  --  7.8*  --  8.6* 7.9*  MG 1.9  --  1.7  --  1.7  --  2.2  --   --   --  1.7  --   PHOS  1.3*  --  1.9*  --  1.9*  --   --   --   --   --   --   --    < > = values in this interval not displayed.   GFR Estimated Creatinine Clearance: 61.5 mL/min (by C-G formula based on SCr of 0.88 mg/dL). Liver Function Tests: Recent Labs  Lab 05/05/20 0447 05/07/20 0243 05/10/20 0544 05/11/20 0615  AST 49* 29 28 20   ALT 18 17 17 15   ALKPHOS 236* 180* 164* 130*  BILITOT 0.6 0.7 0.7 0.6  PROT 6.0* 6.0* 7.2 6.3*  ALBUMIN 1.7* 1.6* 1.9* 1.7*   No results for input(s): LIPASE, AMYLASE in the last 168 hours. No results for input(s): AMMONIA in the last 168 hours. Coagulation profile No results for input(s): INR, PROTIME in the last 168 hours. COVID-19 Labs  No results for input(s): DDIMER, FERRITIN, LDH, CRP in the last 72 hours.  Lab Results  Component Value Date   [place] NEGATIVE 04/30/2020    CBC: Recent Labs  Lab 05/06/20 0505 05/06/20 0505 05/07/20 0243 05/07/20 0243 05/08/20 0620 05/08/20 2156 05/09/20 0705 05/10/20 0544 05/11/20 0615  WBC 5.2  --  5.7  --   --   --  5.7 12.5* 15.8*  NEUTROABS  --   --   --   --   --   --  3.8  --  13.5*  HGB 7.1*   < > 7.4*   < > 7.1* 9.0* 9.4* 10.3* 9.4*  HCT 23.4*   < > 24.4*   < > 23.9* 28.8* 30.3* 33.7* 31.0*  MCV 76.0*  --  76.0*  --   --   --  78.3* 77.5* 80.1  PLT 168  --  174  --   --   --  207 239 208   < > = values in this interval not displayed.   Cardiac Enzymes: No results for input(s): CKTOTAL, CKMB, CKMBINDEX, TROPONINI in the last 168 hours. BNP (last 3 results) No results for input(s): PROBNP in the last 8760 hours. CBG: Recent Labs  Lab  05/10/20 1115 05/10/20 1612 05/10/20 2117 05/11/20 0546 05/11/20 1116  GLUCAP 199* 245* 181* 174* 163*   D-Dimer: No results for input(s): DDIMER in the last 72 hours. Hgb A1c: No results for input(s): HGBA1C in the last 72 hours. Lipid Profile: No results for input(s): CHOL, HDL, LDLCALC, TRIG, CHOLHDL, LDLDIRECT in the last 72 hours. Thyroid function studies: No results for input(s): TSH, T4TOTAL, T3FREE, THYROIDAB in the last 72 hours.  Invalid input(s): FREET3 Anemia work up: No results for input(s): VITAMINB12, FOLATE, FERRITIN, TIBC, IRON, RETICCTPCT in the last 72 hours. Sepsis Labs: Recent Labs  Lab 05/07/20 0243 05/09/20 0705 05/10/20 0544 05/11/20 0615  WBC 5.7 5.7 12.5* 15.8*   Microbiology No results found for this or any previous visit (from the past 240 hour(s)).   Medications:   . sodium chloride   Intravenous Once  . atorvastatin  20 mg Oral Daily  . doxazosin  1 mg Oral QHS  . feeding supplement (ENSURE ENLIVE)  237 mL Oral TID BM  . guaiFENesin-dextromethorphan  5 mL Oral Q6H  . insulin aspart  0-15 Units Subcutaneous TID AC & HS  . losartan  50 mg Oral Daily  . metoprolol tartrate  12.5 mg Oral BID  . multivitamin with minerals  1 tablet Oral Daily  . pantoprazole  40 mg Oral Daily  . rivaroxaban  20 mg Oral Q  supper   Continuous Infusions:    LOS: 9 days   Charlynne Cousins  [facility]  05/11/2020, 12:47 PM

## 2020-05-12 DIAGNOSIS — D649 Anemia, unspecified: Secondary | ICD-10-CM | POA: Diagnosis not present

## 2020-05-12 DIAGNOSIS — I472 Ventricular tachycardia: Secondary | ICD-10-CM | POA: Diagnosis not present

## 2020-05-12 DIAGNOSIS — I639 Cerebral infarction, unspecified: Secondary | ICD-10-CM | POA: Diagnosis not present

## 2020-05-12 DIAGNOSIS — N1831 Chronic kidney disease, stage 3a: Secondary | ICD-10-CM | POA: Diagnosis not present

## 2020-05-12 DIAGNOSIS — M6282 Rhabdomyolysis: Secondary | ICD-10-CM | POA: Diagnosis not present

## 2020-05-12 DIAGNOSIS — Z8249 Family history of ischemic heart disease and other diseases of the circulatory system: Secondary | ICD-10-CM | POA: Diagnosis not present

## 2020-05-12 DIAGNOSIS — R55 Syncope and collapse: Secondary | ICD-10-CM | POA: Diagnosis not present

## 2020-05-12 DIAGNOSIS — G9389 Other specified disorders of brain: Secondary | ICD-10-CM | POA: Diagnosis not present

## 2020-05-12 DIAGNOSIS — C61 Malignant neoplasm of prostate: Secondary | ICD-10-CM | POA: Diagnosis not present

## 2020-05-12 DIAGNOSIS — E1122 Type 2 diabetes mellitus with diabetic chronic kidney disease: Secondary | ICD-10-CM | POA: Diagnosis present

## 2020-05-12 DIAGNOSIS — D696 Thrombocytopenia, unspecified: Secondary | ICD-10-CM | POA: Diagnosis not present

## 2020-05-12 DIAGNOSIS — I69354 Hemiplegia and hemiparesis following cerebral infarction affecting left non-dominant side: Secondary | ICD-10-CM | POA: Diagnosis not present

## 2020-05-12 DIAGNOSIS — I480 Paroxysmal atrial fibrillation: Secondary | ICD-10-CM | POA: Diagnosis not present

## 2020-05-12 DIAGNOSIS — Z7901 Long term (current) use of anticoagulants: Secondary | ICD-10-CM | POA: Diagnosis not present

## 2020-05-12 DIAGNOSIS — N179 Acute kidney failure, unspecified: Secondary | ICD-10-CM | POA: Diagnosis not present

## 2020-05-12 DIAGNOSIS — R42 Dizziness and giddiness: Secondary | ICD-10-CM | POA: Diagnosis not present

## 2020-05-12 DIAGNOSIS — E1165 Type 2 diabetes mellitus with hyperglycemia: Secondary | ICD-10-CM | POA: Diagnosis present

## 2020-05-12 DIAGNOSIS — R627 Adult failure to thrive: Secondary | ICD-10-CM | POA: Diagnosis not present

## 2020-05-12 DIAGNOSIS — T796XXD Traumatic ischemia of muscle, subsequent encounter: Secondary | ICD-10-CM | POA: Diagnosis not present

## 2020-05-12 DIAGNOSIS — E43 Unspecified severe protein-calorie malnutrition: Secondary | ICD-10-CM | POA: Diagnosis not present

## 2020-05-12 DIAGNOSIS — L89151 Pressure ulcer of sacral region, stage 1: Secondary | ICD-10-CM | POA: Diagnosis not present

## 2020-05-12 DIAGNOSIS — J189 Pneumonia, unspecified organism: Secondary | ICD-10-CM | POA: Diagnosis not present

## 2020-05-12 DIAGNOSIS — Z8673 Personal history of transient ischemic attack (TIA), and cerebral infarction without residual deficits: Secondary | ICD-10-CM | POA: Diagnosis not present

## 2020-05-12 DIAGNOSIS — I2510 Atherosclerotic heart disease of native coronary artery without angina pectoris: Secondary | ICD-10-CM | POA: Diagnosis not present

## 2020-05-12 DIAGNOSIS — M6281 Muscle weakness (generalized): Secondary | ICD-10-CM | POA: Diagnosis not present

## 2020-05-12 DIAGNOSIS — G939 Disorder of brain, unspecified: Secondary | ICD-10-CM | POA: Diagnosis not present

## 2020-05-12 DIAGNOSIS — Z20822 Contact with and (suspected) exposure to covid-19: Secondary | ICD-10-CM | POA: Diagnosis not present

## 2020-05-12 DIAGNOSIS — I69391 Dysphagia following cerebral infarction: Secondary | ICD-10-CM | POA: Diagnosis not present

## 2020-05-12 DIAGNOSIS — M1990 Unspecified osteoarthritis, unspecified site: Secondary | ICD-10-CM | POA: Diagnosis not present

## 2020-05-12 DIAGNOSIS — Z87891 Personal history of nicotine dependence: Secondary | ICD-10-CM | POA: Diagnosis not present

## 2020-05-12 DIAGNOSIS — I951 Orthostatic hypotension: Secondary | ICD-10-CM | POA: Diagnosis not present

## 2020-05-12 DIAGNOSIS — C7951 Secondary malignant neoplasm of bone: Secondary | ICD-10-CM | POA: Diagnosis not present

## 2020-05-12 DIAGNOSIS — D6859 Other primary thrombophilia: Secondary | ICD-10-CM | POA: Diagnosis not present

## 2020-05-12 DIAGNOSIS — Z85828 Personal history of other malignant neoplasm of skin: Secondary | ICD-10-CM | POA: Diagnosis not present

## 2020-05-12 DIAGNOSIS — Z66 Do not resuscitate: Secondary | ICD-10-CM | POA: Diagnosis not present

## 2020-05-12 DIAGNOSIS — Z823 Family history of stroke: Secondary | ICD-10-CM | POA: Diagnosis not present

## 2020-05-12 DIAGNOSIS — R262 Difficulty in walking, not elsewhere classified: Secondary | ICD-10-CM | POA: Diagnosis not present

## 2020-05-12 DIAGNOSIS — I129 Hypertensive chronic kidney disease with stage 1 through stage 4 chronic kidney disease, or unspecified chronic kidney disease: Secondary | ICD-10-CM | POA: Diagnosis not present

## 2020-05-12 DIAGNOSIS — Z888 Allergy status to other drugs, medicaments and biological substances status: Secondary | ICD-10-CM | POA: Diagnosis not present

## 2020-05-12 DIAGNOSIS — E1142 Type 2 diabetes mellitus with diabetic polyneuropathy: Secondary | ICD-10-CM | POA: Diagnosis not present

## 2020-05-12 DIAGNOSIS — I63531 Cerebral infarction due to unspecified occlusion or stenosis of right posterior cerebral artery: Secondary | ICD-10-CM | POA: Diagnosis not present

## 2020-05-12 LAB — CBC WITH DIFFERENTIAL/PLATELET
Abs Immature Granulocytes: 0.22 10*3/uL — ABNORMAL HIGH (ref 0.00–0.07)
Basophils Absolute: 0 10*3/uL (ref 0.0–0.1)
Basophils Relative: 0 %
Eosinophils Absolute: 0.1 10*3/uL (ref 0.0–0.5)
Eosinophils Relative: 0 %
HCT: 30.2 % — ABNORMAL LOW (ref 39.0–52.0)
Hemoglobin: 8.9 g/dL — ABNORMAL LOW (ref 13.0–17.0)
Immature Granulocytes: 2 %
Lymphocytes Relative: 7 %
Lymphs Abs: 0.9 10*3/uL (ref 0.7–4.0)
MCH: 23.7 pg — ABNORMAL LOW (ref 26.0–34.0)
MCHC: 29.5 g/dL — ABNORMAL LOW (ref 30.0–36.0)
MCV: 80.5 fL (ref 80.0–100.0)
Monocytes Absolute: 0.9 10*3/uL (ref 0.1–1.0)
Monocytes Relative: 6 %
Neutro Abs: 11.7 10*3/uL — ABNORMAL HIGH (ref 1.7–7.7)
Neutrophils Relative %: 85 %
Platelets: 178 10*3/uL (ref 150–400)
RBC: 3.75 MIL/uL — ABNORMAL LOW (ref 4.22–5.81)
RDW: 21.4 % — ABNORMAL HIGH (ref 11.5–15.5)
WBC: 13.7 10*3/uL — ABNORMAL HIGH (ref 4.0–10.5)
nRBC: 0 % (ref 0.0–0.2)

## 2020-05-12 LAB — GLUCOSE, CAPILLARY
Glucose-Capillary: 168 mg/dL — ABNORMAL HIGH (ref 70–99)
Glucose-Capillary: 147 mg/dL — ABNORMAL HIGH (ref 70–99)
Glucose-Capillary: 123 mg/dL — ABNORMAL HIGH (ref 70–99)

## 2020-05-12 LAB — SARS CORONAVIRUS 2 BY RT PCR (HOSPITAL ORDER, PERFORMED IN ~~LOC~~ HOSPITAL LAB): SARS Coronavirus 2: NEGATIVE

## 2020-05-12 MED ORDER — AMOXICILLIN-POT CLAVULANATE 875-125 MG PO TABS: 1.0000 | ORAL_TABLET | Freq: Two times a day (BID) | ORAL | 0 refills | Status: AC

## 2020-05-12 MED ORDER — SODIUM CHLORIDE 0.9 % IV BOLUS
500.0000 mL | Freq: Once | INTRAVENOUS | Status: AC
  Administered 2020-05-12: 500 mL via INTRAVENOUS

## 2020-05-12 MED ORDER — DOXAZOSIN MESYLATE 1 MG PO TABS
1.0000 mg | ORAL_TABLET | Freq: Every day | ORAL | Status: DC
Start: 2020-05-12 — End: 2020-06-24

## 2020-05-12 MED ORDER — RAMIPRIL 10 MG PO CAPS: 10.0000 mg | ORAL_CAPSULE | Freq: Every day | ORAL | 1 refills | Status: DC

## 2020-05-12 MED ORDER — FUROSEMIDE 40 MG PO TABS: 40.0000 mg | ORAL_TABLET | Freq: Every day | ORAL | 0 refills | Status: DC | PRN

## 2020-05-12 NOTE — Care Management Important Message (Signed)
Important Message  Patient Details  Name: JERAMY DIMMICK MRN: [id number] Date of Birth: [birth year]   Medicare Important Message Given:  Yes     Shelda Altes 05/12/2020, 4:29 PM

## 2020-05-12 NOTE — Progress Notes (Signed)
D/C instructions and scripts placed in packet at nurse's for transport to SNF. IV and tele removed. Tolerated well.

## 2020-05-12 NOTE — NC FL2 (Signed)
[place] LEVEL OF CARE SCREENING TOOL     IDENTIFICATION  Patient Name: Adrian Singh Birthdate: 20-Mar-1934 Sex: male Admission Date (Current Location): 04/30/2020  [facility] and [place] Number:  Herbalist and Address:  [facility], [place] [address], [place], [place] [address]      Provider Number: [id number]  Attending Physician Name and Address:  Charlynne Cousins, MD  Relative Name and Phone Number:  Richardson Landry [phone]    Current Level of Care: Hospital Recommended Level of Care: [place] Prior Approval Number:    Date Approved/Denied:   PASRR Number: [id number]  Discharge Plan: SNF    Current Diagnoses: Patient Active Problem List   Diagnosis Date Noted  . Protein-calorie malnutrition, severe 05/06/2020  . Palliative care by specialist   . Goals of care, counseling/discussion   . DNR (do not resuscitate) discussion   . FTT (failure to thrive) in adult   . Need for emotional support   . Pressure injury of skin 05/04/2020  . Stroke ([place]) 05/02/2020  . Generalized weakness 04/30/2020  . Fall at home, initial encounter 04/30/2020  . Acute on chronic anemia 04/30/2020  . Elevated CK 04/30/2020  . Chronic kidney disease, stage 3a 04/30/2020  . Elevated troponin level not due myocardial infarction 04/30/2020  . Fatigue 04/29/2020  . Malnutrition of mild degree ([place]) 04/06/2019  . Diabetic foot ulcer ([place]) 04/06/2019  . Mood disorder ([place]) 04/06/2019  . Aortic atherosclerosis ([place]) 02/13/2017  . Prostate cancer ([place]) 02/13/2017  . Primary malignant neoplasm of prostate metastatic to bone ([place]) 02/13/2017  . Orthostatic hypotension 03/19/2016  . CAD S/P percutaneous coronary angioplasty 02/28/2016  . Atherosclerosis of native coronary artery of native heart with angina pectoris ([place])   . Abnormal nuclear stress test 09/22/2015  . Thrombocytopenia ([place])   . Advanced directives,  counseling/discussion 09/27/2014  . PAF (paroxysmal atrial fibrillation) ([place]) 04/12/2014  . History of CVA (cerebrovascular accident) 03/31/2014  . CKD stage 3 due to type 2 diabetes mellitus ([place])   . Uncontrolled type 2 diabetes mellitus with diabetic polyneuropathy, without long-term current use of insulin ([place]) 11/05/2011  . Routine general medical examination at a health care facility 05/07/2011  . Essential hypertension 12/23/2009  . GERD 12/23/2009  . BPH (benign prostatic hyperplasia) 12/23/2009  . OSTEOARTHRITIS 12/23/2009    Orientation RESPIRATION BLADDER Height & Weight     Self, Time, Situation, Place  O2 (Nasal canula 2 liters) Continent, External catheter Weight: 159 lb (72.1 kg) Height:  5\' 8"  (172.7 cm)  BEHAVIORAL SYMPTOMS/MOOD NEUROLOGICAL BOWEL NUTRITION STATUS  Dangerous to self, others or property   Continent Diet (see dc summary)  AMBULATORY STATUS COMMUNICATION OF NEEDS Skin     Verbally Other (Comment) (Pressure Injury 05/03/20 coccyx stage 1 intact skin)                       Personal Care Assistance Level of Assistance              Functional Limitations Info  Sight Sight Info: Impaired        SPECIAL CARE FACTORS FREQUENCY  PT (By licensed PT), OT (By licensed OT)     PT Frequency: 5X week OT Frequency: 5X week            Contractures Contractures Info: Not present    Additional Factors Info  Code Status, Allergies, Insulin Sliding Scale Code Status Info: Full Allergies  Info: Soma (Carisoprodol), Doxazosin   Insulin Sliding Scale Info: insulin aspart (novoLOG) injection 0-15 Units three times a day before meals and bedtime       Current Medications (05/12/2020):  This is the current hospital active medication list Current Facility-Administered Medications  Medication Dose Route Frequency Provider Last Rate Last Admin  . 0.9 %  sodium chloride infusion (Manually program via Guardrails IV Fluids)   Intravenous Once Arrien,  Jimmy Picket, MD      . acetaminophen (TYLENOL) tablet 650 mg  650 mg Oral Q6H PRN Vernelle Emerald, MD   650 mg at 05/11/20 1529   Or  . acetaminophen (TYLENOL) suppository 650 mg  650 mg Rectal Q6H PRN Vernelle Emerald, MD   650 mg at 05/05/20 1714  . albuterol (PROVENTIL) (2.5 MG/3ML) 0.083% nebulizer solution 2.5 mg  2.5 mg Nebulization Q6H PRN Pokhrel, Laxman, MD   2.5 mg at 05/07/20 0645  . alum & mag hydroxide-simeth (MAALOX/MYLANTA) 200-200-20 MG/5ML suspension 15 mL  15 mL Oral Q6H PRN Vashti Hey, MD   15 mL at 05/12/20 0833  . ampicillin-sulbactam (UNASYN) 1.5 g in sodium chloride 0.9 % 100 mL IVPB  1.5 g Intravenous Q6H Charlynne Cousins, MD 200 mL/hr at 05/12/20 1217 1.5 g at 05/12/20 1217  . atorvastatin (LIPITOR) tablet 20 mg  20 mg Oral Daily Shalhoub, Sherryll Burger, MD   20 mg at 05/12/20 0835  . doxazosin (CARDURA) tablet 1 mg  1 mg Oral QHS Donne Hazel, MD   1 mg at 05/11/20 2200  . feeding supplement (ENSURE ENLIVE) (ENSURE ENLIVE) liquid 237 mL  237 mL Oral TID BM Pokhrel, Laxman, MD   237 mL at 05/12/20 1217  . guaiFENesin-dextromethorphan (ROBITUSSIN DM) 100-10 MG/5ML syrup 5 mL  5 mL Oral Q6H Arrien, Jimmy Picket, MD   5 mL at 05/11/20 0501  . insulin aspart (novoLOG) injection 0-15 Units  0-15 Units Subcutaneous TID AC & HS Pokhrel, Laxman, MD   2 Units at 05/12/20 1215  . lip balm (BLISTEX) ointment   Topical PRN Vashti Hey, MD   Given at 05/10/20 1517  . magic mouthwash w/lidocaine  10 mL Oral QID PRN Donne Hazel, MD   10 mL at 05/08/20 2333  . metoprolol tartrate (LOPRESSOR) 25 mg/10 mL oral suspension 12.5 mg  12.5 mg Oral BID Donne Hazel, MD   12.5 mg at 05/12/20 0835  . morphine 2 MG/ML injection 2 mg  2 mg Intravenous Q4H PRN Donne Hazel, MD   2 mg at 05/10/20 1101  . multivitamin with minerals tablet 1 tablet  1 tablet Oral Daily Donne Hazel, MD   1 tablet at 05/12/20 0835  . nitroGLYCERIN (NITROSTAT) SL tablet  0.4 mg  0.4 mg Sublingual Q5 min PRN Vernelle Emerald, MD      . ondansetron [facility]) tablet 4 mg  4 mg Oral Q6H PRN Shalhoub, Sherryll Burger, MD       Or  . ondansetron [facility]) injection 4 mg  4 mg Intravenous Q6H PRN Shalhoub, Sherryll Burger, MD   4 mg at 05/09/20 1925  . pantoprazole (PROTONIX) EC tablet 40 mg  40 mg Oral Daily Bonnell Public Tublu, MD   40 mg at 05/12/20 0835  . phenol (CHLORASEPTIC) mouth spray 1 spray  1 spray Mouth/Throat PRN Donne Hazel, MD   1 spray at 05/06/20 1505  . polyethylene glycol (MIRALAX / GLYCOLAX) packet 17 g  17 g Oral  Daily PRN Shalhoub, Sherryll Burger, MD      . Resource ThickenUp Clear   Oral PRN Donne Hazel, MD      . rivaroxaban [name]) tablet 20 mg  20 mg Oral Q supper Wendee Beavers, RPH   20 mg at 05/11/20 1754  . simethicone (MYLICON) 40 YS/1.6OH suspension 40 mg  40 mg Oral QID PRN Vashti Hey, MD   40 mg at 05/11/20 7290  . traMADol (ULTRAM) tablet 50 mg  50 mg Oral Q6H PRN Vernelle Emerald, MD   50 mg at 05/12/20 0138   Or  . traMADol (ULTRAM) tablet 100 mg  100 mg Oral Q6H PRN Vernelle Emerald, MD   100 mg at 05/11/20 1202  . white petrolatum (VASELINE) gel   Topical PRN Vashti Hey, MD   0.2 application at 21/11/55 2080     Discharge Medications: Please see discharge summary for a list of discharge medications.  Relevant Imaging Results:  Relevant Lab Results:   Additional Information [facility] [place] Neri Samek, LCSWA

## 2020-05-12 NOTE — Discharge Summary (Signed)
Physician Discharge Summary  TAB Adrian Singh:[id number] DOB: 1934-03-31 DOA: 04/30/2020  PCP: [name], MD  Admit date: 04/30/2020 Discharge date: 05/12/2020  Admitted From: Home Disposition:  SNF  Recommendations for Outpatient Follow-up:  1. Follow up with PCP in 1-2 weeks 2. Please obtain BMP/CBC in one week   Home Health:No Equipment/Devices:None  Discharge Condition:Stable CODE STATUS:Full Diet recommendation: Heart Healthy   Brief/Interim Summary: 86 y.o. male past medical history of paroxysmal atrial fibrillation, CAD diastolic heart failure diabetes mellitus type 2 who was admitted on 04/30/2020 for an acute stroke following a fall secondary to left hemiparesis and slurred speech.  Discharge Diagnoses:  Principal Problem:   Stroke [facility] Active Problems:   Essential hypertension   Uncontrolled type 2 diabetes mellitus with diabetic polyneuropathy, without long-term current use of insulin (HCC)   PAF (paroxysmal atrial fibrillation) (HCC)   Atherosclerosis of native coronary artery of native heart with angina pectoris ([place])   Primary malignant neoplasm of prostate metastatic to [facility])   Generalized weakness   Fall at home, initial encounter   Acute on chronic anemia   Elevated CK   Chronic kidney disease, stage 3a   Elevated troponin level not due myocardial infarction   Pressure injury of skin   Protein-calorie malnutrition, severe   Palliative care by specialist   Goals of care, counseling/discussion   DNR (do not resuscitate) discussion   FTT (failure to thrive) in adult   Need for emotional support  Healthcare associated pneumonia: He was having intermittent hypoxia with leukocytosis and new productive cough he was started on IV Unasyn, chest x-ray showed possibly new left lower lobe infiltrate his leukocytosis improved he was transitioned to oral Unasyn if she continues an outpatient.  Hypokalemia: Replete orally now resolved.  5 mm frontal  lobe lesion and 2 cm corpus callosum lesion: No entirely clear of the cause question possible stroke neurosurgery was consulted recommended reimaging in 2 months.  Paroxysmal atrial fibrillation: Rate controlled on metoprolol and Xarelto.  Essential hypertension: Continue current regimen.  Diabetes mellitus type 2: Explained the changes made to his medication.  Chronic kidney disease stage IIIa: Creatinine at baseline.  Dysphagia with protein caloric malnutrition: Continue dysphagia 1 diet and aspiration precaution.  Normocytic anemia: Status post IV iron, his hemoglobin has remained stable will need a CBC in a week.  Static prostate cancer: Follow-up with oncology as an outpatient.    Discharge Instructions  Discharge Instructions    Ambulatory referral to Neurology   Complete by: As directed    An appointment is requested in approximately: 4-6 weeks   Diet - low sodium heart healthy   Complete by: As directed    Increase activity slowly   Complete by: As directed    No wound care   Complete by: As directed      Allergies as of 05/12/2020      Reactions   Soma [carisoprodol] Other (See Comments)   "did a number on me"   Doxazosin Other (See Comments)   dizziness      Medication List    TAKE these medications   acetaminophen 650 MG CR tablet Commonly known as: TYLENOL Take 650 mg by mouth 2 (two) times daily as needed for pain.   amoxicillin-clavulanate 875-125 MG tablet Commonly known as: Augmentin Take 1 tablet by mouth 2 (two) times daily for 5 days.   atorvastatin 40 MG tablet Commonly known as: LIPITOR Take 1/2 (one-half) tablet by mouth once daily What changed:  See the new instructions.   b complex vitamins capsule Take 1 capsule by mouth daily.   Bayer Microlet Lancets lancets Use to test blood sugar once daily dx: E11.40   Calcium 600 1500 (600 Ca) MG Tabs tablet Generic drug: calcium carbonate Take 600 mg of elemental calcium by mouth  daily with breakfast.   CONTOUR NEXT EZ MONITOR w/Device Kit USE TO TEST BLOOD SUGAR ONCE DAILY. Dx Code E11.40   Contour Next Test test strip Generic drug: glucose blood Check blood sugar once daily and as directed.Dx Code E11.40   cyanocobalamin 1000 MCG tablet Take 1,000 mg by mouth at bedtime.   docusate sodium 250 MG capsule Commonly known as: COLACE Take 250 mg by mouth daily as needed for constipation.   doxazosin 1 MG tablet Commonly known as: CARDURA Take 1 tablet (1 mg total) by mouth at bedtime.   furosemide 40 MG tablet Commonly known as: LASIX Take 1 tablet (40 mg total) by mouth daily as needed. Start taking on: May 15, 2020 What changed: These instructions start on May 15, 2020. If you are unsure what to do until then, ask your doctor or other care provider.   glimepiride 4 MG tablet Commonly known as: AMARYL Take 1 tablet by mouth twice daily What changed: when to take this   metFORMIN 500 MG tablet Commonly known as: GLUCOPHAGE Take 1 tablet (500 mg total) by mouth daily with breakfast.   metoprolol succinate 100 MG 24 hr tablet Commonly known as: TOPROL-XL Take 50 mg by mouth at bedtime. Take with or immediately following a meal.   multivitamin with minerals Tabs tablet Take 1 tablet by mouth daily. ONE-A-DAY FOR MEN   nitroGLYCERIN 0.4 MG SL tablet Commonly known as: NITROSTAT Place 1 tablet (0.4 mg total) under the tongue every 5 (five) minutes as needed for chest pain.   OVER THE COUNTER MEDICATION Take 15 mLs by mouth in the morning and at bedtime. Bio-Cell   ramipril 10 MG capsule Commonly known as: ALTACE Take 1 capsule (10 mg total) by mouth daily. Start taking on: May 16, 2020 What changed: These instructions start on May 16, 2020. If you are unsure what to do until then, ask your doctor or other care provider.   rivaroxaban 20 MG Tabs tablet Commonly known as: XARELTO Take 1 tablet (20 mg total) by mouth daily with  supper.   tamsulosin 0.4 MG Caps capsule Commonly known as: FLOMAX Take 1 capsule (0.4 mg total) by mouth daily. What changed: when to take this   traMADol 50 MG tablet Commonly known as: ULTRAM TAKE 1 TABLET BY MOUTH THREE TIMES DAILY AS NEEDED What changed:   when to take this  reasons to take this   vitamin C 1000 MG tablet Take 1,000 mg by mouth daily.   Vitamin D3 1.25 MG (50000 UT) Caps Take 50,000 Units by mouth once a week. Saturday   Xtandi 40 MG capsule Generic drug: enzalutamide Take 80 mg by mouth daily.       Allergies  Allergen Reactions  . Soma [Carisoprodol] Other (See Comments)    "did a number on me"  . Doxazosin Other (See Comments)    dizziness    Consultations:  none   Procedures/Studies: EEG  Result Date: 05/01/2020 Lora Havens, MD     05/01/2020  3:33 PM Patient Name: DOMINIQ FONTAINE MRN: [id number] Epilepsy Attending: Lora Havens Referring Physician/Provider: Dr Lesleigh Noe Date: 05/01/2020 Duration: 23.22 mins Patient history: 85yo  M s/p fall and left sided weakness. EEG to evaluate for seizure Level of alertness: Awake, assleep AEDs during EEG study: None Technical aspects: This EEG study was done with scalp electrodes positioned according to the 10-20 International system of electrode placement. Electrical activity was acquired at a sampling rate of _0  and reviewed with a high frequency filter of _1  and a low frequency filter of _2 . EEG data were recorded continuously and digitally stored. Description: The posterior dominant rhythm consists of 7.5 Hz activity of moderate voltage (25-35 uV) seen predominantly in posterior head regions, symmetric and reactive to eye opening and eye closing.  Sleep was characterized by vertex waves, sleep spindles (12 to 14 Hz), maximal frontocentral region. EEG showed /intermittent generalized 3 to 6 Hz theta-delta slowing. Hyperventilation and photic stimulation were not performed.   ABNORMALITY  -Intermittent slow, generalized IMPESSION: This study is suggestive of mild diffuse encephalopathy, nonspecific etiology. No seizures or epileptiform discharges were seen throughout the recording. Lora Havens   DG Chest 1 View  Result Date: 04/30/2020 CLINICAL DATA:  Pneumonia, history of AFib EXAM: CHEST  1 VIEW COMPARISON:  None. FINDINGS: The heart size and mediastinal contours are within normal limits. Both lungs are clear. The visualized skeletal structures are unremarkable. There is a small hiatal hernia. IMPRESSION: No active disease. Electronically Signed   By: Prudencio Pair M.D.   On: 04/30/2020 21:08   DG Chest 2 View  Result Date: 05/11/2020 CLINICAL DATA:  Dyspnea EXAM: CHEST - 2 VIEW COMPARISON:  05/06/2019 FINDINGS: Cardiac shadow is stable. The lungs are well aerated bilaterally. No focal infiltrate or sizable effusion is seen. Old healing rib fracture on the right is noted involving the sixth rib posteriorly. No acute infiltrate is noted. IMPRESSION: No acute abnormality noted. Electronically Signed   By: Inez Catalina M.D.   On: 05/11/2020 15:13   DG Ribs Unilateral W/Chest Right  Result Date: 04/30/2020 CLINICAL DATA:  Fall EXAM: RIGHT RIBS AND CHEST - 3+ VIEW COMPARISON:  Chest radiograph dated 02/28/2016 FINDINGS: No fracture or other bone lesions are seen involving the ribs. There is no evidence of pneumothorax or pleural effusion. Both lungs are clear. Heart size and mediastinal contours are within normal limits. IMPRESSION: Negative. Electronically Signed   By: Zerita Boers M.D.   On: 04/30/2020 18:14   DG Lumbar Spine Complete  Result Date: 04/30/2020 CLINICAL DATA:  Fall with back pain EXAM: LUMBAR SPINE - COMPLETE 4+ VIEW COMPARISON:  None. FINDINGS: There is no evidence of lumbar spine fracture. There is lumbar dextrocurvature. Moderate to severe degenerative changes are seen in the lumbar spine. IMPRESSION: Negative for acute fracture. Electronically Signed   By: Zerita Boers M.D.   On: 04/30/2020 18:16   CT Head Wo Contrast  Result Date: 04/30/2020 CLINICAL DATA:  Status post fall. EXAM: CT HEAD WITHOUT CONTRAST TECHNIQUE: Contiguous axial images were obtained from the base of the skull through the vertex without intravenous contrast. COMPARISON:  March 31, 2014 FINDINGS: Brain: There is mild cerebral atrophy with widening of the extra-axial spaces and ventricular dilatation. There are areas of decreased attenuation within the white matter tracts of the supratentorial brain, consistent with microvascular disease changes. Vascular: No hyperdense vessel or unexpected calcification. Skull: Normal. Negative for fracture or focal lesion. Sinuses/Orbits: No acute finding. Other: None. IMPRESSION: 1. Generalized cerebral atrophy. 2. No acute intracranial abnormality. Electronically Signed   By: Virgina Norfolk M.D.   On: 04/30/2020 20:54   MR ANGIO HEAD WO  CONTRAST  Result Date: 05/01/2020 CLINICAL DATA:  Brain mass or lesion. EXAM: MRA HEAD WITHOUT CONTRAST TECHNIQUE: Angiographic images of the Circle of Willis were obtained using MRA technique without intravenous contrast. COMPARISON:  Concurrent MRI head with contrast. FINDINGS: Anterior circulation: Patent, normal caliber appearance of the bilateral ICAs. Right A1 segment hypoplasia with moderate to severe short segment narrowing (1041:9). The bilateral MCAs are unremarkable. No proximal occlusion, dissection, vascular malformation or aneurysm. Posterior circulation: Codominant. Normal appearance of the V4 segments. Dominant right AICA. There is no significant stenosis, proximal occlusion, aneurysm, dissection or vascular malformation. Venous sinuses: No evidence of thrombosis. Anatomic variants: Bilateral PCOM hypoplasia. IMPRESSION: High-grade short-segment narrowing involving the right A1 segment. No evidence of aneurysm, dissection or proximal occlusion. Electronically Signed   By: Primitivo Gauze M.D.   On:  05/01/2020 16:58   MR BRAIN WO CONTRAST  Result Date: 05/01/2020 CLINICAL DATA:  Initial evaluation for fall, left-sided weakness and slurred speech. EXAM: MRI HEAD WITHOUT CONTRAST TECHNIQUE: Multiplanar, multiecho pulse sequences of the brain and surrounding structures were obtained without intravenous contrast. COMPARISON:  Prior head CT from 04/30/2020. FINDINGS: Brain: Generalized age appropriate cerebral volume loss. No significant cerebral white matter disease for age. Few small foci of encephalomalacia and gliosis involving the right frontal parietal cortices consistent with chronic right MCA territory infarcts (series 11, images 22, 20). 5 mm focus of restricted diffusion involving the right frontal centrum semi ovale consistent with a small acute ischemic infarct (series 5, image 85). No associated hemorrhage or mass effect. Additionally, there is a round well-circumscribed ovoid T2/FLAIR hyperintense lesion measuring 1.8 x 1.6 x 2.0 cm (series 10, image 19). Inferior extension to involve the right aspect of the body of the corpus callosum. Mild localized mass effect on the subjacent right lateral ventricle (series 9, image 10). Associated diffusion signal abnormality without frank restriction or ADC correlate. No associated susceptibility artifact to suggest hemorrhage. Findings indeterminate, but favored to reflect an evolving subacute ischemic infarct. Sequelae of demyelination or possibly tumor could also be considered. No significant surrounding edema or FLAIR signal intensity. No other evidence for acute or subacute ischemia. Gray-white matter differentiation otherwise maintained. No foci of susceptibility artifact to suggest acute or chronic intracranial hemorrhage. No other mass lesion, midline shift or mass effect. No hydrocephalus or extra-axial fluid collection. Pituitary gland suprasellar region within normal limits. Midline structures intact. Vascular: Major intracranial vascular flow  voids maintained. Skull and upper cervical spine: Craniocervical junction within normal limits. Degenerative spondylosis at C3-4 with resultant mild-to-moderate spinal stenosis. Subcentimeter T1 hypointense lesion within the C3 vertebral body likely degenerative. Bone marrow signal intensity within normal limits. No scalp soft tissue abnormality. Sinuses/Orbits: Globes and orbital soft tissues within normal limits. Mild scattered mucosal thickening noted within the ethmoidal air cells. Paranasal sinuses are otherwise clear. Trace right mastoid effusion noted, of doubtful significance. Inner ear structures grossly normal. Other: None. IMPRESSION: 1. 5 mm acute ischemic nonhemorrhagic infarct involving the right frontal centrum semi ovale. 2. 1.8 x 1.6 x 2.0 cm ovoid T2/FLAIR hyperintense lesion involving the posterior right frontal white matter and right corpus callosum, indeterminate, but favored to reflect an evolving subacute ischemic infarct. Sequelae of demyelination or possibly tumor could also be considered. Further assessment with postcontrast imaging recommended for complete characterization. Additionally, a short interval follow-up MRI in 2-3 months would likely be helpful for further evaluation as well. 3. Few small chronic right MCA territory infarcts involving the right frontoparietal cortices as above. 4. Otherwise normal  brain MRI for age. Electronically Signed   By: Jeannine Boga M.D.   On: 05/01/2020 01:37   MR BRAIN W CONTRAST  Result Date: 05/01/2020 CLINICAL DATA:  Brain mass EXAM: MRI HEAD WITH CONTRAST TECHNIQUE: Multiplanar, multiecho pulse sequences of the brain and surrounding structures were obtained with intravenous contrast. CONTRAST:  43m GADAVIST GADOBUTROL 1 MMOL/ML IV SOLN COMPARISON:  04/30/2020 head CT.  05/01/2020 MRI head. FINDINGS: Brain: Mild cerebral atrophy with ex vacuo dilatation. Sequela of remote right MCA territory infarcts, better demonstrated on prior exam.  Previously visualized 5 mm focus of right centrum semiovale restricted diffusion does not demonstrate enhancement (10:40). Prior ovoid T2 hypointense lesion involving the right centrum semiovale and right corpus callosum body measuring 1.7 x 1.5 cm (10:40) demonstrates heterogenous enhancement. There are no additional foci of abnormal enhancement. No midline shift, ventriculomegaly or extra-axial fluid collection. Vascular: Please see MRA. Skull and upper cervical spine: Normal marrow signal intensity. No enhancing lesion. Sinuses/Orbits: Normal orbits. Clear paranasal sinuses. No mastoid effusion. Other: None. IMPRESSION: Heterogenously enhancing 1.7 cm right centrum semiovale lesion extending into the corpus callosum body may reflect an active demyelinating focus versus subacute infarct. Short-term interval follow-up of 2-3 months is recommended. 5 mm focus of prior restricted diffusion within the anterior right centrum semiovale does not enhance and likely reflects infarct. Electronically Signed   By: CPrimitivo GauzeM.D.   On: 05/01/2020 16:49   CT CHEST ABDOMEN PELVIS W CONTRAST  Result Date: 05/01/2020 CLINICAL DATA:  Looking for malignancy in setting of brain mass, poss metastatic lesion Patient has history of prostate cancer. EXAM: CT CHEST, ABDOMEN, AND PELVIS WITH CONTRAST TECHNIQUE: Multidetector CT imaging of the chest, abdomen and pelvis was performed following the standard protocol during bolus administration of intravenous contrast. CONTRAST:  1032mOMNIPAQUE IOHEXOL 300 MG/ML  SOLN COMPARISON:  Abdominal CT 09/14/2019, abdominal CT 02/05/2017. No prior cross-sectional imaging of the chest. No dominant pulmonary mass. Scattered subsegmental atelectasis in the left greater than right lower lobe. Compressive atelectasis in the lung bases related to hiatal hernia. No findings of pulmonary edema. No pleural fluid. Trachea and central bronchi are patent. FINDINGS: CT CHEST FINDINGS Cardiovascular:  Aortic atherosclerosis. No aortic aneurysm or dissection. Common origin of the brachiocephalic and left common carotid artery. Aortic branch vessels are tortuous. Heart is normal in size. Coronary artery calcifications versus stent. No pericardial effusion. Mediastinum/Nodes: Moderate hiatal hernia. Mild wall thickening at the gastroesophageal junction. The upper esophagus is patulous. No visualized thyroid nodule. No enlarged mediastinal or hilar lymph nodes. No axillary adenopathy. Lungs/Pleura: 5 mm medial right lower lobe pulmonary nodule in the inferior most right lower lobe series 4, image 92, stable from prior exam. Ill-defined perifissural nodule in the right lower lobe measuring 6 mm, series 4, image 69. Musculoskeletal: Multifocal sclerotic lesions throughout the skeleton. This includes the right proximal humerus, right scapula, both clavicles, multiple ribs and throughout the thoracic spine. Two 6 and T7 vertebral bodies are diffusely sclerotic. Diffuse degenerative change throughout the thoracic spine. There is no evidence of pathologic fracture. CT ABDOMEN PELVIS FINDINGS Hepatobiliary: No focal hepatic lesion. Gallbladder is unremarkable. No biliary dilatation. Pancreas: Parenchymal atrophy. Low-density lesion involving the pancreatic head measures 2 cm, grossly stable from immediate prior exam, however motion artifact obscures detailed assessment. Few pancreatic head calcifications. No peripancreatic fat stranding. No ductal dilatation. Spleen: Low-density lesion in the splenic dome, series 3, image 38, partially obscured, but grossly unchanged from prior exam. Normal in size. Adrenals/Urinary Tract: No  adrenal nodule. No hydronephrosis or perinephric edema. Homogeneous renal enhancement with symmetric excretion on delayed phase imaging. There is bilateral renal cortical scarring and small low-density renal cortical lesions in both kidneys, too small to characterize but likely cysts. No evidence of  suspicious renal lesion. Urinary bladder is physiologically distended without wall thickening. Stomach/Bowel: Moderate hiatal hernia. No gastric wall thickening or evidence of mass. Normal positioning of the duodenum and ligament of Treitz. No small bowel obstruction, administered enteric contrast reaches the colon. No bowel wall thickening. The appendix is not well visualized on the current exam. Mild gaseous colonic distension without colonic wall thickening. Sigmoid colon is tortuous and courses into the upper abdomen. Scattered small volume of colonic stool. There is no colonic wall thickening or evidence of colonic mass. Vascular/Lymphatic: Aortic atherosclerosis. No aortic aneurysm. Portal vein is patent. Splenic artery is tortuous. No acute vascular findings. No enlarged lymph nodes in the abdomen or pelvis. Reproductive: Prostate gland normal in size. The previous enhancing focus in the right aspect of the gland is not well visualized currently. There is mild mass effect of the left aspect of the prostate on the bladder base. Other: No ascites.  No omental thickening or omental disease. Musculoskeletal: Multifocal sclerotic osseous metastatic disease throughout the pelvis and spine. The degree of bony sclerosis involving the right iliac bone is progressed from prior, for example series 3, image 88. There is also progression involving the left iliac bone, series 3, image 87. There is no evidence of pathologic fracture. IMPRESSION: 1. Multifocal sclerotic osseous metastatic disease throughout the chest, abdomen, and pelvis, patient with known metastatic prostate cancer. Progressive bony sclerosis involving the right and left iliac bones from December suggests progression of osseous metastatic disease. 2. Stable 5 mm right lower lobe pulmonary nodule. There is a 6 mm perifissural nodule in the right lower lobe that was not previously included in the field of view, nonspecific. This may represent an  intrapulmonary lymph node, however recommend attention on follow-up. 3. Low-density lesion in the pancreatic head is grossly stable from immediate prior exam, however motion artifact obscures detailed assessment currently. 4. Moderate hiatal hernia. Aortic Atherosclerosis (ICD10-I70.0). Electronically Signed   By: Keith Rake M.D.   On: 05/01/2020 20:05   DG CHEST PORT 1 VIEW  Result Date: 05/05/2020 CLINICAL DATA:  86 year old male with fall and shortness of breath. EXAM: PORTABLE CHEST 1 VIEW COMPARISON:  Chest radiograph dated 05/03/2020 FINDINGS: Mild chronic interstitial coarsening. No focal consolidation, pleural effusion or pneumothorax. The cardiac silhouette is within limits. Atherosclerotic calcification of the aorta. No acute osseous pathology. IMPRESSION: No active disease. Electronically Signed   By: Anner Crete M.D.   On: 05/05/2020 18:21   DG Chest Port 1 View  Result Date: 05/03/2020 CLINICAL DATA:  Respiratory distress EXAM: PORTABLE CHEST 1 VIEW COMPARISON:  04/30/2020 FINDINGS: Shallow lung inflation with left basilar atelectasis. No focal airspace consolidation. No pleural effusion or pneumothorax. IMPRESSION: Shallow lung inflation with left basilar atelectasis. Electronically Signed   By: Ulyses Jarred M.D.   On: 05/03/2020 00:43   DG Shoulder Left  Result Date: 04/30/2020 CLINICAL DATA:  Fall with left shoulder pain EXAM: LEFT SHOULDER - 2+ VIEW COMPARISON:  Left shoulder radiographs dated 02/18/2004. FINDINGS: There is no evidence of fracture or dislocation. Degenerative changes are seen in the acromioclavicular joint. Soft tissues are unremarkable. IMPRESSION: Negative. Electronically Signed   By: Zerita Boers M.D.   On: 04/30/2020 18:18   DG Swallowing Func-Speech Pathology  Result  Date: 05/04/2020 Objective Swallowing Evaluation: Type of Study: MBS-Modified Barium Swallow Study  Patient Details Name: Adrian Singh MRN: [id number] Date of Birth: 1933-12-17 Today's  Date: 05/04/2020 Time: SLP Start Time (ACUTE ONLY): 0915 -SLP Stop Time (ACUTE ONLY): 0925 SLP Time Calculation (min) (ACUTE ONLY): 10 min Past Medical History: Past Medical History: Diagnosis Date . Arthritis   "legs, back" (10/06/2015) . Atrial fibrillation ([place])   x1 . CAD (coronary artery disease)   nonobstructive . Cancer ([place])   skin cancer on ear (froze it off) and back (cut it off) . Dysrhythmia  . Esophageal reflux   hx of . History of kidney stones  . Hx of heart artery stent  . Hypertrophy of prostate without urinary obstruction and other lower urinary tract symptoms (LUTS)  . Osteoarthrosis, unspecified whether generalized or localized, unspecified site  . Prostate cancer ([place])  . Skin cancer  . Stroke [facility]) 03/2014  "had a series of mini strokes; maybe 4"; denies residual on 10/06/2015 . Thrombocytopenia ([place])  . Type II diabetes mellitus ([place])   type 2 . Unspecified essential hypertension  Past Surgical History: Past Surgical History: Procedure Laterality Date . BACK SURGERY    2001 . CARDIAC CATHETERIZATION  12/2007 . CARDIAC CATHETERIZATION N/A 09/22/2015  Procedure: Left Heart Cath and Coronary Angiography;  Surgeon: Peter M Martinique, MD;  Location: [facility];  Service: Cardiovascular;  Laterality: N/A; . CARDIAC CATHETERIZATION N/A 10/06/2015  Procedure: Coronary Stent Intervention;  Surgeon: Peter M Martinique, MD;  Location: [facility];  Service: Cardiovascular;  Laterality: N/A; . CARDIAC CATHETERIZATION N/A 02/29/2016  Procedure: Left Heart Cath and Coronary Angiography;  Surgeon: Jettie Booze, MD;  Location: [facility];  Service: Cardiovascular;  Laterality: N/A; . COLONOSCOPY W/ BIOPSIES AND POLYPECTOMY   . CORONARY STENT PLACEMENT  10/06/2015  [name]  with DES . EAR CYST EXCISION N/A 04/06/2015  Procedure: EXCISION OF SCALP CYST;  Surgeon: Donnie Mesa, MD;  Location: [facility];  Service: General;  Laterality: N/A; . ESOPHAGOGASTRODUODENOSCOPY (EGD) WITH ESOPHAGEAL DILATION  2001   with dilation . LUMBAR DISC SURGERY  09/26/1999  "cleaned out arthritis and bone spurs" . SHOULDER ARTHROSCOPY W/ ROTATOR CUFF REPAIR Left 2005 . THULIUM LASER TURP (TRANSURETHRAL RESECTION OF PROSTATE) N/A 01/21/2017  Procedure: Marcelino Duster LASER TURP (TRANSURETHRAL RESECTION OF PROSTATE) CAUTERIZATION OF BLADDER LESION;  Surgeon: Franchot Gallo, MD;  Location: [facility];  Service: Urology;  Laterality: N/A; HPI: Pt is an 86 yo male presenting s/p fall with L sided weakness and slurred speech. CXR 8/15 without active disease. MRI showed acute infarct involving the R frontal centrum semi ovale as well as a hyperintense lesion involving the posterior R frontal white matter and R corpus callosum favored to reflect subacute ischemic infarct. PMH includes: HTN, DM, stroke, prostate ca, skin ca, GER, esophageal dilation, CAD, afib. Rapid Response on 8/17 secondary to O2 desauturation to the 80s. Pt was suctioned and placed on NRB. CXR 8/17: Shallow lung inflation with left basilar atelectasis  Subjective: alert, speech is mumbled Assessment / Plan / Recommendation CHL IP CLINICAL IMPRESSIONS 05/04/2020 Clinical Impression Pt demonstrates a severe oropharyngeal dysphagia, likely secondary to a primary esophageal impairment. Pt has relatively good oral phase, but there are instances of slight delay in swallow initiation with frank penetration prior to full laryngeal closure. Of greater concern is the mild vallecular and moderate pyriform residue present consistently with all liquids despite adequate appearing laryngeal elevation, hyoid excursion and base of tongue retraction. Esophageal  sweep shows concern for a significant motility impairement with severe residue and suspected esophageal spasm (no radiologist present to confirm). Poor esophageal peristalsis can impact appropriate negative pressure in the pharynx, which is suspected in this case. Regardless of liquid texture, positional strategies, or compensatory strategies, pt  has  mild penetration after the swallow, leading to eventual silent aspiration. If pt can clear his throat preventatively, he will improve his airway protection. Recommend thin liquids as there is risk across textures. Pt is capable of mech soft orally, but esophageal clearance of solids may be difficult. Recommend pt continue purees for now with thin liquids. Will f/u for tolerance and pt/family education.  SLP Visit Diagnosis Dysphagia, oropharyngeal phase (R13.12) Attention and concentration deficit following -- Frontal lobe and executive function deficit following -- Impact on safety and function Moderate aspiration risk   CHL IP TREATMENT RECOMMENDATION 05/04/2020 Treatment Recommendations Therapy as outlined in treatment plan below   Prognosis 05/01/2020 Prognosis for Safe Diet Advancement Good Barriers to Reach Goals -- Barriers/Prognosis Comment -- CHL IP DIET RECOMMENDATION 05/04/2020 SLP Diet Recommendations -- Liquid Administration via -- Medication Administration Crushed with puree Compensations Clear throat after each swallow;Multiple dry swallows after each bite/sip Postural Changes --   No flowsheet data found.  CHL IP FOLLOW UP RECOMMENDATIONS 05/04/2020 Follow up Recommendations 24 hour supervision/assistance   CHL IP FREQUENCY AND DURATION 05/04/2020 Speech Therapy Frequency (ACUTE ONLY) min 2x/week Treatment Duration 2 weeks      CHL IP ORAL PHASE 05/04/2020 Oral Phase Impaired Oral - Pudding Teaspoon -- Oral - Pudding Cup -- Oral - Honey Teaspoon -- Oral - Honey Cup WFL Oral - Nectar Teaspoon -- Oral - Nectar Cup WFL Oral - Nectar Straw WFL Oral - Thin Teaspoon -- Oral - Thin Cup WFL Oral - Thin Straw WFL Oral - Puree WFL Oral - Mech Soft Delayed oral transit;Reduced posterior propulsion Oral - Regular -- Oral - Multi-Consistency -- Oral - Pill WFL Oral Phase - Comment --  CHL IP PHARYNGEAL PHASE 05/04/2020 Pharyngeal Phase Impaired Pharyngeal- Pudding Teaspoon -- Pharyngeal -- Pharyngeal- Pudding Cup  -- Pharyngeal -- Pharyngeal- Honey Teaspoon -- Pharyngeal -- Pharyngeal- Honey Cup Pharyngeal residue - valleculae;Pharyngeal residue - pyriform;Penetration/Apiration after swallow Pharyngeal Material enters airway, remains ABOVE vocal cords and not ejected out Pharyngeal- Nectar Teaspoon -- Pharyngeal -- Pharyngeal- Nectar Cup Penetration/Apiration after swallow;Penetration/Aspiration during swallow;Pharyngeal residue - valleculae;Pharyngeal residue - pyriform;Trace aspiration Pharyngeal Material enters airway, remains ABOVE vocal cords and not ejected out;Material enters airway, CONTACTS cords and not ejected out;Material enters airway, passes BELOW cords without attempt by patient to eject out (silent aspiration) Pharyngeal- Nectar Straw Penetration/Apiration after swallow;Penetration/Aspiration during swallow;Pharyngeal residue - valleculae;Pharyngeal residue - pyriform;Trace aspiration Pharyngeal Material enters airway, passes BELOW cords without attempt by patient to eject out (silent aspiration);Material enters airway, CONTACTS cords and not ejected out;Material enters airway, remains ABOVE vocal cords and not ejected out Pharyngeal- Thin Teaspoon -- Pharyngeal -- Pharyngeal- Thin Cup Penetration/Apiration after swallow;Penetration/Aspiration during swallow;Pharyngeal residue - valleculae;Pharyngeal residue - pyriform;Trace aspiration Pharyngeal Material enters airway, passes BELOW cords without attempt by patient to eject out (silent aspiration);Material enters airway, CONTACTS cords and not ejected out;Material enters airway, remains ABOVE vocal cords then ejected out;Material enters airway, remains ABOVE vocal cords and not ejected out Pharyngeal- Thin Straw -- Pharyngeal -- Pharyngeal- Puree Pharyngeal residue - valleculae;Pharyngeal residue - pyriform Pharyngeal -- Pharyngeal- Mechanical Soft Pharyngeal residue - valleculae;Pharyngeal residue - pyriform Pharyngeal -- Pharyngeal- Regular -- Pharyngeal --  Pharyngeal- Multi-consistency -- Pharyngeal --  Pharyngeal- Pill -- Pharyngeal -- Pharyngeal Comment --  No flowsheet data found. Herbie Baltimore, MA CCC-SLP Acute Rehabilitation Services Pager [phone] Office [phone] Lynann Beaver 05/04/2020, 11:34 AM              ECHOCARDIOGRAM COMPLETE BUBBLE STUDY  Result Date: 05/01/2020    ECHOCARDIOGRAM REPORT   Patient Name:   Jarious Cherylynn Ridges Date of Exam: 05/01/2020 Medical Rec #:  [id number]      Height:       68.0 in Accession #:    [id number]     Weight:       159.0 lb Date of Birth:  [birth year]     BSA:          1.854 m Patient Age:    43 years       BP:           112/89 mmHg Patient Gender: M              HR:           80 bpm. Exam Location:  Inpatient Procedure: 2D Echo and Saline Contrast Bubble Study Indications:    Stroke 434.91 / I63.9  History:        Patient has prior history of Echocardiogram examinations, most                 recent 02/29/2016. Stroke; Risk Factors:Hypertension, Diabetes                 and Dyslipidemia. Past history of Afib, cancer, coronary                 disease.  Sonographer:    Darlina Sicilian RDCS Referring Phys: [id number]  1. Left ventricular ejection fraction, by estimation, is 50 to 55%. The left ventricle has mildly decreased function. The left ventricle demonstrates regional wall motion abnormalities (see scoring diagram/findings for description). Left ventricular diastolic parameters are indeterminate. There is severe hypokinesis of the left ventricular, entire inferolateral wall. There is moderate hypokinesis of the left ventricular, basal-mid inferior wall.  2. Right ventricular systolic function is normal. The right ventricular size is normal.  3. Left atrial size was mildly dilated.  4. The mitral valve is normal in structure. Mild mitral valve regurgitation.  5. The aortic valve is tricuspid. Aortic valve regurgitation is not visualized. No aortic stenosis is present.  6. Agitated  saline contrast bubble study was negative, with no evidence of any interatrial shunt. Conclusion(s)/Recommendation(s): No intracardiac source of embolism detected on this transthoracic study. A transesophageal echocardiogram is recommended to exclude cardiac source of embolism if clinically indicated. FINDINGS  Left Ventricle: Left ventricular ejection fraction, by estimation, is 50 to 55%. The left ventricle has mildly decreased function. The left ventricle demonstrates regional wall motion abnormalities. Severe hypokinesis of the left ventricular, entire inferolateral wall. Moderate hypokinesis of the left ventricular, basal-mid inferior wall. The left ventricular internal cavity size was normal in size. There is no left ventricular hypertrophy. Left ventricular diastolic parameters are indeterminate. Right Ventricle: The right ventricular size is normal. No increase in right ventricular wall thickness. Right ventricular systolic function is normal. Left Atrium: Left atrial size was mildly dilated. Right Atrium: Right atrial size was normal in size. Pericardium: There is no evidence of pericardial effusion. Mitral Valve: The mitral valve is normal in structure. Mild mitral valve regurgitation. Tricuspid Valve: The tricuspid valve is grossly normal. Tricuspid valve regurgitation is trivial. No evidence of tricuspid stenosis. Aortic  Valve: The aortic valve is tricuspid. Aortic valve regurgitation is not visualized. No aortic stenosis is present. Pulmonic Valve: The pulmonic valve was not well visualized. Pulmonic valve regurgitation is not visualized. Aorta: The aortic arch was not well visualized and the ascending aorta was not well visualized. Venous: The inferior vena cava was not well visualized. IAS/Shunts: No atrial level shunt detected by color flow Doppler. Agitated saline contrast was given intravenously to evaluate for intracardiac shunting. Agitated saline contrast bubble study was negative, with no  evidence of any interatrial shunt.  LEFT VENTRICLE PLAX 2D LVIDd:         3.30 cm  Diastology LVIDs:         2.20 cm  LV e' lateral:   7.88 cm/s LV PW:         1.00 cm  LV E/e' lateral: 7.2 LV IVS:        1.00 cm  LV e' medial:    4.35 cm/s LVOT diam:     1.70 cm  LV E/e' medial:  13.0 LV SV:         39 LV SV Index:   21 LVOT Area:     2.27 cm  RIGHT VENTRICLE RV S prime:     14.80 cm/s TAPSE (M-mode): 2.0 cm LEFT ATRIUM             Index       RIGHT ATRIUM          Index LA diam:        2.80 cm 1.51 cm/m  RA Area:     9.13 cm LA Vol (A2C):   50.0 ml 26.97 ml/m RA Volume:   16.50 ml 8.90 ml/m LA Vol (A4C):   48.8 ml 26.32 ml/m LA Biplane Vol: 52.1 ml 28.10 ml/m  AORTIC VALVE LVOT Vmax:   85.50 cm/s LVOT Vmean:  54.600 cm/s LVOT VTI:    0.170 m  AORTA Ao Root diam: 2.70 cm MITRAL VALVE MV Area (PHT): 3.77 cm    SHUNTS MV Decel Time: 201 msec    Systemic VTI:  0.17 m MV E velocity: 56.60 cm/s  Systemic Diam: 1.70 cm MV A velocity: 93.00 cm/s MV E/A ratio:  0.61 Buford Dresser MD Electronically signed by Buford Dresser MD Signature Date/Time: 05/01/2020/3:44:28 PM    Final    DG Hip Unilat With Pelvis 2-3 Views Left  Result Date: 04/30/2020 CLINICAL DATA:  Patient status post fall.  Leg weakness. EXAM: DG HIP (WITH OR WITHOUT PELVIS) 2-3V LEFT COMPARISON:  None. FINDINGS: Lower lumbar spine degenerative changes. SI joints unremarkable. Pelvic phleboliths. Mild bilateral hip joint degenerative changes. No evidence for acute fracture. IMPRESSION: No acute osseous abnormality. Electronically Signed   By: Lovey Newcomer M.D.   On: 04/30/2020 10:35   DG Hip Unilat W or Wo Pelvis 2-3 Views Right  Result Date: 04/30/2020 CLINICAL DATA:  Fall with hip pain EXAM: DG HIP (WITH OR WITHOUT PELVIS) 2-3V RIGHT COMPARISON:  None. FINDINGS: There is no evidence of hip fracture or dislocation. Degenerative changes are seen in the spine and hips. IMPRESSION: No acute findings. Electronically Signed   By: Zerita Boers M.D.   On: 04/30/2020 18:19   VAS US CAROTID  Result Date: 05/01/2020 Carotid Arterial Duplex Study Indications:       CVA. Risk Factors:      Hypertension, Diabetes, coronary artery disease. Comparison Study:  04/01/2014- 1-39% ICA stenosis bilaterally. Performing Technologist: Maudry Mayhew MHA, RDMS, RVT, RDCS  Examination Guidelines: A complete evaluation includes B-mode imaging, spectral Doppler, color Doppler, and power Doppler as needed of all accessible portions of each vessel. Bilateral testing is considered an integral part of a complete examination. Limited examinations for reoccurring indications may be performed as noted.  Right Carotid Findings: +----------+--------+--------+--------+-----------------------+--------+           PSV cm/sEDV cm/sStenosisPlaque Description     Comments +----------+--------+--------+--------+-----------------------+--------+ CCA Prox  70      12                                              +----------+--------+--------+--------+-----------------------+--------+ CCA Distal69      13                                              +----------+--------+--------+--------+-----------------------+--------+ ICA Prox  195     46      40-59%  heterogenous and smooth         +----------+--------+--------+--------+-----------------------+--------+ ICA Distal90      22                                              +----------+--------+--------+--------+-----------------------+--------+ ECA       71                                                      +----------+--------+--------+--------+-----------------------+--------+ +----------+--------+-------+----------------+-------------------+           PSV cm/sEDV cmsDescribe        Arm Pressure (mmHG) +----------+--------+-------+----------------+-------------------+ Subclavian105            Multiphasic, WNL                     +----------+--------+-------+----------------+-------------------+ +---------+--------+--+--------+--+---------+ VertebralPSV cm/s56EDV cm/s13Antegrade +---------+--------+--+--------+--+---------+  Left Carotid Findings: +----------+--------+--------+--------+--------------------------+--------+           PSV cm/sEDV cm/sStenosisPlaque Description        Comments +----------+--------+--------+--------+--------------------------+--------+ CCA Prox  55      10                                                 +----------+--------+--------+--------+--------------------------+--------+ CCA Distal71      16              smooth and heterogenous            +----------+--------+--------+--------+--------------------------+--------+ ICA Prox  113     22                                                 +----------+--------+--------+--------+--------------------------+--------+ ICA Distal89      30              heterogenous and irregular         +----------+--------+--------+--------+--------------------------+--------+  ECA       57      7                                                  +----------+--------+--------+--------+--------------------------+--------+ +----------+--------+--------+----------------+-------------------+           PSV cm/sEDV cm/sDescribe        Arm Pressure (mmHG) +----------+--------+--------+----------------+-------------------+ [id number]              Multiphasic, WNL                    +----------+--------+--------+----------------+-------------------+ +---------+--------+--+--------+--+---------+ VertebralPSV cm/s66EDV cm/s20Antegrade +---------+--------+--+--------+--+---------+   Summary: Right Carotid: Velocities in the right ICA are consistent with a 40-59%                stenosis. Left Carotid: Velocities in the left ICA are consistent with a 1-39% stenosis. Vertebrals:  Bilateral vertebral arteries demonstrate antegrade  flow. Subclavians: Normal flow hemodynamics were seen in bilateral subclavian              arteries. *See table(s) above for measurements and observations.  Electronically signed by Antony Contras MD on 05/01/2020 at 2:21:54 PM.    Final    CT MAXILLOFACIAL WO CONTRAST  Result Date: 05/02/2020 CLINICAL DATA:  Initial evaluation for acute facial trauma, jaw pain. EXAM: CT MAXILLOFACIAL WITHOUT CONTRAST TECHNIQUE: Multidetector CT imaging of the maxillofacial structures was performed. Multiplanar CT image reconstructions were also generated. COMPARISON:  None available. FINDINGS: Osseous: Zygomatic arches intact. No acute maxillary fracture. Pterygoid plates intact. Nasal bones intact. Trace left-to-right nasal septal deviation without fracture. Mandible intact without fracture or other abnormality. Mandibular condyles are normally situated. Patient is edentulous. Few sclerotic foci in noted within the left lateral mass of C3 as well as the C3 vertebral body, consistent with history of metastatic prostate cancer. Possible additional sclerotic focus noted at the tip of the dens. Orbits: Globes and orbital soft tissues within normal limits. Bony orbits intact. Sinuses: Mild scattered mucoperiosteal thickening present about the ethmoidal air cells and maxillary sinuses. Paranasal sinuses are otherwise clear. No air-fluid levels to suggest acute sinusitis. Visualized mastoids and middle ear cavities are well pneumatized and free of fluid. Probable cerumen noted within the left EAC. Soft tissues: No visible soft tissue swelling or other abnormality about the face. Salivary glands within normal limits. No adenopathy within the visualized neck. Atheromatous plaque noted about the right greater than left carotid bifurcations. Limited intracranial: Previously identified masslike lesion involving the posterior right frontal white matter/corpus callosum faintly visible, better seen on recent MRIs. Previously identified  subcentimeter infarct not well seen. Visualized portions of the brain demonstrate no other new or acute abnormality. IMPRESSION: 1. No acute maxillofacial injury identified. Specifically, no fracture or other abnormality about the mandible. 2. Few sclerotic foci involving the C3 vertebral body and possibly the tip of the dens, consistent with history of metastatic prostate cancer. 3. Previously identified masslike lesion involving the posterior right frontal white matter/corpus callosum faintly visible, better evaluated on recent MRIs. Electronically Signed   By: Jeannine Boga M.D.   On: 05/02/2020 02:36   US Abdomen Limited RUQ  Result Date: 05/10/2020 CLINICAL DATA:  Right upper quadrant abdominal pain EXAM: ULTRASOUND ABDOMEN LIMITED RIGHT UPPER QUADRANT COMPARISON:  None. FINDINGS: Gallbladder: The gallbladder contains layering sludge, however, there is no gallbladder wall thickening,  the gallbladder is not distended, and no pericholecystic fluid is identified. The sonographic Percell Miller sign is reportedly negative. Common bile duct: Diameter: 4 mm proximally. The distal duct is obscured by overlying bowel gas. Liver: No focal lesion identified. Within normal limits in parenchymal echogenicity. Portal vein is patent on color Doppler imaging with normal direction of blood flow towards the liver. Other: No ascites IMPRESSION: Cholelithiasis without sonographic evidence of acute cholecystitis. Electronically Signed   By: Fidela Salisbury MD   On: 05/10/2020 22:39     Subjective: No complaints.  Discharge Exam: Vitals:   05/12/20 0338 05/12/20 0830  BP: 119/75 (!) 142/65  Pulse: 72 77  Resp: 16   Temp: 98.1 F (36.7 C)   SpO2: 98% 100%   Vitals:   05/11/20 2003 05/12/20 0003 05/12/20 0338 05/12/20 0830  BP: (!) 102/53 (!) 106/58 119/75 (!) 142/65  Pulse: 72 63 72 77  Resp: _0 Temp: (!) 97.5 F (36.4 C) 98.4 F (36.9 C) 98.1 F (36.7 C)   TempSrc: Oral Oral Oral   SpO2: 98% 97%  98% 100%  Weight:  72.1 kg    Height:        General: Pt is alert, awake, not in acute distress Cardiovascular: RRR, S1/S2 +, no rubs, no gallops Respiratory: CTA bilaterally, no wheezing, no rhonchi Abdominal: Soft, NT, ND, bowel sounds + Extremities: no edema, no cyanosis    The results of significant diagnostics from this hospitalization (including imaging, microbiology, ancillary and laboratory) are listed below for reference.     Microbiology: No results found for this or any previous visit (from the past 240 hour(s)).   Labs: BNP (last 3 results) No results for input(s): BNP in the last 8760 hours. Basic Metabolic Panel: Recent Labs  Lab 05/06/20 0505 05/06/20 0505 05/07/20 0243 05/08/20 0620 05/09/20 0705 05/10/20 0544 05/11/20 0615  NA 141   < > 140 139 139 138 137  K 3.5   < > 3.2* 3.7 3.3* 3.6 3.9  CL 109   < > 107 107 104 97* 101  CO2 22   < > _1 GLUCOSE 201*   < > 167* 172* 150* 232* 195*  BUN 15   < > _2 CREATININE 0.81   < > 0.82 0.73 0.73 0.71 0.88  CALCIUM 6.8*   < > 7.0* 7.6* 7.8* 8.6* 7.9*  MG 1.7  --  1.7 2.2  --  1.7  --   PHOS 1.9*  --  1.9*  --   --   --   --    < > = values in this interval not displayed.   Liver Function Tests: Recent Labs  Lab 05/07/20 0243 05/10/20 0544 05/11/20 0615  AST _3 ALT _4 ALKPHOS 180* 164* 130*  BILITOT 0.7 0.7 0.6  PROT 6.0* 7.2 6.3*  ALBUMIN 1.6* 1.9* 1.7*   No results for input(s): LIPASE, AMYLASE in the last 168 hours. No results for input(s): AMMONIA in the last 168 hours. CBC: Recent Labs  Lab 05/07/20 0243 05/08/20 0620 05/08/20 2156 05/09/20 0705 05/10/20 0544 05/11/20 0615 05/12/20 0717  WBC 5.7  --   --  5.7 12.5* 15.8* 13.7*  NEUTROABS  --   --   --  3.8  --  13.5* 11.7*  HGB 7.4*   < > 9.0* 9.4* 10.3* 9.4* 8.9*  HCT 24.4*   < > 28.8*  30.3* 33.7* 31.0* 30.2*  MCV 76.0*  --   --  78.3* 77.5* 80.1 80.5  PLT 174  --   --  207 239 208 178   < > =  values in this interval not displayed.   Cardiac Enzymes: No results for input(s): CKTOTAL, CKMB, CKMBINDEX, TROPONINI in the last 168 hours. BNP: Invalid input(s): POCBNP CBG: Recent Labs  Lab 05/11/20 0546 05/11/20 1116 05/11/20 1638 05/11/20 2112 05/12/20 0637  GLUCAP 174* 163* 150* 161* 168*   D-Dimer No results for input(s): DDIMER in the last 72 hours. Hgb A1c No results for input(s): HGBA1C in the last 72 hours. Lipid Profile No results for input(s): CHOL, HDL, LDLCALC, TRIG, CHOLHDL, LDLDIRECT in the last 72 hours. Thyroid function studies No results for input(s): TSH, T4TOTAL, T3FREE, THYROIDAB in the last 72 hours.  Invalid input(s): FREET3 Anemia work up No results for input(s): VITAMINB12, FOLATE, FERRITIN, TIBC, IRON, RETICCTPCT in the last 72 hours. Urinalysis    Component Value Date/Time   COLORURINE AMBER (A) 04/30/2020 1930   APPEARANCEUR HAZY (A) 04/30/2020 1930   LABSPEC 1.021 04/30/2020 1930   PHURINE 5.0 04/30/2020 1930   GLUCOSEU 50 (A) 04/30/2020 1930   HGBUR NEGATIVE 04/30/2020 [place] NEGATIVE 04/30/2020 1930   BILIRUBINUR negative 03/18/2014 [place] 04/30/2020 1930   PROTEINUR 30 (A) 04/30/2020 1930   UROBILINOGEN 1.0 03/31/2014 1228   NITRITE NEGATIVE 04/30/2020 1930   LEUKOCYTESUR NEGATIVE 04/30/2020 1930   Sepsis Labs Invalid input(s): PROCALCITONIN,  WBC,  LACTICIDVEN Microbiology No results found for this or any previous visit (from the past 240 hour(s)).   Time coordinating discharge: Over 30 minutes  SIGNED:   Charlynne Cousins, MD  [facility] 05/12/2020, 9:49 AM Pager   If 7PM-7AM, please contact night-coverage [email or web address] Password [email or web address]

## 2020-05-12 NOTE — Progress Notes (Signed)
Report was given to [place], [place].

## 2020-05-12 NOTE — TOC Transition Note (Signed)
Transition of Care [facility] - CM/SW Discharge Note   Patient Details  Name: Adrian Singh MRN: [id number] Date of Birth: December 29, 1933  Transition of Care [facility]) CM/SW Contact:  [address], [place] Phone Number: 05/12/2020, 12:44 PM   Clinical Narrative:    Patient will DC to: [place]  Anticipated DC date: 05/12/20 Family notified: Shelda Jakes  Transport by: Corey Harold   Per MD patient ready for DC to [facility]. RN to call report prior to discharge ([phone]). RN, patient, patient's family, and facility notified of DC. Discharge Summary and FL2 sent to facility. DC packet on chart. Ambulance transport requested for patient.   CSW will sign off for now as social work intervention is no longer needed. Please consult [place] again if new needs arise.     Final next level of care: Skilled Nursing Facility Barriers to Discharge: Barriers Resolved   Patient Goals and CMS Choice Patient states their goals for this hospitalization and ongoing recovery are:: To get better CMS Medicare.gov Compare Post Acute Care list provided to:: Patient Choice offered to / list presented to : Patient, Adult Children (Son Richardson Landry)  Discharge Placement PASRR number recieved: 05/09/20            Patient chooses bed at: [facility] Patient to be transferred to facility by: [facility] Name of family member notified: Shelda Jakes [phone] Patient and family notified of of transfer: 05/12/20  Discharge Plan and Services In-house Referral: Clinical Social Work   Post Acute Care Choice: [place]                               Social Determinants of Health (SDOH) Interventions     Readmission Risk Interventions No flowsheet data found.

## 2020-05-12 NOTE — Progress Notes (Signed)
  Speech Language Pathology Treatment: Dysphagia  Patient Details Name: Adrian Singh MRN: [id number] DOB: May 08, 1934 Today's Date: 05/12/2020 Time: 1050-1105 SLP Time Calculation (min) (ACUTE ONLY): 15 min  Assessment / Plan / Recommendation Clinical Impression  Pt was seen for dysphagia treatment with his daughter present. She reported that the pt has been tolerating the current diet without overt s/sx of aspiration and requires reduced cueing for use of throat clearing. Pt and his daughter indicated that the pt is tired of puree. He tolerated trials of dysphagia 2 solids and thin liquids via straw without overt s/sx of aspiration. Pt attempted to wear dentures but they were removed due to c/o pain. Mastication time was [facility] despite edentulous status. Pt stated that his jaw "locked up like it does" and that he needed to apply pressure to alleviate this and could not open his mouth more than necessary for speech. Dysphagia 3 solids were therefore deferred. A dysphagia 2 diet with thin liquids is recommended at this time. He currently has discharge orders and it is recommended that he follow up with SLP following discharge.    HPI HPI: Pt is an 86 yo male presenting s/p fall with L sided weakness and slurred speech. CXR 8/15 without active disease. MRI showed acute infarct involving the R frontal centrum semi ovale as well as a hyperintense lesion involving the posterior R frontal white matter and R corpus callosum favored to reflect subacute ischemic infarct. PMH includes: HTN, DM, stroke, prostate ca, skin ca, GER, esophageal dilation, CAD, afib. Rapid Response on 8/17 secondary to O2 desauturation to the 80s. Pt was suctioned and placed on NRB. CXR 8/17: Shallow lung inflation with left basilar atelectasis      SLP Plan  Continue with current plan of care       Recommendations  Diet recommendations: Dysphagia 2 (fine chop);Thin liquid Liquids provided via: Straw;Cup;Teaspoon Medication  Administration: Crushed with puree Supervision: Full supervision/cueing for compensatory strategies;Staff to assist with self feeding Compensations: Minimize environmental distractions;Slow rate;Small sips/bites;Follow solids with liquid;Clear throat intermittently Postural Changes and/or Swallow Maneuvers: Seated upright 90 degrees;Upright 30-60 min after meal                Oral Care Recommendations: Oral care BID Follow up Recommendations: Other (comment) (TBA) SLP Visit Diagnosis: Dysphagia, oropharyngeal phase (R13.12) Plan: Continue with current plan of care       Kaymen Adrian I. Hardin Negus, [place], [place] Office number [phone] Pager [place] 05/12/2020, 11:12 AM

## 2020-05-13 DIAGNOSIS — C61 Malignant neoplasm of prostate: Secondary | ICD-10-CM | POA: Diagnosis not present

## 2020-05-13 DIAGNOSIS — R262 Difficulty in walking, not elsewhere classified: Secondary | ICD-10-CM | POA: Diagnosis not present

## 2020-05-13 DIAGNOSIS — I69354 Hemiplegia and hemiparesis following cerebral infarction affecting left non-dominant side: Secondary | ICD-10-CM | POA: Diagnosis not present

## 2020-05-13 DIAGNOSIS — C7951 Secondary malignant neoplasm of bone: Secondary | ICD-10-CM | POA: Diagnosis not present

## 2020-05-17 DIAGNOSIS — N1831 Chronic kidney disease, stage 3a: Secondary | ICD-10-CM | POA: Diagnosis not present

## 2020-05-17 DIAGNOSIS — R262 Difficulty in walking, not elsewhere classified: Secondary | ICD-10-CM | POA: Diagnosis not present

## 2020-05-17 DIAGNOSIS — C61 Malignant neoplasm of prostate: Secondary | ICD-10-CM | POA: Diagnosis not present

## 2020-05-17 DIAGNOSIS — I69354 Hemiplegia and hemiparesis following cerebral infarction affecting left non-dominant side: Secondary | ICD-10-CM | POA: Diagnosis not present

## 2020-05-25 ENCOUNTER — Telehealth: Payer: Self-pay | Admitting: Internal Medicine

## 2020-05-25 NOTE — Telephone Encounter (Signed)
Adrian Singh called schedule follow up appointment with you tomorrow.  During covid questions she stated pt was dx with pneumonia at the end of aug.   Is it ok for pt to come in office?

## 2020-05-25 NOTE — Telephone Encounter (Signed)
Yes it is okay for in office

## 2020-05-25 NOTE — Telephone Encounter (Signed)
[name] called back and I informed her of Dr. Silvio Pate response to come in.

## 2020-05-26 ENCOUNTER — Ambulatory Visit: Payer: Medicare Other | Admitting: Internal Medicine

## 2020-05-26 NOTE — Telephone Encounter (Signed)
[facility] Night - Client TELEPHONE ADVICE RECORD [facility] Patient Name: Adrian Singh Gender: Male DOB: 01-14-1934 Age: 86 Y 9 M 23 D Return Phone Number: [phone] (Primary) Address: City/State/Zip: [place] [address] Client [facility] Night - Client Client Site [place] Physician Viviana Simpler- MD Contact Type Call Who Is Calling Patient / Member / Family / Caregiver Call Type Triage / Clinical Relationship To Patient Self Return Phone Number [phone] (Primary) Chief Complaint Blood Pressure Low Reason for Call Symptomatic / Request for Adrian Singh states he is having low blood pressure. The caller has an appointment with Dr. Silvio Pate today at 200pm. Caller is at [facility] in [place]. Caller would rather see Dr. Silvio Pate. What should the caller do? Caller cannot walk very well. Translation No Nurse Assessment Nurse: Zenia Resides, RN, Diane Date/Time Eilene Ghazi Time): 05/26/2020 7:58:41 AM Confirm and document reason for call. If symptomatic, describe symptoms. ---Caller states when he sits up or stands up, his BP drops and he feels weak. Also, happens when he is doing PT. BP as low as the 42'P and 53'I systolic. Pt. is wobbly when he gets up. Pt. is drinking fluids. Pt. is still taking BP meds. Has the patient had close contact with a person known or suspected to have the novel coronavirus illness OR traveled / lives in area with major community spread (including international travel) in the last 14 days from the onset of symptoms? * If Asymptomatic, screen for exposure and travel within the last 14 days. ---No Does the patient have any new or worsening symptoms? ---Yes Will a triage be completed? ---Yes Related visit to physician within the last 2 weeks? ---No Does the PT have any chronic conditions? (i.e. diabetes, asthma, this includes High risk  factors for pregnancy, etc.) ---Yes List chronic conditions. ---HTN, DM, bone and prostate CA, irregular HR, heart stint Is this a behavioral health or substance abuse call? ---No Guidelines Guideline Title Affirmed Question Affirmed Notes Nurse Date/Time (Eastern Time) Blood Pressure - Low [1] Fall in systolic BP > 20 mm Hg from [place], RN, Diane 05/26/2020 8:04:25 AM PLEASE NOTE: All timestamps contained within this report are represented as [place] Standard Time. CONFIDENTIALTY NOTICE: This fax transmission is intended only for the addressee. It contains information that is legally privileged, confidential or otherwise protected from use or disclosure. If you are not the intended recipient, you are strictly prohibited from reviewing, disclosing, copying using or disseminating any of this information or taking any action in reliance on or regarding this information. If you have received this fax in error, please notify us immediately by telephone so that we can arrange for its return to [place]. Phone: [phone], Toll-Free: [phone], Fax: [phone] Page: 2 of 2 Call Id: [id number] Guidelines Guideline Title Affirmed Question Affirmed Notes Nurse Date/Time Eilene Ghazi Time) normal AND [2] dizzy, lightheaded, or weak Disp. Time Eilene Ghazi Time) Disposition Final User 05/26/2020 8:07:04 AM Go to ED Now (or PCP triage) Yes Zenia Resides, RN, Diane Caller Disagree/Comply Comply Caller Understands Yes PreDisposition Call Doctor Care Advice Given Per Guideline GO TO ED NOW (OR PCP TRIAGE): ANOTHER ADULT SHOULD DRIVE: * It is better and safer if another adult drives instead of you. CARE ADVICE given per Low Blood Pressure (Adult) guideline. Comments User: Hildred Priest, RN Date/Time Eilene Ghazi Time): 05/26/2020 8:02:46 AM Symptoms since last week Referrals GO TO FACILITY UNDECIDED

## 2020-05-26 NOTE — Telephone Encounter (Signed)
Pt said [facility] will not send pt to ED without OK from [facility]. I spoke with Tish, pts nurse at [facility] rehab for today and she said she had spoken with pt this morning and asked if he knew where he was and pt answered appropriately.Tish has had no other interaction with pt this morning. I explained that pt said he could not come for appt. Today at 2 PM with Dr Silvio Pate because he does not have transportation. Tish said they can arrange transportation but it will take more than 1 -2 days to get pt on transportation list.  appt today with Dr Silvio Pate cancelled and pt voiced understanding. Tish cked pts chart and BP on 05/24/20 was 141/66 and BP on 05/25/20 was 138/70. Tish cked chart and could not find any other BPs recorded by PT. Tish said that she could have the staff doctor for [facility] see pt today and if condition were to change or worsen she would call 911 to take pt to ED.pt  notified of this info and voiced understanding.  Pt said now he feels off balance when he gets up but no other symptoms at this time. I advised pt not to get up without assistance from an aide or nurse and pt voiced understanding.FYI to Dr Silvio Pate.

## 2020-05-26 NOTE — Telephone Encounter (Signed)
He should be seen by the physician or NP at [facility]----and only set up with me again once he goes home

## 2020-05-27 NOTE — Telephone Encounter (Signed)
Shelda Jakes (daughter) called Triage still afraid of pt's having low BP readings she said she was told his BP was 60s/32 and she is very worried about pt and no one has checked on him. I asked if she has called the rehab center and relayed her concerns. [name] said she has called "multiple times" and "no one answers the phone".  I called the rehab center from the # she gave me [phone] and someone answered immediately, I spoke with Maudie Mercury she advise me that they are not aware of any low BP reading that low and asked if pt's daughter has any concerns to have her call them directly. Called [name] back and advise her of this and told her to call back now and ask for Maudie Mercury and she will help her going forward  FYI to PCP

## 2020-05-28 NOTE — Telephone Encounter (Signed)
Okay Thanks for handling this. I will just plan to see him once he leaves the rehab facility

## 2020-06-01 DIAGNOSIS — I480 Paroxysmal atrial fibrillation: Secondary | ICD-10-CM | POA: Diagnosis not present

## 2020-06-01 DIAGNOSIS — M1990 Unspecified osteoarthritis, unspecified site: Secondary | ICD-10-CM | POA: Diagnosis not present

## 2020-06-01 DIAGNOSIS — E43 Unspecified severe protein-calorie malnutrition: Secondary | ICD-10-CM | POA: Diagnosis not present

## 2020-06-01 DIAGNOSIS — R627 Adult failure to thrive: Secondary | ICD-10-CM | POA: Diagnosis not present

## 2020-06-02 ENCOUNTER — Telehealth: Payer: Self-pay

## 2020-06-02 NOTE — Telephone Encounter (Signed)
Okay to add Tuesday at 12 or Wednesday at 12:30

## 2020-06-02 NOTE — Telephone Encounter (Signed)
Patient's wife contacted the office and states patient is being discharged from rehab facility on Friday. She would really like patient to be seen sometime next week for this follow up. Dr. Alla German schedule is full next week - but is there any way we can add this patient on sometime next week for a rehab discharge f/u? Please advise.

## 2020-06-02 NOTE — Telephone Encounter (Signed)
Okay thanks for the update 

## 2020-06-02 NOTE — Telephone Encounter (Signed)
I called Shirlee Limerick to make appointment.  She said the rehab is letting patient stay longer and she's not sure when he'll be discharged.  I asked her to call us back when he's discharged and we'll work him in.

## 2020-06-03 NOTE — Progress Notes (Deleted)
Adrian Singh Date of Birth: Jan 03, 1934   History of Present Illness: Adrian Singh is seen today for follow up CAD.   He has a history of orthostatic dizziness. He was admitted in July 2836 with an embolic stroke felt to be due to paroxysmal Afib. He was anticoagulated with Eliquis. The patient developed tachycardia and HA on Eliquis that resolved when he was switched to Xarelto. Previous orthostatic dizziness improved with stopping diuretic and alpha blockers he was taking for enlarged prostate.   He was seen in the office on 08/10/2015 with shortness of breath with exertion. He had outpatient stress test done on 08/25/2015 which showed EF mildly decreased 45-54%, no significant ST elevation or depression during stress portion, overall considered low risk study, there was prior myocardial infarction with peri-infarct ischemia. The peri-infarct ischemia was felt to be new compared to the previous stress test, therefore outpatient cardiac catheterization was arranged. He underwent planned study on 09/22/2015 which showed 30% mid LAD lesion, 99% mid to distal left circumflex lesion, 50% OM 3 lesion. Due to poor support from right radial approach and concern for renal function, it was planned for staged PCI at a later time. He was admitted to [facility] on 10/06/15 for staged PCI/DES to mid LCx lesion that wa 99% stenosed. There was residual 30% mid LAD stenosis to be treated medically.  He was admitted in June 2017 with chest pain. Cardiac cath showed no new disease and prior stent was patent. Later when seen as an outpatient he developed orthostatic dizziness. maxzide was stopped and metoprolol was reduced with improvement.  When seen in 2017 he reported marked worsening of orthostatic dizziness.  He was back on Maxide and also taking Flomax for obstructive urinary symptoms. We stopped both of these medications and referred him to urology. He underwent laser TURP on 02/16/93 without complication. He has been  diagnosed with metastatic prostate CA to bone and is now receiving hormonal therapy with Zytiga. He did not tolerate this well and he had significant weight loss with this. Now getting hormonal therapy.   He was admitted 8/14/-05/12/20 after a mechanical fall and  stroke with left hemiparesis and slurred speech. Initial CT scan of the head showed generalized cerebral atrophy.  MRI was done which showed 5 mm right frontal  centrum semi ovale infarct as well as 1.5-2cm lesion involving R corpus callosum,infarct. Echo showed no source of emboli with EF 50% with infeiror HK, mild MR.   On follow up today he states he really feels great from a cardiac standpoint. No chest pain or dyspnea. Energy level is OK.  He does have some diarrhea. He only has dizzy spells if he stands too quickly. No edema.       Current Outpatient Medications on File Prior to Visit  Medication Sig Dispense Refill  . acetaminophen (TYLENOL) 650 MG CR tablet Take 650 mg by mouth 2 (two) times daily as needed for pain.    . Ascorbic Acid (VITAMIN C) 1000 MG tablet Take 1,000 mg by mouth daily.    Marland Kitchen atorvastatin (LIPITOR) 40 MG tablet Take 1/2 (one-half) tablet by mouth once daily (Patient taking differently: Take 20 mg by mouth daily. ) 45 tablet 3  . b complex vitamins capsule Take 1 capsule by mouth daily.    Marland Kitchen BAYER MICROLET LANCETS lancets Use to test blood sugar once daily dx: E11.40 100 each 1  . Blood Glucose Monitoring Suppl (CONTOUR NEXT EZ MONITOR) w/Device KIT USE TO TEST BLOOD  SUGAR ONCE DAILY. Dx Code E11.40 1 kit 0  . calcium carbonate (CALCIUM 600) 1500 (600 Ca) MG TABS tablet Take 600 mg of elemental calcium by mouth daily with breakfast.     . Cholecalciferol (VITAMIN D3) 1.25 MG (50000 UT) CAPS Take 50,000 Units by mouth once a week. Saturday    . cyanocobalamin 1000 MCG tablet Take 1,000 mg by mouth at bedtime.     . docusate sodium (COLACE) 250 MG capsule Take 250 mg by mouth daily as needed for constipation.      Marland Kitchen doxazosin (CARDURA) 1 MG tablet Take 1 tablet (1 mg total) by mouth at bedtime.    . furosemide (LASIX) 40 MG tablet Take 1 tablet (40 mg total) by mouth daily as needed. 15 tablet 0  . glimepiride (AMARYL) 4 MG tablet Take 1 tablet by mouth twice daily (Patient taking differently: Take 4 mg by mouth in the morning and at bedtime. ) 180 tablet 3  . glucose blood (CONTOUR NEXT TEST) test strip Check blood sugar once daily and as directed.Dx Code E11.40 100 each 1  . metFORMIN (GLUCOPHAGE) 500 MG tablet Take 1 tablet (500 mg total) by mouth daily with breakfast. 270 tablet 3  . metoprolol succinate (TOPROL-XL) 100 MG 24 hr tablet Take 50 mg by mouth at bedtime. Take with or immediately following a meal.    . Multiple Vitamin (MULTIVITAMIN WITH MINERALS) TABS tablet Take 1 tablet by mouth daily. ONE-A-DAY FOR MEN    . nitroGLYCERIN (NITROSTAT) 0.4 MG SL tablet Place 1 tablet (0.4 mg total) under the tongue every 5 (five) minutes as needed for chest pain. 25 tablet 0  . OVER THE COUNTER MEDICATION Take 15 mLs by mouth in the morning and at bedtime. Bio-Cell    . ramipril (ALTACE) 10 MG capsule Take 1 capsule (10 mg total) by mouth daily. 90 capsule 1  . rivaroxaban (XARELTO) 20 MG TABS tablet Take 1 tablet (20 mg total) by mouth daily with supper. 90 tablet 3  . tamsulosin (FLOMAX) 0.4 MG CAPS capsule Take 1 capsule (0.4 mg total) by mouth daily. (Patient taking differently: Take 0.4 mg by mouth daily after supper. ) 90 capsule 3  . traMADol (ULTRAM) 50 MG tablet TAKE 1 TABLET BY MOUTH THREE TIMES DAILY AS NEEDED (Patient taking differently: Take 50 mg by mouth 2 (two) times daily as needed for moderate pain. ) 30 tablet 0  . XTANDI 40 MG capsule Take 80 mg by mouth daily.      No current facility-administered medications on file prior to visit.    Allergies  Allergen Reactions  . Soma [Carisoprodol] Other (See Comments)    "did a number on me"  . Doxazosin Other (See Comments)    dizziness     Past Medical History:  Diagnosis Date  . Arthritis    "legs, back" (10/06/2015)  . Atrial fibrillation ([place])    x1  . CAD (coronary artery disease)    nonobstructive  . Cancer ([place])    skin cancer on ear (froze it off) and back (cut it off)  . Dysrhythmia   . Esophageal reflux    hx of  . History of kidney stones   . Hx of heart artery stent   . Hypertrophy of prostate without urinary obstruction and other lower urinary tract symptoms (LUTS)   . Osteoarthrosis, unspecified whether generalized or localized, unspecified site   . Prostate cancer ([place])   . Skin cancer   . Stroke [facility])  03/2014   "had a series of mini strokes; maybe 4"; denies residual on 10/06/2015  . Thrombocytopenia ([place])   . Type II diabetes mellitus ([place])    type 2  . Unspecified essential hypertension     Past Surgical History:  Procedure Laterality Date  . BACK SURGERY     2001  . CARDIAC CATHETERIZATION  12/2007  . CARDIAC CATHETERIZATION N/A 09/22/2015   Procedure: Left Heart Cath and Coronary Angiography;  Surgeon: Manaal Mandala M Martinique, MD;  Location: [place] [facility];  Service: Cardiovascular;  Laterality: N/A;  . CARDIAC CATHETERIZATION N/A 10/06/2015   Procedure: Coronary Stent Intervention;  Surgeon: Mayrene Bastarache M [place], MD;  Location: [place] [facility];  Service: Cardiovascular;  Laterality: N/A;  . CARDIAC CATHETERIZATION N/A 02/29/2016   Procedure: Left Heart Cath and Coronary Angiography;  Surgeon: Jettie Booze, MD;  Location: [place] [facility];  Service: Cardiovascular;  Laterality: N/A;  . COLONOSCOPY W/ BIOPSIES AND POLYPECTOMY    . CORONARY STENT PLACEMENT  10/06/2015   [name]  with DES  . EAR CYST EXCISION N/A 04/06/2015   Procedure: EXCISION OF SCALP CYST;  Surgeon: Donnie Mesa, MD;  Location: [place];  Service: General;  Laterality: N/A;  . ESOPHAGOGASTRODUODENOSCOPY (EGD) WITH ESOPHAGEAL DILATION  2001   with dilation  . LUMBAR DISC SURGERY  09/26/1999   "cleaned out arthritis and bone spurs"   . SHOULDER ARTHROSCOPY W/ ROTATOR CUFF REPAIR Left 2005  . THULIUM LASER TURP (TRANSURETHRAL RESECTION OF PROSTATE) N/A 01/21/2017   Procedure: Marcelino Duster LASER TURP (TRANSURETHRAL RESECTION OF PROSTATE) CAUTERIZATION OF BLADDER LESION;  Surgeon: Franchot Gallo, MD;  Location: [facility];  Service: Urology;  Laterality: N/A;    Social History   Tobacco Use  Smoking Status Former Smoker  . Years: 3.00  . Types: Cigarettes  Smokeless Tobacco Never Used  Tobacco Comment   " Quit smoking by age 40; was a someday smoker "    Social History   Substance and Sexual Activity  Alcohol Use No    Family History  Problem Relation Age of Onset  . Stroke Father   . Peripheral vascular disease Father        amputation  . Heart failure Mother        CHF  . Coronary artery disease Mother   . Heart attack Mother 15       Multiple  . COPD Brother   . Heart disease Brother   . Cancer Brother        Bone  . Lung cancer Sister     Review of Systems: The review of systems is as noted in HPI.  All other systems were reviewed and are negative.  Physical Exam: There were no vitals taken for this visit.  GENERAL:  Well appearing  WM in NAD HEENT:  PERRL, EOMI, sclera are clear. Oropharynx is clear. NECK:  No jugular venous distention, carotid upstroke brisk and symmetric, no bruits, no thyromegaly or adenopathy LUNGS:  Clear to auscultation bilaterally CHEST:  Tender to palpatation in the left chest and lateral ribs. HEART:  RRR,  PMI not displaced or sustained,S1 and S2 within normal limits, no S3, no S4: no clicks, no rubs, no murmurs ABD:  Soft, nontender. BS +, no masses or bruits. No hepatomegaly, no splenomegaly EXT:  2 + pulses throughout, no edema, no cyanosis no clubbing SKIN:  Warm and dry.  No rashes NEURO:  Alert and oriented x 3. Cranial nerves II through XII intact. PSYCH:  Cognitively intact    LABORATORY DATA: Lab Results  Component Value Date   WBC 13.7 (H) 05/12/2020    HGB 8.9 (L) 05/12/2020   HCT 30.2 (L) 05/12/2020   PLT 178 05/12/2020   GLUCOSE 195 (H) 05/11/2020   CHOL 83 11/14/2017   TRIG 199.0 (H) 11/14/2017   HDL 32.50 (L) 11/14/2017   LDLDIRECT 77.0 03/28/2015   LDLCALC 11 11/14/2017   ALT 15 05/11/2020   AST 20 05/11/2020   NA 137 05/11/2020   K 3.9 05/11/2020   CL 101 05/11/2020   CREATININE 0.88 05/11/2020   BUN 22 05/11/2020   CO2 26 05/11/2020   TSH 1.164 04/30/2020   PSA 217.15 verified by manual dilution (H) 04/29/2020   INR 1.4 (H) 04/30/2020   HGBA1C 7.5 (H) 04/30/2020   MICROALBUR 17.2 (H) 05/23/2012   Echo 05/01/20: IMPRESSIONS    1. Left ventricular ejection fraction, by estimation, is 50 to 55%. The  left ventricle has mildly decreased function. The left ventricle  demonstrates regional wall motion abnormalities (see scoring  diagram/findings for description). Left ventricular  diastolic parameters are indeterminate. There is severe hypokinesis of the  left ventricular, entire inferolateral wall. There is moderate hypokinesis  of the left ventricular, basal-mid inferior wall.  2. Right ventricular systolic function is normal. The right ventricular  size is normal.  3. Left atrial size was mildly dilated.  4. The mitral valve is normal in structure. Mild mitral valve  regurgitation.  5. The aortic valve is tricuspid. Aortic valve regurgitation is not  visualized. No aortic stenosis is present.  6. Agitated saline contrast bubble study was negative, with no evidence  of any interatrial shunt.   Conclusion(s)/Recommendation(s): No intracardiac source of embolism  detected on this transthoracic study. A transesophageal echocardiogram is  recommended to exclude cardiac source of embolism if clinically indicated.   Assessment / Plan: 1. Orthostatic hypotension. . It is exacerbated by medication- especially diuretics and Flomax. He apparently still on Flomax but has not taken lasix.   2. CAD s/p DES of mid LCx  in January 2017. He is asymptomatic. Repeat cardiac cath in June 2017 showed continued patency. Off antiplatelet therapy. Continue Xarelto.  3. Stage IV prostate CA with bony metastases. Followed by oncology and urology.   4. CKD stage 3.   5. Paroxysmal Afib. Continue metoprolol and Xarelto. Asymptomatic.  6. History of recent CVA.  7. DM per  Dr Silvio Pate.  8. HTN mildly elevated today. Will monitor  9. Hypercholesterolemia on lipitor. Will add lipid panel to next blood draw.     I will follow up in 6 months.

## 2020-06-06 ENCOUNTER — Ambulatory Visit: Payer: Medicare Other | Admitting: Cardiology

## 2020-06-09 ENCOUNTER — Inpatient Hospital Stay (HOSPITAL_COMMUNITY)
Admission: EM | Admit: 2020-06-09 | Discharge: 2020-06-13 | DRG: 312 | Disposition: A | Discharge status: Skilled Nursing Facility | Payer: Medicare Other | Attending: Internal Medicine | Admitting: Internal Medicine

## 2020-06-09 ENCOUNTER — Emergency Department (HOSPITAL_COMMUNITY): Payer: Medicare Other

## 2020-06-09 ENCOUNTER — Encounter (HOSPITAL_COMMUNITY): Payer: Self-pay | Admitting: Emergency Medicine

## 2020-06-09 ENCOUNTER — Other Ambulatory Visit: Payer: Self-pay

## 2020-06-09 DIAGNOSIS — N1831 Chronic kidney disease, stage 3a: Secondary | ICD-10-CM | POA: Diagnosis present

## 2020-06-09 DIAGNOSIS — R55 Syncope and collapse: Secondary | ICD-10-CM | POA: Diagnosis not present

## 2020-06-09 DIAGNOSIS — I129 Hypertensive chronic kidney disease with stage 1 through stage 4 chronic kidney disease, or unspecified chronic kidney disease: Secondary | ICD-10-CM | POA: Diagnosis not present

## 2020-06-09 DIAGNOSIS — Z85828 Personal history of other malignant neoplasm of skin: Secondary | ICD-10-CM

## 2020-06-09 DIAGNOSIS — I2510 Atherosclerotic heart disease of native coronary artery without angina pectoris: Secondary | ICD-10-CM | POA: Diagnosis present

## 2020-06-09 DIAGNOSIS — R627 Adult failure to thrive: Secondary | ICD-10-CM | POA: Diagnosis not present

## 2020-06-09 DIAGNOSIS — C61 Malignant neoplasm of prostate: Secondary | ICD-10-CM | POA: Diagnosis not present

## 2020-06-09 DIAGNOSIS — Z87891 Personal history of nicotine dependence: Secondary | ICD-10-CM | POA: Diagnosis not present

## 2020-06-09 DIAGNOSIS — I472 Ventricular tachycardia: Secondary | ICD-10-CM | POA: Diagnosis not present

## 2020-06-09 DIAGNOSIS — Z20822 Contact with and (suspected) exposure to covid-19: Secondary | ICD-10-CM | POA: Diagnosis not present

## 2020-06-09 DIAGNOSIS — I480 Paroxysmal atrial fibrillation: Secondary | ICD-10-CM | POA: Diagnosis not present

## 2020-06-09 DIAGNOSIS — E43 Unspecified severe protein-calorie malnutrition: Secondary | ICD-10-CM | POA: Diagnosis not present

## 2020-06-09 DIAGNOSIS — I951 Orthostatic hypotension: Principal | ICD-10-CM | POA: Diagnosis present

## 2020-06-09 DIAGNOSIS — R1312 Dysphagia, oropharyngeal phase: Secondary | ICD-10-CM | POA: Diagnosis not present

## 2020-06-09 DIAGNOSIS — I10 Essential (primary) hypertension: Secondary | ICD-10-CM | POA: Diagnosis present

## 2020-06-09 DIAGNOSIS — L89151 Pressure ulcer of sacral region, stage 1: Secondary | ICD-10-CM | POA: Diagnosis present

## 2020-06-09 DIAGNOSIS — Z66 Do not resuscitate: Secondary | ICD-10-CM | POA: Diagnosis not present

## 2020-06-09 DIAGNOSIS — Z8673 Personal history of transient ischemic attack (TIA), and cerebral infarction without residual deficits: Secondary | ICD-10-CM

## 2020-06-09 DIAGNOSIS — Z7901 Long term (current) use of anticoagulants: Secondary | ICD-10-CM | POA: Diagnosis not present

## 2020-06-09 DIAGNOSIS — Z955 Presence of coronary angioplasty implant and graft: Secondary | ICD-10-CM

## 2020-06-09 DIAGNOSIS — E1142 Type 2 diabetes mellitus with diabetic polyneuropathy: Secondary | ICD-10-CM | POA: Diagnosis not present

## 2020-06-09 DIAGNOSIS — Z8249 Family history of ischemic heart disease and other diseases of the circulatory system: Secondary | ICD-10-CM | POA: Diagnosis not present

## 2020-06-09 DIAGNOSIS — E1165 Type 2 diabetes mellitus with hyperglycemia: Secondary | ICD-10-CM | POA: Diagnosis present

## 2020-06-09 DIAGNOSIS — E1122 Type 2 diabetes mellitus with diabetic chronic kidney disease: Secondary | ICD-10-CM | POA: Diagnosis present

## 2020-06-09 DIAGNOSIS — Z79899 Other long term (current) drug therapy: Secondary | ICD-10-CM

## 2020-06-09 DIAGNOSIS — Z87442 Personal history of urinary calculi: Secondary | ICD-10-CM

## 2020-06-09 DIAGNOSIS — D696 Thrombocytopenia, unspecified: Secondary | ICD-10-CM | POA: Diagnosis not present

## 2020-06-09 DIAGNOSIS — Z825 Family history of asthma and other chronic lower respiratory diseases: Secondary | ICD-10-CM

## 2020-06-09 DIAGNOSIS — K219 Gastro-esophageal reflux disease without esophagitis: Secondary | ICD-10-CM | POA: Diagnosis present

## 2020-06-09 DIAGNOSIS — Z888 Allergy status to other drugs, medicaments and biological substances status: Secondary | ICD-10-CM | POA: Diagnosis not present

## 2020-06-09 DIAGNOSIS — I25119 Atherosclerotic heart disease of native coronary artery with unspecified angina pectoris: Secondary | ICD-10-CM | POA: Diagnosis not present

## 2020-06-09 DIAGNOSIS — D6859 Other primary thrombophilia: Secondary | ICD-10-CM | POA: Diagnosis not present

## 2020-06-09 DIAGNOSIS — Z801 Family history of malignant neoplasm of trachea, bronchus and lung: Secondary | ICD-10-CM

## 2020-06-09 DIAGNOSIS — R42 Dizziness and giddiness: Secondary | ICD-10-CM

## 2020-06-09 DIAGNOSIS — C7951 Secondary malignant neoplasm of bone: Secondary | ICD-10-CM | POA: Diagnosis present

## 2020-06-09 DIAGNOSIS — IMO0002 Reserved for concepts with insufficient information to code with codable children: Secondary | ICD-10-CM | POA: Diagnosis present

## 2020-06-09 DIAGNOSIS — Z823 Family history of stroke: Secondary | ICD-10-CM

## 2020-06-09 DIAGNOSIS — E559 Vitamin D deficiency, unspecified: Secondary | ICD-10-CM | POA: Diagnosis not present

## 2020-06-09 DIAGNOSIS — D649 Anemia, unspecified: Secondary | ICD-10-CM | POA: Diagnosis not present

## 2020-06-09 DIAGNOSIS — Z7984 Long term (current) use of oral hypoglycemic drugs: Secondary | ICD-10-CM

## 2020-06-09 DIAGNOSIS — M6281 Muscle weakness (generalized): Secondary | ICD-10-CM | POA: Diagnosis not present

## 2020-06-09 LAB — CBC WITH DIFFERENTIAL/PLATELET
Abs Immature Granulocytes: 0.31 10*3/uL — ABNORMAL HIGH (ref 0.00–0.07)
Basophils Absolute: 0.1 10*3/uL (ref 0.0–0.1)
Basophils Relative: 1 %
Eosinophils Absolute: 0 10*3/uL (ref 0.0–0.5)
Eosinophils Relative: 0 %
HCT: 33.2 % — ABNORMAL LOW (ref 39.0–52.0)
Hemoglobin: 10 g/dL — ABNORMAL LOW (ref 13.0–17.0)
Immature Granulocytes: 5 %
Lymphocytes Relative: 15 %
Lymphs Abs: 0.9 10*3/uL (ref 0.7–4.0)
MCH: 23.9 pg — ABNORMAL LOW (ref 26.0–34.0)
MCHC: 30.1 g/dL (ref 30.0–36.0)
MCV: 79.2 fL — ABNORMAL LOW (ref 80.0–100.0)
Monocytes Absolute: 0.5 10*3/uL (ref 0.1–1.0)
Monocytes Relative: 7 %
Neutro Abs: 4.6 10*3/uL (ref 1.7–7.7)
Neutrophils Relative %: 72 %
Platelets: 222 10*3/uL (ref 150–400)
RBC: 4.19 MIL/uL — ABNORMAL LOW (ref 4.22–5.81)
RDW: 21.1 % — ABNORMAL HIGH (ref 11.5–15.5)
WBC: 6.4 10*3/uL (ref 4.0–10.5)
nRBC: 0 % (ref 0.0–0.2)

## 2020-06-09 LAB — RESPIRATORY PANEL BY RT PCR (FLU A&B, COVID)
Influenza A by PCR: NEGATIVE
Influenza B by PCR: NEGATIVE
SARS Coronavirus 2 by RT PCR: NEGATIVE

## 2020-06-09 LAB — URINALYSIS, ROUTINE W REFLEX MICROSCOPIC
Bilirubin Urine: NEGATIVE
Glucose, UA: NEGATIVE mg/dL
Hgb urine dipstick: NEGATIVE
Ketones, ur: NEGATIVE mg/dL
Leukocytes,Ua: NEGATIVE
Nitrite: NEGATIVE
Protein, ur: NEGATIVE mg/dL
Specific Gravity, Urine: 1.009 (ref 1.005–1.030)
pH: 6 (ref 5.0–8.0)

## 2020-06-09 LAB — LACTIC ACID, PLASMA
Lactic Acid, Venous: 2.1 mmol/L (ref 0.5–1.9)
Lactic Acid, Venous: 1.5 mmol/L (ref 0.5–1.9)

## 2020-06-09 LAB — COMPREHENSIVE METABOLIC PANEL
ALT: 27 U/L (ref 0–44)
AST: 35 U/L (ref 15–41)
Albumin: 2.3 g/dL — ABNORMAL LOW (ref 3.5–5.0)
Alkaline Phosphatase: 317 U/L — ABNORMAL HIGH (ref 38–126)
Anion gap: 15 (ref 5–15)
BUN: 18 mg/dL (ref 8–23)
CO2: 28 mmol/L (ref 22–32)
Calcium: 8 mg/dL — ABNORMAL LOW (ref 8.9–10.3)
Chloride: 99 mmol/L (ref 98–111)
Creatinine, Ser: 1 mg/dL (ref 0.61–1.24)
GFR calc Af Amer: >60 mL/min (ref >60)
GFR calc non Af Amer: >60 mL/min (ref >60)
Glucose, Bld: 118 mg/dL — ABNORMAL HIGH (ref 70–99)
Potassium: 3.4 mmol/L — ABNORMAL LOW (ref 3.5–5.1)
Sodium: 142 mmol/L (ref 135–145)
Total Bilirubin: 0.5 mg/dL (ref 0.3–1.2)
Total Protein: 7.7 g/dL (ref 6.5–8.1)

## 2020-06-09 LAB — PROTIME-INR
INR: 1.5 — ABNORMAL HIGH (ref 0.8–1.2)
Prothrombin Time: 17.2 seconds — ABNORMAL HIGH (ref 11.4–15.2)

## 2020-06-09 LAB — CBG MONITORING, ED: Glucose-Capillary: 109 mg/dL — ABNORMAL HIGH (ref 70–99)

## 2020-06-09 LAB — TROPONIN I (HIGH SENSITIVITY): Troponin I (High Sensitivity): 16 ng/L (ref <18)

## 2020-06-09 MED ORDER — ONDANSETRON HCL 4 MG PO TABS: 4.0000 mg | ORAL_TABLET | Freq: Four times a day (QID) | ORAL | Status: DC | PRN

## 2020-06-09 MED ORDER — ENZALUTAMIDE 80 MG PO TABS: 80.0000 mg | ORAL_TABLET | Freq: Every day | ORAL | Status: DC

## 2020-06-09 MED ORDER — LACTATED RINGERS IV BOLUS
1000.0000 mL | Freq: Once | INTRAVENOUS | Status: AC
  Administered 2020-06-09: 1000 mL via INTRAVENOUS

## 2020-06-09 MED ORDER — TRAMADOL HCL 50 MG PO TABS
50.0000 mg | ORAL_TABLET | Freq: Once | ORAL | Status: AC
  Administered 2020-06-09: 50 mg via ORAL
  Filled 2020-06-09: qty 1

## 2020-06-09 MED ORDER — RIVAROXABAN 20 MG PO TABS
20.0000 mg | ORAL_TABLET | Freq: Every day | ORAL | Status: DC
  Administered 2020-06-10 – 2020-06-12 (×3): 20 mg via ORAL
  Filled 2020-06-09 (×3): qty 1

## 2020-06-09 MED ORDER — ACETAMINOPHEN 325 MG PO TABS: 650.0000 mg | ORAL_TABLET | Freq: Four times a day (QID) | ORAL | Status: DC | PRN

## 2020-06-09 MED ORDER — ACETAMINOPHEN 650 MG RE SUPP: 650.0000 mg | Freq: Four times a day (QID) | RECTAL | Status: DC | PRN

## 2020-06-09 MED ORDER — MORPHINE SULFATE (PF) 2 MG/ML IV SOLN
2.0000 mg | Freq: Once | INTRAVENOUS | Status: AC
  Administered 2020-06-09: 2 mg via INTRAVENOUS
  Filled 2020-06-09: qty 1

## 2020-06-09 MED ORDER — RAMIPRIL 10 MG PO CAPS
10.0000 mg | ORAL_CAPSULE | Freq: Every day | ORAL | Status: DC
  Administered 2020-06-10: 10 mg via ORAL
  Filled 2020-06-09 (×2): qty 1

## 2020-06-09 MED ORDER — MORPHINE SULFATE (PF) 4 MG/ML IV SOLN: 4.0000 mg | INTRAVENOUS | Status: DC | PRN

## 2020-06-09 MED ORDER — TAMSULOSIN HCL 0.4 MG PO CAPS: 0.4000 mg | ORAL_CAPSULE | Freq: Every day | ORAL | Status: DC

## 2020-06-09 MED ORDER — ONDANSETRON HCL 4 MG/2ML IJ SOLN: 4.0000 mg | Freq: Four times a day (QID) | INTRAMUSCULAR | Status: DC | PRN

## 2020-06-09 MED ORDER — POLYETHYLENE GLYCOL 3350 17 G PO PACK: 17.0000 g | PACK | Freq: Every day | ORAL | Status: DC | PRN

## 2020-06-09 MED ORDER — TRAMADOL HCL 50 MG PO TABS
50.0000 mg | ORAL_TABLET | Freq: Three times a day (TID) | ORAL | Status: DC | PRN
  Administered 2020-06-10 – 2020-06-13 (×5): 50 mg via ORAL
  Filled 2020-06-09 (×5): qty 1

## 2020-06-09 MED ORDER — METOPROLOL SUCCINATE ER 50 MG PO TB24
100.0000 mg | ORAL_TABLET | Freq: Every day | ORAL | Status: DC
  Administered 2020-06-10 – 2020-06-11 (×2): 100 mg via ORAL
  Filled 2020-06-09 (×2): qty 2

## 2020-06-09 MED ORDER — ATORVASTATIN CALCIUM 10 MG PO TABS
20.0000 mg | ORAL_TABLET | Freq: Every day | ORAL | Status: DC
  Administered 2020-06-10 – 2020-06-13 (×4): 20 mg via ORAL
  Filled 2020-06-09 (×4): qty 2

## 2020-06-09 MED ORDER — HYDRALAZINE HCL 20 MG/ML IJ SOLN
10.0000 mg | Freq: Four times a day (QID) | INTRAMUSCULAR | Status: DC | PRN
  Administered 2020-06-11: 10 mg via INTRAVENOUS
  Filled 2020-06-09: qty 1

## 2020-06-09 MED ORDER — NITROGLYCERIN 0.4 MG SL SUBL: 0.4000 mg | SUBLINGUAL_TABLET | SUBLINGUAL | Status: DC | PRN

## 2020-06-09 MED ORDER — LACTATED RINGERS IV SOLN: INTRAVENOUS | Status: DC

## 2020-06-09 NOTE — ED Notes (Signed)
Could not get pt's temp orally or axillary

## 2020-06-09 NOTE — ED Notes (Signed)
Orthostatic vitals for the 3 minute stand patient could not stand.

## 2020-06-09 NOTE — H&P (Addendum)
History and Physical    Adrian Singh GLO:[id number] DOB: [birth year] DOA: 06/09/2020  PCP: [name], MD  Patient coming from: Home   Chief Complaint:  Chief Complaint  Patient presents with  . Hypotension     HPI:    86 year old male with past medical history of coronary artery disease, paroxysmal atrial fibrillation on Xarelto, noninsulin-dependent diabetes mellitus type 2, recent acute stroke 04/2020, hypertension, chronic kidney disease stage IIIa and metastatic prostate cancer to bone who presents to [facility] emergency department with complaints of severe lightheadedness and weakness.  Since the patient's recent discharge from [facility] in August, patient has been undergoing rehabilitation in a local skilled nursing facility.  Patient explains that over the past several weeks patient has participated with physical therapy has been experiencing episodes of intense lightheadedness.  Patient describes these episodes of lightheadedness as "feeling like I am going to pass out."  He states that typically these episodes occur with attempting to rise from a seated position and are associated with generalized weakness.  Upon further questioning patient also complains of some associated palpitations as well as occasional vague chest discomfort.  Chest discomfort is brief, mild and midsternal in location.  Denies recently initiated medications.  Patient denies sick contacts, recent travel or confirmed contact with known COVID-19 infection.  Patient was discharged home from his skilled nursing facility the morning of 9/23.  Upon arriving home, the daughter noticed that the patient nearly passed out after getting out of the car to walk to the house.  This prompted the patient being brought to [facility] emergency department for evaluation.  Upon evaluation in emergency department, serial orthostatic vital signs were positive.  Patient was hydrated with 1 L of  normal saline with minimal improvement of symptoms.  Due to patient's complicated medical history and ongoing symptoms the emergency department provider has called the hospitalist group to evaluate the patient for admission to the hospital.  Review of Systems:   Review of Systems  Constitutional: Positive for weight loss.  Cardiovascular: Positive for chest pain and palpitations.  Neurological: Positive for weakness.  All other systems reviewed and are negative.   Past Medical History:  Diagnosis Date  . Arthritis    "legs, back" (10/06/2015)  . Atrial fibrillation ([place])    x1  . CAD (coronary artery disease)    nonobstructive  . Cancer ([place])    skin cancer on ear (froze it off) and back (cut it off)  . Dysrhythmia   . Esophageal reflux    hx of  . History of kidney stones   . Hx of heart artery stent   . Hypertrophy of prostate without urinary obstruction and other lower urinary tract symptoms (LUTS)   . Osteoarthrosis, unspecified whether generalized or localized, unspecified site   . Prostate cancer ([place])   . Skin cancer   . Stroke [facility]) 03/2014   "had a series of mini strokes; maybe 4"; denies residual on 10/06/2015  . Thrombocytopenia ([place])   . Type II diabetes mellitus ([place])    type 2  . Unspecified essential hypertension     Past Surgical History:  Procedure Laterality Date  . BACK SURGERY     2001  . CARDIAC CATHETERIZATION  12/2007  . CARDIAC CATHETERIZATION N/A 09/22/2015   Procedure: Left Heart Cath and Coronary Angiography;  Surgeon: Peter M Martinique, MD;  Location: [place] CV LAB;  Service: Cardiovascular;  Laterality: N/A;  . CARDIAC CATHETERIZATION N/A 10/06/2015  Procedure: Coronary Stent Intervention;  Surgeon: Peter M Martinique, MD;  Location: [place] [facility];  Service: Cardiovascular;  Laterality: N/A;  . CARDIAC CATHETERIZATION N/A 02/29/2016   Procedure: Left Heart Cath and Coronary Angiography;  Surgeon: Jettie Booze, MD;  Location: [place]  CV LAB;  Service: Cardiovascular;  Laterality: N/A;  . COLONOSCOPY W/ BIOPSIES AND POLYPECTOMY    . CORONARY STENT PLACEMENT  10/06/2015   [name]  with DES  . EAR CYST EXCISION N/A 04/06/2015   Procedure: EXCISION OF SCALP CYST;  Surgeon: Donnie Mesa, MD;  Location: [place];  Service: General;  Laterality: N/A;  . ESOPHAGOGASTRODUODENOSCOPY (EGD) WITH ESOPHAGEAL DILATION  2001   with dilation  . LUMBAR DISC SURGERY  09/26/1999   "cleaned out arthritis and bone spurs"  . SHOULDER ARTHROSCOPY W/ ROTATOR CUFF REPAIR Left 2005  . THULIUM LASER TURP (TRANSURETHRAL RESECTION OF PROSTATE) N/A 01/21/2017   Procedure: Marcelino Duster LASER TURP (TRANSURETHRAL RESECTION OF PROSTATE) CAUTERIZATION OF BLADDER LESION;  Surgeon: Franchot Gallo, MD;  Location: [facility];  Service: Urology;  Laterality: N/A;     reports that he has quit smoking. His smoking use included cigarettes. He quit after 3.00 years of use. He has never used smokeless tobacco. He reports that he does not drink alcohol and does not use drugs.  Allergies  Allergen Reactions  . Soma [Carisoprodol] Other (See Comments)    "did a number on me"  . Doxazosin Other (See Comments)    dizziness    Family History  Problem Relation Age of Onset  . Stroke Father   . Peripheral vascular disease Father        amputation  . Heart failure Mother        CHF  . Coronary artery disease Mother   . Heart attack Mother 14       Multiple  . COPD Brother   . Heart disease Brother   . Cancer Brother        Bone  . Lung cancer Sister      Prior to Admission medications   Medication Sig Start Date End Date Taking? Authorizing Provider  acetaminophen (TYLENOL) 650 MG CR tablet Take 650 mg by mouth 2 (two) times daily as needed for pain.   Yes [provider]  Amino Acids-Protein Hydrolys (FEEDING SUPPLEMENT, PRO-STAT SUGAR FREE 64,) LIQD Take 30 mLs by mouth in the morning and at bedtime.   Yes [provider]  Ascorbic Acid (VITAMIN C)  1000 MG tablet Take 1,000 mg by mouth daily.   Yes [provider]  atorvastatin (LIPITOR) 20 MG tablet Take 20 mg by mouth daily.   Yes [provider]  B Complex Vitamins (VITAMIN B COMPLEX PO) Take 1 tablet by mouth daily.   Yes [provider]  calcium carbonate (CALCIUM 600) 1500 (600 Ca) MG TABS tablet Take 600 mg of elemental calcium by mouth daily with breakfast.    Yes [provider]  Cholecalciferol (VITAMIN D3) 1.25 MG (50000 UT) CAPS Take 50,000 Units by mouth once a week. Saturday   Yes [provider]  cyanocobalamin 1000 MCG tablet Take 1,000 mg by mouth at bedtime.    Yes [provider]  docusate sodium (COLACE) 250 MG capsule Take 250 mg by mouth daily as needed for constipation.    Yes [provider]  doxazosin (CARDURA) 1 MG tablet Take 1 tablet (1 mg total) by mouth at bedtime. 05/12/20  Yes Charlynne Cousins, MD  enzalutamide (XTANDI) 80 MG tablet Take 80 mg by mouth daily.   Yes [provider]  furosemide (LASIX) 40 MG tablet Take 1 tablet (40 mg total) by mouth daily as needed. Patient taking differently: Take 40 mg by mouth daily.  05/15/20  Yes Charlynne Cousins, MD  glimepiride (AMARYL) 4 MG tablet Take 1 tablet by mouth twice daily Patient taking differently: Take 4 mg by mouth in the morning and at bedtime.  06/03/19  Yes [name], MD  metFORMIN (GLUCOPHAGE) 500 MG tablet Take 1 tablet (500 mg total) by mouth daily with breakfast. 07/20/19  Yes Martinique, Peter M, MD  metoprolol succinate (TOPROL-XL) 100 MG 24 hr tablet Take 100 mg by mouth daily. Take with or immediately following a meal.   Yes [provider]  Multiple Vitamin (MULTIVITAMIN WITH MINERALS) TABS tablet Take 1 tablet by mouth daily.    Yes [provider]  ramipril (ALTACE) 10 MG capsule Take 1 capsule (10 mg total) by mouth daily. 05/16/20  Yes Charlynne Cousins, MD  tamsulosin (FLOMAX) 0.4 MG CAPS  capsule Take 1 capsule (0.4 mg total) by mouth daily. Patient taking differently: Take 0.4 mg by mouth daily after supper.  06/25/19  Yes [name], MD  traMADol (ULTRAM) 50 MG tablet TAKE 1 TABLET BY MOUTH THREE TIMES DAILY AS NEEDED Patient taking differently: Take 50 mg by mouth every 8 (eight) hours as needed for moderate pain.  08/07/18  Yes [name], MD  atorvastatin (LIPITOR) 40 MG tablet Take 1/2 (one-half) tablet by mouth once daily Patient not taking: Reported on 06/09/2020 06/08/19   [name], MD  BAYER MICROLET LANCETS lancets Use to test blood sugar once daily dx: E11.40 10/18/15   [name], MD  Blood Glucose Monitoring Suppl (CONTOUR NEXT EZ MONITOR) w/Device KIT USE TO TEST BLOOD SUGAR ONCE DAILY. Dx Code E11.40 06/13/16   Viviana Simpler I, MD  glucose blood (CONTOUR NEXT TEST) test strip Check blood sugar once daily and as directed.Dx Code E11.40 03/19/19   [name], MD  nitroGLYCERIN (NITROSTAT) 0.4 MG SL tablet Place 1 tablet (0.4 mg total) under the tongue every 5 (five) minutes as needed for chest pain. Patient taking differently: Place 0.4 mg under the tongue every 5 (five) minutes as needed for chest pain. If no relief, call MD 05/15/18   [name], MD  OVER THE COUNTER MEDICATION Take 15 mLs by mouth in the morning and at bedtime. [employer]    [provider]  rivaroxaban (XARELTO) 20 MG TABS tablet Take 1 tablet (20 mg total) by mouth daily with supper. 01/21/20   Martinique, Peter M, MD    Physical Exam: Vitals:   06/09/20 2000 06/09/20 2026 06/09/20 2030 06/09/20 2100  BP: (!) 145/84 (!) 173/87 (!) 169/85 (!) 169/83  Pulse: 72 73 70 65  Resp: _0 Temp:      TempSrc:      SpO2: 98% 97% 96% 98%  Weight:      Height:        Constitutional: Acute alert and oriented x3, no associated distress.   Skin: no rashes, no lesions, notable poor skin turgor.   Eyes: Pupils are equally reactive to light.  No evidence of  scleral icterus or conjunctival pallor.  ENMT: Extremely dry mucous membranes noted.  Posterior pharynx clear of any exudate or lesions.   Neck: normal, supple, no masses, no thyromegaly.  No evidence  of jugular venous distension.   Respiratory: clear to auscultation bilaterally, no wheezing, no crackles. Normal respiratory effort. No accessory muscle use.  Cardiovascular: Regular rate and rhythm, no murmurs / rubs / gallops. No extremity edema. 2+ pedal pulses. No carotid bruits.  Chest:   Nontender without crepitus or deformity.   Back:   Nontender without crepitus or deformity. Abdomen: Abdomen is soft and nontender.  No evidence of intra-abdominal masses.  Positive bowel sounds noted in all quadrants.   Musculoskeletal: No joint deformity upper and lower extremities. Good ROM, no contractures. Normal muscle tone.  Neurologic: CN 2-12 grossly intact. Sensation intact.  Patient moving all 4 extremities spontaneously.  Cerebellar function testing is unremarkable.  Patient is following all commands.  Patient is responsive to verbal stimuli.   Psychiatric: Patient exhibits normal mood with appropriate affect.  Patient seems to possess insight as to their current situation.     Labs on Admission: I have personally reviewed following labs and imaging studies -   CBC: Recent Labs  Lab 06/09/20 1521  WBC 6.4  NEUTROABS 4.6  HGB 10.0*  HCT 33.2*  MCV 79.2*  PLT 829   Basic Metabolic Panel: Recent Labs  Lab 06/09/20 1521  NA 142  K 3.4*  CL 99  CO2 28  GLUCOSE 118*  BUN 18  CREATININE 1.00  CALCIUM 8.0*   GFR: Estimated Creatinine Clearance: 54.2 mL/min (by C-G formula based on SCr of 1 mg/dL). Liver Function Tests: Recent Labs  Lab 06/09/20 1521  AST 35  ALT 27  ALKPHOS 317*  BILITOT 0.5  PROT 7.7  ALBUMIN 2.3*   No results for input(s): LIPASE, AMYLASE in the last 168 hours. No results for input(s): AMMONIA in the last 168 hours. Coagulation Profile: Recent Labs   Lab 06/09/20 1521  INR 1.5*   Cardiac Enzymes: No results for input(s): CKTOTAL, CKMB, CKMBINDEX, TROPONINI in the last 168 hours. BNP (last 3 results) No results for input(s): PROBNP in the last 8760 hours. HbA1C: No results for input(s): HGBA1C in the last 72 hours. CBG: Recent Labs  Lab 06/09/20 1455  GLUCAP 109*   Lipid Profile: No results for input(s): CHOL, HDL, LDLCALC, TRIG, CHOLHDL, LDLDIRECT in the last 72 hours. Thyroid Function Tests: No results for input(s): TSH, T4TOTAL, FREET4, T3FREE, THYROIDAB in the last 72 hours. Anemia Panel: No results for input(s): VITAMINB12, FOLATE, FERRITIN, TIBC, IRON, RETICCTPCT in the last 72 hours. Urine analysis:    Component Value Date/Time   COLORURINE YELLOW 06/09/2020 [place] 06/09/2020 1724   LABSPEC 1.009 06/09/2020 1724   PHURINE 6.0 06/09/2020 1724   GLUCOSEU NEGATIVE 06/09/2020 1724   HGBUR NEGATIVE 06/09/2020 1724   BILIRUBINUR NEGATIVE 06/09/2020 1724   BILIRUBINUR negative 03/18/2014 1014   KETONESUR NEGATIVE 06/09/2020 1724   PROTEINUR NEGATIVE 06/09/2020 1724   UROBILINOGEN 1.0 03/31/2014 1228   NITRITE NEGATIVE 06/09/2020 1724   LEUKOCYTESUR NEGATIVE 06/09/2020 1724    Radiological Exams on Admission - Personally Reviewed: DG Chest 2 View  Result Date: 06/09/2020 CLINICAL DATA:  Suspected sepsis, discharge from hospital but blood pressure suddenly draft and patient became dizzy EXAM: CHEST - 2 VIEW COMPARISON:  Chest x-ray 05/11/2020, CT chest 05/01/2020 FINDINGS: The heart size and mediastinal contours are within normal limits. Coronary artery stent in grossly similar position. Lucency posterior to the left ventricle consistent with known hiatal hernia. No focal consolidation. No pulmonary edema. No pleural effusion. No pneumothorax. No acute osseous abnormality.  Degenerative changes of the  spine. IMPRESSION: No active cardiopulmonary disease. Electronically Signed   By: Iven Finn M.D.    On: 06/09/2020 16:12    EKG: Personally reviewed.  Rhythm is normal sinus rhythm with heart rate of 70 bpm.  Notable Q waves in the inferior leads as well as leads V5 and V6.  No dynamic ST segment changes appreciated.  Assessment/Plan Principal Problem:   Postural dizziness with presyncope  Establishment of positive orthostatic vital signs both subjectively and via subjective symptoms in the emergency department.  Symptoms are likely multifactorial.  Patient has a known history of orthostatic hypotension which is mentioned as recently as 12/2019 via cardiology notes.    Orthostatic hypotension is seemingly exacerbated by patient's reports of poor oral intake - particularly poor fluid intake -as well as ongoing use of Lasix, Cardura.  Hydrating patient gently throughout the evening with intravenous isotonic fluids.  Holding Lasix and Cardura  Monitoring patient on telemetry throughout the evening as well as cycling cardiac enzymes due to complaints of intermittent palpitations and chest discomfort  We will order compression stockings to be placed in orthostatic vital signs to be repeated in the morning  Considering resting hypertension, patient would likely not be a good candidate for Florinef or midodrine.  Active Problems:   Orthostatic hypotension   Please see assessment and plan above  Essential hypertension   Presence of hypertension while at rest complicates management of orthostatic hypotension  Resume home regimen of metoprolol  Temporarily holding home regimen of Cardura and Lasix  As needed intravenous antihypertensives persistently elevated blood pressures.    Uncontrolled type 2 diabetes mellitus with diabetic polyneuropathy, without long-term current use of insulin ([place])   . Patient been placed on Accu-Cheks before every meal and nightly with sliding scale insulin . Holding home regimen of hypoglycemics . Last hemoglobin A1c 7.5 in August . Diabetic  Diet    PAF (paroxysmal atrial fibrillation) (H[facility])   Currently rate controlled.  Continue home regimen of anticoagulation  Continue home regimen of rate controlling agent -metoprolol XL  Monitoring on telemetry    Chronic kidney disease, stage 3a  . Strict intake and output monitoring . Minimizing nephrotoxic agents as much as possible  . Serial chemistries to monitor renal function and electrolytes    Primary malignant neoplasm of prostate metastatic to bone [facility])   Outpatient follow-up    Protein-calorie malnutrition, severe  Encourage oral intake  Nutrition consultation placed, recommendations appreciated    Code Status:  Full code Family Communication: Daughter is at bedside and has been updated on plan of care  Status is: Observation  The patient remains OBS appropriate and will d/c before 2 midnights.  Dispo: The patient is from: Home              Anticipated d/c is to: Home              Anticipated d/c date is: 2 days              Patient currently is not medically stable to d/c.        Vernelle Emerald MD [facility] Pager [phone]  If 7PM-7AM, please contact night-coverage [email or web address] Use universal [place] password for that web site. If you do not have the password, please call the hospital operator.  06/09/2020, 9:22 PM

## 2020-06-09 NOTE — ED Notes (Signed)
x2 unsuccessful IV attempts

## 2020-06-09 NOTE — ED Triage Notes (Signed)
Per pt/family-recently discharged from hospital to rehab-states attempting to get him in the house when he BP suddenly dropped-patient become "dead weight", dizzy

## 2020-06-09 NOTE — ED Notes (Signed)
ED TO INPATIENT HANDOFF REPORT  Name/Age/Gender Adrian Singh 86 y.o. male  Code Status    Code Status Orders  (From admission, onward)         Start     Ordered   06/09/20 2112  Full code  Continuous        06/09/20 2111        Code Status History    Date Active Date Inactive Code Status Order ID Comments User Context   06/09/2020 2100 06/09/2020 2111 Full Code [id number]  Vernelle Emerald, MD ED   04/30/2020 2046 05/12/2020 2353 Full Code [id number]  Vernelle Emerald, MD ED   01/21/2017 1120 01/22/2017 1453 Full Code [id number]  Franchot Gallo, MD Inpatient   02/28/2016 1601 03/01/2016 0006 Full Code [id number]  Consuelo Pandy, PA-C ED   10/06/2015 0831 10/07/2015 1619 Full Code [id number]  [place], Peter M, MD Inpatient   09/22/2015 1031 09/22/2015 1617 Full Code [id number]  [place], Peter M, MD Inpatient   03/31/2014 2203 04/02/2014 1841 Full Code [id number]  Adrian Millard, MD Inpatient   Advance Care Planning Activity      Home/SNF/Other Home  Chief Complaint Postural dizziness with presyncope [R42, R55]  Level of Care/Admitting Diagnosis ED Disposition    ED Disposition Condition [place]: [facility] [329924]  Level of Care: Telemetry [5]  Admit to tele based on following criteria: Other see comments  Comments: eval of presyncope  Covid Evaluation: Confirmed COVID Negative  Diagnosis: Postural dizziness with presyncope [[id number]]  Admitting Physician: Vernelle Emerald [[id number]]  Attending Physician: Vernelle Emerald [[id number]]       Medical History Past Medical History:  Diagnosis Date  . Arthritis    "legs, back" (10/06/2015)  . Atrial fibrillation ([place])    x1  . CAD (coronary artery disease)    nonobstructive  . Cancer ([place])    skin cancer on ear (froze it off) and back (cut it off)  . Dysrhythmia   . Esophageal reflux    hx of  . History of kidney stones   . Hx of heart artery stent   . Hypertrophy of  prostate without urinary obstruction and other lower urinary tract symptoms (LUTS)   . Osteoarthrosis, unspecified whether generalized or localized, unspecified site   . Prostate cancer ([place])   . Skin cancer   . Stroke [facility]) 03/2014   "had a series of mini strokes; maybe 4"; denies residual on 10/06/2015  . Thrombocytopenia ([place])   . Type II diabetes mellitus ([place])    type 2  . Unspecified essential hypertension     Allergies Allergies  Allergen Reactions  . Soma [Carisoprodol] Other (See Comments)    "did a number on me"  . Doxazosin Other (See Comments)    dizziness    IV Location/Drains/Wounds Patient Lines/Drains/Airways Status    Active Line/Drains/Airways    Name Placement date Placement time Site Days   Peripheral IV 06/09/20 Right Antecubital 06/09/20  1518  Antecubital  less than 1   Incision (Closed) 01/21/17 Perineum Other (Comment) 01/21/17  0847   1235   Incision (Closed) 05/03/20 Back Mid;Upper;Lower 05/03/20  0909   37   Pressure Injury 05/03/20 Coccyx Stage 1 -  Intact skin with non-blanchable redness of a localized area usually over a bony prominence. pink 05/03/20  0908   37          Labs/Imaging Results for orders placed or performed during the  hospital encounter of 06/09/20 (from the past 48 hour(s))  CBG monitoring, ED     Status: Abnormal   Collection Time: 06/09/20  2:55 PM  Result Value Ref Range   Glucose-Capillary 109 (H) 70 - 99 mg/dL    Comment: Glucose reference range applies only to samples taken after fasting for at least 8 hours.  Comprehensive metabolic panel     Status: Abnormal   Collection Time: 06/09/20  3:21 PM  Result Value Ref Range   Sodium 142 135 - 145 mmol/L   Potassium 3.4 (L) 3.5 - 5.1 mmol/L   Chloride 99 98 - 111 mmol/L   CO2 28 22 - 32 mmol/L   Glucose, Bld 118 (H) 70 - 99 mg/dL    Comment: Glucose reference range applies only to samples taken after fasting for at least 8 hours.   BUN 18 8 - 23 mg/dL   Creatinine, Ser  1.00 0.61 - 1.24 mg/dL   Calcium 8.0 (L) 8.9 - 10.3 mg/dL   Total Protein 7.7 6.5 - 8.1 g/dL   Albumin 2.3 (L) 3.5 - 5.0 g/dL   AST 35 15 - 41 U/L   ALT 27 0 - 44 U/L   Alkaline Phosphatase 317 (H) 38 - 126 U/L   Total Bilirubin 0.5 0.3 - 1.2 mg/dL   GFR calc non Af Amer >60 >60 mL/min   GFR calc Af Amer >60 >60 mL/min   Anion gap 15 5 - 15    Comment: Performed at [facility], [place] [address], [place], [place] [address]  Lactic acid, plasma     Status: Abnormal   Collection Time: 06/09/20  3:21 PM  Result Value Ref Range   Lactic Acid, Venous 2.1 (HH) 0.5 - 1.9 mmol/L    Comment: CRITICAL RESULT CALLED TO, READ BACK BY AND VERIFIED WITH: Kathryne Sharper. RN @1628  ON 09.23.2021 BY COHEN,K Performed at [facility], [place] [address], [place], [place] [address]   CBC with Differential     Status: Abnormal   Collection Time: 06/09/20  3:21 PM  Result Value Ref Range   WBC 6.4 4.0 - 10.5 K/uL   RBC 4.19 (L) 4.22 - 5.81 MIL/uL   Hemoglobin 10.0 (L) 13.0 - 17.0 g/dL   HCT 33.2 (L) 39 - 52 %   MCV 79.2 (L) 80.0 - 100.0 fL   MCH 23.9 (L) 26.0 - 34.0 pg   MCHC 30.1 30.0 - 36.0 g/dL   RDW 21.1 (H) 11.5 - 15.5 %   Platelets 222 150 - 400 K/uL   nRBC 0.0 0.0 - 0.2 %   Neutrophils Relative % 72 %   Neutro Abs 4.6 1.7 - 7.7 K/uL   Lymphocytes Relative 15 %   Lymphs Abs 0.9 0.7 - 4.0 K/uL   Monocytes Relative 7 %   Monocytes Absolute 0.5 0 - 1 K/uL   Eosinophils Relative 0 %   Eosinophils Absolute 0.0 0 - 0 K/uL   Basophils Relative 1 %   Basophils Absolute 0.1 0 - 0 K/uL   Immature Granulocytes 5 %   Abs Immature Granulocytes 0.31 (H) 0.00 - 0.07 K/uL   Tear Drop Cells PRESENT     Comment: Performed at [facility], [place] [address], [place], [place] [address]  Protime-INR     Status: Abnormal   Collection Time: 06/09/20  3:21 PM  Result Value Ref Range   Prothrombin Time 17.2 (H) 11.4 - 15.2 seconds   INR 1.5 (H)  0.8 - 1.2     Comment: (NOTE) INR goal varies based on device and disease states. Performed at [facility], [place] [address] [place], [place] [address]   Respiratory Panel by RT PCR (Flu A&B, Covid) - Nasopharyngeal Swab     Status: None   Collection Time: 06/09/20  3:40 PM   Specimen: Nasopharyngeal Swab  Result Value Ref Range   SARS Coronavirus 2 by RT PCR NEGATIVE NEGATIVE    Comment: (NOTE) SARS-CoV-2 target nucleic acids are NOT DETECTED.  The SARS-CoV-2 RNA is generally detectable in upper respiratoy specimens during the acute phase of infection. The lowest concentration of SARS-CoV-2 viral copies this assay can detect is 131 copies/mL. A negative result does not preclude SARS-Cov-2 infection and should not be used as the sole basis for treatment or other patient management decisions. A negative result may occur with  improper specimen collection/handling, submission of specimen other than nasopharyngeal swab, presence of viral mutation(s) within the areas targeted by this assay, and inadequate number of viral copies (<131 copies/mL). A negative result must be combined with clinical observations, patient history, and epidemiological information. The expected result is Negative.  Fact Sheet for Patients:  PinkCheek.be  Fact Sheet for Healthcare Providers:  GravelBags.it  This test is no t yet approved or cleared by the [place] FDA and  has been authorized for detection and/or diagnosis of SARS-CoV-2 by FDA under an Emergency Use Authorization (EUA). This EUA will remain  in effect (meaning this test can be used) for the duration of the COVID-19 declaration under Section 564(b)(1) of the Act, 21 U.S.C. section 360bbb-3(b)(1), unless the authorization is terminated or revoked sooner.     Influenza A by PCR NEGATIVE NEGATIVE   Influenza B by PCR NEGATIVE NEGATIVE    Comment: (NOTE) The Xpert Xpress  SARS-CoV-2/FLU/RSV assay is intended as an aid in  the diagnosis of influenza from Nasopharyngeal swab specimens and  should not be used as a sole basis for treatment. Nasal washings and  aspirates are unacceptable for Xpert Xpress SARS-CoV-2/FLU/RSV  testing.  Fact Sheet for Patients: PinkCheek.be  Fact Sheet for Healthcare Providers: GravelBags.it  This test is not yet approved or cleared by the [place] FDA and  has been authorized for detection and/or diagnosis of SARS-CoV-2 by  FDA under an Emergency Use Authorization (EUA). This EUA will remain  in effect (meaning this test can be used) for the duration of the  Covid-19 declaration under Section 564(b)(1) of the Act, 21  U.S.C. section 360bbb-3(b)(1), unless the authorization is  terminated or revoked. Performed at [facility] [place] [address], [place], [place] [address]   Urinalysis, Routine w reflex microscopic Urine, Clean Catch     Status: Abnormal   Collection Time: 06/09/20  5:24 PM  Result Value Ref Range   Color, Urine YELLOW YELLOW   APPearance CLEAR CLEAR   Specific Gravity, Urine 1.009 1.005 - 1.030   pH 6.0 5.0 - 8.0   Glucose, UA NEGATIVE NEGATIVE mg/dL   Hgb urine dipstick NEGATIVE NEGATIVE   Bilirubin Urine NEGATIVE NEGATIVE   Ketones, ur NEGATIVE NEGATIVE mg/dL   Protein, ur NEGATIVE NEGATIVE mg/dL   Nitrite NEGATIVE NEGATIVE   Leukocytes,Ua NEGATIVE NEGATIVE   RBC / HPF 0-5 0 - 5 RBC/hpf   WBC, UA 0-5 0 - 5 WBC/hpf   Bacteria, UA RARE (A) NONE SEEN   Squamous Epithelial / LPF 0-5 0 - 5   Mucus PRESENT  Hyaline Casts, UA PRESENT     Comment: Performed at [facility], [place] [address], [place], [place] [address]  Lactic acid, plasma     Status: None   Collection Time: 06/09/20  7:00 PM  Result Value Ref Range   Lactic Acid, Venous 1.5 0.5 - 1.9 mmol/L    Comment: Performed at [facility], [place] [address], [place], [place] [address]  Troponin I (High Sensitivity)     Status: None   Collection Time: 06/09/20  7:00 PM  Result Value Ref Range   Troponin I (High Sensitivity) 16 <18 ng/L    Comment: (NOTE) Elevated high sensitivity troponin I (hsTnI) values and significant  changes across serial measurements may suggest ACS but many other  chronic and acute conditions are known to elevate hsTnI results.  Refer to the "Links" section for chest pain algorithms and additional  guidance. Performed at [facility], [place] [address], [place], [place] [address]    DG Chest 2 View  Result Date: 06/09/2020 CLINICAL DATA:  Suspected sepsis, discharge from hospital but blood pressure suddenly draft and patient became dizzy EXAM: CHEST - 2 VIEW COMPARISON:  Chest x-ray 05/11/2020, CT chest 05/01/2020 FINDINGS: The heart size and mediastinal contours are within normal limits. Coronary artery stent in grossly similar position. Lucency posterior to the left ventricle consistent with known hiatal hernia. No focal consolidation. No pulmonary edema. No pleural effusion. No pneumothorax. No acute osseous abnormality.  Degenerative changes of the spine. IMPRESSION: No active cardiopulmonary disease. Electronically Signed   By: Iven Finn M.D.   On: 06/09/2020 16:12    Pending Labs Unresulted Labs (From admission, onward)          Start     Ordered   06/10/20 0500  Comprehensive metabolic panel  Tomorrow morning,   R        06/09/20 2111   06/10/20 0500  Magnesium  Tomorrow morning,   R        06/09/20 2111   06/10/20 0500  CBC WITH DIFFERENTIAL  Tomorrow morning,   R        06/09/20 2111   06/09/20 1447  Culture, blood (Routine x 2)  BLOOD CULTURE X 2,   STAT      06/09/20 1447          Vitals/Pain Today's Vitals   06/09/20 2030 06/09/20 2100 06/09/20 2130 06/09/20 2230  BP: (!) 169/85 (!) 169/83 (!) 158/79 (!) 187/84  Pulse: 70 65 68 66  Resp: 12 13  14 12   Temp:    98.8 F (37.1 C)  TempSrc:    Oral  SpO2: 96% 98% 96% 97%  Weight:      Height:      PainSc:        Isolation Precautions No active isolations  Medications Medications  traMADol (ULTRAM) tablet 50 mg (has no administration in time range)  enzalutamide (XTANDI) tablet 80 mg (has no administration in time range)  atorvastatin (LIPITOR) tablet 20 mg (has no administration in time range)  metoprolol succinate (TOPROL-XL) 24 hr tablet 100 mg (has no administration in time range)  nitroGLYCERIN (NITROSTAT) SL tablet 0.4 mg (has no administration in time range)  ramipril (ALTACE) capsule 10 mg (has no administration in time range)  tamsulosin (FLOMAX) capsule 0.4 mg (has no administration in time range)  rivaroxaban (XARELTO) tablet 20 mg (has no administration in time range)  lactated ringers infusion (has no administration in time  range)  acetaminophen (TYLENOL) tablet 650 mg (has no administration in time range)    Or  acetaminophen (TYLENOL) suppository 650 mg (has no administration in time range)  morphine 4 MG/ML injection 4 mg (has no administration in time range)  polyethylene glycol (MIRALAX / GLYCOLAX) packet 17 g (has no administration in time range)  ondansetron (ZOFRAN) tablet 4 mg (has no administration in time range)    Or  ondansetron (ZOFRAN) injection 4 mg (has no administration in time range)  hydrALAZINE (APRESOLINE) injection 10 mg (has no administration in time range)  lactated ringers bolus 1,000 mL (0 mLs Intravenous Stopped 06/09/20 1653)  traMADol (ULTRAM) tablet 50 mg (50 mg Oral Given 06/09/20 1657)  lactated ringers bolus 1,000 mL (0 mLs Intravenous Stopped 06/09/20 1848)  morphine 2 MG/ML injection 2 mg (2 mg Intravenous Given 06/09/20 2022)  lactated ringers bolus 1,000 mL (1,000 mLs Intravenous New Bag/Given (Non-Interop) 06/09/20 2032)    Mobility walks with device

## 2020-06-09 NOTE — ED Notes (Addendum)
Attempted to call report to RN on [place] and was told to call back in 5 minutes.

## 2020-06-09 NOTE — ED Provider Notes (Signed)
[facility] DEPT Provider Note   CSN: [id number] Arrival date & time: 06/09/20  1419     History Chief Complaint  Patient presents with  . Hypotension    Adrian Singh is a 86 y.o. male.  Patient has been having orthostatic near syncope, multiple episodes a day.  Almost every time he gets up.  Better with rest worse with movement.  No chest pain no shortness of breath, tunnel vision and spotty vision.  Recent admission for stroke, had similar symptoms throughout rehabilitation but was never addressed.  Poor oral nutrition poor oral hydration.  Urinating and having bowel movements normally no fevers no chills        Past Medical History:  Diagnosis Date  . Arthritis    "legs, back" (10/06/2015)  . Atrial fibrillation ([place])    x1  . CAD (coronary artery disease)    nonobstructive  . Cancer ([place])    skin cancer on ear (froze it off) and back (cut it off)  . Dysrhythmia   . Esophageal reflux    hx of  . History of kidney stones   . Hx of heart artery stent   . Hypertrophy of prostate without urinary obstruction and other lower urinary tract symptoms (LUTS)   . Osteoarthrosis, unspecified whether generalized or localized, unspecified site   . Prostate cancer ([place])   . Skin cancer   . Stroke [facility]) 03/2014   "had a series of mini strokes; maybe 4"; denies residual on 10/06/2015  . Thrombocytopenia ([place])   . Type II diabetes mellitus ([place])    type 2  . Unspecified essential hypertension     Patient Active Problem List   Diagnosis Date Noted  . Postural dizziness with presyncope 06/09/2020  . Protein-calorie malnutrition, severe 05/06/2020  . Palliative care by specialist   . Goals of care, counseling/discussion   . DNR (do not resuscitate) discussion   . FTT (failure to thrive) in adult   . Need for emotional support   . Pressure injury of skin 05/04/2020  . Stroke ([place]) 05/02/2020  . Generalized weakness 04/30/2020  . Fall at home,  initial encounter 04/30/2020  . Acute on chronic anemia 04/30/2020  . Elevated CK 04/30/2020  . Chronic kidney disease, stage 3a 04/30/2020  . Elevated troponin level not due myocardial infarction 04/30/2020  . Fatigue 04/29/2020  . Malnutrition of mild degree ([place]) 04/06/2019  . Diabetic foot ulcer ([place]) 04/06/2019  . Mood disorder ([place]) 04/06/2019  . Aortic atherosclerosis ([place]) 02/13/2017  . Prostate cancer ([place]) 02/13/2017  . Primary malignant neoplasm of prostate metastatic to bone ([place]) 02/13/2017  . Orthostatic hypotension 03/19/2016  . CAD S/P percutaneous coronary angioplasty 02/28/2016  . Atherosclerosis of native coronary artery of native heart with angina pectoris ([place])   . Abnormal nuclear stress test 09/22/2015  . Thrombocytopenia ([place])   . Advanced directives, counseling/discussion 09/27/2014  . PAF (paroxysmal atrial fibrillation) ([place]) 04/12/2014  . History of CVA (cerebrovascular accident) 03/31/2014  . CKD stage 3 due to type 2 diabetes mellitus ([place])   . Uncontrolled type 2 diabetes mellitus with diabetic polyneuropathy, without long-term current use of insulin ([place]) 11/05/2011  . Routine general medical examination at a health care facility 05/07/2011  . Essential hypertension 12/23/2009  . GERD 12/23/2009  . BPH (benign prostatic hyperplasia) 12/23/2009  . OSTEOARTHRITIS 12/23/2009    Past Surgical History:  Procedure Laterality Date  . BACK SURGERY     2001  . CARDIAC CATHETERIZATION  12/2007  .  CARDIAC CATHETERIZATION N/A 09/22/2015   Procedure: Left Heart Cath and Coronary Angiography;  Surgeon: Peter M Martinique, MD;  Location: [facility] CV [facility];  Service: Cardiovascular;  Laterality: N/A;  . CARDIAC CATHETERIZATION N/A 10/06/2015   Procedure: Coronary Stent Intervention;  Surgeon: Peter M Martinique, MD;  Location: [facility];  Service: Cardiovascular;  Laterality: N/A;  . CARDIAC CATHETERIZATION N/A 02/29/2016   Procedure: Left Heart Cath and Coronary  Angiography;  Surgeon: Jettie Booze, MD;  Location: [facility];  Service: Cardiovascular;  Laterality: N/A;  . COLONOSCOPY W/ BIOPSIES AND POLYPECTOMY    . CORONARY STENT PLACEMENT  10/06/2015   [name]  with DES  . EAR CYST EXCISION N/A 04/06/2015   Procedure: EXCISION OF SCALP CYST;  Surgeon: Donnie Mesa, MD;  Location: [facility];  Service: General;  Laterality: N/A;  . ESOPHAGOGASTRODUODENOSCOPY (EGD) WITH ESOPHAGEAL DILATION  2001   with dilation  . LUMBAR DISC SURGERY  09/26/1999   "cleaned out arthritis and bone spurs"  . SHOULDER ARTHROSCOPY W/ ROTATOR CUFF REPAIR Left 2005  . THULIUM LASER TURP (TRANSURETHRAL RESECTION OF PROSTATE) N/A 01/21/2017   Procedure: Marcelino Duster LASER TURP (TRANSURETHRAL RESECTION OF PROSTATE) CAUTERIZATION OF BLADDER LESION;  Surgeon: Franchot Gallo, MD;  Location: [facility];  Service: Urology;  Laterality: N/A;       Family History  Problem Relation Age of Onset  . Stroke Father   . Peripheral vascular disease Father        amputation  . Heart failure Mother        CHF  . Coronary artery disease Mother   . Heart attack Mother 25       Multiple  . COPD Brother   . Heart disease Brother   . Cancer Brother        Bone  . Lung cancer Sister     Social History   Tobacco Use  . Smoking status: Former Smoker    Years: 3.00    Types: Cigarettes  . Smokeless tobacco: Never Used  . Tobacco comment: " Quit smoking by age 59; was a someday smoker "  Vaping Use  . Vaping Use: Never used  Substance Use Topics  . Alcohol use: No  . Drug use: No    Home Medications Prior to Admission medications   Medication Sig Start Date End Date Taking? Authorizing Provider  acetaminophen (TYLENOL) 650 MG CR tablet Take 650 mg by mouth 2 (two) times daily as needed for pain.   Yes [provider]  Amino Acids-Protein Hydrolys (FEEDING SUPPLEMENT, PRO-STAT SUGAR FREE 64,) LIQD Take 30 mLs by mouth in the morning and at bedtime.   Yes [provider]  Ascorbic Acid (VITAMIN C) 1000 MG tablet Take 1,000 mg by mouth daily.   Yes [provider]  atorvastatin (LIPITOR) 20 MG tablet Take 20 mg by mouth daily.   Yes [provider]  B Complex Vitamins (VITAMIN B COMPLEX PO) Take 1 tablet by mouth daily.   Yes [provider]  calcium carbonate (CALCIUM 600) 1500 (600 Ca) MG TABS tablet Take 600 mg of elemental calcium by mouth daily with breakfast.    Yes [provider]  Cholecalciferol (VITAMIN D3) 1.25 MG (50000 UT) CAPS Take 50,000 Units by mouth once a week. Saturday   Yes [provider]  cyanocobalamin 1000 MCG tablet Take 1,000 mg by mouth at bedtime.    Yes [provider]  docusate sodium (COLACE) 250 MG capsule  Take 250 mg by mouth daily as needed for constipation.    Yes [provider]  doxazosin (CARDURA) 1 MG tablet Take 1 tablet (1 mg total) by mouth at bedtime. 05/12/20  Yes Charlynne Cousins, MD  enzalutamide Gillermina Phy) 80 MG tablet Take 80 mg by mouth daily.   Yes [provider]  furosemide (LASIX) 40 MG tablet Take 1 tablet (40 mg total) by mouth daily as needed. Patient taking differently: Take 40 mg by mouth daily.  05/15/20  Yes Charlynne Cousins, MD  glimepiride (AMARYL) 4 MG tablet Take 1 tablet by mouth twice daily Patient taking differently: Take 4 mg by mouth in the morning and at bedtime.  06/03/19  Yes [name], MD  metFORMIN (GLUCOPHAGE) 500 MG tablet Take 1 tablet (500 mg total) by mouth daily with breakfast. 07/20/19  Yes Martinique, Peter M, MD  metoprolol succinate (TOPROL-XL) 100 MG 24 hr tablet Take 100 mg by mouth daily. Take with or immediately following a meal.   Yes [provider]  Multiple Vitamin (MULTIVITAMIN WITH MINERALS) TABS tablet Take 1 tablet by mouth daily.    Yes [provider]  ramipril (ALTACE) 10 MG capsule Take 1 capsule (10 mg total) by mouth daily. 05/16/20  Yes Charlynne Cousins, MD  tamsulosin (FLOMAX) 0.4 MG CAPS capsule Take 1 capsule (0.4 mg total) by mouth daily. Patient taking differently: Take 0.4 mg by mouth daily after supper.  06/25/19  Yes [name], MD  traMADol (ULTRAM) 50 MG tablet TAKE 1 TABLET BY MOUTH THREE TIMES DAILY AS NEEDED Patient taking differently: Take 50 mg by mouth every 8 (eight) hours as needed for moderate pain.  08/07/18  Yes [name], MD  atorvastatin (LIPITOR) 40 MG tablet Take 1/2 (one-half) tablet by mouth once daily Patient not taking: Reported on 06/09/2020 06/08/19   [name], MD  BAYER MICROLET LANCETS lancets Use to test blood sugar once daily dx: E11.40 10/18/15   [name], MD  Blood Glucose Monitoring Suppl (CONTOUR NEXT EZ MONITOR) w/Device KIT USE TO TEST BLOOD SUGAR ONCE DAILY. Dx Code E11.40 06/13/16   Viviana Simpler I, MD  glucose blood (CONTOUR NEXT TEST) test strip Check blood sugar once daily and as directed.Dx Code E11.40 03/19/19   [name], MD  nitroGLYCERIN (NITROSTAT) 0.4 MG SL tablet Place 1 tablet (0.4 mg total) under the tongue every 5 (five) minutes as needed for chest pain. Patient taking differently: Place 0.4 mg under the tongue every 5 (five) minutes as needed for chest pain. If no relief, call MD 05/15/18   [name], MD  OVER THE COUNTER MEDICATION Take 15 mLs by mouth in the morning and at bedtime. [employer]    [provider]  rivaroxaban (XARELTO) 20 MG TABS tablet Take 1 tablet (20 mg total) by mouth daily with supper. 01/21/20   Martinique, Peter M, MD    Allergies    Soma [carisoprodol] and Doxazosin  Review of Systems   Review of Systems  Constitutional: Negative for chills and fever.  HENT: Negative for congestion and rhinorrhea.   Eyes: Positive for visual disturbance.  Respiratory: Negative for cough and shortness of breath.   Cardiovascular: Negative for chest pain and palpitations.  Gastrointestinal: Negative for diarrhea, nausea and  vomiting.  Genitourinary: Negative for difficulty urinating and dysuria.  Musculoskeletal: Negative for arthralgias and back pain.  Skin: Negative for color change and rash.  Neurological: Positive for light-headedness.  Negative for headaches.    Physical Exam Updated Vital Signs BP (!) 168/80 (BP Location: Left Arm)   Pulse 68   Temp 97.6 F (36.4 C) (Oral)   Resp 16   Ht _0  (1.727 m)   Wt 71.7 kg   SpO2 100%   BMI 24.02 kg/m   Physical Exam Vitals and nursing note reviewed. Exam conducted with a chaperone present.  Constitutional:      General: He is not in acute distress.    Appearance: Normal appearance.  HENT:     Head: Normocephalic and atraumatic.     Nose: No rhinorrhea.  Eyes:     General:        Right eye: No discharge.        Left eye: No discharge.     Conjunctiva/sclera: Conjunctivae normal.  Cardiovascular:     Rate and Rhythm: Normal rate and regular rhythm.     Heart sounds: No murmur heard.   Pulmonary:     Effort: Pulmonary effort is normal.     Breath sounds: No stridor.  Abdominal:     General: Abdomen is flat. There is no distension.     Palpations: Abdomen is soft.     Tenderness: There is no abdominal tenderness.  Musculoskeletal:        General: No deformity or signs of injury.  Skin:    General: Skin is warm and dry.  Neurological:     General: No focal deficit present.     Mental Status: He is alert and oriented to person, place, and time. Mental status is at baseline.     Cranial Nerves: No cranial nerve deficit.     Sensory: No sensory deficit.     Motor: No weakness.  Psychiatric:        Mood and Affect: Mood normal.        Behavior: Behavior normal.        Thought Content: Thought content normal.     ED Results / Procedures / Treatments   Labs (all labs ordered are listed, but only abnormal results are displayed) Labs Reviewed  COMPREHENSIVE METABOLIC PANEL - Abnormal; Notable for the following components:      Result  Value   Potassium 3.4 (*)    Glucose, Bld 118 (*)    Calcium 8.0 (*)    Albumin 2.3 (*)    Alkaline Phosphatase 317 (*)    All other components within normal limits  LACTIC ACID, PLASMA - Abnormal; Notable for the following components:   Lactic Acid, Venous 2.1 (*)    All other components within normal limits  CBC WITH DIFFERENTIAL/PLATELET - Abnormal; Notable for the following components:   RBC 4.19 (*)    Hemoglobin 10.0 (*)    HCT 33.2 (*)    MCV 79.2 (*)    MCH 23.9 (*)    RDW 21.1 (*)    Abs Immature Granulocytes 0.31 (*)    All other components within normal limits  PROTIME-INR - Abnormal; Notable for the following components:   Prothrombin Time 17.2 (*)    INR 1.5 (*)    All other components within normal limits  URINALYSIS, ROUTINE W REFLEX MICROSCOPIC - Abnormal; Notable for the following components:   Bacteria, UA RARE (*)    All other components within normal limits  CBG MONITORING, ED - Abnormal; Notable for the following components:   Glucose-Capillary 109 (*)    All other components within normal limits  RESPIRATORY PANEL BY  RT PCR (FLU A&B, COVID)  CULTURE, BLOOD (ROUTINE X 2)  CULTURE, BLOOD (ROUTINE X 2)  LACTIC ACID, PLASMA  COMPREHENSIVE METABOLIC PANEL  MAGNESIUM  CBC WITH DIFFERENTIAL/PLATELET  TROPONIN I (HIGH SENSITIVITY)    EKG EKG Interpretation  Date/Time:  Thursday June 09 2020 14:55:05 EDT Ventricular Rate:  70 PR Interval:    QRS Duration: 86 QT Interval:  658 QTC Calculation: 716 R Axis:   -24 Text Interpretation: Sinus rhythm Ventricular premature complex Aberrant conduction of SV complex(es) Inferoposterior infarct, old Abnormal lateral Q waves Prolonged QT interval . Confirmed by Dewaine Conger [phone]) on 06/09/2020 3:41:22 PM   Radiology DG Chest 2 View  Result Date: 06/09/2020 CLINICAL DATA:  Suspected sepsis, discharge from hospital but blood pressure suddenly draft and patient became dizzy EXAM: CHEST - 2 VIEW COMPARISON:   Chest x-ray 05/11/2020, CT chest 05/01/2020 FINDINGS: The heart size and mediastinal contours are within normal limits. Coronary artery stent in grossly similar position. Lucency posterior to the left ventricle consistent with known hiatal hernia. No focal consolidation. No pulmonary edema. No pleural effusion. No pneumothorax. No acute osseous abnormality.  Degenerative changes of the spine. IMPRESSION: No active cardiopulmonary disease. Electronically Signed   By: Iven Finn M.D.   On: 06/09/2020 16:12    Procedures Ultrasound ED Peripheral IV (Provider)  Date/Time: 06/09/2020 4:53 PM Performed by: Breck Coons, MD Authorized by: Breck Coons, MD   Procedure details:    Indications: hydration, hypotension, multiple failed IV attempts and poor IV access     Skin Prep: chlorhexidine gluconate     Location:  Right AC   Angiocath:  18 G   Bedside Ultrasound Guided: Yes     Images: archived     Patient tolerated procedure without complications: Yes     Dressing applied: Yes     (including critical care time)  Medications Ordered in ED Medications  traMADol (ULTRAM) tablet 50 mg (has no administration in time range)  enzalutamide (XTANDI) tablet 80 mg (has no administration in time range)  atorvastatin (LIPITOR) tablet 20 mg (has no administration in time range)  metoprolol succinate (TOPROL-XL) 24 hr tablet 100 mg (has no administration in time range)  nitroGLYCERIN (NITROSTAT) SL tablet 0.4 mg (has no administration in time range)  ramipril (ALTACE) capsule 10 mg (has no administration in time range)  tamsulosin (FLOMAX) capsule 0.4 mg (has no administration in time range)  rivaroxaban (XARELTO) tablet 20 mg (has no administration in time range)  lactated ringers infusion (has no administration in time range)  acetaminophen (TYLENOL) tablet 650 mg (has no administration in time range)    Or  acetaminophen (TYLENOL) suppository 650 mg (has no administration in time range)    morphine 4 MG/ML injection 4 mg (has no administration in time range)  polyethylene glycol (MIRALAX / GLYCOLAX) packet 17 g (has no administration in time range)  ondansetron (ZOFRAN) tablet 4 mg (has no administration in time range)    Or  ondansetron (ZOFRAN) injection 4 mg (has no administration in time range)  hydrALAZINE (APRESOLINE) injection 10 mg (has no administration in time range)  lactated ringers bolus 1,000 mL (0 mLs Intravenous Stopped 06/09/20 1653)  traMADol (ULTRAM) tablet 50 mg (50 mg Oral Given 06/09/20 1657)  lactated ringers bolus 1,000 mL (0 mLs Intravenous Stopped 06/09/20 1848)  morphine 2 MG/ML injection 2 mg (2 mg Intravenous Given 06/09/20 2022)  lactated ringers bolus 1,000 mL (1,000 mLs Intravenous New Bag/Given (Non-Interop) 06/09/20 2032)  ED Course  I have reviewed the triage vital signs and the nursing notes.  Pertinent labs & imaging results that were available during my care of the patient were reviewed by me and considered in my medical decision making (see chart for details).  Clinical Course as of Jun 10 2311  Thu Jun 09, 2020  2035 Troponin I (High Sensitivity): 16 [EK]    Clinical Course User Index [EK] Breck Coons, MD   MDM Rules/Calculators/A&P                          86 year old male with recent discharge, having significant orthostatic hypotension.  Will get volume, will hypotension tachycardia on arrival is likely due to that however will screen for infectious origin.  Patient has no symptoms of infection,  Patient's blood pressures remained stable, he has a lactic acidosis to 2.1 but no source of infection.  Chest x-ray reviewed by myself radiology shows no acute cardiopulmonary pathology.  Hemoglobin stable kidney function stable.  Patient will be given a second liter of saline and then we will address his ability to ambulate without orthostasis.  Patient's lactic acid is improved after fluid hydration. He is feeling much better. We  attempted orthostatic vital signs again and his blood pressure dropped significantly. This is likely secondary to underlying multiple medical comorbidities to include advanced prostate cancer. He will need further hydration further evaluation, inpatient medicine is consulted and agrees to see this patient.  The patient will be admitted to the hospitalist.  For the remainder this patient's care please see inpatient team notes.  I will intervene as needed while the patient remains in the emergency department.  Final Clinical Impression(s) / ED Diagnoses Final diagnoses:  Orthostatic hypotension    Rx / DC Orders ED Discharge Orders    None       Breck Coons, MD 06/09/20 2312

## 2020-06-10 ENCOUNTER — Ambulatory Visit: Payer: Medicare Other | Admitting: Cardiology

## 2020-06-10 DIAGNOSIS — I2510 Atherosclerotic heart disease of native coronary artery without angina pectoris: Secondary | ICD-10-CM | POA: Diagnosis present

## 2020-06-10 DIAGNOSIS — Z8249 Family history of ischemic heart disease and other diseases of the circulatory system: Secondary | ICD-10-CM | POA: Diagnosis not present

## 2020-06-10 DIAGNOSIS — Z888 Allergy status to other drugs, medicaments and biological substances status: Secondary | ICD-10-CM | POA: Diagnosis not present

## 2020-06-10 DIAGNOSIS — E43 Unspecified severe protein-calorie malnutrition: Secondary | ICD-10-CM | POA: Diagnosis present

## 2020-06-10 DIAGNOSIS — R42 Dizziness and giddiness: Secondary | ICD-10-CM | POA: Diagnosis not present

## 2020-06-10 DIAGNOSIS — E1142 Type 2 diabetes mellitus with diabetic polyneuropathy: Secondary | ICD-10-CM | POA: Diagnosis present

## 2020-06-10 DIAGNOSIS — N1831 Chronic kidney disease, stage 3a: Secondary | ICD-10-CM | POA: Diagnosis present

## 2020-06-10 DIAGNOSIS — Z823 Family history of stroke: Secondary | ICD-10-CM | POA: Diagnosis not present

## 2020-06-10 DIAGNOSIS — Z8673 Personal history of transient ischemic attack (TIA), and cerebral infarction without residual deficits: Secondary | ICD-10-CM | POA: Diagnosis not present

## 2020-06-10 DIAGNOSIS — Z87891 Personal history of nicotine dependence: Secondary | ICD-10-CM | POA: Diagnosis not present

## 2020-06-10 DIAGNOSIS — L89151 Pressure ulcer of sacral region, stage 1: Secondary | ICD-10-CM | POA: Diagnosis present

## 2020-06-10 DIAGNOSIS — Z7901 Long term (current) use of anticoagulants: Secondary | ICD-10-CM | POA: Diagnosis not present

## 2020-06-10 DIAGNOSIS — D696 Thrombocytopenia, unspecified: Secondary | ICD-10-CM | POA: Diagnosis present

## 2020-06-10 DIAGNOSIS — C61 Malignant neoplasm of prostate: Secondary | ICD-10-CM | POA: Diagnosis present

## 2020-06-10 DIAGNOSIS — C7951 Secondary malignant neoplasm of bone: Secondary | ICD-10-CM | POA: Diagnosis present

## 2020-06-10 DIAGNOSIS — E1122 Type 2 diabetes mellitus with diabetic chronic kidney disease: Secondary | ICD-10-CM | POA: Diagnosis present

## 2020-06-10 DIAGNOSIS — Z66 Do not resuscitate: Secondary | ICD-10-CM | POA: Diagnosis present

## 2020-06-10 DIAGNOSIS — E1165 Type 2 diabetes mellitus with hyperglycemia: Secondary | ICD-10-CM | POA: Diagnosis present

## 2020-06-10 DIAGNOSIS — Z85828 Personal history of other malignant neoplasm of skin: Secondary | ICD-10-CM | POA: Diagnosis not present

## 2020-06-10 DIAGNOSIS — Z20822 Contact with and (suspected) exposure to covid-19: Secondary | ICD-10-CM | POA: Diagnosis present

## 2020-06-10 DIAGNOSIS — I951 Orthostatic hypotension: Secondary | ICD-10-CM | POA: Diagnosis present

## 2020-06-10 DIAGNOSIS — I472 Ventricular tachycardia: Secondary | ICD-10-CM | POA: Diagnosis present

## 2020-06-10 DIAGNOSIS — R55 Syncope and collapse: Secondary | ICD-10-CM | POA: Diagnosis not present

## 2020-06-10 DIAGNOSIS — I480 Paroxysmal atrial fibrillation: Secondary | ICD-10-CM | POA: Diagnosis present

## 2020-06-10 DIAGNOSIS — D6859 Other primary thrombophilia: Secondary | ICD-10-CM | POA: Diagnosis present

## 2020-06-10 DIAGNOSIS — I129 Hypertensive chronic kidney disease with stage 1 through stage 4 chronic kidney disease, or unspecified chronic kidney disease: Secondary | ICD-10-CM | POA: Diagnosis present

## 2020-06-10 LAB — COMPREHENSIVE METABOLIC PANEL
ALT: 21 U/L (ref 0–44)
AST: 30 U/L (ref 15–41)
Albumin: 2.2 g/dL — ABNORMAL LOW (ref 3.5–5.0)
Alkaline Phosphatase: 299 U/L — ABNORMAL HIGH (ref 38–126)
Anion gap: 11 (ref 5–15)
BUN: 16 mg/dL (ref 8–23)
CO2: 27 mmol/L (ref 22–32)
Calcium: 7 mg/dL — ABNORMAL LOW (ref 8.9–10.3)
Chloride: 102 mmol/L (ref 98–111)
Creatinine, Ser: 0.86 mg/dL (ref 0.61–1.24)
GFR calc Af Amer: >60 mL/min (ref >60)
GFR calc non Af Amer: >60 mL/min (ref >60)
Glucose, Bld: 95 mg/dL (ref 70–99)
Potassium: 3.2 mmol/L — ABNORMAL LOW (ref 3.5–5.1)
Sodium: 140 mmol/L (ref 135–145)
Total Bilirubin: 0.4 mg/dL (ref 0.3–1.2)
Total Protein: 6.7 g/dL (ref 6.5–8.1)

## 2020-06-10 LAB — TROPONIN I (HIGH SENSITIVITY)
Troponin I (High Sensitivity): 17 ng/L (ref <18)
Troponin I (High Sensitivity): 18 ng/L — ABNORMAL HIGH (ref <18)

## 2020-06-10 LAB — CBC WITH DIFFERENTIAL/PLATELET
Abs Immature Granulocytes: 0.41 10*3/uL — ABNORMAL HIGH (ref 0.00–0.07)
Basophils Absolute: 0.1 10*3/uL (ref 0.0–0.1)
Basophils Relative: 1 %
Eosinophils Absolute: 0 10*3/uL (ref 0.0–0.5)
Eosinophils Relative: 0 %
HCT: 31.2 % — ABNORMAL LOW (ref 39.0–52.0)
Hemoglobin: 9.3 g/dL — ABNORMAL LOW (ref 13.0–17.0)
Immature Granulocytes: 7 %
Lymphocytes Relative: 17 %
Lymphs Abs: 1 10*3/uL (ref 0.7–4.0)
MCH: 24.2 pg — ABNORMAL LOW (ref 26.0–34.0)
MCHC: 29.8 g/dL — ABNORMAL LOW (ref 30.0–36.0)
MCV: 81 fL (ref 80.0–100.0)
Monocytes Absolute: 0.4 10*3/uL (ref 0.1–1.0)
Monocytes Relative: 7 %
Neutro Abs: 4 10*3/uL (ref 1.7–7.7)
Neutrophils Relative %: 68 %
Platelets: 192 10*3/uL (ref 150–400)
RBC: 3.85 MIL/uL — ABNORMAL LOW (ref 4.22–5.81)
RDW: 21 % — ABNORMAL HIGH (ref 11.5–15.5)
WBC: 5.9 10*3/uL (ref 4.0–10.5)
nRBC: 0 % (ref 0.0–0.2)

## 2020-06-10 LAB — GLUCOSE, CAPILLARY
Glucose-Capillary: 148 mg/dL — ABNORMAL HIGH (ref 70–99)
Glucose-Capillary: 179 mg/dL — ABNORMAL HIGH (ref 70–99)
Glucose-Capillary: 56 mg/dL — ABNORMAL LOW (ref 70–99)
Glucose-Capillary: 84 mg/dL (ref 70–99)
Glucose-Capillary: 90 mg/dL (ref 70–99)
Glucose-Capillary: 96 mg/dL (ref 70–99)

## 2020-06-10 LAB — MAGNESIUM: Magnesium: 1.6 mg/dL — ABNORMAL LOW (ref 1.7–2.4)

## 2020-06-10 LAB — MRSA PCR SCREENING: MRSA by PCR: NEGATIVE

## 2020-06-10 MED ORDER — ADULT MULTIVITAMIN W/MINERALS CH
1.0000 | ORAL_TABLET | Freq: Every day | ORAL | Status: DC
  Administered 2020-06-10 – 2020-06-13 (×4): 1 via ORAL
  Filled 2020-06-10 (×4): qty 1

## 2020-06-10 MED ORDER — POTASSIUM CHLORIDE CRYS ER 20 MEQ PO TBCR
40.0000 meq | EXTENDED_RELEASE_TABLET | Freq: Once | ORAL | Status: AC
  Administered 2020-06-10: 40 meq via ORAL
  Filled 2020-06-10: qty 2

## 2020-06-10 MED ORDER — ENSURE ENLIVE PO LIQD
237.0000 mL | Freq: Three times a day (TID) | ORAL | Status: DC
  Administered 2020-06-10 – 2020-06-13 (×9): 237 mL via ORAL

## 2020-06-10 MED ORDER — LACTATED RINGERS IV SOLN: INTRAVENOUS | Status: AC

## 2020-06-10 NOTE — Evaluation (Signed)
Physical Therapy Evaluation Patient Details Name: Adrian Singh MRN: [id number] DOB: 08-Aug-1934 Today's Date: 06/10/2020   History of Present Illness  86 year old male with past medical history of coronary artery disease, paroxysmal atrial fibrillation on Xarelto, noninsulin-dependent diabetes mellitus type 2, recent acute stroke 04/2020, hypertension, chronic kidney disease stage IIIa and metastatic prostate cancer to bone who presents to [facility] emergency department with complaints of severe lightheadedness and weakness.  Pt admitted for orthostatic hypotension with hypertension  Clinical Impression  Pt admitted with above diagnosis.  Pt currently with functional limitations due to the deficits listed below (see PT Problem List). Pt will benefit from skilled PT to increase their independence and safety with mobility to allow discharge to the venue listed below.  Pt with dizziness upon sitting EOB however resolved.  Pt wearing TED hose and encouraged to perform UE punching motions prior to standing.  Pt unable to remain standing due to dizziness and reported weak LEs. BP upon return to sitting 97/52 mmHg.  BP upon return to supine 148/71 mmHg.  Pt was attempting to enter home (just d/c from SNF) and had presyncope episode leading to this admission.  Daughter present and stepped out of room to report that pt's spouse will not be agreeable to have anyone come in the home (so no HHPT if pt does d/c home).  Pt would benefit from SNF upon d/c due to current assist level and increased fall risk.     Follow Up Recommendations SNF;Supervision/Assistance - 24 hour    Equipment Recommendations  None recommended by PT    Recommendations for Other Services       Precautions / Restrictions Precautions Precautions: Fall Precaution Comments: orthostatic hypotension      Mobility  Bed Mobility Overal bed mobility: Needs Assistance Bed Mobility: Supine to Sit     Supine to sit: Min  assist     General bed mobility comments: assist for trunk; pt dizzy upon sitting; BP120/58 mmHg with sitting and HR 64 bpm  Transfers Overall transfer level: Needs assistance Equipment used: Rolling walker (2 wheeled) Transfers: Sit to/from Stand Sit to Stand: Mod assist         General transfer comment: assist to rise and steady, pt unable to maintain standing due to dizziness and weak LEs; unable to obtain BP in standing however upon return to sitting, BP 97/52 mmHg and HR 68 bpm  Ambulation/Gait                Stairs            Wheelchair Mobility    Modified Rankin (Stroke Patients Only)       Balance Overall balance assessment: Needs assistance         Standing balance support: Bilateral upper extremity supported Standing balance-Leahy Scale: Poor                               Pertinent Vitals/Pain Pain Assessment: No/denies pain    Home Living Family/patient expects to be discharged to:: Private residence Living Arrangements: Spouse/significant other Available Help at Discharge: Family;Available PRN/intermittently Type of Home: House Home Access: Stairs to enter Entrance Stairs-Rails: None Entrance Stairs-Number of Steps: 3 Home Layout: One level Home Equipment: Walker - 2 wheels;Cane - single point;Cane - quad;Bedside commode;Shower seat;Hand held shower head;Wheelchair - manual;Grab bars - toilet      Prior Function Level of Independence: Needs assistance   Gait / Transfers  Assistance Needed: just at SNF and discharged home the day he was admitted, was using RW for mobility however reports dizziness with mobility at SNF           Hand Dominance        Extremity/Trunk Assessment   Upper Extremity Assessment Upper Extremity Assessment: Generalized weakness    Lower Extremity Assessment Lower Extremity Assessment: Generalized weakness    Cervical / Trunk Assessment Cervical / Trunk Assessment: Kyphotic   Communication   Communication: No difficulties  Cognition Arousal/Alertness: Awake/alert Behavior During Therapy: WFL for tasks assessed/performed Overall Cognitive Status: Within Functional Limits for tasks assessed                                 General Comments: very pleasant and cooperative      General Comments      Exercises     Assessment/Plan    PT Assessment Patient needs continued PT services  PT Problem List Decreased strength;Decreased mobility;Decreased balance;Decreased activity tolerance       PT Treatment Interventions DME instruction;Gait training;Balance training;Therapeutic exercise;Functional mobility training;Therapeutic activities;Patient/family education;Wheelchair mobility training    PT Goals (Current goals can be found in the Care Plan section)  Acute Rehab PT Goals PT Goal Formulation: With patient Time For Goal Achievement: 06/24/20 Potential to Achieve Goals: Good    Frequency Min 2X/week   Barriers to discharge        Co-evaluation               AM-PAC PT "6 Clicks" Mobility  Outcome Measure Help needed turning from your back to your side while in a flat bed without using bedrails?: A Little Help needed moving from lying on your back to sitting on the side of a flat bed without using bedrails?: A Little Help needed moving to and from a bed to a chair (including a wheelchair)?: A Lot Help needed standing up from a chair using your arms (e.g., wheelchair or bedside chair)?: A Lot Help needed to walk in hospital room?: A Lot Help needed climbing 3-5 steps with a railing? : Total 6 Click Score: 13    End of Session Equipment Utilized During Treatment: Gait belt Activity Tolerance: Treatment limited secondary to medical complications (Comment) (dizziness) Patient left: in bed;with call bell/phone within reach;with family/visitor present Nurse Communication: Mobility status PT Visit Diagnosis: Difficulty in walking,  not elsewhere classified (R26.2)    Time: 1342-1410 PT Time Calculation (min) (ACUTE ONLY): 28 min   Charges:   PT Evaluation $PT Eval Low Complexity: 1 Low PT Treatments $Therapeutic Activity: 8-22 mins       Jannette Spanner PT, DPT Acute Rehabilitation Services Pager: [phone] Office: [phone]  York Ram E 06/10/2020, 3:56 PM

## 2020-06-10 NOTE — Progress Notes (Signed)
Initial Nutrition Assessment  DOCUMENTATION CODES:   Not applicable  INTERVENTION:  Recommend liberalizing diet to regular  Ensure Enlive po TID, each supplement provides 350 kcal and 20 grams of protein  MVI daily   NUTRITION DIAGNOSIS:   Inadequate oral intake related to decreased appetite as evidenced by other (comment) (Per MST report).    GOAL:   Patient will meet greater than or equal to 90% of their needs    MONITOR:   PO intake, Supplement acceptance, Labs, Weight trends, I & O's  REASON FOR ASSESSMENT:   Malnutrition Screening Tool    ASSESSMENT:   Pt admitted with postural dizziness with presyncope. PMH includes CAD, Afib, type 2 DM, recent stroke (04/2020), HTN, CKD stage 3, metastatic prostate Ca.  Pt reports decreased appetite and associated weight loss over the last 2 months.   Per wt readings, pt weighed 76.2 kg on 04/29/20. Pt now weighs 66.1 kg. This indicates a 13%wt loss x1 month, which is significant for time frame. Suspect pt is malnourished; however, unable to diagnose without nutrition-focused physical exam. Will attempt at follow-up.  PO Intake: 75% x 1 recorded meal  Labs: K+ 3.2 (L), Mg 1.6 (L), CBGs [id number] Medications: Klor-con  Diet Order:   Diet Order            Diet heart healthy/carb modified Room service appropriate? Yes; Fluid consistency: Thin  Diet effective now                 EDUCATION NEEDS:   No education needs have been identified at this time  Skin:  Skin Assessment: Reviewed RN Assessment  Last BM:  9/24  Height:   Ht Readings from Last 1 Encounters:  06/10/20 5\' 10"  (1.778 m)    Weight:   Wt Readings from Last 1 Encounters:  06/10/20 66.1 kg    BMI:  Body mass index is 20.91 kg/m.  Estimated Nutritional Needs:   Kcal:  1950-2150  Protein:  95-105 grams  Fluid:  >/=1.95L/d    Adrian Ina, MS, RD, LDN RD pager number and weekend/on-call pager number located in [place].

## 2020-06-10 NOTE — Progress Notes (Signed)
PROGRESS NOTE    Adrian Singh  XTG:[id number] DOB: 1934-02-19 DOA: 06/09/2020 PCP: [name], MD    Brief Narrative: 86 year old male with past medical history of coronary artery disease, paroxysmal atrial fibrillation on Xarelto, noninsulin-dependent diabetes mellitus type 2, recent acute stroke 04/2020, hypertension, chronic kidney disease stage IIIa and metastatic prostate cancer to bone who presents to [facility] emergency department with complaints of severe lightheadedness and weakness.  Since the patient's recent discharge from [facility] in August, patient has been undergoing rehabilitation in a local skilled nursing facility.  Patient explains that over the past several weeks patient has participated with physical therapy has been experiencing episodes of intense lightheadedness.  Patient describes these episodes of lightheadedness as "feeling like I am going to pass out."  He states that typically these episodes occur with attempting to rise from a seated position and are associated with generalized weakness.  Upon further questioning patient also complains of some associated palpitations as well as occasional vague chest discomfort.  Chest discomfort is brief, mild and midsternal in location.  Denies recently initiated medications.  Patient denies sick contacts, recent travel or confirmed contact with known COVID-19 infection.  Patient was discharged home from his skilled nursing facility the morning of 9/23.  Upon arriving home, the daughter noticed that the patient nearly passed out after getting out of the car to walk to the house.  This prompted the patient being brought to [facility] emergency department for evaluation.  Upon evaluation in emergency department, serial orthostatic vital signs were positive.  Patient was hydrated with 1 L of normal saline with minimal improvement of symptoms.  Due to patient's complicated medical history and  ongoing symptoms the emergency department provider has called the hospitalist group to evaluate the patient for admission to the hospital.   Assessment & Plan:   Principal Problem:   Postural dizziness with presyncope Active Problems:   Essential hypertension   Uncontrolled type 2 diabetes mellitus with diabetic polyneuropathy, without long-term current use of insulin (HCC)   PAF (paroxysmal atrial fibrillation) (HCC)   Orthostatic hypotension   Primary malignant neoplasm of prostate metastatic to bone (HCC)   Chronic kidney disease, stage 3a   Protein-calorie malnutrition, severe  #1 orthostatic hypotension with hypertension patient admitted with presyncope and is clearly orthostatic during the hospital stay. This could be related to decreased oral intake, use of Lasix and Cardura and autonomic neuropathy from longstanding diabetes. We will continue slow IV hydration with TED hose. His blood pressure lying down was 160/81 with standing up 77/61 I will hold the Flomax and continue Cardura PT consult Review of his outside records show that patient has been deteriorating for over a month or 2 to with decreased appetite weight loss.  This was thought to be may be secondary to [facility] that he is taking at home for prostate cancer.  #2 HTN continue metoprolol  #3type 2 dm-on Metformin and glyburide prior to admission continue SSI CBG (last 3)  Recent Labs    06/10/20 0759 06/10/20 0829 06/10/20 1215  GLUCAP 56* 90 148*     #4  Chronic PAF on xarelto Rate controlled on metoprolol  #5 CKD stage IIIa stable monitor  #6 history of prostate malignancy on Xtandi  #7 CVA August 2021 continue Xarelto and Lipitor  #8 stage I pressure ulcer on coccyx present on admission  #9 acquired thrombophilia with chronic A. fib high risk for stroke on Xarelto   Pressure Injury  05/03/20 Coccyx Stage 1 -  Intact skin with non-blanchable redness of a localized area usually over a bony  prominence. pink (Active)  05/03/20 0908  Location: Coccyx  Location Orientation:   Staging: Stage 1 -  Intact skin with non-blanchable redness of a localized area usually over a bony prominence.  Wound Description (Comments): pink  Present on Admission:    Estimated body mass index is 20.91 kg/m as calculated from the following:   Height as of this encounter: 5\' 10"  (1.778 m).   Weight as of this encounter: 66.1 kg.  DVT prophylaxis: xarelto Code Status: full Family Communication: none at bedside Disposition Plan:  Status DT:[id number]  Dispo: The patient is from: SNF              Anticipated d/c is to: SNF              Anticipated d/c date is: 2 days              Patient currently is not medically stable to d/c.  Patient is extremely orthostatic and is on IV fluids    Consultants:   none  Procedures:none Antimicrobials:none  Subjective: Patient is resting in bed overnight staff concerned about low blood pressure on standing patient came from nursing home where he stayed for over 24 days he has decreased p.o. intake he says he doesn't like the food there and has no appetite he has been discharged from [facility] after a recent stroke prior to that he lived at home  Patient very dizzy on standing up  Objective: Vitals:   06/10/20 0304 06/10/20 0600 06/10/20 0656 06/10/20 1059  BP: 123/76  (!) 160/81 (!) 159/77  Pulse: 66  66 66  Resp: 18  14 18   Temp: (!) 97.5 F (36.4 C)  98 F (36.7 C) 97.7 F (36.5 C)  TempSrc: Oral  Oral Oral  SpO2: 98%  99% 97%  Weight:  66.1 kg    Height:  5\' 10"  (1.778 m)      Intake/Output Summary (Last 24 hours) at 06/10/2020 1248 Last data filed at 06/10/2020 0925 Gross per 24 hour  Intake 1416.14 ml  Output 500 ml  Net 916.14 ml   Filed Weights   06/09/20 1456 06/10/20 0600  Weight: 71.7 kg 66.1 kg    Examination:  General exam: Appears calm and comfortable  Respiratory system: Clear to auscultation. Respiratory effort  normal. Cardiovascular system: S1 & S2 heard, RRR. No JVD, murmurs, rubs, gallops or clicks. No pedal edema. Gastrointestinal system: Abdomen is nondistended, soft and nontender. No organomegaly or masses felt. Normal bowel sounds heard. Central nervous system: Alert and oriented. No focal neurological deficits. Extremities: Symmetric 5 x 5 power. Skin: No rashes, lesions or ulcers Psychiatry: Judgement and insight appear normal. Mood & affect appropriate.     Data Reviewed: I have personally reviewed following labs and imaging studies  CBC: Recent Labs  Lab 06/09/20 1521 06/10/20 0049  WBC 6.4 5.9  NEUTROABS 4.6 4.0  HGB 10.0* 9.3*  HCT 33.2* 31.2*  MCV 79.2* 81.0  PLT 222 998   Basic Metabolic Panel: Recent Labs  Lab 06/09/20 1521 06/10/20 0049  NA 142 140  K 3.4* 3.2*  CL 99 102  CO2 28 27  GLUCOSE 118* 95  BUN 18 16  CREATININE 1.00 0.86  CALCIUM 8.0* 7.0*  MG  --  1.6*   GFR: Estimated Creatinine Clearance: 60.8 mL/min (by C-G formula based on SCr of 0.86 mg/dL). Liver  Function Tests: Recent Labs  Lab 06/09/20 1521 06/10/20 0049  AST 35 30  ALT 27 21  ALKPHOS 317* 299*  BILITOT 0.5 0.4  PROT 7.7 6.7  ALBUMIN 2.3* 2.2*   No results for input(s): LIPASE, AMYLASE in the last 168 hours. No results for input(s): AMMONIA in the last 168 hours. Coagulation Profile: Recent Labs  Lab 06/09/20 1521  INR 1.5*   Cardiac Enzymes: No results for input(s): CKTOTAL, CKMB, CKMBINDEX, TROPONINI in the last 168 hours. BNP (last 3 results) No results for input(s): PROBNP in the last 8760 hours. HbA1C: No results for input(s): HGBA1C in the last 72 hours. CBG: Recent Labs  Lab 06/09/20 1455 06/10/20 0011 06/10/20 0759 06/10/20 0829 06/10/20 1215  GLUCAP 109* 96 56* 90 148*   Lipid Profile: No results for input(s): CHOL, HDL, LDLCALC, TRIG, CHOLHDL, LDLDIRECT in the last 72 hours. Thyroid Function Tests: No results for input(s): TSH, T4TOTAL, FREET4,  T3FREE, THYROIDAB in the last 72 hours. Anemia Panel: No results for input(s): VITAMINB12, FOLATE, FERRITIN, TIBC, IRON, RETICCTPCT in the last 72 hours. Sepsis Labs: Recent Labs  Lab 06/09/20 1521 06/09/20 1900  LATICACIDVEN 2.1* 1.5    Recent Results (from the past 240 hour(s))  Respiratory Panel by RT PCR (Flu A&B, Covid) - Nasopharyngeal Swab     Status: None   Collection Time: 06/09/20  3:40 PM   Specimen: Nasopharyngeal Swab  Result Value Ref Range Status   SARS Coronavirus 2 by RT PCR NEGATIVE NEGATIVE Final    Comment: (NOTE) SARS-CoV-2 target nucleic acids are NOT DETECTED.  The SARS-CoV-2 RNA is generally detectable in upper respiratoy specimens during the acute phase of infection. The lowest concentration of SARS-CoV-2 viral copies this assay can detect is 131 copies/mL. A negative result does not preclude SARS-Cov-2 infection and should not be used as the sole basis for treatment or other patient management decisions. A negative result may occur with  improper specimen collection/handling, submission of specimen other than nasopharyngeal swab, presence of viral mutation(s) within the areas targeted by this assay, and inadequate number of viral copies (<131 copies/mL). A negative result must be combined with clinical observations, patient history, and epidemiological information. The expected result is Negative.  Fact Sheet for Patients:  PinkCheek.be  Fact Sheet for Healthcare Providers:  GravelBags.it  This test is no t yet approved or cleared by the [place] FDA and  has been authorized for detection and/or diagnosis of SARS-CoV-2 by FDA under an Emergency Use Authorization (EUA). This EUA will remain  in effect (meaning this test can be used) for the duration of the COVID-19 declaration under Section 564(b)(1) of the Act, 21 U.S.C. section 360bbb-3(b)(1), unless the authorization is terminated  or revoked sooner.     Influenza A by PCR NEGATIVE NEGATIVE Final   Influenza B by PCR NEGATIVE NEGATIVE Final    Comment: (NOTE) The Xpert Xpress SARS-CoV-2/FLU/RSV assay is intended as an aid in  the diagnosis of influenza from Nasopharyngeal swab specimens and  should not be used as a sole basis for treatment. Nasal washings and  aspirates are unacceptable for Xpert Xpress SARS-CoV-2/FLU/RSV  testing.  Fact Sheet for Patients: PinkCheek.be  Fact Sheet for Healthcare Providers: GravelBags.it  This test is not yet approved or cleared by the [place] FDA and  has been authorized for detection and/or diagnosis of SARS-CoV-2 by  FDA under an Emergency Use Authorization (EUA). This EUA will remain  in effect (meaning this test can be used) for the  duration of the  Covid-19 declaration under Section 564(b)(1) of the Act, 21  U.S.C. section 360bbb-3(b)(1), unless the authorization is  terminated or revoked. Performed at [facility], [place] [address], [place], [place] [address]   MRSA PCR Screening     Status: None   Collection Time: 06/10/20 12:02 AM   Specimen: Nasal Mucosa; Nasopharyngeal  Result Value Ref Range Status   MRSA by PCR NEGATIVE NEGATIVE Final    Comment:        The GeneXpert MRSA Assay (FDA approved for NASAL specimens only), is one component of a comprehensive MRSA colonization surveillance program. It is not intended to diagnose MRSA infection nor to guide or monitor treatment for MRSA infections. Performed at [facility], [place] [address], [place], [place] [address]          Radiology Studies: DG Chest 2 View  Result Date: 06/09/2020 CLINICAL DATA:  Suspected sepsis, discharge from hospital but blood pressure suddenly draft and patient became dizzy EXAM: CHEST - 2 VIEW COMPARISON:  Chest x-ray 05/11/2020, CT chest 05/01/2020 FINDINGS: The heart size  and mediastinal contours are within normal limits. Coronary artery stent in grossly similar position. Lucency posterior to the left ventricle consistent with known hiatal hernia. No focal consolidation. No pulmonary edema. No pleural effusion. No pneumothorax. No acute osseous abnormality.  Degenerative changes of the spine. IMPRESSION: No active cardiopulmonary disease. Electronically Signed   By: Iven Finn M.D.   On: 06/09/2020 16:12        Scheduled Meds: . atorvastatin  20 mg Oral Daily  . enzalutamide  80 mg Oral Daily  . metoprolol succinate  100 mg Oral Daily  . ramipril  10 mg Oral Daily  . rivaroxaban  20 mg Oral Q supper  . tamsulosin  0.4 mg Oral QPC supper   Continuous Infusions: . lactated ringers 100 mL/hr at 06/10/20 1033     LOS: 0 days     Georgette Shell, MD 06/10/2020, 12:48 PM

## 2020-06-11 MED ORDER — METOPROLOL SUCCINATE ER 50 MG PO TB24
50.0000 mg | ORAL_TABLET | Freq: Every day | ORAL | Status: DC
  Administered 2020-06-12 – 2020-06-13 (×2): 50 mg via ORAL
  Filled 2020-06-11 (×2): qty 1

## 2020-06-11 MED ORDER — RAMIPRIL 2.5 MG PO CAPS
2.5000 mg | ORAL_CAPSULE | Freq: Every day | ORAL | Status: DC
  Administered 2020-06-12 – 2020-06-13 (×2): 2.5 mg via ORAL
  Filled 2020-06-11 (×2): qty 1

## 2020-06-11 NOTE — Progress Notes (Signed)
PROGRESS NOTE    Adrian Singh  ZTI:[id number] DOB: 10-11-1933 DOA: 06/09/2020 PCP: [name], MD    Brief Narrative: 86 year old male with past medical history of coronary artery disease, paroxysmal atrial fibrillation on Xarelto, noninsulin-dependent diabetes mellitus type 2, recent acute stroke 04/2020, hypertension, chronic kidney disease stage IIIa and metastatic prostate cancer to bone who presents to [facility] emergency department with complaints of severe lightheadedness and weakness.  Since the patient's recent discharge from [facility] in August, patient has been undergoing rehabilitation in a local skilled nursing facility.  Patient explains that over the past several weeks patient has participated with physical therapy has been experiencing episodes of intense lightheadedness.  Patient describes these episodes of lightheadedness as "feeling like I am going to pass out."  He states that typically these episodes occur with attempting to rise from a seated position and are associated with generalized weakness.  Upon further questioning patient also complains of some associated palpitations as well as occasional vague chest discomfort.  Chest discomfort is brief, mild and midsternal in location.  Denies recently initiated medications.  Patient denies sick contacts, recent travel or confirmed contact with known COVID-19 infection.  Patient was discharged home from his skilled nursing facility the morning of 9/23.  Upon arriving home, the daughter noticed that the patient nearly passed out after getting out of the car to walk to the house.  This prompted the patient being brought to [facility] emergency department for evaluation.  Upon evaluation in emergency department, serial orthostatic vital signs were positive.  Patient was hydrated with 1 L of normal saline with minimal improvement of symptoms.  Due to patient's complicated medical history and  ongoing symptoms the emergency department provider has called the hospitalist group to evaluate the patient for admission to the hospital.   Assessment & Plan:   Principal Problem:   Postural dizziness with presyncope Active Problems:   Essential hypertension   Uncontrolled type 2 diabetes mellitus with diabetic polyneuropathy, without long-term current use of insulin (HCC)   PAF (paroxysmal atrial fibrillation) (HCC)   Orthostatic hypotension   Primary malignant neoplasm of prostate metastatic to bone (HCC)   Chronic kidney disease, stage 3a   Protein-calorie malnutrition, severe  #1 orthostatic hypotension with hypertension patient admitted with presyncope and is clearly orthostatic during the hospital stay. This could be related to decreased oral intake, use of Lasix and Cardura and autonomic neuropathy from longstanding diabetes. Patient continues to be dizzy upon standing.  Patient continues to be orthostatic.  Still on normal saline at 100 cc/h.  Monitor him closely for any fluid overload.  Lasix on hold.  She was seen by physical therapy recommending skilled nursing facility on discharge. I will hold the Flomax and continue Cardura PT consult REC SNF Review of his outside records show that patient has been deteriorating for over a month or 2 to with decreased appetite weight loss.  This was thought to be may be secondary to [facility] that he is taking at home for prostate cancer.  #2 HTN-blood pressure soft today decrease metoprolol and decrease the dose of ACE inhibitor.    #3type 2 dm-on Metformin and glyburide prior to admission continue SSI CBG (last 3)  Recent Labs    06/10/20 1215 06/10/20 1645 06/10/20 2001  GLUCAP 148* 84 179*     #4  Chronic PAF on xarelto Rate controlled on metoprolol  #5 CKD stage IIIa stable monitor   #6 history of prostate  malignancy on Xtandi  #7 CVA August 2021 continue Xarelto and Lipitor  #8 stage I pressure ulcer on coccyx present on  admission  #9 acquired thrombophilia with chronic A. fib high risk for stroke on Xarelto   Pressure Injury 05/03/20 Coccyx Stage 1 -  Intact skin with non-blanchable redness of a localized area usually over a bony prominence. pink (Active)  05/03/20 0908  Location: Coccyx  Location Orientation:   Staging: Stage 1 -  Intact skin with non-blanchable redness of a localized area usually over a bony prominence.  Wound Description (Comments): pink  Present on Admission:    Estimated body mass index is 20.91 kg/m as calculated from the following:   Height as of this encounter: 5\' 10"  (1.778 m).   Weight as of this encounter: 66.1 kg.  DVT prophylaxis: xarelto Code Status: full Family Communication: none at bedside Disposition Plan:  Status HK:[id number]  Dispo: The patient is from: SNF              Anticipated d/c is to: SNF              Anticipated d/c date is: 2 days              Patient currently is not medically stable to d/c.  Patient is extremely orthostatic and is on IV fluids    Consultants:   none  Procedures:none Antimicrobials:none  Subjective: Son is by the bedside patient continues to be dizzy and orthostatic on IV fluids Objective: Vitals:   06/10/20 1059 06/10/20 1959 06/11/20 0530 06/11/20 1132  BP: (!) 159/77 (!) 142/67 (!) 161/82 112/62  Pulse: 66 70 68 65  Resp: 18 16 15 20   Temp: 97.7 F (36.5 C) 98.4 F (36.9 C) 97.9 F (36.6 C) 98.3 F (36.8 C)  TempSrc: Oral Oral Oral Axillary  SpO2: 97% 96% 97% 97%  Weight:      Height:        Intake/Output Summary (Last 24 hours) at 06/11/2020 1252 Last data filed at 06/11/2020 1000 Gross per 24 hour  Intake 1839.88 ml  Output 1300 ml  Net 539.88 ml   Filed Weights   06/09/20 1456 06/10/20 0600  Weight: 71.7 kg 66.1 kg    Examination:  General exam: Appears calm and comfortable  Respiratory system: Clear to auscultation. Respiratory effort normal. Cardiovascular system: S1 & S2 heard, RRR. No JVD,  murmurs, rubs, gallops or clicks. No pedal edema. Gastrointestinal system: Abdomen is nondistended, soft and nontender. No organomegaly or masses felt. Normal bowel sounds heard. Central nervous system: Alert and oriented. No focal neurological deficits. Extremities: Symmetric 5 x 5 power. Skin: No rashes, lesions or ulcers Psychiatry: Judgement and insight appear normal. Mood & affect appropriate.     Data Reviewed: I have personally reviewed following labs and imaging studies  CBC: Recent Labs  Lab 06/09/20 1521 06/10/20 0049  WBC 6.4 5.9  NEUTROABS 4.6 4.0  HGB 10.0* 9.3*  HCT 33.2* 31.2*  MCV 79.2* 81.0  PLT 222 756   Basic Metabolic Panel: Recent Labs  Lab 06/09/20 1521 06/10/20 0049  NA 142 140  K 3.4* 3.2*  CL 99 102  CO2 28 27  GLUCOSE 118* 95  BUN 18 16  CREATININE 1.00 0.86  CALCIUM 8.0* 7.0*  MG  --  1.6*   GFR: Estimated Creatinine Clearance: 60.8 mL/min (by C-G formula based on SCr of 0.86 mg/dL). Liver Function Tests: Recent Labs  Lab 06/09/20 1521 06/10/20 0049  AST 35 30  ALT 27 21  ALKPHOS 317* 299*  BILITOT 0.5 0.4  PROT 7.7 6.7  ALBUMIN 2.3* 2.2*   No results for input(s): LIPASE, AMYLASE in the last 168 hours. No results for input(s): AMMONIA in the last 168 hours. Coagulation Profile: Recent Labs  Lab 06/09/20 1521  INR 1.5*   Cardiac Enzymes: No results for input(s): CKTOTAL, CKMB, CKMBINDEX, TROPONINI in the last 168 hours. BNP (last 3 results) No results for input(s): PROBNP in the last 8760 hours. HbA1C: No results for input(s): HGBA1C in the last 72 hours. CBG: Recent Labs  Lab 06/10/20 0759 06/10/20 0829 06/10/20 1215 06/10/20 1645 06/10/20 2001  GLUCAP 56* 90 148* 84 179*   Lipid Profile: No results for input(s): CHOL, HDL, LDLCALC, TRIG, CHOLHDL, LDLDIRECT in the last 72 hours. Thyroid Function Tests: No results for input(s): TSH, T4TOTAL, FREET4, T3FREE, THYROIDAB in the last 72 hours. Anemia Panel: No  results for input(s): VITAMINB12, FOLATE, FERRITIN, TIBC, IRON, RETICCTPCT in the last 72 hours. Sepsis Labs: Recent Labs  Lab 06/09/20 1521 06/09/20 1900  LATICACIDVEN 2.1* 1.5    Recent Results (from the past 240 hour(s))  Respiratory Panel by RT PCR (Flu A&B, Covid) - Nasopharyngeal Swab     Status: None   Collection Time: 06/09/20  3:40 PM   Specimen: Nasopharyngeal Swab  Result Value Ref Range Status   SARS Coronavirus 2 by RT PCR NEGATIVE NEGATIVE Final    Comment: (NOTE) SARS-CoV-2 target nucleic acids are NOT DETECTED.  The SARS-CoV-2 RNA is generally detectable in upper respiratoy specimens during the acute phase of infection. The lowest concentration of SARS-CoV-2 viral copies this assay can detect is 131 copies/mL. A negative result does not preclude SARS-Cov-2 infection and should not be used as the sole basis for treatment or other patient management decisions. A negative result may occur with  improper specimen collection/handling, submission of specimen other than nasopharyngeal swab, presence of viral mutation(s) within the areas targeted by this assay, and inadequate number of viral copies (<131 copies/mL). A negative result must be combined with clinical observations, patient history, and epidemiological information. The expected result is Negative.  Fact Sheet for Patients:  PinkCheek.be  Fact Sheet for Healthcare Providers:  GravelBags.it  This test is no t yet approved or cleared by the [place] FDA and  has been authorized for detection and/or diagnosis of SARS-CoV-2 by FDA under an Emergency Use Authorization (EUA). This EUA will remain  in effect (meaning this test can be used) for the duration of the COVID-19 declaration under Section 564(b)(1) of the Act, 21 U.S.C. section 360bbb-3(b)(1), unless the authorization is terminated or revoked sooner.     Influenza A by PCR NEGATIVE  NEGATIVE Final   Influenza B by PCR NEGATIVE NEGATIVE Final    Comment: (NOTE) The Xpert Xpress SARS-CoV-2/FLU/RSV assay is intended as an aid in  the diagnosis of influenza from Nasopharyngeal swab specimens and  should not be used as a sole basis for treatment. Nasal washings and  aspirates are unacceptable for Xpert Xpress SARS-CoV-2/FLU/RSV  testing.  Fact Sheet for Patients: PinkCheek.be  Fact Sheet for Healthcare Providers: GravelBags.it  This test is not yet approved or cleared by the [place] FDA and  has been authorized for detection and/or diagnosis of SARS-CoV-2 by  FDA under an Emergency Use Authorization (EUA). This EUA will remain  in effect (meaning this test can be used) for the duration of the  Covid-19 declaration under Section 564(b)(1) of the Act, 21  U.S.C.  section 360bbb-3(b)(1), unless the authorization is  terminated or revoked. Performed at [facility], [place] [address], [place], [place] [address]   MRSA PCR Screening     Status: None   Collection Time: 06/10/20 12:02 AM   Specimen: Nasal Mucosa; Nasopharyngeal  Result Value Ref Range Status   MRSA by PCR NEGATIVE NEGATIVE Final    Comment:        The GeneXpert MRSA Assay (FDA approved for NASAL specimens only), is one component of a comprehensive MRSA colonization surveillance program. It is not intended to diagnose MRSA infection nor to guide or monitor treatment for MRSA infections. Performed at [facility], [place] [address] [place], [place] [address]          Radiology Studies: DG Chest 2 View  Result Date: 06/09/2020 CLINICAL DATA:  Suspected sepsis, discharge from hospital but blood pressure suddenly draft and patient became dizzy EXAM: CHEST - 2 VIEW COMPARISON:  Chest x-ray 05/11/2020, CT chest 05/01/2020 FINDINGS: The heart size and mediastinal contours are within normal limits.  Coronary artery stent in grossly similar position. Lucency posterior to the left ventricle consistent with known hiatal hernia. No focal consolidation. No pulmonary edema. No pleural effusion. No pneumothorax. No acute osseous abnormality.  Degenerative changes of the spine. IMPRESSION: No active cardiopulmonary disease. Electronically Signed   By: Iven Finn M.D.   On: 06/09/2020 16:12        Scheduled Meds: . atorvastatin  20 mg Oral Daily  . enzalutamide  80 mg Oral Daily  . feeding supplement (ENSURE ENLIVE)  237 mL Oral TID BM  . metoprolol succinate  100 mg Oral Daily  . multivitamin with minerals  1 tablet Oral Daily  . ramipril  10 mg Oral Daily  . rivaroxaban  20 mg Oral Q supper   Continuous Infusions: . lactated ringers 100 mL/hr at 06/11/20 0546     LOS: 1 day     Georgette Shell, MD 06/11/2020, 12:52 PM

## 2020-06-11 NOTE — Progress Notes (Signed)
Physical Therapy Treatment Patient Details Name: Adrian Singh MRN: [id number] DOB: 1934-01-17 Today's Date: 06/11/2020    History of Present Illness 86 year old male with past medical history of coronary artery disease, paroxysmal atrial fibrillation on Xarelto, noninsulin-dependent diabetes mellitus type 2, recent acute stroke 04/2020, hypertension, chronic kidney disease stage IIIa and metastatic prostate cancer to bone who presents to [facility] emergency department with complaints of severe lightheadedness and weakness.  Pt admitted for orthostatic hypotension with hypertension    PT Comments    Progressing slowly. Pt is still experiencing orthostatic hypotension-see vitals section. He did not c/o lightheadedness/dizziness during session on today. Son was present during session. Plan is for pt to return to SNF for continued rehab to improve functional mobility and safety.     Follow Up Recommendations  SNF     Equipment Recommendations  None recommended by PT    Recommendations for Other Services       Precautions / Restrictions Precautions Precautions: Fall Precaution Comments: orthostatic hypotension Restrictions Weight Bearing Restrictions: No    Mobility  Bed Mobility Overal bed mobility: Needs Assistance Bed Mobility: Supine to Sit     Supine to sit: Min assist;HOB elevated     General bed mobility comments: Assist for trunk. Increased time. Pt denied dizziness. Sat EOB for several minutes with pt tapping toes.  Transfers Overall transfer level: Needs assistance Equipment used: Rolling walker (2 wheeled) Transfers: Sit to/from [facility] Sit to Stand: Mod assist Stand pivot transfers: Min assist       General transfer comment: Assist to power up, stabilize, control descent. Mod assist from lower surfaces (pt was actually able to rise from elevated bed without assistance). Cues for safety, technique, hand placement. Stand pivot,  bed to bsc, with RW.  Ambulation/Gait Ambulation/Gait assistance: Min assist;+2 safety/equipment Gait Distance (Feet): 4 Feet Assistive device: Rolling walker (2 wheeled) Gait Pattern/deviations: Step-through pattern;Decreased stride length;Trunk flexed     General Gait Details: Assist to steady pt. Cues for safety. Pt made a few steps over to recliner. He denied lightheadedness/dizziness.   Stairs             Wheelchair Mobility    Modified Rankin (Stroke Patients Only)       Balance Overall balance assessment: Needs assistance;History of Falls         Standing balance support: Bilateral upper extremity supported Standing balance-Leahy Scale: Poor                              Cognition Arousal/Alertness: Awake/alert Behavior During Therapy: WFL for tasks assessed/performed Overall Cognitive Status: Within Functional Limits for tasks assessed                                        Exercises Total Joint Exercises Ankle Circles/Pumps: AROM;Both;20 reps    General Comments        Pertinent Vitals/Pain Pain Assessment: No/denies pain    Home Living                      Prior Function            PT Goals (current goals can now be found in the care plan section) Progress towards PT goals: Progressing toward goals    Frequency    Min 2X/week  PT Plan Current plan remains appropriate    Co-evaluation              AM-PAC PT "6 Clicks" Mobility   Outcome Measure  Help needed turning from your back to your side while in a flat bed without using bedrails?: A Little Help needed moving from lying on your back to sitting on the side of a flat bed without using bedrails?: A Little Help needed moving to and from a bed to a chair (including a wheelchair)?: A Lot Help needed standing up from a chair using your arms (e.g., wheelchair or bedside chair)?: A Lot Help needed to walk in hospital room?: A  Lot Help needed climbing 3-5 steps with a railing? : Total 6 Click Score: 13    End of Session Equipment Utilized During Treatment: Gait belt Activity Tolerance: Patient tolerated treatment well Patient left: in chair;with call bell/phone within reach;with chair alarm set;with family/visitor present   PT Visit Diagnosis: History of falling (Z91.81);Muscle weakness (generalized) (M62.81);Unsteadiness on feet (R26.81);Difficulty in walking, not elsewhere classified (R26.2)     Time: 8916-9450 PT Time Calculation (min) (ACUTE ONLY): 34 min  Charges:  $Gait Training: 23-37 mins                        Doreatha Massed, PT Acute Rehabilitation  Office: [phone] Pager: [phone]

## 2020-06-11 NOTE — NC FL2 (Signed)
[place] LEVEL OF CARE SCREENING TOOL     IDENTIFICATION  Patient Name: Adrian Singh Birthdate: 06-Jun-1934 Sex: male Admission Date (Current Location): 06/09/2020  [facility] and [place] Number:  Herbalist and Address:  [facility],  [place], [place]      Provider Number: [id number]  Attending Physician Name and Address:  Georgette Shell, MD  Relative Name and Phone Number:       Current Level of Care: Hospital Recommended Level of Care: [place] Prior Approval Number:    Date Approved/Denied:   PASRR Number: [id number]  Discharge Plan: SNF    Current Diagnoses: Patient Active Problem List   Diagnosis Date Noted  . Postural dizziness with presyncope 06/09/2020  . Protein-calorie malnutrition, severe 05/06/2020  . Palliative care by specialist   . Goals of care, counseling/discussion   . DNR (do not resuscitate) discussion   . FTT (failure to thrive) in adult   . Need for emotional support   . Pressure injury of skin 05/04/2020  . Stroke ([place]) 05/02/2020  . Generalized weakness 04/30/2020  . Fall at home, initial encounter 04/30/2020  . Acute on chronic anemia 04/30/2020  . Elevated CK 04/30/2020  . Chronic kidney disease, stage 3a 04/30/2020  . Elevated troponin level not due myocardial infarction 04/30/2020  . Fatigue 04/29/2020  . Malnutrition of mild degree ([place]) 04/06/2019  . Diabetic foot ulcer ([place]) 04/06/2019  . Mood disorder ([place]) 04/06/2019  . Aortic atherosclerosis ([place]) 02/13/2017  . Prostate cancer ([place]) 02/13/2017  . Primary malignant neoplasm of prostate metastatic to bone ([place]) 02/13/2017  . Orthostatic hypotension 03/19/2016  . CAD S/P percutaneous coronary angioplasty 02/28/2016  . Atherosclerosis of native coronary artery of native heart with angina pectoris ([place])   . Abnormal nuclear stress test 09/22/2015  . Thrombocytopenia ([place])   . Advanced directives,  counseling/discussion 09/27/2014  . PAF (paroxysmal atrial fibrillation) ([place]) 04/12/2014  . History of CVA (cerebrovascular accident) 03/31/2014  . CKD stage 3 due to type 2 diabetes mellitus ([place])   . Uncontrolled type 2 diabetes mellitus with diabetic polyneuropathy, without long-term current use of insulin ([place]) 11/05/2011  . Routine general medical examination at a health care facility 05/07/2011  . Essential hypertension 12/23/2009  . GERD 12/23/2009  . BPH (benign prostatic hyperplasia) 12/23/2009  . OSTEOARTHRITIS 12/23/2009    Orientation RESPIRATION BLADDER Height & Weight     Self  Normal Incontinent Weight: 145 lb 11.6 oz (66.1 kg) Height:  5\' 10"  (177.8 cm)  BEHAVIORAL SYMPTOMS/MOOD NEUROLOGICAL BOWEL NUTRITION STATUS      Continent Diet (see dc summary)  AMBULATORY STATUS COMMUNICATION OF NEEDS Skin   Extensive Assist Verbally Normal                       Personal Care Assistance Level of Assistance  Bathing, Feeding, Dressing Bathing Assistance: Limited assistance Feeding assistance: Independent Dressing Assistance: Limited assistance     Functional Limitations Info  Sight, Hearing, Speech Sight Info: Adequate Hearing Info: Adequate Speech Info: Adequate    SPECIAL CARE FACTORS FREQUENCY  PT (By licensed PT), OT (By licensed OT)     PT Frequency: 5x week OT Frequency: 3x week            Contractures Contractures Info: Not present    Additional Factors Info  Code Status, Allergies Code Status Info: Full Allergies Info: Soma, Doxazosin  Current Medications (06/11/2020):  This is the current hospital active medication list Current Facility-Administered Medications  Medication Dose Route Frequency Provider Last Rate Last Admin  . acetaminophen (TYLENOL) tablet 650 mg  650 mg Oral Q6H PRN Shalhoub, Sherryll Burger, MD       Or  . acetaminophen (TYLENOL) suppository 650 mg  650 mg Rectal Q6H PRN Shalhoub, Sherryll Burger, MD      . atorvastatin  (LIPITOR) tablet 20 mg  20 mg Oral Daily Shalhoub, Sherryll Burger, MD   20 mg at 06/11/20 0758  . enzalutamide Gillermina Phy) tablet 80 mg  80 mg Oral Daily Shalhoub, Sherryll Burger, MD      . feeding supplement (ENSURE ENLIVE) (ENSURE ENLIVE) liquid 237 mL  237 mL Oral TID BM Georgette Shell, MD   237 mL at 06/10/20 2108  . hydrALAZINE (APRESOLINE) injection 10 mg  10 mg Intravenous Q6H PRN Shalhoub, Sherryll Burger, MD      . lactated ringers infusion   Intravenous Continuous Georgette Shell, MD 100 mL/hr at 06/11/20 0546 New Bag at 06/11/20 0546  . [START ON 06/12/2020] metoprolol succinate (TOPROL-XL) 24 hr tablet 50 mg  50 mg Oral Daily Georgette Shell, MD      . morphine 4 MG/ML injection 4 mg  4 mg Intravenous Q4H PRN Shalhoub, Sherryll Burger, MD      . multivitamin with minerals tablet 1 tablet  1 tablet Oral Daily Georgette Shell, MD   1 tablet at 06/11/20 0758  . nitroGLYCERIN (NITROSTAT) SL tablet 0.4 mg  0.4 mg Sublingual Q5 min PRN Shalhoub, Sherryll Burger, MD      . ondansetron [facility]) tablet 4 mg  4 mg Oral Q6H PRN Shalhoub, Sherryll Burger, MD       Or  . ondansetron (ZOFRAN) injection 4 mg  4 mg Intravenous Q6H PRN Shalhoub, Sherryll Burger, MD      . polyethylene glycol (MIRALAX / GLYCOLAX) packet 17 g  17 g Oral Daily PRN Vernelle Emerald, MD      . Derrill Memo ON 06/12/2020] ramipril (ALTACE) capsule 2.5 mg  2.5 mg Oral Daily Georgette Shell, MD      . rivaroxaban [name]) tablet 20 mg  20 mg Oral Q supper Vernelle Emerald, MD   20 mg at 06/10/20 1644  . traMADol (ULTRAM) tablet 50 mg  50 mg Oral Q8H PRN Shalhoub, Sherryll Burger, MD   50 mg at 06/11/20 4656     Discharge Medications: Please see discharge summary for a list of discharge medications.  Relevant Imaging Results:  Relevant Lab Results:   Additional Information SSN: [place], LCSW

## 2020-06-11 NOTE — TOC Initial Note (Signed)
Transition of Care [facility]) - Initial/Assessment Note    Patient Details  Name: Adrian Singh MRN: [id number] Date of Birth: 09-11-1934  Transition of Care [facility]) CM/SW Contact:    Shade Flood, LCSW Phone Number: 06/11/2020, 2:11 PM  Clinical Narrative:                  Pt admitted from home. He had just discharged from SNF rehab the day before. PT recommending SNF.  Spoke with pt's son to assess. Son states that pt and family agree with need for additional SNF rehab and they request referrals. Preference is for [facility]. Referrals started. Insurance notification/auth started. Pt not yet medically stable for dc.  TOC will follow.  Expected Discharge Plan: Skilled Nursing Facility Barriers to Discharge: Continued Medical Work up   Patient Goals and CMS Choice   CMS Medicare.gov Compare Post Acute Care list provided to:: Patient Represenative (must comment) Choice offered to / list presented to : Adult Children  Expected Discharge Plan and Services Expected Discharge Plan: [place] In-house Referral: Clinical Social Work   Post Acute Care Choice: [place] Living arrangements for the past 2 months: [place], [place]                                      Prior Living Arrangements/Services Living arrangements for the past 2 months: [place], [place] Lives with:: Spouse Patient language and need for interpreter reviewed:: Yes Do you feel safe going back to the place where you live?: Yes      Need for Family Participation in Patient Care: Yes (Comment) Care giver support system in place?: Yes (comment)   Criminal Activity/Legal Involvement Pertinent to Current Situation/Hospitalization: No - Comment as needed  Activities of Daily Living Home Assistive Devices/Equipment: Walker (specify type), Dentures (specify type), Wheelchair, CBG Meter (pt. states he uses a Rollator;  Upper and lower dentures) ADL Screening (condition at time of admission) Patient's cognitive ability adequate to safely complete daily activities?: Yes Is the patient deaf or have difficulty hearing?: Yes Does the patient have difficulty seeing, even when wearing glasses/contacts?: Yes Does the patient have difficulty concentrating, remembering, or making decisions?: No Patient able to express need for assistance with ADLs?: Yes Does the patient have difficulty dressing or bathing?: Yes Independently performs ADLs?: No Communication: Independent Dressing (OT): Needs assistance Is this a change from baseline?: Pre-admission baseline Grooming: Needs assistance Is this a change from baseline?: Pre-admission baseline Feeding: Independent Bathing: Needs assistance Is this a change from baseline?: Pre-admission baseline Toileting: Needs assistance Is this a change from baseline?: Pre-admission baseline In/Out Bed: Needs assistance Is this a change from baseline?: Pre-admission baseline Walks in Home: Needs assistance Is this a change from baseline?: Pre-admission baseline Does the patient have difficulty walking or climbing stairs?: Yes Weakness of Legs: Both Weakness of Arms/Hands: Both  Permission Sought/Granted Permission sought to share information with : Chartered certified accountant granted to share information with : Yes, Verbal Permission Granted     Permission granted to share info w AGENCY: local snfs        Emotional Assessment       Orientation: : Oriented to Self, Oriented to Place, Oriented to  Time, Oriented to Situation Alcohol / Substance Use: Not Applicable Psych Involvement: No (comment)  Admission diagnosis:  Postural dizziness with presyncope [R42, R55]  Orthostatic hypotension [I95.1] Patient Active Problem List   Diagnosis Date Noted  . Postural dizziness with presyncope 06/09/2020  . Protein-calorie malnutrition, severe 05/06/2020  .  Palliative care by specialist   . Goals of care, counseling/discussion   . DNR (do not resuscitate) discussion   . FTT (failure to thrive) in adult   . Need for emotional support   . Pressure injury of skin 05/04/2020  . Stroke ([place]) 05/02/2020  . Generalized weakness 04/30/2020  . Fall at home, initial encounter 04/30/2020  . Acute on chronic anemia 04/30/2020  . Elevated CK 04/30/2020  . Chronic kidney disease, stage 3a 04/30/2020  . Elevated troponin level not due myocardial infarction 04/30/2020  . Fatigue 04/29/2020  . Malnutrition of mild degree ([place]) 04/06/2019  . Diabetic foot ulcer ([place]) 04/06/2019  . Mood disorder ([place]) 04/06/2019  . Aortic atherosclerosis ([place]) 02/13/2017  . Prostate cancer ([place]) 02/13/2017  . Primary malignant neoplasm of prostate metastatic to bone ([place]) 02/13/2017  . Orthostatic hypotension 03/19/2016  . CAD S/P percutaneous coronary angioplasty 02/28/2016  . Atherosclerosis of native coronary artery of native heart with angina pectoris ([place])   . Abnormal nuclear stress test 09/22/2015  . Thrombocytopenia ([place])   . Advanced directives, counseling/discussion 09/27/2014  . PAF (paroxysmal atrial fibrillation) ([place]) 04/12/2014  . History of CVA (cerebrovascular accident) 03/31/2014  . CKD stage 3 due to type 2 diabetes mellitus ([place])   . Uncontrolled type 2 diabetes mellitus with diabetic polyneuropathy, without long-term current use of insulin ([place]) 11/05/2011  . Routine general medical examination at a health care facility 05/07/2011  . Essential hypertension 12/23/2009  . GERD 12/23/2009  . BPH (benign prostatic hyperplasia) 12/23/2009  . OSTEOARTHRITIS 12/23/2009   PCP:  [name], MD Pharmacy:   [place], [place] [address] [place] [address] Phone: [phone] Fax: [phone]  [place] [address], [place] [address] Phone:  [phone] Fax: [phone]  [facility] [phone], [place] AT [facility] [place] [address] [place] [address] Phone: [phone] Fax: [phone]  [place], [place] - [place] [address] Phone: [phone] Fax: [phone]  [place], [name] [address] [place] [address] [place] [address] Phone: [phone] Fax: [phone]     Social Determinants of Health (SDOH) Interventions    Readmission Risk Interventions Readmission Risk Prevention Plan 06/11/2020  Transportation Screening Complete  Medication Review (RN Care Manager) Complete  SW Recovery Care/Counseling Consult Complete  Palliative Care Screening Not Applicable  Skilled Nursing Facility Complete  Some recent data might be hidden

## 2020-06-12 LAB — BASIC METABOLIC PANEL
Anion gap: 9 (ref 5–15)
BUN: 9 mg/dL (ref 8–23)
CO2: 27 mmol/L (ref 22–32)
Calcium: 7.6 mg/dL — ABNORMAL LOW (ref 8.9–10.3)
Chloride: 102 mmol/L (ref 98–111)
Creatinine, Ser: 0.62 mg/dL (ref 0.61–1.24)
GFR calc Af Amer: >60 mL/min (ref >60)
GFR calc non Af Amer: >60 mL/min (ref >60)
Glucose, Bld: 139 mg/dL — ABNORMAL HIGH (ref 70–99)
Potassium: 3.4 mmol/L — ABNORMAL LOW (ref 3.5–5.1)
Sodium: 138 mmol/L (ref 135–145)

## 2020-06-12 LAB — MAGNESIUM: Magnesium: 1.5 mg/dL — ABNORMAL LOW (ref 1.7–2.4)

## 2020-06-12 MED ORDER — POTASSIUM CHLORIDE CRYS ER 20 MEQ PO TBCR
40.0000 meq | EXTENDED_RELEASE_TABLET | Freq: Once | ORAL | Status: AC
  Administered 2020-06-12: 40 meq via ORAL
  Filled 2020-06-12: qty 2

## 2020-06-12 MED ORDER — MAGNESIUM SULFATE 2 GM/50ML IV SOLN
2.0000 g | Freq: Once | INTRAVENOUS | Status: AC
  Administered 2020-06-12: 2 g via INTRAVENOUS
  Filled 2020-06-12: qty 50

## 2020-06-12 NOTE — Progress Notes (Signed)
Physical Therapy Treatment Patient Details Name: Adrian Singh MRN: [id number] DOB: 01/01/1934 Today's Date: 06/12/2020    History of Present Illness 86 year old male with past medical history of coronary artery disease, paroxysmal atrial fibrillation on Xarelto, noninsulin-dependent diabetes mellitus type 2, recent acute stroke 04/2020, hypertension, chronic kidney disease stage IIIa and metastatic prostate cancer to bone who presents to [facility] emergency department with complaints of severe lightheadedness and weakness.  Pt admitted for orthostatic hypotension with hypertension    PT Comments    Pt is slowly progressing with mobility. BP continues to drop with standing/ambulation-79/51 after 2nd short walk on today. Pt c/o mild lightheadedness on today. Will continue to follow and progress activity as tolerated. Pt will need SNF for continued rehab.     Follow Up Recommendations  SNF     Equipment Recommendations  None recommended by PT    Recommendations for Other Services       Precautions / Restrictions Precautions Precautions: Fall Precaution Comments: orthostatic hypotension Restrictions Weight Bearing Restrictions: No    Mobility  Bed Mobility Overal bed mobility: Needs Assistance Bed Mobility: Supine to Sit     Supine to sit: Mod assist;HOB elevated     General bed mobility comments: Assist for trunk. Increased time. Pt denied dizziness. Sat EOB for several minutes with pt tapping toes.  Transfers Overall transfer level: Needs assistance Equipment used: Rolling walker (2 wheeled) Transfers: Sit to/from Stand Sit to Stand: Mod assist;+2 physical assistance;+2 safety/equipment;From elevated surface         General transfer comment: x2. Assist to power up, stabilize, control descent. Increased assistance to rise from low recliner. Cues for safety, technqiue, hand/feet placement.  Ambulation/Gait Ambulation/Gait assistance: Min assist;+2  safety/equipment Gait Distance (Feet): 10 Feet (x2) Assistive device: Rolling walker (2 wheeled) Gait Pattern/deviations: Step-to pattern     General Gait Details: Assist to steady pt throughout distance. Cues for safety, technique, posture, sequence. Pt tends to maintain a flexed trunk and knee posture once ambulating. Followed closely with recliner. BP 79/51 after 2nd walk. He denied lightheadedness/dizziness.   Stairs             Wheelchair Mobility    Modified Rankin (Stroke Patients Only)       Balance Overall balance assessment: Needs assistance;History of Falls         Standing balance support: Bilateral upper extremity supported Standing balance-Leahy Scale: Poor                              Cognition Arousal/Alertness: Awake/alert Behavior During Therapy: WFL for tasks assessed/performed Overall Cognitive Status: Within Functional Limits for tasks assessed                                 General Comments: very pleasant and cooperative      Exercises      General Comments        Pertinent Vitals/Pain Pain Assessment: No/denies pain    Home Living                      Prior Function            PT Goals (current goals can now be found in the care plan section) Progress towards PT goals: Progressing toward goals    Frequency    Min 2X/week      PT  Plan Current plan remains appropriate    Co-evaluation              AM-PAC PT "6 Clicks" Mobility   Outcome Measure  Help needed turning from your back to your side while in a flat bed without using bedrails?: A Little Help needed moving from lying on your back to sitting on the side of a flat bed without using bedrails?: A Lot Help needed moving to and from a bed to a chair (including a wheelchair)?: A Lot Help needed standing up from a chair using your arms (e.g., wheelchair or bedside chair)?: A Lot Help needed to walk in hospital room?: A  Lot Help needed climbing 3-5 steps with a railing? : Total 6 Click Score: 12    End of Session Equipment Utilized During Treatment: Gait belt Activity Tolerance: Patient tolerated treatment well Patient left: in chair;with call bell/phone within reach;with chair alarm set   PT Visit Diagnosis: History of falling (Z91.81);Muscle weakness (generalized) (M62.81);Unsteadiness on feet (R26.81);Difficulty in walking, not elsewhere classified (R26.2)     Time: 7591-6384 PT Time Calculation (min) (ACUTE ONLY): 24 min  Charges:  $Gait Training: 23-37 mins                         Doreatha Massed, PT Acute Rehabilitation  Office: [phone] Pager: [phone]

## 2020-06-12 NOTE — Progress Notes (Signed)
PROGRESS NOTE    Adrian Singh  KGY:[id number] DOB: 08-07-1934 DOA: 06/09/2020 PCP: [name], MD    Brief Narrative: 86 year old male with past medical history of coronary artery disease, paroxysmal atrial fibrillation on Xarelto, noninsulin-dependent diabetes mellitus type 2, recent acute stroke 04/2020, hypertension, chronic kidney disease stage IIIa and metastatic prostate cancer to bone who presents to [facility] emergency department with complaints of severe lightheadedness and weakness.  Since the patient's recent discharge from [facility] in August, patient has been undergoing rehabilitation in a local skilled nursing facility.  Patient explains that over the past several weeks patient has participated with physical therapy has been experiencing episodes of intense lightheadedness.  Patient describes these episodes of lightheadedness as "feeling like I am going to pass out."  He states that typically these episodes occur with attempting to rise from a seated position and are associated with generalized weakness.  Upon further questioning patient also complains of some associated palpitations as well as occasional vague chest discomfort.  Chest discomfort is brief, mild and midsternal in location.  Denies recently initiated medications.  Patient denies sick contacts, recent travel or confirmed contact with known COVID-19 infection.  Patient was discharged home from his skilled nursing facility the morning of 9/23.  Upon arriving home, the daughter noticed that the patient nearly passed out after getting out of the car to walk to the house.  This prompted the patient being brought to [facility] emergency department for evaluation.  Upon evaluation in emergency department, serial orthostatic vital signs were positive.  Patient was hydrated with 1 L of normal saline with minimal improvement of symptoms.  Due to patient's complicated medical history and  ongoing symptoms the emergency department provider has called the hospitalist group to evaluate the patient for admission to the hospital.   Assessment & Plan:   Principal Problem:   Postural dizziness with presyncope Active Problems:   Essential hypertension   Uncontrolled type 2 diabetes mellitus with diabetic polyneuropathy, without long-term current use of insulin (HCC)   PAF (paroxysmal atrial fibrillation) (HCC)   Orthostatic hypotension   Primary malignant neoplasm of prostate metastatic to bone (HCC)   Chronic kidney disease, stage 3a   Protein-calorie malnutrition, severe  #1 orthostatic hypotension with hypertension patient admitted with presyncope and is clearly orthostatic during the hospital stay. This could be related to decreased oral intake, use of Lasix and Cardura and autonomic neuropathy from longstanding diabetes. Patient continues to be dizzy upon standing.  Patient continues to be orthostatic.  Still on normal saline at 100 cc/h.  Monitor him closely for any fluid overload.  Lasix on hold.  She was seen by physical therapy recommending skilled nursing facility on discharge. I will hold the Flomax and continue Cardura PT consult REC SNF Review of his outside records show that patient has been deteriorating for over a month or 2 to with decreased appetite weight loss.  This was thought to be may be secondary to [name] that he is taking at home for prostate cancer.  #2 HTN-pressure remains soft 129/65.  Metoprolol dose was decreased yesterday his dose was decreased yesterday continue to decrease dose.    #3type 2 dm-on Metformin and glyburide prior to admission continue SSI CBG (last 3)  Recent Labs    06/10/20 1215 06/10/20 1645 06/10/20 2001  GLUCAP 148* 84 179*      #4  Chronic PAF on xarelto Rate controlled on metoprolol  #5 CKD stage IIIa stable  monitor   #6 history of prostate malignancy on Xtandi  #7 CVA August 2021 continue Xarelto and  Lipitor  #8 stage I pressure ulcer on coccyx present on admission  #9 acquired thrombophilia with chronic A. fib high risk for stroke on Xarelto  #10 patient had 6 beats of V. tach by telemetry monitor.  He was asymptomatic and was sleeping during these episodes.  Magnesium is 1.5 potassium is 3.4 replete both and recheck labs in a.m.  Continue metoprolol.   Pressure Injury 05/03/20 Coccyx Stage 1 -  Intact skin with non-blanchable redness of a localized area usually over a bony prominence. pink (Active)  05/03/20 0908  Location: Coccyx  Location Orientation:   Staging: Stage 1 -  Intact skin with non-blanchable redness of a localized area usually over a bony prominence.  Wound Description (Comments): pink  Present on Admission:    Estimated body mass index is 20.91 kg/m as calculated from the following:   Height as of this encounter: 5\' 10"  (1.778 m).   Weight as of this encounter: 66.1 kg.  DVT prophylaxis: xarelto Code Status: full Family Communication: none at bedside Disposition Plan:  Status UJ:[id number]  Dispo: The patient is from: SNF              Anticipated d/c is to: SNF              Anticipated d/c date is: 2 days              Patient currently is not medically stable to d/c.  Patient is extremely orthostatic and is on IV fluids    Consultants:   none  Procedures:none Antimicrobials:none  Subjective: Patient resting in bed sat up for 2 hours yesterday feeling better continues with dizziness still Family looking for skilled nursing facility. Objective: Vitals:   06/11/20 2037 06/11/20 2233 06/12/20 0123 06/12/20 0453  BP: (!) 170/82 (!) 195/83 (!) 147/61 129/65  Pulse: 76 77 77 73  Resp: 18   20  Temp: (!) 97.5 F (36.4 C)   98.2 F (36.8 C)  TempSrc: Oral   Oral  SpO2: 97%  96% 99%  Weight:      Height:        Intake/Output Summary (Last 24 hours) at 06/12/2020 1045 Last data filed at 06/12/2020 0600 Gross per 24 hour  Intake 2875.63 ml  Output  1800 ml  Net 1075.63 ml   Filed Weights   06/09/20 1456 06/10/20 0600  Weight: 71.7 kg 66.1 kg    Examination:  General exam: Appears calm and comfortable  Respiratory system: Clear to auscultation. Respiratory effort normal. Cardiovascular system: S1 & S2 heard, RRR. No JVD, murmurs, rubs, gallops or clicks. No pedal edema. Gastrointestinal system: Abdomen is nondistended, soft and nontender. No organomegaly or masses felt. Normal bowel sounds heard. Central nervous system: Alert and oriented. No focal neurological deficits. Extremities: Symmetric 5 x 5 power. Skin: No rashes, lesions or ulcers Psychiatry: Judgement and insight appear normal. Mood & affect appropriate.     Data Reviewed: I have personally reviewed following labs and imaging studies  CBC: Recent Labs  Lab 06/09/20 1521 06/10/20 0049  WBC 6.4 5.9  NEUTROABS 4.6 4.0  HGB 10.0* 9.3*  HCT 33.2* 31.2*  MCV 79.2* 81.0  PLT 222 956   Basic Metabolic Panel: Recent Labs  Lab 06/09/20 1521 06/10/20 0049 06/12/20 0724  NA 142 140 138  K 3.4* 3.2* 3.4*  CL 99 102 102  CO2 28 27 27  GLUCOSE 118* 95 139*  BUN 18 16 9   CREATININE 1.00 0.86 0.62  CALCIUM 8.0* 7.0* 7.6*  MG  --  1.6* 1.5*   GFR: Estimated Creatinine Clearance: 65.4 mL/min (by C-G formula based on SCr of 0.62 mg/dL). Liver Function Tests: Recent Labs  Lab 06/09/20 1521 06/10/20 0049  AST 35 30  ALT 27 21  ALKPHOS 317* 299*  BILITOT 0.5 0.4  PROT 7.7 6.7  ALBUMIN 2.3* 2.2*   No results for input(s): LIPASE, AMYLASE in the last 168 hours. No results for input(s): AMMONIA in the last 168 hours. Coagulation Profile: Recent Labs  Lab 06/09/20 1521  INR 1.5*   Cardiac Enzymes: No results for input(s): CKTOTAL, CKMB, CKMBINDEX, TROPONINI in the last 168 hours. BNP (last 3 results) No results for input(s): PROBNP in the last 8760 hours. HbA1C: No results for input(s): HGBA1C in the last 72 hours. CBG: Recent Labs  Lab  06/10/20 0759 06/10/20 0829 06/10/20 1215 06/10/20 1645 06/10/20 2001  GLUCAP 56* 90 148* 84 179*   Lipid Profile: No results for input(s): CHOL, HDL, LDLCALC, TRIG, CHOLHDL, LDLDIRECT in the last 72 hours. Thyroid Function Tests: No results for input(s): TSH, T4TOTAL, FREET4, T3FREE, THYROIDAB in the last 72 hours. Anemia Panel: No results for input(s): VITAMINB12, FOLATE, FERRITIN, TIBC, IRON, RETICCTPCT in the last 72 hours. Sepsis Labs: Recent Labs  Lab 06/09/20 1521 06/09/20 1900  LATICACIDVEN 2.1* 1.5    Recent Results (from the past 240 hour(s))  Culture, blood (Routine x 2)     Status: None (Preliminary result)   Collection Time: 06/09/20  3:25 PM   Specimen: BLOOD  Result Value Ref Range Status   Specimen Description   Final    BLOOD RIGHT ANTECUBITAL Performed at [facility] [address] [place], [place] [address]    Special Requests   Final    BOTTLES DRAWN AEROBIC AND ANAEROBIC Blood Culture adequate volume Performed at [facility] [address], [place], [place] [address]    Culture   Final    NO GROWTH 3 DAYS Performed at [facility], [place] [address], [place], [place] [address]    Report Status PENDING  Incomplete  Respiratory Panel by RT PCR (Flu A&B, Covid) - Nasopharyngeal Swab     Status: None   Collection Time: 06/09/20  3:40 PM   Specimen: Nasopharyngeal Swab  Result Value Ref Range Status   SARS Coronavirus 2 by RT PCR NEGATIVE NEGATIVE Final    Comment: (NOTE) SARS-CoV-2 target nucleic acids are NOT DETECTED.  The SARS-CoV-2 RNA is generally detectable in upper respiratoy specimens during the acute phase of infection. The lowest concentration of SARS-CoV-2 viral copies this assay can detect is 131 copies/mL. A negative result does not preclude SARS-Cov-2 infection and should not be used as the sole basis for treatment or other patient management decisions. A negative result may occur with   improper specimen collection/handling, submission of specimen other than nasopharyngeal swab, presence of viral mutation(s) within the areas targeted by this assay, and inadequate number of viral copies (<131 copies/mL). A negative result must be combined with clinical observations, patient history, and epidemiological information. The expected result is Negative.  Fact Sheet for Patients:  PinkCheek.be  Fact Sheet for Healthcare Providers:  GravelBags.it  This test is no t yet approved or cleared by the [place] FDA and  has been authorized for detection and/or diagnosis of SARS-CoV-2 by FDA under an Emergency Use Authorization (EUA).  This EUA will remain  in effect (meaning this test can be used) for the duration of the COVID-19 declaration under Section 564(b)(1) of the Act, 21 U.S.C. section 360bbb-3(b)(1), unless the authorization is terminated or revoked sooner.     Influenza A by PCR NEGATIVE NEGATIVE Final   Influenza B by PCR NEGATIVE NEGATIVE Final    Comment: (NOTE) The Xpert Xpress SARS-CoV-2/FLU/RSV assay is intended as an aid in  the diagnosis of influenza from Nasopharyngeal swab specimens and  should not be used as a sole basis for treatment. Nasal washings and  aspirates are unacceptable for Xpert Xpress SARS-CoV-2/FLU/RSV  testing.  Fact Sheet for Patients: PinkCheek.be  Fact Sheet for Healthcare Providers: GravelBags.it  This test is not yet approved or cleared by the [place] FDA and  has been authorized for detection and/or diagnosis of SARS-CoV-2 by  FDA under an Emergency Use Authorization (EUA). This EUA will remain  in effect (meaning this test can be used) for the duration of the  Covid-19 declaration under Section 564(b)(1) of the Act, 21  U.S.C. section 360bbb-3(b)(1), unless the authorization is  terminated or  revoked. Performed at [facility], [place] [address], [place], [place] [address]   MRSA PCR Screening     Status: None   Collection Time: 06/10/20 12:02 AM   Specimen: Nasal Mucosa; Nasopharyngeal  Result Value Ref Range Status   MRSA by PCR NEGATIVE NEGATIVE Final    Comment:        The GeneXpert MRSA Assay (FDA approved for NASAL specimens only), is one component of a comprehensive MRSA colonization surveillance program. It is not intended to diagnose MRSA infection nor to guide or monitor treatment for MRSA infections. Performed at [facility], [place] [address], [place], [place] [address]   Culture, blood (Routine x 2)     Status: None (Preliminary result)   Collection Time: 06/10/20 10:12 AM   Specimen: BLOOD RIGHT HAND  Result Value Ref Range Status   Specimen Description   Final    BLOOD RIGHT HAND Performed at [facility] [address], [place], [place] [address]    Special Requests   Final    BOTTLES DRAWN AEROBIC AND ANAEROBIC Blood Culture adequate volume Performed at [facility] [address], [place], [place] [address]    Culture   Final    NO GROWTH 2 DAYS Performed at [facility] [address] [place], [place] [address]    Report Status PENDING  Incomplete         Radiology Studies: No results found.      Scheduled Meds: . atorvastatin  20 mg Oral Daily  . enzalutamide  80 mg Oral Daily  . feeding supplement (ENSURE ENLIVE)  237 mL Oral TID BM  . metoprolol succinate  50 mg Oral Daily  . multivitamin with minerals  1 tablet Oral Daily  . ramipril  2.5 mg Oral Daily  . rivaroxaban  20 mg Oral Q supper   Continuous Infusions: . lactated ringers 100 mL/hr at 06/12/20 0132     LOS: 2 days     Georgette Shell, MD 06/12/2020, 10:45 AM

## 2020-06-13 DIAGNOSIS — R1312 Dysphagia, oropharyngeal phase: Secondary | ICD-10-CM | POA: Diagnosis not present

## 2020-06-13 DIAGNOSIS — Z6820 Body mass index (BMI) 20.0-20.9, adult: Secondary | ICD-10-CM | POA: Diagnosis not present

## 2020-06-13 DIAGNOSIS — E440 Moderate protein-calorie malnutrition: Secondary | ICD-10-CM | POA: Diagnosis not present

## 2020-06-13 DIAGNOSIS — R531 Weakness: Secondary | ICD-10-CM | POA: Diagnosis not present

## 2020-06-13 DIAGNOSIS — E1142 Type 2 diabetes mellitus with diabetic polyneuropathy: Secondary | ICD-10-CM | POA: Diagnosis not present

## 2020-06-13 DIAGNOSIS — C7951 Secondary malignant neoplasm of bone: Secondary | ICD-10-CM | POA: Diagnosis not present

## 2020-06-13 DIAGNOSIS — Z87442 Personal history of urinary calculi: Secondary | ICD-10-CM | POA: Diagnosis not present

## 2020-06-13 DIAGNOSIS — I69322 Dysarthria following cerebral infarction: Secondary | ICD-10-CM | POA: Diagnosis not present

## 2020-06-13 DIAGNOSIS — I10 Essential (primary) hypertension: Secondary | ICD-10-CM | POA: Diagnosis not present

## 2020-06-13 DIAGNOSIS — Z888 Allergy status to other drugs, medicaments and biological substances status: Secondary | ICD-10-CM | POA: Diagnosis not present

## 2020-06-13 DIAGNOSIS — R55 Syncope and collapse: Secondary | ICD-10-CM | POA: Diagnosis not present

## 2020-06-13 DIAGNOSIS — I951 Orthostatic hypotension: Secondary | ICD-10-CM | POA: Diagnosis not present

## 2020-06-13 DIAGNOSIS — G319 Degenerative disease of nervous system, unspecified: Secondary | ICD-10-CM | POA: Diagnosis not present

## 2020-06-13 DIAGNOSIS — Z85828 Personal history of other malignant neoplasm of skin: Secondary | ICD-10-CM | POA: Diagnosis not present

## 2020-06-13 DIAGNOSIS — C61 Malignant neoplasm of prostate: Secondary | ICD-10-CM | POA: Diagnosis not present

## 2020-06-13 DIAGNOSIS — D649 Anemia, unspecified: Secondary | ICD-10-CM | POA: Diagnosis not present

## 2020-06-13 DIAGNOSIS — E1165 Type 2 diabetes mellitus with hyperglycemia: Secondary | ICD-10-CM | POA: Diagnosis present

## 2020-06-13 DIAGNOSIS — I480 Paroxysmal atrial fibrillation: Secondary | ICD-10-CM | POA: Diagnosis not present

## 2020-06-13 DIAGNOSIS — E559 Vitamin D deficiency, unspecified: Secondary | ICD-10-CM | POA: Diagnosis not present

## 2020-06-13 DIAGNOSIS — E43 Unspecified severe protein-calorie malnutrition: Secondary | ICD-10-CM | POA: Diagnosis not present

## 2020-06-13 DIAGNOSIS — N1831 Chronic kidney disease, stage 3a: Secondary | ICD-10-CM | POA: Diagnosis not present

## 2020-06-13 DIAGNOSIS — Z20822 Contact with and (suspected) exposure to covid-19: Secondary | ICD-10-CM | POA: Diagnosis not present

## 2020-06-13 DIAGNOSIS — I25119 Atherosclerotic heart disease of native coronary artery with unspecified angina pectoris: Secondary | ICD-10-CM | POA: Diagnosis not present

## 2020-06-13 DIAGNOSIS — I130 Hypertensive heart and chronic kidney disease with heart failure and stage 1 through stage 4 chronic kidney disease, or unspecified chronic kidney disease: Secondary | ICD-10-CM | POA: Diagnosis not present

## 2020-06-13 DIAGNOSIS — E1122 Type 2 diabetes mellitus with diabetic chronic kidney disease: Secondary | ICD-10-CM | POA: Diagnosis not present

## 2020-06-13 DIAGNOSIS — I4891 Unspecified atrial fibrillation: Secondary | ICD-10-CM | POA: Diagnosis not present

## 2020-06-13 DIAGNOSIS — R000 Tachycardia, unspecified: Secondary | ICD-10-CM | POA: Diagnosis not present

## 2020-06-13 DIAGNOSIS — M6281 Muscle weakness (generalized): Secondary | ICD-10-CM | POA: Diagnosis not present

## 2020-06-13 DIAGNOSIS — E876 Hypokalemia: Secondary | ICD-10-CM | POA: Diagnosis not present

## 2020-06-13 DIAGNOSIS — I2510 Atherosclerotic heart disease of native coronary artery without angina pectoris: Secondary | ICD-10-CM | POA: Diagnosis not present

## 2020-06-13 DIAGNOSIS — Z8673 Personal history of transient ischemic attack (TIA), and cerebral infarction without residual deficits: Secondary | ICD-10-CM | POA: Diagnosis not present

## 2020-06-13 DIAGNOSIS — Z823 Family history of stroke: Secondary | ICD-10-CM | POA: Diagnosis not present

## 2020-06-13 DIAGNOSIS — R197 Diarrhea, unspecified: Secondary | ICD-10-CM | POA: Diagnosis present

## 2020-06-13 DIAGNOSIS — R42 Dizziness and giddiness: Secondary | ICD-10-CM | POA: Diagnosis not present

## 2020-06-13 DIAGNOSIS — Z955 Presence of coronary angioplasty implant and graft: Secondary | ICD-10-CM | POA: Diagnosis not present

## 2020-06-13 DIAGNOSIS — I5032 Chronic diastolic (congestive) heart failure: Secondary | ICD-10-CM | POA: Diagnosis not present

## 2020-06-13 DIAGNOSIS — I959 Hypotension, unspecified: Secondary | ICD-10-CM | POA: Diagnosis not present

## 2020-06-13 DIAGNOSIS — R627 Adult failure to thrive: Secondary | ICD-10-CM | POA: Diagnosis not present

## 2020-06-13 DIAGNOSIS — I69354 Hemiplegia and hemiparesis following cerebral infarction affecting left non-dominant side: Secondary | ICD-10-CM | POA: Diagnosis not present

## 2020-06-13 DIAGNOSIS — Z7901 Long term (current) use of anticoagulants: Secondary | ICD-10-CM | POA: Diagnosis not present

## 2020-06-13 DIAGNOSIS — R5381 Other malaise: Secondary | ICD-10-CM | POA: Diagnosis not present

## 2020-06-13 DIAGNOSIS — Z23 Encounter for immunization: Secondary | ICD-10-CM | POA: Diagnosis not present

## 2020-06-13 LAB — GLUCOSE, CAPILLARY: Glucose-Capillary: 201 mg/dL — ABNORMAL HIGH (ref 70–99)

## 2020-06-13 LAB — RESPIRATORY PANEL BY RT PCR (FLU A&B, COVID)
Influenza A by PCR: NEGATIVE
Influenza B by PCR: NEGATIVE
SARS Coronavirus 2 by RT PCR: NEGATIVE

## 2020-06-13 MED ORDER — TRAMADOL HCL 50 MG PO TABS
50.0000 mg | ORAL_TABLET | Freq: Three times a day (TID) | ORAL | 0 refills | Status: DC | PRN
Start: 2020-06-13 — End: 2020-07-27

## 2020-06-13 MED ORDER — POTASSIUM CHLORIDE CRYS ER 20 MEQ PO TBCR
40.0000 meq | EXTENDED_RELEASE_TABLET | Freq: Once | ORAL | Status: AC
  Administered 2020-06-13: 40 meq via ORAL
  Filled 2020-06-13: qty 2

## 2020-06-13 MED ORDER — METOPROLOL SUCCINATE ER 50 MG PO TB24
50.0000 mg | ORAL_TABLET | Freq: Every day | ORAL | 4 refills | Status: DC
Start: 2020-06-14 — End: 2020-06-24

## 2020-06-13 MED ORDER — GLIMEPIRIDE 2 MG PO TABS: 2.0000 mg | ORAL_TABLET | ORAL | 4 refills | Status: DC

## 2020-06-13 MED ORDER — MAGNESIUM SULFATE 2 GM/50ML IV SOLN
2.0000 g | Freq: Once | INTRAVENOUS | Status: AC
  Administered 2020-06-13: 2 g via INTRAVENOUS
  Filled 2020-06-13: qty 50

## 2020-06-13 MED ORDER — RAMIPRIL 2.5 MG PO CAPS
2.5000 mg | ORAL_CAPSULE | Freq: Every day | ORAL | 4 refills | Status: DC
Start: 2020-06-14 — End: 2020-06-24

## 2020-06-13 MED ORDER — INSULIN ASPART 100 UNIT/ML ~~LOC~~ SOLN
0.0000 [IU] | Freq: Three times a day (TID) | SUBCUTANEOUS | Status: DC
  Administered 2020-06-13: 2 [IU] via SUBCUTANEOUS

## 2020-06-13 NOTE — Progress Notes (Signed)
Report called to [place] at [employer]. Awaiting [facility] to transport.

## 2020-06-13 NOTE — Consult Note (Signed)
   [facility] Inpatient Consult   06/13/2020  Adrian Singh June 09, 1934 [id number]   Patient chart has been reviewed for readmissions less than 30 days and for high risk score for unplanned readmissions.  Patient assessed for community [facility] follow up needs.  Chart review reveals patient current recommendation is for SNF. No [facility] CM needs at this time.  Netta Cedars, MSN, [facility] Liaison Nurse Mobile Phone [phone]  Toll free office [phone]

## 2020-06-13 NOTE — Discharge Summary (Signed)
Physician Discharge Summary  WYLEY HACK JOA:[id number] DOB: 1934-04-24 DOA: 06/09/2020  PCP: [name], MD  Admit date: 06/09/2020 Discharge date: 06/13/2020  Admitted From: Nursing home Disposition: Nursing home Recommendations for Outpatient Follow-up:  1. Follow up with PCP in 1-2 weeks 2. Please obtain BMP/CBC in one week 3. Please have the nursing home doctor evaluate his medications on a regular basis my concerns are he is on Flomax and doxazosin and patient has severe orthostatic hypotension if this gets worse consider stopping Flomax. during the hospital stay his Flomax was not given.  He only received doxazosin and he was able to urinate without any problems. 4. He needs to get up out of bed very slowly otherwise he easily feels lightheaded. 5. Check BMP and mag level once a week since he tends to have low potassium and low magnesium and has had occasional beats of V. tach during this hospital stay.  Home Health: None Equipment/Devices: None Discharge Condition stable and improved CODE STATUS: Full code  diet recommendation: Cardiac diet Brief/Interim Summary:86 year old male with past medical history of coronary artery disease, paroxysmal atrial fibrillation on Xarelto, noninsulin-dependent diabetes mellitus type 2, recent acute stroke8/2021,hypertension, chronic kidney disease stage IIIa and metastatic prostate cancer to bone who presents to [facility] emergency department with complaints of severe lightheadedness and weakness.  Since the patient's recent discharge from [facility] in August, patient has been undergoing rehabilitation in a local skilled nursing facility. Patient explains that over the past several weeks patient has participated with physical therapy has been experiencing episodes of intense lightheadedness. Patient describes these episodes of lightheadedness as "feeling like I am going to pass out." He states that typically these  episodes occur with attempting to rise from a seated position and are associated with generalized weakness. Upon further questioning patient also complains of some associated palpitations as well as occasional vague chest discomfort.Chest discomfort is brief, mild and midsternal in location.  Denies recently initiated medications. Patient denies sick contacts, recent travel or confirmed contact with known COVID-19 infection.  Patient was discharged home from his skilled nursing facility the morning of 9/23. Upon arriving home, the daughter noticed that the patient nearly passed out after getting out of the car to walk to the house. This prompted the patient being brought to [facility] emergency department for evaluation.  Upon evaluation in emergency department, serial orthostatic vital signs were positive. Patient was hydrated with 1 L of normal saline with minimal improvement of symptoms. Due to patient's complicated medical history and ongoing symptoms the emergency department provider has called the hospitalist group to evaluate the patient for admission to the hospital.   Discharge Diagnoses:  Principal Problem:   Postural dizziness with presyncope Active Problems:   Essential hypertension   Uncontrolled type 2 diabetes mellitus with diabetic polyneuropathy, without long-term current use of insulin (HCC)   PAF (paroxysmal atrial fibrillation) (HCC)   Orthostatic hypotension   Primary malignant neoplasm of prostate metastatic to bone (HCC)   Chronic kidney disease, stage 3a   Protein-calorie malnutrition, severe  #1 orthostatic hypotension with hypertension patient admitted with presyncope and is clearly orthostatic during the hospital stay. This could be related to decreased oral intake, use of Lasix and Cardura and autonomic neuropathy from longstanding diabetes.  Patient will be discharged to skilled nursing facility today. He was treated with IV fluids.  #2  HTN-his blood pressure is 156/75 with a heart rate of 73.  Continue current dose of metoprolol  and ACE.  Unfortunately he will have to tolerate blood pressure on the higher side due to orthostatic hypotension.     #3type 2 dm-continue Metformin and glyburide.   #4  Chronic PAF on xarelto Rate controlled on metoprolol  #5 CKD stage IIIa stable monitor   #6 history of prostate malignancy on Xtandi  #7 CVA August 2021 continue Xarelto and Lipitor  #8 stage I pressure ulcer on coccyx present on admission  #9 acquired thrombophilia with chronic A. fib high risk for stroke on Xarelto  #10 patient had 6 beats of V. tach by telemetry monitor.  He was asymptomatic and was sleeping during these episodes.  Magnesium is 1.5 potassium is 3.4 repleted both and recheck labs in a.m.  Continue metoprolol.  Pressure Injury 05/03/20 Coccyx Stage 1 -  Intact skin with non-blanchable redness of a localized area usually over a bony prominence. pink (Active)  05/03/20 0908  Location: Coccyx  Location Orientation:   Staging: Stage 1 -  Intact skin with non-blanchable redness of a localized area usually over a bony prominence.  Wound Description (Comments): pink  Present on Admission:       Nutrition Problem: Inadequate oral intake Etiology: decreased appetite    Signs/Symptoms: other (comment) (Per MST report)     Interventions: Ensure Enlive (each supplement provides 350kcal and 20 grams of protein), MVI  Estimated body mass index is 20.91 kg/m as calculated from the following:   Height as of this encounter: '5\' 10"'  (1.778 m).   Weight as of this encounter: 66.1 kg.  Discharge Instructions   Allergies as of 06/13/2020      Reactions   Soma [carisoprodol] Other (See Comments)   "did a number on me"   Doxazosin Other (See Comments)   dizziness      Medication List    STOP taking these medications   metFORMIN 500 MG tablet Commonly known as: GLUCOPHAGE     TAKE these  medications   acetaminophen 650 MG CR tablet Commonly known as: TYLENOL Take 650 mg by mouth 2 (two) times daily as needed for pain.   atorvastatin 20 MG tablet Commonly known as: LIPITOR Take 20 mg by mouth daily. What changed: Another medication with the same name was removed. Continue taking this medication, and follow the directions you see here.   Bayer Microlet Lancets lancets Use to test blood sugar once daily dx: E11.40   Calcium 600 1500 (600 Ca) MG Tabs tablet Generic drug: calcium carbonate Take 600 mg of elemental calcium by mouth daily with breakfast.   CONTOUR NEXT EZ MONITOR w/Device Kit USE TO TEST BLOOD SUGAR ONCE DAILY. Dx Code E11.40   Contour Next Test test strip Generic drug: glucose blood Check blood sugar once daily and as directed.Dx Code E11.40   cyanocobalamin 1000 MCG tablet Take 1,000 mg by mouth at bedtime.   docusate sodium 250 MG capsule Commonly known as: COLACE Take 250 mg by mouth daily as needed for constipation.   doxazosin 1 MG tablet Commonly known as: CARDURA Take 1 tablet (1 mg total) by mouth at bedtime.   enzalutamide 80 MG tablet Commonly known as: XTANDI Take 80 mg by mouth daily.   feeding supplement (PRO-STAT SUGAR FREE 64) Liqd Take 30 mLs by mouth in the morning and at bedtime.   furosemide 40 MG tablet Commonly known as: LASIX Take 1 tablet (40 mg total) by mouth daily as needed. What changed: when to take this   glimepiride 2 MG tablet  Commonly known as: Amaryl Take 1 tablet (2 mg total) by mouth every morning. What changed:   medication strength  how much to take  when to take this   metoprolol succinate 50 MG 24 hr tablet Commonly known as: TOPROL-XL Take 1 tablet (50 mg total) by mouth daily. Take with or immediately following a meal. Start taking on: June 14, 2020 What changed:   medication strength  how much to take   multivitamin with minerals Tabs tablet Take 1 tablet by mouth daily.    nitroGLYCERIN 0.4 MG SL tablet Commonly known as: NITROSTAT Place 1 tablet (0.4 mg total) under the tongue every 5 (five) minutes as needed for chest pain. What changed: additional instructions   OVER THE COUNTER MEDICATION Take 15 mLs by mouth in the morning and at bedtime. Bio-Cell   ramipril 2.5 MG capsule Commonly known as: ALTACE Take 1 capsule (2.5 mg total) by mouth daily. Start taking on: June 14, 2020 What changed:   medication strength  how much to take   rivaroxaban 20 MG Tabs tablet Commonly known as: XARELTO Take 1 tablet (20 mg total) by mouth daily with supper.   tamsulosin 0.4 MG Caps capsule Commonly known as: FLOMAX Take 1 capsule (0.4 mg total) by mouth daily. What changed: when to take this   traMADol 50 MG tablet Commonly known as: ULTRAM Take 1 tablet (50 mg total) by mouth 3 (three) times daily as needed. What changed:   when to take this  reasons to take this   VITAMIN B COMPLEX PO Take 1 tablet by mouth daily.   vitamin C 1000 MG tablet Take 1,000 mg by mouth daily.   Vitamin D3 1.25 MG (50000 UT) Caps Take 50,000 Units by mouth once a week. Saturday       Contact information for after-discharge care    Destination    [facility] Preferred SNF .   Service: Skilled Nursing Contact information: [address] [address] [phone]                 Allergies  Allergen Reactions  . Soma [Carisoprodol] Other (See Comments)    "did a number on me"  . Doxazosin Other (See Comments)    dizziness    Consultations:  None   Procedures/Studies: DG Chest 2 View  Result Date: 06/09/2020 CLINICAL DATA:  Suspected sepsis, discharge from hospital but blood pressure suddenly draft and patient became dizzy EXAM: CHEST - 2 VIEW COMPARISON:  Chest x-ray 05/11/2020, CT chest 05/01/2020 FINDINGS: The heart size and mediastinal contours are within normal limits. Coronary artery stent in  grossly similar position. Lucency posterior to the left ventricle consistent with known hiatal hernia. No focal consolidation. No pulmonary edema. No pleural effusion. No pneumothorax. No acute osseous abnormality.  Degenerative changes of the spine. IMPRESSION: No active cardiopulmonary disease. Electronically Signed   By: Iven Finn M.D.   On: 06/09/2020 16:12   (Echo, Carotid, EGD, Colonoscopy, ERCP)    Subjective: Patient resting in bed awake and alert less lightheaded than yesterday.  Discharge Exam: Vitals:   06/13/20 0545 06/13/20 1131  BP: (!) 156/75 (!) 142/72  Pulse: 73 70  Resp: 16 18  Temp: 97.6 F (36.4 C) 98.1 F (36.7 C)  SpO2: 95% 95%   Vitals:   06/12/20 1842 06/12/20 2143 06/13/20 0545 06/13/20 1131  BP: 130/70 132/75 (!) 156/75 (!) 142/72  Pulse: 73 73 73 70  Resp: '20 18 16 ' 18  Temp: 98.3 F (36.8 C) 98.3 F (36.8 C) 97.6 F (36.4 C) 98.1 F (36.7 C)  TempSrc: Oral Oral Oral Oral  SpO2: 95% 95% 95% 95%  Weight:      Height:        General: Pt is alert, awake, not in acute distress Cardiovascular: RRR, S1/S2 +, no rubs, no gallops Respiratory: CTA bilaterally, no wheezing, no rhonchi Abdominal: Soft, NT, ND, bowel sounds + Extremities: no edema, no cyanosis    The results of significant diagnostics from this hospitalization (including imaging, microbiology, ancillary and laboratory) are listed below for reference.     Microbiology: Recent Results (from the past 240 hour(s))  Culture, blood (Routine x 2)     Status: None (Preliminary result)   Collection Time: 06/09/20  3:25 PM   Specimen: BLOOD  Result Value Ref Range Status   Specimen Description   Final    BLOOD RIGHT ANTECUBITAL Performed at [facility] [address] [place], [place] [address]    Special Requests   Final    BOTTLES DRAWN AEROBIC AND ANAEROBIC Blood Culture adequate volume Performed at [facility] [address],  [place], [place] [address]    Culture   Final    NO GROWTH 4 DAYS Performed at [facility], [place] [address], [place], [place] [address]    Report Status PENDING  Incomplete  Respiratory Panel by RT PCR (Flu A&B, Covid) - Nasopharyngeal Swab     Status: None   Collection Time: 06/09/20  3:40 PM   Specimen: Nasopharyngeal Swab  Result Value Ref Range Status   SARS Coronavirus 2 by RT PCR NEGATIVE NEGATIVE Final    Comment: (NOTE) SARS-CoV-2 target nucleic acids are NOT DETECTED.  The SARS-CoV-2 RNA is generally detectable in upper respiratoy specimens during the acute phase of infection. The lowest concentration of SARS-CoV-2 viral copies this assay can detect is 131 copies/mL. A negative result does not preclude SARS-Cov-2 infection and should not be used as the sole basis for treatment or other patient management decisions. A negative result may occur with  improper specimen collection/handling, submission of specimen other than nasopharyngeal swab, presence of viral mutation(s) within the areas targeted by this assay, and inadequate number of viral copies (<131 copies/mL). A negative result must be combined with clinical observations, patient history, and epidemiological information. The expected result is Negative.  Fact Sheet for Patients:  PinkCheek.be  Fact Sheet for Healthcare Providers:  GravelBags.it  This test is no t yet approved or cleared by the [place] FDA and  has been authorized for detection and/or diagnosis of SARS-CoV-2 by FDA under an Emergency Use Authorization (EUA). This EUA will remain  in effect (meaning this test can be used) for the duration of the COVID-19 declaration under Section 564(b)(1) of the Act, 21 U.S.C. section 360bbb-3(b)(1), unless the authorization is terminated or revoked sooner.     Influenza A by PCR NEGATIVE NEGATIVE Final   Influenza B by PCR NEGATIVE NEGATIVE Final     Comment: (NOTE) The Xpert Xpress SARS-CoV-2/FLU/RSV assay is intended as an aid in  the diagnosis of influenza from Nasopharyngeal swab specimens and  should not be used as a sole basis for treatment. Nasal washings and  aspirates are unacceptable for Xpert Xpress SARS-CoV-2/FLU/RSV  testing.  Fact Sheet for Patients: PinkCheek.be  Fact Sheet for Healthcare Providers: GravelBags.it  This test is not yet approved or cleared by the [place] FDA and  has been  authorized for detection and/or diagnosis of SARS-CoV-2 by  FDA under an Emergency Use Authorization (EUA). This EUA will remain  in effect (meaning this test can be used) for the duration of the  Covid-19 declaration under Section 564(b)(1) of the Act, 21  U.S.C. section 360bbb-3(b)(1), unless the authorization is  terminated or revoked. Performed at [facility], [place] [address] [place], [place] [address]   MRSA PCR Screening     Status: None   Collection Time: 06/10/20 12:02 AM   Specimen: Nasal Mucosa; Nasopharyngeal  Result Value Ref Range Status   MRSA by PCR NEGATIVE NEGATIVE Final    Comment:        The GeneXpert MRSA Assay (FDA approved for NASAL specimens only), is one component of a comprehensive MRSA colonization surveillance program. It is not intended to diagnose MRSA infection nor to guide or monitor treatment for MRSA infections. Performed at [facility], [place] [address] [place], [place] [address]   Culture, blood (Routine x 2)     Status: None (Preliminary result)   Collection Time: 06/10/20 10:12 AM   Specimen: BLOOD RIGHT HAND  Result Value Ref Range Status   Specimen Description   Final    BLOOD RIGHT HAND Performed at [facility] [address], [place], [place] [address]    Special Requests   Final    BOTTLES DRAWN AEROBIC AND ANAEROBIC Blood Culture adequate  volume Performed at [facility] [address], [place], [place] [address]    Culture   Final    NO GROWTH 3 DAYS Performed at [facility], [place] [address] [place], [place] [address]    Report Status PENDING  Incomplete     Labs: BNP (last 3 results) No results for input(s): BNP in the last 8760 hours. Basic Metabolic Panel: Recent Labs  Lab 06/09/20 1521 06/10/20 0049 06/12/20 0724  NA 142 140 138  K 3.4* 3.2* 3.4*  CL 99 102 102  CO2 '28 27 27  ' GLUCOSE 118* 95 139*  BUN '18 16 9  ' CREATININE 1.00 0.86 0.62  CALCIUM 8.0* 7.0* 7.6*  MG  --  1.6* 1.5*   Liver Function Tests: Recent Labs  Lab 06/09/20 1521 06/10/20 0049  AST 35 30  ALT 27 21  ALKPHOS 317* 299*  BILITOT 0.5 0.4  PROT 7.7 6.7  ALBUMIN 2.3* 2.2*   No results for input(s): LIPASE, AMYLASE in the last 168 hours. No results for input(s): AMMONIA in the last 168 hours. CBC: Recent Labs  Lab 06/09/20 1521 06/10/20 0049  WBC 6.4 5.9  NEUTROABS 4.6 4.0  HGB 10.0* 9.3*  HCT 33.2* 31.2*  MCV 79.2* 81.0  PLT 222 192   Cardiac Enzymes: No results for input(s): CKTOTAL, CKMB, CKMBINDEX, TROPONINI in the last 168 hours. BNP: Invalid input(s): POCBNP CBG: Recent Labs  Lab 06/10/20 0829 06/10/20 1215 06/10/20 1645 06/10/20 2001 06/13/20 1127  GLUCAP 90 148* 84 179* 201*   D-Dimer No results for input(s): DDIMER in the last 72 hours. Hgb A1c No results for input(s): HGBA1C in the last 72 hours. Lipid Profile No results for input(s): CHOL, HDL, LDLCALC, TRIG, CHOLHDL, LDLDIRECT in the last 72 hours. Thyroid function studies No results for input(s): TSH, T4TOTAL, T3FREE, THYROIDAB in the last 72 hours.  Invalid input(s): FREET3 Anemia work up No results for input(s): VITAMINB12, FOLATE, FERRITIN, TIBC, IRON, RETICCTPCT in the last 72 hours. Urinalysis    Component Value Date/Time  COLORURINE YELLOW 06/09/2020 [place] 06/09/2020 1724   LABSPEC  1.009 06/09/2020 1724   PHURINE 6.0 06/09/2020 1724   GLUCOSEU NEGATIVE 06/09/2020 1724   HGBUR NEGATIVE 06/09/2020 1724   [place] 06/09/2020 1724   BILIRUBINUR negative 03/18/2014 1014   KETONESUR NEGATIVE 06/09/2020 1724   PROTEINUR NEGATIVE 06/09/2020 1724   UROBILINOGEN 1.0 03/31/2014 1228   NITRITE NEGATIVE 06/09/2020 1724   LEUKOCYTESUR NEGATIVE 06/09/2020 1724   Sepsis Labs Invalid input(s): PROCALCITONIN,  WBC,  LACTICIDVEN Microbiology Recent Results (from the past 240 hour(s))  Culture, blood (Routine x 2)     Status: None (Preliminary result)   Collection Time: 06/09/20  3:25 PM   Specimen: BLOOD  Result Value Ref Range Status   Specimen Description   Final    BLOOD RIGHT ANTECUBITAL Performed at [facility], [place] [address] [place], [place] [address]    Special Requests   Final    BOTTLES DRAWN AEROBIC AND ANAEROBIC Blood Culture adequate volume Performed at [facility] [address], [place], [place] [address]    Culture   Final    NO GROWTH 4 DAYS Performed at [facility], [place] [address] [place], [place] [address]    Report Status PENDING  Incomplete  Respiratory Panel by RT PCR (Flu A&B, Covid) - Nasopharyngeal Swab     Status: None   Collection Time: 06/09/20  3:40 PM   Specimen: Nasopharyngeal Swab  Result Value Ref Range Status   SARS Coronavirus 2 by RT PCR NEGATIVE NEGATIVE Final    Comment: (NOTE) SARS-CoV-2 target nucleic acids are NOT DETECTED.  The SARS-CoV-2 RNA is generally detectable in upper respiratoy specimens during the acute phase of infection. The lowest concentration of SARS-CoV-2 viral copies this assay can detect is 131 copies/mL. A negative result does not preclude SARS-Cov-2 infection and should not be used as the sole basis for treatment or other patient management decisions. A negative result may occur with  improper specimen collection/handling, submission of specimen  other than nasopharyngeal swab, presence of viral mutation(s) within the areas targeted by this assay, and inadequate number of viral copies (<131 copies/mL). A negative result must be combined with clinical observations, patient history, and epidemiological information. The expected result is Negative.  Fact Sheet for Patients:  PinkCheek.be  Fact Sheet for Healthcare Providers:  GravelBags.it  This test is no t yet approved or cleared by the [place] FDA and  has been authorized for detection and/or diagnosis of SARS-CoV-2 by FDA under an Emergency Use Authorization (EUA). This EUA will remain  in effect (meaning this test can be used) for the duration of the COVID-19 declaration under Section 564(b)(1) of the Act, 21 U.S.C. section 360bbb-3(b)(1), unless the authorization is terminated or revoked sooner.     Influenza A by PCR NEGATIVE NEGATIVE Final   Influenza B by PCR NEGATIVE NEGATIVE Final    Comment: (NOTE) The Xpert Xpress SARS-CoV-2/FLU/RSV assay is intended as an aid in  the diagnosis of influenza from Nasopharyngeal swab specimens and  should not be used as a sole basis for treatment. Nasal washings and  aspirates are unacceptable for Xpert Xpress SARS-CoV-2/FLU/RSV  testing.  Fact Sheet for Patients: PinkCheek.be  Fact Sheet for Healthcare Providers: GravelBags.it  This test is not yet approved or cleared by the [place] FDA and  has been authorized for detection and/or diagnosis of SARS-CoV-2 by  FDA under an Emergency Use Authorization (EUA). This EUA will  remain  in effect (meaning this test can be used) for the duration of the  Covid-19 declaration under Section 564(b)(1) of the Act, 21  U.S.C. section 360bbb-3(b)(1), unless the authorization is  terminated or revoked. Performed at [facility], [place] [address] [place], [place] [address]   MRSA PCR Screening     Status: None   Collection Time: 06/10/20 12:02 AM   Specimen: Nasal Mucosa; Nasopharyngeal  Result Value Ref Range Status   MRSA by PCR NEGATIVE NEGATIVE Final    Comment:        The GeneXpert MRSA Assay (FDA approved for NASAL specimens only), is one component of a comprehensive MRSA colonization surveillance program. It is not intended to diagnose MRSA infection nor to guide or monitor treatment for MRSA infections. Performed at [facility], [place] [address], [place], [place] [address]   Culture, blood (Routine x 2)     Status: None (Preliminary result)   Collection Time: 06/10/20 10:12 AM   Specimen: BLOOD RIGHT HAND  Result Value Ref Range Status   Specimen Description   Final    BLOOD RIGHT HAND Performed at [facility] [address] [place], [place] [address]    Special Requests   Final    BOTTLES DRAWN AEROBIC AND ANAEROBIC Blood Culture adequate volume Performed at [facility] [address] [place], [place] [address]    Culture   Final    NO GROWTH 3 DAYS Performed at [facility], [place] [address] [place],  [address]    Report Status PENDING  Incomplete     Time coordinating discharge:  38 minutes  SIGNED:   Georgette Shell, MD  [facility] 06/13/2020, 12:55 PM

## 2020-06-13 NOTE — Care Management Important Message (Signed)
Important Message  Patient Details IM Letter given to the Patient Name: MEKEL HAVERSTOCK MRN: [id number] Date of Birth: 10-18-1933   Medicare Important Message Given:  Yes     Kerin Salen 06/13/2020, 11:21 AM

## 2020-06-13 NOTE — TOC Progression Note (Signed)
Transition of Care [facility]) - Progression Note    Patient Details  Name: WITTEN CERTAIN MRN: [id number] Date of Birth: 07/07/1934  Transition of Care [facility]) CM/SW Contact  Ross Ludwig, [place] Phone Number: 06/13/2020, 12:48 PM  Clinical Narrative:     CSW spoke with patient's daughter [name] and confirmed they would like patient to go to [facility].  CSW spoke to [facility], they can accept patient today pending negative Covid test.  CSW spoke to physician and bedside nurse, test will be ordered.    Expected Discharge Plan: [place] Barriers to Discharge: Continued Medical Work up  Expected Discharge Plan and Services Expected Discharge Plan: [place] In-house Referral: Clinical Social Work   Post Acute Care Choice: [place] Living arrangements for the past 2 months: [place], [place]                                       Social Determinants of Health (SDOH) Interventions    Readmission Risk Interventions Readmission Risk Prevention Plan 06/11/2020  Transportation Screening Complete  Medication Review Press photographer) Complete  SW Recovery Care/Counseling Consult Complete  Palliative Care Screening Not Applicable  Skilled Nursing Facility Complete  Some recent data might be hidden

## 2020-06-13 NOTE — Progress Notes (Signed)
PROGRESS NOTE    Adrian Singh  ZPH:[id number] DOB: [birth year] DOA: 06/09/2020 PCP: [name], MD    Brief Narrative: 86 year old male with past medical history of coronary artery disease, paroxysmal atrial fibrillation on Xarelto, noninsulin-dependent diabetes mellitus type 2, recent acute stroke 04/2020, hypertension, chronic kidney disease stage IIIa and metastatic prostate cancer to bone who presents to [facility] emergency department with complaints of severe lightheadedness and weakness.  Since the patient's recent discharge from [facility] in August, patient has been undergoing rehabilitation in a local skilled nursing facility.  Patient explains that over the past several weeks patient has participated with physical therapy has been experiencing episodes of intense lightheadedness.  Patient describes these episodes of lightheadedness as "feeling like I am going to pass out."  He states that typically these episodes occur with attempting to rise from a seated position and are associated with generalized weakness.  Upon further questioning patient also complains of some associated palpitations as well as occasional vague chest discomfort.  Chest discomfort is brief, mild and midsternal in location.  Denies recently initiated medications.  Patient denies sick contacts, recent travel or confirmed contact with known COVID-19 infection.  Patient was discharged home from his skilled nursing facility the morning of 9/23.  Upon arriving home, the daughter noticed that the patient nearly passed out after getting out of the car to walk to the house.  This prompted the patient being brought to [name] emergency department for evaluation.  Upon evaluation in emergency department, serial orthostatic vital signs were positive.  Patient was hydrated with 1 L of normal saline with minimal improvement of symptoms.  Due to patient's complicated medical history and  ongoing symptoms the emergency department provider has called the hospitalist group to evaluate the patient for admission to the hospital.   Assessment & Plan:   Principal Problem:   Postural dizziness with presyncope Active Problems:   Essential hypertension   Uncontrolled type 2 diabetes mellitus with diabetic polyneuropathy, without long-term current use of insulin (HCC)   PAF (paroxysmal atrial fibrillation) (HCC)   Orthostatic hypotension   Primary malignant neoplasm of prostate metastatic to bone (HCC)   Chronic kidney disease, stage 3a   Protein-calorie malnutrition, severe  #1 orthostatic hypotension with hypertension patient admitted with presyncope and is clearly orthostatic during the hospital stay. This could be related to decreased oral intake, use of Lasix and Cardura and autonomic neuropathy from longstanding diabetes. Patient reports his dizziness is better than on admission.  He walked with physical therapy yesterday.  They recommend skilled nursing facility.  Family aware Case manager aware waiting for placement.  He is medically stable to be discharged.  Continue Cardura.  I have held Flomax throughout the hospital stay due to postural dizziness.Review of his outside records show that patient has been deteriorating for over a month or 2 to with decreased appetite weight loss.  This was thought to be may be secondary to [facility] that he is taking at home for prostate cancer.  #2 HTN-his blood pressure is 156/75 with a heart rate of 73.  Continue current dose of metoprolol.  Unfortunately he will have to tolerate blood pressure on the higher side due to orthostatic hypotension.     #3type 2 dm-on Metformin and glyburide prior to admission continue SSI CBG (last 3)  Recent Labs    06/10/20 1215 06/10/20 1645 06/10/20 2001  GLUCAP 148* 84 179*     #4  Chronic PAF on  xarelto Rate controlled on metoprolol  #5 CKD stage IIIa stable monitor   #6 history of prostate  malignancy on Xtandi  #7 CVA August 2021 continue Xarelto and Lipitor  #8 stage I pressure ulcer on coccyx present on admission  #9 acquired thrombophilia with chronic A. fib high risk for stroke on Xarelto  #10 patient had 6 beats of V. tach by telemetry monitor.  He was asymptomatic and was sleeping during these episodes.  Magnesium is 1.5 potassium is 3.4 replete both and recheck labs in a.m.  Continue metoprolol.   Pressure Injury 05/03/20 Coccyx Stage 1 -  Intact skin with non-blanchable redness of a localized area usually over a bony prominence. pink (Active)  05/03/20 0908  Location: Coccyx  Location Orientation:   Staging: Stage 1 -  Intact skin with non-blanchable redness of a localized area usually over a bony prominence.  Wound Description (Comments): pink  Present on Admission:    Estimated body mass index is 20.91 kg/m as calculated from the following:   Height as of this encounter: 5\' 10"  (1.778 m).   Weight as of this encounter: 66.1 kg.  DVT prophylaxis: xarelto Code Status: full Family Communication: none at bedside Disposition Plan:  Status TS:[id number]  Dispo: The patient is from: SNF              Anticipated d/c is to: SNF              Anticipated d/c date is: 2 days              Patient currently is medically stable to dc  Patient is extremely orthostatic and is on IV fluids    Consultants:   none  Procedures:none Antimicrobials:none  Subjective:resting in bed in nad Feels better Objective: Vitals:   06/12/20 1341 06/12/20 1842 06/12/20 2143 06/13/20 0545  BP: (!) 143/69 130/70 132/75 (!) 156/75  Pulse: 79 73 73 73  Resp: 19 20 18 16   Temp: 97.7 F (36.5 C) 98.3 F (36.8 C) 98.3 F (36.8 C) 97.6 F (36.4 C)  TempSrc: Oral Oral Oral Oral  SpO2: 97% 95% 95% 95%  Weight:      Height:        Intake/Output Summary (Last 24 hours) at 06/13/2020 1109 Last data filed at 06/13/2020 0900 Gross per 24 hour  Intake 843.5 ml  Output 300 ml  Net  543.5 ml   Filed Weights   06/09/20 1456 06/10/20 0600  Weight: 71.7 kg 66.1 kg    Examination:  General exam: Appears calm and comfortable  Respiratory system: Clear to auscultation. Respiratory effort normal. Cardiovascular system: S1 & S2 heard, RRR. No JVD, murmurs, rubs, gallops or clicks. No pedal edema. Gastrointestinal system: Abdomen is nondistended, soft and nontender. No organomegaly or masses felt. Normal bowel sounds heard. Central nervous system: Alert and oriented. No focal neurological deficits. Extremities: Symmetric 5 x 5 power. Skin: No rashes, lesions or ulcers Psychiatry: Judgement and insight appear normal. Mood & affect appropriate.     Data Reviewed: I have personally reviewed following labs and imaging studies  CBC: Recent Labs  Lab 06/09/20 1521 06/10/20 0049  WBC 6.4 5.9  NEUTROABS 4.6 4.0  HGB 10.0* 9.3*  HCT 33.2* 31.2*  MCV 79.2* 81.0  PLT 222 092   Basic Metabolic Panel: Recent Labs  Lab 06/09/20 1521 06/10/20 0049 06/12/20 0724  NA 142 140 138  K 3.4* 3.2* 3.4*  CL 99 102 102  CO2 28 27 27   GLUCOSE  118* 95 139*  BUN 18 16 9   CREATININE 1.00 0.86 0.62  CALCIUM 8.0* 7.0* 7.6*  MG  --  1.6* 1.5*   GFR: Estimated Creatinine Clearance: 65.4 mL/min (by C-G formula based on SCr of 0.62 mg/dL). Liver Function Tests: Recent Labs  Lab 06/09/20 1521 06/10/20 0049  AST 35 30  ALT 27 21  ALKPHOS 317* 299*  BILITOT 0.5 0.4  PROT 7.7 6.7  ALBUMIN 2.3* 2.2*   No results for input(s): LIPASE, AMYLASE in the last 168 hours. No results for input(s): AMMONIA in the last 168 hours. Coagulation Profile: Recent Labs  Lab 06/09/20 1521  INR 1.5*   Cardiac Enzymes: No results for input(s): CKTOTAL, CKMB, CKMBINDEX, TROPONINI in the last 168 hours. BNP (last 3 results) No results for input(s): PROBNP in the last 8760 hours. HbA1C: No results for input(s): HGBA1C in the last 72 hours. CBG: Recent Labs  Lab 06/10/20 0759  06/10/20 0829 06/10/20 1215 06/10/20 1645 06/10/20 2001  GLUCAP 56* 90 148* 84 179*   Lipid Profile: No results for input(s): CHOL, HDL, LDLCALC, TRIG, CHOLHDL, LDLDIRECT in the last 72 hours. Thyroid Function Tests: No results for input(s): TSH, T4TOTAL, FREET4, T3FREE, THYROIDAB in the last 72 hours. Anemia Panel: No results for input(s): VITAMINB12, FOLATE, FERRITIN, TIBC, IRON, RETICCTPCT in the last 72 hours. Sepsis Labs: Recent Labs  Lab 06/09/20 1521 06/09/20 1900  LATICACIDVEN 2.1* 1.5    Recent Results (from the past 240 hour(s))  Culture, blood (Routine x 2)     Status: None (Preliminary result)   Collection Time: 06/09/20  3:25 PM   Specimen: BLOOD  Result Value Ref Range Status   Specimen Description   Final    BLOOD RIGHT ANTECUBITAL Performed at [facility] [address] [place], [place] [address]    Special Requests   Final    BOTTLES DRAWN AEROBIC AND ANAEROBIC Blood Culture adequate volume Performed at [place] [address] [place], [place] [address]    Culture   Final    NO GROWTH 4 DAYS Performed at [facility], [place] [address] [place], [place] [address]    Report Status PENDING  Incomplete  Respiratory Panel by RT PCR (Flu A&B, Covid) - Nasopharyngeal Swab     Status: None   Collection Time: 06/09/20  3:40 PM   Specimen: Nasopharyngeal Swab  Result Value Ref Range Status   SARS Coronavirus 2 by RT PCR NEGATIVE NEGATIVE Final    Comment: (NOTE) SARS-CoV-2 target nucleic acids are NOT DETECTED.  The SARS-CoV-2 RNA is generally detectable in upper respiratoy specimens during the acute phase of infection. The lowest concentration of SARS-CoV-2 viral copies this assay can detect is 131 copies/mL. A negative result does not preclude SARS-Cov-2 infection and should not be used as the sole basis for treatment or other patient management decisions. A negative result may occur with  improper  specimen collection/handling, submission of specimen other than nasopharyngeal swab, presence of viral mutation(s) within the areas targeted by this assay, and inadequate number of viral copies (<131 copies/mL). A negative result must be combined with clinical observations, patient history, and epidemiological information. The expected result is Negative.  Fact Sheet for Patients:  PinkCheek.be  Fact Sheet for Healthcare Providers:  GravelBags.it  This test is no t yet approved or cleared by the [place] FDA and  has been authorized for detection and/or diagnosis of SARS-CoV-2 by FDA under an Emergency Use Authorization (EUA). This  EUA will remain  in effect (meaning this test can be used) for the duration of the COVID-19 declaration under Section 564(b)(1) of the Act, 21 U.S.C. section 360bbb-3(b)(1), unless the authorization is terminated or revoked sooner.     Influenza A by PCR NEGATIVE NEGATIVE Final   Influenza B by PCR NEGATIVE NEGATIVE Final    Comment: (NOTE) The Xpert Xpress SARS-CoV-2/FLU/RSV assay is intended as an aid in  the diagnosis of influenza from Nasopharyngeal swab specimens and  should not be used as a sole basis for treatment. Nasal washings and  aspirates are unacceptable for Xpert Xpress SARS-CoV-2/FLU/RSV  testing.  Fact Sheet for Patients: PinkCheek.be  Fact Sheet for Healthcare Providers: GravelBags.it  This test is not yet approved or cleared by the [place] FDA and  has been authorized for detection and/or diagnosis of SARS-CoV-2 by  FDA under an Emergency Use Authorization (EUA). This EUA will remain  in effect (meaning this test can be used) for the duration of the  Covid-19 declaration under Section 564(b)(1) of the Act, 21  U.S.C. section 360bbb-3(b)(1), unless the authorization is  terminated or revoked. Performed at  [facility], [place] [address], [place], [place] [address]   MRSA PCR Screening     Status: None   Collection Time: 06/10/20 12:02 AM   Specimen: Nasal Mucosa; Nasopharyngeal  Result Value Ref Range Status   MRSA by PCR NEGATIVE NEGATIVE Final    Comment:        The GeneXpert MRSA Assay (FDA approved for NASAL specimens only), is one component of a comprehensive MRSA colonization surveillance program. It is not intended to diagnose MRSA infection nor to guide or monitor treatment for MRSA infections. Performed at [facility], [place] [address] [place], [place] [address]   Culture, blood (Routine x 2)     Status: None (Preliminary result)   Collection Time: 06/10/20 10:12 AM   Specimen: BLOOD RIGHT HAND  Result Value Ref Range Status   Specimen Description   Final    BLOOD RIGHT HAND Performed at [facility] [address] [place], [place] [address]    Special Requests   Final    BOTTLES DRAWN AEROBIC AND ANAEROBIC Blood Culture adequate volume Performed at [facility] [address], [place], [place] [address]    Culture   Final    NO GROWTH 3 DAYS Performed at [facility], [place] [address] [place], [place] [address]    Report Status PENDING  Incomplete         Radiology Studies: No results found.      Scheduled Meds: . atorvastatin  20 mg Oral Daily  . enzalutamide  80 mg Oral Daily  . feeding supplement (ENSURE ENLIVE)  237 mL Oral TID BM  . metoprolol succinate  50 mg Oral Daily  . multivitamin with minerals  1 tablet Oral Daily  . ramipril  2.5 mg Oral Daily  . rivaroxaban  20 mg Oral Q supper   Continuous Infusions: . lactated ringers 100 mL/hr at 06/12/20 0132     LOS: 3 days     Georgette Shell, MD 06/13/2020, 11:09 AM

## 2020-06-13 NOTE — TOC Transition Note (Signed)
Transition of Care [facility]) - CM/SW Discharge Note   Patient Details  Name: Adrian Singh MRN: [id number] Date of Birth: 1934/09/09  [facility] CM/SW Contact:  Ross Ludwig, LCSW Phone Number: 06/13/2020, 3:26 PM   Clinical Narrative:     Patient to be d/c'ed today to [facility].  Patient and family agreeable to plans will transport via ems RN to call report to room 111 [phone].     Final next level of care: Skilled Nursing Facility Barriers to Discharge: Barriers Resolved   Patient Goals and CMS Choice Patient states their goals for this hospitalization and ongoing recovery are:: To go to SNF for rehab, then return back home. CMS Medicare.gov Compare Post Acute Care list provided to:: Patient Represenative (must comment) Choice offered to / list presented to : Adult Children  Discharge Placement   Existing PASRR number confirmed : 06/12/20          Patient chooses bed at: [facility] Patient to be transferred to facility by: [facility] Name of family member notified: Morene Rankins patient's daughter. Patient and family notified of of transfer: 06/13/20  Discharge Plan and Services In-house Referral: Clinical Social Work   Post Acute Care Choice: [name]            DME Agency: NA       HH Arranged: NA          Social Determinants of Health (SDOH) Interventions     Readmission Risk Interventions Readmission Risk Prevention Plan 06/11/2020  Transportation Screening Complete  Medication Review Press photographer) Complete  SW Recovery Care/Counseling Consult Complete  Palliative Care Screening Not Applicable  Skilled Nursing Facility Complete  Some recent data might be hidden

## 2020-06-14 DIAGNOSIS — I10 Essential (primary) hypertension: Secondary | ICD-10-CM | POA: Diagnosis not present

## 2020-06-14 DIAGNOSIS — I480 Paroxysmal atrial fibrillation: Secondary | ICD-10-CM | POA: Diagnosis not present

## 2020-06-14 DIAGNOSIS — E1142 Type 2 diabetes mellitus with diabetic polyneuropathy: Secondary | ICD-10-CM | POA: Diagnosis not present

## 2020-06-14 DIAGNOSIS — R531 Weakness: Secondary | ICD-10-CM | POA: Diagnosis not present

## 2020-06-14 LAB — CULTURE, BLOOD (ROUTINE X 2)
Culture: NO GROWTH
Special Requests: ADEQUATE

## 2020-06-15 LAB — CULTURE, BLOOD (ROUTINE X 2)
Culture: NO GROWTH
Special Requests: ADEQUATE

## 2020-06-18 ENCOUNTER — Inpatient Hospital Stay (HOSPITAL_COMMUNITY)
Admission: EM | Admit: 2020-06-18 | Discharge: 2020-06-24 | DRG: 312 | Disposition: A | Discharge status: Skilled Nursing Facility | Payer: Medicare Other | Source: Skilled Nursing Facility | Attending: Internal Medicine | Admitting: Internal Medicine

## 2020-06-18 ENCOUNTER — Other Ambulatory Visit: Payer: Self-pay

## 2020-06-18 ENCOUNTER — Emergency Department (HOSPITAL_COMMUNITY): Payer: Medicare Other

## 2020-06-18 DIAGNOSIS — I5032 Chronic diastolic (congestive) heart failure: Secondary | ICD-10-CM | POA: Diagnosis not present

## 2020-06-18 DIAGNOSIS — R5381 Other malaise: Secondary | ICD-10-CM | POA: Diagnosis not present

## 2020-06-18 DIAGNOSIS — E876 Hypokalemia: Secondary | ICD-10-CM | POA: Diagnosis not present

## 2020-06-18 DIAGNOSIS — I10 Essential (primary) hypertension: Secondary | ICD-10-CM | POA: Diagnosis present

## 2020-06-18 DIAGNOSIS — R531 Weakness: Secondary | ICD-10-CM | POA: Diagnosis not present

## 2020-06-18 DIAGNOSIS — Z7901 Long term (current) use of anticoagulants: Secondary | ICD-10-CM | POA: Diagnosis not present

## 2020-06-18 DIAGNOSIS — E43 Unspecified severe protein-calorie malnutrition: Secondary | ICD-10-CM | POA: Diagnosis present

## 2020-06-18 DIAGNOSIS — Z85828 Personal history of other malignant neoplasm of skin: Secondary | ICD-10-CM

## 2020-06-18 DIAGNOSIS — Z87442 Personal history of urinary calculi: Secondary | ICD-10-CM | POA: Diagnosis not present

## 2020-06-18 DIAGNOSIS — Z6820 Body mass index (BMI) 20.0-20.9, adult: Secondary | ICD-10-CM

## 2020-06-18 DIAGNOSIS — C61 Malignant neoplasm of prostate: Secondary | ICD-10-CM | POA: Diagnosis not present

## 2020-06-18 DIAGNOSIS — D649 Anemia, unspecified: Secondary | ICD-10-CM | POA: Diagnosis not present

## 2020-06-18 DIAGNOSIS — I959 Hypotension, unspecified: Secondary | ICD-10-CM | POA: Diagnosis not present

## 2020-06-18 DIAGNOSIS — E1122 Type 2 diabetes mellitus with diabetic chronic kidney disease: Secondary | ICD-10-CM | POA: Diagnosis present

## 2020-06-18 DIAGNOSIS — Z8673 Personal history of transient ischemic attack (TIA), and cerebral infarction without residual deficits: Secondary | ICD-10-CM | POA: Diagnosis not present

## 2020-06-18 DIAGNOSIS — I130 Hypertensive heart and chronic kidney disease with heart failure and stage 1 through stage 4 chronic kidney disease, or unspecified chronic kidney disease: Secondary | ICD-10-CM | POA: Diagnosis not present

## 2020-06-18 DIAGNOSIS — M1990 Unspecified osteoarthritis, unspecified site: Secondary | ICD-10-CM | POA: Diagnosis present

## 2020-06-18 DIAGNOSIS — I69354 Hemiplegia and hemiparesis following cerebral infarction affecting left non-dominant side: Secondary | ICD-10-CM | POA: Diagnosis not present

## 2020-06-18 DIAGNOSIS — I480 Paroxysmal atrial fibrillation: Secondary | ICD-10-CM | POA: Diagnosis present

## 2020-06-18 DIAGNOSIS — I951 Orthostatic hypotension: Principal | ICD-10-CM | POA: Diagnosis present

## 2020-06-18 DIAGNOSIS — E1142 Type 2 diabetes mellitus with diabetic polyneuropathy: Secondary | ICD-10-CM | POA: Diagnosis not present

## 2020-06-18 DIAGNOSIS — M6281 Muscle weakness (generalized): Secondary | ICD-10-CM | POA: Diagnosis not present

## 2020-06-18 DIAGNOSIS — I25119 Atherosclerotic heart disease of native coronary artery with unspecified angina pectoris: Secondary | ICD-10-CM | POA: Diagnosis not present

## 2020-06-18 DIAGNOSIS — I69322 Dysarthria following cerebral infarction: Secondary | ICD-10-CM | POA: Diagnosis not present

## 2020-06-18 DIAGNOSIS — D630 Anemia in neoplastic disease: Secondary | ICD-10-CM | POA: Diagnosis present

## 2020-06-18 DIAGNOSIS — I2510 Atherosclerotic heart disease of native coronary artery without angina pectoris: Secondary | ICD-10-CM | POA: Diagnosis not present

## 2020-06-18 DIAGNOSIS — C7951 Secondary malignant neoplasm of bone: Secondary | ICD-10-CM | POA: Diagnosis present

## 2020-06-18 DIAGNOSIS — N1831 Chronic kidney disease, stage 3a: Secondary | ICD-10-CM | POA: Diagnosis not present

## 2020-06-18 DIAGNOSIS — Z955 Presence of coronary angioplasty implant and graft: Secondary | ICD-10-CM | POA: Diagnosis not present

## 2020-06-18 DIAGNOSIS — R197 Diarrhea, unspecified: Secondary | ICD-10-CM | POA: Diagnosis present

## 2020-06-18 DIAGNOSIS — Z20822 Contact with and (suspected) exposure to covid-19: Secondary | ICD-10-CM | POA: Diagnosis not present

## 2020-06-18 DIAGNOSIS — Z87891 Personal history of nicotine dependence: Secondary | ICD-10-CM

## 2020-06-18 DIAGNOSIS — R42 Dizziness and giddiness: Secondary | ICD-10-CM | POA: Diagnosis not present

## 2020-06-18 DIAGNOSIS — Z8249 Family history of ischemic heart disease and other diseases of the circulatory system: Secondary | ICD-10-CM

## 2020-06-18 DIAGNOSIS — Z888 Allergy status to other drugs, medicaments and biological substances status: Secondary | ICD-10-CM | POA: Diagnosis not present

## 2020-06-18 DIAGNOSIS — E440 Moderate protein-calorie malnutrition: Secondary | ICD-10-CM | POA: Diagnosis not present

## 2020-06-18 DIAGNOSIS — Z23 Encounter for immunization: Secondary | ICD-10-CM | POA: Diagnosis not present

## 2020-06-18 DIAGNOSIS — Z79899 Other long term (current) drug therapy: Secondary | ICD-10-CM

## 2020-06-18 DIAGNOSIS — N400 Enlarged prostate without lower urinary tract symptoms: Secondary | ICD-10-CM | POA: Diagnosis present

## 2020-06-18 DIAGNOSIS — E1165 Type 2 diabetes mellitus with hyperglycemia: Secondary | ICD-10-CM | POA: Diagnosis present

## 2020-06-18 DIAGNOSIS — R000 Tachycardia, unspecified: Secondary | ICD-10-CM | POA: Diagnosis not present

## 2020-06-18 DIAGNOSIS — R1312 Dysphagia, oropharyngeal phase: Secondary | ICD-10-CM | POA: Diagnosis not present

## 2020-06-18 DIAGNOSIS — I5030 Unspecified diastolic (congestive) heart failure: Secondary | ICD-10-CM | POA: Diagnosis not present

## 2020-06-18 DIAGNOSIS — R54 Age-related physical debility: Secondary | ICD-10-CM | POA: Diagnosis present

## 2020-06-18 DIAGNOSIS — Z823 Family history of stroke: Secondary | ICD-10-CM | POA: Diagnosis not present

## 2020-06-18 DIAGNOSIS — Z801 Family history of malignant neoplasm of trachea, bronchus and lung: Secondary | ICD-10-CM

## 2020-06-18 DIAGNOSIS — R3911 Hesitancy of micturition: Secondary | ICD-10-CM | POA: Diagnosis present

## 2020-06-18 DIAGNOSIS — I4891 Unspecified atrial fibrillation: Secondary | ICD-10-CM | POA: Diagnosis not present

## 2020-06-18 DIAGNOSIS — IMO0002 Reserved for concepts with insufficient information to code with codable children: Secondary | ICD-10-CM | POA: Diagnosis present

## 2020-06-18 DIAGNOSIS — R627 Adult failure to thrive: Secondary | ICD-10-CM | POA: Diagnosis not present

## 2020-06-18 DIAGNOSIS — G319 Degenerative disease of nervous system, unspecified: Secondary | ICD-10-CM | POA: Diagnosis not present

## 2020-06-18 DIAGNOSIS — Z825 Family history of asthma and other chronic lower respiratory diseases: Secondary | ICD-10-CM

## 2020-06-18 DIAGNOSIS — R55 Syncope and collapse: Secondary | ICD-10-CM | POA: Diagnosis not present

## 2020-06-18 LAB — RESPIRATORY PANEL BY RT PCR (FLU A&B, COVID)
Influenza A by PCR: NEGATIVE
Influenza B by PCR: NEGATIVE
SARS Coronavirus 2 by RT PCR: NEGATIVE

## 2020-06-18 LAB — COMPREHENSIVE METABOLIC PANEL
ALT: 21 U/L (ref 0–44)
AST: 38 U/L (ref 15–41)
Albumin: 2 g/dL — ABNORMAL LOW (ref 3.5–5.0)
Alkaline Phosphatase: 346 U/L — ABNORMAL HIGH (ref 38–126)
Anion gap: 12 (ref 5–15)
BUN: 13 mg/dL (ref 8–23)
CO2: 22 mmol/L (ref 22–32)
Calcium: 7.5 mg/dL — ABNORMAL LOW (ref 8.9–10.3)
Chloride: 106 mmol/L (ref 98–111)
Creatinine, Ser: 0.76 mg/dL (ref 0.61–1.24)
GFR calc Af Amer: >60 mL/min (ref >60)
GFR calc non Af Amer: >60 mL/min (ref >60)
Glucose, Bld: 150 mg/dL — ABNORMAL HIGH (ref 70–99)
Potassium: 3.6 mmol/L (ref 3.5–5.1)
Sodium: 140 mmol/L (ref 135–145)
Total Bilirubin: 0.9 mg/dL (ref 0.3–1.2)
Total Protein: 6.5 g/dL (ref 6.5–8.1)

## 2020-06-18 LAB — CBC WITH DIFFERENTIAL/PLATELET
Abs Immature Granulocytes: 0.31 10*3/uL — ABNORMAL HIGH (ref 0.00–0.07)
Basophils Absolute: 0 10*3/uL (ref 0.0–0.1)
Basophils Relative: 0 %
Eosinophils Absolute: 0 10*3/uL (ref 0.0–0.5)
Eosinophils Relative: 0 %
HCT: 26 % — ABNORMAL LOW (ref 39.0–52.0)
Hemoglobin: 8 g/dL — ABNORMAL LOW (ref 13.0–17.0)
Immature Granulocytes: 3 %
Lymphocytes Relative: 9 %
Lymphs Abs: 0.8 10*3/uL (ref 0.7–4.0)
MCH: 24.1 pg — ABNORMAL LOW (ref 26.0–34.0)
MCHC: 30.8 g/dL (ref 30.0–36.0)
MCV: 78.3 fL — ABNORMAL LOW (ref 80.0–100.0)
Monocytes Absolute: 0.7 10*3/uL (ref 0.1–1.0)
Monocytes Relative: 8 %
Neutro Abs: 7.3 10*3/uL (ref 1.7–7.7)
Neutrophils Relative %: 80 %
Platelets: 206 10*3/uL (ref 150–400)
RBC: 3.32 MIL/uL — ABNORMAL LOW (ref 4.22–5.81)
RDW: 21 % — ABNORMAL HIGH (ref 11.5–15.5)
WBC: 9.2 10*3/uL (ref 4.0–10.5)
nRBC: 0.2 % (ref 0.0–0.2)

## 2020-06-18 LAB — URINALYSIS, ROUTINE W REFLEX MICROSCOPIC
Bilirubin Urine: NEGATIVE
Glucose, UA: NEGATIVE mg/dL
Hgb urine dipstick: NEGATIVE
Ketones, ur: 5 mg/dL — AB
Leukocytes,Ua: NEGATIVE
Nitrite: NEGATIVE
Protein, ur: NEGATIVE mg/dL
Specific Gravity, Urine: 1.009 (ref 1.005–1.030)
pH: 7 (ref 5.0–8.0)

## 2020-06-18 LAB — MAGNESIUM: Magnesium: 2 mg/dL (ref 1.7–2.4)

## 2020-06-18 MED ORDER — SODIUM CHLORIDE 0.9 % IV BOLUS
500.0000 mL | Freq: Once | INTRAVENOUS | Status: AC
  Administered 2020-06-18: 500 mL via INTRAVENOUS

## 2020-06-18 NOTE — ED Provider Notes (Signed)
Medical screening examination/treatment/procedure(s) were conducted as a shared visit with non-physician practitioner(s) and myself.  I personally evaluated the patient during the encounter.  EKG Interpretation  Date/Time:  Saturday June 18 2020 19:46:11 EDT Ventricular Rate:  74 PR Interval:    QRS Duration: 84 QT Interval:  429 QTC Calculation: 476 R Axis:   -175 Text Interpretation: Sinus rhythm Low voltage, extremity leads Posterior infarct, old Nonspecific T abnormalities, lateral leads Confirmed by Lacretia Leigh ([id number]) on 06/18/2020 10:96:85 PM   86 year old male here presents with low blood pressure.  Has a history of orthostatic hypotension.  Was that tonight and responded well to fluids.  Labs are reassuring here with exception of hemoglobin is slightly decreased.  Will perform orthostatic vital signs here.  Will discharge if we able to have patient asymptomatic prior to leaving   Lacretia Leigh, MD 06/18/20 2226

## 2020-06-18 NOTE — ED Triage Notes (Signed)
Pt came in via [facility] from [facility] with c/o weakness. On arrival, EMS noted his B/P was 80s/40s. 433ml bolus administered via EMS. Pt current B/P is 120s/60s. Pt alert and oriented x4. Pt states he has tried eating during meal time, "but his stomach can't hold much". He states that food does not taste the same.

## 2020-06-18 NOTE — ED Provider Notes (Signed)
[facility] DEPT Provider Note   CSN: [id number] Arrival date & time: 06/18/20  1925     History Chief Complaint  Patient presents with  . Weakness  . Hypotension    Adrian Singh is a 86 y.o. male presenting for evaluation of lightheadedness/weakness.  Patient states he has been feeling very weak and lightheaded today.  He states he was not feeling 100% yesterday, although that is when he is today.  He states symptoms are worse when he moves or stands up.  He states he does not like the taste of water, as such has not been drinking a lot of water.  He denies recent fever, chills, chest pain, shortness breath, cough, nausea, vomiting, abdominal pain, urinary symptoms, normal bowel movements.  He denies recent change in medications. He states he rarely walks now, but when he does he uses a walker.    Additional history obtained from EMS.  Per EMS, patient was hypotensive with a systolic blood pressure in the eighties upon arrival.  He received 400 cc of fluid IV, patient reports improvement of symptoms and EMS reports improvement of hypotension.  Additional history obtained in chart review.  Patient with a history of similar, has been married twice or issues with hypertension in the past several months.  Patient with a history of A. fib on on Xarelto.  Additional history of CAD, prostate cancer, stroke with residual left-sided weakness, diabetes, hypertension, ckd.  HPI     Past Medical History:  Diagnosis Date  . Arthritis    "legs, back" (10/06/2015)  . Atrial fibrillation ([place])    x1  . CAD (coronary artery disease)    nonobstructive  . Cancer ([place])    skin cancer on ear (froze it off) and back (cut it off)  . Dysrhythmia   . Esophageal reflux    hx of  . History of kidney stones   . Hx of heart artery stent   . Hypertrophy of prostate without urinary obstruction and other lower urinary tract symptoms (LUTS)   . Osteoarthrosis, unspecified  whether generalized or localized, unspecified site   . Prostate cancer ([place])   . Skin cancer   . Stroke [facility]) 03/2014   "had a series of mini strokes; maybe 4"; denies residual on 10/06/2015  . Thrombocytopenia ([place])   . Type II diabetes mellitus ([place])    type 2  . Unspecified essential hypertension     Patient Active Problem List   Diagnosis Date Noted  . Postural dizziness with presyncope 06/09/2020  . Protein-calorie malnutrition, severe 05/06/2020  . Palliative care by specialist   . Goals of care, counseling/discussion   . DNR (do not resuscitate) discussion   . FTT (failure to thrive) in adult   . Need for emotional support   . Pressure injury of skin 05/04/2020  . Stroke ([place]) 05/02/2020  . Generalized weakness 04/30/2020  . Fall at home, initial encounter 04/30/2020  . Acute on chronic anemia 04/30/2020  . Elevated CK 04/30/2020  . Chronic kidney disease, stage 3a ([place]) 04/30/2020  . Elevated troponin level not due myocardial infarction 04/30/2020  . Fatigue 04/29/2020  . Malnutrition of mild degree ([place]) 04/06/2019  . Diabetic foot ulcer ([place]) 04/06/2019  . Mood disorder ([place]) 04/06/2019  . Aortic atherosclerosis ([place]) 02/13/2017  . Prostate cancer ([place]) 02/13/2017  . Primary malignant neoplasm of prostate metastatic to bone ([place]) 02/13/2017  . Orthostatic hypotension 03/19/2016  . CAD S/P percutaneous coronary angioplasty 02/28/2016  .  Atherosclerosis of native coronary artery of native heart with angina pectoris ([place])   . Abnormal nuclear stress test 09/22/2015  . Thrombocytopenia ([place])   . Advanced directives, counseling/discussion 09/27/2014  . PAF (paroxysmal atrial fibrillation) ([place]) 04/12/2014  . History of CVA (cerebrovascular accident) 03/31/2014  . CKD stage 3 due to type 2 diabetes mellitus ([place])   . Uncontrolled type 2 diabetes mellitus with diabetic polyneuropathy, without long-term current use of insulin ([place]) 11/05/2011  . Routine general medical  examination at a health care facility 05/07/2011  . Essential hypertension 12/23/2009  . GERD 12/23/2009  . BPH (benign prostatic hyperplasia) 12/23/2009  . OSTEOARTHRITIS 12/23/2009    Past Surgical History:  Procedure Laterality Date  . BACK SURGERY     2001  . CARDIAC CATHETERIZATION  12/2007  . CARDIAC CATHETERIZATION N/A 09/22/2015   Procedure: Left Heart Cath and Coronary Angiography;  Surgeon: Peter M Martinique, MD;  Location: [facility] CV LAB;  Service: Cardiovascular;  Laterality: N/A;  . CARDIAC CATHETERIZATION N/A 10/06/2015   Procedure: Coronary Stent Intervention;  Surgeon: Peter M Martinique, MD;  Location: [facility] LAB;  Service: Cardiovascular;  Laterality: N/A;  . CARDIAC CATHETERIZATION N/A 02/29/2016   Procedure: Left Heart Cath and Coronary Angiography;  Surgeon: Jettie Booze, MD;  Location: [facility] LAB;  Service: Cardiovascular;  Laterality: N/A;  . COLONOSCOPY W/ BIOPSIES AND POLYPECTOMY    . CORONARY STENT PLACEMENT  10/06/2015   [name]  with DES  . EAR CYST EXCISION N/A 04/06/2015   Procedure: EXCISION OF SCALP CYST;  Surgeon: Donnie Mesa, MD;  Location: [facility];  Service: General;  Laterality: N/A;  . ESOPHAGOGASTRODUODENOSCOPY (EGD) WITH ESOPHAGEAL DILATION  2001   with dilation  . LUMBAR DISC SURGERY  09/26/1999   "cleaned out arthritis and bone spurs"  . SHOULDER ARTHROSCOPY W/ ROTATOR CUFF REPAIR Left 2005  . THULIUM LASER TURP (TRANSURETHRAL RESECTION OF PROSTATE) N/A 01/21/2017   Procedure: Marcelino Duster LASER TURP (TRANSURETHRAL RESECTION OF PROSTATE) CAUTERIZATION OF BLADDER LESION;  Surgeon: Franchot Gallo, MD;  Location: [facility];  Service: Urology;  Laterality: N/A;       Family History  Problem Relation Age of Onset  . Stroke Father   . Peripheral vascular disease Father        amputation  . Heart failure Mother        CHF  . Coronary artery disease Mother   . Heart attack Mother 12       Multiple  . COPD Brother   . Heart disease  Brother   . Cancer Brother        Bone  . Lung cancer Sister     Social History   Tobacco Use  . Smoking status: Former Smoker    Years: 3.00    Types: Cigarettes  . Smokeless tobacco: Never Used  . Tobacco comment: " Quit smoking by age 42; was a someday smoker "  Vaping Use  . Vaping Use: Never used  Substance Use Topics  . Alcohol use: No  . Drug use: No    Home Medications Prior to Admission medications   Medication Sig Start Date End Date Taking? Authorizing Provider  Amino Acids-Protein Hydrolys (FEEDING SUPPLEMENT, PRO-STAT SUGAR FREE 64,) LIQD Take 30 mLs by mouth in the morning and at bedtime.   Yes [provider]  Ascorbic Acid (VITAMIN C) 1000 MG tablet Take 1,000 mg by mouth daily.   Yes [provider]  atorvastatin (LIPITOR) 20 MG tablet  Take 20 mg by mouth at bedtime.    Yes [provider]  B Complex Vitamins (VITAMIN B COMPLEX PO) Take 1 tablet by mouth daily.   Yes [provider]  calcium carbonate (CALCIUM 600) 1500 (600 Ca) MG TABS tablet Take 600 mg of elemental calcium by mouth daily.    Yes [provider]  Cholecalciferol (VITAMIN D3) 1.25 MG (50000 UT) CAPS Take 50,000 Units by mouth once a week. Saturday   Yes [provider]  cyanocobalamin 1000 MCG tablet Take 1,000 mg by mouth at bedtime.    Yes [provider]  docusate sodium (COLACE) 100 MG capsule Take 200 mg by mouth 2 (two) times daily as needed for mild constipation.   Yes [provider]  doxazosin (CARDURA) 1 MG tablet Take 1 tablet (1 mg total) by mouth at bedtime. 05/12/20  Yes Charlynne Cousins, MD  enzalutamide Gillermina Phy) 80 MG tablet Take 80 mg by mouth daily.   Yes [provider]  glimepiride (AMARYL) 2 MG tablet Take 1 tablet (2 mg total) by mouth every morning. Patient taking differently: Take 2 mg by mouth daily.  06/13/20 06/13/21 Yes Georgette Shell, MD  metoprolol succinate (TOPROL-XL) 50 MG 24  hr tablet Take 1 tablet (50 mg total) by mouth daily. Take with or immediately following a meal. Patient taking differently: Take 50 mg by mouth daily.  06/14/20  Yes Georgette Shell, MD  Multiple Vitamin (MULTIVITAMIN WITH MINERALS) TABS tablet Take 1 tablet by mouth daily.    Yes [provider]  polyethylene glycol (MIRALAX / GLYCOLAX) 17 g packet Take 17 g by mouth daily.   Yes [provider]  ramipril (ALTACE) 2.5 MG capsule Take 1 capsule (2.5 mg total) by mouth daily. 06/14/20  Yes Georgette Shell, MD  rivaroxaban (XARELTO) 20 MG TABS tablet Take 1 tablet (20 mg total) by mouth daily with supper. 01/21/20  Yes Martinique, Peter M, MD  tamsulosin (FLOMAX) 0.4 MG CAPS capsule Take 1 capsule (0.4 mg total) by mouth daily. 06/25/19  Yes [name], MD  traMADol (ULTRAM) 50 MG tablet Take 1 tablet (50 mg total) by mouth 3 (three) times daily as needed. Patient taking differently: Take 50 mg by mouth 3 (three) times daily as needed for moderate pain.  06/13/20  Yes Georgette Shell, MD  acetaminophen (TYLENOL) 650 MG CR tablet Take 650 mg by mouth 2 (two) times daily as needed for pain.    [provider]  BAYER MICROLET LANCETS lancets Use to test blood sugar once daily dx: E11.40 10/18/15   [name], MD  Blood Glucose Monitoring Suppl (CONTOUR NEXT EZ MONITOR) w/Device KIT USE TO TEST BLOOD SUGAR ONCE DAILY. Dx Code E11.40 06/13/16   [name], MD  furosemide (LASIX) 40 MG tablet Take 1 tablet (40 mg total) by mouth daily as needed. Patient taking differently: Take 40 mg by mouth daily as needed for fluid.  05/15/20   Charlynne Cousins, MD  glucose blood (CONTOUR NEXT TEST) test strip Check blood sugar once daily and as directed.Dx Code E11.40 03/19/19   [name], MD  nitroGLYCERIN (NITROSTAT) 0.4 MG SL tablet Place 1 tablet (0.4 mg total) under the tongue every 5 (five) minutes as needed for chest pain. Patient taking differently:  Place 0.4 mg under the tongue every 5 (five) minutes as needed for chest pain. If no relief, call MD 05/15/18   [name], MD  Allergies    Soma [carisoprodol] and Doxazosin  Review of Systems   Review of Systems  Neurological: Positive for dizziness, weakness and light-headedness.  All other systems reviewed and are negative.   Physical Exam Updated Vital Signs BP (!) 144/68 (BP Location: Right Arm)   Pulse 62   Temp 98 F (36.7 C) (Oral)   Resp 15   Ht _0  (1.778 m)   Wt 65.8 kg   SpO2 97%   BMI 20.81 kg/m   Physical Exam Vitals and nursing note reviewed.  Constitutional:      General: He is not in acute distress.    Appearance: He is well-developed.     Comments: Appears nontoxic  HENT:     Head: Normocephalic and atraumatic.  Eyes:     Extraocular Movements: Extraocular movements intact.     Conjunctiva/sclera: Conjunctivae normal.     Pupils: Pupils are equal, round, and reactive to light.  Cardiovascular:     Rate and Rhythm: Normal rate and regular rhythm.     Pulses: Normal pulses.  Pulmonary:     Effort: Pulmonary effort is normal. No respiratory distress.     Breath sounds: Normal breath sounds. No wheezing.  Abdominal:     General: There is no distension.     Palpations: Abdomen is soft. There is no mass.     Tenderness: There is no abdominal tenderness. There is no guarding or rebound.  Musculoskeletal:        General: Normal range of motion.     Cervical back: Normal range of motion and neck supple.     Comments: L sided weakness, baseline  Skin:    General: Skin is warm and dry.     Capillary Refill: Capillary refill takes less than 2 seconds.  Neurological:     Mental Status: He is alert and oriented to person, place, and time.     ED Results / Procedures / Treatments   Labs (all labs ordered are listed, but only abnormal results are displayed) Labs Reviewed  CBC WITH DIFFERENTIAL/PLATELET - Abnormal; Notable for the following  components:      Result Value   RBC 3.32 (*)    Hemoglobin 8.0 (*)    HCT 26.0 (*)    MCV 78.3 (*)    MCH 24.1 (*)    RDW 21.0 (*)    Abs Immature Granulocytes 0.31 (*)    All other components within normal limits  COMPREHENSIVE METABOLIC PANEL - Abnormal; Notable for the following components:   Glucose, Bld 150 (*)    Calcium 7.5 (*)    Albumin 2.0 (*)    Alkaline Phosphatase 346 (*)    All other components within normal limits  URINALYSIS, ROUTINE W REFLEX MICROSCOPIC - Abnormal; Notable for the following components:   Ketones, ur 5 (*)    All other components within normal limits  RESPIRATORY PANEL BY RT PCR (FLU A&B, COVID)  MAGNESIUM  POC OCCULT BLOOD, ED    EKG EKG Interpretation  Date/Time:  Saturday June 18 2020 19:46:11 EDT Ventricular Rate:  74 PR Interval:    QRS Duration: 84 QT Interval:  429 QTC Calculation: 476 R Axis:   -175 Text Interpretation: Sinus rhythm Low voltage, extremity leads Posterior infarct, old Nonspecific T abnormalities, lateral leads Confirmed by Lacretia Leigh ([id number]) on 06/18/2020 10:13:35 PM   Radiology CT Head Wo Contrast  Result Date: 06/18/2020 CLINICAL DATA:  Weakness EXAM: CT HEAD WITHOUT CONTRAST TECHNIQUE: Contiguous axial images were  obtained from the base of the skull through the vertex without intravenous contrast. COMPARISON:  April 30, 2020 FINDINGS: Brain: No evidence of acute territorial infarction, hemorrhage, hydrocephalus,extra-axial collection or mass lesion/mass effect. There is dilatation the ventricles and sulci consistent with age-related atrophy. Low-attenuation changes in the deep white matter consistent with small vessel ischemia. Vascular: No hyperdense vessel or unexpected calcification. Skull: The skull is intact. No fracture or focal lesion identified. Sinuses/Orbits: The visualized paranasal sinuses and mastoid air cells are clear. The orbits and globes intact. Other: None IMPRESSION: No acute intracranial  abnormality. Findings consistent with age related atrophy and chronic small vessel ischemia Electronically Signed   By: Prudencio Pair M.D.   On: 06/18/2020 23:05   DG Chest Portable 1 View  Result Date: 06/18/2020 CLINICAL DATA:  Weakness. EXAM: PORTABLE CHEST 1 VIEW COMPARISON:  June 09, 2020 FINDINGS: Low lung volumes are seen which is likely secondary to the degree of patient inspiration. The heart size and mediastinal contours are within normal limits. Both lungs are clear. The visualized skeletal structures are unremarkable. IMPRESSION: No active cardiopulmonary disease. Electronically Signed   By: Virgina Norfolk M.D.   On: 06/18/2020 20:05    Procedures Procedures (including critical care time)  Medications Ordered in ED Medications  sodium chloride 0.9 % bolus 500 mL (0 mLs Intravenous Stopped 06/18/20 2222)    ED Course  I have reviewed the triage vital signs and the nursing notes.  Pertinent labs & imaging results that were available during my care of the patient were reviewed by me and considered in my medical decision making (see chart for details).    MDM Rules/Calculators/A&P                          Patient presenting for evaluation of dizziness, weakness, lightheadedness.  On exam, patient appears nontoxic.  Per EMS, patient was hypertensive on arrival, this improved with 400 cc of saline.  Patient recently has had issues with orthostatic hypotension.  He is on multiple antihypertensives which is likely complicating the picture.  Patient also reports decreased p.o. intake.  Will obtain labs, EKG, chest x-ray.  Will obtain CT head as patient is reporting dizziness/lightheadedness and he is on Xarelto.  Labs interpreted by me, overall reassuring.  Mild anemia of 8, similar to several months ago, however down 2 g from 9 days ago.  Patient does not report any GI bleed or known blood loss.  Will obtain Hemoccult.  EKG  without acute infarct.  CT head negative for acute  findings.  Chest x-ray viewed interpreted by me, no pneumonia pneumothorax or effusion.  Urine without infection.  Blood pressure remaining stable in the ED. Hemoccult negative. Case discussed with attending, Dr. Zenia Resides evaluated the pt.   Will obtain orthostatics and reassess.  Orthostatics show drop from 130/74-->79/58.  Patient was also symptomatic.  As he has a history of heart failure, and has already received 900 cc of fluid, he will likely need to be admitted for continued gentle fluids.  He may need management of his blood pressure medications.  Discussed with patient and daughter, they are agreeable to plan.  Will call for admission.  Discussed with Dr. Alcario Drought, pt to be admitted.   Final Clinical Impression(s) / ED Diagnoses Final diagnoses:  Orthostatic hypotension    Rx / DC Orders ED Discharge Orders    None       Franchot Heidelberg, PA-C 06/19/20 0043  Lacretia Leigh, MD 06/19/20 [phone]

## 2020-06-19 ENCOUNTER — Encounter (HOSPITAL_COMMUNITY): Payer: Self-pay | Admitting: Student

## 2020-06-19 ENCOUNTER — Other Ambulatory Visit: Payer: Self-pay

## 2020-06-19 DIAGNOSIS — E440 Moderate protein-calorie malnutrition: Secondary | ICD-10-CM

## 2020-06-19 DIAGNOSIS — E43 Unspecified severe protein-calorie malnutrition: Secondary | ICD-10-CM | POA: Diagnosis present

## 2020-06-19 DIAGNOSIS — C61 Malignant neoplasm of prostate: Secondary | ICD-10-CM

## 2020-06-19 DIAGNOSIS — C7951 Secondary malignant neoplasm of bone: Secondary | ICD-10-CM | POA: Diagnosis present

## 2020-06-19 DIAGNOSIS — I480 Paroxysmal atrial fibrillation: Secondary | ICD-10-CM

## 2020-06-19 DIAGNOSIS — I130 Hypertensive heart and chronic kidney disease with heart failure and stage 1 through stage 4 chronic kidney disease, or unspecified chronic kidney disease: Secondary | ICD-10-CM | POA: Diagnosis present

## 2020-06-19 DIAGNOSIS — E1165 Type 2 diabetes mellitus with hyperglycemia: Secondary | ICD-10-CM

## 2020-06-19 DIAGNOSIS — I951 Orthostatic hypotension: Principal | ICD-10-CM

## 2020-06-19 DIAGNOSIS — Z888 Allergy status to other drugs, medicaments and biological substances status: Secondary | ICD-10-CM | POA: Diagnosis not present

## 2020-06-19 DIAGNOSIS — E1142 Type 2 diabetes mellitus with diabetic polyneuropathy: Secondary | ICD-10-CM

## 2020-06-19 DIAGNOSIS — Z87442 Personal history of urinary calculi: Secondary | ICD-10-CM | POA: Diagnosis not present

## 2020-06-19 DIAGNOSIS — I5032 Chronic diastolic (congestive) heart failure: Secondary | ICD-10-CM | POA: Diagnosis present

## 2020-06-19 DIAGNOSIS — Z8673 Personal history of transient ischemic attack (TIA), and cerebral infarction without residual deficits: Secondary | ICD-10-CM

## 2020-06-19 DIAGNOSIS — E876 Hypokalemia: Secondary | ICD-10-CM | POA: Diagnosis not present

## 2020-06-19 DIAGNOSIS — I2510 Atherosclerotic heart disease of native coronary artery without angina pectoris: Secondary | ICD-10-CM | POA: Diagnosis present

## 2020-06-19 DIAGNOSIS — R197 Diarrhea, unspecified: Secondary | ICD-10-CM | POA: Diagnosis present

## 2020-06-19 DIAGNOSIS — Z23 Encounter for immunization: Secondary | ICD-10-CM | POA: Diagnosis present

## 2020-06-19 DIAGNOSIS — R5381 Other malaise: Secondary | ICD-10-CM

## 2020-06-19 DIAGNOSIS — Z823 Family history of stroke: Secondary | ICD-10-CM | POA: Diagnosis not present

## 2020-06-19 DIAGNOSIS — I10 Essential (primary) hypertension: Secondary | ICD-10-CM

## 2020-06-19 DIAGNOSIS — Z20822 Contact with and (suspected) exposure to covid-19: Secondary | ICD-10-CM | POA: Diagnosis present

## 2020-06-19 DIAGNOSIS — Z7901 Long term (current) use of anticoagulants: Secondary | ICD-10-CM | POA: Diagnosis not present

## 2020-06-19 DIAGNOSIS — I69322 Dysarthria following cerebral infarction: Secondary | ICD-10-CM | POA: Diagnosis not present

## 2020-06-19 DIAGNOSIS — I69354 Hemiplegia and hemiparesis following cerebral infarction affecting left non-dominant side: Secondary | ICD-10-CM | POA: Diagnosis not present

## 2020-06-19 DIAGNOSIS — Z6820 Body mass index (BMI) 20.0-20.9, adult: Secondary | ICD-10-CM | POA: Diagnosis not present

## 2020-06-19 DIAGNOSIS — Z85828 Personal history of other malignant neoplasm of skin: Secondary | ICD-10-CM | POA: Diagnosis not present

## 2020-06-19 DIAGNOSIS — E1122 Type 2 diabetes mellitus with diabetic chronic kidney disease: Secondary | ICD-10-CM | POA: Diagnosis present

## 2020-06-19 DIAGNOSIS — Z955 Presence of coronary angioplasty implant and graft: Secondary | ICD-10-CM | POA: Diagnosis not present

## 2020-06-19 LAB — CBC
HCT: 26.6 % — ABNORMAL LOW (ref 39.0–52.0)
Hemoglobin: 7.9 g/dL — ABNORMAL LOW (ref 13.0–17.0)
MCH: 23.7 pg — ABNORMAL LOW (ref 26.0–34.0)
MCHC: 29.7 g/dL — ABNORMAL LOW (ref 30.0–36.0)
MCV: 79.9 fL — ABNORMAL LOW (ref 80.0–100.0)
Platelets: 194 10*3/uL (ref 150–400)
RBC: 3.33 MIL/uL — ABNORMAL LOW (ref 4.22–5.81)
RDW: 21.1 % — ABNORMAL HIGH (ref 11.5–15.5)
WBC: 7.8 10*3/uL (ref 4.0–10.5)
nRBC: 0 % (ref 0.0–0.2)

## 2020-06-19 LAB — RETICULOCYTES
Immature Retic Fract: 21 % — ABNORMAL HIGH (ref 2.3–15.9)
RBC.: 3.27 MIL/uL — ABNORMAL LOW (ref 4.22–5.81)
Retic Count, Absolute: 43.5 10*3/uL (ref 19.0–186.0)
Retic Ct Pct: 1.3 % (ref 0.4–3.1)

## 2020-06-19 LAB — IRON AND TIBC
Iron: 40 ug/dL — ABNORMAL LOW (ref 45–182)
Saturation Ratios: 30 % (ref 17.9–39.5)
TIBC: 133 ug/dL — ABNORMAL LOW (ref 250–450)
UIBC: 93 ug/dL

## 2020-06-19 LAB — BASIC METABOLIC PANEL
Anion gap: 12 (ref 5–15)
BUN: 13 mg/dL (ref 8–23)
CO2: 21 mmol/L — ABNORMAL LOW (ref 22–32)
Calcium: 7.1 mg/dL — ABNORMAL LOW (ref 8.9–10.3)
Chloride: 107 mmol/L (ref 98–111)
Creatinine, Ser: 0.66 mg/dL (ref 0.61–1.24)
GFR calc Af Amer: >60 mL/min (ref >60)
GFR calc non Af Amer: >60 mL/min (ref >60)
Glucose, Bld: 108 mg/dL — ABNORMAL HIGH (ref 70–99)
Potassium: 3.3 mmol/L — ABNORMAL LOW (ref 3.5–5.1)
Sodium: 140 mmol/L (ref 135–145)

## 2020-06-19 LAB — POC OCCULT BLOOD, ED: Fecal Occult Bld: NEGATIVE

## 2020-06-19 LAB — MAGNESIUM: Magnesium: 1.9 mg/dL (ref 1.7–2.4)

## 2020-06-19 LAB — GLUCOSE, CAPILLARY
Glucose-Capillary: 97 mg/dL (ref 70–99)
Glucose-Capillary: 89 mg/dL (ref 70–99)

## 2020-06-19 LAB — VITAMIN B12: Vitamin B-12: 1048 pg/mL — ABNORMAL HIGH (ref 180–914)

## 2020-06-19 LAB — FOLATE: Folate: 15.3 ng/mL (ref >5.9)

## 2020-06-19 LAB — TSH: TSH: 4.671 u[IU]/mL — ABNORMAL HIGH (ref 0.350–4.500)

## 2020-06-19 LAB — FERRITIN: Ferritin: 1297 ng/mL — ABNORMAL HIGH (ref 24–336)

## 2020-06-19 LAB — CORTISOL-AM, BLOOD: Cortisol - AM: 17.7 ug/dL (ref 6.7–22.6)

## 2020-06-19 MED ORDER — LOPERAMIDE HCL 2 MG PO CAPS
2.0000 mg | ORAL_CAPSULE | ORAL | Status: DC | PRN
  Administered 2020-06-19: 2 mg via ORAL
  Filled 2020-06-19: qty 1

## 2020-06-19 MED ORDER — ACETAMINOPHEN 650 MG RE SUPP: 650.0000 mg | Freq: Four times a day (QID) | RECTAL | Status: DC | PRN

## 2020-06-19 MED ORDER — ACETAMINOPHEN 500 MG PO TABS
500.0000 mg | ORAL_TABLET | Freq: Three times a day (TID) | ORAL | Status: DC
  Administered 2020-06-19 – 2020-06-21 (×8): 500 mg via ORAL
  Filled 2020-06-19 (×8): qty 1

## 2020-06-19 MED ORDER — ENSURE ENLIVE PO LIQD
237.0000 mL | Freq: Two times a day (BID) | ORAL | Status: DC
  Administered 2020-06-19 – 2020-06-24 (×8): 237 mL via ORAL

## 2020-06-19 MED ORDER — TRAMADOL HCL 50 MG PO TABS: 50.0000 mg | ORAL_TABLET | Freq: Three times a day (TID) | ORAL | Status: DC | PRN

## 2020-06-19 MED ORDER — DOXAZOSIN MESYLATE 1 MG PO TABS
1.0000 mg | ORAL_TABLET | Freq: Every day | ORAL | Status: DC
  Administered 2020-06-19: 1 mg via ORAL
  Filled 2020-06-19 (×2): qty 1

## 2020-06-19 MED ORDER — DOCUSATE SODIUM 100 MG PO CAPS: 200.0000 mg | ORAL_CAPSULE | Freq: Two times a day (BID) | ORAL | Status: DC | PRN

## 2020-06-19 MED ORDER — METOPROLOL SUCCINATE ER 50 MG PO TB24
50.0000 mg | ORAL_TABLET | Freq: Every day | ORAL | Status: DC
  Administered 2020-06-19: 50 mg via ORAL
  Filled 2020-06-19: qty 1

## 2020-06-19 MED ORDER — CALCIUM CARBONATE 1250 (500 CA) MG PO TABS
1250.0000 mg | ORAL_TABLET | Freq: Every day | ORAL | Status: DC
  Administered 2020-06-19 – 2020-06-24 (×6): 1250 mg via ORAL
  Filled 2020-06-19 (×8): qty 1

## 2020-06-19 MED ORDER — METOPROLOL TARTRATE 12.5 MG HALF TABLET: 12.5000 mg | ORAL_TABLET | Freq: Two times a day (BID) | ORAL | Status: DC

## 2020-06-19 MED ORDER — METOPROLOL SUCCINATE ER 25 MG PO TB24
25.0000 mg | ORAL_TABLET | Freq: Every day | ORAL | Status: DC
  Administered 2020-06-20: 25 mg via ORAL
  Filled 2020-06-19: qty 1

## 2020-06-19 MED ORDER — ONDANSETRON HCL 4 MG PO TABS: 4.0000 mg | ORAL_TABLET | Freq: Four times a day (QID) | ORAL | Status: DC | PRN

## 2020-06-19 MED ORDER — RAMIPRIL 2.5 MG PO CAPS
2.5000 mg | ORAL_CAPSULE | Freq: Every day | ORAL | Status: DC
  Administered 2020-06-19: 2.5 mg via ORAL
  Filled 2020-06-19: qty 1

## 2020-06-19 MED ORDER — POTASSIUM CHLORIDE CRYS ER 20 MEQ PO TBCR
40.0000 meq | EXTENDED_RELEASE_TABLET | ORAL | Status: AC
  Administered 2020-06-19 (×2): 40 meq via ORAL
  Filled 2020-06-19 (×2): qty 2

## 2020-06-19 MED ORDER — GLIMEPIRIDE 2 MG PO TABS
2.0000 mg | ORAL_TABLET | Freq: Every day | ORAL | Status: DC
  Administered 2020-06-19: 2 mg via ORAL
  Filled 2020-06-19: qty 1

## 2020-06-19 MED ORDER — ENZALUTAMIDE 80 MG PO TABS: 80.0000 mg | ORAL_TABLET | Freq: Every day | ORAL | Status: DC

## 2020-06-19 MED ORDER — TRAMADOL HCL 50 MG PO TABS
50.0000 mg | ORAL_TABLET | Freq: Two times a day (BID) | ORAL | Status: DC | PRN
  Administered 2020-06-21 – 2020-06-23 (×3): 50 mg via ORAL
  Filled 2020-06-19 (×3): qty 1

## 2020-06-19 MED ORDER — RIVAROXABAN 10 MG PO TABS
20.0000 mg | ORAL_TABLET | Freq: Every day | ORAL | Status: DC
  Administered 2020-06-19 – 2020-06-23 (×5): 20 mg via ORAL
  Filled 2020-06-19 (×5): qty 2

## 2020-06-19 MED ORDER — ATORVASTATIN CALCIUM 20 MG PO TABS
20.0000 mg | ORAL_TABLET | Freq: Every day | ORAL | Status: DC
  Administered 2020-06-19 – 2020-06-23 (×5): 20 mg via ORAL
  Filled 2020-06-19 (×5): qty 1

## 2020-06-19 MED ORDER — ACETAMINOPHEN 325 MG PO TABS: 650.0000 mg | ORAL_TABLET | Freq: Four times a day (QID) | ORAL | Status: DC | PRN

## 2020-06-19 MED ORDER — POLYETHYLENE GLYCOL 3350 17 G PO PACK
17.0000 g | PACK | Freq: Every day | ORAL | Status: DC
  Filled 2020-06-19: qty 1

## 2020-06-19 MED ORDER — ADULT MULTIVITAMIN W/MINERALS CH
1.0000 | ORAL_TABLET | Freq: Every day | ORAL | Status: DC
  Administered 2020-06-19 – 2020-06-24 (×6): 1 via ORAL
  Filled 2020-06-19 (×6): qty 1

## 2020-06-19 MED ORDER — LACTATED RINGERS IV SOLN: INTRAVENOUS | Status: AC

## 2020-06-19 MED ORDER — INFLUENZA VAC A&B SA ADJ QUAD 0.5 ML IM PRSY
0.5000 mL | PREFILLED_SYRINGE | INTRAMUSCULAR | Status: AC
  Administered 2020-06-20: 0.5 mL via INTRAMUSCULAR
  Filled 2020-06-19: qty 0.5

## 2020-06-19 MED ORDER — PRO-STAT SUGAR FREE PO LIQD
30.0000 mL | Freq: Two times a day (BID) | ORAL | Status: DC
  Administered 2020-06-19 – 2020-06-24 (×11): 30 mL via ORAL
  Filled 2020-06-19 (×11): qty 30

## 2020-06-19 MED ORDER — INSULIN ASPART 100 UNIT/ML ~~LOC~~ SOLN: 0.0000 [IU] | Freq: Every day | SUBCUTANEOUS | Status: DC

## 2020-06-19 MED ORDER — INSULIN ASPART 100 UNIT/ML ~~LOC~~ SOLN
0.0000 [IU] | Freq: Three times a day (TID) | SUBCUTANEOUS | Status: DC
  Administered 2020-06-20: 1 [IU] via SUBCUTANEOUS
  Administered 2020-06-21: 3 [IU] via SUBCUTANEOUS
  Administered 2020-06-21 – 2020-06-22 (×2): 2 [IU] via SUBCUTANEOUS
  Administered 2020-06-22: 1 [IU] via SUBCUTANEOUS
  Administered 2020-06-22 – 2020-06-23 (×3): 2 [IU] via SUBCUTANEOUS
  Administered 2020-06-24: 1 [IU] via SUBCUTANEOUS
  Administered 2020-06-24: 3 [IU] via SUBCUTANEOUS

## 2020-06-19 MED ORDER — ONDANSETRON HCL 4 MG/2ML IJ SOLN: 4.0000 mg | Freq: Four times a day (QID) | INTRAMUSCULAR | Status: DC | PRN

## 2020-06-19 NOTE — ED Notes (Signed)
Pt unable to stand at this time. Pt assisted at max to side of bed. Pt assisted to stand multiple times but could not perform. Pt repositioned back in bed with two techs assistance.

## 2020-06-19 NOTE — H&P (Addendum)
History and Physical    Adrian Singh TTS:[id number] DOB: 04-19-1934 DOA: 06/18/2020  PCP: [name], MD  Patient coming from: SNF  I have personally briefly reviewed patient's old medical records in [place]  Chief Complaint: Orthostatic hypotension  HPI: Adrian Singh is a 86 y.o. male with medical history significant of CHF, DM2, prostate CA with mets to bone, A.Fib on Xarelto.  Pt recently admitted to hospital for orthostatic hypotension from 9/23-9/27.  Treated with IVF and holding lasix and flomax that visit.  Flomax restarted at [facility]  Pt returns to ED today with recurrent orthostatic hypotension, pre-syncope with standing.   ED Course: BP continues to drop on standing despite 900bolus in ED.  I recd that EDP consider stopping Flomax as noted in DC summary, however EDP doesn't feel comfortable discharging patient and so wants admit.  Flomax last given at 0900 06/18/20 at SNF according to pt med-rec at bedside (showed to family member present).   Review of Systems: As per HPI, otherwise all review of systems negative.  Past Medical History:  Diagnosis Date  . Arthritis    "legs, back" (10/06/2015)  . Atrial fibrillation ([place])    x1  . CAD (coronary artery disease)    nonobstructive  . Cancer ([place])    skin cancer on ear (froze it off) and back (cut it off)  . Dysrhythmia   . Esophageal reflux    hx of  . History of kidney stones   . Hx of heart artery stent   . Hypertrophy of prostate without urinary obstruction and other lower urinary tract symptoms (LUTS)   . Osteoarthrosis, unspecified whether generalized or localized, unspecified site   . Prostate cancer ([place])   . Skin cancer   . Stroke [facility]) 03/2014   "had a series of mini strokes; maybe 4"; denies residual on 10/06/2015  . Thrombocytopenia ([place])   . Type II diabetes mellitus ([place])    type 2  . Unspecified essential hypertension     Past Surgical History:  Procedure Laterality Date  .  BACK SURGERY     2001  . CARDIAC CATHETERIZATION  12/2007  . CARDIAC CATHETERIZATION N/A 09/22/2015   Procedure: Left Heart Cath and Coronary Angiography;  Surgeon: Peter M Martinique, MD;  Location: [place] CV LAB;  Service: Cardiovascular;  Laterality: N/A;  . CARDIAC CATHETERIZATION N/A 10/06/2015   Procedure: Coronary Stent Intervention;  Surgeon: Peter M Martinique, MD;  Location: [place] CV LAB;  Service: Cardiovascular;  Laterality: N/A;  . CARDIAC CATHETERIZATION N/A 02/29/2016   Procedure: Left Heart Cath and Coronary Angiography;  Surgeon: Jettie Booze, MD;  Location: [place] CV LAB;  Service: Cardiovascular;  Laterality: N/A;  . COLONOSCOPY W/ BIOPSIES AND POLYPECTOMY    . CORONARY STENT PLACEMENT  10/06/2015   [name]  with DES  . EAR CYST EXCISION N/A 04/06/2015   Procedure: EXCISION OF SCALP CYST;  Surgeon: Donnie Mesa, MD;  Location: [place];  Service: General;  Laterality: N/A;  . ESOPHAGOGASTRODUODENOSCOPY (EGD) WITH ESOPHAGEAL DILATION  2001   with dilation  . LUMBAR DISC SURGERY  09/26/1999   "cleaned out arthritis and bone spurs"  . SHOULDER ARTHROSCOPY W/ ROTATOR CUFF REPAIR Left 2005  . THULIUM LASER TURP (TRANSURETHRAL RESECTION OF PROSTATE) N/A 01/21/2017   Procedure: Marcelino Duster LASER TURP (TRANSURETHRAL RESECTION OF PROSTATE) CAUTERIZATION OF BLADDER LESION;  Surgeon: Franchot Gallo, MD;  Location: [facility];  Service: Urology;  Laterality: N/A;     reports  that he has quit smoking. His smoking use included cigarettes. He quit after 3.00 years of use. He has never used smokeless tobacco. He reports that he does not drink alcohol and does not use drugs.  Allergies  Allergen Reactions  . Soma [Carisoprodol] Other (See Comments)    "did a number on me"  . Doxazosin Other (See Comments)    dizziness    Family History  Problem Relation Age of Onset  . Stroke Father   . Peripheral vascular disease Father        amputation  . Heart failure Mother        CHF  .  Coronary artery disease Mother   . Heart attack Mother 62       Multiple  . COPD Brother   . Heart disease Brother   . Cancer Brother        Bone  . Lung cancer Sister      Prior to Admission medications   Medication Sig Start Date End Date Taking? Authorizing Provider  Amino Acids-Protein Hydrolys (FEEDING SUPPLEMENT, PRO-STAT SUGAR FREE 64,) LIQD Take 30 mLs by mouth in the morning and at bedtime.   Yes [provider]  Ascorbic Acid (VITAMIN C) 1000 MG tablet Take 1,000 mg by mouth daily.   Yes [provider]  atorvastatin (LIPITOR) 20 MG tablet Take 20 mg by mouth at bedtime.    Yes [provider]  B Complex Vitamins (VITAMIN B COMPLEX PO) Take 1 tablet by mouth daily.   Yes [provider]  calcium carbonate (CALCIUM 600) 1500 (600 Ca) MG TABS tablet Take 600 mg of elemental calcium by mouth daily.    Yes [provider]  Cholecalciferol (VITAMIN D3) 1.25 MG (50000 UT) CAPS Take 50,000 Units by mouth once a week. Saturday   Yes [provider]  cyanocobalamin 1000 MCG tablet Take 1,000 mg by mouth at bedtime.    Yes [provider]  docusate sodium (COLACE) 100 MG capsule Take 200 mg by mouth 2 (two) times daily as needed for mild constipation.   Yes [provider]  doxazosin (CARDURA) 1 MG tablet Take 1 tablet (1 mg total) by mouth at bedtime. 05/12/20  Yes Charlynne Cousins, MD  enzalutamide Gillermina Phy) 80 MG tablet Take 80 mg by mouth daily.   Yes [provider]  glimepiride (AMARYL) 2 MG tablet Take 1 tablet (2 mg total) by mouth every morning. Patient taking differently: Take 2 mg by mouth daily.  06/13/20 06/13/21 Yes Georgette Shell, MD  metoprolol succinate (TOPROL-XL) 50 MG 24 hr tablet Take 1 tablet (50 mg total) by mouth daily. Take with or immediately following a meal. Patient taking differently: Take 50 mg by mouth daily.  06/14/20  Yes Georgette Shell, MD  Multiple Vitamin  (MULTIVITAMIN WITH MINERALS) TABS tablet Take 1 tablet by mouth daily.    Yes [provider]  polyethylene glycol (MIRALAX / GLYCOLAX) 17 g packet Take 17 g by mouth daily.   Yes [provider]  ramipril (ALTACE) 2.5 MG capsule Take 1 capsule (2.5 mg total) by mouth daily. 06/14/20  Yes Georgette Shell, MD  rivaroxaban (XARELTO) 20 MG TABS tablet Take 1 tablet (20 mg total) by mouth daily with supper. 01/21/20  Yes Martinique, Peter M, MD  traMADol (ULTRAM) 50 MG tablet Take 1 tablet (50 mg total) by mouth 3 (three) times daily as needed. Patient taking differently: Take 50 mg by mouth 3 (three) times  daily as needed for moderate pain.  06/13/20  Yes Georgette Shell, MD  acetaminophen (TYLENOL) 650 MG CR tablet Take 650 mg by mouth 2 (two) times daily as needed for pain.    [provider]  BAYER MICROLET LANCETS lancets Use to test blood sugar once daily dx: E11.40 10/18/15   [name], MD  Blood Glucose Monitoring Suppl (CONTOUR NEXT EZ MONITOR) w/Device KIT USE TO TEST BLOOD SUGAR ONCE DAILY. Dx Code E11.40 06/13/16   [name], MD  furosemide (LASIX) 40 MG tablet Take 1 tablet (40 mg total) by mouth daily as needed. Patient taking differently: Take 40 mg by mouth daily as needed for fluid.  05/15/20   Charlynne Cousins, MD  glucose blood (CONTOUR NEXT TEST) test strip Check blood sugar once daily and as directed.Dx Code E11.40 03/19/19   [name], MD  nitroGLYCERIN (NITROSTAT) 0.4 MG SL tablet Place 1 tablet (0.4 mg total) under the tongue every 5 (five) minutes as needed for chest pain. Patient taking differently: Place 0.4 mg under the tongue every 5 (five) minutes as needed for chest pain. If no relief, call MD 05/15/18   [name], MD    Physical Exam: Vitals:   06/18/20 2009 06/18/20 2023 06/18/20 2145 06/18/20 2330  BP: 125/61 135/61 (!) 155/71 (!) 144/68  Pulse: 71 70 72 62  Resp: (!) '21 12 12 15  ' Temp: 98 F (36.7 C) 98  F (36.7 C)    TempSrc: Oral Oral    SpO2: 98% 97% 96% 97%  Weight:  65.8 kg    Height:  '5\' 10"'  (1.778 m)      Constitutional: NAD, calm, comfortable Eyes: PERRL, lids and conjunctivae normal ENMT: Mucous membranes are moist. Posterior pharynx clear of any exudate or lesions.Normal dentition.  Neck: normal, supple, no masses, no thyromegaly Respiratory: clear to auscultation bilaterally, no wheezing, no crackles. Normal respiratory effort. No accessory muscle use.  Cardiovascular: Regular rate and rhythm, no murmurs / rubs / gallops. No extremity edema. 2+ pedal pulses. No carotid bruits.  Abdomen: no tenderness, no masses palpated. No hepatosplenomegaly. Bowel sounds positive.  Musculoskeletal: no clubbing / cyanosis. No joint deformity upper and lower extremities. Good ROM, no contractures. Normal muscle tone.  Skin: no rashes, lesions, ulcers. No induration Neurologic: CN 2-12 grossly intact. Sensation intact, DTR normal. Strength 5/5 in all 4.  Psychiatric: Normal judgment and insight. Alert and oriented x 3. Normal mood.    Labs on Admission: I have personally reviewed following labs and imaging studies  CBC: Recent Labs  Lab 06/18/20 1942  WBC 9.2  NEUTROABS 7.3  HGB 8.0*  HCT 26.0*  MCV 78.3*  PLT 601   Basic Metabolic Panel: Recent Labs  Lab 06/12/20 0724 06/18/20 1942  NA 138 140  K 3.4* 3.6  CL 102 106  CO2 27 22  GLUCOSE 139* 150*  BUN 9 13  CREATININE 0.62 0.76  CALCIUM 7.6* 7.5*  MG 1.5* 2.0   GFR: Estimated Creatinine Clearance: 65.1 mL/min (by C-G formula based on SCr of 0.76 mg/dL). Liver Function Tests: Recent Labs  Lab 06/18/20 1942  AST 38  ALT 21  ALKPHOS 346*  BILITOT 0.9  PROT 6.5  ALBUMIN 2.0*   No results for input(s): LIPASE, AMYLASE in the last 168 hours. No results for input(s): AMMONIA in the last 168 hours. Coagulation Profile: No results for input(s): INR, PROTIME in the last 168 hours. Cardiac Enzymes: No results for  input(s): CKTOTAL, CKMB, CKMBINDEX, TROPONINI in the last 168 hours. BNP (last 3 results) No results for input(s): PROBNP in the last 8760 hours. HbA1C: No results for input(s): HGBA1C in the last 72 hours. CBG: Recent Labs  Lab 06/13/20 1127  GLUCAP 201*   Lipid Profile: No results for input(s): CHOL, HDL, LDLCALC, TRIG, CHOLHDL, LDLDIRECT in the last 72 hours. Thyroid Function Tests: No results for input(s): TSH, T4TOTAL, FREET4, T3FREE, THYROIDAB in the last 72 hours. Anemia Panel: No results for input(s): VITAMINB12, FOLATE, FERRITIN, TIBC, IRON, RETICCTPCT in the last 72 hours. Urine analysis:    Component Value Date/Time   COLORURINE YELLOW 06/18/2020 2200   APPEARANCEUR CLEAR 06/18/2020 2200   LABSPEC 1.009 06/18/2020 2200   PHURINE 7.0 06/18/2020 2200   GLUCOSEU NEGATIVE 06/18/2020 2200   HGBUR NEGATIVE 06/18/2020 2200   BILIRUBINUR NEGATIVE 06/18/2020 2200   BILIRUBINUR negative 03/18/2014 1014   KETONESUR 5 (A) 06/18/2020 2200   PROTEINUR NEGATIVE 06/18/2020 2200   UROBILINOGEN 1.0 03/31/2014 1228   NITRITE NEGATIVE 06/18/2020 2200   LEUKOCYTESUR NEGATIVE 06/18/2020 2200    Radiological Exams on Admission: CT Head Wo Contrast  Result Date: 06/18/2020 CLINICAL DATA:  Weakness EXAM: CT HEAD WITHOUT CONTRAST TECHNIQUE: Contiguous axial images were obtained from the base of the skull through the vertex without intravenous contrast. COMPARISON:  April 30, 2020 FINDINGS: Brain: No evidence of acute territorial infarction, hemorrhage, hydrocephalus,extra-axial collection or mass lesion/mass effect. There is dilatation the ventricles and sulci consistent with age-related atrophy. Low-attenuation changes in the deep white matter consistent with small vessel ischemia. Vascular: No hyperdense vessel or unexpected calcification. Skull: The skull is intact. No fracture or focal lesion identified. Sinuses/Orbits: The visualized paranasal sinuses and mastoid air cells are clear. The  orbits and globes intact. Other: None IMPRESSION: No acute intracranial abnormality. Findings consistent with age related atrophy and chronic small vessel ischemia Electronically Signed   By: Prudencio Pair M.D.   On: 06/18/2020 23:05   DG Chest Portable 1 View  Result Date: 06/18/2020 CLINICAL DATA:  Weakness. EXAM: PORTABLE CHEST 1 VIEW COMPARISON:  June 09, 2020 FINDINGS: Low lung volumes are seen which is likely secondary to the degree of patient inspiration. The heart size and mediastinal contours are within normal limits. Both lungs are clear. The visualized skeletal structures are unremarkable. IMPRESSION: No active cardiopulmonary disease. Electronically Signed   By: Virgina Norfolk M.D.   On: 06/18/2020 20:05    EKG: Independently reviewed.  Assessment/Plan Principal Problem:   Orthostatic hypotension Active Problems:   Essential hypertension   Uncontrolled type 2 diabetes mellitus with diabetic polyneuropathy, without long-term current use of insulin (HCC)   Primary malignant neoplasm of prostate metastatic to bone ([place])    1. Orthostatic hypotension - 1. 900cc bolus in ED 2. Hold lasix 3. Stop flomax - I discontinued this med from med-rec here, was last given 10/2 at 0900 at SNF 2. HTN - 1. Cont ACEi, BB, cardura 2. BP [id number] systolic laying down, will just have to tolerate higher BP due to severe orthostasis 3. DM2 - 1. Cont home DM meds 4. Metastatic prostate CA - Cont Xtandi 5. A.Fib - 1. Cont BB 2. Cont Xarelto 6. Anemia - 1. Chronic 2. Hemoccult neg 3. HGB 8 today is slightly lower than DC but better than Aug.  DVT prophylaxis: Xarelto Code Status: Full Family Communication: Daughter at bedside Disposition Plan: SNF after orthostasis improved Consults called: None Admission status: Place in [place], Ruhaan Nordahl  M. DO [facility]  How to contact the [facility] Attending or Consulting provider [place] or covering provider during after hours [place], for this patient?  1. Check the care team in [facility] and look for a) attending/consulting [facility] provider listed and b) the [facility] team listed 2. Log into [email or web address]  Amion Physician Scheduling and messaging for groups and whole hospitals  On call and physician scheduling software for group practices, residents, hospitalists and other medical providers for call, clinic, rotation and shift schedules. OnCall [employer] is a hospital-wide system for scheduling doctors and paging doctors on call. EasyPlot is for scientific plotting and data analysis.  [email or web address]  and use [place]'s universal password to access. If you do not have the password, please contact the hospital operator.  3. Locate the [facility] provider you are looking for under [facility] and page to a number that you can be directly reached. 4. If you still have difficulty reaching the provider, please page the [facility] (Director on Call) for the Hospitalists listed on amion for assistance.  06/19/2020, 12:55 AM

## 2020-06-19 NOTE — Progress Notes (Signed)
Physical Therapy Treatment Patient Details Name: Adrian Singh MRN: [id number] DOB: 1934/09/07 Today's Date: 06/19/2020    History of Present Illness 86 year old male with past medical history of coronary artery disease, paroxysmal atrial fibrillation on Xarelto, noninsulin-dependent diabetes mellitus type 2, recent acute stroke 04/2020, hypertension, chronic kidney disease stage IIIa and metastatic prostate cancer to bone who presents to [facility] emergency department with complaints of severe lightheadedness and weakness.  Pt admitted for orthostatic hypotension     PT Comments    Pt continues very cooperative but ltd by symptomatic orthostatic hypotension with attempts to mobilize.  Pt up in the recliner for short period only as agreed upon but found to be slowly sliding out.  Pt assisted to upright sitting, standing and then ambulated short distance to return to bed - pt with c/o increasing dizziness with standing.  Follow Up Recommendations  SNF     Equipment Recommendations  None recommended by PT    Recommendations for Other Services OT consult     Precautions / Restrictions Precautions Precautions: Fall Precaution Comments: orthostatic hypotension Restrictions Weight Bearing Restrictions: No    Mobility  Bed Mobility Overal bed mobility: Needs Assistance Bed Mobility: Sit to Supine     Supine to sit: Mod assist Sit to supine: Min assist;Mod assist;+2 for physical assistance;+2 for safety/equipment   General bed mobility comments: Assist to manage LEs onto bed and to control trunk  Transfers Overall transfer level: Needs assistance Equipment used: Rolling walker (2 wheeled) Transfers: Sit to/from Stand Sit to Stand: Min assist;+2 physical assistance;+2 safety/equipment Stand pivot transfers: Min assist;+2 physical assistance;+2 safety/equipment;From elevated surface       General transfer comment:  Assist to power up, stabilize, control descent.   Cues for safety, technqiue, hand/feet placement.  Ambulation/Gait Ambulation/Gait assistance: Min assist;+2 safety/equipment Gait Distance (Feet): 3 Feet Assistive device: Rolling walker (2 wheeled) Gait Pattern/deviations: Step-to pattern Gait velocity: decr   General Gait Details: Assist to steady pt throughout distance - distance ltd by increasing dizziness Cues for safety, technique, posture, sequence. Pt tends to maintain a flexed trunk and knee posture once ambulating.   Stairs             Wheelchair Mobility    Modified Rankin (Stroke Patients Only)       Balance Overall balance assessment: Needs assistance;History of Falls Sitting-balance support: No upper extremity supported;Feet supported Sitting balance-Leahy Scale: Fair     Standing balance support: Bilateral upper extremity supported Standing balance-Leahy Scale: Poor                              Cognition Arousal/Alertness: Awake/alert Behavior During Therapy: WFL for tasks assessed/performed Overall Cognitive Status: Within Functional Limits for tasks assessed                                 General Comments: very pleasant and cooperative      Exercises General Exercises - Lower Extremity Ankle Circles/Pumps: AROM;Both;Supine;20 reps    General Comments        Pertinent Vitals/Pain Pain Assessment: No/denies pain    Home Living Family/patient expects to be discharged to:: Unsure Living Arrangements: Spouse/significant other Available Help at Discharge: Family;Available PRN/intermittently Type of Home: House Home Access: Stairs to enter Entrance Stairs-Rails: None Home Layout: One level Home Equipment: Environmental consultant - 2 wheels;Cane - single point;Cane - quad;Bedside commode;Shower seat;Hand  held shower head;Wheelchair - manual;Grab bars - toilet Additional Comments: Pt from [facility] (for rehab) but not doing well and not eating or drinking    Prior Function  Level of Independence: Needs assistance  Gait / Transfers Assistance Needed: Pt states not getting up much last few weeks   Comments: Pt at [facility] but not progressing well and not eating or drinking sufficiently   PT Goals (current goals can now be found in the care plan section) Acute Rehab PT Goals Patient Stated Goal: Regain IND PT Goal Formulation: With patient Time For Goal Achievement: 07/03/20 Potential to Achieve Goals: Fair Progress towards PT goals: Progressing toward goals    Frequency    Min 3X/week      PT Plan Current plan remains appropriate    Co-evaluation              AM-PAC PT "6 Clicks" Mobility   Outcome Measure  Help needed turning from your back to your side while in a flat bed without using bedrails?: A Little Help needed moving from lying on your back to sitting on the side of a flat bed without using bedrails?: A Lot Help needed moving to and from a bed to a chair (including a wheelchair)?: A Lot Help needed standing up from a chair using your arms (e.g., wheelchair or bedside chair)?: A Lot Help needed to walk in hospital room?: A Lot Help needed climbing 3-5 steps with a railing? : Total 6 Click Score: 12    End of Session Equipment Utilized During Treatment: Gait belt Activity Tolerance: Patient limited by fatigue;Other (comment) (orthostatic) Patient left: in bed;with call bell/phone within reach;with bed alarm set;with family/visitor present Nurse Communication: Mobility status PT Visit Diagnosis: History of falling (Z91.81);Muscle weakness (generalized) (M62.81);Unsteadiness on feet (R26.81);Difficulty in walking, not elsewhere classified (R26.2)     Time: 9390-3009 PT Time Calculation (min) (ACUTE ONLY): 16 min  Charges:  $Gait Training: 8-22 mins $Therapeutic Activity: 8-22 mins                     [place] Pager [phone] Office  [phone]    Kammi Hechler 06/19/2020, 3:45 PM

## 2020-06-19 NOTE — Progress Notes (Signed)
PHARMACY   Patient's home med Xtandi 80mg  daily ordered to continue upon admission  Enzalutamide Gillermina Phy) Hold Criteria: . Seizures . Hgb < 8 . WBC < 2000 . ANC < 1000 . Pltc < 50K . (Screen for drug interactions)  06/19/20 @ 0500 Hgb = 7.9  Plan:  Hold Xtandi per protocol as Hgb < 8           Will follow for appropriateness of resuming medication  Leone Haven, PharmD

## 2020-06-19 NOTE — Evaluation (Signed)
Physical Therapy Evaluation Patient Details Name: Adrian Singh MRN: [id number] DOB: October 09, 1933 Today's Date: 06/19/2020   History of Present Illness  86 year old male with past medical history of coronary artery disease, paroxysmal atrial fibrillation on Xarelto, noninsulin-dependent diabetes mellitus type 2, recent acute stroke 04/2020, hypertension, chronic kidney disease stage IIIa and metastatic prostate cancer to bone who presents to [facility] emergency department with complaints of severe lightheadedness and weakness.  Pt admitted for orthostatic hypotension   Clinical Impression  Pt admitted as above and presenting with functional mobility limitations 2* generalized weakness, balance deficits, poor endurance and orthostatic hypotension.  Pt is very cooperative but limited by increasing dizziness with attempts to mobilize.  BP supine 121/58; sit 97/65; stand 80/48; after move to recliner 76/50.  Pt has been at [facility] but per dtr is not doing well and not eating or drinking much.  But dtr states, pt's spouse can not take care of him at his current level.    Follow Up Recommendations SNF    Equipment Recommendations  None recommended by PT    Recommendations for Other Services OT consult     Precautions / Restrictions Precautions Precautions: Fall Precaution Comments: orthostatic hypotension Restrictions Weight Bearing Restrictions: No      Mobility  Bed Mobility Overal bed mobility: Needs Assistance Bed Mobility: Supine to Sit     Supine to sit: Mod assist     General bed mobility comments: Assist for trunk and to complete transition to EOB sitting.. Increased time.   Transfers Overall transfer level: Needs assistance Equipment used: Rolling walker (2 wheeled) Transfers: Sit to/from Stand Sit to Stand: Min assist;+2 physical assistance;+2 safety/equipment;From elevated surface Stand pivot transfers: Min assist;+2 physical assistance;+2  safety/equipment;From elevated surface       General transfer comment: x2. Assist to power up, stabilize, control descent.  Cues for safety, technqiue, hand/feet placement.  Ambulation/Gait Ambulation/Gait assistance: Min assist;+2 safety/equipment Gait Distance (Feet): 4 Feet Assistive device: Rolling walker (2 wheeled) Gait Pattern/deviations: Step-to pattern Gait velocity: decr   General Gait Details: Assist to steady pt throughout distance. Cues for safety, technique, posture, sequence. Pt tends to maintain a flexed trunk and knee posture once ambulating.  Stairs            Wheelchair Mobility    Modified Rankin (Stroke Patients Only)       Balance Overall balance assessment: Needs assistance;History of Falls Sitting-balance support: No upper extremity supported;Feet supported Sitting balance-Leahy Scale: Fair     Standing balance support: Bilateral upper extremity supported Standing balance-Leahy Scale: Poor                               Pertinent Vitals/Pain Pain Assessment: No/denies pain    Home Living Family/patient expects to be discharged to:: Unsure Living Arrangements: Spouse/significant other Available Help at Discharge: Family;Available PRN/intermittently Type of Home: House Home Access: Stairs to enter Entrance Stairs-Rails: None Entrance Stairs-Number of Steps: 3 Home Layout: One level Home Equipment: Walker - 2 wheels;Cane - single point;Cane - quad;Bedside commode;Shower seat;Hand held shower head;Wheelchair - manual;Grab bars - toilet Additional Comments: Pt from [facility] (for rehab) but not doing well and not eating or drinking    Prior Function Level of Independence: Needs assistance   Gait / Transfers Assistance Needed: Pt states not getting up much last few weeks     Comments: Pt at [facility] but not progressing well and not eating  or drinking sufficiently     Hand Dominance   Dominant Hand: Right     Extremity/Trunk Assessment   Upper Extremity Assessment Upper Extremity Assessment: Generalized weakness    Lower Extremity Assessment Lower Extremity Assessment: Generalized weakness    Cervical / Trunk Assessment Cervical / Trunk Assessment: Kyphotic  Communication   Communication: No difficulties  Cognition Arousal/Alertness: Awake/alert Behavior During Therapy: WFL for tasks assessed/performed Overall Cognitive Status: Within Functional Limits for tasks assessed                                 General Comments: very pleasant and cooperative      General Comments      Exercises General Exercises - Lower Extremity Ankle Circles/Pumps: AROM;Both;Supine;20 reps   Assessment/Plan    PT Assessment Patient needs continued PT services  PT Problem List Decreased strength;Decreased mobility;Decreased balance;Decreased activity tolerance       PT Treatment Interventions DME instruction;Gait training;Balance training;Therapeutic exercise;Functional mobility training;Therapeutic activities;Patient/family education;Wheelchair mobility training    PT Goals (Current goals can be found in the Care Plan section)  Acute Rehab PT Goals Patient Stated Goal: Regain IND PT Goal Formulation: With patient Time For Goal Achievement: 07/03/20 Potential to Achieve Goals: Fair    Frequency Min 3X/week   Barriers to discharge        Co-evaluation               AM-PAC PT "6 Clicks" Mobility  Outcome Measure Help needed turning from your back to your side while in a flat bed without using bedrails?: A Little Help needed moving from lying on your back to sitting on the side of a flat bed without using bedrails?: A Lot Help needed moving to and from a bed to a chair (including a wheelchair)?: A Lot Help needed standing up from a chair using your arms (e.g., wheelchair or bedside chair)?: A Lot Help needed to walk in hospital room?: A Lot Help needed climbing 3-5  steps with a railing? : Total 6 Click Score: 12    End of Session Equipment Utilized During Treatment: Gait belt Activity Tolerance: Patient limited by fatigue;Other (comment) (orthostatic) Patient left: in chair;with call bell/phone within reach;with chair alarm set Nurse Communication: Mobility status PT Visit Diagnosis: History of falling (Z91.81);Muscle weakness (generalized) (M62.81);Unsteadiness on feet (R26.81);Difficulty in walking, not elsewhere classified (R26.2)    Time: 7209-4709 PT Time Calculation (min) (ACUTE ONLY): 38 min   Charges:   PT Evaluation $PT Eval Low Complexity: 1 Low PT Treatments $Therapeutic Activity: 8-22 mins        Debe Coder PT Acute Rehabilitation Services Pager [phone] Office [phone]   Monserrate Blaschke 06/19/2020, 3:10 PM

## 2020-06-19 NOTE — Progress Notes (Signed)
PROGRESS NOTE  Adrian Singh JGG:[id number] DOB: 02/16/1934   PCP: [name], MD  Patient is from: SNF.  Reports using walker at baseline.  DOA: 06/18/2020 LOS: 0  Brief Narrative / Interim history: 86 year old male with history of diastolic CHF, DM-2, metastatic prostate cancer, A. fib on Xarelto, CVA with residual dysarthria, nonobstructive CAD, debility, and recent hospitalization 9/23-9/27 for orthostatic hypotension returning with presyncope and orthostatic hypotension.  At that time, it was felt to be due to diuretics, Cardura and poor p.o. intake but the medications were resumed on discharge to SNF. Per review of MAR from NH, Flomax was added, and patient has been getting Flomax and Cardura.   In ED, orthostatic vitals remained positive despite bolus IV fluids.  The decision was made to admit for observation.  CMP without significant finding.  Hgb 8.0 (about baseline).  Subjective: Seen and examined earlier this morning.  Reports feeling better lying down.  He did not get up yet.  Denies chest pain, shortness of breath, GI symptoms or focal neuro symptoms other than bilateral neuropathic pain in his feet.  Has chronic hesitancy with urination.  Also reports poor p.o. intake.  Confirms full code.  Objective: Vitals:   06/19/20 0800 06/19/20 1000 06/19/20 1100 06/19/20 1302  BP: (!) 158/58 (!) 120/101 (!) 149/70 133/68  Pulse: 82 85 73 68  Resp: 14 16 17    Temp:    98.4 F (36.9 C)  TempSrc:    Oral  SpO2: 96% 97% 97% 98%  Weight:      Height:       No intake or output data in the 24 hours ending 06/19/20 1318 Filed Weights   06/18/20 2023  Weight: 65.8 kg    Examination:  GENERAL: Frail looking elderly male.  Nontoxic.  HEENT: MMM.  Vision and hearing grossly intact.  NECK: Supple.  No apparent JVD.  RESP:  No IWOB.  Fair aeration bilaterally. CVS: Irregular rhythm.  Normal rate.  Heart sounds normal.  ABD/GI/GU: BS+. Abd soft, NTND.  MSK/EXT:  Moves  extremities.  Significant muscle mass and subcu fat loss. SKIN: no apparent skin lesion or wound NEURO: Awake, alert and oriented x4.  Slurred speech.  Motor 3/5 in RLE, 4/5 in LLE, 4+/5 elsewhere. PSYCH: Calm. Normal affect.   Procedures:  None  Microbiology summarized: COVID-19 PCR negative. Influenza PCR negative.  Assessment & Plan: Orthostatic hypotension/presyncope-likely iatrogenic but also at risk for dehydration in the setting of poor p.o. intake, autonomic dysregulation from poorly controlled diabetes and CVA. Significant orthostasis again this morning.  Echo in 04/2020 with EF of 50 to 55%, R WMA and severe hypokinesis.  -Discontinue ramipril, Lasix, Cardura and Flomax for now -Reduce home Toprol-XL to 25 mg daily -IV LR at 50 cc an hour -TED hose.  Check TSH and cortisol levels -PT/OT eval  Essential hypertension with orthostatic hypotension -Antihypertensive and diuretics as above  Uncontrolled DM-2 with hyperglycemia and neuropathy: On glimepiride at home. -Stop glimepiride -Start SSI and CBG monitoring -Check hemoglobin A1c -Continue home statin.  History of CVA-seems to have residual dysarthria and RLE weakness.  Per patient, dysarthria has been there for about 5 weeks which correlates with his admission in August. -Continue statin and Xarelto  Paroxysmal A. Fib: EKG SNR.  Rate controlled -Reduce metoprolol as above -Continue home Xarelto  History of metastatic prostate cancer-on Xtandi and Cardura. Gillermina Phy on hold due to Hgb less than 8.0 -Hold Cardura.  May consider Flomax -Monitor intake and output  and intermittent bladder scan  Normocytic anemia: Baseline Hgb 9-10> 8.0 (admit)>> 7.9.  Anemia panel consistent with anemia of chronic disease.  -Continue monitoring -Could benefit from blood transfusion.  We will reassess this in the morning.  Debility/physical deconditioning: Walker dependent at SNF. -PT/OT eval  Moderate protein calorie malnutrition:  Significant muscle mass loss Body mass index is 20.81 kg/m.  Consult dietitian     Pressure Injury 05/03/20 Coccyx Stage 1 -  Intact skin with non-blanchable redness of a localized area usually over a bony prominence. pink (Active)  05/03/20 0908  Location: Coccyx  Location Orientation:   Staging: Stage 1 -  Intact skin with non-blanchable redness of a localized area usually over a bony prominence.  Wound Description (Comments): pink  Present on Admission:    DVT prophylaxis:  rivaroxaban (XARELTO) tablet 20 mg Start: 06/19/20 1700 Place TED hose Start: 06/19/20 0806 rivaroxaban (XARELTO) tablet 20 mg  Code Status: Full code-confirmed with patient. Family Communication: Updated patient's daughter at bedside and another daughter over the phone Status is: Observation  The patient will require care spanning > 2 midnights and should be moved to inpatient because: Hemodynamically unstable, Unsafe d/c plan, IV treatments appropriate due to intensity of illness or inability to take PO and Inpatient level of care appropriate due to severity of illness  Dispo: The patient is from: SNF              Anticipated d/c is to: SNF              Anticipated d/c date is: 2 days              Patient currently is not medically stable to d/c.       Consultants:  None   Sch Meds:  Scheduled Meds: . acetaminophen  500 mg Oral Q8H  . atorvastatin  20 mg Oral QHS  . calcium carbonate  1,250 mg Oral Q breakfast  . feeding supplement (ENSURE ENLIVE)  237 mL Oral BID BM  . feeding supplement (PRO-STAT SUGAR FREE 64)  30 mL Oral BID  . [START ON 06/20/2020] influenza vaccine adjuvanted  0.5 mL Intramuscular Tomorrow-1000  . insulin aspart  0-5 Units Subcutaneous QHS  . insulin aspart  0-9 Units Subcutaneous TID WC  . [START ON 06/20/2020] metoprolol succinate  25 mg Oral Daily  . metoprolol tartrate  12.5 mg Oral BID  . multivitamin with minerals  1 tablet Oral Daily  . polyethylene glycol  17 g  Oral Daily  . potassium chloride  40 mEq Oral Q4H  . rivaroxaban  20 mg Oral Q supper   Continuous Infusions: . lactated ringers 50 mL/hr at 06/19/20 1302   PRN Meds:.docusate sodium, ondansetron **OR** ondansetron (ZOFRAN) IV, traMADol  Antimicrobials: Anti-infectives (From admission, onward)   None       I have personally reviewed the following labs and images: CBC: Recent Labs  Lab 06/18/20 1942 06/19/20 0500  WBC 9.2 7.8  NEUTROABS 7.3  --   HGB 8.0* 7.9*  HCT 26.0* 26.6*  MCV 78.3* 79.9*  PLT 206 194   BMP &GFR Recent Labs  Lab 06/18/20 1942 06/19/20 0500 06/19/20 0830  NA 140 140  --   K 3.6 3.3*  --   CL 106 107  --   CO2 22 21*  --   GLUCOSE 150* 108*  --   BUN 13 13  --   CREATININE 0.76 0.66  --   CALCIUM 7.5* 7.1*  --  MG 2.0  --  1.9   Estimated Creatinine Clearance: 65.1 mL/min (by C-G formula based on SCr of 0.66 mg/dL). Liver & Pancreas: Recent Labs  Lab 06/18/20 1942  AST 38  ALT 21  ALKPHOS 346*  BILITOT 0.9  PROT 6.5  ALBUMIN 2.0*   No results for input(s): LIPASE, AMYLASE in the last 168 hours. No results for input(s): AMMONIA in the last 168 hours. Diabetic: No results for input(s): HGBA1C in the last 72 hours. Recent Labs  Lab 06/13/20 1127  GLUCAP 201*   Cardiac Enzymes: No results for input(s): CKTOTAL, CKMB, CKMBINDEX, TROPONINI in the last 168 hours. No results for input(s): PROBNP in the last 8760 hours. Coagulation Profile: No results for input(s): INR, PROTIME in the last 168 hours. Thyroid Function Tests: No results for input(s): TSH, T4TOTAL, FREET4, T3FREE, THYROIDAB in the last 72 hours. Lipid Profile: No results for input(s): CHOL, HDL, LDLCALC, TRIG, CHOLHDL, LDLDIRECT in the last 72 hours. Anemia Panel: Recent Labs    06/19/20 0905  VITAMINB12 1,048*  FERRITIN 1,297*  TIBC 133*  IRON 40*   Urine analysis:    Component Value Date/Time   COLORURINE YELLOW 06/18/2020 2200   APPEARANCEUR CLEAR  06/18/2020 2200   LABSPEC 1.009 06/18/2020 2200   PHURINE 7.0 06/18/2020 2200   GLUCOSEU NEGATIVE 06/18/2020 2200   HGBUR NEGATIVE 06/18/2020 2200   BILIRUBINUR NEGATIVE 06/18/2020 2200   BILIRUBINUR negative 03/18/2014 1014   KETONESUR 5 (A) 06/18/2020 2200   PROTEINUR NEGATIVE 06/18/2020 2200   UROBILINOGEN 1.0 03/31/2014 1228   NITRITE NEGATIVE 06/18/2020 2200   LEUKOCYTESUR NEGATIVE 06/18/2020 2200   Sepsis Labs: Invalid input(s): PROCALCITONIN, [place]  Microbiology: Recent Results (from the past 240 hour(s))  Culture, blood (Routine x 2)     Status: None   Collection Time: 06/09/20  3:25 PM   Specimen: BLOOD  Result Value Ref Range Status   Specimen Description   Final    BLOOD RIGHT ANTECUBITAL Performed at [facility] [address], [place], [place] [address]    Special Requests   Final    BOTTLES DRAWN AEROBIC AND ANAEROBIC Blood Culture adequate volume Performed at [facility] [address], [place], [place] [address]    Culture   Final    NO GROWTH 5 DAYS Performed at [facility], [place] [address] [place], [place] [address]    Report Status 06/14/2020 FINAL  Final  Respiratory Panel by RT PCR (Flu A&B, Covid) - Nasopharyngeal Swab     Status: None   Collection Time: 06/09/20  3:40 PM   Specimen: Nasopharyngeal Swab  Result Value Ref Range Status   SARS Coronavirus 2 by RT PCR NEGATIVE NEGATIVE Final    Comment: (NOTE) SARS-CoV-2 target nucleic acids are NOT DETECTED.  The SARS-CoV-2 RNA is generally detectable in upper respiratoy specimens during the acute phase of infection. The lowest concentration of SARS-CoV-2 viral copies this assay can detect is 131 copies/mL. A negative result does not preclude SARS-Cov-2 infection and should not be used as the sole basis for treatment or other patient management decisions. A negative result may occur with  improper specimen collection/handling, submission of  specimen other than nasopharyngeal swab, presence of viral mutation(s) within the areas targeted by this assay, and inadequate number of viral copies (<131 copies/mL). A negative result must be combined with clinical observations, patient history, and epidemiological information. The expected result is Negative.  Fact Sheet for Patients:  PinkCheek.be  Fact Sheet  for Healthcare Providers:  GravelBags.it  This test is no t yet approved or cleared by the [place] and  has been authorized for detection and/or diagnosis of SARS-CoV-2 by FDA under an Emergency Use Authorization (EUA). This EUA will remain  in effect (meaning this test can be used) for the duration of the COVID-19 declaration under Section 564(b)(1) of the Act, 21 U.S.C. section 360bbb-3(b)(1), unless the authorization is terminated or revoked sooner.     Influenza A by PCR NEGATIVE NEGATIVE Final   Influenza B by PCR NEGATIVE NEGATIVE Final    Comment: (NOTE) The Xpert Xpress SARS-CoV-2/FLU/RSV assay is intended as an aid in  the diagnosis of influenza from Nasopharyngeal swab specimens and  should not be used as a sole basis for treatment. Nasal washings and  aspirates are unacceptable for Xpert Xpress SARS-CoV-2/FLU/RSV  testing.  Fact Sheet for Patients: PinkCheek.be  Fact Sheet for Healthcare Providers: GravelBags.it  This test is not yet approved or cleared by the [place] FDA and  has been authorized for detection and/or diagnosis of SARS-CoV-2 by  FDA under an Emergency Use Authorization (EUA). This EUA will remain  in effect (meaning this test can be used) for the duration of the  Covid-19 declaration under Section 564(b)(1) of the Act, 21  U.S.C. section 360bbb-3(b)(1), unless the authorization is  terminated or revoked. Performed at [facility], [place]  [address], [place], [place] [address]   MRSA PCR Screening     Status: None   Collection Time: 06/10/20 12:02 AM   Specimen: Nasal Mucosa; Nasopharyngeal  Result Value Ref Range Status   MRSA by PCR NEGATIVE NEGATIVE Final    Comment:        The GeneXpert MRSA Assay (FDA approved for NASAL specimens only), is one component of a comprehensive MRSA colonization surveillance program. It is not intended to diagnose MRSA infection nor to guide or monitor treatment for MRSA infections. Performed at [facility], [place] [address] [place], [place] [address]   Culture, blood (Routine x 2)     Status: None   Collection Time: 06/10/20 10:12 AM   Specimen: BLOOD RIGHT HAND  Result Value Ref Range Status   Specimen Description   Final    BLOOD RIGHT HAND Performed at [facility] [address] [place], [place] [address]    Special Requests   Final    BOTTLES DRAWN AEROBIC AND ANAEROBIC Blood Culture adequate volume Performed at [facility] [address] [place], [place] [address]    Culture   Final    NO GROWTH 5 DAYS Performed at [facility], [place] [address], [place], [place] [address]    Report Status 06/15/2020 FINAL  Final  Respiratory Panel by RT PCR (Flu A&B, Covid) - Nasopharyngeal Swab     Status: None   Collection Time: 06/13/20 12:52 PM   Specimen: Nasopharyngeal Swab  Result Value Ref Range Status   SARS Coronavirus 2 by RT PCR NEGATIVE NEGATIVE Final    Comment: (NOTE) SARS-CoV-2 target nucleic acids are NOT DETECTED.  The SARS-CoV-2 RNA is generally detectable in upper respiratoy specimens during the acute phase of infection. The lowest concentration of SARS-CoV-2 viral copies this assay can detect is 131 copies/mL. A negative result does not preclude SARS-Cov-2 infection and should not be used as the sole basis for treatment or other patient management decisions. A negative result may occur with    improper specimen collection/handling,  submission of specimen other than nasopharyngeal swab, presence of viral mutation(s) within the areas targeted by this assay, and inadequate number of viral copies (<131 copies/mL). A negative result must be combined with clinical observations, patient history, and epidemiological information. The expected result is Negative.  Fact Sheet for Patients:  PinkCheek.be  Fact Sheet for Healthcare Providers:  GravelBags.it  This test is no t yet approved or cleared by the [place] FDA and  has been authorized for detection and/or diagnosis of SARS-CoV-2 by FDA under an Emergency Use Authorization (EUA). This EUA will remain  in effect (meaning this test can be used) for the duration of the COVID-19 declaration under Section 564(b)(1) of the Act, 21 U.S.C. section 360bbb-3(b)(1), unless the authorization is terminated or revoked sooner.     Influenza A by PCR NEGATIVE NEGATIVE Final   Influenza B by PCR NEGATIVE NEGATIVE Final    Comment: (NOTE) The Xpert Xpress SARS-CoV-2/FLU/RSV assay is intended as an aid in  the diagnosis of influenza from Nasopharyngeal swab specimens and  should not be used as a sole basis for treatment. Nasal washings and  aspirates are unacceptable for Xpert Xpress SARS-CoV-2/FLU/RSV  testing.  Fact Sheet for Patients: PinkCheek.be  Fact Sheet for Healthcare Providers: GravelBags.it  This test is not yet approved or cleared by the [place] FDA and  has been authorized for detection and/or diagnosis of SARS-CoV-2 by  FDA under an Emergency Use Authorization (EUA). This EUA will remain  in effect (meaning this test can be used) for the duration of the  Covid-19 declaration under Section 564(b)(1) of the Act, 21  U.S.C. section 360bbb-3(b)(1), unless the authorization is  terminated or  revoked. Performed at [facility], [place] [address], [place], [place] [address]   Respiratory Panel by RT PCR (Flu A&B, Covid) - Nasopharyngeal Swab     Status: None   Collection Time: 06/18/20  7:43 PM   Specimen: Nasopharyngeal Swab  Result Value Ref Range Status   SARS Coronavirus 2 by RT PCR NEGATIVE NEGATIVE Final    Comment: (NOTE) SARS-CoV-2 target nucleic acids are NOT DETECTED.  The SARS-CoV-2 RNA is generally detectable in upper respiratoy specimens during the acute phase of infection. The lowest concentration of SARS-CoV-2 viral copies this assay can detect is 131 copies/mL. A negative result does not preclude SARS-Cov-2 infection and should not be used as the sole basis for treatment or other patient management decisions. A negative result may occur with  improper specimen collection/handling, submission of specimen other than nasopharyngeal swab, presence of viral mutation(s) within the areas targeted by this assay, and inadequate number of viral copies (<131 copies/mL). A negative result must be combined with clinical observations, patient history, and epidemiological information. The expected result is Negative.  Fact Sheet for Patients:  PinkCheek.be  Fact Sheet for Healthcare Providers:  GravelBags.it  This test is no t yet approved or cleared by the [place] FDA and  has been authorized for detection and/or diagnosis of SARS-CoV-2 by FDA under an Emergency Use Authorization (EUA). This EUA will remain  in effect (meaning this test can be used) for the duration of the COVID-19 declaration under Section 564(b)(1) of the Act, 21 U.S.C. section 360bbb-3(b)(1), unless the authorization is terminated or revoked sooner.     Influenza A by PCR NEGATIVE NEGATIVE Final   Influenza B by PCR NEGATIVE NEGATIVE Final    Comment: (NOTE) The Xpert Xpress SARS-CoV-2/FLU/RSV assay is intended as  an aid in  the  diagnosis of influenza from Nasopharyngeal swab specimens and  should not be used as a sole basis for treatment. Nasal washings and  aspirates are unacceptable for Xpert Xpress SARS-CoV-2/FLU/RSV  testing.  Fact Sheet for Patients: PinkCheek.be  Fact Sheet for Healthcare Providers: GravelBags.it  This test is not yet approved or cleared by the [place] FDA and  has been authorized for detection and/or diagnosis of SARS-CoV-2 by  FDA under an Emergency Use Authorization (EUA). This EUA will remain  in effect (meaning this test can be used) for the duration of the  Covid-19 declaration under Section 564(b)(1) of the Act, 21  U.S.C. section 360bbb-3(b)(1), unless the authorization is  terminated or revoked. Performed at [facility], [place] [address], [place], [place] [address]     Radiology Studies: CT Head Wo Contrast  Result Date: 06/18/2020 CLINICAL DATA:  Weakness EXAM: CT HEAD WITHOUT CONTRAST TECHNIQUE: Contiguous axial images were obtained from the base of the skull through the vertex without intravenous contrast. COMPARISON:  April 30, 2020 FINDINGS: Brain: No evidence of acute territorial infarction, hemorrhage, hydrocephalus,extra-axial collection or mass lesion/mass effect. There is dilatation the ventricles and sulci consistent with age-related atrophy. Low-attenuation changes in the deep white matter consistent with small vessel ischemia. Vascular: No hyperdense vessel or unexpected calcification. Skull: The skull is intact. No fracture or focal lesion identified. Sinuses/Orbits: The visualized paranasal sinuses and mastoid air cells are clear. The orbits and globes intact. Other: None IMPRESSION: No acute intracranial abnormality. Findings consistent with age related atrophy and chronic small vessel ischemia Electronically Signed   By: Prudencio Pair M.D.   On: 06/18/2020 23:05   DG  Chest Portable 1 View  Result Date: 06/18/2020 CLINICAL DATA:  Weakness. EXAM: PORTABLE CHEST 1 VIEW COMPARISON:  June 09, 2020 FINDINGS: Low lung volumes are seen which is likely secondary to the degree of patient inspiration. The heart size and mediastinal contours are within normal limits. Both lungs are clear. The visualized skeletal structures are unremarkable. IMPRESSION: No active cardiopulmonary disease. Electronically Signed   By: Virgina Norfolk M.D.   On: 06/18/2020 20:05     Zanylah Hardie T. Donahue  If 7PM-7AM, please contact night-coverage [email or web address] 06/19/2020, 1:18 PM

## 2020-06-20 DIAGNOSIS — R197 Diarrhea, unspecified: Secondary | ICD-10-CM

## 2020-06-20 LAB — RENAL FUNCTION PANEL
Albumin: 1.7 g/dL — ABNORMAL LOW (ref 3.5–5.0)
Anion gap: 8 (ref 5–15)
BUN: 13 mg/dL (ref 8–23)
CO2: 23 mmol/L (ref 22–32)
Calcium: 7.5 mg/dL — ABNORMAL LOW (ref 8.9–10.3)
Chloride: 109 mmol/L (ref 98–111)
Creatinine, Ser: 0.6 mg/dL — ABNORMAL LOW (ref 0.61–1.24)
GFR calc Af Amer: >60 mL/min (ref >60)
GFR calc non Af Amer: >60 mL/min (ref >60)
Glucose, Bld: 70 mg/dL (ref 70–99)
Phosphorus: 2.1 mg/dL — ABNORMAL LOW (ref 2.5–4.6)
Potassium: 3.8 mmol/L (ref 3.5–5.1)
Sodium: 140 mmol/L (ref 135–145)

## 2020-06-20 LAB — GLUCOSE, CAPILLARY
Glucose-Capillary: 106 mg/dL — ABNORMAL HIGH (ref 70–99)
Glucose-Capillary: 67 mg/dL — ABNORMAL LOW (ref 70–99)
Glucose-Capillary: 141 mg/dL — ABNORMAL HIGH (ref 70–99)
Glucose-Capillary: 77 mg/dL (ref 70–99)
Glucose-Capillary: 190 mg/dL — ABNORMAL HIGH (ref 70–99)

## 2020-06-20 LAB — CBC
HCT: 27.7 % — ABNORMAL LOW (ref 39.0–52.0)
Hemoglobin: 8.2 g/dL — ABNORMAL LOW (ref 13.0–17.0)
MCH: 23.6 pg — ABNORMAL LOW (ref 26.0–34.0)
MCHC: 29.6 g/dL — ABNORMAL LOW (ref 30.0–36.0)
MCV: 79.8 fL — ABNORMAL LOW (ref 80.0–100.0)
Platelets: 204 10*3/uL (ref 150–400)
RBC: 3.47 MIL/uL — ABNORMAL LOW (ref 4.22–5.81)
RDW: 21.2 % — ABNORMAL HIGH (ref 11.5–15.5)
WBC: 6.7 10*3/uL (ref 4.0–10.5)
nRBC: 0 % (ref 0.0–0.2)

## 2020-06-20 LAB — MAGNESIUM: Magnesium: 1.8 mg/dL (ref 1.7–2.4)

## 2020-06-20 MED ORDER — METOPROLOL TARTRATE 12.5 MG HALF TABLET
12.5000 mg | ORAL_TABLET | Freq: Two times a day (BID) | ORAL | Status: DC
  Administered 2020-06-21 – 2020-06-24 (×7): 12.5 mg via ORAL
  Filled 2020-06-20 (×7): qty 1

## 2020-06-20 MED ORDER — LACTATED RINGERS IV SOLN: INTRAVENOUS | Status: AC

## 2020-06-20 NOTE — TOC Initial Note (Signed)
Transition of Care [facility]) - Initial/Assessment Note    Patient Details  Name: Adrian Singh MRN: [id number] Date of Birth: [birth year]  Transition of Care [facility]) CM/SW Contact:    Lennart Pall, LCSW Phone Number: 06/20/2020, 9:53 AM  Clinical Narrative:                 Met with pt and daughter this morning to review dc plans.  Pt was admitted from [facility] and they are planning for him to return but did have some questions about insurance coverage and services covered when he is able to return home.  Pt and daughter had been pleased with care in general at SNF but will some concerns about med changes that had taken place - MD entering when I left and discussing the concerns further with them.  Await medical clearance for pt to return to SNF.  Expected Discharge Plan: Skilled Nursing Facility Barriers to Discharge: Continued Medical Work up   Patient Goals and CMS Choice Patient states their goals for this hospitalization and ongoing recovery are:: to eventually return home CMS Medicare.gov Compare Post Acute Care list provided to:: Patient Choice offered to / list presented to : Adult Children  Expected Discharge Plan and Services Expected Discharge Plan: [place] In-house Referral: Clinical Social Work   Post Acute Care Choice: [place] Living arrangements for the past 2 months: [place]                 DME Arranged: N/A DME Agency: NA       HH Arranged: NA [place] Agency: NA        Prior Living Arrangements/Services Living arrangements for the past 2 months: [place]   Patient language and need for interpreter reviewed:: Yes Do you feel safe going back to the place where you live?: Yes      Need for Family Participation in Patient Care: Yes (Comment) Care giver support system in place?: Yes (comment)   Criminal Activity/Legal Involvement Pertinent to Current Situation/Hospitalization: No -  Comment as needed  Activities of Daily Living Home Assistive Devices/Equipment: Environmental consultant (specify type), Dentures (specify type), Wheelchair, CBG Meter ADL Screening (condition at time of admission) Patient's cognitive ability adequate to safely complete daily activities?: Yes Is the patient deaf or have difficulty hearing?: Yes Does the patient have difficulty seeing, even when wearing glasses/contacts?: Yes Does the patient have difficulty concentrating, remembering, or making decisions?: No Patient able to express need for assistance with ADLs?: Yes Does the patient have difficulty dressing or bathing?: Yes Independently performs ADLs?: No Does the patient have difficulty walking or climbing stairs?: Yes Weakness of Legs: Both Weakness of Arms/Hands: Both  Permission Sought/Granted Permission sought to share information with : Facility Sport and exercise psychologist, Family Supports Permission granted to share information with : Yes, Verbal Permission Granted  Share Information with NAME: Morene Rankins     Permission granted to share info w Relationship: daughter  Permission granted to share info w Contact Information: [phone]  Emotional Assessment Appearance:: Appears stated age Attitude/Demeanor/Rapport: Gracious, Engaged Affect (typically observed): Accepting, Pleasant Orientation: : Oriented to Self, Oriented to Place, Oriented to  Time, Oriented to Situation Alcohol / Substance Use: Not Applicable Psych Involvement: No (comment)  Admission diagnosis:  Orthostatic hypotension [I95.1] Patient Active Problem List   Diagnosis Date Noted  . Postural dizziness with presyncope 06/09/2020  . Protein-calorie malnutrition, severe 05/06/2020  . Palliative care by specialist   . Goals of care,  counseling/discussion   . DNR (do not resuscitate) discussion   . FTT (failure to thrive) in adult   . Need for emotional support   . Pressure injury of skin 05/04/2020  . Stroke ([place])  05/02/2020  . Generalized weakness 04/30/2020  . Fall at home, initial encounter 04/30/2020  . Acute on chronic anemia 04/30/2020  . Elevated CK 04/30/2020  . Chronic kidney disease, stage 3a ([place]) 04/30/2020  . Elevated troponin level not due myocardial infarction 04/30/2020  . Fatigue 04/29/2020  . Malnutrition of mild degree ([place]) 04/06/2019  . Diabetic foot ulcer ([place]) 04/06/2019  . Mood disorder ([place]) 04/06/2019  . Aortic atherosclerosis ([place]) 02/13/2017  . Prostate cancer ([place]) 02/13/2017  . Primary malignant neoplasm of prostate metastatic to bone ([place]) 02/13/2017  . Orthostatic hypotension 03/19/2016  . CAD S/P percutaneous coronary angioplasty 02/28/2016  . Atherosclerosis of native coronary artery of native heart with angina pectoris ([place])   . Abnormal nuclear stress test 09/22/2015  . Thrombocytopenia ([place])   . Advanced directives, counseling/discussion 09/27/2014  . PAF (paroxysmal atrial fibrillation) ([place]) 04/12/2014  . History of CVA (cerebrovascular accident) 03/31/2014  . CKD stage 3 due to type 2 diabetes mellitus ([place])   . Uncontrolled type 2 diabetes mellitus with diabetic polyneuropathy, without long-term current use of insulin ([place]) 11/05/2011  . Routine general medical examination at a health care facility 05/07/2011  . Essential hypertension 12/23/2009  . GERD 12/23/2009  . BPH (benign prostatic hyperplasia) 12/23/2009  . OSTEOARTHRITIS 12/23/2009   PCP:  [name], MD Pharmacy:   [phone] [address], [place] [address] Phone: [phone] Fax: [phone]  [place] [address] [place] [address] [place] [address] Phone: [phone] Fax: [phone]  [facility] [address] [place], [place] [facility] AT [facility] [place] [address] Phone: [phone] Fax: [phone]     Social Determinants of  Health ([place]) Interventions    Readmission Risk Interventions Readmission Risk Prevention Plan 06/20/2020 06/11/2020  Transportation Screening Complete Complete  Medication Review ([place]) Complete Complete  PCP or Specialist appointment within 3-5 days of discharge Complete -  [place] or Home Care Consult Complete -  SW Recovery Care/Counseling Consult Complete Complete  Palliative Care Screening Not Applicable Not Applicable  Skilled Nursing Facility Complete Complete  Some recent data might be hidden

## 2020-06-20 NOTE — Progress Notes (Signed)
PROGRESS NOTE  Adrian Singh YWV:[id number] DOB: 1934-07-16   PCP: [name], MD  Patient is from: SNF.  Reports using walker at baseline.  DOA: 06/18/2020 LOS: 1  Brief Narrative / Interim history: 86 year old male with history of diastolic CHF, DM-2, metastatic prostate cancer, A. fib on Xarelto, CVA with residual dysarthria, nonobstructive CAD, debility, and recent hospitalization 9/23-9/27 for orthostatic hypotension returning with presyncope and orthostatic hypotension.  At that time, it was felt to be due to diuretics, Cardura and poor p.o. intake but the medications were resumed on discharge to SNF. Per review of MAR from NH, Flomax was added, and patient has been getting Flomax and Cardura.   In ED, orthostatic vitals remained positive despite bolus IV fluids.  The decision was made to admit for observation.  CMP without significant finding.  Hgb 8.0 (about baseline).  Subjective: Seen and examined earlier this morning.  No major events overnight of this morning.  He had some loose bowel movements yesterday.  Denies abdominal pain, nausea or vomiting.  Denies chest pain, shortness of breath or palpitation.  No dizziness in bed.  Denies problem with urination.  Daughter at bedside.  Objective: Vitals:   06/19/20 2053 06/20/20 0512 06/20/20 1040 06/20/20 1401  BP: (!) 155/65 (!) 162/73 (!) 156/62 (!) 167/64  Pulse: 62 65 70 65  Resp: 17 14 16 16   Temp: (!) 97.5 F (36.4 C) (!) 97.5 F (36.4 C)  98.1 F (36.7 C)  TempSrc: Oral Oral  Oral  SpO2: 100% 100% 100% 99%  Weight:      Height:        Intake/Output Summary (Last 24 hours) at 06/20/2020 1458 Last data filed at 06/20/2020 1412 Gross per 24 hour  Intake 2343.88 ml  Output 2050 ml  Net 293.88 ml   Filed Weights   06/18/20 2023  Weight: 65.8 kg    Examination:  GENERAL: No apparent distress.  Nontoxic. HEENT: MMM.  Vision and hearing grossly intact.  NECK: Supple.  No apparent JVD.  RESP: On room air.   No IWOB.  Fair aeration bilaterally. CVS:  RRR. Heart sounds normal.  ABD/GI/GU: BS+. Abd soft, NTND.  MSK/EXT:  Moves extremities. No apparent deformity. No edema.  SKIN: no apparent skin lesion or wound NEURO: Awake, alert and oriented.  Some slurred speech.  Motor 4/5 in RLE, 4+/5 elsewhere. PSYCH: Calm. Normal affect.   Procedures:  None  Microbiology summarized: COVID-19 PCR negative. Influenza PCR negative.  Assessment & Plan: Orthostatic hypotension/presyncope-likely iatrogenic but also at risk for dehydration in the setting of poor p.o. intake, autonomic dysregulation from poorly controlled diabetes and CVA. Significant orthostasis again this morning.  Echo in 04/2020 with EF of 50 to 55%, R WMA and severe hypokinesis.  TSH and a.m. cortisol was normal.  Orthostatic vitals positive sitting and 0-minute standing.  He did not tolerate 3 minutes standing. -Continue holding ramipril, Lasix, Cardura and Flomax -Change metoprolol to 12.5 mg twice daily starting tomorrow -Continue IV LR at 50 cc an hour -TED hose.  May have to add abdominal binder -PT/OT eval  Essential hypertension with orthostatic hypotension -Antihypertensive and diuretics as above  Uncontrolled DM-2 with hyperglycemia and neuropathy: On glimepiride at home. Recent Labs  Lab 06/19/20 1643 06/19/20 2051 06/20/20 0759 06/20/20 0826 06/20/20 1156  GLUCAP 97 89 67* 106* 141*  -Continue SSI and CBG monitoring -Check hemoglobin A1c -Continue home statin.  History of CVA-seems to have residual dysarthria and RLE weakness.  Per patient,  dysarthria has been there for about 5 weeks which correlates with his admission in August. -Continue statin and Xarelto  Paroxysmal A. Fib: EKG SNR.  Rate controlled -Adjusted metoprolol as above. -Continue home Xarelto  History of metastatic prostate cancer-on Xtandi and Cardura. -Resume Xtandi -Cardura and Flomax discontinued -Monitor intake and output and intermittent  bladder scan  Normocytic anemia: Baseline Hgb 9-10> 8.0 (admit)>> 7.9> 8.2.  Anemia panel consistent with anemia of chronic disease.  -Continue monitoring -Could benefit from blood transfusion.  We will reassess this in the morning.  Diarrhea: Seems to have subsided.  Very low suspicion for C. difficile.  No leukocytosis, fever or abdominal tenderness. -As needed Imodium -Continue IV fluid  Debility/physical deconditioning: Walker dependent at [facility]. -PT/OT eval-recommended SNF.  Moderate protein calorie malnutrition: Significant muscle mass loss and hypoalbuminemia Body mass index is 20.81 kg/m.  -Consult dietitian     DVT prophylaxis:  rivaroxaban (XARELTO) tablet 20 mg Start: 06/19/20 1700 Place TED hose Start: 06/19/20 0806 rivaroxaban (XARELTO) tablet 20 mg  Code Status: Full code-confirmed with patient. Family Communication: Updated patient's daughter at bedside and another daughter over the phone Status is: Observation  The patient will require care spanning > 2 midnights and should be moved to inpatient because: Hemodynamically unstable, Unsafe d/c plan, IV treatments appropriate due to intensity of illness or inability to take PO and Inpatient level of care appropriate due to severity of illness  Dispo: The patient is from: SNF              Anticipated d/c is to: SNF              Anticipated d/c date is: 2 days              Patient currently is not medically stable to d/c.       Consultants:  None   Sch Meds:  Scheduled Meds: . acetaminophen  500 mg Oral Q8H  . atorvastatin  20 mg Oral QHS  . calcium carbonate  1,250 mg Oral Q breakfast  . feeding supplement (ENSURE ENLIVE)  237 mL Oral BID BM  . feeding supplement (PRO-STAT SUGAR FREE 64)  30 mL Oral BID  . insulin aspart  0-5 Units Subcutaneous QHS  . insulin aspart  0-9 Units Subcutaneous TID WC  . metoprolol succinate  25 mg Oral Daily  . multivitamin with minerals  1 tablet Oral Daily  . polyethylene  glycol  17 g Oral Daily  . rivaroxaban  20 mg Oral Q supper   Continuous Infusions:  PRN Meds:.docusate sodium, loperamide, ondansetron **OR** ondansetron (ZOFRAN) IV, traMADol  Antimicrobials: Anti-infectives (From admission, onward)   None       I have personally reviewed the following labs and images: CBC: Recent Labs  Lab 06/18/20 1942 06/19/20 0500 06/20/20 0405  WBC 9.2 7.8 6.7  NEUTROABS 7.3  --   --   HGB 8.0* 7.9* 8.2*  HCT 26.0* 26.6* 27.7*  MCV 78.3* 79.9* 79.8*  PLT 206 194 204   BMP &GFR Recent Labs  Lab 06/18/20 1942 06/19/20 0500 06/19/20 0830 06/20/20 0405  NA 140 140  --  140  K 3.6 3.3*  --  3.8  CL 106 107  --  109  CO2 22 21*  --  23  GLUCOSE 150* 108*  --  70  BUN 13 13  --  13  CREATININE 0.76 0.66  --  0.60*  CALCIUM 7.5* 7.1*  --  7.5*  MG 2.0  --  1.9 1.8  PHOS  --   --   --  2.1*   Estimated Creatinine Clearance: 65.1 mL/min (A) (by C-G formula based on SCr of 0.6 mg/dL (L)). Liver & Pancreas: Recent Labs  Lab 06/18/20 1942 06/20/20 0405  AST 38  --   ALT 21  --   ALKPHOS 346*  --   BILITOT 0.9  --   PROT 6.5  --   ALBUMIN 2.0* 1.7*   No results for input(s): LIPASE, AMYLASE in the last 168 hours. No results for input(s): AMMONIA in the last 168 hours. Diabetic: No results for input(s): HGBA1C in the last 72 hours. Recent Labs  Lab 06/19/20 1643 06/19/20 2051 06/20/20 0759 06/20/20 0826 06/20/20 1156  GLUCAP 97 89 67* 106* 141*   Cardiac Enzymes: No results for input(s): CKTOTAL, CKMB, CKMBINDEX, TROPONINI in the last 168 hours. No results for input(s): PROBNP in the last 8760 hours. Coagulation Profile: No results for input(s): INR, PROTIME in the last 168 hours. Thyroid Function Tests: Recent Labs    06/19/20 1255  TSH 4.671*   Lipid Profile: No results for input(s): CHOL, HDL, LDLCALC, TRIG, CHOLHDL, LDLDIRECT in the last 72 hours. Anemia Panel: Recent Labs    06/19/20 0905 06/19/20 1255  VITAMINB12  1,048*  --   FOLATE  --  15.3  FERRITIN 1,297*  --   TIBC 133*  --   IRON 40*  --   RETICCTPCT  --  1.3   Urine analysis:    Component Value Date/Time   COLORURINE YELLOW 06/18/2020 2200   APPEARANCEUR CLEAR 06/18/2020 2200   LABSPEC 1.009 06/18/2020 2200   PHURINE 7.0 06/18/2020 2200   GLUCOSEU NEGATIVE 06/18/2020 2200   HGBUR NEGATIVE 06/18/2020 2200   BILIRUBINUR NEGATIVE 06/18/2020 2200   BILIRUBINUR negative 03/18/2014 1014   KETONESUR 5 (A) 06/18/2020 2200   PROTEINUR NEGATIVE 06/18/2020 2200   UROBILINOGEN 1.0 03/31/2014 1228   NITRITE NEGATIVE 06/18/2020 2200   LEUKOCYTESUR NEGATIVE 06/18/2020 2200   Sepsis Labs: Invalid input(s): PROCALCITONIN, [place]  Microbiology: Recent Results (from the past 240 hour(s))  Respiratory Panel by RT PCR (Flu A&B, Covid) - Nasopharyngeal Swab     Status: None   Collection Time: 06/13/20 12:52 PM   Specimen: Nasopharyngeal Swab  Result Value Ref Range Status   SARS Coronavirus 2 by RT PCR NEGATIVE NEGATIVE Final    Comment: (NOTE) SARS-CoV-2 target nucleic acids are NOT DETECTED.  The SARS-CoV-2 RNA is generally detectable in upper respiratoy specimens during the acute phase of infection. The lowest concentration of SARS-CoV-2 viral copies this assay can detect is 131 copies/mL. A negative result does not preclude SARS-Cov-2 infection and should not be used as the sole basis for treatment or other patient management decisions. A negative result may occur with  improper specimen collection/handling, submission of specimen other than nasopharyngeal swab, presence of viral mutation(s) within the areas targeted by this assay, and inadequate number of viral copies (<131 copies/mL). A negative result must be combined with clinical observations, patient history, and epidemiological information. The expected result is Negative.  Fact Sheet for Patients:  PinkCheek.be  Fact Sheet for Healthcare  Providers:  GravelBags.it  This test is no t yet approved or cleared by the [place] FDA and  has been authorized for detection and/or diagnosis of SARS-CoV-2 by FDA under an Emergency Use Authorization (EUA). This EUA will remain  in effect (meaning this test can be used) for the duration of the COVID-19 declaration under Section 564(b)(1)  of the Act, 21 U.S.C. section 360bbb-3(b)(1), unless the authorization is terminated or revoked sooner.     Influenza A by PCR NEGATIVE NEGATIVE Final   Influenza B by PCR NEGATIVE NEGATIVE Final    Comment: (NOTE) The Xpert Xpress SARS-CoV-2/FLU/RSV assay is intended as an aid in  the diagnosis of influenza from Nasopharyngeal swab specimens and  should not be used as a sole basis for treatment. Nasal washings and  aspirates are unacceptable for Xpert Xpress SARS-CoV-2/FLU/RSV  testing.  Fact Sheet for Patients: PinkCheek.be  Fact Sheet for Healthcare Providers: GravelBags.it  This test is not yet approved or cleared by the [place] FDA and  has been authorized for detection and/or diagnosis of SARS-CoV-2 by  FDA under an Emergency Use Authorization (EUA). This EUA will remain  in effect (meaning this test can be used) for the duration of the  Covid-19 declaration under Section 564(b)(1) of the Act, 21  U.S.C. section 360bbb-3(b)(1), unless the authorization is  terminated or revoked. Performed at [facility], [place] [address] [place], [place] [address]   Respiratory Panel by RT PCR (Flu A&B, Covid) - Nasopharyngeal Swab     Status: None   Collection Time: 06/18/20  7:43 PM   Specimen: Nasopharyngeal Swab  Result Value Ref Range Status   SARS Coronavirus 2 by RT PCR NEGATIVE NEGATIVE Final    Comment: (NOTE) SARS-CoV-2 target nucleic acids are NOT DETECTED.  The SARS-CoV-2 RNA is generally detectable in upper  respiratoy specimens during the acute phase of infection. The lowest concentration of SARS-CoV-2 viral copies this assay can detect is 131 copies/mL. A negative result does not preclude SARS-Cov-2 infection and should not be used as the sole basis for treatment or other patient management decisions. A negative result may occur with  improper specimen collection/handling, submission of specimen other than nasopharyngeal swab, presence of viral mutation(s) within the areas targeted by this assay, and inadequate number of viral copies (<131 copies/mL). A negative result must be combined with clinical observations, patient history, and epidemiological information. The expected result is Negative.  Fact Sheet for Patients:  PinkCheek.be  Fact Sheet for Healthcare Providers:  GravelBags.it  This test is no t yet approved or cleared by the [place] FDA and  has been authorized for detection and/or diagnosis of SARS-CoV-2 by FDA under an Emergency Use Authorization (EUA). This EUA will remain  in effect (meaning this test can be used) for the duration of the COVID-19 declaration under Section 564(b)(1) of the Act, 21 U.S.C. section 360bbb-3(b)(1), unless the authorization is terminated or revoked sooner.     Influenza A by PCR NEGATIVE NEGATIVE Final   Influenza B by PCR NEGATIVE NEGATIVE Final    Comment: (NOTE) The Xpert Xpress SARS-CoV-2/FLU/RSV assay is intended as an aid in  the diagnosis of influenza from Nasopharyngeal swab specimens and  should not be used as a sole basis for treatment. Nasal washings and  aspirates are unacceptable for Xpert Xpress SARS-CoV-2/FLU/RSV  testing.  Fact Sheet for Patients: PinkCheek.be  Fact Sheet for Healthcare Providers: GravelBags.it  This test is not yet approved or cleared by the [place] FDA and  has been  authorized for detection and/or diagnosis of SARS-CoV-2 by  FDA under an Emergency Use Authorization (EUA). This EUA will remain  in effect (meaning this test can be used) for the duration of the  Covid-19 declaration under Section 564(b)(1) of the Act, 21  U.S.C. section 360bbb-3(b)(1), unless the authorization is  terminated  or revoked. Performed at [facility] [place] [address], [place], [place] [address]     Radiology Studies: No results found.   Azari Janssens T. [place]  If 7PM-7AM, please contact night-coverage [email or web address] 06/20/2020, 2:58 PM

## 2020-06-20 NOTE — Progress Notes (Signed)
Initial Nutrition Assessment  DOCUMENTATION CODES:   Severe malnutrition in context of chronic illness  INTERVENTION:   -Ensure Enlive po BID, each supplement provides 350 kcal and 20 grams of protein -Prosource Plus PO BID, each provides 100 kcals and 15g protein -Magic cup BID with meals, each supplement provides 290 kcal and 9 grams of protein  NUTRITION DIAGNOSIS:   Severe Malnutrition related to chronic illness, cancer and cancer related treatments as evidenced by percent weight loss, severe fat depletion, severe muscle depletion.  GOAL:   Patient will meet greater than or equal to 90% of their needs  MONITOR:   PO intake, Supplement acceptance, Labs, Weight trends, I & O's  REASON FOR ASSESSMENT:   Consult, Malnutrition Screening Tool Assessment of nutrition requirement/status  ASSESSMENT:   86 year old male with history of diastolic CHF, DM-2, metastatic prostate cancer, A. fib on Xarelto, CVA with residual dysarthria, nonobstructive CAD, debility, and recent hospitalization 9/23-9/27 for orthostatic hypotension returning with presyncope and orthostatic hypotension.  At that time, it was felt to be due to diuretics, Cardura and poor p.o. intake but the medications were resumed on discharge to SNF.  Patient with continued poor appetite and intakes since last admission (9/23-9/27).  Pt with h/o CVA, with dysarthria and RLE weakness.  Currently pt is consuming 20-100% of meals. Pt reporting early satiety with meals and off tastes. Ensure and Prosource supplements have been ordered. Will add Magic Cups to meals as well for additional kcals and protein.  Per weight records, pt has lost 42 lbs since 4/8 (22% wt loss x 6 months, significant for time frame).  Medications: OSCAL, Multivitamin with minerals daily, Lactated ringers   Labs reviewed:  CBGs: 67-141 Low Phos Mg WNL  NUTRITION - FOCUSED PHYSICAL EXAM:    Most Recent Value  Orbital Region Severe depletion   Upper Arm Region Moderate depletion  Thoracic and Lumbar Region Unable to assess  Buccal Region Severe depletion  Temple Region Severe depletion  Clavicle Bone Region Severe depletion  Clavicle and Acromion Bone Region Severe depletion  Scapular Bone Region Severe depletion  Dorsal Hand Mild depletion  Patellar Region Severe depletion  Anterior Thigh Region Severe depletion  Posterior Calf Region Severe depletion  Edema (RD Assessment) None       Diet Order:   Diet Order            Diet heart healthy/carb modified Room service appropriate? Yes; Fluid consistency: Thin  Diet effective now                 EDUCATION NEEDS:   No education needs have been identified at this time  Skin:  Skin Assessment: Reviewed RN Assessment  Last BM:  10/4 -type 7  Height:   Ht Readings from Last 1 Encounters:  06/18/20 5\' 10"  (1.778 m)    Weight:   Wt Readings from Last 1 Encounters:  06/18/20 65.8 kg   BMI:  Body mass index is 20.81 kg/m.  Estimated Nutritional Needs:   Kcal:  1950-2150  Protein:  100-110g  Fluid:  2L/day   Clayton Bibles, MS, RD, LDN Inpatient Clinical Dietitian Contact information available via Amion

## 2020-06-20 NOTE — Evaluation (Signed)
Occupational Therapy Evaluation Patient Details Name: Adrian Singh MRN: [id number] DOB: Aug 21, 1934 Today's Date: 06/20/2020    History of Present Illness 86 year old male with past medical history of coronary artery disease, paroxysmal atrial fibrillation on Xarelto, noninsulin-dependent diabetes mellitus type 2, recent acute stroke 04/2020, hypertension, chronic kidney disease stage IIIa and metastatic prostate cancer to bone who presents to [facility] emergency department with complaints of severe lightheadedness and weakness.  Pt admitted for orthostatic hypotension    Clinical Impression   Pt admitted with the above. Pt currently with functional limitations due to the deficits listed below (see OT Problem List).  Pt will benefit from skilled OT to increase their safety and independence with ADL and functional mobility for ADL to facilitate discharge to venue listed below.      Follow Up Recommendations  SNF    Equipment Recommendations  None recommended by OT    Recommendations for Other Services       Precautions / Restrictions Precautions Precautions: Fall Precaution Comments: orthostatic hypotension Restrictions Weight Bearing Restrictions: No      Mobility Bed Mobility Overal bed mobility: Needs Assistance Bed Mobility: Sit to Supine       Sit to supine: Min assist;Mod assist;+2 for physical assistance;+2 for safety/equipment      Transfers Overall transfer level: Needs assistance Equipment used: Rolling walker (2 wheeled) Transfers: Sit to/from [facility] Sit to Stand: +2 physical assistance;+2 safety/equipment;Mod assist Stand pivot transfers: +2 physical assistance;+2 safety/equipment;From elevated surface;Mod assist       General transfer comment:  Assist to power up, stabilize, control descent.  Cues for safety, technqiue, hand/feet placement.    Balance Overall balance assessment: Needs assistance;History of  Falls Sitting-balance support: No upper extremity supported;Feet supported Sitting balance-Leahy Scale: Fair     Standing balance support: Bilateral upper extremity supported Standing balance-Leahy Scale: Poor                             ADL either performed or assessed with clinical judgement   ADL Overall ADL's : Needs assistance/impaired Eating/Feeding: Set up;Sitting   Grooming: Wash/dry hands;Sitting;Set up   Upper Body Bathing: Set up;Sitting   Lower Body Bathing: Moderate assistance;Sit to/from stand;Cueing for safety;Cueing for sequencing   Upper Body Dressing : Set up;Sitting   Lower Body Dressing: Moderate assistance;Sit to/from stand;Cueing for safety;Cueing for sequencing   Toilet Transfer: Moderate assistance;+2 for physical assistance;+2 for safety/equipment;Cueing for safety;Stand-pivot   Toileting- Clothing Manipulation and Hygiene: Maximal assistance;Sit to/from stand;Cueing for safety;+2 for physical assistance;+2 for safety/equipment               Vision Patient Visual Report: No change from baseline              Pertinent Vitals/Pain Pain Assessment: No/denies pain     Hand Dominance Right   Extremity/Trunk Assessment Upper Extremity Assessment Upper Extremity Assessment: Generalized weakness       Cervical / Trunk Assessment Cervical / Trunk Assessment: Kyphotic   Communication Communication Communication: No difficulties   Cognition Arousal/Alertness: Awake/alert Behavior During Therapy: WFL for tasks assessed/performed Overall Cognitive Status: Within Functional Limits for tasks assessed                                 General Comments: very pleasant and cooperative   General Comments  Home Living Family/patient expects to be discharged to:: Skilled nursing facility Living Arrangements: Spouse/significant other Available Help at Discharge: Family;Available PRN/intermittently Type  of Home: House Home Access: Stairs to enter [employer] of Steps: 3 Entrance Stairs-Rails: None Home Layout: One level     Bathroom Shower/Tub: Walk-in shower         Home Equipment: Environmental consultant - 2 wheels;Cane - single point;Cane - quad;Bedside commode;Shower seat;Hand held shower head;Wheelchair - manual;Grab bars - toilet   Additional Comments: Pt from [facility] (for rehab) but not doing well and not eating or drinking      Prior Functioning/Environment Level of Independence: Needs assistance  Gait / Transfers Assistance Needed: Pt states not getting up much last few weeks     Comments: Pt at [facility] but not progressing well and not eating or drinking sufficiently        OT Problem List: Decreased strength;Impaired balance (sitting and/or standing);Decreased activity tolerance;Decreased knowledge of use of DME or AE;Decreased safety awareness      OT Treatment/Interventions: Self-care/ADL training;Patient/family education;Therapeutic activities    OT Goals(Current goals can be found in the care plan section) Acute Rehab OT Goals Patient Stated Goal: Regain IND OT Goal Formulation: With patient Time For Goal Achievement: 07/04/20 Potential to Achieve Goals: Fair  OT Frequency: Min 2X/week   Barriers to D/C: Decreased caregiver support             AM-PAC OT "6 Clicks" Daily Activity     Outcome Measure Help from another person eating meals?: A Little Help from another person taking care of personal grooming?: A Little Help from another person toileting, which includes using toliet, bedpan, or urinal?: A Lot Help from another person bathing (including washing, rinsing, drying)?: A Lot Help from another person to put on and taking off regular upper body clothing?: A Little Help from another person to put on and taking off regular lower body clothing?: A Lot 6 Click Score: 15   End of Session Nurse Communication: Mobility status  Activity  Tolerance: Patient tolerated treatment well Patient left: in chair  OT Visit Diagnosis: Unsteadiness on feet (R26.81);Other abnormalities of gait and mobility (R26.89);Muscle weakness (generalized) (M62.81)                Time: 1517-6160 OT Time Calculation (min): 25 min Charges:  OT General Charges $OT Visit: 1 Visit OT Evaluation $OT Eval Moderate Complexity: 1 Mod  Kari Baars, [place] [phone] Office- [phone]     Vera Furniss, Edwena Felty D 06/20/2020, 1:04 PM

## 2020-06-21 LAB — GLUCOSE, CAPILLARY
Glucose-Capillary: 104 mg/dL — ABNORMAL HIGH (ref 70–99)
Glucose-Capillary: 170 mg/dL — ABNORMAL HIGH (ref 70–99)
Glucose-Capillary: 211 mg/dL — ABNORMAL HIGH (ref 70–99)
Glucose-Capillary: 154 mg/dL — ABNORMAL HIGH (ref 70–99)

## 2020-06-21 LAB — HEMOGLOBIN AND HEMATOCRIT, BLOOD
HCT: 28.6 % — ABNORMAL LOW (ref 39.0–52.0)
HCT: 28.3 % — ABNORMAL LOW (ref 39.0–52.0)
Hemoglobin: 8.4 g/dL — ABNORMAL LOW (ref 13.0–17.0)
Hemoglobin: 8.3 g/dL — ABNORMAL LOW (ref 13.0–17.0)

## 2020-06-21 LAB — SARS CORONAVIRUS 2 BY RT PCR (HOSPITAL ORDER, PERFORMED IN ~~LOC~~ HOSPITAL LAB): SARS Coronavirus 2: NEGATIVE

## 2020-06-21 LAB — PREALBUMIN: Prealbumin: 10.9 mg/dL — ABNORMAL LOW (ref 18–38)

## 2020-06-21 MED ORDER — ACETAMINOPHEN 500 MG PO TABS
1000.0000 mg | ORAL_TABLET | Freq: Three times a day (TID) | ORAL | Status: DC
  Administered 2020-06-21 – 2020-06-24 (×8): 1000 mg via ORAL
  Filled 2020-06-21 (×8): qty 2

## 2020-06-21 MED ORDER — LACTATED RINGERS IV SOLN: INTRAVENOUS | Status: AC

## 2020-06-21 MED ORDER — DICLOFENAC SODIUM 1 % EX GEL
2.0000 g | Freq: Four times a day (QID) | CUTANEOUS | Status: DC
  Administered 2020-06-21 – 2020-06-24 (×11): 2 g via TOPICAL
  Filled 2020-06-21: qty 100

## 2020-06-21 NOTE — TOC Progression Note (Signed)
Transition of Care [facility]) - Progression Note    Patient Details  Name: Adrian Singh MRN: [id number] Date of Birth: Jan 22, 1934  Transition of [facility]) CM/SW Contact  Lennart Pall, LCSW Phone Number: 06/21/2020, 12:55 PM  Clinical Narrative:    Have received insurance Josem Kaufmann [facility] ref# [id number]) for transition back to [employer].  MD notes pt not ready for dc today but hopefully will be tomorrow.  Pt and family aware.   Expected Discharge Plan: [place] Barriers to Discharge: Continued Medical Work up  Expected Discharge Plan and Services Expected Discharge Plan: [place] In-house Referral: Clinical Social Work   Post Acute Care Choice: [place] Living arrangements for the past 2 months: [place]                 DME Arranged: N/A DME Agency: NA       HH Arranged: NA [place] Agency: NA         Social Determinants of Health (SDOH) Interventions    Readmission Risk Interventions Readmission Risk Prevention Plan 06/20/2020 06/11/2020  Transportation Screening Complete Complete  Medication Review Press photographer) Complete Complete  PCP or Specialist appointment within 3-5 days of discharge Complete -  [place] or Home Care Consult Complete -  SW Recovery Care/Counseling Consult Complete Complete  Palliative Care Screening Not Applicable Not Applicable  Skilled Nursing Facility Complete Complete  Some recent data might be hidden

## 2020-06-21 NOTE — Progress Notes (Signed)
PROGRESS NOTE  Adrian Singh CXK:[id number] DOB: 09/24/1933   PCP: [name], MD  Patient is from: SNF.  Reports using walker at baseline.  DOA: 06/18/2020 LOS: 2  Brief Narrative / Interim history: 86 year old male with history of diastolic CHF, DM-2, metastatic prostate cancer, A. fib on Xarelto, CVA with residual dysarthria, nonobstructive CAD, debility, and recent hospitalization 9/23-9/27 for orthostatic hypotension returning with presyncope and orthostatic hypotension.  At that time, it was felt to be due to diuretics, Cardura and poor p.o. intake but the medications were resumed on discharge to SNF. Per review of MAR from NH, Flomax was added, and patient has been getting Flomax and Cardura.   In ED, orthostatic vitals remained positive despite bolus IV fluids.  The decision was made to admit for observation.  CMP without significant finding.  Hgb 8.0 (about baseline).  Still with positive orthostatic vitals with symptoms.   Subjective: Seen and examined earlier this morning.  No major events overnight or this morning.  No complaint this morning.  However, he felt swimmy headed when he got up for orthostatic vitals yesterday.  Denies problem with urination.   Objective: Vitals:   06/20/20 1401 06/20/20 2134 06/21/20 0015 06/21/20 0639  BP: (!) 167/64 (!) 166/68 (!) 153/76 (!) 158/75  Pulse: 65 71 69 71  Resp: 16 17  15   Temp: 98.1 F (36.7 C) 98 F (36.7 C)  98.1 F (36.7 C)  TempSrc: Oral Oral  Oral  SpO2: 99% 99% 98% 100%  Weight:      Height:        Intake/Output Summary (Last 24 hours) at 06/21/2020 1138 Last data filed at 06/21/2020 1000 Gross per 24 hour  Intake 2337.88 ml  Output 2600 ml  Net -262.12 ml   Filed Weights   06/18/20 2023  Weight: 65.8 kg    Examination:  GENERAL: No apparent distress.  Nontoxic. HEENT: MMM.  Vision and hearing grossly intact.  NECK: Supple.  No apparent JVD.  RESP: On room air.  No IWOB.  Fair aeration  bilaterally. CVS:  RRR. Heart sounds normal.  ABD/GI/GU: BS+. Abd soft, NTND.  MSK/EXT:  Moves extremities. No apparent deformity. No edema.  SKIN: no apparent skin lesion or wound NEURO: Awake, alert and oriented appropriately.  Some slurred speech.  Motor 4/5 in RLE, 4+/5 elsewhere. PSYCH: Calm. Normal affect.  Procedures:  None  Microbiology summarized: COVID-19 PCR negative. Influenza PCR negative.  Assessment & Plan: Orthostatic hypotension/presyncope-likely iatrogenic but also at risk for dehydration in the setting of poor p.o. intake, autonomic dysregulation from poorly controlled diabetes and CVA. Significant orthostasis again this morning.  Echo in 04/2020 with EF of 50 to 55%, R WMA and severe hypokinesis.  TSH and a.m. cortisol was normal.  Orthostatic vitals positive, and he was symptomatic. -Continue holding ramipril, Lasix, Cardura and Flomax.  May have to discontinue these on discharge. -Change metoprolol to 12.5 mg twice daily -Continue IV LR at 50 cc an hour -Requested RN to apply TED hose.  May have to add abdominal binder if no improvement. -OOB/PT/OT eval  Essential hypertension with orthostatic hypotension -Antihypertensive and diuretics as above  Uncontrolled DM-2 with hyperglycemia and neuropathy: On glimepiride at home. Recent Labs  Lab 06/20/20 1156 06/20/20 1618 06/20/20 2015 06/21/20 0736 06/21/20 1135  GLUCAP 141* 77 190* 104* 170*  -Continue SSI and CBG monitoring -Check hemoglobin A1c -Continue home statin.  History of CVA-seems to have residual dysarthria and RLE weakness.  Per patient, dysarthria  has been there for about 5 weeks which correlates with his admission in August. -Continue statin and Xarelto  Paroxysmal A. Fib: EKG SNR.  Rate controlled -Adjusted metoprolol as above. -Continue home Xarelto  History of metastatic prostate cancer-on Xtandi and Cardura. -Resume Xtandi -Cardura and Flomax discontinued -Monitor intake and output  and intermittent bladder scan  Normocytic anemia: Baseline Hgb 9-10> 8.0 (admit)>> 7.9> 8.2.  Anemia panel consistent with anemia of chronic disease.  -Continue monitoring -Check H&H. May transfuse one unit today.   Diarrhea: Seems to have subsided.  Very low suspicion for C. difficile.  No leukocytosis, fever or abdominal tenderness. -As needed Imodium -Continue IV fluid  Debility/physical deconditioning: Walker dependent at [facility]. -PT/OT eval-recommended SNF.  Moderate protein calorie malnutrition: Significant muscle mass loss and hypoalbuminemia.  Prealbumin 10.8. Body mass index is 20.81 kg/m. Nutrition Problem: Severe Malnutrition Etiology: chronic illness, cancer and cancer related treatments-Consult dietitian Signs/Symptoms: percent weight loss, severe fat depletion, severe muscle depletion Percent weight loss: 22 % (x 6 months) Interventions: Ensure Enlive (each supplement provides 350kcal and 20 grams of protein), Prostat, MVI, Magic cup DVT prophylaxis:  rivaroxaban (XARELTO) tablet 20 mg Start: 06/19/20 1700 Place TED hose Start: 06/19/20 0806 rivaroxaban (XARELTO) tablet 20 mg  Code Status: Full code-confirmed with patient. Family Communication: Updated patient's daughter at bedside and another daughter over the phone Status is: Inpatient  Remains inpatient appropriate because:Hemodynamically unstable, IV treatments appropriate due to intensity of illness or inability to take PO and Inpatient level of care appropriate due to severity of illness   Dispo: The patient is from: SNF              Anticipated d/c is to: SNF              Anticipated d/c date is: 1 day              Patient currently is not medically stable to d/c.           Consultants:  None   Sch Meds:  Scheduled Meds: . acetaminophen  500 mg Oral Q8H  . atorvastatin  20 mg Oral QHS  . calcium carbonate  1,250 mg Oral Q breakfast  . feeding supplement (ENSURE ENLIVE)  237 mL Oral BID BM  .  feeding supplement (PRO-STAT SUGAR FREE 64)  30 mL Oral BID  . insulin aspart  0-5 Units Subcutaneous QHS  . insulin aspart  0-9 Units Subcutaneous TID WC  . metoprolol tartrate  12.5 mg Oral BID  . multivitamin with minerals  1 tablet Oral Daily  . rivaroxaban  20 mg Oral Q supper   Continuous Infusions: . lactated ringers 50 mL/hr at 06/21/20 0158   PRN Meds:.loperamide, ondansetron **OR** ondansetron (ZOFRAN) IV, traMADol  Antimicrobials: Anti-infectives (From admission, onward)   None       I have personally reviewed the following labs and images: CBC: Recent Labs  Lab 06/18/20 1942 06/19/20 0500 06/20/20 0405  WBC 9.2 7.8 6.7  NEUTROABS 7.3  --   --   HGB 8.0* 7.9* 8.2*  HCT 26.0* 26.6* 27.7*  MCV 78.3* 79.9* 79.8*  PLT 206 194 204   BMP &GFR Recent Labs  Lab 06/18/20 1942 06/19/20 0500 06/19/20 0830 06/20/20 0405  NA 140 140  --  140  K 3.6 3.3*  --  3.8  CL 106 107  --  109  CO2 22 21*  --  23  GLUCOSE 150* 108*  --  70  BUN 13  13  --  13  CREATININE 0.76 0.66  --  0.60*  CALCIUM 7.5* 7.1*  --  7.5*  MG 2.0  --  1.9 1.8  PHOS  --   --   --  2.1*   Estimated Creatinine Clearance: 65.1 mL/min (A) (by C-G formula based on SCr of 0.6 mg/dL (L)). Liver & Pancreas: Recent Labs  Lab 06/18/20 1942 06/20/20 0405  AST 38  --   ALT 21  --   ALKPHOS 346*  --   BILITOT 0.9  --   PROT 6.5  --   ALBUMIN 2.0* 1.7*   No results for input(s): LIPASE, AMYLASE in the last 168 hours. No results for input(s): AMMONIA in the last 168 hours. Diabetic: No results for input(s): HGBA1C in the last 72 hours. Recent Labs  Lab 06/20/20 1156 06/20/20 1618 06/20/20 2015 06/21/20 0736 06/21/20 1135  GLUCAP 141* 77 190* 104* 170*   Cardiac Enzymes: No results for input(s): CKTOTAL, CKMB, CKMBINDEX, TROPONINI in the last 168 hours. No results for input(s): PROBNP in the last 8760 hours. Coagulation Profile: No results for input(s): INR, PROTIME in the last 168  hours. Thyroid Function Tests: Recent Labs    06/19/20 1255  TSH 4.671*   Lipid Profile: No results for input(s): CHOL, HDL, LDLCALC, TRIG, CHOLHDL, LDLDIRECT in the last 72 hours. Anemia Panel: Recent Labs    06/19/20 0905 06/19/20 1255  VITAMINB12 1,048*  --   FOLATE  --  15.3  FERRITIN 1,297*  --   TIBC 133*  --   IRON 40*  --   RETICCTPCT  --  1.3   Urine analysis:    Component Value Date/Time   COLORURINE YELLOW 06/18/2020 2200   APPEARANCEUR CLEAR 06/18/2020 2200   LABSPEC 1.009 06/18/2020 2200   PHURINE 7.0 06/18/2020 2200   GLUCOSEU NEGATIVE 06/18/2020 2200   HGBUR NEGATIVE 06/18/2020 2200   BILIRUBINUR NEGATIVE 06/18/2020 2200   BILIRUBINUR negative 03/18/2014 1014   KETONESUR 5 (A) 06/18/2020 2200   PROTEINUR NEGATIVE 06/18/2020 2200   UROBILINOGEN 1.0 03/31/2014 1228   NITRITE NEGATIVE 06/18/2020 2200   LEUKOCYTESUR NEGATIVE 06/18/2020 2200   Sepsis Labs: Invalid input(s): PROCALCITONIN, [place]  Microbiology: Recent Results (from the past 240 hour(s))  Respiratory Panel by RT PCR (Flu A&B, Covid) - Nasopharyngeal Swab     Status: None   Collection Time: 06/13/20 12:52 PM   Specimen: Nasopharyngeal Swab  Result Value Ref Range Status   SARS Coronavirus 2 by RT PCR NEGATIVE NEGATIVE Final    Comment: (NOTE) SARS-CoV-2 target nucleic acids are NOT DETECTED.  The SARS-CoV-2 RNA is generally detectable in upper respiratoy specimens during the acute phase of infection. The lowest concentration of SARS-CoV-2 viral copies this assay can detect is 131 copies/mL. A negative result does not preclude SARS-Cov-2 infection and should not be used as the sole basis for treatment or other patient management decisions. A negative result may occur with  improper specimen collection/handling, submission of specimen other than nasopharyngeal swab, presence of viral mutation(s) within the areas targeted by this assay, and inadequate number of viral copies (<131  copies/mL). A negative result must be combined with clinical observations, patient history, and epidemiological information. The expected result is Negative.  Fact Sheet for Patients:  PinkCheek.be  Fact Sheet for Healthcare Providers:  GravelBags.it  This test is no t yet approved or cleared by the [place] FDA and  has been authorized for detection and/or diagnosis of SARS-CoV-2 by FDA under an  Emergency Use Authorization (EUA). This EUA will remain  in effect (meaning this test can be used) for the duration of the COVID-19 declaration under Section 564(b)(1) of the Act, 21 U.S.C. section 360bbb-3(b)(1), unless the authorization is terminated or revoked sooner.     Influenza A by PCR NEGATIVE NEGATIVE Final   Influenza B by PCR NEGATIVE NEGATIVE Final    Comment: (NOTE) The Xpert Xpress SARS-CoV-2/FLU/RSV assay is intended as an aid in  the diagnosis of influenza from Nasopharyngeal swab specimens and  should not be used as a sole basis for treatment. Nasal washings and  aspirates are unacceptable for Xpert Xpress SARS-CoV-2/FLU/RSV  testing.  Fact Sheet for Patients: PinkCheek.be  Fact Sheet for Healthcare Providers: GravelBags.it  This test is not yet approved or cleared by the [place] FDA and  has been authorized for detection and/or diagnosis of SARS-CoV-2 by  FDA under an Emergency Use Authorization (EUA). This EUA will remain  in effect (meaning this test can be used) for the duration of the  Covid-19 declaration under Section 564(b)(1) of the Act, 21  U.S.C. section 360bbb-3(b)(1), unless the authorization is  terminated or revoked. Performed at [facility], [place] [address] [place], [place] [address]   Respiratory Panel by RT PCR (Flu A&B, Covid) - Nasopharyngeal Swab     Status: None   Collection Time: 06/18/20  7:43 PM    Specimen: Nasopharyngeal Swab  Result Value Ref Range Status   SARS Coronavirus 2 by RT PCR NEGATIVE NEGATIVE Final    Comment: (NOTE) SARS-CoV-2 target nucleic acids are NOT DETECTED.  The SARS-CoV-2 RNA is generally detectable in upper respiratoy specimens during the acute phase of infection. The lowest concentration of SARS-CoV-2 viral copies this assay can detect is 131 copies/mL. A negative result does not preclude SARS-Cov-2 infection and should not be used as the sole basis for treatment or other patient management decisions. A negative result may occur with  improper specimen collection/handling, submission of specimen other than nasopharyngeal swab, presence of viral mutation(s) within the areas targeted by this assay, and inadequate number of viral copies (<131 copies/mL). A negative result must be combined with clinical observations, patient history, and epidemiological information. The expected result is Negative.  Fact Sheet for Patients:  PinkCheek.be  Fact Sheet for Healthcare Providers:  GravelBags.it  This test is no t yet approved or cleared by the [place] FDA and  has been authorized for detection and/or diagnosis of SARS-CoV-2 by FDA under an Emergency Use Authorization (EUA). This EUA will remain  in effect (meaning this test can be used) for the duration of the COVID-19 declaration under Section 564(b)(1) of the Act, 21 U.S.C. section 360bbb-3(b)(1), unless the authorization is terminated or revoked sooner.     Influenza A by PCR NEGATIVE NEGATIVE Final   Influenza B by PCR NEGATIVE NEGATIVE Final    Comment: (NOTE) The Xpert Xpress SARS-CoV-2/FLU/RSV assay is intended as an aid in  the diagnosis of influenza from Nasopharyngeal swab specimens and  should not be used as a sole basis for treatment. Nasal washings and  aspirates are unacceptable for Xpert Xpress SARS-CoV-2/FLU/RSV   testing.  Fact Sheet for Patients: PinkCheek.be  Fact Sheet for Healthcare Providers: GravelBags.it  This test is not yet approved or cleared by the [place] FDA and  has been authorized for detection and/or diagnosis of SARS-CoV-2 by  FDA under an Emergency Use Authorization (EUA). This EUA will remain  in effect (meaning this test can  be used) for the duration of the  Covid-19 declaration under Section 564(b)(1) of the Act, 21  U.S.C. section 360bbb-3(b)(1), unless the authorization is  terminated or revoked. Performed at [facility], [place] [address], [place], [place] [address]     Radiology Studies: No results found.   Tramar Brueckner T. [place]  If 7PM-7AM, please contact night-coverage [email or web address] 06/21/2020, 11:38 AM

## 2020-06-21 NOTE — Progress Notes (Signed)
Physical Therapy Treatment Patient Details Name: Adrian Singh MRN: [id number] DOB: August 05, 1934 Today's Date: 06/21/2020    History of Present Illness 86 year old male with past medical history of coronary artery disease, paroxysmal atrial fibrillation on Xarelto, noninsulin-dependent diabetes mellitus type 2, recent acute stroke 04/2020, hypertension, chronic kidney disease stage IIIa and metastatic prostate cancer to bone who presents to [facility] emergency department with complaints of severe lightheadedness and weakness.  Pt admitted for orthostatic hypotension     PT Comments    Pt  Able to take a few steps today prior to onset of dizziness. Recovered once seated in chair with BP 147/78.  Will continue to follow in acute setting.   Follow Up Recommendations  SNF     Equipment Recommendations  None recommended by PT    Recommendations for Other Services       Precautions / Restrictions Precautions Precautions: Fall Precaution Comments: orthostatic hypotension Restrictions Weight Bearing Restrictions: No    Mobility  Bed Mobility Overal bed mobility: Needs Assistance       Supine to sit: Min assist     General bed mobility comments: Assist to manage LEs onto bed and to control trunk  Transfers Overall transfer level: Needs assistance Equipment used: Rolling walker (2 wheeled) Transfers: Sit to/from Stand Sit to Stand: Min assist         General transfer comment: assist to rise and transition to RW, mild dizziness in standing   Ambulation/Gait Ambulation/Gait assistance: Min assist;+2 safety/equipment Gait Distance (Feet): 6 Feet Assistive device: Rolling walker (2 wheeled) Gait Pattern/deviations: Step-to pattern Gait velocity: decr   General Gait Details: assist for balance and Rw positioning, mild dizziness after 6", requiring seated rest    Stairs             Wheelchair Mobility    Modified Rankin (Stroke Patients Only)        Balance   Sitting-balance support: No upper extremity supported;Feet supported Sitting balance-Leahy Scale: Fair     Standing balance support: Bilateral upper extremity supported Standing balance-Leahy Scale: Poor Standing balance comment: reliant on UEs and support for safety                             Cognition Arousal/Alertness: Awake/alert Behavior During Therapy: WFL for tasks assessed/performed Overall Cognitive Status: Within Functional Limits for tasks assessed                                 General Comments: very pleasant and cooperative      Exercises General Exercises - Lower Extremity Ankle Circles/Pumps: AROM;Both;Supine;20 reps Quad Sets: AROM;Both;10 reps Heel Slides: AROM;10 reps;Both    General Comments        Pertinent Vitals/Pain Pain Assessment: No/denies pain    Home Living                      Prior Function            PT Goals (current goals can now be found in the care plan section) Acute Rehab PT Goals Patient Stated Goal: Regain IND PT Goal Formulation: With patient Time For Goal Achievement: 07/03/20 Potential to Achieve Goals: Fair Progress towards PT goals: Progressing toward goals    Frequency    Min 3X/week      PT Plan Current plan remains appropriate    Co-evaluation  AM-PAC PT "6 Clicks" Mobility   Outcome Measure  Help needed turning from your back to your side while in a flat bed without using bedrails?: A Little Help needed moving from lying on your back to sitting on the side of a flat bed without using bedrails?: A Little Help needed moving to and from a bed to a chair (including a wheelchair)?: A Little Help needed standing up from a chair using your arms (e.g., wheelchair or bedside chair)?: A Little Help needed to walk in hospital room?: A Lot Help needed climbing 3-5 steps with a railing? : Total 6 Click Score: 15    End of Session Equipment Utilized  During Treatment: Gait belt Activity Tolerance: Patient tolerated treatment well Patient left: in bed;with call bell/phone within reach;with chair alarm set Nurse Communication: Mobility status PT Visit Diagnosis: History of falling (Z91.81);Muscle weakness (generalized) (M62.81);Unsteadiness on feet (R26.81);Difficulty in walking, not elsewhere classified (R26.2)     Time: 5110-2111 PT Time Calculation (min) (ACUTE ONLY): 19 min  Charges:  $Therapeutic Activity: 8-22 mins                     Baxter Flattery, PT  Acute Rehab Dept ([place]) [phone] Pager [phone]  06/21/2020    [facility] 06/21/2020, 2:03 PM

## 2020-06-21 NOTE — NC FL2 (Signed)
[place] LEVEL OF CARE SCREENING TOOL     IDENTIFICATION  Patient Name: Adrian Singh Birthdate: 05-May-1934 Sex: male Admission Date (Current Location): 06/18/2020  [facility] and [place] Number:  Herbalist and Address:  [facility],  [place] [address] [place]      Provider Number: [id number]  Attending Physician Name and Address:  Mercy Riding, MD  Relative Name and Phone Number:       Current Level of Care: Hospital Recommended Level of Care: [place] Prior Approval Number:    Date Approved/Denied:   PASRR Number: [id number]  Discharge Plan: SNF    Current Diagnoses: Patient Active Problem List   Diagnosis Date Noted  . Postural dizziness with presyncope 06/09/2020  . Protein-calorie malnutrition, severe 05/06/2020  . Palliative care by specialist   . Goals of care, counseling/discussion   . DNR (do not resuscitate) discussion   . FTT (failure to thrive) in adult   . Need for emotional support   . Pressure injury of skin 05/04/2020  . Stroke ([place]) 05/02/2020  . Generalized weakness 04/30/2020  . Fall at home, initial encounter 04/30/2020  . Acute on chronic anemia 04/30/2020  . Elevated CK 04/30/2020  . Chronic kidney disease, stage 3a ([place]) 04/30/2020  . Elevated troponin level not due myocardial infarction 04/30/2020  . Fatigue 04/29/2020  . Malnutrition of mild degree ([place]) 04/06/2019  . Diabetic foot ulcer ([place]) 04/06/2019  . Mood disorder ([place]) 04/06/2019  . Aortic atherosclerosis ([place]) 02/13/2017  . Prostate cancer ([place]) 02/13/2017  . Primary malignant neoplasm of prostate metastatic to bone ([place]) 02/13/2017  . Orthostatic hypotension 03/19/2016  . CAD S/P percutaneous coronary angioplasty 02/28/2016  . Atherosclerosis of native coronary artery of native heart with angina pectoris ([place])   . Abnormal nuclear stress test 09/22/2015  . Thrombocytopenia ([place])   . Advanced directives,  counseling/discussion 09/27/2014  . PAF (paroxysmal atrial fibrillation) ([place]) 04/12/2014  . History of CVA (cerebrovascular accident) 03/31/2014  . CKD stage 3 due to type 2 diabetes mellitus ([place])   . Uncontrolled type 2 diabetes mellitus with diabetic polyneuropathy, without long-term current use of insulin ([place]) 11/05/2011  . Routine general medical examination at a health care facility 05/07/2011  . Essential hypertension 12/23/2009  . GERD 12/23/2009  . BPH (benign prostatic hyperplasia) 12/23/2009  . OSTEOARTHRITIS 12/23/2009    Orientation RESPIRATION BLADDER Height & Weight     Time, Self, Situation, Place  Normal Incontinent Weight: 145 lb (65.8 kg) Height:  5\' 10"  (177.8 cm)  BEHAVIORAL SYMPTOMS/MOOD NEUROLOGICAL BOWEL NUTRITION STATUS      Continent    AMBULATORY STATUS COMMUNICATION OF NEEDS Skin   Extensive Assist Verbally Normal                       Personal Care Assistance Level of Assistance  Bathing, Dressing Bathing Assistance: Limited assistance Feeding assistance: Limited assistance Dressing Assistance: Limited assistance     Functional Limitations Info             SPECIAL CARE FACTORS FREQUENCY  PT (By licensed PT), OT (By licensed OT)     PT Frequency: 5x/wk OT Frequency: 5x/wk            Contractures Contractures Info: Not present    Additional Factors Info  Code Status, Allergies Code Status Info: Full Allergies Info: see MAR           Current Medications (06/21/2020):  This is the current hospital active medication list Current Facility-Administered Medications  Medication Dose Route Frequency Provider Last Rate Last Admin  . acetaminophen (TYLENOL) tablet 500 mg  500 mg Oral Q8H Wendee Beavers T, MD   500 mg at 06/21/20 0507  . atorvastatin (LIPITOR) tablet 20 mg  20 mg Oral QHS Jennette Kettle M, DO   20 mg at 06/20/20 2239  . calcium carbonate (OS-CAL - dosed in mg of elemental calcium) tablet 1,250 mg  1,250 mg Oral Q  breakfast Etta Quill, DO   1,250 mg at 06/21/20 0846  . feeding supplement (ENSURE ENLIVE) (ENSURE ENLIVE) liquid 237 mL  237 mL Oral BID BM Gonfa, Taye T, MD   237 mL at 06/20/20 1425  . feeding supplement (PRO-STAT SUGAR FREE 64) liquid 30 mL  30 mL Oral BID Jennette Kettle M, DO   30 mL at 06/21/20 0953  . insulin aspart (novoLOG) injection 0-5 Units  0-5 Units Subcutaneous QHS Gonfa, Taye T, MD      . insulin aspart (novoLOG) injection 0-9 Units  0-9 Units Subcutaneous TID WC Mercy Riding, MD   2 Units at 06/21/20 1149  . lactated ringers infusion   Intravenous Continuous Mercy Riding, MD 50 mL/hr at 06/21/20 0158 New Bag at 06/21/20 0158  . loperamide (IMODIUM) capsule 2 mg  2 mg Oral PRN Wendee Beavers T, MD   2 mg at 06/19/20 2200  . metoprolol tartrate (LOPRESSOR) tablet 12.5 mg  12.5 mg Oral BID Wendee Beavers T, MD   12.5 mg at 06/21/20 0953  . multivitamin with minerals tablet 1 tablet  1 tablet Oral Daily Etta Quill, DO   1 tablet at 06/21/20 [phone]  . ondansetron (ZOFRAN) tablet 4 mg  4 mg Oral Q6H PRN Etta Quill, DO       Or  . ondansetron [facility]) injection 4 mg  4 mg Intravenous Q6H PRN Etta Quill, DO      . rivaroxaban [name]) tablet 20 mg  20 mg Oral Q supper Etta Quill, DO   20 mg at 06/20/20 1613  . traMADol (ULTRAM) tablet 50 mg  50 mg Oral Q12H PRN Mercy Riding, MD         Discharge Medications: Please see discharge summary for a list of discharge medications.  Relevant Imaging Results:  Relevant Lab Results:   Additional Information SSN: [id number]  Romulo Okray, LCSW

## 2020-06-22 LAB — GLUCOSE, CAPILLARY
Glucose-Capillary: 128 mg/dL — ABNORMAL HIGH (ref 70–99)
Glucose-Capillary: 182 mg/dL — ABNORMAL HIGH (ref 70–99)
Glucose-Capillary: 159 mg/dL — ABNORMAL HIGH (ref 70–99)
Glucose-Capillary: 141 mg/dL — ABNORMAL HIGH (ref 70–99)

## 2020-06-22 LAB — HEMOGLOBIN A1C
Hgb A1c MFr Bld: 6.5 % — ABNORMAL HIGH (ref 4.8–5.6)
Mean Plasma Glucose: 139.85 mg/dL

## 2020-06-22 MED ORDER — SODIUM CHLORIDE 1 G PO TABS
1.0000 g | ORAL_TABLET | Freq: Two times a day (BID) | ORAL | Status: DC
  Administered 2020-06-22 – 2020-06-24 (×4): 1 g via ORAL
  Filled 2020-06-22 (×6): qty 1

## 2020-06-22 NOTE — Progress Notes (Signed)
Patient felt dizzy and light headed during sitting on the side of the bed and standing for orthostatic vitals.

## 2020-06-22 NOTE — Progress Notes (Signed)
PROGRESS NOTE    Adrian Singh  ZOX:[id number] DOB: January 08, 1934 DOA: 06/18/2020 PCP: [name], MD   Chief Complaint  Patient presents with  . Weakness  . Hypotension    Brief Narrative:  86 year old male with history of diastolic CHF, DM-2, metastatic prostate cancer, A. fib on Xarelto, CVA with residual dysarthria, nonobstructive CAD, debility, and recent hospitalization 9/23-9/27 for orthostatic hypotension returning with presyncope and orthostatic hypotension.  At that time, it was felt to be due to diuretics, Cardura and poor p.o. intake but the medications were resumed on discharge to SNF. Per review of MAR from NH, Flomax was added, and patient has been getting Flomax and Cardura.   In ED, orthostatic vitals remained positive despite bolus IV fluids.  The decision was made to admit for observation.  CMP without significant finding.  Hgb 8.0 (about baseline  Patient hospitalized for further management being treated with IV fluids, home Flomax and Cardura has been discontinued.  Subjective:  Orthostatics positive overnight on standing blood pressure 77/40: Symptomatic Alert,awake. Daughter at bedside.  Assessment & Plan:  Orthostatic hypotension/presyncope: Still symptomatic and Orthostatic despite stopping Cardura, Flomax, compression stockings.  On IV fluids RL 50 ml/hr. Cont ivf, add abdominal binder  Normocytic anemia: Hemoglobin stable at baseline milligrams range.  Essential hypertension/history of CVA/PAF: In normal sinus rhythm, residual dysarthria and RLE weakness.  On Xarelto, metoprolol 12.5 mg twice daily.  Blood pressure is orthostatic-SEE #1.  History of metastatic prostate cancer on Xtandi:Cardura at home. cardura stopped, cont home meds  Diarrhea: Seems to have subsided.  Uncontrolled type 2 diabetes mellitus with diabetic polyneuropathy, without long-term current use of insulin ,hba1c stable 6.5.  Keep on SSI. Recent Labs  Lab 06/20/20 2015  06/21/20 0736 06/21/20 1135 06/21/20 1645 06/21/20 2103  GLUCAP 190* 104* 170* 211* 154*   Severe protein calorie malnutrition:Augment diet Spoke with nursing staff.  DVT prophylaxis: rivaroxaban (XARELTO) tablet 20 mg Start: 06/19/20 1700 Place TED hose Start: 06/19/20 0806 Code Status:   Code Status: Full Code Family Communication: plan of care discussed with patient and daughter at bedside.  Status is: Inpatient Remains inpatient appropriate because:Inpatient level of care appropriate due to severity of illness and Due to ongoing symptomatic orthostatic hypotension  Dispo: The patient is from: SNF              Anticipated d/c is to: SNF              Anticipated d/c date is: 1 day              Patient currently is not medically stable to d/c.  Nutrition: Diet Order            Diet heart healthy/carb modified Room service appropriate? Yes; Fluid consistency: Thin  Diet effective now                 Nutrition Problem: Severe Malnutrition Etiology: chronic illness, cancer and cancer related treatments Signs/Symptoms: percent weight loss, severe fat depletion, severe muscle depletion Percent weight loss: 22 % (x 6 months) Interventions: Ensure Enlive (each supplement provides 350kcal and 20 grams of protein), Prostat, MVI, Magic cup Body mass index is 20.81 kg/m.  Consultants:see note  Procedures:see note Microbiology:see note Blood Culture    Component Value Date/Time   SDES  06/10/2020 1012    BLOOD RIGHT HAND Performed at [facility], [place] [address], [place], [place] [address]    SPECREQUEST  06/10/2020 1012    BOTTLES  DRAWN AEROBIC AND ANAEROBIC Blood Culture adequate volume Performed at [facility] [address], [place], [place] [address]    CULT  06/10/2020 1012    NO GROWTH 5 DAYS Performed at [facility] [address] [place], [place] [address]    REPTSTATUS 06/15/2020 FINAL 06/10/2020 1012    Other  culture-see note  Medications: Scheduled Meds: . acetaminophen  1,000 mg Oral Q8H  . atorvastatin  20 mg Oral QHS  . calcium carbonate  1,250 mg Oral Q breakfast  . diclofenac Sodium  2 g Topical QID  . feeding supplement (ENSURE ENLIVE)  237 mL Oral BID BM  . feeding supplement (PRO-STAT SUGAR FREE 64)  30 mL Oral BID  . insulin aspart  0-5 Units Subcutaneous QHS  . insulin aspart  0-9 Units Subcutaneous TID WC  . metoprolol tartrate  12.5 mg Oral BID  . multivitamin with minerals  1 tablet Oral Daily  . rivaroxaban  20 mg Oral Q supper   Continuous Infusions: . lactated ringers 50 mL/hr at 06/21/20 1726    Antimicrobials: Anti-infectives (From admission, onward)   None     Objective: Vitals: Today's Vitals   06/22/20 0409 06/22/20 0550 06/22/20 0558 06/22/20 0608  BP:  (!) 155/74 117/66   Pulse:  69 70   Resp:  16    Temp:  (!) 97.5 F (36.4 C)    TempSrc:  Oral    SpO2:  100%    Weight:      Height:      PainSc: 0-No pain   0-No pain    Intake/Output Summary (Last 24 hours) at 06/22/2020 0739 Last data filed at 06/22/2020 0622 Gross per 24 hour  Intake 1408.2 ml  Output 4275 ml  Net -2866.8 ml   Filed Weights   06/18/20 2023  Weight: 65.8 kg   Weight change:   Intake/Output from previous day: 10/05 0701 - 10/06 0700 In: 1408.2 [P.O.:880; I.V.:528.2] Out: 4275 [Urine:4275] Intake/Output this shift: No intake/output data recorded. Examination: General exam: AAO,weak,NAD, weak appearing. HEENT:Oral mucosa moist, Ear/Nose WNL grossly,dentition normal. Respiratory system: bilaterally clear,no wheezing or crackles,no use of accessory muscle, non tender. Cardiovascular system: S1 & S2 +, regular, No JVD. Gastrointestinal system: Abdomen soft, NT,ND, BS+. Nervous System:Alert, awake, moving extremities and grossly nonfocal Extremities: No edema, distal peripheral pulses palpable.  Skin: No rashes,no icterus. MSK: Normal muscle bulk,tone, power  Data  Reviewed: I have personally reviewed following labs and imaging studies CBC: Recent Labs  Lab 06/18/20 1942 06/19/20 0500 06/20/20 0405 06/21/20 0331 06/21/20 1159  WBC 9.2 7.8 6.7  --   --   NEUTROABS 7.3  --   --   --   --   HGB 8.0* 7.9* 8.2* 8.4* 8.3*  HCT 26.0* 26.6* 27.7* 28.6* 28.3*  MCV 78.3* 79.9* 79.8*  --   --   PLT 206 194 204  --   --    Basic Metabolic Panel: Recent Labs  Lab 06/18/20 1942 06/19/20 0500 06/19/20 0830 06/20/20 0405  NA 140 140  --  140  K 3.6 3.3*  --  3.8  CL 106 107  --  109  CO2 22 21*  --  23  GLUCOSE 150* 108*  --  70  BUN 13 13  --  13  CREATININE 0.76 0.66  --  0.60*  CALCIUM 7.5* 7.1*  --  7.5*  MG 2.0  --  1.9 1.8  PHOS  --   --   --  2.1*   GFR: Estimated Creatinine Clearance: 65.1 mL/min (A) (by C-G formula based on SCr of 0.6 mg/dL (L)). Liver Function Tests: Recent Labs  Lab 06/18/20 1942 06/20/20 0405  AST 38  --   ALT 21  --   ALKPHOS 346*  --   BILITOT 0.9  --   PROT 6.5  --   ALBUMIN 2.0* 1.7*   No results for input(s): LIPASE, AMYLASE in the last 168 hours. No results for input(s): AMMONIA in the last 168 hours. Coagulation Profile: No results for input(s): INR, PROTIME in the last 168 hours. Cardiac Enzymes: No results for input(s): CKTOTAL, CKMB, CKMBINDEX, TROPONINI in the last 168 hours. BNP (last 3 results) No results for input(s): PROBNP in the last 8760 hours. HbA1C: Recent Labs    06/22/20 0335  HGBA1C 6.5*   CBG: Recent Labs  Lab 06/20/20 2015 06/21/20 0736 06/21/20 1135 06/21/20 1645 06/21/20 2103  GLUCAP 190* 104* 170* 211* 154*   Lipid Profile: No results for input(s): CHOL, HDL, LDLCALC, TRIG, CHOLHDL, LDLDIRECT in the last 72 hours. Thyroid Function Tests: Recent Labs    06/19/20 1255  TSH 4.671*   Anemia Panel: Recent Labs    06/19/20 0905 06/19/20 1255  VITAMINB12 1,048*  --   FOLATE  --  15.3  FERRITIN 1,297*  --   TIBC 133*  --   IRON 40*  --   RETICCTPCT  --   1.3   Sepsis Labs: No results for input(s): PROCALCITON, LATICACIDVEN in the last 168 hours.  Recent Results (from the past 240 hour(s))  Respiratory Panel by RT PCR (Flu A&B, Covid) - Nasopharyngeal Swab     Status: None   Collection Time: 06/13/20 12:52 PM   Specimen: Nasopharyngeal Swab  Result Value Ref Range Status   SARS Coronavirus 2 by RT PCR NEGATIVE NEGATIVE Final    Comment: (NOTE) SARS-CoV-2 target nucleic acids are NOT DETECTED.  The SARS-CoV-2 RNA is generally detectable in upper respiratoy specimens during the acute phase of infection. The lowest concentration of SARS-CoV-2 viral copies this assay can detect is 131 copies/mL. A negative result does not preclude SARS-Cov-2 infection and should not be used as the sole basis for treatment or other patient management decisions. A negative result may occur with  improper specimen collection/handling, submission of specimen other than nasopharyngeal swab, presence of viral mutation(s) within the areas targeted by this assay, and inadequate number of viral copies (<131 copies/mL). A negative result must be combined with clinical observations, patient history, and epidemiological information. The expected result is Negative.  Fact Sheet for Patients:  PinkCheek.be  Fact Sheet for Healthcare Providers:  GravelBags.it  This test is no t yet approved or cleared by the [place] FDA and  has been authorized for detection and/or diagnosis of SARS-CoV-2 by FDA under an Emergency Use Authorization (EUA). This EUA will remain  in effect (meaning this test can be used) for the duration of the COVID-19 declaration under Section 564(b)(1) of the Act, 21 U.S.C. section 360bbb-3(b)(1), unless the authorization is terminated or revoked sooner.     Influenza A by PCR NEGATIVE NEGATIVE Final   Influenza B by PCR NEGATIVE NEGATIVE Final    Comment: (NOTE) The Xpert Xpress  SARS-CoV-2/FLU/RSV assay is intended as an aid in  the diagnosis of influenza from Nasopharyngeal swab specimens and  should not be used as a sole basis for treatment. Nasal washings and  aspirates are unacceptable for Xpert Xpress SARS-CoV-2/FLU/RSV  testing.  Fact Sheet  for Patients: PinkCheek.be  Fact Sheet for Healthcare Providers: GravelBags.it  This test is not yet approved or cleared by the [place] FDA and  has been authorized for detection and/or diagnosis of SARS-CoV-2 by  FDA under an Emergency Use Authorization (EUA). This EUA will remain  in effect (meaning this test can be used) for the duration of the  Covid-19 declaration under Section 564(b)(1) of the Act, 21  U.S.C. section 360bbb-3(b)(1), unless the authorization is  terminated or revoked. Performed at [facility], [place] [address], [place], [place] [address]   Respiratory Panel by RT PCR (Flu A&B, Covid) - Nasopharyngeal Swab     Status: None   Collection Time: 06/18/20  7:43 PM   Specimen: Nasopharyngeal Swab  Result Value Ref Range Status   SARS Coronavirus 2 by RT PCR NEGATIVE NEGATIVE Final    Comment: (NOTE) SARS-CoV-2 target nucleic acids are NOT DETECTED.  The SARS-CoV-2 RNA is generally detectable in upper respiratoy specimens during the acute phase of infection. The lowest concentration of SARS-CoV-2 viral copies this assay can detect is 131 copies/mL. A negative result does not preclude SARS-Cov-2 infection and should not be used as the sole basis for treatment or other patient management decisions. A negative result may occur with  improper specimen collection/handling, submission of specimen other than nasopharyngeal swab, presence of viral mutation(s) within the areas targeted by this assay, and inadequate number of viral copies (<131 copies/mL). A negative result must be combined with clinical observations, patient  history, and epidemiological information. The expected result is Negative.  Fact Sheet for Patients:  PinkCheek.be  Fact Sheet for Healthcare Providers:  GravelBags.it  This test is no t yet approved or cleared by the [place] FDA and  has been authorized for detection and/or diagnosis of SARS-CoV-2 by FDA under an Emergency Use Authorization (EUA). This EUA will remain  in effect (meaning this test can be used) for the duration of the COVID-19 declaration under Section 564(b)(1) of the Act, 21 U.S.C. section 360bbb-3(b)(1), unless the authorization is terminated or revoked sooner.     Influenza A by PCR NEGATIVE NEGATIVE Final   Influenza B by PCR NEGATIVE NEGATIVE Final    Comment: (NOTE) The Xpert Xpress SARS-CoV-2/FLU/RSV assay is intended as an aid in  the diagnosis of influenza from Nasopharyngeal swab specimens and  should not be used as a sole basis for treatment. Nasal washings and  aspirates are unacceptable for Xpert Xpress SARS-CoV-2/FLU/RSV  testing.  Fact Sheet for Patients: PinkCheek.be  Fact Sheet for Healthcare Providers: GravelBags.it  This test is not yet approved or cleared by the [place] FDA and  has been authorized for detection and/or diagnosis of SARS-CoV-2 by  FDA under an Emergency Use Authorization (EUA). This EUA will remain  in effect (meaning this test can be used) for the duration of the  Covid-19 declaration under Section 564(b)(1) of the Act, 21  U.S.C. section 360bbb-3(b)(1), unless the authorization is  terminated or revoked. Performed at [facility], [place] [address], [place], [place] [address]   SARS Coronavirus 2 by RT PCR (hospital order, performed in [facility] hospital lab) Nasopharyngeal Nasopharyngeal Swab     Status: None   Collection Time: 06/21/20  1:07 PM   Specimen: Nasopharyngeal  Swab  Result Value Ref Range Status   SARS Coronavirus 2 NEGATIVE NEGATIVE Final    Comment: (NOTE) SARS-CoV-2 target nucleic acids are NOT DETECTED.  The SARS-CoV-2 RNA is generally detectable in upper  and lower respiratory specimens during the acute phase of infection. The lowest concentration of SARS-CoV-2 viral copies this assay can detect is 250 copies / mL. A negative result does not preclude SARS-CoV-2 infection and should not be used as the sole basis for treatment or other patient management decisions.  A negative result may occur with improper specimen collection / handling, submission of specimen other than nasopharyngeal swab, presence of viral mutation(s) within the areas targeted by this assay, and inadequate number of viral copies (<250 copies / mL). A negative result must be combined with clinical observations, patient history, and epidemiological information.  Fact Sheet for Patients:   StrictlyIdeas.no  Fact Sheet for Healthcare Providers: BankingDealers.co.za  This test is not yet approved or  cleared by the [place] FDA and has been authorized for detection and/or diagnosis of SARS-CoV-2 by FDA under an Emergency Use Authorization (EUA).  This EUA will remain in effect (meaning this test can be used) for the duration of the COVID-19 declaration under Section 564(b)(1) of the Act, 21 U.S.C. section 360bbb-3(b)(1), unless the authorization is terminated or revoked sooner.  Performed at [facility], [place] [address] [place], [place] [address]      Radiology Studies: No results found.   LOS: 3 days   Antonieta Pert, MD [facility]  06/22/2020, 7:39 AM

## 2020-06-23 LAB — GLUCOSE, CAPILLARY
Glucose-Capillary: 151 mg/dL — ABNORMAL HIGH (ref 70–99)
Glucose-Capillary: 177 mg/dL — ABNORMAL HIGH (ref 70–99)
Glucose-Capillary: 109 mg/dL — ABNORMAL HIGH (ref 70–99)
Glucose-Capillary: 154 mg/dL — ABNORMAL HIGH (ref 70–99)

## 2020-06-23 LAB — CBC
HCT: 28.4 % — ABNORMAL LOW (ref 39.0–52.0)
Hemoglobin: 8.2 g/dL — ABNORMAL LOW (ref 13.0–17.0)
MCH: 23.8 pg — ABNORMAL LOW (ref 26.0–34.0)
MCHC: 28.9 g/dL — ABNORMAL LOW (ref 30.0–36.0)
MCV: 82.6 fL (ref 80.0–100.0)
Platelets: 187 10*3/uL (ref 150–400)
RBC: 3.44 MIL/uL — ABNORMAL LOW (ref 4.22–5.81)
RDW: 21.2 % — ABNORMAL HIGH (ref 11.5–15.5)
WBC: 6.7 10*3/uL (ref 4.0–10.5)
nRBC: 0.4 % — ABNORMAL HIGH (ref 0.0–0.2)

## 2020-06-23 LAB — BASIC METABOLIC PANEL
Anion gap: 6 (ref 5–15)
BUN: 18 mg/dL (ref 8–23)
CO2: 28 mmol/L (ref 22–32)
Calcium: 7.8 mg/dL — ABNORMAL LOW (ref 8.9–10.3)
Chloride: 103 mmol/L (ref 98–111)
Creatinine, Ser: 0.7 mg/dL (ref 0.61–1.24)
GFR calc non Af Amer: >60 mL/min (ref >60)
Glucose, Bld: 161 mg/dL — ABNORMAL HIGH (ref 70–99)
Potassium: 4.1 mmol/L (ref 3.5–5.1)
Sodium: 137 mmol/L (ref 135–145)

## 2020-06-23 MED ORDER — LACTATED RINGERS IV SOLN: INTRAVENOUS | Status: DC

## 2020-06-23 NOTE — Progress Notes (Signed)
Hydrologist [facility])  Hospital Liaison: RN note         Notified by [facility] manager of patient/family request for [facility] Palliative services at [facility] after discharge.             [place] Palliative team will follow up with patient after discharge.         Please call with any hospice or palliative related questions.         Thank you for this referral.         Farrel Gordon, RN, CCM  [place] (listed on [place] under Hospice[facility])    [phone]

## 2020-06-23 NOTE — Progress Notes (Signed)
Physical Therapy Treatment Patient Details Name: Adrian Singh MRN: [id number] DOB: 1934-06-02 Today's Date: 06/23/2020    History of Present Illness 86 year old male with past medical history of coronary artery disease, paroxysmal atrial fibrillation on Xarelto, noninsulin-dependent diabetes mellitus type 2, recent acute stroke 04/2020, hypertension, chronic kidney disease stage IIIa and metastatic prostate cancer to bone who presents to [facility] emergency department with complaints of severe lightheadedness and weakness.  Pt admitted for orthostatic hypotension     PT Comments    Pt very cooperative but progressing slowly with mobility 2* continues postural hypotension.  Treatment focused on monitoring orthostatic hypotension improving activity tolerance in order to participate in functional mobility and ADLs. Patient supine in bed when therapist entered the room without complaints of dizziness. Patient able to bridge in bed to allow therapist to position abdominal binder. Patient mod assist to transfer to side of bed and minimal complaints of dizziness. Patient performed upper and lower extremity exercises at edge of bed to attempt to increase BP before standing.  Patient able to stand and take steps in front of recliner. Patient able to take steps and then side steps with min assist and RW to stand in front of sink but needed sitting rest break prior to grooming task. Patient able to stand at sink to wash face initially with min assist for safety - but then requiring increasing support due to fatigue and buckling knees. Demonstrating improving orthostatic hypotension and increased activity tolerance. Vital signs are as follows:Supine 167/76 Sitting 107/67  Sitting after exercises: 113/63 Standing: 96/51 Prolonged standing: 106/86 Sitting after steps to sink: 109/64 Sitting after standing at sink: 99/62  Follow Up Recommendations  SNF     Equipment Recommendations  None  recommended by PT    Recommendations for Other Services OT consult     Precautions / Restrictions Precautions Precautions: Fall Precaution Comments: orthostatic hypotension Restrictions Weight Bearing Restrictions: No    Mobility  Bed Mobility Overal bed mobility: Needs Assistance Bed Mobility: Supine to Sit     Supine to sit: Mod assist     General bed mobility comments: Assist for LEs and trunk lift off.  Transfers Overall transfer level: Needs assistance Equipment used: Rolling walker (2 wheeled) Transfers: Sit to/from Stand Sit to Stand: Min assist Stand pivot transfers: Min assist;From elevated surface       General transfer comment: Min assist to stand. Min assist to take steps to recliner. Increasing support required with standing at sink.  Ambulation/Gait Ambulation/Gait assistance: Min assist;+2 safety/equipment Gait Distance (Feet): 6 Feet Assistive device: Rolling walker (2 wheeled) Gait Pattern/deviations: Step-to pattern Gait velocity: decr   General Gait Details: assist for balance and Rw positioning, mild dizziness after 6", requiring seated rest    Stairs             Wheelchair Mobility    Modified Rankin (Stroke Patients Only)       Balance Overall balance assessment: Needs assistance;History of Falls Sitting-balance support: No upper extremity supported;Feet supported Sitting balance-Leahy Scale: Fair     Standing balance support: Bilateral upper extremity supported Standing balance-Leahy Scale: Poor Standing balance comment: reliant on UEs and support for safety                             Cognition Arousal/Alertness: Awake/alert Behavior During Therapy: WFL for tasks assessed/performed Overall Cognitive Status: Within Functional Limits for tasks assessed  General Comments: very pleasant and cooperative      Exercises General Exercises - Lower Extremity Ankle  Circles/Pumps: AROM;Both;Supine;15 reps Other Exercises Other Exercises: BUE exercises to attempt to improve BP prior to standing. Other Exercises: Education to perform arm raises and ankle pumps while in chair every 30 min    General Comments        Pertinent Vitals/Pain Pain Assessment: No/denies pain    Home Living                      Prior Function            PT Goals (current goals can now be found in the care plan section) Acute Rehab PT Goals Patient Stated Goal: Regain IND PT Goal Formulation: With patient Time For Goal Achievement: 07/03/20 Potential to Achieve Goals: Fair Progress towards PT goals: Progressing toward goals    Frequency    Min 3X/week      PT Plan Current plan remains appropriate    Co-evaluation PT/OT/SLP Co-Evaluation/Treatment: Yes Reason for Co-Treatment: For patient/therapist safety PT goals addressed during session: Mobility/safety with mobility OT goals addressed during session: ADL's and self-care      AM-PAC PT "6 Clicks" Mobility   Outcome Measure  Help needed turning from your back to your side while in a flat bed without using bedrails?: A Little Help needed moving from lying on your back to sitting on the side of a flat bed without using bedrails?: A Little Help needed moving to and from a bed to a chair (including a wheelchair)?: A Little Help needed standing up from a chair using your arms (e.g., wheelchair or bedside chair)?: A Little Help needed to walk in hospital room?: A Lot Help needed climbing 3-5 steps with a railing? : Total 6 Click Score: 15    End of Session Equipment Utilized During Treatment: Gait belt Activity Tolerance: Patient tolerated treatment well;Patient limited by fatigue Patient left: in chair;with call bell/phone within reach;with chair alarm set Nurse Communication: Mobility status PT Visit Diagnosis: History of falling (Z91.81);Muscle weakness (generalized) (M62.81);Unsteadiness on  feet (R26.81);Difficulty in walking, not elsewhere classified (R26.2)     Time: 2263-3354 PT Time Calculation (min) (ACUTE ONLY): 31 min  Charges:  $Gait Training: 8-22 mins                     [place] Pager [phone] Office [phone]    Eino Whitner 06/23/2020, 1:00 PM

## 2020-06-23 NOTE — TOC Progression Note (Signed)
Transition of Care [facility]) - Progression Note    Patient Details  Name: Adrian Singh MRN: [id number] Date of Birth: Jun 30, 1934  Transition of Care [facility]) CM/SW Contact  Lennart Pall, LCSW Phone Number: 06/23/2020, 2:52 PM  Clinical Narrative:    Anticipate pt to be medically ready for return to [facility] tomorrow - pt/family/ facility aware.  Had discussion with pt's daughter, Adrian Singh, today about possible referral to Palliative Care. She notes that her siblings had discussion and felt this would be a good resource to pursue.  Answered questions about Palliative medicine following and daughter requests a referral be made to [place] team.  Have spoken with Trixie Dredge who will follow up with family and they can follow at facility.   Expected Discharge Plan: [place] Barriers to Discharge: Continued Medical Work up  Expected Discharge Plan and Services Expected Discharge Plan: [place] In-house Referral: Clinical Social Work   Post Acute Care Choice: [place] Living arrangements for the past 2 months: [place]                 DME Arranged: N/A DME Agency: NA       HH Arranged: NA [place] Agency: NA         Social Determinants of Health (SDOH) Interventions    Readmission Risk Interventions Readmission Risk Prevention Plan 06/20/2020 06/11/2020  Transportation Screening Complete Complete  Medication Review [employer]) Complete Complete  PCP or Specialist appointment within 3-5 days of discharge Complete -  [place] or Home Care Consult Complete -  SW Recovery Care/Counseling Consult Complete Complete  Palliative Care Screening Not Applicable Not Applicable  Skilled Nursing Facility Complete Complete  Some recent data might be hidden

## 2020-06-23 NOTE — Progress Notes (Signed)
Orthostatic vitals complete. Patient reported very little dizziness today. Patient remains weak on standing.

## 2020-06-23 NOTE — Progress Notes (Signed)
PROGRESS NOTE    Adrian Singh  MWN:[id number] DOB: Aug 22, 1934 DOA: 06/18/2020 PCP: [name], MD   Chief Complaint  Patient presents with  . Weakness  . Hypotension    Brief Narrative:  86 year old male with history of diastolic CHF, DM-2, metastatic prostate cancer, A. fib on Xarelto, CVA with residual dysarthria, nonobstructive CAD, debility, and recent hospitalization 9/23-9/27 for orthostatic hypotension returning with presyncope and orthostatic hypotension.  At that time, it was felt to be due to diuretics, Cardura and poor p.o. intake but the medications were resumed on discharge to SNF. Per review of MAR from NH, Flomax was added, and patient has been getting Flomax and Cardura.   In ED, orthostatic vitals remained positive despite bolus IV fluids.  The decision was made to admit for observation.  CMP without significant finding.  Hgb 8.0 (about baseline  Patient hospitalized for further management being treated with IV fluids, home Flomax and Cardura has been discontinued.  Subjective: Seen this morning.  He feels somewhat better today.  Blood pressure did drop but not significantly.  He feels better with abdominal binder but had to be discarded as he had a bowel movement He feels exhausted and tired after physical therapy session today.  Does not feel comfortable going to skilled nursing facility today.  Assessment & Plan:  Orthostatic hypotension/presyncope: Much improved.  Currently discontinued Cardura, Flomax. Cont compression stockings, abdominal binder and gentle IV fluid hydration.  Normocytic anemia: Hemoglobin is stable at 8.2 g.  Monitor intermittently.  Essential hypertension/history of CVA/PAF: In NSR, stroke w/ residual dysarthria and RLE weakness. Cont home Xarelto, metoprolol 12.5 mg twice daily.   History of metastatic prostate cancer on Xtandi:Cardura at home. cardura stopped, cont home meds  Diarrhea:Seems to have subsided.  Uncontrolled type 2  diabetes mellitus with diabetic polyneuropathy, without long-term current use of insulin ,hba1c stable 6.5.  Keep on SSI. Recent Labs  Lab 06/22/20 1148 06/22/20 1719 06/22/20 2221 06/23/20 0739 06/23/20 1151  GLUCAP 182* 159* 141* 151* 177*   Severe protein calorie malnutrition:Augment diet.  CVA in 2021 august and abnormal brain imaging with 1.8 X1.6 to current centimeter ovoid/ T2/FLAIR hyperintense lesion in the right frontal white matter favored evolving subacute ischemic infarct, sequelae of demyelination or possibly tumor could be considered as a differential.  Had advised MRI brain in 2 to 3 months.  Daughter updated about the recommendation again.    DVT prophylaxis: rivaroxaban (XARELTO) tablet 20 mg Start: 06/19/20 1700 Place TED hose Start: 06/19/20 0806 Code Status:   Code Status: Full Code Family Communication: plan of care discussed with patient and daughter at bedside.  Status is: Inpatient Remains inpatient appropriate because:Inpatient level of care appropriate due to severity of illness and Due to ongoing symptomatic orthostatic hypotension  Dispo: The patient is from: SNF              Anticipated d/c is to: SNF              Anticipated d/c date is: 1 day              Patient currently is not medically stable to d/c.  Nutrition: Diet Order            Diet heart healthy/carb modified Room service appropriate? Yes; Fluid consistency: Thin  Diet effective now                 Nutrition Problem: Severe Malnutrition Etiology: chronic illness, cancer and cancer related treatments Signs/Symptoms:  percent weight loss, severe fat depletion, severe muscle depletion Percent weight loss: 22 % (x 6 months) Interventions: Ensure Enlive (each supplement provides 350kcal and 20 grams of protein), Prostat, MVI, Magic cup Body mass index is 20.81 kg/m.  Consultants:see note  Procedures:see note Microbiology:see note Blood Culture    Component Value Date/Time   SDES   06/10/2020 1012    BLOOD RIGHT HAND Performed at [facility], [place] [address], [place], [place] [address]    [place]  06/10/2020 1012    BOTTLES DRAWN AEROBIC AND ANAEROBIC Blood Culture adequate volume Performed at [facility] [address] [place], [place] [address]    CULT  06/10/2020 1012    NO GROWTH 5 DAYS Performed at [facility] [address], [place], [place] [address]    REPTSTATUS 06/15/2020 FINAL 06/10/2020 1012    Other culture-see note  Medications: Scheduled Meds: . acetaminophen  1,000 mg Oral Q8H  . atorvastatin  20 mg Oral QHS  . calcium carbonate  1,250 mg Oral Q breakfast  . diclofenac Sodium  2 g Topical QID  . feeding supplement (ENSURE ENLIVE)  237 mL Oral BID BM  . feeding supplement (PRO-STAT SUGAR FREE 64)  30 mL Oral BID  . insulin aspart  0-5 Units Subcutaneous QHS  . insulin aspart  0-9 Units Subcutaneous TID WC  . metoprolol tartrate  12.5 mg Oral BID  . multivitamin with minerals  1 tablet Oral Daily  . rivaroxaban  20 mg Oral Q supper  . sodium chloride  1 g Oral BID WC   Continuous Infusions:   Antimicrobials: Anti-infectives (From admission, onward)   None     Objective: Vitals: Today's Vitals   06/22/20 2341 06/23/20 0347 06/23/20 0356 06/23/20 1214  BP:  (!) 172/76    Pulse:  73    Resp:  18    Temp:  98 F (36.7 C)    TempSrc:  Oral    SpO2:  97%    Weight:      Height:      PainSc: 0-No pain  0-No pain 5     Intake/Output Summary (Last 24 hours) at 06/23/2020 1316 Last data filed at 06/23/2020 0900 Gross per 24 hour  Intake 800 ml  Output 1852 ml  Net -1052 ml   Filed Weights   06/18/20 2023  Weight: 65.8 kg   Weight change:   Intake/Output from previous day: 10/06 0701 - 10/07 0700 In: 960 [P.O.:960] Out: 1752 [Urine:1751; Stool:1] Intake/Output this shift: Total I/O In: 200 [P.O.:200] Out: 400 [Urine:400] Examination: General exam: AAOx3, weak,NAD,  weak appearing. HEENT:Oral mucosa moist, Ear/Nose WNL grossly, dentition normal. Respiratory system: bilaterally clear,no wheezing or crackles,no use of accessory muscle Cardiovascular system: S1 & S2 +, No JVD,. Gastrointestinal system: Abdomen soft, NT,ND, BS+ Nervous System:Alert, awake, moving extremities and grossly nonfocal Extremities: No edema, distal peripheral pulses palpable.  Skin: No rashes,no icterus. MSK: Normal muscle bulk,tone, power  Data Reviewed: I have personally reviewed following labs and imaging studies CBC: Recent Labs  Lab 06/18/20 1942 06/18/20 1942 06/19/20 0500 06/20/20 0405 06/21/20 0331 06/21/20 1159 06/23/20 0314  WBC 9.2  --  7.8 6.7  --   --  6.7  NEUTROABS 7.3  --   --   --   --   --   --   HGB 8.0*   < > 7.9* 8.2* 8.4* 8.3* 8.2*  HCT 26.0*   < > 26.6* 27.7* 28.6*  28.3* 28.4*  MCV 78.3*  --  79.9* 79.8*  --   --  82.6  PLT 206  --  194 204  --   --  187   < > = values in this interval not displayed.   Basic Metabolic Panel: Recent Labs  Lab 06/18/20 1942 06/19/20 0500 06/19/20 0830 06/20/20 0405 06/23/20 0314  NA 140 140  --  140 137  K 3.6 3.3*  --  3.8 4.1  CL 106 107  --  109 103  CO2 22 21*  --  23 28  GLUCOSE 150* 108*  --  70 161*  BUN 13 13  --  13 18  CREATININE 0.76 0.66  --  0.60* 0.70  CALCIUM 7.5* 7.1*  --  7.5* 7.8*  MG 2.0  --  1.9 1.8  --   PHOS  --   --   --  2.1*  --    GFR: Estimated Creatinine Clearance: 65.1 mL/min (by C-G formula based on SCr of 0.7 mg/dL). Liver Function Tests: Recent Labs  Lab 06/18/20 1942 06/20/20 0405  AST 38  --   ALT 21  --   ALKPHOS 346*  --   BILITOT 0.9  --   PROT 6.5  --   ALBUMIN 2.0* 1.7*   No results for input(s): LIPASE, AMYLASE in the last 168 hours. No results for input(s): AMMONIA in the last 168 hours. Coagulation Profile: No results for input(s): INR, PROTIME in the last 168 hours. Cardiac Enzymes: No results for input(s): CKTOTAL, CKMB, CKMBINDEX, TROPONINI  in the last 168 hours. BNP (last 3 results) No results for input(s): PROBNP in the last 8760 hours. HbA1C: Recent Labs    06/22/20 0335  HGBA1C 6.5*   CBG: Recent Labs  Lab 06/22/20 1148 06/22/20 1719 06/22/20 2221 06/23/20 0739 06/23/20 1151  GLUCAP 182* 159* 141* 151* 177*   Lipid Profile: No results for input(s): CHOL, HDL, LDLCALC, TRIG, CHOLHDL, LDLDIRECT in the last 72 hours. Thyroid Function Tests: No results for input(s): TSH, T4TOTAL, FREET4, T3FREE, THYROIDAB in the last 72 hours. Anemia Panel: No results for input(s): VITAMINB12, FOLATE, FERRITIN, TIBC, IRON, RETICCTPCT in the last 72 hours. Sepsis Labs: No results for input(s): PROCALCITON, LATICACIDVEN in the last 168 hours.  Recent Results (from the past 240 hour(s))  Respiratory Panel by RT PCR (Flu A&B, Covid) - Nasopharyngeal Swab     Status: None   Collection Time: 06/18/20  7:43 PM   Specimen: Nasopharyngeal Swab  Result Value Ref Range Status   SARS Coronavirus 2 by RT PCR NEGATIVE NEGATIVE Final    Comment: (NOTE) SARS-CoV-2 target nucleic acids are NOT DETECTED.  The SARS-CoV-2 RNA is generally detectable in upper respiratoy specimens during the acute phase of infection. The lowest concentration of SARS-CoV-2 viral copies this assay can detect is 131 copies/mL. A negative result does not preclude SARS-Cov-2 infection and should not be used as the sole basis for treatment or other patient management decisions. A negative result may occur with  improper specimen collection/handling, submission of specimen other than nasopharyngeal swab, presence of viral mutation(s) within the areas targeted by this assay, and inadequate number of viral copies (<131 copies/mL). A negative result must be combined with clinical observations, patient history, and epidemiological information. The expected result is Negative.  Fact Sheet for Patients:  PinkCheek.be  Fact Sheet for  Healthcare Providers:  GravelBags.it  This test is no t yet approved or cleared by the [place] and  has been authorized for detection and/or diagnosis of SARS-CoV-2 by FDA under an Emergency Use Authorization (EUA). This EUA will remain  in effect (meaning this test can be used) for the duration of the COVID-19 declaration under Section 564(b)(1) of the Act, 21 U.S.C. section 360bbb-3(b)(1), unless the authorization is terminated or revoked sooner.     Influenza A by PCR NEGATIVE NEGATIVE Final   Influenza B by PCR NEGATIVE NEGATIVE Final    Comment: (NOTE) The Xpert Xpress SARS-CoV-2/FLU/RSV assay is intended as an aid in  the diagnosis of influenza from Nasopharyngeal swab specimens and  should not be used as a sole basis for treatment. Nasal washings and  aspirates are unacceptable for Xpert Xpress SARS-CoV-2/FLU/RSV  testing.  Fact Sheet for Patients: PinkCheek.be  Fact Sheet for Healthcare Providers: GravelBags.it  This test is not yet approved or cleared by the [place] FDA and  has been authorized for detection and/or diagnosis of SARS-CoV-2 by  FDA under an Emergency Use Authorization (EUA). This EUA will remain  in effect (meaning this test can be used) for the duration of the  Covid-19 declaration under Section 564(b)(1) of the Act, 21  U.S.C. section 360bbb-3(b)(1), unless the authorization is  terminated or revoked. Performed at [facility], [place] [address], [place], [place] [address]   SARS Coronavirus 2 by RT PCR (hospital order, performed in [facility] hospital lab) Nasopharyngeal Nasopharyngeal Swab     Status: None   Collection Time: 06/21/20  1:07 PM   Specimen: Nasopharyngeal Swab  Result Value Ref Range Status   SARS Coronavirus 2 NEGATIVE NEGATIVE Final    Comment: (NOTE) SARS-CoV-2 target nucleic acids are NOT DETECTED.  The  SARS-CoV-2 RNA is generally detectable in upper and lower respiratory specimens during the acute phase of infection. The lowest concentration of SARS-CoV-2 viral copies this assay can detect is 250 copies / mL. A negative result does not preclude SARS-CoV-2 infection and should not be used as the sole basis for treatment or other patient management decisions.  A negative result may occur with improper specimen collection / handling, submission of specimen other than nasopharyngeal swab, presence of viral mutation(s) within the areas targeted by this assay, and inadequate number of viral copies (<250 copies / mL). A negative result must be combined with clinical observations, patient history, and epidemiological information.  Fact Sheet for Patients:   StrictlyIdeas.no  Fact Sheet for Healthcare Providers: BankingDealers.co.za  This test is not yet approved or  cleared by the [place] FDA and has been authorized for detection and/or diagnosis of SARS-CoV-2 by FDA under an Emergency Use Authorization (EUA).  This EUA will remain in effect (meaning this test can be used) for the duration of the COVID-19 declaration under Section 564(b)(1) of the Act, 21 U.S.C. section 360bbb-3(b)(1), unless the authorization is terminated or revoked sooner.  Performed at [facility], [place] [address], [place], [place] [address]      Radiology Studies: No results found.   LOS: 4 days   Antonieta Pert, MD [facility]  06/23/2020, 1:16 PM

## 2020-06-23 NOTE — Progress Notes (Signed)
Occupational Therapy Treatment Patient Details Name: Adrian Singh MRN: [id number] DOB: 1934/06/29 Today's Date: 06/23/2020    History of present illness 86 year old male with past medical history of coronary artery disease, paroxysmal atrial fibrillation on Xarelto, noninsulin-dependent diabetes mellitus type 2, recent acute stroke 04/2020, hypertension, chronic kidney disease stage IIIa and metastatic prostate cancer to bone who presents to [facility] emergency department with complaints of severe lightheadedness and weakness.  Pt admitted for orthostatic hypotension    OT comments  Treatment focused on monitoring orthostatic hypotension improving activity tolerance in order to participate in functional mobility and ADLs. Patient supine in bed when therapist entered the room without complaints of dizziness. Patient able to bridge in bed to allow therapist to position abdominal binder. Patient mod assist to transfer to side of bed and minimal complaints of dizziness. Patient performed upper and lower extremity exercises at edge of bed to attempt to increase BP before standing.  Patient able to stand and take steps in front of recliner. Patient able to take steps and then side steps with min assist and RW to stand in front of sink but needed sitting rest break prior to grooming task. Patient able to stand at sink to wash face initially with min assist for safety - but then requiring increasing support due to fatigue and buckling knees. Demonstrating improving orthostatic hypotension and increased activity tolerance. Vital signs are as follows:  Supine 167/76 Sitting 107/67  Sitting after exercises: 113/63 Standing: 96/51 Prolonged standing: 106/86 Sitting after steps to sink: 109/64 Sitting after standing at sink: 99/62  Patient encouraged to sit up in chair and perform ankle pumps and arm movement every 30 minutes. Patient verbalized understanding. Continue to recommend short term  rehab at discharge.   Follow Up Recommendations  SNF    Equipment Recommendations  None recommended by OT    Recommendations for Other Services      Precautions / Restrictions Precautions Precautions: Fall Precaution Comments: orthostatic hypotension       Mobility Bed Mobility Overal bed mobility: Needs Assistance Bed Mobility: Sit to Supine     Supine to sit: Mod assist     General bed mobility comments: Assist for LEs and trunk lift off.  Transfers Overall transfer level: Needs assistance Equipment used: Rolling walker (2 wheeled) Transfers: Sit to/from Stand Sit to Stand: Min assist Stand pivot transfers: Min assist;From elevated surface       General transfer comment: Min assist to stand. Min assist to take steps to recliner. Increasing support required with standing at sink.    Balance   Sitting-balance support: No upper extremity supported;Feet supported Sitting balance-Leahy Scale: Fair     Standing balance support: Bilateral upper extremity supported Standing balance-Leahy Scale: Poor Standing balance comment: reliant on UEs and support for safety                            ADL either performed or assessed with clinical judgement   ADL       Grooming: Standing;Wash/dry face Grooming Details (indicate cue type and reason): Performed hand washing at sink. with initial min guard. During standing patient continued to fatigue and knees beginning to flex - with therapist providing more and more support to maintain standing position.                               General ADL Comments: Assistance  to donn abdominal binder. Patient able to dridge in bed to lift pelvis and lower back off of bed for therapist to position binder.     Vision Patient Visual Report: No change from baseline     Perception     Praxis      Cognition Arousal/Alertness: Awake/alert Behavior During Therapy: WFL for tasks assessed/performed Overall  Cognitive Status: Within Functional Limits for tasks assessed                                          Exercises Other Exercises Other Exercises: BUE exercises to attempt to improve BP prior to standing. Other Exercises: Education to perform arm raises and ankle pumps while in chair every 30 min   Shoulder Instructions       General Comments      Pertinent Vitals/ Pain       Pain Assessment: No/denies pain  Home Living                                          Prior Functioning/Environment              Frequency  Min 2X/week        Progress Toward Goals  OT Goals(current goals can now be found in the care plan section)  Progress towards OT goals: Progressing toward goals  Acute Rehab OT Goals Patient Stated Goal: Regain IND OT Goal Formulation: With patient Time For Goal Achievement: 07/04/20 Potential to Achieve Goals: [place] Discharge plan remains appropriate    Co-evaluation    PT/OT/SLP Co-Evaluation/Treatment: Yes Reason for Co-Treatment: For patient/therapist safety;To address functional/ADL transfers PT goals addressed during session: Mobility/safety with mobility OT goals addressed during session: ADL's and self-care      AM-PAC OT "6 Clicks" Daily Activity     Outcome Measure   Help from another person eating meals?: A Little Help from another person taking care of personal grooming?: A Little Help from another person toileting, which includes using toliet, bedpan, or urinal?: A Lot Help from another person bathing (including washing, rinsing, drying)?: A Lot Help from another person to put on and taking off regular upper body clothing?: A Little Help from another person to put on and taking off regular lower body clothing?: A Lot 6 Click Score: 15    End of Session Equipment Utilized During Treatment: Gait belt;Rolling walker  OT Visit Diagnosis: Unsteadiness on feet (R26.81);Other abnormalities of  gait and mobility (R26.89);Muscle weakness (generalized) (M62.81)   Activity Tolerance Patient limited by fatigue   Patient Left in chair;with chair alarm set;with call bell/phone within reach   Nurse Communication Mobility status        Time: 1245-8099 OT Time Calculation (min): 31 min  Charges: OT General Charges $OT Visit: 1 Visit OT Treatments $Self Care/Home Management : 8-22 mins  Derl Barrow, OTR/L [place]  Office [phone] Pager: [place] 06/23/2020, 12:45 PM

## 2020-06-24 DIAGNOSIS — Z7189 Other specified counseling: Secondary | ICD-10-CM | POA: Diagnosis not present

## 2020-06-24 DIAGNOSIS — R531 Weakness: Secondary | ICD-10-CM | POA: Diagnosis not present

## 2020-06-24 DIAGNOSIS — I5030 Unspecified diastolic (congestive) heart failure: Secondary | ICD-10-CM | POA: Diagnosis not present

## 2020-06-24 DIAGNOSIS — I5042 Chronic combined systolic (congestive) and diastolic (congestive) heart failure: Secondary | ICD-10-CM | POA: Diagnosis not present

## 2020-06-24 DIAGNOSIS — D649 Anemia, unspecified: Secondary | ICD-10-CM | POA: Diagnosis not present

## 2020-06-24 DIAGNOSIS — R54 Age-related physical debility: Secondary | ICD-10-CM | POA: Diagnosis present

## 2020-06-24 DIAGNOSIS — I480 Paroxysmal atrial fibrillation: Secondary | ICD-10-CM | POA: Diagnosis not present

## 2020-06-24 DIAGNOSIS — J690 Pneumonitis due to inhalation of food and vomit: Secondary | ICD-10-CM | POA: Diagnosis not present

## 2020-06-24 DIAGNOSIS — K567 Ileus, unspecified: Secondary | ICD-10-CM | POA: Diagnosis not present

## 2020-06-24 DIAGNOSIS — N183 Chronic kidney disease, stage 3 unspecified: Secondary | ICD-10-CM | POA: Diagnosis not present

## 2020-06-24 DIAGNOSIS — R52 Pain, unspecified: Secondary | ICD-10-CM | POA: Diagnosis not present

## 2020-06-24 DIAGNOSIS — R1084 Generalized abdominal pain: Secondary | ICD-10-CM | POA: Diagnosis not present

## 2020-06-24 DIAGNOSIS — R42 Dizziness and giddiness: Secondary | ICD-10-CM | POA: Diagnosis not present

## 2020-06-24 DIAGNOSIS — Z23 Encounter for immunization: Secondary | ICD-10-CM | POA: Diagnosis not present

## 2020-06-24 DIAGNOSIS — I69322 Dysarthria following cerebral infarction: Secondary | ICD-10-CM | POA: Diagnosis not present

## 2020-06-24 DIAGNOSIS — R1312 Dysphagia, oropharyngeal phase: Secondary | ICD-10-CM | POA: Diagnosis not present

## 2020-06-24 DIAGNOSIS — M6281 Muscle weakness (generalized): Secondary | ICD-10-CM | POA: Diagnosis not present

## 2020-06-24 DIAGNOSIS — E876 Hypokalemia: Secondary | ICD-10-CM | POA: Diagnosis not present

## 2020-06-24 DIAGNOSIS — E860 Dehydration: Secondary | ICD-10-CM | POA: Diagnosis not present

## 2020-06-24 DIAGNOSIS — R55 Syncope and collapse: Secondary | ICD-10-CM | POA: Diagnosis not present

## 2020-06-24 DIAGNOSIS — C61 Malignant neoplasm of prostate: Secondary | ICD-10-CM | POA: Diagnosis not present

## 2020-06-24 DIAGNOSIS — F411 Generalized anxiety disorder: Secondary | ICD-10-CM | POA: Diagnosis not present

## 2020-06-24 DIAGNOSIS — I5032 Chronic diastolic (congestive) heart failure: Secondary | ICD-10-CM | POA: Diagnosis not present

## 2020-06-24 DIAGNOSIS — R627 Adult failure to thrive: Secondary | ICD-10-CM | POA: Diagnosis not present

## 2020-06-24 DIAGNOSIS — E1122 Type 2 diabetes mellitus with diabetic chronic kidney disease: Secondary | ICD-10-CM | POA: Diagnosis not present

## 2020-06-24 DIAGNOSIS — E1165 Type 2 diabetes mellitus with hyperglycemia: Secondary | ICD-10-CM | POA: Diagnosis not present

## 2020-06-24 DIAGNOSIS — Z20822 Contact with and (suspected) exposure to covid-19: Secondary | ICD-10-CM | POA: Diagnosis not present

## 2020-06-24 DIAGNOSIS — R140 Abdominal distension (gaseous): Secondary | ICD-10-CM | POA: Diagnosis not present

## 2020-06-24 DIAGNOSIS — I4891 Unspecified atrial fibrillation: Secondary | ICD-10-CM | POA: Diagnosis not present

## 2020-06-24 DIAGNOSIS — K3184 Gastroparesis: Secondary | ICD-10-CM | POA: Diagnosis present

## 2020-06-24 DIAGNOSIS — I2510 Atherosclerotic heart disease of native coronary artery without angina pectoris: Secondary | ICD-10-CM | POA: Diagnosis not present

## 2020-06-24 DIAGNOSIS — I130 Hypertensive heart and chronic kidney disease with heart failure and stage 1 through stage 4 chronic kidney disease, or unspecified chronic kidney disease: Secondary | ICD-10-CM | POA: Diagnosis not present

## 2020-06-24 DIAGNOSIS — R0602 Shortness of breath: Secondary | ICD-10-CM | POA: Diagnosis not present

## 2020-06-24 DIAGNOSIS — E43 Unspecified severe protein-calorie malnutrition: Secondary | ICD-10-CM | POA: Diagnosis not present

## 2020-06-24 DIAGNOSIS — D630 Anemia in neoplastic disease: Secondary | ICD-10-CM | POA: Diagnosis present

## 2020-06-24 DIAGNOSIS — N1831 Chronic kidney disease, stage 3a: Secondary | ICD-10-CM | POA: Diagnosis not present

## 2020-06-24 DIAGNOSIS — Z515 Encounter for palliative care: Secondary | ICD-10-CM | POA: Diagnosis not present

## 2020-06-24 DIAGNOSIS — E11649 Type 2 diabetes mellitus with hypoglycemia without coma: Secondary | ICD-10-CM | POA: Diagnosis not present

## 2020-06-24 DIAGNOSIS — E1142 Type 2 diabetes mellitus with diabetic polyneuropathy: Secondary | ICD-10-CM | POA: Diagnosis not present

## 2020-06-24 DIAGNOSIS — Y92230 Patient room in hospital as the place of occurrence of the external cause: Secondary | ICD-10-CM | POA: Diagnosis not present

## 2020-06-24 DIAGNOSIS — D696 Thrombocytopenia, unspecified: Secondary | ICD-10-CM | POA: Diagnosis not present

## 2020-06-24 DIAGNOSIS — I340 Nonrheumatic mitral (valve) insufficiency: Secondary | ICD-10-CM | POA: Diagnosis not present

## 2020-06-24 DIAGNOSIS — C7951 Secondary malignant neoplasm of bone: Secondary | ICD-10-CM | POA: Diagnosis not present

## 2020-06-24 DIAGNOSIS — R0789 Other chest pain: Secondary | ICD-10-CM | POA: Diagnosis not present

## 2020-06-24 DIAGNOSIS — I499 Cardiac arrhythmia, unspecified: Secondary | ICD-10-CM | POA: Diagnosis not present

## 2020-06-24 DIAGNOSIS — Z8673 Personal history of transient ischemic attack (TIA), and cerebral infarction without residual deficits: Secondary | ICD-10-CM | POA: Diagnosis not present

## 2020-06-24 DIAGNOSIS — Z9861 Coronary angioplasty status: Secondary | ICD-10-CM | POA: Diagnosis not present

## 2020-06-24 DIAGNOSIS — E1143 Type 2 diabetes mellitus with diabetic autonomic (poly)neuropathy: Secondary | ICD-10-CM | POA: Diagnosis not present

## 2020-06-24 DIAGNOSIS — R935 Abnormal findings on diagnostic imaging of other abdominal regions, including retroperitoneum: Secondary | ICD-10-CM | POA: Diagnosis not present

## 2020-06-24 DIAGNOSIS — R778 Other specified abnormalities of plasma proteins: Secondary | ICD-10-CM | POA: Diagnosis not present

## 2020-06-24 DIAGNOSIS — K560 Paralytic ileus: Secondary | ICD-10-CM | POA: Diagnosis not present

## 2020-06-24 DIAGNOSIS — R109 Unspecified abdominal pain: Secondary | ICD-10-CM | POA: Diagnosis present

## 2020-06-24 DIAGNOSIS — I951 Orthostatic hypotension: Secondary | ICD-10-CM | POA: Diagnosis not present

## 2020-06-24 DIAGNOSIS — I10 Essential (primary) hypertension: Secondary | ICD-10-CM | POA: Diagnosis not present

## 2020-06-24 DIAGNOSIS — L89151 Pressure ulcer of sacral region, stage 1: Secondary | ICD-10-CM | POA: Diagnosis present

## 2020-06-24 DIAGNOSIS — I25119 Atherosclerotic heart disease of native coronary artery with unspecified angina pectoris: Secondary | ICD-10-CM | POA: Diagnosis not present

## 2020-06-24 DIAGNOSIS — I69351 Hemiplegia and hemiparesis following cerebral infarction affecting right dominant side: Secondary | ICD-10-CM | POA: Diagnosis not present

## 2020-06-24 LAB — RESPIRATORY PANEL BY RT PCR (FLU A&B, COVID)
Influenza A by PCR: NEGATIVE
Influenza B by PCR: NEGATIVE
SARS Coronavirus 2 by RT PCR: NEGATIVE

## 2020-06-24 LAB — GLUCOSE, CAPILLARY
Glucose-Capillary: 122 mg/dL — ABNORMAL HIGH (ref 70–99)
Glucose-Capillary: 227 mg/dL — ABNORMAL HIGH (ref 70–99)

## 2020-06-24 MED ORDER — SODIUM CHLORIDE 1 G PO TABS: 1.0000 g | ORAL_TABLET | Freq: Two times a day (BID) | ORAL | 0 refills | Status: AC

## 2020-06-24 MED ORDER — METOPROLOL TARTRATE 25 MG PO TABS: 12.5000 mg | ORAL_TABLET | Freq: Two times a day (BID) | ORAL | Status: DC

## 2020-06-24 NOTE — TOC Transition Note (Signed)
Transition of Care [facility]) - CM/SW Discharge Note   Patient Details  Name: Adrian Singh MRN: [id number] Date of Birth: Dec 02, 1933  [facility] CM/SW Contact:  Lennart Pall, LCSW Phone Number: 06/24/2020, 1:41 PM   Clinical Narrative:    Pt cleared for dc to SNF today.  Pt and family aware and agreeable.  PTAR called for pt to return to [facility].  No further TOC needs.   Final next level of care: Skilled Nursing Facility Barriers to Discharge: Barriers Resolved   Patient Goals and CMS Choice Patient states their goals for this hospitalization and ongoing recovery are:: to eventually return home CMS Medicare.gov Compare Post Acute Care list provided to:: Patient Choice offered to / list presented to : Adult Children  Discharge Placement              Patient [facility] bed at: [facility] Patient to be transferred to facility by: [facility] Name of family member notified: [name] - daughter Patient and family notified of of transfer: 06/24/20  Discharge Plan and Services In-house Referral: Clinical Social Work   Post Acute Care Choice: [place]          DME Arranged: N/A DME Agency: NA       HH Arranged: NA [place] Agency: NA        Social Determinants of Health (SDOH) Interventions     Readmission Risk Interventions Readmission Risk Prevention Plan 06/20/2020 06/11/2020  Transportation Screening Complete Complete  Medication Review [employer]) Complete Complete  PCP or Specialist appointment within 3-5 days of discharge Complete -  [place] or Home Care Consult Complete -  SW Recovery Care/Counseling Consult Complete Complete  Palliative Care Screening Not Applicable Not Applicable  Skilled Nursing Facility Complete Complete  Some recent data might be hidden

## 2020-06-24 NOTE — Progress Notes (Signed)
Physical Therapy Treatment Patient Details Name: Adrian Singh MRN: [id number] DOB: 02/27/1934 Today's Date: 06/24/2020    History of Present Illness 86 year old male with past medical history of coronary artery disease, paroxysmal atrial fibrillation on Xarelto, noninsulin-dependent diabetes mellitus type 2, recent acute stroke 04/2020, hypertension, chronic kidney disease stage IIIa and metastatic prostate cancer to bone who presents to [facility] emergency department with complaints of severe lightheadedness and weakness.  Pt admitted for orthostatic hypotension     PT Comments    Pt continues very cooperative but limited by generalized weakness and ongoing (but decreased severity and largely asymptomatic this date) postural hypotension.  This date pt assist to EOB sitting, balanced unassisted and then assisted to standing and to walk limited distance in room - distance limited by weakness and buckling at knees requiring pt return to chair for safety.  BP supine 134/68; sit 117/59; stand 98/46; stand x 1 minute 104/46 and after ambulating short distance 112/53.  Follow Up Recommendations  SNF     Equipment Recommendations  None recommended by PT    Recommendations for Other Services       Precautions / Restrictions Precautions Precautions: Fall Precaution Comments: orthostatic hypotension Restrictions Weight Bearing Restrictions: No    Mobility  Bed Mobility Overal bed mobility: Needs Assistance Bed Mobility: Supine to Sit     Supine to sit: Mod assist     General bed mobility comments: assist to complete roll, bring LEs over EOB and brign trunk to upright  Transfers Overall transfer level: Needs assistance Equipment used: Rolling walker (2 wheeled) Transfers: Sit to/from Stand Sit to Stand: Min assist;+2 physical assistance;+2 safety/equipment;From elevated surface Stand pivot transfers: Min assist;From elevated surface       General transfer comment:  cues for transition position and use of UEs to self asssist.  Physical assist to bring wt up and fwd and to balance in initial standing  Ambulation/Gait Ambulation/Gait assistance: Min assist;+2 safety/equipment Gait Distance (Feet): 6 Feet Assistive device: Rolling walker (2 wheeled) Gait Pattern/deviations: Step-to pattern Gait velocity: decr   General Gait Details: assist for balance and Rw positioning, mild dizziness after 6", requiring seated rest    Stairs             Wheelchair Mobility    Modified Rankin (Stroke Patients Only)       Balance Overall balance assessment: Needs assistance;History of Falls Sitting-balance support: No upper extremity supported;Feet supported Sitting balance-Leahy Scale: Fair     Standing balance support: Bilateral upper extremity supported Standing balance-Leahy Scale: Poor Standing balance comment: reliant on UEs and support for safety                             Cognition Arousal/Alertness: Awake/alert Behavior During Therapy: WFL for tasks assessed/performed Overall Cognitive Status: Within Functional Limits for tasks assessed                                 General Comments: very pleasant and cooperative      Exercises General Exercises - Lower Extremity Ankle Circles/Pumps: AROM;Both;Supine;15 reps Quad Sets: AROM;Both;10 reps    General Comments        Pertinent Vitals/Pain Pain Assessment: No/denies pain    Home Living                      Prior Function  PT Goals (current goals can now be found in the care plan section) Acute Rehab PT Goals Patient Stated Goal: Regain IND PT Goal Formulation: With patient Time For Goal Achievement: 07/03/20 Potential to Achieve Goals: Fair Progress towards PT goals: Progressing toward goals    Frequency    Min 3X/week      PT Plan Current plan remains appropriate    Co-evaluation              AM-PAC PT "6  Clicks" Mobility   Outcome Measure  Help needed turning from your back to your side while in a flat bed without using bedrails?: A Little Help needed moving from lying on your back to sitting on the side of a flat bed without using bedrails?: A Little Help needed moving to and from a bed to a chair (including a wheelchair)?: A Little Help needed standing up from a chair using your arms (e.g., wheelchair or bedside chair)?: A Little Help needed to walk in hospital room?: A Lot Help needed climbing 3-5 steps with a railing? : Total 6 Click Score: 15    End of Session Equipment Utilized During Treatment: Gait belt Activity Tolerance: Patient tolerated treatment well;Patient limited by fatigue Patient left: in chair;with call bell/phone within reach;with chair alarm set Nurse Communication: Mobility status PT Visit Diagnosis: History of falling (Z91.81);Muscle weakness (generalized) (M62.81);Unsteadiness on feet (R26.81);Difficulty in walking, not elsewhere classified (R26.2)     Time: 8016-5537 PT Time Calculation (min) (ACUTE ONLY): 24 min  Charges:  $Gait Training: 8-22 mins $Therapeutic Activity: 8-22 mins                     Debe Coder PT Acute Rehabilitation Services Pager [phone] Office [phone]    Jamacia Jester 06/24/2020, 4:12 PM

## 2020-06-24 NOTE — Discharge Summary (Signed)
Physician Discharge Summary  CHRISTPOHER SIEVERS IZT:[id number] DOB: 23-Jul-1934 DOA: 06/18/2020  PCP: [name], MD  Admit date: 06/18/2020 Discharge date: 06/24/2020  Admitted From: SNF Disposition:  SNF  Recommendations for Outpatient Follow-up:  1. Follow up with PCP in 1-2 weeks 2. Please obtain BMP/CBC in one week 3. Please follow up on the following pending results:  Home Health:No  Equipment/Devices: none  Discharge Condition: Stable Code Status:   Code Status: Full Code Diet recommendation:  Diet Order            Diet heart healthy/carb modified Room service appropriate? Yes; Fluid consistency: Thin  Diet effective now                  Brief/Interim Summary: 86 year old male with history of diastolic CHF, DM-2, metastatic prostate cancer, A. fib on Xarelto, CVA with residual dysarthria, nonobstructive CAD, debility, and recent hospitalization 9/23-9/27 for orthostatic hypotension returning with presyncope and orthostatic hypotension. At that time, it was felt to be due to diuretics, Cardura and poor p.o. intake but the medications were resumed on discharge to SNF. Per review of MAR from NH, Flomax was added, and patient has been getting Flomax and Cardura.  In ED, orthostatic vitals remained positive despite bolus IV fluids. The decision was made to admitfor observation. CMP without significant finding. Hgb 8.0 (about baseline Patient hospitalized for further management being treated with IV fluids, home Flomax and Cardura has been discontinued. At this time with abdominal binder TED hose and IV hydration orthostatic vitals has improved.  No more symptomatic understanding.  Is to have abdominal binder with activity.  If these measures fails can try midodrine and other medication moving forward Per family request palliative care will follow up at the skilled nursing facility.  Discharge Diagnoses:  Assessment & Plan:  Orthostatic hypotension/presyncope:  No more  symptomatic.Discontinued Cardura, Flomax.Cont compression stockings, abdominal binder-his abdomen binder with every activity can come off while on the bed.His oral hydration. Patient work with physical therapy no more dizziness on standing,walking and orthostatic vitals are stable as below. BP- Lying 134/68  BP- Sitting 117/59  BP- Standing at 0 minutes 98/46  BP- Standing at 3 minutes 104/46   Normocytic anemia: Hemoglobin is stable at 8.2 g.  Monitor intermittently.  Essential hypertension/history of CVA/PAF: In NSR, stroke w/ residual dysarthria and RLE weakness. Cont home Xarelto, metoprolol 12.5 mg twice daily.   History of metastatic prostate cancer on Xtandi:Cardura at home. cardura stopped, cont home meds  Diarrhea: Resolved.  Uncontrolled type 2 diabetes mellitus with diabetic polyneuropathy, without long-term current use of insulin ,hba1c stable 6.5.  Blood sugar is stable continue home regimen Last Labs          Recent Labs  Lab 06/22/20 1148 06/22/20 1719 06/22/20 2221 06/23/20 0739 06/23/20 1151  GLUCAP 182* 159* 141* 151* 177*     Severe protein calorie malnutrition:Augment diet.  CVA in 2021 august and abnormal brain imaging with 1.8 X1.6 to current centimeter ovoid/ T2/FLAIR hyperintense lesion in the right frontal white matter favored evolving subacute ischemic infarct, sequelae of demyelination or possibly tumor could be considered as a differential.  Had advised MRI brain in 2 to 3 months.  Daughter updated about the recommendation again.   Consults: Pt ot sw.  Subjective: Alert awake.  No more dizzy understanding this morning.  Eating well no nausea vomiting fever chills.  Feels ready for a skilled nursing facility after working with PT today.  Discharge Exam: Vitals:  06/24/20 0416 06/24/20 0419  BP: (!) 112/59 (!) 94/57  Pulse: 71 75  Resp: 18 16  Temp:    SpO2: 98% 98%   General: Pt is alert, awake, not in acute distress Cardiovascular:  RRR, S1/S2 +, no rubs, no gallops Respiratory: CTA bilaterally, no wheezing, no rhonchi Abdominal: Soft, NT, ND, bowel sounds + Extremities: no edema, no cyanosis  Discharge Instructions  Discharge Instructions    Discharge instructions   Complete by: As directed    Out of bed activity and physical therapy  only with Abdominal binder, compression stockings. Can remove abdominal binder while resting on the bed  Please call call MD or return to ER for similar or worsening recurring problem that brought you to hospital or if any fever,nausea/vomiting,abdominal pain, uncontrolled pain, chest pain,  shortness of breath or any other alarming symptoms.  Please follow-up your doctor as instructed in a week time and call the office for appointment.  Please avoid alcohol, smoking, or any other illicit substance and maintain healthy habits including taking your regular medications as prescribed.  You were cared for by a hospitalist during your hospital stay. If you have any questions about your discharge medications or the care you received while you were in the hospital after you are discharged, you can call the unit and ask to speak with the hospitalist on call if the hospitalist that took care of you is not available.  Once you are discharged, your primary care physician will handle any further medical issues. Please note that NO REFILLS for any discharge medications will be authorized once you are discharged, as it is imperative that you return to your primary care physician (or establish a relationship with a primary care physician if you do not have one) for your aftercare needs so that they can reassess your need for medications and monitor your lab values   Increase activity slowly   Complete by: As directed      Allergies as of 06/24/2020      Reactions   Soma [carisoprodol] Other (See Comments)   "did a number on me"   Doxazosin Other (See Comments)   dizziness      Medication List     STOP taking these medications   doxazosin 1 MG tablet Commonly known as: CARDURA   furosemide 40 MG tablet Commonly known as: LASIX   metoprolol succinate 50 MG 24 hr tablet Commonly known as: TOPROL-XL   ramipril 2.5 MG capsule Commonly known as: ALTACE     TAKE these medications   acetaminophen 650 MG CR tablet Commonly known as: TYLENOL Take 650 mg by mouth 2 (two) times daily as needed for pain.   atorvastatin 20 MG tablet Commonly known as: LIPITOR Take 20 mg by mouth at bedtime.   Bayer Microlet Lancets lancets Use to test blood sugar once daily dx: E11.40   Calcium 600 1500 (600 Ca) MG Tabs tablet Generic drug: calcium carbonate Take 600 mg of elemental calcium by mouth daily.   CONTOUR NEXT EZ MONITOR w/Device Kit USE TO TEST BLOOD SUGAR ONCE DAILY. Dx Code E11.40   Contour Next Test test strip Generic drug: glucose blood Check blood sugar once daily and as directed.Dx Code E11.40   cyanocobalamin 1000 MCG tablet Take 1,000 mg by mouth at bedtime.   docusate sodium 100 MG capsule Commonly known as: COLACE Take 200 mg by mouth 2 (two) times daily as needed for mild constipation.   enzalutamide 80 MG tablet Commonly known as:  XTANDI Take 80 mg by mouth daily.   feeding supplement (PRO-STAT SUGAR FREE 64) Liqd Take 30 mLs by mouth in the morning and at bedtime.   glimepiride 2 MG tablet Commonly known as: Amaryl Take 1 tablet (2 mg total) by mouth every morning. What changed: when to take this   metoprolol tartrate 25 MG tablet Commonly known as: LOPRESSOR Take 0.5 tablets (12.5 mg total) by mouth 2 (two) times daily.   multivitamin with minerals Tabs tablet Take 1 tablet by mouth daily.   nitroGLYCERIN 0.4 MG SL tablet Commonly known as: NITROSTAT Place 1 tablet (0.4 mg total) under the tongue every 5 (five) minutes as needed for chest pain. What changed: additional instructions   polyethylene glycol 17 g packet Commonly known as: MIRALAX /  GLYCOLAX Take 17 g by mouth daily.   rivaroxaban 20 MG Tabs tablet Commonly known as: XARELTO Take 1 tablet (20 mg total) by mouth daily with supper.   sodium chloride 1 g tablet Take 1 tablet (1 g total) by mouth 2 (two) times daily with a meal for 7 days.   traMADol 50 MG tablet Commonly known as: ULTRAM Take 1 tablet (50 mg total) by mouth 3 (three) times daily as needed. What changed: reasons to take this   VITAMIN B COMPLEX PO Take 1 tablet by mouth daily.   vitamin C 1000 MG tablet Take 1,000 mg by mouth daily.   Vitamin D3 1.25 MG (50000 UT) Caps Take 50,000 Units by mouth once a week. Saturday       Contact information for follow-up providers    Viviana Simpler I, MD Follow up in 1 week(s).   Specialties: Internal Medicine, Pediatrics Contact information: [place] [address] [phone]            Contact information for after-discharge care    Destination    [facility] Preferred SNF .   Service: Skilled Nursing Contact information: [address] [address] [phone]                 Allergies  Allergen Reactions  . Soma [Carisoprodol] Other (See Comments)    "did a number on me"  . Doxazosin Other (See Comments)    dizziness    The results of significant diagnostics from this hospitalization (including imaging, microbiology, ancillary and laboratory) are listed below for reference.    Microbiology: Recent Results (from the past 240 hour(s))  Respiratory Panel by RT PCR (Flu A&B, Covid) - Nasopharyngeal Swab     Status: None   Collection Time: 06/18/20  7:43 PM   Specimen: Nasopharyngeal Swab  Result Value Ref Range Status   SARS Coronavirus 2 by RT PCR NEGATIVE NEGATIVE Final    Comment: (NOTE) SARS-CoV-2 target nucleic acids are NOT DETECTED.  The SARS-CoV-2 RNA is generally detectable in upper respiratoy specimens during the acute phase of infection. The  lowest concentration of SARS-CoV-2 viral copies this assay can detect is 131 copies/mL. A negative result does not preclude SARS-Cov-2 infection and should not be used as the sole basis for treatment or other patient management decisions. A negative result may occur with  improper specimen collection/handling, submission of specimen other than nasopharyngeal swab, presence of viral mutation(s) within the areas targeted by this assay, and inadequate number of viral copies (<131 copies/mL). A negative result must be combined with clinical observations, patient history, and epidemiological information. The expected result is Negative.  Fact Sheet for Patients:  PinkCheek.be  Fact Sheet for Healthcare Providers:  GravelBags.it  This test is no t yet approved or cleared by the [place] FDA and  has been authorized for detection and/or diagnosis of SARS-CoV-2 by FDA under an Emergency Use Authorization (EUA). This EUA will remain  in effect (meaning this test can be used) for the duration of the COVID-19 declaration under Section 564(b)(1) of the Act, 21 U.S.C. section 360bbb-3(b)(1), unless the authorization is terminated or revoked sooner.     Influenza A by PCR NEGATIVE NEGATIVE Final   Influenza B by PCR NEGATIVE NEGATIVE Final    Comment: (NOTE) The Xpert Xpress SARS-CoV-2/FLU/RSV assay is intended as an aid in  the diagnosis of influenza from Nasopharyngeal swab specimens and  should not be used as a sole basis for treatment. Nasal washings and  aspirates are unacceptable for Xpert Xpress SARS-CoV-2/FLU/RSV  testing.  Fact Sheet for Patients: PinkCheek.be  Fact Sheet for Healthcare Providers: GravelBags.it  This test is not yet approved or cleared by the [place] FDA and  has been authorized for detection and/or diagnosis of SARS-CoV-2 by  FDA under  an Emergency Use Authorization (EUA). This EUA will remain  in effect (meaning this test can be used) for the duration of the  Covid-19 declaration under Section 564(b)(1) of the Act, 21  U.S.C. section 360bbb-3(b)(1), unless the authorization is  terminated or revoked. Performed at [facility], [place] [address] [place], [place] [address]   SARS Coronavirus 2 by RT PCR (hospital order, performed in [facility] hospital lab) Nasopharyngeal Nasopharyngeal Swab     Status: None   Collection Time: 06/21/20  1:07 PM   Specimen: Nasopharyngeal Swab  Result Value Ref Range Status   SARS Coronavirus 2 NEGATIVE NEGATIVE Final    Comment: (NOTE) SARS-CoV-2 target nucleic acids are NOT DETECTED.  The SARS-CoV-2 RNA is generally detectable in upper and lower respiratory specimens during the acute phase of infection. The lowest concentration of SARS-CoV-2 viral copies this assay can detect is 250 copies / mL. A negative result does not preclude SARS-CoV-2 infection and should not be used as the sole basis for treatment or other patient management decisions.  A negative result may occur with improper specimen collection / handling, submission of specimen other than nasopharyngeal swab, presence of viral mutation(s) within the areas targeted by this assay, and inadequate number of viral copies (<250 copies / mL). A negative result must be combined with clinical observations, patient history, and epidemiological information.  Fact Sheet for Patients:   StrictlyIdeas.no  Fact Sheet for Healthcare Providers: BankingDealers.co.za  This test is not yet approved or  cleared by the [place] FDA and has been authorized for detection and/or diagnosis of SARS-CoV-2 by FDA under an Emergency Use Authorization (EUA).  This EUA will remain in effect (meaning this test can be used) for the duration of the COVID-19 declaration under  Section 564(b)(1) of the Act, 21 U.S.C. section 360bbb-3(b)(1), unless the authorization is terminated or revoked sooner.  Performed at [facility], [place] [address], [place], [place] [address]   Respiratory Panel by RT PCR (Flu A&B, Covid) - Nasopharyngeal Swab     Status: None   Collection Time: 06/24/20 10:52 AM   Specimen: Nasopharyngeal Swab  Result Value Ref Range Status   SARS Coronavirus 2 by RT PCR NEGATIVE NEGATIVE Final    Comment: (NOTE) SARS-CoV-2 target nucleic acids are NOT DETECTED.  The SARS-CoV-2 RNA is generally detectable in upper respiratoy  specimens during the acute phase of infection. The lowest concentration of SARS-CoV-2 viral copies this assay can detect is 131 copies/mL. A negative result does not preclude SARS-Cov-2 infection and should not be used as the sole basis for treatment or other patient management decisions. A negative result may occur with  improper specimen collection/handling, submission of specimen other than nasopharyngeal swab, presence of viral mutation(s) within the areas targeted by this assay, and inadequate number of viral copies (<131 copies/mL). A negative result must be combined with clinical observations, patient history, and epidemiological information. The expected result is Negative.  Fact Sheet for Patients:  PinkCheek.be  Fact Sheet for Healthcare Providers:  GravelBags.it  This test is no t yet approved or cleared by the [place] FDA and  has been authorized for detection and/or diagnosis of SARS-CoV-2 by FDA under an Emergency Use Authorization (EUA). This EUA will remain  in effect (meaning this test can be used) for the duration of the COVID-19 declaration under Section 564(b)(1) of the Act, 21 U.S.C. section 360bbb-3(b)(1), unless the authorization is terminated or revoked sooner.     Influenza A by PCR NEGATIVE NEGATIVE Final    Influenza B by PCR NEGATIVE NEGATIVE Final    Comment: (NOTE) The Xpert Xpress SARS-CoV-2/FLU/RSV assay is intended as an aid in  the diagnosis of influenza from Nasopharyngeal swab specimens and  should not be used as a sole basis for treatment. Nasal washings and  aspirates are unacceptable for Xpert Xpress SARS-CoV-2/FLU/RSV  testing.  Fact Sheet for Patients: PinkCheek.be  Fact Sheet for Healthcare Providers: GravelBags.it  This test is not yet approved or cleared by the [place] FDA and  has been authorized for detection and/or diagnosis of SARS-CoV-2 by  FDA under an Emergency Use Authorization (EUA). This EUA will remain  in effect (meaning this test can be used) for the duration of the  Covid-19 declaration under Section 564(b)(1) of the Act, 21  U.S.C. section 360bbb-3(b)(1), unless the authorization is  terminated or revoked. Performed at [facility] [place] [address] [place], [place] [address]     Procedures/Studies: DG Chest 2 View  Result Date: 06/09/2020 CLINICAL DATA:  Suspected sepsis, discharge from hospital but blood pressure suddenly draft and patient became dizzy EXAM: CHEST - 2 VIEW COMPARISON:  Chest x-ray 05/11/2020, CT chest 05/01/2020 FINDINGS: The heart size and mediastinal contours are within normal limits. Coronary artery stent in grossly similar position. Lucency posterior to the left ventricle consistent with known hiatal hernia. No focal consolidation. No pulmonary edema. No pleural effusion. No pneumothorax. No acute osseous abnormality.  Degenerative changes of the spine. IMPRESSION: No active cardiopulmonary disease. Electronically Signed   By: Iven Finn M.D.   On: 06/09/2020 16:12   CT Head Wo Contrast  Result Date: 06/18/2020 CLINICAL DATA:  Weakness EXAM: CT HEAD WITHOUT CONTRAST TECHNIQUE: Contiguous axial images were obtained from the base of the skull through  the vertex without intravenous contrast. COMPARISON:  April 30, 2020 FINDINGS: Brain: No evidence of acute territorial infarction, hemorrhage, hydrocephalus,extra-axial collection or mass lesion/mass effect. There is dilatation the ventricles and sulci consistent with age-related atrophy. Low-attenuation changes in the deep white matter consistent with small vessel ischemia. Vascular: No hyperdense vessel or unexpected calcification. Skull: The skull is intact. No fracture or focal lesion identified. Sinuses/Orbits: The visualized paranasal sinuses and mastoid air cells are clear. The orbits and globes intact. Other: None IMPRESSION: No acute intracranial abnormality. Findings consistent with age related atrophy and chronic  small vessel ischemia Electronically Signed   By: Prudencio Pair M.D.   On: 06/18/2020 23:05   DG Chest Portable 1 View  Result Date: 06/18/2020 CLINICAL DATA:  Weakness. EXAM: PORTABLE CHEST 1 VIEW COMPARISON:  June 09, 2020 FINDINGS: Low lung volumes are seen which is likely secondary to the degree of patient inspiration. The heart size and mediastinal contours are within normal limits. Both lungs are clear. The visualized skeletal structures are unremarkable. IMPRESSION: No active cardiopulmonary disease. Electronically Signed   By: Virgina Norfolk M.D.   On: 06/18/2020 20:05    Labs: BNP (last 3 results) No results for input(s): BNP in the last 8760 hours. Basic Metabolic Panel: Recent Labs  Lab 06/18/20 1942 06/19/20 0500 06/19/20 0830 06/20/20 0405 06/23/20 0314  NA 140 140  --  140 137  K 3.6 3.3*  --  3.8 4.1  CL 106 107  --  109 103  CO2 22 21*  --  23 28  GLUCOSE 150* 108*  --  70 161*  BUN 13 13  --  13 18  CREATININE 0.76 0.66  --  0.60* 0.70  CALCIUM 7.5* 7.1*  --  7.5* 7.8*  MG 2.0  --  1.9 1.8  --   PHOS  --   --   --  2.1*  --    Liver Function Tests: Recent Labs  Lab 06/18/20 1942 06/20/20 0405  AST 38  --   ALT 21  --   ALKPHOS 346*  --    BILITOT 0.9  --   PROT 6.5  --   ALBUMIN 2.0* 1.7*   No results for input(s): LIPASE, AMYLASE in the last 168 hours. No results for input(s): AMMONIA in the last 168 hours. CBC: Recent Labs  Lab 06/18/20 1942 06/18/20 1942 06/19/20 0500 06/20/20 0405 06/21/20 0331 06/21/20 1159 06/23/20 0314  WBC 9.2  --  7.8 6.7  --   --  6.7  NEUTROABS 7.3  --   --   --   --   --   --   HGB 8.0*   < > 7.9* 8.2* 8.4* 8.3* 8.2*  HCT 26.0*   < > 26.6* 27.7* 28.6* 28.3* 28.4*  MCV 78.3*  --  79.9* 79.8*  --   --  82.6  PLT 206  --  194 204  --   --  187   < > = values in this interval not displayed.   Cardiac Enzymes: No results for input(s): CKTOTAL, CKMB, CKMBINDEX, TROPONINI in the last 168 hours. BNP: Invalid input(s): POCBNP CBG: Recent Labs  Lab 06/23/20 1151 06/23/20 1625 06/23/20 2145 06/24/20 0729 06/24/20 1144  GLUCAP 177* 109* 154* 122* 227*   D-Dimer No results for input(s): DDIMER in the last 72 hours. Hgb A1c Recent Labs    06/22/20 0335  HGBA1C 6.5*   Lipid Profile No results for input(s): CHOL, HDL, LDLCALC, TRIG, CHOLHDL, LDLDIRECT in the last 72 hours. Thyroid function studies No results for input(s): TSH, T4TOTAL, T3FREE, THYROIDAB in the last 72 hours.  Invalid input(s): FREET3 Anemia work up No results for input(s): VITAMINB12, FOLATE, FERRITIN, TIBC, IRON, RETICCTPCT in the last 72 hours. Urinalysis    Component Value Date/Time   COLORURINE YELLOW 06/18/2020 2200   APPEARANCEUR CLEAR 06/18/2020 2200   LABSPEC 1.009 06/18/2020 2200   PHURINE 7.0 06/18/2020 2200   GLUCOSEU NEGATIVE 06/18/2020 2200   HGBUR NEGATIVE 06/18/2020 2200   BILIRUBINUR NEGATIVE 06/18/2020 2200   BILIRUBINUR negative 03/18/2014  [place] (A) 06/18/2020 2200   PROTEINUR NEGATIVE 06/18/2020 2200   UROBILINOGEN 1.0 03/31/2014 1228   NITRITE NEGATIVE 06/18/2020 2200   LEUKOCYTESUR NEGATIVE 06/18/2020 2200   Sepsis Labs Invalid input(s): PROCALCITONIN,  WBC,   LACTICIDVEN Microbiology Recent Results (from the past 240 hour(s))  Respiratory Panel by RT PCR (Flu A&B, Covid) - Nasopharyngeal Swab     Status: None   Collection Time: 06/18/20  7:43 PM   Specimen: Nasopharyngeal Swab  Result Value Ref Range Status   SARS Coronavirus 2 by RT PCR NEGATIVE NEGATIVE Final    Comment: (NOTE) SARS-CoV-2 target nucleic acids are NOT DETECTED.  The SARS-CoV-2 RNA is generally detectable in upper respiratoy specimens during the acute phase of infection. The lowest concentration of SARS-CoV-2 viral copies this assay can detect is 131 copies/mL. A negative result does not preclude SARS-Cov-2 infection and should not be used as the sole basis for treatment or other patient management decisions. A negative result may occur with  improper specimen collection/handling, submission of specimen other than nasopharyngeal swab, presence of viral mutation(s) within the areas targeted by this assay, and inadequate number of viral copies (<131 copies/mL). A negative result must be combined with clinical observations, patient history, and epidemiological information. The expected result is Negative.  Fact Sheet for Patients:  PinkCheek.be  Fact Sheet for Healthcare Providers:  GravelBags.it  This test is no t yet approved or cleared by the [place] FDA and  has been authorized for detection and/or diagnosis of SARS-CoV-2 by FDA under an Emergency Use Authorization (EUA). This EUA will remain  in effect (meaning this test can be used) for the duration of the COVID-19 declaration under Section 564(b)(1) of the Act, 21 U.S.C. section 360bbb-3(b)(1), unless the authorization is terminated or revoked sooner.     Influenza A by PCR NEGATIVE NEGATIVE Final   Influenza B by PCR NEGATIVE NEGATIVE Final    Comment: (NOTE) The Xpert Xpress SARS-CoV-2/FLU/RSV assay is intended as an aid in  the diagnosis of  influenza from Nasopharyngeal swab specimens and  should not be used as a sole basis for treatment. Nasal washings and  aspirates are unacceptable for Xpert Xpress SARS-CoV-2/FLU/RSV  testing.  Fact Sheet for Patients: PinkCheek.be  Fact Sheet for Healthcare Providers: GravelBags.it  This test is not yet approved or cleared by the [place] FDA and  has been authorized for detection and/or diagnosis of SARS-CoV-2 by  FDA under an Emergency Use Authorization (EUA). This EUA will remain  in effect (meaning this test can be used) for the duration of the  Covid-19 declaration under Section 564(b)(1) of the Act, 21  U.S.C. section 360bbb-3(b)(1), unless the authorization is  terminated or revoked. Performed at [facility], [place] [address] [place], [place] [address]   SARS Coronavirus 2 by RT PCR (hospital order, performed in [facility] hospital lab) Nasopharyngeal Nasopharyngeal Swab     Status: None   Collection Time: 06/21/20  1:07 PM   Specimen: Nasopharyngeal Swab  Result Value Ref Range Status   SARS Coronavirus 2 NEGATIVE NEGATIVE Final    Comment: (NOTE) SARS-CoV-2 target nucleic acids are NOT DETECTED.  The SARS-CoV-2 RNA is generally detectable in upper and lower respiratory specimens during the acute phase of infection. The lowest concentration of SARS-CoV-2 viral copies this assay can detect is 250 copies / mL. A negative result does not preclude SARS-CoV-2 infection and should not be used as the sole basis for treatment or other  patient management decisions.  A negative result may occur with improper specimen collection / handling, submission of specimen other than nasopharyngeal swab, presence of viral mutation(s) within the areas targeted by this assay, and inadequate number of viral copies (<250 copies / mL). A negative result must be combined with clinical observations, patient history,  and epidemiological information.  Fact Sheet for Patients:   StrictlyIdeas.no  Fact Sheet for Healthcare Providers: BankingDealers.co.za  This test is not yet approved or  cleared by the [place] FDA and has been authorized for detection and/or diagnosis of SARS-CoV-2 by FDA under an Emergency Use Authorization (EUA).  This EUA will remain in effect (meaning this test can be used) for the duration of the COVID-19 declaration under Section 564(b)(1) of the Act, 21 U.S.C. section 360bbb-3(b)(1), unless the authorization is terminated or revoked sooner.  Performed at [facility], [place] [address] [place], [place] [address]   Respiratory Panel by RT PCR (Flu A&B, Covid) - Nasopharyngeal Swab     Status: None   Collection Time: 06/24/20 10:52 AM   Specimen: Nasopharyngeal Swab  Result Value Ref Range Status   SARS Coronavirus 2 by RT PCR NEGATIVE NEGATIVE Final    Comment: (NOTE) SARS-CoV-2 target nucleic acids are NOT DETECTED.  The SARS-CoV-2 RNA is generally detectable in upper respiratoy specimens during the acute phase of infection. The lowest concentration of SARS-CoV-2 viral copies this assay can detect is 131 copies/mL. A negative result does not preclude SARS-Cov-2 infection and should not be used as the sole basis for treatment or other patient management decisions. A negative result may occur with  improper specimen collection/handling, submission of specimen other than nasopharyngeal swab, presence of viral mutation(s) within the areas targeted by this assay, and inadequate number of viral copies (<131 copies/mL). A negative result must be combined with clinical observations, patient history, and epidemiological information. The expected result is Negative.  Fact Sheet for Patients:  PinkCheek.be  Fact Sheet for Healthcare Providers:   GravelBags.it  This test is no t yet approved or cleared by the [place] FDA and  has been authorized for detection and/or diagnosis of SARS-CoV-2 by FDA under an Emergency Use Authorization (EUA). This EUA will remain  in effect (meaning this test can be used) for the duration of the COVID-19 declaration under Section 564(b)(1) of the Act, 21 U.S.C. section 360bbb-3(b)(1), unless the authorization is terminated or revoked sooner.     Influenza A by PCR NEGATIVE NEGATIVE Final   Influenza B by PCR NEGATIVE NEGATIVE Final    Comment: (NOTE) The Xpert Xpress SARS-CoV-2/FLU/RSV assay is intended as an aid in  the diagnosis of influenza from Nasopharyngeal swab specimens and  should not be used as a sole basis for treatment. Nasal washings and  aspirates are unacceptable for Xpert Xpress SARS-CoV-2/FLU/RSV  testing.  Fact Sheet for Patients: PinkCheek.be  Fact Sheet for Healthcare Providers: GravelBags.it  This test is not yet approved or cleared by the [place] FDA and  has been authorized for detection and/or diagnosis of SARS-CoV-2 by  FDA under an Emergency Use Authorization (EUA). This EUA will remain  in effect (meaning this test can be used) for the duration of the  Covid-19 declaration under Section 564(b)(1) of the Act, 21  U.S.C. section 360bbb-3(b)(1), unless the authorization is  terminated or revoked. Performed at [facility], [place] [address], [place], [place] [address]      Time coordinating discharge: 25  minutes  SIGNED: Maren Beach  Lupita Leash, MD  [facility] 06/24/2020, 1:12 PM  If 7PM-7AM, please contact night-coverage [email or web address]

## 2020-06-27 ENCOUNTER — Telehealth: Payer: Self-pay | Admitting: Neurology

## 2020-06-27 NOTE — Telephone Encounter (Signed)
Opened in error

## 2020-06-30 DIAGNOSIS — R0789 Other chest pain: Secondary | ICD-10-CM | POA: Diagnosis not present

## 2020-06-30 DIAGNOSIS — F411 Generalized anxiety disorder: Secondary | ICD-10-CM | POA: Diagnosis not present

## 2020-06-30 DIAGNOSIS — R627 Adult failure to thrive: Secondary | ICD-10-CM | POA: Diagnosis not present

## 2020-06-30 DIAGNOSIS — M6281 Muscle weakness (generalized): Secondary | ICD-10-CM | POA: Diagnosis not present

## 2020-07-04 ENCOUNTER — Inpatient Hospital Stay (HOSPITAL_COMMUNITY)
Admission: EM | Admit: 2020-07-04 | Discharge: 2020-07-15 | DRG: 388 | Disposition: A | Discharge status: Skilled Nursing Facility | Payer: Medicare Other | Source: Skilled Nursing Facility | Attending: Internal Medicine | Admitting: Internal Medicine

## 2020-07-04 ENCOUNTER — Encounter (HOSPITAL_COMMUNITY): Payer: Self-pay

## 2020-07-04 ENCOUNTER — Emergency Department (HOSPITAL_COMMUNITY): Payer: Medicare Other

## 2020-07-04 ENCOUNTER — Telehealth: Payer: Self-pay | Admitting: Internal Medicine

## 2020-07-04 ENCOUNTER — Other Ambulatory Visit: Payer: Self-pay

## 2020-07-04 ENCOUNTER — Encounter: Payer: Medicare Other | Admitting: Internal Medicine

## 2020-07-04 ENCOUNTER — Ambulatory Visit: Payer: Medicare Other | Admitting: Cardiology

## 2020-07-04 DIAGNOSIS — L89109 Pressure ulcer of unspecified part of back, unspecified stage: Secondary | ICD-10-CM | POA: Diagnosis present

## 2020-07-04 DIAGNOSIS — Z8249 Family history of ischemic heart disease and other diseases of the circulatory system: Secondary | ICD-10-CM

## 2020-07-04 DIAGNOSIS — E876 Hypokalemia: Secondary | ICD-10-CM | POA: Diagnosis not present

## 2020-07-04 DIAGNOSIS — Z85828 Personal history of other malignant neoplasm of skin: Secondary | ICD-10-CM

## 2020-07-04 DIAGNOSIS — Z9861 Coronary angioplasty status: Secondary | ICD-10-CM

## 2020-07-04 DIAGNOSIS — R54 Age-related physical debility: Secondary | ICD-10-CM | POA: Diagnosis present

## 2020-07-04 DIAGNOSIS — D696 Thrombocytopenia, unspecified: Secondary | ICD-10-CM | POA: Diagnosis present

## 2020-07-04 DIAGNOSIS — Y92230 Patient room in hospital as the place of occurrence of the external cause: Secondary | ICD-10-CM | POA: Diagnosis not present

## 2020-07-04 DIAGNOSIS — I951 Orthostatic hypotension: Secondary | ICD-10-CM | POA: Diagnosis not present

## 2020-07-04 DIAGNOSIS — Z823 Family history of stroke: Secondary | ICD-10-CM

## 2020-07-04 DIAGNOSIS — E1165 Type 2 diabetes mellitus with hyperglycemia: Secondary | ICD-10-CM | POA: Diagnosis present

## 2020-07-04 DIAGNOSIS — C61 Malignant neoplasm of prostate: Secondary | ICD-10-CM | POA: Diagnosis not present

## 2020-07-04 DIAGNOSIS — I4891 Unspecified atrial fibrillation: Secondary | ICD-10-CM | POA: Diagnosis present

## 2020-07-04 DIAGNOSIS — L89151 Pressure ulcer of sacral region, stage 1: Secondary | ICD-10-CM | POA: Diagnosis present

## 2020-07-04 DIAGNOSIS — H748X3 Other specified disorders of middle ear and mastoid, bilateral: Secondary | ICD-10-CM | POA: Diagnosis not present

## 2020-07-04 DIAGNOSIS — I5042 Chronic combined systolic (congestive) and diastolic (congestive) heart failure: Secondary | ICD-10-CM | POA: Diagnosis not present

## 2020-07-04 DIAGNOSIS — E860 Dehydration: Secondary | ICD-10-CM | POA: Diagnosis not present

## 2020-07-04 DIAGNOSIS — R079 Chest pain, unspecified: Secondary | ICD-10-CM

## 2020-07-04 DIAGNOSIS — R627 Adult failure to thrive: Secondary | ICD-10-CM | POA: Diagnosis present

## 2020-07-04 DIAGNOSIS — I10 Essential (primary) hypertension: Secondary | ICD-10-CM | POA: Diagnosis present

## 2020-07-04 DIAGNOSIS — Z515 Encounter for palliative care: Secondary | ICD-10-CM

## 2020-07-04 DIAGNOSIS — R0602 Shortness of breath: Secondary | ICD-10-CM

## 2020-07-04 DIAGNOSIS — I480 Paroxysmal atrial fibrillation: Secondary | ICD-10-CM | POA: Diagnosis present

## 2020-07-04 DIAGNOSIS — K219 Gastro-esophageal reflux disease without esophagitis: Secondary | ICD-10-CM | POA: Diagnosis present

## 2020-07-04 DIAGNOSIS — K449 Diaphragmatic hernia without obstruction or gangrene: Secondary | ICD-10-CM | POA: Diagnosis not present

## 2020-07-04 DIAGNOSIS — I69351 Hemiplegia and hemiparesis following cerebral infarction affecting right dominant side: Secondary | ICD-10-CM | POA: Diagnosis not present

## 2020-07-04 DIAGNOSIS — I2510 Atherosclerotic heart disease of native coronary artery without angina pectoris: Secondary | ICD-10-CM | POA: Diagnosis present

## 2020-07-04 DIAGNOSIS — C7951 Secondary malignant neoplasm of bone: Secondary | ICD-10-CM | POA: Diagnosis present

## 2020-07-04 DIAGNOSIS — J690 Pneumonitis due to inhalation of food and vomit: Secondary | ICD-10-CM | POA: Diagnosis not present

## 2020-07-04 DIAGNOSIS — Z7984 Long term (current) use of oral hypoglycemic drugs: Secondary | ICD-10-CM

## 2020-07-04 DIAGNOSIS — R0989 Other specified symptoms and signs involving the circulatory and respiratory systems: Secondary | ICD-10-CM

## 2020-07-04 DIAGNOSIS — Z808 Family history of malignant neoplasm of other organs or systems: Secondary | ICD-10-CM

## 2020-07-04 DIAGNOSIS — E43 Unspecified severe protein-calorie malnutrition: Secondary | ICD-10-CM | POA: Diagnosis not present

## 2020-07-04 DIAGNOSIS — I130 Hypertensive heart and chronic kidney disease with heart failure and stage 1 through stage 4 chronic kidney disease, or unspecified chronic kidney disease: Secondary | ICD-10-CM | POA: Diagnosis not present

## 2020-07-04 DIAGNOSIS — K8689 Other specified diseases of pancreas: Secondary | ICD-10-CM | POA: Diagnosis not present

## 2020-07-04 DIAGNOSIS — R7989 Other specified abnormal findings of blood chemistry: Secondary | ICD-10-CM

## 2020-07-04 DIAGNOSIS — K567 Ileus, unspecified: Secondary | ICD-10-CM

## 2020-07-04 DIAGNOSIS — K3184 Gastroparesis: Secondary | ICD-10-CM | POA: Diagnosis present

## 2020-07-04 DIAGNOSIS — E11649 Type 2 diabetes mellitus with hypoglycemia without coma: Secondary | ICD-10-CM | POA: Diagnosis not present

## 2020-07-04 DIAGNOSIS — T45515A Adverse effect of anticoagulants, initial encounter: Secondary | ICD-10-CM | POA: Diagnosis not present

## 2020-07-04 DIAGNOSIS — Z955 Presence of coronary angioplasty implant and graft: Secondary | ICD-10-CM

## 2020-07-04 DIAGNOSIS — R778 Other specified abnormalities of plasma proteins: Secondary | ICD-10-CM

## 2020-07-04 DIAGNOSIS — R531 Weakness: Secondary | ICD-10-CM | POA: Diagnosis not present

## 2020-07-04 DIAGNOSIS — E1122 Type 2 diabetes mellitus with diabetic chronic kidney disease: Secondary | ICD-10-CM | POA: Diagnosis not present

## 2020-07-04 DIAGNOSIS — K861 Other chronic pancreatitis: Secondary | ICD-10-CM | POA: Diagnosis not present

## 2020-07-04 DIAGNOSIS — I69322 Dysarthria following cerebral infarction: Secondary | ICD-10-CM | POA: Diagnosis not present

## 2020-07-04 DIAGNOSIS — K560 Paralytic ileus: Principal | ICD-10-CM | POA: Diagnosis present

## 2020-07-04 DIAGNOSIS — L8990 Pressure ulcer of unspecified site, unspecified stage: Secondary | ICD-10-CM | POA: Diagnosis present

## 2020-07-04 DIAGNOSIS — R140 Abdominal distension (gaseous): Secondary | ICD-10-CM

## 2020-07-04 DIAGNOSIS — N183 Chronic kidney disease, stage 3 unspecified: Secondary | ICD-10-CM | POA: Diagnosis not present

## 2020-07-04 DIAGNOSIS — J90 Pleural effusion, not elsewhere classified: Secondary | ICD-10-CM | POA: Diagnosis not present

## 2020-07-04 DIAGNOSIS — I499 Cardiac arrhythmia, unspecified: Secondary | ICD-10-CM | POA: Diagnosis not present

## 2020-07-04 DIAGNOSIS — R1084 Generalized abdominal pain: Secondary | ICD-10-CM | POA: Diagnosis not present

## 2020-07-04 DIAGNOSIS — E1142 Type 2 diabetes mellitus with diabetic polyneuropathy: Secondary | ICD-10-CM | POA: Diagnosis present

## 2020-07-04 DIAGNOSIS — E785 Hyperlipidemia, unspecified: Secondary | ICD-10-CM | POA: Diagnosis present

## 2020-07-04 DIAGNOSIS — R319 Hematuria, unspecified: Secondary | ICD-10-CM | POA: Diagnosis not present

## 2020-07-04 DIAGNOSIS — Z8673 Personal history of transient ischemic attack (TIA), and cerebral infarction without residual deficits: Secondary | ICD-10-CM | POA: Diagnosis not present

## 2020-07-04 DIAGNOSIS — E1143 Type 2 diabetes mellitus with diabetic autonomic (poly)neuropathy: Secondary | ICD-10-CM | POA: Diagnosis not present

## 2020-07-04 DIAGNOSIS — Z801 Family history of malignant neoplasm of trachea, bronchus and lung: Secondary | ICD-10-CM

## 2020-07-04 DIAGNOSIS — Z888 Allergy status to other drugs, medicaments and biological substances status: Secondary | ICD-10-CM

## 2020-07-04 DIAGNOSIS — Z825 Family history of asthma and other chronic lower respiratory diseases: Secondary | ICD-10-CM

## 2020-07-04 DIAGNOSIS — N182 Chronic kidney disease, stage 2 (mild): Secondary | ICD-10-CM | POA: Diagnosis present

## 2020-07-04 DIAGNOSIS — R935 Abnormal findings on diagnostic imaging of other abdominal regions, including retroperitoneum: Secondary | ICD-10-CM | POA: Diagnosis not present

## 2020-07-04 DIAGNOSIS — D630 Anemia in neoplastic disease: Secondary | ICD-10-CM | POA: Diagnosis present

## 2020-07-04 DIAGNOSIS — K6389 Other specified diseases of intestine: Secondary | ICD-10-CM | POA: Diagnosis not present

## 2020-07-04 DIAGNOSIS — G9389 Other specified disorders of brain: Secondary | ICD-10-CM | POA: Diagnosis not present

## 2020-07-04 DIAGNOSIS — Z20822 Contact with and (suspected) exposure to covid-19: Secondary | ICD-10-CM | POA: Diagnosis not present

## 2020-07-04 DIAGNOSIS — G319 Degenerative disease of nervous system, unspecified: Secondary | ICD-10-CM | POA: Diagnosis not present

## 2020-07-04 DIAGNOSIS — Z7189 Other specified counseling: Secondary | ICD-10-CM | POA: Diagnosis not present

## 2020-07-04 DIAGNOSIS — Z7902 Long term (current) use of antithrombotics/antiplatelets: Secondary | ICD-10-CM

## 2020-07-04 DIAGNOSIS — R109 Unspecified abdominal pain: Secondary | ICD-10-CM | POA: Diagnosis not present

## 2020-07-04 DIAGNOSIS — Z8546 Personal history of malignant neoplasm of prostate: Secondary | ICD-10-CM

## 2020-07-04 DIAGNOSIS — I340 Nonrheumatic mitral (valve) insufficiency: Secondary | ICD-10-CM | POA: Diagnosis not present

## 2020-07-04 DIAGNOSIS — Z87891 Personal history of nicotine dependence: Secondary | ICD-10-CM

## 2020-07-04 DIAGNOSIS — Z79899 Other long term (current) drug therapy: Secondary | ICD-10-CM

## 2020-07-04 DIAGNOSIS — IMO0002 Reserved for concepts with insufficient information to code with codable children: Secondary | ICD-10-CM | POA: Diagnosis present

## 2020-07-04 DIAGNOSIS — R42 Dizziness and giddiness: Secondary | ICD-10-CM | POA: Diagnosis not present

## 2020-07-04 DIAGNOSIS — R52 Pain, unspecified: Secondary | ICD-10-CM | POA: Diagnosis not present

## 2020-07-04 DIAGNOSIS — I6389 Other cerebral infarction: Secondary | ICD-10-CM | POA: Diagnosis not present

## 2020-07-04 DIAGNOSIS — I5032 Chronic diastolic (congestive) heart failure: Secondary | ICD-10-CM | POA: Diagnosis not present

## 2020-07-04 DIAGNOSIS — G894 Chronic pain syndrome: Secondary | ICD-10-CM | POA: Diagnosis present

## 2020-07-04 DIAGNOSIS — R059 Cough, unspecified: Secondary | ICD-10-CM

## 2020-07-04 LAB — LIPASE, BLOOD: Lipase: 18 U/L (ref 11–51)

## 2020-07-04 LAB — COMPREHENSIVE METABOLIC PANEL
ALT: 30 U/L (ref 0–44)
AST: 58 U/L — ABNORMAL HIGH (ref 15–41)
Albumin: 2 g/dL — ABNORMAL LOW (ref 3.5–5.0)
Alkaline Phosphatase: 564 U/L — ABNORMAL HIGH (ref 38–126)
Anion gap: 13 (ref 5–15)
BUN: 34 mg/dL — ABNORMAL HIGH (ref 8–23)
CO2: 20 mmol/L — ABNORMAL LOW (ref 22–32)
Calcium: 7.7 mg/dL — ABNORMAL LOW (ref 8.9–10.3)
Chloride: 105 mmol/L (ref 98–111)
Creatinine, Ser: 1.01 mg/dL (ref 0.61–1.24)
GFR, Estimated: >60 mL/min (ref >60)
Glucose, Bld: 165 mg/dL — ABNORMAL HIGH (ref 70–99)
Potassium: 2.6 mmol/L — CL (ref 3.5–5.1)
Sodium: 138 mmol/L (ref 135–145)
Total Bilirubin: 0.8 mg/dL (ref 0.3–1.2)
Total Protein: 7.2 g/dL (ref 6.5–8.1)

## 2020-07-04 LAB — CBC
HCT: 30.9 % — ABNORMAL LOW (ref 39.0–52.0)
Hemoglobin: 9.2 g/dL — ABNORMAL LOW (ref 13.0–17.0)
MCH: 23.7 pg — ABNORMAL LOW (ref 26.0–34.0)
MCHC: 29.8 g/dL — ABNORMAL LOW (ref 30.0–36.0)
MCV: 79.6 fL — ABNORMAL LOW (ref 80.0–100.0)
Platelets: 219 10*3/uL (ref 150–400)
RBC: 3.88 MIL/uL — ABNORMAL LOW (ref 4.22–5.81)
RDW: 21.4 % — ABNORMAL HIGH (ref 11.5–15.5)
WBC: 6.6 10*3/uL (ref 4.0–10.5)
nRBC: 0.6 % — ABNORMAL HIGH (ref 0.0–0.2)

## 2020-07-04 LAB — TROPONIN I (HIGH SENSITIVITY): Troponin I (High Sensitivity): 87 ng/L — ABNORMAL HIGH (ref <18)

## 2020-07-04 MED ORDER — POTASSIUM CHLORIDE 10 MEQ/100ML IV SOLN
10.0000 meq | INTRAVENOUS | Status: AC
  Administered 2020-07-05 (×3): 10 meq via INTRAVENOUS
  Filled 2020-07-04 (×3): qty 100

## 2020-07-04 MED ORDER — SODIUM CHLORIDE 0.9 % IV BOLUS
500.0000 mL | Freq: Once | INTRAVENOUS | Status: AC
  Administered 2020-07-04: 500 mL via INTRAVENOUS

## 2020-07-04 NOTE — ED Notes (Signed)
Date and time results received: 07/04/20 11:28 PM  (use smartphrase ".now" to insert current time)  Test: Potassium Critical Value: 2.6  Name of Provider Notified: Dr.Glick  Orders Received? Or Actions Taken?:

## 2020-07-04 NOTE — ED Provider Notes (Signed)
[facility] DEPT Provider Note   CSN: [id number] Arrival date & time: 07/04/20  2220     History Chief Complaint  Patient presents with  . Abdominal Pain    Adrian Singh is a 86 y.o. male.  Patient with PMH of metastatic prostate cancer, afib on Xarelto, CAD, DM, prior stroke, and reported current paralytic ileus being managed with dietary restriction and IV fluids (per note in epic), presenting tonight with abdominal pain and SOB.  He denies any fevers, chills, or cough.  States last urinated at 1pm today.  Last BM 3 days ago.  Reports moderate abdominal pain.  Nothing makes symptoms better or worse.  Denies nausea or vomiting.  Denies any other associated symptoms.  From [facility].  The history is provided by the patient. No language interpreter was used.       Past Medical History:  Diagnosis Date  . Arthritis    "legs, back" (10/06/2015)  . Atrial fibrillation ([place])    x1  . Atrial fibrillation ([place])   . CAD (coronary artery disease)    nonobstructive  . Cancer ([place])    skin cancer on ear (froze it off) and back (cut it off)  . Dysrhythmia   . Esophageal reflux    hx of  . History of kidney stones   . Hx of heart artery stent   . Hypertrophy of prostate without urinary obstruction and other lower urinary tract symptoms (LUTS)   . Osteoarthrosis, unspecified whether generalized or localized, unspecified site   . Prostate cancer ([place])   . Skin cancer   . Stroke [facility]) 03/2014   "had a series of mini strokes; maybe 4"; denies residual on 10/06/2015  . Thrombocytopenia ([place])   . Type II diabetes mellitus ([place])    type 2  . Unspecified essential hypertension     Patient Active Problem List   Diagnosis Date Noted  . Postural dizziness with presyncope 06/09/2020  . Protein-calorie malnutrition, severe 05/06/2020  . Palliative care by specialist   . Goals of care, counseling/discussion   . DNR (do not resuscitate)  discussion   . FTT (failure to thrive) in adult   . Need for emotional support   . Pressure injury of skin 05/04/2020  . Stroke ([place]) 05/02/2020  . Generalized weakness 04/30/2020  . Fall at home, initial encounter 04/30/2020  . Acute on chronic anemia 04/30/2020  . Elevated CK 04/30/2020  . Chronic kidney disease, stage 3a ([place]) 04/30/2020  . Elevated troponin level not due myocardial infarction 04/30/2020  . Fatigue 04/29/2020  . Malnutrition of mild degree ([place]) 04/06/2019  . Diabetic foot ulcer ([place]) 04/06/2019  . Mood disorder ([place]) 04/06/2019  . Aortic atherosclerosis ([place]) 02/13/2017  . Prostate cancer ([place]) 02/13/2017  . Primary malignant neoplasm of prostate metastatic to bone ([place]) 02/13/2017  . Orthostatic hypotension 03/19/2016  . CAD S/P percutaneous coronary angioplasty 02/28/2016  . Atherosclerosis of native coronary artery of native heart with angina pectoris ([place])   . Abnormal nuclear stress test 09/22/2015  . Thrombocytopenia ([place])   . Advanced directives, counseling/discussion 09/27/2014  . PAF (paroxysmal atrial fibrillation) ([place]) 04/12/2014  . History of CVA (cerebrovascular accident) 03/31/2014  . CKD stage 3 due to type 2 diabetes mellitus ([place])   . Uncontrolled type 2 diabetes mellitus with diabetic polyneuropathy, without long-term current use of insulin ([place]) 11/05/2011  . Routine general medical examination at a health care facility 05/07/2011  . Essential hypertension 12/23/2009  . GERD  12/23/2009  . BPH (benign prostatic hyperplasia) 12/23/2009  . OSTEOARTHRITIS 12/23/2009    Past Surgical History:  Procedure Laterality Date  . BACK SURGERY     2001  . CARDIAC CATHETERIZATION  12/2007  . CARDIAC CATHETERIZATION N/A 09/22/2015   Procedure: Left Heart Cath and Coronary Angiography;  Surgeon: Peter M Martinique, MD;  Location: [facility];  Service: Cardiovascular;  Laterality: N/A;  . CARDIAC CATHETERIZATION N/A 10/06/2015   Procedure: Coronary  Stent Intervention;  Surgeon: Peter M Martinique, MD;  Location: [facility];  Service: Cardiovascular;  Laterality: N/A;  . CARDIAC CATHETERIZATION N/A 02/29/2016   Procedure: Left Heart Cath and Coronary Angiography;  Surgeon: Jettie Booze, MD;  Location: [facility];  Service: Cardiovascular;  Laterality: N/A;  . COLONOSCOPY W/ BIOPSIES AND POLYPECTOMY    . CORONARY STENT PLACEMENT  10/06/2015   [name]  with DES  . EAR CYST EXCISION N/A 04/06/2015   Procedure: EXCISION OF SCALP CYST;  Surgeon: Donnie Mesa, MD;  Location: [facility];  Service: General;  Laterality: N/A;  . ESOPHAGOGASTRODUODENOSCOPY (EGD) WITH ESOPHAGEAL DILATION  2001   with dilation  . LUMBAR DISC SURGERY  09/26/1999   "cleaned out arthritis and bone spurs"  . SHOULDER ARTHROSCOPY W/ ROTATOR CUFF REPAIR Left 2005  . THULIUM LASER TURP (TRANSURETHRAL RESECTION OF PROSTATE) N/A 01/21/2017   Procedure: Marcelino Duster LASER TURP (TRANSURETHRAL RESECTION OF PROSTATE) CAUTERIZATION OF BLADDER LESION;  Surgeon: Franchot Gallo, MD;  Location: [facility];  Service: Urology;  Laterality: N/A;       Family History  Problem Relation Age of Onset  . Stroke Father   . Peripheral vascular disease Father        amputation  . Heart failure Mother        CHF  . Coronary artery disease Mother   . Heart attack Mother 57       Multiple  . COPD Brother   . Heart disease Brother   . Cancer Brother        Bone  . Lung cancer Sister     Social History   Tobacco Use  . Smoking status: Former Smoker    Years: 3.00    Types: Cigarettes  . Smokeless tobacco: Never Used  . Tobacco comment: " Quit smoking by age 48; was a someday smoker "  Vaping Use  . Vaping Use: Never used  Substance Use Topics  . Alcohol use: No  . Drug use: No    Home Medications Prior to Admission medications   Medication Sig Start Date End Date Taking? Authorizing Provider  acetaminophen (TYLENOL) 650 MG CR tablet Take 650 mg by mouth 2 (two) times  daily as needed for pain.    [provider]  Amino Acids-Protein Hydrolys (FEEDING SUPPLEMENT, PRO-STAT SUGAR FREE 64,) LIQD Take 30 mLs by mouth in the morning and at bedtime.    [provider]  Ascorbic Acid (VITAMIN C) 1000 MG tablet Take 1,000 mg by mouth daily.    [provider]  atorvastatin (LIPITOR) 20 MG tablet Take 20 mg by mouth at bedtime.     [provider]  B Complex Vitamins (VITAMIN B COMPLEX PO) Take 1 tablet by mouth daily.    [provider]  BAYER MICROLET LANCETS lancets Use to test blood sugar once daily dx: E11.40 10/18/15   [name], MD  Blood Glucose Monitoring Suppl (CONTOUR NEXT EZ MONITOR) w/Device KIT USE TO TEST BLOOD SUGAR ONCE  DAILY. Dx Code E11.40 06/13/16   Viviana Simpler I, MD  calcium carbonate (CALCIUM 600) 1500 (600 Ca) MG TABS tablet Take 600 mg of elemental calcium by mouth daily.     [provider]  Cholecalciferol (VITAMIN D3) 1.25 MG (50000 UT) CAPS Take 50,000 Units by mouth once a week. Saturday    [provider]  cyanocobalamin 1000 MCG tablet Take 1,000 mg by mouth at bedtime.     [provider]  docusate sodium (COLACE) 100 MG capsule Take 200 mg by mouth 2 (two) times daily as needed for mild constipation.    [provider]  enzalutamide Gillermina Phy) 80 MG tablet Take 80 mg by mouth daily.    [provider]  glimepiride (AMARYL) 2 MG tablet Take 1 tablet (2 mg total) by mouth every morning. Patient taking differently: Take 2 mg by mouth daily.  06/13/20 06/13/21  Georgette Shell, MD  glucose blood (CONTOUR NEXT TEST) test strip Check blood sugar once daily and as directed.Dx Code E11.40 03/19/19   [name], MD  metoprolol tartrate (LOPRESSOR) 25 MG tablet Take 0.5 tablets (12.5 mg total) by mouth 2 (two) times daily. 06/24/20   Antonieta Pert, MD  Multiple Vitamin (MULTIVITAMIN WITH MINERALS) TABS tablet Take 1 tablet by mouth daily.      [provider]  nitroGLYCERIN (NITROSTAT) 0.4 MG SL tablet Place 1 tablet (0.4 mg total) under the tongue every 5 (five) minutes as needed for chest pain. Patient taking differently: Place 0.4 mg under the tongue every 5 (five) minutes as needed for chest pain. If no relief, call MD 05/15/18   [name], MD  polyethylene glycol (MIRALAX / GLYCOLAX) 17 g packet Take 17 g by mouth daily.    [provider]  rivaroxaban (XARELTO) 20 MG TABS tablet Take 1 tablet (20 mg total) by mouth daily with supper. 01/21/20   Martinique, Peter M, MD  traMADol (ULTRAM) 50 MG tablet Take 1 tablet (50 mg total) by mouth 3 (three) times daily as needed. Patient taking differently: Take 50 mg by mouth 3 (three) times daily as needed for moderate pain.  06/13/20   Georgette Shell, MD    Allergies    Soma [carisoprodol] and Doxazosin  Review of Systems   Review of Systems  All other systems reviewed and are negative.   Physical Exam Updated Vital Signs BP 102/65   Pulse (!) 125   Resp 19   Ht '5\' 10"'  (1.778 m)   Wt 65 kg   SpO2 97%   BMI 20.56 kg/m   Physical Exam Vitals and nursing note reviewed.  Constitutional:      Appearance: He is well-developed.  HENT:     Head: Normocephalic and atraumatic.  Eyes:     Conjunctiva/sclera: Conjunctivae normal.  Cardiovascular:     Rate and Rhythm: Regular rhythm. Tachycardia present.     Heart sounds: No murmur heard.   Pulmonary:     Effort: Pulmonary effort is normal. No respiratory distress.     Breath sounds: Normal breath sounds.  Abdominal:     Palpations: Abdomen is soft.     Tenderness: There is no abdominal tenderness.  Musculoskeletal:        General: Normal range of motion.     Cervical back: Neck supple.  Skin:    General: Skin is warm and dry.  Neurological:     Mental Status: He is alert and oriented to person, place, and time.  Psychiatric:        Mood and Affect: Mood normal.        Behavior: Behavior  normal.     ED Results / Procedures / Treatments   Labs (all labs ordered are listed, but only abnormal results are displayed) Labs Reviewed  RESPIRATORY PANEL BY RT PCR (FLU A&B, COVID)  LIPASE, BLOOD  COMPREHENSIVE METABOLIC PANEL  CBC  URINALYSIS, ROUTINE W REFLEX MICROSCOPIC  TROPONIN I (HIGH SENSITIVITY)    EKG None  Radiology No results found.  Procedures .Critical Care Performed by: Montine Circle, PA-C Authorized by: Montine Circle, PA-C   Critical care provider statement:    Critical care time (minutes):  37   Critical care was necessary to treat or prevent imminent or life-threatening deterioration of the following conditions:  Circulatory failure   Critical care was time spent personally by me on the following activities:  Discussions with consultants, evaluation of patient's response to treatment, examination of patient, ordering and performing treatments and interventions, ordering and review of laboratory studies, ordering and review of radiographic studies, pulse oximetry, re-evaluation of patient's condition, obtaining history from patient or surrogate and review of old charts Comments:     Elevated troponin   (including critical care time)  Medications Ordered in ED Medications  sodium chloride 0.9 % bolus 500 mL (has no administration in time range)    ED Course  I have reviewed the triage vital signs and the nursing notes.  Pertinent labs & imaging results that were available during my care of the patient were reviewed by me and considered in my medical decision making (see chart for details).    MDM Rules/Calculators/A&P                          This patient complains of abdominal pain and shortness of breath, this involves an extensive number of treatment options, and is a complaint that carries with it a high risk of complications and morbidity.    Differential Dx Covid, pneumonia, PE, ACS  Pertinent Labs I reviewed, and interpreted  labs, which included CBC without leukocytosis, hemoglobin 9.2, CMP notable for potassium of 2.6, creatinine 1.01, troponin 87.  Imaging Interpretation I ordered imaging studies which included chest x-ray.  I independently visualized and interpreted the chest x-ray, which showed no obvious abnormality.  Additionally, I ordered a CT abdomen/pelvis because of the patient's abdominal pain.  There are findings consistent with ileus.  Medications I ordered medication fluids and potassium for hypokalemia.  Reassessments After the interventions stated above, I reevaluated the patient and found still tachycardic, but appears stable for admission to the hospital.  Consultants Patient discussed with Dr. Hal Hope, who will admit the patient.  Appreciate his help.  Plan Admit to medicine for further management of ileus and to trend troponins which are elevated.  Patient seen by and discussed with Dr. Roxanne Mins.  Final Clinical Impression(s) / ED Diagnoses Final diagnoses:  Ileus ([place])  Hypokalemia  Elevated troponin    Rx / DC Orders ED Discharge Orders    None       Montine Circle, PA-C 50/09/38 1829    Delora Fuel, MD 93/71/69 [phone]

## 2020-07-04 NOTE — ED Triage Notes (Signed)
Pt arrives [facility] from [facility] with c/o generalized abdominal pain. No bm in approx 3 days. Hx afib.

## 2020-07-04 NOTE — Telephone Encounter (Signed)
[name] (daughter) called to cancel appointment Pt is at [facility]  Nurse notice last night he had swollen abd.  He has paralytic ileus.  They are treating with iv and not feeding him for two days.

## 2020-07-05 ENCOUNTER — Emergency Department (HOSPITAL_COMMUNITY): Payer: Medicare Other

## 2020-07-05 ENCOUNTER — Encounter (HOSPITAL_COMMUNITY): Payer: Self-pay

## 2020-07-05 DIAGNOSIS — E1165 Type 2 diabetes mellitus with hyperglycemia: Secondary | ICD-10-CM | POA: Diagnosis present

## 2020-07-05 DIAGNOSIS — I4891 Unspecified atrial fibrillation: Secondary | ICD-10-CM | POA: Diagnosis not present

## 2020-07-05 DIAGNOSIS — R531 Weakness: Secondary | ICD-10-CM | POA: Diagnosis not present

## 2020-07-05 DIAGNOSIS — I480 Paroxysmal atrial fibrillation: Secondary | ICD-10-CM | POA: Diagnosis present

## 2020-07-05 DIAGNOSIS — Z8673 Personal history of transient ischemic attack (TIA), and cerebral infarction without residual deficits: Secondary | ICD-10-CM | POA: Diagnosis not present

## 2020-07-05 DIAGNOSIS — I2510 Atherosclerotic heart disease of native coronary artery without angina pectoris: Secondary | ICD-10-CM | POA: Diagnosis not present

## 2020-07-05 DIAGNOSIS — Y92230 Patient room in hospital as the place of occurrence of the external cause: Secondary | ICD-10-CM | POA: Diagnosis not present

## 2020-07-05 DIAGNOSIS — R627 Adult failure to thrive: Secondary | ICD-10-CM | POA: Diagnosis present

## 2020-07-05 DIAGNOSIS — K567 Ileus, unspecified: Secondary | ICD-10-CM | POA: Diagnosis not present

## 2020-07-05 DIAGNOSIS — D630 Anemia in neoplastic disease: Secondary | ICD-10-CM | POA: Diagnosis present

## 2020-07-05 DIAGNOSIS — I130 Hypertensive heart and chronic kidney disease with heart failure and stage 1 through stage 4 chronic kidney disease, or unspecified chronic kidney disease: Secondary | ICD-10-CM | POA: Diagnosis not present

## 2020-07-05 DIAGNOSIS — R109 Unspecified abdominal pain: Secondary | ICD-10-CM | POA: Diagnosis present

## 2020-07-05 DIAGNOSIS — R935 Abnormal findings on diagnostic imaging of other abdominal regions, including retroperitoneum: Secondary | ICD-10-CM | POA: Diagnosis not present

## 2020-07-05 DIAGNOSIS — E11649 Type 2 diabetes mellitus with hypoglycemia without coma: Secondary | ICD-10-CM | POA: Diagnosis not present

## 2020-07-05 DIAGNOSIS — D696 Thrombocytopenia, unspecified: Secondary | ICD-10-CM | POA: Diagnosis not present

## 2020-07-05 DIAGNOSIS — K3184 Gastroparesis: Secondary | ICD-10-CM | POA: Diagnosis present

## 2020-07-05 DIAGNOSIS — E1142 Type 2 diabetes mellitus with diabetic polyneuropathy: Secondary | ICD-10-CM | POA: Diagnosis not present

## 2020-07-05 DIAGNOSIS — I69351 Hemiplegia and hemiparesis following cerebral infarction affecting right dominant side: Secondary | ICD-10-CM | POA: Diagnosis not present

## 2020-07-05 DIAGNOSIS — J690 Pneumonitis due to inhalation of food and vomit: Secondary | ICD-10-CM | POA: Diagnosis not present

## 2020-07-05 DIAGNOSIS — C7951 Secondary malignant neoplasm of bone: Secondary | ICD-10-CM | POA: Diagnosis not present

## 2020-07-05 DIAGNOSIS — Z515 Encounter for palliative care: Secondary | ICD-10-CM | POA: Diagnosis not present

## 2020-07-05 DIAGNOSIS — L89151 Pressure ulcer of sacral region, stage 1: Secondary | ICD-10-CM | POA: Diagnosis present

## 2020-07-05 DIAGNOSIS — R778 Other specified abnormalities of plasma proteins: Secondary | ICD-10-CM

## 2020-07-05 DIAGNOSIS — C61 Malignant neoplasm of prostate: Secondary | ICD-10-CM

## 2020-07-05 DIAGNOSIS — R54 Age-related physical debility: Secondary | ICD-10-CM | POA: Diagnosis present

## 2020-07-05 DIAGNOSIS — R140 Abdominal distension (gaseous): Secondary | ICD-10-CM | POA: Diagnosis not present

## 2020-07-05 DIAGNOSIS — E1122 Type 2 diabetes mellitus with diabetic chronic kidney disease: Secondary | ICD-10-CM | POA: Diagnosis present

## 2020-07-05 DIAGNOSIS — I69322 Dysarthria following cerebral infarction: Secondary | ICD-10-CM | POA: Diagnosis not present

## 2020-07-05 DIAGNOSIS — I10 Essential (primary) hypertension: Secondary | ICD-10-CM

## 2020-07-05 DIAGNOSIS — E43 Unspecified severe protein-calorie malnutrition: Secondary | ICD-10-CM | POA: Diagnosis not present

## 2020-07-05 DIAGNOSIS — I5042 Chronic combined systolic (congestive) and diastolic (congestive) heart failure: Secondary | ICD-10-CM | POA: Diagnosis not present

## 2020-07-05 DIAGNOSIS — I951 Orthostatic hypotension: Secondary | ICD-10-CM | POA: Diagnosis present

## 2020-07-05 DIAGNOSIS — E1143 Type 2 diabetes mellitus with diabetic autonomic (poly)neuropathy: Secondary | ICD-10-CM | POA: Diagnosis present

## 2020-07-05 DIAGNOSIS — K560 Paralytic ileus: Secondary | ICD-10-CM | POA: Diagnosis not present

## 2020-07-05 DIAGNOSIS — Z20822 Contact with and (suspected) exposure to covid-19: Secondary | ICD-10-CM | POA: Diagnosis not present

## 2020-07-05 DIAGNOSIS — E876 Hypokalemia: Secondary | ICD-10-CM | POA: Diagnosis present

## 2020-07-05 DIAGNOSIS — I340 Nonrheumatic mitral (valve) insufficiency: Secondary | ICD-10-CM | POA: Diagnosis not present

## 2020-07-05 LAB — RESPIRATORY PANEL BY RT PCR (FLU A&B, COVID)
Influenza A by PCR: NEGATIVE
Influenza B by PCR: NEGATIVE
SARS Coronavirus 2 by RT PCR: NEGATIVE

## 2020-07-05 LAB — BASIC METABOLIC PANEL
Anion gap: 15 (ref 5–15)
Anion gap: 14 (ref 5–15)
BUN: 35 mg/dL — ABNORMAL HIGH (ref 8–23)
BUN: 39 mg/dL — ABNORMAL HIGH (ref 8–23)
CO2: 18 mmol/L — ABNORMAL LOW (ref 22–32)
CO2: 17 mmol/L — ABNORMAL LOW (ref 22–32)
Calcium: 7.5 mg/dL — ABNORMAL LOW (ref 8.9–10.3)
Calcium: 7.7 mg/dL — ABNORMAL LOW (ref 8.9–10.3)
Chloride: 105 mmol/L (ref 98–111)
Chloride: 107 mmol/L (ref 98–111)
Creatinine, Ser: 0.91 mg/dL (ref 0.61–1.24)
Creatinine, Ser: 0.99 mg/dL (ref 0.61–1.24)
GFR, Estimated: >60 mL/min (ref >60)
GFR, Estimated: >60 mL/min (ref >60)
Glucose, Bld: 164 mg/dL — ABNORMAL HIGH (ref 70–99)
Glucose, Bld: 171 mg/dL — ABNORMAL HIGH (ref 70–99)
Potassium: 2.9 mmol/L — ABNORMAL LOW (ref 3.5–5.1)
Potassium: 3.1 mmol/L — ABNORMAL LOW (ref 3.5–5.1)
Sodium: 138 mmol/L (ref 135–145)
Sodium: 138 mmol/L (ref 135–145)

## 2020-07-05 LAB — URINALYSIS, ROUTINE W REFLEX MICROSCOPIC
Bilirubin Urine: NEGATIVE
Glucose, UA: NEGATIVE mg/dL
Hgb urine dipstick: NEGATIVE
Ketones, ur: NEGATIVE mg/dL
Leukocytes,Ua: NEGATIVE
Nitrite: NEGATIVE
Protein, ur: 30 mg/dL — AB
Specific Gravity, Urine: 1.018 (ref 1.005–1.030)
pH: 5 (ref 5.0–8.0)

## 2020-07-05 LAB — GLUCOSE, CAPILLARY
Glucose-Capillary: 132 mg/dL — ABNORMAL HIGH (ref 70–99)
Glucose-Capillary: 133 mg/dL — ABNORMAL HIGH (ref 70–99)
Glucose-Capillary: 153 mg/dL — ABNORMAL HIGH (ref 70–99)
Glucose-Capillary: 71 mg/dL (ref 70–99)
Glucose-Capillary: 91 mg/dL (ref 70–99)

## 2020-07-05 LAB — HEPATIC FUNCTION PANEL
ALT: 37 U/L (ref 0–44)
AST: 73 U/L — ABNORMAL HIGH (ref 15–41)
Albumin: 1.8 g/dL — ABNORMAL LOW (ref 3.5–5.0)
Alkaline Phosphatase: 491 U/L — ABNORMAL HIGH (ref 38–126)
Bilirubin, Direct: 0.2 mg/dL (ref 0.0–0.2)
Indirect Bilirubin: 0.6 mg/dL (ref 0.3–0.9)
Total Bilirubin: 0.8 mg/dL (ref 0.3–1.2)
Total Protein: 6.7 g/dL (ref 6.5–8.1)

## 2020-07-05 LAB — MAGNESIUM
Magnesium: 2.2 mg/dL (ref 1.7–2.4)
Magnesium: 2.2 mg/dL (ref 1.7–2.4)
Magnesium: 2.3 mg/dL (ref 1.7–2.4)

## 2020-07-05 LAB — PHOSPHORUS: Phosphorus: 2.6 mg/dL (ref 2.5–4.6)

## 2020-07-05 LAB — APTT: aPTT: 44 seconds — ABNORMAL HIGH (ref 24–36)

## 2020-07-05 LAB — TROPONIN I (HIGH SENSITIVITY)
Troponin I (High Sensitivity): 112 ng/L (ref <18)
Troponin I (High Sensitivity): 107 ng/L (ref <18)

## 2020-07-05 LAB — MRSA PCR SCREENING: MRSA by PCR: NEGATIVE

## 2020-07-05 LAB — CBG MONITORING, ED: Glucose-Capillary: 165 mg/dL — ABNORMAL HIGH (ref 70–99)

## 2020-07-05 LAB — TSH: TSH: 3.748 u[IU]/mL (ref 0.350–4.500)

## 2020-07-05 LAB — HEPARIN LEVEL (UNFRACTIONATED): Heparin Unfractionated: >2.2 IU/mL — ABNORMAL HIGH (ref 0.30–0.70)

## 2020-07-05 MED ORDER — SODIUM CHLORIDE 0.9 % IV SOLN: INTRAVENOUS | Status: DC

## 2020-07-05 MED ORDER — IOHEXOL 300 MG/ML  SOLN
100.0000 mL | Freq: Once | INTRAMUSCULAR | Status: AC | PRN
  Administered 2020-07-05: 100 mL via INTRAVENOUS

## 2020-07-05 MED ORDER — POTASSIUM CHLORIDE CRYS ER 20 MEQ PO TBCR
50.0000 meq | EXTENDED_RELEASE_TABLET | Freq: Once | ORAL | Status: AC
  Administered 2020-07-05: 50 meq via ORAL
  Filled 2020-07-05: qty 1

## 2020-07-05 MED ORDER — CHLORHEXIDINE GLUCONATE CLOTH 2 % EX PADS
6.0000 | MEDICATED_PAD | Freq: Every day | CUTANEOUS | Status: DC
  Administered 2020-07-05 – 2020-07-15 (×10): 6 via TOPICAL

## 2020-07-05 MED ORDER — DILTIAZEM HCL-DEXTROSE 125-5 MG/125ML-% IV SOLN (PREMIX)
5.0000 mg/h | INTRAVENOUS | Status: DC
  Administered 2020-07-05: 5 mg/h via INTRAVENOUS
  Administered 2020-07-05: 12.5 mg/h via INTRAVENOUS
  Administered 2020-07-06: 5 mg/h via INTRAVENOUS
  Administered 2020-07-06: 10 mg/h via INTRAVENOUS
  Administered 2020-07-07: 5 mg/h via INTRAVENOUS
  Filled 2020-07-05 (×5): qty 125

## 2020-07-05 MED ORDER — HEPARIN (PORCINE) 25000 UT/250ML-% IV SOLN
950.0000 [IU]/h | INTRAVENOUS | Status: DC
  Administered 2020-07-05: 950 [IU]/h via INTRAVENOUS
  Filled 2020-07-05: qty 250

## 2020-07-05 MED ORDER — INSULIN ASPART 100 UNIT/ML ~~LOC~~ SOLN
0.0000 [IU] | SUBCUTANEOUS | Status: DC
  Administered 2020-07-05 (×2): 2 [IU] via SUBCUTANEOUS
  Administered 2020-07-05 (×2): 1 [IU] via SUBCUTANEOUS
  Administered 2020-07-06 – 2020-07-07 (×3): 2 [IU] via SUBCUTANEOUS
  Administered 2020-07-07: 3 [IU] via SUBCUTANEOUS
  Administered 2020-07-07: 2 [IU] via SUBCUTANEOUS
  Administered 2020-07-07 (×2): 1 [IU] via SUBCUTANEOUS
  Administered 2020-07-08 (×4): 2 [IU] via SUBCUTANEOUS
  Administered 2020-07-08 – 2020-07-09 (×2): 1 [IU] via SUBCUTANEOUS
  Administered 2020-07-09 – 2020-07-10 (×5): 2 [IU] via SUBCUTANEOUS
  Administered 2020-07-10: 3 [IU] via SUBCUTANEOUS
  Administered 2020-07-10: 2 [IU] via SUBCUTANEOUS
  Administered 2020-07-10: 1 [IU] via SUBCUTANEOUS
  Administered 2020-07-11 (×2): 2 [IU] via SUBCUTANEOUS
  Administered 2020-07-11 (×2): 1 [IU] via SUBCUTANEOUS
  Administered 2020-07-11: 3 [IU] via SUBCUTANEOUS
  Administered 2020-07-12: 2 [IU] via SUBCUTANEOUS
  Administered 2020-07-12: 3 [IU] via SUBCUTANEOUS
  Administered 2020-07-12 – 2020-07-13 (×2): 2 [IU] via SUBCUTANEOUS
  Administered 2020-07-13 (×3): 1 [IU] via SUBCUTANEOUS
  Administered 2020-07-14 (×2): 2 [IU] via SUBCUTANEOUS
  Administered 2020-07-14: 1 [IU] via SUBCUTANEOUS
  Administered 2020-07-14 – 2020-07-15 (×3): 2 [IU] via SUBCUTANEOUS
  Filled 2020-07-05: qty 0.09

## 2020-07-05 MED ORDER — ENSURE ENLIVE PO LIQD
237.0000 mL | Freq: Two times a day (BID) | ORAL | Status: DC
  Administered 2020-07-08 – 2020-07-15 (×14): 237 mL via ORAL

## 2020-07-05 MED ORDER — MORPHINE SULFATE (PF) 2 MG/ML IV SOLN
0.5000 mg | INTRAVENOUS | Status: DC | PRN
  Administered 2020-07-05: 1 mg via INTRAVENOUS
  Administered 2020-07-05: 0.5 mg via INTRAVENOUS
  Administered 2020-07-06 (×3): 1 mg via INTRAVENOUS
  Filled 2020-07-05 (×5): qty 1

## 2020-07-05 MED ORDER — ACETAMINOPHEN 325 MG PO TABS
650.0000 mg | ORAL_TABLET | Freq: Four times a day (QID) | ORAL | Status: DC | PRN
  Administered 2020-07-05 – 2020-07-10 (×3): 650 mg via ORAL
  Filled 2020-07-05 (×3): qty 2

## 2020-07-05 MED ORDER — POTASSIUM CHLORIDE 10 MEQ/100ML IV SOLN
10.0000 meq | INTRAVENOUS | Status: AC
  Administered 2020-07-05 – 2020-07-06 (×6): 10 meq via INTRAVENOUS
  Filled 2020-07-05 (×6): qty 100

## 2020-07-05 MED ORDER — DILTIAZEM LOAD VIA INFUSION: 10.0000 mg | Freq: Once | INTRAVENOUS | Status: DC

## 2020-07-05 MED ORDER — POTASSIUM CHLORIDE IN NACL 20-0.9 MEQ/L-% IV SOLN
INTRAVENOUS | Status: AC
  Filled 2020-07-05 (×3): qty 1000

## 2020-07-05 MED ORDER — DILTIAZEM HCL 25 MG/5ML IV SOLN
10.0000 mg | Freq: Once | INTRAVENOUS | Status: AC
  Administered 2020-07-05: 10 mg via INTRAVENOUS
  Filled 2020-07-05: qty 5

## 2020-07-05 MED ORDER — ACETAMINOPHEN 650 MG RE SUPP: 650.0000 mg | Freq: Four times a day (QID) | RECTAL | Status: DC | PRN

## 2020-07-05 NOTE — Progress Notes (Signed)
Initial Nutrition Assessment  DOCUMENTATION CODES:   Severe malnutrition in context of chronic illness  INTERVENTION:  - diet advancement as medically feasible.   NUTRITION DIAGNOSIS:   Severe Malnutrition related to chronic illness (stroke) as evidenced by severe fat depletion, severe muscle depletion.  GOAL:   Patient will meet greater than or equal to 90% of their needs  MONITOR:   Diet advancement, Labs, Weight trends  REASON FOR ASSESSMENT:   Malnutrition Screening Tool  ASSESSMENT:   86 y.o. male with history of CAD s/p stenting, A. fib, type 2 DM, chronic anemia, stroke with dysarthria, and R-sided weakness. He was recently admitted 10 days ago for orthostatic hypotension at which time he had some medication changes. He presented to the ED from [facility] with 2 day history of abdominal pain and distention with last BM >24 hours prior.  Patient has been NPO since admission. Patient laying in bed with daughter at bedside. Patient was hospitalized at [facility] earlier this month. After hospitalization, he was discharged to [facility].   He does not like the food at the facility and mainly consumes scrambled eggs, fruits, and drinks 2-3 bottles of Ensure/day. He is able to feed himself.  Unable to get a clear answer on when the last time he ate was. He has been doing rehab at the facility and it sounds like he is mainly in bed or a wheelchair.   He reports having abdominal pain, distention, and taught feeling for the past 2-3 days. He denies having any N/V during this time frame.   Weight today is 148 lb and weight on 8/26 was 159 lb. This indicates 11 lb weight loss (7% body weight) in the past 2 months; significant for time frame.  Per notes: - abdominal distention with CT scan results concerning for ileus - afib with RVR - severe hypokalemia - metastatic prostate cancer   Labs reviewed; CBGs: 165 and 133 mg/dl, K: 3.1 mmol/l, BUN: 39 mg/dl, Ca: 7.7  mg/dl. Medications reviewed; sliding scale novolog, 50 mEq Klor-Con x1 dose 10/19, 10 mEq IV KCl x3 runs 10/18. IVF; NS-20 mEq IV KCl @ 75 ml/hr.     NUTRITION - FOCUSED PHYSICAL EXAM:    Most Recent Value  Orbital Region Severe depletion  Upper Arm Region Moderate depletion  Thoracic and Lumbar Region Moderate depletion  Buccal Region Severe depletion  Temple Region Severe depletion  Clavicle Bone Region Severe depletion  Clavicle and Acromion Bone Region Severe depletion  Scapular Bone Region Unable to assess  Dorsal Hand Moderate depletion  Patellar Region Severe depletion  Anterior Thigh Region Severe depletion  Posterior Calf Region Severe depletion  Edema (RD Assessment) None  Hair Reviewed  Eyes Reviewed  Mouth Reviewed  Skin Reviewed  Nails Reviewed       Diet Order:   Diet Order            Diet NPO time specified  Diet effective now                 EDUCATION NEEDS:   No education needs have been identified at this time  Skin:  Skin Assessment: Reviewed RN Assessment  Last BM:  10/16 (per patient report)  Height:   Ht Readings from Last 1 Encounters:  07/05/20 5\' 11"  (1.803 m)    Weight:   Wt Readings from Last 1 Encounters:  07/05/20 67 kg     Estimated Nutritional Needs:  Kcal:  2015-2235 kcal Protein:  100-115 grams Fluid:  >/=  2.2 L/day     [name], MS, RD, LDN, CNSC Inpatient Clinical Dietitian RD pager # available in [facility]  After hours/weekend pager # available in [facility]

## 2020-07-05 NOTE — ED Notes (Signed)
Date and time results received: 07/05/20 0215 (use smartphrase ".now" to insert current time)  Test: troponin Critical Value: 112  Name of Provider Notified: Hal Hope, MD   Orders Received? Or Actions Taken?: Actions Taken: MD notified

## 2020-07-05 NOTE — Progress Notes (Signed)
Patient has had 2 pauses this shift. He was asymptomatic on reassessment. Also converted from Afib to NSR. Notified B. Kyere NP without any new orders. Will continue to monitor.

## 2020-07-05 NOTE — Progress Notes (Signed)
PROGRESS NOTE    Adrian Singh  ZSW:[id number] DOB: 22-May-1934 DOA: 07/04/2020 PCP: [name], MD     Brief Narrative:  Adrian Singh is a 86 y.o. WM PMHx CAD status post stenting, A. 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  Denies any vomiting fever or chills.  Abdominal discomfort is generalized.  Was able to take his medications.  ED Course: In the ER patient was in A. fib with RVR.  CT abdomen pelvis shows features concerning for possible ileus.  Labs are significant for high sensitive troponin of 8712.  Hemoglobin is 9.2 potassium was low at 2.6 Covid test was negative.  Patient was started on potassium replacement and also since patient is in A. fib with RVR Cardizem bolus was initially given then started on Cardizem infusion.  Admitted for ileus and A. fib with RVR.  Denies any chest pain.  But did complain of some shortness of breath.  Chest x-ray did not show any infiltrates.    Subjective: A/O x4, negative S OB, positive abdominal discomfort from distention.  States negative pain.  Negative nausea, vomiting, diarrhea.  States did have BM x2 yesterday.   Assessment & Plan: Covid vaccination;   Principal Problem:   Ileus ([place]) Active Problems:   Essential hypertension   Uncontrolled type 2 diabetes mellitus with diabetic polyneuropathy, without long-term current use of insulin ([place])   CKD stage 3 due to type 2 diabetes mellitus ([place])    History of CVA (cerebrovascular accident)   CAD S/P percutaneous coronary angioplasty   Prostate cancer (HCC)   Elevated troponin   Pressure injury of skin   Hypokalemia   Atrial fibrillation with RVR (HCC)   Hypokalemia -Potassium goal> 4 -Repeat K/Mg/PO4@1000  -Repeat K below Potassium IV 60 mEq  Ileus -CT scan consistent with ileus. -Correct all electrolyte abnormalities  Chronic pain syndrome -Morphine PRN  Chronic systolic CHF -Strict in and out -Daily weight  Fib with RVR -Cardizem drip -Currently NSR -Heparin drip per pharmacy  Chronic anemia -Anemia panel pending Lab Results  Component Value Date   HGB 9.2 (L) 07/04/2020   HGB 8.2 (L) 06/23/2020   HGB 8.3 (L) 06/21/2020   HGB 8.4 (L) 06/21/2020   HGB 8.2 (L) 06/20/2020  -Stable  DM type II controlled with complication; DM gastroparesis -10/6 hemoglobin A1c= 6.5 -Sensitive SSI  Metastatic prostate cancer   Hx CVA with residual RIGHT sided weakness and dysarthria    DVT prophylaxis: Heparin infusion Code Status: Full Family Communication: 10/19 daughter at bedside for discussion of plan of care answered all questions Status is: Inpatient    Dispo: The patient is from: Home              Anticipated d/c is to: Home              Anticipated d/c date is: 10/26              Patient currently unstable      Consultants:    Procedures/Significant Events:  05/01/2020 echocardiogram with bubble study;LVEF= 50 to  55%.  -The left ventricle demonstrates regional wall motion abnormalities. Severe hypokinesis of the left ventricular, entire inferolateral wall. Moderate hypokinesis of the left ventricular, basal-mid inferior wall. -Left ventricular diastolic parameters are indeterminate.      10/19 CT abdomen pelvis W contrast;Moderately distended gas and fluid-filled colon and rectum likely to represent ileus. Infectious process would also be possible. No evidence of small bowel obstruction. 2.  Moderate esophageal hiatal hernia. 3. Minimal bilateral pleural effusions. 4. Diffuse bone metastasis. 5. Stable appearance of low-attenuation lesion in the pancreatic head. 6. Aortic atherosclerosis. 7. Changes of chronic pancreatitis.  I have personally reviewed and interpreted all radiology studies and my findings are as above.  VENTILATOR SETTINGS:    Cultures   Antimicrobials:    Devices    LINES / TUBES:      Continuous Infusions: . 0.9 % NaCl with KCl 20 mEq / L 75 mL/hr at 07/05/20 0739  . diltiazem (CARDIZEM) infusion 10 mg/hr (07/05/20 0739)  . heparin       Objective: Vitals:   07/05/20 0430 07/05/20 0515 07/05/20 0600 07/05/20 0728  BP: 110/79 (!) 115/54 126/60 106/70  Pulse: (!) 140 (!) 105 (!) 142 74  Resp: (!) 21 10 13 13   Temp:   97.6 F (36.4 C)   TempSrc:   Axillary   SpO2: 98% 98% 100% 99%  Weight:   67 kg   Height:   5\' 11"  (1.803 m)     Intake/Output Summary (Last 24 hours) at 07/05/2020 0740 Last data filed at 07/05/2020 1610 Gross per 24 hour  Intake 585.6 ml  Output 250 ml  Net 335.6 ml   Filed Weights   07/04/20 2240 07/05/20 0600  Weight: 65 kg 67 kg    Examination:  General: A/O x4, No acute respiratory distress Eyes: negative scleral hemorrhage, negative anisocoria, negative icterus ENT: Negative Runny nose, negative gingival bleeding, Neck:  Negative scars, masses, torticollis, lymphadenopathy, JVD Lungs: Clear to auscultation bilaterally without wheezes or crackles Cardiovascular: Regular rate and rhythm without murmur gallop or rub normal S1 and S2 Abdomen: negative abdominal pain, positive distention, positive soft, bowel sounds, no rebound, no ascites, no appreciable mass Extremities: No significant cyanosis, clubbing, or edema bilateral lower extremities Skin: Negative rashes, lesions, ulcers Psychiatric:  Negative depression, negative anxiety, negative fatigue, negative mania  Central nervous system:  Cranial  nerves II through XII intact, tongue/uvula midline, all extremities muscle strength 5/5, sensation intact throughout, negative dysarthria, negative expressive aphasia, negative receptive aphasia.  .     Data Reviewed: Care during the described time interval was provided by me .  I have reviewed this patient's available data, including medical history, events of note, physical examination, and all test results as part of my evaluation.  CBC: Recent Labs  Lab 07/04/20 2237  WBC 6.6  HGB 9.2*  HCT 30.9*  MCV 79.6*  PLT 960   Basic Metabolic Panel: Recent Labs  Lab 07/04/20 2237 07/04/20 2312 07/05/20 0402  NA 138  --  138  K 2.6*  --  2.9*  CL 105  --  105  CO2 20*  --  18*  GLUCOSE 165*  --  164*  BUN 34*  --  35*  CREATININE 1.01  --  0.91  CALCIUM 7.7*  --  7.5*  MG  --  2.2 2.2   GFR: Estimated Creatinine Clearance: 58.3 mL/min (by C-G formula based on SCr of 0.91 mg/dL). Liver Function Tests: Recent Labs  Lab 07/04/20 2237 07/05/20  0402  AST 58* 73*  ALT 30 37  ALKPHOS 564* 491*  BILITOT 0.8 0.8  PROT 7.2 6.7  ALBUMIN 2.0* 1.8*   Recent Labs  Lab 07/04/20 2237  LIPASE 18   No results for input(s): AMMONIA in the last 168 hours. Coagulation Profile: No results for input(s): INR, PROTIME in the last 168 hours. Cardiac Enzymes: No results for input(s): CKTOTAL, CKMB, CKMBINDEX, TROPONINI in the last 168 hours. BNP (last 3 results) No results for input(s): PROBNP in the last 8760 hours. HbA1C: No results for input(s): HGBA1C in the last 72 hours. CBG: Recent Labs  Lab 07/05/20 0409  GLUCAP 165*   Lipid Profile: No results for input(s): CHOL, HDL, LDLCALC, TRIG, CHOLHDL, LDLDIRECT in the last 72 hours. Thyroid Function Tests: Recent Labs    07/05/20 0402  TSH 3.748   Anemia Panel: No results for input(s): VITAMINB12, FOLATE, FERRITIN, TIBC, IRON, RETICCTPCT in the last 72 hours. Sepsis Labs: No results for input(s): PROCALCITON, LATICACIDVEN  in the last 168 hours.  Recent Results (from the past 240 hour(s))  Respiratory Panel by RT PCR (Flu A&B, Covid) - Nasopharyngeal Swab     Status: None   Collection Time: 07/04/20 10:56 PM   Specimen: Nasopharyngeal Swab  Result Value Ref Range Status   SARS Coronavirus 2 by RT PCR NEGATIVE NEGATIVE Final    Comment: (NOTE) SARS-CoV-2 target nucleic acids are NOT DETECTED.  The SARS-CoV-2 RNA is generally detectable in upper respiratoy specimens during the acute phase of infection. The lowest concentration of SARS-CoV-2 viral copies this assay can detect is 131 copies/mL. A negative result does not preclude SARS-Cov-2 infection and should not be used as the sole basis for treatment or other patient management decisions. A negative result may occur with  improper specimen collection/handling, submission of specimen other than nasopharyngeal swab, presence of viral mutation(s) within the areas targeted by this assay, and inadequate number of viral copies (<131 copies/mL). A negative result must be combined with clinical observations, patient history, and epidemiological information. The expected result is Negative.  Fact Sheet for Patients:  PinkCheek.be  Fact Sheet for Healthcare Providers:  GravelBags.it  This test is no t yet approved or cleared by the [place] FDA and  has been authorized for detection and/or diagnosis of SARS-CoV-2 by FDA under an Emergency Use Authorization (EUA). This EUA will remain  in effect (meaning this test can be used) for the duration of the COVID-19 declaration under Section 564(b)(1) of the Act, 21 U.S.C. section 360bbb-3(b)(1), unless the authorization is terminated or revoked sooner.     Influenza A by PCR NEGATIVE NEGATIVE Final   Influenza B by PCR NEGATIVE NEGATIVE Final    Comment: (NOTE) The Xpert Xpress SARS-CoV-2/FLU/RSV assay is intended as an aid in  the diagnosis of  influenza from Nasopharyngeal swab specimens and  should not be used as a sole basis for treatment. Nasal washings and  aspirates are unacceptable for Xpert Xpress SARS-CoV-2/FLU/RSV  testing.  Fact Sheet for Patients: PinkCheek.be  Fact Sheet for Healthcare Providers: GravelBags.it  This test is not yet approved or cleared by the [place] FDA and  has been authorized for detection and/or diagnosis of SARS-CoV-2 by  FDA under an Emergency Use Authorization (EUA). This EUA will remain  in effect (meaning this test can be used) for the duration of the  Covid-19 declaration under Section 564(b)(1) of the Act, 21  U.S.C. section 360bbb-3(b)(1), unless the authorization is  terminated or revoked. Performed at  [facility], [place] [address] [place], [place] [address]          Radiology Studies: CT ABDOMEN PELVIS W CONTRAST  Result Date: 07/05/2020 CLINICAL DATA:  Suspected bowel obstruction. No bowel movements in 3 days. EXAM: CT ABDOMEN AND PELVIS WITH CONTRAST TECHNIQUE: Multidetector CT imaging of the abdomen and pelvis was performed using the standard protocol following bolus administration of intravenous contrast. CONTRAST:  148mL OMNIPAQUE IOHEXOL 300 MG/ML  SOLN COMPARISON:  05/01/2020 FINDINGS: Lower chest: Mild interstitial pattern to the lung bases likely representing fibrosis. Minimal bilateral pleural effusions. Moderate esophageal hiatal hernia. Coronary artery calcifications. Hepatobiliary: No focal liver abnormality is seen. No gallstones, gallbladder wall thickening, or biliary dilatation. Pancreas: Pancreas is atrophic. Low-attenuation lesion in the pancreatic head appears stable. Calcification in the pancreatic head likely indicates chronic pancreatitis. Spleen: Normal in size without focal abnormality. Adrenals/Urinary Tract: Adrenal glands are unremarkable. Kidneys are normal, without renal  calculi, focal lesion, or hydronephrosis. Bladder is unremarkable. Stomach/Bowel: Stomach and small bowel are decompressed. Moderately distended gas and fluid-filled colon and rectum. Changes likely to represent ileus. Infectious process would also be possible. No colonic wall thickening to suggest colitis. Appendix is not identified. Vascular/Lymphatic: Aortic atherosclerosis. No enlarged abdominal or pelvic lymph nodes. Reproductive: Prostate is unremarkable. Other: No free air or free fluid in the abdomen. Abdominal wall musculature appears intact. Musculoskeletal: Prominent degenerative changes throughout the spine. Multiple focal and confluent areas of sclerosis in the visualized skeleton consistent with diffuse bone metastasis. IMPRESSION: 1. Moderately distended gas and fluid-filled colon and rectum likely to represent ileus. Infectious process would also be possible. No evidence of small bowel obstruction. 2. Moderate esophageal hiatal hernia. 3. Minimal bilateral pleural effusions. 4. Diffuse bone metastasis. 5. Stable appearance of low-attenuation lesion in the pancreatic head. 6. Aortic atherosclerosis. 7. Changes of chronic pancreatitis. Aortic Atherosclerosis (ICD10-I70.0). Electronically Signed   By: Lucienne Capers M.D.   On: 07/05/2020 01:22   DG Chest Port 1 View  Result Date: 07/04/2020 CLINICAL DATA:  Shortness of breath EXAM: PORTABLE CHEST 1 VIEW COMPARISON:  06/18/2020 FINDINGS: The heart size and mediastinal contours are within normal limits. Both lungs are clear. The visualized skeletal structures are unremarkable. IMPRESSION: No active disease. Electronically Signed   By: Inez Catalina M.D.   On: 07/04/2020 23:15        Scheduled Meds: . Chlorhexidine Gluconate Cloth  6 each Topical Daily  . insulin aspart  0-9 Units Subcutaneous Q4H   Continuous Infusions: . 0.9 % NaCl with KCl 20 mEq / L 75 mL/hr at 07/05/20 0739  . diltiazem (CARDIZEM) infusion 10 mg/hr (07/05/20 0739)    . heparin       LOS: 0 days    Time spent:40 min    Lylian Sanagustin, Geraldo Docker, MD [facility] Pager [phone]  If 7PM-7AM, please contact night-coverage [email or web address] Password [email or web address] 07/05/2020, 7:40 AM

## 2020-07-05 NOTE — Progress Notes (Signed)
ANTICOAGULATION CONSULT NOTE - Initial Consult  Pharmacy Consult for heparin Indication: atrial fibrillation  Allergies  Allergen Reactions  . Soma [Carisoprodol] Other (See Comments)    "did a number on me"  . Doxazosin Other (See Comments)    dizziness    Patient Measurements: Height: 5\' 10"  (177.8 cm) Weight: 65 kg (143 lb 4.8 oz) IBW/kg (Calculated) : 73 Heparin Dosing Weight: 65kg  Vital Signs: Temp: 97.6 F (36.4 C) (10/18 2300) Temp Source: Oral (10/18 2300) BP: 126/76 (10/19 0315) Pulse Rate: 118 (10/19 0315)  Labs: Recent Labs    07/04/20 2237 07/04/20 2256 07/05/20 0056  HGB 9.2*  --   --   HCT 30.9*  --   --   PLT 219  --   --   CREATININE 1.01  --   --   TROPONINIHS  --  87* 112*    Estimated Creatinine Clearance: 50.9 mL/min (by C-G formula based on SCr of 1.01 mg/dL).   Medical History: Past Medical History:  Diagnosis Date  . Arthritis    "legs, back" (10/06/2015)  . Atrial fibrillation ([place])    x1  . Atrial fibrillation ([place])   . CAD (coronary artery disease)    nonobstructive  . Cancer ([place])    skin cancer on ear (froze it off) and back (cut it off)  . Dysrhythmia   . Esophageal reflux    hx of  . History of kidney stones   . Hx of heart artery stent   . Hypertrophy of prostate without urinary obstruction and other lower urinary tract symptoms (LUTS)   . Osteoarthrosis, unspecified whether generalized or localized, unspecified site   . Prostate cancer ([place])   . Skin cancer   . Stroke [facility]) 03/2014   "had a series of mini strokes; maybe 4"; denies residual on 10/06/2015  . Thrombocytopenia ([place])   . Type II diabetes mellitus ([place])    type 2  . Unspecified essential hypertension      Assessment: 86 year old male with history of metastatic prostate cancer, atrial fibrillation anticoagulated on rivaroxaban, diabetes comes in with abdominal pain and distention and no bowel movement for the last 3 days.  Pharmacy consulted to dose  heparin for AFib.  LD xarelto 10/18 @ 1800  Baseline aPTT and HL ordered STAT Hgb 9.2 Ptls WNL  Goal of Therapy:  Heparin level 0.3-0.7 units/ml  APTT 66-102 seconds Monitor platelets by anticoagulation protocol   Plan:   at 1800 start  Heparin drip at 950 units/hr APTT in 8 hours after starting heparin drip Daily CBC   Dolly Rias RPh 07/05/2020, 3:47 AM

## 2020-07-05 NOTE — Progress Notes (Signed)
Current Palliative:  Manufacturing engineer ([facility]) Community Based Palliative Care       This patient is enrolled in our palliative care services in the community.  [facility] will continue to follow for any discharge planning needs and to coordinate continuation of palliative care.   If you have questions or need assistance, please call [phone] or contact the hospital Liaison listed on AMION.     Thank you for the opportunity to participate in this patient's care.     Domenic Moras, BSN, RN [facility] Liaison   [phone]

## 2020-07-05 NOTE — H&P (Signed)
History and Physical    Adrian Singh IFB:[id number] DOB: May 22, 1934 DOA: 07/04/2020  PCP: [name], MD  Patient coming from: Skilled nursing facility.  Chief Complaint: Abdominal pain.  HPI: Adrian Singh is a 86 y.o. male with history of CAD status post stenting, A. fib, diabetes mellitus type 2, chronic anemia, history of stroke with dysarthria and right-sided weakness recently admitted 10 days ago for orthostatic hypotension at that time patient had some medication changes presents to the ER with complaints of 2 days of increasing abdominal discomfort with distention last bowel movement was more than 24 hours ago.  Denies any vomiting fever or chills.  Abdominal discomfort is generalized.  Was able to take his medications.  ED Course: In the ER patient was in A. fib with RVR.  CT abdomen pelvis shows features concerning for possible ileus.  Labs are significant for high sensitive troponin of 8712.  Hemoglobin is 9.2 potassium was low at 2.6 Covid test was negative.  Patient was started on potassium replacement and also since patient is in A. fib with RVR Cardizem bolus was initially given then started on Cardizem infusion.  Admitted for ileus and A. fib with RVR.  Denies any chest pain.  But did complain of some shortness of breath.  Chest x-ray did not show any infiltrates.  Review of Systems: As per HPI, rest all negative.   Past Medical History:  Diagnosis Date  . Arthritis    "legs, back" (10/06/2015)  . Atrial fibrillation ([place])    x1  . Atrial fibrillation ([place])   . CAD (coronary artery disease)    nonobstructive  . Cancer ([place])    skin cancer on ear (froze it off) and back (cut it off)  . Dysrhythmia   . Esophageal reflux    hx of  . History of kidney stones   . Hx of heart artery stent   . Hypertrophy of prostate without urinary obstruction and other lower urinary tract symptoms (LUTS)   . Osteoarthrosis, unspecified whether generalized or localized,  unspecified site   . Prostate cancer ([place])   . Skin cancer   . Stroke [facility]) 03/2014   "had a series of mini strokes; maybe 4"; denies residual on 10/06/2015  . Thrombocytopenia ([place])   . Type II diabetes mellitus ([place])    type 2  . Unspecified essential hypertension     Past Surgical History:  Procedure Laterality Date  . BACK SURGERY     2001  . CARDIAC CATHETERIZATION  12/2007  . CARDIAC CATHETERIZATION N/A 09/22/2015   Procedure: Left Heart Cath and Coronary Angiography;  Surgeon: Peter M Martinique, MD;  Location: [facility];  Service: Cardiovascular;  Laterality: N/A;  . CARDIAC CATHETERIZATION N/A 10/06/2015   Procedure: Coronary Stent Intervention;  Surgeon: Peter M Martinique, MD;  Location: [facility];  Service: Cardiovascular;  Laterality: N/A;  . CARDIAC CATHETERIZATION N/A 02/29/2016   Procedure: Left Heart Cath and Coronary Angiography;  Surgeon: Jettie Booze, MD;  Location: [facility];  Service: Cardiovascular;  Laterality: N/A;  . COLONOSCOPY W/ BIOPSIES AND POLYPECTOMY    . CORONARY STENT PLACEMENT  10/06/2015   [name]  with DES  . EAR CYST EXCISION N/A 04/06/2015   Procedure: EXCISION OF SCALP CYST;  Surgeon: Donnie Mesa, MD;  Location: [facility];  Service: General;  Laterality: N/A;  . ESOPHAGOGASTRODUODENOSCOPY (EGD) WITH ESOPHAGEAL DILATION  2001   with dilation  . LUMBAR DISC SURGERY  09/26/1999   "cleaned out arthritis and bone spurs"  . SHOULDER ARTHROSCOPY W/ ROTATOR CUFF REPAIR Left 2005  . THULIUM LASER TURP (TRANSURETHRAL RESECTION OF PROSTATE) N/A 01/21/2017   Procedure: Marcelino Duster LASER TURP (TRANSURETHRAL RESECTION OF PROSTATE) CAUTERIZATION OF BLADDER LESION;  Surgeon: Franchot Gallo, MD;  Location: [facility];  Service: Urology;  Laterality: N/A;     reports that he has quit smoking. His smoking use included cigarettes. He quit after 3.00 years of use. He has never used smokeless tobacco. He reports that he does not drink alcohol and does not use  drugs.  Allergies  Allergen Reactions  . Soma [Carisoprodol] Other (See Comments)    "did a number on me"  . Doxazosin Other (See Comments)    dizziness    Family History  Problem Relation Age of Onset  . Stroke Father   . Peripheral vascular disease Father        amputation  . Heart failure Mother        CHF  . Coronary artery disease Mother   . Heart attack Mother 70       Multiple  . COPD Brother   . Heart disease Brother   . Cancer Brother        Bone  . Lung cancer Sister     Prior to Admission medications   Medication Sig Start Date End Date Taking? Authorizing Provider  acetaminophen (TYLENOL) 325 MG tablet Take 650 mg by mouth every 12 (twelve) hours as needed for mild pain or moderate pain.   Yes [provider]  Amino Acids-Protein Hydrolys (FEEDING SUPPLEMENT, PRO-STAT SUGAR FREE 64,) LIQD Take 30 mLs by mouth in the morning and at bedtime.   Yes [provider]  Ascorbic Acid (VITAMIN C) 1000 MG tablet Take 1,000 mg by mouth daily.   Yes [provider]  atorvastatin (LIPITOR) 20 MG tablet Take 20 mg by mouth at bedtime.    Yes [provider]  B Complex Vitamins (VITAMIN B COMPLEX PO) Take 1 tablet by mouth daily.   Yes [provider]  calcium carbonate (CALCIUM 600) 1500 (600 Ca) MG TABS tablet Take 600 mg of elemental calcium by mouth daily.    Yes [provider]  Cholecalciferol (VITAMIN D3) 1.25 MG (50000 UT) CAPS Take 50,000 Units by mouth once a week. Saturday   Yes [provider]  cyanocobalamin 1000 MCG tablet Take 1,000 mg by mouth at bedtime.    Yes [provider]  docusate sodium (COLACE) 100 MG capsule Take 200 mg by mouth 2 (two) times daily as needed for mild constipation.   Yes [provider]  enzalutamide (XTANDI) 80 MG tablet Take 80 mg by mouth daily.   Yes [provider]  furosemide (LASIX) 40 MG tablet Take 40 mg by mouth daily as needed.   Yes  [provider]  glimepiride (AMARYL) 2 MG tablet Take 1 tablet (2 mg total) by mouth every morning. Patient taking differently: Take 2 mg by mouth daily.  06/13/20 06/13/21 Yes Georgette Shell, MD  metoprolol tartrate (LOPRESSOR) 25 MG tablet Take 0.5 tablets (12.5 mg total) by mouth 2 (two) times daily. 06/24/20  Yes Antonieta Pert, MD  Multiple Vitamin (MULTIVITAMIN WITH MINERALS) TABS tablet Take 1 tablet by mouth daily.    Yes [provider]  nitroGLYCERIN (NITROSTAT) 0.4 MG SL tablet Place 1 tablet (0.4 mg total) under the tongue every 5 (five) minutes as needed for chest pain. Patient taking  differently: Place 0.4 mg under the tongue every 5 (five) minutes as needed for chest pain. If no relief, call MD 05/15/18  Yes [name], MD  polyethylene glycol (MIRALAX / GLYCOLAX) 17 g packet Take 17 g by mouth daily.   Yes [provider]  rivaroxaban (XARELTO) 20 MG TABS tablet Take 1 tablet (20 mg total) by mouth daily with supper. 01/21/20  Yes Martinique, Peter M, MD  sodium chloride 1 g tablet Take 1 g by mouth 2 (two) times daily with a meal.   Yes [provider]  traMADol (ULTRAM) 50 MG tablet Take 1 tablet (50 mg total) by mouth 3 (three) times daily as needed. Patient taking differently: Take 50 mg by mouth 3 (three) times daily as needed for moderate pain.  06/13/20  Yes Georgette Shell, MD  BAYER MICROLET LANCETS lancets Use to test blood sugar once daily dx: E11.40 10/18/15   [name], MD  Blood Glucose Monitoring Suppl (CONTOUR NEXT EZ MONITOR) w/Device KIT USE TO TEST BLOOD SUGAR ONCE DAILY. Dx Code E11.40 06/13/16   Viviana Simpler I, MD  glucose blood (CONTOUR NEXT TEST) test strip Check blood sugar once daily and as directed.Dx Code E11.40 03/19/19   [name], MD    Physical Exam: Constitutional: Moderately built and nourished. Vitals:   07/05/20 0045 07/05/20 0200 07/05/20 0230 07/05/20 0315  BP: (!) 125/99 103/68 117/69  126/76  Pulse: (!) 105 (!) 136 (!) 138 (!) 118  Resp: _0 Temp:      TempSrc:      SpO2: 98% 97% 98% 98%  Weight:      Height:       Eyes: Anicteric no pallor. ENMT: No discharge from the ears eyes nose or mouth. Neck: No mass felt.  No neck rigidity. Respiratory: No rhonchi or crepitations. Cardiovascular: S1-S2 heard. Abdomen: Soft nontender mildly distended bowel sounds not appreciated. Musculoskeletal: No edema. Skin: No rash. Neurologic: Alert awake oriented to time place and person.  Moves all extremities. Psychiatric: Appears normal.  Normal affect.   Labs on Admission: I have personally reviewed following labs and imaging studies  CBC: Recent Labs  Lab 07/04/20 2237  WBC 6.6  HGB 9.2*  HCT 30.9*  MCV 79.6*  PLT 275   Basic Metabolic Panel: Recent Labs  Lab 07/04/20 2237 07/04/20 2312  NA 138  --   K 2.6*  --   CL 105  --   CO2 20*  --   GLUCOSE 165*  --   BUN 34*  --   CREATININE 1.01  --   CALCIUM 7.7*  --   MG  --  2.2   GFR: Estimated Creatinine Clearance: 50.9 mL/min (by C-G formula based on SCr of 1.01 mg/dL). Liver Function Tests: Recent Labs  Lab 07/04/20 2237  AST 58*  ALT 30  ALKPHOS 564*  BILITOT 0.8  PROT 7.2  ALBUMIN 2.0*   Recent Labs  Lab 07/04/20 2237  LIPASE 18   No results for input(s): AMMONIA in the last 168 hours. Coagulation Profile: No results for input(s): INR, PROTIME in the last 168 hours. Cardiac Enzymes: No results for input(s): CKTOTAL, CKMB, CKMBINDEX, TROPONINI in the last 168 hours. BNP (last 3 results) No results for input(s): PROBNP in the last 8760 hours. HbA1C: No results for input(s): HGBA1C in the last 72 hours. CBG: No results for input(s): GLUCAP in the last 168 hours. Lipid Profile: No results for input(s): CHOL,  HDL, LDLCALC, TRIG, CHOLHDL, LDLDIRECT in the last 72 hours. Thyroid Function Tests: No results for input(s): TSH, T4TOTAL, FREET4, T3FREE, THYROIDAB in the last 72  hours. Anemia Panel: No results for input(s): VITAMINB12, FOLATE, FERRITIN, TIBC, IRON, RETICCTPCT in the last 72 hours. Urine analysis:    Component Value Date/Time   COLORURINE AMBER (A) 07/04/2020 2237   APPEARANCEUR CLEAR 07/04/2020 2237   LABSPEC 1.018 07/04/2020 2237   PHURINE 5.0 07/04/2020 2237   GLUCOSEU NEGATIVE 07/04/2020 2237   HGBUR NEGATIVE 07/04/2020 2237   BILIRUBINUR NEGATIVE 07/04/2020 2237   BILIRUBINUR negative 03/18/2014 [place] 07/04/2020 2237   PROTEINUR 30 (A) 07/04/2020 2237   UROBILINOGEN 1.0 03/31/2014 1228   NITRITE NEGATIVE 07/04/2020 2237   LEUKOCYTESUR NEGATIVE 07/04/2020 2237   Sepsis Labs: _0 (procalcitonin:4,lacticidven:4) ) Recent Results (from the past 240 hour(s))  Respiratory Panel by RT PCR (Flu A&B, Covid) - Nasopharyngeal Swab     Status: None   Collection Time: 07/04/20 10:56 PM   Specimen: Nasopharyngeal Swab  Result Value Ref Range Status   SARS Coronavirus 2 by RT PCR NEGATIVE NEGATIVE Final    Comment: (NOTE) SARS-CoV-2 target nucleic acids are NOT DETECTED.  The SARS-CoV-2 RNA is generally detectable in upper respiratoy specimens during the acute phase of infection. The lowest concentration of SARS-CoV-2 viral copies this assay can detect is 131 copies/mL. A negative result does not preclude SARS-Cov-2 infection and should not be used as the sole basis for treatment or other patient management decisions. A negative result may occur with  improper specimen collection/handling, submission of specimen other than nasopharyngeal swab, presence of viral mutation(s) within the areas targeted by this assay, and inadequate number of viral copies (<131 copies/mL). A negative result must be combined with clinical observations, patient history, and epidemiological information. The expected result is Negative.  Fact Sheet for Patients:  PinkCheek.be  Fact Sheet for Healthcare  Providers:  GravelBags.it  This test is no t yet approved or cleared by the [place] FDA and  has been authorized for detection and/or diagnosis of SARS-CoV-2 by FDA under an Emergency Use Authorization (EUA). This EUA will remain  in effect (meaning this test can be used) for the duration of the COVID-19 declaration under Section 564(b)(1) of the Act, 21 U.S.C. section 360bbb-3(b)(1), unless the authorization is terminated or revoked sooner.     Influenza A by PCR NEGATIVE NEGATIVE Final   Influenza B by PCR NEGATIVE NEGATIVE Final    Comment: (NOTE) The Xpert Xpress SARS-CoV-2/FLU/RSV assay is intended as an aid in  the diagnosis of influenza from Nasopharyngeal swab specimens and  should not be used as a sole basis for treatment. Nasal washings and  aspirates are unacceptable for Xpert Xpress SARS-CoV-2/FLU/RSV  testing.  Fact Sheet for Patients: PinkCheek.be  Fact Sheet for Healthcare Providers: GravelBags.it  This test is not yet approved or cleared by the [place] FDA and  has been authorized for detection and/or diagnosis of SARS-CoV-2 by  FDA under an Emergency Use Authorization (EUA). This EUA will remain  in effect (meaning this test can be used) for the duration of the  Covid-19 declaration under Section 564(b)(1) of the Act, 21  U.S.C. section 360bbb-3(b)(1), unless the authorization is  terminated or revoked. Performed at [facility], [place] [address], [place], [place] [address]      Radiological Exams on Admission: CT ABDOMEN PELVIS W CONTRAST  Result Date: 07/05/2020 CLINICAL DATA:  Suspected bowel obstruction. No bowel movements  in 3 days. EXAM: CT ABDOMEN AND PELVIS WITH CONTRAST TECHNIQUE: Multidetector CT imaging of the abdomen and pelvis was performed using the standard protocol following bolus administration of intravenous contrast. CONTRAST:   197m OMNIPAQUE IOHEXOL 300 MG/ML  SOLN COMPARISON:  05/01/2020 FINDINGS: Lower chest: Mild interstitial pattern to the lung bases likely representing fibrosis. Minimal bilateral pleural effusions. Moderate esophageal hiatal hernia. Coronary artery calcifications. Hepatobiliary: No focal liver abnormality is seen. No gallstones, gallbladder wall thickening, or biliary dilatation. Pancreas: Pancreas is atrophic. Low-attenuation lesion in the pancreatic head appears stable. Calcification in the pancreatic head likely indicates chronic pancreatitis. Spleen: Normal in size without focal abnormality. Adrenals/Urinary Tract: Adrenal glands are unremarkable. Kidneys are normal, without renal calculi, focal lesion, or hydronephrosis. Bladder is unremarkable. Stomach/Bowel: Stomach and small bowel are decompressed. Moderately distended gas and fluid-filled colon and rectum. Changes likely to represent ileus. Infectious process would also be possible. No colonic wall thickening to suggest colitis. Appendix is not identified. Vascular/Lymphatic: Aortic atherosclerosis. No enlarged abdominal or pelvic lymph nodes. Reproductive: Prostate is unremarkable. Other: No free air or free fluid in the abdomen. Abdominal wall musculature appears intact. Musculoskeletal: Prominent degenerative changes throughout the spine. Multiple focal and confluent areas of sclerosis in the visualized skeleton consistent with diffuse bone metastasis. IMPRESSION: 1. Moderately distended gas and fluid-filled colon and rectum likely to represent ileus. Infectious process would also be possible. No evidence of small bowel obstruction. 2. Moderate esophageal hiatal hernia. 3. Minimal bilateral pleural effusions. 4. Diffuse bone metastasis. 5. Stable appearance of low-attenuation lesion in the pancreatic head. 6. Aortic atherosclerosis. 7. Changes of chronic pancreatitis. Aortic Atherosclerosis (ICD10-I70.0). Electronically Signed   By: WLucienne Capers M.D.   On: 07/05/2020 01:22   DG Chest Port 1 View  Result Date: 07/04/2020 CLINICAL DATA:  Shortness of breath EXAM: PORTABLE CHEST 1 VIEW COMPARISON:  06/18/2020 FINDINGS: The heart size and mediastinal contours are within normal limits. Both lungs are clear. The visualized skeletal structures are unremarkable. IMPRESSION: No active disease. Electronically Signed   By: MInez CatalinaM.D.   On: 07/04/2020 23:15    EKG: Independently reviewed.  A. fib with RVR.  Assessment/Plan Principal Problem:   Ileus ([place] Active Problems:   Essential hypertension   Uncontrolled type 2 diabetes mellitus with diabetic polyneuropathy, without long-term current use of insulin (HCC)   CKD stage 3 due to type 2 diabetes mellitus ([place]   History of CVA (cerebrovascular accident)   CAD S/P percutaneous coronary angioplasty   Prostate cancer ([place]   Elevated troponin   Pressure injury of skin   Hypokalemia   Atrial fibrillation with RVR ([place]    1. Abdominal distention with pain with CT scan showing features concerning for ileus presently we will keep patient n.p.o. and the cause for possible ileus could be either diabetes or electrolyte imbalance.  Patient only takes tramadol for pain as needed.  No narcotics.  We will correct potassium and positive for metabolic panel.  If ileus does not improve may need GI input. 2. A. fib with RVR for which I have started patient on Cardizem infusion.  Since patient is n.p.o. we will keep patient on heparin infusion. 3. Severe hypokalemia could be from poor oral intake.  Replace and recheck.  Likely causing ileus.  Check magnesium. 4. History of CHF presently receiving fluids. 5. History of CAD status post stenting in 2017 presently elevated troponin which is showing a flat trend at this time.  Denies any chest pain denies some  shortness of breath.  Presently on Cardizem infusion and heparin infusion.  Otherwise patient is n.p.o. 6. Chronic anemia follow  CBC. 7. Diabetes mellitus type 2 we will keep patient sliding scale coverage. 8. Metastatic prostate cancer. 9. History of CVA with right-sided weakness and dysarthria.  Since patient has ileus with A. fib with RVR will need close monitoring for any further worsening in inpatient status.   DVT prophylaxis: Heparin infusion. Code Status: Full code. Family Communication: Discussed with patient. Disposition Plan: Back to facility when stable. Consults called: None. Admission status: Inpatient.   Rise Patience MD [facility] Pager [phone].  If 7PM-7AM, please contact night-coverage [email or web address] Password [email or web address]  07/05/2020, 3:38 AM

## 2020-07-06 ENCOUNTER — Inpatient Hospital Stay (HOSPITAL_COMMUNITY): Payer: Medicare Other

## 2020-07-06 DIAGNOSIS — K567 Ileus, unspecified: Secondary | ICD-10-CM

## 2020-07-06 DIAGNOSIS — R140 Abdominal distension (gaseous): Secondary | ICD-10-CM

## 2020-07-06 LAB — APTT
aPTT: 95 seconds — ABNORMAL HIGH (ref 24–36)
aPTT: 65 seconds — ABNORMAL HIGH (ref 24–36)
aPTT: 105 seconds — ABNORMAL HIGH (ref 24–36)

## 2020-07-06 LAB — CBC WITH DIFFERENTIAL/PLATELET
Abs Immature Granulocytes: 0.47 10*3/uL — ABNORMAL HIGH (ref 0.00–0.07)
Basophils Absolute: 0 10*3/uL (ref 0.0–0.1)
Basophils Relative: 1 %
Eosinophils Absolute: 0 10*3/uL (ref 0.0–0.5)
Eosinophils Relative: 1 %
HCT: 26.7 % — ABNORMAL LOW (ref 39.0–52.0)
Hemoglobin: 7.6 g/dL — ABNORMAL LOW (ref 13.0–17.0)
Immature Granulocytes: 6 %
Lymphocytes Relative: 20 %
Lymphs Abs: 1.5 10*3/uL (ref 0.7–4.0)
MCH: 23 pg — ABNORMAL LOW (ref 26.0–34.0)
MCHC: 28.5 g/dL — ABNORMAL LOW (ref 30.0–36.0)
MCV: 80.9 fL (ref 80.0–100.0)
Monocytes Absolute: 0.6 10*3/uL (ref 0.1–1.0)
Monocytes Relative: 7 %
Neutro Abs: 5 10*3/uL (ref 1.7–7.7)
Neutrophils Relative %: 65 %
Platelets: 216 10*3/uL (ref 150–400)
RBC: 3.3 MIL/uL — ABNORMAL LOW (ref 4.22–5.81)
RDW: 21.8 % — ABNORMAL HIGH (ref 11.5–15.5)
WBC: 7.6 10*3/uL (ref 4.0–10.5)
nRBC: 0.8 % — ABNORMAL HIGH (ref 0.0–0.2)

## 2020-07-06 LAB — HEPARIN LEVEL (UNFRACTIONATED): Heparin Unfractionated: 1.3 IU/mL — ABNORMAL HIGH (ref 0.30–0.70)

## 2020-07-06 LAB — COMPREHENSIVE METABOLIC PANEL
ALT: 34 U/L (ref 0–44)
AST: 71 U/L — ABNORMAL HIGH (ref 15–41)
Albumin: 2.1 g/dL — ABNORMAL LOW (ref 3.5–5.0)
Alkaline Phosphatase: 501 U/L — ABNORMAL HIGH (ref 38–126)
Anion gap: 11 (ref 5–15)
BUN: 38 mg/dL — ABNORMAL HIGH (ref 8–23)
CO2: 16 mmol/L — ABNORMAL LOW (ref 22–32)
Calcium: 7.7 mg/dL — ABNORMAL LOW (ref 8.9–10.3)
Chloride: 111 mmol/L (ref 98–111)
Creatinine, Ser: 0.92 mg/dL (ref 0.61–1.24)
GFR, Estimated: >60 mL/min (ref >60)
Glucose, Bld: 78 mg/dL (ref 70–99)
Potassium: 4 mmol/L (ref 3.5–5.1)
Sodium: 138 mmol/L (ref 135–145)
Total Bilirubin: 0.8 mg/dL (ref 0.3–1.2)
Total Protein: 6.9 g/dL (ref 6.5–8.1)

## 2020-07-06 LAB — RETICULOCYTES
Immature Retic Fract: 28.9 % — ABNORMAL HIGH (ref 2.3–15.9)
RBC.: 3.24 MIL/uL — ABNORMAL LOW (ref 4.22–5.81)
Retic Count, Absolute: 42.1 10*3/uL (ref 19.0–186.0)
Retic Ct Pct: 1.3 % (ref 0.4–3.1)

## 2020-07-06 LAB — HEMOGLOBIN AND HEMATOCRIT, BLOOD
HCT: 26.5 % — ABNORMAL LOW (ref 39.0–52.0)
Hemoglobin: 8.1 g/dL — ABNORMAL LOW (ref 13.0–17.0)

## 2020-07-06 LAB — GLUCOSE, CAPILLARY
Glucose-Capillary: 132 mg/dL — ABNORMAL HIGH (ref 70–99)
Glucose-Capillary: 65 mg/dL — ABNORMAL LOW (ref 70–99)
Glucose-Capillary: 107 mg/dL — ABNORMAL HIGH (ref 70–99)
Glucose-Capillary: 102 mg/dL — ABNORMAL HIGH (ref 70–99)
Glucose-Capillary: 114 mg/dL — ABNORMAL HIGH (ref 70–99)
Glucose-Capillary: 151 mg/dL — ABNORMAL HIGH (ref 70–99)

## 2020-07-06 LAB — IRON AND TIBC
Iron: 60 ug/dL (ref 45–182)
Saturation Ratios: 40 % — ABNORMAL HIGH (ref 17.9–39.5)
TIBC: 150 ug/dL — ABNORMAL LOW (ref 250–450)
UIBC: 90 ug/dL

## 2020-07-06 LAB — FERRITIN: Ferritin: 3713 ng/mL — ABNORMAL HIGH (ref 24–336)

## 2020-07-06 LAB — VITAMIN B12: Vitamin B-12: 2429 pg/mL — ABNORMAL HIGH (ref 180–914)

## 2020-07-06 LAB — FOLATE: Folate: 13.4 ng/mL (ref >5.9)

## 2020-07-06 LAB — TROPONIN I (HIGH SENSITIVITY)
Troponin I (High Sensitivity): 91 ng/L — ABNORMAL HIGH (ref <18)
Troponin I (High Sensitivity): 81 ng/L — ABNORMAL HIGH (ref <18)
Troponin I (High Sensitivity): 86 ng/L — ABNORMAL HIGH (ref <18)

## 2020-07-06 LAB — MAGNESIUM: Magnesium: 2.5 mg/dL — ABNORMAL HIGH (ref 1.7–2.4)

## 2020-07-06 LAB — PHOSPHORUS: Phosphorus: 1.7 mg/dL — ABNORMAL LOW (ref 2.5–4.6)

## 2020-07-06 MED ORDER — HEPARIN (PORCINE) 25000 UT/250ML-% IV SOLN
1000.0000 [IU]/h | INTRAVENOUS | Status: DC
  Administered 2020-07-06: 1000 [IU]/h via INTRAVENOUS
  Filled 2020-07-06: qty 250

## 2020-07-06 MED ORDER — NITROGLYCERIN 0.4 MG SL SUBL
0.4000 mg | SUBLINGUAL_TABLET | SUBLINGUAL | Status: DC | PRN
  Filled 2020-07-06: qty 1

## 2020-07-06 MED ORDER — SODIUM BICARBONATE 8.4 % IV SOLN
INTRAVENOUS | Status: AC
  Filled 2020-07-06: qty 850
  Filled 2020-07-06: qty 150

## 2020-07-06 MED ORDER — DICLOFENAC SODIUM 1 % EX GEL
2.0000 g | Freq: Four times a day (QID) | CUTANEOUS | Status: DC
  Administered 2020-07-06 – 2020-07-15 (×32): 2 g via TOPICAL
  Filled 2020-07-06: qty 100

## 2020-07-06 MED ORDER — HEPARIN (PORCINE) 25000 UT/250ML-% IV SOLN: 1050.0000 [IU]/h | INTRAVENOUS | Status: DC

## 2020-07-06 MED ORDER — HYDRALAZINE HCL 20 MG/ML IJ SOLN: 5.0000 mg | Freq: Four times a day (QID) | INTRAMUSCULAR | Status: DC | PRN

## 2020-07-06 MED ORDER — LIDOCAINE 5 % EX PTCH
1.0000 | MEDICATED_PATCH | CUTANEOUS | Status: DC
  Administered 2020-07-06 – 2020-07-14 (×8): 1 via TRANSDERMAL
  Filled 2020-07-06 (×11): qty 1

## 2020-07-06 MED ORDER — MORPHINE SULFATE (PF) 2 MG/ML IV SOLN
2.0000 mg | INTRAVENOUS | Status: DC | PRN
  Administered 2020-07-06 – 2020-07-07 (×4): 2 mg via INTRAVENOUS
  Filled 2020-07-06 (×4): qty 1

## 2020-07-06 MED ORDER — SODIUM PHOSPHATES 45 MMOLE/15ML IV SOLN
20.0000 mmol | Freq: Once | INTRAVENOUS | Status: AC
  Administered 2020-07-06: 20 mmol via INTRAVENOUS
  Filled 2020-07-06: qty 6.67

## 2020-07-06 MED ORDER — DEXTROSE 50 % IV SOLN
12.5000 g | INTRAVENOUS | Status: AC
  Administered 2020-07-06: 12.5 g via INTRAVENOUS
  Filled 2020-07-06: qty 50

## 2020-07-06 NOTE — Progress Notes (Signed)
PROGRESS NOTE    Adrian Singh  GEX:[id number] DOB: 1934/08/02 DOA: 07/04/2020 PCP: [name], MD  Chief Complaint  Patient presents with  . Abdominal Pain   Brief Narrative:  Adrian Singh 86 y.o.WM PMHx CAD status post stenting, Jakita Dutkiewicz. 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 Denies any vomiting fever or chills. Abdominal discomfort is generalized. Was able to take his medications.  ED Course:In the ER patient was in [place]. fib with RVR. CT abdomen pelvis shows features concerning for possible ileus. Labs are significant for high sensitive troponin of 8712. Hemoglobin is 9.2 potassium was low at 2.6 Covid test was negative. Patient was started on potassium replacement and also since patient is in Salvatore Shear. fib with RVR Cardizem bolus was initially given then started on Cardizem infusion. Admitted for ileus and Beckie Viscardi. fib with RVR. Denies any chest pain. But did complain of some shortness of breath. Chest x-ray did not show any infiltrates.  Assessment & Plan:   Principal Problem:   Ileus ([place]) Active Problems:   Essential hypertension   Uncontrolled type 2 diabetes mellitus with diabetic polyneuropathy, without long-term current use of insulin (HCC)   CKD stage 3 due to type 2 diabetes mellitus ([place])   History of CVA (cerebrovascular accident)   CAD S/P percutaneous coronary angioplasty   Prostate cancer (HCC)   Elevated troponin   Pressure  injury of skin   Hypokalemia   Atrial fibrillation with RVR (HCC)  Ileus - CT scan consistent with ileus -- continued abdominal distension today -- KUB 10/20 with increased gaseous distension of colon -- will consider repeat CT scan if patient with worsening pain/symptoms -- replace mag/K as needed to keep >2/4 respectively -- will discuss with GI  Hypokalemia  Hypophosphatemia - replace and follow   Chronic pain syndrome -Morphine PRN --at home is on tramadol per med rec  Chronic systolic CHF -Strict in and out -Daily weight  --caution with IVF, takes 40 mg lasix daily as need at home  Elevated troponin -- elevated, flat likely 2/2 demand from above -- EKG without changes concerning for ACS, appears similar to priors -- will continue to monitor   Fib with RVR -Cardizem drip -Currently NSR -Heparin drip per pharmacy  Chronic anemia -labs suggestive of AOCD - hypoprolieferative retics - downtrending, will continue to monitor, no si/sx bleeding  NAGMA - worsening slightly, possibly 2/2 NS resuscitation/maintenance fluids - hyperchloremic - change fluids to bicarb and follow  DM type II controlled with complication; DM gastroparesis  Hypoglycemia -10/6 hemoglobin A1c= 6.5 -Sensitive SSI -- follow with D5 above  Metastatic prostate cancer  Hx CVA with residual RIGHT sided weakness and dysarthria  DVT prophylaxis: heparin  Code Status: full  Family Communication: none at bedside Disposition:   Status is: Inpatient  Remains inpatient appropriate because:Inpatient level of care appropriate due to severity of illness   Dispo: The patient is from: Home              Anticipated d/c is to: pending  Anticipated d/c date is: > 3 days              Patient currently is not medically stable to d/c.    Consultants:   GI  Procedures:  none  Antimicrobials:  Anti-infectives (From admission, onward)   None     Subjective: C/o back pain,  epigastric discomfort  Objective: Vitals:   07/06/20 0700 07/06/20 0800 07/06/20 1000 07/06/20 1100  BP: (!) 159/74 (!) 155/64 (!) 170/71 (!) 159/75  Pulse: 67 80 80 81  Resp: 16 18 17 17   Temp:  (!) 97.3 F (36.3 C)    TempSrc:  Oral    SpO2: 98% 98% 99% 99%  Weight:      Height:        Intake/Output Summary (Last 24 hours) at 07/06/2020 1321 Last data filed at 07/06/2020 0700 Gross per 24 hour  Intake 1854.82 ml  Output 400 ml  Net 1454.82 ml   Filed Weights   07/04/20 2240 07/05/20 0600 07/06/20 0417  Weight: 65 kg 67 kg 68.5 kg    Examination:  General exam: Appears calm and comfortable  Respiratory system: Clear to auscultation. Respiratory effort normal. Cardiovascular system: S1 & S2 heard, RRR.  Gastrointestinal system: Abdomen is distended, nontender Central nervous system: Alert and oriented. No focal neurological deficits. Extremities: no LEE Skin: No rashes, lesions or ulcers Psychiatry: Judgement and insight appear normal. Mood & affect appropriate.     Data Reviewed: I have personally reviewed following labs and imaging studies  CBC: Recent Labs  Lab 07/04/20 2237 07/06/20 0314 07/06/20 1127  WBC 6.6 7.6  --   NEUTROABS  --  5.0  --   HGB 9.2* 7.6* 8.1*  HCT 30.9* 26.7* 26.5*  MCV 79.6* 80.9  --   PLT 219 216  --     Basic Metabolic Panel: Recent Labs  Lab 07/04/20 2237 07/04/20 2312 07/05/20 0402 07/05/20 0945 07/06/20 0314  NA 138  --  138 138 138  K 2.6*  --  2.9* 3.1* 4.0  CL 105  --  105 107 111  CO2 20*  --  18* 17* 16*  GLUCOSE 165*  --  164* 171* 78  BUN 34*  --  35* 39* 38*  CREATININE 1.01  --  0.91 0.99 0.92  CALCIUM 7.7*  --  7.5* 7.7* 7.7*  MG  --  2.2 2.2 2.3 2.5*  PHOS  --   --   --  2.6 1.7*    GFR: Estimated Creatinine Clearance: 58.9 mL/min (by C-G formula based on SCr of 0.92 mg/dL).  Liver Function Tests: Recent Labs  Lab 07/04/20 2237 07/05/20 0402 07/06/20 0314  AST 58* 73* 71*  ALT 30 37 34    ALKPHOS 564* 491* 501*  BILITOT 0.8 0.8 0.8  PROT 7.2 6.7 6.9  ALBUMIN 2.0* 1.8* 2.1*    CBG: Recent Labs  Lab 07/05/20 2347 07/06/20 0349 07/06/20 0428 07/06/20 0857 07/06/20 1158  GLUCAP 71 65* 132* 107* 102*     Recent Results (from the past 240 hour(s))  Respiratory Panel by RT PCR (Flu Armya Westerhoff&B, Covid) - Nasopharyngeal Swab     Status: None   Collection Time: 07/04/20 10:56 PM   Specimen: Nasopharyngeal Swab  Result Value Ref Range Status   SARS Coronavirus 2 by RT PCR NEGATIVE NEGATIVE Final    Comment: (NOTE) SARS-CoV-2 target nucleic acids are NOT DETECTED.  The SARS-CoV-2 RNA is generally detectable in upper respiratoy specimens during the acute phase  of infection. The lowest concentration of SARS-CoV-2 viral copies this assay can detect is 131 copies/mL. Marranda Arakelian negative result does not preclude SARS-Cov-2 infection and should not be used as the sole basis for treatment or other patient management decisions. Moris Ratchford negative result may occur with  improper specimen collection/handling, submission of specimen other than nasopharyngeal swab, presence of viral mutation(s) within the areas targeted by this assay, and inadequate number of viral copies (<131 copies/mL). Curtisha Bendix negative result must be combined with clinical observations, patient history, and epidemiological information. The expected result is Negative.  Fact Sheet for Patients:  PinkCheek.be  Fact Sheet for Healthcare Providers:  GravelBags.it  This test is no t yet approved or cleared by the [place] FDA and  has been authorized for detection and/or diagnosis of SARS-CoV-2 by FDA under an Emergency Use Authorization (EUA). This EUA will remain  in effect (meaning this test can be used) for the duration of the COVID-19 declaration under Section 564(b)(1) of the Act, 21 U.S.C. section 360bbb-3(b)(1), unless the authorization is terminated or revoked  sooner.     Influenza Eddy Termine by PCR NEGATIVE NEGATIVE Final   Influenza B by PCR NEGATIVE NEGATIVE Final    Comment: (NOTE) The Xpert Xpress SARS-CoV-2/FLU/RSV assay is intended as an aid in  the diagnosis of influenza from Nasopharyngeal swab specimens and  should not be used as Emile Ringgenberg sole basis for treatment. Nasal washings and  aspirates are unacceptable for Xpert Xpress SARS-CoV-2/FLU/RSV  testing.  Fact Sheet for Patients: PinkCheek.be  Fact Sheet for Healthcare Providers: GravelBags.it  This test is not yet approved or cleared by the [place] FDA and  has been authorized for detection and/or diagnosis of SARS-CoV-2 by  FDA under an Emergency Use Authorization (EUA). This EUA will remain  in effect (meaning this test can be used) for the duration of the  Covid-19 declaration under Section 564(b)(1) of the Act, 21  U.S.C. section 360bbb-3(b)(1), unless the authorization is  terminated or revoked. Performed at [facility], [place] [address], [place], [place] [address]   MRSA PCR Screening     Status: None   Collection Time: 07/05/20  6:08 AM   Specimen: Nasopharyngeal  Result Value Ref Range Status   MRSA by PCR NEGATIVE NEGATIVE Final    Comment:        The GeneXpert MRSA Assay (FDA approved for NASAL specimens only), is one component of [place] comprehensive MRSA colonization surveillance program. It is not intended to diagnose MRSA infection nor to guide or monitor treatment for MRSA infections. Performed at [facility], [place] [address] [place], [place] [address]          Radiology Studies: DG Abd 1 View  Result Date: 07/06/2020 CLINICAL DATA:  Abdominal pain EXAM: ABDOMEN - 1 VIEW COMPARISON:  CT abdomen and pelvis 07/05/2020 FINDINGS: Diffuse gaseous distention of colon increased from previous exam. This includes extensive gaseous distension of the sigmoid loop. No  definite gas in rectum or bowel wall thickening identified. Increased stool in RIGHT colon. Small bowel decompressed. Osseous demineralization with degenerative disc/facet disease changes and dextroconvex scoliosis lumbar spine. No urinary tract calcification definitely visualized. Scattered pelvic phleboliths. IMPRESSION: Gaseous distention of colon increased from previous exam, especially sigmoid loop. No evidence of sigmoid volvulus was identified on the prior CT. Findings favor diffuse colonic ileus; consider repeat CT if patient's symptoms persist or worsen. Electronically Signed   By: Lavonia Dana M.D.   On: 07/06/2020 10:36   CT  ABDOMEN PELVIS W CONTRAST  Result Date: 07/05/2020 CLINICAL DATA:  Suspected bowel obstruction. No bowel movements in 3 days. EXAM: CT ABDOMEN AND PELVIS WITH CONTRAST TECHNIQUE: Multidetector CT imaging of the abdomen and pelvis was performed using the standard protocol following bolus administration of intravenous contrast. CONTRAST:  159mL OMNIPAQUE IOHEXOL 300 MG/ML  SOLN COMPARISON:  05/01/2020 FINDINGS: Lower chest: Mild interstitial pattern to the lung bases likely representing fibrosis. Minimal bilateral pleural effusions. Moderate esophageal hiatal hernia. Coronary artery calcifications. Hepatobiliary: No focal liver abnormality is seen. No gallstones, gallbladder wall thickening, or biliary dilatation. Pancreas: Pancreas is atrophic. Low-attenuation lesion in the pancreatic head appears stable. Calcification in the pancreatic head likely indicates chronic pancreatitis. Spleen: Normal in size without focal abnormality. Adrenals/Urinary Tract: Adrenal glands are unremarkable. Kidneys are normal, without renal calculi, focal lesion, or hydronephrosis. Bladder is unremarkable. Stomach/Bowel: Stomach and small bowel are decompressed. Moderately distended gas and fluid-filled colon and rectum. Changes likely to represent ileus. Infectious process would also be possible. No  colonic wall thickening to suggest colitis. Appendix is not identified. Vascular/Lymphatic: Aortic atherosclerosis. No enlarged abdominal or pelvic lymph nodes. Reproductive: Prostate is unremarkable. Other: No free air or free fluid in the abdomen. Abdominal wall musculature appears intact. Musculoskeletal: Prominent degenerative changes throughout the spine. Multiple focal and confluent areas of sclerosis in the visualized skeleton consistent with diffuse bone metastasis. IMPRESSION: 1. Moderately distended gas and fluid-filled colon and rectum likely to represent ileus. Infectious process would also be possible. No evidence of small bowel obstruction. 2. Moderate esophageal hiatal hernia. 3. Minimal bilateral pleural effusions. 4. Diffuse bone metastasis. 5. Stable appearance of low-attenuation lesion in the pancreatic head. 6. Aortic atherosclerosis. 7. Changes of chronic pancreatitis. Aortic Atherosclerosis (ICD10-I70.0). Electronically Signed   By: Lucienne Capers M.D.   On: 07/05/2020 01:22   DG Chest Port 1 View  Result Date: 07/04/2020 CLINICAL DATA:  Shortness of breath EXAM: PORTABLE CHEST 1 VIEW COMPARISON:  06/18/2020 FINDINGS: The heart size and mediastinal contours are within normal limits. Both lungs are clear. The visualized skeletal structures are unremarkable. IMPRESSION: No active disease. Electronically Signed   By: Inez Catalina M.D.   On: 07/04/2020 23:15        Scheduled Meds: . Chlorhexidine Gluconate Cloth  6 each Topical Daily  . feeding supplement  237 mL Oral BID BM  . insulin aspart  0-9 Units Subcutaneous Q4H   Continuous Infusions: . diltiazem (CARDIZEM) infusion 5 mg/hr (07/06/20 0943)  . heparin 1,050 Units/hr (07/06/20 1202)  . sodium phosphate  Dextrose 5% IVPB       LOS: 1 day    Time spent: over 30 min    Fayrene Helper, MD [facility]   To contact the attending provider between 7A-7P or the covering provider during after hours 7P-7A,  please log into the web site [email or web address] and access using universal [place] password for that web site. If you do not have the password, please call the hospital operator.  07/06/2020, 1:21 PM

## 2020-07-06 NOTE — Progress Notes (Signed)
Attempted to call report to 4th floor x2. Awaiting call back to give report.

## 2020-07-06 NOTE — Progress Notes (Signed)
[place] for heparin Indication: atrial fibrillation  Allergies  Allergen Reactions  . Soma [Carisoprodol] Other (See Comments)    "did a number on me"  . Doxazosin Other (See Comments)    dizziness    Patient Measurements: Height: 5\' 11"  (180.3 cm) Weight: 68.5 kg (151 lb 0.2 oz) IBW/kg (Calculated) : 75.3 Heparin Dosing Weight: 65kg  Vital Signs: Temp: 97.3 F (36.3 C) (10/20 0800) Temp Source: Oral (10/20 0800) BP: 155/64 (10/20 0800) Pulse Rate: 80 (10/20 0800)  Labs: Recent Labs    07/04/20 2237 07/04/20 2237 07/04/20 2256 07/05/20 0056 07/05/20 0402 07/05/20 0945 07/06/20 0314 07/06/20 1127  HGB 9.2*   < >  --   --   --   --  7.6* 8.1*  HCT 30.9*  --   --   --   --   --  26.7* 26.5*  PLT 219  --   --   --   --   --  216  --   APTT  --   --   --   --  44*  --  95* 65*  HEPARINUNFRC  --   --   --   --  >2.20*  --   --   --   CREATININE 1.01   < >  --   --  0.91 0.99 0.92  --   TROPONINIHS  --   --  87* 112* 107*  --   --   --    < > = values in this interval not displayed.    Estimated Creatinine Clearance: 58.9 mL/min (by C-G formula based on SCr of 0.92 mg/dL).   Assessment: 86 year old male with history of metastatic prostate cancer, atrial fibrillation anticoagulated on rivaroxaban, diabetes comes in with abdominal pain and distention and no bowel movement for the last 3 days.  Pharmacy consulted to dose heparin for AFib.  LD xarelto 10/18 @ 1800  Baseline aPTT 44 and HL >2.2  07/06/2020 First APTT 95 therapeutic Confirmatory aPTT 65 - at low end of goal Hgb 9.6>7.6> 8.1 plts 216 No bleeding noted KUB: Findings favor diffuse colonic ileus  Goal of Therapy:  Heparin level 0.3-0.7 units/ml  APTT 66-102 seconds Monitor platelets by anticoagulation protocol   Plan:  increase Heparin drip to 1050 units/hr APTT/heparin level  8 hours after rate increase Daily CBC, daily aPTT/heparin level F/u ileus  resolution and ability to resume PTA Xarelto  Eudelia Bunch, Pharm.D 07/06/2020 11:52 AM

## 2020-07-06 NOTE — Progress Notes (Signed)
[place] for heparin Indication: atrial fibrillation  Allergies  Allergen Reactions  . Soma [Carisoprodol] Other (See Comments)    "did a number on me"  . Doxazosin Other (See Comments)    dizziness    Patient Measurements: Height: 5\' 11"  (180.3 cm) Weight: 68.5 kg (151 lb 0.2 oz) IBW/kg (Calculated) : 75.3 Heparin Dosing Weight: 65kg  Vital Signs: Temp: 96.6 F (35.9 C) (10/20 0400) Temp Source: Axillary (10/20 0400) BP: 155/64 (10/20 0400) Pulse Rate: 67 (10/20 0400)  Labs: Recent Labs    07/04/20 2237 07/04/20 2237 07/04/20 2256 07/05/20 0056 07/05/20 0402 07/05/20 0945 07/06/20 0314  HGB 9.2*  --   --   --   --   --  7.6*  HCT 30.9*  --   --   --   --   --  26.7*  PLT 219  --   --   --   --   --  216  APTT  --   --   --   --  44*  --  95*  HEPARINUNFRC  --   --   --   --  >2.20*  --   --   CREATININE 1.01   < >  --   --  0.91 0.99 0.92  TROPONINIHS  --   --  87* 112* 107*  --   --    < > = values in this interval not displayed.    Estimated Creatinine Clearance: 58.9 mL/min (by C-G formula based on SCr of 0.92 mg/dL).   Medical History: Past Medical History:  Diagnosis Date  . Arthritis    "legs, back" (10/06/2015)  . Atrial fibrillation ([place])    x1  . Atrial fibrillation ([place])   . CAD (coronary artery disease)    nonobstructive  . Cancer ([place])    skin cancer on ear (froze it off) and back (cut it off)  . Dysrhythmia   . Esophageal reflux    hx of  . History of kidney stones   . Hx of heart artery stent   . Hypertrophy of prostate without urinary obstruction and other lower urinary tract symptoms (LUTS)   . Osteoarthrosis, unspecified whether generalized or localized, unspecified site   . Prostate cancer ([place])   . Skin cancer   . Stroke [facility]) 03/2014   "had a series of mini strokes; maybe 4"; denies residual on 10/06/2015  . Thrombocytopenia ([place])   . Type II diabetes mellitus ([place])    type 2  .  Unspecified essential hypertension      Assessment: 86 year old male with history of metastatic prostate cancer, atrial fibrillation anticoagulated on rivaroxaban, diabetes comes in with abdominal pain and distention and no bowel movement for the last 3 days.  Pharmacy consulted to dose heparin for AFib.  LD xarelto 10/18 @ 1800  Baseline aPTT 44 and HL >2.2  07/06/2020 APTT 95 therapeutic Hgb down to 7.6 plts 216 No bleeding noted   Goal of Therapy:  Heparin level 0.3-0.7 units/ml  APTT 66-102 seconds Monitor platelets by anticoagulation protocol   Plan:   continue Heparin drip at 950 units/hr APTT in 8 hours  Daily CBC   Dolly Rias RPh 07/06/2020, 5:25 AM

## 2020-07-06 NOTE — TOC Initial Note (Signed)
Transition of Care [facility]) - Initial/Assessment Note    Patient Details  Name: Adrian Singh MRN: [id number] Date of Birth: 1934/09/12  Transition of Care [facility]) CM/SW Contact:    Leeroy Cha, RN Phone Number: 07/06/2020, 9:12 AM  Clinical Narrative:                 Patient coming from: Skilled nursing facility.  Chief Complaint: Abdominal pain.  HPI: Adrian Singh is a 86 y.o. male with history of CAD status post stenting, A. fib, diabetes mellitus type 2, chronic anemia, history of stroke with dysarthria and right-sided weakness recently admitted 10 days ago for orthostatic hypotension at that time patient had some medication changes presents to the ER with complaints of 2 days of increasing abdominal discomfort with distention last bowel movement was more than 24 hours ago.  Denies any vomiting fever or chills.  Abdominal discomfort is generalized.  Was able to take his medications.  ED Course: In the ER patient was in A. fib with RVR.  CT abdomen pelvis shows features concerning for possible ileus.  Labs are significant for high sensitive troponin of 8712.  Hemoglobin is 9.2 potassium was low at 2.6 Covid test was negative.  Patient was started on potassium replacement and also since patient is in A. fib with RVR Cardizem bolus was initially given then started on Cardizem infusion.  Admitted for ileus and A. fib with RVR.  Denies any chest pain.  But did complain of some shortness of breath.  Chest x-ray did not show any infiltrates. Iv cardizem, iv heparin, hgb 7.6 102021. Following for progression and return to snf.  [place] care. Expected Discharge Plan: Skilled Nursing Facility Barriers to Discharge: Barriers Unresolved (comment) (a.fib and ielus)   Patient Goals and CMS Choice Patient states their goals for this hospitalization and ongoing recovery are:: not stated   Choice offered to / list presented to : Adult Children  Expected Discharge Plan and  Services Expected Discharge Plan: [place]   Discharge Planning Services: CM Consult Post Acute Care Choice: [place] Living arrangements for the past 2 months: [place] Expected Discharge Date:  (unknown)                                    Prior Living Arrangements/Services Living arrangements for the past 2 months: [place] Lives with:: Facility Resident Patient language and need for interpreter reviewed:: Yes Do you feel safe going back to the place where you live?: Yes      Need for Family Participation in Patient Care: Yes (Comment) Care giver support system in place?: Yes (comment)   Criminal Activity/Legal Involvement Pertinent to Current Situation/Hospitalization: No - Comment as needed  Activities of Daily Living Home Assistive Devices/Equipment: CBG Meter, Walker (specify type), Wheelchair, Dentures (specify type), Other (Comment) (Upper/lower dentures-[facility] has necessary equipment for their patients (At patient's home he has a front wheeled walker, quad cane, bedside commode, wheelchair)) ADL Screening (condition at time of admission) Patient's cognitive ability adequate to safely complete daily activities?: Yes Is the patient deaf or have difficulty hearing?: No Does the patient have difficulty seeing, even when wearing glasses/contacts?: No Does the patient have difficulty concentrating, remembering, or making decisions?: No Patient able to express need for assistance with ADLs?: Yes Does the patient have difficulty dressing or bathing?: Yes Independently performs ADLs?: No Communication: Independent  Dressing (OT): Needs assistance Is this a change from baseline?: Pre-admission baseline Grooming: Needs assistance Is this a change from baseline?: Pre-admission baseline Feeding: Needs assistance Is this a change from baseline?: Pre-admission baseline Bathing: Needs assistance Is this  a change from baseline?: Pre-admission baseline Toileting: Needs assistance Is this a change from baseline?: Pre-admission baseline In/Out Bed: Needs assistance Is this a change from baseline?: Pre-admission baseline Walks in Home: Needs assistance Is this a change from baseline?: Pre-admission baseline Does the patient have difficulty walking or climbing stairs?: Yes (secondary to weakness) Weakness of Legs: Both Weakness of Arms/Hands: None  Permission Sought/Granted                  Emotional Assessment Appearance:: Appears younger than stated age Attitude/Demeanor/Rapport: Engaged Affect (typically observed): Calm Orientation: : Oriented to Self, Oriented to Place, Oriented to  Time, Oriented to Situation Alcohol / Substance Use: Not Applicable Psych Involvement: No (comment)  Admission diagnosis:  Hypokalemia [E87.6] Ileus (HCC) [K56.7] Elevated troponin [R77.8] Patient Active Problem List   Diagnosis Date Noted  . Ileus ([place]) 07/05/2020  . Hypokalemia 07/05/2020  . Atrial fibrillation with RVR ([place]) 07/05/2020  . Postural dizziness with presyncope 06/09/2020  . Protein-calorie malnutrition, severe 05/06/2020  . Palliative care by specialist   . Goals of care, counseling/discussion   . DNR (do not resuscitate) discussion   . FTT (failure to thrive) in adult   . Need for emotional support   . Pressure injury of skin 05/04/2020  . Stroke ([place]) 05/02/2020  . Generalized weakness 04/30/2020  . Fall at home, initial encounter 04/30/2020  . Acute on chronic anemia 04/30/2020  . Elevated CK 04/30/2020  . Chronic kidney disease, stage 3a ([place]) 04/30/2020  . Elevated troponin 04/30/2020  . Fatigue 04/29/2020  . Malnutrition of mild degree ([place]) 04/06/2019  . Diabetic foot ulcer ([place]) 04/06/2019  . Mood disorder ([place]) 04/06/2019  . Aortic atherosclerosis ([place]) 02/13/2017  . Prostate cancer ([place]) 02/13/2017  . Primary malignant neoplasm of prostate metastatic to bone  ([place]) 02/13/2017  . Orthostatic hypotension 03/19/2016  . CAD S/P percutaneous coronary angioplasty 02/28/2016  . Atherosclerosis of native coronary artery of native heart with angina pectoris ([place])   . Abnormal nuclear stress test 09/22/2015  . Thrombocytopenia ([place])   . Advanced directives, counseling/discussion 09/27/2014  . PAF (paroxysmal atrial fibrillation) ([place]) 04/12/2014  . History of CVA (cerebrovascular accident) 03/31/2014  . CKD stage 3 due to type 2 diabetes mellitus ([place])   . Uncontrolled type 2 diabetes mellitus with diabetic polyneuropathy, without long-term current use of insulin ([place]) 11/05/2011  . Routine general medical examination at a health care facility 05/07/2011  . Essential hypertension 12/23/2009  . GERD 12/23/2009  . BPH (benign prostatic hyperplasia) 12/23/2009  . OSTEOARTHRITIS 12/23/2009   PCP:  [name], MD Pharmacy:   [phone] [address], [place] [address] Phone: [phone] Fax: [phone]  [place] [address] [place] - [place] [address] Phone: [phone] Fax: [phone]  [facility] [address] [place], [place] [facility] AT [facility] [address] [place] [address] Phone: [phone] Fax: [phone]     Social Determinants of Health ([place]) Interventions    Readmission Risk Interventions Readmission Risk Prevention Plan 06/20/2020 06/11/2020  Transportation Screening Complete Complete  Medication Review (RN Care Manager) Complete Complete  PCP or Specialist appointment within 3-5 days of  discharge Complete -  HRI or Home Care Consult Complete -  SW Recovery Care/Counseling Consult Complete Complete  Palliative Care Screening Not Applicable Not Applicable  Skilled Nursing Facility Complete Complete  Some recent data might be hidden

## 2020-07-06 NOTE — Progress Notes (Signed)
[place] for heparin Indication: atrial fibrillation  Allergies  Allergen Reactions  . Soma [Carisoprodol] Other (See Comments)    "did a number on me"  . Doxazosin Other (See Comments)    dizziness    Patient Measurements: Height: 5\' 11"  (180.3 cm) Weight: 68.5 kg (151 lb 0.2 oz) IBW/kg (Calculated) : 75.3 Heparin Dosing Weight: 65kg  Vital Signs: Temp: 97.6 F (36.4 C) (10/20 2016) Temp Source: Oral (10/20 2016) BP: 152/69 (10/20 2016) Pulse Rate: 85 (10/20 2016)  Labs: Recent Labs    07/04/20 2237 07/04/20 2237 07/04/20 2256 07/05/20 0402 07/05/20 0402 07/05/20 0945 07/06/20 0314 07/06/20 1127 07/06/20 1248 07/06/20 1523 07/06/20 1717 07/06/20 1957  HGB 9.2*   < >  --   --   --   --  7.6* 8.1*  --   --   --   --   HCT 30.9*  --   --   --   --   --  26.7* 26.5*  --   --   --   --   PLT 219  --   --   --   --   --  216  --   --   --   --   --   APTT  --    < >  --  44*  --   --  95* 65*  --   --   --  105*  HEPARINUNFRC  --   --   --  >2.20*  --   --   --   --   --   --   --   --   CREATININE 1.01  --    < > 0.91  --  0.99 0.92  --   --   --   --   --   TROPONINIHS  --   --    < > 107*   < >  --   --   --  91* 81* 86*  --    < > = values in this interval not displayed.    Estimated Creatinine Clearance: 58.9 mL/min (by C-G formula based on SCr of 0.92 mg/dL).   Assessment: 86 year old male with history of metastatic prostate cancer, atrial fibrillation anticoagulated on rivaroxaban, diabetes comes in with abdominal pain and distention and no bowel movement for the last 3 days.  Pharmacy consulted to dose heparin for AFib.  LD xarelto 10/18 @ 1800  Baseline aPTT 44 and HL >2.2  07/06/2020 9:30 PM  APTT 105 is slightly supratherapeutic  No bleeding or complications with IV reported per discussion with RN  Goal of Therapy:  Heparin level 0.3-0.7 units/ml  APTT 66-102 seconds Monitor platelets by anticoagulation  protocol   Plan:  Decrease Heparin drip to 1000 units/hr APTT/heparin level  8 hours after rate increase Daily CBC, daily aPTT/heparin level F/u ileus resolution and ability to resume PTA [place], PharmD, BCPS Pharmacy: [phone] 07/06/2020 9:30 PM

## 2020-07-06 NOTE — Consult Note (Addendum)
Referring Provider: Dr. Melven Sartorius.  Primary Care Physician:  [name], MD Primary Gastroenterologist:  Dr. Ardis Hughs   Reason for Consultation:  ileus  HPI: Adrian Singh is a 86 y.o. 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  He was admitted to the hospital 9/23-9/27 and 10/2-10/8 due to having orthostatic hypotension.  He developed generalized abdominal pain with associated abdominal distention without a bowel movement for 3 days.  He was transported by EMS from [facility]F to [facility] ED on 07/04/2020.  In the ED, he was in A. fib with RVR.  Laboratory studies showed an elevated troponin level of 8712.  Hemoglobin 9.2.  Potassium 2.6.  He received potassium replacement and he received a Cardizem IV bolus followed by an infusion.  Abdominal/pelvic CT showed a moderate hiatal hernia, atrophic pancreas with a calcification in the head of the pancreas suggestive of chronic pancreatitis and a possible colonic ileus.  No evidence of a bowel obstruction. An abdominal xray today showed worsening gaseous distension specifically to the sigmoid loop. A GI consult was requested for further evaluation regarding his colonic ileus.   [name] is present. She stated she saw her father at [facility] on Monday 10/18 and she notice the support belt the staff use to lift him up from the chair was still secured tightly around his waist when he was in bed. She is unsure how long the lift/support belt had been in place. She stated since then, he complained of abdominal distension and pain. He has frequent constipation. He goes 3 to 4 days without a BM then passes small brown stools with loose to watery stools. No rectal bleeding or black stools.   He reported passing a small bowel movement earlier this morning.  He is passing small amounts of gas per the rectum.  His appetite has been decreased for several months.  Some nausea at times.  No vomiting.  His daughter reports he has lost 40lbs in the past year. No dysphagia or heartburn. He  complains of having intermittent mid chest pain since he was admitted to the hospital. No SOB. No upper abdominal pain. He complains of lower abdominal pain for the past 2 days. His last dose of Xarelto was taking on 10/18 as verified by his daughter. He underwent a colonoscopy by Dr. Ardis Hughs in 2011 which showed a small benign ulceration to the cecum and one tubular adenomatous polyp was removed from the transverse colon. No further colonoscopies since that time. No family history of gastric or colon cancer.   CBC Latest Ref Rng & Units 07/06/2020 07/06/2020 07/04/2020  WBC 4.0 - 10.5 K/uL - 7.6 6.6  Hemoglobin 13.0 - 17.0 g/dL 8.1(L) 7.6(L) 9.2(L)  Hematocrit 39 - 52 % 26.5(L) 26.7(L) 30.9(L)  Platelets 150 - 400 K/uL - 216 219   CMP Latest Ref Rng & Units 07/06/2020 07/05/2020 07/05/2020  Glucose 70 - 99 mg/dL 78 171(H) 164(H)  BUN 8 - 23 mg/dL 38(H) 39(H) 35(H)  Creatinine 0.61 - 1.24 mg/dL 0.92 0.99 0.91  Sodium 135 - 145 mmol/L 138 138 138  Potassium 3.5 - 5.1 mmol/L 4.0 3.1(L) 2.9(L)  Chloride 98 - 111 mmol/L 111 107 105  CO2 22 - 32 mmol/L 16(L) 17(L) 18(L)  Calcium 8.9 -  10.3 mg/dL 7.7(L) 7.7(L) 7.5(L)  Total Protein 6.5 - 8.1 g/dL 6.9 - 6.7  Total Bilirubin 0.3 - 1.2 mg/dL 0.8 - 0.8  Alkaline Phos 38 - 126 U/L 501(H) - 491(H)  AST 15 - 41 U/L 71(H) - 73(H)  ALT 0 - 44 U/L 34 - 37    Abdominal xray 07/06/2020: Gaseous distention of colon increased from previous exam, especially sigmoid loop. No evidence of sigmoid volvulus was identified on the prior CT. Findings favor diffuse colonic ileus; consider repeat CT if patient's symptoms persist or worsen.  Abdominal/Pelvic CT with contrast  07/05/2020: 1. Moderately distended gas and fluid-filled colon and rectum likely to represent ileus. Infectious process would also be possible. No evidence of small bowel obstruction. 2. Moderate esophageal hiatal hernia. 3. Minimal bilateral pleural effusions. 4. Diffuse bone metastasis. 5. Stable appearance of low-attenuation lesion in the pancreatic head. 6. Aortic atherosclerosis. 7. Changes of chronic pancreatitis. Aortic Atherosclerosis   Colonoscopy by Dr. Ardis Hughs 02/07/2010: 59m sessile polyp mid transverse colon Small ulceration at the cecum  Biopsies:  1. COLON, BIOPSY, CECUM : FOCAL ACTIVE MUCOSAL COLITIS  2. COLON, POLYP(S), TRANSVERSE (COLD SNARE) : TUBULAR ADENOMA.NO HIGH GRADE  DYSPLASIA OR MALIGNANCY IDENTIFIED  ECHO 05/01/2020: 1. Left ventricular ejection fraction, by estimation, is 50 to 55%. The left ventricle has mildly decreased function. The left ventricle demonstrates regional wall motion abnormalities (see scoring diagram/findings for description). Left ventricular diastolic parameters are indeterminate. There is severe hypokinesis of the left ventricular, entire inferolateral wall. There is moderate hypokinesis of the left ventricular, basal-mid inferior wall. 2. Right ventricular systolic function is normal. The right ventricular size is normal. 3. Left atrial size was mildly dilated. 4. The mitral valve is normal in structure. Mild mitral valve regurgitation. 5. The aortic valve is tricuspid. Aortic valve regurgitation is not visualized. No aortic stenosis is present. 6. Agitated saline contrast bubble study was negative, with no evidence of any interatrial shunt.   Past Medical History:  Diagnosis Date  . Arthritis    "legs, back" (10/06/2015)  . Atrial fibrillation ([facility]    x1  . Atrial fibrillation ([facility]   . CAD (coronary artery disease)    nonobstructive  . Cancer ([facility]    skin cancer on ear (froze it off) and back (cut it off)  .  Dysrhythmia   . Esophageal reflux    hx of  . History of kidney stones   . Hx of heart artery stent   . Hypertrophy of prostate without urinary obstruction and other lower urinary tract symptoms (LUTS)   . Osteoarthrosis, unspecified whether generalized or localized, unspecified site   . Prostate cancer ([facility]   . Skin cancer   . Stroke ([facility] 03/2014   "had a series of mini strokes; maybe 4"; denies residual on 10/06/2015  . Thrombocytopenia ([facility]   . Type II diabetes mellitus ([facility]    type 2  . Unspecified essential hypertension     Past Surgical History:  Procedure Laterality Date  . BACK SURGERY     2001  . CARDIAC CATHETERIZATION  12/2007  . CARDIAC CATHETERIZATION N/A 09/22/2015   Procedure: Left Heart Cath and Coronary Angiography;  Surgeon: Peter M JMartinique MD;  [facility];  Service: Cardiovascular;  Laterality: N/A;  . CARDIAC CATHETERIZATION N/A 10/06/2015   Procedure: Coronary Stent Intervention;  Surgeon: Peter M JMartinique MD;  [facility];  Service: Cardiovascular;  Laterality: N/A;  . CARDIAC CATHETERIZATION N/A 02/29/2016  Procedure: Left Heart Cath and Coronary Angiography;  Surgeon: Jettie Booze, MD;  Location: [place] [facility];  Service: Cardiovascular;  Laterality: N/A;  . COLONOSCOPY W/ BIOPSIES AND POLYPECTOMY    . CORONARY STENT PLACEMENT  10/06/2015   [name]  with DES  . EAR CYST EXCISION N/A 04/06/2015   Procedure: EXCISION OF SCALP CYST;  Surgeon: Donnie Mesa, MD;  Location: [facility] [facility];  Service: General;  Laterality: N/A;  . ESOPHAGOGASTRODUODENOSCOPY (EGD) WITH ESOPHAGEAL DILATION  2001   with dilation  . LUMBAR DISC SURGERY  09/26/1999   "cleaned out arthritis and bone spurs"  . SHOULDER ARTHROSCOPY W/ ROTATOR CUFF REPAIR Left 2005  . THULIUM LASER TURP (TRANSURETHRAL RESECTION OF PROSTATE) N/A 01/21/2017   Procedure: Marcelino Duster LASER TURP (TRANSURETHRAL RESECTION OF PROSTATE) CAUTERIZATION OF BLADDER LESION;  Surgeon: Franchot Gallo, MD;  Location: [facility];  Service: Urology;  Laterality: N/A;    Prior to Admission medications   Medication Sig Start Date End Date Taking? Authorizing Provider  acetaminophen (TYLENOL) 325 MG tablet Take 650 mg by mouth every 12 (twelve) hours as needed for mild pain or moderate pain.   Yes [provider]  Amino Acids-Protein Hydrolys (FEEDING SUPPLEMENT, PRO-STAT SUGAR FREE 64,) LIQD Take 30 mLs by mouth in the morning and at bedtime.   Yes [provider]  Ascorbic Acid (VITAMIN C) 1000 MG tablet Take 1,000 mg by mouth daily.   Yes [provider]  atorvastatin (LIPITOR) 20 MG tablet Take 20 mg by mouth at bedtime.    Yes [provider]  B Complex Vitamins (VITAMIN B COMPLEX PO) Take 1 tablet by mouth daily.   Yes [provider]  calcium carbonate (CALCIUM 600) 1500 (600 Ca) MG TABS tablet Take 600 mg of elemental calcium by mouth daily.    Yes [provider]  Cholecalciferol (VITAMIN D3) 1.25 MG (50000 UT) CAPS Take 50,000 Units by mouth once a week. Saturday   Yes [provider]  cyanocobalamin 1000 MCG tablet Take 1,000 mg by mouth at bedtime.    Yes [provider]  docusate sodium (COLACE) 100 MG capsule Take 200 mg by mouth 2 (two) times daily as needed for mild constipation.   Yes [provider]  enzalutamide (XTANDI) 80 MG tablet Take 80 mg by mouth daily.   Yes [provider]  furosemide (LASIX) 40 MG tablet Take 40 mg by mouth daily as needed.   Yes [provider]  glimepiride (AMARYL) 2 MG tablet Take 1 tablet (2 mg total) by mouth every morning. Patient taking differently: Take 2 mg by mouth daily.  06/13/20 06/13/21 Yes Georgette Shell, MD  metoprolol tartrate (LOPRESSOR) 25 MG tablet Take 0.5 tablets (12.5 mg total) by mouth 2 (two) times daily. 06/24/20  Yes Antonieta Pert, MD  Multiple Vitamin (MULTIVITAMIN WITH MINERALS) TABS tablet Take 1 tablet by mouth daily.     Yes [provider]  nitroGLYCERIN (NITROSTAT) 0.4 MG SL tablet Place 1 tablet (0.4 mg total) under the tongue every 5 (five) minutes as needed for chest pain. Patient taking differently: Place 0.4 mg under the tongue every 5 (five) minutes as needed for chest pain. If no relief, call MD 05/15/18  Yes [name], MD  polyethylene glycol (MIRALAX / GLYCOLAX) 17 g packet Take 17 g by mouth daily.   Yes [provider]  rivaroxaban (XARELTO) 20 MG TABS tablet Take 1 tablet (20 mg total) by mouth daily with supper. 01/21/20  Yes Martinique, Peter M, MD  sodium chloride 1 g tablet Take 1 g by mouth 2 (two) times daily with a meal.   Yes [provider]  traMADol (ULTRAM) 50 MG tablet Take 1 tablet (50 mg total) by mouth 3 (three) times daily as needed. Patient taking differently: Take 50 mg by mouth 3 (three) times daily as needed for moderate pain.  06/13/20  Yes Georgette Shell, MD  BAYER MICROLET LANCETS lancets Use to test blood sugar once daily dx: E11.40 10/18/15   [name], MD  Blood Glucose Monitoring Suppl (CONTOUR NEXT EZ MONITOR) w/Device KIT USE TO TEST BLOOD SUGAR ONCE DAILY. Dx Code E11.40 06/13/16   Viviana Simpler I, MD  glucose blood (CONTOUR NEXT TEST) test strip Check blood sugar once daily and as directed.Dx Code E11.40 03/19/19   [name], MD    Current Facility-Administered Medications  Medication Dose Route Frequency Provider Last Rate Last Admin  . acetaminophen (TYLENOL) tablet 650 mg  650 mg Oral Q6H PRN Rise Patience, MD   650 mg at 07/05/20 1344   Or  . acetaminophen (TYLENOL) suppository 650 mg  650 mg Rectal Q6H PRN Rise Patience, MD      . Chlorhexidine Gluconate Cloth 2 % PADS 6 each  6 each Topical Daily Rise Patience, MD   6 each at 07/06/20 [id number]  . diltiazem (CARDIZEM) 125 mg in dextrose 5% 125 mL (1 mg/mL) infusion  5-15 mg/hr Intravenous Titrated Rise Patience, MD 5 mL/hr at 07/06/20 0943 5  mg/hr at 07/06/20 0943  . feeding supplement (ENSURE ENLIVE / ENSURE PLUS) liquid 237 mL  237 mL Oral BID BM Allie Bossier, MD      . heparin ADULT infusion 100 units/mL (25000 units/219m sodium chloride 0.45%)  1,050 Units/hr Intravenous Continuous BEudelia Bunch RPH 10.5 mL/hr at 07/06/20 1202 1,050 Units/hr at 07/06/20 1202  . hydrALAZINE (APRESOLINE) injection 5 mg  5 mg Intravenous Q6H PRN PElodia Florence, MD      . insulin aspart (novoLOG) injection 0-9 Units  0-9 Units Subcutaneous Q4H KRise Patience MD   1 Units at 07/05/20 1622  . lidocaine (LIDODERM) 5 % 1 patch  1 patch Transdermal Q24H PElodia Florence, MD      . morphine 2 MG/ML injection 2 mg  2 mg Intravenous Q4H PRN PElodia Florence, MD   2 mg at 07/06/20 1200  . sodium bicarbonate 150 mEq in dextrose 5 % 1,000 mL infusion   Intravenous Continuous PElodia Florence, MD      . sodium phosphate 20 mmol in dextrose 5 % 250 mL infusion  20 mmol Intravenous Once PElodia Florence, MD        Allergies as of 07/04/2020 - Review Complete 07/04/2020  Allergen Reaction Noted  . Soma [carisoprodol] Other (See Comments) 01/11/2017  . Doxazosin Other (See Comments) 03/28/2015    Family History  Problem Relation Age of Onset  . Stroke Father   . Peripheral vascular disease Father        amputation  . Heart failure Mother        CHF  . Coronary artery disease Mother   . Heart attack Mother 65      Multiple  . COPD Brother   . Heart disease Brother   . Cancer Brother        Bone  . Lung cancer Sister  Social History   Socioeconomic History  . Marital status: Married    Spouse name: Not on file  . Number of children: 4  . Years of education: Not on file  . Highest education level: Not on file  Occupational History  . Occupation: Radiographer, 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[employer]    Employer: RETIRED  Tobacco Use  . Smoking status: Former Smoker    Years: 3.00    Types: Cigarettes  . Smokeless  tobacco: Never Used  . Tobacco comment: " Quit smoking by age 77; was a someday smoker "  Vaping Use  . Vaping Use: Never used  Substance and Sexual Activity  . Alcohol use: No  . Drug use: No  . Sexual activity: Never  Other Topics Concern  . Not on file  Social History Narrative   No living will   Plans wife and then children to make health care decisions for him if unable   Would request at least attempts at resuscitation but no prolonged life support   Doesn't think he would want tube feeds if cognitively unaware   Social Determinants of Health   Financial Resource Strain:   . Difficulty of Paying Living Expenses: Not on file  Food Insecurity:   . Worried About Charity fundraiser in the Last Year: Not on file  . Ran Out of Food in the Last Year: Not on file  Transportation Needs:   . Lack of Transportation (Medical): Not on file  . Lack of Transportation (Non-Medical): Not on file  Physical Activity:   . Days of Exercise per Week: Not on file  . Minutes of Exercise per Session: Not on file  Stress:   . Feeling of Stress : Not on file  Social Connections:   . Frequency of Communication with Friends and Family: Not on file  . Frequency of Social Gatherings with Friends and Family: Not on file  . Attends Religious Services: Not on file  . Active Member of Clubs or Organizations: Not on file  . Attends Archivist Meetings: Not on file  . Marital Status: Not on file  Intimate Partner Violence:   . Fear of Current or Ex-Partner: Not on file  . Emotionally Abused: Not on file  . Physically Abused: Not on file  . Sexually Abused: Not on file    Review of Systems: See HPI, all other systems reviewed and are negative   Physical Exam: Vital signs in last 24 hours: Temp:  [96.2 F (35.7 C)-97.5 F (36.4 C)] 97.2 F (36.2 C) (10/20 1200) Pulse Rate:  [44-134] 81 (10/20 1100) Resp:  [13-18] 17 (10/20 1100) BP: (100-170)/(56-82) 159/75 (10/20 1100) SpO2:  [97  %-100 %] 99 % (10/20 1100) Weight:  [68.5 kg] 68.5 kg (10/20 0417) Last BM Date: 07/05/20 General: Alert 86 year old male fatigued appearing in no acute distress.  Frail.  Alert, conversational Head:  Normocephalic and atraumatic. Eyes:  No scleral icterus. Conjunctiva pink. Ears:  Normal auditory acuity. Nose:  No deformity, discharge or lesions. Mouth: Upper and lower dentures.  Dry mouth with brown coating on tongue.  No ulcers or lesions.  Neck:  Supple. No lymphadenopathy or thyromegaly.  Lungs: Crackles in the bases bilaterally arise clear. Heart: Irregular rhythm, no murmurs. Abdomen: Distended upper abdomen but not tense, lower abdominal tenderness without rebound or guarding.  High-pitched bowel sounds, tympanitic Rectal: Deferred. Musculoskeletal:  Symmetrical without gross deformities.  Pulses:  Normal pulses noted. Extremities:  Without clubbing or edema. Neurologic:  Alert and  oriented x4.  His speech is clear.  Mild generalized weakness to his upper and lower extremities. Skin:  Intact without significant lesions or rashes. Psych:  Alert and cooperative. Normal mood and affect.  Intake/Output from previous day: 10/19 0701 - 10/20 0700 In: 2295.4 [I.V.:1782; IV Piggyback:513.3] Out: 500 [Urine:500] Intake/Output this shift: No intake/output data recorded.  Lab Results: Recent Labs    07/04/20 2237 07/06/20 0314 07/06/20 1127  WBC 6.6 7.6  --   HGB 9.2* 7.6* 8.1*  HCT 30.9* 26.7* 26.5*  PLT 219 216  --    BMET Recent Labs    07/05/20 0402 07/05/20 0945 07/06/20 0314  NA 138 138 138  K 2.9* 3.1* 4.0  CL 105 107 111  CO2 18* 17* 16*  GLUCOSE 164* 171* 78  BUN 35* 39* 38*  CREATININE 0.91 0.99 0.92  CALCIUM 7.5* 7.7* 7.7*   LFT Recent Labs    07/05/20 0402 07/05/20 0402 07/06/20 0314  PROT 6.7   < > 6.9  ALBUMIN 1.8*   < > 2.1*  AST 73*   < > 71*  ALT 37   < > 34  ALKPHOS 491*   < > 501*  BILITOT 0.8   < > 0.8  BILIDIR 0.2  --   --   IBILI  0.6  --   --    < > = values in this interval not displayed.   PT/INR No results for input(s): LABPROT, INR in the last 72 hours. Hepatitis Panel No results for input(s): HEPBSAG, HCVAB, HEPAIGM, HEPBIGM in the last 72 hours.    Studies/Results: DG Abd 1 View  Result Date: 07/06/2020 CLINICAL DATA:  Abdominal pain EXAM: ABDOMEN - 1 VIEW COMPARISON:  CT abdomen and pelvis 07/05/2020 FINDINGS: Diffuse gaseous distention of colon increased from previous exam. This includes extensive gaseous distension of the sigmoid loop. No definite gas in rectum or bowel wall thickening identified. Increased stool in RIGHT colon. Small bowel decompressed. Osseous demineralization with degenerative disc/facet disease changes and dextroconvex scoliosis lumbar spine. No urinary tract calcification definitely visualized. Scattered pelvic phleboliths. IMPRESSION: Gaseous distention of colon increased from previous exam, especially sigmoid loop. No evidence of sigmoid volvulus was identified on the prior CT. Findings favor diffuse colonic ileus; consider repeat CT if patient's symptoms persist or worsen. Electronically Signed   By: Lavonia Dana M.D.   On: 07/06/2020 10:36   CT ABDOMEN PELVIS W CONTRAST  Result Date: 07/05/2020 CLINICAL DATA:  Suspected bowel obstruction. No bowel movements in 3 days. EXAM: CT ABDOMEN AND PELVIS WITH CONTRAST TECHNIQUE: Multidetector CT imaging of the abdomen and pelvis was performed using the standard protocol following bolus administration of intravenous contrast. CONTRAST:  16m OMNIPAQUE IOHEXOL 300 MG/ML  SOLN COMPARISON:  05/01/2020 FINDINGS: Lower chest: Mild interstitial pattern to the lung bases likely representing fibrosis. Minimal bilateral pleural effusions. Moderate esophageal hiatal hernia. Coronary artery calcifications. Hepatobiliary: No focal liver abnormality is seen. No gallstones, gallbladder wall thickening, or biliary dilatation. Pancreas: Pancreas is atrophic.  Low-attenuation lesion in the pancreatic head appears stable. Calcification in the pancreatic head likely indicates chronic pancreatitis. Spleen: Normal in size without focal abnormality. Adrenals/Urinary Tract: Adrenal glands are unremarkable. Kidneys are normal, without renal calculi, focal lesion, or hydronephrosis. Bladder is unremarkable. Stomach/Bowel: Stomach and small bowel are decompressed. Moderately distended gas and fluid-filled colon and rectum. Changes likely to represent ileus. Infectious process would also be possible. No colonic wall thickening to suggest colitis. Appendix is not identified.  Vascular/Lymphatic: Aortic atherosclerosis. No enlarged abdominal or pelvic lymph nodes. Reproductive: Prostate is unremarkable. Other: No free air or free fluid in the abdomen. Abdominal wall musculature appears intact. Musculoskeletal: Prominent degenerative changes throughout the spine. Multiple focal and confluent areas of sclerosis in the visualized skeleton consistent with diffuse bone metastasis. IMPRESSION: 1. Moderately distended gas and fluid-filled colon and rectum likely to represent ileus. Infectious process would also be possible. No evidence of small bowel obstruction. 2. Moderate esophageal hiatal hernia. 3. Minimal bilateral pleural effusions. 4. Diffuse bone metastasis. 5. Stable appearance of low-attenuation lesion in the pancreatic head. 6. Aortic atherosclerosis. 7. Changes of chronic pancreatitis. Aortic Atherosclerosis (ICD10-I70.0). Electronically Signed   By: Lucienne Capers M.D.   On: 07/05/2020 01:22   DG Chest Port 1 View  Result Date: 07/04/2020 CLINICAL DATA:  Shortness of breath EXAM: PORTABLE CHEST 1 VIEW COMPARISON:  06/18/2020 FINDINGS: The heart size and mediastinal contours are within normal limits. Both lungs are clear. The visualized skeletal structures are unremarkable. IMPRESSION: No active disease. Electronically Signed   By: Inez Catalina M.D.   On: 07/04/2020 23:15     IMPRESSION/PLAN:  86.  86 year old male admitted to the hospital with generalized abdominal pain with abdominal distention. CTAP consistent with a colonic ileus.  Hypokalemia most likely attributing to his ileus.  He received KCl IV his potassium today is normal at 4.0. NPO Consider a rectal tube for decompression  IV fluids per the hospitalist  Check electrolytes daily Turn the patient Q 2 hours   2.  Atrial fibrillation with RVR.  He received a Cardizem IV bolus followed by an infusion. Xarelto on hold. He is on a heparin infusion.   3. CAD s/p stent 2017.  He reports having intermittent mid chest pain since his hospital admission.  -Recommend repeat EKG and cardiology consult  4. Anemia. Hg 7.6 -> 8.1. Iron 60. TIBC 150. Ferritin 3713. B12 2429  5. Elevated alk phos (most likely due to bone mets). LFTs mildly elevated. Alk phos 501. AST 71. ALT 34. Normal T. Bili 0.8. CTAP showed a normal liver without evidence of biliary dilatation. Repeat hepatic panel in am  6. Hiatal hernia  7. Atrophic pancreas with calcification to the head of the pancreas, most likely chronic pancreatitis   8. Prostate cancer with bone metastasis   9. History of a tubular adenomatous colon polyp per colonoscopy 2011  10 CVA 04/2020  Further recommendations per Dr. Myrene Galas Dorathy Daft  07/06/2020, 2:17 PM   I have reviewed the entire case in detail with the above APP and discussed the plan in detail.  Therefore, I agree with the diagnoses recorded above. In addition,  I have personally interviewed and examined the patient and have personally reviewed any abdominal/pelvic CT scan images and today's KUB   my additional thoughts are as follows: ( I made some minor changes to the abdominal exam originally noted above, he is distended and tympanic with a few high-pitched bowel sounds)  Patient's daughter was present for the entire visit.  Dr. Florene Glen was there to see patient because of  some earlier complaints of chest pain.  We also discussed the case.  Acute colonic pseudoobstruction/ileus due to multiple acute on chronic illnesses.  He has rapid A. fib, acid-base and electrolyte derangement, all of which is actively being treated by the internal medicine service.  He is on rate control and unfractionated heparin for A. fib.  Markedly elevated troponin on admission indicating cardiac  stress in the setting of this illness.  He does not have a surgical abdomen, but the worsening colonic distention on today's KUB certainly means he is at risk for ischemic compromise of this distended bowel.  He is too high risk for any endoscopic procedures.  Even if sigmoidoscopic decompression was done, the distention would almost immediately recur due to the underlying illness.  I have ordered placement of a rectal tube to low intermittent suction in hopes of decreasing the colonic distention.  There is some risk of bleeding from the low intermittent suction because the patient cannot stop IV heparin per the internal medicine service, however the heparin would have to be stopped if rectal bleeding occurs.  When the patient is medically improved, increased activity with the help of physical therapy will help improve his bowel motility.   Nelida Meuse III Office:[phone]

## 2020-07-06 NOTE — Progress Notes (Signed)
Hypoglycemic Event  CBG 65  Treatment: D50 25 mL (12.5 gm)  Symptoms: None  Follow-up CBG: Time 04:27 CBG Result:132  Possible Reasons for Event: Inadequate meal intake  Comments/MD notified Hollace Hayward NP    Tad Moore

## 2020-07-07 ENCOUNTER — Inpatient Hospital Stay (HOSPITAL_COMMUNITY): Payer: Medicare Other

## 2020-07-07 DIAGNOSIS — I340 Nonrheumatic mitral (valve) insufficiency: Secondary | ICD-10-CM | POA: Diagnosis not present

## 2020-07-07 DIAGNOSIS — R140 Abdominal distension (gaseous): Secondary | ICD-10-CM | POA: Diagnosis not present

## 2020-07-07 DIAGNOSIS — R935 Abnormal findings on diagnostic imaging of other abdominal regions, including retroperitoneum: Secondary | ICD-10-CM

## 2020-07-07 DIAGNOSIS — K567 Ileus, unspecified: Secondary | ICD-10-CM | POA: Diagnosis not present

## 2020-07-07 LAB — URINALYSIS, ROUTINE W REFLEX MICROSCOPIC
Bilirubin Urine: NEGATIVE
Glucose, UA: 50 mg/dL — AB
Ketones, ur: 20 mg/dL — AB
Nitrite: NEGATIVE
Protein, ur: 100 mg/dL — AB
RBC / HPF: >50 RBC/hpf — ABNORMAL HIGH (ref 0–5)
Specific Gravity, Urine: 1.017 (ref 1.005–1.030)
pH: 5 (ref 5.0–8.0)

## 2020-07-07 LAB — CBC WITH DIFFERENTIAL/PLATELET
Abs Immature Granulocytes: 0.84 10*3/uL — ABNORMAL HIGH (ref 0.00–0.07)
Basophils Absolute: 0 10*3/uL (ref 0.0–0.1)
Basophils Relative: 1 %
Eosinophils Absolute: 0 10*3/uL (ref 0.0–0.5)
Eosinophils Relative: 1 %
HCT: 24.5 % — ABNORMAL LOW (ref 39.0–52.0)
Hemoglobin: 7.4 g/dL — ABNORMAL LOW (ref 13.0–17.0)
Immature Granulocytes: 10 %
Lymphocytes Relative: 11 %
Lymphs Abs: 0.9 10*3/uL (ref 0.7–4.0)
MCH: 23.6 pg — ABNORMAL LOW (ref 26.0–34.0)
MCHC: 30.2 g/dL (ref 30.0–36.0)
MCV: 78.3 fL — ABNORMAL LOW (ref 80.0–100.0)
Monocytes Absolute: 0.7 10*3/uL (ref 0.1–1.0)
Monocytes Relative: 8 %
Neutro Abs: 5.8 10*3/uL (ref 1.7–7.7)
Neutrophils Relative %: 69 %
Platelets: 194 10*3/uL (ref 150–400)
RBC: 3.13 MIL/uL — ABNORMAL LOW (ref 4.22–5.81)
RDW: 21.6 % — ABNORMAL HIGH (ref 11.5–15.5)
WBC: 8.3 10*3/uL (ref 4.0–10.5)
nRBC: 1.2 % — ABNORMAL HIGH (ref 0.0–0.2)

## 2020-07-07 LAB — BASIC METABOLIC PANEL
Anion gap: 11 (ref 5–15)
BUN: 21 mg/dL (ref 8–23)
CO2: 23 mmol/L (ref 22–32)
Calcium: 6.9 mg/dL — ABNORMAL LOW (ref 8.9–10.3)
Chloride: 108 mmol/L (ref 98–111)
Creatinine, Ser: 0.75 mg/dL (ref 0.61–1.24)
GFR, Estimated: >60 mL/min (ref >60)
Glucose, Bld: 204 mg/dL — ABNORMAL HIGH (ref 70–99)
Potassium: 3.3 mmol/L — ABNORMAL LOW (ref 3.5–5.1)
Sodium: 142 mmol/L (ref 135–145)

## 2020-07-07 LAB — COMPREHENSIVE METABOLIC PANEL
ALT: 25 U/L (ref 0–44)
AST: 107 U/L — ABNORMAL HIGH (ref 15–41)
Albumin: 2 g/dL — ABNORMAL LOW (ref 3.5–5.0)
Alkaline Phosphatase: 750 U/L — ABNORMAL HIGH (ref 38–126)
Anion gap: 12 (ref 5–15)
BUN: 23 mg/dL (ref 8–23)
CO2: 20 mmol/L — ABNORMAL LOW (ref 22–32)
Calcium: 7.3 mg/dL — ABNORMAL LOW (ref 8.9–10.3)
Chloride: 108 mmol/L (ref 98–111)
Creatinine, Ser: 0.68 mg/dL (ref 0.61–1.24)
GFR, Estimated: >60 mL/min (ref >60)
Glucose, Bld: 142 mg/dL — ABNORMAL HIGH (ref 70–99)
Potassium: 3.3 mmol/L — ABNORMAL LOW (ref 3.5–5.1)
Sodium: 140 mmol/L (ref 135–145)
Total Bilirubin: 0.7 mg/dL (ref 0.3–1.2)
Total Protein: 6.5 g/dL (ref 6.5–8.1)

## 2020-07-07 LAB — PHOSPHORUS: Phosphorus: 3 mg/dL (ref 2.5–4.6)

## 2020-07-07 LAB — GLUCOSE, CAPILLARY
Glucose-Capillary: 127 mg/dL — ABNORMAL HIGH (ref 70–99)
Glucose-Capillary: 132 mg/dL — ABNORMAL HIGH (ref 70–99)
Glucose-Capillary: 155 mg/dL — ABNORMAL HIGH (ref 70–99)
Glucose-Capillary: 171 mg/dL — ABNORMAL HIGH (ref 70–99)
Glucose-Capillary: 186 mg/dL — ABNORMAL HIGH (ref 70–99)
Glucose-Capillary: 201 mg/dL — ABNORMAL HIGH (ref 70–99)

## 2020-07-07 LAB — HEPARIN LEVEL (UNFRACTIONATED): Heparin Unfractionated: 0.9 IU/mL — ABNORMAL HIGH (ref 0.30–0.70)

## 2020-07-07 LAB — HEMOGLOBIN AND HEMATOCRIT, BLOOD
HCT: 23.1 % — ABNORMAL LOW (ref 39.0–52.0)
Hemoglobin: 6.9 g/dL — CL (ref 13.0–17.0)

## 2020-07-07 LAB — GAMMA GT: GGT: 26 U/L (ref 7–50)

## 2020-07-07 LAB — MAGNESIUM
Magnesium: 2 mg/dL (ref 1.7–2.4)
Magnesium: 2.1 mg/dL (ref 1.7–2.4)

## 2020-07-07 LAB — APTT: aPTT: 92 seconds — ABNORMAL HIGH (ref 24–36)

## 2020-07-07 LAB — PREPARE RBC (CROSSMATCH)

## 2020-07-07 MED ORDER — KCL-LACTATED RINGERS-D5W 20 MEQ/L IV SOLN
INTRAVENOUS | Status: AC
  Filled 2020-07-07 (×2): qty 1000

## 2020-07-07 MED ORDER — SODIUM CHLORIDE 0.9% IV SOLUTION: Freq: Once | INTRAVENOUS | Status: AC

## 2020-07-07 MED ORDER — POTASSIUM CHLORIDE 10 MEQ/100ML IV SOLN
10.0000 meq | INTRAVENOUS | Status: AC
  Administered 2020-07-07 (×4): 10 meq via INTRAVENOUS
  Filled 2020-07-07 (×4): qty 100

## 2020-07-07 MED ORDER — PANTOPRAZOLE SODIUM 40 MG IV SOLR
40.0000 mg | INTRAVENOUS | Status: DC
  Administered 2020-07-07 – 2020-07-13 (×7): 40 mg via INTRAVENOUS
  Filled 2020-07-07 (×7): qty 40

## 2020-07-07 MED ORDER — BENZONATATE 100 MG PO CAPS
100.0000 mg | ORAL_CAPSULE | Freq: Three times a day (TID) | ORAL | Status: DC | PRN
  Administered 2020-07-08 – 2020-07-11 (×3): 100 mg via ORAL
  Filled 2020-07-07 (×4): qty 1

## 2020-07-07 MED ORDER — BISACODYL 10 MG RE SUPP
10.0000 mg | Freq: Two times a day (BID) | RECTAL | Status: AC
  Administered 2020-07-07 – 2020-07-09 (×5): 10 mg via RECTAL
  Filled 2020-07-07 (×5): qty 1

## 2020-07-07 NOTE — Progress Notes (Signed)
[facility] Progress Note  CC:  Ileus   Subjective: He reported sleeping well last night. No N/V. No abdominal pain this am. He remains NPO. Rectal tube to gravity.   Objective:  Vital signs in last 24 hours: Temp:  [97.2 F (36.2 C)-98.3 F (36.8 C)] 97.9 F (36.6 C) (10/21 0401) Pulse Rate:  [80-92] 92 (10/21 0401) Resp:  [15-20] 20 (10/20 2016) BP: (124-173)/(69-79) 149/79 (10/21 0401) SpO2:  [98 %-100 %] 98 % (10/21 0401) Last BM Date: 07/06/20   General:  86 year old male alert,  ill appearing in NAD.  Heart: Irregular rhythm, no murmur.  Pulm:  Breath sounds clear, diminished in the bases.  Abdomen: Moderate upper abdominal distension, appears more distended today when compared to yesterday's exam. Few gushing bowel sounds which dissipated. Mild central and lower abdominal tenderness without rebound or guarding. 100F rectal tube to gravity, small amount of brown stool soiled on mattress pad.  GU: Foley catheter with bloody urine with sediment.  Extremities:  Without edema. Neurologic:  Alert and  oriented x4. LUE weakness.  Psych:  Alert and cooperative. Normal mood and affect.  Intake/Output from previous day: 10/20 0701 - 10/21 0700 In: 1032.9 [I.V.:898.9; IV Piggyback:134] Out: 1000 [Urine:1000] Intake/Output this shift: No intake/output data recorded.  Lab Results: Recent Labs    07/04/20 2237 07/04/20 2237 07/06/20 0314 07/06/20 1127 07/07/20 0406  WBC 6.6  --  7.6  --  8.3  HGB 9.2*   < > 7.6* 8.1* 7.4*  HCT 30.9*   < > 26.7* 26.5* 24.5*  PLT 219  --  216  --  194   < > = values in this interval not displayed.   BMET Recent Labs    07/05/20 0945 07/06/20 0314 07/07/20 0406  NA 138 138 140  K 3.1* 4.0 3.3*  CL 107 111 108  CO2 17* 16* 20*  GLUCOSE 171* 78 142*  BUN 39* 38* 23  CREATININE 0.99 0.92 0.68  CALCIUM 7.7* 7.7* 7.3*   LFT Recent Labs    07/05/20 0402 07/06/20 0314 07/07/20 0406  PROT 6.7   < > 6.5  ALBUMIN  1.8*   < > 2.0*  AST 73*   < > 107*  ALT 37   < > 25  ALKPHOS 491*   < > 750*  BILITOT 0.8   < > 0.7  BILIDIR 0.2  --   --   IBILI 0.6  --   --    < > = values in this interval not displayed.   PT/INR No results for input(s): LABPROT, INR in the last 72 hours. Hepatitis Panel No results for input(s): HEPBSAG, HCVAB, HEPAIGM, HEPBIGM in the last 72 hours.  DG Abd 1 View  Result Date: 07/06/2020 CLINICAL DATA:  Abdominal pain EXAM: ABDOMEN - 1 VIEW COMPARISON:  CT abdomen and pelvis 07/05/2020 FINDINGS: Diffuse gaseous distention of colon increased from previous exam. This includes extensive gaseous distension of the sigmoid loop. No definite gas in rectum or bowel wall thickening identified. Increased stool in RIGHT colon. Small bowel decompressed. Osseous demineralization with degenerative disc/facet disease changes and dextroconvex scoliosis lumbar spine. No urinary tract calcification definitely visualized. Scattered pelvic phleboliths. IMPRESSION: Gaseous distention of colon increased from previous exam, especially sigmoid loop. No evidence of sigmoid volvulus was identified on the prior CT. Findings favor diffuse colonic ileus; consider repeat CT if patient's symptoms persist or worsen. Electronically Signed   By: Crist Infante.D.  On: 07/06/2020 10:36   DG CHEST PORT 1 VIEW  Result Date: 07/06/2020 CLINICAL DATA:  Chest pain. EXAM: PORTABLE CHEST 1 VIEW COMPARISON:  July 04, 2020 FINDINGS: Low lung volumes are noted. There is no evidence of acute infiltrate, pleural effusion or pneumothorax. The heart size and mediastinal contours are within normal limits. The visualized skeletal structures are unremarkable. IMPRESSION: No active disease. Electronically Signed   By: Virgina Norfolk M.D.   On: 07/06/2020 20:10    Assessment / Plan:  76.  86 year old male with prostate cancer with bone metastasis admitted to the hospital with generalized abdominal pain with abdominal distention.  CTAP consistent with a colonic ileus.  Hypokalemia most likely attributing to his ileus.  Today K+ 3.3. Abdominal Xray pending.  Await am abdominal xray result. If colonic ileus worsening, consider repeat CTAP. NPO KCL IV replacement per the hospitalist   Check electrolytes daily Turn the patient Q 2 hours  Place NGT. Check xray to confirm placement. Place to LIS once NGT placement confirmed.  Continue rectal tube to gravity for now Dulcolax 4m suppository bid  2.  Atrial fibrillation with RVR.  He received a Cardizem IV bolus followed by an infusion. Xarelto on hold. He is on a heparin infusion.   3. CAD s/p stent 2017.  He reported having chest pain 10/20. EKG and chest xray were negative.   4. Anemia. Hg 7.6 -> 8.1 -> 7.4.  (base line Hg 9.3 - 10 on 05/2020). Iron 60. TIBC 150. Ferritin 3713. B12 2429. Foley catheter with hematuria. No obvious GI bleeding.  -Defer hematuria on heparin management to the hospitalist  -Recommend PRBC transfusion for Hg < 7.5  5. Elevated alk phos (most likely due to bone mets). LFTs elevated. Alk phos 501 ->750.  AST 71 -> 107. ALT 34 -> 25. Normal T. Bili 0.7. GGT 26. CTAP showed a normal liver without evidence of biliary dilatation. -Repeat hepatic panel in am -Consider MRI/MRCP if chest pain recurs, if alk phos, AST continue to increase.   6. Hiatal hernia  7. Atrophic pancreas with calcification to the head of the pancreas, most likely chronic pancreatitis   8. Prostate cancer with bone metastasis   9. History of a tubular adenomatous colon polyp per colonoscopy 2011  10 CVA 04/2020      Principal Problem:   Ileus ([place] Active Problems:   Essential hypertension   Uncontrolled type 2 diabetes mellitus with diabetic polyneuropathy, without long-term current use of insulin ([place]   CKD stage 3 due to type 2 diabetes mellitus ([place] [place]   History of CVA (cerebrovascular accident)   CAD S/P percutaneous coronary angioplasty   Prostate  cancer ([facility]   Elevated troponin   Pressure injury of skin   Hypokalemia   Atrial fibrillation with RVR ([facility]     LOS: 2 days   CNoralyn Pick 07/07/2020, 8:54 AM

## 2020-07-07 NOTE — Progress Notes (Addendum)
PROGRESS NOTE    Adrian Singh  BJS:[id number] DOB: 10-09-1933 DOA: 07/04/2020 PCP: [name], MD  Chief Complaint  Patient presents with  . Abdominal Pain   Brief Narrative:  Adrian Singh Adrian Singh 86 y.o.WM PMHx CAD status post stenting, Adrian Singh. 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 Denies any vomiting fever or chills. Abdominal discomfort is generalized. Was able to take his medications.  ED Course:In the ER patient was in [name]. fib with RVR. CT abdomen pelvis shows features concerning for possible ileus. Labs are significant for high sensitive troponin of 8712. Hemoglobin is 9.2 potassium was low at 2.6 Covid test was negative. Patient was started on potassium replacement and also since patient is in [name]. fib with RVR Cardizem bolus was initially given then started on Cardizem infusion. Admitted for ileus and [name]. fib with RVR. Denies any chest pain. But did complain of some shortness of breath. Chest x-ray did not show any infiltrates.  Assessment & Plan:   Principal Problem:   Ileus ([name]) Active Problems:   Essential hypertension   Uncontrolled type 2 diabetes mellitus with diabetic polyneuropathy, without long-term current use of insulin (HCC)   CKD stage 3 due to type 2 diabetes mellitus ([place])   History of CVA (cerebrovascular accident)   CAD S/P percutaneous coronary angioplasty   Prostate cancer (HCC)   Elevated troponin   Pressure  injury of skin   Hypokalemia   Atrial fibrillation with RVR (HCC)  Ileus - CT scan consistent with ileus -- KUB 10/21 with stable diffuse gaseous distension -- continued abdominal distension today, though improved -- will consider repeat CT scan if patient with worsening pain/symptoms -- replace mag/K as needed to keep >2/4 respectively -- will discuss with GI, appreciate recommendations  Hypokalemia  Hypophosphatemia - replace and follow   Elevated LFT's  Elevated Alk Phos:  - normal bilirubin, GGT wnl -> suspect alk phos 2/2 bone mets - Appreciate Gi assistance, will continue to follow, may need additional imaging  Chronic pain syndrome -Morphine PRN --at home is on tramadol per med rec  Chronic systolic CHF -Strict in and out -Daily weight  --caution with IVF, takes 40 mg lasix daily as need at home  Elevated troponin  Chest Pain -- c/o intermittent chest discomfort yesterday, unclear etiology, he c/o diffuse pain at times.  Troponin was elevated, but flat, not c/w ACS and EKG was without changes concerning for ACS. -- echo is pending, eval for WMA, can discuss with cardiology prn -- will continue to monitor, asymptomatic today  Fib with RVR -Cardizem drip - continue while NPO -Currently NSR -Heparin drip per pharmacy some hematuria noted today, will hold for now  Hematuria - while on heparin gtt, follow UA - hold heparin for now  Chronic anemia -labs suggestive of AOCD - hypoprolieferative retics - repeat pm H/H  Cough Follow chest x ray  NAGMA - improved, continue to monitor  DM type II controlled with complication; DM gastroparesis  Hypoglycemia -10/6 hemoglobin A1c= 6.5 -Sensitive SSI -- follow with D5 containing fluids  Metastatic  prostate cancer  Hx CVA with residual RIGHT sided weakness and dysarthria  DVT prophylaxis: heparin  Code Status: full  Family Communication: none at bedside Disposition:   Status is: Inpatient  Remains  inpatient appropriate because:Inpatient level of care appropriate due to severity of illness   Dispo: The patient is from: Home              Anticipated d/c is to: pending              Anticipated d/c date is: > 3 days              Patient currently is not medically stable to d/c.    Consultants:   GI  Procedures:  none  Antimicrobials:  Anti-infectives (From admission, onward)   None     Subjective: Pain is improved C/o cough today  Objective: Vitals:   07/07/20 0037 07/07/20 0401 07/07/20 1136 07/07/20 1759  BP: (!) 141/77 (!) 149/79 137/68 123/71  Pulse: 92 92 89 95  Resp:   15 17  Temp: 97.9 F (36.6 C) 97.9 F (36.6 C) 98.1 F (36.7 C) 98 F (36.7 C)  TempSrc: Axillary Oral    SpO2: 98% 98% 97% 99%  Weight:      Height:        Intake/Output Summary (Last 24 hours) at 07/07/2020 1804 Last data filed at 07/07/2020 0407 Gross per 24 hour  Intake 1032.94 ml  Output 1000 ml  Net 32.94 ml   Filed Weights   07/04/20 2240 07/05/20 0600 07/06/20 0417  Weight: 65 kg 67 kg 68.5 kg    Examination:  General: No acute distress. Cardiovascular: Heart sounds show Carlicia Leavens regular rate, and rhythm.  Lungs: Clear to auscultation bilaterally Abdomen: distended, but improved since yesterday Neurological: Alert and oriented 3. Moves all extremities 4. Cranial nerves II through XII grossly intact. Skin: Warm and dry. No rashes or lesions. Extremities: No clubbing or cyanosis. No edema.  Data Reviewed: I have personally reviewed following labs and imaging studies  CBC: Recent Labs  Lab 07/04/20 2237 07/06/20 0314 07/06/20 1127 07/07/20 0406  WBC 6.6 7.6  --  8.3  NEUTROABS  --  5.0  --  5.8  HGB 9.2* 7.6* 8.1* 7.4*  HCT 30.9* 26.7* 26.5* 24.5*  MCV 79.6* 80.9  --  78.3*  PLT 219 216  --  086    Basic Metabolic Panel: Recent Labs  Lab 07/05/20 0402 07/05/20 0945 07/06/20 0314 07/07/20 0406 07/07/20 1358  NA 138 138 138 140 142  K 2.9* 3.1* 4.0 3.3*  3.3*  CL 105 107 111 108 108  CO2 18* 17* 16* 20* 23  GLUCOSE 164* 171* 78 142* 204*  BUN 35* 39* 38* 23 21  CREATININE 0.91 0.99 0.92 0.68 0.75  CALCIUM 7.5* 7.7* 7.7* 7.3* 6.9*  MG 2.2 2.3 2.5* 2.0 2.1  PHOS  --  2.6 1.7* 3.0  --     GFR: Estimated Creatinine Clearance: 67.8 mL/min (by C-G formula based on SCr of 0.75 mg/dL).  Liver Function Tests: Recent Labs  Lab 07/04/20 2237 07/05/20 0402 07/06/20 0314 07/07/20 0406  AST 58* 73* 71* 107*  ALT 30 37 34 25  ALKPHOS 564* 491* 501* 750*  BILITOT 0.8 0.8 0.8 0.7  PROT 7.2 6.7 6.9 6.5  ALBUMIN 2.0* 1.8* 2.1* 2.0*    CBG: Recent Labs  Lab 07/07/20 0033 07/07/20 0358 07/07/20 0749 07/07/20 1130 07/07/20 1638  GLUCAP 127* 132* 155* 201* 171*  Recent Results (from the past 240 hour(s))  Respiratory Panel by RT PCR (Flu Annitta Fifield&B, Covid) - Nasopharyngeal Swab     Status: None   Collection Time: 07/04/20 10:56 PM   Specimen: Nasopharyngeal Swab  Result Value Ref Range Status   SARS Coronavirus 2 by RT PCR NEGATIVE NEGATIVE Final    Comment: (NOTE) SARS-CoV-2 target nucleic acids are NOT DETECTED.  The SARS-CoV-2 RNA is generally detectable in upper respiratoy specimens during the acute phase of infection. The lowest concentration of SARS-CoV-2 viral copies this assay can detect is 131 copies/mL. Marlyne Totaro negative result does not preclude SARS-Cov-2 infection and should not be used as the sole basis for treatment or other patient management decisions. Hopelynn Gartland negative result may occur with  improper specimen collection/handling, submission of specimen other than nasopharyngeal swab, presence of viral mutation(s) within the areas targeted by this assay, and inadequate number of viral copies (<131 copies/mL). Janice Seales negative result must be combined with clinical observations, patient history, and epidemiological information. The expected result is Negative.  Fact Sheet for Patients:  PinkCheek.be  Fact  Sheet for Healthcare Providers:  GravelBags.it  This test is no t yet approved or cleared by the [place] FDA and  has been authorized for detection and/or diagnosis of SARS-CoV-2 by FDA under an Emergency Use Authorization (EUA). This EUA will remain  in effect (meaning this test can be used) for the duration of the COVID-19 declaration under Section 564(b)(1) of the Act, 21 U.S.C. section 360bbb-3(b)(1), unless the authorization is terminated or revoked sooner.     Influenza Ayeden Gladman by PCR NEGATIVE NEGATIVE Final   Influenza B by PCR NEGATIVE NEGATIVE Final    Comment: (NOTE) The Xpert Xpress SARS-CoV-2/FLU/RSV assay is intended as an aid in  the diagnosis of influenza from Nasopharyngeal swab specimens and  should not be used as Carlene Bickley sole basis for treatment. Nasal washings and  aspirates are unacceptable for Xpert Xpress SARS-CoV-2/FLU/RSV  testing.  Fact Sheet for Patients: PinkCheek.be  Fact Sheet for Healthcare Providers: GravelBags.it  This test is not yet approved or cleared by the [place] FDA and  has been authorized for detection and/or diagnosis of SARS-CoV-2 by  FDA under an Emergency Use Authorization (EUA). This EUA will remain  in effect (meaning this test can be used) for the duration of the  Covid-19 declaration under Section 564(b)(1) of the Act, 21  U.S.C. section 360bbb-3(b)(1), unless the authorization is  terminated or revoked. Performed at [facility], [place] [address], [place], [place] [address]   MRSA PCR Screening     Status: None   Collection Time: 07/05/20  6:08 AM   Specimen: Nasopharyngeal  Result Value Ref Range Status   MRSA by PCR NEGATIVE NEGATIVE Final    Comment:        The GeneXpert MRSA Assay (FDA approved for NASAL specimens only), is one component of Annely Sliva comprehensive MRSA colonization surveillance program. It is not intended  to diagnose MRSA infection nor to guide or monitor treatment for MRSA infections. Performed at [facility], [place] [address], [place], [place] [address]          Radiology Studies: DG Abd 1 View  Result Date: 07/06/2020 CLINICAL DATA:  Abdominal pain EXAM: ABDOMEN - 1 VIEW COMPARISON:  CT abdomen and pelvis 07/05/2020 FINDINGS: Diffuse gaseous distention of colon increased from previous exam. This includes extensive gaseous distension of the sigmoid loop. No definite gas in rectum or bowel wall thickening identified. Increased stool  in RIGHT colon. Small bowel decompressed. Osseous demineralization with degenerative disc/facet disease changes and dextroconvex scoliosis lumbar spine. No urinary tract calcification definitely visualized. Scattered pelvic phleboliths. IMPRESSION: Gaseous distention of colon increased from previous exam, especially sigmoid loop. No evidence of sigmoid volvulus was identified on the prior CT. Findings favor diffuse colonic ileus; consider repeat CT if patient's symptoms persist or worsen. Electronically Signed   By: Lavonia Dana M.D.   On: 07/06/2020 10:36   DG CHEST PORT 1 VIEW  Result Date: 07/06/2020 CLINICAL DATA:  Chest pain. EXAM: PORTABLE CHEST 1 VIEW COMPARISON:  July 04, 2020 FINDINGS: Low lung volumes are noted. There is no evidence of acute infiltrate, pleural effusion or pneumothorax. The heart size and mediastinal contours are within normal limits. The visualized skeletal structures are unremarkable. IMPRESSION: No active disease. Electronically Signed   By: Virgina Norfolk M.D.   On: 07/06/2020 20:10   DG Abd Portable 1V  Result Date: 07/07/2020 CLINICAL DATA:  Diffuse abdominal pain, abdominal distension EXAM: PORTABLE ABDOMEN - 1 VIEW COMPARISON:  07/06/2020 FINDINGS: The colon rectum appears diffusely gas-filled with the cecum dilated to approximately 10.1 cm, similar to that noted on prior examination. The findings are  suggestive of Sundi Slevin colonic ileus. Small amount of stool noted within the cecum. No gross free intraperitoneal gas. Foley catheter balloon overlies the mid pelvis. IMPRESSION: Stable diffuse gaseous distension of the colon suggestive of Khristine Verno colonic ileus. Electronically Signed   By: Fidela Salisbury MD   On: 07/07/2020 09:45        Scheduled Meds: . bisacodyl  10 mg Rectal BID  . Chlorhexidine Gluconate Cloth  6 each Topical Daily  . diclofenac Sodium  2 g Topical QID  . feeding supplement  237 mL Oral BID BM  . insulin aspart  0-9 Units Subcutaneous Q4H  . lidocaine  1 patch Transdermal Q24H  . pantoprazole (PROTONIX) IV  40 mg Intravenous Q24H   Continuous Infusions: . dextrose 5% lactated ringers with KCl 20 mEq/L 75 mL/hr at 07/07/20 1730  . diltiazem (CARDIZEM) infusion 5 mg/hr (07/07/20 0345)  . potassium chloride 10 mEq (07/07/20 1731)     LOS: 2 days    Time spent: over 30 min    Fayrene Helper, MD [facility]   To contact the attending provider between 7A-7P or the covering provider during after hours 7P-7A, please log into the web site [email or web address] and access using universal [place] password for that web site. If you do not have the password, please call the hospital operator.  07/07/2020, 6:04 PM

## 2020-07-07 NOTE — Progress Notes (Signed)
[place] for heparin Indication: atrial fibrillation  Allergies  Allergen Reactions  . Soma [Carisoprodol] Other (See Comments)    "did a number on me"  . Doxazosin Other (See Comments)    dizziness    Patient Measurements: Height: 5\' 11"  (180.3 cm) Weight: 68.5 kg (151 lb 0.2 oz) IBW/kg (Calculated) : 75.3 Heparin Dosing Weight: 65kg  Vital Signs: Temp: 97.6 F (36.4 C) (10/20 2016) Temp Source: Oral (10/20 2016) BP: 152/69 (10/20 2016) Pulse Rate: 85 (10/20 2016)  Labs: Recent Labs    07/04/20 2237 07/04/20 2237 07/04/20 2256 07/05/20 0402 07/05/20 0402 07/05/20 0945 07/06/20 0314 07/06/20 0314 07/06/20 1127 07/06/20 1248 07/06/20 1523 07/06/20 1717 07/06/20 1957 07/07/20 0406  HGB 9.2*   < >  --   --   --   --  7.6*   < > 8.1*  --   --   --   --  7.4*  HCT 30.9*   < >  --   --   --   --  26.7*  --  26.5*  --   --   --   --  24.5*  PLT 219  --   --   --   --   --  216  --   --   --   --   --   --  194  APTT  --    < >  --  44*  --   --  95*   < > 65*  --   --   --  105* 92*  HEPARINUNFRC  --   --   --  >2.20*  --   --   --   --   --   --   --   --  1.30*  --   CREATININE 1.01  --    < > 0.91  --  0.99 0.92  --   --   --   --   --   --   --   TROPONINIHS  --   --    < > 107*   < >  --   --   --   --  91* 81* 86*  --   --    < > = values in this interval not displayed.    Estimated Creatinine Clearance: 58.9 mL/min (by C-G formula based on SCr of 0.92 mg/dL).   Assessment: 86 year old male with history of metastatic prostate cancer, atrial fibrillation anticoagulated on rivaroxaban, diabetes comes in with abdominal pain and distention and no bowel movement for the last 3 days.  Pharmacy consulted to dose heparin for AFib.  LD xarelto 10/18 @ 1800  Baseline aPTT 44 and HL >2.2  07/07/2020 4:50 AM  APTT 92 therapeutic  No bleeding or complications with IV noted  Goal of Therapy:  Heparin level 0.3-0.7 units/ml   APTT 66-102 seconds Monitor platelets by anticoagulation protocol   Plan:  continue Heparin drip at 1000 units/hr APTT/heparin level  8 hours  Daily CBC, daily aPTT/heparin level F/u ileus resolution and ability to resume PTA [place] 07/07/2020, 4:52 AM

## 2020-07-07 NOTE — Progress Notes (Signed)
  Echocardiogram 2D Echocardiogram has been performed.  Randa Lynn Daisuke Bailey 07/07/2020, 12:17 PM

## 2020-07-08 ENCOUNTER — Inpatient Hospital Stay (HOSPITAL_COMMUNITY): Payer: Medicare Other

## 2020-07-08 DIAGNOSIS — I2510 Atherosclerotic heart disease of native coronary artery without angina pectoris: Secondary | ICD-10-CM | POA: Diagnosis not present

## 2020-07-08 DIAGNOSIS — K567 Ileus, unspecified: Secondary | ICD-10-CM | POA: Diagnosis not present

## 2020-07-08 DIAGNOSIS — E1122 Type 2 diabetes mellitus with diabetic chronic kidney disease: Secondary | ICD-10-CM | POA: Diagnosis not present

## 2020-07-08 DIAGNOSIS — I4891 Unspecified atrial fibrillation: Secondary | ICD-10-CM | POA: Diagnosis not present

## 2020-07-08 LAB — CBC WITH DIFFERENTIAL/PLATELET
Abs Immature Granulocytes: 0.6 10*3/uL — ABNORMAL HIGH (ref 0.00–0.07)
Band Neutrophils: 6 %
Basophils Absolute: 0 10*3/uL (ref 0.0–0.1)
Basophils Relative: 0 %
Eosinophils Absolute: 0 10*3/uL (ref 0.0–0.5)
Eosinophils Relative: 0 %
HCT: 28 % — ABNORMAL LOW (ref 39.0–52.0)
Hemoglobin: 8.5 g/dL — ABNORMAL LOW (ref 13.0–17.0)
Lymphocytes Relative: 10 %
Lymphs Abs: 0.7 10*3/uL (ref 0.7–4.0)
MCH: 24.5 pg — ABNORMAL LOW (ref 26.0–34.0)
MCHC: 30.4 g/dL (ref 30.0–36.0)
MCV: 80.7 fL (ref 80.0–100.0)
Metamyelocytes Relative: 2 %
Monocytes Absolute: 0.3 10*3/uL (ref 0.1–1.0)
Monocytes Relative: 5 %
Myelocytes: 7 %
Neutro Abs: 5.1 10*3/uL (ref 1.7–7.7)
Neutrophils Relative %: 70 %
Platelets: 134 10*3/uL — ABNORMAL LOW (ref 150–400)
RBC: 3.47 MIL/uL — ABNORMAL LOW (ref 4.22–5.81)
RDW: 20.6 % — ABNORMAL HIGH (ref 11.5–15.5)
WBC: 6.7 10*3/uL (ref 4.0–10.5)
nRBC: 0.9 % — ABNORMAL HIGH (ref 0.0–0.2)

## 2020-07-08 LAB — GLUCOSE, CAPILLARY
Glucose-Capillary: 102 mg/dL — ABNORMAL HIGH (ref 70–99)
Glucose-Capillary: 130 mg/dL — ABNORMAL HIGH (ref 70–99)
Glucose-Capillary: 135 mg/dL — ABNORMAL HIGH (ref 70–99)
Glucose-Capillary: 167 mg/dL — ABNORMAL HIGH (ref 70–99)
Glucose-Capillary: 170 mg/dL — ABNORMAL HIGH (ref 70–99)
Glucose-Capillary: 172 mg/dL — ABNORMAL HIGH (ref 70–99)
Glucose-Capillary: 179 mg/dL — ABNORMAL HIGH (ref 70–99)

## 2020-07-08 LAB — FOLATE: Folate: 13.1 ng/mL (ref >5.9)

## 2020-07-08 LAB — ECHOCARDIOGRAM COMPLETE
Area-P 1/2: 2.32 cm2
Height: 71 in
S' Lateral: 2.9 cm
Weight: 2416.24 oz

## 2020-07-08 LAB — COMPREHENSIVE METABOLIC PANEL
ALT: 19 U/L (ref 0–44)
AST: 87 U/L — ABNORMAL HIGH (ref 15–41)
Albumin: 1.9 g/dL — ABNORMAL LOW (ref 3.5–5.0)
Alkaline Phosphatase: 863 U/L — ABNORMAL HIGH (ref 38–126)
Anion gap: 10 (ref 5–15)
BUN: 16 mg/dL (ref 8–23)
CO2: 23 mmol/L (ref 22–32)
Calcium: 7.1 mg/dL — ABNORMAL LOW (ref 8.9–10.3)
Chloride: 110 mmol/L (ref 98–111)
Creatinine, Ser: 0.73 mg/dL (ref 0.61–1.24)
GFR, Estimated: >60 mL/min (ref >60)
Glucose, Bld: 121 mg/dL — ABNORMAL HIGH (ref 70–99)
Potassium: 3.4 mmol/L — ABNORMAL LOW (ref 3.5–5.1)
Sodium: 143 mmol/L (ref 135–145)
Total Bilirubin: 1.6 mg/dL — ABNORMAL HIGH (ref 0.3–1.2)
Total Protein: 6.3 g/dL — ABNORMAL LOW (ref 6.5–8.1)

## 2020-07-08 LAB — PHOSPHORUS: Phosphorus: 2.6 mg/dL (ref 2.5–4.6)

## 2020-07-08 LAB — FERRITIN: Ferritin: 5912 ng/mL — ABNORMAL HIGH (ref 24–336)

## 2020-07-08 LAB — IRON AND TIBC
Iron: 56 ug/dL (ref 45–182)
Saturation Ratios: 38 % (ref 17.9–39.5)
TIBC: 149 ug/dL — ABNORMAL LOW (ref 250–450)
UIBC: 93 ug/dL

## 2020-07-08 LAB — MAGNESIUM: Magnesium: 2.1 mg/dL (ref 1.7–2.4)

## 2020-07-08 LAB — VITAMIN B12: Vitamin B-12: 2484 pg/mL — ABNORMAL HIGH (ref 180–914)

## 2020-07-08 LAB — MRSA PCR SCREENING: MRSA by PCR: NEGATIVE

## 2020-07-08 LAB — CALCIUM, IONIZED: Calcium, Ionized, Serum: 4.1 mg/dL — ABNORMAL LOW (ref 4.5–5.6)

## 2020-07-08 MED ORDER — ATORVASTATIN CALCIUM 20 MG PO TABS
20.0000 mg | ORAL_TABLET | Freq: Every day | ORAL | Status: DC
  Administered 2020-07-08 – 2020-07-15 (×8): 20 mg via ORAL
  Filled 2020-07-08 (×7): qty 1

## 2020-07-08 MED ORDER — POTASSIUM CHLORIDE 10 MEQ/100ML IV SOLN: 10.0000 meq | Freq: Once | INTRAVENOUS | Status: DC

## 2020-07-08 MED ORDER — POTASSIUM CHLORIDE CRYS ER 20 MEQ PO TBCR
40.0000 meq | EXTENDED_RELEASE_TABLET | ORAL | Status: AC
  Administered 2020-07-08 (×2): 40 meq via ORAL
  Filled 2020-07-08 (×2): qty 2

## 2020-07-08 MED ORDER — ISOSORBIDE MONONITRATE ER 30 MG PO TB24: 30.0000 mg | ORAL_TABLET | Freq: Every day | ORAL | Status: DC

## 2020-07-08 MED ORDER — HEPARIN (PORCINE) 25000 UT/250ML-% IV SOLN: 1000.0000 [IU]/h | INTRAVENOUS | Status: DC

## 2020-07-08 MED ORDER — SODIUM CHLORIDE 0.9 % IV SOLN
2.0000 g | INTRAVENOUS | Status: AC
  Administered 2020-07-08 – 2020-07-12 (×5): 2 g via INTRAVENOUS
  Filled 2020-07-08: qty 20
  Filled 2020-07-08: qty 2
  Filled 2020-07-08 (×2): qty 20
  Filled 2020-07-08: qty 2

## 2020-07-08 MED ORDER — DOXYCYCLINE HYCLATE 100 MG PO TABS
100.0000 mg | ORAL_TABLET | Freq: Two times a day (BID) | ORAL | Status: DC
  Administered 2020-07-08 – 2020-07-14 (×13): 100 mg via ORAL
  Filled 2020-07-08 (×12): qty 1

## 2020-07-08 MED ORDER — METOPROLOL SUCCINATE ER 25 MG PO TB24
25.0000 mg | ORAL_TABLET | Freq: Every day | ORAL | Status: DC
  Administered 2020-07-08 – 2020-07-09 (×2): 25 mg via ORAL
  Filled 2020-07-08: qty 1

## 2020-07-08 MED ORDER — FUROSEMIDE 10 MG/ML IJ SOLN
20.0000 mg | Freq: Once | INTRAMUSCULAR | Status: AC
  Administered 2020-07-08: 20 mg via INTRAVENOUS
  Filled 2020-07-08: qty 2

## 2020-07-08 NOTE — Progress Notes (Signed)
MD Florene Glen notified for foley orders.

## 2020-07-08 NOTE — Progress Notes (Addendum)
[place] for heparin Indication: atrial fibrillation  Allergies  Allergen Reactions  . Soma [Carisoprodol] Other (See Comments)    "did a number on me"  . Doxazosin Other (See Comments)    dizziness    Patient Measurements: Height: 5\' 11"  (180.3 cm) Weight: 68.5 kg (151 lb 0.2 oz) IBW/kg (Calculated) : 75.3 Heparin Dosing Weight: 65kg  Vital Signs: Temp: 98.3 F (36.8 C) (10/22 1229) Temp Source: Oral (10/22 1229) BP: 120/71 (10/22 1238) Pulse Rate: 98 (10/22 1238)  Labs: Recent Labs     0000 07/06/20 0314 07/06/20 0314 07/06/20 1127 07/06/20 1127 07/06/20 1248 07/06/20 1523 07/06/20 1717 07/06/20 1957 07/07/20 0406 07/07/20 0406 07/07/20 0407 07/07/20 1358 07/07/20 1824 07/08/20 0606  HGB  --  7.6*   < > 8.1*   < >  --   --   --   --  7.4*   < >  --   --  6.9* 8.5*  HCT  --  26.7*   < > 26.5*   < >  --   --   --   --  24.5*  --   --   --  23.1* 28.0*  PLT  --  216  --   --   --   --   --   --   --  194  --   --   --   --  134*  APTT  --  95*   < > 65*  --   --   --   --  105* 92*  --   --   --   --   --   HEPARINUNFRC  --   --   --   --   --   --   --   --  1.30*  --   --  0.90*  --   --   --   CREATININE   < > 0.92  --   --   --   --   --   --   --  0.68  --   --  0.75  --  0.73  TROPONINIHS  --   --   --   --   --  91* 81* 86*  --   --   --   --   --   --   --    < > = values in this interval not displayed.    Estimated Creatinine Clearance: 67.8 mL/min (by C-G formula based on SCr of 0.73 mg/dL).   Assessment: 86 year old male with history of metastatic prostate cancer, atrial fibrillation anticoagulated on rivaroxaban, diabetes comes in with abdominal pain and distention and no bowel movement for the last 3 days.  Pharmacy consulted to dose heparin for AFib.  LD xarelto 10/18 @ 1800  Baseline aPTT 44 and HL >2.2 Heparin held 10/21 for hematuria; resumed 10/22 PM  07/08/2020 4:53 PM  Hgb now improved s/t  transfusion, although now Plt slightly low  No bleeding or infusion issues noted  Hematuria now resolved; MD wanting to resume heparin tonight w/o bolus  Goal of Therapy:  Heparin level 0.3-0.7 units/ml  APTT 66-102 seconds Monitor platelets by anticoagulation protocol   Plan:   Resume heparin drip at previous rate of 1000 units/hr; no bolus  Check 8-hr APTT/heparin level  Daily CBC and heparin level  F/u ileus resolution and ability to resume PTA Friedens, PharmD, BCPS [phone] 07/08/2020, 4:54  PM   ADDENDUM  After further consultation with Cards, MD opting to hold off on resuming heparin (was not started by RN)  Consult and all heparin orders/labs discontinued; please reconsult Pharmacy as appropriate  Reuel Boom, PharmD, BCPS [phone] 07/08/2020, 8:39 PM

## 2020-07-08 NOTE — Progress Notes (Addendum)
[facility] Progress Note  CC:  Ileus   Subjective: He appears more fatigued and weak today. No chest or abdominal pain. No SOB. No further BMs this morning. No family at the bedside.   Objective:  Vital signs in last 24 hours: Temp:  [98 F (36.7 C)-99.8 F (37.7 C)] 98.7 F (37.1 C) (10/22 0514) Pulse Rate:  [79-95] 86 (10/22 0514) Resp:  [15-19] 19 (10/22 0514) BP: (123-141)/(63-71) 141/65 (10/22 0514) SpO2:  [91 %-99 %] 91 % (10/22 0514) Last BM Date: 07/07/20   General:  Ill appearing 86 year old male in no acute distress.  Heart: Irregular rhythm, no murmur.  Pulm: Audible rattle in upper airway with congested breath sounds throughout lung fields.  Abdomen: No significant distension, significantly improved when compared to yesterdays abdomina exam. Nontender. + BS x 4 quads. Rectal tube is out.  Extremities:  UEs 1+ edema. No LE edema  Neurologic:  Alert and  oriented x 4. Generalized weakness. LUE weakness (since CVA). Speech is a bit difficult to understand when not wearing dentures.  Psych:  Alert and cooperative. Normal mood and affect.  Intake/Output from previous day: 10/21 0701 - 10/22 0700 In: 945.4 [I.V.:539.2; Blood:406.3] Out: 500 [Urine:500] Intake/Output this shift: No intake/output data recorded.  Lab Results: Recent Labs    07/06/20 0314 07/06/20 1127 07/07/20 0406 07/07/20 1824 07/08/20 0606  WBC 7.6  --  8.3  --  6.7  HGB 7.6*   < > 7.4* 6.9* 8.5*  HCT 26.7*   < > 24.5* 23.1* 28.0*  PLT 216  --  194  --  134*   < > = values in this interval not displayed.   BMET Recent Labs    07/07/20 0406 07/07/20 1358 07/08/20 0606  NA 140 142 143  K 3.3* 3.3* 3.4*  CL 108 108 110  CO2 20* 23 23  GLUCOSE 142* 204* 121*  BUN _0 CREATININE 0.68 0.75 0.73  CALCIUM 7.3* 6.9* 7.1*   LFT Recent Labs    07/08/20 0606  PROT 6.3*  ALBUMIN 1.9*  AST 87*  ALT 19  ALKPHOS 863*  BILITOT 1.6*   PT/INR No results for  input(s): LABPROT, INR in the last 72 hours. Hepatitis Panel No results for input(s): HEPBSAG, HCVAB, HEPAIGM, HEPBIGM in the last 72 hours.  DG Abd 1 View  Result Date: 07/06/2020 CLINICAL DATA:  Abdominal pain EXAM: ABDOMEN - 1 VIEW COMPARISON:  CT abdomen and pelvis 07/05/2020 FINDINGS: Diffuse gaseous distention of colon increased from previous exam. This includes extensive gaseous distension of the sigmoid loop. No definite gas in rectum or bowel wall thickening identified. Increased stool in RIGHT colon. Small bowel decompressed. Osseous demineralization with degenerative disc/facet disease changes and dextroconvex scoliosis lumbar spine. No urinary tract calcification definitely visualized. Scattered pelvic phleboliths. IMPRESSION: Gaseous distention of colon increased from previous exam, especially sigmoid loop. No evidence of sigmoid volvulus was identified on the prior CT. Findings favor diffuse colonic ileus; consider repeat CT if patient's symptoms persist or worsen. Electronically Signed   By: Lavonia Dana M.D.   On: 07/06/2020 10:36   DG CHEST PORT 1 VIEW  Result Date: 07/07/2020 CLINICAL DATA:  Abdominal pain. EXAM: PORTABLE CHEST 1 VIEW COMPARISON:  July 06, 2020. FINDINGS: The heart size and mediastinal contours are within normal limits. Both lungs are clear. Stable elevated left hemidiaphragm is noted. No pneumothorax or pleural effusion is noted. The visualized skeletal structures are unremarkable. IMPRESSION: No  active disease. Electronically Signed   By: James  Green Jr M.D.   On: 07/07/2020 19:32   DG CHEST PORT 1 VIEW  Result Date: 07/06/2020 CLINICAL DATA:  Chest pain. EXAM: PORTABLE CHEST 1 VIEW COMPARISON:  July 04, 2020 FINDINGS: Low lung volumes are noted. There is no evidence of acute infiltrate, pleural effusion or pneumothorax. The heart size and mediastinal contours are within normal limits. The visualized skeletal structures are unremarkable. IMPRESSION: No  active disease. Electronically Signed   By: Thaddeus  Houston M.D.   On: 07/06/2020 20:10   DG Abd Portable 1V  Result Date: 07/07/2020 CLINICAL DATA:  Diffuse abdominal pain, abdominal distension EXAM: PORTABLE ABDOMEN - 1 VIEW COMPARISON:  07/06/2020 FINDINGS: The colon rectum appears diffusely gas-filled with the cecum dilated to approximately 10.1 cm, similar to that noted on prior examination. The findings are suggestive of a colonic ileus. Small amount of stool noted within the cecum. No gross free intraperitoneal gas. Foley catheter balloon overlies the mid pelvis. IMPRESSION: Stable diffuse gaseous distension of the colon suggestive of a colonic ileus. Electronically Signed   By: Ashesh  Parikh MD   On: 07/07/2020 09:45    Assessment / Plan:  1. 85-year-old male with prostate cancer with bone metastasis admitted to the hospital with generalized abdominal pain with abdominal distention. CTAPconsistent with acolonicileus. Hypokalemia most likely attributing to his ileus. Persistent hypokalemia despite continued KCL IV  replacement. Today K+ 3.4. A rectal tube was placed 10/20 without significant decompression. Rectal exam with stimulation by Dr. Mansouraty 10/21 resulted in passing a large amount of gaseous flatus and loose stool with significant decompression. Receiving Dulcolax suppositories bid for a total of 3 days. His colonic ileus is resolving, however, his overall clinical status appears to be declining.  No plans for endoscopic evaluation at this time Clear liquid diet if he is alert with the head of the bed elevated KCL IV replacement per the hospitalist   Check electrolytes daily Turn the patient Q 2 hours  2. Atrial fibrillation with RVR.He received a Cardizem IV bolus followed by an infusion. Xarelto on hold. Heparin dc'd due to hematuria.   3. CAD s/p stent 2017.He reported having chest pain 10/20. EKG and chest xray were negative.  Lungs are congested this am. He  received 20mg IV Lasix post PRBC transfusion at 6am. Chest xray pending.   4. Anemia. Hg 7.6 -> 8.1 -> 7.4 -> 6.9 (base line Hg 9.3 - 10 on 05/2020). He receive 1 unit of PRBCs overnight, infusion completed at 5:08 am. Post transfusion Hg 8.5 (collected at 6:06AM). Iron 60.  No further significant hematuria since heparin infusion discontinued. No obvious GI bleeding.  -Repeat CBC in am -Recommend PRBC transfusion for Hg < 7.5  5. Elevated alk phos (most likely due to bone mets). LFTs elevated. Alk phos 501 ->750 -> 863. AST 71 -> 107 -> 87. ALT 34 -> 25 -> 19. T. Bili 0.7 -> 1.6.  GGT 26. CTAP showed a normal liver without evidence of biliary dilatation. -Repeat hepatic panel in am -Consider MRI/MRCP if Alk phos, T. Bili and LFTs continue to increase   6. Hiatal hernia  7. Atrophic pancreas with calcification to the head of the pancreas, most likely chronic pancreatitis  8. Prostate cancer with bone metastasis  9. History of a tubular adenomatous colon polyp per colonoscopy 2011  10 CVA 04/2020  Further recommendations per Dr. Danis      Principal Problem:   Ileus (HCC) Active Problems:     Essential hypertension   Uncontrolled type 2 diabetes mellitus with diabetic polyneuropathy, without long-term current use of insulin ([place])   CKD stage 3 due to type 2 diabetes mellitus ([place])   History of CVA (cerebrovascular accident)   CAD S/P percutaneous coronary angioplasty   Prostate cancer ([place])   Elevated troponin   Pressure injury of skin   Hypokalemia   Atrial fibrillation with RVR ([place])     LOS: 3 days   Noralyn Pick  07/08/2020, 8:11 AM  I have discussed the case with the PA, and that is the plan I formulated. I personally interviewed and examined the patient.  This patient's abdominal exam is much improved after manual decompression by Dr. Rush Landmark yesterday.  Abdomen mildly tympanic, not distended, still decreased bowel sounds. He is still frail and  acutely ill.  No need for endoscopic management.  I discussed the case at length with Dr. Florene Glen, and we have agreed to start a full liquid diet with some protein calorie supplements and follow him closely.  If the patient develops recurrent distention such as he had a couple days ago, then nursing should turn the patient to the side and perform a gentle rectal exam to partially hold open the anal canal while gently massaging the abdomen for about 5 minutes to expel retained gas from the colon.  I will most likely check on him Sunday, but call me sooner if needed.   Nelida Meuse III Office: [phone]

## 2020-07-08 NOTE — Progress Notes (Addendum)
PROGRESS NOTE    Adrian Singh  JHE:[id number] DOB: 1933/12/03 DOA: 07/04/2020 PCP: [name], MD  Chief Complaint  Patient presents with  . Abdominal Pain   Brief Narrative:  Adrian Singh 86 y.o.WM PMHx CAD status post stenting, Chase Arnall. 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 Denies any vomiting fever or chills. Abdominal discomfort is generalized. Was able to take his medications.  Assessment & Plan:   Principal Problem:   Ileus ([place]) Active Problems:   Essential hypertension   Uncontrolled type 2 diabetes mellitus with diabetic polyneuropathy, without long-term current use of insulin (HCC)   CKD stage 3 due to type 2 diabetes mellitus ([place])   History of CVA (cerebrovascular accident)   CAD S/P percutaneous coronary angioplasty   Prostate cancer (HCC)   Elevated troponin   Pressure injury of skin   Hypokalemia   Atrial fibrillation with RVR (HCC)  Ileus - CT scan consistent with ileus -- KUB 10/21 with stable diffuse gaseous distension -- continued abdominal distension today, though improved -- will consider repeat CT scan if patient with worsening pain/symptoms -- replace mag/K as needed to keep >2/4 respectively -- seems to be improving, follow with full liquid diet -- will discuss with GI, appreciate recommendations  Elevated troponin  Chest Pain  Coronary Artery Disease s/p stenting -- c/o intermittent chest discomfort  10/20, unclear etiology, he c/o diffuse pain at times.  Troponin was elevated, but relatively flat, not c/w ACS and EKG was without changes concerning for ACS. -- echo with EF to 40-45%, diffuse hypokinesis and akinesis in basal and mid inferior and inferolateral walls -- will consult cardiology given decreased EF -- will continue to monitor, asymptomatic today  HCAP Start abx with ceftriaxone/doxy for now SLP eval for possible aspiration Follow MRSA and sputum cx if able Repeat CXR 10/23 am  Hypokalemia  Hypophosphatemia - replace and follow   Elevated LFT's  Elevated Alk Phos:  - normal bilirubin, GGT wnl -> suspect alk phos 2/2 bone mets - Appreciate Gi assistance, will continue to follow, may need additional imaging  Chronic pain syndrome -Morphine PRN --at home is on tramadol per med rec  Chronic systolic CHF -Strict in and out -Daily weight  --caution with IVF, takes 40 mg lasix daily as need at home --echo with decreased EF 40-45% with diffuse hypokinesis, appreciate cardiology recs  Chronic anemia -labs suggestive of AOCD -- hb 6.9 today, received 1 unit pRBC - hypoprolieferative retics - continue to trend  Fib with RVR -Cardizem drip - continue while NPO - rate improved -Heparin drip per pharmacy some hematuria noted 10/21, was on hold, hematuria resolved will defer heparin to cardiology  Hematuria - while on heparin gtt, follow UA - hold heparin for now - UA with >50 RBC's, urine appears clear this AM  Cough Follow chest x ray  NAGMA - improved, continue to monitor  DM type II controlled with complication; DM gastroparesis  Hypoglycemia -10/6 hemoglobin A1c= 6.5 -Sensitive SSI -- follow with D5 containing fluids  Metastatic prostate cancer  Hx CVA  with residual left sided weakness and dysarthria  DVT prophylaxis: heparin  Code Status: full  Family Communication: none at bedside - grandson, daughter Disposition:   Status is:  Inpatient  Remains inpatient appropriate because:Inpatient level of care appropriate due to severity of illness   Dispo: The patient is from: Home              Anticipated d/c is to: pending              Anticipated d/c date is: > 3 days              Patient currently is not medically stable to d/c.    Consultants:   GI  Procedures:  none  Antimicrobials:  Anti-infectives (From admission, onward)   None     Subjective: No CP Adrian Singh&Ox3 Sleepy this morning  Objective: Vitals:   07/07/20 2142 07/08/20 0051 07/08/20 0514 07/08/20 0906  BP: 133/63 125/65 (!) 141/65 100/64  Pulse: 79 83 86 (!) 105  Resp: '16 17 19   ' Temp: 99.5 F (37.5 C) 99.8 F (37.7 C) 98.7 F (37.1 C)   TempSrc: Axillary Axillary Oral   SpO2: 97% 99% 91%   Weight:      Height:        Intake/Output Summary (Last 24 hours) at 07/08/2020 1122 Last data filed at 07/08/2020 1107 Gross per 24 hour  Intake 945.4 ml  Output 1725 ml  Net -779.6 ml   Filed Weights   07/04/20 2240 07/05/20 0600 07/06/20 0417  Weight: 65 kg 67 kg 68.5 kg    Examination:  General: No acute distress.  Sleepy this morning, talks to me with eyes closed, but wakes up towards end of interview Cardiovascular: irregularly irregular Lungs: Clear to auscultation bilaterally  Abdomen: Soft, nontender, nondistended Neurological: Alert and oriented 3.chronic L sided weakness. Cranial nerves II through XII grossly intact. Skin: Warm and dry. No rashes or lesions. Extremities: No clubbing or cyanosis. No edema.   Data Reviewed: I have personally reviewed following labs and imaging studies  CBC: Recent Labs  Lab 07/04/20 2237 07/04/20 2237 07/06/20 0314 07/06/20 1127 07/07/20 0406 07/07/20 1824 07/08/20 0606  WBC 6.6  --  7.6  --  8.3  --  6.7  NEUTROABS  --   --  5.0  --  5.8  --  5.1  HGB 9.2*   < > 7.6* 8.1* 7.4* 6.9* 8.5*  HCT 30.9*   < > 26.7* 26.5* 24.5* 23.1* 28.0*  MCV 79.6*  --  80.9  --  78.3*  --  80.7  PLT  219  --  216  --  194  --  134*   < > = values in this interval not displayed.    Basic Metabolic Panel: Recent Labs  Lab 07/05/20 0945 07/06/20 0314 07/07/20 0406 07/07/20 1358 07/08/20 0606  NA 138 138 140 142 143  K 3.1* 4.0 3.3* 3.3* 3.4*  CL 107 111 108 108 110  CO2 17* 16* 20* 23 23  GLUCOSE 171* 78 142* 204* 121*  BUN 39* 38* '23 21 16  ' CREATININE 0.99 0.92 0.68 0.75 0.73  CALCIUM 7.7* 7.7* 7.3* 6.9* 7.1*  MG 2.3 2.5* 2.0 2.1 2.1  PHOS 2.6 1.7* 3.0  --  2.6    GFR: Estimated Creatinine Clearance: 67.8 mL/min (by C-G formula based on SCr of 0.73 mg/dL).  Liver Function Tests: Recent Labs  Lab 07/04/20 2237 07/05/20 0402 07/06/20 0314 07/07/20 0406 07/08/20 0606  AST 58*  73* 71* 107* 87*  ALT 30 37 34 25 19  ALKPHOS 564* 491* 501* 750* 863*  BILITOT 0.8 0.8 0.8 0.7 1.6*  PROT 7.2 6.7 6.9 6.5 6.3*  ALBUMIN 2.0* 1.8* 2.1* 2.0* 1.9*    CBG: Recent Labs  Lab 07/07/20 1638 07/07/20 2143 07/08/20 0011 07/08/20 0413 07/08/20 0741  GLUCAP 171* 186* 167* 102* 135*     Recent Results (from the past 240 hour(s))  Respiratory Panel by RT PCR (Flu Karlo Goeden&B, Covid) - Nasopharyngeal Swab     Status: None   Collection Time: 07/04/20 10:56 PM   Specimen: Nasopharyngeal Swab  Result Value Ref Range Status   SARS Coronavirus 2 by RT PCR NEGATIVE NEGATIVE Final    Comment: (NOTE) SARS-CoV-2 target nucleic acids are NOT DETECTED.  The SARS-CoV-2 RNA is generally detectable in upper respiratoy specimens during the acute phase of infection. The lowest concentration of SARS-CoV-2 viral copies this assay can detect is 131 copies/mL. Garen Woolbright negative result does not preclude SARS-Cov-2 infection and should not be used as the sole basis for treatment or other patient management decisions. Veron Senner negative result may occur with  improper specimen collection/handling, submission of specimen other than nasopharyngeal swab, presence of viral mutation(s) within the areas targeted by this  assay, and inadequate number of viral copies (<131 copies/mL). Khiya Friese negative result must be combined with clinical observations, patient history, and epidemiological information. The expected result is Negative.  Fact Sheet for Patients:  PinkCheek.be  Fact Sheet for Healthcare Providers:  GravelBags.it  This test is no t yet approved or cleared by the [place] FDA and  has been authorized for detection and/or diagnosis of SARS-CoV-2 by FDA under an Emergency Use Authorization (EUA). This EUA will remain  in effect (meaning this test can be used) for the duration of the COVID-19 declaration under Section 564(b)(1) of the Act, 21 U.S.C. section 360bbb-3(b)(1), unless the authorization is terminated or revoked sooner.     Influenza Thera Basden by PCR NEGATIVE NEGATIVE Final   Influenza B by PCR NEGATIVE NEGATIVE Final    Comment: (NOTE) The Xpert Xpress SARS-CoV-2/FLU/RSV assay is intended as an aid in  the diagnosis of influenza from Nasopharyngeal swab specimens and  should not be used as Vannessa Godown sole basis for treatment. Nasal washings and  aspirates are unacceptable for Xpert Xpress SARS-CoV-2/FLU/RSV  testing.  Fact Sheet for Patients: PinkCheek.be  Fact Sheet for Healthcare Providers: GravelBags.it  This test is not yet approved or cleared by the [place] FDA and  has been authorized for detection and/or diagnosis of SARS-CoV-2 by  FDA under an Emergency Use Authorization (EUA). This EUA will remain  in effect (meaning this test can be used) for the duration of the  Covid-19 declaration under Section 564(b)(1) of the Act, 21  U.S.C. section 360bbb-3(b)(1), unless the authorization is  terminated or revoked. Performed at [facility], [place] [address] [place], [place] [address]   MRSA PCR Screening     Status: None   Collection Time: 07/05/20   6:08 AM   Specimen: Nasopharyngeal  Result Value Ref Range Status   MRSA by PCR NEGATIVE NEGATIVE Final    Comment:        The GeneXpert MRSA Assay (FDA approved for NASAL specimens only), is one component of Carlee Vonderhaar comprehensive MRSA colonization surveillance program. It is not intended to diagnose MRSA infection nor to guide or monitor treatment for MRSA infections. Performed at [facility], [address] [place], [place]  [address]          Radiology Studies: DG CHEST PORT 1 VIEW  Result Date: 07/08/2020 CLINICAL DATA:  [place]. EXAM: PORTABLE CHEST 1 VIEW COMPARISON:  07/07/2020.  CT 07/05/2020.  Multiple older films. FINDINGS: Heart size is normal. Right lung is clear. Infiltrate/volume loss at the left lung base consistent with pneumonia. No dense consolidation, lobar collapse or visible effusion by radiography. Small amount of pleural fluid visible on CT of 3 days ago. Moderate size hiatal hernia as seen previously. No acute bone finding. IMPRESSION: Persistent mild infiltrate/volume loss at the left lung base consistent with pneumonia. Electronically Signed   By: Nelson Chimes M.D.   On: 07/08/2020 08:30   DG CHEST PORT 1 VIEW  Result Date: 07/07/2020 CLINICAL DATA:  Abdominal pain. EXAM: PORTABLE CHEST 1 VIEW COMPARISON:  July 06, 2020. FINDINGS: The heart size and mediastinal contours are within normal limits. Both lungs are clear. Stable elevated left hemidiaphragm is noted. No pneumothorax or pleural effusion is noted. The visualized skeletal structures are unremarkable. IMPRESSION: No active disease. Electronically Signed   By: Marijo Conception M.D.   On: 07/07/2020 19:32   DG CHEST PORT 1 VIEW  Result Date: 07/06/2020 CLINICAL DATA:  Chest pain. EXAM: PORTABLE CHEST 1 VIEW COMPARISON:  July 04, 2020 FINDINGS: Low lung volumes are noted. There is no evidence of acute infiltrate, pleural effusion or pneumothorax. The heart size and mediastinal  contours are within normal limits. The visualized skeletal structures are unremarkable. IMPRESSION: No active disease. Electronically Signed   By: Virgina Norfolk M.D.   On: 07/06/2020 20:10   DG Abd Portable 1V  Result Date: 07/07/2020 CLINICAL DATA:  Diffuse abdominal pain, abdominal distension EXAM: PORTABLE ABDOMEN - 1 VIEW COMPARISON:  07/06/2020 FINDINGS: The colon rectum appears diffusely gas-filled with the cecum dilated to approximately 10.1 cm, similar to that noted on prior examination. The findings are suggestive of Maryrose Colvin colonic ileus. Small amount of stool noted within the cecum. No gross free intraperitoneal gas. Foley catheter balloon overlies the mid pelvis. IMPRESSION: Stable diffuse gaseous distension of the colon suggestive of Creta Dorame colonic ileus. Electronically Signed   By: Fidela Salisbury MD   On: 07/07/2020 09:45   ECHOCARDIOGRAM COMPLETE  Result Date: 07/08/2020    ECHOCARDIOGRAM REPORT   Patient Name:   Keiondre Cherylynn Ridges Date of Exam: 07/07/2020 Medical Rec #:  [id number]      Height:       71.0 in Accession #:    [id number]     Weight:       151.0 lb Date of Birth:  10-23-1933     BSA:          1.871 m Patient Age:    44 years       BP:           149/79 mmHg Patient Gender: M              HR:           71 bpm. Exam Location:  Inpatient Procedure: 2D Echo, Cardiac Doppler and Color Doppler Indications:    Elevated Troponin  History:        Patient has prior history of Echocardiogram examinations, most                 recent 05/01/2020. CAD, Stroke, Arrythmias:Atrial Fibrillation;                 Risk Factors:Hypertension, Diabetes and GERD. Cancer.  Sonographer:    Jonelle Sidle Dance Referring Phys: [phone] Andrea Colglazier CALDWELL POWELL JR  Sonographer Comments: Suboptimal subcostal window. IMPRESSIONS  1. Since the last study on 05/01/2020 LVEF has decreased 50-55% to 40-45% with diffuse hypokinesis and akinesis in the basal and mid inferior and inferolateral walls.  2. Left ventricular ejection fraction, by  estimation, is 40 to 45%. The left ventricle has mildly decreased function. The left ventricle demonstrates regional wall motion abnormalities (see scoring diagram/findings for description). There is mild concentric left ventricular hypertrophy. Left ventricular diastolic parameters are consistent with Grade I diastolic dysfunction (impaired relaxation).  3. Right ventricular systolic function is normal. The right ventricular size is normal. There is normal pulmonary artery systolic pressure.  4. Left atrial size was mildly dilated.  5. The mitral valve is normal in structure. Mild mitral valve regurgitation. No evidence of mitral stenosis.  6. The aortic valve is normal in structure. Aortic valve regurgitation is not visualized. No aortic stenosis is present.  7. The inferior vena cava is normal in size with greater than 50% respiratory variability, suggesting right atrial pressure of 3 mmHg. FINDINGS  Left Ventricle: Left ventricular ejection fraction, by estimation, is 40 to 45%. The left ventricle has mildly decreased function. The left ventricle demonstrates regional wall motion abnormalities. The left ventricular internal cavity size was normal in size. There is mild concentric left ventricular hypertrophy. Left ventricular diastolic parameters are consistent with Grade I diastolic dysfunction (impaired relaxation). Normal left ventricular filling pressure.  LV Wall Scoring: The inferior wall, posterior wall, and basal inferoseptal segment are akinetic. Right Ventricle: The right ventricular size is normal. No increase in right ventricular wall thickness. Right ventricular systolic function is normal. There is normal pulmonary artery systolic pressure. The tricuspid regurgitant velocity is 2.33 m/s, and  with an assumed right atrial pressure of 8 mmHg, the estimated right ventricular systolic pressure is 68.3 mmHg. Left Atrium: Left atrial size was mildly dilated. Right Atrium: Right atrial size was normal in  size. Pericardium: There is no evidence of pericardial effusion. Mitral Valve: The mitral valve is normal in structure. Mild mitral valve regurgitation. No evidence of mitral valve stenosis. Tricuspid Valve: The tricuspid valve is normal in structure. Tricuspid valve regurgitation is not demonstrated. No evidence of tricuspid stenosis. Aortic Valve: The aortic valve is normal in structure. Aortic valve regurgitation is not visualized. No aortic stenosis is present. Pulmonic Valve: The pulmonic valve was normal in structure. Pulmonic valve regurgitation is not visualized. No evidence of pulmonic stenosis. Aorta: The aortic root is normal in size and structure. Venous: The inferior vena cava is normal in size with greater than 50% respiratory variability, suggesting right atrial pressure of 3 mmHg. IAS/Shunts: No atrial level shunt detected by color flow Doppler.  LEFT VENTRICLE PLAX 2D LVIDd:         4.70 cm LVIDs:         2.90 cm LV PW:         1.20 cm LV IVS:        1.10 cm LVOT diam:     2.20 cm LV SV:         70 LV SV Index:   37 LVOT Area:     3.80 cm  RIGHT VENTRICLE RV Basal diam:  1.90 cm TAPSE (M-mode): 1.9 cm LEFT ATRIUM             Index       RIGHT ATRIUM  Index LA diam:        3.90 cm 2.08 cm/m  RA Area:     11.90 cm LA Vol (A2C):   82.1 ml 43.87 ml/m RA Volume:   24.00 ml  12.83 ml/m LA Vol (A4C):   57.7 ml 30.84 ml/m LA Biplane Vol: 69.9 ml 37.35 ml/m  AORTIC VALVE LVOT Vmax:   100.25 cm/s LVOT Vmean:  61.700 cm/s LVOT VTI:    0.184 m  AORTA Ao Root diam: 3.60 cm Ao Asc diam:  3.10 cm MITRAL VALVE               TRICUSPID VALVE MV Area (PHT): 2.32 cm    TR Peak grad:   21.7 mmHg MV Decel Time: 327 msec    TR Vmax:        233.00 cm/s MV E velocity: 61.60 cm/s MV See Beharry velocity: 81.90 cm/s  SHUNTS MV E/Luman Holway ratio:  0.75        Systemic VTI:  0.18 m                            Systemic Diam: 2.20 cm Ena Dawley MD Electronically signed by Ena Dawley MD Signature Date/Time:  07/08/2020/9:03:41 AM    Final         Scheduled Meds: . bisacodyl  10 mg Rectal BID  . Chlorhexidine Gluconate Cloth  6 each Topical Daily  . diclofenac Sodium  2 g Topical QID  . feeding supplement  237 mL Oral BID BM  . insulin aspart  0-9 Units Subcutaneous Q4H  . lidocaine  1 patch Transdermal Q24H  . pantoprazole (PROTONIX) IV  40 mg Intravenous Q24H   Continuous Infusions: . dextrose 5% lactated ringers with KCl 20 mEq/L 50 mL/hr at 07/07/20 1849  . diltiazem (CARDIZEM) infusion 5 mg/hr (07/07/20 2036)     LOS: 3 days    Time spent: over 32 min    Fayrene Helper, MD [facility]   To contact the attending provider between 7A-7P or the covering provider during after hours 7P-7A, please log into the web site [email or web address] and access using universal [place] password for that web site. If you do not have the password, please call the hospital operator.  07/08/2020, 11:22 AM

## 2020-07-08 NOTE — Progress Notes (Addendum)
Paged hospitalist @ 0530:  [place]. blood transfusion is complete, pt is now crackling. IV Lasix or CXR?  thanks Abby.

## 2020-07-08 NOTE — Consult Note (Addendum)
Cardiology Consultation:   Patient ID: CLARION MOONEYHAN; [id number]; [birth year]   Admit date: 07/04/2020 Date of Consult: 07/08/2020  Primary Care Provider: [name], MD Primary Cardiologist: Peter Martinique, MD Primary Electrophysiologist:  None  Patient Profile:   NACHMAN SUNDT is a 86 y.o. male with a PMH of CAD s/p PCI/DES to LCx in 2017, , paroxysmal atrial fibrillation, HTN c/b orthostatic hypotension, HLD, DM type 2, CVA, metastatic prostate cancer,  who is being seen today for the evaluation of an abnormal echocardiogram at the request of Dr. Florene Glen.  History of Present Illness:   Mr. Grape presented to the ED with complaints of abdominal pain and distention for the past 2 days with last BM 24 hours prior. CT A/P in the ED was concerning for colonic ileus and he was admitted to medicine with GI following. A rectal tube was placed for decompression without significant results, though ultimately had success with rectal stimulation resulting in flatus and loose stools. His hospital course has been complicated by atrial fibrillation with RVR, currently on a diltiazem gtt. His home xarelto was held and he was initially placed on a heparin gtt in case interventions were needed for management of his ileus. Unfortunately he had some hematuria, therefore anticoagulation was held. Hgb downtrended to 6.9 and he received 1 uPRBC with improvement in Hgb to 8.5. He also noted some chest pain on admission. HsTrop peaked at 112 and trended down, with low flat trend. Echo this admission showed EF 40-45%, RWMA with inferior/posterior/basal inferoseptal wall akinesis, G1DD, mild concentric LVH, mild LAE, and mild MR. Cardiology asked to evaluate for abnormal echocardiogram in patient with recent chest pain.  He was last evaluated by cardiology at an outpatient visit with Dr. Martinique 12/2019, at which time he was doing well from a cardiac standpoint. No medication changes occurred and he was  recommended to follow-up in 6 months. Since that time he has had several hospitalizations. He was admitted 04/2020 with an acute CVA c/b HCAP. Subsequently admitted 05/2020 for generalized weakness and orthostatic hypotension. More recently he was admitted 06/18/20-06/24/20, again for orthostatic hypotension at which time his metoprolol was de-escalated and his doxazosin and ramipril were discontinued. His last echocardiogram prior to this admission was 04/2020 which showed EF 50-55%, indeterminate LV diastolic function, severe hypokinesis of the inferolateral wall with moderate hypokinesis of the basal-mid inferior wall, mild LAE, mild MR, and negative bubble study. His last ischemic evaluation was a [facility] 02/2016 which showed patent LCx stent with non-obstructive mild RCA and LAD disease.   At the time of this evaluation he reports improvement in his abdominal pain following his GI decompression procedure. Grandson at bedside reports he has been sleeping most of the day. When asked about recent chest pain, he seems to focus on relief of pain following his GI procedure and cannot provide any details about chest pain experienced prior to this admission. He reports breathing is stable but has been coughing a bit. No complaints of palpitations, LE edema, dizziness, lightheadedness, or syncope. He cannot get out of bed without assistance. Grandson reports steady decline over the past several months.     Past Medical History:  Diagnosis Date  . Arthritis    "legs, back" (10/06/2015)  . Atrial fibrillation ([place])    x1  . Atrial fibrillation ([place])   . CAD (coronary artery disease)    nonobstructive  . Cancer ([place])    skin cancer on ear (froze it off) and back (cut it  off)  . Dysrhythmia   . Esophageal reflux    hx of  . History of kidney stones   . Hx of heart artery stent   . Hypertrophy of prostate without urinary obstruction and other lower urinary tract symptoms (LUTS)   . Osteoarthrosis, unspecified  whether generalized or localized, unspecified site   . Prostate cancer ([place])   . Skin cancer   . Stroke [facility]) 03/2014   "had a series of mini strokes; maybe 4"; denies residual on 10/06/2015  . Thrombocytopenia ([place])   . Type II diabetes mellitus ([place])    type 2  . Unspecified essential hypertension     Past Surgical History:  Procedure Laterality Date  . BACK SURGERY     2001  . CARDIAC CATHETERIZATION  12/2007  . CARDIAC CATHETERIZATION N/A 09/22/2015   Procedure: Left Heart Cath and Coronary Angiography;  Surgeon: Peter M Martinique, MD;  Location: [place] [facility];  Service: Cardiovascular;  Laterality: N/A;  . CARDIAC CATHETERIZATION N/A 10/06/2015   Procedure: Coronary Stent Intervention;  Surgeon: Peter M Martinique, MD;  Location: [place] [facility];  Service: Cardiovascular;  Laterality: N/A;  . CARDIAC CATHETERIZATION N/A 02/29/2016   Procedure: Left Heart Cath and Coronary Angiography;  Surgeon: Jettie Booze, MD;  Location: [place] [facility];  Service: Cardiovascular;  Laterality: N/A;  . COLONOSCOPY W/ BIOPSIES AND POLYPECTOMY    . CORONARY STENT PLACEMENT  10/06/2015   [name]  with DES  . EAR CYST EXCISION N/A 04/06/2015   Procedure: EXCISION OF SCALP CYST;  Surgeon: Donnie Mesa, MD;  Location: [place];  Service: General;  Laterality: N/A;  . ESOPHAGOGASTRODUODENOSCOPY (EGD) WITH ESOPHAGEAL DILATION  2001   with dilation  . LUMBAR DISC SURGERY  09/26/1999   "cleaned out arthritis and bone spurs"  . SHOULDER ARTHROSCOPY W/ ROTATOR CUFF REPAIR Left 2005  . THULIUM LASER TURP (TRANSURETHRAL RESECTION OF PROSTATE) N/A 01/21/2017   Procedure: Marcelino Duster LASER TURP (TRANSURETHRAL RESECTION OF PROSTATE) CAUTERIZATION OF BLADDER LESION;  Surgeon: Franchot Gallo, MD;  Location: [facility];  Service: Urology;  Laterality: N/A;     Home Medications:  Prior to Admission medications   Medication Sig Start Date End Date Taking? Authorizing Provider  acetaminophen (TYLENOL) 325 MG tablet Take  650 mg by mouth every 12 (twelve) hours as needed for mild pain or moderate pain.   Yes [provider]  Amino Acids-Protein Hydrolys (FEEDING SUPPLEMENT, PRO-STAT SUGAR FREE 64,) LIQD Take 30 mLs by mouth in the morning and at bedtime.   Yes [provider]  Ascorbic Acid (VITAMIN C) 1000 MG tablet Take 1,000 mg by mouth daily.   Yes [provider]  atorvastatin (LIPITOR) 20 MG tablet Take 20 mg by mouth at bedtime.    Yes [provider]  B Complex Vitamins (VITAMIN B COMPLEX PO) Take 1 tablet by mouth daily.   Yes [provider]  calcium carbonate (CALCIUM 600) 1500 (600 Ca) MG TABS tablet Take 600 mg of elemental calcium by mouth daily.    Yes [provider]  Cholecalciferol (VITAMIN D3) 1.25 MG (50000 UT) CAPS Take 50,000 Units by mouth once a week. Saturday   Yes [provider]  cyanocobalamin 1000 MCG tablet Take 1,000 mg by mouth at bedtime.    Yes [provider]  docusate sodium (COLACE) 100 MG capsule Take 200 mg by mouth 2 (two) times daily as needed for mild constipation.   Yes [provider]  enzalutamide Gillermina Phy)  80 MG tablet Take 80 mg by mouth daily.   Yes [provider]  furosemide (LASIX) 40 MG tablet Take 40 mg by mouth daily as needed.   Yes [provider]  glimepiride (AMARYL) 2 MG tablet Take 1 tablet (2 mg total) by mouth every morning. Patient taking differently: Take 2 mg by mouth daily.  06/13/20 06/13/21 Yes Georgette Shell, MD  metoprolol tartrate (LOPRESSOR) 25 MG tablet Take 0.5 tablets (12.5 mg total) by mouth 2 (two) times daily. 06/24/20  Yes Antonieta Pert, MD  Multiple Vitamin (MULTIVITAMIN WITH MINERALS) TABS tablet Take 1 tablet by mouth daily.    Yes [provider]  nitroGLYCERIN (NITROSTAT) 0.4 MG SL tablet Place 1 tablet (0.4 mg total) under the tongue every 5 (five) minutes as needed for chest pain. Patient taking differently: Place 0.4 mg under  the tongue every 5 (five) minutes as needed for chest pain. If no relief, call MD 05/15/18  Yes [name], MD  polyethylene glycol (MIRALAX / GLYCOLAX) 17 g packet Take 17 g by mouth daily.   Yes [provider]  rivaroxaban (XARELTO) 20 MG TABS tablet Take 1 tablet (20 mg total) by mouth daily with supper. 01/21/20  Yes Martinique, Peter M, MD  sodium chloride 1 g tablet Take 1 g by mouth 2 (two) times daily with a meal.   Yes [provider]  traMADol (ULTRAM) 50 MG tablet Take 1 tablet (50 mg total) by mouth 3 (three) times daily as needed. Patient taking differently: Take 50 mg by mouth 3 (three) times daily as needed for moderate pain.  06/13/20  Yes Georgette Shell, MD  BAYER MICROLET LANCETS lancets Use to test blood sugar once daily dx: E11.40 10/18/15   [name], MD  Blood Glucose Monitoring Suppl (CONTOUR NEXT EZ MONITOR) w/Device KIT USE TO TEST BLOOD SUGAR ONCE DAILY. Dx Code E11.40 06/13/16   Viviana Simpler I, MD  glucose blood (CONTOUR NEXT TEST) test strip Check blood sugar once daily and as directed.Dx Code E11.40 03/19/19   [name], MD    Inpatient Medications: Scheduled Meds: . bisacodyl  10 mg Rectal BID  . Chlorhexidine Gluconate Cloth  6 each Topical Daily  . diclofenac Sodium  2 g Topical QID  . doxycycline  100 mg Oral Q12H  . feeding supplement  237 mL Oral BID BM  . insulin aspart  0-9 Units Subcutaneous Q4H  . lidocaine  1 patch Transdermal Q24H  . pantoprazole (PROTONIX) IV  40 mg Intravenous Q24H  . potassium chloride  40 mEq Oral Q4H   Continuous Infusions: . cefTRIAXone (ROCEPHIN)  IV 2 g (07/08/20 1337)  . dextrose 5% lactated ringers with KCl 20 mEq/L 50 mL/hr at 07/07/20 1849  . diltiazem (CARDIZEM) infusion 5 mg/hr (07/07/20 2036)   PRN Meds: acetaminophen **OR** acetaminophen, benzonatate, hydrALAZINE, morphine injection, nitroGLYCERIN  Allergies:    Allergies  Allergen Reactions  . Soma [Carisoprodol] Other  (See Comments)    "did a number on me"  . Doxazosin Other (See Comments)    dizziness    Social History:   Social History   Socioeconomic History  . Marital status: Married    Spouse name: Not on file  . Number of children: 4  . Years of education: Not on file  . Highest education level: Not on file  Occupational History  . Occupation: Radiographer, 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[employer]    Employer: RETIRED  Tobacco Use  . Smoking  status: Former Smoker    Years: 3.00    Types: Cigarettes  . Smokeless tobacco: Never Used  . Tobacco comment: " Quit smoking by age 80; was a someday smoker "  Vaping Use  . Vaping Use: Never used  Substance and Sexual Activity  . Alcohol use: No  . Drug use: No  . Sexual activity: Never  Other Topics Concern  . Not on file  Social History Narrative   No living will   Plans wife and then children to make health care decisions for him if unable   Would request at least attempts at resuscitation but no prolonged life support   Doesn't think he would want tube feeds if cognitively unaware   Social Determinants of Health   Financial Resource Strain:   . Difficulty of Paying Living Expenses: Not on file  Food Insecurity:   . Worried About Charity fundraiser in the Last Year: Not on file  . Ran Out of Food in the Last Year: Not on file  Transportation Needs:   . Lack of Transportation (Medical): Not on file  . Lack of Transportation (Non-Medical): Not on file  Physical Activity:   . Days of Exercise per Week: Not on file  . Minutes of Exercise per Session: Not on file  Stress:   . Feeling of Stress : Not on file  Social Connections:   . Frequency of Communication with Friends and Family: Not on file  . Frequency of Social Gatherings with Friends and Family: Not on file  . Attends Religious Services: Not on file  . Active Member of Clubs or Organizations: Not on file  . Attends Archivist Meetings: Not on file  . Marital Status: Not on file    Intimate Partner Violence:   . Fear of Current or Ex-Partner: Not on file  . Emotionally Abused: Not on file  . Physically Abused: Not on file  . Sexually Abused: Not on file    Family History:    Family History  Problem Relation Age of Onset  . Stroke Father   . Peripheral vascular disease Father        amputation  . Heart failure Mother        CHF  . Coronary artery disease Mother   . Heart attack Mother 59       Multiple  . COPD Brother   . Heart disease Brother   . Cancer Brother        Bone  . Lung cancer Sister      ROS:  Please see the history of present illness.  ROS  All other ROS reviewed and negative.     Physical Exam/Data:   Vitals:   07/08/20 0906 07/08/20 1229 07/08/20 1236 07/08/20 1238  BP: 100/64 (!) 125/91 136/73 120/71  Pulse: (!) 105 (!) 131 (!) 109 98  Resp:  16    Temp:  98.3 F (36.8 C)    TempSrc:  Oral    SpO2:  97% 97% 97%  Weight:      Height:        Intake/Output Summary (Last 24 hours) at 07/08/2020 1405 Last data filed at 07/08/2020 1107 Gross per 24 hour  Intake 945.4 ml  Output 1725 ml  Net -779.6 ml   Filed Weights   07/04/20 2240 07/05/20 0600 07/06/20 0417  Weight: 65 kg 67 kg 68.5 kg   Body mass index is 21.06 kg/m.  General:  Chronically ill appearing elderly gentleman  laying in bed in no acute distress HEENT: sclera anicteric  Neck: no JVD Vascular: No carotid bruits; distal pulses 2+ bilaterally Cardiac:  normal S1, S2; RRR; no murmurs, rubs, or gallops Lungs: no clear wheezes, rhonchi, or rales on anterior exam; too weak to sit upright Abd: NABS, soft, nontender, no hepatomegaly Ext: 1+ upper extremity edema, no LE edema Musculoskeletal:  No deformities, BUE and BLE strength normal and equal Skin: warm and dry  Neuro:  CNs 2-12 intact, no focal abnormalities noted Psych:  Normal affect   EKG:  The EKG was personally reviewed and demonstrates:  EKG 07/04/20 with atrial fibrillation with RVR with rate 134,  prior inferior infarct, PVC, low voltage, no STE/D. Repeat EKG 07/06/20 with sinus rhythm with rate 80 bpm, prior inferior infarct, low voltage, and no STE/D.  Telemetry:  Telemetry was personally reviewed and demonstrates:  Sinus rhythm since 2:30pm, intermittent afib with intermittent RVR this admission, occasional PVCs  Relevant CV Studies: Echocardiogram 07/07/20: 1. Since the last study on 05/01/2020 LVEF has decreased 50-55% to 40-45%  with diffuse hypokinesis and akinesis in the basal and mid inferior and  inferolateral walls.  2. Left ventricular ejection fraction, by estimation, is 40 to 45%. The  left ventricle has mildly decreased function. The left ventricle  demonstrates regional wall motion abnormalities (see scoring  diagram/findings for description). There is mild  concentric left ventricular hypertrophy. Left ventricular diastolic  parameters are consistent with Grade I diastolic dysfunction (impaired  relaxation).  3. Right ventricular systolic function is normal. The right ventricular  size is normal. There is normal pulmonary artery systolic pressure.  4. Left atrial size was mildly dilated.  5. The mitral valve is normal in structure. Mild mitral valve  regurgitation. No evidence of mitral stenosis.  6. The aortic valve is normal in structure. Aortic valve regurgitation is  not visualized. No aortic stenosis is present.  7. The inferior vena cava is normal in size with greater than 50%  respiratory variability, suggesting right atrial pressure of 3 mmHg.   Echocardiogram 04/2020: 1. Left ventricular ejection fraction, by estimation, is 50 to 55%. The  left ventricle has mildly decreased function. The left ventricle  demonstrates regional wall motion abnormalities (see scoring  diagram/findings for description). Left ventricular  diastolic parameters are indeterminate. There is severe hypokinesis of the  left ventricular, entire inferolateral wall. There is  moderate hypokinesis  of the left ventricular, basal-mid inferior wall.  2. Right ventricular systolic function is normal. The right ventricular  size is normal.  3. Left atrial size was mildly dilated.  4. The mitral valve is normal in structure. Mild mitral valve  regurgitation.  5. The aortic valve is tricuspid. Aortic valve regurgitation is not  visualized. No aortic stenosis is present.  6. Agitated saline contrast bubble study was negative, with no evidence  of any interatrial shunt.   Laboratory Data:  Chemistry Recent Labs  Lab 07/07/20 0406 07/07/20 1358 07/08/20 0606  NA 140 142 143  K 3.3* 3.3* 3.4*  CL 108 108 110  CO2 20* 23 23  GLUCOSE 142* 204* 121*  BUN _0 CREATININE 0.68 0.75 0.73  CALCIUM 7.3* 6.9* 7.1*  GFRNONAA >60 >60 >60  ANIONGAP _1 Recent Labs  Lab 07/06/20 0314 07/07/20 0406 07/08/20 0606  PROT 6.9 6.5 6.3*  ALBUMIN 2.1* 2.0* 1.9*  AST 71* 107* 87*  ALT 34 25 19  ALKPHOS 501* 750* 863*  BILITOT 0.8  0.7 1.6*   Hematology Recent Labs  Lab 07/06/20 0314 07/06/20 1127 07/07/20 0406 07/07/20 1824 07/08/20 0606  WBC 7.6  --  8.3  --  6.7  RBC 3.30*  3.24*  --  3.13*  --  3.47*  HGB 7.6*   < > 7.4* 6.9* 8.5*  HCT 26.7*   < > 24.5* 23.1* 28.0*  MCV 80.9  --  78.3*  --  80.7  MCH 23.0*  --  23.6*  --  24.5*  MCHC 28.5*  --  30.2  --  30.4  RDW 21.8*  --  21.6*  --  20.6*  PLT 216  --  194  --  134*   < > = values in this interval not displayed.   Cardiac EnzymesNo results for input(s): TROPONINI in the last 168 hours. No results for input(s): TROPIPOC in the last 168 hours.  BNPNo results for input(s): BNP, PROBNP in the last 168 hours.  DDimer No results for input(s): DDIMER in the last 168 hours.  Radiology/Studies:  DG Abd 1 View  Result Date: 07/06/2020 CLINICAL DATA:  Abdominal pain EXAM: ABDOMEN - 1 VIEW COMPARISON:  CT abdomen and pelvis 07/05/2020 FINDINGS: Diffuse gaseous distention of colon increased  from previous exam. This includes extensive gaseous distension of the sigmoid loop. No definite gas in rectum or bowel wall thickening identified. Increased stool in RIGHT colon. Small bowel decompressed. Osseous demineralization with degenerative disc/facet disease changes and dextroconvex scoliosis lumbar spine. No urinary tract calcification definitely visualized. Scattered pelvic phleboliths. IMPRESSION: Gaseous distention of colon increased from previous exam, especially sigmoid loop. No evidence of sigmoid volvulus was identified on the prior CT. Findings favor diffuse colonic ileus; consider repeat CT if patient's symptoms persist or worsen. Electronically Signed   By: Lavonia Dana M.D.   On: 07/06/2020 10:36   CT ABDOMEN PELVIS W CONTRAST  Result Date: 07/05/2020 CLINICAL DATA:  Suspected bowel obstruction. No bowel movements in 3 days. EXAM: CT ABDOMEN AND PELVIS WITH CONTRAST TECHNIQUE: Multidetector CT imaging of the abdomen and pelvis was performed using the standard protocol following bolus administration of intravenous contrast. CONTRAST:  168mL OMNIPAQUE IOHEXOL 300 MG/ML  SOLN COMPARISON:  05/01/2020 FINDINGS: Lower chest: Mild interstitial pattern to the lung bases likely representing fibrosis. Minimal bilateral pleural effusions. Moderate esophageal hiatal hernia. Coronary artery calcifications. Hepatobiliary: No focal liver abnormality is seen. No gallstones, gallbladder wall thickening, or biliary dilatation. Pancreas: Pancreas is atrophic. Low-attenuation lesion in the pancreatic head appears stable. Calcification in the pancreatic head likely indicates chronic pancreatitis. Spleen: Normal in size without focal abnormality. Adrenals/Urinary Tract: Adrenal glands are unremarkable. Kidneys are normal, without renal calculi, focal lesion, or hydronephrosis. Bladder is unremarkable. Stomach/Bowel: Stomach and small bowel are decompressed. Moderately distended gas and fluid-filled colon and  rectum. Changes likely to represent ileus. Infectious process would also be possible. No colonic wall thickening to suggest colitis. Appendix is not identified. Vascular/Lymphatic: Aortic atherosclerosis. No enlarged abdominal or pelvic lymph nodes. Reproductive: Prostate is unremarkable. Other: No free air or free fluid in the abdomen. Abdominal wall musculature appears intact. Musculoskeletal: Prominent degenerative changes throughout the spine. Multiple focal and confluent areas of sclerosis in the visualized skeleton consistent with diffuse bone metastasis. IMPRESSION: 1. Moderately distended gas and fluid-filled colon and rectum likely to represent ileus. Infectious process would also be possible. No evidence of small bowel obstruction. 2. Moderate esophageal hiatal hernia. 3. Minimal bilateral pleural effusions. 4. Diffuse bone metastasis. 5. Stable appearance of low-attenuation lesion in  the pancreatic head. 6. Aortic atherosclerosis. 7. Changes of chronic pancreatitis. Aortic Atherosclerosis (ICD10-I70.0). Electronically Signed   By: Lucienne Capers M.D.   On: 07/05/2020 01:22   DG CHEST PORT 1 VIEW  Result Date: 07/08/2020 CLINICAL DATA:  Rales. EXAM: PORTABLE CHEST 1 VIEW COMPARISON:  07/07/2020.  CT 07/05/2020.  Multiple older films. FINDINGS: Heart size is normal. Right lung is clear. Infiltrate/volume loss at the left lung base consistent with pneumonia. No dense consolidation, lobar collapse or visible effusion by radiography. Small amount of pleural fluid visible on CT of 3 days ago. Moderate size hiatal hernia as seen previously. No acute bone finding. IMPRESSION: Persistent mild infiltrate/volume loss at the left lung base consistent with pneumonia. Electronically Signed   By: Nelson Chimes M.D.   On: 07/08/2020 08:30   DG CHEST PORT 1 VIEW  Result Date: 07/07/2020 CLINICAL DATA:  Abdominal pain. EXAM: PORTABLE CHEST 1 VIEW COMPARISON:  July 06, 2020. FINDINGS: The heart size and  mediastinal contours are within normal limits. Both lungs are clear. Stable elevated left hemidiaphragm is noted. No pneumothorax or pleural effusion is noted. The visualized skeletal structures are unremarkable. IMPRESSION: No active disease. Electronically Signed   By: Marijo Conception M.D.   On: 07/07/2020 19:32   DG CHEST PORT 1 VIEW  Result Date: 07/06/2020 CLINICAL DATA:  Chest pain. EXAM: PORTABLE CHEST 1 VIEW COMPARISON:  July 04, 2020 FINDINGS: Low lung volumes are noted. There is no evidence of acute infiltrate, pleural effusion or pneumothorax. The heart size and mediastinal contours are within normal limits. The visualized skeletal structures are unremarkable. IMPRESSION: No active disease. Electronically Signed   By: Virgina Norfolk M.D.   On: 07/06/2020 20:10   DG Chest Port 1 View  Result Date: 07/04/2020 CLINICAL DATA:  Shortness of breath EXAM: PORTABLE CHEST 1 VIEW COMPARISON:  06/18/2020 FINDINGS: The heart size and mediastinal contours are within normal limits. Both lungs are clear. The visualized skeletal structures are unremarkable. IMPRESSION: No active disease. Electronically Signed   By: Inez Catalina M.D.   On: 07/04/2020 23:15   DG Abd Portable 1V  Result Date: 07/07/2020 CLINICAL DATA:  Diffuse abdominal pain, abdominal distension EXAM: PORTABLE ABDOMEN - 1 VIEW COMPARISON:  07/06/2020 FINDINGS: The colon rectum appears diffusely gas-filled with the cecum dilated to approximately 10.1 cm, similar to that noted on prior examination. The findings are suggestive of a colonic ileus. Small amount of stool noted within the cecum. No gross free intraperitoneal gas. Foley catheter balloon overlies the mid pelvis. IMPRESSION: Stable diffuse gaseous distension of the colon suggestive of a colonic ileus. Electronically Signed   By: Fidela Salisbury MD   On: 07/07/2020 09:45   ECHOCARDIOGRAM COMPLETE  Result Date: 07/08/2020    ECHOCARDIOGRAM REPORT   Patient Name:    Cherylynn Ridges Date of Exam: 07/07/2020 Medical Rec #:  [id number]      Height:       71.0 in Accession #:    [id number]     Weight:       151.0 lb Date of Birth:  12/30/1933     BSA:          1.871 m Patient Age:    65 years       BP:           149/79 mmHg Patient Gender: M              HR:  71 bpm. Exam Location:  Inpatient Procedure: 2D Echo, Cardiac Doppler and Color Doppler Indications:    Elevated Troponin  History:        Patient has prior history of Echocardiogram examinations, most                 recent 05/01/2020. CAD, Stroke, Arrythmias:Atrial Fibrillation;                 Risk Factors:Hypertension, Diabetes and GERD. Cancer.  Sonographer:    Jonelle Sidle Dance Referring Phys: [phone] A CALDWELL POWELL JR  Sonographer Comments: Suboptimal subcostal window. IMPRESSIONS  1. Since the last study on 05/01/2020 LVEF has decreased 50-55% to 40-45% with diffuse hypokinesis and akinesis in the basal and mid inferior and inferolateral walls.  2. Left ventricular ejection fraction, by estimation, is 40 to 45%. The left ventricle has mildly decreased function. The left ventricle demonstrates regional wall motion abnormalities (see scoring diagram/findings for description). There is mild concentric left ventricular hypertrophy. Left ventricular diastolic parameters are consistent with Grade I diastolic dysfunction (impaired relaxation).  3. Right ventricular systolic function is normal. The right ventricular size is normal. There is normal pulmonary artery systolic pressure.  4. Left atrial size was mildly dilated.  5. The mitral valve is normal in structure. Mild mitral valve regurgitation. No evidence of mitral stenosis.  6. The aortic valve is normal in structure. Aortic valve regurgitation is not visualized. No aortic stenosis is present.  7. The inferior vena cava is normal in size with greater than 50% respiratory variability, suggesting right atrial pressure of 3 mmHg. FINDINGS  Left Ventricle: Left ventricular  ejection fraction, by estimation, is 40 to 45%. The left ventricle has mildly decreased function. The left ventricle demonstrates regional wall motion abnormalities. The left ventricular internal cavity size was normal in size. There is mild concentric left ventricular hypertrophy. Left ventricular diastolic parameters are consistent with Grade I diastolic dysfunction (impaired relaxation). Normal left ventricular filling pressure.  LV Wall Scoring: The inferior wall, posterior wall, and basal inferoseptal segment are akinetic. Right Ventricle: The right ventricular size is normal. No increase in right ventricular wall thickness. Right ventricular systolic function is normal. There is normal pulmonary artery systolic pressure. The tricuspid regurgitant velocity is 2.33 m/s, and  with an assumed right atrial pressure of 8 mmHg, the estimated right ventricular systolic pressure is 01.0 mmHg. Left Atrium: Left atrial size was mildly dilated. Right Atrium: Right atrial size was normal in size. Pericardium: There is no evidence of pericardial effusion. Mitral Valve: The mitral valve is normal in structure. Mild mitral valve regurgitation. No evidence of mitral valve stenosis. Tricuspid Valve: The tricuspid valve is normal in structure. Tricuspid valve regurgitation is not demonstrated. No evidence of tricuspid stenosis. Aortic Valve: The aortic valve is normal in structure. Aortic valve regurgitation is not visualized. No aortic stenosis is present. Pulmonic Valve: The pulmonic valve was normal in structure. Pulmonic valve regurgitation is not visualized. No evidence of pulmonic stenosis. Aorta: The aortic root is normal in size and structure. Venous: The inferior vena cava is normal in size with greater than 50% respiratory variability, suggesting right atrial pressure of 3 mmHg. IAS/Shunts: No atrial level shunt detected by color flow Doppler.  LEFT VENTRICLE PLAX 2D LVIDd:         4.70 cm LVIDs:         2.90 cm LV PW:          1.20 cm LV IVS:  1.10 cm LVOT diam:     2.20 cm LV SV:         70 LV SV Index:   37 LVOT Area:     3.80 cm  RIGHT VENTRICLE RV Basal diam:  1.90 cm TAPSE (M-mode): 1.9 cm LEFT ATRIUM             Index       RIGHT ATRIUM           Index LA diam:        3.90 cm 2.08 cm/m  RA Area:     11.90 cm LA Vol (A2C):   82.1 ml 43.87 ml/m RA Volume:   24.00 ml  12.83 ml/m LA Vol (A4C):   57.7 ml 30.84 ml/m LA Biplane Vol: 69.9 ml 37.35 ml/m  AORTIC VALVE LVOT Vmax:   100.25 cm/s LVOT Vmean:  61.700 cm/s LVOT VTI:    0.184 m  AORTA Ao Root diam: 3.60 cm Ao Asc diam:  3.10 cm MITRAL VALVE               TRICUSPID VALVE MV Area (PHT): 2.32 cm    TR Peak grad:   21.7 mmHg MV Decel Time: 327 msec    TR Vmax:        233.00 cm/s MV E velocity: 61.60 cm/s MV A velocity: 81.90 cm/s  SHUNTS MV E/A ratio:  0.75        Systemic VTI:  0.18 m                            Systemic Diam: 2.20 cm Ena Dawley MD Electronically signed by Ena Dawley MD Signature Date/Time: 07/08/2020/9:03:41 AM    Final     Assessment and Plan:   1. Chest pain in patient with known CAD s/p PCI/DES to LCx in 2017: patient reports intermittent chest pain though no clear exertional angina. He reports chest pain resolved with his GI procedure this hospital stay. EKG is non-ischemic. HsTrop peaked at 112 and trended down - low flat trend not c/w ACS; suspect demand ischemia in the setting of ileus. Echo this admission showed from in EF to 40-45% from 50-55% 04/2020 with chronic RWMA. Studies reviewed with Dr. Sallyanne Kuster who felt there was no significant difference between the two studies and RWMA date back to 2017. Possible there has been a change in his coronary artery anatomy since 2017, though in light of recent anemia requiring blood transfusion, do not feel he is a good candidate for invasive ischemic evaluation at this time as he would not tolerate triple therapy if a stent was warranted. Would focus on medical management. - Continue  statin once clear he is tolerating po - Continue BBlocker - Will hold off on nitrates given lack of clear anginal complaints and issues with orthostatic hypotension in the past.  2. Chronic combined CHF: Echo this admission with EF down to 40-45% from 50-55% 04/2020.Studies reviewed with Dr. Sallyanne Kuster who felt there was no significant difference between the two studies and RWMA date back to 2017.   He received 1 uPRBC this morning due to acute on chronic anemia, given IV lasix 2m post-transfusion. Home metoprolol held on admission in favor of dilt gtt for Afib with RVR management. UOP is net +2.5L this admission. Weight trend 143lbs>147lbs>151lbs.  - Will start metoprolol succinate 22mdaily - Continue to monitor volume status closely - strict I&Os and daily weights.  - Continue to  monitor electrolytes and replete to maintain K >4, Mg >2  3. Paroxysmal atrial fibrillation: was in Afib with RVR on admission. Converted to sinus on diltiazem gtt, though ongoing intermittent episodes. Likely driven by underlying illness (ileus and now PNA).  Home metoprolol tartrate 12.65m BID has been held. Home xarelto on hold in case procedure needed. Was on heparin gtt but developed hematuria. Now with anemia requiring blood transfusion. CHA2DS2-VASc Score 8 (CHF, HTN, DM, Vascular, Age >75, and stroke). No further hematuria today. That being said, with fall history, ongoing anemia, and poor functional status, would be reasonable to discontinue his anticoagulation  - Continue to hold anticoagulation - further recommendations pending ongoing GPrincetondiscussion - Favor stropping diltiazem gtt and transition to metoprolol succinate 247mdaily for rate control now that he is tolerating po.   4. HTN: BP stable  - Continue metoprolol as above  5. HLD: LDL was 11 in 2019. Home atorvastatin on hold pending improvement in ileus - Will check FLP in AM - Resume home atorvastatin when tolerating po well.  6. DM type 2: No A1C  on file - A1C ordered for AM - Continue management per primary team  7. Colonic ileus: GI following - s/p decompression, now with flatus/BM. Currently on CLD - Continue management per primary team and GI  8. HCAP: CXR this morning showed persistent mild infiltrate at the LLL c/w PNA. He was started on CTX + po doxycycline - Continue management per primary team  9. Hypokalemia: ongoing issue this admission despite aggressive repletion - Continue to monitor and replete as needed to maintain K >4  10. Metastatic prostate cancer: evidence of bone metastasis. On hormone therapy. - Continue management per oncology outpatient  11. Mitral regurgitation: mild MR on echo this admission - Can continue routine outpatient monitoring    Would consider palliative care consult for ongoing goals of care discussions. They have met with him on prior admissions and strongly encouraged DNR status and consideration for hospice care, though patient/family requested full code. He has continued to decline over the past several months with frequent hospitalizations. Would be reasonable to readdress at this time.    For questions or updates, please contact [facility] consult [email or web address] for contact info under Cardiology/STEMI.   Signed, KrAbigail ButtsPA-C  07/08/2020 2:05 PM [phone] have seen and examined the patient along with KrAbigail ButtsPA-C .  I have reviewed the chart, notes and new data.  I agree with PA/NP's note.  Key new complaints: unaware of arrhythmia. Very poor functional status. Denies angina. Key examination changes: no overt hypervolemia on exam, although net +ve fluid balance. Appears chronically ill, weak and frail, malnourished. Key new findings / data: ECG shows inferior Q waves and tall R wave V1-V2 c/w old inferoposterior infarction, unchanged for last several years; the echo from this admission shows no tree change in wall motion or overall systolic  function. EF is 45% on both studies. No evidence of fluid overload on the echo. Has clear evidence of paroxysmal atrial fibrillation on this admission, currently in SR. Albumin 2.0. Elevated alk phos c/w bony mets.  PLAN: No evidence of active coronary insufficiency or true change in LV function. Evaluation for CAD progression is not indicated. Although the risk of embolic events/CVA is high in the long term, his overall prognosis is poor. He has required transfusion for anemia. Anticoagulation will be of doubtful overall benefit and will likely cause more harm than benefit. Prefer beta  blockers for rate control. Can stop diltiazem. Would focus on quality of life interventions, palliative care.  Sanda Klein, MD, [place] [phone] 07/08/2020, 10:49 PM

## 2020-07-09 ENCOUNTER — Inpatient Hospital Stay (HOSPITAL_COMMUNITY): Payer: Medicare Other

## 2020-07-09 DIAGNOSIS — K567 Ileus, unspecified: Secondary | ICD-10-CM | POA: Diagnosis not present

## 2020-07-09 DIAGNOSIS — R778 Other specified abnormalities of plasma proteins: Secondary | ICD-10-CM | POA: Diagnosis not present

## 2020-07-09 DIAGNOSIS — I2510 Atherosclerotic heart disease of native coronary artery without angina pectoris: Secondary | ICD-10-CM | POA: Diagnosis not present

## 2020-07-09 DIAGNOSIS — Z8673 Personal history of transient ischemic attack (TIA), and cerebral infarction without residual deficits: Secondary | ICD-10-CM | POA: Diagnosis not present

## 2020-07-09 DIAGNOSIS — I10 Essential (primary) hypertension: Secondary | ICD-10-CM | POA: Diagnosis not present

## 2020-07-09 DIAGNOSIS — I480 Paroxysmal atrial fibrillation: Secondary | ICD-10-CM

## 2020-07-09 LAB — CBC WITH DIFFERENTIAL/PLATELET
Abs Immature Granulocytes: 0.24 10*3/uL — ABNORMAL HIGH (ref 0.00–0.07)
Band Neutrophils: 0 %
Basophils Absolute: 0 10*3/uL (ref 0.0–0.1)
Basophils Relative: 0 %
Blasts: 0 %
Eosinophils Absolute: 0.1 10*3/uL (ref 0.0–0.5)
Eosinophils Relative: 1 %
HCT: 27.2 % — ABNORMAL LOW (ref 39.0–52.0)
Hemoglobin: 8.5 g/dL — ABNORMAL LOW (ref 13.0–17.0)
Lymphocytes Relative: 15 %
Lymphs Abs: 0.9 10*3/uL (ref 0.7–4.0)
MCH: 25 pg — ABNORMAL LOW (ref 26.0–34.0)
MCHC: 31.3 g/dL (ref 30.0–36.0)
MCV: 80 fL (ref 80.0–100.0)
Metamyelocytes Relative: 2 %
Monocytes Absolute: 0.6 10*3/uL (ref 0.1–1.0)
Monocytes Relative: 10 %
Myelocytes: 2 %
Neutro Abs: 4.1 10*3/uL (ref 1.7–7.7)
Neutrophils Relative %: 70 %
Other: 0 %
Platelets: 131 10*3/uL — ABNORMAL LOW (ref 150–400)
Promyelocytes Relative: 0 %
RBC: 3.4 MIL/uL — ABNORMAL LOW (ref 4.22–5.81)
RDW: 21.3 % — ABNORMAL HIGH (ref 11.5–15.5)
WBC: 5.9 10*3/uL (ref 4.0–10.5)
nRBC: 0 /100 WBC
nRBC: 0.8 % — ABNORMAL HIGH (ref 0.0–0.2)

## 2020-07-09 LAB — COMPREHENSIVE METABOLIC PANEL
ALT: 16 U/L (ref 0–44)
AST: 58 U/L — ABNORMAL HIGH (ref 15–41)
Albumin: 1.9 g/dL — ABNORMAL LOW (ref 3.5–5.0)
Alkaline Phosphatase: 745 U/L — ABNORMAL HIGH (ref 38–126)
Anion gap: 9 (ref 5–15)
BUN: 15 mg/dL (ref 8–23)
CO2: 24 mmol/L (ref 22–32)
Calcium: 7.5 mg/dL — ABNORMAL LOW (ref 8.9–10.3)
Chloride: 111 mmol/L (ref 98–111)
Creatinine, Ser: 0.63 mg/dL (ref 0.61–1.24)
GFR, Estimated: >60 mL/min (ref >60)
Glucose, Bld: 85 mg/dL (ref 70–99)
Potassium: 3 mmol/L — ABNORMAL LOW (ref 3.5–5.1)
Sodium: 144 mmol/L (ref 135–145)
Total Bilirubin: 0.6 mg/dL (ref 0.3–1.2)
Total Protein: 6.4 g/dL — ABNORMAL LOW (ref 6.5–8.1)

## 2020-07-09 LAB — TYPE AND SCREEN
ABO/RH(D): B NEG
Antibody Screen: NEGATIVE
Unit division: 0

## 2020-07-09 LAB — BPAM RBC
Blood Product Expiration Date: 202110312359
ISSUE DATE / TIME: 202110220051
Unit Type and Rh: 1700

## 2020-07-09 LAB — GLUCOSE, CAPILLARY
Glucose-Capillary: 107 mg/dL — ABNORMAL HIGH (ref 70–99)
Glucose-Capillary: 160 mg/dL — ABNORMAL HIGH (ref 70–99)
Glucose-Capillary: 178 mg/dL — ABNORMAL HIGH (ref 70–99)
Glucose-Capillary: 178 mg/dL — ABNORMAL HIGH (ref 70–99)
Glucose-Capillary: 191 mg/dL — ABNORMAL HIGH (ref 70–99)
Glucose-Capillary: 77 mg/dL (ref 70–99)

## 2020-07-09 LAB — LIPID PANEL
Cholesterol: 67 mg/dL (ref 0–200)
HDL: 23 mg/dL — ABNORMAL LOW (ref >40)
LDL Cholesterol: 31 mg/dL (ref 0–99)
Total CHOL/HDL Ratio: 2.9 RATIO
Triglycerides: 65 mg/dL (ref <150)
VLDL: 13 mg/dL (ref 0–40)

## 2020-07-09 LAB — MAGNESIUM: Magnesium: 1.9 mg/dL (ref 1.7–2.4)

## 2020-07-09 LAB — PHOSPHORUS: Phosphorus: 2 mg/dL — ABNORMAL LOW (ref 2.5–4.6)

## 2020-07-09 LAB — HIV ANTIBODY (ROUTINE TESTING W REFLEX): HIV Screen 4th Generation wRfx: NONREACTIVE

## 2020-07-09 MED ORDER — POTASSIUM CHLORIDE CRYS ER 20 MEQ PO TBCR
40.0000 meq | EXTENDED_RELEASE_TABLET | ORAL | Status: AC
  Administered 2020-07-09 (×2): 40 meq via ORAL
  Filled 2020-07-09 (×2): qty 2

## 2020-07-09 MED ORDER — DIGOXIN 0.25 MG/ML IJ SOLN
0.1250 mg | Freq: Once | INTRAMUSCULAR | Status: AC
  Administered 2020-07-09: 0.125 mg via INTRAVENOUS
  Filled 2020-07-09: qty 2

## 2020-07-09 MED ORDER — METOPROLOL TARTRATE 25 MG PO TABS
12.5000 mg | ORAL_TABLET | Freq: Four times a day (QID) | ORAL | Status: DC
  Administered 2020-07-09 – 2020-07-10 (×3): 12.5 mg via ORAL
  Filled 2020-07-09 (×3): qty 1

## 2020-07-09 MED ORDER — METOPROLOL TARTRATE 5 MG/5ML IV SOLN
5.0000 mg | Freq: Once | INTRAVENOUS | Status: AC | PRN
  Administered 2020-07-09: 5 mg via INTRAVENOUS
  Filled 2020-07-09: qty 5

## 2020-07-09 MED ORDER — TRAMADOL HCL 50 MG PO TABS
50.0000 mg | ORAL_TABLET | Freq: Once | ORAL | Status: AC
  Administered 2020-07-10: 50 mg via ORAL
  Filled 2020-07-09 (×2): qty 1

## 2020-07-09 MED ORDER — METOPROLOL TARTRATE 5 MG/5ML IV SOLN
2.5000 mg | INTRAVENOUS | Status: AC | PRN
  Administered 2020-07-09 (×3): 2.5 mg via INTRAVENOUS
  Filled 2020-07-09 (×3): qty 5

## 2020-07-09 NOTE — Progress Notes (Signed)
   07/09/20 1550  Vitals  Temp 98.3 F (36.8 C)  Temp Source Oral  BP 134/70  MAP (mmHg) 80  BP Location Left Arm  BP Method Automatic  Patient Position (if appropriate) Lying  Pulse Rate (!) 135  Pulse Rate Source Monitor  MEWS COLOR  MEWS Score Color Yellow  Oxygen Therapy  SpO2 98 %  O2 Device Room Air  MEWS Score  MEWS Temp 0  MEWS Systolic 0  MEWS Pulse 3  MEWS RR 0  MEWS LOC 0  MEWS Score 3  MD notified. PRN metoprolol ordered and given

## 2020-07-09 NOTE — Evaluation (Signed)
Physical Therapy Evaluation Patient Details Name: Adrian Singh MRN: [id number] DOB: May 09, 1934 Today's Date: 07/09/2020   History of Present Illness  86 y.o. WM PMHx CAD status post stenting, A. 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 Pt now admitted with Ileus.  Clinical Impression  Pt admitted with above diagnosis. Max assist of 2 for rolling and for sidelying to sit, pt sat at edge of bed ~4 minutes where he reported dizziness. BP supine 151/72, sitting 133/70. Pt fatigued very quickly with minimal activity. SNF recommended.  Pt currently with functional limitations due to the deficits listed below (see PT Problem List). Pt will benefit from skilled PT to increase their independence and safety with mobility to allow discharge to the venue listed below.       Follow Up Recommendations SNF;Supervision/Assistance - 24 hour    Equipment Recommendations  None recommended by PT    Recommendations for Other Services       Precautions / Restrictions Precautions Precautions: Fall Precaution Comments: Hx of orthostatic hypotension, Loose BMs Restrictions Weight Bearing Restrictions: No      Mobility  Bed Mobility Overal bed mobility: Needs Assistance Bed Mobility: Sidelying to Sit;Sit to Sidelying;Rolling Rolling: Max assist Sidelying to sit: Max assist;+2 for physical assistance;+2 for safety/equipment     Sit to sidelying: Max assist;+2 for physical assistance;+2 for safety/equipment General bed mobility comments: assist to complete roll, bring LEs over EOB and bring trunk to upright, pt  reported dizziness in sitting, BP 133/70 sitting, 151/72 lying, pt sat edge of bed for ~4 minutes, tolerance limited by fatigue    Transfers                 General transfer comment: Unable to tolerate standing at this time.  Ambulation/Gait                Stairs            Wheelchair Mobility    Modified Rankin (Stroke Patients Only)       Balance Overall balance assessment: Needs assistance Sitting-balance support: Single extremity supported;Feet supported Sitting balance-Leahy Scale: Poor Sitting balance - Comments: Min assist from therapist to maintain balance.                                     Pertinent Vitals/Pain Pain Assessment: No/denies pain    Home Living Family/patient expects to be discharged to:: [place]: [place] - 2 wheels;Cane - single point;Cane - quad;Bedside commode;Shower seat;Hand held shower head;Wheelchair - manual;Grab bars - toilet Additional Comments: Has had several hospital admissions in the last three months and has been at [facility]. Reports minimal activity since last admission due to stomach pain and weakness. "They picked me up to get to the chair".    Prior Function Level of Independence: Needs assistance   Gait / Transfers Assistance Needed: Needs assistane up to the chair he reports.  ADL's / Homemaking Assistance Needed: Needs assistance with all ADLs.        Hand Dominance  Extremity/Trunk Assessment   Upper Extremity Assessment Upper Extremity Assessment: Defer to OT evaluation RUE Deficits / Details: R shoulder 3/5, elbow 4/5, wrist 4/5, grip 3+/5; RUE Sensation:  (Reports normal sensation.) RUE Coordination:  (grossly functional finger to thumb.) LUE Deficits / Details: L shoulder 3-/5, elbow 3/5, wrist 3-/5, fingers 3-/5 LUE Sensation:  (reports sensation is dull) LUE Coordination:  (Able to oppose thumb to 2nd and 3rd  digit, not 4th and 5th)    Lower Extremity Assessment Lower Extremity Assessment: Generalized weakness (reports decreased sensation to light touch B feet)    Cervical / Trunk Assessment Cervical / Trunk Assessment: Kyphotic  Communication   Communication: Expressive difficulties (low volume)  Cognition Arousal/Alertness: Awake/alert Behavior During Therapy: WFL for tasks assessed/performed Overall Cognitive Status: Within Functional Limits for tasks assessed                                        General Comments General comments (skin integrity, edema, etc.): skin breakdown on mid thoracic spinous process -RN notified; floated heels    Exercises     Assessment/Plan    PT Assessment    PT Problem List Decreased strength;Decreased mobility;Decreased balance;Decreased activity tolerance;Impaired sensation       PT Treatment Interventions DME instruction;Balance training;Therapeutic exercise;Functional mobility training;Therapeutic activities;Patient/family education;Wheelchair mobility training    PT Goals (Current goals can be found in the Care Plan section)  Acute Rehab PT Goals Patient Stated Goal: "whatever you want to work on" PT Goal Formulation: With patient Time For Goal Achievement: 07/23/20 Potential to Achieve Goals: Fair    Frequency Min 2X/week   Barriers to discharge        Co-evaluation PT/OT/SLP Co-Evaluation/Treatment: Yes Reason for Co-Treatment: Complexity of the patient's impairments (multi-system involvement);For patient/therapist safety;To address functional/ADL transfers PT goals addressed during session: Mobility/safety with mobility;Balance OT goals addressed during session: ADL's and self-care;Strengthening/ROM       AM-PAC PT "6 Clicks" Mobility  Outcome Measure Help needed turning from your back to your side while in a flat bed without using bedrails?: Total Help needed moving from lying on your back to sitting on the  side of a flat bed without using bedrails?: Total Help needed moving to and from a bed to a chair (including a wheelchair)?: Total Help needed standing up from a chair using your arms (e.g., wheelchair or bedside chair)?: Total Help needed to walk in hospital room?: Total Help needed climbing 3-5 steps with a railing? : Total 6 Click Score: 6    End of Session   Activity Tolerance: Patient limited by fatigue Patient left: in bed;with call bell/phone within reach;with nursing/sitter in room;with family/visitor present;with bed alarm set Nurse Communication: Mobility status;Need for lift equipment;Other (comment) (skin breakdown back) PT Visit Diagnosis: History of falling (Z91.81);Muscle weakness (generalized) (M62.81);Unsteadiness on feet (R26.81);Difficulty in walking, not elsewhere classified (R26.2)    Time: 5974-1638 PT Time Calculation (min) (ACUTE ONLY): 29 min   Charges:   PT Evaluation $PT Eval Moderate Complexity: 1 Mod         Philomena Doheny PT 07/09/2020  Acute Rehabilitation Services Pager [phone] Office [phone]

## 2020-07-09 NOTE — Progress Notes (Signed)
PROGRESS NOTE    Adrian Singh  ZGY:[id number] DOB: 06/23/1934 DOA: 07/04/2020 PCP: [name], MD  Chief Complaint  Patient presents with  . Abdominal Pain   Brief Narrative:  Adrian Singh Adrian Singh 86 y.o.WM PMHx CAD status post stenting, [place]. 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 Denies any vomiting fever or chills. Abdominal discomfort is generalized. Was able to take his medications.  Assessment & Plan:   Principal Problem:   Ileus ([name]) Active Problems:   Essential hypertension   Uncontrolled type 2 diabetes mellitus with diabetic polyneuropathy, without long-term current use of insulin ([name])   CKD stage 3 due to type 2 diabetes mellitus ([name])   History of CVA (cerebrovascular accident)   CAD S/P percutaneous coronary angioplasty   Prostate cancer (HCC)   Elevated troponin   Pressure injury of skin   Hypokalemia   Atrial fibrillation with RVR (HCC)  Ileus - CT scan consistent with ileus -- KUB 10/21 with stable diffuse gaseous distension -- continued abdominal distension today, though improved -- will consider repeat CT scan if patient with worsening pain/symptoms -- replace mag/K as needed to keep >2/4 respectively -- seems to be improving, follow with full liquid diet -- will discuss with GI, appreciate recommendations (see 10/22 GI note)  Elevated troponin  Chest Pain  Coronary Artery Disease s/p stenting -- c/o  intermittent chest discomfort 10/20, unclear etiology, he c/o diffuse pain at times.  Troponin was elevated, but relatively flat, not c/w ACS and EKG was without changes concerning for ACS. -- echo with EF to 40-45%, diffuse hypokinesis and akinesis in basal and mid inferior and inferolateral walls -- will consult cardiology given decreased EF -> cards recommending atoravastatin, metoprolol succinate 25 mg daily -- will continue to monitor, asymptomatic today  HCAP Start abx with ceftriaxone/doxy for now SLP eval for possible aspiration Follow MRSA and sputum cx if able Repeat CXR 10/23 am with decreased lung volumes with increased atelectasis  Hypokalemia  Hypophosphatemia - replace and follow   Elevated LFT's  Elevated Alk Phos:  - normal bilirubin, GGT wnl -> suspect alk phos 2/2 bone mets - Appreciate Gi assistance, will continue to follow, may need additional imaging  Chronic pain syndrome -Morphine PRN --at home is on tramadol per med rec  Chronic systolic CHF -Strict in and out -Daily weight  --caution with IVF, takes 40 mg lasix daily as need at home --echo with decreased EF 40-45% with diffuse hypokinesis, appreciate cardiology recs  Chronic anemia -labs suggestive of AOCD -- s/p 1 unit pRBC -- stable today - hypoprolieferative retics - continue to trend  Fib with RVR -Cardizem drip - continue while NPO - rate improved -Heparin drip per pharmacy some hematuria noted 10/21, was on hold - per cards, plan to d/c outpatient xarelto   Hematuria - while on heparin gtt, follow UA - hold heparin for now - UA with >50 RBC's, urine appears clear this AM - follow outpatient   NAGMA - improved, continue to monitor  DM type II controlled with complication; DM gastroparesis  Hypoglycemia -10/6 hemoglobin A1c= 6.5 -Sensitive SSI -- follow with D5 containing fluids  Metastatic prostate cancer  Hx CVA with residual left sided weakness and dysarthria Has  abnormal MRI brain from 04/2020 and short term follow up was recommended Will order repeat MRI with contrast  Goals of Care: Will c/s palliative care to assist with [name] conversations.  Discussed this with pt and daughter at bedside today.  DVT prophylaxis: heparin  Code Status: full  Family Communication: none at bedside - daughter Disposition:   Status is: Inpatient  Remains inpatient appropriate because:Inpatient level of care appropriate due to severity of illness   Dispo: The patient is from: Home              Anticipated d/c is to: pending              Anticipated d/c date is: > 3 days              Patient currently is not medically stable to d/c.    Consultants:   GI  Procedures:  none  Antimicrobials:  Anti-infectives (From admission, onward)   Start     Dose/Rate Route Frequency Ordered Stop   07/08/20 1230  cefTRIAXone (ROCEPHIN) 2 g in sodium chloride 0.9 % 100 mL IVPB        2 g 200 mL/hr over 30 Minutes Intravenous Every 24 hours 07/08/20 1141 07/13/20 1229   07/08/20 1230  doxycycline (VIBRA-TABS) tablet 100 mg        100 mg Oral Every 12 hours 07/08/20 1141       Subjective: Comfortable, no new complaints  Objective: Vitals:   07/08/20 2040 07/09/20 0500 07/09/20 0550 07/09/20 1202  BP: 139/68  (!) 160/63 (!) 156/57  Pulse: 85  82 91  Resp: '20  16 17  ' Temp: 99.6 F (37.6 C)  98.1 F (36.7 C) 97.7 F (36.5 C)  TempSrc: Oral  Oral Oral  SpO2: 96%  99% 96%  Weight:  66.6 kg    Height:        Intake/Output Summary (Last 24 hours) at 07/09/2020 1354 Last data filed at 07/09/2020 1300 Gross per 24 hour  Intake 1762.12 ml  Output 350 ml  Net 1412.12 ml   Filed Weights   07/05/20 0600 07/06/20 0417 07/09/20 0500  Weight: 67 kg 68.5 kg 66.6 kg    Examination:  General: No acute distress. Cardiovascular: Heart sounds show Reine Bristow regular rate, and rhythm Lungs: Clear to auscultation bilaterally Abdomen: Soft, nontender, distension significantly  improved Neurological: Alert and oriented 3. Moves all extremities 4. Cranial nerves II through XII grossly intact. Skin: Warm and dry. No rashes or lesions. Extremities: No clubbing or cyanosis. No edema.   Data Reviewed: I have personally reviewed following labs and imaging studies  CBC: Recent Labs  Lab 07/04/20 2237 07/04/20 2237 07/06/20 0314 07/06/20 0314 07/06/20 1127 07/07/20 0406 07/07/20 1824 07/08/20 0606 07/09/20 0508  WBC 6.6  --  7.6  --   --  8.3  --  6.7 5.9  NEUTROABS  --   --  5.0  --   --  5.8  --  5.1 4.1  HGB 9.2*   < > 7.6*   < > 8.1* 7.4* 6.9* 8.5* 8.5*  HCT 30.9*   < > 26.7*   < > 26.5* 24.5* 23.1* 28.0* 27.2*  MCV 79.6*  --  80.9  --   --  78.3*  --  80.7 80.0  PLT 219  --  216  --   --  194  --  134* 131*   < > = values in this interval not displayed.    Basic Metabolic Panel: Recent Labs  Lab 07/05/20 0945 07/05/20 0945 07/06/20 0314 07/07/20 0406 07/07/20 1358 07/08/20 0606 07/09/20 0508  NA 138   < > 138 140 142 143 144  K 3.1*   < > 4.0 3.3* 3.3* 3.4* 3.0*  CL 107   < > 111 108 108 110 111  CO2 17*   < > 16* 20* '23 23 24  ' GLUCOSE 171*   < > 78 142* 204* 121* 85  BUN 39*   < > 38* '23 21 16 15  ' CREATININE 0.99   < > 0.92 0.68 0.75 0.73 0.63  CALCIUM 7.7*   < > 7.7* 7.3* 6.9* 7.1* 7.5*  MG 2.3   < > 2.5* 2.0 2.1 2.1 1.9  PHOS 2.6  --  1.7* 3.0  --  2.6 2.0*   < > = values in this interval not displayed.    GFR: Estimated Creatinine Clearance: 65.9 mL/min (by C-G formula based on SCr of 0.63 mg/dL).  Liver Function Tests: Recent Labs  Lab 07/05/20 0402 07/06/20 0314 07/07/20 0406 07/08/20 0606 07/09/20 0508  AST 73* 71* 107* 87* 58*  ALT 37 34 '25 19 16  ' ALKPHOS 491* 501* 750* 863* 745*  BILITOT 0.8 0.8 0.7 1.6* 0.6  PROT 6.7 6.9 6.5 6.3* 6.4*  ALBUMIN 1.8* 2.1* 2.0* 1.9* 1.9*    CBG: Recent Labs  Lab 07/08/20 2034 07/08/20 2337 07/09/20 0554 07/09/20 0729 07/09/20 1158  GLUCAP 170* 130* 77 107* 160*      Recent Results (from the past 240 hour(s))  Respiratory Panel by RT PCR (Flu Sujey Gundry&B, Covid) - Nasopharyngeal Swab     Status: None   Collection Time: 07/04/20 10:56 PM   Specimen: Nasopharyngeal Swab  Result Value Ref Range Status   SARS Coronavirus 2 by RT PCR NEGATIVE NEGATIVE Final    Comment: (NOTE) SARS-CoV-2 target nucleic acids are NOT DETECTED.  The SARS-CoV-2 RNA is generally detectable in upper respiratoy specimens during the acute phase of infection. The lowest concentration of SARS-CoV-2 viral copies this assay can detect is 131 copies/mL. Yuepheng Schaller negative result does not preclude SARS-Cov-2 infection and should not be used as the sole basis for treatment or other patient management decisions. Mylisa Brunson negative result may occur with  improper specimen collection/handling, submission of specimen other than nasopharyngeal swab, presence of viral mutation(s) within the areas targeted by this assay, and inadequate number of viral copies (<131 copies/mL). Joslyn Ramos negative result must be combined with clinical observations, patient history, and epidemiological information. The expected result is Negative.  Fact Sheet for Patients:  PinkCheek.be  Fact Sheet for Healthcare Providers:  GravelBags.it  This test is no t yet approved or cleared by the [place] FDA and  has been authorized for detection and/or diagnosis of SARS-CoV-2 by FDA under an Emergency Use Authorization (EUA). This EUA will remain  in effect (meaning this test can be used) for the duration of the COVID-19 declaration under Section 564(b)(1) of the Act, 21 U.S.C. section 360bbb-3(b)(1), unless the authorization is terminated or revoked sooner.     Influenza Laytoya Ion by PCR NEGATIVE NEGATIVE Final   Influenza B by PCR NEGATIVE NEGATIVE Final    Comment: (NOTE) The Xpert Xpress SARS-CoV-2/FLU/RSV assay is intended as an aid in  the diagnosis of influenza from  Nasopharyngeal swab specimens and  should  not be used as Zymarion Favorite sole basis for treatment. Nasal washings and  aspirates are unacceptable for Xpert Xpress SARS-CoV-2/FLU/RSV  testing.  Fact Sheet for Patients: PinkCheek.be  Fact Sheet for Healthcare Providers: GravelBags.it  This test is not yet approved or cleared by the [place] FDA and  has been authorized for detection and/or diagnosis of SARS-CoV-2 by  FDA under an Emergency Use Authorization (EUA). This EUA will remain  in effect (meaning this test can be used) for the duration of the  Covid-19 declaration under Section 564(b)(1) of the Act, 21  U.S.C. section 360bbb-3(b)(1), unless the authorization is  terminated or revoked. Performed at [facility], [place] [address], [place], [place] [address]   MRSA PCR Screening     Status: None   Collection Time: 07/05/20  6:08 AM   Specimen: Nasopharyngeal  Result Value Ref Range Status   MRSA by PCR NEGATIVE NEGATIVE Final    Comment:        The GeneXpert MRSA Assay (FDA approved for NASAL specimens only), is one component of Catrinia Racicot comprehensive MRSA colonization surveillance program. It is not intended to diagnose MRSA infection nor to guide or monitor treatment for MRSA infections. Performed at [facility], [place] [address], [place], [place] [address]   MRSA PCR Screening     Status: None   Collection Time: 07/08/20  3:00 PM   Specimen: Nasal Mucosa; Nasopharyngeal  Result Value Ref Range Status   MRSA by PCR NEGATIVE NEGATIVE Final    Comment:        The GeneXpert MRSA Assay (FDA approved for NASAL specimens only), is one component of Cyril Woodmansee comprehensive MRSA colonization surveillance program. It is not intended to diagnose MRSA infection nor to guide or monitor treatment for MRSA infections. Performed at [facility], [place] [address], [place], [place] [address]           Radiology Studies: DG CHEST PORT 1 VIEW  Result Date: 07/09/2020 CLINICAL DATA:  Shortness of breath EXAM: PORTABLE CHEST 1 VIEW COMPARISON:  Yesterday FINDINGS: Borderline heart size accentuated by rotation and Murlin Schrieber moderate hiatal hernia. Indistinct opacity at the bases, greater on the left. No Kerley lines, effusion, or pneumothorax. IMPRESSION: Decreased lung volumes with increased presumed atelectasis. Electronically Signed   By: Monte Fantasia M.D.   On: 07/09/2020 06:56   DG CHEST PORT 1 VIEW  Result Date: 07/08/2020 CLINICAL DATA:  R[place]. EXAM: PORTABLE CHEST 1 VIEW COMPARISON:  07/07/2020.  CT 07/05/2020.  Multiple older films. FINDINGS: Heart size is normal. Right lung is clear. Infiltrate/volume loss at the left lung base consistent with pneumonia. No dense consolidation, lobar collapse or visible effusion by radiography. Small amount of pleural fluid visible on CT of 3 days ago. Moderate size hiatal hernia as seen previously. No acute bone finding. IMPRESSION: Persistent mild infiltrate/volume loss at the left lung base consistent with pneumonia. Electronically Signed   By: Nelson Chimes M.D.   On: 07/08/2020 08:30   DG CHEST PORT 1 VIEW  Result Date: 07/07/2020 CLINICAL DATA:  Abdominal pain. EXAM: PORTABLE CHEST 1 VIEW COMPARISON:  July 06, 2020. FINDINGS: The heart size and mediastinal contours are within normal limits. Both lungs are clear. Stable elevated left hemidiaphragm is noted. No pneumothorax or pleural effusion is noted. The visualized skeletal structures are unremarkable. IMPRESSION: No active disease. Electronically Signed   By: Marijo Conception M.D.   On: 07/07/2020 19:32        Scheduled  Meds: . atorvastatin  20 mg Oral Daily  . bisacodyl  10 mg Rectal BID  . Chlorhexidine Gluconate Cloth  6 each Topical Daily  . diclofenac Sodium  2 g Topical QID  . doxycycline  100 mg Oral Q12H  . feeding supplement  237 mL Oral BID BM  . insulin aspart  0-9  Units Subcutaneous Q4H  . lidocaine  1 patch Transdermal Q24H  . metoprolol succinate  25 mg Oral Daily  . pantoprazole (PROTONIX) IV  40 mg Intravenous Q24H  . potassium chloride  40 mEq Oral Q4H   Continuous Infusions: . cefTRIAXone (ROCEPHIN)  IV 2 g (07/08/20 1337)     LOS: 4 days    Time spent: over 30 min    Fayrene Helper, MD [facility]   To contact the attending provider between 7A-7P or the covering provider during after hours 7P-7A, please log into the web site [email or web address] and access using universal [place] password for that web site. If you do not have the password, please call the hospital operator.  07/09/2020, 1:54 PM

## 2020-07-09 NOTE — Evaluation (Signed)
Occupational Therapy Evaluation Patient Details Name: Adrian Singh MRN: [id number] DOB: 07/15/1934 Today's Date: 07/09/2020    History of Present Illness 86 y.o. WM PMHx CAD status post stenting, A. 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 Pt now admitted with Ileus.   Clinical Impression   [name] is an 86 year old man who presents with significant generalized weakness, LUE hemiparesis, decreased activity tolerance and poor balance resulting in a significant decline in ability to participate in self care tasks and perform functional mobility. Patient max assist for rolling, max x 2 for transfer to edge of bed and predominantly max-total assist with ADLs including grooming and feeding. Patient will benefit from skilled OT services while in hospital in order to improve deficits and learn compensatory strategies as needed in order to improve functional abilities and to reduce caregiver burden.     Follow Up Recommendations  SNF    Equipment Recommendations  None recommended by OT    Recommendations for Other Services       Precautions / Restrictions Precautions Precautions: Fall Precaution Comments: Hx of orthostatic hypotension, Loose BMs Restrictions Weight Bearing Restrictions: No      Mobility Bed Mobility Overal bed mobility: Needs Assistance Bed Mobility: Sidelying to Sit;Sit to Sidelying;Rolling Rolling: Max assist Sidelying to sit: Max assist;+2 for physical assistance;+2 for safety/equipment     Sit to sidelying: Max assist;+2 for physical assistance;+2 for  safety/equipment General bed mobility comments: assist to complete roll, bring LEs over EOB and bring trunk to upright    Transfers                 General transfer comment: Unable to tolerate standing at this time.    Balance Overall balance assessment: Needs assistance Sitting-balance support: Single extremity supported;Feet supported Sitting balance-Leahy Scale: Poor Sitting balance - Comments: Min assist from therapist to maintain balance.                                   ADL either performed or assessed with clinical judgement   ADL Overall ADL's : Needs assistance/impaired Eating/Feeding: Total assistance;Set up;Bed level   Grooming: Sitting;Maximal assistance;Bed level   Upper Body Bathing: Maximal assistance;Bed level   Lower Body Bathing: Total assistance;Bed level   Upper Body Dressing : Total assistance;Bed level   Lower Body Dressing: Total assistance;Bed level Lower Body Dressing Details (indicate cue type and reason): to don socks   Toilet Transfer Details (indicate cue type and reason): unable Toileting- Clothing Manipulation and Hygiene: Total assistance;+2 for physical assistance;Bed level               Vision Patient Visual Report: No change from baseline       Perception     Praxis      Pertinent Vitals/Pain Pain Assessment: No/denies pain     Hand Dominance     Extremity/Trunk Assessment Upper Extremity Assessment Upper Extremity Assessment: RUE deficits/detail;LUE deficits/detail RUE Deficits / Details: R shoulder 3/5, elbow 4/5, wrist 4/5, grip 3+/5; RUE Sensation:  (Reports normal sensation.) RUE Coordination:  (grossly functional finger to thumb.) LUE Deficits / Details: L shoulder 3-/5, elbow  3/5, wrist 3-/5, fingers 3-/5 LUE Sensation:  (reports sensation is dull) LUE Coordination:  (Able to oppose thumb to 2nd and 3rd digit, not 4th and 5th)   Lower Extremity Assessment Lower Extremity Assessment: Defer  to PT evaluation   Cervical / Trunk Assessment Cervical / Trunk Assessment: Normal   Communication     Cognition Arousal/Alertness: Awake/alert Behavior During Therapy: WFL for tasks assessed/performed Overall Cognitive Status: Within Functional Limits for tasks assessed                                     General Comments       Exercises     Shoulder Instructions      Home Living                                          Prior Functioning/Environment                   OT Problem List: Decreased strength;Impaired balance (sitting and/or standing);Decreased activity tolerance;Decreased knowledge of use of DME or AE;Decreased safety awareness;Impaired UE functional use;Pain      OT Treatment/Interventions: Self-care/ADL training;Therapeutic exercise;Neuromuscular education;DME and/or AE instruction;Therapeutic activities;Patient/family education;Balance training    OT Goals(Current goals can be found in the care plan section) Acute Rehab OT Goals Patient Stated Goal: "whatever you want to work on" OT Goal Formulation: With patient Time For Goal Achievement: 07/23/20 Potential to Achieve Goals: Fair  OT Frequency: Min 2X/week   Barriers to D/C:            Co-evaluation PT/OT/SLP Co-Evaluation/Treatment: Yes Reason for Co-Treatment: For patient/therapist safety;To address functional/ADL transfers PT goals addressed during session: Mobility/safety with mobility OT goals addressed during session: ADL's and self-care;Strengthening/ROM      AM-PAC OT "6 Clicks" Daily Activity     Outcome Measure Help from another person eating meals?: A Lot Help from another person taking care of personal grooming?: A Lot Help from another person toileting, which includes using toliet, bedpan, or urinal?: Total Help from another person bathing (including washing, rinsing, drying)?: A Lot Help from another person to put on and taking off regular  upper body clothing?: Total Help from another person to put on and taking off regular lower body clothing?: Total 6 Click Score: 9   End of Session Nurse Communication: Mobility status (coughing with drinking)  Activity Tolerance: Patient limited by fatigue Patient left: in bed;with call bell/phone within reach;with family/visitor present;with bed alarm set  OT Visit Diagnosis: Muscle weakness (generalized) (M62.81)                Time: 2409-7353 OT Time Calculation (min): 31 min Charges:  OT General Charges $OT Visit: 1 Visit OT Evaluation $OT Eval Moderate Complexity: 1 Mod  Joshua Soulier, OTR/L [place]  Office [phone] Pager: [phone]   Lenward Chancellor 07/09/2020, 11:08 AM

## 2020-07-09 NOTE — Progress Notes (Signed)
SLP Cancellation Note  Patient Details Name: Adrian Singh MRN: [id number] DOB: [birth year]   Cancelled treatment:       Reason Eval/Treat Not Completed: Medical issues which prohibited therapy Patient requiring EKG due to atrial flutter. Will attempt next date if schedule permits.  Sonia Baller, MA, CCC-SLP Speech Therapy

## 2020-07-09 NOTE — Progress Notes (Signed)
Progress Note  Patient Name: Adrian Singh Date of Encounter: 07/09/2020  [facility] Cardiologist: Peter Martinique, MD   Subjective   Doing better this morning. Tolerating liquids. No chest pain. Daughter at bedside. Reviewed echo and recommendations for anticoagulation, below.  Inpatient Medications    Scheduled Meds: . atorvastatin  20 mg Oral Daily  . bisacodyl  10 mg Rectal BID  . Chlorhexidine Gluconate Cloth  6 each Topical Daily  . diclofenac Sodium  2 g Topical QID  . doxycycline  100 mg Oral Q12H  . feeding supplement  237 mL Oral BID BM  . insulin aspart  0-9 Units Subcutaneous Q4H  . lidocaine  1 patch Transdermal Q24H  . metoprolol succinate  25 mg Oral Daily  . pantoprazole (PROTONIX) IV  40 mg Intravenous Q24H  . potassium chloride  40 mEq Oral Q4H   Continuous Infusions: . cefTRIAXone (ROCEPHIN)  IV 2 g (07/08/20 1337)   PRN Meds: acetaminophen **OR** acetaminophen, benzonatate, hydrALAZINE, morphine injection, nitroGLYCERIN   Vital Signs    Vitals:   07/08/20 1745 07/08/20 2040 07/09/20 0500 07/09/20 0550  BP: (!) 145/69 139/68  (!) 160/63  Pulse: 79 85  82  Resp:  20  16  Temp:  99.6 F (37.6 C)  98.1 F (36.7 C)  TempSrc:  Oral  Oral  SpO2:  96%  99%  Weight:   66.6 kg   Height:        Intake/Output Summary (Last 24 hours) at 07/09/2020 1135 Last data filed at 07/09/2020 0600 Gross per 24 hour  Intake 1162.12 ml  Output 350 ml  Net 812.12 ml   Last 3 Weights 07/09/2020 07/06/2020 07/05/2020  Weight (lbs) 146 lb 13.2 oz 151 lb 0.2 oz 147 lb 11.3 oz  Weight (kg) 66.6 kg 68.5 kg 67 kg      Telemetry    Predominantly sinus rhythm with PACs and PVCs - Personally Reviewed  ECG    No new - Personally Reviewed  Physical Exam   GEN: No acute distress.  Frail appearing Neck: No JVD Cardiac: RRR with occasional premature beats, no murmurs, rubs, or gallops.  Respiratory: Clear to auscultation bilaterally anterior and lateral fields  GI: Soft, nontender, non-distended  MS: No lower extremity edema; No deformity. Neuro:  Nonfocal  Psych: Normal affect   Labs    High Sensitivity Troponin:   Recent Labs  Lab 07/05/20 0056 07/05/20 0402 07/06/20 1248 07/06/20 1523 07/06/20 1717  TROPONINIHS 112* 107* 91* 81* 86*      Chemistry Recent Labs  Lab 07/07/20 0406 07/07/20 0406 07/07/20 1358 07/08/20 0606 07/09/20 0508  NA 140   < > 142 143 144  K 3.3*   < > 3.3* 3.4* 3.0*  CL 108   < > 108 110 111  CO2 20*   < > 23 23 24   GLUCOSE 142*   < > 204* 121* 85  BUN 23   < > 21 16 15   CREATININE 0.68   < > 0.75 0.73 0.63  CALCIUM 7.3*   < > 6.9* 7.1* 7.5*  PROT 6.5  --   --  6.3* 6.4*  ALBUMIN 2.0*  --   --  1.9* 1.9*  AST 107*  --   --  87* 58*  ALT 25  --   --  19 16  ALKPHOS 750*  --   --  863* 745*  BILITOT 0.7  --   --  1.6* 0.6  GFRNONAA >60   < > >  60 >60 >60  ANIONGAP 12   < > 11 10 9    < > = values in this interval not displayed.     Hematology Recent Labs  Lab 07/07/20 0406 07/07/20 0406 07/07/20 1824 07/08/20 0606 07/09/20 0508  WBC 8.3  --   --  6.7 5.9  RBC 3.13*  --   --  3.47* 3.40*  HGB 7.4*   < > 6.9* 8.5* 8.5*  HCT 24.5*   < > 23.1* 28.0* 27.2*  MCV 78.3*  --   --  80.7 80.0  MCH 23.6*  --   --  24.5* 25.0*  MCHC 30.2  --   --  30.4 31.3  RDW 21.6*  --   --  20.6* 21.3*  PLT 194  --   --  134* 131*   < > = values in this interval not displayed.    BNPNo results for input(s): BNP, PROBNP in the last 168 hours.   DDimer No results for input(s): DDIMER in the last 168 hours.   Radiology    DG CHEST PORT 1 VIEW  Result Date: 07/09/2020 CLINICAL DATA:  Shortness of breath EXAM: PORTABLE CHEST 1 VIEW COMPARISON:  Yesterday FINDINGS: Borderline heart size accentuated by rotation and a moderate hiatal hernia. Indistinct opacity at the bases, greater on the left. No Kerley lines, effusion, or pneumothorax. IMPRESSION: Decreased lung volumes with increased presumed atelectasis.  Electronically Signed   By: Monte Fantasia M.D.   On: 07/09/2020 06:56   DG CHEST PORT 1 VIEW  Result Date: 07/08/2020 CLINICAL DATA:  Rales. EXAM: PORTABLE CHEST 1 VIEW COMPARISON:  07/07/2020.  CT 07/05/2020.  Multiple older films. FINDINGS: Heart size is normal. Right lung is clear. Infiltrate/volume loss at the left lung base consistent with pneumonia. No dense consolidation, lobar collapse or visible effusion by radiography. Small amount of pleural fluid visible on CT of 3 days ago. Moderate size hiatal hernia as seen previously. No acute bone finding. IMPRESSION: Persistent mild infiltrate/volume loss at the left lung base consistent with pneumonia. Electronically Signed   By: Nelson Chimes M.D.   On: 07/08/2020 08:30   DG CHEST PORT 1 VIEW  Result Date: 07/07/2020 CLINICAL DATA:  Abdominal pain. EXAM: PORTABLE CHEST 1 VIEW COMPARISON:  July 06, 2020. FINDINGS: The heart size and mediastinal contours are within normal limits. Both lungs are clear. Stable elevated left hemidiaphragm is noted. No pneumothorax or pleural effusion is noted. The visualized skeletal structures are unremarkable. IMPRESSION: No active disease. Electronically Signed   By: Marijo Conception M.D.   On: 07/07/2020 19:32   ECHOCARDIOGRAM COMPLETE  Result Date: 07/08/2020    ECHOCARDIOGRAM REPORT   Patient Name:   Adrian Singh Date of Exam: 07/07/2020 Medical Rec #:  [id number]      Height:       71.0 in Accession #:    [id number]     Weight:       151.0 lb Date of Birth:  09/01/1934     BSA:          1.871 m Patient Age:    86 years       BP:           149/79 mmHg Patient Gender: M              HR:           71 bpm. Exam Location:  Inpatient Procedure: 2D Echo, Cardiac Doppler and Color Doppler Indications:  Elevated Troponin  History:        Patient has prior history of Echocardiogram examinations, most                 recent 05/01/2020. CAD, Stroke, Arrythmias:Atrial Fibrillation;                 Risk  Factors:Hypertension, Diabetes and GERD. Cancer.  Sonographer:    Jonelle Sidle Dance Referring Phys: [phone] A CALDWELL POWELL JR  Sonographer Comments: Suboptimal subcostal window. IMPRESSIONS  1. Since the last study on 05/01/2020 LVEF has decreased 50-55% to 40-45% with diffuse hypokinesis and akinesis in the basal and mid inferior and inferolateral walls.  2. Left ventricular ejection fraction, by estimation, is 40 to 45%. The left ventricle has mildly decreased function. The left ventricle demonstrates regional wall motion abnormalities (see scoring diagram/findings for description). There is mild concentric left ventricular hypertrophy. Left ventricular diastolic parameters are consistent with Grade I diastolic dysfunction (impaired relaxation).  3. Right ventricular systolic function is normal. The right ventricular size is normal. There is normal pulmonary artery systolic pressure.  4. Left atrial size was mildly dilated.  5. The mitral valve is normal in structure. Mild mitral valve regurgitation. No evidence of mitral stenosis.  6. The aortic valve is normal in structure. Aortic valve regurgitation is not visualized. No aortic stenosis is present.  7. The inferior vena cava is normal in size with greater than 50% respiratory variability, suggesting right atrial pressure of 3 mmHg. FINDINGS  Left Ventricle: Left ventricular ejection fraction, by estimation, is 40 to 45%. The left ventricle has mildly decreased function. The left ventricle demonstrates regional wall motion abnormalities. The left ventricular internal cavity size was normal in size. There is mild concentric left ventricular hypertrophy. Left ventricular diastolic parameters are consistent with Grade I diastolic dysfunction (impaired relaxation). Normal left ventricular filling pressure.  LV Wall Scoring: The inferior wall, posterior wall, and basal inferoseptal segment are akinetic. Right Ventricle: The right ventricular size is normal. No increase  in right ventricular wall thickness. Right ventricular systolic function is normal. There is normal pulmonary artery systolic pressure. The tricuspid regurgitant velocity is 2.33 m/s, and  with an assumed right atrial pressure of 8 mmHg, the estimated right ventricular systolic pressure is 93.8 mmHg. Left Atrium: Left atrial size was mildly dilated. Right Atrium: Right atrial size was normal in size. Pericardium: There is no evidence of pericardial effusion. Mitral Valve: The mitral valve is normal in structure. Mild mitral valve regurgitation. No evidence of mitral valve stenosis. Tricuspid Valve: The tricuspid valve is normal in structure. Tricuspid valve regurgitation is not demonstrated. No evidence of tricuspid stenosis. Aortic Valve: The aortic valve is normal in structure. Aortic valve regurgitation is not visualized. No aortic stenosis is present. Pulmonic Valve: The pulmonic valve was normal in structure. Pulmonic valve regurgitation is not visualized. No evidence of pulmonic stenosis. Aorta: The aortic root is normal in size and structure. Venous: The inferior vena cava is normal in size with greater than 50% respiratory variability, suggesting right atrial pressure of 3 mmHg. IAS/Shunts: No atrial level shunt detected by color flow Doppler.  LEFT VENTRICLE PLAX 2D LVIDd:         4.70 cm LVIDs:         2.90 cm LV PW:         1.20 cm LV IVS:        1.10 cm LVOT diam:     2.20 cm LV SV:  70 LV SV Index:   37 LVOT Area:     3.80 cm  RIGHT VENTRICLE RV Basal diam:  1.90 cm TAPSE (M-mode): 1.9 cm LEFT ATRIUM             Index       RIGHT ATRIUM           Index LA diam:        3.90 cm 2.08 cm/m  RA Area:     11.90 cm LA Vol (A2C):   82.1 ml 43.87 ml/m RA Volume:   24.00 ml  12.83 ml/m LA Vol (A4C):   57.7 ml 30.84 ml/m LA Biplane Vol: 69.9 ml 37.35 ml/m  AORTIC VALVE LVOT Vmax:   100.25 cm/s LVOT Vmean:  61.700 cm/s LVOT VTI:    0.184 m  AORTA Ao Root diam: 3.60 cm Ao Asc diam:  3.10 cm MITRAL  VALVE               TRICUSPID VALVE MV Area (PHT): 2.32 cm    TR Peak grad:   21.7 mmHg MV Decel Time: 327 msec    TR Vmax:        233.00 cm/s MV E velocity: 61.60 cm/s MV A velocity: 81.90 cm/s  SHUNTS MV E/A ratio:  0.75        Systemic VTI:  0.18 m                            Systemic Diam: 2.20 cm Ena Dawley MD Electronically signed by Ena Dawley MD Signature Date/Time: 07/08/2020/9:03:41 AM    Final     Cardiac Studies   Echocardiogram 07/07/20: 1. Since the last study on 05/01/2020 LVEF has decreased 50-55% to 40-45%  with diffuse hypokinesis and akinesis in the basal and mid inferior and  inferolateral walls.  2. Left ventricular ejection fraction, by estimation, is 40 to 45%. The  left ventricle has mildly decreased function. The left ventricle  demonstrates regional wall motion abnormalities (see scoring  diagram/findings for description). There is mild  concentric left ventricular hypertrophy. Left ventricular diastolic  parameters are consistent with Grade I diastolic dysfunction (impaired  relaxation).  3. Right ventricular systolic function is normal. The right ventricular  size is normal. There is normal pulmonary artery systolic pressure.  4. Left atrial size was mildly dilated.  5. The mitral valve is normal in structure. Mild mitral valve  regurgitation. No evidence of mitral stenosis.  6. The aortic valve is normal in structure. Aortic valve regurgitation is  not visualized. No aortic stenosis is present.  7. The inferior vena cava is normal in size with greater than 50%  respiratory variability, suggesting right atrial pressure of 3 mmHg.   Patient Profile     86 y.o. male with a PMH of CAD s/p PCI/DES to LCx in 2017, paroxysmal atrial fibrillation, HTN c/b orthostatic hypotension, HLD, DM type 2, CVA, metastatic prostate cancer,  who is being seen today for the evaluation of an abnormal echocardiogram at the request of Dr. Florene Glen.  Assessment & Plan     Chest pain History of CAD with prior PCI to LCx Abnormal echo -I agree with Dr. Sallyanne Kuster regarding wall motion. I read his echo in 04/2020 and noted severe hypokinesis in the inferolateral and moderate hypokinesis of the inferior wall. These are seen again on most recent echo. Not acute wall motion changes -with anemia, overall poor functional status, flat hsTni, and improvement  in his pain post decompression, no indication for ischemic evaluation at this time  Paroxysmal atrial fibrillation, with history of prior CVA -with anemia requiring transfusion, risk of anticoagulation is high -CHA2DS2/VAS Stroke Risk Points=7 , which is high risk for future stroke -complex situation, with significant risk on both sides. Given his acute illness and anemia, would hold on anticoagulation indefinitely  History of orthostatic hypotension: -would not be overaggressive with blood pressure management, as he has had recent admission for hypotension  I agree that there is nothing for cardiology to offer as a change at this time. Will defer decision on goals of care to primary team, but given overall picture a discussion to focus on symptoms would be reasonable.  [facility] will sign off.   Medication Recommendations:  Continue atorvastatin Continue metoprolol succinate 25 mg daily at discharge Would not return to outpatient rivaroxaban given anemia requiring transfusion Other recommendations (labs, testing, etc):  none Follow up as an outpatient:  He had missed his last several cardiology appts. We will set up a follow up for him.  For questions or updates, please contact [place] Please consult [email or web address] for contact info under        Signed, Buford Dresser, MD  07/09/2020, 11:35 AM

## 2020-07-09 NOTE — Progress Notes (Signed)
Pt converted to a-flutter this shift at 1445. HR ranging from 120's-140's and asymptomatic. MD notified. IV PRN metoprolol and EKG ordered. Pt is in no acute distress. Willl continue to monitor.

## 2020-07-10 DIAGNOSIS — Z515 Encounter for palliative care: Secondary | ICD-10-CM

## 2020-07-10 DIAGNOSIS — N183 Chronic kidney disease, stage 3 unspecified: Secondary | ICD-10-CM

## 2020-07-10 DIAGNOSIS — K567 Ileus, unspecified: Secondary | ICD-10-CM | POA: Diagnosis not present

## 2020-07-10 DIAGNOSIS — E1122 Type 2 diabetes mellitus with diabetic chronic kidney disease: Secondary | ICD-10-CM | POA: Diagnosis not present

## 2020-07-10 DIAGNOSIS — R627 Adult failure to thrive: Secondary | ICD-10-CM | POA: Diagnosis not present

## 2020-07-10 DIAGNOSIS — I2510 Atherosclerotic heart disease of native coronary artery without angina pectoris: Secondary | ICD-10-CM

## 2020-07-10 DIAGNOSIS — R531 Weakness: Secondary | ICD-10-CM

## 2020-07-10 DIAGNOSIS — Z8673 Personal history of transient ischemic attack (TIA), and cerebral infarction without residual deficits: Secondary | ICD-10-CM | POA: Diagnosis not present

## 2020-07-10 DIAGNOSIS — Z9861 Coronary angioplasty status: Secondary | ICD-10-CM

## 2020-07-10 DIAGNOSIS — Z7189 Other specified counseling: Secondary | ICD-10-CM

## 2020-07-10 LAB — COMPREHENSIVE METABOLIC PANEL
ALT: 16 U/L (ref 0–44)
AST: 37 U/L (ref 15–41)
Albumin: 1.8 g/dL — ABNORMAL LOW (ref 3.5–5.0)
Alkaline Phosphatase: 559 U/L — ABNORMAL HIGH (ref 38–126)
Anion gap: 11 (ref 5–15)
BUN: 20 mg/dL (ref 8–23)
CO2: 24 mmol/L (ref 22–32)
Calcium: 7.6 mg/dL — ABNORMAL LOW (ref 8.9–10.3)
Chloride: 108 mmol/L (ref 98–111)
Creatinine, Ser: 0.68 mg/dL (ref 0.61–1.24)
GFR, Estimated: >60 mL/min (ref >60)
Glucose, Bld: 148 mg/dL — ABNORMAL HIGH (ref 70–99)
Potassium: 3.4 mmol/L — ABNORMAL LOW (ref 3.5–5.1)
Sodium: 143 mmol/L (ref 135–145)
Total Bilirubin: 0.9 mg/dL (ref 0.3–1.2)
Total Protein: 6 g/dL — ABNORMAL LOW (ref 6.5–8.1)

## 2020-07-10 LAB — CBC WITH DIFFERENTIAL/PLATELET
Abs Immature Granulocytes: 0.46 10*3/uL — ABNORMAL HIGH (ref 0.00–0.07)
Basophils Absolute: 0 10*3/uL (ref 0.0–0.1)
Basophils Relative: 1 %
Eosinophils Absolute: 0 10*3/uL (ref 0.0–0.5)
Eosinophils Relative: 1 %
HCT: 28.1 % — ABNORMAL LOW (ref 39.0–52.0)
Hemoglobin: 8.5 g/dL — ABNORMAL LOW (ref 13.0–17.0)
Immature Granulocytes: 9 %
Lymphocytes Relative: 16 %
Lymphs Abs: 0.8 10*3/uL (ref 0.7–4.0)
MCH: 24.9 pg — ABNORMAL LOW (ref 26.0–34.0)
MCHC: 30.2 g/dL (ref 30.0–36.0)
MCV: 82.4 fL (ref 80.0–100.0)
Monocytes Absolute: 0.6 10*3/uL (ref 0.1–1.0)
Monocytes Relative: 11 %
Neutro Abs: 3.4 10*3/uL (ref 1.7–7.7)
Neutrophils Relative %: 62 %
Platelets: 123 10*3/uL — ABNORMAL LOW (ref 150–400)
RBC: 3.41 MIL/uL — ABNORMAL LOW (ref 4.22–5.81)
RDW: 21.5 % — ABNORMAL HIGH (ref 11.5–15.5)
WBC: 5.4 10*3/uL (ref 4.0–10.5)
nRBC: 0.9 % — ABNORMAL HIGH (ref 0.0–0.2)

## 2020-07-10 LAB — OCCULT BLOOD X 1 CARD TO LAB, STOOL: Fecal Occult Bld: NEGATIVE

## 2020-07-10 LAB — PHOSPHORUS: Phosphorus: 2.3 mg/dL — ABNORMAL LOW (ref 2.5–4.6)

## 2020-07-10 LAB — GLUCOSE, CAPILLARY
Glucose-Capillary: 143 mg/dL — ABNORMAL HIGH (ref 70–99)
Glucose-Capillary: 138 mg/dL — ABNORMAL HIGH (ref 70–99)
Glucose-Capillary: 192 mg/dL — ABNORMAL HIGH (ref 70–99)
Glucose-Capillary: 191 mg/dL — ABNORMAL HIGH (ref 70–99)
Glucose-Capillary: 236 mg/dL — ABNORMAL HIGH (ref 70–99)

## 2020-07-10 LAB — MAGNESIUM: Magnesium: 2 mg/dL (ref 1.7–2.4)

## 2020-07-10 MED ORDER — METOPROLOL TARTRATE 25 MG PO TABS
25.0000 mg | ORAL_TABLET | Freq: Three times a day (TID) | ORAL | Status: DC
  Administered 2020-07-10: 25 mg via ORAL
  Filled 2020-07-10: qty 1

## 2020-07-10 MED ORDER — ONDANSETRON HCL 4 MG/2ML IJ SOLN
4.0000 mg | Freq: Three times a day (TID) | INTRAMUSCULAR | Status: DC | PRN
  Administered 2020-07-10 – 2020-07-13 (×2): 4 mg via INTRAVENOUS
  Filled 2020-07-10 (×2): qty 2

## 2020-07-10 MED ORDER — PHENOL 1.4 % MT LIQD
1.0000 | OROMUCOSAL | Status: DC | PRN
  Administered 2020-07-10: 1 via OROMUCOSAL
  Filled 2020-07-10: qty 177

## 2020-07-10 MED ORDER — METOPROLOL TARTRATE 5 MG/5ML IV SOLN: 5.0000 mg | Freq: Four times a day (QID) | INTRAVENOUS | Status: DC | PRN

## 2020-07-10 MED ORDER — METOPROLOL TARTRATE 25 MG PO TABS
25.0000 mg | ORAL_TABLET | Freq: Four times a day (QID) | ORAL | Status: DC
  Administered 2020-07-10 – 2020-07-11 (×3): 25 mg via ORAL
  Filled 2020-07-10 (×3): qty 1

## 2020-07-10 MED ORDER — POTASSIUM CHLORIDE CRYS ER 20 MEQ PO TBCR
30.0000 meq | EXTENDED_RELEASE_TABLET | ORAL | Status: AC
  Administered 2020-07-10 (×2): 30 meq via ORAL
  Filled 2020-07-10 (×2): qty 1

## 2020-07-10 NOTE — Progress Notes (Signed)
[facility] Progress Note  Chief Complaint: Colonic ileus  History:  Adrian Singh is about the same as last I saw him.  He is still febrile, ongoing cardiac issues, bedbound, eating very little.  Palliative care is seeing him. He cannot give much history or review of systems as he is somnolent and his mouth is very dry.  He does not seem to be having abdominal pain chest pain or dyspnea at this time.   Objective:   Current Facility-Administered Medications:  .  acetaminophen (TYLENOL) tablet 650 mg, 650 mg, Oral, Q6H PRN, 650 mg at 07/09/20 2004 **OR** acetaminophen (TYLENOL) suppository 650 mg, 650 mg, Rectal, Q6H PRN, Rise Patience, MD .  atorvastatin (LIPITOR) tablet 20 mg, 20 mg, Oral, Daily, Elodia Florence., MD, 20 mg at 07/10/20 0849 .  benzonatate (TESSALON) capsule 100 mg, 100 mg, Oral, TID PRN, Elodia Florence., MD, 100 mg at 07/10/20 0128 .  cefTRIAXone (ROCEPHIN) 2 g in sodium chloride 0.9 % 100 mL IVPB, 2 g, Intravenous, Q24H, Elodia Florence., MD, Last Rate: 200 mL/hr at 07/09/20 1446, 2 g at 07/09/20 1446 .  Chlorhexidine Gluconate Cloth 2 % PADS 6 each, 6 each, Topical, Daily, Rise Patience, MD, 6 each at 07/10/20 [phone] .  diclofenac Sodium (VOLTAREN) 1 % topical gel 2 g, 2 g, Topical, QID, Elodia Florence., MD, 2 g at 07/10/20 0849 .  doxycycline (VIBRA-TABS) tablet 100 mg, 100 mg, Oral, Q12H, Elodia Florence., MD, 100 mg at 07/10/20 0849 .  feeding supplement (ENSURE ENLIVE / ENSURE PLUS) liquid 237 mL, 237 mL, Oral, BID BM, Allie Bossier, MD, 237 mL at 07/10/20 0849 .  hydrALAZINE (APRESOLINE) injection 5 mg, 5 mg, Intravenous, Q6H PRN, Elodia Florence., MD .  insulin aspart (novoLOG) injection 0-9 Units, 0-9 Units, Subcutaneous, Q4H, Rise Patience, MD, 1 Units at 07/10/20 0850 .  lidocaine (LIDODERM) 5 % 1 patch, 1 patch, Transdermal, Q24H, Elodia Florence., MD, 1 patch at 07/09/20 1430 .  metoprolol tartrate  (LOPRESSOR) tablet 12.5 mg, 12.5 mg, Oral, Q6H, Elodia Florence., MD, 12.5 mg at 07/10/20 0521 .  morphine 2 MG/ML injection 2 mg, 2 mg, Intravenous, Q4H PRN, Elodia Florence., MD, 2 mg at 07/07/20 1834 .  nitroGLYCERIN (NITROSTAT) SL tablet 0.4 mg, 0.4 mg, Sublingual, Q5 min PRN, Elodia Florence., MD .  pantoprazole (PROTONIX) injection 40 mg, 40 mg, Intravenous, Q24H, Kennedy-Smith, Colleen M, NP, 40 mg at 07/09/20 1430  . cefTRIAXone (ROCEPHIN)  IV 2 g (07/09/20 1446)     Vital signs in last 24 hrs: Vitals:   07/09/20 2336 07/10/20 0516  BP: 120/84 101/76  Pulse: (!) 119 96  Resp: 18 16  Temp: 97.9 F (36.6 C) 97.6 F (36.4 C)  SpO2: 98% 99%    Intake/Output Summary (Last 24 hours) at 07/10/2020 3016 Last data filed at 07/09/2020 1900 Gross per 24 hour  Intake 721.24 ml  Output 300 ml  Net 421.24 ml     Physical Exam Frail and weak, in no acute distress.  Mouth is very dry  Cardiac: RRR without murmurs, S1S2 heard, no peripheral edema  Pulm: clear to auscultation bilaterally, normal RR and effort noted  Abdomen: soft, no tenderness, mildly tympanic, soft, active bowel sounds of normal character  Skin; warm and dry, pale, no jaundice  Recent Labs:  CBC Latest Ref Rng & Units 07/10/2020 07/09/2020 07/08/2020  WBC 4.0 -  10.5 K/uL 5.4 5.9 6.7  Hemoglobin 13.0 - 17.0 g/dL 8.5(L) 8.5(L) 8.5(L)  Hematocrit 39 - 52 % 28.1(L) 27.2(L) 28.0(L)  Platelets 150 - 400 K/uL 123(L) 131(L) 134(L)    No results for input(s): INR in the last 168 hours. CMP Latest Ref Rng & Units 07/10/2020 07/09/2020 07/08/2020  Glucose 70 - 99 mg/dL 148(H) 85 121(H)  BUN 8 - 23 mg/dL 20 15 16   Creatinine 0.61 - 1.24 mg/dL 0.68 0.63 0.73  Sodium 135 - 145 mmol/L 143 144 143  Potassium 3.5 - 5.1 mmol/L 3.4(L) 3.0(L) 3.4(L)  Chloride 98 - 111 mmol/L 108 111 110  CO2 22 - 32 mmol/L 24 24 23   Calcium 8.9 - 10.3 mg/dL 7.6(L) 7.5(L) 7.1(L)  Total Protein 6.5 - 8.1 g/dL 6.0(L)  6.4(L) 6.3(L)  Total Bilirubin 0.3 - 1.2 mg/dL 0.9 0.6 1.6(H)  Alkaline Phos 38 - 126 U/L 559(H) 745(H) 863(H)  AST 15 - 41 U/L 37 58(H) 87(H)  ALT 0 - 44 U/L 16 16 19      Assessment & Plan  Assessment:  Colonic ileus, resolved after bedside decompression several days ago.  He probably has chronic dysmotility from his debility and lack of activity.   Plan: No GI interventions needed If patient develops marked distention such as occurred earlier last week, he should have a rectal exam with a few minutes of careful abdominal compression as that decompressed the acute colonic ileus several days ago.  If he does not have a bowel movement for 2 days, give him a Dulcolax suppository  Diet as tolerated.  Palliative care is seeing him and addressing goals of care.  Signing off, call as needed.   Adrian Singh Office: [phone]

## 2020-07-10 NOTE — Consult Note (Signed)
Consultation Note Date: 07/10/2020   Patient Name: Adrian Singh  DOB: 10/17/1933  MRN: [id number]  Age / Sex: 86 y.o., male  PCP: [name], MD Referring Physician: Elodia Florence., *  Reason for Consultation: Establishing goals of care and Psychosocial/spiritual support  HPI/Patient Profile: 86 y.o. male   admitted on 07/04/2020 with past history of CAD status post stenting, A. fib, diabetes mellitus type 2, chronic anemia, history of stroke with dysarthria and right-sided weakness recently admitted 10 days ago for orthostatic hypotension at that time patient had some medication changes presents to the ER with complaints of 2 days of increasing abdominal discomfort with distention last bowel movement was more than 24 hours ago.    ED Course: In the ER patient was in A. fib with RVR.  CT abdomen pelvis shows features concerning for possible ileus.  Labs are significant for high sensitive troponin of 8712.  Hemoglobin is 9.2 potassium was low at 2.6 Covid test was negative.  Patient was started on potassium replacement and also since patient is in A. fib with RVR Cardizem bolus was initially given then started on Cardizem infusion.  Admitted for ileus and A. fib with RVR.    Today is day 5 of this hospital stay  Multiple re-hospitalaitzations over the past six months and continued physical and functional declined. Care in the home is becoming more difficult.   Patient and his family face treatment option decisions, advanced directive decisions and anticipatory care needs.   Clinical Assessment and Goals of Care:   This NP Wadie Lessen reviewed medical records, received report from team, assessed the patient and then meet at the patient's bedside and then spoke to daughter/[name] York to discuss diagnosis, prognosis, GOC, EOL wishes disposition and options.   Concept of Palliative Care was  introduced as specialized medical care for people and their families living with serious illness.  If focuses on providing relief from the symptoms and stress of a serious illness.  The goal is to improve quality of life for both the patient and the family.  Created space and opportunity for patient and his family to explore thoughts and   feelings regarding current medical situation. Patient recognizes that he is physically declining and worries about family's ability to care for him at home.    Education offered on concept specific to human mortality and adult failure to thrive.  We discussed the limitations of medical interventions to promote long quality of life when the body fails to thrive.  Education offered to both patient and his daughter regarding transition of care options specifically to hospice services in the home.  We did discuss short-term rehabilitation.   A  discussion was had today regarding advanced directives.  Concepts specific to code status, artifical feeding and hydration, continued IV antibiotics and rehospitalization was had.  The difference between a aggressive medical intervention path  and a palliative comfort care path for this patient at this time was had.  Values and goals of care important to  patient and family were attempted to be elicited.   MOST form reviewed    Questions and concerns addressed.  Patient  encouraged to call with questions or concerns.     PMT will continue to support holistically.           Patient is his own decision maker at this time with the support of his family. Wife and three children are living and available.      SUMMARY OF RECOMMENDATIONS    Code Status/Advance Care Planning:  Limited code -no intubation.       Palliative Prophylaxis:   Aspiration, Bowel Regimen, Delirium Protocol, Frequent Pain Assessment and Oral Care  Additional Recommendations (Limitations, Scope, Preferences): -Continue current medical  interventions.  Plan is to meet tomorrow afternoon at  3:00 with family for further clarification of GOCs    Psycho-social/Spiritual:   Desire for further Chaplaincy support:yes  Additional Recommendations: Education on Hospice  Prognosis:   Unable to determine  Discharge Planning: To Be Determined      Primary Diagnoses: Present on Admission: . Ileus ([place]) . Essential hypertension . Uncontrolled type 2 diabetes mellitus with diabetic polyneuropathy, without long-term current use of insulin ([place]) . CKD stage 3 due to type 2 diabetes mellitus ([place]) . Prostate cancer ([place]) . Pressure injury of skin . Hypokalemia . Atrial fibrillation with RVR ([place])   I have reviewed the medical record, interviewed the patient and family, and examined the patient. The following aspects are pertinent.  Past Medical History:  Diagnosis Date  . Arthritis    "legs, back" (10/06/2015)  . Atrial fibrillation ([place])    x1  . Atrial fibrillation ([place])   . CAD (coronary artery disease)    nonobstructive  . Cancer ([place])    skin cancer on ear (froze it off) and back (cut it off)  . Dysrhythmia   . Esophageal reflux    hx of  . History of kidney stones   . Hx of heart artery stent   . Hypertrophy of prostate without urinary obstruction and other lower urinary tract symptoms (LUTS)   . Osteoarthrosis, unspecified whether generalized or localized, unspecified site   . Prostate cancer ([place])   . Skin cancer   . Stroke [facility]) 03/2014   "had a series of mini strokes; maybe 4"; denies residual on 10/06/2015  . Thrombocytopenia ([place])   . Type II diabetes mellitus ([place])    type 2  . Unspecified essential hypertension    Social History   Socioeconomic History  . Marital status: Married    Spouse name: Not on file  . Number of children: 4  . Years of education: Not on file  . Highest education level: Not on file  Occupational History  . Occupation: Radiographer, 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[employer]    Employer:  RETIRED  Tobacco Use  . Smoking status: Former Smoker    Years: 3.00    Types: Cigarettes  . Smokeless tobacco: Never Used  . Tobacco comment: " Quit smoking by age 49; was a someday smoker "  Vaping Use  . Vaping Use: Never used  Substance and Sexual Activity  . Alcohol use: No  . Drug use: No  . Sexual activity: Never  Other Topics Concern  . Not on file  Social History Narrative   No living will   Plans wife and then children to make health care decisions for him if unable   Would request at least attempts at resuscitation but no prolonged life support  Doesn't think he would want tube feeds if cognitively unaware   Social Determinants of Health   Financial Resource Strain:   . Difficulty of Paying Living Expenses: Not on file  Food Insecurity:   . Worried About Charity fundraiser in the Last Year: Not on file  . Ran Out of Food in the Last Year: Not on file  Transportation Needs:   . Lack of Transportation (Medical): Not on file  . Lack of Transportation (Non-Medical): Not on file  Physical Activity:   . Days of Exercise per Week: Not on file  . Minutes of Exercise per Session: Not on file  Stress:   . Feeling of Stress : Not on file  Social Connections:   . Frequency of Communication with Friends and Family: Not on file  . Frequency of Social Gatherings with Friends and Family: Not on file  . Attends Religious Services: Not on file  . Active Member of Clubs or Organizations: Not on file  . Attends Archivist Meetings: Not on file  . Marital Status: Not on file   Family History  Problem Relation Age of Onset  . Stroke Father   . Peripheral vascular disease Father        amputation  . Heart failure Mother        CHF  . Coronary artery disease Mother   . Heart attack Mother 62       Multiple  . COPD Brother   . Heart disease Brother   . Cancer Brother        Bone  . Lung cancer Sister    Scheduled Meds: . atorvastatin  20 mg Oral Daily  .  Chlorhexidine Gluconate Cloth  6 each Topical Daily  . diclofenac Sodium  2 g Topical QID  . doxycycline  100 mg Oral Q12H  . feeding supplement  237 mL Oral BID BM  . insulin aspart  0-9 Units Subcutaneous Q4H  . lidocaine  1 patch Transdermal Q24H  . metoprolol tartrate  12.5 mg Oral Q6H  . pantoprazole (PROTONIX) IV  40 mg Intravenous Q24H   Continuous Infusions: . cefTRIAXone (ROCEPHIN)  IV 2 g (07/09/20 1446)   PRN Meds:.acetaminophen **OR** acetaminophen, benzonatate, hydrALAZINE, morphine injection, nitroGLYCERIN Medications Prior to Admission:  Prior to Admission medications   Medication Sig Start Date End Date Taking? Authorizing Provider  acetaminophen (TYLENOL) 325 MG tablet Take 650 mg by mouth every 12 (twelve) hours as needed for mild pain or moderate pain.   Yes [provider]  Amino Acids-Protein Hydrolys (FEEDING SUPPLEMENT, PRO-STAT SUGAR FREE 64,) LIQD Take 30 mLs by mouth in the morning and at bedtime.   Yes [provider]  Ascorbic Acid (VITAMIN C) 1000 MG tablet Take 1,000 mg by mouth daily.   Yes [provider]  atorvastatin (LIPITOR) 20 MG tablet Take 20 mg by mouth at bedtime.    Yes [provider]  B Complex Vitamins (VITAMIN B COMPLEX PO) Take 1 tablet by mouth daily.   Yes [provider]  calcium carbonate (CALCIUM 600) 1500 (600 Ca) MG TABS tablet Take 600 mg of elemental calcium by mouth daily.    Yes [provider]  Cholecalciferol (VITAMIN D3) 1.25 MG (50000 UT) CAPS Take 50,000 Units by mouth once a week. Saturday   Yes [provider]  cyanocobalamin 1000 MCG tablet Take 1,000 mg by mouth at bedtime.    Yes [provider]  docusate sodium (COLACE) 100 MG  capsule Take 200 mg by mouth 2 (two) times daily as needed for mild constipation.   Yes [provider]  enzalutamide (XTANDI) 80 MG tablet Take 80 mg by mouth daily.   Yes [provider]  furosemide (LASIX) 40  MG tablet Take 40 mg by mouth daily as needed.   Yes [provider]  glimepiride (AMARYL) 2 MG tablet Take 1 tablet (2 mg total) by mouth every morning. Patient taking differently: Take 2 mg by mouth daily.  06/13/20 06/13/21 Yes Georgette Shell, MD  metoprolol tartrate (LOPRESSOR) 25 MG tablet Take 0.5 tablets (12.5 mg total) by mouth 2 (two) times daily. 06/24/20  Yes Antonieta Pert, MD  Multiple Vitamin (MULTIVITAMIN WITH MINERALS) TABS tablet Take 1 tablet by mouth daily.    Yes [provider]  nitroGLYCERIN (NITROSTAT) 0.4 MG SL tablet Place 1 tablet (0.4 mg total) under the tongue every 5 (five) minutes as needed for chest pain. Patient taking differently: Place 0.4 mg under the tongue every 5 (five) minutes as needed for chest pain. If no relief, call MD 05/15/18  Yes [name], MD  polyethylene glycol (MIRALAX / GLYCOLAX) 17 g packet Take 17 g by mouth daily.   Yes [provider]  rivaroxaban (XARELTO) 20 MG TABS tablet Take 1 tablet (20 mg total) by mouth daily with supper. 01/21/20  Yes [place], Peter M, MD  sodium chloride 1 g tablet Take 1 g by mouth 2 (two) times daily with a meal.   Yes [provider]  traMADol (ULTRAM) 50 MG tablet Take 1 tablet (50 mg total) by mouth 3 (three) times daily as needed. Patient taking differently: Take 50 mg by mouth 3 (three) times daily as needed for moderate pain.  06/13/20  Yes Georgette Shell, MD  BAYER MICROLET LANCETS lancets Use to test blood sugar once daily dx: E11.40 10/18/15   [name], MD  Blood Glucose Monitoring Suppl (CONTOUR NEXT EZ MONITOR) w/Device KIT USE TO TEST BLOOD SUGAR ONCE DAILY. Dx Code E11.40 06/13/16   Viviana Simpler I, MD  glucose blood (CONTOUR NEXT TEST) test strip Check blood sugar once daily and as directed.Dx Code E11.40 03/19/19   [name], MD   Allergies  Allergen Reactions  . Soma [Carisoprodol] Other (See Comments)    "did a number on me"  . Doxazosin  Other (See Comments)    dizziness   Review of Systems  Neurological: Positive for weakness.    Physical Exam Cardiovascular:     Rate and Rhythm: Normal rate.  Abdominal:     General: Bowel sounds are increased.  Skin:    General: Skin is warm and dry.  Neurological:     Mental Status: He is alert.     Vital Signs: BP 101/76 (BP Location: Left Arm)   Pulse 96   Temp 97.6 F (36.4 C)   Resp 16   Ht '5\' 11"'  (1.803 m)   Wt 67.9 kg   SpO2 99%   BMI 20.88 kg/m  Pain Scale: 0-10   Pain Score: 0-No pain   SpO2: SpO2: 99 % O2 Device:SpO2: 99 % O2 Flow Rate: .   IO: Intake/output summary:   Intake/Output Summary (Last 24 hours) at 07/10/2020 0916 Last data filed at 07/09/2020 1900 Gross per 24 hour  Intake 721.24 ml  Output 300 ml  Net 421.24 ml    LBM: Last BM Date: 07/10/20 Baseline Weight: Weight: 65 kg Most recent weight: Weight: 67.9  kg     Palliative Assessment/Data: 30 % at best   Discussed with Dr Florene Glen   Time In: 1000 Time Out: 1115 Time Total: 75 minutes Greater than 50%  of this time was spent counseling and coordinating care related to the above assessment and plan.  Signed by: Wadie Lessen, NP   Please contact Palliative Medicine Team phone at [phone] for questions and concerns.  For individual provider: See Shea Evans

## 2020-07-10 NOTE — Progress Notes (Signed)

## 2020-07-10 NOTE — Progress Notes (Signed)
PROGRESS NOTE    Adrian Singh  UXL:[id number] DOB: Jun 07, 1934 DOA: 07/04/2020 PCP: Adrian Carbon, MD  Chief Complaint  Patient presents with  . Abdominal Pain   Brief Narrative:  Adrian Singh 86 y.o.WM PMHx CAD status post stenting, Adrian Singh. 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 Denies any vomiting fever or chills. Abdominal discomfort is generalized. Was able to take his medications.  Assessment & Plan:   Principal Problem:   Ileus ([place]) Active Problems:   Essential hypertension   Uncontrolled type 2 diabetes mellitus with diabetic polyneuropathy, without long-term current use of insulin ([place])   CKD stage 3 due to type 2 diabetes mellitus ([place])   History of CVA (cerebrovascular accident)   CAD S/P percutaneous coronary angioplasty   Prostate cancer ([place])   Weakness generalized   Elevated troponin   Pressure injury of skin   Adult failure to thrive   Hypokalemia   Atrial fibrillation with RVR (HCC)  Ileus - CT scan consistent with ileus -- KUB 10/21 with stable diffuse gaseous distension -- continued abdominal distension today, though improved -- will consider repeat CT scan if patient with worsening pain/symptoms -- replace mag/K as needed to keep >2/4 respectively -- seems to be improving, continue soft diet -- will discuss with GI, appreciate recommendations (see 10/24 GI note)  Adrian Singh Fib with RVR -Cardizem drip - continue while  NPO - increase metop as tolerated -Heparin drip per pharmacy some hematuria noted 10/21, was on hold - per cards, plan to d/c outpatient xarelto   Elevated troponin  Chest Pain  Coronary Artery Disease s/p stenting -- c/o intermittent chest discomfort 10/20, unclear etiology, he c/o diffuse pain at times.  Troponin was elevated, but relatively flat, not c/w ACS and EKG was without changes concerning for ACS. -- echo with EF to 40-45%, diffuse hypokinesis and akinesis in basal and mid inferior and inferolateral walls -- will consult cardiology given decreased EF -> cards recommending atoravastatin, metoprolol succinate 25 mg daily -- will continue to monitor, asymptomatic today  HCAP Start abx with ceftriaxone/doxy for now (10/22 - present) SLP eval for possible aspiration Follow MRSA and sputum cx if able Repeat CXR 10/23 am with decreased lung volumes with increased atelectasis  Hypokalemia  Hypophosphatemia - replace and follow   Elevated LFT's  Elevated Alk Phos:  - normal bilirubin, GGT wnl -> suspect alk phos 2/2 bone mets - Appreciate Gi assistance, will continue to follow, may need additional imaging  Chronic pain syndrome -Morphine PRN --at home is on tramadol per med rec  Chronic systolic CHF -Strict in and out -Daily weight  --caution with IVF, takes 40 mg lasix daily as need at home --echo with decreased EF 40-45% with diffuse hypokinesis, appreciate cardiology recs  Chronic anemia  Thrombocytopenia -labs suggestive of AOCD -- s/p 1 unit pRBC -- stable today - hypoprolieferative retics - continue to trend  Hematuria - while on heparin gtt, follow UA - hold heparin for now - UA with >50 RBC's, urine appears clear this AM - follow outpatient  NAGMA - improved, continue to monitor  DM type II controlled with complication; DM gastroparesis  Hypoglycemia -10/6 hemoglobin A1c= 6.5 -Sensitive SSI -- follow with D5 containing fluids  Metastatic  prostate cancer  Hx CVA with residual left sided weakness and dysarthria Has abnormal MRI brain from 04/2020 and short term follow up was recommended Will order repeat MRI with contrast  Goals of Care: Will c/s palliative care to assist with [place] conversations.  Discussed this with pt and daughter at bedside 10/23.  DVT prophylaxis: heparin  Code Status: full  Family Communication: none at bedside - daughter 10/23 Disposition:   Status is: Inpatient  Remains inpatient appropriate because:Inpatient level of care appropriate due to severity of illness   Dispo: The patient is from: Home              Anticipated d/c is to: pending              Anticipated d/c date is: > 3 days              Patient currently is not medically stable to d/c.    Consultants:   GI  Procedures:  none  Antimicrobials:  Anti-infectives (From admission, onward)   Start     Dose/Rate Route Frequency Ordered Stop   07/08/20 1230  cefTRIAXone (ROCEPHIN) 2 g in sodium chloride 0.9 % 100 mL IVPB        2 g 200 mL/hr over 30 Minutes Intravenous Every 24 hours 07/08/20 1141 07/13/20 1229   07/08/20 1230  doxycycline (VIBRA-TABS) tablet 100 mg        100 mg Oral Every 12 hours 07/08/20 1141       Subjective: No new complaints today Feels well overall   Objective: Vitals:   07/09/20 2336 07/10/20 0500 07/10/20 0516 07/10/20 1344  BP: 120/84  101/76 119/81  Pulse: (!) 119  96 (!) 54  Resp: '18  16 17  ' Temp: 97.9 F (36.6 C)  97.6 F (36.4 C) 98.2 F (36.8 C)  TempSrc: Oral   Oral  SpO2: 98%  99% 99%  Weight:  67.9 kg    Height:        Intake/Output Summary (Last 24 hours) at 07/10/2020 1601 Last data filed at 07/10/2020 1300 Gross per 24 hour  Intake 601.24 ml  Output 450 ml  Net 151.24 ml   Filed Weights   07/06/20 0417 07/09/20 0500 07/10/20 0500  Weight: 68.5 kg 66.6 kg 67.9 kg    Examination:  General: No acute distress. Cardiovascular: Heart sounds show Adrian Singh regular rate, and  rhythm Lungs: Clear to auscultation bilaterally  Abdomen: Soft, nontender, nondistended Neurological: Alert and oriented 3. Moves all extremities 4. Cranial nerves II through XII grossly intact. Skin: Warm and dry. No rashes or lesions. Extremities: No clubbing or cyanosis. No edema  Data Reviewed: I have personally reviewed following labs and imaging studies  CBC: Recent Labs  Lab 07/06/20 0314 07/06/20 1127 07/07/20 0406 07/07/20 1824 07/08/20 0606 07/09/20 0508 07/10/20 0527  WBC 7.6  --  8.3  --  6.7 5.9 5.4  NEUTROABS 5.0  --  5.8  --  5.1 4.1 3.4  HGB 7.6*   < > 7.4* 6.9* 8.5* 8.5* 8.5*  HCT 26.7*   < > 24.5* 23.1* 28.0* 27.2* 28.1*  MCV 80.9  --  78.3*  --  80.7 80.0 82.4  PLT 216  --  194  --  134* 131* 123*   < > = values in  this interval not displayed.    Basic Metabolic Panel: Recent Labs  Lab 07/06/20 0314 07/06/20 0314 07/07/20 0406 07/07/20 1358 07/08/20 0606 07/09/20 0508 07/10/20 0527  NA 138   < > 140 142 143 144 143  K 4.0   < > 3.3* 3.3* 3.4* 3.0* 3.4*  CL 111   < > 108 108 110 111 108  CO2 16*   < > 20* '23 23 24 24  ' GLUCOSE 78   < > 142* 204* 121* 85 148*  BUN 38*   < > '23 21 16 15 20  ' CREATININE 0.92   < > 0.68 0.75 0.73 0.63 0.68  CALCIUM 7.7*   < > 7.3* 6.9* 7.1* 7.5* 7.6*  MG 2.5*   < > 2.0 2.1 2.1 1.9 2.0  PHOS 1.7*  --  3.0  --  2.6 2.0* 2.3*   < > = values in this interval not displayed.    GFR: Estimated Creatinine Clearance: 67.2 mL/min (by C-G formula based on SCr of 0.68 mg/dL).  Liver Function Tests: Recent Labs  Lab 07/06/20 0314 07/07/20 0406 07/08/20 0606 07/09/20 0508 07/10/20 0527  AST 71* 107* 87* 58* 37  ALT 34 '25 19 16 16  ' ALKPHOS 501* 750* 863* 745* 559*  BILITOT 0.8 0.7 1.6* 0.6 0.9  PROT 6.9 6.5 6.3* 6.4* 6.0*  ALBUMIN 2.1* 2.0* 1.9* 1.9* 1.8*    CBG: Recent Labs  Lab 07/09/20 2000 07/09/20 2338 07/10/20 0517 07/10/20 0843 07/10/20 1218  GLUCAP 191* 178* 143* 138* 192*     Recent Results  (from the past 240 hour(s))  Respiratory Panel by RT PCR (Flu Wynetta Seith&B, Covid) - Nasopharyngeal Swab     Status: None   Collection Time: 07/04/20 10:56 PM   Specimen: Nasopharyngeal Swab  Result Value Ref Range Status   SARS Coronavirus 2 by RT PCR NEGATIVE NEGATIVE Final    Comment: (NOTE) SARS-CoV-2 target nucleic acids are NOT DETECTED.  The SARS-CoV-2 RNA is generally detectable in upper respiratoy specimens during the acute phase of infection. The lowest concentration of SARS-CoV-2 viral copies this assay can detect is 131 copies/mL. Yaritzy Huser negative result does not preclude SARS-Cov-2 infection and should not be used as the sole basis for treatment or other patient management decisions. Aylinn Rydberg negative result may occur with  improper specimen collection/handling, submission of specimen other than nasopharyngeal swab, presence of viral mutation(s) within the areas targeted by this assay, and inadequate number of viral copies (<131 copies/mL). Raymond Bhardwaj negative result must be combined with clinical observations, patient history, and epidemiological information. The expected result is Negative.  Fact Sheet for Patients:  PinkCheek.be  Fact Sheet for Healthcare Providers:  GravelBags.it  This test is no t yet approved or cleared by the [place] FDA and  has been authorized for detection and/or diagnosis of SARS-CoV-2 by FDA under an Emergency Use Authorization (EUA). This EUA will remain  in effect (meaning this test can be used) for the duration of the COVID-19 declaration under Section 564(b)(1) of the Act, 21 U.S.C. section 360bbb-3(b)(1), unless the authorization is terminated or revoked sooner.     Influenza Cheikh Bramble by PCR NEGATIVE NEGATIVE Final   Influenza B by PCR NEGATIVE NEGATIVE Final    Comment: (NOTE) The Xpert Xpress SARS-CoV-2/FLU/RSV assay is intended as an aid in  the diagnosis of influenza from Nasopharyngeal swab specimens  and  should not be used as Tyreece Gelles sole basis for treatment. Nasal washings and  aspirates are unacceptable for Xpert Xpress SARS-CoV-2/FLU/RSV  testing.  Fact Sheet for Patients: PinkCheek.be  Fact Sheet for Healthcare Providers: GravelBags.it  This test is not yet approved or cleared by the [place] FDA and  has been authorized for detection and/or diagnosis of SARS-CoV-2 by  FDA under an Emergency Use Authorization (EUA). This EUA will remain  in effect (meaning this test can be used) for the duration of the  Covid-19 declaration under Section 564(b)(1) of the Act, 21  U.S.C. section 360bbb-3(b)(1), unless the authorization is  terminated or revoked. Performed at [facility], [place] [address], [place], [place] [address]   MRSA PCR Screening     Status: None   Collection Time: 07/05/20  6:08 AM   Specimen: Nasopharyngeal  Result Value Ref Range Status   MRSA by PCR NEGATIVE NEGATIVE Final    Comment:        The GeneXpert MRSA Assay (FDA approved for NASAL specimens only), is one component of Henleigh Robello comprehensive MRSA colonization surveillance program. It is not intended to diagnose MRSA infection nor to guide or monitor treatment for MRSA infections. Performed at [facility], [place] [address], [place], [place] [address]   MRSA PCR Screening     Status: None   Collection Time: 07/08/20  3:00 PM   Specimen: Nasal Mucosa; Nasopharyngeal  Result Value Ref Range Status   MRSA by PCR NEGATIVE NEGATIVE Final    Comment:        The GeneXpert MRSA Assay (FDA approved for NASAL specimens only), is one component of Briellah Baik comprehensive MRSA colonization surveillance program. It is not intended to diagnose MRSA infection nor to guide or monitor treatment for MRSA infections. Performed at [facility], [place] [address], [place], [place] [address]          Radiology  Studies: DG CHEST PORT 1 VIEW  Result Date: 07/09/2020 CLINICAL DATA:  Shortness of breath EXAM: PORTABLE CHEST 1 VIEW COMPARISON:  Yesterday FINDINGS: Borderline heart size accentuated by rotation and Kyo Cocuzza moderate hiatal hernia. Indistinct opacity at the bases, greater on the left. No Kerley lines, effusion, or pneumothorax. IMPRESSION: Decreased lung volumes with increased presumed atelectasis. Electronically Signed   By: Monte Fantasia M.D.   On: 07/09/2020 06:56        Scheduled Meds: . atorvastatin  20 mg Oral Daily  . Chlorhexidine Gluconate Cloth  6 each Topical Daily  . diclofenac Sodium  2 g Topical QID  . doxycycline  100 mg Oral Q12H  . feeding supplement  237 mL Oral BID BM  . insulin aspart  0-9 Units Subcutaneous Q4H  . lidocaine  1 patch Transdermal Q24H  . metoprolol tartrate  25 mg Oral Q8H  . pantoprazole (PROTONIX) IV  40 mg Intravenous Q24H  . potassium chloride  30 mEq Oral Q4H   Continuous Infusions: . cefTRIAXone (ROCEPHIN)  IV 2 g (07/10/20 1246)     LOS: 5 days    Time spent: over 30 min    Fayrene Helper, MD [facility]   To contact the attending provider between 7A-7P or the covering provider during after hours 7P-7A, please log into the web site [email or web address] and access using universal [place] password for that web site. If you do not have the password, please call the hospital operator.  07/10/2020, 4:01 PM

## 2020-07-11 DIAGNOSIS — K567 Ileus, unspecified: Secondary | ICD-10-CM | POA: Diagnosis not present

## 2020-07-11 DIAGNOSIS — E1122 Type 2 diabetes mellitus with diabetic chronic kidney disease: Secondary | ICD-10-CM | POA: Diagnosis not present

## 2020-07-11 DIAGNOSIS — E43 Unspecified severe protein-calorie malnutrition: Secondary | ICD-10-CM

## 2020-07-11 DIAGNOSIS — R627 Adult failure to thrive: Secondary | ICD-10-CM | POA: Diagnosis not present

## 2020-07-11 DIAGNOSIS — R531 Weakness: Secondary | ICD-10-CM | POA: Diagnosis not present

## 2020-07-11 LAB — GLUCOSE, CAPILLARY
Glucose-Capillary: 120 mg/dL — ABNORMAL HIGH (ref 70–99)
Glucose-Capillary: 135 mg/dL — ABNORMAL HIGH (ref 70–99)
Glucose-Capillary: 143 mg/dL — ABNORMAL HIGH (ref 70–99)
Glucose-Capillary: 163 mg/dL — ABNORMAL HIGH (ref 70–99)
Glucose-Capillary: 167 mg/dL — ABNORMAL HIGH (ref 70–99)
Glucose-Capillary: 212 mg/dL — ABNORMAL HIGH (ref 70–99)

## 2020-07-11 LAB — CBC WITH DIFFERENTIAL/PLATELET
Abs Immature Granulocytes: 0.43 10*3/uL — ABNORMAL HIGH (ref 0.00–0.07)
Basophils Absolute: 0 10*3/uL (ref 0.0–0.1)
Basophils Relative: 0 %
Eosinophils Absolute: 0 10*3/uL (ref 0.0–0.5)
Eosinophils Relative: 1 %
HCT: 31.5 % — ABNORMAL LOW (ref 39.0–52.0)
Hemoglobin: 9.2 g/dL — ABNORMAL LOW (ref 13.0–17.0)
Immature Granulocytes: 6 %
Lymphocytes Relative: 12 %
Lymphs Abs: 0.9 10*3/uL (ref 0.7–4.0)
MCH: 24.7 pg — ABNORMAL LOW (ref 26.0–34.0)
MCHC: 29.2 g/dL — ABNORMAL LOW (ref 30.0–36.0)
MCV: 84.5 fL (ref 80.0–100.0)
Monocytes Absolute: 0.6 10*3/uL (ref 0.1–1.0)
Monocytes Relative: 8 %
Neutro Abs: 5.3 10*3/uL (ref 1.7–7.7)
Neutrophils Relative %: 73 %
Platelets: 104 10*3/uL — ABNORMAL LOW (ref 150–400)
RBC: 3.73 MIL/uL — ABNORMAL LOW (ref 4.22–5.81)
RDW: 22 % — ABNORMAL HIGH (ref 11.5–15.5)
WBC: 7.3 10*3/uL (ref 4.0–10.5)
nRBC: 0.5 % — ABNORMAL HIGH (ref 0.0–0.2)

## 2020-07-11 LAB — COMPREHENSIVE METABOLIC PANEL
ALT: 16 U/L (ref 0–44)
AST: 34 U/L (ref 15–41)
Albumin: 1.8 g/dL — ABNORMAL LOW (ref 3.5–5.0)
Alkaline Phosphatase: 494 U/L — ABNORMAL HIGH (ref 38–126)
Anion gap: 9 (ref 5–15)
BUN: 22 mg/dL (ref 8–23)
CO2: 22 mmol/L (ref 22–32)
Calcium: 7.8 mg/dL — ABNORMAL LOW (ref 8.9–10.3)
Chloride: 109 mmol/L (ref 98–111)
Creatinine, Ser: 0.7 mg/dL (ref 0.61–1.24)
GFR, Estimated: >60 mL/min (ref >60)
Glucose, Bld: 166 mg/dL — ABNORMAL HIGH (ref 70–99)
Potassium: 4.5 mmol/L (ref 3.5–5.1)
Sodium: 140 mmol/L (ref 135–145)
Total Bilirubin: 0.7 mg/dL (ref 0.3–1.2)
Total Protein: 6.1 g/dL — ABNORMAL LOW (ref 6.5–8.1)

## 2020-07-11 LAB — MAGNESIUM: Magnesium: 1.9 mg/dL (ref 1.7–2.4)

## 2020-07-11 LAB — HEMOGLOBIN A1C
Hgb A1c MFr Bld: 6.7 % — ABNORMAL HIGH (ref 4.8–5.6)
Mean Plasma Glucose: 146 mg/dL

## 2020-07-11 LAB — PHOSPHORUS: Phosphorus: 2.5 mg/dL (ref 2.5–4.6)

## 2020-07-11 MED ORDER — METOPROLOL TARTRATE 50 MG PO TABS
50.0000 mg | ORAL_TABLET | Freq: Three times a day (TID) | ORAL | Status: DC
  Administered 2020-07-11 – 2020-07-14 (×6): 50 mg via ORAL
  Filled 2020-07-11 (×9): qty 1

## 2020-07-11 MED ORDER — TRAMADOL HCL 50 MG PO TABS
50.0000 mg | ORAL_TABLET | Freq: Four times a day (QID) | ORAL | Status: DC
  Administered 2020-07-11 – 2020-07-15 (×15): 50 mg via ORAL
  Filled 2020-07-11 (×17): qty 1

## 2020-07-11 NOTE — Progress Notes (Signed)
Patient ID: Adrian Singh, male   DOB: 08/09/1934, 86 y.o.   MRN: [id number]  This NP visited patient at the bedside as a follow up to  yesterday's Vincent, and to meet as scheduled with patient's family to include his wife, 2 daughters and son for continued conversation regarding diagnosis, prognosis, goals of care, end-of-life wishes, disposition and options.  Today is day 6 of this hospitalization.  Adrian Singh remains weak with poor p.o. intake.  He is high risk for decompensation.  Patient and family verbalize understanding of the patient's multiple comorbidities and all recognize that he has had continued physical and functional decline over the past several months.  Created space and opportunity for patient and family to explore the thoughts and feelings regarding patient's current medical situation.  Adrian Singh his frustration in his overall weakened state and his desire " get better", however he also is able to verbalize his family he cannot continue to go on like this either.  Education offered regarding concepts of human mortality and adult failure to thrive.  Education offered on the limitations of medical interventions to prolong quality of life when the body begins to fail to thrive.  Education offered on the difference between aggressive medical intervention path and a palliative comfort path for this patient at this time in this situation was discussed.  Plan of care: -Limit to CODE STATUS-patient does not desire intubation -Continue to treat the treatable, patient and family are hopeful for improvement -Transition to skilled nursing facility for rehab when stable -Family is inquiring about possibility of LTAC, discussed with transition of care team and they will notify family of eligibility. -Recommend outpatient community-based palliative care services at facility  Discussed with patient the importance of continued conversation with his family and the medical  providers regarding overall plan of care and treatment options,  ensuring decisions are within the context of the patients values and GOCs.  Questions and concerns addressed   Discussed with Dr Florene Glen  Total time spent on the unit was 60 minutes  Greater than 50% of the time was spent in counseling and coordination of care  Wadie Lessen NP  Palliative Medicine Team Team Phone # [phone] Pager [phone]

## 2020-07-11 NOTE — Care Management Important Message (Signed)
Important Message  Patient Details IM Letter given to the Patient Name: Adrian Singh MRN: [id number] Date of Birth: 1934/05/27   Medicare Important Message Given:  Yes     Kerin Salen 07/11/2020, 12:12 PM

## 2020-07-11 NOTE — Evaluation (Signed)
Clinical/Bedside Swallow Evaluation Patient Details  Name: Adrian Singh MRN: [id number] Date of Birth: 09-15-1934  Today's Date: 07/11/2020 Time: SLP Start Time (ACUTE ONLY): 0954 SLP Stop Time (ACUTE ONLY): 1015 SLP Time Calculation (min) (ACUTE ONLY): 21 min  Past Medical History:  Past Medical History:  Diagnosis Date  . Arthritis    "legs, back" (10/06/2015)  . Atrial fibrillation ([place])    x1  . Atrial fibrillation ([place])   . CAD (coronary artery disease)    nonobstructive  . Cancer ([place])    skin cancer on ear (froze it off) and back (cut it off)  . Dysrhythmia   . Esophageal reflux    hx of  . History of kidney stones   . Hx of heart artery stent   . Hypertrophy of prostate without urinary obstruction and other lower urinary tract symptoms (LUTS)   . Osteoarthrosis, unspecified whether generalized or localized, unspecified site   . Prostate cancer ([employer])   . Skin cancer   . Stroke [facility]) 03/2014   "had a series of mini strokes; maybe 4"; denies residual on 10/06/2015  . Thrombocytopenia ([place])   . Type II diabetes mellitus ([place])    type 2  . Unspecified essential hypertension    Past Surgical History:  Past Surgical History:  Procedure Laterality Date  . BACK SURGERY     2001  . CARDIAC CATHETERIZATION  12/2007  . CARDIAC CATHETERIZATION N/A 09/22/2015   Procedure: Left Heart Cath and Coronary Angiography;  Surgeon: Peter M Martinique, MD;  Location: [facility];  Service: Cardiovascular;  Laterality: N/A;  . CARDIAC CATHETERIZATION N/A 10/06/2015   Procedure: Coronary Stent Intervention;  Surgeon: Peter M Martinique, MD;  Location: [facility];  Service: Cardiovascular;  Laterality: N/A;  . CARDIAC CATHETERIZATION N/A 02/29/2016   Procedure: Left Heart Cath and Coronary Angiography;  Surgeon: Jettie Booze, MD;  Location: [facility];  Service: Cardiovascular;  Laterality: N/A;  . COLONOSCOPY W/ BIOPSIES AND POLYPECTOMY    . CORONARY STENT PLACEMENT   10/06/2015   [name]  with DES  . EAR CYST EXCISION N/A 04/06/2015   Procedure: EXCISION OF SCALP CYST;  Surgeon: Donnie Mesa, MD;  Location: [facility];  Service: General;  Laterality: N/A;  . ESOPHAGOGASTRODUODENOSCOPY (EGD) WITH ESOPHAGEAL DILATION  2001   with dilation  . LUMBAR DISC SURGERY  09/26/1999   "cleaned out arthritis and bone spurs"  . SHOULDER ARTHROSCOPY W/ ROTATOR CUFF REPAIR Left 2005  . THULIUM LASER TURP (TRANSURETHRAL RESECTION OF PROSTATE) N/A 01/21/2017   Procedure: Marcelino Duster LASER TURP (TRANSURETHRAL RESECTION OF PROSTATE) CAUTERIZATION OF BLADDER LESION;  Surgeon: Franchot Gallo, MD;  Location: [facility];  Service: Urology;  Laterality: N/A;   HPI:  Adrian Singh is a 86 y.o. WM PMHx CAD status post stenting, A. 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  Denies any vomiting fever or chills.  Abdominal discomfort is generalized.  Was able to take his medications. CXR 10/22 concerning for LLL pna.  Pt with hx of silent aspiration with MBS 05/04/20  revealing post prandial silent aspiration of all consistencies of liquid.   Assessment / Plan / Recommendation Clinical Impression  Pt present with suspected silent aspiration in setting of pneumonia with known history of dysphagia.  With PO trials today pt required multiple swallows with all all textures and consistencies trialed.  Family reports pt is consuming soft diet at home to improve ease of PO intake. Pt is  known to this service with mosty recent MBSS 05/04/20 revealing severe oropharyngeal dysphagia with esophageal impairments noted as well.  At that time all consistencies of liquid were silently aspirated after the swallow.  Pt was placed on a puree diet with thin liquid at that time.  Suspect silent aspiration continues and is contributing to current pneumonia; however, a repeat MBS may not be beneficial as it will likely not change course or outcome of treatment.  SLP will follow for diet tolerance, and will remain available for further assessment if instrumental testing is desired to inform decision making.  Recommend continuing soft diet with thin liquids.  SLP Visit Diagnosis: Dysphagia, oropharyngeal phase (R13.12)    Aspiration Risk  Severe aspiration risk    Diet Recommendation Dysphagia 3 (Mech soft);Thin liquid   Liquid Administration via: Cup;Straw Medication Administration:  (as tolerated) Supervision: Staff to assist with self feeding Compensations: Slow rate;Small sips/bites;Clear throat intermittently Postural Changes: Seated upright at 90 degrees    Other  Recommendations Oral Care Recommendations: Oral care QID   Follow up Recommendations  (TBD)      Frequency and Duration min 2x/week  2 weeks       Prognosis Prognosis for Safe Diet Advancement: Fair      Swallow Study   General HPI: Adrian Singh is a 86 y.o. WM PMHx CAD status post stenting, A. 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  Denies any vomiting fever or chills.  Abdominal discomfort is generalized.  Was able to take his medications. CXR 10/22 concerning for LLL pna.  Pt with hx of silent aspiration with MBS 05/04/20 revealing post prandial silent aspiration of all consistencies of liquid. Type of Study: Bedside Swallow Evaluation Previous Swallow Assessment: During prior addmission 04/2020 Diet Prior to this Study: Dysphagia 3 (soft);Thin liquids Temperature Spikes Noted: No History of Recent Intubation: No Behavior/Cognition: Alert;Cooperative;Pleasant mood Oral Cavity Assessment: Within Functional Limits Oral Care Completed by SLP: No Oral Cavity - Dentition: Dentures, top;Adequate natural dentition Self-Feeding Abilities: Needs assist Patient Positioning: Upright in bed Baseline Vocal Quality: Normal;Low vocal intensity Volitional Cough: Weak Volitional Swallow: Able to elicit    Oral/Motor/Sensory Function Overall Oral Motor/Sensory Function: Within functional limits Facial Symmetry: Within Functional Limits Lingual ROM:  (reduced protrusion) Lingual Symmetry: Within Functional Limits Lingual Strength: Reduced Velum: Within Functional Limits Mandible: Within Functional Limits   Ice Chips Ice chips: Not tested   Thin Liquid Thin Liquid: Impaired Presentation: Cup Pharyngeal  Phase Impairments: Multiple swallows    Nectar Thick Nectar Thick Liquid: Not tested   Honey Thick Honey Thick Liquid: Not tested   Puree Puree: Impaired Presentation: Spoon Pharyngeal Phase Impairments: Multiple  swallows   Solid     Solid: Impaired Pharyngeal Phase Impairments: Multiple swallows Other Comments: soft solid      Celedonio Savage, MA, [place] Office: [phone]  07/11/2020,11:54 AM

## 2020-07-11 NOTE — Progress Notes (Signed)
Pt alert and aware in bed. Daughter at bedside. Pt states he's doing fair. He talked about his family that he has been married for 79 years. The seventh of ten children. He seemed accepting of his current state and understands that he has one day at a time. The chaplain offered caring and supportive presence and listening ear. The pt favorite hymn is [place] [employer]. I offered prayers and blessings. Further visits will be offered while pt is here.

## 2020-07-11 NOTE — Progress Notes (Signed)
PROGRESS NOTE    Adrian Singh  ENM:[id number] DOB: August 22, 1934 DOA: 07/04/2020 PCP: [name], MD  Chief Complaint  Patient presents with  . Abdominal Pain   Brief Narrative:  Adrian Singh Adrian Singh 86 y.o.WM PMHx CAD status post stenting, [name]. 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 Denies any vomiting fever or chills. Abdominal discomfort is generalized. Was able to take his medications.  Assessment & Plan:   Principal Problem:   Ileus ([place]) Active Problems:   Essential hypertension   Uncontrolled type 2 diabetes mellitus with diabetic polyneuropathy, without long-term current use of insulin ([name])   CKD stage 3 due to type 2 diabetes mellitus ([place])   History of CVA (cerebrovascular accident)   CAD S/P percutaneous coronary angioplasty   Prostate cancer ([place])   Weakness generalized   Elevated troponin   Pressure injury of skin   Severe protein-calorie malnutrition ([place])   Adult failure to thrive   Hypokalemia   Atrial fibrillation with RVR ([facility])  Ileus - CT scan consistent with ileus -- KUB 10/21 with stable diffuse gaseous distension -- continued abdominal distension today, though improved -- will consider repeat CT scan if patient with worsening pain/symptoms -- replace mag/K as needed to keep >2/4 respectively -- seems to be improving, continue soft diet -- will discuss with GI, appreciate recommendations (see 10/24 GI note)  Vashon Arch  Fib with RVR -Cardizem drip - continue while NPO - increase metop as tolerated -Heparin drip per pharmacy some hematuria noted 10/21, was on hold - per cards, plan to d/c outpatient xarelto   Elevated troponin  Chest Pain  Coronary Artery Disease s/p stenting -- c/o intermittent chest discomfort 10/20, unclear etiology, he c/o diffuse pain at times.  Troponin was elevated, but relatively flat, not c/w ACS and EKG was without changes concerning for ACS. -- echo with EF to 40-45%, diffuse hypokinesis and akinesis in basal and mid inferior and inferolateral walls -- will consult cardiology given decreased EF -> cards recommending atoravastatin, metoprolol succinate 25 mg daily -- will continue to monitor, asymptomatic today  HCAP Start abx with ceftriaxone/doxy for now (10/22 - present) SLP eval for possible aspiration -> recommending dysphagia 3, thin liquid Follow MRSA and sputum cx if able Repeat CXR 10/23 am with decreased lung volumes with increased atelectasis  Hypokalemia  Hypophosphatemia - replace and follow   Elevated LFT's  Elevated Alk Phos:  - normal bilirubin, GGT wnl -> suspect alk phos 2/2 bone mets - Appreciate Gi assistance, will continue to follow, may need additional imaging  Chronic pain syndrome -Morphine PRN --at home is on tramadol per med rec  Chronic systolic CHF -Strict in and out -Daily weight  --caution with IVF, takes 40 mg lasix daily as need at home --echo with decreased EF 40-45% with diffuse hypokinesis, appreciate cardiology recs  Chronic anemia  Thrombocytopenia -labs suggestive of AOCD -- s/p 1 unit pRBC -- stable today - hypoprolieferative retics - continue to trend  Hematuria - while on heparin gtt, follow UA - hold heparin for now - UA with >  59 RBC's, urine appears clear this AM - follow outpatient   NAGMA - improved, continue to monitor  DM type II controlled with complication; DM gastroparesis  Hypoglycemia -10/6  hemoglobin A1c= 6.5 -Sensitive SSI -- follow with D5 containing fluids  Metastatic prostate cancer  Hx CVA with residual left sided weakness and dysarthria Has abnormal MRI brain from 04/2020 and short term follow up was recommended Will order repeat MRI with contrast  Thrombocytopenia Continue to monitor, downtrending  Goals of Care: Will c/s palliative care to assist with [place] conversations.  Discussed this with pt and daughter at bedside 10/23. Appreciate palliative care c/s -planning for limited code status, plan for SNF with rehab, outpatient pallaitive care  DVT prophylaxis: heparin  Code Status: full  Family Communication: none at bedside - daughter 10/23 Disposition:   Status is: Inpatient  Remains inpatient appropriate because:Inpatient level of care appropriate due to severity of illness   Dispo: The patient is from: Home              Anticipated d/c is to: pending              Anticipated d/c date is: > 3 days              Patient currently is not medically stable to d/c.    Consultants:   GI  Procedures:  none  Antimicrobials:  Anti-infectives (From admission, onward)   Start     Dose/Rate Route Frequency Ordered Stop   07/08/20 1230  cefTRIAXone (ROCEPHIN) 2 g in sodium chloride 0.9 % 100 mL IVPB        2 g 200 mL/hr over 30 Minutes Intravenous Every 24 hours 07/08/20 1141 07/13/20 1229   07/08/20 1230  doxycycline (VIBRA-TABS) tablet 100 mg        100 mg Oral Every 12 hours 07/08/20 1141       Subjective: No new complaints  Objective: Vitals:   07/11/20 0307 07/11/20 0401 07/11/20 0413 07/11/20 1214  BP: 125/77 108/79  113/79  Pulse: (!) 110 (!) 59  99  Resp:  16  20  Temp:  97.7 F (36.5 C)  97.7 F (36.5 C)  TempSrc:  Oral  Oral  SpO2:  100%  97%  Weight:   68.8 kg   Height:        Intake/Output Summary (Last 24 hours) at 07/11/2020 1749 Last data filed at 07/11/2020 0920 Gross per 24 hour  Intake 360 ml  Output --  Net 360 ml    Filed Weights   07/09/20 0500 07/10/20 0500 07/11/20 0413  Weight: 66.6 kg 67.9 kg 68.8 kg    Examination:  General: No acute distress. Cardiovascular: irregularly irregular, tachy Lungs: Clear to auscultation bilaterally Abdomen: Soft, nontender, nondistended Neurological: Alert and oriented 3. Moves all extremities 4Cranial nerves II through XII grossly intact. Skin: Warm and dry. No rashes or lesions. Extremities: No clubbing or cyanosis. No edema.   Data Reviewed: I have personally reviewed following labs and imaging studies  CBC: Recent Labs  Lab 07/07/20 0406 07/07/20 0406 07/07/20 1824 07/08/20 0606 07/09/20 0508 07/10/20 0527 07/11/20 0442  WBC 8.3  --   --  6.7 5.9 5.4 7.3  NEUTROABS 5.8  --   --  5.1 4.1 3.4 5.3  HGB 7.4*   < > 6.9* 8.5* 8.5* 8.5* 9.2*  HCT 24.5*   < > 23.1* 28.0* 27.2* 28.1* 31.5*  MCV 78.3*  --   --  80.7 80.0 82.4 84.5  PLT 194  --   --  134* 131* 123* 104*   < > = values in this interval not displayed.    Basic Metabolic Panel: Recent Labs  Lab 07/07/20 0406 07/07/20 0406 07/07/20 1358 07/08/20 0606 07/09/20 0508 07/10/20 0527 07/11/20 0442  NA 140   < > 142 143 144 143 140  K 3.3*   < > 3.3* 3.4* 3.0* 3.4* 4.5  CL 108   < > 108 110 111 108 109  CO2 20*   < > _0 GLUCOSE 142*   < > 204* 121* 85 148* 166*  BUN 23   < > _1 CREATININE 0.68   < > 0.75 0.73 0.63 0.68 0.70  CALCIUM 7.3*   < > 6.9* 7.1* 7.5* 7.6* 7.8*  MG 2.0   < > 2.1 2.1 1.9 2.0 1.9  PHOS 3.0  --   --  2.6 2.0* 2.3* 2.5   < > = values in this interval not displayed.    GFR: Estimated Creatinine Clearance: 68.1 mL/min (by C-G formula based on SCr of 0.7 mg/dL).  Liver Function Tests: Recent Labs  Lab 07/07/20 0406 07/08/20 0606 07/09/20 0508 07/10/20 0527 07/11/20 0442  AST 107* 87* 58* 37 34  ALT _2 ALKPHOS 750* 863* 745* 559* 494*  BILITOT 0.7 1.6* 0.6 0.9 0.7  PROT 6.5 6.3* 6.4* 6.0* 6.1*  ALBUMIN 2.0*  1.9* 1.9* 1.8* 1.8*    CBG: Recent Labs  Lab 07/11/20 0005 07/11/20 0403 07/11/20 0757 07/11/20 1208 07/11/20 1615  GLUCAP 163* 143* 135* 212* 167*     Recent Results (from the past 240 hour(s))  Respiratory Panel by RT PCR (Flu Dillon Livermore&B, Covid) - Nasopharyngeal Swab     Status: None   Collection Time: 07/04/20 10:56 PM   Specimen: Nasopharyngeal Swab  Result Value Ref Range Status   SARS Coronavirus 2 by RT PCR NEGATIVE NEGATIVE Final    Comment: (NOTE) SARS-CoV-2 target nucleic acids are NOT DETECTED.  The SARS-CoV-2 RNA is generally detectable in upper respiratoy specimens during the acute phase of infection. The lowest concentration of SARS-CoV-2 viral copies this assay can detect is 131 copies/mL. Aylan Bayona negative result does not preclude SARS-Cov-2 infection and should not be used as the sole basis for treatment or other patient management decisions. Mylee Falin negative result may occur with  improper specimen collection/handling, submission of specimen other than nasopharyngeal swab, presence of viral mutation(s) within the areas targeted by this assay, and inadequate number of viral copies (<131 copies/mL). Chidiebere Wynn negative result must be combined with clinical observations, patient history, and epidemiological information. The expected result is Negative.  Fact Sheet for Patients:  PinkCheek.be  Fact Sheet for Healthcare Providers:  GravelBags.it  This test is no t yet approved or cleared by the [place] FDA and  has been authorized for detection and/or diagnosis of SARS-CoV-2 by FDA under an Emergency Use Authorization (EUA). This EUA will remain  in effect (meaning this test can be used) for the duration of the COVID-19 declaration under Section 564(b)(1) of the Act, 21 U.S.C. section 360bbb-3(b)(1), unless the authorization is terminated or revoked sooner.     Influenza Zanasia Hickson by PCR NEGATIVE NEGATIVE Final   Influenza B  by PCR NEGATIVE NEGATIVE Final    Comment: (NOTE) The Xpert Xpress SARS-CoV-2/FLU/RSV assay is intended as an aid in  the diagnosis of influenza from Nasopharyngeal swab specimens and  should not  be used as Nicholous Girgenti sole basis for treatment. Nasal washings and  aspirates are unacceptable for Xpert Xpress SARS-CoV-2/FLU/RSV  testing.  Fact Sheet for Patients: PinkCheek.be  Fact Sheet for Healthcare Providers: GravelBags.it  This test is not yet approved or cleared by the [place] FDA and  has been authorized for detection and/or diagnosis of SARS-CoV-2 by  FDA under an Emergency Use Authorization (EUA). This EUA will remain  in effect (meaning this test can be used) for the duration of the  Covid-19 declaration under Section 564(b)(1) of the Act, 21  U.S.C. section 360bbb-3(b)(1), unless the authorization is  terminated or revoked. Performed at [facility], [place] [address] [place], [place] [address]   MRSA PCR Screening     Status: None   Collection Time: 07/05/20  6:08 AM   Specimen: Nasopharyngeal  Result Value Ref Range Status   MRSA by PCR NEGATIVE NEGATIVE Final    Comment:        The GeneXpert MRSA Assay (FDA approved for NASAL specimens only), is one component of Robbi Scurlock comprehensive MRSA colonization surveillance program. It is not intended to diagnose MRSA infection nor to guide or monitor treatment for MRSA infections. Performed at [facility], [place] [address] [place], [place] [address]   MRSA PCR Screening     Status: None   Collection Time: 07/08/20  3:00 PM   Specimen: Nasal Mucosa; Nasopharyngeal  Result Value Ref Range Status   MRSA by PCR NEGATIVE NEGATIVE Final    Comment:        The GeneXpert MRSA Assay (FDA approved for NASAL specimens only), is one component of Caitlyn Buchanan comprehensive MRSA colonization surveillance program. It is not intended to diagnose  MRSA infection nor to guide or monitor treatment for MRSA infections. Performed at [facility], [place] [address] [place], [place] [address]          Radiology Studies: No results found.      Scheduled Meds: . atorvastatin  20 mg Oral Daily  . Chlorhexidine Gluconate Cloth  6 each Topical Daily  . diclofenac Sodium  2 g Topical QID  . doxycycline  100 mg Oral Q12H  . feeding supplement  237 mL Oral BID BM  . insulin aspart  0-9 Units Subcutaneous Q4H  . lidocaine  1 patch Transdermal Q24H  . metoprolol tartrate  50 mg Oral Q8H  . pantoprazole (PROTONIX) IV  40 mg Intravenous Q24H  . traMADol  50 mg Oral Q6H   Continuous Infusions: . cefTRIAXone (ROCEPHIN)  IV 2 g (07/11/20 1255)     LOS: 6 days    Time spent: over 30 min    Fayrene Helper, MD [facility]   To contact the attending provider between 7A-7P or the covering provider during after hours 7P-7A, please log into the web site [email or web address] and access using universal [place] password for that web site. If you do not have the password, please call the hospital operator.  07/11/2020, 5:49 PM

## 2020-07-11 NOTE — TOC Progression Note (Signed)
Transition of Care [facility]) - Progression Note    Patient Details  Name: Adrian Singh MRN: [id number] Date of Birth: September 11, 1934  [facility] CM/SW Contact  Purcell Mouton, RN Phone Number: 07/11/2020, 4:46 PM  Clinical Narrative:    Asked [employer] rep to review pt at family request.    Expected Discharge Plan: [place] Barriers to Discharge: Barriers Unresolved (comment) (a.fib and ielus)  Expected Discharge Plan and Services Expected Discharge Plan: [place]   Discharge Planning Services: CM Consult Post Acute Care Choice: [place] Living arrangements for the past 2 months: [place] Expected Discharge Date:  (unknown)                                     Social Determinants of Health (SDOH) Interventions    Readmission Risk Interventions Readmission Risk Prevention Plan 06/20/2020 06/11/2020  Transportation Screening Complete Complete  Medication Review Press photographer) Complete Complete  PCP or Specialist appointment within 3-5 days of discharge Complete -  HRI or Home Care Consult Complete -  SW Recovery Care/Counseling Consult Complete Complete  Palliative Care Screening Not Applicable Not Applicable  Skilled Nursing Facility Complete Complete  Some recent data might be hidden

## 2020-07-12 ENCOUNTER — Other Ambulatory Visit: Payer: Self-pay | Admitting: Radiation Therapy

## 2020-07-12 ENCOUNTER — Inpatient Hospital Stay (HOSPITAL_COMMUNITY): Payer: Medicare Other

## 2020-07-12 DIAGNOSIS — K567 Ileus, unspecified: Secondary | ICD-10-CM | POA: Diagnosis not present

## 2020-07-12 LAB — COMPREHENSIVE METABOLIC PANEL
ALT: 20 U/L (ref 0–44)
AST: 36 U/L (ref 15–41)
Albumin: 1.7 g/dL — ABNORMAL LOW (ref 3.5–5.0)
Alkaline Phosphatase: 457 U/L — ABNORMAL HIGH (ref 38–126)
Anion gap: 11 (ref 5–15)
BUN: 23 mg/dL (ref 8–23)
CO2: 21 mmol/L — ABNORMAL LOW (ref 22–32)
Calcium: 7.9 mg/dL — ABNORMAL LOW (ref 8.9–10.3)
Chloride: 110 mmol/L (ref 98–111)
Creatinine, Ser: 0.72 mg/dL (ref 0.61–1.24)
GFR, Estimated: >60 mL/min (ref >60)
Glucose, Bld: 111 mg/dL — ABNORMAL HIGH (ref 70–99)
Potassium: 4.1 mmol/L (ref 3.5–5.1)
Sodium: 142 mmol/L (ref 135–145)
Total Bilirubin: 0.5 mg/dL (ref 0.3–1.2)
Total Protein: 6 g/dL — ABNORMAL LOW (ref 6.5–8.1)

## 2020-07-12 LAB — PHOSPHORUS: Phosphorus: 3 mg/dL (ref 2.5–4.6)

## 2020-07-12 LAB — CBC WITH DIFFERENTIAL/PLATELET
Abs Immature Granulocytes: 0.53 10*3/uL — ABNORMAL HIGH (ref 0.00–0.07)
Basophils Absolute: 0 10*3/uL (ref 0.0–0.1)
Basophils Relative: 0 %
Eosinophils Absolute: 0.1 10*3/uL (ref 0.0–0.5)
Eosinophils Relative: 1 %
HCT: 30.3 % — ABNORMAL LOW (ref 39.0–52.0)
Hemoglobin: 8.8 g/dL — ABNORMAL LOW (ref 13.0–17.0)
Immature Granulocytes: 7 %
Lymphocytes Relative: 16 %
Lymphs Abs: 1.2 10*3/uL (ref 0.7–4.0)
MCH: 24.3 pg — ABNORMAL LOW (ref 26.0–34.0)
MCHC: 29 g/dL — ABNORMAL LOW (ref 30.0–36.0)
MCV: 83.7 fL (ref 80.0–100.0)
Monocytes Absolute: 0.5 10*3/uL (ref 0.1–1.0)
Monocytes Relative: 7 %
Neutro Abs: 5.1 10*3/uL (ref 1.7–7.7)
Neutrophils Relative %: 69 %
Platelets: 110 10*3/uL — ABNORMAL LOW (ref 150–400)
RBC: 3.62 MIL/uL — ABNORMAL LOW (ref 4.22–5.81)
RDW: 21.6 % — ABNORMAL HIGH (ref 11.5–15.5)
WBC: 7.5 10*3/uL (ref 4.0–10.5)
nRBC: 0.8 % — ABNORMAL HIGH (ref 0.0–0.2)

## 2020-07-12 LAB — GLUCOSE, CAPILLARY
Glucose-Capillary: 100 mg/dL — ABNORMAL HIGH (ref 70–99)
Glucose-Capillary: 101 mg/dL — ABNORMAL HIGH (ref 70–99)
Glucose-Capillary: 105 mg/dL — ABNORMAL HIGH (ref 70–99)
Glucose-Capillary: 159 mg/dL — ABNORMAL HIGH (ref 70–99)
Glucose-Capillary: 183 mg/dL — ABNORMAL HIGH (ref 70–99)
Glucose-Capillary: 216 mg/dL — ABNORMAL HIGH (ref 70–99)

## 2020-07-12 LAB — MAGNESIUM: Magnesium: 1.9 mg/dL (ref 1.7–2.4)

## 2020-07-12 MED ORDER — AMIODARONE HCL 200 MG PO TABS
200.0000 mg | ORAL_TABLET | Freq: Two times a day (BID) | ORAL | Status: DC
  Administered 2020-07-12 – 2020-07-15 (×7): 200 mg via ORAL
  Filled 2020-07-12 (×7): qty 1

## 2020-07-12 MED ORDER — DIGOXIN 0.25 MG/ML IJ SOLN
0.1250 mg | Freq: Once | INTRAMUSCULAR | Status: AC
  Administered 2020-07-12: 0.125 mg via INTRAVENOUS
  Filled 2020-07-12: qty 2

## 2020-07-12 MED ORDER — BOOST / RESOURCE BREEZE PO LIQD CUSTOM
1.0000 | ORAL | Status: DC
  Administered 2020-07-12 – 2020-07-14 (×3): 1 via ORAL

## 2020-07-12 MED ORDER — DIGOXIN 0.25 MG/ML IJ SOLN
0.2500 mg | Freq: Every day | INTRAMUSCULAR | Status: AC
  Administered 2020-07-12: 0.25 mg via INTRAVENOUS
  Filled 2020-07-12 (×2): qty 2

## 2020-07-12 MED ORDER — GADOBUTROL 1 MMOL/ML IV SOLN
6.0000 mL | Freq: Once | INTRAVENOUS | Status: AC | PRN
  Administered 2020-07-12: 6 mL via INTRAVENOUS

## 2020-07-12 NOTE — TOC Progression Note (Signed)
Transition of Care [facility]) - Progression Note    Patient Details  Name: Adrian Singh MRN: [id number] Date of Birth: 1934/06/03  Transition of Care [facility]) CM/SW Contact  Purcell Mouton, RN Phone Number: 07/12/2020, 10:30 AM  Clinical Narrative:    Spoke with daughter [name] concerning [name], pt did qualify however, pt's insurance may not give authorization. [name] asked if it could be appealed. Explained that if [employer] ask for a peer to peer. Then attending MD will do the peer to peer. [name] asked about in pt rehab for pt. Pt would not qualify for in pt rehab related to 3 to 5 hours of rehab a day.    Expected Discharge Plan: [place] Barriers to Discharge: Barriers Unresolved (comment) (a.fib and ielus)  Expected Discharge Plan and Services Expected Discharge Plan: [place]   Discharge Planning Services: CM Consult Post Acute Care Choice: [place] Living arrangements for the past 2 months: [place] Expected Discharge Date:  (unknown)                                     Social Determinants of Health (SDOH) Interventions    Readmission Risk Interventions Readmission Risk Prevention Plan 06/20/2020 06/11/2020  Transportation Screening Complete Complete  Medication Review Press photographer) Complete Complete  PCP or Specialist appointment within 3-5 days of discharge Complete -  [place] or Home Care Consult Complete -  SW Recovery Care/Counseling Consult Complete Complete  Palliative Care Screening Not Applicable Not Applicable  Skilled Nursing Facility Complete Complete  Some recent data might be hidden

## 2020-07-12 NOTE — Progress Notes (Signed)
  Speech Language Pathology Treatment: Dysphagia  Patient Details Name: Adrian Singh MRN: [id number] DOB: 1933/11/28 Today's Date: 07/12/2020 Time: 1355-1450 SLP Time Calculation (min) (ACUTE ONLY): 55 min  Assessment / Plan / Recommendation Clinical Impression  Pt seen at bedside following BSE completed Monday 07/11/20. Pt's daughter was present during this session. Pt was resting in bed, but awakened easily to verbal stim. Speech was initially difficult to understand due to dry mouth, minimal mouth opening, and rapid rate of speech. SLP reviewed MBS results from August 2021, which indicated silent aspiration and high risk of aspiration across consistencies. In addition to oral and pharyngeal dysphagia, pt has a history of esophageal dysmotility with exacerbates his aspiration risk. Pt was observed during lunch. He had difficulty masticating pot roast, but tolerated mashed potatoes and magic cup. Will downgrade diet to dys 2 (finely chopped) to maximize PO intake and minimize fatigue. SLP reviewed safe swallow precautions and behavioral and dietary strategies for management of esophageal dysmotility:  Behavioral and Dietary Strategies for Management of Esophageal Dysmotility 1. Take reflux medications 30+ minutes before other po intake, in the morning 2. Begin meals with warm beverage 3. Eat smaller more frequent meals 4. Eat slowly, taking small bites and sips 5. Alternate solids and liquids 6. Avoid foods/liquids that increase acid production 7. Sit upright during and for 30+ minutes after meals to facilitate esophageal clearing  SLP provided opportunity for pt/dtr to ask questions. Safe swallow precautions posted at [facility], and written esophageal information left with dtr. SLP will follow to complete education.    HPI HPI: Adrian Singh is a 86 y.o. WM PMHx CAD status post stenting, A. fib, diabetes mellitus type 2 controlled with complication, chronic anemia, Hx CVA with residual  dysarthria and left-sided weakness Hx metastatic prostate cancer on Xtandi, orthostatic hypotension/presyncope recently admitted 10 days ago for orthostatic hypotension at that time patient had some medication changes (admitted to the hospital 9/23--9/27 orthostatic hypotension), (admitted hospital 10 2--10/ 8 orthostatic hypotension) Presents to the ER with complaints of 2 days of increasing abdominal discomfort with distention last bowel movement was more than 24 hours ago.  Denies any vomiting fever or chills.  Abdominal discomfort is generalized.  Was able to take his medications. CXR 10/22 concerning for LLL pna.  Pt with hx of silent aspiration with MBS 05/04/20 revealing post prandial silent aspiration of all consistencies of liquid.      SLP Plan  Continue with current plan of care       Recommendations  Diet recommendations: Dysphagia 2 (fine chop);Thin liquid Liquids provided via: Cup;Straw Medication Administration: Other (Comment) (as tolerated) Supervision: Patient able to self feed;Full supervision/cueing for compensatory strategies;Staff to assist with self feeding Compensations: Slow rate;Small sips/bites;Clear throat intermittently Postural Changes and/or Swallow Maneuvers: Seated upright 90 degrees;Upright 30-60 min after meal                Oral Care Recommendations: Oral care QID Follow up Recommendations: Skilled Nursing facility SLP Visit Diagnosis: Dysphagia, oropharyngeal phase (R13.12) Plan: Continue with current plan of care       [place] [facility] [place], [facility], [place] Speech Language Pathologist Office: [phone] Pager: [phone]  Shonna Chock 07/12/2020, 2:58 PM

## 2020-07-12 NOTE — NC FL2 (Signed)
[place] LEVEL OF CARE SCREENING TOOL     IDENTIFICATION  Patient Name: Adrian Singh Birthdate: [birth year] Sex: male Admission Date (Current Location): 07/04/2020  [facility] and [place] Number:  Herbalist and Address:  [facility],  [name], [place]      Provider Number: [id number]  Attending Physician Name and Address:  Elodia Florence., *  Relative Name and Phone Number:  Embry Huss wife [phone]    Current Level of Care: Hospital Recommended Level of Care: [place] Prior Approval Number:    Date Approved/Denied:   PASRR Number: [id number]  Discharge Plan: SNF    Current Diagnoses: Patient Active Problem List   Diagnosis Date Noted  . Ileus ([place]) 07/05/2020  . Hypokalemia 07/05/2020  . Atrial fibrillation with RVR ([place]) 07/05/2020  . Postural dizziness with presyncope 06/09/2020  . Severe protein-calorie malnutrition ([place]) 05/06/2020  . Palliative care by specialist   . Goals of care, counseling/discussion   . DNR (do not resuscitate) discussion   . Adult failure to thrive   . Need for emotional support   . Pressure injury of skin 05/04/2020  . Stroke ([place]) 05/02/2020  . Weakness generalized 04/30/2020  . Fall at home, initial encounter 04/30/2020  . Acute on chronic anemia 04/30/2020  . Elevated CK 04/30/2020  . Chronic kidney disease, stage 3a ([place]) 04/30/2020  . Elevated troponin 04/30/2020  . Fatigue 04/29/2020  . Malnutrition of mild degree ([place]) 04/06/2019  . Diabetic foot ulcer ([place]) 04/06/2019  . Mood disorder ([place]) 04/06/2019  . Aortic atherosclerosis ([place]) 02/13/2017  . Prostate cancer ([place]) 02/13/2017  . Primary malignant neoplasm of prostate metastatic to bone ([place]) 02/13/2017  . Orthostatic hypotension 03/19/2016  . CAD S/P percutaneous coronary angioplasty 02/28/2016  . Atherosclerosis of native coronary artery of native heart with angina pectoris  ([place])   . Abnormal nuclear stress test 09/22/2015  . Thrombocytopenia ([place])   . Advanced directives, counseling/discussion 09/27/2014  . PAF (paroxysmal atrial fibrillation) ([place]) 04/12/2014  . History of CVA (cerebrovascular accident) 03/31/2014  . CKD stage 3 due to type 2 diabetes mellitus ([place])   . Uncontrolled type 2 diabetes mellitus with diabetic polyneuropathy, without long-term current use of insulin ([place]) 11/05/2011  . Routine general medical examination at a health care facility 05/07/2011  . Essential hypertension 12/23/2009  . GERD 12/23/2009  . BPH (benign prostatic hyperplasia) 12/23/2009  . OSTEOARTHRITIS 12/23/2009    Orientation RESPIRATION BLADDER Height & Weight     Self, Time, Place, Situation  Normal External catheter Weight: 68.9 kg Height:  5\' 11"  (180.3 cm)  BEHAVIORAL SYMPTOMS/MOOD NEUROLOGICAL BOWEL NUTRITION STATUS      Continent Diet (Dysphagia)  AMBULATORY STATUS COMMUNICATION OF NEEDS Skin   Extensive Assist Verbally Skin abrasions (Ecchymosis, abrasion back, mid, bilateral arm)                       Personal Care Assistance Level of Assistance  Bathing, Feeding, Dressing Bathing Assistance: Maximum assistance Feeding assistance: Limited assistance Dressing Assistance: Maximum assistance     Functional Limitations Info  Sight, Hearing, Speech Sight Info: Impaired Hearing Info: Adequate Speech Info: Adequate    SPECIAL CARE FACTORS FREQUENCY  PT (By licensed PT), OT (By licensed OT)     PT Frequency: 5x week OT Frequency: 5x week            Contractures Contractures Info: Not present    Additional  Factors Info  Code Status, Allergies Code Status Info: FULL Allergies Info: Soma Carisoprodol, Doxazosin           Current Medications (07/12/2020):  This is the current hospital active medication list Current Facility-Administered Medications  Medication Dose Route Frequency Provider Last Rate Last Admin  . acetaminophen  (TYLENOL) tablet 650 mg  650 mg Oral Q6H PRN Rise Patience, MD   650 mg at 07/10/20 1704   Or  . acetaminophen (TYLENOL) suppository 650 mg  650 mg Rectal Q6H PRN Rise Patience, MD      . amiodarone (PACERONE) tablet 200 mg  200 mg Oral BID Elodia Florence., MD   200 mg at 07/12/20 1508  . atorvastatin (LIPITOR) tablet 20 mg  20 mg Oral Daily Elodia Florence., MD   20 mg at 07/12/20 0953  . benzonatate (TESSALON) capsule 100 mg  100 mg Oral TID PRN Elodia Florence., MD   100 mg at 07/11/20 0411  . Chlorhexidine Gluconate Cloth 2 % PADS 6 each  6 each Topical Daily Rise Patience, MD   6 each at 07/12/20 1028  . diclofenac Sodium (VOLTAREN) 1 % topical gel 2 g  2 g Topical QID Elodia Florence., MD   2 g at 07/12/20 1254  . doxycycline (VIBRA-TABS) tablet 100 mg  100 mg Oral Q12H Elodia Florence., MD   100 mg at 07/12/20 2202  . feeding supplement (BOOST / RESOURCE BREEZE) liquid 1 Container  1 Container Oral Q24H Elodia Florence., MD      . feeding supplement (ENSURE ENLIVE / ENSURE PLUS) liquid 237 mL  237 mL Oral BID BM Allie Bossier, MD   237 mL at 07/12/20 0953  . hydrALAZINE (APRESOLINE) injection 5 mg  5 mg Intravenous Q6H PRN Elodia Florence., MD      . insulin aspart (novoLOG) injection 0-9 Units  0-9 Units Subcutaneous Q4H Rise Patience, MD   2 Units at 07/12/20 1257  . lidocaine (LIDODERM) 5 % 1 patch  1 patch Transdermal Q24H Elodia Florence., MD   1 patch at 07/11/20 1600  . metoprolol tartrate (LOPRESSOR) injection 5 mg  5 mg Intravenous Q6H PRN Elodia Florence., MD      . metoprolol tartrate (LOPRESSOR) tablet 50 mg  50 mg Oral Q8H Elodia Florence., MD   50 mg at 07/11/20 1500  . morphine 2 MG/ML injection 2 mg  2 mg Intravenous Q4H PRN Elodia Florence., MD   2 mg at 07/07/20 1834  . nitroGLYCERIN (NITROSTAT) SL tablet 0.4 mg  0.4 mg Sublingual Q5 min PRN Elodia Florence., MD      .  ondansetron [facility]) injection 4 mg  4 mg Intravenous Q8H PRN Lovey Newcomer T, NP   4 mg at 07/10/20 2016  . pantoprazole (PROTONIX) injection 40 mg  40 mg Intravenous Q24H Carl Best M, NP   40 mg at 07/12/20 1203  . phenol (CHLORASEPTIC) mouth spray 1 spray  1 spray Mouth/Throat PRN Elodia Florence., MD   1 spray at 07/10/20 1445  . traMADol (ULTRAM) tablet 50 mg  50 mg Oral Q6H Elodia Florence., MD   50 mg at 07/12/20 1203     Discharge Medications: Please see discharge summary for a list of discharge medications.  Relevant Imaging Results:  Relevant Lab Results:   Additional Information RK#[id number]  Purcell Mouton, RN

## 2020-07-12 NOTE — NC FL2 (Signed)
[place] LEVEL OF CARE SCREENING TOOL     IDENTIFICATION  Patient Name: Adrian Singh Birthdate: Nov 25, 1933 Sex: male Admission Date (Current Location): 07/04/2020  [facility] and [place] Number:  Herbalist and Address:  [facility],  [place], [place]      Provider Number: [id number]  Attending Physician Name and Address:  Elodia Florence., *  Relative Name and Phone Number:  [name] wife [phone]    Current Level of Care: Hospital Recommended Level of Care: [place] Prior Approval Number:    Date Approved/Denied:   PASRR Number: [id number]  Discharge Plan: SNF    Current Diagnoses: Patient Active Problem List   Diagnosis Date Noted  . Ileus ([place]) 07/05/2020  . Hypokalemia 07/05/2020  . Atrial fibrillation with RVR ([place]) 07/05/2020  . Postural dizziness with presyncope 06/09/2020  . Severe protein-calorie malnutrition ([place]) 05/06/2020  . Palliative care by specialist   . Goals of care, counseling/discussion   . DNR (do not resuscitate) discussion   . Adult failure to thrive   . Need for emotional support   . Pressure injury of skin 05/04/2020  . Stroke ([place]) 05/02/2020  . Weakness generalized 04/30/2020  . Fall at home, initial encounter 04/30/2020  . Acute on chronic anemia 04/30/2020  . Elevated CK 04/30/2020  . Chronic kidney disease, stage 3a ([place]) 04/30/2020  . Elevated troponin 04/30/2020  . Fatigue 04/29/2020  . Malnutrition of mild degree ([place]) 04/06/2019  . Diabetic foot ulcer ([place]) 04/06/2019  . Mood disorder ([place]) 04/06/2019  . Aortic atherosclerosis ([place]) 02/13/2017  . Prostate cancer ([place]) 02/13/2017  . Primary malignant neoplasm of prostate metastatic to bone ([place]) 02/13/2017  . Orthostatic hypotension 03/19/2016  . CAD S/P percutaneous coronary angioplasty 02/28/2016  . Atherosclerosis of native coronary artery of native heart with angina pectoris  ([place])   . Abnormal nuclear stress test 09/22/2015  . Thrombocytopenia ([place])   . Advanced directives, counseling/discussion 09/27/2014  . PAF (paroxysmal atrial fibrillation) ([place]) 04/12/2014  . History of CVA (cerebrovascular accident) 03/31/2014  . 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 Uncontrolled type 2 diabetes mellitus with diabetic polyneuropathy, without long-term current use of insulin ([place]) 11/05/2011  . Routine general medical examination at a health care facility 05/07/2011  . Essential hypertension 12/23/2009  . GERD 12/23/2009  . BPH (benign prostatic hyperplasia) 12/23/2009  . OSTEOARTHRITIS 12/23/2009    Orientation RESPIRATION BLADDER Height & Weight     Self, Time, Place, Situation  Normal External catheter Weight: 68.9 kg Height:  5\' 11"  (180.3 cm)  BEHAVIORAL SYMPTOMS/MOOD NEUROLOGICAL BOWEL NUTRITION STATUS      Continent Diet (Dysphagia)  AMBULATORY STATUS COMMUNICATION OF NEEDS Skin   Extensive Assist Verbally Skin abrasions (Ecchymosis, abrasion back, mid, bilateral arm)                       Personal Care Assistance Level of Assistance  Bathing, Feeding, Dressing Bathing Assistance: Maximum assistance Feeding assistance: Limited assistance Dressing Assistance: Maximum assistance     Functional Limitations Info  Sight, Hearing, Speech Sight Info: Impaired Hearing Info: Adequate Speech Info: Adequate    SPECIAL CARE FACTORS FREQUENCY  PT (By licensed PT), OT (By licensed OT)     PT Frequency: 5x week OT Frequency: 5x week            Contractures Contractures Info: Not present    Additional  Factors Info  Code Status, Allergies Code Status Info: FULL Allergies Info: Soma Carisoprodol, Doxazosin           Current Medications (07/12/2020):  This is the current hospital active medication list Current Facility-Administered Medications  Medication Dose Route Frequency Provider Last Rate Last Admin  . acetaminophen  (TYLENOL) tablet 650 mg  650 mg Oral Q6H PRN Rise Patience, MD   650 mg at 07/10/20 1704   Or  . acetaminophen (TYLENOL) suppository 650 mg  650 mg Rectal Q6H PRN Rise Patience, MD      . amiodarone (PACERONE) tablet 200 mg  200 mg Oral BID Elodia Florence., MD   200 mg at 07/12/20 1508  . atorvastatin (LIPITOR) tablet 20 mg  20 mg Oral Daily Elodia Florence., MD   20 mg at 07/12/20 0953  . benzonatate (TESSALON) capsule 100 mg  100 mg Oral TID PRN Elodia Florence., MD   100 mg at 07/11/20 0411  . Chlorhexidine Gluconate Cloth 2 % PADS 6 each  6 each Topical Daily Rise Patience, MD   6 each at 07/12/20 1028  . diclofenac Sodium (VOLTAREN) 1 % topical gel 2 g  2 g Topical QID Elodia Florence., MD   2 g at 07/12/20 1254  . doxycycline (VIBRA-TABS) tablet 100 mg  100 mg Oral Q12H Elodia Florence., MD   100 mg at 07/12/20 7673  . feeding supplement (BOOST / RESOURCE BREEZE) liquid 1 Container  1 Container Oral Q24H Elodia Florence., MD      . feeding supplement (ENSURE ENLIVE / ENSURE PLUS) liquid 237 mL  237 mL Oral BID BM Allie Bossier, MD   237 mL at 07/12/20 0953  . hydrALAZINE (APRESOLINE) injection 5 mg  5 mg Intravenous Q6H PRN Elodia Florence., MD      . insulin aspart (novoLOG) injection 0-9 Units  0-9 Units Subcutaneous Q4H Rise Patience, MD   2 Units at 07/12/20 1257  . lidocaine (LIDODERM) 5 % 1 patch  1 patch Transdermal Q24H Elodia Florence., MD   1 patch at 07/11/20 1600  . metoprolol tartrate (LOPRESSOR) injection 5 mg  5 mg Intravenous Q6H PRN Elodia Florence., MD      . metoprolol tartrate (LOPRESSOR) tablet 50 mg  50 mg Oral Q8H Elodia Florence., MD   50 mg at 07/11/20 1500  . morphine 2 MG/ML injection 2 mg  2 mg Intravenous Q4H PRN Elodia Florence., MD   2 mg at 07/07/20 1834  . nitroGLYCERIN (NITROSTAT) SL tablet 0.4 mg  0.4 mg Sublingual Q5 min PRN Elodia Florence., MD      .  ondansetron [facility]) injection 4 mg  4 mg Intravenous Q8H PRN Lovey Newcomer T, NP   4 mg at 07/10/20 2016  . pantoprazole (PROTONIX) injection 40 mg  40 mg Intravenous Q24H Carl Best M, NP   40 mg at 07/12/20 1203  . phenol (CHLORASEPTIC) mouth spray 1 spray  1 spray Mouth/Throat PRN Elodia Florence., MD   1 spray at 07/10/20 1445  . traMADol (ULTRAM) tablet 50 mg  50 mg Oral Q6H Elodia Florence., MD   50 mg at 07/12/20 1203     Discharge Medications: Please see discharge summary for a list of discharge medications.  Relevant Imaging Results:  Relevant Lab Results:   Additional Information [id number]  Purcell Mouton, RN

## 2020-07-12 NOTE — Progress Notes (Addendum)
PROGRESS NOTE    Adrian Singh  GYB:[id number] DOB: [birth year] DOA: 07/04/2020 PCP: Adrian Carbon, MD  Chief Complaint  Patient presents with  . Abdominal Pain   Brief Narrative:  Adrian Singh Adrian Singh 86 y.o.WM PMHx CAD status post stenting, Adrian Singh. 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 Denies any vomiting fever or chills. Abdominal discomfort is generalized. Was able to take his medications.  He was admitted with an ileus which has now resolved.  Hospitalization has been complicated by aspiration pneumonia and atrial fibrillation with RVR.  Cardiology has been reconsulted due to his persistent RVR.  Hospitalization also c/b imaging concerning for neoplasm (see below).  Palliative care is following, plan for d/c to SNF for rehab and outpatient palliative (10/25 note).    Assessment & Plan:   Principal Problem:   Ileus ([place]) Active Problems:   Essential hypertension   Uncontrolled type 2 diabetes mellitus with diabetic polyneuropathy, without long-term current use of insulin (HCC)   CKD stage 3 due to type 2 diabetes mellitus ([place])   History of CVA (cerebrovascular accident)   CAD S/P percutaneous coronary angioplasty   Prostate cancer ([place])   Weakness generalized   Elevated troponin   Pressure injury of skin   Severe protein-calorie malnutrition ([place])   Adult failure to thrive   Hypokalemia   Atrial fibrillation with RVR (HCC)  Adrian Singh Fib with  RVR - increase metop as tolerated - BP limiting use of beta blocker.  No dilt with decreased ef. - digoxin x1 today.  Will start amidarone 200 mg BID after discussion with Adrian Singh from cards, they'll see Adrian Singh tomorrow. -Heparin drip per pharmacy some hematuria noted 10/21, was on hold - per cards, plan to d/c outpatient xarelto   Concern for intracranial neoplasm Dr. Mickeal Singh has reviewed imaging and does not currently recommend any intervention or workup Plan to discuss in brain/spine tumor board on 11/1  Ileus - CT scan consistent with ileus -- KUB 10/21 with stable diffuse gaseous distension -- continued abdominal distension today, though improved -- will consider repeat CT scan if patient with worsening pain/symptoms -- replace mag/K as needed to keep >2/4 respectively -- seems to be improving, continue soft diet -- will discuss with GI, appreciate recommendations (see 10/24 GI note)  Elevated troponin  Chest Pain  Coronary Artery Disease s/p stenting -- c/o intermittent chest discomfort 10/20, unclear etiology, he c/o diffuse pain at times.  Troponin was elevated, but relatively flat, not c/w ACS and EKG was without changes concerning for ACS. -- echo with EF to 40-45%, diffuse hypokinesis and akinesis in basal and mid inferior and inferolateral walls -- will consult cardiology given decreased EF -> cards recommending atoravastatin, metop has been uptitrated -- will continue to monitor, asymptomatic today  HCAP Start abx with ceftriaxone/doxy for now (10/22 - 10/26) SLP eval for possible aspiration -> recommending dysphagia 3, thin liquid Follow MRSA and sputum cx if able Repeat CXR 10/23 am with decreased lung volumes with increased atelectasis  Hypokalemia  Hypophosphatemia - replace and follow  Elevated LFT's  Elevated Alk Phos:  - normal bilirubin, GGT wnl -> suspect alk phos 2/2 bone mets - Appreciate Gi assistance, will continue to follow, may need  additional imaging  Chronic pain syndrome -Morphine PRN --at home is on tramadol per med rec  Chronic systolic CHF -Strict in and out -Daily weight  --caution with IVF, takes 40 mg lasix daily as need at home --echo with decreased EF 40-45% with diffuse hypokinesis, appreciate cardiology recs  Chronic anemia  Thrombocytopenia -labs suggestive of AOCD -- s/p 1 unit pRBC -- stable today - hypoprolieferative retics - continue to trend  Hematuria - while on heparin gtt, follow UA - hold heparin for now - UA with >50 RBC's, urine appears clear this AM - follow outpatient   NAGMA - improved, continue to monitor  DM type II controlled with complication; DM gastroparesis  Hypoglycemia -10/6 hemoglobin A1c= 6.5 -Sensitive SSI -- follow with D5 containing fluids  Metastatic prostate cancer  Hx CVA with residual left sided weakness and dysarthria Has abnormal MRI brain from 04/2020 and short term follow up was recommended Will order repeat MRI with contrast -> notable for 1.9x1.7x2.5 cm enhancing lesion within posterior R frontal lobe white matter with extension into the adjacent callosal body --- concerning for intracranial neoplasm (as above).  Also with punctate focus of restricted diffusion posterior to this lesion within the posterior R frontal lobe (acute infarct vs satellite tumor nodule).  Redemonstrated chronic cortically based infarcts in R frontal, parietal, and temporal occiptial lobes.  Extesive T1 hypointense marrow signal concerning for red marrow reconversion, osseous metastatic disease, or marrow infiltrative process (see report). Continue lipitor Consider discussing extensive T1 hypointense marrow signal concerning for possible osseous metastatic disease (vs red marrow reconversion or marrow infiltrative process) with pt oncologist  Thrombocytopenia Continue to monitor, downtrending  Goals of Care: Will c/s palliative care to assist with Adrian Singh conversations.   Discussed this with pt and daughter at bedside 10/23. Appreciate palliative care c/s -planning for limited code status, plan for SNF with rehab, outpatient pallaitive care  DVT prophylaxis: heparin  Code Status: full  Family Communication: none at bedside - daughter 10/26 Disposition:   Status is: Inpatient  Remains inpatient appropriate because:Inpatient level of care appropriate due to severity of illness   Dispo: The patient is from: Home              Anticipated d/c is to: pending              Anticipated d/c date is: > 3 days              Patient currently is not medically stable to d/c.    Consultants:   GI  Procedures:  none  Antimicrobials:  Anti-infectives (From admission, onward)   Start     Dose/Rate Route Frequency Ordered Stop   07/08/20 1230  cefTRIAXone (ROCEPHIN) 2 g in sodium chloride 0.9 % 100 mL IVPB        2 g 200 mL/hr over 30 Minutes Intravenous Every 24 hours 07/08/20 1141 07/12/20 1325   07/08/20 1230  doxycycline (VIBRA-TABS) tablet 100 mg        100 mg Oral Every 12 hours 07/08/20 1141       Subjective: No new complaints today  Objective: Vitals:   07/12/20 0453 07/12/20 0630 07/12/20 1312 07/12/20 1405  BP:  (!) 101/58 130/77 98/69  Pulse:  (!) 139 86 84  Resp:  16 16   Temp:  97.7  F (36.5 C) 97.9 F (36.6 C)   TempSrc:  Axillary Oral   SpO2:  99% 99%   Weight: 68.9 kg     Height:        Intake/Output Summary (Last 24 hours) at 07/12/2020 1420 Last data filed at 07/12/2020 0700 Gross per 24 hour  Intake 120 ml  Output 325 ml  Net -205 ml   Filed Weights   07/10/20 0500 07/11/20 0413 07/12/20 0453  Weight: 67.9 kg 68.8 kg 68.9 kg    Examination:  General: No acute distress. Cardiovascular: tachy, irregularly irregular Lungs: Clear to auscultation bilaterally. Abdomen: Soft, nontender, nondistended Neurological: Alert and oriented 3. Moves all extremities 4 . Cranial nerves II through XII grossly intact. Skin: Warm  and dry. No rashes or lesions. Extremities: No clubbing or cyanosis. No edema.    Data Reviewed: I have personally reviewed following labs and imaging studies  CBC: Recent Labs  Lab 07/08/20 0606 07/09/20 0508 07/10/20 0527 07/11/20 0442 07/12/20 0429  WBC 6.7 5.9 5.4 7.3 7.5  NEUTROABS 5.1 4.1 3.4 5.3 5.1  HGB 8.5* 8.5* 8.5* 9.2* 8.8*  HCT 28.0* 27.2* 28.1* 31.5* 30.3*  MCV 80.7 80.0 82.4 84.5 83.7  PLT 134* 131* 123* 104* 110*    Basic Metabolic Panel: Recent Labs  Lab 07/08/20 0606 07/09/20 0508 07/10/20 0527 07/11/20 0442 07/12/20 0429  NA 143 144 143 140 142  K 3.4* 3.0* 3.4* 4.5 4.1  CL 110 111 108 109 110  CO2 _0 21*  GLUCOSE 121* 85 148* 166* 111*  BUN _1 CREATININE 0.73 0.63 0.68 0.70 0.72  CALCIUM 7.1* 7.5* 7.6* 7.8* 7.9*  MG 2.1 1.9 2.0 1.9 1.9  PHOS 2.6 2.0* 2.3* 2.5 3.0    GFR: Estimated Creatinine Clearance: 68.2 mL/min (by C-G formula based on SCr of 0.72 mg/dL).  Liver Function Tests: Recent Labs  Lab 07/08/20 0606 07/09/20 0508 07/10/20 0527 07/11/20 0442 07/12/20 0429  AST 87* 58* 37 34 36  ALT _2 ALKPHOS 863* 745* 559* 494* 457*  BILITOT 1.6* 0.6 0.9 0.7 0.5  PROT 6.3* 6.4* 6.0* 6.1* 6.0*  ALBUMIN 1.9* 1.9* 1.8* 1.8* 1.7*    CBG: Recent Labs  Lab 07/11/20 2000 07/12/20 0029 07/12/20 0416 07/12/20 0722 07/12/20 1135  GLUCAP 120* 105* 100* 101* 183*     Recent Results (from the past 240 hour(s))  Respiratory Panel by RT PCR (Flu Jeannifer Drakeford&B, Covid) - Nasopharyngeal Swab     Status: None   Collection Time: 07/04/20 10:56 PM   Specimen: Nasopharyngeal Swab  Result Value Ref Range Status   SARS Coronavirus 2 by RT PCR NEGATIVE NEGATIVE Final    Comment: (NOTE) SARS-CoV-2 target nucleic acids are NOT DETECTED.  The SARS-CoV-2 RNA is generally detectable in upper respiratoy specimens during the acute phase of infection. The lowest concentration of SARS-CoV-2 viral copies this assay can detect  is 131 copies/mL. Jantz Main negative result does not preclude SARS-Cov-2 infection and should not be used as the sole basis for treatment or other patient management decisions. Dottie Vaquerano negative result may occur with  improper specimen collection/handling, submission of specimen other than nasopharyngeal swab, presence of viral mutation(s) within the areas targeted by this assay, and inadequate number of viral copies (<131 copies/mL). Amsi Grimley negative result must be combined with clinical observations, patient history, and epidemiological information. The expected result is Negative.  Fact Sheet for Patients:  PinkCheek.be  Fact Sheet for Healthcare Providers:  GravelBags.it  This test is no t yet approved or cleared by the [place] and  has been authorized for detection and/or diagnosis of SARS-CoV-2 by FDA under an Emergency Use Authorization (EUA). This EUA will remain  in effect (meaning this test can be used) for the duration of the COVID-19 declaration under Section 564(b)(1) of the Act, 21 U.S.C. section 360bbb-3(b)(1), unless the authorization is terminated or revoked sooner.     Influenza Shresta Risden by PCR NEGATIVE NEGATIVE Final   Influenza B by PCR NEGATIVE NEGATIVE Final    Comment: (NOTE) The Xpert Xpress SARS-CoV-2/FLU/RSV assay is intended as an aid in  the diagnosis of influenza from Nasopharyngeal swab specimens and  should not be used as Avigayil Ton sole basis for treatment. Nasal washings and  aspirates are unacceptable for Xpert Xpress SARS-CoV-2/FLU/RSV  testing.  Fact Sheet for Patients: PinkCheek.be  Fact Sheet for Healthcare Providers: GravelBags.it  This test is not yet approved or cleared by the [place] FDA and  has been authorized for detection and/or diagnosis of SARS-CoV-2 by  FDA under an Emergency Use Authorization (EUA). This EUA will remain  in effect  (meaning this test can be used) for the duration of the  Covid-19 declaration under Section 564(b)(1) of the Act, 21  U.S.C. section 360bbb-3(b)(1), unless the authorization is  terminated or revoked. Performed at [facility], [place] [address], [place], [place] [address]   MRSA PCR Screening     Status: None   Collection Time: 07/05/20  6:08 AM   Specimen: Nasopharyngeal  Result Value Ref Range Status   MRSA by PCR NEGATIVE NEGATIVE Final    Comment:        The GeneXpert MRSA Assay (FDA approved for NASAL specimens only), is one component of Krisinda Giovanni comprehensive MRSA colonization surveillance program. It is not intended to diagnose MRSA infection nor to guide or monitor treatment for MRSA infections. Performed at [facility], [place] [address], [place], [place] [address]   MRSA PCR Screening     Status: None   Collection Time: 07/08/20  3:00 PM   Specimen: Nasal Mucosa; Nasopharyngeal  Result Value Ref Range Status   MRSA by PCR NEGATIVE NEGATIVE Final    Comment:        The GeneXpert MRSA Assay (FDA approved for NASAL specimens only), is one component of Dmiya Malphrus comprehensive MRSA colonization surveillance program. It is not intended to diagnose MRSA infection nor to guide or monitor treatment for MRSA infections. Performed at [facility], [place] [address], [place], [place] [address]          Radiology Studies: MR BRAIN W WO CONTRAST  Result Date: 07/12/2020 CLINICAL DATA:  Stroke, follow-up. EXAM: MRI HEAD WITHOUT AND WITH CONTRAST TECHNIQUE: Multiplanar, multiecho pulse sequences of the brain and surrounding structures were obtained without and with intravenous contrast. CONTRAST:  9m GADAVIST GADOBUTROL 1 MMOL/ML IV SOLN COMPARISON:  Head CT 06/18/2020, brain MRI with contrast 05/01/2020. Brain MRI without contrast 05/01/2020. Head CT 04/30/2020. FINDINGS: Brain: Moderate generalized cerebral atrophy. Again demonstrated  is Akisha Sturgill circumscribed focus of T2/FLAIR hyperintense signal abnormality within the posterior right frontal lobe white matter and extending to involve portions of the callosal body on the right. As before, there is associated mass effect with partial effacement of the right lateral ventricle. The lesion is similar to slightly increased in size as compared to the prior MRI of 814 2021, measuring 1.9 x 1.7 x 2.5 cm (AP x TV  x CC) on the current exam (previously 1.8 x 1.6 x 2.3) (remeasured on prior). Internal regions of restricted diffusion are more conspicuous as compared to the prior study (series 6, image 38). Additionally, the lesion demonstrates progression of patchy and irregular enhancement. There is Pinchos Topel punctate focus of restricted diffusion immediately posterior to the lesion, which may reflect an acute infarct or satellite focus of tumor (series 5, image 90). Redemonstrated small scattered chronic cortical infarcts within the right frontal and parietal lobes, as well as right temporal occipital lobe. Redemonstrated tiny chronic infarct within the left cerebellar hemisphere. No chronic intracranial blood products. No extra-axial fluid collection. No midline shift. Vascular: Expected proximal arterial flow voids. Skull and upper cervical spine: New from the prior MRI of 04/30/2020, there is extensive abnormal T1 hypointense marrow signal within the visualized upper cervical spine. Sinuses/Orbits: Visualized orbits show no acute finding. Mild scattered paranasal sinus mucosal thickening. Trace fluid within bilateral mastoid air cells. These results will be called to the ordering clinician or representative by the Radiologist Assistant, and communication documented in the PACS or [employer]. IMPRESSION: 1.9 x 1.7 x 2.5 cm enhancing lesion within the posterior right frontal lobe white matter with extension into the adjacent callosal body. This lesion is similar to slightly increased in size as compared to the  prior brain MRI of 04/30/2020 and demonstrates persistent mass effect with partial effacement of the right lateral ventricle. Additionally, there has been an interval progression of associated irregular enhancement and restricted diffusion. These findings raise strong suspicion for an intracranial neoplasm (i.e. glioma). Neuro-Oncology consultation is recommended. There is Jeremy Ditullio punctate focus of restricted diffusion immediately posterior to this lesion within the posterior right frontal lobe, which may reflect an acute infarct or possibly Surya Schroeter small satellite tumor nodule. Moderate generalized cerebral atrophy. Redemonstrated small chronic cortically based infarcts within the right frontal, parietal and temporal occipital lobes. New from the prior examination, there is extensive abnormal T1 hypointense marrow signal throughout the visualized upper cervical spine. Primary differential considerations include red marrow reconversion, osseous metastatic disease or Adira Limburg marrow infiltrative process. Electronically Signed   By: Kellie Simmering DO   On: 07/12/2020 12:40        Scheduled Meds: . atorvastatin  20 mg Oral Daily  . Chlorhexidine Gluconate Cloth  6 each Topical Daily  . diclofenac Sodium  2 g Topical QID  . doxycycline  100 mg Oral Q12H  . feeding supplement  1 Container Oral Q24H  . feeding supplement  237 mL Oral BID BM  . insulin aspart  0-9 Units Subcutaneous Q4H  . lidocaine  1 patch Transdermal Q24H  . metoprolol tartrate  50 mg Oral Q8H  . pantoprazole (PROTONIX) IV  40 mg Intravenous Q24H  . traMADol  50 mg Oral Q6H   Continuous Infusions:    LOS: 7 days    Time spent: over 30 min    Fayrene Helper, MD [facility]   To contact the attending provider between 7A-7P or the covering provider during after hours 7P-7A, please log into the web site [email or web address] and access using universal [place] password for that web site. If you do not have the password, please call the  hospital operator.  07/12/2020, 2:20 PM

## 2020-07-12 NOTE — Progress Notes (Signed)
Inpatient Rehab Admissions Coordinator Note:   Per case manager request, pt was screened for CIR candidacy by Shann Medal, PT, DPT.  At this time note pt with diagnosis of ileus, which is not a qualifying diagnosis for CIR.  Also note pt with limited tolerance with therapy (only able to tolerate 30 minutes PT/OT cotreat).  Agree with therapy recommendations for SNF.  Please contact me with questions.   Shann Medal, PT, DPT [phone] 07/12/20 11:12 AM

## 2020-07-12 NOTE — Progress Notes (Signed)
Physical Therapy Treatment Patient Details Name: Adrian Singh MRN: [id number] DOB: 23-Jul-1934 Today's Date: 07/12/2020    History of Present Illness 86 y.o. WM PMHx CAD status post stenting, A. 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 Pt now admitted with Ileus.    PT Comments    Pt AxO x 3 and pleasant but slow and sluggish "feels poorly".  Only assisted to EOB approx 4 min tolerance due to drop in BP and elevated HR.  HR range 126 - 152 supine then increased to 134-164.  General bed mobility comments: assisted to EOB and back to bed only due to positive dizziness and low BP's   Pt not able to support self sitting EOB > 30 seconds.  Very weak.   Follow Up Recommendations  SNF     Equipment Recommendations  None recommended by PT    Recommendations for Other Services       Precautions / Restrictions Precautions Precaution Comments: Hx of orthostatic hypotension, Loose BMs    Mobility  Bed Mobility Overal bed mobility: Needs Assistance Bed Mobility: Sidelying to Sit;Supine to Sit     Supine to sit: Max assist Sit to supine: Total assist   General bed mobility comments: assisted to EOB and back to bed only due to positive dizziness and low BP's   Pt not able to support self sitting EOB > 30 seconds.  Very weak.  Transfers                 General transfer comment: Unable to tolerate any OOB activity due to low BP and A Fib > 150  Ambulation/Gait                 Stairs             Wheelchair Mobility    Modified Rankin (Stroke Patients Only)       Balance                                             Cognition Arousal/Alertness: Awake/alert Behavior During Therapy: WFL for tasks assessed/performed Overall Cognitive Status: Within Functional Limits for tasks assessed                                 General Comments: AxO x 3 very pleasant but slow, sluggish      Exercises      General Comments        Pertinent Vitals/Pain Pain Assessment: No/denies pain    Home Living                      Prior Function            PT Goals (current goals can now be found in the care plan section) Progress towards PT goals: Progressing toward goals    Frequency    Min 2X/week      PT Plan Current plan remains appropriate    Co-evaluation  AM-PAC PT "6 Clicks" Mobility   Outcome Measure  Help needed turning from your back to your side while in a flat bed without using bedrails?: Total Help needed moving from lying on your back to sitting on the side of a flat bed without using bedrails?: Total Help needed moving to and from a bed to a chair (including a wheelchair)?: Total Help needed standing up from a chair using your arms (e.g., wheelchair or bedside chair)?: Total Help needed to walk in hospital room?: Total Help needed climbing 3-5 steps with a railing? : Total 6 Click Score: 6    End of Session Equipment Utilized During Treatment: Gait belt Activity Tolerance: Treatment limited secondary to medical complications (Comment) Patient left: in bed;with call bell/phone within reach;with nursing/sitter in room;with bed alarm set Nurse Communication: Mobility status PT Visit Diagnosis: History of falling (Z91.81);Muscle weakness (generalized) (M62.81);Unsteadiness on feet (R26.81);Difficulty in walking, not elsewhere classified (R26.2)     Time: 9798-9211 PT Time Calculation (min) (ACUTE ONLY): 25 min  Charges:  $Therapeutic Activity: 23-37 mins                      Rica Koyanagi  PTA Acute  Rehabilitation Services Pager      [phone] Office      [phone]

## 2020-07-12 NOTE — Progress Notes (Signed)
   07/12/20 0630  Assess: MEWS Score  Temp 97.7 F (36.5 C)  BP (!) 101/58  Pulse Rate (!) 139  ECG Heart Rate (!) 146  Resp 16  Level of Consciousness Alert  SpO2 99 %  O2 Device Room Air  Patient Activity (if Appropriate) In bed  Assess: MEWS Score  MEWS Temp 0  MEWS Systolic 0  MEWS Pulse 3  MEWS RR 0  MEWS LOC 0  MEWS Score 3  MEWS Score Color Yellow  Assess: if the MEWS score is Yellow or Red  Were vital signs taken at a resting state? Yes  Focused Assessment No change from prior assessment  Early Detection of Sepsis Score *See Row Information* Low  MEWS guidelines implemented *See Row Information* Yes  Treat  MEWS Interventions Administered prn meds/treatments  Pain Score 0  Take Vital Signs  Increase Vital Sign Frequency  Yellow: Q 2hr X 2 then Q 4hr X 2, if remains yellow, continue Q 4hrs  Escalate  MEWS: Escalate Yellow: discuss with charge nurse/RN and consider discussing with provider and RRT  Notify: Charge Nurse/RN  Name of Charge Nurse/RN Notified [place]  Date Charge Nurse/RN Notified 07/12/20  Time Charge Nurse/RN Notified 0700  Notify: Provider  Provider Name/Title Sharlet Salina  Date Provider Notified 07/12/20  Time Provider Notified 0530  Notification Type Page  Notification Reason Change in status  Response See new orders  Date of Provider Response 07/12/20  Time of Provider Response [phone]

## 2020-07-12 NOTE — Progress Notes (Signed)
Nutrition Follow-up  DOCUMENTATION CODES:   Severe malnutrition in context of chronic illness  INTERVENTION:  - continue Ensure Enlive BID, each supplement provides 350 kcal and 20 grams of protein. - will order Boost Breeze once/day, each supplement provides 250 kcal and 9 grams of protein. - will order Magic Cup BID with meals, each supplement provides 290 kcal and 9 grams of protein.   NUTRITION DIAGNOSIS:   Severe Malnutrition related to chronic illness (stroke) as evidenced by severe fat depletion, severe muscle depletion. -ongoing  GOAL:   Patient will meet greater than or equal to 90% of their needs -unmet  MONITOR:   PO intake, Supplement acceptance, Labs, Weight trends  ASSESSMENT:   87 y.o. male with history of CAD s/p stenting, A. fib, type 2 DM, chronic anemia, stroke with dysarthria, and R-sided weakness. He was recently admitted 10 days ago for orthostatic hypotension at which time he had some medication changes. He presented to the ED from [facility] with 2 day history of abdominal pain and distention with last BM >24 hours prior.  Diet advanced from NPO to [place] on 10/22 at 1010 and to Soft on 10/23 at [place]. He ate 5% of breakfast and 70% of lunch on 10/23 (total of 126 kcal, 3 grams protein); 100% of breakfast and lunch and 50% of dinner on 10/24 (total of 861 kcal, 12.5 grams protein); 50% of breakfast this AM (159 kcal, 3 grams protein).   Meals have been small and mainly FLD-type items. There have been many meals where only 1-2 items are on the tray.   Weight has been stable since 10/19. Non-pitting edema to BUE documented in the edema section of the flow sheet.   Palliative Care following and last met with patient and family yesterday afternoon. He is now a limited code status/DNI.    Per notes: - likely plan for SNF at the time of d/c as patient is not a candidate for CIR - ileus--improving - HCAP    Labs reviewed; CBGs: 105, 100, 101, 183 mg/dl,  Ca: 7.9 mg/dl, alk phos elevated.  Medications reviewed; sliding scale novolog.   Diet Order:   Diet Order            DIET SOFT Room service appropriate? Yes; Fluid consistency: Thin  Diet effective now                 EDUCATION NEEDS:   No education needs have been identified at this time  Skin:  Skin Assessment: Skin Integrity Issues: Skin Integrity Issues:: Stage I Stage I: sacrum and coccyx  Last BM:  10/26 (type 7 x2; type 6 x1)  Height:   Ht Readings from Last 1 Encounters:  07/05/20 '5\' 11"'  (1.803 m)    Weight:   Wt Readings from Last 1 Encounters:  07/12/20 68.9 kg     Estimated Nutritional Needs:  Kcal:  2015-2235 kcal Protein:  100-115 grams Fluid:  >/= 2.2 L/day       [name], MS, RD, LDN, CNSC Inpatient Clinical Dietitian RD pager # available in AMION  After hours/weekend pager # available in [facility]

## 2020-07-12 NOTE — Progress Notes (Signed)
Neuro-Oncology Note  Reviewed MRI brain images and report from 07/12/20.  This process could represent neoplasm, but imaging features and lack of meaningful change over 2 months mostly rule out aggressive/malignant processes.   Given patient's clinical status, would not recommend any intervention or workup aside from possibly repeating an MRI in 2-3 months.   We will discuss case in brain/spine tumor board on 07/18/20, and recommend he follow up in the neuro-oncology clinic.  Please call/text/page with further questions, concerns.  Adrian Sellers, MD

## 2020-07-12 NOTE — TOC Progression Note (Addendum)
Transition of Care [facility]) - Progression Note    Patient Details  Name: Adrian Singh MRN: [id number] Date of Birth: 1934-06-24  Transition of Care [facility]) CM/SW Contact  Purcell Mouton, RN Phone Number: 07/12/2020, 11:34 AM  Clinical Narrative:     CIR reviewed pt's chart and pt will not qualify for 83mins-3hrs of rehab, pt's insurance [employer]  will not approve admission to in pt rehab. This information was shared with pt's daughter [name] at bedside.   Expected Discharge Plan: [place] Barriers to Discharge: Barriers Unresolved (comment) (a.fib and ielus)  Expected Discharge Plan and Services Expected Discharge Plan: [place]   Discharge Planning Services: CM Consult Post Acute Care Choice: [place] Living arrangements for the past 2 months: [place] Expected Discharge Date:  (unknown)                                     Social Determinants of Health (SDOH) Interventions    Readmission Risk Interventions Readmission Risk Prevention Plan 06/20/2020 06/11/2020  Transportation Screening Complete Complete  Medication Review Press photographer) Complete Complete  PCP or Specialist appointment within 3-5 days of discharge Complete -  [place] or Home Care Consult Complete -  SW Recovery Care/Counseling Consult Complete Complete  Palliative Care Screening Not Applicable Not Applicable  Skilled Nursing Facility Complete Complete  Some recent data might be hidden

## 2020-07-12 NOTE — Progress Notes (Signed)
Pt wedding band giving to daughter, [name]. Witness by Probation officer and Nita Sells.

## 2020-07-12 NOTE — TOC Progression Note (Addendum)
Transition of Care [facility]) - Progression Note    Patient Details  Name: ELDAR ROBITAILLE MRN: [id number] Date of Birth: 08/27/1934  Transition of Care [facility]) CM/SW Contact  Purcell Mouton, RN Phone Number: 07/12/2020, 12:08 PM  Clinical Narrative:     A call from [facility] revealed that [facility] will not approve LTAC.   Expected Discharge Plan: [place] Barriers to Discharge: Barriers Unresolved (comment) (a.fib and ielus)  Expected Discharge Plan and Services Expected Discharge Plan: [place]   Discharge Planning Services: CM Consult Post Acute Care Choice: [place] Living arrangements for the past 2 months: [place] Expected Discharge Date:  (unknown)                                     Social Determinants of Health (SDOH) Interventions    Readmission Risk Interventions Readmission Risk Prevention Plan 06/20/2020 06/11/2020  Transportation Screening Complete Complete  Medication Review Press photographer) Complete Complete  PCP or Specialist appointment within 3-5 days of discharge Complete -  [place] or Home Care Consult Complete -  SW Recovery Care/Counseling Consult Complete Complete  Palliative Care Screening Not Applicable Not Applicable  Skilled Nursing Facility Complete Complete  Some recent data might be hidden

## 2020-07-13 DIAGNOSIS — I2510 Atherosclerotic heart disease of native coronary artery without angina pectoris: Secondary | ICD-10-CM | POA: Diagnosis not present

## 2020-07-13 DIAGNOSIS — I10 Essential (primary) hypertension: Secondary | ICD-10-CM | POA: Diagnosis not present

## 2020-07-13 DIAGNOSIS — K567 Ileus, unspecified: Secondary | ICD-10-CM | POA: Diagnosis not present

## 2020-07-13 DIAGNOSIS — I951 Orthostatic hypotension: Secondary | ICD-10-CM

## 2020-07-13 DIAGNOSIS — I4891 Unspecified atrial fibrillation: Secondary | ICD-10-CM | POA: Diagnosis not present

## 2020-07-13 LAB — COMPREHENSIVE METABOLIC PANEL
ALT: 21 U/L (ref 0–44)
AST: 43 U/L — ABNORMAL HIGH (ref 15–41)
Albumin: 1.8 g/dL — ABNORMAL LOW (ref 3.5–5.0)
Alkaline Phosphatase: 492 U/L — ABNORMAL HIGH (ref 38–126)
Anion gap: 8 (ref 5–15)
BUN: 26 mg/dL — ABNORMAL HIGH (ref 8–23)
CO2: 24 mmol/L (ref 22–32)
Calcium: 7.7 mg/dL — ABNORMAL LOW (ref 8.9–10.3)
Chloride: 107 mmol/L (ref 98–111)
Creatinine, Ser: 0.81 mg/dL (ref 0.61–1.24)
GFR, Estimated: >60 mL/min (ref >60)
Glucose, Bld: 140 mg/dL — ABNORMAL HIGH (ref 70–99)
Potassium: 4.1 mmol/L (ref 3.5–5.1)
Sodium: 139 mmol/L (ref 135–145)
Total Bilirubin: 0.5 mg/dL (ref 0.3–1.2)
Total Protein: 6.2 g/dL — ABNORMAL LOW (ref 6.5–8.1)

## 2020-07-13 LAB — CBC WITH DIFFERENTIAL/PLATELET
Abs Immature Granulocytes: 1.34 10*3/uL — ABNORMAL HIGH (ref 0.00–0.07)
Basophils Absolute: 0.1 10*3/uL (ref 0.0–0.1)
Basophils Relative: 1 %
Eosinophils Absolute: 0.1 10*3/uL (ref 0.0–0.5)
Eosinophils Relative: 1 %
HCT: 31.7 % — ABNORMAL LOW (ref 39.0–52.0)
Hemoglobin: 9.4 g/dL — ABNORMAL LOW (ref 13.0–17.0)
Immature Granulocytes: 13 %
Lymphocytes Relative: 12 %
Lymphs Abs: 1.3 10*3/uL (ref 0.7–4.0)
MCH: 25.1 pg — ABNORMAL LOW (ref 26.0–34.0)
MCHC: 29.7 g/dL — ABNORMAL LOW (ref 30.0–36.0)
MCV: 84.5 fL (ref 80.0–100.0)
Monocytes Absolute: 0.8 10*3/uL (ref 0.1–1.0)
Monocytes Relative: 7 %
Neutro Abs: 7.1 10*3/uL (ref 1.7–7.7)
Neutrophils Relative %: 66 %
Platelets: 97 10*3/uL — ABNORMAL LOW (ref 150–400)
RBC: 3.75 MIL/uL — ABNORMAL LOW (ref 4.22–5.81)
RDW: 21.8 % — ABNORMAL HIGH (ref 11.5–15.5)
WBC: 10.6 10*3/uL — ABNORMAL HIGH (ref 4.0–10.5)
nRBC: 0.8 % — ABNORMAL HIGH (ref 0.0–0.2)

## 2020-07-13 LAB — GLUCOSE, CAPILLARY
Glucose-Capillary: 113 mg/dL — ABNORMAL HIGH (ref 70–99)
Glucose-Capillary: 119 mg/dL — ABNORMAL HIGH (ref 70–99)
Glucose-Capillary: 125 mg/dL — ABNORMAL HIGH (ref 70–99)
Glucose-Capillary: 145 mg/dL — ABNORMAL HIGH (ref 70–99)
Glucose-Capillary: 150 mg/dL — ABNORMAL HIGH (ref 70–99)
Glucose-Capillary: 170 mg/dL — ABNORMAL HIGH (ref 70–99)

## 2020-07-13 LAB — MAGNESIUM: Magnesium: 2 mg/dL (ref 1.7–2.4)

## 2020-07-13 LAB — PHOSPHORUS: Phosphorus: 3.3 mg/dL (ref 2.5–4.6)

## 2020-07-13 MED ORDER — NYSTATIN 100000 UNIT/ML MT SUSP
5.0000 mL | Freq: Four times a day (QID) | OROMUCOSAL | Status: DC
  Administered 2020-07-13 – 2020-07-15 (×6): 500000 [IU] via OROMUCOSAL
  Filled 2020-07-13 (×6): qty 5

## 2020-07-13 MED ORDER — PANTOPRAZOLE SODIUM 40 MG PO TBEC
40.0000 mg | DELAYED_RELEASE_TABLET | Freq: Every day | ORAL | Status: DC
  Administered 2020-07-14 – 2020-07-15 (×2): 40 mg via ORAL
  Filled 2020-07-13 (×2): qty 1

## 2020-07-13 MED ORDER — PANTOPRAZOLE SODIUM 40 MG PO TBEC: 40.0000 mg | DELAYED_RELEASE_TABLET | Freq: Every day | ORAL | Status: DC

## 2020-07-13 NOTE — Progress Notes (Signed)
Occupational Therapy Treatment Patient Details Name: Adrian Singh MRN: [id number] DOB: 1934-01-18 Today's Date: 07/13/2020    History of present illness 86 y.o. WM PMHx CAD status post stenting, A. 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 Pt now admitted with Ileus.   OT comments  Patient's daughter in patient's room this afternoon and reports she wants to try to take her father home at discharge. Reports having a hospital bed and BSC and having several services lined up. During treatment patient's daughter educated on transfer techniques and exercises to assist with strengthening. Patient's BP 127/60 with HR 66 and dropped to 114/58 at edge of bed. Attempted to stand pivot with max assist but patient unable to extend legs fully. Attempted stand with elevated surface and walker and patient unable to power up. Patient returned to supine as he began to fatigue and leaning backwards. Patient performed UB and LB exercises in bed. If goes home, needs to be able to transfer with family and have 24/7 assistance. Otherwise, would continue to recommend SNF.   Follow Up Recommendations  SNF;Home health OT;Supervision/Assistance - 24 hour    Equipment Recommendations       Recommendations for Other Services      Precautions / Restrictions Precautions Precautions: Fall Precaution Comments: Hx of orthostatic hypotension, Loose BMs Restrictions Weight Bearing Restrictions: No       Mobility Bed Mobility Overal bed mobility: Needs Assistance       Supine to sit: Max assist;HOB elevated Sit to supine: Total  assist   General bed mobility comments: Max assist to transfer to side of bed with assitance for LEs and trunk lift off. Use of pad to pivot. Daughter instructed during transfers on technique. Total assist for return to bed due to fatigue. Daughter instructed on use of bed pads to assist with bed mobility and back safety.  Transfers Overall transfer level: Needs assistance Equipment used: Rolling walker (2 wheeled) Transfers: Sit to/from Stand           General transfer comment: Attempted to stand for stand pivot to recliner - patient max assist for power up but unable to fully extend knees. Returned to seated position. Therapist inreased height of bed and attempted to only stand with walker. Patient unable to power up.    Balance Overall balance assessment: Needs assistance Sitting-balance support: No upper extremity supported;Feet supported Sitting balance-Leahy Scale: Poor Sitting balance - Comments: min assist to reduce posterior lean.   Standing balance support: During functional activity Standing balance-Leahy Scale: Zero                             ADL either performed or assessed with clinical judgement   ADL                                               Vision Patient Visual Report: No change from baseline     Perception     Praxis      Cognition Arousal/Alertness: Awake/alert Behavior During Therapy: WFL for tasks assessed/performed Overall  Cognitive Status: Within Functional Limits for tasks assessed                                          Exercises Other Exercises Other Exercises: In supine, each arm: shoulder flexion x 10, elbow flexion x 10, tricep resistance x 3 Other Exercises: In supine: Heel slides x 5 each leg   Shoulder Instructions       General Comments      Pertinent Vitals/ Pain       Pain Assessment: No/denies pain  Home Living                                           Prior Functioning/Environment              Frequency  Min 2X/week        Progress Toward Goals  OT Goals(current goals can now be found in the care plan section)  Progress towards OT goals: Progressing toward goals  Acute Rehab OT Goals Patient Stated Goal: "whatever you want to work on" OT Goal Formulation: With patient Time For Goal Achievement: 07/23/20 Potential to Achieve Goals: [place] Discharge plan needs to be updated    Co-evaluation          OT goals addressed during session: Strengthening/ROM (functional mobility.)      AM-PAC OT "6 Clicks" Daily Activity     Outcome Measure   Help from another person eating meals?: A Little Help from another person taking care of personal grooming?: A Little Help from another person toileting, which includes using toliet, bedpan, or urinal?: Total Help from another person bathing (including washing, rinsing, drying)?: A Lot Help from another person to put on and taking off regular upper body clothing?: A Lot Help from another person to put on and taking off regular lower body clothing?: Total 6 Click Score: 12    End of Session Equipment Utilized During Treatment: Gait belt;Rolling walker  OT Visit Diagnosis: Muscle weakness (generalized) (M62.81)   Activity Tolerance Patient limited by fatigue   Patient Left in bed;with call bell/phone within reach;with family/visitor present;with bed alarm set   Nurse Communication  (okay to see per rn)        Time: 2706-2376 OT Time Calculation (min): 30 min  Charges: OT General Charges $OT Visit: 1 Visit OT Treatments $Therapeutic Activity: 8-22 mins $Therapeutic Exercise: 8-22 mins  Derl Barrow, OTR/L [place]  Office [phone] Pager: [place] 07/13/2020, 4:59 PM

## 2020-07-13 NOTE — Progress Notes (Signed)
Unable to complete orthostatic VS on patient.  Patient unable to stand and required mod assist just to sit at side of bed.

## 2020-07-13 NOTE — Progress Notes (Signed)
Progress Note  Patient Name: Adrian Singh Date of Encounter: 07/13/2020  [facility] Cardiologist: Peter Martinique, MD   Subjective   No complains. No chest pain or dyspnea.   Inpatient Medications    Scheduled Meds: . amiodarone  200 mg Oral BID  . atorvastatin  20 mg Oral Daily  . Chlorhexidine Gluconate Cloth  6 each Topical Daily  . diclofenac Sodium  2 g Topical QID  . doxycycline  100 mg Oral Q12H  . feeding supplement  1 Container Oral Q24H  . feeding supplement  237 mL Oral BID BM  . insulin aspart  0-9 Units Subcutaneous Q4H  . lidocaine  1 patch Transdermal Q24H  . metoprolol tartrate  50 mg Oral Q8H  . pantoprazole (PROTONIX) IV  40 mg Intravenous Q24H  . traMADol  50 mg Oral Q6H   Continuous Infusions:  PRN Meds: acetaminophen **OR** acetaminophen, benzonatate, hydrALAZINE, metoprolol tartrate, morphine injection, nitroGLYCERIN, ondansetron (ZOFRAN) IV, phenol   Vital Signs    Vitals:   07/12/20 1607 07/12/20 2005 07/13/20 0500 07/13/20 0542  BP: 136/86 (!) 146/72  (!) 163/76  Pulse: 67 96  68  Resp: 18 18  18   Temp: 97.9 F (36.6 C) 97.6 F (36.4 C)    TempSrc: Oral Oral    SpO2: 100% 98%  99%  Weight:   69 kg   Height:       No intake or output data in the 24 hours ending 07/13/20 0836 Last 3 Weights 07/13/2020 07/12/2020 07/11/2020  Weight (lbs) 152 lb 1.9 oz 151 lb 14.4 oz 151 lb 10.8 oz  Weight (kg) 69 kg 68.9 kg 68.8 kg      Telemetry    Sinus rhythm, intermittent afib, NSVT- Personally Reviewed  ECG    N/A  Physical Exam   GEN: chronically ill appearing frail  male in no acute distress.   Neck: No JVD Cardiac: RRR, no murmurs, rubs, or gallops.  Respiratory: Clear to auscultation bilaterally. GI: Soft, nontender, non-distended  MS: No edema; No deformity.  Neuro:  Nonfocal  Psych: Normal affect   Labs    High Sensitivity Troponin:   Recent Labs  Lab 07/05/20 0056 07/05/20 0402 07/06/20 1248 07/06/20 1523  07/06/20 1717  TROPONINIHS 112* 107* 91* 81* 86*      Chemistry Recent Labs  Lab 07/11/20 0442 07/12/20 0429 07/13/20 0501  NA 140 142 139  K 4.5 4.1 4.1  CL 109 110 107  CO2 22 21* 24  GLUCOSE 166* 111* 140*  BUN 22 23 26*  CREATININE 0.70 0.72 0.81  CALCIUM 7.8* 7.9* 7.7*  PROT 6.1* 6.0* 6.2*  ALBUMIN 1.8* 1.7* 1.8*  AST 34 36 43*  ALT 16 20 21   ALKPHOS 494* 457* 492*  BILITOT 0.7 0.5 0.5  GFRNONAA >60 >60 >60  ANIONGAP 9 11 8      Hematology Recent Labs  Lab 07/11/20 0442 07/12/20 0429 07/13/20 0501  WBC 7.3 7.5 10.6*  RBC 3.73* 3.62* 3.75*  HGB 9.2* 8.8* 9.4*  HCT 31.5* 30.3* 31.7*  MCV 84.5 83.7 84.5  MCH 24.7* 24.3* 25.1*  MCHC 29.2* 29.0* 29.7*  RDW 22.0* 21.6* 21.8*  PLT 104* 110* 97*     Radiology    MR BRAIN W WO CONTRAST  Result Date: 07/12/2020 CLINICAL DATA:  Stroke, follow-up. EXAM: MRI HEAD WITHOUT AND WITH CONTRAST TECHNIQUE: Multiplanar, multiecho pulse sequences of the brain and surrounding structures were obtained without and with intravenous contrast. CONTRAST:  51mL GADAVIST  GADOBUTROL 1 MMOL/ML IV SOLN COMPARISON:  Head CT 06/18/2020, brain MRI with contrast 05/01/2020. Brain MRI without contrast 05/01/2020. Head CT 04/30/2020. FINDINGS: Brain: Moderate generalized cerebral atrophy. Again demonstrated is a circumscribed focus of T2/FLAIR hyperintense signal abnormality within the posterior right frontal lobe white matter and extending to involve portions of the callosal body on the right. As before, there is associated mass effect with partial effacement of the right lateral ventricle. The lesion is similar to slightly increased in size as compared to the prior MRI of 814 2021, measuring 1.9 x 1.7 x 2.5 cm (AP x TV x CC) on the current exam (previously 1.8 x 1.6 x 2.3) (remeasured on prior). Internal regions of restricted diffusion are more conspicuous as compared to the prior study (series 6, image 38). Additionally, the lesion demonstrates  progression of patchy and irregular enhancement. There is a punctate focus of restricted diffusion immediately posterior to the lesion, which may reflect an acute infarct or satellite focus of tumor (series 5, image 90). Redemonstrated small scattered chronic cortical infarcts within the right frontal and parietal lobes, as well as right temporal occipital lobe. Redemonstrated tiny chronic infarct within the left cerebellar hemisphere. No chronic intracranial blood products. No extra-axial fluid collection. No midline shift. Vascular: Expected proximal arterial flow voids. Skull and upper cervical spine: New from the prior MRI of 04/30/2020, there is extensive abnormal T1 hypointense marrow signal within the visualized upper cervical spine. Sinuses/Orbits: Visualized orbits show no acute finding. Mild scattered paranasal sinus mucosal thickening. Trace fluid within bilateral mastoid air cells. These results will be called to the ordering clinician or representative by the Radiologist Assistant, and communication documented in the PACS or [employer]. IMPRESSION: 1.9 x 1.7 x 2.5 cm enhancing lesion within the posterior right frontal lobe white matter with extension into the adjacent callosal body. This lesion is similar to slightly increased in size as compared to the prior brain MRI of 04/30/2020 and demonstrates persistent mass effect with partial effacement of the right lateral ventricle. Additionally, there has been an interval progression of associated irregular enhancement and restricted diffusion. These findings raise strong suspicion for an intracranial neoplasm (i.e. glioma). Neuro-Oncology consultation is recommended. There is a punctate focus of restricted diffusion immediately posterior to this lesion within the posterior right frontal lobe, which may reflect an acute infarct or possibly a small satellite tumor nodule. Moderate generalized cerebral atrophy. Redemonstrated small chronic cortically  based infarcts within the right frontal, parietal and temporal occipital lobes. New from the prior examination, there is extensive abnormal T1 hypointense marrow signal throughout the visualized upper cervical spine. Primary differential considerations include red marrow reconversion, osseous metastatic disease or a marrow infiltrative process. Electronically Signed   By: Kellie Simmering DO   On: 07/12/2020 12:40    Cardiac Studies   Echo 07/07/2020 1. Since the last study on 05/01/2020 LVEF has decreased 50-55% to 40-45%  with diffuse hypokinesis and akinesis in the basal and mid inferior and  inferolateral walls.  2. Left ventricular ejection fraction, by estimation, is 40 to 45%. The  left ventricle has mildly decreased function. The left ventricle  demonstrates regional wall motion abnormalities (see scoring  diagram/findings for description). There is mild  concentric left ventricular hypertrophy. Left ventricular diastolic  parameters are consistent with Grade I diastolic dysfunction (impaired  relaxation).  3. Right ventricular systolic function is normal. The right ventricular  size is normal. There is normal pulmonary artery systolic pressure.  4. Left atrial size was  mildly dilated.  5. The mitral valve is normal in structure. Mild mitral valve  regurgitation. No evidence of mitral stenosis.  6. The aortic valve is normal in structure. Aortic valve regurgitation is  not visualized. No aortic stenosis is present.  7. The inferior vena cava is normal in size with greater than 50%  respiratory variability, suggesting right atrial pressure of 3 mmHg.   Patient Profile     Adrian Singh is a 86 y.o. male with a hx of CAD s/p PCI/DES to LCx in 2017, paroxysmal atrial fibrillation, HTN c/b orthostatic hypotension, HLD, DM type 2, CVA, metastatic prostate cancer who is being seen today for the evaluation of atrial fibrillation with RVR.   His last ischemic evaluation was a [facility]  02/2016 which showed patent LCx stent with non-obstructive mild RCA and LAD disease.   He was admitted 04/2020 with an acute CVA c/b HCAP. Multiple admission afterwards for orthostatic hypotension.   The patient was admitted 07/04/2020 for ileus. Echo this admission showed EF 40-45%, RWMA with inferior/posterior/basal inferoseptal wall akinesis, G1DD, mild concentric LVH, mild LAE, and mild MR. Seen by Dr. Sallyanne Kuster 07/08/20 for abnormal echo felt that no acute wall motion changes. Last seen by Dr. Harrell Gave 07/09/2020 who agreed with Dr. Lurline Del recommendations and signed off. Per note "with anemia, overall poor functional status, flat hsTni, and improvement in his pain post decompression, no indication for ischemic evaluation at this time" . No anticoagulation recommended.   Cardiology is asked to see again for afib RVR.   Assessment & Plan    1. Atrial fibrillation with RVR - Intermittent afib this admission  - Due to persistent elevated HR despite being on cardizem drip he was given Digoxin 0.25mg  x 2 yesterday, started Amiodarone 200mg  BID and increased lopressor to 50mg  BID>> converted to sinus yesterday round 9:30pm and maintaining sinus  - CHA2DS2/VAS Stroke Risk Points=7 however stopped heparin and plan not to start outpatient anticoagulation indefinitely given acute illness, anemia requiring transfusion and concern for intracranial neoplasm based on prior cardiology note this admission.  - Continue current dose of BB and amiodarone for now  2. CAD - Had chest pain early this admission with echo as summarized above - No recurrent chest pain  - Continue statin and BB  3. HTN with hx of orthostatic hypotension - Avoid aggressive antihypertensive - Can try stockings    For questions or updates, please contact Emily Please consult [email or web address] for contact info under        SignedLeanor Kail, PA  07/13/2020, 8:36 AM

## 2020-07-13 NOTE — Progress Notes (Signed)
  Speech Language Pathology Treatment: Dysphagia  Patient Details Name: Adrian Singh MRN: [id number] DOB: 07/26/1934 Today's Date: 07/13/2020 Time: 1300-1320 SLP Time Calculation (min) (ACUTE ONLY): 20 min  Assessment / Plan / Recommendation Clinical Impression  Pt and daughter were receptive to education regarding pt high aspiration risk and esophageal dysmotility. Written information provided yesterday was reviewed, and  SLP provided opportunity to ask questions. Dtr was encouraged to take safe swallow precaution sheet and esophageal dysmotility sheet home with pt at DC.  No further ST intervention recommended at this time. Will continue Dys 2 diet and thin liquids, meds as tolerated. Please reconsult if needs arise.   HPI HPI: CHRISTOPHER GLASSCOCK is a 86 y.o. WM PMHx CAD status post stenting, A. 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  Denies any vomiting fever or chills.  Abdominal discomfort is generalized.  Was able to take his medications. CXR 10/22 concerning for LLL pna.  Pt with hx of silent aspiration with MBS 05/04/20 revealing post prandial silent aspiration of all consistencies of liquid.      SLP Plan  Discharge SLP treatment due to goals met       Recommendations  Diet recommendations: Dysphagia 2 (fine chop);Thin liquid Liquids provided via: Cup;Straw Medication Administration: Other (Comment) (as tolerated) Supervision: Patient able to self feed;Full  supervision/cueing for compensatory strategies;Staff to assist with self feeding Compensations: Slow rate;Small sips/bites;Clear throat intermittently Postural Changes and/or Swallow Maneuvers: Seated upright 90 degrees;Upright 30-60 min after meal                Oral Care Recommendations: Oral care QID Follow up Recommendations: Skilled Nursing facility;24 hour supervision/assistance SLP Visit Diagnosis: Dysphagia, oropharyngeal phase (R13.12) Plan: Discharge SLP treatment due to goals met      [place]. Quentin Ore, [facility], [place] Speech Language Pathologist Office: [phone] Pager: [phone]   Shonna Chock 07/13/2020, 2:01 PM

## 2020-07-13 NOTE — Progress Notes (Signed)
PROGRESS NOTE    LORENZO ARSCOTT  VHQ:[id number] DOB: [birth year] DOA: 07/04/2020 PCP: [name], MD    Brief Narrative:  Patient is a 86 year old gentleman with history of coronary artery disease, paroxysmal A. fib, type 2 diabetes on Metformin, chronic anemia, history of a stroke with residual dysarthria and left-sided weakness, metastatic prostate cancer and significant orthostatic hypotension.  He was recently admitted multiple times to the hospital with orthostatic hypotension.  Presented to the ER with 2 days of increasing abdominal discomfort and distention.  Admitted to the hospital with ileus that has resolved.  Hospitalization complicated with aspiration pneumonia and A. fib with RVR.  Remains in very poor clinical status.   Assessment & Plan:   Principal Problem:   Ileus ([place]) Active Problems:   Essential hypertension   Uncontrolled type 2 diabetes mellitus with diabetic polyneuropathy, without long-term current use of insulin (HCC)   CKD stage 3 due to type 2 diabetes mellitus ([place])   History of CVA (cerebrovascular accident)   CAD S/P percutaneous coronary angioplasty   Prostate cancer ([place])   Weakness generalized   Elevated troponin   Pressure injury of skin   Severe protein-calorie malnutrition ([place])   Adult failure to thrive   Hypokalemia   Atrial fibrillation with RVR (HCC)  Ileus: Initial CT scan consistent with ileus.  Treated conservatively with improvement.  Now with normal bowel function.  Paroxysmal A. fib with RVR: Started with metoprolol, inadequate rate control.  Developed RVR on 10/26. Treated with amiodarone and now converted to sinus rhythm.  Will continue amiodarone 200 mg twice a day, metoprolol 50 mg 3 times a day. Given frailty and poor functional status, high risk for bleeding with anticoagulation.  On hold for now.  Also concern for intracranial neoplasm.  HCAP: Initially treated as community-acquired pneumonia with ceftriaxone and  doxycycline.  Suspected aspiration.  Cultures are negative.  Clinical improvement now.  Doxycycline for 7 days.  Electrolyte abnormalities: Potassium and phosphate was replaced and are adequate.  Chronic systolic heart failure: Currently euvolemic.  Chronic anemia and thrombocytopenia: Anemia of chronic disease.  Received 1 unit of PRBC during this hospitalization.  Suspected intracranial neoplasm: Has a history of CVA with residual left-sided weakness and dysarthria.  Abnormal MRI brain on 8/21. Repeat MRI of the brain this admission showed 1.9 x 2.5 cm enhancing lesion within posterior right frontal lobe suspect for malignancy. Seen by neuro-oncology, recommended surveillance and they will follow.  Goals of care: Remains in overall poor clinical situation with progressive debility and multiple medical problems.  Plan is to transfer to a skilled level of care in an attempt to do inpatient therapies with outpatient palliative care follow-up.   DVT prophylaxis: Place TED hose Start: 07/13/20 1133 Place and maintain sequential compression device Start: 07/08/20 0825   Code Status: Partial Family Communication: Daughter at bedside Disposition Plan: Status is: Inpatient  Remains inpatient appropriate because:Unsafe d/c plan   Dispo: The patient is from: Home              Anticipated d/c is to: SNF              Anticipated d/c date is: 2 days              Patient currently is not medically stable to d/c.         Consultants:   Cardiology  Neuro oncology  Palliative medicine  Procedures:   None  Antimicrobials:  Antibiotics Given (last 72 hours)  Date/Time Action Medication Dose Rate   07/10/20 2205 Given   doxycycline (VIBRA-TABS) tablet 100 mg 100 mg    07/11/20 8144 Given   doxycycline (VIBRA-TABS) tablet 100 mg 100 mg    07/11/20 1255 New Bag/Given   cefTRIAXone (ROCEPHIN) 2 g in sodium chloride 0.9 % 100 mL IVPB 2 g 200 mL/hr   07/11/20 2314 Given    doxycycline (VIBRA-TABS) tablet 100 mg 100 mg    07/12/20 8185 Given   doxycycline (VIBRA-TABS) tablet 100 mg 100 mg    07/12/20 1255 New Bag/Given   cefTRIAXone (ROCEPHIN) 2 g in sodium chloride 0.9 % 100 mL IVPB 2 g 200 mL/hr   07/12/20 2102 Given   doxycycline (VIBRA-TABS) tablet 100 mg 100 mg    07/13/20 6314 Given   doxycycline (VIBRA-TABS) tablet 100 mg 100 mg          Subjective: Patient was seen and examined.  No overnight events.  Poor historian.  He himself denied any complaints.  Denied any chest pain or palpitations. Appetite is poor.  Mobility is very poor and needed 2 people to get him out of the bed. Telemetry shows sinus rhythm. Repeat examined with daughter at the bedside.  He was sleepy and tired.  Objective: Vitals:   07/12/20 2005 07/13/20 0500 07/13/20 0542 07/13/20 1333  BP: (!) 146/72  (!) 163/76 (!) 150/72  Pulse: 96  68 68  Resp: 18  18 20   Temp: 97.6 F (36.4 C)   (!) 97.3 F (36.3 C)  TempSrc: Oral   Oral  SpO2: 98%  99% 98%  Weight:  69 kg    Height:        Intake/Output Summary (Last 24 hours) at 07/13/2020 1407 Last data filed at 07/13/2020 0902 Gross per 24 hour  Intake 120 ml  Output --  Net 120 ml   Filed Weights   07/11/20 0413 07/12/20 0453 07/13/20 0500  Weight: 68.8 kg 68.9 kg 69 kg    Examination:  General exam: Acute and chronically sick looking gentleman, very frail. He has some baseline dysarthria so somehow difficult to talk. Respiratory system: Clear to auscultation. Respiratory effort normal.  Mostly conducted airway sounds. Cardiovascular system: S1 & S2 heard, RRR. No JVD, murmurs, rubs, gallops or clicks. No pedal edema. Gastrointestinal system: Nondistended.  Bowel sounds present. Central nervous system: Alert and oriented.  Generalized weakness.     Data Reviewed: I have personally reviewed following labs and imaging studies  CBC: Recent Labs  Lab 07/09/20 0508 07/10/20 0527 07/11/20 0442 07/12/20 0429  07/13/20 0501  WBC 5.9 5.4 7.3 7.5 10.6*  NEUTROABS 4.1 3.4 5.3 5.1 7.1  HGB 8.5* 8.5* 9.2* 8.8* 9.4*  HCT 27.2* 28.1* 31.5* 30.3* 31.7*  MCV 80.0 82.4 84.5 83.7 84.5  PLT 131* 123* 104* 110* 97*   Basic Metabolic Panel: Recent Labs  Lab 07/09/20 0508 07/10/20 0527 07/11/20 0442 07/12/20 0429 07/13/20 0501  NA 144 143 140 142 139  K 3.0* 3.4* 4.5 4.1 4.1  CL 111 108 109 110 107  CO2 24 24 22  21* 24  GLUCOSE 85 148* 166* 111* 140*  BUN 15 20 22 23  26*  CREATININE 0.63 0.68 0.70 0.72 0.81  CALCIUM 7.5* 7.6* 7.8* 7.9* 7.7*  MG 1.9 2.0 1.9 1.9 2.0  PHOS 2.0* 2.3* 2.5 3.0 3.3   GFR: Estimated Creatinine Clearance: 67.4 mL/min (by C-G formula based on SCr of 0.81 mg/dL). Liver Function Tests: Recent Labs  Lab 07/09/20 [phone] 07/10/20 6378  07/11/20 0442 07/12/20 0429 07/13/20 0501  AST 58* 37 34 36 43*  ALT 16 16 16 20 21   ALKPHOS 745* 559* 494* 457* 492*  BILITOT 0.6 0.9 0.7 0.5 0.5  PROT 6.4* 6.0* 6.1* 6.0* 6.2*  ALBUMIN 1.9* 1.8* 1.8* 1.7* 1.8*   No results for input(s): LIPASE, AMYLASE in the last 168 hours. No results for input(s): AMMONIA in the last 168 hours. Coagulation Profile: No results for input(s): INR, PROTIME in the last 168 hours. Cardiac Enzymes: No results for input(s): CKTOTAL, CKMB, CKMBINDEX, TROPONINI in the last 168 hours. BNP (last 3 results) No results for input(s): PROBNP in the last 8760 hours. HbA1C: No results for input(s): HGBA1C in the last 72 hours. CBG: Recent Labs  Lab 07/12/20 1959 07/13/20 0015 07/13/20 0435 07/13/20 0726 07/13/20 1118  GLUCAP 159* 145* 125* 119* 150*   Lipid Profile: No results for input(s): CHOL, HDL, LDLCALC, TRIG, CHOLHDL, LDLDIRECT in the last 72 hours. Thyroid Function Tests: No results for input(s): TSH, T4TOTAL, FREET4, T3FREE, THYROIDAB in the last 72 hours. Anemia Panel: No results for input(s): VITAMINB12, FOLATE, FERRITIN, TIBC, IRON, RETICCTPCT in the last 72 hours. Sepsis Labs: No  results for input(s): PROCALCITON, LATICACIDVEN in the last 168 hours.  Recent Results (from the past 240 hour(s))  Respiratory Panel by RT PCR (Flu A&B, Covid) - Nasopharyngeal Swab     Status: None   Collection Time: 07/04/20 10:56 PM   Specimen: Nasopharyngeal Swab  Result Value Ref Range Status   SARS Coronavirus 2 by RT PCR NEGATIVE NEGATIVE Final    Comment: (NOTE) SARS-CoV-2 target nucleic acids are NOT DETECTED.  The SARS-CoV-2 RNA is generally detectable in upper respiratoy specimens during the acute phase of infection. The lowest concentration of SARS-CoV-2 viral copies this assay can detect is 131 copies/mL. A negative result does not preclude SARS-Cov-2 infection and should not be used as the sole basis for treatment or other patient management decisions. A negative result may occur with  improper specimen collection/handling, submission of specimen other than nasopharyngeal swab, presence of viral mutation(s) within the areas targeted by this assay, and inadequate number of viral copies (<131 copies/mL). A negative result must be combined with clinical observations, patient history, and epidemiological information. The expected result is Negative.  Fact Sheet for Patients:  PinkCheek.be  Fact Sheet for Healthcare Providers:  GravelBags.it  This test is no t yet approved or cleared by the [place] FDA and  has been authorized for detection and/or diagnosis of SARS-CoV-2 by FDA under an Emergency Use Authorization (EUA). This EUA will remain  in effect (meaning this test can be used) for the duration of the COVID-19 declaration under Section 564(b)(1) of the Act, 21 U.S.C. section 360bbb-3(b)(1), unless the authorization is terminated or revoked sooner.     Influenza A by PCR NEGATIVE NEGATIVE Final   Influenza B by PCR NEGATIVE NEGATIVE Final    Comment: (NOTE) The Xpert Xpress SARS-CoV-2/FLU/RSV assay  is intended as an aid in  the diagnosis of influenza from Nasopharyngeal swab specimens and  should not be used as a sole basis for treatment. Nasal washings and  aspirates are unacceptable for Xpert Xpress SARS-CoV-2/FLU/RSV  testing.  Fact Sheet for Patients: PinkCheek.be  Fact Sheet for Healthcare Providers: GravelBags.it  This test is not yet approved or cleared by the [place] FDA and  has been authorized for detection and/or diagnosis of SARS-CoV-2 by  FDA under an Emergency Use Authorization (EUA). This EUA will remain  in effect (meaning this test can be used) for the duration of the  Covid-19 declaration under Section 564(b)(1) of the Act, 21  U.S.C. section 360bbb-3(b)(1), unless the authorization is  terminated or revoked. Performed at [facility], [place] [address] [place], [place] [address]   MRSA PCR Screening     Status: None   Collection Time: 07/05/20  6:08 AM   Specimen: Nasopharyngeal  Result Value Ref Range Status   MRSA by PCR NEGATIVE NEGATIVE Final    Comment:        The GeneXpert MRSA Assay (FDA approved for NASAL specimens only), is one component of a comprehensive MRSA colonization surveillance program. It is not intended to diagnose MRSA infection nor to guide or monitor treatment for MRSA infections. Performed at [facility], [place] [address] [place], [place] [address]   MRSA PCR Screening     Status: None   Collection Time: 07/08/20  3:00 PM   Specimen: Nasal Mucosa; Nasopharyngeal  Result Value Ref Range Status   MRSA by PCR NEGATIVE NEGATIVE Final    Comment:        The GeneXpert MRSA Assay (FDA approved for NASAL specimens only), is one component of a comprehensive MRSA colonization surveillance program. It is not intended to diagnose MRSA infection nor to guide or monitor treatment for MRSA infections. Performed at [facility], [place] [address] [place], [place] [address]          Radiology Studies: MR BRAIN W WO CONTRAST  Result Date: 07/12/2020 CLINICAL DATA:  Stroke, follow-up. EXAM: MRI HEAD WITHOUT AND WITH CONTRAST TECHNIQUE: Multiplanar, multiecho pulse sequences of the brain and surrounding structures were obtained without and with intravenous contrast. CONTRAST:  55mL GADAVIST GADOBUTROL 1 MMOL/ML IV SOLN COMPARISON:  Head CT 06/18/2020, brain MRI with contrast 05/01/2020. Brain MRI without contrast 05/01/2020. Head CT 04/30/2020. FINDINGS: Brain: Moderate generalized cerebral atrophy. Again demonstrated is a circumscribed focus of T2/FLAIR hyperintense signal abnormality within the posterior right frontal lobe white matter and extending to involve portions of the callosal body on the right. As before, there is associated mass effect with partial effacement of the right lateral ventricle. The lesion is similar to slightly increased in size as compared to the prior MRI of 814 2021, measuring 1.9 x 1.7 x 2.5 cm (AP x TV x CC) on the current exam (previously 1.8 x 1.6 x 2.3) (remeasured on prior). Internal regions of restricted diffusion are more conspicuous as compared to the prior study (series 6, image 38). Additionally, the lesion demonstrates progression of patchy and irregular enhancement. There is a punctate focus of restricted diffusion immediately posterior to the lesion, which may reflect an acute infarct or satellite focus of tumor (series 5, image 90). Redemonstrated small scattered chronic cortical infarcts within the right frontal and parietal lobes, as well as right temporal occipital lobe. Redemonstrated tiny chronic infarct within the left cerebellar hemisphere. No chronic intracranial blood products. No extra-axial fluid collection. No midline shift. Vascular: Expected proximal arterial flow voids. Skull and upper cervical spine: New from the prior MRI of 04/30/2020, there is extensive  abnormal T1 hypointense marrow signal within the visualized upper cervical spine. Sinuses/Orbits: Visualized orbits show no acute finding. Mild scattered paranasal sinus mucosal thickening. Trace fluid within bilateral mastoid air cells. These results will be called to the ordering clinician or representative by the Radiologist Assistant, and communication documented in the PACS or [employer]. IMPRESSION: 1.9 x 1.7 x 2.5 cm  enhancing lesion within the posterior right frontal lobe white matter with extension into the adjacent callosal body. This lesion is similar to slightly increased in size as compared to the prior brain MRI of 04/30/2020 and demonstrates persistent mass effect with partial effacement of the right lateral ventricle. Additionally, there has been an interval progression of associated irregular enhancement and restricted diffusion. These findings raise strong suspicion for an intracranial neoplasm (i.e. glioma). Neuro-Oncology consultation is recommended. There is a punctate focus of restricted diffusion immediately posterior to this lesion within the posterior right frontal lobe, which may reflect an acute infarct or possibly a small satellite tumor nodule. Moderate generalized cerebral atrophy. Redemonstrated small chronic cortically based infarcts within the right frontal, parietal and temporal occipital lobes. New from the prior examination, there is extensive abnormal T1 hypointense marrow signal throughout the visualized upper cervical spine. Primary differential considerations include red marrow reconversion, osseous metastatic disease or a marrow infiltrative process. Electronically Signed   By: Kellie Simmering DO   On: 07/12/2020 12:40        Scheduled Meds: . amiodarone  200 mg Oral BID  . atorvastatin  20 mg Oral Daily  . Chlorhexidine Gluconate Cloth  6 each Topical Daily  . diclofenac Sodium  2 g Topical QID  . doxycycline  100 mg Oral Q12H  . feeding supplement  1  Container Oral Q24H  . feeding supplement  237 mL Oral BID BM  . insulin aspart  0-9 Units Subcutaneous Q4H  . lidocaine  1 patch Transdermal Q24H  . metoprolol tartrate  50 mg Oral Q8H  . pantoprazole  40 mg Oral Daily  . traMADol  50 mg Oral Q6H   Continuous Infusions:   LOS: 8 days    Time spent: 30 minutes    Barb Merino, MD [facility] Pager [phone]

## 2020-07-14 DIAGNOSIS — I10 Essential (primary) hypertension: Secondary | ICD-10-CM | POA: Diagnosis not present

## 2020-07-14 DIAGNOSIS — I4891 Unspecified atrial fibrillation: Secondary | ICD-10-CM | POA: Diagnosis not present

## 2020-07-14 DIAGNOSIS — R531 Weakness: Secondary | ICD-10-CM | POA: Diagnosis not present

## 2020-07-14 DIAGNOSIS — R627 Adult failure to thrive: Secondary | ICD-10-CM | POA: Diagnosis not present

## 2020-07-14 DIAGNOSIS — I2510 Atherosclerotic heart disease of native coronary artery without angina pectoris: Secondary | ICD-10-CM | POA: Diagnosis not present

## 2020-07-14 DIAGNOSIS — E43 Unspecified severe protein-calorie malnutrition: Secondary | ICD-10-CM | POA: Diagnosis not present

## 2020-07-14 DIAGNOSIS — K567 Ileus, unspecified: Secondary | ICD-10-CM | POA: Diagnosis not present

## 2020-07-14 LAB — GLUCOSE, CAPILLARY
Glucose-Capillary: 116 mg/dL — ABNORMAL HIGH (ref 70–99)
Glucose-Capillary: 116 mg/dL — ABNORMAL HIGH (ref 70–99)
Glucose-Capillary: 135 mg/dL — ABNORMAL HIGH (ref 70–99)
Glucose-Capillary: 152 mg/dL — ABNORMAL HIGH (ref 70–99)
Glucose-Capillary: 168 mg/dL — ABNORMAL HIGH (ref 70–99)
Glucose-Capillary: 175 mg/dL — ABNORMAL HIGH (ref 70–99)

## 2020-07-14 MED ORDER — CYANOCOBALAMIN 1000 MCG PO TABS: 1000.0000 ug | ORAL_TABLET | Freq: Every day | ORAL | Status: AC

## 2020-07-14 MED ORDER — AMIODARONE HCL 200 MG PO TABS
200.0000 mg | ORAL_TABLET | Freq: Two times a day (BID) | ORAL | 1 refills | Status: DC
Start: 2020-07-14 — End: 2020-07-27

## 2020-07-14 MED ORDER — METOPROLOL TARTRATE 50 MG PO TABS
75.0000 mg | ORAL_TABLET | Freq: Two times a day (BID) | ORAL | Status: DC
  Administered 2020-07-14 – 2020-07-15 (×2): 75 mg via ORAL
  Filled 2020-07-14 (×2): qty 1

## 2020-07-14 MED ORDER — METOPROLOL TARTRATE 75 MG PO TABS: 75.0000 mg | ORAL_TABLET | Freq: Two times a day (BID) | ORAL | 2 refills | Status: DC

## 2020-07-14 NOTE — Progress Notes (Signed)
Progress Note  Patient Name: Adrian Singh Date of Encounter: 07/14/2020  [facility] Cardiologist: Peter Martinique, MD   Subjective   No chest pain or dyspnea. Has compression hose and abdominal binder.   Inpatient Medications    Scheduled Meds: . amiodarone  200 mg Oral BID  . atorvastatin  20 mg Oral Daily  . Chlorhexidine Gluconate Cloth  6 each Topical Daily  . diclofenac Sodium  2 g Topical QID  . doxycycline  100 mg Oral Q12H  . feeding supplement  1 Container Oral Q24H  . feeding supplement  237 mL Oral BID BM  . insulin aspart  0-9 Units Subcutaneous Q4H  . lidocaine  1 patch Transdermal Q24H  . metoprolol tartrate  50 mg Oral Q8H  . nystatin  5 mL Mouth/Throat QID  . pantoprazole  40 mg Oral Daily  . traMADol  50 mg Oral Q6H   Continuous Infusions:  PRN Meds: acetaminophen **OR** acetaminophen, benzonatate, hydrALAZINE, metoprolol tartrate, morphine injection, nitroGLYCERIN, ondansetron (ZOFRAN) IV, phenol   Vital Signs    Vitals:   07/13/20 1333 07/13/20 2021 07/14/20 0441 07/14/20 0500  BP: (!) 150/72 (!) 142/71 (!) 155/64   Pulse: 68 65 64   Resp: 20 18 16    Temp: (!) 97.3 F (36.3 C) 97.8 F (36.6 C) 97.8 F (36.6 C)   TempSrc: Oral Oral Oral   SpO2: 98% 99% 97%   Weight:    70.8 kg  Height:        Intake/Output Summary (Last 24 hours) at 07/14/2020 0829 Last data filed at 07/14/2020 0440 Gross per 24 hour  Intake 120 ml  Output 450 ml  Net -330 ml   Last 3 Weights 07/14/2020 07/13/2020 07/12/2020  Weight (lbs) 156 lb 1.4 oz 152 lb 1.9 oz 151 lb 14.4 oz  Weight (kg) 70.8 kg 69 kg 68.9 kg      Telemetry    NSR - Personally Reviewed  ECG      Physical Exam   GEN: Frail ill appearing elderly male in no acute distress.   Neck: No JVD Cardiac: RRR, no murmurs, rubs, or gallops.  Respiratory: Clear to auscultation bilaterally. GI: Soft, nontender, non-distended  MS: No edema; No deformity. Scattered ecchymosis  Neuro:   Nonfocal  Psych: Normal affect   Labs    High Sensitivity Troponin:   Recent Labs  Lab 07/05/20 0056 07/05/20 0402 07/06/20 1248 07/06/20 1523 07/06/20 1717  TROPONINIHS 112* 107* 91* 81* 86*      Chemistry Recent Labs  Lab 07/11/20 0442 07/12/20 0429 07/13/20 0501  NA 140 142 139  K 4.5 4.1 4.1  CL 109 110 107  CO2 22 21* 24  GLUCOSE 166* 111* 140*  BUN 22 23 26*  CREATININE 0.70 0.72 0.81  CALCIUM 7.8* 7.9* 7.7*  PROT 6.1* 6.0* 6.2*  ALBUMIN 1.8* 1.7* 1.8*  AST 34 36 43*  ALT 16 20 21   ALKPHOS 494* 457* 492*  BILITOT 0.7 0.5 0.5  GFRNONAA >60 >60 >60  ANIONGAP 9 11 8      Hematology Recent Labs  Lab 07/11/20 0442 07/12/20 0429 07/13/20 0501  WBC 7.3 7.5 10.6*  RBC 3.73* 3.62* 3.75*  HGB 9.2* 8.8* 9.4*  HCT 31.5* 30.3* 31.7*  MCV 84.5 83.7 84.5  MCH 24.7* 24.3* 25.1*  MCHC 29.2* 29.0* 29.7*  RDW 22.0* 21.6* 21.8*  PLT 104* 110* 97*    Radiology    MR BRAIN W WO CONTRAST  Result Date: 07/12/2020 CLINICAL  DATA:  Stroke, follow-up. EXAM: MRI HEAD WITHOUT AND WITH CONTRAST TECHNIQUE: Multiplanar, multiecho pulse sequences of the brain and surrounding structures were obtained without and with intravenous contrast. CONTRAST:  72mL GADAVIST GADOBUTROL 1 MMOL/ML IV SOLN COMPARISON:  Head CT 06/18/2020, brain MRI with contrast 05/01/2020. Brain MRI without contrast 05/01/2020. Head CT 04/30/2020. FINDINGS: Brain: Moderate generalized cerebral atrophy. Again demonstrated is a circumscribed focus of T2/FLAIR hyperintense signal abnormality within the posterior right frontal lobe white matter and extending to involve portions of the callosal body on the right. As before, there is associated mass effect with partial effacement of the right lateral ventricle. The lesion is similar to slightly increased in size as compared to the prior MRI of 814 2021, measuring 1.9 x 1.7 x 2.5 cm (AP x TV x CC) on the current exam (previously 1.8 x 1.6 x 2.3) (remeasured on prior).  Internal regions of restricted diffusion are more conspicuous as compared to the prior study (series 6, image 38). Additionally, the lesion demonstrates progression of patchy and irregular enhancement. There is a punctate focus of restricted diffusion immediately posterior to the lesion, which may reflect an acute infarct or satellite focus of tumor (series 5, image 90). Redemonstrated small scattered chronic cortical infarcts within the right frontal and parietal lobes, as well as right temporal occipital lobe. Redemonstrated tiny chronic infarct within the left cerebellar hemisphere. No chronic intracranial blood products. No extra-axial fluid collection. No midline shift. Vascular: Expected proximal arterial flow voids. Skull and upper cervical spine: New from the prior MRI of 04/30/2020, there is extensive abnormal T1 hypointense marrow signal within the visualized upper cervical spine. Sinuses/Orbits: Visualized orbits show no acute finding. Mild scattered paranasal sinus mucosal thickening. Trace fluid within bilateral mastoid air cells. These results will be called to the ordering clinician or representative by the Radiologist Assistant, and communication documented in the PACS or [employer]. IMPRESSION: 1.9 x 1.7 x 2.5 cm enhancing lesion within the posterior right frontal lobe white matter with extension into the adjacent callosal body. This lesion is similar to slightly increased in size as compared to the prior brain MRI of 04/30/2020 and demonstrates persistent mass effect with partial effacement of the right lateral ventricle. Additionally, there has been an interval progression of associated irregular enhancement and restricted diffusion. These findings raise strong suspicion for an intracranial neoplasm (i.e. glioma). Neuro-Oncology consultation is recommended. There is a punctate focus of restricted diffusion immediately posterior to this lesion within the posterior right frontal lobe, which  may reflect an acute infarct or possibly a small satellite tumor nodule. Moderate generalized cerebral atrophy. Redemonstrated small chronic cortically based infarcts within the right frontal, parietal and temporal occipital lobes. New from the prior examination, there is extensive abnormal T1 hypointense marrow signal throughout the visualized upper cervical spine. Primary differential considerations include red marrow reconversion, osseous metastatic disease or a marrow infiltrative process. Electronically Signed   By: Kellie Simmering DO   On: 07/12/2020 12:40    Cardiac Studies   Echo 07/07/2020 1. Since the last study on 05/01/2020 LVEF has decreased 50-55% to 40-45%  with diffuse hypokinesis and akinesis in the basal and mid inferior and  inferolateral walls.  2. Left ventricular ejection fraction, by estimation, is 40 to 45%. The  left ventricle has mildly decreased function. The left ventricle  demonstrates regional wall motion abnormalities (see scoring  diagram/findings for description). There is mild  concentric left ventricular hypertrophy. Left ventricular diastolic  parameters are consistent with Grade I  diastolic dysfunction (impaired  relaxation).  3. Right ventricular systolic function is normal. The right ventricular  size is normal. There is normal pulmonary artery systolic pressure.  4. Left atrial size was mildly dilated.  5. The mitral valve is normal in structure. Mild mitral valve  regurgitation. No evidence of mitral stenosis.  6. The aortic valve is normal in structure. Aortic valve regurgitation is  not visualized. No aortic stenosis is present.  7. The inferior vena cava is normal in size with greater than 50%  respiratory variability, suggesting right atrial pressure of 3 mmHg.   Patient Profile     Adrian Singh is a 86 y.o. male with a hx of CAD s/p PCI/DES to LCx in 2017, paroxysmal atrial fibrillation, HTN c/b orthostatic hypotension, HLD, DM type 2,  CVA, metastatic prostate cancer who is being seen today for the evaluation of atrial fibrillation with RVR.   His last ischemic evaluation was a [facility] 02/2016 which showed patent LCx stent with non-obstructive mild RCA and LAD disease.  He was admitted 04/2020 with an acute CVA c/b HCAP. Multiple admission afterwards for orthostatic hypotension.   The patient was admitted 07/04/2020 for ileus. Echo this admission showed EF 40-45%, RWMA with inferior/posterior/basal inferoseptal wall akinesis, G1DD, mild concentric LVH, mild LAE, and mild MR. Seen by Dr. Sallyanne Kuster 07/08/20 for abnormal echo felt that no acute wall motion changes. Last seen by Dr. Harrell Gave 07/09/2020 who agreed with Dr. Lurline Del recommendations and signed off. Per note "with anemia, overall poor functional status, flat hsTni, and improvement in his pain post decompression, no indication for ischemic evaluation at this time" . No anticoagulation recommended.   Cardiology is asked to see again for afib RVR.   Assessment & Plan    1. Atrial fibrillation with RVR - Intermittent afib this admission  - Due to persistent elevated HR despite being on cardizem drip he was given Digoxin 0.25mg  x 2, started Amiodarone 200mg  BID and increased lopressor >> converted to sinus and maintaining it.  - CHA2DS2/VAS Stroke Risk Points=7 however stopped heparin and plan not to start outpatient anticoagulation indefinitely given acute illness, anemia requiring transfusion and concern for intracranial neoplasm based on prior cardiology note this admission.  - Continue BB and amiodarone (dosage per MD)  2. CAD/ Abnormal echo - Had chest pain early this admission with echo as summarized above - No recurrent chest pain  - Continue statin and BB  3. HTN with hx of orthostatic hypotension - Avoid aggressive antihypertensive -  Now on compression hose and abdominal binder -   He was orthostatic yesterday evening around 5 pm laying 143/74 to sitting  112/60.  - Check orthostatics this morning.     For questions or updates, please contact [place] Please consult [email or web address] for contact info under        SignedLeanor Kail, PA  07/14/2020, 8:29 AM

## 2020-07-14 NOTE — Progress Notes (Signed)
PROGRESS NOTE    Adrian Singh  PTW:[id number] DOB: 06-23-1934 DOA: 07/04/2020 PCP: [name], MD    Brief Narrative:  Patient is a 86 year old gentleman with history of coronary artery disease, paroxysmal A. fib, type 2 diabetes on Metformin, chronic anemia, history of a stroke with residual dysarthria and left-sided weakness, metastatic prostate cancer and significant orthostatic hypotension.  He was recently admitted multiple times to the hospital with orthostatic hypotension.  Presented to the ER with 2 days of increasing abdominal discomfort and distention.  Admitted to the hospital with ileus that has resolved.  Hospitalization complicated with aspiration pneumonia and A. fib with RVR.  Remains in very poor clinical status.   Assessment & Plan:   Principal Problem:   Ileus ([place]) Active Problems:   Essential hypertension   Uncontrolled type 2 diabetes mellitus with diabetic polyneuropathy, without long-term current use of insulin (HCC)   CKD stage 3 due to type 2 diabetes mellitus ([place])   History of CVA (cerebrovascular accident)   CAD S/P percutaneous coronary angioplasty   Prostate cancer ([place])   Weakness generalized   Elevated troponin   Pressure injury of skin   Severe protein-calorie malnutrition ([place])   Adult failure to thrive   Hypokalemia   Atrial fibrillation with RVR (HCC)  Ileus: Initial CT scan consistent with ileus.  Treated conservatively with improvement.  Now with normal bowel function.  Patient on dysphagia 2 diet with aspiration precautions.  Paroxysmal A. fib with RVR: Started with metoprolol, inadequate rate control.  Developed RVR on 10/26. Treated with amiodarone and now converted to sinus rhythm.  Will continue amiodarone 200 mg twice a day, metoprolol 50 mg 3 times a day. As per cardiology recommendation, will change to metoprolol 75 mg 2 times a day. Given frailty and poor functional status, high risk for bleeding with anticoagulation.  On  hold for now.  Also concern for intracranial neoplasm.  HCAP: Initially treated as community-acquired pneumonia with ceftriaxone and doxycycline.  Suspected aspiration.  Cultures are negative.  Clinical improvement now.  Completed antibiotics.  Very high risk of aspiration and decompensation.  Electrolyte abnormalities: Potassium and phosphate was replaced and are adequate.  Chronic systolic heart failure: Currently euvolemic.  Chronic anemia and thrombocytopenia: Anemia of chronic disease.  Received 1 unit of PRBC during this hospitalization.  Suspected intracranial neoplasm: Has a history of CVA with residual left-sided weakness and dysarthria.  Abnormal MRI brain on 8/21. Repeat MRI of the brain this admission showed 1.9 x 2.5 cm enhancing lesion within posterior right frontal lobe suspect for malignancy. Seen by neuro-oncology, recommended surveillance and they will follow.  Goals of care: Remains in overall poor clinical situation with progressive debility and multiple medical problems.   Family decided to take him home with maximizing home health care and therapies. We will allow time for family to make arrangements at home. Will benefit with skilled RN, PT OT and home health aide. Patient will benefit with outpatient palliative care follow-up, will refer on discharge.  DVT prophylaxis: Place TED hose Start: 07/13/20 1133 Place and maintain sequential compression device Start: 07/08/20 0825   Code Status: Partial Family Communication: Daughter [name] at bedside Disposition Plan: Status is: Inpatient  Remains inpatient appropriate because:Unsafe d/c plan   Dispo: The patient is from: Home              Anticipated d/c is to: Home with home health care  Anticipated d/c date is: 1 to 2 days.              Patient currently is not medically stable to d/c.  Pending arrangements at home for safe discharge.         Consultants:   Cardiology  Neuro oncology   Palliative medicine  Procedures:   None  Antimicrobials:  Antibiotics Given (last 72 hours)    Date/Time Action Medication Dose Rate   07/11/20 1255 New Bag/Given   cefTRIAXone (ROCEPHIN) 2 g in sodium chloride 0.9 % 100 mL IVPB 2 g 200 mL/hr   07/11/20 2314 Given   doxycycline (VIBRA-TABS) tablet 100 mg 100 mg    07/12/20 1660 Given   doxycycline (VIBRA-TABS) tablet 100 mg 100 mg    07/12/20 1255 New Bag/Given   cefTRIAXone (ROCEPHIN) 2 g in sodium chloride 0.9 % 100 mL IVPB 2 g 200 mL/hr   07/12/20 2102 Given   doxycycline (VIBRA-TABS) tablet 100 mg 100 mg    07/13/20 6301 Given   doxycycline (VIBRA-TABS) tablet 100 mg 100 mg    07/13/20 2136 Given   doxycycline (VIBRA-TABS) tablet 100 mg 100 mg    07/14/20 0913 Given   doxycycline (VIBRA-TABS) tablet 100 mg 100 mg          Subjective: Seen and examined.  No overnight events.  Patient himself feels terribly weak otherwise no particular symptoms.  Telemetry shows heart rate fairly controlled with occasional tachycardia. He needed 2 people to sit him at the edge of the bed. Eating without trouble with assistance. Patient's daughter [name] at the bedside.  Discussion with multidisciplinary team, palliative medicine, speech therapy at the bedside. Family really does not want patient to go to a skilled nursing home.  Objective: Vitals:   07/13/20 1333 07/13/20 2021 07/14/20 0441 07/14/20 0500  BP: (!) 150/72 (!) 142/71 (!) 155/64   Pulse: 68 65 64   Resp: 20 18 16    Temp: (!) 97.3 F (36.3 C) 97.8 F (36.6 C) 97.8 F (36.6 C)   TempSrc: Oral Oral Oral   SpO2: 98% 99% 97%   Weight:    70.8 kg  Height:        Intake/Output Summary (Last 24 hours) at 07/14/2020 1101 Last data filed at 07/14/2020 0900 Gross per 24 hour  Intake 240 ml  Output 450 ml  Net -210 ml   Filed Weights   07/12/20 0453 07/13/20 0500 07/14/20 0500  Weight: 68.9 kg 69 kg 70.8 kg    Examination:  General exam: Acute and chronically sick  looking gentleman, very frail. He has some baseline dysarthria so somehow difficult to talk. Very frail and deconditioned. Respiratory system: Clear to auscultation. Respiratory effort normal.  Mostly conducted airway sounds. Cardiovascular system: S1 & S2 heard, RRR. No JVD, murmurs, rubs, gallops or clicks. No pedal edema. Gastrointestinal system: Nondistended.  Bowel sounds present. Central nervous system: Alert and oriented.  Generalized weakness.     Data Reviewed: I have personally reviewed following labs and imaging studies  CBC: Recent Labs  Lab 07/09/20 0508 07/10/20 0527 07/11/20 0442 07/12/20 0429 07/13/20 0501  WBC 5.9 5.4 7.3 7.5 10.6*  NEUTROABS 4.1 3.4 5.3 5.1 7.1  HGB 8.5* 8.5* 9.2* 8.8* 9.4*  HCT 27.2* 28.1* 31.5* 30.3* 31.7*  MCV 80.0 82.4 84.5 83.7 84.5  PLT 131* 123* 104* 110* 97*   Basic Metabolic Panel: Recent Labs  Lab 07/09/20 0508 07/10/20 0527 07/11/20 0442 07/12/20 0429 07/13/20 0501  NA 144 143  140 142 139  K 3.0* 3.4* 4.5 4.1 4.1  CL 111 108 109 110 107  CO2 24 24 22  21* 24  GLUCOSE 85 148* 166* 111* 140*  BUN 15 20 22 23  26*  CREATININE 0.63 0.68 0.70 0.72 0.81  CALCIUM 7.5* 7.6* 7.8* 7.9* 7.7*  MG 1.9 2.0 1.9 1.9 2.0  PHOS 2.0* 2.3* 2.5 3.0 3.3   GFR: Estimated Creatinine Clearance: 69.2 mL/min (by C-G formula based on SCr of 0.81 mg/dL). Liver Function Tests: Recent Labs  Lab 07/09/20 0508 07/10/20 0527 07/11/20 0442 07/12/20 0429 07/13/20 0501  AST 58* 37 34 36 43*  ALT 16 16 16 20 21   ALKPHOS 745* 559* 494* 457* 492*  BILITOT 0.6 0.9 0.7 0.5 0.5  PROT 6.4* 6.0* 6.1* 6.0* 6.2*  ALBUMIN 1.9* 1.8* 1.8* 1.7* 1.8*   No results for input(s): LIPASE, AMYLASE in the last 168 hours. No results for input(s): AMMONIA in the last 168 hours. Coagulation Profile: No results for input(s): INR, PROTIME in the last 168 hours. Cardiac Enzymes: No results for input(s): CKTOTAL, CKMB, CKMBINDEX, TROPONINI in the last 168 hours. BNP  (last 3 results) No results for input(s): PROBNP in the last 8760 hours. HbA1C: No results for input(s): HGBA1C in the last 72 hours. CBG: Recent Labs  Lab 07/13/20 1555 07/13/20 2018 07/14/20 0017 07/14/20 0434 07/14/20 0729  GLUCAP 170* 113* 135* 116* 116*   Lipid Profile: No results for input(s): CHOL, HDL, LDLCALC, TRIG, CHOLHDL, LDLDIRECT in the last 72 hours. Thyroid Function Tests: No results for input(s): TSH, T4TOTAL, FREET4, T3FREE, THYROIDAB in the last 72 hours. Anemia Panel: No results for input(s): VITAMINB12, FOLATE, FERRITIN, TIBC, IRON, RETICCTPCT in the last 72 hours. Sepsis Labs: No results for input(s): PROCALCITON, LATICACIDVEN in the last 168 hours.  Recent Results (from the past 240 hour(s))  Respiratory Panel by RT PCR (Flu A&B, Covid) - Nasopharyngeal Swab     Status: None   Collection Time: 07/04/20 10:56 PM   Specimen: Nasopharyngeal Swab  Result Value Ref Range Status   SARS Coronavirus 2 by RT PCR NEGATIVE NEGATIVE Final    Comment: (NOTE) SARS-CoV-2 target nucleic acids are NOT DETECTED.  The SARS-CoV-2 RNA is generally detectable in upper respiratoy specimens during the acute phase of infection. The lowest concentration of SARS-CoV-2 viral copies this assay can detect is 131 copies/mL. A negative result does not preclude SARS-Cov-2 infection and should not be used as the sole basis for treatment or other patient management decisions. A negative result may occur with  improper specimen collection/handling, submission of specimen other than nasopharyngeal swab, presence of viral mutation(s) within the areas targeted by this assay, and inadequate number of viral copies (<131 copies/mL). A negative result must be combined with clinical observations, patient history, and epidemiological information. The expected result is Negative.  Fact Sheet for Patients:  PinkCheek.be  Fact Sheet for Healthcare Providers:   GravelBags.it  This test is no t yet approved or cleared by the [place] FDA and  has been authorized for detection and/or diagnosis of SARS-CoV-2 by FDA under an Emergency Use Authorization (EUA). This EUA will remain  in effect (meaning this test can be used) for the duration of the COVID-19 declaration under Section 564(b)(1) of the Act, 21 U.S.C. section 360bbb-3(b)(1), unless the authorization is terminated or revoked sooner.     Influenza A by PCR NEGATIVE NEGATIVE Final   Influenza B by PCR NEGATIVE NEGATIVE Final    Comment: (NOTE) The Xpert Xpress  SARS-CoV-2/FLU/RSV assay is intended as an aid in  the diagnosis of influenza from Nasopharyngeal swab specimens and  should not be used as a sole basis for treatment. Nasal washings and  aspirates are unacceptable for Xpert Xpress SARS-CoV-2/FLU/RSV  testing.  Fact Sheet for Patients: PinkCheek.be  Fact Sheet for Healthcare Providers: GravelBags.it  This test is not yet approved or cleared by the [place] FDA and  has been authorized for detection and/or diagnosis of SARS-CoV-2 by  FDA under an Emergency Use Authorization (EUA). This EUA will remain  in effect (meaning this test can be used) for the duration of the  Covid-19 declaration under Section 564(b)(1) of the Act, 21  U.S.C. section 360bbb-3(b)(1), unless the authorization is  terminated or revoked. Performed at [facility], [place] [address], [place], [place] [address]   MRSA PCR Screening     Status: None   Collection Time: 07/05/20  6:08 AM   Specimen: Nasopharyngeal  Result Value Ref Range Status   MRSA by PCR NEGATIVE NEGATIVE Final    Comment:        The GeneXpert MRSA Assay (FDA approved for NASAL specimens only), is one component of a comprehensive MRSA colonization surveillance program. It is not intended to diagnose MRSA infection nor to  guide or monitor treatment for MRSA infections. Performed at [facility], [place] [address] [place], [place] [address]   MRSA PCR Screening     Status: None   Collection Time: 07/08/20  3:00 PM   Specimen: Nasal Mucosa; Nasopharyngeal  Result Value Ref Range Status   MRSA by PCR NEGATIVE NEGATIVE Final    Comment:        The GeneXpert MRSA Assay (FDA approved for NASAL specimens only), is one component of a comprehensive MRSA colonization surveillance program. It is not intended to diagnose MRSA infection nor to guide or monitor treatment for MRSA infections. Performed at [facility], [place] [address] [place],  [address]          Radiology Studies: MR BRAIN W WO CONTRAST  Result Date: 07/12/2020 CLINICAL DATA:  Stroke, follow-up. EXAM: MRI HEAD WITHOUT AND WITH CONTRAST TECHNIQUE: Multiplanar, multiecho pulse sequences of the brain and surrounding structures were obtained without and with intravenous contrast. CONTRAST:  75mL GADAVIST GADOBUTROL 1 MMOL/ML IV SOLN COMPARISON:  Head CT 06/18/2020, brain MRI with contrast 05/01/2020. Brain MRI without contrast 05/01/2020. Head CT 04/30/2020. FINDINGS: Brain: Moderate generalized cerebral atrophy. Again demonstrated is a circumscribed focus of T2/FLAIR hyperintense signal abnormality within the posterior right frontal lobe white matter and extending to involve portions of the callosal body on the right. As before, there is associated mass effect with partial effacement of the right lateral ventricle. The lesion is similar to slightly increased in size as compared to the prior MRI of 814 2021, measuring 1.9 x 1.7 x 2.5 cm (AP x TV x CC) on the current exam (previously 1.8 x 1.6 x 2.3) (remeasured on prior). Internal regions of restricted diffusion are more conspicuous as compared to the prior study (series 6, image 38). Additionally, the lesion demonstrates progression of patchy and irregular  enhancement. There is a punctate focus of restricted diffusion immediately posterior to the lesion, which may reflect an acute infarct or satellite focus of tumor (series 5, image 90). Redemonstrated small scattered chronic cortical infarcts within the right frontal and parietal lobes, as well as right temporal occipital lobe. Redemonstrated tiny chronic infarct within the left cerebellar hemisphere. No  chronic intracranial blood products. No extra-axial fluid collection. No midline shift. Vascular: Expected proximal arterial flow voids. Skull and upper cervical spine: New from the prior MRI of 04/30/2020, there is extensive abnormal T1 hypointense marrow signal within the visualized upper cervical spine. Sinuses/Orbits: Visualized orbits show no acute finding. Mild scattered paranasal sinus mucosal thickening. Trace fluid within bilateral mastoid air cells. These results will be called to the ordering clinician or representative by the Radiologist Assistant, and communication documented in the PACS or [employer]. IMPRESSION: 1.9 x 1.7 x 2.5 cm enhancing lesion within the posterior right frontal lobe white matter with extension into the adjacent callosal body. This lesion is similar to slightly increased in size as compared to the prior brain MRI of 04/30/2020 and demonstrates persistent mass effect with partial effacement of the right lateral ventricle. Additionally, there has been an interval progression of associated irregular enhancement and restricted diffusion. These findings raise strong suspicion for an intracranial neoplasm (i.e. glioma). Neuro-Oncology consultation is recommended. There is a punctate focus of restricted diffusion immediately posterior to this lesion within the posterior right frontal lobe, which may reflect an acute infarct or possibly a small satellite tumor nodule. Moderate generalized cerebral atrophy. Redemonstrated small chronic cortically based infarcts within the right  frontal, parietal and temporal occipital lobes. New from the prior examination, there is extensive abnormal T1 hypointense marrow signal throughout the visualized upper cervical spine. Primary differential considerations include red marrow reconversion, osseous metastatic disease or a marrow infiltrative process. Electronically Signed   By: Kellie Simmering DO   On: 07/12/2020 12:40        Scheduled Meds: . amiodarone  200 mg Oral BID  . atorvastatin  20 mg Oral Daily  . Chlorhexidine Gluconate Cloth  6 each Topical Daily  . diclofenac Sodium  2 g Topical QID  . feeding supplement  1 Container Oral Q24H  . feeding supplement  237 mL Oral BID BM  . insulin aspart  0-9 Units Subcutaneous Q4H  . lidocaine  1 patch Transdermal Q24H  . metoprolol tartrate  75 mg Oral BID  . nystatin  5 mL Mouth/Throat QID  . pantoprazole  40 mg Oral Daily  . traMADol  50 mg Oral Q6H   Continuous Infusions:   LOS: 9 days    Time spent: 30 minutes    Barb Merino, MD [facility] Pager [phone]

## 2020-07-14 NOTE — Progress Notes (Signed)
Patient ID: Adrian Singh, male   DOB: 02/22/1934, 85 y.o.   MRN: [id number]  This NP visited patient at the bedside as a follow up for palliative medicine needs and emotional support and to offer  continued education  regarding diagnosis, prognosis, goals of care, end-of-life wishes, disposition and options.  Today is day 9 of this hospitalization.    Adrian Singh remains weak with poor p.o. intake.  He is high risk for decompensation.  Patient's medical condition and current treatment plan discussed in detail with attending Dr. Kummer and bedside nursing.  I then met at the bedside along with [name].  Daughter verbalizes family is continued struggle with the next steps and transition of care.  They only want the best for him and they are frustrated with available transition of care options.  Created space and opportunity for Adrian Singh to explore her thoughts and feelings regarding skilled nursing facilities and their inability to meet patient's needs leveling up to family's expectations.  Education offered on the importance of self advocacy hands on care to augment care delivered at the facility.  Education offered on home health benefit versus hospice benefit.  Again  education offered regarding concepts of human mortality and adult failure to thrive.  Education offered on the limitations of medical interventions to prolong quality of life when the body begins to fail to thrive. I worry that family expectations are incongruent with available medical options/treatmetns  to prolong quality of life  Education offered on the difference between aggressive medical intervention path and a palliative comfort path for this patient at this time in this situation was discussed.  Plan of care: -Limit to CODE STATUS-patient does not desire intubation -Continue to treat the treatable, patient and family are hopeful for improvement -Transition to skilled nursing facility for rehab when stable/vs home  with HH -Encouraged family to add a minimum to talk with a local hospice regarding benefits  Discussed with patient the importance of continued conversation with his family and the medical providers regarding overall plan of care and treatment options,  ensuring decisions are within the context of the patients values and GOCs.  Questions and concerns addressed   Discussed with Dr Ghimire Larry  Total time spent on the unit was 35 minutes  This nurse practitioner informed  the patient/family and the attending that I will be out of the hospital until Monday morning.  If the patient is still hospitalized I will follow-up at that time.  Call palliative medicine team phone # [phone] with questions or concerns.  Greater than 50% of the time was spent in counseling and coordination of care  Mary Larach NP  Palliative Medicine Team Team Phone   

## 2020-07-14 NOTE — Evaluation (Signed)
Clinical/Bedside Swallow Evaluation Patient Details  Name: Adrian Singh MRN: [id number] Date of Birth: 1934/07/05  Today's Date: 07/14/2020 Time: SLP Start Time (ACUTE ONLY): 0109 SLP Stop Time (ACUTE ONLY): 0925 SLP Time Calculation (min) (ACUTE ONLY): 30 min  Past Medical History:  Past Medical History:  Diagnosis Date  . Arthritis    "legs, back" (10/06/2015)  . Atrial fibrillation ([place])    x1  . Atrial fibrillation ([place])   . CAD (coronary artery disease)    nonobstructive  . Cancer ([place])    skin cancer on ear (froze it off) and back (cut it off)  . Dysrhythmia   . Esophageal reflux    hx of  . History of kidney stones   . Hx of heart artery stent   . Hypertrophy of prostate without urinary obstruction and other lower urinary tract symptoms (LUTS)   . Osteoarthrosis, unspecified whether generalized or localized, unspecified site   . Prostate cancer ([place])   . Skin cancer   . Stroke [facility]) 03/2014   "had a series of mini strokes; maybe 4"; denies residual on 10/06/2015  . Thrombocytopenia ([place])   . Type II diabetes mellitus ([place])    type 2  . Unspecified essential hypertension    Past Surgical History:  Past Surgical History:  Procedure Laterality Date  . BACK SURGERY     2001  . CARDIAC CATHETERIZATION  12/2007  . CARDIAC CATHETERIZATION N/A 09/22/2015   Procedure: Left Heart Cath and Coronary Angiography;  Surgeon: Peter M Martinique, MD;  Location: [place] [facility];  Service: Cardiovascular;  Laterality: N/A;  . CARDIAC CATHETERIZATION N/A 10/06/2015   Procedure: Coronary Stent Intervention;  Surgeon: Peter M Martinique, MD;  Location: [place] [facility];  Service: Cardiovascular;  Laterality: N/A;  . CARDIAC CATHETERIZATION N/A 02/29/2016   Procedure: Left Heart Cath and Coronary Angiography;  Surgeon: Jettie Booze, MD;  Location: [place] [facility];  Service: Cardiovascular;  Laterality: N/A;  . COLONOSCOPY W/ BIOPSIES AND POLYPECTOMY    . CORONARY STENT PLACEMENT   10/06/2015   [name]  with DES  . EAR CYST EXCISION N/A 04/06/2015   Procedure: EXCISION OF SCALP CYST;  Surgeon: Donnie Mesa, MD;  Location: [place];  Service: General;  Laterality: N/A;  . ESOPHAGOGASTRODUODENOSCOPY (EGD) WITH ESOPHAGEAL DILATION  2001   with dilation  . LUMBAR DISC SURGERY  09/26/1999   "cleaned out arthritis and bone spurs"  . SHOULDER ARTHROSCOPY W/ ROTATOR CUFF REPAIR Left 2005  . THULIUM LASER TURP (TRANSURETHRAL RESECTION OF PROSTATE) N/A 01/21/2017   Procedure: Marcelino Duster LASER TURP (TRANSURETHRAL RESECTION OF PROSTATE) CAUTERIZATION OF BLADDER LESION;  Surgeon: Franchot Gallo, MD;  Location: [facility];  Service: Urology;  Laterality: N/A;   HPI:  Adrian Singh is a 86 y.o. WM PMHx CAD status post stenting, A. 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  Denies any vomiting fever or chills.  Abdominal discomfort is generalized.  Was able to take his medications. CXR 10/22 concerning for LLL pna.  Pt with hx of silent aspiration with MBS 05/04/20  revealing post prandial silent aspiration of all consistencies of liquid.   Assessment / Plan / Recommendation Clinical Impression  New order received for re-assessment and education due to difficulty swallowing and concern for oral care. Pt was awake and alert, eating oatmeal and magic cup and drinking water and gingerale. Nursing, palliative care, and MD present in the room. Pt continues to exhibit multiple swallows  and throat clearing after po intake, which is consistent with previous swallow evaluations. Safe swallow precautions still posted at [facility], which include oral, pharyngeal, and esophageal strategies to mitigate aspiration risk.   Family had expressed concern about pt's oral cavity - upon visualization, lingual surface was noted to have a "cobblestone" appearance likely due to plaque build up. Oral care was completed with suction, with successful removal of some of the adhesions. Continued aggressive oral care recommended 2-3x/day to minimize bacterial load and decrease plaque on lingual surface. Will continue Dys 2 diet and thin liquids, and encourage adherence to safe swallow precautions.   SLP Visit Diagnosis: Dysphagia, oropharyngeal phase (R13.12)    Aspiration Risk  Severe aspiration risk;Risk for inadequate nutrition/hydration    Diet Recommendation Dysphagia 2 (Fine chop);Thin liquid   Liquid Administration via: Cup;Straw Medication Administration:  (as tolerated) Supervision: Staff to assist with self feeding;Patient able to self feed;Full supervision/cueing for compensatory strategies Compensations: Slow rate;Small sips/bites;Clear throat intermittently Postural Changes: Seated upright at 90 degrees;Remain upright for at least 30 minutes after po intake    Other  Recommendations Oral Care Recommendations: Oral care QID;Staff/trained caregiver to provide oral care Other Recommendations: Have oral suction available   Follow up Recommendations Skilled Nursing facility;24 hour supervision/assistance        Swallow Study   General Date of Onset: 07/04/20 HPI: Adrian Singh is a 86 y.o. WM PMHx CAD status post stenting, A. 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  Denies any vomiting fever or chills.  Abdominal discomfort is generalized.  Was able to take his medications. CXR 10/22 concerning for LLL pna.  Pt with hx of silent aspiration with MBS 05/04/20 revealing post prandial silent aspiration of all consistencies of liquid. Type of Study: Bedside Swallow Evaluation Previous Swallow Assessment: BSE 07/11/20 - dys3/thin Diet Prior to this Study: Dysphagia 2 (chopped);Thin liquids Temperature Spikes Noted: No Respiratory Status: Room air History of Recent Intubation: No Behavior/Cognition: Alert;Cooperative;Pleasant mood Oral Cavity Assessment: Other (comment) (plaque buildup on lingual surface) Oral Care Completed by SLP: Yes Oral Cavity - Dentition: Edentulous Vision: Functional for self-feeding Self-Feeding Abilities: Able to feed self;Needs set up;Needs assist Patient Positioning: Upright in bed Baseline Vocal Quality: Normal;Low vocal intensity Volitional Cough: Weak Volitional Swallow: Able to elicit    Oral/Motor/Sensory Function Overall Oral Motor/Sensory Function: Generalized oral weakness      Thin Liquid Thin Liquid: Impaired Presentation: Straw Pharyngeal  Phase Impairments: Multiple swallows;Throat Clearing - Delayed    Puree Puree: Impaired Presentation: Spoon Pharyngeal Phase Impairments: Multiple swallows      Musa Rewerts B. Quentin Ore, [facility], [place] Speech Language Pathologist Office: [phone] Pager: [phone]  Shonna Chock 07/14/2020,9:37 AM

## 2020-07-14 NOTE — Care Management Important Message (Signed)
Important Message  Patient Details IM Letter given to the Patient Name: Adrian Singh MRN: [id number] Date of Birth: 15-Nov-1933   Medicare Important Message Given:  Yes     Kerin Salen 07/14/2020, 10:31 AM

## 2020-07-14 NOTE — TOC Progression Note (Signed)
Transition of Care [facility]) - Progression Note    Patient Details  Name: Adrian Singh MRN: [id number] Date of Birth: 1934-06-16  Transition of Care [facility]) CM/SW Contact  Purcell Mouton, RN Phone Number: 07/14/2020, 11:05 AM  Clinical Narrative:    Pt will transport home at 6:00 PM, at family request. PTAR called to setup transportation.     Expected Discharge Plan: [place] Barriers to Discharge: Barriers Unresolved (comment) (a.fib and ielus)  Expected Discharge Plan and Services Expected Discharge Plan: [place]   Discharge Planning Services: CM Consult Post Acute Care Choice: [place] Living arrangements for the past 2 months: [place] Expected Discharge Date:  (unknown)                                     Social Determinants of Health (SDOH) Interventions    Readmission Risk Interventions Readmission Risk Prevention Plan 06/20/2020 06/11/2020  Transportation Screening Complete Complete  Medication Review Press photographer) Complete Complete  PCP or Specialist appointment within 3-5 days of discharge Complete -  [place] or Home Care Consult Complete -  SW Recovery Care/Counseling Consult Complete Complete  Palliative Care Screening Not Applicable Not Applicable  Skilled Nursing Facility Complete Complete  Some recent data might be hidden

## 2020-07-14 NOTE — Progress Notes (Signed)
Occupational Therapy Treatment Patient Details Name: Adrian Singh MRN: [id number] DOB: 12/31/1933 Today's Date: 07/14/2020    History of present illness 86 y.o. WM PMHx CAD status post stenting, A. 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 Pt now admitted with Ileus.   OT comments  Treatment focused on improving strength and ROM of upper extremities and patient's ability to perform bed mobility and sit to stands to reduce caregiver burden. Patient max assist for rolling, max x 2 to total assist for bed transfers and max x 2 for sit to stand with use of stedy. Patient's daughter instructed on UB exercises to perform with patient and Hep and theraband provided. Patient also encouraged to perform heel slides to improve LB strength. Patient reports he is discharging tomorrow. Cont POC.    Follow Up Recommendations  SNF;Home health OT;Supervision/Assistance - 24 hour    Equipment Recommendations  None recommended by OT    Recommendations for Other Services      Precautions / Restrictions Precautions Precautions: Fall Precaution Comments: Hx of orthostatic hypotension, Loose BMs Restrictions Weight Bearing Restrictions: No       Mobility Bed Mobility Overal bed mobility: Needs Assistance Bed Mobility: Rolling;Supine to Sit;Sit to Supine Rolling: Max assist Sidelying to sit: Max assist;+2 for physical assistance;+2 for safety/equipment;HOB elevated     Sit to sidelying: Total assist;+2 for physical assistance;+2 for safety/equipment General bed mobility comments: Performed rolling left and right  with pericare. Max assist x 2 to transfer to side of bed towards his left. Total assist to return to bed.  Transfers Overall transfer level: Needs assistance   Transfers: Sit to/from Stand Sit to Stand: Max assist;+2 physical assistance;+2 safety/equipment         General transfer comment: Max assist to stand x 3 with use of stedy. Patient reporting dizziness with sitting up. Patient limited by fatigue. tolerated upright sitting on stedy to reposition closer to [facility].    Balance Overall balance assessment: Needs assistance Sitting-balance support: No upper extremity supported;Feet supported Sitting balance-Leahy Scale: Poor Sitting balance - Comments: min assist to maintain upright position.   Standing balance support: During functional activity Standing balance-Leahy Scale: Zero                             ADL either performed or assessed with clinical judgement   ADL                               Toileting- Clothing Manipulation and Hygiene: Total assistance;Bed level Toileting - Clothing Manipulation Details (indicate cue type and reason): total assist for clean up after bowel movement in bed.             Vision       Perception     Praxis      Cognition Arousal/Alertness: Awake/alert Behavior During Therapy: WFL for tasks assessed/performed Overall Cognitive Status: Within Functional Limits for tasks assessed  Exercises Other Exercises Other Exercises: Demonstrated UB exercises, and patient performed reps with yellow theraband to perform in hospital bed including bicep curls, tricep extension, shoulder external rotation, shoulder flexion. Daughter present and recording instructions. handout provided as well. Other Exercises: In supine: Heel slides x 5 each leg   Shoulder Instructions       General Comments      Pertinent Vitals/ Pain       Pain Assessment: No/denies  pain  Home Living                                          Prior Functioning/Environment              Frequency  Min 2X/week        Progress Toward Goals  OT Goals(current goals can now be found in the care plan section)  Progress towards OT goals: Progressing toward goals  Acute Rehab OT Goals Patient Stated Goal: "whatever you want to work on" OT Goal Formulation: With patient Time For Goal Achievement: 07/23/20 Potential to Achieve Goals: [place] Discharge plan remains appropriate    Co-evaluation          OT goals addressed during session: Strengthening/ROM (functional mobility)      AM-PAC OT "6 Clicks" Daily Activity     Outcome Measure   Help from another person eating meals?: A Little Help from another person taking care of personal grooming?: A Little Help from another person toileting, which includes using toliet, bedpan, or urinal?: Total Help from another person bathing (including washing, rinsing, drying)?: A Lot Help from another person to put on and taking off regular upper body clothing?: A Lot Help from another person to put on and taking off regular lower body clothing?: Total 6 Click Score: 12    End of Session Equipment Utilized During Treatment: Rolling walker;Other (comment) (stedy)  OT Visit Diagnosis: Muscle weakness (generalized) (M62.81)   Activity Tolerance Patient limited by fatigue   Patient Left in bed;with call bell/phone within reach;with family/visitor present;with bed alarm set   Nurse Communication  (okay to see per rn)        Time: 3500-9381 OT Time Calculation (min): 41 min  Charges: OT General Charges $OT Visit: 1 Visit OT Treatments $Therapeutic Activity: 23-37 mins $Therapeutic Exercise: 8-22 mins  Adrian Singh, OTR/L [place]  Office [phone] Pager: [place] 07/14/2020, 4:11 PM

## 2020-07-14 NOTE — Progress Notes (Signed)

## 2020-07-14 NOTE — Progress Notes (Signed)
CELIA, SPEECH THERAPY AT BEDSIDE.

## 2020-07-14 NOTE — Progress Notes (Signed)
NP [facility] AND MD [facility] AT BEDSIDE EXPLAINING PLAN OF CARE TO PATIENT AND FUTURE PLANS FOR CARE MOVING FORWARD.

## 2020-07-14 NOTE — TOC Progression Note (Signed)
Transition of Care [facility]) - Progression Note    Patient Details  Name: QUAVIS KLUTZ MRN: [id number] Date of Birth: 1933-10-15  Transition of Care [facility]) CM/SW Contact  Purcell Mouton, RN Phone Number: 07/14/2020, 11:42 AM  Clinical Narrative:     Change in discharge plans. Pt will discharge in the AM.   Expected Discharge Plan: [place] Barriers to Discharge: Barriers Unresolved (comment) (a.fib and ielus)  Expected Discharge Plan and Services Expected Discharge Plan: [place]   Discharge Planning Services: CM Consult Post Acute Care Choice: [place] Living arrangements for the past 2 months: [place] Expected Discharge Date:  (unknown)                                     Social Determinants of Health (SDOH) Interventions    Readmission Risk Interventions Readmission Risk Prevention Plan 06/20/2020 06/11/2020  Transportation Screening Complete Complete  Medication Review Press photographer) Complete Complete  PCP or Specialist appointment within 3-5 days of discharge Complete -  [place] or Home Care Consult Complete -  SW Recovery Care/Counseling Consult Complete Complete  Palliative Care Screening Not Applicable Not Applicable  Skilled Nursing Facility Complete Complete  Some recent data might be hidden

## 2020-07-15 DIAGNOSIS — I2510 Atherosclerotic heart disease of native coronary artery without angina pectoris: Secondary | ICD-10-CM | POA: Diagnosis not present

## 2020-07-15 DIAGNOSIS — I10 Essential (primary) hypertension: Secondary | ICD-10-CM | POA: Diagnosis not present

## 2020-07-15 DIAGNOSIS — K567 Ileus, unspecified: Secondary | ICD-10-CM | POA: Diagnosis not present

## 2020-07-15 DIAGNOSIS — I4891 Unspecified atrial fibrillation: Secondary | ICD-10-CM | POA: Diagnosis not present

## 2020-07-15 DIAGNOSIS — I951 Orthostatic hypotension: Secondary | ICD-10-CM | POA: Diagnosis not present

## 2020-07-15 LAB — GLUCOSE, CAPILLARY
Glucose-Capillary: 146 mg/dL — ABNORMAL HIGH (ref 70–99)
Glucose-Capillary: 166 mg/dL — ABNORMAL HIGH (ref 70–99)
Glucose-Capillary: 166 mg/dL — ABNORMAL HIGH (ref 70–99)

## 2020-07-15 MED ORDER — METOPROLOL TARTRATE 50 MG PO TABS: 50.0000 mg | ORAL_TABLET | Freq: Three times a day (TID) | ORAL | 11 refills | Status: AC

## 2020-07-15 NOTE — Discharge Summary (Signed)
Physician Discharge Summary  Adrian Singh:[id number] DOB: [birth year] DOA: 07/04/2020  PCP: [name], MD  Admit date: 07/04/2020 Discharge date: 07/15/2020  Admitted From: Home Disposition: Home with home health  Recommendations for Outpatient Follow-up:  1. Follow up with PCP in 1-2 weeks 2. Please obtain BMP/CBC in one week 3. Follow-up with community palliative  Home Health: PT/OT/RN/home health aide Equipment/Devices: Available at home  Discharge Condition: Fair CODE STATUS: Partial code Diet recommendation: Dysphagia 2 diet, regular diet with supplements  Discharge summary:  86 year old gentleman with history of coronary artery disease, paroxysmal A. fib, type 2 diabetes on oral hypoglycemics, chronic anemia, history of stroke and residual dysarthria and left-sided weakness, metastatic prostate cancer and significant orthostatic hypotension.  He was recently admitted to the hospital multiple times with different issues including orthostatic hypotension.  Presented to the ER from nursing home with 2 days of increasing abdominal discomfort and distention and found to have ileus.  Hospital course complicated with failure to thrive, rapid A. Fib.  His ileus resolved with conservative management and now have normal bowel function.  #1 paroxysmal A. fib with RVR: Started with metoprolol, inadequate rate control.  Developed RVR on 10/26. Treated with amiodarone, in and out of the A. fib.  As per cardiology recommendation, discharged with metoprolol 50 mg 3 times a day and amiodarone 200 mg twice a day.  Given frailty and poor functional status, high risk for bleeding with anticoagulation.  On hold for now.  Also concern for intracranial neoplasm.  #2 HCAP: Initially treated as community-acquired pneumonia with ceftriaxone and doxycycline.  Suspected aspiration.  Cultures are negative.  Clinical improvement now.  Completed antibiotics.  Very high risk of aspiration and  decompensation.  #3 electrolyte abnormalities: Potassium and phosphate was replaced and are adequate.  #4 chronic systolic heart failure: Currently euvolemic.  #5 chronic anemia and thrombocytopenia: Anemia of chronic disease.  Received 1 unit of PRBC during this hospitalization.  #6 suspected intracranial neoplasm: Has a history of CVA with residual left-sided weakness and dysarthria.  Abnormal MRI brain on 8/21. Repeat MRI of the brain this admission showed 1.9 x 2.5 cm enhancing lesion within posterior right frontal lobe suspect for malignancy. Seen by neuro-oncology, recommended surveillance and they will follow.  #7 goals of care: Remains in overall poor clinical situation with progressive debility and multiple medical problems.   Family decided to take him home with maximizing home health care and therapies. Patient with progressive decline in health status.  He will benefit with palliative care follow-up at home.  Referral made.  Patient remains in overall poor clinical status with multiple medical issues and high risk of readmission, however currently medically stable to go home.  Detailed discussion with family, dysphagia precautions.  Discharge Diagnoses:  Principal Problem:   Ileus ([place]) Active Problems:   Essential hypertension   Uncontrolled type 2 diabetes mellitus with diabetic polyneuropathy, without long-term current use of insulin ([facility])   CKD stage 3 due to type 2 diabetes mellitus ([place])   History of CVA (cerebrovascular accident)   CAD S/P percutaneous coronary angioplasty   Prostate cancer ([place])   Weakness generalized   Elevated troponin   Pressure injury of skin   Severe protein-calorie malnutrition ([place])   Adult failure to thrive   Hypokalemia   Atrial fibrillation with RVR ([place])    Discharge Instructions  Discharge Instructions    Diet - low sodium heart healthy   Complete by: As directed    Dysphagia 2  diet , all time aspiration precautions    Discharge wound care:   Complete by: As directed    Barrier wound dressing   Increase activity slowly   Complete by: As directed      Allergies as of 07/15/2020      Reactions   Soma [carisoprodol] Other (See Comments)   "did a number on me"   Doxazosin Other (See Comments)   dizziness      Medication List    STOP taking these medications   rivaroxaban 20 MG Tabs tablet Commonly known as: XARELTO   sodium chloride 1 g tablet     TAKE these medications   acetaminophen 325 MG tablet Commonly known as: TYLENOL Take 650 mg by mouth every 12 (twelve) hours as needed for mild pain or moderate pain.   amiodarone 200 MG tablet Commonly known as: PACERONE Take 1 tablet (200 mg total) by mouth 2 (two) times daily.   atorvastatin 20 MG tablet Commonly known as: LIPITOR Take 20 mg by mouth at bedtime.   Bayer Microlet Lancets lancets Use to test blood sugar once daily dx: E11.40   Calcium 600 1500 (600 Ca) MG Tabs tablet Generic drug: calcium carbonate Take 600 mg of elemental calcium by mouth daily.   CONTOUR NEXT EZ MONITOR w/Device Kit USE TO TEST BLOOD SUGAR ONCE DAILY. Dx Code E11.40   Contour Next Test test strip Generic drug: glucose blood Check blood sugar once daily and as directed.Dx Code E11.40   cyanocobalamin 1000 MCG tablet Take 1 tablet (1,000 mcg total) by mouth at bedtime. What changed: how much to take   docusate sodium 100 MG capsule Commonly known as: COLACE Take 200 mg by mouth 2 (two) times daily as needed for mild constipation.   enzalutamide 80 MG tablet Commonly known as: XTANDI Take 80 mg by mouth daily.   feeding supplement (PRO-STAT SUGAR FREE 64) Liqd Take 30 mLs by mouth in the morning and at bedtime.   furosemide 40 MG tablet Commonly known as: LASIX Take 40 mg by mouth daily as needed.   glimepiride 2 MG tablet Commonly known as: Amaryl Take 1 tablet (2 mg total) by mouth every morning. What changed: when to take this    metoprolol tartrate 50 MG tablet Commonly known as: Lopressor Take 1 tablet (50 mg total) by mouth in the morning, at noon, and at bedtime. What changed:   medication strength  how much to take  when to take this   multivitamin with minerals Tabs tablet Take 1 tablet by mouth daily.   nitroGLYCERIN 0.4 MG SL tablet Commonly known as: NITROSTAT Place 1 tablet (0.4 mg total) under the tongue every 5 (five) minutes as needed for chest pain. What changed: additional instructions   polyethylene glycol 17 g packet Commonly known as: MIRALAX / GLYCOLAX Take 17 g by mouth daily.   traMADol 50 MG tablet Commonly known as: ULTRAM Take 1 tablet (50 mg total) by mouth 3 (three) times daily as needed. What changed: reasons to take this   VITAMIN B COMPLEX PO Take 1 tablet by mouth daily.   vitamin C 1000 MG tablet Take 1,000 mg by mouth daily.   Vitamin D3 1.25 MG (50000 UT) Caps Take 50,000 Units by mouth once a week. Saturday            Discharge Care Instructions  (From admission, onward)         Start     Ordered   07/15/20 0000  Discharge wound care:       Comments: Barrier wound dressing   07/15/20 0930          Follow-up Information    Deberah Pelton, NP. Go on 08/03/2020.   Specialty: Cardiology Why: '@2' :15pm for hospital follow up with Dr. Doug Sou PA.  Contact information: [address] [place] [address] [phone]              Allergies  Allergen Reactions  . Soma [Carisoprodol] Other (See Comments)    "did a number on me"  . Doxazosin Other (See Comments)    dizziness    Consultations:  Cardiology  Palliative care   Procedures/Studies: DG Abd 1 View  Result Date: 07/06/2020 CLINICAL DATA:  Abdominal pain EXAM: ABDOMEN - 1 VIEW COMPARISON:  CT abdomen and pelvis 07/05/2020 FINDINGS: Diffuse gaseous distention of colon increased from previous exam. This includes extensive gaseous distension of the sigmoid  loop. No definite gas in rectum or bowel wall thickening identified. Increased stool in RIGHT colon. Small bowel decompressed. Osseous demineralization with degenerative disc/facet disease changes and dextroconvex scoliosis lumbar spine. No urinary tract calcification definitely visualized. Scattered pelvic phleboliths. IMPRESSION: Gaseous distention of colon increased from previous exam, especially sigmoid loop. No evidence of sigmoid volvulus was identified on the prior CT. Findings favor diffuse colonic ileus; consider repeat CT if patient's symptoms persist or worsen. Electronically Signed   By: Lavonia Dana M.D.   On: 07/06/2020 10:36   CT Head Wo Contrast  Result Date: 06/18/2020 CLINICAL DATA:  Weakness EXAM: CT HEAD WITHOUT CONTRAST TECHNIQUE: Contiguous axial images were obtained from the base of the skull through the vertex without intravenous contrast. COMPARISON:  April 30, 2020 FINDINGS: Brain: No evidence of acute territorial infarction, hemorrhage, hydrocephalus,extra-axial collection or mass lesion/mass effect. There is dilatation the ventricles and sulci consistent with age-related atrophy. Low-attenuation changes in the deep white matter consistent with small vessel ischemia. Vascular: No hyperdense vessel or unexpected calcification. Skull: The skull is intact. No fracture or focal lesion identified. Sinuses/Orbits: The visualized paranasal sinuses and mastoid air cells are clear. The orbits and globes intact. Other: None IMPRESSION: No acute intracranial abnormality. Findings consistent with age related atrophy and chronic small vessel ischemia Electronically Signed   By: Prudencio Pair M.D.   On: 06/18/2020 23:05   MR BRAIN W WO CONTRAST  Result Date: 07/12/2020 CLINICAL DATA:  Stroke, follow-up. EXAM: MRI HEAD WITHOUT AND WITH CONTRAST TECHNIQUE: Multiplanar, multiecho pulse sequences of the brain and surrounding structures were obtained without and with intravenous contrast. CONTRAST:   4m GADAVIST GADOBUTROL 1 MMOL/ML IV SOLN COMPARISON:  Head CT 06/18/2020, brain MRI with contrast 05/01/2020. Brain MRI without contrast 05/01/2020. Head CT 04/30/2020. FINDINGS: Brain: Moderate generalized cerebral atrophy. Again demonstrated is a circumscribed focus of T2/FLAIR hyperintense signal abnormality within the posterior right frontal lobe white matter and extending to involve portions of the callosal body on the right. As before, there is associated mass effect with partial effacement of the right lateral ventricle. The lesion is similar to slightly increased in size as compared to the prior MRI of 814 2021, measuring 1.9 x 1.7 x 2.5 cm (AP x TV x CC) on the current exam (previously 1.8 x 1.6 x 2.3) (remeasured on prior). Internal regions of restricted diffusion are more conspicuous as compared to the prior study (series 6, image 38). Additionally, the lesion demonstrates progression of patchy and irregular enhancement. There is a punctate focus of restricted diffusion immediately  posterior to the lesion, which may reflect an acute infarct or satellite focus of tumor (series 5, image 90). Redemonstrated small scattered chronic cortical infarcts within the right frontal and parietal lobes, as well as right temporal occipital lobe. Redemonstrated tiny chronic infarct within the left cerebellar hemisphere. No chronic intracranial blood products. No extra-axial fluid collection. No midline shift. Vascular: Expected proximal arterial flow voids. Skull and upper cervical spine: New from the prior MRI of 04/30/2020, there is extensive abnormal T1 hypointense marrow signal within the visualized upper cervical spine. Sinuses/Orbits: Visualized orbits show no acute finding. Mild scattered paranasal sinus mucosal thickening. Trace fluid within bilateral mastoid air cells. These results will be called to the ordering clinician or representative by the Radiologist Assistant, and communication documented in the PACS  or [employer]. IMPRESSION: 1.9 x 1.7 x 2.5 cm enhancing lesion within the posterior right frontal lobe white matter with extension into the adjacent callosal body. This lesion is similar to slightly increased in size as compared to the prior brain MRI of 04/30/2020 and demonstrates persistent mass effect with partial effacement of the right lateral ventricle. Additionally, there has been an interval progression of associated irregular enhancement and restricted diffusion. These findings raise strong suspicion for an intracranial neoplasm (i.e. glioma). Neuro-Oncology consultation is recommended. There is a punctate focus of restricted diffusion immediately posterior to this lesion within the posterior right frontal lobe, which may reflect an acute infarct or possibly a small satellite tumor nodule. Moderate generalized cerebral atrophy. Redemonstrated small chronic cortically based infarcts within the right frontal, parietal and temporal occipital lobes. New from the prior examination, there is extensive abnormal T1 hypointense marrow signal throughout the visualized upper cervical spine. Primary differential considerations include red marrow reconversion, osseous metastatic disease or a marrow infiltrative process. Electronically Signed   By: Kellie Simmering DO   On: 07/12/2020 12:40   CT ABDOMEN PELVIS W CONTRAST  Result Date: 07/05/2020 CLINICAL DATA:  Suspected bowel obstruction. No bowel movements in 3 days. EXAM: CT ABDOMEN AND PELVIS WITH CONTRAST TECHNIQUE: Multidetector CT imaging of the abdomen and pelvis was performed using the standard protocol following bolus administration of intravenous contrast. CONTRAST:  161m OMNIPAQUE IOHEXOL 300 MG/ML  SOLN COMPARISON:  05/01/2020 FINDINGS: Lower chest: Mild interstitial pattern to the lung bases likely representing fibrosis. Minimal bilateral pleural effusions. Moderate esophageal hiatal hernia. Coronary artery calcifications. Hepatobiliary: No focal  liver abnormality is seen. No gallstones, gallbladder wall thickening, or biliary dilatation. Pancreas: Pancreas is atrophic. Low-attenuation lesion in the pancreatic head appears stable. Calcification in the pancreatic head likely indicates chronic pancreatitis. Spleen: Normal in size without focal abnormality. Adrenals/Urinary Tract: Adrenal glands are unremarkable. Kidneys are normal, without renal calculi, focal lesion, or hydronephrosis. Bladder is unremarkable. Stomach/Bowel: Stomach and small bowel are decompressed. Moderately distended gas and fluid-filled colon and rectum. Changes likely to represent ileus. Infectious process would also be possible. No colonic wall thickening to suggest colitis. Appendix is not identified. Vascular/Lymphatic: Aortic atherosclerosis. No enlarged abdominal or pelvic lymph nodes. Reproductive: Prostate is unremarkable. Other: No free air or free fluid in the abdomen. Abdominal wall musculature appears intact. Musculoskeletal: Prominent degenerative changes throughout the spine. Multiple focal and confluent areas of sclerosis in the visualized skeleton consistent with diffuse bone metastasis. IMPRESSION: 1. Moderately distended gas and fluid-filled colon and rectum likely to represent ileus. Infectious process would also be possible. No evidence of small bowel obstruction. 2. Moderate esophageal hiatal hernia. 3. Minimal bilateral pleural effusions. 4. Diffuse bone metastasis. 5.  Stable appearance of low-attenuation lesion in the pancreatic head. 6. Aortic atherosclerosis. 7. Changes of chronic pancreatitis. Aortic Atherosclerosis (ICD10-I70.0). Electronically Signed   By: Lucienne Capers M.D.   On: 07/05/2020 01:22   DG CHEST PORT 1 VIEW  Result Date: 07/09/2020 CLINICAL DATA:  Shortness of breath EXAM: PORTABLE CHEST 1 VIEW COMPARISON:  Yesterday FINDINGS: Borderline heart size accentuated by rotation and a moderate hiatal hernia. Indistinct opacity at the bases,  greater on the left. No Kerley lines, effusion, or pneumothorax. IMPRESSION: Decreased lung volumes with increased presumed atelectasis. Electronically Signed   By: Monte Fantasia M.D.   On: 07/09/2020 06:56   DG CHEST PORT 1 VIEW  Result Date: 07/08/2020 CLINICAL DATA:  [place]. EXAM: PORTABLE CHEST 1 VIEW COMPARISON:  07/07/2020.  CT 07/05/2020.  Multiple older films. FINDINGS: Heart size is normal. Right lung is clear. Infiltrate/volume loss at the left lung base consistent with pneumonia. No dense consolidation, lobar collapse or visible effusion by radiography. Small amount of pleural fluid visible on CT of 3 days ago. Moderate size hiatal hernia as seen previously. No acute bone finding. IMPRESSION: Persistent mild infiltrate/volume loss at the left lung base consistent with pneumonia. Electronically Signed   By: Nelson Chimes M.D.   On: 07/08/2020 08:30   DG CHEST PORT 1 VIEW  Result Date: 07/07/2020 CLINICAL DATA:  Abdominal pain. EXAM: PORTABLE CHEST 1 VIEW COMPARISON:  July 06, 2020. FINDINGS: The heart size and mediastinal contours are within normal limits. Both lungs are clear. Stable elevated left hemidiaphragm is noted. No pneumothorax or pleural effusion is noted. The visualized skeletal structures are unremarkable. IMPRESSION: No active disease. Electronically Signed   By: Marijo Conception M.D.   On: 07/07/2020 19:32   DG CHEST PORT 1 VIEW  Result Date: 07/06/2020 CLINICAL DATA:  Chest pain. EXAM: PORTABLE CHEST 1 VIEW COMPARISON:  July 04, 2020 FINDINGS: Low lung volumes are noted. There is no evidence of acute infiltrate, pleural effusion or pneumothorax. The heart size and mediastinal contours are within normal limits. The visualized skeletal structures are unremarkable. IMPRESSION: No active disease. Electronically Signed   By: Virgina Norfolk M.D.   On: 07/06/2020 20:10   DG Chest Port 1 View  Result Date: 07/04/2020 CLINICAL DATA:  Shortness of breath EXAM: PORTABLE  CHEST 1 VIEW COMPARISON:  06/18/2020 FINDINGS: The heart size and mediastinal contours are within normal limits. Both lungs are clear. The visualized skeletal structures are unremarkable. IMPRESSION: No active disease. Electronically Signed   By: Inez Catalina M.D.   On: 07/04/2020 23:15   DG Chest Portable 1 View  Result Date: 06/18/2020 CLINICAL DATA:  Weakness. EXAM: PORTABLE CHEST 1 VIEW COMPARISON:  June 09, 2020 FINDINGS: Low lung volumes are seen which is likely secondary to the degree of patient inspiration. The heart size and mediastinal contours are within normal limits. Both lungs are clear. The visualized skeletal structures are unremarkable. IMPRESSION: No active cardiopulmonary disease. Electronically Signed   By: Virgina Norfolk M.D.   On: 06/18/2020 20:05   DG Abd Portable 1V  Result Date: 07/07/2020 CLINICAL DATA:  Diffuse abdominal pain, abdominal distension EXAM: PORTABLE ABDOMEN - 1 VIEW COMPARISON:  07/06/2020 FINDINGS: The colon rectum appears diffusely gas-filled with the cecum dilated to approximately 10.1 cm, similar to that noted on prior examination. The findings are suggestive of a colonic ileus. Small amount of stool noted within the cecum. No gross free intraperitoneal gas. Foley catheter balloon overlies the mid pelvis. IMPRESSION: Stable diffuse gaseous  distension of the colon suggestive of a colonic ileus. Electronically Signed   By: Fidela Salisbury MD   On: 07/07/2020 09:45   ECHOCARDIOGRAM COMPLETE  Result Date: 07/08/2020    ECHOCARDIOGRAM REPORT   Patient Name:   Muscab Cherylynn Ridges Date of Exam: 07/07/2020 Medical Rec #:  [id number]      Height:       71.0 in Accession #:    [id number]     Weight:       151.0 lb Date of Birth:  July 20, 1934     BSA:          1.871 m Patient Age:    89 years       BP:           149/79 mmHg Patient Gender: M              HR:           71 bpm. Exam Location:  Inpatient Procedure: 2D Echo, Cardiac Doppler and Color Doppler Indications:     Elevated Troponin  History:        Patient has prior history of Echocardiogram examinations, most                 recent 05/01/2020. CAD, Stroke, Arrythmias:Atrial Fibrillation;                 Risk Factors:Hypertension, Diabetes and GERD. Cancer.  Sonographer:    Jonelle Sidle Dance Referring Phys: [phone] A CALDWELL POWELL JR  Sonographer Comments: Suboptimal subcostal window. IMPRESSIONS  1. Since the last study on 05/01/2020 LVEF has decreased 50-55% to 40-45% with diffuse hypokinesis and akinesis in the basal and mid inferior and inferolateral walls.  2. Left ventricular ejection fraction, by estimation, is 40 to 45%. The left ventricle has mildly decreased function. The left ventricle demonstrates regional wall motion abnormalities (see scoring diagram/findings for description). There is mild concentric left ventricular hypertrophy. Left ventricular diastolic parameters are consistent with Grade I diastolic dysfunction (impaired relaxation).  3. Right ventricular systolic function is normal. The right ventricular size is normal. There is normal pulmonary artery systolic pressure.  4. Left atrial size was mildly dilated.  5. The mitral valve is normal in structure. Mild mitral valve regurgitation. No evidence of mitral stenosis.  6. The aortic valve is normal in structure. Aortic valve regurgitation is not visualized. No aortic stenosis is present.  7. The inferior vena cava is normal in size with greater than 50% respiratory variability, suggesting right atrial pressure of 3 mmHg. FINDINGS  Left Ventricle: Left ventricular ejection fraction, by estimation, is 40 to 45%. The left ventricle has mildly decreased function. The left ventricle demonstrates regional wall motion abnormalities. The left ventricular internal cavity size was normal in size. There is mild concentric left ventricular hypertrophy. Left ventricular diastolic parameters are consistent with Grade I diastolic dysfunction (impaired relaxation). Normal  left ventricular filling pressure.  LV Wall Scoring: The inferior wall, posterior wall, and basal inferoseptal segment are akinetic. Right Ventricle: The right ventricular size is normal. No increase in right ventricular wall thickness. Right ventricular systolic function is normal. There is normal pulmonary artery systolic pressure. The tricuspid regurgitant velocity is 2.33 m/s, and  with an assumed right atrial pressure of 8 mmHg, the estimated right ventricular systolic pressure is 02.6 mmHg. Left Atrium: Left atrial size was mildly dilated. Right Atrium: Right atrial size was normal in size. Pericardium: There is no evidence of pericardial effusion. Mitral Valve: The mitral valve is  normal in structure. Mild mitral valve regurgitation. No evidence of mitral valve stenosis. Tricuspid Valve: The tricuspid valve is normal in structure. Tricuspid valve regurgitation is not demonstrated. No evidence of tricuspid stenosis. Aortic Valve: The aortic valve is normal in structure. Aortic valve regurgitation is not visualized. No aortic stenosis is present. Pulmonic Valve: The pulmonic valve was normal in structure. Pulmonic valve regurgitation is not visualized. No evidence of pulmonic stenosis. Aorta: The aortic root is normal in size and structure. Venous: The inferior vena cava is normal in size with greater than 50% respiratory variability, suggesting right atrial pressure of 3 mmHg. IAS/Shunts: No atrial level shunt detected by color flow Doppler.  LEFT VENTRICLE PLAX 2D LVIDd:         4.70 cm LVIDs:         2.90 cm LV PW:         1.20 cm LV IVS:        1.10 cm LVOT diam:     2.20 cm LV SV:         70 LV SV Index:   37 LVOT Area:     3.80 cm  RIGHT VENTRICLE RV Basal diam:  1.90 cm TAPSE (M-mode): 1.9 cm LEFT ATRIUM             Index       RIGHT ATRIUM           Index LA diam:        3.90 cm 2.08 cm/m  RA Area:     11.90 cm LA Vol (A2C):   82.1 ml 43.87 ml/m RA Volume:   24.00 ml  12.83 ml/m LA Vol (A4C):    57.7 ml 30.84 ml/m LA Biplane Vol: 69.9 ml 37.35 ml/m  AORTIC VALVE LVOT Vmax:   100.25 cm/s LVOT Vmean:  61.700 cm/s LVOT VTI:    0.184 m  AORTA Ao Root diam: 3.60 cm Ao Asc diam:  3.10 cm MITRAL VALVE               TRICUSPID VALVE MV Area (PHT): 2.32 cm    TR Peak grad:   21.7 mmHg MV Decel Time: 327 msec    TR Vmax:        233.00 cm/s MV E velocity: 61.60 cm/s MV A velocity: 81.90 cm/s  SHUNTS MV E/A ratio:  0.75        Systemic VTI:  0.18 m                            Systemic Diam: 2.20 cm Ena Dawley MD Electronically signed by Ena Dawley MD Signature Date/Time: 07/08/2020/9:03:41 AM    Final    (Echo, Carotid, EGD, Colonoscopy, ERCP)    Subjective: Patient seen and examined.  No overnight events.  Heart rate is fluctuating, in and out of the A. fib.  Less 130.  Patient himself denies any chest pain or palpitation. No nausea vomiting.  Had bowel movement today morning.  Denies any abdominal distention. He is excited to go home.   Discharge Exam: Vitals:   07/14/20 2123 07/15/20 0449  BP: 119/67 111/80  Pulse: (!) 108 79  Resp: 16 12  Temp: 98.1 F (36.7 C) 97.8 F (36.6 C)  SpO2:  98%   Vitals:   07/14/20 1800 07/14/20 1841 07/14/20 2123 07/15/20 0449  BP:   119/67 111/80  Pulse:   (!) 108 79  Resp: '20 16 16 ' 12  Temp:   98.1 F (36.7 C) 97.8 F (36.6 C)  TempSrc:   Oral Oral  SpO2:    98%  Weight:      Height:        General: Pt is alert, awake, not in acute distress Chronically sick looking, frail and cachectic. Cardiovascular: RRR, tachycardic. Respiratory: CTA bilaterally, no wheezing, no rhonchi Abdominal: Soft, NT, ND, bowel sounds + Extremities: no edema, no cyanosis    The results of significant diagnostics from this hospitalization (including imaging, microbiology, ancillary and laboratory) are listed below for reference.     Microbiology: Recent Results (from the past 240 hour(s))  MRSA PCR Screening     Status: None   Collection Time:  07/08/20  3:00 PM   Specimen: Nasal Mucosa; Nasopharyngeal  Result Value Ref Range Status   MRSA by PCR NEGATIVE NEGATIVE Final    Comment:        The GeneXpert MRSA Assay (FDA approved for NASAL specimens only), is one component of a comprehensive MRSA colonization surveillance program. It is not intended to diagnose MRSA infection nor to guide or monitor treatment for MRSA infections. Performed at [facility], [place] [address] [place], [place] [address]      Labs: BNP (last 3 results) No results for input(s): BNP in the last 8760 hours. Basic Metabolic Panel: Recent Labs  Lab 07/09/20 0508 07/10/20 0527 07/11/20 0442 07/12/20 0429 07/13/20 0501  NA 144 143 140 142 139  K 3.0* 3.4* 4.5 4.1 4.1  CL 111 108 109 110 107  CO2 '24 24 22 ' 21* 24  GLUCOSE 85 148* 166* 111* 140*  BUN '15 20 22 23 ' 26*  CREATININE 0.63 0.68 0.70 0.72 0.81  CALCIUM 7.5* 7.6* 7.8* 7.9* 7.7*  MG 1.9 2.0 1.9 1.9 2.0  PHOS 2.0* 2.3* 2.5 3.0 3.3   Liver Function Tests: Recent Labs  Lab 07/09/20 0508 07/10/20 0527 07/11/20 0442 07/12/20 0429 07/13/20 0501  AST 58* 37 34 36 43*  ALT '16 16 16 20 21  ' ALKPHOS 745* 559* 494* 457* 492*  BILITOT 0.6 0.9 0.7 0.5 0.5  PROT 6.4* 6.0* 6.1* 6.0* 6.2*  ALBUMIN 1.9* 1.8* 1.8* 1.7* 1.8*   No results for input(s): LIPASE, AMYLASE in the last 168 hours. No results for input(s): AMMONIA in the last 168 hours. CBC: Recent Labs  Lab 07/09/20 0508 07/10/20 0527 07/11/20 0442 07/12/20 0429 07/13/20 0501  WBC 5.9 5.4 7.3 7.5 10.6*  NEUTROABS 4.1 3.4 5.3 5.1 7.1  HGB 8.5* 8.5* 9.2* 8.8* 9.4*  HCT 27.2* 28.1* 31.5* 30.3* 31.7*  MCV 80.0 82.4 84.5 83.7 84.5  PLT 131* 123* 104* 110* 97*   Cardiac Enzymes: No results for input(s): CKTOTAL, CKMB, CKMBINDEX, TROPONINI in the last 168 hours. BNP: Invalid input(s): POCBNP CBG: Recent Labs  Lab 07/14/20 1615 07/14/20 2008 07/15/20 0002 07/15/20 0406 07/15/20 0749  GLUCAP 168* 152*  166* 146* 166*   D-Dimer No results for input(s): DDIMER in the last 72 hours. Hgb A1c No results for input(s): HGBA1C in the last 72 hours. Lipid Profile No results for input(s): CHOL, HDL, LDLCALC, TRIG, CHOLHDL, LDLDIRECT in the last 72 hours. Thyroid function studies No results for input(s): TSH, T4TOTAL, T3FREE, THYROIDAB in the last 72 hours.  Invalid input(s): FREET3 Anemia work up No results for input(s): VITAMINB12, FOLATE, FERRITIN, TIBC, IRON, RETICCTPCT in the last 72 hours. Urinalysis    Component Value Date/Time   COLORURINE YELLOW 07/07/2020 1811   APPEARANCEUR HAZY (A) 07/07/2020  [place] 1.017 07/07/2020 1811   PHURINE 5.0 07/07/2020 1811   GLUCOSEU 50 (A) 07/07/2020 1811   HGBUR LARGE (A) 07/07/2020 1811   BILIRUBINUR NEGATIVE 07/07/2020 1811   BILIRUBINUR negative 03/18/2014 1014   KETONESUR 20 (A) 07/07/2020 1811   PROTEINUR 100 (A) 07/07/2020 1811   UROBILINOGEN 1.0 03/31/2014 1228   NITRITE NEGATIVE 07/07/2020 1811   LEUKOCYTESUR TRACE (A) 07/07/2020 1811   Sepsis Labs Invalid input(s): PROCALCITONIN,  WBC,  LACTICIDVEN Microbiology Recent Results (from the past 240 hour(s))  MRSA PCR Screening     Status: None   Collection Time: 07/08/20  3:00 PM   Specimen: Nasal Mucosa; Nasopharyngeal  Result Value Ref Range Status   MRSA by PCR NEGATIVE NEGATIVE Final    Comment:        The GeneXpert MRSA Assay (FDA approved for NASAL specimens only), is one component of a comprehensive MRSA colonization surveillance program. It is not intended to diagnose MRSA infection nor to guide or monitor treatment for MRSA infections. Performed at [facility], [place] [address] [place], [place] [address]      Time coordinating discharge:  45 minutes  SIGNED:   Barb Merino, MD  [facility] 07/15/2020, 10:54 AM

## 2020-07-15 NOTE — Progress Notes (Signed)
Pt discharged to home, instructions reviewed with daughter and patient. Both acknowledged understanding. Basic supplies given to help start care at home. SRP, RN

## 2020-07-15 NOTE — Progress Notes (Signed)
Progress Note  Patient Name: Adrian Singh Date of Encounter: 07/15/2020  [facility] Cardiologist: Peter Martinique, MD   Subjective   Denies any chest pain or SOB.  Orthostatics ordered but not done yesterday.   Inpatient Medications    Scheduled Meds: . amiodarone  200 mg Oral BID  . atorvastatin  20 mg Oral Daily  . Chlorhexidine Gluconate Cloth  6 each Topical Daily  . diclofenac Sodium  2 g Topical QID  . feeding supplement  1 Container Oral Q24H  . feeding supplement  237 mL Oral BID BM  . insulin aspart  0-9 Units Subcutaneous Q4H  . lidocaine  1 patch Transdermal Q24H  . metoprolol tartrate  75 mg Oral BID  . nystatin  5 mL Mouth/Throat QID  . pantoprazole  40 mg Oral Daily  . traMADol  50 mg Oral Q6H   Continuous Infusions:  PRN Meds: acetaminophen **OR** acetaminophen, benzonatate, hydrALAZINE, metoprolol tartrate, morphine injection, nitroGLYCERIN, ondansetron (ZOFRAN) IV, phenol   Vital Signs    Vitals:   07/14/20 1800 07/14/20 1841 07/14/20 2123 07/15/20 0449  BP:   119/67 111/80  Pulse:   (!) 108 79  Resp: 20 16 16 12   Temp:   98.1 F (36.7 C) 97.8 F (36.6 C)  TempSrc:   Oral Oral  SpO2:    98%  Weight:      Height:        Intake/Output Summary (Last 24 hours) at 07/15/2020 0934 Last data filed at 07/15/2020 0703 Gross per 24 hour  Intake -  Output 475 ml  Net -475 ml   Last 3 Weights 07/14/2020 07/13/2020 07/12/2020  Weight (lbs) 156 lb 1.4 oz 152 lb 1.9 oz 151 lb 14.4 oz  Weight (kg) 70.8 kg 69 kg 68.9 kg      Telemetry    Atrial fibrillation with borderline HR control  - Personally Reviewed  ECG    NO new EKG to review  Physical Exam   GEN: frail and ill appearing in NAD HEENT: Normal NECK: No JVD; No carotid bruits LYMPHATICS: No lymphadenopathy CARDIAC:irregularly irregular, no murmurs, rubs, gallops RESPIRATORY:  Clear to auscultation without rales, wheezing or rhonchi  ABDOMEN: Soft, non-tender, non-distended  MUSCULOSKELETAL:  No edema; No deformity  SKIN: Warm and dry NEUROLOGIC:  Alert and oriented x 3 PSYCHIATRIC:  Normal affect    Labs    High Sensitivity Troponin:   Recent Labs  Lab 07/05/20 0056 07/05/20 0402 07/06/20 1248 07/06/20 1523 07/06/20 1717  TROPONINIHS 112* 107* 91* 81* 86*      Chemistry Recent Labs  Lab 07/11/20 0442 07/12/20 0429 07/13/20 0501  NA 140 142 139  K 4.5 4.1 4.1  CL 109 110 107  CO2 22 21* 24  GLUCOSE 166* 111* 140*  BUN 22 23 26*  CREATININE 0.70 0.72 0.81  CALCIUM 7.8* 7.9* 7.7*  PROT 6.1* 6.0* 6.2*  ALBUMIN 1.8* 1.7* 1.8*  AST 34 36 43*  ALT 16 20 21   ALKPHOS 494* 457* 492*  BILITOT 0.7 0.5 0.5  GFRNONAA >60 >60 >60  ANIONGAP 9 11 8      Hematology Recent Labs  Lab 07/11/20 0442 07/12/20 0429 07/13/20 0501  WBC 7.3 7.5 10.6*  RBC 3.73* 3.62* 3.75*  HGB 9.2* 8.8* 9.4*  HCT 31.5* 30.3* 31.7*  MCV 84.5 83.7 84.5  MCH 24.7* 24.3* 25.1*  MCHC 29.2* 29.0* 29.7*  RDW 22.0* 21.6* 21.8*  PLT 104* 110* 97*    Radiology  No results found.  Cardiac Studies   Echo 07/07/2020 1. Since the last study on 05/01/2020 LVEF has decreased 50-55% to 40-45%  with diffuse hypokinesis and akinesis in the basal and mid inferior and  inferolateral walls.  2. Left ventricular ejection fraction, by estimation, is 40 to 45%. The  left ventricle has mildly decreased function. The left ventricle  demonstrates regional wall motion abnormalities (see scoring  diagram/findings for description). There is mild  concentric left ventricular hypertrophy. Left ventricular diastolic  parameters are consistent with Grade I diastolic dysfunction (impaired  relaxation).  3. Right ventricular systolic function is normal. The right ventricular  size is normal. There is normal pulmonary artery systolic pressure.  4. Left atrial size was mildly dilated.  5. The mitral valve is normal in structure. Mild mitral valve  regurgitation. No evidence of  mitral stenosis.  6. The aortic valve is normal in structure. Aortic valve regurgitation is  not visualized. No aortic stenosis is present.  7. The inferior vena cava is normal in size with greater than 50%  respiratory variability, suggesting right atrial pressure of 3 mmHg.   Patient Profile     Adrian Singh is a 86 y.o. male with a hx of CAD s/p PCI/DES to LCx in 2017, paroxysmal atrial fibrillation, HTN c/b orthostatic hypotension, HLD, DM type 2, CVA, metastatic prostate cancer who is being seen today for the evaluation of atrial fibrillation with RVR.   His last ischemic evaluation was a [facility] 02/2016 which showed patent LCx stent with non-obstructive mild RCA and LAD disease.  He was admitted 04/2020 with an acute CVA c/b HCAP. Multiple admission afterwards for orthostatic hypotension.   The patient was admitted 07/04/2020 for ileus. Echo this admission showed EF 40-45%, RWMA with inferior/posterior/basal inferoseptal wall akinesis, G1DD, mild concentric LVH, mild LAE, and mild MR. Seen by Dr. Sallyanne Kuster 07/08/20 for abnormal echo felt that no acute wall motion changes. Last seen by Dr. Harrell Gave 07/09/2020 who agreed with Dr. Lurline Del recommendations and signed off. Per note "with anemia, overall poor functional status, flat hsTni, and improvement in his pain post decompression, no indication for ischemic evaluation at this time" . No anticoagulation recommended.   Cardiology is asked to see again for afib RVR.   Assessment & Plan    1. Atrial fibrillation with RVR - Intermittent afib this admission but now persistent - Due to persistent elevated HR despite being on cardizem drip he was given Digoxin 0.25mg  x 2, started Amiodarone 200mg  BID and increased lopressor >> converted to sinus but then reverted back to afib -CHA2DS2/VAS Stroke Risk Points=7however stopped heparin and plan not to start outpatient anticoagulation indefinitely given acute illness, anemia requiring transfusion  and concern for intracranial neoplasm based on prior cardiology note this admission.  -continue Amio 200mg  BID>>HR should improve while loading Amio -currently on Lopressor 75mg  BID but going to change to 50mg  TID to try to keep HR better controlled  2. CAD/ Abnormal echo - Had chest pain early this admission with echo as summarized above - No recurrent chest pain  - Continue statin and BB  3. HTN with hx of orthostatic hypotension - Avoid aggressive antihypertensive - Now on compression hose and abdominal binder  [facility] will sign off.   Medication Recommendations:  Lopressor 50mg  TID, Amio 200mg  BID Other recommendations (labs, testing, etc):  none Follow up as an outpatient:  TOC followup in 7-10 days   For questions or updates, please contact [facility] Please consult [email or web address]  for contact info under        Signed, Fransico Him, MD  07/15/2020, 9:34 AM

## 2020-07-15 NOTE — Progress Notes (Signed)
EMS arrive to transport pt to home. SRP, RN

## 2020-07-15 NOTE — TOC Progression Note (Signed)
Transition of Care [facility]) - Progression Note    Patient Details  Name: Adrian Singh MRN: [id number] Date of Birth: 11/22/1933  Transition [facility] CM/SW Contact  Purcell Mouton, RN Phone Number: 07/15/2020, 9:56 AM  Clinical Narrative:    PTAR called for 11 am pick-up.    Expected Discharge Plan: [place] Barriers to Discharge: Barriers Unresolved (comment) (a.fib and ielus)  Expected Discharge Plan and Services Expected Discharge Plan: [place]   Discharge Planning Services: CM Consult Post Acute Care Choice: [place] Living arrangements for the past 2 months: [place] Expected Discharge Date: 07/15/20                                     Social Determinants of Health (SDOH) Interventions    Readmission Risk Interventions Readmission Risk Prevention Plan 06/20/2020 06/11/2020  Transportation Screening Complete Complete  Medication Review Press photographer) Complete Complete  PCP or Specialist appointment within 3-5 days of discharge Complete -  [place] or Home Care Consult Complete -  SW Recovery Care/Counseling Consult Complete Complete  Palliative Care Screening Not Applicable Not Applicable  Skilled Nursing Facility Complete Complete  Some recent data might be hidden

## 2020-07-17 ENCOUNTER — Inpatient Hospital Stay (HOSPITAL_COMMUNITY)
Admission: EM | Admit: 2020-07-17 | Discharge: 2020-07-27 | DRG: 640 | Disposition: A | Discharge status: Skilled Nursing Facility | Payer: Medicare Other | Attending: Internal Medicine | Admitting: Internal Medicine

## 2020-07-17 ENCOUNTER — Encounter (HOSPITAL_COMMUNITY): Payer: Self-pay

## 2020-07-17 ENCOUNTER — Telehealth: Payer: Self-pay

## 2020-07-17 ENCOUNTER — Other Ambulatory Visit: Payer: Self-pay

## 2020-07-17 ENCOUNTER — Emergency Department (HOSPITAL_COMMUNITY): Payer: Medicare Other

## 2020-07-17 DIAGNOSIS — Z8249 Family history of ischemic heart disease and other diseases of the circulatory system: Secondary | ICD-10-CM

## 2020-07-17 DIAGNOSIS — Z85828 Personal history of other malignant neoplasm of skin: Secondary | ICD-10-CM

## 2020-07-17 DIAGNOSIS — M549 Dorsalgia, unspecified: Secondary | ICD-10-CM

## 2020-07-17 DIAGNOSIS — I5022 Chronic systolic (congestive) heart failure: Secondary | ICD-10-CM | POA: Insufficient documentation

## 2020-07-17 DIAGNOSIS — E860 Dehydration: Secondary | ICD-10-CM | POA: Diagnosis present

## 2020-07-17 DIAGNOSIS — Z515 Encounter for palliative care: Secondary | ICD-10-CM | POA: Diagnosis not present

## 2020-07-17 DIAGNOSIS — Z79899 Other long term (current) drug therapy: Secondary | ICD-10-CM

## 2020-07-17 DIAGNOSIS — R531 Weakness: Secondary | ICD-10-CM

## 2020-07-17 DIAGNOSIS — Z20822 Contact with and (suspected) exposure to covid-19: Secondary | ICD-10-CM | POA: Diagnosis present

## 2020-07-17 DIAGNOSIS — Z955 Presence of coronary angioplasty implant and graft: Secondary | ICD-10-CM

## 2020-07-17 DIAGNOSIS — E785 Hyperlipidemia, unspecified: Secondary | ICD-10-CM | POA: Diagnosis present

## 2020-07-17 DIAGNOSIS — Z66 Do not resuscitate: Secondary | ICD-10-CM | POA: Diagnosis not present

## 2020-07-17 DIAGNOSIS — M545 Low back pain, unspecified: Secondary | ICD-10-CM | POA: Diagnosis not present

## 2020-07-17 DIAGNOSIS — L89109 Pressure ulcer of unspecified part of back, unspecified stage: Secondary | ICD-10-CM

## 2020-07-17 DIAGNOSIS — Z823 Family history of stroke: Secondary | ICD-10-CM

## 2020-07-17 DIAGNOSIS — M546 Pain in thoracic spine: Secondary | ICD-10-CM | POA: Diagnosis not present

## 2020-07-17 DIAGNOSIS — I5023 Acute on chronic systolic (congestive) heart failure: Secondary | ICD-10-CM | POA: Diagnosis not present

## 2020-07-17 DIAGNOSIS — Z87891 Personal history of nicotine dependence: Secondary | ICD-10-CM

## 2020-07-17 DIAGNOSIS — Z8701 Personal history of pneumonia (recurrent): Secondary | ICD-10-CM

## 2020-07-17 DIAGNOSIS — E43 Unspecified severe protein-calorie malnutrition: Secondary | ICD-10-CM | POA: Diagnosis not present

## 2020-07-17 DIAGNOSIS — R9431 Abnormal electrocardiogram [ECG] [EKG]: Secondary | ICD-10-CM

## 2020-07-17 DIAGNOSIS — R627 Adult failure to thrive: Secondary | ICD-10-CM | POA: Diagnosis not present

## 2020-07-17 DIAGNOSIS — D649 Anemia, unspecified: Secondary | ICD-10-CM | POA: Diagnosis present

## 2020-07-17 DIAGNOSIS — I2510 Atherosclerotic heart disease of native coronary artery without angina pectoris: Secondary | ICD-10-CM | POA: Diagnosis present

## 2020-07-17 DIAGNOSIS — E119 Type 2 diabetes mellitus without complications: Secondary | ICD-10-CM | POA: Diagnosis present

## 2020-07-17 DIAGNOSIS — Z8673 Personal history of transient ischemic attack (TIA), and cerebral infarction without residual deficits: Secondary | ICD-10-CM

## 2020-07-17 DIAGNOSIS — I5032 Chronic diastolic (congestive) heart failure: Secondary | ICD-10-CM

## 2020-07-17 DIAGNOSIS — L89156 Pressure-induced deep tissue damage of sacral region: Secondary | ICD-10-CM | POA: Diagnosis present

## 2020-07-17 DIAGNOSIS — L89101 Pressure ulcer of unspecified part of back, stage 1: Secondary | ICD-10-CM

## 2020-07-17 DIAGNOSIS — K219 Gastro-esophageal reflux disease without esophagitis: Secondary | ICD-10-CM | POA: Diagnosis present

## 2020-07-17 DIAGNOSIS — R059 Cough, unspecified: Secondary | ICD-10-CM | POA: Diagnosis not present

## 2020-07-17 DIAGNOSIS — G939 Disorder of brain, unspecified: Secondary | ICD-10-CM | POA: Diagnosis present

## 2020-07-17 DIAGNOSIS — N400 Enlarged prostate without lower urinary tract symptoms: Secondary | ICD-10-CM | POA: Diagnosis present

## 2020-07-17 DIAGNOSIS — C61 Malignant neoplasm of prostate: Secondary | ICD-10-CM | POA: Diagnosis present

## 2020-07-17 DIAGNOSIS — I110 Hypertensive heart disease with heart failure: Secondary | ICD-10-CM | POA: Diagnosis present

## 2020-07-17 DIAGNOSIS — Z7189 Other specified counseling: Secondary | ICD-10-CM | POA: Diagnosis not present

## 2020-07-17 DIAGNOSIS — I480 Paroxysmal atrial fibrillation: Secondary | ICD-10-CM | POA: Diagnosis not present

## 2020-07-17 DIAGNOSIS — Z6823 Body mass index (BMI) 23.0-23.9, adult: Secondary | ICD-10-CM | POA: Diagnosis not present

## 2020-07-17 DIAGNOSIS — E876 Hypokalemia: Principal | ICD-10-CM | POA: Diagnosis present

## 2020-07-17 DIAGNOSIS — L89102 Pressure ulcer of unspecified part of back, stage 2: Secondary | ICD-10-CM | POA: Diagnosis present

## 2020-07-17 DIAGNOSIS — C7951 Secondary malignant neoplasm of bone: Secondary | ICD-10-CM | POA: Diagnosis present

## 2020-07-17 DIAGNOSIS — F418 Other specified anxiety disorders: Secondary | ICD-10-CM | POA: Diagnosis not present

## 2020-07-17 DIAGNOSIS — Z87442 Personal history of urinary calculi: Secondary | ICD-10-CM

## 2020-07-17 DIAGNOSIS — Z7984 Long term (current) use of oral hypoglycemic drugs: Secondary | ICD-10-CM

## 2020-07-17 DIAGNOSIS — I5043 Acute on chronic combined systolic (congestive) and diastolic (congestive) heart failure: Secondary | ICD-10-CM | POA: Diagnosis present

## 2020-07-17 DIAGNOSIS — R69 Illness, unspecified: Secondary | ICD-10-CM

## 2020-07-17 LAB — CBC WITH DIFFERENTIAL/PLATELET
Abs Immature Granulocytes: 0.87 10*3/uL — ABNORMAL HIGH (ref 0.00–0.07)
Basophils Absolute: 0 10*3/uL (ref 0.0–0.1)
Basophils Relative: 0 %
Eosinophils Absolute: 0 10*3/uL (ref 0.0–0.5)
Eosinophils Relative: 0 %
HCT: 29.5 % — ABNORMAL LOW (ref 39.0–52.0)
Hemoglobin: 8.7 g/dL — ABNORMAL LOW (ref 13.0–17.0)
Immature Granulocytes: 10 %
Lymphocytes Relative: 18 %
Lymphs Abs: 1.6 10*3/uL (ref 0.7–4.0)
MCH: 24.2 pg — ABNORMAL LOW (ref 26.0–34.0)
MCHC: 29.5 g/dL — ABNORMAL LOW (ref 30.0–36.0)
MCV: 81.9 fL (ref 80.0–100.0)
Monocytes Absolute: 0.6 10*3/uL (ref 0.1–1.0)
Monocytes Relative: 7 %
Neutro Abs: 5.9 10*3/uL (ref 1.7–7.7)
Neutrophils Relative %: 65 %
Platelets: 125 10*3/uL — ABNORMAL LOW (ref 150–400)
RBC: 3.6 MIL/uL — ABNORMAL LOW (ref 4.22–5.81)
RDW: 21.3 % — ABNORMAL HIGH (ref 11.5–15.5)
WBC: 9 10*3/uL (ref 4.0–10.5)
nRBC: 0.3 % — ABNORMAL HIGH (ref 0.0–0.2)

## 2020-07-17 LAB — COMPREHENSIVE METABOLIC PANEL
ALT: 13 U/L (ref 0–44)
AST: 32 U/L (ref 15–41)
Albumin: 1.9 g/dL — ABNORMAL LOW (ref 3.5–5.0)
Alkaline Phosphatase: 466 U/L — ABNORMAL HIGH (ref 38–126)
Anion gap: 11 (ref 5–15)
BUN: 12 mg/dL (ref 8–23)
CO2: 26 mmol/L (ref 22–32)
Calcium: 7.8 mg/dL — ABNORMAL LOW (ref 8.9–10.3)
Chloride: 105 mmol/L (ref 98–111)
Creatinine, Ser: 0.63 mg/dL (ref 0.61–1.24)
GFR, Estimated: >60 mL/min (ref >60)
Glucose, Bld: 78 mg/dL (ref 70–99)
Potassium: 2.1 mmol/L — CL (ref 3.5–5.1)
Sodium: 142 mmol/L (ref 135–145)
Total Bilirubin: 0.6 mg/dL (ref 0.3–1.2)
Total Protein: 6.1 g/dL — ABNORMAL LOW (ref 6.5–8.1)

## 2020-07-17 LAB — URINALYSIS, ROUTINE W REFLEX MICROSCOPIC
Bilirubin Urine: NEGATIVE
Glucose, UA: NEGATIVE mg/dL
Hgb urine dipstick: NEGATIVE
Ketones, ur: 5 mg/dL — AB
Leukocytes,Ua: NEGATIVE
Nitrite: NEGATIVE
Protein, ur: 30 mg/dL — AB
Specific Gravity, Urine: 1.015 (ref 1.005–1.030)
pH: 6 (ref 5.0–8.0)

## 2020-07-17 LAB — TROPONIN I (HIGH SENSITIVITY)
Troponin I (High Sensitivity): 30 ng/L — ABNORMAL HIGH (ref <18)
Troponin I (High Sensitivity): 31 ng/L — ABNORMAL HIGH (ref <18)

## 2020-07-17 LAB — BASIC METABOLIC PANEL
Anion gap: 13 (ref 5–15)
BUN: 11 mg/dL (ref 8–23)
CO2: 27 mmol/L (ref 22–32)
Calcium: 8.1 mg/dL — ABNORMAL LOW (ref 8.9–10.3)
Chloride: 107 mmol/L (ref 98–111)
Creatinine, Ser: 0.67 mg/dL (ref 0.61–1.24)
GFR, Estimated: >60 mL/min (ref >60)
Glucose, Bld: 138 mg/dL — ABNORMAL HIGH (ref 70–99)
Potassium: 2.6 mmol/L — CL (ref 3.5–5.1)
Sodium: 147 mmol/L — ABNORMAL HIGH (ref 135–145)

## 2020-07-17 LAB — RESPIRATORY PANEL BY RT PCR (FLU A&B, COVID)
Influenza A by PCR: NEGATIVE
Influenza B by PCR: NEGATIVE
SARS Coronavirus 2 by RT PCR: NEGATIVE

## 2020-07-17 LAB — LACTIC ACID, PLASMA
Lactic Acid, Venous: 1.2 mmol/L (ref 0.5–1.9)
Lactic Acid, Venous: 1.1 mmol/L (ref 0.5–1.9)

## 2020-07-17 LAB — PROTIME-INR
INR: 1.5 — ABNORMAL HIGH (ref 0.8–1.2)
Prothrombin Time: 17.8 seconds — ABNORMAL HIGH (ref 11.4–15.2)

## 2020-07-17 LAB — BRAIN NATRIURETIC PEPTIDE: B Natriuretic Peptide: 1002 pg/mL — ABNORMAL HIGH (ref 0.0–100.0)

## 2020-07-17 MED ORDER — MORPHINE SULFATE (PF) 2 MG/ML IV SOLN: 2.0000 mg | INTRAVENOUS | Status: DC | PRN

## 2020-07-17 MED ORDER — POTASSIUM CHLORIDE CRYS ER 20 MEQ PO TBCR
40.0000 meq | EXTENDED_RELEASE_TABLET | Freq: Once | ORAL | Status: AC
  Administered 2020-07-17: 40 meq via ORAL
  Filled 2020-07-17: qty 2

## 2020-07-17 MED ORDER — CALCIUM CARBONATE 1250 (500 CA) MG PO TABS
500.0000 mg | ORAL_TABLET | Freq: Every day | ORAL | Status: DC
  Administered 2020-07-18: 500 mg via ORAL
  Filled 2020-07-17 (×2): qty 1

## 2020-07-17 MED ORDER — VITAMIN D (ERGOCALCIFEROL) 1.25 MG (50000 UNIT) PO CAPS: 50000.0000 [IU] | ORAL_CAPSULE | ORAL | Status: DC

## 2020-07-17 MED ORDER — HYDROCODONE-ACETAMINOPHEN 5-325 MG PO TABS
1.0000 | ORAL_TABLET | Freq: Once | ORAL | Status: AC
  Administered 2020-07-17: 1 via ORAL
  Filled 2020-07-17: qty 1

## 2020-07-17 MED ORDER — ENOXAPARIN SODIUM 40 MG/0.4ML ~~LOC~~ SOLN
40.0000 mg | SUBCUTANEOUS | Status: DC
  Administered 2020-07-17 – 2020-07-26 (×10): 40 mg via SUBCUTANEOUS
  Filled 2020-07-17 (×10): qty 0.4

## 2020-07-17 MED ORDER — LIDOCAINE 5 % EX PTCH
1.0000 | MEDICATED_PATCH | CUTANEOUS | Status: DC
  Administered 2020-07-17 – 2020-07-24 (×7): 1 via TRANSDERMAL
  Filled 2020-07-17 (×11): qty 1

## 2020-07-17 MED ORDER — ACETAMINOPHEN 650 MG RE SUPP: 650.0000 mg | Freq: Four times a day (QID) | RECTAL | Status: DC | PRN

## 2020-07-17 MED ORDER — POTASSIUM CHLORIDE 10 MEQ/100ML IV SOLN
10.0000 meq | Freq: Once | INTRAVENOUS | Status: AC
  Administered 2020-07-17: 10 meq via INTRAVENOUS
  Filled 2020-07-17: qty 100

## 2020-07-17 MED ORDER — SODIUM CHLORIDE 0.9% FLUSH: 3.0000 mL | Freq: Two times a day (BID) | INTRAVENOUS | Status: DC

## 2020-07-17 MED ORDER — ENZALUTAMIDE 80 MG PO TABS: 80.0000 mg | ORAL_TABLET | Freq: Every day | ORAL | Status: DC

## 2020-07-17 MED ORDER — NITROGLYCERIN 0.4 MG SL SUBL
0.4000 mg | SUBLINGUAL_TABLET | SUBLINGUAL | Status: DC | PRN
  Administered 2020-07-20: 0.4 mg via SUBLINGUAL
  Filled 2020-07-17: qty 1

## 2020-07-17 MED ORDER — ATORVASTATIN CALCIUM 20 MG PO TABS
20.0000 mg | ORAL_TABLET | Freq: Every day | ORAL | Status: DC
  Administered 2020-07-18: 20 mg via ORAL
  Filled 2020-07-17: qty 1

## 2020-07-17 MED ORDER — POLYETHYLENE GLYCOL 3350 17 G PO PACK
17.0000 g | PACK | Freq: Every day | ORAL | Status: DC
  Administered 2020-07-18 – 2020-07-26 (×7): 17 g via ORAL
  Filled 2020-07-17 (×10): qty 1

## 2020-07-17 MED ORDER — METOPROLOL TARTRATE 50 MG PO TABS
50.0000 mg | ORAL_TABLET | Freq: Two times a day (BID) | ORAL | Status: DC
  Administered 2020-07-17 – 2020-07-27 (×20): 50 mg via ORAL
  Filled 2020-07-17 (×17): qty 1
  Filled 2020-07-17: qty 2
  Filled 2020-07-17 (×2): qty 1

## 2020-07-17 MED ORDER — DOCUSATE SODIUM 100 MG PO CAPS
200.0000 mg | ORAL_CAPSULE | Freq: Two times a day (BID) | ORAL | Status: DC | PRN
  Administered 2020-07-24: 200 mg via ORAL
  Filled 2020-07-17: qty 2

## 2020-07-17 MED ORDER — VITAMIN D3 1.25 MG (50000 UT) PO CAPS: 50000.0000 [IU] | ORAL_CAPSULE | ORAL | Status: DC

## 2020-07-17 MED ORDER — ACETAMINOPHEN 325 MG PO TABS
650.0000 mg | ORAL_TABLET | Freq: Four times a day (QID) | ORAL | Status: DC | PRN
  Administered 2020-07-22: 650 mg via ORAL
  Filled 2020-07-17: qty 2

## 2020-07-17 NOTE — ED Notes (Addendum)
Date and time results received: 07/17/20 2138 (use smartphrase ".now" to insert current time)  Test: K+ Critical Value: 2.6 Name of Provider Notified: Pfiffer md Orders Received? Or Actions Taken?:waiting on orders

## 2020-07-17 NOTE — ED Provider Notes (Signed)
[facility] DEPT Provider Note   CSN: [id number] Arrival date & time: 07/17/20  1258     History Chief Complaint  Patient presents with  . Cough    Adrian Singh is a 86 y.o. male.  HPI Patient was discharged in the hospital 3 days ago.  He reports he has not done well since getting back home.  He is severely weak and cannot reposition himself in the bed.  Initially, with help from family and home health they thought they could care for him at home.  He reports however he is having difficulty moving and toileting.  He has wounds on his back started becoming more painful.  He also has increasing cough and his son is worried that his aspiration pneumonia is getting worse.  He reports he feels very generally weak and cannot assist in his own care.    Past Medical History:  Diagnosis Date  . Arthritis    "legs, back" (10/06/2015)  . Atrial fibrillation ([place])    x1  . Atrial fibrillation ([place])   . CAD (coronary artery disease)    nonobstructive  . Cancer ([place])    skin cancer on ear (froze it off) and back (cut it off)  . Dysrhythmia   . Esophageal reflux    hx of  . History of kidney stones   . Hx of heart artery stent   . Hypertrophy of prostate without urinary obstruction and other lower urinary tract symptoms (LUTS)   . Osteoarthrosis, unspecified whether generalized or localized, unspecified site   . Prostate cancer ([place])   . Skin cancer   . Stroke [facility]) 03/2014   "had a series of mini strokes; maybe 4"; denies residual on 10/06/2015  . Thrombocytopenia ([place])   . Type II diabetes mellitus ([place])    type 2  . Unspecified essential hypertension     Patient Active Problem List   Diagnosis Date Noted  . Ileus ([place]) 07/05/2020  . Hypokalemia 07/05/2020  . Atrial fibrillation with RVR ([place]) 07/05/2020  . Postural dizziness with presyncope 06/09/2020  . Severe protein-calorie malnutrition ([place]) 05/06/2020  . Palliative care by specialist   .  Goals of care, counseling/discussion   . DNR (do not resuscitate) discussion   . Adult failure to thrive   . Need for emotional support   . Pressure injury of skin 05/04/2020  . Stroke ([place]) 05/02/2020  . Weakness generalized 04/30/2020  . Fall at home, initial encounter 04/30/2020  . Acute on chronic anemia 04/30/2020  . Elevated CK 04/30/2020  . Chronic kidney disease, stage 3a ([place]) 04/30/2020  . Elevated troponin 04/30/2020  . Fatigue 04/29/2020  . Malnutrition of mild degree ([place]) 04/06/2019  . Diabetic foot ulcer ([place]) 04/06/2019  . Mood disorder ([place]) 04/06/2019  . Aortic atherosclerosis ([place]) 02/13/2017  . Prostate cancer ([place]) 02/13/2017  . Primary malignant neoplasm of prostate metastatic to bone ([place]) 02/13/2017  . Orthostatic hypotension 03/19/2016  . CAD S/P percutaneous coronary angioplasty 02/28/2016  . Atherosclerosis of native coronary artery of native heart with angina pectoris ([place])   . Abnormal nuclear stress test 09/22/2015  . Thrombocytopenia ([place])   . Advanced directives, counseling/discussion 09/27/2014  . PAF (paroxysmal atrial fibrillation) ([place]) 04/12/2014  . History of CVA (cerebrovascular accident) 03/31/2014  . CKD stage 3 due to type 2 diabetes mellitus ([place])   . Uncontrolled type 2 diabetes mellitus with diabetic polyneuropathy, without long-term current use of insulin ([place]) 11/05/2011  . Routine general medical examination  at a health care facility 05/07/2011  . Essential hypertension 12/23/2009  . GERD 12/23/2009  . BPH (benign prostatic hyperplasia) 12/23/2009  . OSTEOARTHRITIS 12/23/2009    Past Surgical History:  Procedure Laterality Date  . BACK SURGERY     2001  . CARDIAC CATHETERIZATION  12/2007  . CARDIAC CATHETERIZATION N/A 09/22/2015   Procedure: Left Heart Cath and Coronary Angiography;  Surgeon: Peter M Martinique, MD;  Location: [facility];  Service: Cardiovascular;  Laterality: N/A;  . CARDIAC CATHETERIZATION N/A 10/06/2015    Procedure: Coronary Stent Intervention;  Surgeon: Peter M Martinique, MD;  Location: [facility];  Service: Cardiovascular;  Laterality: N/A;  . CARDIAC CATHETERIZATION N/A 02/29/2016   Procedure: Left Heart Cath and Coronary Angiography;  Surgeon: Jettie Booze, MD;  Location: [facility];  Service: Cardiovascular;  Laterality: N/A;  . COLONOSCOPY W/ BIOPSIES AND POLYPECTOMY    . CORONARY STENT PLACEMENT  10/06/2015   [name]  with DES  . EAR CYST EXCISION N/A 04/06/2015   Procedure: EXCISION OF SCALP CYST;  Surgeon: Donnie Mesa, MD;  Location: [facility];  Service: General;  Laterality: N/A;  . ESOPHAGOGASTRODUODENOSCOPY (EGD) WITH ESOPHAGEAL DILATION  2001   with dilation  . LUMBAR DISC SURGERY  09/26/1999   "cleaned out arthritis and bone spurs"  . SHOULDER ARTHROSCOPY W/ ROTATOR CUFF REPAIR Left 2005  . THULIUM LASER TURP (TRANSURETHRAL RESECTION OF PROSTATE) N/A 01/21/2017   Procedure: Marcelino Duster LASER TURP (TRANSURETHRAL RESECTION OF PROSTATE) CAUTERIZATION OF BLADDER LESION;  Surgeon: Franchot Gallo, MD;  Location: [facility];  Service: Urology;  Laterality: N/A;       Family History  Problem Relation Age of Onset  . Stroke Father   . Peripheral vascular disease Father        amputation  . Heart failure Mother        CHF  . Coronary artery disease Mother   . Heart attack Mother 66       Multiple  . COPD Brother   . Heart disease Brother   . Cancer Brother        Bone  . Lung cancer Sister     Social History   Tobacco Use  . Smoking status: Former Smoker    Years: 3.00    Types: Cigarettes  . Smokeless tobacco: Never Used  . Tobacco comment: " Quit smoking by age 75; was a someday smoker "  Vaping Use  . Vaping Use: Never used  Substance Use Topics  . Alcohol use: No  . Drug use: No    Home Medications Prior to Admission medications   Medication Sig Start Date End Date Taking? Authorizing Provider  acetaminophen (TYLENOL) 325 MG tablet Take 650 mg by mouth  every 12 (twelve) hours as needed for mild pain or moderate pain.    [provider]  Amino Acids-Protein Hydrolys (FEEDING SUPPLEMENT, PRO-STAT SUGAR FREE 64,) LIQD Take 30 mLs by mouth in the morning and at bedtime.    [provider]  amiodarone (PACERONE) 200 MG tablet Take 1 tablet (200 mg total) by mouth 2 (two) times daily. 07/14/20   Barb Merino, MD  Ascorbic Acid (VITAMIN C) 1000 MG tablet Take 1,000 mg by mouth daily.    [provider]  atorvastatin (LIPITOR) 20 MG tablet Take 20 mg by mouth at bedtime.     [provider]  B Complex Vitamins (VITAMIN B COMPLEX PO) Take 1 tablet by mouth daily.  [provider]  BAYER MICROLET LANCETS lancets Use to test blood sugar once daily dx: E11.40 10/18/15   [name], MD  Blood Glucose Monitoring Suppl (CONTOUR NEXT EZ MONITOR) w/Device KIT USE TO TEST BLOOD SUGAR ONCE DAILY. Dx Code E11.40 06/13/16   Viviana Simpler I, MD  calcium carbonate (CALCIUM 600) 1500 (600 Ca) MG TABS tablet Take 600 mg of elemental calcium by mouth daily.     [provider]  Cholecalciferol (VITAMIN D3) 1.25 MG (50000 UT) CAPS Take 50,000 Units by mouth once a week. Saturday    [provider]  cyanocobalamin 1000 MCG tablet Take 1 tablet (1,000 mcg total) by mouth at bedtime. 07/14/20   Barb Merino, MD  docusate sodium (COLACE) 100 MG capsule Take 200 mg by mouth 2 (two) times daily as needed for mild constipation.    [provider]  enzalutamide Gillermina Phy) 80 MG tablet Take 80 mg by mouth daily.    [provider]  furosemide (LASIX) 40 MG tablet Take 40 mg by mouth daily as needed.    [provider]  glimepiride (AMARYL) 2 MG tablet Take 1 tablet (2 mg total) by mouth every morning. Patient taking differently: Take 2 mg by mouth daily.  06/13/20 06/13/21  Georgette Shell, MD  glucose blood (CONTOUR NEXT TEST) test strip Check blood sugar once daily and as  directed.Dx Code E11.40 03/19/19   [name], MD  metoprolol tartrate (LOPRESSOR) 50 MG tablet Take 1 tablet (50 mg total) by mouth in the morning, at noon, and at bedtime. 07/15/20 07/15/21  Barb Merino, MD  Multiple Vitamin (MULTIVITAMIN WITH MINERALS) TABS tablet Take 1 tablet by mouth daily.     [provider]  nitroGLYCERIN (NITROSTAT) 0.4 MG SL tablet Place 1 tablet (0.4 mg total) under the tongue every 5 (five) minutes as needed for chest pain. Patient taking differently: Place 0.4 mg under the tongue every 5 (five) minutes as needed for chest pain. If no relief, call MD 05/15/18   [name], MD  polyethylene glycol (MIRALAX / GLYCOLAX) 17 g packet Take 17 g by mouth daily.    [provider]  traMADol (ULTRAM) 50 MG tablet Take 1 tablet (50 mg total) by mouth 3 (three) times daily as needed. Patient taking differently: Take 50 mg by mouth 3 (three) times daily as needed for moderate pain.  06/13/20   Georgette Shell, MD    Allergies    Soma [carisoprodol] and Doxazosin  Review of Systems   Review of Systems 10 systems reviewed and negative except as per HPI Physical Exam Updated Vital Signs BP (!) 123/48 (BP Location: Right Arm)   Pulse 71   Temp 97.8 F (36.6 C) (Oral)   Resp 18   Ht '5\' 10"'  (1.778 m)   Wt 75.8 kg   SpO2 98%   BMI 23.96 kg/m   Physical Exam Constitutional:      Comments: Alert without respiratory distress.  Very deconditioned.  Mental status clear.  HENT:     Head: Normocephalic and atraumatic.     Mouth/Throat:     Pharynx: Oropharynx is clear.  Eyes:     Extraocular Movements: Extraocular movements intact.  Cardiovascular:     Comments: Bradycardia.  Ectopic beats. Pulmonary:     Comments: No respiratory distress.  Some crackles at the bases.  Airflow symmetric. Abdominal:     General: There is no distension.     Palpations: Abdomen is soft.  Tenderness: There is no abdominal tenderness. There is no  guarding.  Genitourinary:    Comments: Normal appearance.  No rash or maceration in the groin folds. Musculoskeletal:     Comments: Upper extremities have diffuse soft edema.  Skin is thin.  Multiple ecchymoses.  Lower extremities do not have significant peripheral edema.  Patient is wearing compression socks.  No active wounds on the feet.  Fairly superficial pressure wound on the spine in the thorax.  Skin:    General: Skin is warm and dry.  Neurological:     Comments: Patient is alert.  He is situationally oriented.  His speech is appropriate.  He is generally very weak.  He does not appear to have focal neurologic motor deficit.  Psychiatric:        Mood and Affect: Mood normal.     ED Results / Procedures / Treatments   Labs (all labs ordered are listed, but only abnormal results are displayed) Labs Reviewed  COMPREHENSIVE METABOLIC PANEL - Abnormal; Notable for the following components:      Result Value   Potassium 2.1 (*)    Calcium 7.8 (*)    Total Protein 6.1 (*)    Albumin 1.9 (*)    Alkaline Phosphatase 466 (*)    All other components within normal limits  BRAIN NATRIURETIC PEPTIDE - Abnormal; Notable for the following components:   B Natriuretic Peptide 1,002.0 (*)    All other components within normal limits  CBC WITH DIFFERENTIAL/PLATELET - Abnormal; Notable for the following components:   RBC 3.60 (*)    Hemoglobin 8.7 (*)    HCT 29.5 (*)    MCH 24.2 (*)    MCHC 29.5 (*)    RDW 21.3 (*)    Platelets 125 (*)    nRBC 0.3 (*)    Abs Immature Granulocytes 0.87 (*)    All other components within normal limits  PROTIME-INR - Abnormal; Notable for the following components:   Prothrombin Time 17.8 (*)    INR 1.5 (*)    All other components within normal limits  TROPONIN I (HIGH SENSITIVITY) - Abnormal; Notable for the following components:   Troponin I (High Sensitivity) 30 (*)    All other components within normal limits  RESPIRATORY PANEL BY RT PCR (FLU A&B,  COVID)  URINE CULTURE  CULTURE, BLOOD (ROUTINE X 2)  CULTURE, BLOOD (ROUTINE X 2)  LACTIC ACID, PLASMA  LACTIC ACID, PLASMA  URINALYSIS, ROUTINE W REFLEX MICROSCOPIC  MAGNESIUM  TROPONIN I (HIGH SENSITIVITY)    EKG EKG Interpretation  Date/Time:  Sunday July 17 2020 14:48:18 EDT Ventricular Rate:  80 PR Interval:    QRS Duration: 105 QT Interval:  542 QTC Calculation: 581 R Axis:   -11 Text Interpretation: Sinus rhythm Multiform ventricular premature complexes Abnormal R-wave progression, early transition Inferior infarct, age indeterminate Prolonged QT interval agree, rythm slower than previous. SR vs slow afib with int sinus beats and PVC Confirmed by Charlesetta Shanks [phone]) on 07/17/2020 3:35:27 PM   Radiology DG Chest Port 1 View  Result Date: 07/17/2020 CLINICAL DATA:  Cough EXAM: PORTABLE CHEST 1 VIEW COMPARISON:  Chest radiograph dated 07/09/2020 FINDINGS: The heart size and mediastinal contours are within normal limits. Both lungs are clear. The visualized skeletal structures are unremarkable. IMPRESSION: No active disease. Electronically Signed   By: Zerita Boers M.D.   On: 07/17/2020 14:07    Procedures Procedures (including critical care time)  Medications Ordered in ED Medications  potassium chloride  SA (KLOR-CON) CR tablet 40 mEq (has no administration in time range)  potassium chloride 10 mEq in 100 mL IVPB (10 mEq Intravenous New Bag/Given 07/17/20 1550)    ED Course  I have reviewed the triage vital signs and the nursing notes.  Pertinent labs & imaging results that were available during my care of the patient were reviewed by me and considered in my medical decision making (see chart for details).    MDM Rules/Calculators/A&P                          Consult: [facility] hospitalist for admission  Patient presents with failure to thrive after discharge from the hospital.  He has multiple severe comorbid illnesses.  Family is having difficulty  providing enough care.  In the meantime, patient feels that he has gotten even weaker and has increased cough.  Lab work reveals significant hypokalemia likely contributing to generalized weakness.  Patient will need potassium supplement and stabilization.  Patient will likely need discharge to SNF after medical treatment. Final Clinical Impression(s) / ED Diagnoses Final diagnoses:  Hypokalemia  General weakness  Severe comorbid illness    Rx / DC Orders ED Discharge Orders    None       Charlesetta Shanks, MD 07/17/20 1554

## 2020-07-17 NOTE — Consult Note (Signed)
WOC consulted for Stage 1 Pressure Injury; if Stage 1 pressure injury implement skin care order set set which would allow placement of silicone foam to intact skin for protection. Change every 3 days. Orders updated.    Re consult if needed, will not follow at this time. Thanks  Mivaan Corbitt [employer], RN,CWOCN, CNS, [place] [phone])

## 2020-07-17 NOTE — H&P (Signed)
History and Physical        Hospital Admission Note Date: 07/17/2020  Patient name: Adrian Singh Medical record number: [id number] Date of birth: Jan 12, 1934 Age: 86 y.o. Gender: male  PCP: [name], MD  Patient coming from: Home Lives with: Wife At baseline, ambulates: Bedbound  Chartered loss adjuster Complaint  Patient presents with  . Cough      HPI:   This is an 86 year old male with past medical history of CAD, systolic heart failure, paroxysmal atrial fibrillation not on anticoagulation, aspiration pneumonia, debility, type 2 diabetes on oral hypoglycemics, chronic anemia, metastatic prostate cancer to bone, brain mass who was discharged from the hospital only 2 days ago (10/29) after treatment for HCAP and A. fib with RVR who presents to the ED with cough and failure to thrive since discharge.  Patient is currently too weak to provide history and reposition himself in the bed and so son at bedside provided much of the history.  States that since he was discharged from the hospital he has been very weak with low appetite and has been having a dry cough.  No associated fever or night sweats or shortness of breath.  They were concerned that he has been aspirating.  Additionally, family was going to try and take care of the patient at home however they were having difficulty taking care of him.  Also states that since being discharged she has developed a sore on his mid back.  Patient also complaining of mid back pain.  ED Course: Afebrile and hemodynamically stable on room air.  Notable labs: K2.1, ALK phos 466 (chronically elevated), albumin 1.9, BNP 1002, troponin 31, lactic acid 1.2, CBC Hb 8.7, platelets 125, INR 1.5.  CXR unremarkable.  He was given potassium supplementation in the ED  Vitals:   07/17/20 1449 07/17/20 1552  BP: (!) 149/68 (!) 123/48  Pulse: 70  71  Resp: 18 18  Temp: 97.8 F (36.6 C)   SpO2: 96% 98%     Review of Systems:  Review of Systems  Constitutional: Positive for malaise/fatigue. Negative for chills and fever.  Respiratory: Positive for cough. Negative for shortness of breath and wheezing.   Cardiovascular: Positive for palpitations. Negative for chest pain and leg swelling.  Gastrointestinal: Negative for nausea and vomiting.  Musculoskeletal: Positive for back pain.  Skin: Positive for rash.  Neurological: Positive for weakness. Negative for focal weakness.  All other systems reviewed and are negative.   Medical/Social/Family History   Past Medical History: Past Medical History:  Diagnosis Date  . Arthritis    "legs, back" (10/06/2015)  . Atrial fibrillation ([place])    x1  . Atrial fibrillation ([place])   . CAD (coronary artery disease)    nonobstructive  . Cancer ([place])    skin cancer on ear (froze it off) and back (cut it off)  . Dysrhythmia   . Esophageal reflux    hx of  . History of kidney stones   . Hx of heart artery stent   . Hypertrophy of prostate without urinary obstruction and other lower urinary tract symptoms (LUTS)   . Osteoarthrosis, unspecified whether generalized or localized, unspecified site   .  Prostate cancer ([place])   . Skin cancer   . Stroke [facility]) 03/2014   "had a series of mini strokes; maybe 4"; denies residual on 10/06/2015  . Thrombocytopenia ([place])   . Type II diabetes mellitus ([place])    type 2  . Unspecified essential hypertension     Past Surgical History:  Procedure Laterality Date  . BACK SURGERY     2001  . CARDIAC CATHETERIZATION  12/2007  . CARDIAC CATHETERIZATION N/A 09/22/2015   Procedure: Left Heart Cath and Coronary Angiography;  Surgeon: Peter M Martinique, MD;  Location: [place] CV LAB;  Service: Cardiovascular;  Laterality: N/A;  . CARDIAC CATHETERIZATION N/A 10/06/2015   Procedure: Coronary Stent Intervention;  Surgeon: Peter M Martinique, MD;  Location: [place]  CV LAB;  Service: Cardiovascular;  Laterality: N/A;  . CARDIAC CATHETERIZATION N/A 02/29/2016   Procedure: Left Heart Cath and Coronary Angiography;  Surgeon: Jettie Booze, MD;  Location: [place] CV LAB;  Service: Cardiovascular;  Laterality: N/A;  . COLONOSCOPY W/ BIOPSIES AND POLYPECTOMY    . CORONARY STENT PLACEMENT  10/06/2015   [name]  with DES  . EAR CYST EXCISION N/A 04/06/2015   Procedure: EXCISION OF SCALP CYST;  Surgeon: Donnie Mesa, MD;  Location: [place];  Service: General;  Laterality: N/A;  . ESOPHAGOGASTRODUODENOSCOPY (EGD) WITH ESOPHAGEAL DILATION  2001   with dilation  . LUMBAR DISC SURGERY  09/26/1999   "cleaned out arthritis and bone spurs"  . SHOULDER ARTHROSCOPY W/ ROTATOR CUFF REPAIR Left 2005  . THULIUM LASER TURP (TRANSURETHRAL RESECTION OF PROSTATE) N/A 01/21/2017   Procedure: Marcelino Duster LASER TURP (TRANSURETHRAL RESECTION OF PROSTATE) CAUTERIZATION OF BLADDER LESION;  Surgeon: Franchot Gallo, MD;  Location: [facility];  Service: Urology;  Laterality: N/A;    Medications: Prior to Admission medications   Medication Sig Start Date End Date Taking? Authorizing Provider  acetaminophen (TYLENOL) 325 MG tablet Take 650 mg by mouth every 12 (twelve) hours as needed for mild pain or moderate pain.    [provider]  Amino Acids-Protein Hydrolys (FEEDING SUPPLEMENT, PRO-STAT SUGAR FREE 64,) LIQD Take 30 mLs by mouth in the morning and at bedtime.    [provider]  amiodarone (PACERONE) 200 MG tablet Take 1 tablet (200 mg total) by mouth 2 (two) times daily. 07/14/20   Barb Merino, MD  Ascorbic Acid (VITAMIN C) 1000 MG tablet Take 1,000 mg by mouth daily.    [provider]  atorvastatin (LIPITOR) 20 MG tablet Take 20 mg by mouth at bedtime.     [provider]  B Complex Vitamins (VITAMIN B COMPLEX PO) Take 1 tablet by mouth daily.    [provider]  BAYER MICROLET LANCETS lancets Use to test blood sugar once daily dx:  E11.40 10/18/15   [name], MD  Blood Glucose Monitoring Suppl (CONTOUR NEXT EZ MONITOR) w/Device KIT USE TO TEST BLOOD SUGAR ONCE DAILY. Dx Code E11.40 06/13/16   Viviana Simpler I, MD  calcium carbonate (CALCIUM 600) 1500 (600 Ca) MG TABS tablet Take 600 mg of elemental calcium by mouth daily.     [provider]  Cholecalciferol (VITAMIN D3) 1.25 MG (50000 UT) CAPS Take 50,000 Units by mouth once a week. Saturday    [provider]  cyanocobalamin 1000 MCG tablet Take 1 tablet (1,000 mcg total) by mouth at bedtime. 07/14/20   Barb Merino, MD  docusate sodium (COLACE) 100 MG capsule Take 200 mg by mouth 2 (two) times daily  as needed for mild constipation.    [provider]  enzalutamide Gillermina Phy) 80 MG tablet Take 80 mg by mouth daily.    [provider]  furosemide (LASIX) 40 MG tablet Take 40 mg by mouth daily as needed.    [provider]  glimepiride (AMARYL) 2 MG tablet Take 1 tablet (2 mg total) by mouth every morning. Patient taking differently: Take 2 mg by mouth daily.  06/13/20 06/13/21  Georgette Shell, MD  glucose blood (CONTOUR NEXT TEST) test strip Check blood sugar once daily and as directed.Dx Code E11.40 03/19/19   [name], MD  metoprolol tartrate (LOPRESSOR) 50 MG tablet Take 1 tablet (50 mg total) by mouth in the morning, at noon, and at bedtime. 07/15/20 07/15/21  Barb Merino, MD  Multiple Vitamin (MULTIVITAMIN WITH MINERALS) TABS tablet Take 1 tablet by mouth daily.     [provider]  nitroGLYCERIN (NITROSTAT) 0.4 MG SL tablet Place 1 tablet (0.4 mg total) under the tongue every 5 (five) minutes as needed for chest pain. Patient taking differently: Place 0.4 mg under the tongue every 5 (five) minutes as needed for chest pain. If no relief, call MD 05/15/18   [name], MD  polyethylene glycol (MIRALAX / GLYCOLAX) 17 g packet Take 17 g by mouth daily.    [provider]  traMADol  (ULTRAM) 50 MG tablet Take 1 tablet (50 mg total) by mouth 3 (three) times daily as needed. Patient taking differently: Take 50 mg by mouth 3 (three) times daily as needed for moderate pain.  06/13/20   Georgette Shell, MD    Allergies:   Allergies  Allergen Reactions  . Soma [Carisoprodol] Other (See Comments)    "did a number on me"  . Doxazosin Other (See Comments)    dizziness    Social History:  reports that he has quit smoking. His smoking use included cigarettes. He quit after 3.00 years of use. He has never used smokeless tobacco. He reports that he does not drink alcohol and does not use drugs.  Family History: Family History  Problem Relation Age of Onset  . Stroke Father   . Peripheral vascular disease Father        amputation  . Heart failure Mother        CHF  . Coronary artery disease Mother   . Heart attack Mother 20       Multiple  . COPD Brother   . Heart disease Brother   . Cancer Brother        Bone  . Lung cancer Sister      Objective   Physical Exam: Blood pressure (!) 123/48, pulse 71, temperature 97.8 F (36.6 C), temperature source Oral, resp. rate 18, height _0  (1.778 m), weight 75.8 kg, SpO2 98 %.  Physical Exam Vitals and nursing note reviewed. Exam conducted with a chaperone present.  Constitutional:      Comments: Chronically ill-appearing and weak  HENT:     Head:     Comments: Temporal wasting    Mouth/Throat:     Mouth: Mucous membranes are moist.  Eyes:     Conjunctiva/sclera: Conjunctivae normal.  Cardiovascular:     Rate and Rhythm: Bradycardia present. Rhythm irregular.  Pulmonary:     Effort: Pulmonary effort is normal. No respiratory distress.     Breath sounds: No wheezing.     Comments: Dry coughing while eating applesauce Abdominal:     General: Abdomen  is flat. There is no distension.  Musculoskeletal:       Arms:     Comments: Generalized weakness  Skin:    Coloration: Skin is pale.         Neurological:     Mental Status: He is alert.  Psychiatric:        Thought Content: Thought content normal.     LABS on Admission: I have personally reviewed all the labs and imaging below    Basic Metabolic Panel: Recent Labs  Lab 07/13/20 0501 07/17/20 1355  NA 139 142  K 4.1 2.1*  CL 107 105  CO2 24 26  GLUCOSE 140* 78  BUN 26* 12  CREATININE 0.81 0.63  CALCIUM 7.7* 7.8*  MG 2.0  --   PHOS 3.3  --    Liver Function Tests: Recent Labs  Lab 07/13/20 0501 07/17/20 1355  AST 43* 32  ALT 21 13  ALKPHOS 492* 466*  BILITOT 0.5 0.6  PROT 6.2* 6.1*  ALBUMIN 1.8* 1.9*   No results for input(s): LIPASE, AMYLASE in the last 168 hours. No results for input(s): AMMONIA in the last 168 hours. CBC: Recent Labs  Lab 07/13/20 0501 07/13/20 0501 07/17/20 1355  WBC 10.6*  --  9.0  NEUTROABS 7.1   < > 5.9  HGB 9.4*  --  8.7*  HCT 31.7*  --  29.5*  MCV 84.5   < > 81.9  PLT 97*  --  125*   < > = values in this interval not displayed.   Cardiac Enzymes: No results for input(s): CKTOTAL, CKMB, CKMBINDEX, TROPONINI in the last 168 hours. BNP: Invalid input(s): POCBNP CBG: Recent Labs  Lab 07/15/20 0406 07/15/20 0749  GLUCAP 146* 166*    Radiological Exams on Admission:  DG Chest Port 1 View  Result Date: 07/17/2020 CLINICAL DATA:  Cough EXAM: PORTABLE CHEST 1 VIEW COMPARISON:  Chest radiograph dated 07/09/2020 FINDINGS: The heart size and mediastinal contours are within normal limits. Both lungs are clear. The visualized skeletal structures are unremarkable. IMPRESSION: No active disease. Electronically Signed   By: Zerita Boers M.D.   On: 07/17/2020 14:07      EKG: Independently reviewed.    A & P   Principal Problem:   Failure to thrive in adult Active Problems:   PAF (paroxysmal atrial fibrillation) (HCC)   Prostate cancer ([place])   Primary malignant neoplasm of prostate metastatic to bone (HCC)   Pressure ulcer of back   Severe protein-calorie  malnutrition (HCC)   Back pain   Prolonged QT interval   Chronic systolic CHF (congestive heart failure) ([place])   1. Failure to thrive a. Multifactorial: Malignancy, debility, poor oral intake, hypokalemia b. Family having difficulty taking care of patient at home, likely more appropriate for SNF vs. hospice facility c. PT eval d. Palliative care consulted  2. Cough secondary to aspiration without aspiration pneumonia a. Has a history of aspiration pneumonia but currently without consolidation on chest x-ray, no leukocytosis, afebrile and no shortness of breath b. Aspiration precautions c. Dysphagia 2 diet as per previous hospitalization  3. Hypokalemia a. Repleting in the ED, follow-up repeat labs b. Check magnesium  4. Paroxysmal atrial fibrillation a. Recently diagnosed at last hospitalization with A. fib with RVR and put on metoprolol twice daily and amiodarone b. Not on anticoagulation due to falls c. Continue metoprolol but at twice daily dosing for now (3 times daily at home) and hold amiodarone due to prolonged QT d. Telemetry  5. Prolonged QTc a. QTc 581 ms b. Replete electrolytes c. Hold QT prolonging agents d. Telemetry  6. Metastatic prostate cancer to bone  Brain mass concerning for malignancy a. Elevated Alk phos likely from bony mets b. Continue home enzalutamide, calcium and vitamin D  7. Back pain likely secondary to metastatic disease a. Continue as needed morphine, Norco and Lidoderm patch b. Candidate for palliative radiation? If no improvement then would consider discussing with oncology c. Palliative care consulted  8. Combined systolic and diastolic heart failure a. BNP 1000, not in overt heart failure exacerbation b. Currently tolerating room air c. will hold Lasix due to his significant hypokalemia for now  9. Diabetes a. Hold home hypoglycemics as glucose is 78  10. Stage I pressure ulcer on back a. WOCN  11. Hyperlipidemia a. Continue  statin  12. Severe protein calorie malnutrition a. Dietary consult   DVT prophylaxis: Lovenox   Code Status: Partial Code  Diet: Dysphagia 2 Family Communication: Admission, patients condition and plan of care including tests being ordered have been discussed with the patient who indicates understanding and agrees with the plan and Code Status. Patient's son was updated  Disposition Plan: The appropriate patient status for this patient is INPATIENT. Inpatient status is judged to be reasonable and necessary in order to provide the required intensity of service to ensure the patient's safety. The patient's presenting symptoms, physical exam findings, and initial radiographic and laboratory data in the context of their chronic comorbidities is felt to place them at high risk for further clinical deterioration. Furthermore, it is not anticipated that the patient will be medically stable for discharge from the hospital within 2 midnights of admission. The following factors support the patient status of inpatient.   " The patient's presenting symptoms include failure to thrive, dry cough. " The worrisome physical exam findings include generalized weakness, stage I pressure ulcer, point tenderness of spine " The initial radiographic and laboratory data are worrisome because of hypokalemia. " The chronic co-morbidities include metastatic prostate cancer, paroxysmal atrial fibrillation.   * I certify that at the point of admission it is my clinical judgment that the patient will require inpatient hospital care spanning beyond 2 midnights from the point of admission due to high intensity of service, high risk for further deterioration and high frequency of surveillance required.*   Status is: Inpatient  Remains inpatient appropriate because:Persistent severe electrolyte disturbances, Unsafe d/c plan, IV treatments appropriate due to intensity of illness or inability to take PO and Inpatient level of care  appropriate due to severity of illness   Dispo: The patient is from: Home              Anticipated d/c is to: SNF              Anticipated d/c date is: 3 days              Patient currently is not medically stable to d/c.    Consultants  . Palliative care  Procedures  . None  Time Spent on Admission: 64 minutes    Harold Hedge, DO [facility]  07/17/2020, 4:52 PM

## 2020-07-17 NOTE — ED Notes (Signed)
Date and time results received: 07/17/20 1514 (use smartphrase ".now" to insert current time)  Test: potassium  Critical Value: 2.1  Name of Provider Notified: Charlesetta Shanks, MD  Orders Received? Or Actions Taken?: No orders given at this time

## 2020-07-17 NOTE — ED Triage Notes (Signed)
Increased cough since last discharge. Also c/o new pressure wounds on back.

## 2020-07-18 ENCOUNTER — Inpatient Hospital Stay: Payer: Medicare Other | Attending: Internal Medicine

## 2020-07-18 ENCOUNTER — Encounter (HOSPITAL_COMMUNITY): Payer: Self-pay | Admitting: Internal Medicine

## 2020-07-18 ENCOUNTER — Telehealth: Payer: Self-pay

## 2020-07-18 DIAGNOSIS — R627 Adult failure to thrive: Secondary | ICD-10-CM

## 2020-07-18 DIAGNOSIS — R531 Weakness: Secondary | ICD-10-CM

## 2020-07-18 DIAGNOSIS — Z515 Encounter for palliative care: Secondary | ICD-10-CM

## 2020-07-18 DIAGNOSIS — Z7189 Other specified counseling: Secondary | ICD-10-CM

## 2020-07-18 LAB — BASIC METABOLIC PANEL
Anion gap: 9 (ref 5–15)
Anion gap: 11 (ref 5–15)
BUN: 11 mg/dL (ref 8–23)
BUN: 10 mg/dL (ref 8–23)
CO2: 29 mmol/L (ref 22–32)
CO2: 24 mmol/L (ref 22–32)
Calcium: 7.8 mg/dL — ABNORMAL LOW (ref 8.9–10.3)
Calcium: 7.6 mg/dL — ABNORMAL LOW (ref 8.9–10.3)
Chloride: 105 mmol/L (ref 98–111)
Chloride: 105 mmol/L (ref 98–111)
Creatinine, Ser: 0.64 mg/dL (ref 0.61–1.24)
Creatinine, Ser: 0.57 mg/dL — ABNORMAL LOW (ref 0.61–1.24)
GFR, Estimated: >60 mL/min (ref >60)
GFR, Estimated: >60 mL/min (ref >60)
Glucose, Bld: 100 mg/dL — ABNORMAL HIGH (ref 70–99)
Glucose, Bld: 89 mg/dL (ref 70–99)
Potassium: 2.3 mmol/L — CL (ref 3.5–5.1)
Potassium: 3.7 mmol/L (ref 3.5–5.1)
Sodium: 143 mmol/L (ref 135–145)
Sodium: 140 mmol/L (ref 135–145)

## 2020-07-18 LAB — CBC
HCT: 29.6 % — ABNORMAL LOW (ref 39.0–52.0)
Hemoglobin: 8.8 g/dL — ABNORMAL LOW (ref 13.0–17.0)
MCH: 24.7 pg — ABNORMAL LOW (ref 26.0–34.0)
MCHC: 29.7 g/dL — ABNORMAL LOW (ref 30.0–36.0)
MCV: 83.1 fL (ref 80.0–100.0)
Platelets: 114 10*3/uL — ABNORMAL LOW (ref 150–400)
RBC: 3.56 MIL/uL — ABNORMAL LOW (ref 4.22–5.81)
RDW: 21.2 % — ABNORMAL HIGH (ref 11.5–15.5)
WBC: 8.1 10*3/uL (ref 4.0–10.5)
nRBC: 0.2 % (ref 0.0–0.2)

## 2020-07-18 LAB — URINE CULTURE: Culture: NO GROWTH

## 2020-07-18 LAB — MAGNESIUM: Magnesium: 1.8 mg/dL (ref 1.7–2.4)

## 2020-07-18 MED ORDER — POTASSIUM CHLORIDE 10 MEQ/100ML IV SOLN
10.0000 meq | INTRAVENOUS | Status: AC
  Administered 2020-07-18 (×6): 10 meq via INTRAVENOUS
  Filled 2020-07-18 (×6): qty 100

## 2020-07-18 MED ORDER — TRAMADOL HCL 50 MG PO TABS
50.0000 mg | ORAL_TABLET | Freq: Four times a day (QID) | ORAL | Status: DC | PRN
  Administered 2020-07-18 – 2020-07-27 (×10): 50 mg via ORAL
  Filled 2020-07-18 (×10): qty 1

## 2020-07-18 MED ORDER — ENSURE ENLIVE PO LIQD
237.0000 mL | Freq: Two times a day (BID) | ORAL | Status: DC
  Administered 2020-07-18 – 2020-07-25 (×12): 237 mL via ORAL

## 2020-07-18 NOTE — Assessment & Plan Note (Signed)
-  Follow-up WOC

## 2020-07-18 NOTE — Assessment & Plan Note (Signed)
-  Significant decline in performance status which has been multifactorial, but regardless contributed by progressive malignancy -[place] recommended by oncology which I also would agree with given his frail state and progressive decline.  He appears to be approaching end-of-life imminently

## 2020-07-18 NOTE — Assessment & Plan Note (Signed)
-  On metoprolol and amiodarone at home -No anticoagulation due to history of falls -Amiodarone on hold in setting of prolonged QTC -Continue metoprolol

## 2020-07-18 NOTE — Assessment & Plan Note (Signed)
-  Not likely to improve and is contributing to underlying failure to thrive -Continue dysphagia 2 diet -Aspiration precautions

## 2020-07-18 NOTE — Telephone Encounter (Signed)
Please let her know that he is back in the hospital. I am okay with Palliative care there----but will likely be going to skilled nursing again after hospital

## 2020-07-18 NOTE — Hospital Course (Signed)
Adrian Singh is an 86 year old male with past medical history of CAD, systolic heart failure, paroxysmal atrial fibrillation not on anticoagulation, aspiration pneumonia, debility, type 2 diabetes on oral hypoglycemics, chronic anemia, metastatic prostate cancer to bone with a brain mass who was discharged from the hospital on 10/29. Of note, he's had hospitalizations almost monthly for the past few months.  He has continued to have a slow and progressive decline in his ability to thrive and recover/improve.  Family has been attempting to continue caring for him at home however with his ongoing decline, they are unable to continue adequately caring for him.

## 2020-07-18 NOTE — Assessment & Plan Note (Signed)
-  No obvious signs of exacerbation at this time -Start oxygen if needed, otherwise continue supportive care -Lasix on hold

## 2020-07-18 NOTE — Telephone Encounter (Signed)
Left message on VM at [facility]

## 2020-07-18 NOTE — Telephone Encounter (Signed)
Yes---I guess they tried to take him home and care for him but were unable

## 2020-07-18 NOTE — ED Notes (Signed)
Date and time results received: 07/18/20 0636 (use smartphrase ".now" to insert current time)  Test: k+ Critical Value: 2.3  Name of Provider Notified: molpus MD Orders Received? Or Actions Taken?: waiting on orders

## 2020-07-18 NOTE — Assessment & Plan Note (Signed)
-  Due to underlying prostate cancer with bone mets -Would focus more on comfort/pain control at this time -Continue morphine and lidocaine patch

## 2020-07-18 NOTE — Assessment & Plan Note (Signed)
-   severely low and downtrending further; 2.3 this am - will replete w/ 6 runs K and recheck later this pm - Mg normal

## 2020-07-18 NOTE — Progress Notes (Addendum)
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 Of note, he's had hospitalizations almost monthly for the past few months.  He has continued to have a slow and progressive decline in his ability to thrive and recover/improve.  Family has been attempting to continue caring for him at home however with his ongoing decline, they are unable to continue adequately caring for him.   Interval History:  Patient laying in bed on his right side this morning complaining of ongoing back pain.  He appears extremely frail and malnourished.  Obvious subcutaneous edema from his malnourished state. We discussed some goals of care bedside and stated that oncology and palliative care would further speak with him more as well as his family.  He appears to want to feel better however did not state that he is thinking about hospice at this time. It was unsure if he truly understands his underlying diagnosis and progressive decline.  Old records reviewed in assessment of this patient  ROS: Constitutional: positive for fatigue and malaise, Respiratory: negative, Cardiovascular: negative for chest pain and Gastrointestinal: negative for abdominal pain  Assessment & Plan: * Failure to thrive in adult -Multiple hospitalizations recently with inability to recover.  He continues to lose weight/poor nutrition.  Continues to have underlying prostate cancer which is also planning large factor in decline.  Family now unable  to continue caring for patient adequately at home -Appreciate oncology input.  Patient at this time appropriate for hospice with no recommendations to continue treatment for underlying cancer.  I have discontinued the Xtandi (which he apparently had not been taking anyways outpatient) -Palliative care consulted, appreciate assistance.  Currently a partial code (no intubation) -Follow-up PT eval -At this time, focus on comfort.  Back pain -Due to underlying prostate cancer with bone mets -Would focus more on comfort/pain control at this time -Continue morphine and lidocaine patch  Hypokalemia - severely low and downtrending further; 2.3 this am - will replete w/ 6 runs K and recheck later this pm - Mg normal  Primary malignant neoplasm of prostate metastatic to bone (HCC) -Significant decline in performance status which has been multifactorial, but regardless contributed by progressive malignancy -[place] recommended by oncology which I also would agree with given his frail state and progressive decline.  He appears to be approaching end-of-life imminently   Chronic systolic CHF (congestive heart failure) (HCC) -No obvious signs of exacerbation at this time -Start oxygen if needed, otherwise continue supportive care -Lasix on hold  Severe protein-calorie malnutrition (HCC) -Not likely to improve and is contributing to underlying failure to thrive -Continue dysphagia 2 diet -Aspiration precautions  Pressure ulcer of back -Follow-up WOC  PAF (paroxysmal atrial fibrillation) (HCC) -On metoprolol and amiodarone at home -No anticoagulation due to history of falls -Amiodarone on hold in setting of prolonged QTC -Continue metoprolol   Antimicrobials: None  DVT prophylaxis: Lovenox Code Status: Partial code, no intubation Family Communication: None present Disposition Plan: Status is: Inpatient  Remains inpatient appropriate because:Ongoing  active pain  requiring inpatient pain management, Unsafe d/c plan, IV treatments appropriate due to intensity of illness or inability to take PO and Inpatient level of care appropriate due to severity of illness   Dispo: The patient is from: Home              Anticipated d/c is to: Needs SNF or residential hospice              Anticipated d/c date is: Possibly 2 to 3 days, pending palliative care discussions with family              Patient currently is not medically stable to d/c.       Objective: Blood pressure 132/66, pulse 60, temperature 98.3 F (36.8 C), temperature source Oral, resp. rate 16, height 5\' 10"  (1.778 m), weight 75.8 kg, SpO2 97 %.  Examination: General appearance: Pleasant, frail, cachectic elderly man laying in bed appearing weak and uncomfortable but no distress Head: Normocephalic, without obvious abnormality, atraumatic Eyes: EOMI Lungs: clear to auscultation bilaterally Heart: regular rate and rhythm and S1, S2 normal Abdomen: Thin, soft, nontender, nondistended Extremities: Thin but edematous due to malnourished state Skin: mobility and turgor normal Neurologic: Grossly normal  Consultants:   Oncology  Palliative care  Procedures:   None  Data Reviewed: I have personally reviewed following labs and imaging studies Results for orders placed or performed during the hospital encounter of 07/17/20 (from the past 24 hour(s))  Basic metabolic panel     Status: Abnormal   Collection Time: 07/17/20  8:00 PM  Result Value Ref Range   Sodium 147 (H) 135 - 145 mmol/L   Potassium 2.6 (LL) 3.5 - 5.1 mmol/L   Chloride 107 98 - 111 mmol/L   CO2 27 22 - 32 mmol/L   Glucose, Bld 138 (H) 70 - 99 mg/dL   BUN 11 8 - 23 mg/dL   Creatinine, Ser 0.67 0.61 - 1.24 mg/dL   Calcium 8.1 (L) 8.9 - 10.3 mg/dL   GFR, Estimated >60 >60 mL/min   Anion gap 13 5 - 15  Magnesium     Status: None   Collection Time: 07/18/20  4:57 AM  Result Value Ref Range   Magnesium 1.8 1.7 - 2.4  mg/dL  Basic metabolic panel     Status: Abnormal   Collection Time: 07/18/20  4:57 AM  Result Value Ref Range   Sodium 143 135 - 145 mmol/L   Potassium 2.3 (LL) 3.5 - 5.1 mmol/L   Chloride 105 98 - 111 mmol/L   CO2 29 22 - 32 mmol/L   Glucose, Bld 100 (H) 70 - 99 mg/dL   BUN 11 8 - 23 mg/dL   Creatinine, Ser 0.64 0.61 - 1.24 mg/dL   Calcium 7.8 (L) 8.9 - 10.3 mg/dL   GFR, Estimated >60 >60 mL/min   Anion gap 9 5 - 15  CBC     Status: Abnormal   Collection Time: 07/18/20  4:57 AM  Result Value Ref Range   WBC 8.1 4.0 - 10.5 K/uL   RBC 3.56 (L) 4.22 - 5.81 MIL/uL   Hemoglobin 8.8 (L) 13.0 - 17.0 g/dL   HCT 29.6 (L) 39 - 52 %   MCV 83.1 80.0 - 100.0 fL   MCH 24.7 (L) 26.0 - 34.0 pg   MCHC 29.7 (L) 30.0 - 36.0 g/dL   RDW 21.2 (H) 11.5 - 15.5 %   Platelets 114 (L) 150 - 400 K/uL   nRBC 0.2  0.0 - 0.2 %    Recent Results (from the past 240 hour(s))  Culture, blood (routine x 2)     Status: None (Preliminary result)   Collection Time: 07/17/20  1:26 PM   Specimen: BLOOD  Result Value Ref Range Status   Specimen Description   Final    BLOOD BLOOD RIGHT FOREARM Performed at [facility] [address], [place], [place] [address]    Special Requests   Final    BOTTLES DRAWN AEROBIC AND ANAEROBIC Blood Culture adequate volume Performed at [facility] [address], [place], [place] [address]    Culture   Final    NO GROWTH < 24 HOURS Performed at [facility] [address], [place], [place] [address]    Report Status PENDING  Incomplete  Culture, blood (routine x 2)     Status: None (Preliminary result)   Collection Time: 07/17/20  1:31 PM   Specimen: BLOOD  Result Value Ref Range Status   Specimen Description   Final    BLOOD LEFT ANTECUBITAL Performed at [facility] [address] [place], [place] [address]    Special Requests   Final    BOTTLES DRAWN AEROBIC AND ANAEROBIC Blood Culture adequate  volume Performed at [facility] [address], [place], [place] [address]    Culture   Final    NO GROWTH < 24 HOURS Performed at [facility] [address] [place], [place] [address]    Report Status PENDING  Incomplete  Respiratory Panel by RT PCR (Flu A&B, Covid) - Nasopharyngeal Swab     Status: None   Collection Time: 07/17/20  1:55 PM   Specimen: Nasopharyngeal Swab  Result Value Ref Range Status   SARS Coronavirus 2 by RT PCR NEGATIVE NEGATIVE Final    Comment: (NOTE) SARS-CoV-2 target nucleic acids are NOT DETECTED.  The SARS-CoV-2 RNA is generally detectable in upper respiratoy specimens during the acute phase of infection. The lowest concentration of SARS-CoV-2 viral copies this assay can detect is 131 copies/mL. A negative result does not preclude SARS-Cov-2 infection and should not be used as the sole basis for treatment or other patient management decisions. A negative result may occur with  improper specimen collection/handling, submission of specimen other than nasopharyngeal swab, presence of viral mutation(s) within the areas targeted by this assay, and inadequate number of viral copies (<131 copies/mL). A negative result must be combined with clinical observations, patient history, and epidemiological information. The expected result is Negative.  Fact Sheet for Patients:  PinkCheek.be  Fact Sheet for Healthcare Providers:  GravelBags.it  This test is no t yet approved or cleared by the [place] FDA and  has been authorized for detection and/or diagnosis of SARS-CoV-2 by FDA under an Emergency Use Authorization (EUA). This EUA will remain  in effect (meaning this test can be used) for the duration of the COVID-19 declaration under Section 564(b)(1) of the Act, 21 U.S.C. section 360bbb-3(b)(1), unless the authorization is terminated or revoked sooner.     Influenza  A by PCR NEGATIVE NEGATIVE Final   Influenza B by PCR NEGATIVE NEGATIVE Final    Comment: (NOTE) The Xpert Xpress SARS-CoV-2/FLU/RSV assay is intended as an aid in  the diagnosis of influenza from Nasopharyngeal swab specimens and  should not be used as a sole basis for treatment. Nasal washings and  aspirates are unacceptable for Xpert Xpress SARS-CoV-2/FLU/RSV  testing.  Fact  Sheet for Patients: PinkCheek.be  Fact Sheet for Healthcare Providers: GravelBags.it  This test is not yet approved or cleared by the [place] FDA and  has been authorized for detection and/or diagnosis of SARS-CoV-2 by  FDA under an Emergency Use Authorization (EUA). This EUA will remain  in effect (meaning this test can be used) for the duration of the  Covid-19 declaration under Section 564(b)(1) of the Act, 21  U.S.C. section 360bbb-3(b)(1), unless the authorization is  terminated or revoked. Performed at [facility], [place] [address], [place], [place] [address]      Radiology Studies: DG Chest Port 1 View  Result Date: 07/17/2020 CLINICAL DATA:  Cough EXAM: PORTABLE CHEST 1 VIEW COMPARISON:  Chest radiograph dated 07/09/2020 FINDINGS: The heart size and mediastinal contours are within normal limits. Both lungs are clear. The visualized skeletal structures are unremarkable. IMPRESSION: No active disease. Electronically Signed   By: Zerita Boers M.D.   On: 07/17/2020 14:07   DG Chest Port 1 View  Final Result      Scheduled Meds: . enoxaparin (LOVENOX) injection  40 mg Subcutaneous Q24H  . feeding supplement  237 mL Oral BID BM  . lidocaine  1 patch Transdermal Q24H  . metoprolol tartrate  50 mg Oral BID  . polyethylene glycol  17 g Oral Daily   PRN Meds: acetaminophen **OR** acetaminophen, docusate sodium, morphine injection, nitroGLYCERIN Continuous Infusions:    LOS: 1 day  Time spent: Greater than 50% of  the 35 minute visit was spent in counseling/coordination of care for the patient as laid out in the A&P.   Dwyane Dee, MD [facility] 07/18/2020, 3:54 PM

## 2020-07-18 NOTE — Telephone Encounter (Signed)
Patient is currently admitted at the hospital.

## 2020-07-18 NOTE — Telephone Encounter (Signed)
Patient Name: Adrian Singh Gender: Male DOB: 1934/04/20 Age: 86 Y 11 M 14 D Return Phone Number: [phone] (Primary), [phone] (Secondary) Address: City/State/Zip: [place] [address] Client [facility] Night - Client Client Site [place] Physician Viviana Simpler- MD Contact Type Call Who Is Calling Patient / Member / Family / Caregiver Call Type Triage / Clinical Caller Name Morene Rankins Relationship To Patient Daughter Return Phone Number [phone] (Primary) Chief Complaint Swallowing Difficulty Reason for Call Symptomatic / Request for [place] states her father got released from the hospital on Friday. Caller states prior to his release he had pneumonia and he has developed bed sores. Caller states she is not concerned about his bed sores getting infected. Caller states the paramedics are at his house and they advised that she call his pcp to have him directly admitted. Caller states he has cough, congestion, difficulty swallowing, and one of his bed sores has opened. Caller states he is not mobile at all and he is in pain and discomfort. Translation No No Triage Reason Other Nurse Assessment Nurse: Guadlupe Spanish, RN, Otila Kluver Date/Time (Eastern Time): 07/17/2020 12:14:42 PM Confirm and document reason for call. If symptomatic, describe symptoms. ---Caller states her father got released from the hospital on Friday. Caller states prior to his release he had pneumonia and he has developed bed sores. Caller states she is not concerned about his bed sores getting infected. Caller states the paramedics are at his house and they advised that she call his pcp to have him directly admitted. Caller states he has cough, congestion, difficulty swallowing, and one of his bed sores has opened. Caller states he is not mobile at all and he is in pain and discomfort. Does the patient have any new  or worsening symptoms? ---Yes Will a triage be completed? ---No [place] reason for no triage. ---Other Please document clinical information provided and list any resource used. ---Triage deferred due to patient is already being evaluated by EMS and reason for call is daughter is requesting a direct admit for her father via PCP office (on call). Disp. Time Eilene Ghazi Time) Disposition Final User 07/17/2020 12:14:04 PM Paged On Call back to [facility], [place], Otila Kluver 07/17/2020 12:16:58 PM Paged On Call back to Call [place], RN, Otila Kluver PLEASE NOTE: All timestamps contained within this report are represented as [place] Standard Time. CONFIDENTIALTY NOTICE: This fax transmission is intended only for the addressee. It contains information that is legally privileged, confidential or otherwise protected from use or disclosure. If you are not the intended recipient, you are strictly prohibited from reviewing, disclosing, copying using or disseminating any of this information or taking any action in reliance on or regarding this information. If you have received this fax in error, please notify us immediately by telephone so that we can arrange for its return to [place]. Phone: [phone], Toll-Free: [phone], Fax: [phone] Page: 2 of 2 Call Id: [id number] [place]. Time Eilene Ghazi Time) Disposition Final User 07/17/2020 12:40:41 PM Paged On Call back to [facility], [place], Otila Kluver 07/17/2020 12:16:28 PM Clinical Call Yes Guadlupe Spanish, RN, Otila Kluver Comments User: Alric Quan, RN Date/Time Eilene Ghazi Time): 07/17/2020 12:58:53 PM Spoke with caller and advised per on call that he is unable to direct admit. Verbalized understanding stating that EMS has already transported her father to the hospital and that palliative services have not been started as of yet. Paging DoctorName Phone DateTime Result/Outcome Message Type Notes Ria Bush -  MD [phone] 07/17/2020 12:14:03 PM Paged On Call Back  to Call Center Doctor Paged Caller states her father got released from the hospital on Friday where he was treated for pneumonia and he developed bed sores. Caller states she is now concerned about his bed sores getting infected. Caller states the paramedics are at his house and they advised that she call his pcp to have him directly admitted due to extremely long ED wait times upwards of 12 hours. Caller states current sx are cough, congestion, difficulty swallowing, and one of his bed sores has opened. Tina RN Ria Bush - MD [phone] 07/17/2020 12:40:41 PM Paged On Call Back to Call Center Doctor Paged Caller is patients daughter who is calling to report that she called EMS for her father and they suggested she call PCP to request a direct admit. She states her 86 yo father was recently hospitalized (D/C on Friday) for pneumonia and he developed bed sores. Caller states she is now concerned about his bed sores getting infected. Caller states current sx are cough, congestion, difficulty swallowing, and one of his bed sores has opened. EMS call was made due to severe pain. Otila Kluver RN Ria Bush - MD 07/17/2020 12:57:47 PM Spoke with On Call - [place] with on call stated that he is unable to direct admit a patient he has not seen and has no access to provide orders for that. Suggested to have caller contact palliative care if services in place.

## 2020-07-18 NOTE — Assessment & Plan Note (Signed)
-  Multiple hospitalizations recently with inability to recover.  He continues to lose weight/poor nutrition.  Continues to have underlying prostate cancer which is also planning large factor in decline.  Family now unable to continue caring for patient adequately at home -Appreciate oncology input.  Patient at this time appropriate for hospice with no recommendations to continue treatment for underlying cancer.  I have discontinued the Xtandi (which he apparently had not been taking anyways outpatient) -Palliative care consulted, appreciate assistance.  Currently a partial code (no intubation) -Follow-up PT eval -At this time, focus on comfort.

## 2020-07-18 NOTE — Progress Notes (Signed)
IP PROGRESS NOTE  Subjective:   I was asked by Dr. Sabino Gasser to comment on Mr. Delapena case.  He is a 86 year old man with a history of advanced prostate cancer with disease to the bone diagnosed in 2018.  At that time his PSA was 158 with a Gleason score 4+ 5 = 9 and bone disease involvement.  He was treated with androgen deprivation under the care of Dr. Diona Fanti and developed castration-resistant disease.  He subsequently developed a castration resistant disease in 2020 and was started on Zytiga.  He tolerated that poorly and I saw him last visit in June 2020.  At that time his PSA was 1.2.  He had has been following up and being treated with [facility]. D[facility] since that time.  At some point he was started on Xtandi.  During the last year he has been experiencing significant decline and failure to thrive.  He was evaluated by Dr. Silvio Pate in August 2021 and at that time his PSA was 217 on Xtandi.  He has been hospitalized multiple times and Gillermina Phy has been withheld for the last 3 months.  He was recently discharged on October 29 after hospitalization with paroxysmal atrial fibrillation as well as pneumonia and overall weakness.  He was hospitalized again for failure to thrive and increased cough on October 31.  Clinically, he appears to be quite debilitated and reports overall weakness.  The reports he has not had any cancer treatment medication last months because of his recurrent hospitalization.  He denies any worsening bone pain.  He is eating very little mobility is very limited.   Objective:  Vital signs in last 24 hours: Temp:  [97.1 F (36.2 C)-97.8 F (36.6 C)] 97.7 F (36.5 C) (11/01 0807) Pulse Rate:  [39-71] 68 (11/01 0807) Resp:  [12-19] 16 (11/01 0807) BP: (123-180)/(48-89) 163/65 (11/01 0807) SpO2:  [96 %-100 %] 100 % (11/01 0807) Weight:  [167 lb (75.8 kg)] 167 lb (75.8 kg) (10/31 1449) Weight change:     Intake/Output from previous day: 10/31 0701 - 11/01  0700 In: 100 [IV Piggyback:100] Out: 600 [Urine:600] General: chronically ill-appearing although without distress. Head: Normocephalic atraumatic. Mouth: mucous membranes moist, pharynx normal without lesions Eyes: No scleral icterus.  Pupils are equal and round reactive to light. Resp: clear to auscultation bilaterally without rhonchi or wheezes or dullness to percussion. Cardio: regular rate and rhythm, S1, S2 normal, no murmur, click, rub or gallop GI: soft, non-tender; bowel sounds normal; no masses,  no organomegaly Musculoskeletal: No joint deformity or effusion. Neurological: No motor, sensory deficits.  Intact deep tendon reflexes. Skin:ecchymosis noted on upper extremities.  Lab Results: Recent Labs    07/17/20 1355 07/18/20 0457  WBC 9.0 8.1  HGB 8.7* 8.8*  HCT 29.5* 29.6*  PLT 125* 114*    BMET Recent Labs    07/17/20 2000 07/18/20 0457  NA 147* 143  K 2.6* 2.3*  CL 107 105  CO2 27 29  GLUCOSE 138* 100*  BUN 11 11  CREATININE 0.67 0.64  CALCIUM 8.1* 7.8*    Studies/Results: DG Chest Port 1 View  Result Date: 07/17/2020 CLINICAL DATA:  Cough EXAM: PORTABLE CHEST 1 VIEW COMPARISON:  Chest radiograph dated 07/09/2020 FINDINGS: The heart size and mediastinal contours are within normal limits. Both lungs are clear. The visualized skeletal structures are unremarkable. IMPRESSION: No active disease. Electronically Signed   By: Zerita Boers M.D.   On: 07/17/2020 14:07    Medications: I have reviewed the  patient's current medications.  Assessment/Plan:  86 year old with:  1.  Advanced prostate cancer diagnosed in 2018 with disease to the bone.  He was treated with androgen deprivation therapy and subsequently with Zytiga and most recently with Xtandi.  He has not taken Xtandi for the last 2-1/2 months per his report.  His last PSA on April 29, 2020 was 217.   The natural course of this disease was discussed today and appears it has been worsening based on  his PSA readings and overall clinical decline.  CT scan on July 05, 2020 did not show any evidence of visceral metastasis but continues to have bone disease.  I believe that his prostate cancer is progressing but it is not the sole contributing factor to his overall decline.  Given his age and multiple comorbidities is a multifactorial reason why he is declining in the setting of an incurable progressive malignancy.  Based on these findings, I do not recommend any additional anticancer treatment.  I believe his performance status is very poor and unlikely to improve.  I think we are looking at a very limited life expectancy due to combination of factors including progressive malignancy, poor performance status and other comorbid conditions.  I recommend proceeding with hospice care only at this time and discontinue all cancer treatment.  He is currently not receiving any at this point.  2.  Intracranial process: Was evaluated by Dr. Mickeal Skinner with a low suspicious for malignancy.  I think it has very limited bearing on his prognosis at this time given his overall poor prognosis anyways.  Please call with any questions regarding this gentleman.  35  minutes were dedicated to this visit.  More than 50% of time was face-to-face and the time was spent on reviewing laboratory data, imaging studies, discussing overall prognosis and answering questions regarding future plan.     LOS: 1 day   Zola Button 07/18/2020, 8:35 AM

## 2020-07-18 NOTE — Consult Note (Signed)
Consultation Note Date: 07/18/2020   Patient Name: Adrian Singh  DOB: March 04, 1934  MRN: [id number]  Age / Sex: 86 y.o., male  PCP: Adrian Carbon, MD Referring Physician: Dwyane Dee, MD  Reason for Consultation: Establishing goals of care and Psychosocial/spiritual support  HPI/Patient Profile: 86 y.o. male  admitted on 07/17/2020 with  past medical history of CAD, systolic heart failure, paroxysmal atrial fibrillation not on anticoagulation, aspiration pneumonia, debility, type 2 diabetes on oral hypoglycemics, chronic anemia, metastatic prostate cancer to bone, brain mass who was discharged from the hospital only 2 days ago (10/29) after treatment for HCAP and A. fib with RVR who presents to the ED with cough and failure to thrive since discharge.   PMT team met with patient and family multiple times on last admission for detailed conversation regarding patient's overall failure to thrive and long-term poor prognosis.  Had limited transition of care options at time of discharge last week.  Family opted to take the patient home and try to care for him at home however it was too overwhelming for his elderly wife, he continued to have poor p.o. intake and declining both physically and functionally.  Family brought him back to the emergency room for evaluation and treatment.    Patient and family once again face treatment option decisions, advanced directive decisions and anticipatory care needs.   Clinical Assessment and Goals of Care:   This NP Adrian Singh reviewed medical records, received report from team, assessed the patient and then meet at the patient's bedside  to discuss diagnosis, prognosis, GOC, EOL wishes disposition and options.  I then spoke to daughter/Adrian Singh by phone   Again concept of Palliative Care was introduced as specialized medical care for people and their families living  with serious illness.  If focuses on providing relief from the symptoms and stress of a serious illness.  The goal is to improve quality of life for both the patient and the family.  Created space and opportunity for patient  and family to explore thoughts and feelings regarding current medical information.  Patient himself verbalizes an understanding that he continues to decline, he verbalizes "I cannot keep doing this".  However his biggest concern and worry is his wife who he tells me is ill herself and not seeking medical attention.  In speaking with patient's daughter/Adrian Singh she verbalizes an understanding that her dad is weak and cannot be cared for in the home.  She also verbalizes that her mom agrees to have some medical attention herself.  I raised awareness again to the importance of advanced care planning.     Concepts specific to code status, artifical feeding and hydration, continued IV antibiotics and rehospitalization was had.  The difference between a aggressive medical intervention path  and a palliative comfort care path for this patient at this time was had.    Values and goals of care important to patient and family were attempted to be elicited.   Education offered on the importance of clarification of goals of  care before transition of care whether it back home or to a facility.  I worry that without clear documentation of goals of care patient will continue to bounce back and forth between facility and hospital, causing him continued distress.  Education offered and hospice benefit detailed specifically to hospice benefit at home versus hospice benefit at a skilled nursing facility versus residential hospice.   Questions and concerns addressed.  Patient  encouraged to call with questions or concerns.     PMT will continue to support holistically.     There is no documented H POA.  Patient's wife and their 3 children work together and unison for the patient's best  interest.  At this time patient has his own medical decisional capacity.    SUMMARY OF RECOMMENDATIONS    Code Status/Advance Care Planning:  Limited code  Encouraged patient/family to consider DNR/DNI status understanding evidenced based poor outcomes in similar hospitalized patient, as the cause of arrest is likely associated with advanced chronic illness rather than an easily reversible acute cardio-pulmonary event.   Palliative Prophylaxis:   Aspiration, Bowel Regimen, Delirium Protocol, Frequent Pain Assessment and Oral Care  Additional Recommendations (Limitations, Scope, Preferences):  Full Scope Treatment  Psycho-social/Spiritual:   Desire for further Chaplaincy support:no  Additional Recommendations: Education on Hospice  Prognosis:   < 6 months  Discharge Planning: Both patient and [name] agree that appropriate care cannot be provided at home. Adrian Singh mentions the possibility of a skilled nursing facility for short-term rehab.    To Be Determined      Primary Diagnoses: Present on Admission: . Failure to thrive in adult . Primary malignant neoplasm of prostate metastatic to bone ([place]) . Severe protein-calorie malnutrition ([place]) . PAF (paroxysmal atrial fibrillation) ([place]) . Prostate cancer [facility])   I have reviewed the medical record, interviewed the patient and family, and examined the patient. The following aspects are pertinent.  Past Medical History:  Diagnosis Date  . Arthritis    "legs, back" (10/06/2015)  . Atrial fibrillation ([place])    x1  . Atrial fibrillation ([place])   . CAD (coronary artery disease)    nonobstructive  . Cancer ([place])    skin cancer on ear (froze it off) and back (cut it off)  . Dysrhythmia   . Esophageal reflux    hx of  . History of kidney stones   . Hx of heart artery stent   . Hypertrophy of prostate without urinary obstruction and other lower urinary tract symptoms (LUTS)   . Osteoarthrosis, unspecified  whether generalized or localized, unspecified site   . Prostate cancer ([place])   . Skin cancer   . Stroke [facility]) 03/2014   "had a series of mini strokes; maybe 4"; denies residual on 10/06/2015  . Thrombocytopenia ([place])   . Type II diabetes mellitus ([place])    type 2  . Unspecified essential hypertension    Social History   Socioeconomic History  . Marital status: Married    Spouse name: Not on file  . Number of children: 4  . Years of education: Not on file  . Highest education level: Not on file  Occupational History  . Occupation: Radiographer, 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[employer]    Employer: RETIRED  Tobacco Use  . Smoking status: Former Smoker    Years: 3.00    Types: Cigarettes  . Smokeless tobacco: Never Used  . Tobacco comment: " Quit smoking by age 60; was a someday smoker "  Vaping Use  .  Vaping Use: Never used  Substance and Sexual Activity  . Alcohol use: No  . Drug use: No  . Sexual activity: Never  Other Topics Concern  . Not on file  Social History Narrative   No living will   Plans wife and then children to make health care decisions for him if unable   Would request at least attempts at resuscitation but no prolonged life support   Doesn't think he would want tube feeds if cognitively unaware   Social Determinants of Health   Financial Resource Strain:   . Difficulty of Paying Living Expenses: Not on file  Food Insecurity:   . Worried About Charity fundraiser in the Last Year: Not on file  . Ran Out of Food in the Last Year: Not on file  Transportation Needs:   . Lack of Transportation (Medical): Not on file  . Lack of Transportation (Non-Medical): Not on file  Physical Activity:   . Days of Exercise per Week: Not on file  . Minutes of Exercise per Session: Not on file  Stress:   . Feeling of Stress : Not on file  Social Connections:   . Frequency of Communication with Friends and Family: Not on file  . Frequency of Social Gatherings with Friends and Family: Not on  file  . Attends Religious Services: Not on file  . Active Member of Clubs or Organizations: Not on file  . Attends Archivist Meetings: Not on file  . Marital Status: Not on file   Family History  Problem Relation Age of Onset  . Stroke Father   . Peripheral vascular disease Father        amputation  . Heart failure Mother        CHF  . Coronary artery disease Mother   . Heart attack Mother 27       Multiple  . COPD Brother   . Heart disease Brother   . Cancer Brother        Bone  . Lung cancer Sister    Scheduled Meds: . atorvastatin  20 mg Oral Daily  . calcium carbonate  500 mg of elemental calcium Oral Daily  . enoxaparin (LOVENOX) injection  40 mg Subcutaneous Q24H  . enzalutamide  80 mg Oral Daily  . feeding supplement  237 mL Oral BID BM  . lidocaine  1 patch Transdermal Q24H  . metoprolol tartrate  50 mg Oral BID  . polyethylene glycol  17 g Oral Daily  . [START ON 07/23/2020] Vitamin D (Ergocalciferol)  50,000 Units Oral Q7 days   Continuous Infusions: . potassium chloride 10 mEq (07/18/20 1110)   PRN Meds:.acetaminophen **OR** acetaminophen, docusate sodium, morphine injection, nitroGLYCERIN Medications Prior to Admission:  Prior to Admission medications   Medication Sig Start Date End Date Taking? Authorizing Provider  acetaminophen (TYLENOL) 325 MG tablet Take 650 mg by mouth every 12 (twelve) hours as needed for mild pain or moderate pain.   Yes [provider]  amiodarone (PACERONE) 200 MG tablet Take 1 tablet (200 mg total) by mouth 2 (two) times daily. Patient taking differently: Take 200 mg by mouth in the morning, at noon, and at bedtime.  07/14/20  Yes Barb Merino, MD  Ascorbic Acid (VITAMIN C) 1000 MG tablet Take 1,000 mg by mouth daily.   Yes [provider]  atorvastatin (LIPITOR) 40 MG tablet Take 20 mg by mouth daily.   Yes [provider]  B Complex Vitamins (VITAMIN B  COMPLEX PO) Take 1 tablet by mouth  daily.   Yes [provider]  calcium carbonate (CALCIUM 600) 1500 (600 Ca) MG TABS tablet Take 600 mg of elemental calcium by mouth daily.    Yes [provider]  cyanocobalamin 1000 MCG tablet Take 1 tablet (1,000 mcg total) by mouth at bedtime. 07/14/20  Yes Barb Merino, MD  Ensure Plus (ENSURE PLUS) LIQD Take 237 mLs by mouth daily as needed (suppliment).   Yes [provider]  ergocalciferol (VITAMIN D2) 1.25 MG (50000 UT) capsule Take 50,000 Units by mouth once a week. q Saturday   Yes [provider]  furosemide (LASIX) 40 MG tablet Take 40 mg by mouth daily as needed for fluid.    Yes [provider]  glimepiride (AMARYL) 2 MG tablet Take 1 tablet (2 mg total) by mouth every morning. Patient taking differently: Take 2 mg by mouth daily.  06/13/20 06/13/21 Yes Georgette Shell, MD  metoprolol tartrate (LOPRESSOR) 50 MG tablet Take 1 tablet (50 mg total) by mouth in the morning, at noon, and at bedtime. 07/15/20 07/15/21 Yes Barb Merino, MD  Multiple Vitamin (MULTIVITAMIN WITH MINERALS) TABS tablet Take 1 tablet by mouth daily.    Yes [provider]  nitroGLYCERIN (NITROSTAT) 0.4 MG SL tablet Place 1 tablet (0.4 mg total) under the tongue every 5 (five) minutes as needed for chest pain. Patient taking differently: Place 0.4 mg under the tongue every 5 (five) minutes as needed for chest pain. If no relief, call MD 05/15/18  Yes Adrian Carbon, MD  polyethylene glycol (MIRALAX / GLYCOLAX) 17 g packet Take 17 g by mouth daily as needed for mild constipation.    Yes [provider]  traMADol (ULTRAM) 50 MG tablet Take 1 tablet (50 mg total) by mouth 3 (three) times daily as needed. Patient taking differently: Take 50 mg by mouth 3 (three) times daily as needed for moderate pain.  06/13/20  Yes Georgette Shell, MD  BAYER MICROLET LANCETS lancets Use to test blood sugar once daily dx: E11.40 10/18/15   Adrian Carbon, MD    Blood Glucose Monitoring Suppl (CONTOUR NEXT EZ MONITOR) w/Device KIT USE TO TEST BLOOD SUGAR ONCE DAILY. Dx Code E11.40 06/13/16   Viviana Simpler I, MD  glucose blood (CONTOUR NEXT TEST) test strip Check blood sugar once daily and as directed.Dx Code E11.40 03/19/19   Adrian Carbon, MD   Allergies  Allergen Reactions  . Soma [Carisoprodol] Other (See Comments)    "did a number on me"  . Doxazosin Other (See Comments)    dizziness   Review of Systems  Neurological: Positive for weakness.    Physical Exam Constitutional:      Appearance: He is cachectic. He is ill-appearing.  Cardiovascular:     Rate and Rhythm: Normal rate.  Pulmonary:     Breath sounds: Decreased air movement present.  Musculoskeletal:     Comments: generalized weakness and muscle atrophy  Neurological:     Mental Status: He is alert.     Vital Signs: BP (!) 163/65 (BP Location: Right Arm)   Pulse 68   Temp 97.7 F (36.5 C) (Oral)   Resp 16   Ht 5' 10" (1.778 m)   Wt 75.8 kg   SpO2 100%   BMI 23.96 kg/m  Pain Scale: 0-10   Pain Score: 6    SpO2: SpO2: 100 % O2 Device:SpO2: 100 % O2 Flow Rate: .   IO:  Intake/output summary:   Intake/Output Summary (Last 24 hours) at 07/18/2020 1126 Last data filed at 07/18/2020 0417 Gross per 24 hour  Intake 100 ml  Output 600 ml  Net -500 ml    LBM:   Baseline Weight: Weight: 75.8 kg Most recent weight: Weight: 75.8 kg     Palliative Assessment/Data: 30 % at best   Discussed with Dr Sabino Gasser via secure chat  Time In: 0800 Time Out: 0915 Time Total: 75 minutes Greater than 50%  of this time was spent counseling and coordinating care related to the above assessment and plan.  Signed by: Adrian Lessen, NP   Please contact Palliative Medicine Team phone at [phone] for questions and concerns.  For individual provider: See Shea Evans

## 2020-07-18 NOTE — Progress Notes (Signed)
Initial Nutrition Assessment  DOCUMENTATION CODES:   Severe malnutrition in context of chronic illness  INTERVENTION:   -Ensure Enlive po BID, each supplement provides 350 kcal and 20 grams of protein -Magic cup BID with meals, each supplement provides 290 kcal and 9 grams of protein (automatically with dysphagia 2 diet)  NUTRITION DIAGNOSIS:   Severe Malnutrition related to chronic illness, cancer and cancer related treatments as evidenced by severe fat depletion, severe muscle depletion.  GOAL:   Patient will meet greater than or equal to 90% of their needs  MONITOR:   PO intake, Supplement acceptance, Labs, Weight trends, I & O's, Skin, GOC  REASON FOR ASSESSMENT:   Consult Assessment of nutrition requirement/status  ASSESSMENT:   86 year old male with past medical history of CAD, systolic heart failure, paroxysmal atrial fibrillation not on anticoagulation, aspiration pneumonia, debility, type 2 diabetes on oral hypoglycemics, chronic anemia, metastatic prostate cancer to bone, brain mass who was discharged from the hospital only 2 days ago (10/29) after treatment for HCAP and A. fib with RVR who presents to the ED with cough and failure to thrive since discharge.  Patient just discharged from hospital on 10/29. Pt was provided Ensure supplements then given severe malnutrition. Per oncology note, pt has been off cancer treatments for months.  PO has decreased with poor appetite. Pt is currently bedbound.   Pt consumed 65% of breakfast this morning. Will continue Ensure and Magic Cup supplements given poor PO.  Noted palliative care consulted for [place].  Per weight records, pt has lost 18 lbs since 2/8 (9% wt loss x 8.5 months, insignificant for time frame). Pt still meets criteria for severe malnutrition given depletions and continued poor PO.   Medications: OSCAL, Miralax, KCl  Labs reviewed:  Low K  NUTRITION - FOCUSED PHYSICAL EXAM:    Most Recent Value  Orbital  Region Severe depletion  Upper Arm Region Moderate depletion  Thoracic and Lumbar Region Moderate depletion  Buccal Region Severe depletion  Temple Region Severe depletion  Clavicle Bone Region Severe depletion  Clavicle and Acromion Bone Region Severe depletion  Scapular Bone Region Unable to assess  Dorsal Hand Moderate depletion  Patellar Region Severe depletion  Anterior Thigh Region Severe depletion  Posterior Calf Region Severe depletion  Edema (RD Assessment) None       Diet Order:   Diet Order            DIET DYS 2 Room service appropriate? Yes; Fluid consistency: Thin  Diet effective now                 EDUCATION NEEDS:   No education needs have been identified at this time  Skin:  Skin Integrity Issues:: Stage II Stage II: mid back  Last BM:  10/27  Height:   Ht Readings from Last 1 Encounters:  07/17/20 5\' 10"  (1.778 m)    Weight:   Wt Readings from Last 1 Encounters:  07/17/20 75.8 kg    BMI:  Body mass index is 23.96 kg/m.  Estimated Nutritional Needs:   Kcal:  2050-2250  Protein:  100-115g  Fluid:  2L/day   Clayton Bibles, MS, RD, LDN Inpatient Clinical Dietitian Contact information available via Amion

## 2020-07-18 NOTE — Telephone Encounter (Signed)
Denise with [facility] left v/m that pt was discharged this past weekend from [facility] which recommended pt have palliative care in the home to go along with home health services. Denise with [facility] wants to know if Dr Silvio Pate will give order for palliative care in the home.

## 2020-07-19 DIAGNOSIS — M545 Low back pain, unspecified: Secondary | ICD-10-CM

## 2020-07-19 DIAGNOSIS — E876 Hypokalemia: Principal | ICD-10-CM

## 2020-07-19 LAB — COMPREHENSIVE METABOLIC PANEL
ALT: 12 U/L (ref 0–44)
AST: 42 U/L — ABNORMAL HIGH (ref 15–41)
Albumin: 2 g/dL — ABNORMAL LOW (ref 3.5–5.0)
Alkaline Phosphatase: 553 U/L — ABNORMAL HIGH (ref 38–126)
Anion gap: 9 (ref 5–15)
BUN: 10 mg/dL (ref 8–23)
CO2: 29 mmol/L (ref 22–32)
Calcium: 7.4 mg/dL — ABNORMAL LOW (ref 8.9–10.3)
Chloride: 100 mmol/L (ref 98–111)
Creatinine, Ser: 0.62 mg/dL (ref 0.61–1.24)
GFR, Estimated: >60 mL/min (ref >60)
Glucose, Bld: 90 mg/dL (ref 70–99)
Potassium: 2.3 mmol/L — CL (ref 3.5–5.1)
Sodium: 138 mmol/L (ref 135–145)
Total Bilirubin: 0.9 mg/dL (ref 0.3–1.2)
Total Protein: 5.9 g/dL — ABNORMAL LOW (ref 6.5–8.1)

## 2020-07-19 LAB — CBC WITH DIFFERENTIAL/PLATELET
Abs Immature Granulocytes: 0.5 10*3/uL — ABNORMAL HIGH (ref 0.00–0.07)
Band Neutrophils: 4 %
Basophils Absolute: 0 10*3/uL (ref 0.0–0.1)
Basophils Relative: 0 %
Eosinophils Absolute: 0 10*3/uL (ref 0.0–0.5)
Eosinophils Relative: 0 %
HCT: 27.1 % — ABNORMAL LOW (ref 39.0–52.0)
Hemoglobin: 8 g/dL — ABNORMAL LOW (ref 13.0–17.0)
Lymphocytes Relative: 18 %
Lymphs Abs: 1.3 10*3/uL (ref 0.7–4.0)
MCH: 24.2 pg — ABNORMAL LOW (ref 26.0–34.0)
MCHC: 29.5 g/dL — ABNORMAL LOW (ref 30.0–36.0)
MCV: 82.1 fL (ref 80.0–100.0)
Metamyelocytes Relative: 2 %
Monocytes Absolute: 0.4 10*3/uL (ref 0.1–1.0)
Monocytes Relative: 5 %
Myelocytes: 5 %
Neutro Abs: 5.2 10*3/uL (ref 1.7–7.7)
Neutrophils Relative %: 66 %
Platelets: 101 10*3/uL — ABNORMAL LOW (ref 150–400)
RBC: 3.3 MIL/uL — ABNORMAL LOW (ref 4.22–5.81)
RDW: 21 % — ABNORMAL HIGH (ref 11.5–15.5)
WBC: 7.4 10*3/uL (ref 4.0–10.5)
nRBC: 0.4 % — ABNORMAL HIGH (ref 0.0–0.2)

## 2020-07-19 LAB — PHOSPHORUS: Phosphorus: 2.6 mg/dL (ref 2.5–4.6)

## 2020-07-19 LAB — TROPONIN I (HIGH SENSITIVITY)
Troponin I (High Sensitivity): 44 ng/L — ABNORMAL HIGH (ref <18)
Troponin I (High Sensitivity): 39 ng/L — ABNORMAL HIGH (ref <18)

## 2020-07-19 LAB — MAGNESIUM: Magnesium: 1.7 mg/dL (ref 1.7–2.4)

## 2020-07-19 MED ORDER — POTASSIUM CHLORIDE CRYS ER 20 MEQ PO TBCR
40.0000 meq | EXTENDED_RELEASE_TABLET | Freq: Once | ORAL | Status: AC
  Administered 2020-07-19: 40 meq via ORAL
  Filled 2020-07-19: qty 2

## 2020-07-19 MED ORDER — POTASSIUM CHLORIDE 10 MEQ/100ML IV SOLN
10.0000 meq | INTRAVENOUS | Status: AC
  Administered 2020-07-19 (×3): 10 meq via INTRAVENOUS
  Filled 2020-07-19 (×3): qty 100

## 2020-07-19 MED ORDER — MAGNESIUM SULFATE 50 % IJ SOLN: 2.0000 g | Freq: Once | INTRAMUSCULAR | Status: DC

## 2020-07-19 MED ORDER — MAGNESIUM SULFATE 2 GM/50ML IV SOLN
2.0000 g | Freq: Once | INTRAVENOUS | Status: AC
  Administered 2020-07-19: 2 g via INTRAVENOUS
  Filled 2020-07-19: qty 50

## 2020-07-19 NOTE — Progress Notes (Signed)
PROGRESS NOTE    Adrian Singh  WLN:[id number] DOB: [birth year] DOA: 07/17/2020 PCP: [name], MD     Brief Narrative:  Adrian Singh is an 86 year old WM PMHx CAD, systolic heart failure, paroxysmal atrial fibrillation not on anticoagulation, aspiration pneumonia, debility, type 2 diabetes on oral hypoglycemics, chronic anemia, metastatic prostate cancer to bone with a brain mass who was discharged from the hospital on 10/29.   Of note, he's had hospitalizations almost monthly for the past few months.  He has continued to have a slow and progressive decline in his ability to thrive and recover/improve.  Family has been attempting to continue caring for him at home however with his ongoing decline, they are unable to continue adequately caring for him.  Patient laying in bed on his right side this morning complaining of ongoing back pain.  He appears extremely frail and malnourished.  Obvious subcutaneous edema from his malnourished state. We discussed some goals of care bedside and stated that oncology and palliative care would further speak with him more as well as his family.  He appears to want to feel better however did not state that he is thinking about hospice at this time. It was unsure if he truly understands his underlying diagnosis and progressive decline.  Old records reviewed in assessment of this patient  Subjective: Afebrile overnight, A/O x4, states has not been out of the bed without help in the last month or so.  Adrian Singh states whenever he attempts to stand up become hypotensive requires 2 person assist.   Assessment & Plan: Covid vaccination;   Principal Problem:   Failure to thrive in adult Active Problems:   PAF (paroxysmal atrial fibrillation) (HCC)   Prostate cancer ([place])   Primary malignant neoplasm of prostate metastatic to bone (HCC)   General weakness   Pressure ulcer of back   Severe protein-calorie malnutrition (HCC)   Hypokalemia   Back  pain   Prolonged QT interval   Chronic systolic CHF (congestive heart failure) ([place])  Primary malignant neoplasm of prostate to bone -Advanced prostate cancer diagnosed in 2018 with disease to the bone.  He was treated with androgen deprivation therapy and subsequently with Zytiga and most recently with Xtandi.  He has not taken Xtandi for the last 2-1/2 months per his report.  His last PSA on April 29, 2020 was 217. -Per Adrian Singh oncology note 11/1do not recommend any additional anticancer treatment.  I believe his performance status is very poor and unlikely to improve.  I think we are looking at a very limited life expectancy due to combination of factors including progressive malignancy, poor performance status and other comorbid conditions. -recommend proceeding with hospice care only at this time and discontinue all cancer treatment.  He is currently not receiving any at this point.  Brain mass -10/26 MRI brain demonstrates large mass consistent with glioma.  See results below -Per Adrian Singh oncology note 11/1 patient has been evaluated by Adrian Singh with a low suspicious for malignancy.  I think it has very limited bearing on his prognosis at this time given his overall poor prognosis anyways.  Back pain -Secondary to metastatic prostate cancer.  Today not complaining of any pain  -Continue to focus on patient's comfort.  Failure to thrive in adult -Patient at end-of-life.  Counseled patient and Adrian Singh that the likelihood of SNF excepting patient is slim to none given his current condition.  Would have palliative care return to have conversation concerning goals  of care, hospice. -Per EMR family unable to care for patient at home adequately therefore patient and family need to consider residential hospice. -Patient weeping fluid out through skin, secondary to poor nutrition  Severe protein calorie malnutrition -dysphagia 2 diet  Chronic systolic CHF -Strict in and  out -Daily weight -11/2 chest pain with S OB -NTG PRN -11/2 EKG shows new T wave abnormality in anterior leads (flipped); ischemia versus NSTEMI -Trend troponin -Echocardiogram pending  Paroxysmal atrial fibrillation -11/2 currently NSR -Metoprolol 50 mg BID -Amiodarone (hold) setting QTC prolongation  Hypokalemia -Potassium goal> four -11/2 K-Dur 40 mEq + potassium IV 30 mEq  Hypomagnesmia -Magnesium goal> two -11/2 magnesium IV 2 g  Deep tissue sacral pressure injury Pressure Injury 07/05/20 Sacrum Deep Tissue Pressure Injury - Purple or maroon localized area of discolored intact skin or blood-filled blister due to damage of underlying soft tissue from pressure and/or shear. (Active)  07/05/20 0600  Location: Sacrum  Location Orientation:   Staging: Deep Tissue Pressure Injury - Purple or maroon localized area of discolored intact skin or blood-filled blister due to damage of underlying soft tissue from pressure and/or shear.  Wound Description (Comments):   Present on Admission: Yes     Pressure Injury 07/17/20 Back Mid Stage 2 -  Partial thickness loss of dermis presenting as a shallow open injury with a red, pink wound bed without slough. (Active)  07/17/20 1305  Location: Back  Location Orientation: Mid  Staging: Stage 2 -  Partial thickness loss of dermis presenting as a shallow open injury with a red, pink wound bed without slough.  Wound Description (Comments):   Present on Admission: Yes           DVT prophylaxis: Lovenox Code Status: Partial Family Communication: 11/2 Adrian Singh at bedside for discussion of plan of care answered all questions Status is: Inpatient    Dispo: The patient is from: Home              Anticipated d/c is to: Hospice residential              Anticipated d/c date is: 11/5              Patient currently unstable      Consultants:  Oncology Palliative Care   Procedures/Significant Events:  10/26 MRI brain W/W0  contrast;1.9 x 1.7 x 2.5 cm enhancing lesion within the posterior right frontal lobe white matter with extension into the adjacent callosal body. This lesion is similar to slightly increased in size as compared to the prior brain MRI of 04/30/2020 and demonstrates persistent mass effect with partial effacement of the right lateral ventricle. Additionally, there has been an interval progression of associated irregular enhancement and restricted diffusion. These findings raise strong suspicion for an intracranial neoplasm (i.e. glioma). Neuro-Oncology consultation is recommended.  There is a punctate focus of restricted diffusion immediately posterior to this lesion within the posterior right frontal lobe, which may reflect an acute infarct or possibly a small satellite tumor nodule.  Moderate generalized cerebral atrophy.  Redemonstrated small chronic cortically based infarcts within the right frontal, parietal and temporal occipital lobes.  New from the prior examination, there is extensive abnormal T1 hypointense marrow signal throughout the visualized upper cervical spine. Primary differential considerations include red marrow reconversion, osseous metastatic disease or a marrow infiltrative Process. ------------------------------------------------------------------------------------------------------------------------------------    I have personally reviewed and interpreted all radiology studies and my findings are as above.  VENTILATOR SETTINGS:    Cultures  Antimicrobials: Anti-infectives (From admission, onward)   None       Devices    LINES / TUBES:      Continuous Infusions:   Objective: Vitals:   07/18/20 1240 07/18/20 1715 07/18/20 2033 07/19/20 0446  BP: 132/66 (!) 164/65 (!) 185/67 (!) 154/62  Pulse: 60 60 60 (!) 58  Resp: 16 16 16 14   Temp: 98.3 F (36.8 C) 98.1 F (36.7 C) 97.8 F (36.6 C) 97.9 F (36.6 C)  TempSrc: Oral Oral Oral  Oral  SpO2: 97% 100% 96% 96%  Weight:      Height:        Intake/Output Summary (Last 24 hours) at 07/19/2020 1151 Last data filed at 07/19/2020 0454 Gross per 24 hour  Intake 120 ml  Output 2000 ml  Net -1880 ml   Filed Weights   07/17/20 1449  Weight: 75.8 kg    Examination:  General: A/O x4, No acute respiratory distress, cachectic Eyes: negative scleral hemorrhage, negative anisocoria, negative icterus ENT: Negative Runny nose, negative gingival bleeding, Neck:  Negative scars, masses, torticollis, lymphadenopathy, JVD Lungs: Clear to auscultation bilaterally without wheezes or crackles Cardiovascular: Regular rate and rhythm without murmur gallop or rub normal S1 and S2 Abdomen: negative abdominal pain, nondistended, positive soft, bowel sounds, no rebound, no ascites, no appreciable mass Extremities: weeping from skin, pockets of fluid secondary to malnutrition Skin: Sacral ulcer, mildly jaundiced Psychiatric:  Negative depression, negative anxiety, negative fatigue, negative mania  Central nervous system:  Cranial nerves II through XII intact, tongue/uvula midline, all extremities muscle strength 2-3 /5, sensation intact throughout, negative dysarthria, negative expressive aphasia, negative receptive aphasia.  .     Data Reviewed: Care during the described time interval was provided by me .  I have reviewed this patient's available data, including medical history, events of note, physical examination, and all test results as part of my evaluation.  CBC: Recent Labs  Lab 07/13/20 0501 07/17/20 1355 07/18/20 0457  WBC 10.6* 9.0 8.1  NEUTROABS 7.1 5.9  --   HGB 9.4* 8.7* 8.8*  HCT 31.7* 29.5* 29.6*  MCV 84.5 81.9 83.1  PLT 97* 125* 063*   Basic Metabolic Panel: Recent Labs  Lab 07/13/20 0501 07/17/20 1355 07/17/20 2000 07/18/20 0457 07/18/20 1637  NA 139 142 147* 143 140  K 4.1 2.1* 2.6* 2.3* 3.7  CL 107 105 107 105 105  CO2 24 26 27 29 24   GLUCOSE  140* 78 138* 100* 89  BUN 26* 12 11 11 10   CREATININE 0.81 0.63 0.67 0.64 0.57*  CALCIUM 7.7* 7.8* 8.1* 7.8* 7.6*  MG 2.0  --   --  1.8  --   PHOS 3.3  --   --   --   --    GFR: Estimated Creatinine Clearance: 72.2 mL/min (A) (by C-G formula based on SCr of 0.57 mg/dL (L)). Liver Function Tests: Recent Labs  Lab 07/13/20 0501 07/17/20 1355  AST 43* 32  ALT 21 13  ALKPHOS 492* 466*  BILITOT 0.5 0.6  PROT 6.2* 6.1*  ALBUMIN 1.8* 1.9*   No results for input(s): LIPASE, AMYLASE in the last 168 hours. No results for input(s): AMMONIA in the last 168 hours. Coagulation Profile: Recent Labs  Lab 07/17/20 1355  INR 1.5*   Cardiac Enzymes: No results for input(s): CKTOTAL, CKMB, CKMBINDEX, TROPONINI in the last 168 hours. BNP (last 3 results) No results for input(s): PROBNP in the last 8760 hours. HbA1C: No results for input(s): HGBA1C in  the last 72 hours. CBG: Recent Labs  Lab 07/14/20 1615 07/14/20 2008 07/15/20 0002 07/15/20 0406 07/15/20 0749  GLUCAP 168* 152* 166* 146* 166*   Lipid Profile: No results for input(s): CHOL, HDL, LDLCALC, TRIG, CHOLHDL, LDLDIRECT in the last 72 hours. Thyroid Function Tests: No results for input(s): TSH, T4TOTAL, FREET4, T3FREE, THYROIDAB in the last 72 hours. Anemia Panel: No results for input(s): VITAMINB12, FOLATE, FERRITIN, TIBC, IRON, RETICCTPCT in the last 72 hours. Sepsis Labs: Recent Labs  Lab 07/17/20 1355 07/17/20 1532  LATICACIDVEN 1.2 1.1    Recent Results (from the past 240 hour(s))  Culture, blood (routine x 2)     Status: None (Preliminary result)   Collection Time: 07/17/20  1:26 PM   Specimen: BLOOD  Result Value Ref Range Status   Specimen Description   Final    BLOOD BLOOD RIGHT FOREARM Performed at [facility] [address], [place], [place] [address]    Special Requests   Final    BOTTLES DRAWN AEROBIC AND ANAEROBIC Blood Culture adequate volume Performed at [facility] [address] [place], [place] [address]    Culture   Final    NO GROWTH 2 DAYS Performed at [facility] [address] [place], [place] [address]    Report Status PENDING  Incomplete  Culture, blood (routine x 2)     Status: None (Preliminary result)   Collection Time: 07/17/20  1:31 PM   Specimen: BLOOD  Result Value Ref Range Status   Specimen Description   Final    BLOOD LEFT ANTECUBITAL Performed at [facility] [address] [place], [place] [address]    Special Requests   Final    BOTTLES DRAWN AEROBIC AND ANAEROBIC Blood Culture adequate volume Performed at [facility] [address], [place], [place] [address]    Culture   Final    NO GROWTH 2 DAYS Performed at [facility] [address] [place], [place] [address]    Report Status PENDING  Incomplete  Urine culture     Status: None   Collection Time: 07/17/20  1:55 PM   Specimen: Urine, Clean Catch  Result Value Ref Range Status   Specimen Description   Final    URINE, CLEAN CATCH Performed at [facility], [place] [address], [place], [place] [address]    Special Requests   Final    NONE Performed at [facility], [place] [address], [place], [place] [address]    Culture   Final    NO GROWTH Performed at [facility], [place] [address], [place],  [address]    Report Status 07/18/2020 FINAL  Final  Respiratory Panel by RT PCR (Flu A&B, Covid) - Nasopharyngeal Swab     Status: None   Collection Time: 07/17/20  1:55 PM   Specimen: Nasopharyngeal Swab  Result Value Ref Range Status   SARS Coronavirus 2 by RT PCR NEGATIVE NEGATIVE Final    Comment: (NOTE) SARS-CoV-2 target nucleic acids are NOT DETECTED.  The SARS-CoV-2 RNA is generally detectable in upper respiratoy specimens during the acute phase of infection. The lowest concentration of SARS-CoV-2 viral copies this assay can detect  is 131 copies/mL. A negative result does not preclude SARS-Cov-2 infection and should not be used as the sole basis for treatment or other patient management decisions. A negative result may occur with  improper specimen collection/handling, submission of specimen  other than nasopharyngeal swab, presence of viral mutation(s) within the areas targeted by this assay, and inadequate number of viral copies (<131 copies/mL). A negative result must be combined with clinical observations, patient history, and epidemiological information. The expected result is Negative.  Fact Sheet for Patients:  PinkCheek.be  Fact Sheet for Healthcare Providers:  GravelBags.it  This test is no t yet approved or cleared by the [place] FDA and  has been authorized for detection and/or diagnosis of SARS-CoV-2 by FDA under an Emergency Use Authorization (EUA). This EUA will remain  in effect (meaning this test can be used) for the duration of the COVID-19 declaration under Section 564(b)(1) of the Act, 21 U.S.C. section 360bbb-3(b)(1), unless the authorization is terminated or revoked sooner.     Influenza A by PCR NEGATIVE NEGATIVE Final   Influenza B by PCR NEGATIVE NEGATIVE Final    Comment: (NOTE) The Xpert Xpress SARS-CoV-2/FLU/RSV assay is intended as an aid in  the diagnosis of influenza from Nasopharyngeal swab specimens and  should not be used as a sole basis for treatment. Nasal washings and  aspirates are unacceptable for Xpert Xpress SARS-CoV-2/FLU/RSV  testing.  Fact Sheet for Patients: PinkCheek.be  Fact Sheet for Healthcare Providers: GravelBags.it  This test is not yet approved or cleared by the [place] FDA and  has been authorized for detection and/or diagnosis of SARS-CoV-2 by  FDA under an Emergency Use Authorization (EUA). This EUA will remain  in effect  (meaning this test can be used) for the duration of the  Covid-19 declaration under Section 564(b)(1) of the Act, 21  U.S.C. section 360bbb-3(b)(1), unless the authorization is  terminated or revoked. Performed at [facility], [place] [address], [place], [place] [address]          Radiology Studies: DG Chest Port 1 View  Result Date: 07/17/2020 CLINICAL DATA:  Cough EXAM: PORTABLE CHEST 1 VIEW COMPARISON:  Chest radiograph dated 07/09/2020 FINDINGS: The heart size and mediastinal contours are within normal limits. Both lungs are clear. The visualized skeletal structures are unremarkable. IMPRESSION: No active disease. Electronically Signed   By: Zerita Boers M.D.   On: 07/17/2020 14:07        Scheduled Meds: . enoxaparin (LOVENOX) injection  40 mg Subcutaneous Q24H  . feeding supplement  237 mL Oral BID BM  . lidocaine  1 patch Transdermal Q24H  . metoprolol tartrate  50 mg Oral BID  . polyethylene glycol  17 g Oral Daily   Continuous Infusions:   LOS: 2 days    Time spent:40 min    Elzabeth Mcquerry, Geraldo Docker, MD [facility] Pager [phone]  If 7PM-7AM, please contact night-coverage [email or web address] Password [facility] 07/19/2020, 11:51 AM

## 2020-07-20 ENCOUNTER — Ambulatory Visit: Payer: Self-pay | Admitting: Neurology

## 2020-07-20 ENCOUNTER — Inpatient Hospital Stay (HOSPITAL_COMMUNITY): Payer: Medicare Other

## 2020-07-20 DIAGNOSIS — I5023 Acute on chronic systolic (congestive) heart failure: Secondary | ICD-10-CM | POA: Diagnosis not present

## 2020-07-20 LAB — CBC WITH DIFFERENTIAL/PLATELET
Abs Immature Granulocytes: 0.52 10*3/uL — ABNORMAL HIGH (ref 0.00–0.07)
Basophils Absolute: 0 10*3/uL (ref 0.0–0.1)
Basophils Relative: 0 %
Eosinophils Absolute: 0 10*3/uL (ref 0.0–0.5)
Eosinophils Relative: 0 %
HCT: 28.4 % — ABNORMAL LOW (ref 39.0–52.0)
Hemoglobin: 8.5 g/dL — ABNORMAL LOW (ref 13.0–17.0)
Immature Granulocytes: 7 %
Lymphocytes Relative: 17 %
Lymphs Abs: 1.2 10*3/uL (ref 0.7–4.0)
MCH: 24.9 pg — ABNORMAL LOW (ref 26.0–34.0)
MCHC: 29.9 g/dL — ABNORMAL LOW (ref 30.0–36.0)
MCV: 83 fL (ref 80.0–100.0)
Monocytes Absolute: 0.5 10*3/uL (ref 0.1–1.0)
Monocytes Relative: 7 %
Neutro Abs: 4.8 10*3/uL (ref 1.7–7.7)
Neutrophils Relative %: 69 %
Platelets: 93 10*3/uL — ABNORMAL LOW (ref 150–400)
RBC: 3.42 MIL/uL — ABNORMAL LOW (ref 4.22–5.81)
RDW: 21.1 % — ABNORMAL HIGH (ref 11.5–15.5)
WBC: 7.1 10*3/uL (ref 4.0–10.5)
nRBC: 0 % (ref 0.0–0.2)

## 2020-07-20 LAB — COMPREHENSIVE METABOLIC PANEL
ALT: 13 U/L (ref 0–44)
AST: 37 U/L (ref 15–41)
Albumin: 2 g/dL — ABNORMAL LOW (ref 3.5–5.0)
Alkaline Phosphatase: 565 U/L — ABNORMAL HIGH (ref 38–126)
Anion gap: 9 (ref 5–15)
BUN: 8 mg/dL (ref 8–23)
CO2: 27 mmol/L (ref 22–32)
Calcium: 7.4 mg/dL — ABNORMAL LOW (ref 8.9–10.3)
Chloride: 103 mmol/L (ref 98–111)
Creatinine, Ser: 0.57 mg/dL — ABNORMAL LOW (ref 0.61–1.24)
GFR, Estimated: >60 mL/min (ref >60)
Glucose, Bld: 120 mg/dL — ABNORMAL HIGH (ref 70–99)
Potassium: 2.5 mmol/L — CL (ref 3.5–5.1)
Sodium: 139 mmol/L (ref 135–145)
Total Bilirubin: 0.8 mg/dL (ref 0.3–1.2)
Total Protein: 5.7 g/dL — ABNORMAL LOW (ref 6.5–8.1)

## 2020-07-20 LAB — MAGNESIUM: Magnesium: 1.9 mg/dL (ref 1.7–2.4)

## 2020-07-20 LAB — ECHOCARDIOGRAM COMPLETE
Area-P 1/2: 2.37 cm2
Height: 70 in
S' Lateral: 3.7 cm
Weight: 2672 oz

## 2020-07-20 LAB — PHOSPHORUS: Phosphorus: 2.4 mg/dL — ABNORMAL LOW (ref 2.5–4.6)

## 2020-07-20 MED ORDER — DEXTROSE IN LACTATED RINGERS 5 % IV SOLN: INTRAVENOUS | Status: DC

## 2020-07-20 MED ORDER — POTASSIUM CHLORIDE 10 MEQ/100ML IV SOLN
10.0000 meq | INTRAVENOUS | Status: AC
  Administered 2020-07-20 (×4): 10 meq via INTRAVENOUS
  Filled 2020-07-20 (×3): qty 100

## 2020-07-20 MED ORDER — POTASSIUM CHLORIDE CRYS ER 20 MEQ PO TBCR
40.0000 meq | EXTENDED_RELEASE_TABLET | ORAL | Status: AC
  Administered 2020-07-20 (×2): 40 meq via ORAL
  Filled 2020-07-20 (×2): qty 2

## 2020-07-20 MED ORDER — GUAIFENESIN-DM 100-10 MG/5ML PO SYRP
5.0000 mL | ORAL_SOLUTION | ORAL | Status: DC | PRN
  Administered 2020-07-20 – 2020-07-26 (×3): 5 mL via ORAL
  Filled 2020-07-20 (×3): qty 10

## 2020-07-20 MED ORDER — MAGNESIUM SULFATE 2 GM/50ML IV SOLN
2.0000 g | Freq: Once | INTRAVENOUS | Status: AC
  Administered 2020-07-20: 2 g via INTRAVENOUS
  Filled 2020-07-20: qty 50

## 2020-07-20 MED ORDER — POTASSIUM CHLORIDE CRYS ER 20 MEQ PO TBCR
40.0000 meq | EXTENDED_RELEASE_TABLET | Freq: Two times a day (BID) | ORAL | Status: DC
  Administered 2020-07-20: 40 meq via ORAL
  Filled 2020-07-20: qty 2

## 2020-07-20 MED ORDER — LABETALOL HCL 5 MG/ML IV SOLN
10.0000 mg | Freq: Once | INTRAVENOUS | Status: DC
  Filled 2020-07-20: qty 4

## 2020-07-20 NOTE — Evaluation (Signed)
Physical Therapy Evaluation Patient Details Name: Adrian Singh MRN: [id number] DOB: 09-10-1934 Today's Date: 07/20/2020   History of Present Illness  86 y.o. WM PMHx CAD status post stenting, A. 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 Multiple recent admissions. (admitted to the hospital 9/23--9/27 orthostatic hypotension), (admitted hospital 10 2--10/ 8 orthostatic hypotension). admitted 10/18-10/29/21 with Ileus. Now admitted with FTT.  Clinical Impression  Pt admitted with above diagnosis. Max assist of 2 for bed mobility, pt sat at edge of bed x 4 minutes, tolerance limited by onset of dizziness. BP supine 161/64, sitting 144/76. Reviewed LE exercises to be done independently for strengthening. SNF recommended as pt's daughter stated they are unable to care for pt at home.  Pt currently with functional limitations due to the deficits listed below (see PT Problem List). Pt will benefit from skilled PT to increase their independence and safety with mobility to allow discharge to the venue listed below.       Follow Up Recommendations SNF    Equipment Recommendations  None recommended by PT    Recommendations for Other Services       Precautions / Restrictions Precautions Precaution Comments: Hx of orthostatic hypotension, Loose BMs Restrictions Weight Bearing Restrictions: No      Mobility  Bed Mobility Overal bed mobility: Needs Assistance Bed Mobility: Rolling;Sidelying to Sit;Sit to Sidelying Rolling: Max assist Sidelying to sit: Max assist;+2 for physical assistance;+2 for safety/equipment;HOB elevated     Sit to sidelying: Total assist;+2 for physical assistance General bed mobility comments: rolled to L, then assisted to sitting at edge of bed for ~4 minutes, tolerance limited by onset of dizziness. BP  supine 161/64, sitting 144/76. Encouraged pt/family to keep [place] elevated and to gradually increase elevation as tolerated.    Transfers                    Ambulation/Gait                Stairs            Wheelchair Mobility    Modified Rankin (Stroke Patients Only)       Balance Overall balance assessment: Needs assistance Sitting-balance support: No upper extremity supported;Feet supported Sitting balance-Leahy Scale: Poor Sitting balance - Comments: initially poor, then fair                                     Pertinent Vitals/Pain Pain Assessment: Faces Faces Pain Scale: Hurts a little bit Pain Location: chest Pain Descriptors / Indicators: Sore Pain Intervention(s): Limited activity within patient's tolerance;Monitored during session    Home Living Family/patient expects to be discharged to:: Skilled nursing facility               Home Equipment: Gilford Rile - 2 wheels;Cane - single point;Cane - quad;Bedside commode;Shower seat;Hand held shower head;Wheelchair - manual;Grab bars - toilet;Hospital bed Additional Comments: Has had several hospital admissions in the last three months. Most recently he was admitted from home, where he was not getting out of his hospital bed due to dizziness.    Prior Function Level of Independence: Needs assistance   Gait / Transfers Assistance Needed: Since last admission he was not getting out of bed  ADL's / Homemaking Assistance Needed: Needs assistance with all ADLs.  Hand Dominance   Dominant Hand: Right    Extremity/Trunk Assessment   Upper Extremity Assessment Upper Extremity Assessment: Defer to OT evaluation    Lower Extremity Assessment Lower Extremity Assessment: Generalized weakness (grossly -4/5, able to actively perform heel slides and ankle pumps in bed)    Cervical / Trunk Assessment Cervical / Trunk Assessment: Kyphotic  Communication   Communication: No  difficulties (low volume)  Cognition Arousal/Alertness: Awake/alert Behavior During Therapy: WFL for tasks assessed/performed Overall Cognitive Status: Within Functional Limits for tasks assessed                                        General Comments      Exercises General Exercises - Lower Extremity Ankle Circles/Pumps: AROM;Both;10 reps;Supine Quad Sets: AROM;Both;5 reps;Supine Gluteal Sets: AROM;Both;5 reps;Supine Heel Slides: AROM;Both;10 reps;Supine   Assessment/Plan    PT Assessment Patient needs continued PT services  PT Problem List Decreased strength;Decreased mobility;Decreased balance;Decreased activity tolerance       PT Treatment Interventions DME instruction;Balance training;Therapeutic exercise;Functional mobility training;Therapeutic activities;Patient/family education;Wheelchair mobility training    PT Goals (Current goals can be found in the Care Plan section)  Acute Rehab PT Goals Patient Stated Goal: "I just like being with my family" PT Goal Formulation: With patient/family Time For Goal Achievement: 08/03/20 Potential to Achieve Goals: Fair    Frequency Min 2X/week   Barriers to discharge        Co-evaluation PT/OT/SLP Co-Evaluation/Treatment: Yes Reason for Co-Treatment: Complexity of the patient's impairments (multi-system involvement);For patient/therapist safety;To address functional/ADL transfers PT goals addressed during session: Mobility/safety with mobility;Balance;Strengthening/ROM         AM-PAC PT "6 Clicks" Mobility  Outcome Measure Help needed turning from your back to your side while in a flat bed without using bedrails?: Total Help needed moving from lying on your back to sitting on the side of a flat bed without using bedrails?: Total Help needed moving to and from a bed to a chair (including a wheelchair)?: Total Help needed standing up from a chair using your arms (e.g., wheelchair or bedside chair)?:  Total Help needed to walk in hospital room?: Total Help needed climbing 3-5 steps with a railing? : Total 6 Click Score: 6    End of Session   Activity Tolerance: Treatment limited secondary to medical complications (Comment) (dizziness in sitting) Patient left: in bed;with call bell/phone within reach;with bed alarm set;with family/visitor present Nurse Communication: Mobility status;Other (comment) (dizzy in sitting, pt soiled with BM) PT Visit Diagnosis: History of falling (Z91.81);Muscle weakness (generalized) (M62.81);Unsteadiness on feet (R26.81);Difficulty in walking, not elsewhere classified (R26.2);Dizziness and giddiness (R42)    Time: 8453-6468 PT Time Calculation (min) (ACUTE ONLY): 18 min   Charges:   PT Evaluation $PT Eval Moderate Complexity: 1 Mod         Philomena Doheny PT 07/20/2020  Acute Rehabilitation Services Pager [phone] Office [phone]

## 2020-07-20 NOTE — Progress Notes (Signed)
PROGRESS NOTE    [name] HOAR  NGE:[id number] DOB: 1934/02/09 DOA: 07/17/2020 PCP: [name], MD    Brief Narrative:  Mr. Basnett was admitted to the hospital with a working dehydration and hypokalemia in the setting of advanced prostate cancer.  86 year old male with past medical history of coronary artery disease, systolic heart failure, paroxysmal atrial fibrillation, type II diabetes mellitus, chronic anemia and prostate cancer with bone metastasis and positive brain mass.  Recently admitted to the hospital for pneumonia and uncontrolled fibrillation, discharged 10/29.  At home he had progressive weakness, poor oral intake and positive mid back pain.  Due to rapid and progressive deconditioning he was brought back to the hospital by his family members.  On his initial physical examination his blood pressure was 149/68, heart rate 70, respiratory rate 18, temperature 97.8, oxygen saturation 98%, he had temporal wasting, heart S1-S2, irregular, bradycardic, lungs with no wheezing or rhonchi, abdomen soft, no lower extremity edema.  Sodium 142, potassium 2.1, chloride 105, bicarb 26, glucose 78, BUN 12, creatinine 0.63, albumin 1.9, BNP 1002, troponin I 30, white count 9.0, hemoglobin 8.7, hematocrit 39.5, platelets 125, INR 1.5.  SARS COVID-19 negative. Urinalysis negative for infection, specific gravity 1.015 30 protein. Chest radiograph with no infiltrates. EKG 80 bpm, left axis deviation, prolonged QTC 581, low voltage, sinus arrhythmia, no ST segment or T wave changes.  Patient received supportive medical therapy, he was seen by oncology, no further recommendations for cancer treatment due to poor functional status and advanced disease.  Patient was seen by physical therapy, recommendations to continue care at the skilled nursing facility.   Assessment & Plan:   Principal Problem:   Failure to thrive in adult Active Problems:   PAF (paroxysmal atrial fibrillation) (HCC)    Prostate cancer ([place])   Primary malignant neoplasm of prostate metastatic to bone (HCC)   General weakness   Pressure ulcer of back   Severe protein-calorie malnutrition (HCC)   Hypokalemia   Back pain   Prolonged QT interval   Chronic systolic CHF (congestive heart failure) ([place])   1. Progressive prostate cancer, complicated with dehydration and hypokalemia/ hypomagnesemia. (brain mass likely not malignant per neuro oncology, likely not malignant)  He has advanced cancer with bone metastasis, rapid progressive decline in his functional status, he is not a candidate for cancer therapy any more. Very poor oral intake.   K is 2,5, Mg is 1,9 renal function with serum cr at 0,57 and bicarbonate of 27.  Continue hydration with balanced electrolyte solutions with dextrose, 40 meq Kcl tid x 3 and 2 g of Mag sulfate x1.  Follow up on renal function and electrolytes in am. Continue to encourage po intake, consult nutrition and PT/OT.   Continue as needed morphine and oral tramadol.   2. Severe protein calorie protein malnutrition/ pressure ulcers (sacrum not able to stage and mid back stage 2). Continue with dysphagia 2 diet, per speech recommendations on last hospitalization.  Will consult speech this time, possible advance his diet.  Continue local skin care.   3. Chronic dyastolic heart failure. Echocardiogram with LV systolic function 40%.  Clinically compensated, continue rate control atrial fibrillation,   4. Paroxysmal atrial fibrillation/ prolonged Qtc. Continue rate control with metoprolol, amiodarone on hold due to prolonged Qtc.   Patient continue to be at high risk for worsening electrolyte disturbances,   Status is: Inpatient  Remains inpatient appropriate because:IV treatments appropriate due to intensity of illness or inability to take PO  Dispo: The patient is from: Home              Anticipated d/c is to: SNF              Anticipated d/c date is: 2 days               Patient currently is not medically stable to d/c.   DVT prophylaxis: Enoxaparin   Code Status:   Partial (no intubation) Family Communication:  I spoke with patient's daughter at the bedside, we talked in detail about patient's condition, plan of care and prognosis and all questions were addressed.      Nutrition Status: Nutrition Problem: Severe Malnutrition Etiology: chronic illness, cancer and cancer related treatments Signs/Symptoms: severe fat depletion, severe muscle depletion Interventions: Ensure Enlive (each supplement provides 350kcal and 20 grams of protein), MVI, Magic cup     Skin Documentation: Pressure Injury 07/05/20 Sacrum Deep Tissue Pressure Injury - Purple or maroon localized area of discolored intact skin or blood-filled blister due to damage of underlying soft tissue from pressure and/or shear. (Active)  07/05/20 0600  Location: Sacrum  Location Orientation:   Staging: Deep Tissue Pressure Injury - Purple or maroon localized area of discolored intact skin or blood-filled blister due to damage of underlying soft tissue from pressure and/or shear.  Wound Description (Comments):   Present on Admission: Yes     Pressure Injury 07/17/20 Back Mid Stage 2 -  Partial thickness loss of dermis presenting as a shallow open injury with a red, pink wound bed without slough. (Active)  07/17/20 1305  Location: Back  Location Orientation: Mid  Staging: Stage 2 -  Partial thickness loss of dermis presenting as a shallow open injury with a red, pink wound bed without slough.  Wound Description (Comments):   Present on Admission: Yes      Subjective: Patient continue to be very weak and deconditioned, poor oral intake, no nausea or vomiting, no chest pain or dyspnea.   Objective: Vitals:   07/19/20 2121 07/20/20 0510 07/20/20 0548 07/20/20 0625  BP: (!) 172/71 (!) 182/66 (!) 181/68 120/61  Pulse: 61 63 60 63  Resp: 16 14    Temp: 97.8 F (36.6 C) 97.7 F (36.5 C)     TempSrc: Oral Oral    SpO2: 97% 97%    Weight:      Height:        Intake/Output Summary (Last 24 hours) at 07/20/2020 1416 Last data filed at 07/20/2020 1131 Gross per 24 hour  Intake 115 ml  Output 1650 ml  Net -1535 ml   Filed Weights   07/17/20 1449  Weight: 75.8 kg    Examination:   General: Not in pain or dyspnea, deconditioned  Neurology: Awake and alert, non focal  E ENT: mild pallor, no icterus, oral mucosa moist Cardiovascular: No JVD. S1-S2 present, rhythmic, no gallops, rubs, or murmurs. No lower extremity edema. Pulmonary: positive breath sounds bilaterally, Gastrointestinal. Abdomen soft and non tender Skin. No rashes Musculoskeletal: no joint deformities     Data Reviewed: I have personally reviewed following labs and imaging studies  CBC: Recent Labs  Lab 07/17/20 1355 07/18/20 0457 07/19/20 1341 07/20/20 0531  WBC 9.0 8.1 7.4 7.1  NEUTROABS 5.9  --  5.2 4.8  HGB 8.7* 8.8* 8.0* 8.5*  HCT 29.5* 29.6* 27.1* 28.4*  MCV 81.9 83.1 82.1 83.0  PLT 125* 114* 101* 93*   Basic Metabolic Panel: Recent Labs  Lab 07/17/20 2000 07/18/20  5638 07/18/20 1637 07/19/20 1341 07/20/20 0531  NA 147* 143 140 138 139  K 2.6* 2.3* 3.7 2.3* 2.5*  CL 107 105 105 100 103  CO2 27 29 24 29 27   GLUCOSE 138* 100* 89 90 120*  BUN 11 11 10 10 8   CREATININE 0.67 0.64 0.57* 0.62 0.57*  CALCIUM 8.1* 7.8* 7.6* 7.4* 7.4*  MG  --  1.8  --  1.7 1.9  PHOS  --   --   --  2.6 2.4*   GFR: Estimated Creatinine Clearance: 72.2 mL/min (A) (by C-G formula based on SCr of 0.57 mg/dL (L)). Liver Function Tests: Recent Labs  Lab 07/17/20 1355 07/19/20 1341 07/20/20 0531  AST 32 42* 37  ALT 13 12 13   ALKPHOS 466* 553* 565*  BILITOT 0.6 0.9 0.8  PROT 6.1* 5.9* 5.7*  ALBUMIN 1.9* 2.0* 2.0*   No results for input(s): LIPASE, AMYLASE in the last 168 hours. No results for input(s): AMMONIA in the last 168 hours. Coagulation Profile: Recent Labs  Lab 07/17/20 1355  INR  1.5*   Cardiac Enzymes: No results for input(s): CKTOTAL, CKMB, CKMBINDEX, TROPONINI in the last 168 hours. BNP (last 3 results) No results for input(s): PROBNP in the last 8760 hours. HbA1C: No results for input(s): HGBA1C in the last 72 hours. CBG: Recent Labs  Lab 07/14/20 1615 07/14/20 2008 07/15/20 0002 07/15/20 0406 07/15/20 0749  GLUCAP 168* 152* 166* 146* 166*   Lipid Profile: No results for input(s): CHOL, HDL, LDLCALC, TRIG, CHOLHDL, LDLDIRECT in the last 72 hours. Thyroid Function Tests: No results for input(s): TSH, T4TOTAL, FREET4, T3FREE, THYROIDAB in the last 72 hours. Anemia Panel: No results for input(s): VITAMINB12, FOLATE, FERRITIN, TIBC, IRON, RETICCTPCT in the last 72 hours.    Radiology Studies: I have reviewed all of the imaging during this hospital visit personally     Scheduled Meds: . enoxaparin (LOVENOX) injection  40 mg Subcutaneous Q24H  . feeding supplement  237 mL Oral BID BM  . labetalol  10 mg Intravenous Once  . lidocaine  1 patch Transdermal Q24H  . metoprolol tartrate  50 mg Oral BID  . polyethylene glycol  17 g Oral Daily  . potassium chloride  40 mEq Oral BID   Continuous Infusions:   LOS: 3 days        Aviyana Sonntag Gerome Apley, MD

## 2020-07-20 NOTE — Progress Notes (Signed)
Occupational Therapy Evaluation   Patient with functional deficits listed below impacting safety and independence with self care. Patient's DTR present during evaluation. Reports patient was home 1.5 days from recent hospitalization and did not get out of hospital bed and were unable to care for patient. DTR also states that MD ordered therapy to "see if he could handle it" for discharge planning, also has palliative consult. Patient requiring max to total assistance with bed mobility and limited sitting tolerance of approximately 4 mins due to dizziness. Patient has had ongoing issues with hypotension and very limited mobility, at this point in time unsure if patient would be able to participate in SNF rehab. Patient will require 24/7 supervision + assistance with all self care and mobility. Acute OT services will continue to follow for GOC and try to maximize functional activity in order to reduce caregiver burden.     07/20/20 1200  OT Visit Information  Last OT Received On 07/20/20  Assistance Needed +2  PT/OT/SLP Co-Evaluation/Treatment Yes  Reason for Co-Treatment For patient/therapist safety;To address functional/ADL transfers  OT goals addressed during session ADL's and self-care  History of Present Illness 86 y.o. WM PMHx CAD status post stenting, A. 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 Multiple recent admissions. (admitted to the hospital 9/23--9/27 orthostatic hypotension), (admitted hospital 10 2--10/ 8 orthostatic hypotension). admitted 10/18-10/29/21 with Ileus. Now admitted with FTT.  Precautions  Precautions Fall  Precaution Comments Hx of orthostatic hypotension, Loose BMs  Restrictions  Weight Bearing Restrictions No  Home Living  Family/patient expects to be discharged to: Unsure  Living Arrangements  Spouse/significant other  [place] - 2 wheels;[place] - single point;Cane - quad;BSC;Shower seat;Hand held shower head;Wheelchair - manual;Grab bars - toilet;Hospital bed  Additional Comments Has had several hospital admissions in the last three months. Most recently he was admitted from home, where he was not getting out of his hospital bed due to dizziness.  Prior Function  Level of Independence Needs assistance  Gait / Transfers Assistance Needed Since last admission he was not getting out of bed  ADL's / [place] Needs assistance with all ADLs.  Comments Pt was at [facility] in the past but not progressing well and not eating or drinking sufficiently  Communication  Communication No difficulties (low volume)  Pain Assessment  Pain Assessment Faces  Faces Pain Scale 2  Pain Location chest  Pain Descriptors / Indicators Sore  Pain Intervention(s) Monitored during session  Cognition  Arousal/Alertness Awake/alert  Behavior During Therapy [facility] for tasks assessed/performed  Overall Cognitive Status Within Functional Limits for tasks assessed  Upper Extremity Assessment  Upper Extremity Assessment Generalized weakness  Lower Extremity Assessment  Lower Extremity Assessment Defer to PT evaluation  Cervical / Trunk Assessment  Cervical / Trunk Assessment Kyphotic  ADL  Overall ADL's  Needs assistance/impaired  Grooming Bed level;Minimal assistance  Upper Body Bathing Maximal assistance;Bed level  Lower Body Bathing Total assistance;Bed level  Upper Body Dressing  Bed level;Maximal assistance  Lower Body Dressing Total assistance;Bed level  Toilet Transfer Details (indicate cue type and reason) unable  Toileting- Clothing Manipulation and Hygiene Total assistance;Bed level  General ADL Comments patient has been requiring significant assistance with self care, was unable to get out of hospital bed at home since previous admission with family unable to  care for him at home  Bed Mobility  Overal  bed mobility Needs Assistance  Bed Mobility Rolling;Sidelying to Sit;Sit to Sidelying  Rolling Max assist  Sidelying to sit Max assist;+2 for physical assistance;+2 for safety/equipment;HOB elevated  Sit to supine Total assist  General bed mobility comments rolled to L, then assisted to sitting at edge of bed for ~4 minutes, tolerance limited by onset of dizziness. BP supine 161/64, sitting 144/76. Encouraged pt/family to keep [place] elevated and to gradually increase elevation as tolerated.  Transfers  General transfer comment deferred due to dizziness at EOB  Balance  Overall balance assessment Needs assistance  Sitting-balance support Single extremity supported;Feet supported  Sitting balance-Leahy Scale Poor  Exercises  Exercises Other exercises  Other Exercises  Other Exercises educated patient/family in UE exercises at bed level, verbalize understanding  OT - End of Session  Activity Tolerance Treatment limited secondary to medical complications (Comment) (dizziness)  Patient left in bed;with call bell/phone within reach;with bed alarm set;with family/visitor present  Nurse Communication Mobility status;Other (comment) (soiled brief)  OT Assessment  OT Recommendation/Assessment Patient needs continued OT Services  OT Visit Diagnosis Muscle weakness (generalized) (M62.81);Other abnormalities of gait and mobility (R26.89)  OT Problem List Decreased strength;Impaired balance (sitting and/or standing);Decreased activity tolerance;Decreased knowledge of use of DME or AE;Decreased safety awareness;Impaired UE functional use;Pain  OT Plan  OT Frequency (ACUTE ONLY) Min 2X/week  OT Treatment/Interventions (ACUTE ONLY) Self-care/ADL training;Therapeutic exercise;Neuromuscular education;DME and/or AE instruction;Therapeutic activities;Patient/family education;Balance training  AM-PAC OT "6 Clicks" Daily Activity Outcome Measure (Version 2)  Help  from another person eating meals? 3  Help from another person taking care of personal grooming? 3  Help from another person toileting, which includes using toliet, bedpan, or urinal? 1  Help from another person bathing (including washing, rinsing, drying)? 2  Help from another person to put on and taking off regular upper body clothing? 2  Help from another person to put on and taking off regular lower body clothing? 1  6 Click Score 12  OT Recommendation  Follow Up Recommendations Supervision/Assistance - 24 hour  OT Equipment None recommended by OT  Individuals Consulted  Consulted and Agree with Results and Recommendations Patient  Acute Rehab OT Goals  Patient Stated Goal "I just like being with my family"  OT Goal Formulation With patient  Time For Goal Achievement 08/03/20  Potential to Achieve Goals Fair  OT Time Calculation  OT Start Time (ACUTE ONLY) 1033  OT Stop Time (ACUTE ONLY) 1056  OT Time Calculation (min) 23 min  OT General Charges  $OT Visit 1 Visit  OT Evaluation  $OT Eval Low Complexity 1 Low  Written Expression  Dominant Hand Right   Adrian Singh OT OT pager: [phone]

## 2020-07-20 NOTE — Progress Notes (Signed)
  Echocardiogram 2D Echocardiogram has been performed.  Randa Lynn Giovanie Lefebre 07/20/2020, 8:43 AM

## 2020-07-20 NOTE — TOC Progression Note (Signed)
Transition of Care [facility]) - Progression Note    Patient Details  Name: Adrian Singh MRN: [id number] Date of Birth: July 12, 1934  [facility]) CM/SW Contact  Kaili Castille, Juliann Pulse, RN Phone Number: 07/20/2020, 3:02 PM  Clinical Narrative: PT recc SNF. Await palliative cons & recc.           Expected Discharge Plan and Services           Expected Discharge Date:  (unknown)                                     Social Determinants of Health (SDOH) Interventions    Readmission Risk Interventions Readmission Risk Prevention Plan 07/19/2020 06/20/2020 06/11/2020  Transportation Screening Complete Complete Complete  Medication Review Press photographer) Complete Complete Complete  PCP or Specialist appointment within 3-5 days of discharge Complete Complete -  [place] or Home Care Consult Complete Complete -  SW Recovery Care/Counseling Consult Complete Complete Complete  Palliative Care Screening Complete Not Applicable Not [place] - Complete Complete  Some recent data might be hidden

## 2020-07-20 NOTE — Progress Notes (Signed)
OT Cancellation Note  Patient Details Name: Adrian Singh MRN: [id number] DOB: [birth year]   Cancelled Treatment:    Reason Eval/Treat Not Completed: Medical issues which prohibited therapy. Per chart review palliative consult pending with family as patient at end of life per MD notes. Will hold OT eval at this time and check back pending family decision for medical care.   Delbert Phenix OT OT pager: [place] 07/20/2020, 7:13 AM

## 2020-07-21 ENCOUNTER — Other Ambulatory Visit: Payer: Self-pay | Admitting: Radiation Therapy

## 2020-07-21 LAB — BASIC METABOLIC PANEL
Anion gap: 7 (ref 5–15)
BUN: 7 mg/dL — ABNORMAL LOW (ref 8–23)
CO2: 28 mmol/L (ref 22–32)
Calcium: 7.8 mg/dL — ABNORMAL LOW (ref 8.9–10.3)
Chloride: 104 mmol/L (ref 98–111)
Creatinine, Ser: 0.58 mg/dL — ABNORMAL LOW (ref 0.61–1.24)
GFR, Estimated: >60 mL/min (ref >60)
Glucose, Bld: 189 mg/dL — ABNORMAL HIGH (ref 70–99)
Potassium: 4.2 mmol/L (ref 3.5–5.1)
Sodium: 139 mmol/L (ref 135–145)

## 2020-07-21 LAB — MAGNESIUM: Magnesium: 2.2 mg/dL (ref 1.7–2.4)

## 2020-07-21 NOTE — TOC Initial Note (Addendum)
Transition of Care [facility]) - Initial/Assessment Note    Patient Details  Name: Adrian Singh MRN: [id number] Date of Birth: Nov 14, 1933  Transition of Care [facility]) CM/SW Contact:    Lia Hopping, [place] Phone Number:  07/21/2020, 11:07 AM  Clinical Narrative:   Patient has medical history for advanced prostate cancer.  Re: SNF placement               CSW discussed SNF placement with the patient. Patient recently discharge to SNF-[facility] for rehab. Patient reports he was home for about a week from SNF and felt dehydrated and came to the emergency room. Patient reports feeling weak and is agreeable to return to SNF for short rehab. Patient gave CSW permission to discuss placement with his daughter [name] [phone]. She is interested in sending the patient to a facility in [place], if a bed is available. She hopes to transition the patient to LTC facility. She reports a medicaid application was completed on his behalf while at [facility] sometime ago. Daughter is unsure if the application was ever submitted. She has plan to complete another one. She is concern if the patient will qualify.  CSW provided resource for Elder at [employer] firm.      CSW made a referral to SNF's in the area.  Insurance Authorization BCBS-medicare ref# [id number]  Expected Discharge Plan: Skilled Nursing Facility Barriers to Discharge: Insurance Authorization, Other (comment)   Patient Goals and CMS Choice Patient states their goals for this hospitalization and ongoing recovery are:: go back to rehab CMS Medicare.gov Compare Post Acute Care list provided to:: Patient Choice offered to / list presented to : Adult Children  Expected Discharge Plan and Services Expected Discharge Plan: [place]   Discharge Planning Services: CM Consult Post Acute Care Choice: [place] Living arrangements for the past 2 months: [place] Expected Discharge Date:   (unknown)                                    Prior Living Arrangements/Services Living arrangements for the past 2 months: [place] Lives with:: Spouse Patient language and need for interpreter reviewed:: Yes Do you feel safe going back to the place where you live?: Yes      Need for Family Participation in Patient Care: Yes (Comment) Care giver support system in place?: Yes (comment) Current home services: DME Criminal Activity/Legal Involvement Pertinent to Current Situation/Hospitalization: No - Comment as needed  Activities of Daily Living Home Assistive Devices/Equipment: Cane (specify quad or straight), Walker (specify type), Wheelchair, Dentures (specify type), Bedside commode/3-in-1, Hand-held shower hose, Grab bars around toilet, Shower chair with back, CBG Meter (front wheeled walker, quad cane, manual wheelchair,upper/lower dentures) ADL Screening (condition at time of admission) Patient's cognitive ability adequate to safely complete daily activities?: Yes Is the patient deaf or have difficulty hearing?: Yes Does the patient have difficulty seeing, even when wearing glasses/contacts?: No Does the patient have difficulty concentrating, remembering, or making decisions?: Yes Patient able to express need for assistance with ADLs?: Yes Does the patient have difficulty dressing or bathing?: Yes Independently performs ADLs?: No Communication: Independent Dressing (OT): Dependent Is this a change from baseline?: Pre-admission baseline Grooming: Dependent Is this a change from baseline?: Pre-admission baseline Feeding: Dependent Is this a change from baseline?: Pre-admission baseline Bathing: Dependent Is this a change from baseline?: Pre-admission baseline Toileting: Dependent Is this a  change from baseline?: Pre-admission baseline In/Out Bed: Dependent Is this a change from baseline?: Pre-admission baseline Walks in Home: Dependent Is this a change  from baseline?: Pre-admission baseline Does the patient have difficulty walking or climbing stairs?: Yes (secondary to weakness) Weakness of Legs: Both Weakness of Arms/Hands: Both  Permission Sought/Granted Permission sought to share information with : Facility Sport and exercise psychologist, Family Supports Permission granted to share information with : Yes, Verbal Permission Granted  Share Information with NAME: [name]  Permission granted to share info w AGENCY: SNF  Permission granted to share info w Relationship: Daughter     Emotional Assessment Appearance:: Appears stated age Attitude/Demeanor/Rapport: Engaged Affect (typically observed): Accepting Orientation: : Oriented to Self, Oriented to Place, Oriented to  Time, Oriented to Situation Alcohol / Substance Use: Not Applicable Psych Involvement: No (comment)  Admission diagnosis:  Hypokalemia [E87.6] General weakness [R53.1] Failure to thrive in adult [R62.7] Severe comorbid illness [R69] Patient Active Problem List   Diagnosis Date Noted  . Failure to thrive in adult 07/17/2020  . Back pain 07/17/2020  . Prolonged QT interval 07/17/2020  . Chronic systolic CHF (congestive heart failure) ([place]) 07/17/2020  . Ileus ([place]) 07/05/2020  . Hypokalemia 07/05/2020  . Atrial fibrillation with RVR ([place]) 07/05/2020  . Postural dizziness with presyncope 06/09/2020  . Severe protein-calorie malnutrition ([place]) 05/06/2020  . Palliative care by specialist   . Goals of care, counseling/discussion   . DNR (do not resuscitate) discussion   . Adult failure to thrive   . Need for emotional support   . Pressure ulcer of back 05/04/2020  . Stroke ([place]) 05/02/2020  . General weakness 04/30/2020  . Fall at home, initial encounter 04/30/2020  . Acute on chronic anemia 04/30/2020  . Elevated CK 04/30/2020  . Chronic kidney disease, stage 3a ([place]) 04/30/2020  . Elevated troponin 04/30/2020  . Fatigue 04/29/2020  . Malnutrition of mild degree  ([place]) 04/06/2019  . Diabetic foot ulcer ([place]) 04/06/2019  . Mood disorder ([place]) 04/06/2019  . Aortic atherosclerosis ([place]) 02/13/2017  . Prostate cancer ([place]) 02/13/2017  . Primary malignant neoplasm of prostate metastatic to bone ([place]) 02/13/2017  . Orthostatic hypotension 03/19/2016  . CAD S/P percutaneous coronary angioplasty 02/28/2016  . Atherosclerosis of native coronary artery of native heart with angina pectoris ([place])   . Abnormal nuclear stress test 09/22/2015  . Thrombocytopenia ([place])   . Advanced directives, counseling/discussion 09/27/2014  . PAF (paroxysmal atrial fibrillation) ([place]) 04/12/2014  . History of CVA (cerebrovascular accident) 03/31/2014  . CKD stage 3 due to type 2 diabetes mellitus ([place])   . Uncontrolled type 2 diabetes mellitus with diabetic polyneuropathy, without long-term current use of insulin ([place]) 11/05/2011  . Routine general medical examination at a health care facility 05/07/2011  . Essential hypertension 12/23/2009  . GERD 12/23/2009  . BPH (benign prostatic hyperplasia) 12/23/2009  . OSTEOARTHRITIS 12/23/2009   PCP:  [name], MD Pharmacy:   [phone] [address], [place] [address] Phone: [phone] Fax: [phone]  [place] [address] [place] [address] Phone: [phone] Fax: [phone]  [facility] [place], [facility] AT [facility] [address] [place] [address] Phone: [phone] Fax: [phone]     Social Determinants of Health ([place]) Interventions    Readmission Risk Interventions Readmission Risk Prevention Plan 07/19/2020 06/20/2020 06/11/2020  Transportation Screening Complete Complete Complete  Medication Review Press photographer) Complete Complete Complete  PCP or Specialist appointment within 3-5 days of discharge Complete Complete -  [place] or  Home Care Consult Complete Complete -  SW Recovery Care/Counseling Consult Complete Complete Complete  Palliative Care Screening Complete Not Applicable Not [place] - Complete Complete  Some recent data might be hidden

## 2020-07-21 NOTE — NC FL2 (Addendum)
[place] LEVEL OF CARE SCREENING TOOL     IDENTIFICATION  Patient Name: Adrian Singh Birthdate: Sep 23, 1933 Sex: male Admission Date (Current Location): 07/17/2020  [facility] and [place] Number:  Herbalist and Address:  [facility],  [place], [place]      Provider Number: [id number]  Attending Physician Name and Address:  Tawni Millers,*  Relative Name and Phone Number:  Yashar Inclan wife [phone]    Current Level of Care: Hospital Recommended Level of Care: [place] Prior Approval Number:    Date Approved/Denied:   PASRR Number: [id number]  Discharge Plan: SNF    Current Diagnoses: Patient Active Problem List   Diagnosis Date Noted  . Failure to thrive in adult 07/17/2020  . Back pain 07/17/2020  . Prolonged QT interval 07/17/2020  . Chronic systolic CHF (congestive heart failure) ([place]) 07/17/2020  . Ileus ([place]) 07/05/2020  . Hypokalemia 07/05/2020  . Atrial fibrillation with RVR ([place]) 07/05/2020  . Postural dizziness with presyncope 06/09/2020  . Severe protein-calorie malnutrition ([place]) 05/06/2020  . Palliative care by specialist   . Goals of care, counseling/discussion   . DNR (do not resuscitate) discussion   . Adult failure to thrive   . Need for emotional support   . Pressure ulcer of back 05/04/2020  . Stroke ([place]) 05/02/2020  . General weakness 04/30/2020  . Fall at home, initial encounter 04/30/2020  . Acute on chronic anemia 04/30/2020  . Elevated CK 04/30/2020  . Chronic kidney disease, stage 3a ([place]) 04/30/2020  . Elevated troponin 04/30/2020  . Fatigue 04/29/2020  . Malnutrition of mild degree ([place]) 04/06/2019  . Diabetic foot ulcer ([place]) 04/06/2019  . Mood disorder ([place]) 04/06/2019  . Aortic atherosclerosis ([place]) 02/13/2017  . Prostate cancer ([place]) 02/13/2017  . Primary malignant neoplasm of prostate metastatic to bone ([place]) 02/13/2017  .  Orthostatic hypotension 03/19/2016  . CAD S/P percutaneous coronary angioplasty 02/28/2016  . Atherosclerosis of native coronary artery of native heart with angina pectoris ([place])   . Abnormal nuclear stress test 09/22/2015  . Thrombocytopenia ([place])   . Advanced directives, counseling/discussion 09/27/2014  . PAF (paroxysmal atrial fibrillation) ([place]) 04/12/2014  . History of CVA (cerebrovascular accident) 03/31/2014  . CKD stage 3 due to type 2 diabetes mellitus ([place])   . Uncontrolled type 2 diabetes mellitus with diabetic polyneuropathy, without long-term current use of insulin ([place]) 11/05/2011  . Routine general medical examination at a health care facility 05/07/2011  . Essential hypertension 12/23/2009  . GERD 12/23/2009  . BPH (benign prostatic hyperplasia) 12/23/2009  . OSTEOARTHRITIS 12/23/2009    Orientation RESPIRATION BLADDER Height & Weight     Self, Time, Place, Situation  O2 External catheter Weight: 167 lb (75.8 kg) Height:  5\' 10"  (177.8 cm)  BEHAVIORAL SYMPTOMS/MOOD NEUROLOGICAL BOWEL NUTRITION STATUS      Incontinent  (Dysphagia 2)  AMBULATORY STATUS COMMUNICATION OF NEEDS Skin   Extensive Assist Verbally PU Stage and Appropriate Care ([place]) , Skin Abrasions, Skin Tear                       Personal Care Assistance Level of Assistance  Bathing, Feeding, Dressing Bathing Assistance: Maximum assistance Feeding assistance: Limited assistance Dressing Assistance: Maximum assistance     Functional Limitations Info  Sight, Hearing, Speech Sight Info: Impaired   Speech Info: Adequate    SPECIAL CARE FACTORS FREQUENCY  PT (By licensed PT), OT (By licensed OT)  Speech Therapy      PT Frequency: 5X/WEEK OT Frequency: 5X/WEEK   Frequency: 1X/WEEK          Contractures Contractures Info: Not present    Additional Factors Info  Code Status, Allergies Code Status Info: FULLCODE Allergies Info: Allergies: [place] Carisoprodol, Doxazosin            Current Medications (07/21/2020):  This is the current hospital active medication list Current Facility-Administered Medications  Medication Dose Route Frequency Provider Last Rate Last Admin  . acetaminophen (TYLENOL) tablet 650 mg  650 mg Oral Q6H PRN Harold Hedge, MD       Or  . acetaminophen (TYLENOL) suppository 650 mg  650 mg Rectal Q6H PRN Harold Hedge, MD      . dextrose 5 % in lactated ringers infusion   Intravenous Continuous Arrien, Jimmy Picket, MD   Stopped at 07/20/20 1642  . docusate sodium (COLACE) capsule 200 mg  200 mg Oral BID PRN Harold Hedge, MD      . enoxaparin (LOVENOX) injection 40 mg  40 mg Subcutaneous Q24H Harold Hedge, MD   40 mg at 07/20/20 2032  . feeding supplement (ENSURE ENLIVE / ENSURE PLUS) liquid 237 mL  237 mL Oral BID BM Dwyane Dee, MD   237 mL at 07/21/20 1015  . guaiFENesin-dextromethorphan (ROBITUSSIN DM) 100-10 MG/5ML syrup 5 mL  5 mL Oral Q4H PRN Arrien, Jimmy Picket, MD   5 mL at 07/20/20 1951  . labetalol (NORMODYNE) injection 10 mg  10 mg Intravenous Once Lang Snow, FNP      . lidocaine (LIDODERM) 5 % 1 patch  1 patch Transdermal Q24H Harold Hedge, MD   1 patch at 07/20/20 2032  . metoprolol tartrate (LOPRESSOR) tablet 50 mg  50 mg Oral BID Harold Hedge, MD   50 mg at 07/21/20 1005  . morphine 2 MG/ML injection 2 mg  2 mg Intravenous Q2H PRN Harold Hedge, MD      . nitroGLYCERIN (NITROSTAT) SL tablet 0.4 mg  0.4 mg Sublingual Q5 min PRN Harold Hedge, MD   0.4 mg at 07/20/20 0555  . polyethylene glycol (MIRALAX / GLYCOLAX) packet 17 g  17 g Oral Daily Harold Hedge, MD   17 g at 07/21/20 1005  . traMADol (ULTRAM) tablet 50 mg  50 mg Oral Q6H PRN Dwyane Dee, MD   50 mg at 07/20/20 1953     Discharge Medications: Please see discharge summary for a list of discharge medications.  Relevant Imaging Results:  Relevant Lab Results:   Additional Information [id number]  Lia Hopping, LCSW

## 2020-07-21 NOTE — TOC Progression Note (Signed)
Transition of Care [facility]) - Progression Note    Patient Details  Name: Adrian Singh MRN: [id number] Date of Birth: 12/26/1933  [facility]) CM/SW [place], LCSW Phone Number: 07/21/2020, 3:16 PM  Clinical Narrative:    CSW provided bed offers to the patient daughter [name].   Expected Discharge Plan: Skilled Nursing Facility Barriers to Discharge: Ship broker, Other (comment)  Expected Discharge Plan and Services Expected Discharge Plan: [place]   Discharge Planning Services: CM Consult Post Acute Care Choice: [place] Living arrangements for the past 2 months: [place] Expected Discharge Date:  (unknown)                                     Social Determinants of Health (SDOH) Interventions    Readmission Risk Interventions Readmission Risk Prevention Plan 07/19/2020 06/20/2020 06/11/2020  Transportation Screening Complete Complete Complete  Medication Review Press photographer) Complete Complete Complete  PCP or Specialist appointment within 3-5 days of discharge Complete Complete -  [place] or Home Care Consult Complete Complete -  SW Recovery Care/Counseling Consult Complete Complete Complete  Palliative Care Screening Complete Not Applicable Not [place] - Complete Complete  Some recent data might be hidden

## 2020-07-21 NOTE — Progress Notes (Signed)
Nutrition Follow-up  DOCUMENTATION CODES:   Severe malnutrition in context of chronic illness  INTERVENTION:   -Ensure Enlive po BID, each supplement provides 350 kcal and 20 grams of protein -Magic cup BID with meals, each supplement provides 290 kcal and 9 grams of protein (automatically with dysphagia 2 diet)  NUTRITION DIAGNOSIS:   Severe Malnutrition related to chronic illness, cancer and cancer related treatments as evidenced by severe fat depletion, severe muscle depletion.  Ongoing.  GOAL:   Patient will meet greater than or equal to 90% of their needs  Progressing.  MONITOR:   PO intake, Supplement acceptance, Labs, Weight trends, I & O's, Skin  REASON FOR ASSESSMENT:   Consult Assessment of nutrition requirement/status  ASSESSMENT:   86 year old male with past medical history of CAD, systolic heart failure, paroxysmal atrial fibrillation not on anticoagulation, aspiration pneumonia, debility, type 2 diabetes on oral hypoglycemics, chronic anemia, metastatic prostate cancer to bone, brain mass who was discharged from the hospital only 2 days ago (10/29) after treatment for HCAP and A. fib with RVR who presents to the ED with cough and failure to thrive since discharge.  Patient seen by SLP today, recommends regular diet so pt can have choice of food items. Pt with moderate-severe aspiration risk.   Pt consumed 100% of breakfast this morning of oatmeal and Magic cup (630 kcals, 14g protein). On 11/3, pt consumed 0-100% of meals.  Accepting at least 1 Ensure daily. Will continue supplements as ordered.  Labs reviewed. Medications: Miralax  Diet Order:   Diet Order            DIET DYS 2 Room service appropriate? Yes; Fluid consistency: Thin  Diet effective now                 EDUCATION NEEDS:   No education needs have been identified at this time  Skin:  Skin Integrity Issues:: Stage II Stage II: mid back  Last BM:  10/27  Height:   Ht Readings  from Last 1 Encounters:  07/17/20 5\' 10"  (1.778 m)    Weight:   Wt Readings from Last 1 Encounters:  07/17/20 75.8 kg   BMI:  Body mass index is 23.96 kg/m.  Estimated Nutritional Needs:   Kcal:  2050-2250  Protein:  100-115g  Fluid:  2L/day  Clayton Bibles, MS, RD, LDN Inpatient Clinical Dietitian Contact information available via Amion

## 2020-07-21 NOTE — Progress Notes (Signed)
  Speech Language Pathology Treatment: Dysphagia  Patient Details Name: Adrian Singh MRN: [id number] DOB: April 13, 1934 Today's Date: 07/21/2020 Time: 0951-1005 SLP Time Calculation (min) (ACUTE ONLY): 14 min  Assessment / Plan / Recommendation Clinical Impression  Second visit for education with pt regarding aspiration and dysphagia mitigation strategies reviewing prior MBS study from August 2021 with him in detail.  SLP showed pt his swallow study on Iphone due to room computer not working.   Pt continues with marginal delay in swallowing - and he provided visual cue to staff to allow him time to swallow before giving more- encouraged him to continue this strategy.    Using teach back, informed pt to clinically reasoning for liquid consumption during intake and consuming small frequent meals.  Of note, pt reports that he feels like his esophagus is being "shredded" when drinking water mostly - SLP questions source?????   He has been on a PPI previously but states it was discontinued due to his delay in processing food.  Suspect multiple medical issues account for his FTT including his oropharyngeal dysphagia from his CVA, h/o esophageal dysphagia, metastatic cancer and issues with "processing food".    Reviewed importance for pt to remove dentures nightly, brush his gums/tongue for oral health and use his denture adhesive to ease mastication with solids.  Pt had no awareness that his lower dentures were lodged in his pharynx, thus sign posted for family/pt/staff education.  Pt agreeable to above recommendations.    Anticipate he will continue to self limit his diet due to dysphagia awareness and thus diet as tolerated continues to be advised.  SLP will sign off, note palliative referral pending.  Thanks for allowing me to help care for this pt.     HPI HPI: Adrian Singh is a 86 y.o. WM PMHx CAD status post stenting, A. 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 CXR 10/22 concerning for LLL pna with most recent CXR being negative on 07/17/2020.  Pt with hx of silent aspiration with MBS 05/04/20 revealing post prandial silent aspiration of all consistencies of liquid - pt reports throat retention with food started after his stroke.      SLP Plan  All goals met       Recommendations  Diet recommendations: Regular;Thin liquid (to allow pt to choose food items) Liquids provided via: Cup;Straw Medication Administration: Other (Comment) (pt wants crushed) Compensations: Slow rate;Small sips/bites;Clear throat intermittently Postural Changes and/or Swallow Maneuvers: Seated upright 90 degrees;Upright 30-60 min after meal                Oral Care Recommendations: Oral care QID (dentures out nightly) Follow up Recommendations: Skilled Nursing facility;24 hour supervision/assistance SLP Visit Diagnosis: Dysphagia, oropharyngeal phase (R13.12) Plan: All goals met       GO                Macario Golds 07/21/2020, 11:02 AM   Kathleen Lime, MS [facility] SLP Acute Rehab Services Office [phone] Pager [phone]

## 2020-07-21 NOTE — Progress Notes (Addendum)
PROGRESS NOTE    Adrian Singh  XHB:[id number] DOB: 1933-12-30 DOA: 07/17/2020 PCP: [name], MD    Brief Narrative:  [name] was admitted to the hospital with a working dehydration and hypokalemia in the setting of advanced prostate cancer.  86 year old male with past medical history of coronary artery disease, systolic heart failure, paroxysmal atrial fibrillation, type II diabetes mellitus, chronic anemia and prostate cancer with bone metastasis and positive brain mass.  Recently admitted to the hospital for pneumonia and uncontrolled fibrillation, discharged 10/29.  At home he had progressive weakness, poor oral intake and positive mid back pain.  Due to rapid and progressive deconditioning he was brought back to the hospital by his family members.  On his initial physical examination his blood pressure was 149/68, heart rate 70, respiratory rate 18, temperature 97.8, oxygen saturation 98%, he had temporal wasting, heart S1-S2, irregular, bradycardic, lungs with no wheezing or rhonchi, abdomen soft, no lower extremity edema.  Sodium 142, potassium 2.1, chloride 105, bicarb 26, glucose 78, BUN 12, creatinine 0.63, albumin 1.9, BNP 1002, troponin I 30, white count 9.0, hemoglobin 8.7, hematocrit 39.5, platelets 125, INR 1.5.  SARS COVID-19 negative. Urinalysis negative for infection, specific gravity 1.015 30 protein. Chest radiograph with no infiltrates. EKG 80 bpm, left axis deviation, prolonged QTC 581, low voltage, sinus arrhythmia, no ST segment or T wave changes.  Patient received supportive medical therapy, he was seen by oncology, no further recommendations for cancer treatment due to poor functional status and advanced disease.  Patient was seen by physical therapy, recommendations to continue care at the skilled nursing facility.    Assessment & Plan:   Principal Problem:   Hypokalemia Active Problems:   PAF (paroxysmal atrial fibrillation) (HCC)    Prostate cancer ([place])   Primary malignant neoplasm of prostate metastatic to bone (HCC)   General weakness   Pressure ulcer of back   Severe protein-calorie malnutrition (HCC)   Failure to thrive in adult   Back pain   Prolonged QT interval   Chronic systolic CHF (congestive heart failure) ([place])   1. Progressive prostate cancer, complicated with dehydration and hypokalemia/ hypomagnesemia. (brain mass likely not malignant per neuro oncology, likely not malignant)  He has advanced cancer with bone metastasis, rapid progressive decline in his functional status, he is not a candidate for cancer therapy any more.    His po intake slowly improving but not yet back to baseline.   Stable renal function with serum cr at 0,58, K is 4,2 and Mg at 2,2.  Hold on IV fluids, pain control with PRN morphine and tramadol. Add incentive spirometer.   Continue to encourage po intake. Plan to transfer to SNF to continue care.    2. Severe protein calorie protein malnutrition/ pressure ulcers (sacrum not able to stage and mid back stage 2).  Patient had new speech and swallow evaluation this, and his diet has been liberalized to regular with good toleration.  Continue to encourage po intake. Hold on IV fluids.   3. Chronic dyastolic heart failure. Echocardiogram with LV systolic function 81%.  No clinical sings of decompensation.    4. Paroxysmal atrial fibrillation/ prolonged Qtc. tolearting well rate control with metoprolol.  Continue to hold on amiodarone due to prolonged qtc.   Check EKG today.   Status is: Inpatient  Remains inpatient appropriate because:Inpatient level of care appropriate due to severity of illness   Dispo: The patient is from: Home  Anticipated d/c is to: SNF              Anticipated d/c date is: 1 day              Patient currently is not medically stable to d/c.  DVT prophylaxis: apixaban   Code Status:    partial   Family Communication:  I spoke  with patient's daugher at the bedside, we talked in detail about patient's condition, plan of care and prognosis and all questions were addressed.      Nutrition Status: Nutrition Problem: Severe Malnutrition Etiology: chronic illness, cancer and cancer related treatments Signs/Symptoms: severe fat depletion, severe muscle depletion Interventions: Ensure Enlive (each supplement provides 350kcal and 20 grams of protein), MVI, Magic cup     Skin Documentation: Pressure Injury 07/05/20 Sacrum Deep Tissue Pressure Injury - Purple or maroon localized area of discolored intact skin or blood-filled blister due to damage of underlying soft tissue from pressure and/or shear. (Active)  07/05/20 0600  Location: Sacrum  Location Orientation:   Staging: Deep Tissue Pressure Injury - Purple or maroon localized area of discolored intact skin or blood-filled blister due to damage of underlying soft tissue from pressure and/or shear.  Wound Description (Comments):   Present on Admission: Yes     Pressure Injury 07/17/20 Back Mid Stage 2 -  Partial thickness loss of dermis presenting as a shallow open injury with a red, pink wound bed without slough. (Active)  07/17/20 1305  Location: Back  Location Orientation: Mid  Staging: Stage 2 -  Partial thickness loss of dermis presenting as a shallow open injury with a red, pink wound bed without slough.  Wound Description (Comments):   Present on Admission: Yes     Consultants:  Oncology   Subjective: Patient this am is feeling better, but not yet back to baseline, continue to be weak and deconditioned. No nausea or vomiting, no dyspnea. Appetite is improving,.   Objective: Vitals:   07/20/20 0625 07/20/20 1423 07/20/20 2139 07/21/20 0552  BP: 120/61 (!) 146/61 (!) 166/64 (!) 169/64  Pulse: 63 62 66 61  Resp:  16 16 16   Temp:  98.7 F (37.1 C) 98.1 F (36.7 C) (!) 97.5 F (36.4 C)  TempSrc:  Oral Oral Oral  SpO2:  98% 100% 99%  Weight:        Height:        Intake/Output Summary (Last 24 hours) at 07/21/2020 1125 Last data filed at 07/21/2020 0830 Gross per 24 hour  Intake 1429.11 ml  Output 1100 ml  Net 329.11 ml   Filed Weights   07/17/20 1449  Weight: 75.8 kg    Examination:   General: Not in pain or dyspnea, deconditioned  Neurology: Awake and alert, non focal  E ENT: posirive pallor, no icterus, oral mucosa moist Cardiovascular: No JVD. S1-S2 present, rhythmic, no gallops, rubs, or murmurs. No lower extremity edema. Pulmonary: positive breath sounds bilaterally, adequate air movement, no wheezing, rhonchi or rales. Gastrointestinal. Abdomen soft and non tender Skin. No rashes Musculoskeletal:  Severe muscle wasting lower extremities.      Data Reviewed: I have personally reviewed following labs and imaging studies  CBC: Recent Labs  Lab 07/17/20 1355 07/18/20 0457 07/19/20 1341 07/20/20 0531  WBC 9.0 8.1 7.4 7.1  NEUTROABS 5.9  --  5.2 4.8  HGB 8.7* 8.8* 8.0* 8.5*  HCT 29.5* 29.6* 27.1* 28.4*  MCV 81.9 83.1 82.1 83.0  PLT 125* 114* 101* 93*   Basic  Metabolic Panel: Recent Labs  Lab 07/18/20 0457 07/18/20 1637 07/19/20 1341 07/20/20 0531 07/21/20 0536  NA 143 140 138 139 139  K 2.3* 3.7 2.3* 2.5* 4.2  CL 105 105 100 103 104  CO2 29 24 29 27 28   GLUCOSE 100* 89 90 120* 189*  BUN 11 10 10 8  7*  CREATININE 0.64 0.57* 0.62 0.57* 0.58*  CALCIUM 7.8* 7.6* 7.4* 7.4* 7.8*  MG 1.8  --  1.7 1.9 2.2  PHOS  --   --  2.6 2.4*  --    GFR: Estimated Creatinine Clearance: 72.2 mL/min (A) (by C-G formula based on SCr of 0.58 mg/dL (L)). Liver Function Tests: Recent Labs  Lab 07/17/20 1355 07/19/20 1341 07/20/20 0531  AST 32 42* 37  ALT 13 12 13   ALKPHOS 466* 553* 565*  BILITOT 0.6 0.9 0.8  PROT 6.1* 5.9* 5.7*  ALBUMIN 1.9* 2.0* 2.0*   No results for input(s): LIPASE, AMYLASE in the last 168 hours. No results for input(s): AMMONIA in the last 168 hours. Coagulation Profile: Recent Labs   Lab 07/17/20 1355  INR 1.5*   Cardiac Enzymes: No results for input(s): CKTOTAL, CKMB, CKMBINDEX, TROPONINI in the last 168 hours. BNP (last 3 results) No results for input(s): PROBNP in the last 8760 hours. HbA1C: No results for input(s): HGBA1C in the last 72 hours. CBG: Recent Labs  Lab 07/14/20 1615 07/14/20 2008 07/15/20 0002 07/15/20 0406 07/15/20 0749  GLUCAP 168* 152* 166* 146* 166*   Lipid Profile: No results for input(s): CHOL, HDL, LDLCALC, TRIG, CHOLHDL, LDLDIRECT in the last 72 hours. Thyroid Function Tests: No results for input(s): TSH, T4TOTAL, FREET4, T3FREE, THYROIDAB in the last 72 hours. Anemia Panel: No results for input(s): VITAMINB12, FOLATE, FERRITIN, TIBC, IRON, RETICCTPCT in the last 72 hours.    Radiology Studies: I have reviewed all of the imaging during this hospital visit personally     Scheduled Meds: . enoxaparin (LOVENOX) injection  40 mg Subcutaneous Q24H  . feeding supplement  237 mL Oral BID BM  . labetalol  10 mg Intravenous Once  . lidocaine  1 patch Transdermal Q24H  . metoprolol tartrate  50 mg Oral BID  . polyethylene glycol  17 g Oral Daily   Continuous Infusions: . dextrose 5% lactated ringers Stopped (07/20/20 1642)     LOS: 4 days        Kenzleigh Sedam Gerome Apley, MD

## 2020-07-21 NOTE — Evaluation (Signed)
Clinical/Bedside Swallow Evaluation Patient Details  Name: Adrian Singh MRN: [id number] Date of Birth: 02-18-1934  Today's Date: 07/21/2020 Time: SLP Start Time (ACUTE ONLY): 0109 SLP Stop Time (ACUTE ONLY): 0949 SLP Time Calculation (min) (ACUTE ONLY): 54 min  Past Medical History:  Past Medical History:  Diagnosis Date  . Arthritis    "legs, back" (10/06/2015)  . Atrial fibrillation ([place])    x1  . Atrial fibrillation ([place])   . CAD (coronary artery disease)    nonobstructive  . Cancer ([place])    skin cancer on ear (froze it off) and back (cut it off)  . Dysrhythmia   . Esophageal reflux    hx of  . History of kidney stones   . Hx of heart artery stent   . Hypertrophy of prostate without urinary obstruction and other lower urinary tract symptoms (LUTS)   . Osteoarthrosis, unspecified whether generalized or localized, unspecified site   . Prostate cancer ([place])   . Skin cancer   . Stroke [facility]) 03/2014   "had a series of mini strokes; maybe 4"; denies residual on 10/06/2015  . Thrombocytopenia ([place])   . Type II diabetes mellitus ([place])    type 2  . Unspecified essential hypertension    Past Surgical History:  Past Surgical History:  Procedure Laterality Date  . BACK SURGERY     2001  . CARDIAC CATHETERIZATION  12/2007  . CARDIAC CATHETERIZATION N/A 09/22/2015   Procedure: Left Heart Cath and Coronary Angiography;  Surgeon: Peter M Martinique, MD;  Location: [place] [facility];  Service: Cardiovascular;  Laterality: N/A;  . CARDIAC CATHETERIZATION N/A 10/06/2015   Procedure: Coronary Stent Intervention;  Surgeon: Peter M Martinique, MD;  Location: [place] [facility];  Service: Cardiovascular;  Laterality: N/A;  . CARDIAC CATHETERIZATION N/A 02/29/2016   Procedure: Left Heart Cath and Coronary Angiography;  Surgeon: Jettie Booze, MD;  Location: [place] [facility];  Service: Cardiovascular;  Laterality: N/A;  . COLONOSCOPY W/ BIOPSIES AND POLYPECTOMY    . CORONARY STENT PLACEMENT   10/06/2015   [name]  with DES  . EAR CYST EXCISION N/A 04/06/2015   Procedure: EXCISION OF SCALP CYST;  Surgeon: Donnie Mesa, MD;  Location: [place];  Service: General;  Laterality: N/A;  . ESOPHAGOGASTRODUODENOSCOPY (EGD) WITH ESOPHAGEAL DILATION  2001   with dilation  . LUMBAR DISC SURGERY  09/26/1999   "cleaned out arthritis and bone spurs"  . SHOULDER ARTHROSCOPY W/ ROTATOR CUFF REPAIR Left 2005  . THULIUM LASER TURP (TRANSURETHRAL RESECTION OF PROSTATE) N/A 01/21/2017   Procedure: Marcelino Duster LASER TURP (TRANSURETHRAL RESECTION OF PROSTATE) CAUTERIZATION OF BLADDER LESION;  Surgeon: Franchot Gallo, MD;  Location: [facility];  Service: Urology;  Laterality: N/A;   HPI:  Adrian Singh is a 86 y.o. WM PMHx CAD status post stenting, A. 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  Denies any vomiting fever or chills.  Abdominal discomfort is generalized.  Was able to take his medications. CXR 10/22 concerning for LLL pna.  Pt with hx of silent aspiration with MBS 05/04/20  revealing post prandial silent aspiration of all consistencies of liquid.   Assessment / Plan / Recommendation Clinical Impression  Pt with known dysphagia resulting in minimal silent aspiration from pharyngeal retention initiating from his stroke in August 2021.  He also has an esophageal component to his dysphagia - requiring diliatation in 2001.  Pt presents with facial, trigeminal and likely vagus nerve deficits  evidenced by facial asymmetry, decreased facial sensation on the left and vocal hoarseness.  He self limits his diet to very soft foods and takes his pills crushed.  He states foods will stick in his throat and water will sometimes "fill him up" causing esophageal discomfort.  Observation of consumption of cheerios (softened with milk), applesauce and water resulted in continued multiple swallows and throat clearing.  No overt coughing with intake - pt with silent aspiration.  Pt did not appear to have dentures in place during recent MBSS 05/04/20 which likely exacerbated his baseline oral dysphagia.  Given pt awareness of his dysphagia, report of adequate water intake, etc, recommend to allow diet as tolerated.  Note plan for palliative referral given multiple complex medical issues, comorbitidies and multiple admits within the last month. Will follow up x1 to assure education for aspiration mitigation has taken place. SLP Visit Diagnosis: Dysphagia, oropharyngeal phase (R13.12)    Aspiration Risk  Moderate aspiration risk;Severe aspiration risk;Risk for inadequate nutrition/hydration    Diet Recommendation Thin liquid;Regular (to allow pt to choose food items)   Liquid Administration via: Straw;Cup Medication Administration: Other (Comment) (pt wants crushed) Supervision: Staff to assist with self feeding Compensations: Slow rate;Small sips/bites;Clear throat intermittently    Other  Recommendations Oral Care Recommendations: Oral care QID   Follow up Recommendations Skilled Nursing facility;24 hour supervision/assistance      Frequency and Duration min 1 x/week  2 weeks       Prognosis Prognosis for Safe Diet Advancement: Fair      Swallow Study   General Date of Onset: 07/04/20 HPI: [name] is a 86 y.o. WM PMHx CAD status post stenting, A. 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  Denies any vomiting fever or chills.  Abdominal discomfort is generalized.  Was able to take his medications. CXR 10/22 concerning for LLL pna.  Pt with hx of silent aspiration with MBS 05/04/20 revealing post prandial silent aspiration of all consistencies of liquid. Type of Study: Bedside Swallow Evaluation (mbs 04/2020) Previous Swallow Assessment: BSE 07/11/20 - dys3/thin Diet Prior to this Study: Dysphagia 2 (chopped);Thin liquids Respiratory Status: Room air History of Recent Intubation: No Behavior/Cognition: Alert;Cooperative;Pleasant mood Oral Cavity Assessment: Dry Oral Care Completed by SLP: Yes Oral Cavity - Dentition: Edentulous (dentures cleaned and placed with adhesive) Vision: Functional for self-feeding Self-Feeding Abilities: Able to feed self Patient Positioning: Upright in bed Baseline Vocal Quality: Normal;Low vocal intensity Volitional Cough: Weak Volitional Swallow: Able to elicit    Oral/Motor/Sensory Function Overall Oral Motor/Sensory Function: Within functional limits Facial ROM: Reduced left;Suspected CN VII (facial) dysfunction Facial Symmetry: Abnormal symmetry left;Suspected CN VII (facial) dysfunction Facial Strength: Suspected CN  VII (facial) dysfunction Facial Sensation: Suspected CN V (Trigeminal) dysfunction Lingual ROM: Within Functional Limits (reduced protrusion) Lingual Symmetry: Within Functional Limits Lingual Strength: Reduced Lingual Sensation:  (dnt) Velum: Within Functional Limits Mandible: Within Functional  Limits   Ice Chips Ice chips: Not tested   Thin Liquid Thin Liquid: Impaired Presentation: Straw Pharyngeal  Phase Impairments: Multiple swallows;Throat Clearing - Delayed    Nectar Thick Nectar Thick Liquid: Not tested   Honey Thick Honey Thick Liquid: Not tested   Puree Puree: Impaired Presentation: Spoon Pharyngeal Phase Impairments: Multiple swallows   Solid     Solid: Impaired Oral Phase Impairments: Impaired mastication Oral Phase Functional Implications: Prolonged oral transit Pharyngeal Phase Impairments: Multiple swallows Other Comments: cheerios that were allowed to soften      Macario Golds 07/21/2020,10:54 AM Kathleen Lime, MS [place] Office [phone] Pager [phone]

## 2020-07-22 DIAGNOSIS — I5032 Chronic diastolic (congestive) heart failure: Secondary | ICD-10-CM

## 2020-07-22 LAB — CULTURE, BLOOD (ROUTINE X 2)
Culture: NO GROWTH
Culture: NO GROWTH
Special Requests: ADEQUATE
Special Requests: ADEQUATE

## 2020-07-22 LAB — SARS CORONAVIRUS 2 BY RT PCR (HOSPITAL ORDER, PERFORMED IN ~~LOC~~ HOSPITAL LAB): SARS Coronavirus 2: NEGATIVE

## 2020-07-22 MED ORDER — TRAMADOL HCL 50 MG PO TABS
50.0000 mg | ORAL_TABLET | Freq: Four times a day (QID) | ORAL | 0 refills | Status: DC | PRN
Start: 2020-07-22 — End: 2020-08-20

## 2020-07-22 NOTE — Discharge Summary (Addendum)
Physician Discharge Summary  Adrian Singh ZSW:[id number] DOB: 1934/01/10 DOA: 07/17/2020  PCP: [name], MD  Admit date: 07/17/2020 Discharge date: 07/22/2020  Admitted From: Home  Disposition:  SNF   Recommendations for Outpatient Follow-up and new medication changes:  1. Follow up with Adrian Singh in 7 days.  2. Patient not candidate for further prostate cancer therapy due to severe decline in functional status.   Home Health: na   Equipment/Devices: na    Discharge Condition: stable  CODE STATUS:  Adrian Singh and I spoke about his condition, advance cancer and bone metastasis. I explained that in a case of cardiac arrest resuscitation will be likely not successful, and will produce certainly physical injuries to his chest, lack of oxygen to his brain will lead to brain damage.   He has decided that in case of cardio-respiratory arrest avoid further suffering and allow a natural and peaceful death.  He give permission to change code status to DNR.   Diet recommendation:  Regular as tolerated.   Brief/Interim Summary: Adrian Singh was admitted to the hospital with a workingdiagnosis of dehydration and hypokalemia in the setting ofadvanced prostate cancer.  86 year old male with past medical history of coronary artery disease, diastolic heart failure, paroxysmal atrial fibrillation, type IIdiabetesmellitus, chronic anemia, prostate cancer with bone metastasis and a positive brain mass. Recently admitted to the hospital for pneumonia and uncontrolled fibrillation, he was discharged 10/29. At home he had progressive weakness, poor oral intake and positive mid back pain. Due to rapid and progressive deconditioning he was brought back to the hospital by his family members. On his initial physical examination his blood pressure was 149/68, heart rate 70, respiratory rate 18, temperature 97.8, oxygen saturation 98%,he had temporal wasting, heart S1-S2, irregular, bradycardic,  lungs with no wheezing or rhonchi, abdomen soft, no lower extremity edema.  Sodium 142, potassium 2.1, chloride 105, bicarb 26, glucose 78, BUN 12, creatinine 0.63, albumin 1.9, BNP 1002, troponin I 30, white count 9.0, hemoglobin 8.7, hematocrit 39.5, platelets 125, INR 1.5. SARS COVID-19 negative. Urinalysis negative for infection, specific gravity 1.015 with 30 protein. Chest radiograph with no infiltrates. EKG 80 bpm, left axis deviation, prolonged QTC 581, low voltage, sinus arrhythmia, no ST segment or T wave changes.  Patient received supportive medical therapy with IV fluids and electrolyte supplementation. He was seen by oncology, with no further recommendations for cancer treatment due to poor functional status and advanced disease.  Patient was seen by physical therapy, recommendations to continue care at the skilled nursing facility.  1. Progressive prostate cancer, complicated with dehydration, hypokalemia and hypomagnesemia.  Patient received medical therapy with intravenous fluids, his electrolytes were corrected with potassium chloride and magnesium sulfate. Pain control with morphine and tramadol.  He has advanced cancer with bone metastasis, rapid progressive decline in his functional status, not candidate for further cancer therapy.  Patient will be discharged to skilled nursing facility for physical rehabilitation.  Brain mass was evaluated as an outpatient neuro-oncology, concluded likely nonmalignant lesion.   2.  Severe protein calorie malnutrition, pressure ulcers, present on admission, sacrum unable to stage, mid back stage II.  Continue attritional supplements, out of bed as tolerated, continue physical therapy. Local skin care.  3.  Chronic diastolic heart failure.  His left ventricle ejection fraction is 55%, he had no signs of decompensation during his hospitalization. At discharge resume furosemide as needed 40 mg daily.   4.  Paroxysmal atrial fibrillation,  prolonged QTC.  Amiodarone was discontinued  due to prolonged QT interval, continue metoprolol for rate control.  Patient not candidate for anticoagulation due to frailty and risk of falls.  5. T2DM/ dyslipidemia. It has remained well controlled, fasting glucose 120 and 189.  Continue to hold on glimepiride to prevent hypoglycemia.   Continue with statin therapy.   Discharge Diagnoses:  Principal Problem:   Hypokalemia Active Problems:   PAF (paroxysmal atrial fibrillation) (HCC)   Prostate cancer ([place])   Primary malignant neoplasm of prostate metastatic to bone (HCC)   General weakness   Pressure ulcer of back   Severe protein-calorie malnutrition (HCC)   Failure to thrive in adult   Back pain   Prolonged QT interval   Chronic diastolic CHF (congestive heart failure) ([place])    Discharge Instructions   Allergies as of 07/26/2020      Reactions   Soma [carisoprodol] Other (See Comments)   "did a number on me"   Doxazosin Other (See Comments)   dizziness      Medication List    STOP taking these medications   amiodarone 200 MG tablet Commonly known as: PACERONE   glimepiride 2 MG tablet Commonly known as: Amaryl     TAKE these medications   acetaminophen 325 MG tablet Commonly known as: TYLENOL Take 650 mg by mouth every 12 (twelve) hours as needed for mild pain or moderate pain.   atorvastatin 40 MG tablet Commonly known as: LIPITOR Take 20 mg by mouth daily.   Bayer Microlet Lancets lancets Use to test blood sugar once daily dx: E11.40   Calcium 600 1500 (600 Ca) MG Tabs tablet Generic drug: calcium carbonate Take 600 mg of elemental calcium by mouth daily.   CONTOUR NEXT EZ MONITOR w/Device Kit USE TO TEST BLOOD SUGAR ONCE DAILY. Dx Code E11.40   Contour Next Test test strip Generic drug: glucose blood Check blood sugar once daily and as directed.Dx Code E11.40   cyanocobalamin 1000 MCG tablet Take 1 tablet (1,000 mcg total) by mouth at bedtime.    Ensure Plus Liqd Take 237 mLs by mouth daily as needed (suppliment).   ergocalciferol 1.25 MG (50000 UT) capsule Commonly known as: VITAMIN D2 Take 50,000 Units by mouth once a week. q Saturday   furosemide 40 MG tablet Commonly known as: LASIX Take 40 mg by mouth daily as needed for fluid.   lidocaine 5 % Commonly known as: LIDODERM Place 1 patch onto the skin daily. Remove & Discard patch within 12 hours or as directed by MD Start taking on: July 27, 2020   metoprolol tartrate 50 MG tablet Commonly known as: Lopressor Take 1 tablet (50 mg total) by mouth in the morning, at noon, and at bedtime.   multivitamin with minerals Tabs tablet Take 1 tablet by mouth daily.   nitroGLYCERIN 0.4 MG SL tablet Commonly known as: NITROSTAT Place 1 tablet (0.4 mg total) under the tongue every 5 (five) minutes as needed for chest pain. What changed: additional instructions   polyethylene glycol 17 g packet Commonly known as: MIRALAX / GLYCOLAX Take 17 g by mouth daily as needed for mild constipation.   traMADol 50 MG tablet Commonly known as: ULTRAM Take 1 tablet (50 mg total) by mouth every 6 (six) hours as needed for moderate pain. What changed:   when to take this  reasons to take this   VITAMIN B COMPLEX PO Take 1 tablet by mouth daily.   vitamin C 1000 MG tablet Take 1,000 mg by mouth daily.  Allergies  Allergen Reactions  . Soma [Carisoprodol] Other (See Comments)    "did a number on me"  . Doxazosin Other (See Comments)    dizziness    Consultations:  Oncology    Procedures/Studies: DG Abd 1 View  Result Date: 07/06/2020 CLINICAL DATA:  Abdominal pain EXAM: ABDOMEN - 1 VIEW COMPARISON:  CT abdomen and pelvis 07/05/2020 FINDINGS: Diffuse gaseous distention of colon increased from previous exam. This includes extensive gaseous distension of the sigmoid loop. No definite gas in rectum or bowel wall thickening identified. Increased stool in RIGHT  colon. Small bowel decompressed. Osseous demineralization with degenerative disc/facet disease changes and dextroconvex scoliosis lumbar spine. No urinary tract calcification definitely visualized. Scattered pelvic phleboliths. IMPRESSION: Gaseous distention of colon increased from previous exam, especially sigmoid loop. No evidence of sigmoid volvulus was identified on the prior CT. Findings favor diffuse colonic ileus; consider repeat CT if patient's symptoms persist or worsen. Electronically Signed   By: Lavonia Dana M.D.   On: 07/06/2020 10:36   MR BRAIN W WO CONTRAST  Result Date: 07/12/2020 CLINICAL DATA:  Stroke, follow-up. EXAM: MRI HEAD WITHOUT AND WITH CONTRAST TECHNIQUE: Multiplanar, multiecho pulse sequences of the brain and surrounding structures were obtained without and with intravenous contrast. CONTRAST:  74m GADAVIST GADOBUTROL 1 MMOL/ML IV SOLN COMPARISON:  Head CT 06/18/2020, brain MRI with contrast 05/01/2020. Brain MRI without contrast 05/01/2020. Head CT 04/30/2020. FINDINGS: Brain: Moderate generalized cerebral atrophy. Again demonstrated is a circumscribed focus of T2/FLAIR hyperintense signal abnormality within the posterior right frontal lobe white matter and extending to involve portions of the callosal body on the right. As before, there is associated mass effect with partial effacement of the right lateral ventricle. The lesion is similar to slightly increased in size as compared to the prior MRI of 814 2021, measuring 1.9 x 1.7 x 2.5 cm (AP x TV x CC) on the current exam (previously 1.8 x 1.6 x 2.3) (remeasured on prior). Internal regions of restricted diffusion are more conspicuous as compared to the prior study (series 6, image 38). Additionally, the lesion demonstrates progression of patchy and irregular enhancement. There is a punctate focus of restricted diffusion immediately posterior to the lesion, which may reflect an acute infarct or satellite focus of tumor (series 5,  image 90). Redemonstrated small scattered chronic cortical infarcts within the right frontal and parietal lobes, as well as right temporal occipital lobe. Redemonstrated tiny chronic infarct within the left cerebellar hemisphere. No chronic intracranial blood products. No extra-axial fluid collection. No midline shift. Vascular: Expected proximal arterial flow voids. Skull and upper cervical spine: New from the prior MRI of 04/30/2020, there is extensive abnormal T1 hypointense marrow signal within the visualized upper cervical spine. Sinuses/Orbits: Visualized orbits show no acute finding. Mild scattered paranasal sinus mucosal thickening. Trace fluid within bilateral mastoid air cells. These results will be called to the ordering clinician or representative by the Radiologist Assistant, and communication documented in the PACS or [employer] IMPRESSION: 1.9 x 1.7 x 2.5 cm enhancing lesion within the posterior right frontal lobe white matter with extension into the adjacent callosal body. This lesion is similar to slightly increased in size as compared to the prior brain MRI of 04/30/2020 and demonstrates persistent mass effect with partial effacement of the right lateral ventricle. Additionally, there has been an interval progression of associated irregular enhancement and restricted diffusion. These findings raise strong suspicion for an intracranial neoplasm (i.e. glioma). Neuro-Oncology consultation is recommended. There is a punctate focus  of restricted diffusion immediately posterior to this lesion within the posterior right frontal lobe, which may reflect an acute infarct or possibly a small satellite tumor nodule. Moderate generalized cerebral atrophy. Redemonstrated small chronic cortically based infarcts within the right frontal, parietal and temporal occipital lobes. New from the prior examination, there is extensive abnormal T1 hypointense marrow signal throughout the visualized upper cervical  spine. Primary differential considerations include red marrow reconversion, osseous metastatic disease or a marrow infiltrative process. Electronically Signed   By: Kellie Simmering DO   On: 07/12/2020 12:40   CT ABDOMEN PELVIS W CONTRAST  Result Date: 07/05/2020 CLINICAL DATA:  Suspected bowel obstruction. No bowel movements in 3 days. EXAM: CT ABDOMEN AND PELVIS WITH CONTRAST TECHNIQUE: Multidetector CT imaging of the abdomen and pelvis was performed using the standard protocol following bolus administration of intravenous contrast. CONTRAST:  179m OMNIPAQUE IOHEXOL 300 MG/ML  SOLN COMPARISON:  05/01/2020 FINDINGS: Lower chest: Mild interstitial pattern to the lung bases likely representing fibrosis. Minimal bilateral pleural effusions. Moderate esophageal hiatal hernia. Coronary artery calcifications. Hepatobiliary: No focal liver abnormality is seen. No gallstones, gallbladder wall thickening, or biliary dilatation. Pancreas: Pancreas is atrophic. Low-attenuation lesion in the pancreatic head appears stable. Calcification in the pancreatic head likely indicates chronic pancreatitis. Spleen: Normal in size without focal abnormality. Adrenals/Urinary Tract: Adrenal glands are unremarkable. Kidneys are normal, without renal calculi, focal lesion, or hydronephrosis. Bladder is unremarkable. Stomach/Bowel: Stomach and small bowel are decompressed. Moderately distended gas and fluid-filled colon and rectum. Changes likely to represent ileus. Infectious process would also be possible. No colonic wall thickening to suggest colitis. Appendix is not identified. Vascular/Lymphatic: Aortic atherosclerosis. No enlarged abdominal or pelvic lymph nodes. Reproductive: Prostate is unremarkable. Other: No free air or free fluid in the abdomen. Abdominal wall musculature appears intact. Musculoskeletal: Prominent degenerative changes throughout the spine. Multiple focal and confluent areas of sclerosis in the visualized skeleton  consistent with diffuse bone metastasis. IMPRESSION: 1. Moderately distended gas and fluid-filled colon and rectum likely to represent ileus. Infectious process would also be possible. No evidence of small bowel obstruction. 2. Moderate esophageal hiatal hernia. 3. Minimal bilateral pleural effusions. 4. Diffuse bone metastasis. 5. Stable appearance of low-attenuation lesion in the pancreatic head. 6. Aortic atherosclerosis. 7. Changes of chronic pancreatitis. Aortic Atherosclerosis (ICD10-I70.0). Electronically Signed   By: WLucienne CapersM.D.   On: 07/05/2020 01:22   DG Chest Port 1 View  Result Date: 07/17/2020 CLINICAL DATA:  Cough EXAM: PORTABLE CHEST 1 VIEW COMPARISON:  Chest radiograph dated 07/09/2020 FINDINGS: The heart size and mediastinal contours are within normal limits. Both lungs are clear. The visualized skeletal structures are unremarkable. IMPRESSION: No active disease. Electronically Signed   By: TZerita BoersM.D.   On: 07/17/2020 14:07   DG CHEST PORT 1 VIEW  Result Date: 07/09/2020 CLINICAL DATA:  Shortness of breath EXAM: PORTABLE CHEST 1 VIEW COMPARISON:  Yesterday FINDINGS: Borderline heart size accentuated by rotation and a moderate hiatal hernia. Indistinct opacity at the bases, greater on the left. No Kerley lines, effusion, or pneumothorax. IMPRESSION: Decreased lung volumes with increased presumed atelectasis. Electronically Signed   By: JMonte FantasiaM.D.   On: 07/09/2020 06:56   DG CHEST PORT 1 VIEW  Result Date: 07/08/2020 CLINICAL DATA:  [place]. EXAM: PORTABLE CHEST 1 VIEW COMPARISON:  07/07/2020.  CT 07/05/2020.  Multiple older films. FINDINGS: Heart size is normal. Right lung is clear. Infiltrate/volume loss at the left lung base consistent with pneumonia. No dense consolidation,  lobar collapse or visible effusion by radiography. Small amount of pleural fluid visible on CT of 3 days ago. Moderate size hiatal hernia as seen previously. No acute bone finding.  IMPRESSION: Persistent mild infiltrate/volume loss at the left lung base consistent with pneumonia. Electronically Signed   By: Nelson Chimes M.D.   On: 07/08/2020 08:30   DG CHEST PORT 1 VIEW  Result Date: 07/07/2020 CLINICAL DATA:  Abdominal pain. EXAM: PORTABLE CHEST 1 VIEW COMPARISON:  July 06, 2020. FINDINGS: The heart size and mediastinal contours are within normal limits. Both lungs are clear. Stable elevated left hemidiaphragm is noted. No pneumothorax or pleural effusion is noted. The visualized skeletal structures are unremarkable. IMPRESSION: No active disease. Electronically Signed   By: Marijo Conception M.D.   On: 07/07/2020 19:32   DG CHEST PORT 1 VIEW  Result Date: 07/06/2020 CLINICAL DATA:  Chest pain. EXAM: PORTABLE CHEST 1 VIEW COMPARISON:  July 04, 2020 FINDINGS: Low lung volumes are noted. There is no evidence of acute infiltrate, pleural effusion or pneumothorax. The heart size and mediastinal contours are within normal limits. The visualized skeletal structures are unremarkable. IMPRESSION: No active disease. Electronically Signed   By: Virgina Norfolk M.D.   On: 07/06/2020 20:10   DG Chest Port 1 View  Result Date: 07/04/2020 CLINICAL DATA:  Shortness of breath EXAM: PORTABLE CHEST 1 VIEW COMPARISON:  06/18/2020 FINDINGS: The heart size and mediastinal contours are within normal limits. Both lungs are clear. The visualized skeletal structures are unremarkable. IMPRESSION: No active disease. Electronically Signed   By: Inez Catalina M.D.   On: 07/04/2020 23:15   DG Abd Portable 1V  Result Date: 07/07/2020 CLINICAL DATA:  Diffuse abdominal pain, abdominal distension EXAM: PORTABLE ABDOMEN - 1 VIEW COMPARISON:  07/06/2020 FINDINGS: The colon rectum appears diffusely gas-filled with the cecum dilated to approximately 10.1 cm, similar to that noted on prior examination. The findings are suggestive of a colonic ileus. Small amount of stool noted within the cecum. No gross  free intraperitoneal gas. Foley catheter balloon overlies the mid pelvis. IMPRESSION: Stable diffuse gaseous distension of the colon suggestive of a colonic ileus. Electronically Signed   By: Fidela Salisbury MD   On: 07/07/2020 09:45   ECHOCARDIOGRAM COMPLETE  Result Date: 07/20/2020    ECHOCARDIOGRAM REPORT   Patient Name:   Jonta KALVIN BUSS Date of Exam: 07/20/2020 Medical Rec #:  [id number]      Height:       70.0 in Accession #:    [id number]     Weight:       167.0 lb Date of Birth:  13-Aug-1934     BSA:          1.933 m Patient Age:    50 years       BP:           120/61 mmHg Patient Gender: M              HR:           65 bpm. Exam Location:  Inpatient Procedure: 2D Echo, Cardiac Doppler and Color Doppler Indications:    I50.23 Acute on chronic systolic (congestive) heart failure  History:        Patient has prior history of Echocardiogram examinations, most                 recent 07/07/2020. CAD, Stroke, Arrythmias:Atrial Fibrillation;  Risk Factors:Hypertension and Diabetes. Cancer. GERD.  Sonographer:    Jonelle Sidle Dance Referring Phys: [id number] [place] Comments: No subcostal window and suboptimal parasternal window. Image acquisition challenging due to respiratory motion. IMPRESSIONS  1. Left ventricular ejection fraction, by estimation, is 55%. The left ventricle has normal function. The left ventricle demonstrates regional wall motion abnormalities (see scoring diagram/findings for description). There is mild left ventricular hypertrophy. Left ventricular diastolic parameters are consistent with Grade II diastolic dysfunction (pseudonormalization). Elevated left ventricular end-diastolic pressure.  2. Right ventricular systolic function is normal. The right ventricular size is normal.  3. Left atrial size was severely dilated.  4. The mitral valve is grossly normal. Mild mitral valve regurgitation. No evidence of mitral stenosis.  5. The aortic valve is grossly normal.  Aortic valve regurgitation is trivial. No aortic stenosis is present. FINDINGS  Left Ventricle: Left ventricular ejection fraction, by estimation, is 55%. The left ventricle has normal function. The left ventricle demonstrates regional wall motion abnormalities. The left ventricular internal cavity size was normal in size. There is  mild left ventricular hypertrophy. Left ventricular diastolic parameters are consistent with Grade II diastolic dysfunction (pseudonormalization). Elevated left ventricular end-diastolic pressure. The E/e' is 15.  LV Wall Scoring: The mid and distal inferior wall and apical lateral segment are hypokinetic. Right Ventricle: The right ventricular size is normal. No increase in right ventricular wall thickness. Right ventricular systolic function is normal. Left Atrium: Left atrial size was severely dilated. Right Atrium: Right atrial size was normal in size. Pericardium: There is no evidence of pericardial effusion. Mitral Valve: The mitral valve is grossly normal. Mild mitral valve regurgitation. No evidence of mitral valve stenosis. Tricuspid Valve: The tricuspid valve is normal in structure. Tricuspid valve regurgitation is mild. Aortic Valve: The aortic valve is grossly normal. Aortic valve regurgitation is trivial. No aortic stenosis is present. Pulmonic Valve: The pulmonic valve was not well visualized. Aorta: The aortic root and ascending aorta are structurally normal, with no evidence of dilitation. IAS/Shunts: The interatrial septum was not well visualized.  LEFT VENTRICLE PLAX 2D LVIDd:         4.90 cm  Diastology LVIDs:         3.70 cm  LV e' medial:    5.33 cm/s LV PW:         1.30 cm  LV E/e' medial:  15.1 LV IVS:        1.00 cm  LV e' lateral:   9.46 cm/s LVOT diam:     2.40 cm  LV E/e' lateral: 8.5 LV SV:         102 LV SV Index:   53 LVOT Area:     4.52 cm  RIGHT VENTRICLE RV Basal diam:  2.60 cm RV S prime:     11.10 cm/s TAPSE (M-mode): 1.9 cm LEFT ATRIUM               Index       RIGHT ATRIUM           Index LA diam:        5.60 cm  2.90 cm/m  RA Area:     12.10 cm LA Vol (A2C):   101.0 ml 52.24 ml/m RA Volume:   25.90 ml  13.40 ml/m LA Vol (A4C):   84.8 ml  43.86 ml/m LA Biplane Vol: 95.1 ml  49.19 ml/m  AORTIC VALVE LVOT Vmax:   105.00 cm/s LVOT Vmean:  68.950 cm/s LVOT  VTI:    0.224 m  AORTA Ao Root diam: 3.50 cm Ao Asc diam:  3.80 cm MITRAL VALVE               TRICUSPID VALVE MV Area (PHT): 2.37 cm    TR Peak grad:   22.1 mmHg MV Decel Time: 320 msec    TR Vmax:        235.00 cm/s MV E velocity: 80.50 cm/s MV A velocity: 74.40 cm/s  SHUNTS MV E/A ratio:  1.08        Systemic VTI:  0.22 m                            Systemic Diam: 2.40 cm Cherlynn Kaiser MD Electronically signed by Cherlynn Kaiser MD Signature Date/Time: 07/20/2020/9:50:05 AM    Final    ECHOCARDIOGRAM COMPLETE  Result Date: 07/08/2020    ECHOCARDIOGRAM REPORT   Patient Name:   Orel Cherylynn Ridges Date of Exam: 07/07/2020 Medical Rec #:  [id number]      Height:       71.0 in Accession #:    [id number]     Weight:       151.0 lb Date of Birth:  1934/09/01     BSA:          1.871 m Patient Age:    64 years       BP:           149/79 mmHg Patient Gender: M              HR:           71 bpm. Exam Location:  Inpatient Procedure: 2D Echo, Cardiac Doppler and Color Doppler Indications:    Elevated Troponin  History:        Patient has prior history of Echocardiogram examinations, most                 recent 05/01/2020. CAD, Stroke, Arrythmias:Atrial Fibrillation;                 Risk Factors:Hypertension, Diabetes and GERD. Cancer.  Sonographer:    Jonelle Sidle Dance Referring Phys: [phone] A CALDWELL POWELL JR  Sonographer Comments: Suboptimal subcostal window. IMPRESSIONS  1. Since the last study on 05/01/2020 LVEF has decreased 50-55% to 40-45% with diffuse hypokinesis and akinesis in the basal and mid inferior and inferolateral walls.  2. Left ventricular ejection fraction, by estimation, is 40 to 45%. The left  ventricle has mildly decreased function. The left ventricle demonstrates regional wall motion abnormalities (see scoring diagram/findings for description). There is mild concentric left ventricular hypertrophy. Left ventricular diastolic parameters are consistent with Grade I diastolic dysfunction (impaired relaxation).  3. Right ventricular systolic function is normal. The right ventricular size is normal. There is normal pulmonary artery systolic pressure.  4. Left atrial size was mildly dilated.  5. The mitral valve is normal in structure. Mild mitral valve regurgitation. No evidence of mitral stenosis.  6. The aortic valve is normal in structure. Aortic valve regurgitation is not visualized. No aortic stenosis is present.  7. The inferior vena cava is normal in size with greater than 50% respiratory variability, suggesting right atrial pressure of 3 mmHg. FINDINGS  Left Ventricle: Left ventricular ejection fraction, by estimation, is 40 to 45%. The left ventricle has mildly decreased function. The left ventricle demonstrates regional wall motion abnormalities. The left ventricular internal cavity size was normal in size. There  is mild concentric left ventricular hypertrophy. Left ventricular diastolic parameters are consistent with Grade I diastolic dysfunction (impaired relaxation). Normal left ventricular filling pressure.  LV Wall Scoring: The inferior wall, posterior wall, and basal inferoseptal segment are akinetic. Right Ventricle: The right ventricular size is normal. No increase in right ventricular wall thickness. Right ventricular systolic function is normal. There is normal pulmonary artery systolic pressure. The tricuspid regurgitant velocity is 2.33 m/s, and  with an assumed right atrial pressure of 8 mmHg, the estimated right ventricular systolic pressure is 57.3 mmHg. Left Atrium: Left atrial size was mildly dilated. Right Atrium: Right atrial size was normal in size. Pericardium: There is no  evidence of pericardial effusion. Mitral Valve: The mitral valve is normal in structure. Mild mitral valve regurgitation. No evidence of mitral valve stenosis. Tricuspid Valve: The tricuspid valve is normal in structure. Tricuspid valve regurgitation is not demonstrated. No evidence of tricuspid stenosis. Aortic Valve: The aortic valve is normal in structure. Aortic valve regurgitation is not visualized. No aortic stenosis is present. Pulmonic Valve: The pulmonic valve was normal in structure. Pulmonic valve regurgitation is not visualized. No evidence of pulmonic stenosis. Aorta: The aortic root is normal in size and structure. Venous: The inferior vena cava is normal in size with greater than 50% respiratory variability, suggesting right atrial pressure of 3 mmHg. IAS/Shunts: No atrial level shunt detected by color flow Doppler.  LEFT VENTRICLE PLAX 2D LVIDd:         4.70 cm LVIDs:         2.90 cm LV PW:         1.20 cm LV IVS:        1.10 cm LVOT diam:     2.20 cm LV SV:         70 LV SV Index:   37 LVOT Area:     3.80 cm  RIGHT VENTRICLE RV Basal diam:  1.90 cm TAPSE (M-mode): 1.9 cm LEFT ATRIUM             Index       RIGHT ATRIUM           Index LA diam:        3.90 cm 2.08 cm/m  RA Area:     11.90 cm LA Vol (A2C):   82.1 ml 43.87 ml/m RA Volume:   24.00 ml  12.83 ml/m LA Vol (A4C):   57.7 ml 30.84 ml/m LA Biplane Vol: 69.9 ml 37.35 ml/m  AORTIC VALVE LVOT Vmax:   100.25 cm/s LVOT Vmean:  61.700 cm/s LVOT VTI:    0.184 m  AORTA Ao Root diam: 3.60 cm Ao Asc diam:  3.10 cm MITRAL VALVE               TRICUSPID VALVE MV Area (PHT): 2.32 cm    TR Peak grad:   21.7 mmHg MV Decel Time: 327 msec    TR Vmax:        233.00 cm/s MV E velocity: 61.60 cm/s MV A velocity: 81.90 cm/s  SHUNTS MV E/A ratio:  0.75        Systemic VTI:  0.18 m                            Systemic Diam: 2.20 cm Ena Dawley MD Electronically signed by Ena Dawley MD Signature Date/Time: 07/08/2020/9:03:41 AM    Final         Subjective: Patient  is feeling better, but continue to be very weak and deconditioned, no nausea or vomiting.   Discharge Exam: Vitals:   07/21/20 2045 07/22/20 0618  BP: (!) 157/67 (!) 164/69  Pulse: 61 62  Resp: 17 17  Temp: 97.9 F (36.6 C) 97.8 F (36.6 C)  SpO2: 98% 99%   Vitals:   07/21/20 0552 07/21/20 1316 07/21/20 2045 07/22/20 0618  BP: (!) 169/64 (!) 114/58 (!) 157/67 (!) 164/69  Pulse: 61 (!) 58 61 62  Resp: '16 14 17 17  ' Temp: (!) 97.5 F (36.4 C) (!) 97.5 F (36.4 C) 97.9 F (36.6 C) 97.8 F (36.6 C)  TempSrc: Oral Oral Oral Oral  SpO2: 99% 100% 98% 99%  Weight:      Height:        General: Not in pain or dyspnea.  Neurology: Awake and alert, non focal  E ENT: mild pallor, no icterus, oral mucosa moist Cardiovascular: No JVD. S1-S2 present, rhythmic, no gallops, rubs, or murmurs. No lower extremity edema. Pulmonary: positive breath sounds bilaterally with no wheezing, rhonchi or rales. Gastrointestinal. Abdomen soft and non tender Skin. No rashes Musculoskeletal: very low muscle mass lower extremities.    The results of significant diagnostics from this hospitalization (including imaging, microbiology, ancillary and laboratory) are listed below for reference.     Microbiology: Recent Results (from the past 240 hour(s))  Culture, blood (routine x 2)     Status: None   Collection Time: 07/17/20  1:26 PM   Specimen: BLOOD  Result Value Ref Range Status   Specimen Description   Final    BLOOD BLOOD RIGHT FOREARM Performed at [facility] [address], [place], [place] [address]    Special Requests   Final    BOTTLES DRAWN AEROBIC AND ANAEROBIC Blood Culture adequate volume Performed at [facility] [address], [place], [place] [address]    Culture   Final    NO GROWTH 5 DAYS Performed at [facility], [place] [address], [place], [place] [address]    Report Status 07/22/2020 FINAL  Final   Culture, blood (routine x 2)     Status: None   Collection Time: 07/17/20  1:31 PM   Specimen: BLOOD  Result Value Ref Range Status   Specimen Description   Final    BLOOD LEFT ANTECUBITAL Performed at [facility] [address] [place], [place] [address]    Special Requests   Final    BOTTLES DRAWN AEROBIC AND ANAEROBIC Blood Culture adequate volume Performed at [facility] [address], [place], [name] [address]    Culture   Final    NO GROWTH 5 DAYS Performed at [facility], [place] [address], [place], [place] [address]    Report Status 07/22/2020 FINAL  Final  Urine culture     Status: None   Collection Time: 07/17/20  1:55 PM   Specimen: Urine, Clean Catch  Result Value Ref Range Status   Specimen Description   Final    URINE, CLEAN CATCH Performed at [facility], [place] [address], [place], [place] [address]    Special Requests   Final    NONE Performed at [facility], [place] [address], [place], [place] [address]    Culture   Final    NO GROWTH Performed at [facility], [place] [address], [place],  [address]    Report Status 07/18/2020 FINAL  Final  Respiratory Panel by RT PCR (Flu A&B, Covid) - Nasopharyngeal Swab     Status: None   Collection Time: 07/17/20  1:55 PM   Specimen: Nasopharyngeal Swab  Result Value Ref Range Status   SARS Coronavirus 2 by RT PCR NEGATIVE NEGATIVE Final    Comment: (NOTE) SARS-CoV-2 target nucleic acids are NOT DETECTED.  The SARS-CoV-2 RNA is generally detectable in upper respiratoy specimens during the acute phase of infection. The lowest concentration of SARS-CoV-2 viral copies this assay can detect is 131 copies/mL. A negative result does not preclude SARS-Cov-2 infection and should not be used as the sole basis for treatment or other patient management decisions. A negative result may occur with  improper specimen  collection/handling, submission of specimen other than nasopharyngeal swab, presence of viral mutation(s) within the areas targeted by this assay, and inadequate number of viral copies (<131 copies/mL). A negative result must be combined with clinical observations, patient history, and epidemiological information. The expected result is Negative.  Fact Sheet for Patients:  PinkCheek.be  Fact Sheet for Healthcare Providers:  GravelBags.it  This test is no t yet approved or cleared by the [place] FDA and  has been authorized for detection and/or diagnosis of SARS-CoV-2 by FDA under an Emergency Use Authorization (EUA). This EUA will remain  in effect (meaning this test can be used) for the duration of the COVID-19 declaration under Section 564(b)(1) of the Act, 21 U.S.C. section 360bbb-3(b)(1), unless the authorization is terminated or revoked sooner.     Influenza A by PCR NEGATIVE NEGATIVE Final   Influenza B by PCR NEGATIVE NEGATIVE Final    Comment: (NOTE) The Xpert Xpress SARS-CoV-2/FLU/RSV assay is intended as an aid in  the diagnosis of influenza from Nasopharyngeal swab specimens and  should not be used as a sole basis for treatment. Nasal washings and  aspirates are unacceptable for Xpert Xpress SARS-CoV-2/FLU/RSV  testing.  Fact Sheet for Patients: PinkCheek.be  Fact Sheet for Healthcare Providers: GravelBags.it  This test is not yet approved or cleared by the [place] FDA and  has been authorized for detection and/or diagnosis of SARS-CoV-2 by  FDA under an Emergency Use Authorization (EUA). This EUA will remain  in effect (meaning this test can be used) for the duration of the  Covid-19 declaration under Section 564(b)(1) of the Act, 21  U.S.C. section 360bbb-3(b)(1), unless the authorization is  terminated or revoked. Performed at [facility], [place] [address], [place], [place] [address]      Labs: BNP (last 3 results) Recent Labs    07/17/20 1355  BNP 8,875.7*   Basic Metabolic Panel: Recent Labs  Lab 07/18/20 0457 07/18/20 1637 07/19/20 1341 07/20/20 0531 07/21/20 0536  NA 143 140 138 139 139  K 2.3* 3.7 2.3* 2.5* 4.2  CL 105 105 100 103 104  CO2 '29 24 29 27 28  ' GLUCOSE 100* 89 90 120* 189*  BUN '11 10 10 8 ' 7*  CREATININE 0.64 0.57* 0.62 0.57* 0.58*  CALCIUM 7.8* 7.6* 7.4* 7.4* 7.8*  MG 1.8  --  1.7 1.9 2.2  PHOS  --   --  2.6 2.4*  --    Liver Function Tests: Recent Labs  Lab 07/17/20 1355 07/19/20 1341 07/20/20 0531  AST 32 42* 37  ALT '13 12 13  ' ALKPHOS 466* 553* 565*  BILITOT 0.6 0.9 0.8  PROT 6.1* 5.9* 5.7*  ALBUMIN 1.9* 2.0* 2.0*   No results for input(s): LIPASE, AMYLASE  in the last 168 hours. No results for input(s): AMMONIA in the last 168 hours. CBC: Recent Labs  Lab 07/17/20 1355 07/18/20 0457 07/19/20 1341 07/20/20 0531  WBC 9.0 8.1 7.4 7.1  NEUTROABS 5.9  --  5.2 4.8  HGB 8.7* 8.8* 8.0* 8.5*  HCT 29.5* 29.6* 27.1* 28.4*  MCV 81.9 83.1 82.1 83.0  PLT 125* 114* 101* 93*   Cardiac Enzymes: No results for input(s): CKTOTAL, CKMB, CKMBINDEX, TROPONINI in the last 168 hours. BNP: Invalid input(s): POCBNP CBG: No results for input(s): GLUCAP in the last 168 hours. D-Dimer No results for input(s): DDIMER in the last 72 hours. Hgb A1c No results for input(s): HGBA1C in the last 72 hours. Lipid Profile No results for input(s): CHOL, HDL, LDLCALC, TRIG, CHOLHDL, LDLDIRECT in the last 72 hours. Thyroid function studies No results for input(s): TSH, T4TOTAL, T3FREE, THYROIDAB in the last 72 hours.  Invalid input(s): FREET3 Anemia work up No results for input(s): VITAMINB12, FOLATE, FERRITIN, TIBC, IRON, RETICCTPCT in the last 72 hours. Urinalysis    Component Value Date/Time   COLORURINE YELLOW 07/17/2020 [place] 07/17/2020 1355    LABSPEC 1.015 07/17/2020 1355   PHURINE 6.0 07/17/2020 1355   GLUCOSEU NEGATIVE 07/17/2020 1355   HGBUR NEGATIVE 07/17/2020 [place] 07/17/2020 1355   BILIRUBINUR negative 03/18/2014 1014   KETONESUR 5 (A) 07/17/2020 1355   PROTEINUR 30 (A) 07/17/2020 1355   UROBILINOGEN 1.0 03/31/2014 1228   NITRITE NEGATIVE 07/17/2020 1355   LEUKOCYTESUR NEGATIVE 07/17/2020 1355   Sepsis Labs Invalid input(s): PROCALCITONIN,  WBC,  LACTICIDVEN Microbiology Recent Results (from the past 240 hour(s))  Culture, blood (routine x 2)     Status: None   Collection Time: 07/17/20  1:26 PM   Specimen: BLOOD  Result Value Ref Range Status   Specimen Description   Final    BLOOD BLOOD RIGHT FOREARM Performed at [facility] [address] [place], [place] [address]    Special Requests   Final    BOTTLES DRAWN AEROBIC AND ANAEROBIC Blood Culture adequate volume Performed at [facility] [address], [place], [place] [address]    Culture   Final    NO GROWTH 5 DAYS Performed at [facility], [place] [address] [place], [place] [address]    Report Status 07/22/2020 FINAL  Final  Culture, blood (routine x 2)     Status: None   Collection Time: 07/17/20  1:31 PM   Specimen: BLOOD  Result Value Ref Range Status   Specimen Description   Final    BLOOD LEFT ANTECUBITAL Performed at [facility] [address], [place], [place] [address]    Special Requests   Final    BOTTLES DRAWN AEROBIC AND ANAEROBIC Blood Culture adequate volume Performed at [facility] [address], [place], [place] [address]    Culture   Final    NO GROWTH 5 DAYS Performed at [facility], [place] [address], [place], [place] [address]    Report Status 07/22/2020 FINAL  Final  Urine culture     Status: None   Collection Time: 07/17/20  1:55 PM   Specimen: Urine, Clean Catch  Result Value Ref Range Status   Specimen  Description   Final    URINE, CLEAN CATCH Performed at [facility], [place] [address] [place], [place] [address]    Special Requests   Final  NONE Performed at [facility], [place] [address], [place], [place] [address]    Culture   Final    NO GROWTH Performed at [facility], [place] [address], [place], [place] [address]    Report Status 07/18/2020 FINAL  Final  Respiratory Panel by RT PCR (Flu A&B, Covid) - Nasopharyngeal Swab     Status: None   Collection Time: 07/17/20  1:55 PM   Specimen: Nasopharyngeal Swab  Result Value Ref Range Status   SARS Coronavirus 2 by RT PCR NEGATIVE NEGATIVE Final    Comment: (NOTE) SARS-CoV-2 target nucleic acids are NOT DETECTED.  The SARS-CoV-2 RNA is generally detectable in upper respiratoy specimens during the acute phase of infection. The lowest concentration of SARS-CoV-2 viral copies this assay can detect is 131 copies/mL. A negative result does not preclude SARS-Cov-2 infection and should not be used as the sole basis for treatment or other patient management decisions. A negative result may occur with  improper specimen collection/handling, submission of specimen other than nasopharyngeal swab, presence of viral mutation(s) within the areas targeted by this assay, and inadequate number of viral copies (<131 copies/mL). A negative result must be combined with clinical observations, patient history, and epidemiological information. The expected result is Negative.  Fact Sheet for Patients:  PinkCheek.be  Fact Sheet for Healthcare Providers:  GravelBags.it  This test is no t yet approved or cleared by the [place] FDA and  has been authorized for detection and/or diagnosis of SARS-CoV-2 by FDA under an Emergency Use Authorization (EUA). This EUA will remain  in effect (meaning this test can be used) for the duration of the COVID-19  declaration under Section 564(b)(1) of the Act, 21 U.S.C. section 360bbb-3(b)(1), unless the authorization is terminated or revoked sooner.     Influenza A by PCR NEGATIVE NEGATIVE Final   Influenza B by PCR NEGATIVE NEGATIVE Final    Comment: (NOTE) The Xpert Xpress SARS-CoV-2/FLU/RSV assay is intended as an aid in  the diagnosis of influenza from Nasopharyngeal swab specimens and  should not be used as a sole basis for treatment. Nasal washings and  aspirates are unacceptable for Xpert Xpress SARS-CoV-2/FLU/RSV  testing.  Fact Sheet for Patients: PinkCheek.be  Fact Sheet for Healthcare Providers: GravelBags.it  This test is not yet approved or cleared by the [place] FDA and  has been authorized for detection and/or diagnosis of SARS-CoV-2 by  FDA under an Emergency Use Authorization (EUA). This EUA will remain  in effect (meaning this test can be used) for the duration of the  Covid-19 declaration under Section 564(b)(1) of the Act, 21  U.S.C. section 360bbb-3(b)(1), unless the authorization is  terminated or revoked. Performed at [facility], [place] [address] [place], [place] [address]      Time coordinating discharge: 45 minutes  SIGNED:   Tawni Millers, MD  [facility] 07/22/2020, 10:53 AM

## 2020-07-22 NOTE — Progress Notes (Signed)
Physical Therapy Treatment Patient Details Name: Adrian Singh MRN: [id number] DOB: 10-Dec-1933 Today's Date: 07/22/2020    History of Present Illness Pt is 86 y.o. WM PMHx CAD status post stenting, A. 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 Multiple recent admissions. (admitted to the hospital 9/23--9/27 orthostatic hypotension), (admitted hospital 10 2--10/ 8 orthostatic hypotension). admitted 10/18-10/29/21 with Ileus. Now admitted with FTT.    PT Comments    Performed bed and sitting exercises prior to attempting transfer which seemed to help with orthostatic hypotension (did have decrease but asymptomatic).  Pt with good participation with exercises requiring cues for technique and education on new trunk exercises.  Required max - total assist to stand with STEDY and fatigued easily.  Continue progress as able.   Orthostatic BPs  Supine 156/74  Sitting 122/70     Standing-on STEDY seat 114/53         Follow Up Recommendations  SNF (denied by insurance but family appealing)     Financial risk analyst cushion (measurements PT);Wheelchair (measurements PT);Hospital bed;Other (comment) (and hoyer ; with max HH services if has to return home)    Recommendations for Other Services       Precautions / Restrictions Precautions Precautions: Fall Precaution Comments: Hx of orthostatic hypotension, Loose BMs    Mobility  Bed Mobility Overal bed mobility: Needs Assistance Bed Mobility: Rolling;Sidelying to Sit Rolling: Mod assist Sidelying to sit: Mod assist       General bed mobility comments: Pt cued for sequencing and was able to get legs off bed but required mod A to lift trunk.  Cues for weight shifting to scoot forward. Increased time but scooted forward with min A  Transfers Overall  transfer level: Needs assistance   Transfers: Sit to/from Stand Sit to Stand: Max assist;+2 physical assistance;Total assist         General transfer comment: Pt requiring max A to stand and pt's daughter flipped Stedy pads in place.  Sit to stand from elevated bed with Max A and from [facility] with Total.  Ambulation/Gait                 Stairs             Wheelchair Mobility    Modified Rankin (Stroke Patients Only)       Balance Overall balance assessment: Needs assistance Sitting-balance support: Single extremity supported;Feet supported Sitting balance-Leahy Scale: Poor Sitting balance - Comments: Pt requiring support from 1 UE but was able to maintain sitting and perform exercises     Standing balance-Leahy Scale: Zero                              Cognition Arousal/Alertness: Awake/alert Behavior During Therapy: WFL for tasks assessed/performed Overall Cognitive Status: Within Functional Limits for tasks assessed                                        Exercises General Exercises - Lower Extremity Ankle Circles/Pumps: AROM;Both;10 reps;Supine Long Arc Quad: AROM;Both;10 reps;Seated Heel Slides: AROM;Both;10 reps;Supine Hip ABduction/ADduction: AAROM;Both;10 reps;Supine Hip Flexion/Marching: AROM;Both;10 reps;Supine Other Exercises Other Exercises: ABD pillow squeeze in supine x 10 Other Exercises: In chair : trunk "crunches" by reclining and retur to edge  of chair and lateral "crunches" by leaning side to side and returning to center x 5 each    General Comments General comments (skin integrity, edema, etc.): During therapy CSW in and reports insurance has denied peer to peer due to pt has been to SNF with little improvement.  Daughter and pt upset and plan to appeal.  They report pt got very little therapy at SNF.  PT encouraged activity with therapy, AROM on hip own , educated on exercises.  Also, educated on options  including long term care vs hoyer and max HH services.      Pertinent Vitals/Pain Pain Assessment: No/denies pain    Home Living                      Prior Function            PT Goals (current goals can now be found in the care plan section) Acute Rehab PT Goals Patient Stated Goal: wants to do short term rehab to get stronger PT Goal Formulation: With patient/family Time For Goal Achievement: 08/03/20 Potential to Achieve Goals: Fair Progress towards PT goals: Progressing toward goals    Frequency    Min 3X/week      PT Plan Frequency needs to be updated    Co-evaluation              AM-PAC PT "6 Clicks" Mobility   Outcome Measure  Help needed turning from your back to your side while in a flat bed without using bedrails?: A Lot Help needed moving from lying on your back to sitting on the side of a flat bed without using bedrails?: A Lot Help needed moving to and from a bed to a chair (including a wheelchair)?: Total Help needed standing up from a chair using your arms (e.g., wheelchair or bedside chair)?: Total Help needed to walk in hospital room?: Total Help needed climbing 3-5 steps with a railing? : Total 6 Click Score: 8    End of Session Equipment Utilized During Treatment: Gait belt Activity Tolerance: Patient tolerated treatment well Patient left: in chair;with call bell/phone within reach;with family/visitor present (left with STEDY blocking knees b/c pt felt more secure (RN aware) and wanted to sit upright) Nurse Communication: Mobility status;Need for lift equipment (Heavy +2 w STEDY vs Maxi MOVE) PT Visit Diagnosis: History of falling (Z91.81);Muscle weakness (generalized) (M62.81);Unsteadiness on feet (R26.81);Difficulty in walking, not elsewhere classified (R26.2);Dizziness and giddiness (R42)     Time: 1555-1630 PT Time Calculation (min) (ACUTE ONLY): 35 min  Charges:  $Therapeutic Exercise: 8-22 mins $Therapeutic Activity: 8-22  mins                     [name], PT Acute Rehab Services Pager [phone] [facility] [place] 07/22/2020, 4:56 PM

## 2020-07-22 NOTE — TOC Progression Note (Addendum)
Transition of Care [facility]) - Progression Note    Patient Details  Name: Adrian Singh MRN: [id number] Date of Birth: 06-11-1934  [facility] CM/SW Contact  Servando Snare, [place] Phone Number: 07/22/2020, 3:10 PM  Clinical Narrative:   Insurance has requested a peer to peer. LCSW notified attending of peer to peer info via secure chat. LCSW will update facility and family. TOC will continue to follow for dc needs.   4:04 PM Patient denied by insurance after peer to peer. Patient has been to SNF several times with lack of improvement. LCSW provided family with appeal number. Family is appealing decision. LCSW explained home health options.   Expected Discharge Plan: [place] Barriers to Discharge: Ship broker, Other (comment)  Expected Discharge Plan and Services Expected Discharge Plan: [place]   Discharge Planning Services: CM Consult Post Acute Care Choice: [place] Living arrangements for the past 2 months: [place] Expected Discharge Date: 07/22/20                                     Social Determinants of Health (SDOH) Interventions    Readmission Risk Interventions Readmission Risk Prevention Plan 07/19/2020 06/20/2020 06/11/2020  Transportation Screening Complete Complete Complete  Medication Review Press photographer) Complete Complete Complete  PCP or Specialist appointment within 3-5 days of discharge Complete Complete -  [place] or Home Care Consult Complete Complete -  SW Recovery Care/Counseling Consult Complete Complete Complete  Palliative Care Screening Complete Not Applicable Not [place] - Complete Complete  Some recent data might be hidden

## 2020-07-23 NOTE — Progress Notes (Signed)
PROGRESS NOTE    LERONE ONDER  TDS:[id number] DOB: 12/06/1933 DOA: 07/17/2020 PCP: [name], MD    Brief Narrative:  Mr. Sloop was admitted to the hospital with a workingdiagnosis of dehydration and hypokalemia in the setting ofadvanced prostate cancer.  86 year old male with past medical history of coronary artery disease, diastolic heart failure, paroxysmal atrial fibrillation, type IIdiabetesmellitus, chronic anemia, prostate cancer with bone metastasis and a positive brain mass. Recently admitted to the hospital for pneumonia and uncontrolled fibrillation, he was discharged 10/29. At home he had progressive weakness, poor oral intake and positive mid back pain. Due to rapid and progressive deconditioning he was brought back to the hospital by his family members. On his initial physical examination his blood pressure was 149/68, heart rate 70, respiratory rate 18, temperature 97.8, oxygen saturation 98%,he had temporal wasting, heart S1-S2, irregular, bradycardic, lungs with no wheezing or rhonchi, abdomen soft, no lower extremity edema.  Sodium 142, potassium 2.1, chloride 105, bicarb 26, glucose 78, BUN 12, creatinine 0.63, albumin 1.9, BNP 1002, troponin I 30, white count 9.0, hemoglobin 8.7, hematocrit 39.5, platelets 125, INR 1.5. SARS COVID-19 negative. Urinalysis negative for infection, specific gravity 1.015 with 30 protein. Chest radiograph with no infiltrates. EKG 80 bpm, left axis deviation, prolonged QTC 581, low voltage, sinus arrhythmia, no ST segment or T wave changes.  Patient received supportive medical therapy with IV fluids and electrolyte supplementation. He was seen by oncology, with no further recommendations for cancer treatment due to poor functional status and advanced disease.  Patient was seen by physical therapy, recommendations to continue care at the skilled nursing facility.  Insurance has declined SNF, his family has appealed this  decision. Pending placement.    Assessment & Plan:   Principal Problem:   Hypokalemia Active Problems:   PAF (paroxysmal atrial fibrillation) (HCC)   Prostate cancer ([place])   Primary malignant neoplasm of prostate metastatic to bone (HCC)   General weakness   Pressure ulcer of back   Severe protein-calorie malnutrition (HCC)   Failure to thrive in adult   Back pain   Prolonged QT interval   Chronic diastolic CHF (congestive heart failure) ([place])   1. Progressive prostate cancer, complicated with dehydration, hypokalemia and hypomagnesemia.  Patient received medical therapy with intravenous fluids, his electrolytes were corrected with potassium chloride and magnesium sulfate. Pain control with morphine and tramadol.  He has advanced cancer with bone metastasis, rapid progressive decline in his functional status, not candidate for further cancer therapy.  Patient will be discharged to skilled nursing facility for physical rehabilitation.  Brain mass was evaluated as an outpatient neuro-oncology, concluded likely nonmalignant lesion.   Patient is tolerating po well.   2.  Severe protein calorie malnutrition, pressure ulcers, present on admission, sacrum unable to stage, mid back stage II.  Continue attritional supplements, out of bed as tolerated, continue physical therapy. Local skin care.  Continue to encourage po intake.   3.  Chronic diastolic heart failure.  His left ventricle ejection fraction is 55%, he had no signs of decompensation during his hospitalization. At discharge resume furosemide as needed 40 mg daily.   No clinical signs of exacerbation, continue to use furosemide as needed.   4.  Paroxysmal atrial fibrillation, prolonged QTC.  Amiodarone was discontinued due to prolonged QT interval, continue metoprolol for rate control.  Patient not candidate for anticoagulation due to frailty and risk of  falls.  Rate continue to be well controlled. Off telemetry  5. T2DM/ dyslipidemia. It has remained well controlled, fasting glucose 120 and 189.  Continue to hold on glimepiride to prevent hypoglycemia  Tolerating po well.   Status is: Inpatient  Remains inpatient appropriate because:Unsafe d/c plan   Dispo: The patient is from: Home              Anticipated d/c is to: SNF              Anticipated d/c date is: 1 day              Patient currently is medically stable to d/c.   DVT prophylaxis: Enoxaparin   Code Status:   dnr   Family Communication:  I spoke with patient's son at the bedside, we talked in detail about patient's condition, plan of care and prognosis and all questions were addressed.      Nutrition Status: Nutrition Problem: Severe Malnutrition Etiology: chronic illness, cancer and cancer related treatments Signs/Symptoms: severe fat depletion, severe muscle depletion Interventions: Ensure Enlive (each supplement provides 350kcal and 20 grams of protein), MVI, Magic cup     Skin Documentation: Pressure Injury 07/05/20 Sacrum Deep Tissue Pressure Injury - Purple or maroon localized area of discolored intact skin or blood-filled blister due to damage of underlying soft tissue from pressure and/or shear. (Active)  07/05/20 0600  Location: Sacrum  Location Orientation:   Staging: Deep Tissue Pressure Injury - Purple or maroon localized area of discolored intact skin or blood-filled blister due to damage of underlying soft tissue from pressure and/or shear.  Wound Description (Comments):   Present on Admission: Yes     Pressure Injury 07/17/20 Back Mid Stage 2 -  Partial thickness loss of dermis presenting as a shallow open injury with a red, pink wound bed without slough. (Active)  07/17/20 1305  Location: Back  Location Orientation: Mid  Staging: Stage 2 -  Partial thickness loss of dermis presenting as a shallow open injury with a red, pink wound bed without slough.  Wound Description (Comments):   Present on  Admission: Yes       Subjective: Patient is feeling well, continue to be weak and deconditioned, no nausea or vomiting.   Objective: Vitals:   07/21/20 2045 07/22/20 0618 07/22/20 2059 07/23/20 0538  BP: (!) 157/67 (!) 164/69 (!) 145/69 (!) 149/62  Pulse: 61 62 (!) 59 60  Resp: 17 17 18 18   Temp: 97.9 F (36.6 C) 97.8 F (36.6 C) 98 F (36.7 C) 98.1 F (36.7 C)  TempSrc: Oral Oral Oral Oral  SpO2: 98% 99% 100% 97%  Weight:      Height:        Intake/Output Summary (Last 24 hours) at 07/23/2020 1501 Last data filed at 07/23/2020 0900 Gross per 24 hour  Intake 118 ml  Output 650 ml  Net -532 ml   Filed Weights   07/17/20 1449  Weight: 75.8 kg    Examination:   General: Not in pain or dyspnea,  Neurology: Awake and alert, non focal  E ENT: no pallor, no icterus, oral mucosa moist Cardiovascular: No JVD. S1-S2 present, rhythmic, no gallops, rubs, or murmurs. No lower extremity edema. Pulmonary: positive breath sounds bilaterally,  Gastrointestinal. Abdomen soft and non tender Skin. No rashes Musculoskeletal: no joint deformities, very low muscular mass lower extremities.      Data Reviewed: I have personally reviewed following labs and imaging studies  CBC: Recent Labs  Lab 07/17/20 1355 07/18/20 0457 07/19/20 1341 07/20/20 0531  WBC  9.0 8.1 7.4 7.1  NEUTROABS 5.9  --  5.2 4.8  HGB 8.7* 8.8* 8.0* 8.5*  HCT 29.5* 29.6* 27.1* 28.4*  MCV 81.9 83.1 82.1 83.0  PLT 125* 114* 101* 93*   Basic Metabolic Panel: Recent Labs  Lab 07/18/20 0457 07/18/20 1637 07/19/20 1341 07/20/20 0531 07/21/20 0536  NA 143 140 138 139 139  K 2.3* 3.7 2.3* 2.5* 4.2  CL 105 105 100 103 104  CO2 29 24 29 27 28   GLUCOSE 100* 89 90 120* 189*  BUN 11 10 10 8  7*  CREATININE 0.64 0.57* 0.62 0.57* 0.58*  CALCIUM 7.8* 7.6* 7.4* 7.4* 7.8*  MG 1.8  --  1.7 1.9 2.2  PHOS  --   --  2.6 2.4*  --    GFR: Estimated Creatinine Clearance: 72.2 mL/min (A) (by C-G formula based on  SCr of 0.58 mg/dL (L)). Liver Function Tests: Recent Labs  Lab 07/17/20 1355 07/19/20 1341 07/20/20 0531  AST 32 42* 37  ALT 13 12 13   ALKPHOS 466* 553* 565*  BILITOT 0.6 0.9 0.8  PROT 6.1* 5.9* 5.7*  ALBUMIN 1.9* 2.0* 2.0*   No results for input(s): LIPASE, AMYLASE in the last 168 hours. No results for input(s): AMMONIA in the last 168 hours. Coagulation Profile: Recent Labs  Lab 07/17/20 1355  INR 1.5*   Cardiac Enzymes: No results for input(s): CKTOTAL, CKMB, CKMBINDEX, TROPONINI in the last 168 hours. BNP (last 3 results) No results for input(s): PROBNP in the last 8760 hours. HbA1C: No results for input(s): HGBA1C in the last 72 hours. CBG: No results for input(s): GLUCAP in the last 168 hours. Lipid Profile: No results for input(s): CHOL, HDL, LDLCALC, TRIG, CHOLHDL, LDLDIRECT in the last 72 hours. Thyroid Function Tests: No results for input(s): TSH, T4TOTAL, FREET4, T3FREE, THYROIDAB in the last 72 hours. Anemia Panel: No results for input(s): VITAMINB12, FOLATE, FERRITIN, TIBC, IRON, RETICCTPCT in the last 72 hours.    Radiology Studies: I have reviewed all of the imaging during this hospital visit personally     Scheduled Meds: . enoxaparin (LOVENOX) injection  40 mg Subcutaneous Q24H  . feeding supplement  237 mL Oral BID BM  . lidocaine  1 patch Transdermal Q24H  . metoprolol tartrate  50 mg Oral BID  . polyethylene glycol  17 g Oral Daily   Continuous Infusions:   LOS: 6 days        Adaja Wander Gerome Apley, MD

## 2020-07-23 NOTE — Progress Notes (Signed)
[facility]) Outpatient Palliative Care  Mr. Haq has been referred to our outpatient palliative care services but has yet to be seen by [place].  [facility] will continue to follow and set up services when he d/c's.  [name] RN, BSN, [facility] Liaison

## 2020-07-24 MED ORDER — LIDOCAINE 5 % EX PTCH
1.0000 | MEDICATED_PATCH | CUTANEOUS | Status: DC
  Administered 2020-07-25 – 2020-07-27 (×3): 1 via TRANSDERMAL
  Filled 2020-07-24 (×3): qty 1

## 2020-07-24 NOTE — Progress Notes (Signed)
PROGRESS NOTE    Adrian Singh  JHE:[id number] DOB: [birth year] DOA: 07/17/2020 PCP: [name], MD    Brief Narrative:  Adrian Singh was admitted to the hospital with a workingdiagnosis ofdehydration and hypokalemia in the setting ofadvanced prostate cancer.  86 year old male with past medical history of coronary artery disease,diastolicheart failure, paroxysmal atrial fibrillation, type IIdiabetesmellitus, chronic anemia,prostate cancer with bone metastasis andapositive brain mass. Recently admitted to the hospital for pneumonia and uncontrolled fibrillation,he wasdischarged 10/29. At home he had progressive weakness, poor oral intake and positive mid back pain. Due to rapid and progressive deconditioning he was brought back to the hospital by his family members. On his initial physical examination his blood pressure was 149/68, heart rate 70, respiratory rate 18, temperature 97.8, oxygen saturation 98%,he had temporal wasting, heart S1-S2, irregular, bradycardic, lungs with no wheezing or rhonchi, abdomen soft, no lower extremity edema.  Sodium 142, potassium 2.1, chloride 105, bicarb 26, glucose 78, BUN 12, creatinine 0.63, albumin 1.9, BNP 1002, troponin I 30, white count 9.0, hemoglobin 8.7, hematocrit 39.5, platelets 125, INR 1.5. SARS COVID-19 negative. Urinalysis negative for infection, specific gravity 1.015with30 protein. Chest radiograph with no infiltrates. EKG 80 bpm, left axis deviation, prolonged QTC 581, low voltage, sinus arrhythmia, no ST segment or T wave changes.  Patient received supportive medical therapywith IV fluids and electrolyte supplementation. He was seen by oncology,withno further recommendations for cancer treatment due to poor functional status and advanced disease.  Patient was seen by physical therapy, recommendations to continue care at the skilled nursing facility.  Insurance has declined SNF, his family has appealed this  decision. Pending placement.    Assessment & Plan:   Principal Problem:   Hypokalemia Active Problems:   PAF (paroxysmal atrial fibrillation) (HCC)   Prostate cancer ([place])   Primary malignant neoplasm of prostate metastatic to bone (HCC)   General weakness   Pressure ulcer of back   Severe protein-calorie malnutrition (HCC)   Failure to thrive in adult   Back pain   Prolonged QT interval   Chronic diastolic CHF (congestive heart failure) ([place])   1. Progressive prostate cancer,complicated with dehydration, hypokalemia and hypomagnesemia. Patient received medical therapy with intravenous fluids, his electrolytes were corrected with potassium chloride and magnesium sulfate. Pain control with morphine and tramadol.  He has advanced cancer with bone metastasis, rapid progressive decline in his functional status, not candidate for further cancer therapy.  Patient will be discharged to skilled nursing facility for physical rehabilitation.  Brain mass was evaluated as an outpatientneuro-oncology, concluded likely nonmalignantlesion.   Patient with no nausea or vomiting, tolerating po well.   2.Severe protein calorie malnutrition, pressure ulcers, present on admission, sacrum unable to stage, mid back stage II.Continue attritional supplements, out of bed as tolerated, continue physical therapy. Local skin care.  3.Chronic diastolic heart failure.His left ventricle ejection fraction is 55%, he had no signs of decompensation during his hospitalization. At discharge resume furosemide as needed 40 mg daily.   Continue with no signs of exacerbation,  furosemide as needed.   4.Paroxysmal atrial fibrillation, prolonged QTC.Amiodarone was discontinued due to prolonged QT interval, continue metoprolol for rate control. Patient not candidate for anticoagulation due to frailty and risk of  falls.  5. T2DM/ dyslipidemia.It has remained well controlled, fasting glucose  120 and 189.  Continue to hold on glimepiride to prevent hypoglycemia   Status is: Inpatient  Remains inpatient appropriate because:Unsafe d/c plan   Dispo: The patient is from: Home  Anticipated d/c is to: SNF              Anticipated d/c date is: 1 day              Patient currently is medically stable to d/c.   DVT prophylaxis: Enoxaparin   Code Status:   dnr   Family Communication:  No family at the bedside      Nutrition Status: Nutrition Problem: Severe Malnutrition Etiology: chronic illness, cancer and cancer related treatments Signs/Symptoms: severe fat depletion, severe muscle depletion Interventions: Ensure Enlive (each supplement provides 350kcal and 20 grams of protein), MVI, Magic cup     Skin Documentation: Pressure Injury 07/05/20 Sacrum Deep Tissue Pressure Injury - Purple or maroon localized area of discolored intact skin or blood-filled blister due to damage of underlying soft tissue from pressure and/or shear. (Active)  07/05/20 0600  Location: Sacrum  Location Orientation:   Staging: Deep Tissue Pressure Injury - Purple or maroon localized area of discolored intact skin or blood-filled blister due to damage of underlying soft tissue from pressure and/or shear.  Wound Description (Comments):   Present on Admission: Yes     Pressure Injury 07/17/20 Back Mid Stage 2 -  Partial thickness loss of dermis presenting as a shallow open injury with a red, pink wound bed without slough. (Active)  07/17/20 1305  Location: Back  Location Orientation: Mid  Staging: Stage 2 -  Partial thickness loss of dermis presenting as a shallow open injury with a red, pink wound bed without slough.  Wound Description (Comments):   Present on Admission: Yes      Subjective: Patient is feeling well,. Continue to be very weak and deconditioned, no nausea or vomiting.   Objective: Vitals:   07/23/20 2000 07/23/20 2130 07/23/20 2228 07/24/20 0549  BP:   (!)  137/57 (!) 178/70  Pulse:  72 68 62  Resp:   14 14  Temp:   98 F (36.7 C) 97.7 F (36.5 C)  TempSrc:   Oral Oral  SpO2: 99%  99% 100%  Weight:      Height:       No intake or output data in the 24 hours ending 07/24/20 1413 Filed Weights   07/17/20 1449  Weight: 75.8 kg    Examination:   General: Not in pain or dyspnea. Deconditioned  Neurology: Awake and alert, non focal  E ENT: no pallor, no icterus, oral mucosa moist Cardiovascular: No JVD. S1-S2 present, rhythmic, no gallops, rubs, or murmurs. No lower extremity edema. Pulmonary: positive breath sounds bilaterally with no wheezing, rhonchi or rales. Gastrointestinal. Abdomen soft and non tender Skin. No rashes Musculoskeletal: no joint deformities. Decreased muscle mass at the lower extremities.      Data Reviewed: I have personally reviewed following labs and imaging studies  CBC: Recent Labs  Lab 07/18/20 0457 07/19/20 1341 07/20/20 0531  WBC 8.1 7.4 7.1  NEUTROABS  --  5.2 4.8  HGB 8.8* 8.0* 8.5*  HCT 29.6* 27.1* 28.4*  MCV 83.1 82.1 83.0  PLT 114* 101* 93*   Basic Metabolic Panel: Recent Labs  Lab 07/18/20 0457 07/18/20 1637 07/19/20 1341 07/20/20 0531 07/21/20 0536  NA 143 140 138 139 139  K 2.3* 3.7 2.3* 2.5* 4.2  CL 105 105 100 103 104  CO2 29 24 29 27 28   GLUCOSE 100* 89 90 120* 189*  BUN 11 10 10 8  7*  CREATININE 0.64 0.57* 0.62 0.57* 0.58*  CALCIUM 7.8* 7.6* 7.4* 7.4*  7.8*  MG 1.8  --  1.7 1.9 2.2  PHOS  --   --  2.6 2.4*  --    GFR: Estimated Creatinine Clearance: 72.2 mL/min (A) (by C-G formula based on SCr of 0.58 mg/dL (L)). Liver Function Tests: Recent Labs  Lab 07/19/20 1341 07/20/20 0531  AST 42* 37  ALT 12 13  ALKPHOS 553* 565*  BILITOT 0.9 0.8  PROT 5.9* 5.7*  ALBUMIN 2.0* 2.0*   No results for input(s): LIPASE, AMYLASE in the last 168 hours. No results for input(s): AMMONIA in the last 168 hours. Coagulation Profile: No results for input(s): INR, PROTIME in the  last 168 hours. Cardiac Enzymes: No results for input(s): CKTOTAL, CKMB, CKMBINDEX, TROPONINI in the last 168 hours. BNP (last 3 results) No results for input(s): PROBNP in the last 8760 hours. HbA1C: No results for input(s): HGBA1C in the last 72 hours. CBG: No results for input(s): GLUCAP in the last 168 hours. Lipid Profile: No results for input(s): CHOL, HDL, LDLCALC, TRIG, CHOLHDL, LDLDIRECT in the last 72 hours. Thyroid Function Tests: No results for input(s): TSH, T4TOTAL, FREET4, T3FREE, THYROIDAB in the last 72 hours. Anemia Panel: No results for input(s): VITAMINB12, FOLATE, FERRITIN, TIBC, IRON, RETICCTPCT in the last 72 hours.    Radiology Studies: I have reviewed all of the imaging during this hospital visit personally     Scheduled Meds: . enoxaparin (LOVENOX) injection  40 mg Subcutaneous Q24H  . feeding supplement  237 mL Oral BID BM  . lidocaine  1 patch Transdermal Q24H  . metoprolol tartrate  50 mg Oral BID  . polyethylene glycol  17 g Oral Daily   Continuous Infusions:   LOS: 7 days        Tennessee Perra Gerome Apley, MD

## 2020-07-25 ENCOUNTER — Non-Acute Institutional Stay: Payer: Self-pay | Admitting: Hospice

## 2020-07-25 NOTE — TOC Progression Note (Signed)
Transition of Care [facility]) - Progression Note    Patient Details  Name: Adrian Singh MRN: [id number] Date of Birth: [birth year]  Transition [facility] CM/SW Contact  Melannie Metzner, Marjie Skiff, RN Phone Number: 07/25/2020, 2:14 PM  Clinical Narrative:    Spoke with daughter Adrian Singh via phone to inquire about the appeal of SNF denial that was supposedly filed by family with [place] on 11/5. Adrian Singh states that she had not filed it yet and was under the impression she was waiting on the MD to do something on our end. Informed Adrian Singh to do SNF appeal asap as MD had already done Peer to Peer last week. Adrian Singh to file SNF appeal with [facility] today. TOC will continue to follow.    Expected Discharge Plan: [place] Barriers to Discharge: Ship broker, Other (comment)  Expected Discharge Plan and Services Expected Discharge Plan: [place]   Discharge Planning Services: CM Consult Post Acute Care Choice: [place] Living arrangements for the past 2 months: [place] Expected Discharge Date: 07/22/20                                     Social Determinants of Health (SDOH) Interventions    Readmission Risk Interventions Readmission Risk Prevention Plan 07/19/2020 06/20/2020 06/11/2020  Transportation Screening Complete Complete Complete  Medication Review Press photographer) Complete Complete Complete  PCP or Specialist appointment within 3-5 days of discharge Complete Complete -  [place] or Home Care Consult Complete Complete -  SW Recovery Care/Counseling Consult Complete Complete Complete  Palliative Care Screening Complete Not Applicable Not [place] - Complete Complete  Some recent data might be hidden

## 2020-07-25 NOTE — Progress Notes (Signed)
PROGRESS NOTE    KENARD MORAWSKI  NWG:[id number] DOB: 1934-06-14 DOA: 07/17/2020 PCP: [name], MD    Brief Narrative:  Mr. Gullion was admitted to the hospital with a workingdiagnosis ofdehydration and hypokalemia in the setting ofadvanced prostate cancer.  86 year old male with past medical history of coronary artery disease,diastolicheart failure, paroxysmal atrial fibrillation, type IIdiabetesmellitus, chronic anemia,prostate cancer with bone metastasis andapositive brain mass. Recently admitted to the hospital for pneumonia and uncontrolled fibrillation,he wasdischarged 10/29. At home he had progressive weakness, poor oral intake and positive mid back pain. Due to rapid and progressive deconditioning he was brought back to the hospital by his family members. On his initial physical examination his blood pressure was 149/68, heart rate 70, respiratory rate 18, temperature 97.8, oxygen saturation 98%,he had temporal wasting, heart S1-S2, irregular, bradycardic, lungs with no wheezing or rhonchi, abdomen soft, no lower extremity edema.  Sodium 142, potassium 2.1, chloride 105, bicarb 26, glucose 78, BUN 12, creatinine 0.63, albumin 1.9, BNP 1002, troponin I 30, white count 9.0, hemoglobin 8.7, hematocrit 39.5, platelets 125, INR 1.5. SARS COVID-19 negative. Urinalysis negative for infection, specific gravity 1.015with30 protein. Chest radiograph with no infiltrates. EKG 80 bpm, left axis deviation, prolonged QTC 581, low voltage, sinus arrhythmia, no ST segment or T wave changes.  Patient received supportive medical therapywith IV fluids and electrolyte supplementation. He was seen by oncology,withno further recommendations for cancer treatment due to poor functional status and advanced disease.  Patient was seen by physical therapy, recommendations to continue care at the skilled nursing facility.  Insurance has declined SNF, his family has appealed this  decision. Pending placement.    Assessment & Plan:   Principal Problem:   Hypokalemia Active Problems:   PAF (paroxysmal atrial fibrillation) (HCC)   Prostate cancer ([place])   Primary malignant neoplasm of prostate metastatic to bone (HCC)   General weakness   Pressure ulcer of back   Severe protein-calorie malnutrition (HCC)   Failure to thrive in adult   Back pain   Prolonged QT interval   Chronic diastolic CHF (congestive heart failure) ([place])   1. Progressive prostate cancer,complicated with dehydration, hypokalemia and hypomagnesemia. Patient received medical therapy with intravenous fluids, his electrolytes were corrected with potassium chloride and magnesium sulfate. Pain control with morphine and tramadol.  He has advanced cancer with bone metastasis, rapid progressive decline in his functional status, not candidate for further cancer therapy.  Patient will be discharged to skilled nursing facility for physical rehabilitation.  Brain mass was evaluated as an outpatientneuro-oncology, concluded likely nonmalignantlesion.  Tolerating po well.   2.Severe protein calorie malnutrition, pressure ulcers, present on admission, sacrum unable to stage, mid back stage II.Continue attritional supplements, out of bed as tolerated, continue physical therapy. Local skin care.  3.Chronic diastolic heart failure.His left ventricle ejection fraction is 55%, he had no signs of decompensation during his hospitalization. At discharge resume furosemide as needed 40 mg daily.  Stable with no exacerbation.   4.Paroxysmal atrial fibrillation, prolonged QTC.Amiodarone was discontinued due to prolonged QT interval, continue metoprolol for rate control. Patient not candidate for anticoagulation due to frailty and risk of  falls.  5. T2DM/ dyslipidemia.It has remained well controlled, fasting glucose 120 and 189.  Continue to hold on glimepiride to prevent  hypoglycemia     Status is: Inpatient  Remains inpatient appropriate because:Unsafe d/c plan   Dispo: The patient is from: Home              Anticipated d/c  is to: SNF              Anticipated d/c date is: 1 day              Patient currently is medically stable to d/c.   DVT prophylaxis: Enoxaparin   Code Status:   dnr   Family Communication:  I spoke with patient's daughter at the bedside, we talked in detail about patient's condition, plan of care and prognosis and all questions were addressed.      Nutrition Status: Nutrition Problem: Severe Malnutrition Etiology: chronic illness, cancer and cancer related treatments Signs/Symptoms: severe fat depletion, severe muscle depletion Interventions: Ensure Enlive (each supplement provides 350kcal and 20 grams of protein), MVI, Magic cup     Skin Documentation: Pressure Injury 07/05/20 Sacrum Deep Tissue Pressure Injury - Purple or maroon localized area of discolored intact skin or blood-filled blister due to damage of underlying soft tissue from pressure and/or shear. (Active)  07/05/20 0600  Location: Sacrum  Location Orientation:   Staging: Deep Tissue Pressure Injury - Purple or maroon localized area of discolored intact skin or blood-filled blister due to damage of underlying soft tissue from pressure and/or shear.  Wound Description (Comments):   Present on Admission: Yes     Pressure Injury 07/17/20 Back Mid Stage 2 -  Partial thickness loss of dermis presenting as a shallow open injury with a red, pink wound bed without slough. (Active)  07/17/20 1305  Location: Back  Location Orientation: Mid  Staging: Stage 2 -  Partial thickness loss of dermis presenting as a shallow open injury with a red, pink wound bed without slough.  Wound Description (Comments):   Present on Admission: Yes       Subjective: Patient is feeling well, tolerating po well, no nausea or vomiting, no dyspnea or chest pain. Continue to be very  weak and deconditioned.,   Objective: Vitals:   07/24/20 0549 07/24/20 2109 07/25/20 0622 07/25/20 1348  BP: (!) 178/70 (!) 148/67 (!) 168/71 (!) 151/70  Pulse: 62 63 61 60  Resp: 14 15 17 16   Temp: 97.7 F (36.5 C) 97.9 F (36.6 C) 97.7 F (36.5 C) 97.9 F (36.6 C)  TempSrc: Oral Oral Oral Oral  SpO2: 100% 97% 99% 99%  Weight:   66.9 kg   Height:        Intake/Output Summary (Last 24 hours) at 07/25/2020 1556 Last data filed at 07/25/2020 0845 Gross per 24 hour  Intake 120 ml  Output 850 ml  Net -730 ml   Filed Weights   07/17/20 1449 07/25/20 0622  Weight: 75.8 kg 66.9 kg    Examination:   General: Not in pain or dyspnea, deconditioned  Neurology: Awake and alert, non focal  E ENT: mild pallor, no icterus, oral mucosa moist Cardiovascular: No JVD. S1-S2 present, rhythmic, no gallops, rubs, or murmurs. No lower extremity edema. Pulmonary: positive breath sounds bilaterally, Gastrointestinal. Abdomen soft and non tender Skin. No rashes Musculoskeletal: decreased muscle mass lower extremities.      Data Reviewed: I have personally reviewed following labs and imaging studies  CBC: Recent Labs  Lab 07/19/20 1341 07/20/20 0531  WBC 7.4 7.1  NEUTROABS 5.2 4.8  HGB 8.0* 8.5*  HCT 27.1* 28.4*  MCV 82.1 83.0  PLT 101* 93*   Basic Metabolic Panel: Recent Labs  Lab 07/19/20 1341 07/20/20 0531 07/21/20 0536  NA 138 139 139  K 2.3* 2.5* 4.2  CL 100 103 104  CO2 29 27  28  GLUCOSE 90 120* 189*  BUN 10 8 7*  CREATININE 0.62 0.57* 0.58*  CALCIUM 7.4* 7.4* 7.8*  MG 1.7 1.9 2.2  PHOS 2.6 2.4*  --    GFR: Estimated Creatinine Clearance: 66.2 mL/min (A) (by C-G formula based on SCr of 0.58 mg/dL (L)). Liver Function Tests: Recent Labs  Lab 07/19/20 1341 07/20/20 0531  AST 42* 37  ALT 12 13  ALKPHOS 553* 565*  BILITOT 0.9 0.8  PROT 5.9* 5.7*  ALBUMIN 2.0* 2.0*   No results for input(s): LIPASE, AMYLASE in the last 168 hours. No results for input(s):  AMMONIA in the last 168 hours. Coagulation Profile: No results for input(s): INR, PROTIME in the last 168 hours. Cardiac Enzymes: No results for input(s): CKTOTAL, CKMB, CKMBINDEX, TROPONINI in the last 168 hours. BNP (last 3 results) No results for input(s): PROBNP in the last 8760 hours. HbA1C: No results for input(s): HGBA1C in the last 72 hours. CBG: No results for input(s): GLUCAP in the last 168 hours. Lipid Profile: No results for input(s): CHOL, HDL, LDLCALC, TRIG, CHOLHDL, LDLDIRECT in the last 72 hours. Thyroid Function Tests: No results for input(s): TSH, T4TOTAL, FREET4, T3FREE, THYROIDAB in the last 72 hours. Anemia Panel: No results for input(s): VITAMINB12, FOLATE, FERRITIN, TIBC, IRON, RETICCTPCT in the last 72 hours.    Radiology Studies: I have reviewed all of the imaging during this hospital visit personally     Scheduled Meds: . enoxaparin (LOVENOX) injection  40 mg Subcutaneous Q24H  . feeding supplement  237 mL Oral BID BM  . lidocaine  1 patch Transdermal Q24H  . metoprolol tartrate  50 mg Oral BID  . polyethylene glycol  17 g Oral Daily   Continuous Infusions:   LOS: 8 days        Donnika Kucher Gerome Apley, MD

## 2020-07-25 NOTE — Care Management Important Message (Signed)
Important Message  Patient Details IM Letter given to the Patient Name: Adrian Singh MRN: [id number] Date of Birth: 06/04/1934   Medicare Important Message Given:  Yes     Kerin Salen 07/25/2020, 10:54 AM

## 2020-07-26 ENCOUNTER — Telehealth: Payer: Self-pay | Admitting: Hospice

## 2020-07-26 ENCOUNTER — Inpatient Hospital Stay: Payer: Medicare Other

## 2020-07-26 DIAGNOSIS — Z515 Encounter for palliative care: Secondary | ICD-10-CM

## 2020-07-26 LAB — SARS CORONAVIRUS 2 BY RT PCR (HOSPITAL ORDER, PERFORMED IN ~~LOC~~ HOSPITAL LAB): SARS Coronavirus 2: NEGATIVE

## 2020-07-26 MED ORDER — LIDOCAINE 5 % EX PTCH: 1.0000 | MEDICATED_PATCH | CUTANEOUS | 0 refills | Status: AC

## 2020-07-26 NOTE — Telephone Encounter (Signed)
Okay 

## 2020-07-26 NOTE — TOC Transition Note (Signed)
Transition of Care [facility]) - CM/SW Discharge Note   Patient Details  Name: Adrian Singh MRN: [id number] Date of Birth: October 02, 1933  [facility] CM/SW Contact:  Lynnell Catalan, RN Phone Number: 07/26/2020, 3:40 PM   Clinical Narrative:    Contacted [employer] to check on status of family appeal of denial of SNF. Per [employer] in [employer] additional paperwork is needed from daughter [name] along with clinicals to start appeal. Paperwork was faxed to this CM by [employer] for [employer]. This CM gave paperwork to [facility] to complete. This CM faxed requested paperwork to [facility].   In the meantime daughter to take pt back home to await appeal decision. She states that she will care for pt 24hr/day until he is able to get into a LTC SNF(Per [name] there is a bed at [facility] available at the end of this week and she is going to pay for it today). [name] says that they have a hospital bed, bedside commode and wheelchair at home for pt. He needs no additional DME. [facility] to transport pt home.   Final next level of care: Skilled Nursing Facility Barriers to Discharge: Ship broker, Other (comment)   Patient Goals and CMS Choice Patient states their goals for this hospitalization and ongoing recovery are:: go back to rehab CMS Medicare.gov Compare Post Acute Care list provided to:: Patient Choice offered to / list presented to : Adult Children  Discharge Placement                       Discharge Plan and Services   Discharge Planning Services: CM Consult Post Acute Care Choice: [place]                               Social Determinants of Health (SDOH) Interventions     Readmission Risk Interventions Readmission Risk Prevention Plan 07/19/2020 06/20/2020 06/11/2020  Transportation Screening Complete Complete Complete  Medication Review Press photographer) Complete Complete Complete  PCP or Specialist appointment within 3-5 days of discharge  Complete Complete -  [place] or Home Care Consult Complete Complete -  SW Recovery Care/Counseling Consult Complete Complete Complete  Palliative Care Screening Complete Not Applicable Not [place] - Complete Complete  Some recent data might be hidden

## 2020-07-26 NOTE — Progress Notes (Addendum)
Visit did not hold. Multiple unsuccessful attempts to see patient in the facility 07/05/2020, 07/08/2020, 07/15/2020, 07/25/2020 because patient still in the hospital per facility NP and Nurse manager.

## 2020-07-26 NOTE — Telephone Encounter (Signed)
Pt duagther called to ssy that she received Dr. Silvio Pate message , and she is on the way to the hospital to get everything striaght as far as seeing where he would be placed.

## 2020-07-26 NOTE — Progress Notes (Signed)
Patient has remained clinically stable, plan was for him to be discharged home with home services until approval from insurance for SNF  Her daughter had received a response today and apparently he has been approved.  Will hold on his discharge until tomorrow in order him to go to skilled nursing facility.

## 2020-07-26 NOTE — Telephone Encounter (Signed)
NP called and spoke with patient's daughter [name] regarding [facility] not able to see patient due to his multiple hospital admissions. Emotional support provided. She said patient is likely to be discharged today and would call/text NP to coordinate a visit. She expressed appreciation for the call.

## 2020-07-27 NOTE — Progress Notes (Signed)
Patient was waiting for skilled nursing facility placement, he has a bed offer today.  Discharge summary signed  by Dr. Cathlean Sauer yesterday at 2:32 pm.  No interval changes.  Detail please refer to discharge summary by Dr. Cathlean Sauer.

## 2020-07-27 NOTE — Progress Notes (Signed)
Occupational Therapy Progress Note  Patient able to tolerate activity better today with prolonged sitting EOB for BP to recover. Patient with initial 38 point drop from semi-supine to sitting at EOB, however improved by 10 points after sitting EOB ~9 mins and participation in seated exercise. Unable to obtain standing BP in stedy due to LE buckling. Patient require heavy mod x2 from elevated bed height to stand and use of stedy to transfer to chair. Acute OT will continue to follow to maximize activity tolerance and strengthening necessary to participate in self care.    07/27/20 1300  OT Visit Information  Last OT Received On 07/27/20  Assistance Needed +2  PT/OT/SLP Co-Evaluation/Treatment Yes  Reason for Co-Treatment For patient/therapist safety;To address functional/ADL transfers  OT goals addressed during session ADL's and self-care  History of Present Illness 86 y.o. WM PMHx CAD status post stenting, A. 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 Multiple recent admissions. (admitted to the hospital 9/23--9/27 orthostatic hypotension), (admitted hospital 10 2--10/ 8 orthostatic hypotension). admitted 10/18-10/29/21 with Ileus. Now admitted with FTT.  Precautions  Precautions Other (comment);Fall  Precaution Comments Hx of orthostatic hypotension, Loose BMs  Pain Assessment  Pain Assessment No/denies pain  Cognition  Arousal/Alertness Awake/alert  Behavior During Therapy WFL for tasks assessed/performed  Overall Cognitive Status Within Functional Limits for tasks assessed  ADL  Overall ADL's  Needs assistance/impaired  Toilet Transfer Moderate assistance;+2 for physical assistance;+2 for safety/equipment;BSC  Toilet Transfer Details (indicate cue type and reason) use of sara stedy, mod A x2 to power up to standing from  elevated bed height  Bed Mobility  Overal bed mobility Needs Assistance  Bed Mobility Rolling;Sidelying to Sit  Rolling Mod assist  Sidelying to sit Max assist;HOB elevated  General bed mobility comments Pt cued for sequencing and able to get LEs off bed, required max A to lift trunk. Max Assist to scoot hips to EOB, pt able to weight shift for this.   Balance  Overall balance assessment Needs assistance  Sitting-balance support Single extremity supported;Feet supported  Sitting balance-Leahy Scale Fair  Sitting balance - Comments initial poor with UE support, after repositioning and scooting to EOB patient better able to hold balance progress to fair  Transfers  Overall transfer level Needs assistance  Transfer via Lift Equipment Stedy  Transfers Sit to/from Stand  Sit to Stand Mod assist;+2 physical assistance;+2 safety/equipment;From elevated surface  General transfer comment +2 heavy mod A to rise from elevated bed, pt stood ~25 seconds in stedy then sat due to RLE buckling. Pt c/o dizziness in standing. Unable to stand long enough to obtain BP.  General Comments  General comments (skin integrity, edema, etc.) please see PT note for BP  Exercises  Exercises General Upper Extremity  General Exercises - Upper Extremity  Shoulder Horizontal ABduction AROM;Both;5 reps;Seated  Digit Composite Flexion AROM;Both;20 reps;Supine  OT - End of Session  Equipment Utilized During Treatment Other (comment) (stedy)  Activity Tolerance Patient tolerated treatment well  Patient left in chair;with call bell/phone within reach;with family/visitor present  Nurse Communication Mobility status;Need for lift equipment (use stedy)  OT Assessment/Plan  OT Plan Discharge plan remains appropriate  OT Visit Diagnosis Muscle weakness (generalized) (M62.81);Other abnormalities of gait and mobility (R26.89)  OT Frequency (ACUTE ONLY) Min 2X/week  Follow Up Recommendations Supervision/Assistance - 24 hour   OT Equipment None recommended by OT  AM-PAC  OT "6 Clicks" Daily Activity Outcome Measure (Version 2)  Help from another person eating meals? 3  Help from another person taking care of personal grooming? 3  Help from another person toileting, which includes using toliet, bedpan, or urinal? 1  Help from another person bathing (including washing, rinsing, drying)? 2  Help from another person to put on and taking off regular upper body clothing? 2  Help from another person to put on and taking off regular lower body clothing? 1  6 Click Score 12  OT Goal Progression  Progress towards OT goals Progressing toward goals  Acute Rehab OT Goals  Patient Stated Goal wants to do short term rehab to get stronger  OT Goal Formulation With patient  Time For Goal Achievement 08/03/20  Potential to Achieve Goals Fair  OT Time Calculation  OT Start Time (ACUTE ONLY) 0937  OT Stop Time (ACUTE ONLY) 1010  OT Time Calculation (min) 33 min  OT General Charges  $OT Visit 1 Visit  OT Treatments  $Self Care/Home Management  8-22 mins   Adrian Singh OT OT pager: [phone]

## 2020-07-27 NOTE — TOC Transition Note (Addendum)
Transition of Care [facility]) - CM/SW Discharge Note   Patient Details  Name: Adrian Singh MRN: [id number] Date of Birth: 02-03-1934  Transition of Care [facility]) CM/SW Contact:  Lynnell Catalan, RN Phone Number: 07/27/2020, 10:52 AM   Clinical Narrative:    Pt discharge was cancelled last night. Per MD note it was due to daughter receiving information that her SNF auth appeal had been won. This was incorrect as the daughter had also filed an appeal with [employer] by mistake. [employer] left message that appeal was won. Judithann Graves is to file an appeal disputing the pt discharge which was not the intent of the daughter. This CM contacted Case worker Lilly who is doing the correct appeal for SNF auth denial. Ralph Leyden states that this has not been completed yet. [place] liaison Elyse Hsu contacted this morning to see if they could take the pt anyway, even though appeal is not completed. Elyse Hsu states that a bed has now become available and since the daughter has already paid for long term care they could follow up on the SNF appeal after the pt has arrived to [place]. [name] aware of all above information. Awaiting DC summary from MD and pt will transport via [facility] to [place] today.  RN to call report to [phone]   Final next level of care: Skilled Nursing Facility Barriers to Discharge: Insurance Authorization, Other (comment)   Patient Goals and CMS Choice Patient states their goals for this hospitalization and ongoing recovery are:: go back to rehab CMS Medicare.gov Compare Post Acute Care list provided to:: Patient Choice offered to / list presented to : Adult Children  Discharge Placement                       Discharge Plan and Services   Discharge Planning Services: CM Consult Post Acute Care Choice: [place]                               Social Determinants of Health (SDOH) Interventions     Readmission Risk Interventions Readmission Risk  Prevention Plan 07/19/2020 06/20/2020 06/11/2020  Transportation Screening Complete Complete Complete  Medication Review Press photographer) Complete Complete Complete  PCP or Specialist appointment within 3-5 days of discharge Complete Complete -  [place] or Home Care Consult Complete Complete -  SW Recovery Care/Counseling Consult Complete Complete Complete  Palliative Care Screening Complete Not Applicable Not [place] - Complete Complete  Some recent data might be hidden

## 2020-07-27 NOTE — Progress Notes (Signed)
Physical Therapy Treatment Patient Details Name: Adrian Singh MRN: [id number] DOB: 11/07/1933 Today's Date: 07/27/2020    History of Present Illness 86 y.o. WM PMHx CAD status post stenting, A. 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 Multiple recent admissions. (admitted to the hospital 9/23--9/27 orthostatic hypotension), (admitted hospital 10 2--10/ 8 orthostatic hypotension). admitted 10/18-10/29/21 with Ileus. Now admitted with FTT.    PT Comments    Pt had improved tolerance to sitting today, he sat at edge of bed for ~9 minutes with mild dizziness. BP supine 148/71 supine, sitting 110/51, after ~5 minutes sitting 125/60. +2 mod assist to stand with Stedy, pt stood ~25 seconds, tolerance limited by dizziness and RLE buckling, unable to stand long enough to obtain BP reading. Performed BUE/LE strengthening exercises as well as trunk and neck AROM.    Follow Up Recommendations  SNF (denied by insurance but family appealing) vs. HHPT with 24* assistance     Equipment Recommendations  Wheelchair cushion (measurements PT);Wheelchair (measurements PT);Hospital bed;Other (comment) (and hoyer ; with max HH services if has to return home)    Recommendations for Other Services       Precautions / Restrictions Precautions Precautions: Other (comment);Fall Precaution Comments: Hx of orthostatic hypotension, Loose BMs Restrictions Weight Bearing Restrictions: No    Mobility  Bed Mobility Overal bed mobility: Needs Assistance Bed Mobility: Rolling;Sidelying to Sit Rolling: Mod assist Sidelying to sit: Max assist       General bed mobility comments: Pt cued for sequencing and was able to get legs off bed but required mod A to lift trunk. Max Assist to scoot hips to EOB, pt able to weight shift for this. Pt sat edge of  bed ~9 minutes where he performed LE exercises, reaching, trunk/neck rotation AROM. VCs to correct flexed posture.  Transfers Overall transfer level: Needs assistance   Transfers: Sit to/from Stand;Stand Pivot Transfers Sit to Stand: Mod assist;+2 physical assistance;+2 safety/equipment;From elevated surface Stand pivot transfers: Total assist;+2 physical assistance       General transfer comment: +2 heavy mod A to rise from elevated bed, pt stood ~25 seconds in stedy then sat due to RLE buckling. Pt c/o dizziness in standing. Unable to stand long enough to obtain BP.  Ambulation/Gait                 Stairs             Wheelchair Mobility    Modified Rankin (Stroke Patients Only)       Balance Overall balance assessment: Needs assistance Sitting-balance support: Single extremity supported;Feet supported Sitting balance-Leahy Scale: Fair Sitting balance - Comments: Initially poor with mild posterior lean and requiring BUE supported, then fair -able to sit without BUE supported. Sat EOB ~ 9 minutes. Pt reported mild dizziness. BP 110/51 sitting initially, then 125 sitting after several minutes. 148/71 supine.     Standing balance-Leahy Scale: Zero                              Cognition Arousal/Alertness: Awake/alert Behavior During Therapy: WFL for tasks assessed/performed Overall Cognitive Status: Within Functional Limits for tasks assessed  Exercises General Exercises - Lower Extremity Ankle Circles/Pumps: AROM;Both;Supine;20 reps;Seated Long Arc Quad: AROM;Both;10 reps;Seated Hip Flexion/Marching: AROM;Both;10 reps;Seated Other Exercises Other Exercises: hip adductor pillow squeeze x 5, 5 second hold in supine Other Exercises: bridging x 5 with 5 second hold Other Exercises: trunk rotation AROM x 3 B, cervical rotation x 3 B AROM, cervical extension x 1 AROM    General Comments         Pertinent Vitals/Pain Pain Assessment: No/denies pain    Home Living                      Prior Function            PT Goals (current goals can now be found in the care plan section) Acute Rehab PT Goals Patient Stated Goal: wants to do short term rehab to get stronger PT Goal Formulation: With patient/family Time For Goal Achievement: 08/03/20 Potential to Achieve Goals: Fair Progress towards PT goals: Progressing toward goals    Frequency    Min 3X/week      PT Plan Current plan remains appropriate    Co-evaluation PT/OT/SLP Co-Evaluation/Treatment: Yes Reason for Co-Treatment: Complexity of the patient's impairments (multi-system involvement);For patient/therapist safety;To address functional/ADL transfers PT goals addressed during session: Mobility/safety with mobility;Balance;Strengthening/ROM        AM-PAC PT "6 Clicks" Mobility   Outcome Measure  Help needed turning from your back to your side while in a flat bed without using bedrails?: A Lot Help needed moving from lying on your back to sitting on the side of a flat bed without using bedrails?: A Lot Help needed moving to and from a bed to a chair (including a wheelchair)?: Total Help needed standing up from a chair using your arms (e.g., wheelchair or bedside chair)?: Total Help needed to walk in hospital room?: Total Help needed climbing 3-5 steps with a railing? : Total 6 Click Score: 8    End of Session Equipment Utilized During Treatment: Gait belt Activity Tolerance: Patient tolerated treatment well Patient left: in chair;with call bell/phone within reach;with family/visitor present (left with STEDY blocking knees b/c pt felt more secure (RN aware) and wanted to sit upright) Nurse Communication: Mobility status;Need for lift equipment (Heavy +2 w STEDY vs Maxi MOVE) PT Visit Diagnosis: History of falling (Z91.81);Muscle weakness (generalized) (M62.81);Unsteadiness on feet  (R26.81);Difficulty in walking, not elsewhere classified (R26.2);Dizziness and giddiness (R42)     Time: 7681-1572 PT Time Calculation (min) (ACUTE ONLY): 32 min  Charges:  $Therapeutic Activity: 8-22 mins                    Blondell Reveal Kistler PT 07/27/2020  Acute Rehabilitation Services Pager [phone] Office [phone]

## 2020-07-27 NOTE — Progress Notes (Signed)
Report given to [place] at [facility] for discharge.

## 2020-07-27 NOTE — Progress Notes (Signed)
Pt sitting in chair after PT assessment.  Tolerated well.  Changed MID back dressing to skin ulcer.  Granulation tissue, cleansed with saline, and covered with new foam dressing.  Rt elbow has very small skin tear on anterior side.  Cleansed with saline and placed vas gauze and foam dressing on top.  Tolerated well.

## 2020-08-01 NOTE — Progress Notes (Signed)
Cardiology Clinic Note   Patient Name: Adrian Singh Date of Encounter: 08/03/2020  Primary Care Provider:  [name], MD Primary Cardiologist:  Adrian Martinique, MD  Patient Profile    Adrian Singh 86 year old male presents to the clinic today for follow-up evaluation of his essential hypertension, PAF, and CAD.  Past Medical History    Past Medical History:  Diagnosis Date  . Arthritis    "legs, back" (10/06/2015)  . Atrial fibrillation ([place])    x1  . Atrial fibrillation ([place])   . CAD (coronary artery disease)    nonobstructive  . Cancer ([place])    skin cancer on ear (froze it off) and back (cut it off)  . Dysrhythmia   . Esophageal reflux    hx of  . History of kidney stones   . Hx of heart artery stent   . Hypertrophy of prostate without urinary obstruction and other lower urinary tract symptoms (LUTS)   . Osteoarthrosis, unspecified whether generalized or localized, unspecified site   . Prostate cancer ([place])   . Skin cancer   . Stroke [facility]) 03/2014   "had a series of mini strokes; maybe 4"; denies residual on 10/06/2015  . Thrombocytopenia ([place])   . Type II diabetes mellitus ([place])    type 2  . Unspecified essential hypertension    Past Surgical History:  Procedure Laterality Date  . BACK SURGERY     2001  . CARDIAC CATHETERIZATION  12/2007  . CARDIAC CATHETERIZATION N/A 09/22/2015   Procedure: Left Heart Cath and Coronary Angiography;  Surgeon: Adrian M Martinique, MD;  Location: [place] CV LAB;  Service: Cardiovascular;  Laterality: N/A;  . CARDIAC CATHETERIZATION N/A 10/06/2015   Procedure: Coronary Stent Intervention;  Surgeon: Adrian M Martinique, MD;  Location: [place] CV LAB;  Service: Cardiovascular;  Laterality: N/A;  . CARDIAC CATHETERIZATION N/A 02/29/2016   Procedure: Left Heart Cath and Coronary Angiography;  Surgeon: Adrian Booze, MD;  Location: [place] CV LAB;  Service: Cardiovascular;  Laterality: N/A;  . COLONOSCOPY W/ BIOPSIES AND  POLYPECTOMY    . CORONARY STENT PLACEMENT  10/06/2015   [name]  with DES  . EAR CYST EXCISION N/A 04/06/2015   Procedure: EXCISION OF SCALP CYST;  Surgeon: Adrian Mesa, MD;  Location: [place];  Service: General;  Laterality: N/A;  . ESOPHAGOGASTRODUODENOSCOPY (EGD) WITH ESOPHAGEAL DILATION  2001   with dilation  . LUMBAR DISC SURGERY  09/26/1999   "cleaned out arthritis and bone spurs"  . SHOULDER ARTHROSCOPY W/ ROTATOR CUFF REPAIR Left 2005  . THULIUM LASER Singh (TRANSURETHRAL RESECTION OF PROSTATE) N/A 01/21/2017   Procedure: Adrian Singh (TRANSURETHRAL RESECTION OF PROSTATE) CAUTERIZATION OF BLADDER LESION;  Surgeon: Adrian Gallo, MD;  Location: [facility];  Service: Urology;  Laterality: N/A;    Allergies  Allergies  Allergen Reactions  . Soma [Carisoprodol] Other (See Comments)    "did a number on me"  . Doxazosin Other (See Comments)    dizziness    History of Present Illness    Adrian Singh is a PMH of essential hypertension, PAF, CAD status post PCA, orthostatic hypotension, aortic atherosclerosis, CVA, A. fib with RVR, chronic systolic and diastolic CHF, thrombocytopenia, CKD stage III, hypokalemia, prolonged QT interval, and DNR.  He recently had a palliative care consult 07/25/2020.  He has a known malignant neoplasm of his prostate with metastasis to his bone.  He was admitted to the hospital on 07/17/2020 and discharged on 07/22/2020.  He  was deemed not to be a candidate for further prostate cancer therapy due to his severe decline in functional status.  His CODE STATUS was changed to DNR at that time.  He had recently been admitted to the hospital with pneumonia and uncontrolled fibrillation.  He was discharged on 07/15/2020.  Upon returning home he had progressive weakness, poor oral intake and mid back pain.  Due to his rapid and progressive deconditioning he was taken back to the hospital by his family members.  He received supportive medical therapy with IV fluids and  electrolyte supplementation.  He was seen by oncology, they made no further recommendations for cancer treatment due to poor functional status and his advanced disease.  He presents the clinic today for follow-up evaluation states his cancer treatment has been stopped.  The oncology physicians felt that he was no longer a good candidate to receive cancer treatment.  He states that it is hard for him to sit up for extended periods of time due to pain in his pelvis.  He remains in good spirits.  He states he continues to take physical therapy at the nursing facility.  The physical therapist have found that if he continues to lay down he is able to perform therapy activities.  I have reviewed his medication and will have him return as needed.  Today he denies chest pain, increased shortness of breath, lower extremity edema, fatigue, palpitations, melena, hematuria, hemoptysis, diaphoresis, presyncope, syncope, orthopnea, and PND.    Home Medications    Prior to Admission medications   Medication Sig Start Date End Date Taking? Authorizing Provider  acetaminophen (TYLENOL) 325 MG tablet Take 650 mg by mouth every 12 (twelve) hours as needed for mild pain or moderate pain.    [provider]  Ascorbic Acid (VITAMIN C) 1000 MG tablet Take 1,000 mg by mouth daily.    [provider]  atorvastatin (LIPITOR) 40 MG tablet Take 20 mg by mouth daily.    [provider]  B Complex Vitamins (VITAMIN B COMPLEX PO) Take 1 tablet by mouth daily.    [provider]  BAYER MICROLET LANCETS lancets Use to test blood sugar once daily dx: E11.40 10/18/15   Adrian Carbon, MD  Blood Glucose Monitoring Suppl (CONTOUR NEXT EZ MONITOR) w/Device KIT USE TO TEST BLOOD SUGAR ONCE DAILY. Dx Code E11.40 06/13/16   Viviana Simpler I, MD  calcium carbonate (CALCIUM 600) 1500 (600 Ca) MG TABS tablet Take 600 mg of elemental calcium by mouth daily.     [provider]  cyanocobalamin  1000 MCG tablet Take 1 tablet (1,000 mcg total) by mouth at bedtime. 07/14/20   Barb Merino, MD  Ensure Plus (ENSURE PLUS) LIQD Take 237 mLs by mouth daily as needed (suppliment).    [provider]  ergocalciferol (VITAMIN D2) 1.25 MG (50000 UT) capsule Take 50,000 Units by mouth once a week. q Saturday    [provider]  furosemide (LASIX) 40 MG tablet Take 40 mg by mouth daily as needed for fluid.     [provider]  glucose blood (CONTOUR NEXT TEST) test strip Check blood sugar once daily and as directed.Dx Code E11.40 03/19/19   Viviana Simpler I, MD  lidocaine (LIDODERM) 5 % Place 1 patch onto the skin daily. Remove & Discard patch within 12 hours or as directed by MD 07/27/20   Arrien, Jimmy Picket, MD  metoprolol tartrate (LOPRESSOR) 50 MG tablet Take 1 tablet (50 mg total) by  mouth in the morning, at noon, and at bedtime. 07/15/20 07/15/21  Barb Merino, MD  Multiple Vitamin (MULTIVITAMIN WITH MINERALS) TABS tablet Take 1 tablet by mouth daily.     [provider]  nitroGLYCERIN (NITROSTAT) 0.4 MG SL tablet Place 1 tablet (0.4 mg total) under the tongue every 5 (five) minutes as needed for chest pain. Patient taking differently: Place 0.4 mg under the tongue every 5 (five) minutes as needed for chest pain. If no relief, call MD 05/15/18   Adrian Carbon, MD  polyethylene glycol (MIRALAX / GLYCOLAX) 17 g packet Take 17 g by mouth daily as needed for mild constipation.     [provider]  traMADol (ULTRAM) 50 MG tablet Take 1 tablet (50 mg total) by mouth every 6 (six) hours as needed for moderate pain. 07/22/20   Arrien, Jimmy Picket, MD    Family History    Family History  Problem Relation Age of Onset  . Stroke Father   . Peripheral vascular disease Father        amputation  . Heart failure Mother        CHF  . Coronary artery disease Mother   . Heart attack Mother 48       Multiple  . COPD Brother   . Heart disease  Brother   . Cancer Brother        Bone  . Lung cancer Sister    He indicated that his mother is deceased. He indicated that his father is deceased. He indicated that three of his five sisters are alive. He indicated that three of his eight brothers are alive. He indicated that his maternal grandmother is deceased. He indicated that his maternal grandfather is deceased. He indicated that his paternal grandmother is deceased. He indicated that his paternal grandfather is deceased. He indicated that his son is deceased. He indicated that all of his three children are alive.  Social History    Social History   Socioeconomic History  . Marital status: Married    Spouse name: Not on file  . Number of children: 4  . Years of education: Not on file  . Highest education level: Not on file  Occupational History  . Occupation: Radiographer, 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[employer]    Employer: RETIRED  Tobacco Use  . Smoking status: Former Smoker    Years: 3.00    Types: Cigarettes  . Smokeless tobacco: Never Used  . Tobacco comment: " Quit smoking by age 10; was a someday smoker "  Vaping Use  . Vaping Use: Never used  Substance and Sexual Activity  . Alcohol use: No  . Drug use: No  . Sexual activity: Never  Other Topics Concern  . Not on file  Social History Narrative   No living will   Plans wife and then children to make health care decisions for him if unable   Would request at least attempts at resuscitation but no prolonged life support   Doesn't think he would want tube feeds if cognitively unaware   Social Determinants of Health   Financial Resource Strain:   . Difficulty of Paying Living Expenses: Not on file  Food Insecurity:   . Worried About Charity fundraiser in the Last Year: Not on file  . Ran Out of Food in the Last Year: Not on file  Transportation Needs:   . Lack of Transportation (Medical): Not on file  . Lack of Transportation (Non-Medical): Not on file  Physical Activity:   .  Days of Exercise per Week: Not on file  . Minutes of Exercise per Session: Not on file  Stress:   . Feeling of Stress : Not on file  Social Connections:   . Frequency of Communication with Friends and Family: Not on file  . Frequency of Social Gatherings with Friends and Family: Not on file  . Attends Religious Services: Not on file  . Active Member of Clubs or Organizations: Not on file  . Attends Archivist Meetings: Not on file  . Marital Status: Not on file  Intimate Partner Violence:   . Fear of Current or Ex-Partner: Not on file  . Emotionally Abused: Not on file  . Physically Abused: Not on file  . Sexually Abused: Not on file     Review of Systems    General:  No chills, fever, night sweats or weight changes.  Cardiovascular:  No chest pain, dyspnea on exertion, edema, orthopnea, palpitations, paroxysmal nocturnal dyspnea. Dermatological: No rash, lesions/masses Respiratory: No cough, dyspnea Urologic: No hematuria, dysuria Abdominal:   No nausea, vomiting, diarrhea, bright red blood per rectum, melena, or hematemesis Neurologic:  No visual changes, wkns, changes in mental status. All other systems reviewed and are otherwise negative except as noted above.  Physical Exam    VS:  BP (!) 99/55   Pulse 74   Ht _0  (1.803 m)   SpO2 100%   BMI 19.89 kg/m  , BMI Body mass index is 19.89 kg/m. GEN: Well nourished, well developed, in no acute distress. HEENT: normal. Neck: Supple, no JVD, carotid bruits, or masses. Cardiac: RRR, no murmurs, rubs, or gallops. No clubbing, cyanosis, edema.  Radials/DP/PT 2+ and equal bilaterally.  Respiratory:  Respirations regular and unlabored, clear to auscultation bilaterally. GI: Soft, nontender, nondistended, BS + x 4. MS: no deformity or atrophy. Skin: warm and dry, no rash. Neuro:  Strength and sensation are intact. Psych: Normal affect.  Accessory Clinical Findings    Recent Labs: 07/05/2020: TSH  3.748 07/17/2020: B Natriuretic Peptide 1,002.0 07/20/2020: ALT 13; Hemoglobin 8.5; Platelets 93 07/21/2020: BUN 7; Creatinine, Ser 0.58; Magnesium 2.2; Potassium 4.2; Sodium 139   Recent Lipid Panel    Component Value Date/Time   CHOL 67 07/09/2020 0508   TRIG 65 07/09/2020 0508   HDL 23 (L) 07/09/2020 0508   CHOLHDL 2.9 07/09/2020 0508   VLDL 13 07/09/2020 0508   LDLCALC 31 07/09/2020 0508   LDLDIRECT 77.0 03/28/2015 1133    ECG personally reviewed by me today-sinus rhythm with premature atrial complexes inferior infarct undetermined age ST and T wave abnormality consider lateral ischemia 74 bpm-no acute changes  Echocardiogram 07/20/2020 IMPRESSIONS    1. Left ventricular ejection fraction, by estimation, is 55%. The left  ventricle has normal function. The left ventricle demonstrates regional  wall motion abnormalities (see scoring diagram/findings for description).  There is mild left ventricular  hypertrophy. Left ventricular diastolic parameters are consistent with  Grade II diastolic dysfunction (pseudonormalization). Elevated left  ventricular end-diastolic pressure.  2. Right ventricular systolic function is normal. The right ventricular  size is normal.  3. Left atrial size was severely dilated.  4. The mitral valve is grossly normal. Mild mitral valve regurgitation.  No evidence of mitral stenosis.  5. The aortic valve is grossly normal. Aortic valve regurgitation is  trivial. No aortic stenosis is present.    Assessment & Plan   1.  Chronic  diastolic CHF-continues to decline due to  his prostate CA metastasis to his bone.  Echocardiogram showed EF of 55% with G2 DD.  After recent admission he was discharged with as needed furosemide 40 mg daily. Continue furosemide, metoprolol Regular diet-encouraged to eat whatever he might enjoy.  Paroxysmal atrial fibrillation/prolonged QT interval-heart rate today 74 bpm.  Amiodarone previously stopped due to prolonged  QT interval.  No anticoagulation due to fall risk Continue metoprolol Avoid triggers caffeine, nicotine, chocolate, EtOH etc.  Prostate CA-has progressed.  Now with metastasis to his bone.  Had 2 recent admissions requiring supplemental IV fluids and electrolyte replacement.  He was also receiving morphine and tramadol for pain control.  Oncology is indicated that he is no longer a candidate for cancer treatment due to his functional decline. Follows with palliative care.  Disposition: Follow-up as needed     Jossie Ng. Terita Hejl NP-C    08/03/2020, 2:49 PM [address] Office [phone] Fax [phone]  Notice: This dictation was prepared with Dragon dictation along with smaller phrase technology. Any transcriptional errors that result from this process are unintentional and may not be corrected upon review.

## 2020-08-03 ENCOUNTER — Other Ambulatory Visit: Payer: Self-pay

## 2020-08-03 ENCOUNTER — Encounter: Payer: Self-pay | Admitting: General Practice

## 2020-08-03 ENCOUNTER — Ambulatory Visit (INDEPENDENT_AMBULATORY_CARE_PROVIDER_SITE_OTHER): Payer: Medicare Other | Admitting: General Practice

## 2020-08-03 VITALS — BP 99/55 | HR 74 | Ht 71.0 in

## 2020-08-03 DIAGNOSIS — C61 Malignant neoplasm of prostate: Secondary | ICD-10-CM | POA: Diagnosis not present

## 2020-08-03 DIAGNOSIS — I5032 Chronic diastolic (congestive) heart failure: Secondary | ICD-10-CM | POA: Diagnosis not present

## 2020-08-03 DIAGNOSIS — E119 Type 2 diabetes mellitus without complications: Secondary | ICD-10-CM | POA: Diagnosis not present

## 2020-08-03 DIAGNOSIS — E876 Hypokalemia: Secondary | ICD-10-CM | POA: Diagnosis not present

## 2020-08-03 DIAGNOSIS — I480 Paroxysmal atrial fibrillation: Secondary | ICD-10-CM | POA: Diagnosis not present

## 2020-08-03 DIAGNOSIS — R5381 Other malaise: Secondary | ICD-10-CM | POA: Diagnosis not present

## 2020-08-03 DIAGNOSIS — I2510 Atherosclerotic heart disease of native coronary artery without angina pectoris: Secondary | ICD-10-CM | POA: Diagnosis not present

## 2020-08-03 NOTE — Patient Instructions (Signed)
Medication Instructions:  The current medical regimen is effective;  continue present plan and medications as directed. Please refer to the Current Medication list given to you today.  *If you need a refill on your cardiac medications before your next appointment, please call your pharmacy*  Lab Work:   Testing/Procedures:  NONE    NONE  Follow-Up: Your next appointment:   FOLLOW UP-AS NEEDED  with Peter Martinique, MD OR IF UNAVAILABLE [place], FNP-C   At [facility], you and your health needs are our priority.  As part of our continuing mission to provide you with exceptional heart care, we have created designated Provider Care Teams.  These Care Teams include your primary Cardiologist (physician) and Advanced Practice Providers (APPs -  Physician Assistants and Nurse Practitioners) who all work together to provide you with the care you need, when you need it.

## 2020-08-03 NOTE — Addendum Note (Signed)
Addended by: Wonda Horner on: 08/03/2020 03:56 PM   Modules accepted: Orders

## 2020-08-10 DIAGNOSIS — E119 Type 2 diabetes mellitus without complications: Secondary | ICD-10-CM | POA: Diagnosis not present

## 2020-08-10 DIAGNOSIS — R5381 Other malaise: Secondary | ICD-10-CM | POA: Diagnosis not present

## 2020-08-10 DIAGNOSIS — I2510 Atherosclerotic heart disease of native coronary artery without angina pectoris: Secondary | ICD-10-CM | POA: Diagnosis not present

## 2020-08-10 DIAGNOSIS — C7982 Secondary malignant neoplasm of genital organs: Secondary | ICD-10-CM | POA: Diagnosis not present

## 2020-08-10 NOTE — Addendum Note (Signed)
Addended by: Waylan Rocher on: 08/10/2020 08:06 AM   Modules accepted: Orders

## 2020-08-14 DIAGNOSIS — R059 Cough, unspecified: Secondary | ICD-10-CM | POA: Diagnosis not present

## 2020-08-14 DIAGNOSIS — R0602 Shortness of breath: Secondary | ICD-10-CM | POA: Diagnosis not present

## 2020-08-17 ENCOUNTER — Emergency Department (HOSPITAL_COMMUNITY): Payer: Medicare Other

## 2020-08-17 ENCOUNTER — Inpatient Hospital Stay (HOSPITAL_COMMUNITY)
Admission: EM | Admit: 2020-08-17 | Discharge: 2020-08-20 | DRG: 391 | Disposition: A | Discharge status: Hospice | Payer: Medicare Other | Source: Skilled Nursing Facility | Attending: Internal Medicine | Admitting: Internal Medicine

## 2020-08-17 ENCOUNTER — Non-Acute Institutional Stay: Payer: Medicare Other | Admitting: Internal Medicine

## 2020-08-17 ENCOUNTER — Other Ambulatory Visit: Payer: Self-pay

## 2020-08-17 ENCOUNTER — Encounter (HOSPITAL_COMMUNITY): Payer: Self-pay

## 2020-08-17 DIAGNOSIS — Z825 Family history of asthma and other chronic lower respiratory diseases: Secondary | ICD-10-CM

## 2020-08-17 DIAGNOSIS — E43 Unspecified severe protein-calorie malnutrition: Secondary | ICD-10-CM | POA: Diagnosis present

## 2020-08-17 DIAGNOSIS — R100 Acute abdomen: Secondary | ICD-10-CM | POA: Diagnosis not present

## 2020-08-17 DIAGNOSIS — C7982 Secondary malignant neoplasm of genital organs: Secondary | ICD-10-CM | POA: Diagnosis not present

## 2020-08-17 DIAGNOSIS — Z515 Encounter for palliative care: Secondary | ICD-10-CM

## 2020-08-17 DIAGNOSIS — E876 Hypokalemia: Secondary | ICD-10-CM | POA: Diagnosis present

## 2020-08-17 DIAGNOSIS — K5289 Other specified noninfective gastroenteritis and colitis: Secondary | ICD-10-CM | POA: Diagnosis present

## 2020-08-17 DIAGNOSIS — R627 Adult failure to thrive: Secondary | ICD-10-CM | POA: Diagnosis present

## 2020-08-17 DIAGNOSIS — Z681 Body mass index (BMI) 19 or less, adult: Secondary | ICD-10-CM

## 2020-08-17 DIAGNOSIS — I5032 Chronic diastolic (congestive) heart failure: Secondary | ICD-10-CM | POA: Diagnosis present

## 2020-08-17 DIAGNOSIS — I4581 Long QT syndrome: Secondary | ICD-10-CM | POA: Diagnosis present

## 2020-08-17 DIAGNOSIS — G893 Neoplasm related pain (acute) (chronic): Secondary | ICD-10-CM | POA: Diagnosis present

## 2020-08-17 DIAGNOSIS — E869 Volume depletion, unspecified: Secondary | ICD-10-CM | POA: Diagnosis present

## 2020-08-17 DIAGNOSIS — I110 Hypertensive heart disease with heart failure: Secondary | ICD-10-CM | POA: Diagnosis present

## 2020-08-17 DIAGNOSIS — R109 Unspecified abdominal pain: Secondary | ICD-10-CM | POA: Diagnosis present

## 2020-08-17 DIAGNOSIS — Z888 Allergy status to other drugs, medicaments and biological substances status: Secondary | ICD-10-CM

## 2020-08-17 DIAGNOSIS — J90 Pleural effusion, not elsewhere classified: Secondary | ICD-10-CM | POA: Diagnosis not present

## 2020-08-17 DIAGNOSIS — R0902 Hypoxemia: Secondary | ICD-10-CM | POA: Diagnosis not present

## 2020-08-17 DIAGNOSIS — D696 Thrombocytopenia, unspecified: Secondary | ICD-10-CM | POA: Diagnosis present

## 2020-08-17 DIAGNOSIS — R1031 Right lower quadrant pain: Secondary | ICD-10-CM

## 2020-08-17 DIAGNOSIS — I2510 Atherosclerotic heart disease of native coronary artery without angina pectoris: Secondary | ICD-10-CM | POA: Diagnosis present

## 2020-08-17 DIAGNOSIS — Z8546 Personal history of malignant neoplasm of prostate: Secondary | ICD-10-CM

## 2020-08-17 DIAGNOSIS — Z8673 Personal history of transient ischemic attack (TIA), and cerebral infarction without residual deficits: Secondary | ICD-10-CM

## 2020-08-17 DIAGNOSIS — R9431 Abnormal electrocardiogram [ECG] [EKG]: Secondary | ICD-10-CM | POA: Diagnosis not present

## 2020-08-17 DIAGNOSIS — C7951 Secondary malignant neoplasm of bone: Secondary | ICD-10-CM

## 2020-08-17 DIAGNOSIS — Z66 Do not resuscitate: Secondary | ICD-10-CM | POA: Diagnosis present

## 2020-08-17 DIAGNOSIS — D649 Anemia, unspecified: Secondary | ICD-10-CM | POA: Diagnosis present

## 2020-08-17 DIAGNOSIS — R197 Diarrhea, unspecified: Secondary | ICD-10-CM | POA: Diagnosis not present

## 2020-08-17 DIAGNOSIS — I480 Paroxysmal atrial fibrillation: Secondary | ICD-10-CM | POA: Diagnosis present

## 2020-08-17 DIAGNOSIS — I700 Atherosclerosis of aorta: Secondary | ICD-10-CM | POA: Diagnosis not present

## 2020-08-17 DIAGNOSIS — K5900 Constipation, unspecified: Secondary | ICD-10-CM

## 2020-08-17 DIAGNOSIS — Z87891 Personal history of nicotine dependence: Secondary | ICD-10-CM

## 2020-08-17 DIAGNOSIS — R1084 Generalized abdominal pain: Secondary | ICD-10-CM | POA: Diagnosis not present

## 2020-08-17 DIAGNOSIS — E119 Type 2 diabetes mellitus without complications: Secondary | ICD-10-CM | POA: Diagnosis present

## 2020-08-17 DIAGNOSIS — C61 Malignant neoplasm of prostate: Secondary | ICD-10-CM

## 2020-08-17 DIAGNOSIS — K567 Ileus, unspecified: Secondary | ICD-10-CM | POA: Diagnosis not present

## 2020-08-17 DIAGNOSIS — Z79899 Other long term (current) drug therapy: Secondary | ICD-10-CM

## 2020-08-17 DIAGNOSIS — Z955 Presence of coronary angioplasty implant and graft: Secondary | ICD-10-CM

## 2020-08-17 DIAGNOSIS — Z7189 Other specified counseling: Secondary | ICD-10-CM

## 2020-08-17 DIAGNOSIS — R54 Age-related physical debility: Secondary | ICD-10-CM | POA: Diagnosis present

## 2020-08-17 DIAGNOSIS — D630 Anemia in neoplastic disease: Secondary | ICD-10-CM | POA: Diagnosis present

## 2020-08-17 DIAGNOSIS — R000 Tachycardia, unspecified: Secondary | ICD-10-CM | POA: Diagnosis not present

## 2020-08-17 DIAGNOSIS — R64 Cachexia: Secondary | ICD-10-CM | POA: Diagnosis present

## 2020-08-17 DIAGNOSIS — K5981 Ogilvie syndrome: Principal | ICD-10-CM | POA: Diagnosis present

## 2020-08-17 DIAGNOSIS — R1011 Right upper quadrant pain: Secondary | ICD-10-CM

## 2020-08-17 DIAGNOSIS — Z823 Family history of stroke: Secondary | ICD-10-CM

## 2020-08-17 DIAGNOSIS — Z9079 Acquired absence of other genital organ(s): Secondary | ICD-10-CM

## 2020-08-17 DIAGNOSIS — K8020 Calculus of gallbladder without cholecystitis without obstruction: Secondary | ICD-10-CM | POA: Diagnosis not present

## 2020-08-17 DIAGNOSIS — Z801 Family history of malignant neoplasm of trachea, bronchus and lung: Secondary | ICD-10-CM

## 2020-08-17 DIAGNOSIS — R110 Nausea: Secondary | ICD-10-CM | POA: Diagnosis not present

## 2020-08-17 DIAGNOSIS — Z20822 Contact with and (suspected) exposure to covid-19: Secondary | ICD-10-CM | POA: Diagnosis present

## 2020-08-17 DIAGNOSIS — R231 Pallor: Secondary | ICD-10-CM | POA: Diagnosis not present

## 2020-08-17 DIAGNOSIS — Z87442 Personal history of urinary calculi: Secondary | ICD-10-CM

## 2020-08-17 DIAGNOSIS — Z8249 Family history of ischemic heart disease and other diseases of the circulatory system: Secondary | ICD-10-CM

## 2020-08-17 DIAGNOSIS — Z85828 Personal history of other malignant neoplasm of skin: Secondary | ICD-10-CM

## 2020-08-17 LAB — LACTIC ACID, PLASMA: Lactic Acid, Venous: 1.2 mmol/L (ref 0.5–1.9)

## 2020-08-17 LAB — CBC WITH DIFFERENTIAL/PLATELET
Abs Immature Granulocytes: 0.73 10*3/uL — ABNORMAL HIGH (ref 0.00–0.07)
Basophils Absolute: 0 10*3/uL (ref 0.0–0.1)
Basophils Relative: 0 %
Eosinophils Absolute: 0 10*3/uL (ref 0.0–0.5)
Eosinophils Relative: 0 %
HCT: 24.4 % — ABNORMAL LOW (ref 39.0–52.0)
Hemoglobin: 7.4 g/dL — ABNORMAL LOW (ref 13.0–17.0)
Immature Granulocytes: 11 %
Lymphocytes Relative: 19 %
Lymphs Abs: 1.3 10*3/uL (ref 0.7–4.0)
MCH: 25.3 pg — ABNORMAL LOW (ref 26.0–34.0)
MCHC: 30.3 g/dL (ref 30.0–36.0)
MCV: 83.6 fL (ref 80.0–100.0)
Monocytes Absolute: 0.4 10*3/uL (ref 0.1–1.0)
Monocytes Relative: 6 %
Neutro Abs: 4.4 10*3/uL (ref 1.7–7.7)
Neutrophils Relative %: 64 %
Platelets: 78 10*3/uL — ABNORMAL LOW (ref 150–400)
RBC: 2.92 MIL/uL — ABNORMAL LOW (ref 4.22–5.81)
RDW: 20.9 % — ABNORMAL HIGH (ref 11.5–15.5)
WBC: 6.8 10*3/uL (ref 4.0–10.5)
nRBC: 2.2 % — ABNORMAL HIGH (ref 0.0–0.2)

## 2020-08-17 LAB — MAGNESIUM: Magnesium: 3.1 mg/dL — ABNORMAL HIGH (ref 1.7–2.4)

## 2020-08-17 LAB — COMPREHENSIVE METABOLIC PANEL
ALT: 11 U/L (ref 0–44)
AST: 84 U/L — ABNORMAL HIGH (ref 15–41)
Albumin: 2 g/dL — ABNORMAL LOW (ref 3.5–5.0)
Alkaline Phosphatase: 693 U/L — ABNORMAL HIGH (ref 38–126)
Anion gap: 13 (ref 5–15)
BUN: 15 mg/dL (ref 8–23)
CO2: 29 mmol/L (ref 22–32)
Calcium: 7.8 mg/dL — ABNORMAL LOW (ref 8.9–10.3)
Chloride: 100 mmol/L (ref 98–111)
Creatinine, Ser: 0.62 mg/dL (ref 0.61–1.24)
GFR, Estimated: >60 mL/min (ref >60)
Glucose, Bld: 153 mg/dL — ABNORMAL HIGH (ref 70–99)
Potassium: <2 mmol/L — CL (ref 3.5–5.1)
Sodium: 142 mmol/L (ref 135–145)
Total Bilirubin: 0.7 mg/dL (ref 0.3–1.2)
Total Protein: 6.2 g/dL — ABNORMAL LOW (ref 6.5–8.1)

## 2020-08-17 LAB — RESP PANEL BY RT-PCR (FLU A&B, COVID) ARPGX2
Influenza A by PCR: NEGATIVE
Influenza B by PCR: NEGATIVE
SARS Coronavirus 2 by RT PCR: NEGATIVE

## 2020-08-17 LAB — TYPE AND SCREEN
ABO/RH(D): B NEG
Antibody Screen: NEGATIVE

## 2020-08-17 LAB — PHOSPHORUS: Phosphorus: 2.9 mg/dL (ref 2.5–4.6)

## 2020-08-17 MED ORDER — MAGNESIUM SULFATE 2 GM/50ML IV SOLN
2.0000 g | Freq: Once | INTRAVENOUS | Status: AC
  Administered 2020-08-17: 2 g via INTRAVENOUS
  Filled 2020-08-17: qty 50

## 2020-08-17 MED ORDER — IOHEXOL 300 MG/ML  SOLN
100.0000 mL | Freq: Once | INTRAMUSCULAR | Status: AC | PRN
  Administered 2020-08-17: 100 mL via INTRAVENOUS

## 2020-08-17 MED ORDER — POTASSIUM CHLORIDE 10 MEQ/100ML IV SOLN
10.0000 meq | INTRAVENOUS | Status: AC
  Administered 2020-08-17 – 2020-08-18 (×6): 10 meq via INTRAVENOUS
  Filled 2020-08-17 (×6): qty 100

## 2020-08-17 MED ORDER — ACETAMINOPHEN 325 MG PO TABS: 650.0000 mg | ORAL_TABLET | Freq: Four times a day (QID) | ORAL | Status: DC | PRN

## 2020-08-17 MED ORDER — SODIUM CHLORIDE 0.9 % IV BOLUS
500.0000 mL | Freq: Once | INTRAVENOUS | Status: AC
  Administered 2020-08-17: 500 mL via INTRAVENOUS

## 2020-08-17 MED ORDER — POTASSIUM CHLORIDE CRYS ER 20 MEQ PO TBCR
40.0000 meq | EXTENDED_RELEASE_TABLET | Freq: Once | ORAL | Status: AC
  Administered 2020-08-17: 40 meq via ORAL
  Filled 2020-08-17: qty 2

## 2020-08-17 MED ORDER — FENTANYL CITRATE (PF) 100 MCG/2ML IJ SOLN
50.0000 ug | Freq: Once | INTRAMUSCULAR | Status: AC
  Administered 2020-08-17: 50 ug via INTRAVENOUS
  Filled 2020-08-17: qty 2

## 2020-08-17 MED ORDER — ACETAMINOPHEN 650 MG RE SUPP: 650.0000 mg | Freq: Four times a day (QID) | RECTAL | Status: DC | PRN

## 2020-08-17 MED ORDER — FENTANYL CITRATE (PF) 100 MCG/2ML IJ SOLN
12.5000 ug | INTRAMUSCULAR | Status: DC | PRN
  Administered 2020-08-17: 12.5 ug via INTRAVENOUS
  Administered 2020-08-18 (×2): 50 ug via INTRAVENOUS
  Administered 2020-08-18: 12.5 ug via INTRAVENOUS
  Administered 2020-08-18: 50 ug via INTRAVENOUS
  Filled 2020-08-17 (×5): qty 2

## 2020-08-17 MED ORDER — LACTATED RINGERS IV SOLN: INTRAVENOUS | Status: DC

## 2020-08-17 NOTE — Progress Notes (Addendum)
Adrian Singh Consult Note Telephone: [phone]  Fax: [phone]  PATIENT NAME: Adrian Singh DOB: Nov 05, 1933 MRN: [id number]  PRIMARY CARE PROVIDER:   [name], MD  REFERRING PROVIDER:  Dr. Pernell Dupre  RESPONSIBLE PARTY:   Self     RECOMMENDATIONS and PLAN:  Palliative care encounter Z51.5  1.  Advance care planning:  Explanation of palliative and hospice care to patient and daughter Morene Rankins who was in attendance.  Pt's current goals of to have treatment and surgery if necessary to releive new onset R upper and lower abdomen pain.  He desires to be transferred to the hospital today for further evaluation.  Advanced directives were reviewed with delegations of attempt CPR, Limited additional interventions(pt. Does not desire intubation for life prolonging measures other than surgery if needed.  He does not desire life support.  He is in agreement with IV fluids and antibiotics if indicated but no tube feedings.  MOST form was completed accordingly and uploaded into [facility].  No current healthcare POA.  Patient is his own decision maker with the assistance of his wife and adult children.  A blank advanced directive/HCPOA document was given to daughter for completion at the hospital if he is admitted.  Patient is in agreement with this plan.  This NP will have hospital liaison follow patient's status while at hospital. Palliative care will f/u with patient upon his return to the facility to readdress complex medical management.  It may be necessary for the inpatient palliative team to follow patient if hospitalized.   2.  Abdominal pain:  Patient will be transported to local hospital for evaluation and treatment via EMS.  3.  Metastatic prostate cancer to bone:  No plans for chemotherapy or radiation.  Additional discussion with patient and family will be necessary after evaluation of cause of today's abdominal pain. Consider  transition to Hospice care for comfort measures when patient is eligible.   4.  Weight loss:  Related to metastatic disease.  No desire for feeding tube placement.  Comfort feedings.    I spent 60 minutes providing this consultation,  from 1230 to 1330. More than 50% of the time in this consultation was spent coordinating communication with clinical staff, patient and daughter [name].   HISTORY OF PRESENT ILLNESS:  Adrian Singh is a 86 y.o. year old male with multiple medical problems including CAD with stents, metastatic prostate cancer to bones(spine, ribs, pelvis and scapula).  Pt reports that he has lost aprox 70# over last 12 months.  His today's weight is 135.6 with height of 5'10"(BMI is 19.5) Previous weights are as follows. 06/2020= 142; 04/2020=159#.  23# weight loss in past 3 months. Today, pt complains of nausea, acute onset R upper and lower abdominal pain(he thinks that it is related to his gallbladder and desires surgery if necessary), loss of appetite. Abdomen pain is rated as a 3-8/10, sharp and is worse with flexxion of knees. He is now non-ambulatory and is dependent on staff for completion of all ADLs.   Palliative Care was asked to help address goals of care.   CODE STATUS: Attempt CPR.  No desire for intubation  PPS: 20%  HOSPICE ELIGIBILITY/DIAGNOSIS: TBD  PAST MEDICAL HISTORY:  Past Medical History:  Diagnosis Date  . Arthritis    "legs, back" (10/06/2015)  . Atrial fibrillation ([place])    x1  . Atrial fibrillation ([place])   . CAD (coronary artery disease)  nonobstructive  . Cancer ([place])    skin cancer on ear (froze it off) and back (cut it off)  . Dysrhythmia   . Esophageal reflux    hx of  . History of kidney stones   . Hx of heart artery stent   . Hypertrophy of prostate without urinary obstruction and other lower urinary tract symptoms (LUTS)   . Osteoarthrosis, unspecified whether generalized or localized, unspecified site   . Prostate cancer ([place])   .  Skin cancer   . Stroke [facility]) 03/2014   "had a series of mini strokes; maybe 4"; denies residual on 10/06/2015  . Thrombocytopenia ([place])   . Type II diabetes mellitus ([place])    type 2  . Unspecified essential hypertension     SOCIAL HX: Resident at [facility], married. Social History   Tobacco Use  . Smoking status: Former Smoker    Years: 3.00    Types: Cigarettes  . Smokeless tobacco: Never Used  . Tobacco comment: " Quit smoking by age 89; was a someday smoker "  Substance Use Topics  . Alcohol use: No    ALLERGIES:  Allergies  Allergen Reactions  . Soma [Carisoprodol] Other (See Comments)    "did a number on me"  . Doxazosin Other (See Comments)    dizziness     PERTINENT MEDICATIONS:  Outpatient Encounter Medications as of 08/17/2020  Medication Sig  . acetaminophen (TYLENOL) 325 MG tablet Take 650 mg by mouth every 12 (twelve) hours as needed for mild pain or moderate pain.  . Ascorbic Acid (VITAMIN C) 1000 MG tablet Take 1,000 mg by mouth daily.  Marland Kitchen atorvastatin (LIPITOR) 40 MG tablet Take 20 mg by mouth daily.  . B Complex Vitamins (VITAMIN B COMPLEX PO) Take 1 tablet by mouth daily.  Marland Kitchen BAYER MICROLET LANCETS lancets Use to test blood sugar once daily dx: E11.40  . Blood Glucose Monitoring Suppl (CONTOUR NEXT EZ MONITOR) w/Device KIT USE TO TEST BLOOD SUGAR ONCE DAILY. Dx Code E11.40  . calcium carbonate (CALCIUM 600) 1500 (600 Ca) MG TABS tablet Take 600 mg of elemental calcium by mouth daily.   . cyanocobalamin 1000 MCG tablet Take 1 tablet (1,000 mcg total) by mouth at bedtime.  . Ensure Plus (ENSURE PLUS) LIQD Take 237 mLs by mouth daily as needed (suppliment).  . ergocalciferol (VITAMIN D2) 1.25 MG (50000 UT) capsule Take 50,000 Units by mouth once a week. q Saturday  . furosemide (LASIX) 40 MG tablet Take 40 mg by mouth daily as needed for fluid.   Marland Kitchen glucose blood (CONTOUR NEXT TEST) test strip Check blood sugar once daily and as directed.Dx Code E11.40  . lidocaine  (LIDODERM) 5 % Place 1 patch onto the skin daily. Remove & Discard patch within 12 hours or as directed by MD  . metoprolol tartrate (LOPRESSOR) 50 MG tablet Take 1 tablet (50 mg total) by mouth in the morning, at noon, and at bedtime.  . Multiple Vitamin (MULTIVITAMIN WITH MINERALS) TABS tablet Take 1 tablet by mouth daily.   . nitroGLYCERIN (NITROSTAT) 0.4 MG SL tablet Place 1 tablet (0.4 mg total) under the tongue every 5 (five) minutes as needed for chest pain. (Patient taking differently: Place 0.4 mg under the tongue every 5 (five) minutes as needed for chest pain. If no relief, call MD)  . polyethylene glycol (MIRALAX / GLYCOLAX) 17 g packet Take 17 g by mouth daily as needed for mild constipation.   . traMADol (ULTRAM) 50 MG tablet Take  1 tablet (50 mg total) by mouth every 6 (six) hours as needed for moderate pain.   No facility-administered encounter medications on file as of 08/17/2020.    PHYSICAL EXAM:   Vital signs:  BP 141/65 P 66 R 20 98%  General: Uncomfortable in appearance, frail appearing, cachectic Cardiovascular: regular rate and rhythm Pulmonary: clear ant fields, O2 via [place] in use Abdomen: tenderness of RUQ, RLQ, hypoactive bs, flat GU: no suprapubic tenderness Extremities: no edema Skin:pale in color Neurological: Generalized weakness.  Alert and oriented x 3 Psych:  Calm and cooperative    Gonzella [name], NP-C

## 2020-08-17 NOTE — ED Notes (Signed)
Pt repositioned in bed, pillow placed under R hip and under knees. Legs elevated.

## 2020-08-17 NOTE — ED Provider Notes (Signed)
[facility] DEPT Provider Note   CSN: [id number] Arrival date & time: 08/17/20  1600     History Chief Complaint  Patient presents with  . Abdominal Pain    Adrian Singh is a 86 y.o. male.  The history is provided by the patient, medical records and a relative.  Abdominal Pain  [name] is a 86 y.o. male who presents to the Emergency Department complaining of abdominal pain.  He presents to the ED accompanied by his daughter for evaluation of progressive abdominal pain and poor appetite.  Over the last year and a half he has had poor appetite and abdominal pain with eating.  Sxs significantly worsened over the last week to the point that he cannot eat. Pain is located in the central and right abdomen.  He has associated nausea and diarrhea.  No black/bloody stools.  Has occasional SOB.  Had a fever to 100.9 on Saturday.  Sxs are severe, constant, worsening.  He is currently at [facility] (long term care facility).      Past Medical History:  Diagnosis Date  . Arthritis    "legs, back" (10/06/2015)  . Atrial fibrillation ([place])    x1  . Atrial fibrillation ([place])   . CAD (coronary artery disease)    nonobstructive  . Cancer ([place])    skin cancer on ear (froze it off) and back (cut it off)  . Dysrhythmia   . Esophageal reflux    hx of  . History of kidney stones   . Hx of heart artery stent   . Hypertrophy of prostate without urinary obstruction and other lower urinary tract symptoms (LUTS)   . Osteoarthrosis, unspecified whether generalized or localized, unspecified site   . Prostate cancer ([place])   . Skin cancer   . Stroke [facility]) 03/2014   "had a series of mini strokes; maybe 4"; denies residual on 10/06/2015  . Thrombocytopenia ([place])   . Type II diabetes mellitus ([place])    type 2  . Unspecified essential hypertension     Patient Active Problem List   Diagnosis Date Noted  . Chronic diastolic CHF (congestive heart failure) ([place])  07/22/2020  . Failure to thrive in adult 07/17/2020  . Back pain 07/17/2020  . Prolonged QT interval 07/17/2020  . Chronic systolic CHF (congestive heart failure) ([place]) 07/17/2020  . Ileus ([place]) 07/05/2020  . Hypokalemia 07/05/2020  . Atrial fibrillation with RVR ([place]) 07/05/2020  . Postural dizziness with presyncope 06/09/2020  . Severe protein-calorie malnutrition ([place]) 05/06/2020  . Palliative care by specialist   . Goals of care, counseling/discussion   . DNR (do not resuscitate) discussion   . Adult failure to thrive   . Need for emotional support   . Pressure ulcer of back 05/04/2020  . Stroke ([place]) 05/02/2020  . General weakness 04/30/2020  . Fall at home, initial encounter 04/30/2020  . Anemia 04/30/2020  . Elevated CK 04/30/2020  . Chronic kidney disease, stage 3a ([place]) 04/30/2020  . Elevated troponin 04/30/2020  . Fatigue 04/29/2020  . Malnutrition of mild degree ([place]) 04/06/2019  . Diabetic foot ulcer ([place]) 04/06/2019  . Mood disorder ([place]) 04/06/2019  . Aortic atherosclerosis ([place]) 02/13/2017  . Prostate cancer ([place]) 02/13/2017  . Primary malignant neoplasm of prostate metastatic to bone ([place]) 02/13/2017  . Orthostatic hypotension 03/19/2016  . CAD S/P percutaneous coronary angioplasty 02/28/2016  . Atherosclerosis of native coronary artery of native heart with angina pectoris ([place])   . Abnormal nuclear  stress test 09/22/2015  . Thrombocytopenia ([place])   . Advanced directives, counseling/discussion 09/27/2014  . PAF (paroxysmal atrial fibrillation) ([place]) 04/12/2014  . History of CVA (cerebrovascular accident) 03/31/2014  . CKD stage 3 due to type 2 diabetes mellitus ([place])   . Uncontrolled type 2 diabetes mellitus with diabetic polyneuropathy, without long-term current use of insulin ([place]) 11/05/2011  . Routine general medical examination at a health care facility 05/07/2011  . Essential hypertension 12/23/2009  . GERD 12/23/2009  . BPH (benign prostatic  hyperplasia) 12/23/2009  . OSTEOARTHRITIS 12/23/2009    Past Surgical History:  Procedure Laterality Date  . BACK SURGERY     2001  . CARDIAC CATHETERIZATION  12/2007  . CARDIAC CATHETERIZATION N/A 09/22/2015   Procedure: Left Heart Cath and Coronary Angiography;  Surgeon: Peter M Martinique, MD;  Location: [facility];  Service: Cardiovascular;  Laterality: N/A;  . CARDIAC CATHETERIZATION N/A 10/06/2015   Procedure: Coronary Stent Intervention;  Surgeon: Peter M Martinique, MD;  Location: [facility];  Service: Cardiovascular;  Laterality: N/A;  . CARDIAC CATHETERIZATION N/A 02/29/2016   Procedure: Left Heart Cath and Coronary Angiography;  Surgeon: Jettie Booze, MD;  Location: [facility];  Service: Cardiovascular;  Laterality: N/A;  . COLONOSCOPY W/ BIOPSIES AND POLYPECTOMY    . CORONARY STENT PLACEMENT  10/06/2015   [name]  with DES  . EAR CYST EXCISION N/A 04/06/2015   Procedure: EXCISION OF SCALP CYST;  Surgeon: Donnie Mesa, MD;  Location: [facility];  Service: General;  Laterality: N/A;  . ESOPHAGOGASTRODUODENOSCOPY (EGD) WITH ESOPHAGEAL DILATION  2001   with dilation  . LUMBAR DISC SURGERY  09/26/1999   "cleaned out arthritis and bone spurs"  . SHOULDER ARTHROSCOPY W/ ROTATOR CUFF REPAIR Left 2005  . THULIUM LASER TURP (TRANSURETHRAL RESECTION OF PROSTATE) N/A 01/21/2017   Procedure: Marcelino Duster LASER TURP (TRANSURETHRAL RESECTION OF PROSTATE) CAUTERIZATION OF BLADDER LESION;  Surgeon: Franchot Gallo, MD;  Location: [facility];  Service: Urology;  Laterality: N/A;       Family History  Problem Relation Age of Onset  . Stroke Father   . Peripheral vascular disease Father        amputation  . Heart failure Mother        CHF  . Coronary artery disease Mother   . Heart attack Mother 31       Multiple  . COPD Brother   . Heart disease Brother   . Cancer Brother        Bone  . Lung cancer Sister     Social History   Tobacco Use  . Smoking status: Former Smoker     Years: 3.00    Types: Cigarettes  . Smokeless tobacco: Never Used  . Tobacco comment: " Quit smoking by age 76; was a someday smoker "  Vaping Use  . Vaping Use: Never used  Substance Use Topics  . Alcohol use: No  . Drug use: No    Home Medications Prior to Admission medications   Medication Sig Start Date End Date Taking? Authorizing Provider  acetaminophen (TYLENOL) 325 MG tablet Take 650 mg by mouth every 12 (twelve) hours as needed for mild pain or moderate pain.   Yes [provider]  ascorbic acid (VITAMIN C) 500 MG tablet Take 1,000 mg by mouth daily. 07/29/20  Yes [provider]  B Complex Vitamins (VITAMIN B COMPLEX PO) Take 1 tablet by mouth daily.   Yes [provider]  Pleas Patricia  MICROLET LANCETS lancets Use to test blood sugar once daily dx: E11.40 10/18/15  Yes [name], MD  Blood Glucose Monitoring Suppl (CONTOUR NEXT EZ MONITOR) w/Device KIT USE TO TEST BLOOD SUGAR ONCE DAILY. Dx Code E11.40 06/13/16  Yes [name], MD  calcium carbonate (CALCIUM 600) 1500 (600 Ca) MG TABS tablet Take 600 mg of elemental calcium by mouth daily.    Yes [provider]  cyanocobalamin 1000 MCG tablet Take 1 tablet (1,000 mcg total) by mouth at bedtime. 07/14/20  Yes Barb Merino, MD  ergocalciferol (VITAMIN D2) 1.25 MG (50000 UT) capsule Take 50,000 Units by mouth once a week. Saturdays   Yes [provider]  furosemide (LASIX) 40 MG tablet Take 40 mg by mouth daily as needed for fluid.    Yes [provider]  glucose blood (CONTOUR NEXT TEST) test strip Check blood sugar once daily and as directed.Dx Code E11.40 03/19/19  Yes [name], MD  lidocaine (LIDODERM) 5 % Place 1 patch onto the skin daily. Remove & Discard patch within 12 hours or as directed by MD 07/27/20  Yes Arrien, Jimmy Picket, MD  metoprolol tartrate (LOPRESSOR) 50 MG tablet Take 1 tablet (50 mg total) by mouth in the morning, at noon, and at bedtime.  07/15/20 07/15/21 Yes Barb Merino, MD  Multiple Vitamin (MULTIVITAMIN WITH MINERALS) TABS tablet Take 1 tablet by mouth daily.    Yes [provider]  nitroGLYCERIN (NITROSTAT) 0.4 MG SL tablet Place 1 tablet (0.4 mg total) under the tongue every 5 (five) minutes as needed for chest pain. Patient taking differently: Place 0.4 mg under the tongue every 5 (five) minutes as needed for chest pain. If no relief, call MD 05/15/18  Yes [name], MD  polyethylene glycol (MIRALAX / GLYCOLAX) 17 g packet Take 17 g by mouth daily as needed for mild constipation.    Yes [provider]  traMADol (ULTRAM) 50 MG tablet Take 1 tablet (50 mg total) by mouth every 6 (six) hours as needed for moderate pain. 07/22/20  Yes Arrien, Jimmy Picket, MD    Allergies    Soma [carisoprodol] and Doxazosin  Review of Systems   Review of Systems  Gastrointestinal: Positive for abdominal pain.  All other systems reviewed and are negative.   Physical Exam Updated Vital Signs BP (!) 154/66   Pulse 66   Temp (!) 97.4 F (36.3 C) (Oral)   Resp 19   Ht '5\' 11"'  (1.803 m)   Wt 59 kg   SpO2 100%   BMI 18.13 kg/m   Physical Exam Vitals and nursing note reviewed.  Constitutional:      Appearance: He is well-developed. He is ill-appearing.     Comments: thin  HENT:     Head: Normocephalic and atraumatic.     Mouth/Throat:     Mouth: Mucous membranes are dry.  Cardiovascular:     Rate and Rhythm: Normal rate and regular rhythm.     Heart sounds: No murmur heard.   Pulmonary:     Effort: Pulmonary effort is normal. No respiratory distress.     Breath sounds: Normal breath sounds.  Abdominal:     Palpations: Abdomen is soft.     Tenderness: There is abdominal tenderness. There is guarding. There is no rebound.     Comments: Moderate right sided abdominal tenderness  Musculoskeletal:        General: No swelling or tenderness.  Skin:    General: Skin  is warm and dry.      Coloration: Skin is pale.  Neurological:     Mental Status: He is alert and oriented to person, place, and time.  Psychiatric:        Behavior: Behavior normal.     ED Results / Procedures / Treatments   Labs (all labs ordered are listed, but only abnormal results are displayed) Labs Reviewed  COMPREHENSIVE METABOLIC PANEL - Abnormal; Notable for the following components:      Result Value   Potassium <2.0 (*)    Glucose, Bld 153 (*)    Calcium 7.8 (*)    Total Protein 6.2 (*)    Albumin 2.0 (*)    AST 84 (*)    Alkaline Phosphatase 693 (*)    All other components within normal limits  CBC WITH DIFFERENTIAL/PLATELET - Abnormal; Notable for the following components:   RBC 2.92 (*)    Hemoglobin 7.4 (*)    HCT 24.4 (*)    MCH 25.3 (*)    RDW 20.9 (*)    Platelets 78 (*)    nRBC 2.2 (*)    Abs Immature Granulocytes 0.73 (*)    All other components within normal limits  MAGNESIUM - Abnormal; Notable for the following components:   Magnesium 3.1 (*)    All other components within normal limits  RESP PANEL BY RT-PCR (FLU A&B, COVID) ARPGX2  CULTURE, BLOOD (SINGLE)  LACTIC ACID, PLASMA  PHOSPHORUS  URINALYSIS, ROUTINE W REFLEX MICROSCOPIC  CBC  BASIC METABOLIC PANEL  TYPE AND SCREEN    EKG EKG Interpretation  Date/Time:  Wednesday August 17 2020 16:26:21 EST Ventricular Rate:  63 PR Interval:    QRS Duration: 91 QT Interval:  606 QTC Calculation: 621 R Axis:   -34 Text Interpretation: Sinus rhythm Paired ventricular premature complexes Abnormal R-wave progression, early transition Inferior infarct, old Abnormal lateral Q waves Prolonged QT interval Confirmed by Quintella Reichert [phone]) on 08/17/2020 6:49:50 PM   Radiology CT Abdomen Pelvis W Contrast  Result Date: 08/17/2020 CLINICAL DATA:  Cholelithiasis, right lower quadrant pain radiating to right inguinal region, nausea/vomiting/diarrhea EXAM: CT ABDOMEN AND PELVIS WITH CONTRAST TECHNIQUE: Multidetector CT  imaging of the abdomen and pelvis was performed using the standard protocol following bolus administration of intravenous contrast. CONTRAST:  128m OMNIPAQUE IOHEXOL 300 MG/ML  SOLN COMPARISON:  07/05/2020 FINDINGS: Lower chest: There are small bilateral pleural effusions. Trace dependent lower lobe atelectasis. Small hiatal hernia. Hepatobiliary: Cholelithiasis identified without cholecystitis. Liver is unremarkable. Pancreas: There is diffuse pancreatic atrophy, stable. Spleen: Normal in size without focal abnormality. Adrenals/Urinary Tract: There is bilateral renal cortical atrophy and scarring, stable. No renal calculi or obstructive uropathy. Bladder is moderately distended. High attenuation sediment layers dependently within the right lateral aspect of the bladder. No bladder wall thickening. The adrenals are unremarkable. Stomach/Bowel: There is no bowel obstruction or ileus. There is a large amount of stool throughout the colon, most pronounced within the rectal vault. Distal colonic wall thickening at the rectosigmoid colon consistent with an element of stercoral colitis. There is a normal appendix in the right lower quadrant. Vascular/Lymphatic: Aortic atherosclerosis. No enlarged abdominal or pelvic lymph nodes. Reproductive: Prostate is unremarkable. Other: No free fluid or free gas. No abdominal wall hernia. Musculoskeletal: Multifocal sclerotic lesions are seen throughout the thoracic cage, thoracolumbar spine, bony pelvis. No acute displaced fracture. Reconstructed images demonstrate no additional findings. IMPRESSION: 1. Large amount of stool throughout the colon, most pronounced within the rectal vault. Distal  colonic wall thickening at the rectosigmoid colon consistent with an element of stercoral colitis. 2. Cholelithiasis without cholecystitis. 3. Small bilateral pleural effusions. 4. Stable diffuse bony metastases. 5. Aortic Atherosclerosis (ICD10-I70.0). Electronically Signed   By: Randa Ngo M.D.   On: 08/17/2020 19:43   DG Chest Port 1 View  Result Date: 08/17/2020 CLINICAL DATA:  Pt diagnosed with gallstones on Friday. Increased RLQ pain radiating to groin with N/V/D. EXAM: PORTABLE CHEST 1 VIEW COMPARISON:  07/17/2020 FINDINGS: Cardiac silhouette is normal in size. No mediastinal or hilar masses. Clear lungs.  No convincing pleural effusion and no pneumothorax. Skeletal structures are grossly intact. IMPRESSION: No active disease. Electronically Signed   By: Lajean Manes M.D.   On: 08/17/2020 17:30    Procedures Procedures (including critical care time) CRITICAL CARE Performed by: Quintella Reichert   Total critical care time: 40 minutes  Critical care time was exclusive of separately billable procedures and treating other patients.  Critical care was necessary to treat or prevent imminent or life-threatening deterioration.  Critical care was time spent personally by me on the following activities: development of treatment plan with patient and/or surrogate as well as nursing, discussions with consultants, evaluation of patient's response to treatment, examination of patient, obtaining history from patient or surrogate, ordering and performing treatments and interventions, ordering and review of laboratory studies, ordering and review of radiographic studies, pulse oximetry and re-evaluation of patient's condition.  Medications Ordered in ED Medications  potassium chloride 10 mEq in 100 mL IVPB (10 mEq Intravenous New Bag/Given (Non-Interop) 08/17/20 2235)  lactated ringers infusion ( Intravenous New Bag/Given 08/17/20 2131)  acetaminophen (TYLENOL) tablet 650 mg (has no administration in time range)    Or  acetaminophen (TYLENOL) suppository 650 mg (has no administration in time range)  fentaNYL (SUBLIMAZE) injection 12.5-50 mcg (12.5 mcg Intravenous Given 08/17/20 2243)  sodium chloride 0.9 % bolus 500 mL (0 mLs Intravenous Stopped 08/17/20 1930)  fentaNYL (SUBLIMAZE)  injection 50 mcg (50 mcg Intravenous Given 08/17/20 1752)  iohexol (OMNIPAQUE) 300 MG/ML solution 100 mL (100 mLs Intravenous Contrast Given 08/17/20 1921)  magnesium sulfate IVPB 2 g 50 mL (0 g Intravenous Stopped 08/17/20 2138)  potassium chloride SA (KLOR-CON) CR tablet 40 mEq (40 mEq Oral Given 08/17/20 2131)    ED Course  I have reviewed the triage vital signs and the nursing notes.  Pertinent labs & imaging results that were available during my care of the patient were reviewed by me and considered in my medical decision making (see chart for details).    MDM Rules/Calculators/A&P                         Patient here for evaluation of progressive abdominal pain, poor appetite. Labs significant for profound hypokalemia, EKG with prolonged QT. He was treated with magnesium, potassium replacement. A CT abdomen pelvis obtained, which demonstrates large amount of stool. He is having liquid stool out in the emergency department. Given his ongoing pain and electrolyte derangements with EKG changes recommend admission for ongoing treatment. Patient and daughter are in agreement with plan. Hospitalist consulted for admission.  Final Clinical Impression(s) / ED Diagnoses Final diagnoses:  Hypokalemia  Right lower quadrant abdominal pain  Constipation, unspecified constipation type    Rx / DC Orders ED Discharge Orders    None       Quintella Reichert, MD 08/17/20 2334

## 2020-08-17 NOTE — ED Triage Notes (Signed)
Pt diagnosed with gallstones on Friday. Increased RLQ pain radiating to groin with N/V/D. 247ml NS and 4mg  Zofran administered with EMS.

## 2020-08-17 NOTE — H&P (Signed)
History and Physical    Adrian Singh FYB:[id number] DOB: 1933-12-07 DOA: 08/17/2020  PCP: [name], MD  Patient coming from: SNF  I have personally briefly reviewed patient's old medical records in [place]  Chief Complaint: Abd pain  HPI: Adrian Singh is a 86 y.o. male with medical history significant of metastatic prostate CA to bone, adult failure to thrive over past months now on hospice, HTN, PAF, dCHF.  Pt admitted in October for Ileus of colon felt to be due to profound hypokalemia.  K was replaced, had rectal tube placed which didn't drain much.  Ultimately this resolved a couple of days later with normalization of K.  See also GI consult notes from that admission by Dr. Loletha Carrow.  Pt presents to ED today with c/o abd pain.  Associated nausea, "diarrhea".  Abd pain in RUQ and RLQ.  Abd pain has been progressively worsening over the past week.  Tm 100.9 on Sat.  Symptoms are severe, constant, and worsening.  Recent imaging showed gallstones in gallbladder.   ED Course: CT abd pelvis: 1) large amount of stool in colon, especially rectum, ? Stercoral colitis 2) Cholelithiasis without cholecystitis  AST 84, ALT 11, Tbili 0.7, ALK 693 (ALK elevation chronic).  HGB 7.4, platelets 78.   Review of Systems: As per HPI, otherwise all review of systems negative.  Past Medical History:  Diagnosis Date  . Arthritis    "legs, back" (10/06/2015)  . Atrial fibrillation ([place])    x1  . Atrial fibrillation ([place])   . CAD (coronary artery disease)    nonobstructive  . Cancer ([place])    skin cancer on ear (froze it off) and back (cut it off)  . Dysrhythmia   . Esophageal reflux    hx of  . History of kidney stones   . Hx of heart artery stent   . Hypertrophy of prostate without urinary obstruction and other lower urinary tract symptoms (LUTS)   . Osteoarthrosis, unspecified whether generalized or localized, unspecified site   . Prostate cancer ([place])   . Skin  cancer   . Stroke [facility]) 03/2014   "had a series of mini strokes; maybe 4"; denies residual on 10/06/2015  . Thrombocytopenia ([place])   . Type II diabetes mellitus ([place])    type 2  . Unspecified essential hypertension     Past Surgical History:  Procedure Laterality Date  . BACK SURGERY     2001  . CARDIAC CATHETERIZATION  12/2007  . CARDIAC CATHETERIZATION N/A 09/22/2015   Procedure: Left Heart Cath and Coronary Angiography;  Surgeon: Peter M Martinique, MD;  Location: [place] [facility];  Service: Cardiovascular;  Laterality: N/A;  . CARDIAC CATHETERIZATION N/A 10/06/2015   Procedure: Coronary Stent Intervention;  Surgeon: Peter M Martinique, MD;  Location: [place] [facility];  Service: Cardiovascular;  Laterality: N/A;  . CARDIAC CATHETERIZATION N/A 02/29/2016   Procedure: Left Heart Cath and Coronary Angiography;  Surgeon: Jettie Booze, MD;  Location: [place] [facility];  Service: Cardiovascular;  Laterality: N/A;  . COLONOSCOPY W/ BIOPSIES AND POLYPECTOMY    . CORONARY STENT PLACEMENT  10/06/2015   [name]  with DES  . EAR CYST EXCISION N/A 04/06/2015   Procedure: EXCISION OF SCALP CYST;  Surgeon: Donnie Mesa, MD;  Location: [place];  Service: General;  Laterality: N/A;  . ESOPHAGOGASTRODUODENOSCOPY (EGD) WITH ESOPHAGEAL DILATION  2001   with dilation  . LUMBAR DISC SURGERY  09/26/1999   "cleaned out  arthritis and bone spurs"  . SHOULDER ARTHROSCOPY W/ ROTATOR CUFF REPAIR Left 2005  . THULIUM LASER TURP (TRANSURETHRAL RESECTION OF PROSTATE) N/A 01/21/2017   Procedure: Marcelino Duster LASER TURP (TRANSURETHRAL RESECTION OF PROSTATE) CAUTERIZATION OF BLADDER LESION;  Surgeon: Franchot Gallo, MD;  Location: [facility];  Service: Urology;  Laterality: N/A;     reports that he has quit smoking. His smoking use included cigarettes. He quit after 3.00 years of use. He has never used smokeless tobacco. He reports that he does not drink alcohol and does not use drugs.  Allergies  Allergen Reactions  . Soma  [Carisoprodol] Other (See Comments)    "did a number on me"  . Doxazosin Other (See Comments)    dizziness    Family History  Problem Relation Age of Onset  . Stroke Father   . Peripheral vascular disease Father        amputation  . Heart failure Mother        CHF  . Coronary artery disease Mother   . Heart attack Mother 98       Multiple  . COPD Brother   . Heart disease Brother   . Cancer Brother        Bone  . Lung cancer Sister      Prior to Admission medications   Medication Sig Start Date End Date Taking? Authorizing Provider  acetaminophen (TYLENOL) 325 MG tablet Take 650 mg by mouth every 12 (twelve) hours as needed for mild pain or moderate pain.   Yes [provider]  ascorbic acid (VITAMIN C) 500 MG tablet Take 1,000 mg by mouth daily. 07/29/20  Yes [provider]  B Complex Vitamins (VITAMIN B COMPLEX PO) Take 1 tablet by mouth daily.   Yes [provider]  BAYER MICROLET LANCETS lancets Use to test blood sugar once daily dx: E11.40 10/18/15  Yes [name], MD  Blood Glucose Monitoring Suppl (CONTOUR NEXT EZ MONITOR) w/Device KIT USE TO TEST BLOOD SUGAR ONCE DAILY. Dx Code E11.40 06/13/16  Yes [name], MD  calcium carbonate (CALCIUM 600) 1500 (600 Ca) MG TABS tablet Take 600 mg of elemental calcium by mouth daily.    Yes [provider]  cyanocobalamin 1000 MCG tablet Take 1 tablet (1,000 mcg total) by mouth at bedtime. 07/14/20  Yes Barb Merino, MD  ergocalciferol (VITAMIN D2) 1.25 MG (50000 UT) capsule Take 50,000 Units by mouth once a week. Saturdays   Yes [provider]  furosemide (LASIX) 40 MG tablet Take 40 mg by mouth daily as needed for fluid.    Yes [provider]  glucose blood (CONTOUR NEXT TEST) test strip Check blood sugar once daily and as directed.Dx Code E11.40 03/19/19  Yes [name], MD  lidocaine (LIDODERM) 5 % Place 1 patch onto the skin daily. Remove & Discard patch  within 12 hours or as directed by MD 07/27/20  Yes Arrien, Jimmy Picket, MD  metoprolol tartrate (LOPRESSOR) 50 MG tablet Take 1 tablet (50 mg total) by mouth in the morning, at noon, and at bedtime. 07/15/20 07/15/21 Yes Barb Merino, MD  Multiple Vitamin (MULTIVITAMIN WITH MINERALS) TABS tablet Take 1 tablet by mouth daily.    Yes [provider]  nitroGLYCERIN (NITROSTAT) 0.4 MG SL tablet Place 1 tablet (0.4 mg total) under the tongue every 5 (five) minutes as needed for chest pain. Patient taking differently: Place 0.4 mg under the tongue every 5 (five) minutes as needed for chest pain.  If no relief, call MD 05/15/18  Yes [name], MD  polyethylene glycol (MIRALAX / GLYCOLAX) 17 g packet Take 17 g by mouth daily as needed for mild constipation.    Yes [provider]  traMADol (ULTRAM) 50 MG tablet Take 1 tablet (50 mg total) by mouth every 6 (six) hours as needed for moderate pain. 07/22/20  Yes Arrien, Jimmy Picket, MD    Physical Exam: Vitals:   08/17/20 1800 08/17/20 1900 08/17/20 2030 08/17/20 2100  BP: (!) 147/67 (!) 152/63 (!) 147/72 (!) 141/67  Pulse: (!) 59 (!) 59 75 60  Resp: _0 Temp:      TempSrc:      SpO2: 100% 100% 100% 100%  Weight:      Height:        Constitutional: NAD, calm, comfortable Eyes: PERRL, lids and conjunctivae normal ENMT: Mucous membranes are dry. Posterior pharynx clear of any exudate or lesions.Normal dentition.  Neck: normal, supple, no masses, no thyromegaly Respiratory: clear to auscultation bilaterally, no wheezing, no crackles. Normal respiratory effort. No accessory muscle use.  Cardiovascular: Regular rate and rhythm, no murmurs / rubs / gallops. No extremity edema. 2+ pedal pulses. No carotid bruits.  Abdomen: Moderate R sided TTP with guarding. Musculoskeletal: no clubbing / cyanosis. No joint deformity upper and lower extremities. Good ROM, no contractures. Normal muscle tone.  Skin: no rashes,  lesions, ulcers. No induration Neurologic: CN 2-12 grossly intact. Sensation intact, DTR normal. Strength 5/5 in all 4.  Psychiatric: Normal judgment and insight. Alert and oriented x 3. Normal mood.    Labs on Admission: I have personally reviewed following labs and imaging studies  CBC: Recent Labs  Lab 08/17/20 1744  WBC 6.8  NEUTROABS 4.4  HGB 7.4*  HCT 24.4*  MCV 83.6  PLT 78*   Basic Metabolic Panel: Recent Labs  Lab 08/17/20 1744  NA 142  K <2.0*  CL 100  CO2 29  GLUCOSE 153*  BUN 15  CREATININE 0.62  CALCIUM 7.8*   GFR: Estimated Creatinine Clearance: 57.4 mL/min (by C-G formula based on SCr of 0.62 mg/dL). Liver Function Tests: Recent Labs  Lab 08/17/20 1744  AST 84*  ALT 11  ALKPHOS 693*  BILITOT 0.7  PROT 6.2*  ALBUMIN 2.0*   No results for input(s): LIPASE, AMYLASE in the last 168 hours. No results for input(s): AMMONIA in the last 168 hours. Coagulation Profile: No results for input(s): INR, PROTIME in the last 168 hours. Cardiac Enzymes: No results for input(s): CKTOTAL, CKMB, CKMBINDEX, TROPONINI in the last 168 hours. BNP (last 3 results) No results for input(s): PROBNP in the last 8760 hours. HbA1C: No results for input(s): HGBA1C in the last 72 hours. CBG: No results for input(s): GLUCAP in the last 168 hours. Lipid Profile: No results for input(s): CHOL, HDL, LDLCALC, TRIG, CHOLHDL, LDLDIRECT in the last 72 hours. Thyroid Function Tests: No results for input(s): TSH, T4TOTAL, FREET4, T3FREE, THYROIDAB in the last 72 hours. Anemia Panel: No results for input(s): VITAMINB12, FOLATE, FERRITIN, TIBC, IRON, RETICCTPCT in the last 72 hours. Urine analysis:    Component Value Date/Time   COLORURINE YELLOW 07/17/2020 [place] 07/17/2020 1355   LABSPEC 1.015 07/17/2020 [place] 6.0 07/17/2020 1355   GLUCOSEU NEGATIVE 07/17/2020 1355   [place] 07/17/2020 [place] 07/17/2020 1355    BILIRUBINUR negative 03/18/2014 1014   KETONESUR 5 (A) 07/17/2020 1355   PROTEINUR 30 (  A) 07/17/2020 1355   UROBILINOGEN 1.0 03/31/2014 1228   NITRITE NEGATIVE 07/17/2020 1355   LEUKOCYTESUR NEGATIVE 07/17/2020 1355    Radiological Exams on Admission: CT Abdomen Pelvis W Contrast  Result Date: 08/17/2020 CLINICAL DATA:  Cholelithiasis, right lower quadrant pain radiating to right inguinal region, nausea/vomiting/diarrhea EXAM: CT ABDOMEN AND PELVIS WITH CONTRAST TECHNIQUE: Multidetector CT imaging of the abdomen and pelvis was performed using the standard protocol following bolus administration of intravenous contrast. CONTRAST:  140m OMNIPAQUE IOHEXOL 300 MG/ML  SOLN COMPARISON:  07/05/2020 FINDINGS: Lower chest: There are small bilateral pleural effusions. Trace dependent lower lobe atelectasis. Small hiatal hernia. Hepatobiliary: Cholelithiasis identified without cholecystitis. Liver is unremarkable. Pancreas: There is diffuse pancreatic atrophy, stable. Spleen: Normal in size without focal abnormality. Adrenals/Urinary Tract: There is bilateral renal cortical atrophy and scarring, stable. No renal calculi or obstructive uropathy. Bladder is moderately distended. High attenuation sediment layers dependently within the right lateral aspect of the bladder. No bladder wall thickening. The adrenals are unremarkable. Stomach/Bowel: There is no bowel obstruction or ileus. There is a large amount of stool throughout the colon, most pronounced within the rectal vault. Distal colonic wall thickening at the rectosigmoid colon consistent with an element of stercoral colitis. There is a normal appendix in the right lower quadrant. Vascular/Lymphatic: Aortic atherosclerosis. No enlarged abdominal or pelvic lymph nodes. Reproductive: Prostate is unremarkable. Other: No free fluid or free gas. No abdominal wall hernia. Musculoskeletal: Multifocal sclerotic lesions are seen throughout the thoracic cage, thoracolumbar  spine, bony pelvis. No acute displaced fracture. Reconstructed images demonstrate no additional findings. IMPRESSION: 1. Large amount of stool throughout the colon, most pronounced within the rectal vault. Distal colonic wall thickening at the rectosigmoid colon consistent with an element of stercoral colitis. 2. Cholelithiasis without cholecystitis. 3. Small bilateral pleural effusions. 4. Stable diffuse bony metastases. 5. Aortic Atherosclerosis (ICD10-I70.0). Electronically Signed   By: MRanda NgoM.D.   On: 08/17/2020 19:43   DG Chest Port 1 View  Result Date: 08/17/2020 CLINICAL DATA:  Pt diagnosed with gallstones on Friday. Increased RLQ pain radiating to groin with N/V/D. EXAM: PORTABLE CHEST 1 VIEW COMPARISON:  07/17/2020 FINDINGS: Cardiac silhouette is normal in size. No mediastinal or hilar masses. Clear lungs.  No convincing pleural effusion and no pneumothorax. Skeletal structures are grossly intact. IMPRESSION: No active disease. Electronically Signed   By: DLajean ManesM.D.   On: 08/17/2020 17:30    EKG: Independently reviewed.  Assessment/Plan Principal Problem:   Ileus (HCC) Active Problems:   PAF (paroxysmal atrial fibrillation) (HCC)   Thrombocytopenia (HCC)   Anemia   Severe protein-calorie malnutrition (HCC)   Hypokalemia   Failure to thrive in adult   Prolonged QT interval    1. Ileus - 1. Although not being called such by radiologist this time around, suspect he has colon ileus again with small amount of overflow incontinence of liquid stool.  This likely is once again due to Hypokalemia (which is even more severe this time than it was in Oct when it was causing ileus last time). 2. Replace K, empiric Mg (Mg pending but QT prolongation present). 3. GI consult in AM 4. Holding off on laxatives for the moment 5. Will keep him NPO except for meds until GI can give an opinion in the AM as to whats going on. 2. Hypokalemia - 1. 6 runs IV K, 439m PO K, repeat BMP in  AM 3. QT prolongation - 1. Tele monitor 2. Empiric Mg, Mg level pending  3. Replace K 4. PAF - currently sinus 5. Severe malnutrition - 1. Check PO4 6. Anemia - 1. Likely anemia of chronic dz 2. HGB down to 7.4 today 3. Repeat CBC in AM 4. Type and screen, transfuse if needed 5. No evidence of acute bleed 7. Thrombocytopenia - 1. Again, chronic  DVT prophylaxis: SCDs Code Status: DNR - discussed with pt Family Communication: Family at bedside Disposition Plan: SNF after K replacement and Ileus resolved Consults called: Message Sent to Dr. Ardis Hughs for GI consult in AM Admission status: Place in obs   Adrian Singh, [facility]  How to contact the [facility] Attending or Consulting provider [place] or covering provider during after hours [place], for this patient?  1. Check the care team in [facility] and look for a) attending/consulting [facility] provider listed and b) the [facility] team listed 2. Log into [email or web address]  Amion Physician Scheduling and messaging for groups and whole hospitals  On call and physician scheduling software for group practices, residents, hospitalists and other medical providers for call, clinic, rotation and shift schedules. On[employer] is a hospital-wide system for scheduling doctors and paging doctors on call. EasyPlot is for scientific plotting and data analysis.  [email or web address]  and use [place]'s universal password to access. If you do not have the password, please contact the hospital operator.  3. Locate the [facility] provider you are looking for under [facility] and page to a number that you can be directly reached. 4. If you still have difficulty reaching the provider, please page the [facility] (Director on Call) for the Hospitalists listed on amion for assistance.  08/17/2020, 9:37 PM

## 2020-08-18 DIAGNOSIS — R109 Unspecified abdominal pain: Secondary | ICD-10-CM | POA: Diagnosis present

## 2020-08-18 DIAGNOSIS — C7982 Secondary malignant neoplasm of genital organs: Secondary | ICD-10-CM

## 2020-08-18 DIAGNOSIS — K567 Ileus, unspecified: Secondary | ICD-10-CM | POA: Diagnosis not present

## 2020-08-18 DIAGNOSIS — Z7189 Other specified counseling: Secondary | ICD-10-CM

## 2020-08-18 DIAGNOSIS — E876 Hypokalemia: Secondary | ICD-10-CM | POA: Diagnosis present

## 2020-08-18 DIAGNOSIS — E869 Volume depletion, unspecified: Secondary | ICD-10-CM | POA: Diagnosis present

## 2020-08-18 DIAGNOSIS — G893 Neoplasm related pain (acute) (chronic): Secondary | ICD-10-CM | POA: Diagnosis present

## 2020-08-18 DIAGNOSIS — K8020 Calculus of gallbladder without cholecystitis without obstruction: Secondary | ICD-10-CM | POA: Diagnosis present

## 2020-08-18 DIAGNOSIS — I5032 Chronic diastolic (congestive) heart failure: Secondary | ICD-10-CM | POA: Diagnosis present

## 2020-08-18 DIAGNOSIS — D696 Thrombocytopenia, unspecified: Secondary | ICD-10-CM | POA: Diagnosis present

## 2020-08-18 DIAGNOSIS — R64 Cachexia: Secondary | ICD-10-CM | POA: Diagnosis present

## 2020-08-18 DIAGNOSIS — Z681 Body mass index (BMI) 19 or less, adult: Secondary | ICD-10-CM | POA: Diagnosis not present

## 2020-08-18 DIAGNOSIS — R1031 Right lower quadrant pain: Secondary | ICD-10-CM

## 2020-08-18 DIAGNOSIS — I480 Paroxysmal atrial fibrillation: Secondary | ICD-10-CM | POA: Diagnosis present

## 2020-08-18 DIAGNOSIS — K5289 Other specified noninfective gastroenteritis and colitis: Secondary | ICD-10-CM | POA: Diagnosis present

## 2020-08-18 DIAGNOSIS — R627 Adult failure to thrive: Secondary | ICD-10-CM | POA: Diagnosis present

## 2020-08-18 DIAGNOSIS — Z20822 Contact with and (suspected) exposure to covid-19: Secondary | ICD-10-CM | POA: Diagnosis present

## 2020-08-18 DIAGNOSIS — I2510 Atherosclerotic heart disease of native coronary artery without angina pectoris: Secondary | ICD-10-CM | POA: Diagnosis present

## 2020-08-18 DIAGNOSIS — Z66 Do not resuscitate: Secondary | ICD-10-CM | POA: Diagnosis present

## 2020-08-18 DIAGNOSIS — C61 Malignant neoplasm of prostate: Secondary | ICD-10-CM | POA: Diagnosis not present

## 2020-08-18 DIAGNOSIS — E43 Unspecified severe protein-calorie malnutrition: Secondary | ICD-10-CM | POA: Diagnosis present

## 2020-08-18 DIAGNOSIS — C7951 Secondary malignant neoplasm of bone: Secondary | ICD-10-CM | POA: Diagnosis present

## 2020-08-18 DIAGNOSIS — K5981 Ogilvie syndrome: Secondary | ICD-10-CM | POA: Diagnosis present

## 2020-08-18 DIAGNOSIS — E119 Type 2 diabetes mellitus without complications: Secondary | ICD-10-CM | POA: Diagnosis present

## 2020-08-18 DIAGNOSIS — Z515 Encounter for palliative care: Secondary | ICD-10-CM

## 2020-08-18 DIAGNOSIS — D649 Anemia, unspecified: Secondary | ICD-10-CM | POA: Diagnosis not present

## 2020-08-18 DIAGNOSIS — D630 Anemia in neoplastic disease: Secondary | ICD-10-CM | POA: Diagnosis present

## 2020-08-18 DIAGNOSIS — Z801 Family history of malignant neoplasm of trachea, bronchus and lung: Secondary | ICD-10-CM | POA: Diagnosis not present

## 2020-08-18 DIAGNOSIS — R54 Age-related physical debility: Secondary | ICD-10-CM | POA: Diagnosis present

## 2020-08-18 DIAGNOSIS — I110 Hypertensive heart disease with heart failure: Secondary | ICD-10-CM | POA: Diagnosis present

## 2020-08-18 DIAGNOSIS — I4581 Long QT syndrome: Secondary | ICD-10-CM | POA: Diagnosis present

## 2020-08-18 LAB — CBC
HCT: 23.6 % — ABNORMAL LOW (ref 39.0–52.0)
Hemoglobin: 7.1 g/dL — ABNORMAL LOW (ref 13.0–17.0)
MCH: 25.2 pg — ABNORMAL LOW (ref 26.0–34.0)
MCHC: 30.1 g/dL (ref 30.0–36.0)
MCV: 83.7 fL (ref 80.0–100.0)
Platelets: 65 10*3/uL — ABNORMAL LOW (ref 150–400)
RBC: 2.82 MIL/uL — ABNORMAL LOW (ref 4.22–5.81)
RDW: 21 % — ABNORMAL HIGH (ref 11.5–15.5)
WBC: 7.3 10*3/uL (ref 4.0–10.5)
nRBC: 1.8 % — ABNORMAL HIGH (ref 0.0–0.2)

## 2020-08-18 LAB — URINALYSIS, ROUTINE W REFLEX MICROSCOPIC
Bilirubin Urine: NEGATIVE
Glucose, UA: NEGATIVE mg/dL
Hgb urine dipstick: NEGATIVE
Ketones, ur: 5 mg/dL — AB
Leukocytes,Ua: NEGATIVE
Nitrite: NEGATIVE
Protein, ur: NEGATIVE mg/dL
Specific Gravity, Urine: 1.034 — ABNORMAL HIGH (ref 1.005–1.030)
pH: 7 (ref 5.0–8.0)

## 2020-08-18 LAB — BASIC METABOLIC PANEL
Anion gap: 13 (ref 5–15)
BUN: 13 mg/dL (ref 8–23)
CO2: 26 mmol/L (ref 22–32)
Calcium: 7.6 mg/dL — ABNORMAL LOW (ref 8.9–10.3)
Chloride: 102 mmol/L (ref 98–111)
Creatinine, Ser: 0.66 mg/dL (ref 0.61–1.24)
GFR, Estimated: >60 mL/min (ref >60)
Glucose, Bld: 139 mg/dL — ABNORMAL HIGH (ref 70–99)
Potassium: 2.8 mmol/L — ABNORMAL LOW (ref 3.5–5.1)
Sodium: 141 mmol/L (ref 135–145)

## 2020-08-18 LAB — MAGNESIUM: Magnesium: 2.4 mg/dL (ref 1.7–2.4)

## 2020-08-18 MED ORDER — POTASSIUM CHLORIDE IN NACL 40-0.9 MEQ/L-% IV SOLN
INTRAVENOUS | Status: DC
  Filled 2020-08-18 (×3): qty 1000

## 2020-08-18 MED ORDER — LIDOCAINE 5 % EX PTCH
1.0000 | MEDICATED_PATCH | CUTANEOUS | Status: DC
  Administered 2020-08-18 – 2020-08-19 (×2): 1 via TRANSDERMAL
  Filled 2020-08-18 (×4): qty 1

## 2020-08-18 MED ORDER — OXYCODONE HCL 5 MG PO TABS
5.0000 mg | ORAL_TABLET | ORAL | Status: DC | PRN
  Administered 2020-08-18 – 2020-08-19 (×2): 5 mg via ORAL
  Filled 2020-08-18 (×2): qty 1

## 2020-08-18 MED ORDER — HYDROMORPHONE HCL 1 MG/ML IJ SOLN
0.5000 mg | INTRAMUSCULAR | Status: DC | PRN
  Administered 2020-08-18 – 2020-08-20 (×5): 1 mg via INTRAVENOUS
  Filled 2020-08-18 (×5): qty 1

## 2020-08-18 MED ORDER — POTASSIUM CHLORIDE CRYS ER 20 MEQ PO TBCR
40.0000 meq | EXTENDED_RELEASE_TABLET | ORAL | Status: AC
  Administered 2020-08-18 (×2): 40 meq via ORAL
  Filled 2020-08-18 (×2): qty 2

## 2020-08-18 MED ORDER — DICLOFENAC SODIUM 1 % EX GEL
2.0000 g | Freq: Four times a day (QID) | CUTANEOUS | Status: DC
  Administered 2020-08-18 – 2020-08-20 (×7): 2 g via TOPICAL
  Filled 2020-08-18: qty 100

## 2020-08-18 NOTE — ED Notes (Signed)
Daughter at bedside, requesting to speak with hospitalist.  Admitting provider messaged. Awaiting response

## 2020-08-18 NOTE — ED Notes (Signed)
Pt given meal tray.

## 2020-08-18 NOTE — Consult Note (Addendum)
[facility] Consult Note   History RODOLPHE EDMONSTON MRN # [id number]  Date of Admission: 08/17/2020 Date of Consultation: 08/18/2020 Referring physician: Dr. Aline August, MD Primary Care Provider: [name], MD Primary Gastroenterologist: Dr. Oretha Caprice   Reason for Consultation/Chief Complaint: Abdominal pain  Subjective  HPI:  This is an 86 year old man known to me from hospitalization in October.  He has metastatic prostate cancer for which he is on hospice, as well as additional multiple medical problems.  In October he had rapid atrial fibrillation and also developed acute on chronic colonic ileus related to his debility.  At that time, rectal exam allowed some decompression of the ileus, he also needed aggressive potassium replacement and he gradually improved. He was sent to the ED overnight by hospice for progressive abdominal and musculoskeletal pain as well as reported diarrhea and intermittent vomiting for the last week.  CT scan suggested a large amount of retained colonic stool, no ileus described. No family present at the bedside during my evaluation.  Patient can provide little history given his generalized weakness.  He is having some abdominal pain difficult to characterize and type and location.  It seems more centered on the right side.  He does not know if he has been having any diarrhea.  Potassium is noted to be under 2 last evening, somewhat better today after replacement.  He is being admitted for observation, and we were consulted for his abdominal pain and CT scan findings.  ROS:  Constitutional: Generalized weakness and weight loss  Musculoskeletal:   Diffuse bony pain  All other systems are negative except as noted above in the HPI, as near as can be determined.  Limited historian due to his condition.  Past Medical History Past Medical History:  Diagnosis Date  . Arthritis    "legs, back" (10/06/2015)  . Atrial fibrillation ([place])     x1  . Atrial fibrillation ([place])   . CAD (coronary artery disease)    nonobstructive  . Cancer ([place])    skin cancer on ear (froze it off) and back (cut it off)  . Dysrhythmia   . Esophageal reflux    hx of  . History of kidney stones   . Hx of heart artery stent   . Hypertrophy of prostate without urinary obstruction and other lower urinary tract symptoms (LUTS)   . Osteoarthrosis, unspecified whether generalized or localized, unspecified site   . Prostate cancer ([place])   . Skin cancer   . Stroke [facility]) 03/2014   "had a series of mini strokes; maybe 4"; denies residual on 10/06/2015  . Thrombocytopenia ([place])   . Type II diabetes mellitus ([place])    type 2  . Unspecified essential hypertension     Past Surgical History Past Surgical History:  Procedure Laterality Date  . BACK SURGERY     2001  . CARDIAC CATHETERIZATION  12/2007  . CARDIAC CATHETERIZATION N/A 09/22/2015   Procedure: Left Heart Cath and Coronary Angiography;  Surgeon: Peter M Martinique, MD;  Location: [facility];  Service: Cardiovascular;  Laterality: N/A;  . CARDIAC CATHETERIZATION N/A 10/06/2015   Procedure: Coronary Stent Intervention;  Surgeon: Peter M Martinique, MD;  Location: [facility];  Service: Cardiovascular;  Laterality: N/A;  . CARDIAC CATHETERIZATION N/A 02/29/2016   Procedure: Left Heart Cath and Coronary Angiography;  Surgeon: Jettie Booze, MD;  Location: [place] [facility];  Service: Cardiovascular;  Laterality: N/A;  . COLONOSCOPY W/ BIOPSIES AND POLYPECTOMY    .  CORONARY STENT PLACEMENT  10/06/2015   [name]  with DES  . EAR CYST EXCISION N/A 04/06/2015   Procedure: EXCISION OF SCALP CYST;  Surgeon: Donnie Mesa, MD;  Location: [facility];  Service: General;  Laterality: N/A;  . ESOPHAGOGASTRODUODENOSCOPY (EGD) WITH ESOPHAGEAL DILATION  2001   with dilation  . LUMBAR DISC SURGERY  09/26/1999   "cleaned out arthritis and bone spurs"  . SHOULDER ARTHROSCOPY W/ ROTATOR CUFF REPAIR Left 2005  .  THULIUM LASER TURP (TRANSURETHRAL RESECTION OF PROSTATE) N/A 01/21/2017   Procedure: Marcelino Duster LASER TURP (TRANSURETHRAL RESECTION OF PROSTATE) CAUTERIZATION OF BLADDER LESION;  Surgeon: Franchot Gallo, MD;  Location: [facility];  Service: Urology;  Laterality: N/A;    Family History Family History  Problem Relation Age of Onset  . Stroke Father   . Peripheral vascular disease Father        amputation  . Heart failure Mother        CHF  . Coronary artery disease Mother   . Heart attack Mother 20       Multiple  . COPD Brother   . Heart disease Brother   . Cancer Brother        Bone  . Lung cancer Sister     Social History Social History   Socioeconomic History  . Marital status: Married    Spouse name: Not on file  . Number of children: 4  . Years of education: Not on file  . Highest education level: Not on file  Occupational History  . Occupation: Radiographer, 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[employer]    Employer: RETIRED  Tobacco Use  . Smoking status: Former Smoker    Years: 3.00    Types: Cigarettes  . Smokeless tobacco: Never Used  . Tobacco comment: " Quit smoking by age 74; was a someday smoker "  Vaping Use  . Vaping Use: Never used  Substance and Sexual Activity  . Alcohol use: No  . Drug use: No  . Sexual activity: Never  Other Topics Concern  . Not on file  Social History Narrative   No living will   Plans wife and then children to make health care decisions for him if unable   Would request at least attempts at resuscitation but no prolonged life support   Doesn't think he would want tube feeds if cognitively unaware   Social Determinants of Health   Financial Resource Strain:   . Difficulty of Paying Living Expenses: Not on file  Food Insecurity:   . Worried About Charity fundraiser in the Last Year: Not on file  . Ran Out of Food in the Last Year: Not on file  Transportation Needs:   . Lack of Transportation (Medical): Not on file  . Lack of Transportation  (Non-Medical): Not on file  Physical Activity:   . Days of Exercise per Week: Not on file  . Minutes of Exercise per Session: Not on file  Stress:   . Feeling of Stress : Not on file  Social Connections:   . Frequency of Communication with Friends and Family: Not on file  . Frequency of Social Gatherings with Friends and Family: Not on file  . Attends Religious Services: Not on file  . Active Member of Clubs or Organizations: Not on file  . Attends Archivist Meetings: Not on file  . Marital Status: Not on file    Allergies Allergies  Allergen Reactions  . Soma [Carisoprodol] Other (See Comments)    "  did a number on me"  . Doxazosin Other (See Comments)    dizziness    Outpatient Meds Home medications from the H+P and/or nursing med reconciliation reviewed.  Inpatient med list reviewed  _____________________________________________________________________ Objective   Exam:  Current vital signs  Patient Vitals for the past 8 hrs:  BP Temp Temp src Pulse Resp SpO2  08/18/20 0811 -- 97.9 F (36.6 C) Oral -- -- --  08/18/20 0800 (!) 162/80 -- -- 73 14 100 %  08/18/20 0700 (!) 154/101 -- -- 73 18 100 %  08/18/20 0600 (!) 152/67 -- -- 67 20 100 %  08/18/20 0509 (!) 158/75 -- -- 68 16 100 %  08/18/20 0400 (!) 164/79 (!) 97.5 F (36.4 C) Oral 70 14 100 %  08/18/20 0300 (!) 164/90 -- -- 67 17 100 %  08/18/20 0200 (!) 160/68 -- -- 66 (!) 21 100 %  08/18/20 0100 (!) 161/71 -- -- 69 13 100 %    Intake/Output Summary (Last 24 hours) at 08/18/2020 0843 Last data filed at 08/18/2020 7619 Gross per 24 hour  Intake 1936.11 ml  Output --  Net 1936.11 ml    Physical Exam:    General: this is a cachectic and frail elderly male patient in no acute distress  Eyes: sclera anicteric, no redness  ENT: oral mucosa dry and edentulous, no cervical or supraclavicular lymphadenopathy  CV: RRR without murmur, S1/S2, no JVD,, no peripheral edema  Resp: clear to  auscultation bilaterally, normal RR and fair effort noted  GI: soft, not distended or tympanic.  RLQ tenderness, with active bowel sounds. No guarding or palpable organomegaly noted  Skin; warm and dry, no rash or jaundice noted.  Multiple ecchymoses  Neuro: awake, alert and oriented x 3. Normal gross motor function and slow speech. Rectal exam (performed with assistance of medical assistant turning patient): Patient had semiformed or loose stool in his diaper.  DRE revealed decreased anal sphincter tone, no fecal impaction.  There is about 200 cc of urine in his condom catheter collection canister.  Labs:  CBC Latest Ref Rng & Units 08/18/2020 08/17/2020 07/20/2020  WBC 4.0 - 10.5 K/uL 7.3 6.8 7.1  Hemoglobin 13.0 - 17.0 g/dL 7.1(L) 7.4(L) 8.5(L)  Hematocrit 39 - 52 % 23.6(L) 24.4(L) 28.4(L)  Platelets 150 - 400 K/uL 65(L) 78(L) 93(L)    CMP Latest Ref Rng & Units 08/18/2020 08/17/2020 07/21/2020  Glucose 70 - 99 mg/dL 139(H) 153(H) 189(H)  BUN 8 - 23 mg/dL 13 15 7(L)  Creatinine 0.61 - 1.24 mg/dL 0.66 0.62 0.58(L)  Sodium 135 - 145 mmol/L 141 142 139  Potassium 3.5 - 5.1 mmol/L 2.8(L) <2.0(LL) 4.2  Chloride 98 - 111 mmol/L 102 100 104  CO2 22 - 32 mmol/L 26 29 28   Calcium 8.9 - 10.3 mg/dL 7.6(L) 7.8(L) 7.8(L)  Total Protein 6.5 - 8.1 g/dL - 6.2(L) -  Total Bilirubin 0.3 - 1.2 mg/dL - 0.7 -  Alkaline Phos 38 - 126 U/L - 693(H) -  AST 15 - 41 U/L - 84(H) -  ALT 0 - 44 U/L - 11 -    No results for input(s): INR in the last 168 hours. _________________________________________________________ Radiologic studies:  CLINICAL DATA:  Cholelithiasis, right lower quadrant pain radiating to right inguinal region, nausea/vomiting/diarrhea   EXAM: CT ABDOMEN AND PELVIS WITH CONTRAST   TECHNIQUE: Multidetector CT imaging of the abdomen and pelvis was performed using the standard protocol following bolus administration of intravenous contrast.   CONTRAST:  11mL OMNIPAQUE IOHEXOL 300  MG/ML  SOLN   COMPARISON:  07/05/2020   FINDINGS: Lower chest: There are small bilateral pleural effusions. Trace dependent lower lobe atelectasis. Small hiatal hernia.   Hepatobiliary: Cholelithiasis identified without cholecystitis. Liver is unremarkable.   Pancreas: There is diffuse pancreatic atrophy, stable.   Spleen: Normal in size without focal abnormality.   Adrenals/Urinary Tract: There is bilateral renal cortical atrophy and scarring, stable. No renal calculi or obstructive uropathy.   Bladder is moderately distended. High attenuation sediment layers dependently within the right lateral aspect of the bladder. No bladder wall thickening. The adrenals are unremarkable.   Stomach/Bowel: There is no bowel obstruction or ileus. There is a large amount of stool throughout the colon, most pronounced within the rectal vault. Distal colonic wall thickening at the rectosigmoid colon consistent with an element of stercoral colitis. There is a normal appendix in the right lower quadrant.   Vascular/Lymphatic: Aortic atherosclerosis. No enlarged abdominal or pelvic lymph nodes.   Reproductive: Prostate is unremarkable.   Other: No free fluid or free gas. No abdominal wall hernia.   Musculoskeletal: Multifocal sclerotic lesions are seen throughout the thoracic cage, thoracolumbar spine, bony pelvis. No acute displaced fracture. Reconstructed images demonstrate no additional findings.   IMPRESSION: 1. Large amount of stool throughout the colon, most pronounced within the rectal vault. Distal colonic wall thickening at the rectosigmoid colon consistent with an element of stercoral colitis. 2. Cholelithiasis without cholecystitis. 3. Small bilateral pleural effusions. 4. Stable diffuse bony metastases. 5. Aortic Atherosclerosis (ICD10-I70.0).     Electronically Signed   By: Randa Ngo M.D.   On: 08/17/2020 19:43   I personally reviewed the CT images as well as the  images from the 07/05/2020 CT scan.   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  Diffuse pain related to metastatic prostate cancer for which he is on hospice.  Incidental cholelithiasis   Need alkaline phosphatase from diffuse bony metastasis __________________  The CT scan this admission is similar in appearance to the October scan, and there is mild diffuse colonic dilatation, though it is less prominent now than on the October scan. There is abundant stool in the right colon, but the stool described in the rectum is liquid with an obvious air-fluid level. His abdominal exam does not reveal distention, and is certainly improved from what I saw when he was hospitalized in October.  This low-grade dysmotility that he has causes poor passage of formed stool from the right colon, but then poor fluid absorption and thus liquid stool at times.  Plan:  Given this patient's advanced illness and the fact that he is nearing the end of his life, I think conservative measures are warranted with the goal to relieve symptoms best we can with as little intervention as possible.  Diet as tolerated.  He needs IV fluid support since he is clinically volume depleted.  Aggressive potassium repletion  Continue his daily MiraLAX.  I am afraid there is no more we can offer this gentleman.   Thank you for the courtesy of this consult.  Please contact our service with any questions or concerns.  Signing off - Dr. Rush Landmark will be available the reminder of the week if needed for advice.  Nelida Meuse III Office: [phone]

## 2020-08-18 NOTE — Progress Notes (Signed)
Patient ID: Adrian Singh, male   DOB: August 25, 1934, 86 y.o.   MRN: [id number]  PROGRESS NOTE    Adrian Singh  VHQ:[id number] DOB: 1934/02/16 DOA: 08/17/2020 PCP: [name], MD   Brief Narrative:  86 year old male with history of metastatic prostate cancer to bone, adult failure to thrive over the past many months currently on home hospice, hypertension, paroxysmal A. fib, chronic diastolic CHF, admission in October 2021 for colonic ileus due to profound hypokalemia which required GI consultation which recommended conservative management and potassium replacement presented on 08/17/2020 with worsening abdominal pain with nausea and diarrhea.  CT of the abdomen and pelvis showed large amount of stool in colon, especially rectum,?  Stercoral colitis along with cholelithiasis without cholecystitis.  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  Stercoral colitis along with cholelithiasis without cholecystitis. -I have requested GI evaluation.  Follow recommendations.  Replace potassium  Hypokalemia -Potassium less than 2 on presentation.  Improving to 2.8.  Continue replacement orally and intravenously.  Paroxysmal A. Fib -Currently rate controlled and in sinus rhythm.  Severe malnutrition Failure to thrive Metastatic prostate cancer Hypoalbuminemia -Patient is severely malnourished and has worsening failure to thrive secondary to metastatic prostate cancer.  During last admission, oncology had recommended hospice care and that he is not a candidate for further cancer treatments. -His overall condition is worsening.  Adamantly denies any surgical intervention or anything that would help his chronic ileus.  He is  probably approaching end-of-life.  Although he is enrolled in hospice at home, he might have unrealistic expectations.  Will request palliative care consultation to further have goals of care discussion with patient/family.  He probably would benefit from residential hospice. -Nutrition consult  Anemia of chronic disease -Hemoglobin 7.1 this morning.  Will not transfuse and wait for palliative care consultation  Chronic thrombocytopenia -Platelets 65 this morning.  Will hold off on further blood work for tomorrow until palliative care discussions   DVT prophylaxis: SCDs Code Status: DNR Family Communication: None at bedside Disposition Plan: Status is: Observation.  The patient will require care spanning > 2 midnights and should be moved to inpatient because: Inpatient level of care appropriate due to severity of illness.  Still very hypokalemic requiring supplemental potassium.  Dispo: The patient is from: SNF              Anticipated d/c is to: SNF              Anticipated d/c date is: 1 day              Patient currently is not medically stable to d/c.   Consultants: GI/palliative care  Procedures: None  Antimicrobials: None   Subjective: Patient seen and examined at bedside.  Poor historian.  Complains of abdominal pain.  No overnight fever or vomiting reported.  Objective: Vitals:   08/18/20 0800 08/18/20 0811 08/18/20 0900 08/18/20 1000  BP: (!) 162/80  136/69 (!) 159/94  Pulse: 73  71 (!) 57  Resp: 14  15 16   Temp:  97.9 F (36.6 C)    TempSrc:  Oral    SpO2: 100%  100% 100%  Weight:      Height:        Intake/Output Summary (Last 24 hours)  at 08/18/2020 1036 Last data filed at 08/18/2020 1006 Gross per 24 hour  Intake 2086.06 ml  Output --  Net 2086.06 ml   Filed Weights   08/17/20 1622  Weight: 59 kg    Examination:  General exam: Elderly male lying in bed.  Extremely thinly built.  Looks very malnourished and cachectic.  Poor  historian. Respiratory system: Bilateral decreased breath sounds at bases with scattered crackles Cardiovascular system: S1 & S2 heard, intermittently bradycardic Gastrointestinal system: Abdomen is nondistended, soft and tender in the lower quadrant.  Bowel sounds heard. Extremities: No cyanosis, clubbing, edema  Central nervous system: Awake, poor historian.  No focal neurological deficits. Moving extremities Skin: No obvious ecchymosis/lesions Psychiatry: Flat affect.    Data Reviewed: I have personally reviewed following labs and imaging studies  CBC: Recent Labs  Lab 08/17/20 1744 08/18/20 0400  WBC 6.8 7.3  NEUTROABS 4.4  --   HGB 7.4* 7.1*  HCT 24.4* 23.6*  MCV 83.6 83.7  PLT 78* 65*   Basic Metabolic Panel: Recent Labs  Lab 08/17/20 1744 08/17/20 2149 08/18/20 0400  NA 142  --  141  K <2.0*  --  2.8*  CL 100  --  102  CO2 29  --  26  GLUCOSE 153*  --  139*  BUN 15  --  13  CREATININE 0.62  --  0.66  CALCIUM 7.8*  --  7.6*  MG  --  3.1* 2.4  PHOS  --  2.9  --    GFR: Estimated Creatinine Clearance: 57.4 mL/min (by C-G formula based on SCr of 0.66 mg/dL). Liver Function Tests: Recent Labs  Lab 08/17/20 1744  AST 84*  ALT 11  ALKPHOS 693*  BILITOT 0.7  PROT 6.2*  ALBUMIN 2.0*   No results for input(s): LIPASE, AMYLASE in the last 168 hours. No results for input(s): AMMONIA in the last 168 hours. Coagulation Profile: No results for input(s): INR, PROTIME in the last 168 hours. Cardiac Enzymes: No results for input(s): CKTOTAL, CKMB, CKMBINDEX, TROPONINI in the last 168 hours. BNP (last 3 results) No results for input(s): PROBNP in the last 8760 hours. HbA1C: No results for input(s): HGBA1C in the last 72 hours. CBG: No results for input(s): GLUCAP in the last 168 hours. Lipid Profile: No results for input(s): CHOL, HDL, LDLCALC, TRIG, CHOLHDL, LDLDIRECT in the last 72 hours. Thyroid Function Tests: No results for input(s): TSH, T4TOTAL,  FREET4, T3FREE, THYROIDAB in the last 72 hours. Anemia Panel: No results for input(s): VITAMINB12, FOLATE, FERRITIN, TIBC, IRON, RETICCTPCT in the last 72 hours. Sepsis Labs: Recent Labs  Lab 08/17/20 1744  LATICACIDVEN 1.2    Recent Results (from the past 240 hour(s))  Culture, blood (single)     Status: None (Preliminary result)   Collection Time: 08/17/20  5:44 PM   Specimen: BLOOD  Result Value Ref Range Status   Specimen Description   Final    BLOOD LEFT ANTECUBITAL Performed at [facility] [address] [place], [place] [address]    Special Requests   Final    BOTTLES DRAWN AEROBIC AND ANAEROBIC Blood Culture adequate volume Performed at [facility] [address], [place], [place] [address]    Culture   Final    NO GROWTH < 12 HOURS Performed at [facility] [address], [place], [place] [address]    Report Status PENDING  Incomplete  Resp Panel by RT-PCR (Flu A&B, Covid) Nasopharyngeal Swab  Status: None   Collection Time: 08/17/20  5:54 PM   Specimen: Nasopharyngeal Swab; Nasopharyngeal(NP) swabs in vial transport medium  Result Value Ref Range Status   SARS Coronavirus 2 by RT PCR NEGATIVE NEGATIVE Final    Comment: (NOTE) SARS-CoV-2 target nucleic acids are NOT DETECTED.  The SARS-CoV-2 RNA is generally detectable in upper respiratory specimens during the acute phase of infection. The lowest concentration of SARS-CoV-2 viral copies this assay can detect is 138 copies/mL. A negative result does not preclude SARS-Cov-2 infection and should not be used as the sole basis for treatment or other patient management decisions. A negative result may occur with  improper specimen collection/handling, submission of specimen other than nasopharyngeal swab, presence of viral mutation(s) within the areas targeted by this assay, and inadequate number of viral copies(<138 copies/mL). A negative result must be combined  with clinical observations, patient history, and epidemiological information. The expected result is Negative.  Fact Sheet for Patients:  EntrepreneurPulse.com.au  Fact Sheet for Healthcare Providers:  IncredibleEmployment.be  This test is no t yet approved or cleared by the [place] FDA and  has been authorized for detection and/or diagnosis of SARS-CoV-2 by FDA under an Emergency Use Authorization (EUA). This EUA will remain  in effect (meaning this test can be used) for the duration of the COVID-19 declaration under Section 564(b)(1) of the Act, 21 U.S.C.section 360bbb-3(b)(1), unless the authorization is terminated  or revoked sooner.       Influenza A by PCR NEGATIVE NEGATIVE Final   Influenza B by PCR NEGATIVE NEGATIVE Final    Comment: (NOTE) The Xpert Xpress SARS-CoV-2/FLU/RSV plus assay is intended as an aid in the diagnosis of influenza from Nasopharyngeal swab specimens and should not be used as a sole basis for treatment. Nasal washings and aspirates are unacceptable for Xpert Xpress SARS-CoV-2/FLU/RSV testing.  Fact Sheet for Patients: EntrepreneurPulse.com.au  Fact Sheet for Healthcare Providers: IncredibleEmployment.be  This test is not yet approved or cleared by the [place] FDA and has been authorized for detection and/or diagnosis of SARS-CoV-2 by FDA under an Emergency Use Authorization (EUA). This EUA will remain in effect (meaning this test can be used) for the duration of the COVID-19 declaration under Section 564(b)(1) of the Act, 21 U.S.C. section 360bbb-3(b)(1), unless the authorization is terminated or revoked.  Performed at [facility], [place] [address] [place], [place] [address]          Radiology Studies: CT Abdomen Pelvis W Contrast  Result Date: 08/17/2020 CLINICAL DATA:  Cholelithiasis, right lower quadrant pain radiating to right  inguinal region, nausea/vomiting/diarrhea EXAM: CT ABDOMEN AND PELVIS WITH CONTRAST TECHNIQUE: Multidetector CT imaging of the abdomen and pelvis was performed using the standard protocol following bolus administration of intravenous contrast. CONTRAST:  132mL OMNIPAQUE IOHEXOL 300 MG/ML  SOLN COMPARISON:  07/05/2020 FINDINGS: Lower chest: There are small bilateral pleural effusions. Trace dependent lower lobe atelectasis. Small hiatal hernia. Hepatobiliary: Cholelithiasis identified without cholecystitis. Liver is unremarkable. Pancreas: There is diffuse pancreatic atrophy, stable. Spleen: Normal in size without focal abnormality. Adrenals/Urinary Tract: There is bilateral renal cortical atrophy and scarring, stable. No renal calculi or obstructive uropathy. Bladder is moderately distended. High attenuation sediment layers dependently within the right lateral aspect of the bladder. No bladder wall thickening. The adrenals are unremarkable. Stomach/Bowel: There is no bowel obstruction or ileus. There is a large amount of stool throughout the colon, most pronounced within the rectal vault. Distal colonic wall thickening at the rectosigmoid colon consistent  with an element of stercoral colitis. There is a normal appendix in the right lower quadrant. Vascular/Lymphatic: Aortic atherosclerosis. No enlarged abdominal or pelvic lymph nodes. Reproductive: Prostate is unremarkable. Other: No free fluid or free gas. No abdominal wall hernia. Musculoskeletal: Multifocal sclerotic lesions are seen throughout the thoracic cage, thoracolumbar spine, bony pelvis. No acute displaced fracture. Reconstructed images demonstrate no additional findings. IMPRESSION: 1. Large amount of stool throughout the colon, most pronounced within the rectal vault. Distal colonic wall thickening at the rectosigmoid colon consistent with an element of stercoral colitis. 2. Cholelithiasis without cholecystitis. 3. Small bilateral pleural effusions. 4.  Stable diffuse bony metastases. 5. Aortic Atherosclerosis (ICD10-I70.0). Electronically Signed   By: Randa Ngo M.D.   On: 08/17/2020 19:43   DG Chest Port 1 View  Result Date: 08/17/2020 CLINICAL DATA:  Pt diagnosed with gallstones on Friday. Increased RLQ pain radiating to groin with N/V/D. EXAM: PORTABLE CHEST 1 VIEW COMPARISON:  07/17/2020 FINDINGS: Cardiac silhouette is normal in size. No mediastinal or hilar masses. Clear lungs.  No convincing pleural effusion and no pneumothorax. Skeletal structures are grossly intact. IMPRESSION: No active disease. Electronically Signed   By: Lajean Manes M.D.   On: 08/17/2020 17:30        Scheduled Meds: . potassium chloride  40 mEq Oral Q4H   Continuous Infusions: . 0.9 % NaCl with KCl 40 mEq / L 75 mL/hr at 08/18/20 0165          Aline August, MD [facility] 08/18/2020, 10:36 AM

## 2020-08-18 NOTE — ED Notes (Signed)
Pt moved to hospital bed and made comfortable. Pt placed on new male primofit at 70 mmHg.

## 2020-08-18 NOTE — ED Notes (Signed)
Palliative Care provider at bedside.

## 2020-08-18 NOTE — Consult Note (Signed)
Consultation Note Date: 08/18/2020   Patient Name: Adrian Singh  DOB: Oct 18, 1933  MRN: [id number]  Age / Sex: 86 y.o., male  PCP: [name], MD Referring Physician: Aline August, MD  Reason for Consultation: Establishing goals of care and Pain control  HPI/Patient Profile: 86 y.o. male  with past medical history of metastatic prostate CA to bone, adult failure to thrive, HTN, PAF, CHF, ileus admitted on 08/17/2020 with abdominal pain, diarrhea, and ileus.  Palliative consulted for goals of care.   Clinical Assessment and Goals of Care: I met today with Adrian Singh and his daughters.  We discussed clinical course as well as wishes moving forward in regard to care plan this hospitalization.  We discussed difference between a aggressive medical intervention path and a palliative, comfort focused care path.  Values and goals of care important to patient and family were attempted to be elicited.  We also discussed his pain. He reports that predominant pain at this time is in his abdomen and also in his right hip.  He has some relief from fentanyl but reports that pain recurs shortly after receiving dose of medication.  Concept of Hospice and Palliative Care were discussed.  Questions and concerns addressed.   PMT will continue to support holistically.  SUMMARY OF RECOMMENDATIONS   - Continue current care until tomorrow at which point they would like to reassess his situation during a family meeting to include daughters and his wife.  We discussed regarding comfort care and hospice.  He and family are open to the idea of transition back to [place] with hospice but want to see how he does clinically overnight and also have meeting to discuss with his wife as well. - Pain: Addition of voltaren and lidocaine patch to his hip as requested.  Transition from fentanyl to dilaudid to see if longer acting  opioid is more effective to relieve his pain.  Code Status/Advance Care Planning:  DNR  Prognosis:   < 6 months  Discharge Planning: To Be Determined      Primary Diagnoses: Present on Admission: . Severe protein-calorie malnutrition ([place]) . Thrombocytopenia ([place]) . Anemia . Ileus ([place]) . Failure to thrive in adult . PAF (paroxysmal atrial fibrillation) ([place]) . Hypokalemia . Prolonged QT interval . Abdominal pain   I have reviewed the medical record, interviewed the patient and family, and examined the patient. The following aspects are pertinent.  Past Medical History:  Diagnosis Date  . Arthritis    "legs, back" (10/06/2015)  . Atrial fibrillation ([place])    x1  . Atrial fibrillation ([place])   . CAD (coronary artery disease)    nonobstructive  . Cancer ([place])    skin cancer on ear (froze it off) and back (cut it off)  . Dysrhythmia   . Esophageal reflux    hx of  . History of kidney stones   . Hx of heart artery stent   . Hypertrophy of prostate without urinary obstruction and other lower urinary tract symptoms (LUTS)   .  Osteoarthrosis, unspecified whether generalized or localized, unspecified site   . Prostate cancer ([place])   . Skin cancer   . Stroke [facility]) 03/2014   "had a series of mini strokes; maybe 4"; denies residual on 10/06/2015  . Thrombocytopenia ([place])   . Type II diabetes mellitus ([place])    type 2  . Unspecified essential hypertension    Social History   Socioeconomic History  . Marital status: Married    Spouse name: Not on file  . Number of children: 4  . Years of education: Not on file  . Highest education level: Not on file  Occupational History  . Occupation: Radiographer, 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[employer]    Employer: RETIRED  Tobacco Use  . Smoking status: Former Smoker    Years: 3.00    Types: Cigarettes  . Smokeless tobacco: Never Used  . Tobacco comment: " Quit smoking by age 29; was a someday smoker "  Vaping Use  . Vaping Use: Never used    Substance and Sexual Activity  . Alcohol use: No  . Drug use: No  . Sexual activity: Never  Other Topics Concern  . Not on file  Social History Narrative   No living will   Plans wife and then children to make health care decisions for him if unable   Would request at least attempts at resuscitation but no prolonged life support   Doesn't think he would want tube feeds if cognitively unaware   Social Determinants of Health   Financial Resource Strain:   . Difficulty of Paying Living Expenses: Not on file  Food Insecurity:   . Worried About Charity fundraiser in the Last Year: Not on file  . Ran Out of Food in the Last Year: Not on file  Transportation Needs:   . Lack of Transportation (Medical): Not on file  . Lack of Transportation (Non-Medical): Not on file  Physical Activity:   . Days of Exercise per Week: Not on file  . Minutes of Exercise per Session: Not on file  Stress:   . Feeling of Stress : Not on file  Social Connections:   . Frequency of Communication with Friends and Family: Not on file  . Frequency of Social Gatherings with Friends and Family: Not on file  . Attends Religious Services: Not on file  . Active Member of Clubs or Organizations: Not on file  . Attends Archivist Meetings: Not on file  . Marital Status: Not on file   Family History  Problem Relation Age of Onset  . Stroke Father   . Peripheral vascular disease Father        amputation  . Heart failure Mother        CHF  . Coronary artery disease Mother   . Heart attack Mother 71       Multiple  . COPD Brother   . Heart disease Brother   . Cancer Brother        Bone  . Lung cancer Sister    Scheduled Meds: . diclofenac Sodium  2 g Topical QID  . lidocaine  1 patch Transdermal Q24H   Continuous Infusions: . 0.9 % NaCl with KCl 40 mEq / L 75 mL/hr at 08/18/20 1836   PRN Meds:.acetaminophen **OR** acetaminophen, HYDROmorphone (DILAUDID) injection, oxyCODONE Medications Prior  to Admission:  Prior to Admission medications   Medication Sig Start Date End Date Taking? Authorizing Provider  acetaminophen (TYLENOL) 325 MG tablet Take 650 mg by  mouth every 12 (twelve) hours as needed for mild pain or moderate pain.   Yes [provider]  ascorbic acid (VITAMIN C) 500 MG tablet Take 1,000 mg by mouth daily. 07/29/20  Yes [provider]  B Complex Vitamins (VITAMIN B COMPLEX PO) Take 1 tablet by mouth daily.   Yes [provider]  BAYER MICROLET LANCETS lancets Use to test blood sugar once daily dx: E11.40 10/18/15  Yes [name], MD  Blood Glucose Monitoring Suppl (CONTOUR NEXT EZ MONITOR) w/Device KIT USE TO TEST BLOOD SUGAR ONCE DAILY. Dx Code E11.40 06/13/16  Yes [name], MD  calcium carbonate (CALCIUM 600) 1500 (600 Ca) MG TABS tablet Take 600 mg of elemental calcium by mouth daily.    Yes [provider]  cyanocobalamin 1000 MCG tablet Take 1 tablet (1,000 mcg total) by mouth at bedtime. 07/14/20  Yes Barb Merino, MD  ergocalciferol (VITAMIN D2) 1.25 MG (50000 UT) capsule Take 50,000 Units by mouth once a week. Saturdays   Yes [provider]  furosemide (LASIX) 40 MG tablet Take 40 mg by mouth daily as needed for fluid.    Yes [provider]  glucose blood (CONTOUR NEXT TEST) test strip Check blood sugar once daily and as directed.Dx Code E11.40 03/19/19  Yes [name], MD  lidocaine (LIDODERM) 5 % Place 1 patch onto the skin daily. Remove & Discard patch within 12 hours or as directed by MD 07/27/20  Yes Arrien, Jimmy Picket, MD  metoprolol tartrate (LOPRESSOR) 50 MG tablet Take 1 tablet (50 mg total) by mouth in the morning, at noon, and at bedtime. 07/15/20 07/15/21 Yes Barb Merino, MD  Multiple Vitamin (MULTIVITAMIN WITH MINERALS) TABS tablet Take 1 tablet by mouth daily.    Yes [provider]  nitroGLYCERIN (NITROSTAT) 0.4 MG SL tablet Place 1 tablet (0.4 mg total) under  the tongue every 5 (five) minutes as needed for chest pain. Patient taking differently: Place 0.4 mg under the tongue every 5 (five) minutes as needed for chest pain. If no relief, call MD 05/15/18  Yes [name], MD  polyethylene glycol (MIRALAX / GLYCOLAX) 17 g packet Take 17 g by mouth daily as needed for mild constipation.    Yes [provider]  traMADol (ULTRAM) 50 MG tablet Take 1 tablet (50 mg total) by mouth every 6 (six) hours as needed for moderate pain. 07/22/20  Yes Arrien, Jimmy Picket, MD   Allergies  Allergen Reactions  . Soma [Carisoprodol] Other (See Comments)    "did a number on me"  . Doxazosin Other (See Comments)    dizziness   Review of Systems  Constitutional: Positive for activity change, appetite change and fatigue.  Gastrointestinal: Positive for abdominal pain and diarrhea.  Neurological: Positive for weakness.    Physical Exam  General: Alert, awake, in moderate distress.  Frail and cachectic  HEENT: No bruits, no goiter, no JVD Heart: Regular rate and rhythm. No murmur appreciated. Lungs: Decreased air movement, crackles in bases Abdomen: Soft, Globally tender, nondistended, positive bowel sounds.  Ext: No significant edema Skin: Warm and dry Vital Signs: BP (!) 152/68 (BP Location: Left Arm)   Pulse 79   Temp 97.6 F (36.4 C) (Oral)   Resp (!) 23   Ht 6' (1.829 m) Comment: AS per patient  Wt 62.3 kg   SpO2 100%   BMI 18.63 kg/m  Pain Scale: 0-10   Pain Score: 8    SpO2: SpO2:  100 % O2 Device:SpO2: 100 % O2 Flow Rate: .O2 Flow Rate (L/min): 2 L/min  IO: Intake/output summary:   Intake/Output Summary (Last 24 hours) at 08/18/2020 2105 Last data filed at 08/18/2020 1836 Gross per 24 hour  Intake 2159.84 ml  Output --  Net 2159.84 ml    LBM:   Baseline Weight: Weight: 59 kg Most recent weight: Weight: 62.3 kg     Palliative Assessment/Data:   Flowsheet Rows     Most Recent Value  Intake Tab  Referral  Department Hospitalist  Unit at Time of Referral ER  Palliative Care Primary Diagnosis Cancer  Date Notified 08/18/20  Palliative Care Type Return patient Palliative Care  Reason for referral Clarify Goals of Care, Pain  Date of Admission 08/17/20  Date first seen by Palliative Care 08/18/20  # of days Palliative referral response time 0 Day(s)  # of days IP prior to Palliative referral 1  Clinical Assessment  Palliative Performance Scale Score 30%  Psychosocial & Spiritual Assessment  Palliative Care Outcomes  Patient/Family meeting held? Yes  Who was at the meeting? Patient, 2 daughters      Time In: 1815 Time Out: 1905 Time Total: 32 Greater than 50%  of this time was spent counseling and coordinating care related to the above assessment and plan.  Signed by: Micheline Rough, MD   Please contact Palliative Medicine Team phone at [phone] for questions and concerns.  For individual provider: See Shea Evans

## 2020-08-18 NOTE — ED Notes (Signed)
Changed patients brief (bowel movement) Warm Blankets given  Repositioned patient (pillow wedge under knees/ under legs to elevate heels off bed) Music playing for comfort

## 2020-08-19 ENCOUNTER — Other Ambulatory Visit: Payer: Self-pay

## 2020-08-19 DIAGNOSIS — C61 Malignant neoplasm of prostate: Secondary | ICD-10-CM

## 2020-08-19 DIAGNOSIS — K567 Ileus, unspecified: Secondary | ICD-10-CM | POA: Diagnosis not present

## 2020-08-19 DIAGNOSIS — R627 Adult failure to thrive: Secondary | ICD-10-CM | POA: Diagnosis not present

## 2020-08-19 DIAGNOSIS — R109 Unspecified abdominal pain: Secondary | ICD-10-CM | POA: Diagnosis not present

## 2020-08-19 DIAGNOSIS — Z7189 Other specified counseling: Secondary | ICD-10-CM | POA: Diagnosis not present

## 2020-08-19 DIAGNOSIS — K5981 Ogilvie syndrome: Secondary | ICD-10-CM | POA: Diagnosis not present

## 2020-08-19 DIAGNOSIS — E43 Unspecified severe protein-calorie malnutrition: Secondary | ICD-10-CM | POA: Diagnosis not present

## 2020-08-19 DIAGNOSIS — C7951 Secondary malignant neoplasm of bone: Secondary | ICD-10-CM

## 2020-08-19 DIAGNOSIS — I480 Paroxysmal atrial fibrillation: Secondary | ICD-10-CM | POA: Diagnosis not present

## 2020-08-19 LAB — BASIC METABOLIC PANEL
Anion gap: 12 (ref 5–15)
BUN: 10 mg/dL (ref 8–23)
CO2: 27 mmol/L (ref 22–32)
Calcium: 8 mg/dL — ABNORMAL LOW (ref 8.9–10.3)
Chloride: 106 mmol/L (ref 98–111)
Creatinine, Ser: 0.5 mg/dL — ABNORMAL LOW (ref 0.61–1.24)
GFR, Estimated: >60 mL/min (ref >60)
Glucose, Bld: 158 mg/dL — ABNORMAL HIGH (ref 70–99)
Potassium: 3.3 mmol/L — ABNORMAL LOW (ref 3.5–5.1)
Sodium: 145 mmol/L (ref 135–145)

## 2020-08-19 LAB — MAGNESIUM: Magnesium: 2.1 mg/dL (ref 1.7–2.4)

## 2020-08-19 MED ORDER — OXYCODONE HCL 5 MG PO TABS
5.0000 mg | ORAL_TABLET | ORAL | Status: DC | PRN
  Administered 2020-08-19 – 2020-08-20 (×2): 5 mg via ORAL
  Filled 2020-08-19 (×2): qty 1

## 2020-08-19 MED ORDER — BOOST / RESOURCE BREEZE PO LIQD CUSTOM
1.0000 | Freq: Two times a day (BID) | ORAL | Status: DC
  Administered 2020-08-20: 1 via ORAL

## 2020-08-19 MED ORDER — ENSURE ENLIVE PO LIQD
237.0000 mL | ORAL | Status: DC
  Administered 2020-08-20: 237 mL via ORAL

## 2020-08-19 MED ORDER — POLYETHYLENE GLYCOL 3350 17 G PO PACK
17.0000 g | PACK | Freq: Every day | ORAL | Status: DC
  Administered 2020-08-19 – 2020-08-20 (×2): 17 g via ORAL
  Filled 2020-08-19 (×2): qty 1

## 2020-08-19 MED ORDER — POTASSIUM CHLORIDE CRYS ER 20 MEQ PO TBCR
40.0000 meq | EXTENDED_RELEASE_TABLET | ORAL | Status: AC
  Administered 2020-08-19 (×2): 40 meq via ORAL
  Filled 2020-08-19 (×2): qty 2

## 2020-08-19 MED ORDER — ADULT MULTIVITAMIN W/MINERALS CH
1.0000 | ORAL_TABLET | Freq: Every day | ORAL | Status: DC
  Administered 2020-08-19 – 2020-08-20 (×2): 1 via ORAL
  Filled 2020-08-19 (×2): qty 1

## 2020-08-19 NOTE — Progress Notes (Addendum)
Patient ID: Adrian Singh, male   DOB: 12-18-1933, 86 y.o.   MRN: [id number]  PROGRESS NOTE    Adrian Singh  Adrian Singh DOB: Feb 08, 1934 DOA: 08/17/2020 PCP: [name], MD   Brief Narrative:  86 year old male with history of metastatic prostate cancer to bone, adult failure to thrive over the past many months currently on home hospice, hypertension, paroxysmal A. fib, chronic diastolic CHF, admission in October 2021 for colonic ileus due to profound hypokalemia which required GI consultation which recommended conservative management and potassium replacement presented on 08/17/2020 with worsening abdominal pain with nausea and diarrhea.  CT of the abdomen and pelvis showed large amount of stool in colon, especially rectum,?  Stercoral colitis along with cholelithiasis without cholecystitis.  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  Stercoral colitis along with cholelithiasis without cholecystitis. -GI evaluation appreciated: GI has signed off and recommended conservative management with excessive potassium replacement and continue daily MiraLAX.  GI recommends that patient is not a candidate for any kind of intervention as he is nearing the end of his life.  Hypokalemia -Potassium less than 2 on presentation.  Potassium pending this morning.  Paroxysmal A. Fib -Currently rate controlled and in sinus rhythm.  Severe malnutrition Failure to thrive Metastatic prostate cancer Hypoalbuminemia -Patient is severely malnourished and has worsening failure to thrive secondary to metastatic prostate cancer.  During last admission, oncology had recommended hospice care and that he is not a candidate for further cancer  treatments. -His overall condition is worsening.  Adamantly denies any surgical intervention or anything that would help his chronic ileus.  He is probably approaching end-of-life.  Although he is enrolled in hospice at home, he might have unrealistic expectations.  -Palliative care following.  He probably would benefit from residential hospice.  Anemia of chronic disease -Hemoglobin 7.1 on 08/18/2020.  Did not repeat labs pending palliative care evaluation.  Chronic thrombocytopenia -Platelets 65 on 08/18/2020.  Will hold off on further blood work for tomorrow until palliative care discussions   DVT prophylaxis: SCDs Code Status: DNR Family Communication: Spoke to daughter/[name] on phone on 08/18/2020 Disposition Plan: Status is: Inpatient because: Inpatient level of care appropriate due to severity of illness.    Dispo: The patient is from: SNF              Anticipated d/c is to: SNF with possible hospice              Anticipated d/c date is: 1 day              Patient currently is not medically stable to d/c.   Consultants: GI/palliative care  Procedures: None  Antimicrobials: None   Subjective: Patient seen and examined at bedside.  Sleepy, wakes up very slightly.  Poor historian.  No overnight fever or vomiting reported.  Nursing staff reported that patient required IV Dilaudid overnight  Objective: Vitals:   08/18/20 1800 08/18/20 1943 08/18/20 2056 08/19/20 0545  BP: (!) 141/55 (!) 152/68  (!) 162/71  Pulse: 76 79  83  Resp: (!) 23   20  Temp:  97.6 F (36.4 C)  97.6 F (36.4 C)  TempSrc:  Oral    SpO2: 100% 100%  100%  Weight:   62.3 kg  Height:   6' (1.829 m)     Intake/Output Summary (Last 24 hours) at 08/19/2020 0756 Last data filed at 08/18/2020 2255 Gross per 24 hour  Intake 882.4 ml  Output 175 ml  Net 707.4 ml   Filed Weights   08/17/20 1622 08/18/20 2056  Weight: 59 kg 62.3 kg    Examination:  General exam: Poor historian.  Looks very  malnourished and cachectic.  Sleepy, wakes up very slightly, hardly answers any questions. Respiratory system: Decreased breath sounds at bases bilaterally with scattered crackles cardiovascular system: Rate controlled, S1-S2 heard Gastrointestinal system: Abdomen is nondistended, soft and has lower quadrant tenderness.  Bowel sounds are heard.   Extremities: Mild lower extremity edema present.  No clubbing  Data Reviewed: I have personally reviewed following labs and imaging studies  CBC: Recent Labs  Lab 08/17/20 1744 08/18/20 0400  WBC 6.8 7.3  NEUTROABS 4.4  --   HGB 7.4* 7.1*  HCT 24.4* 23.6*  MCV 83.6 83.7  PLT 78* 65*   Basic Metabolic Panel: Recent Labs  Lab 08/17/20 1744 08/17/20 2149 08/18/20 0400  NA 142  --  141  K <2.0*  --  2.8*  CL 100  --  102  CO2 29  --  26  GLUCOSE 153*  --  139*  BUN 15  --  13  CREATININE 0.62  --  0.66  CALCIUM 7.8*  --  7.6*  MG  --  3.1* 2.4  PHOS  --  2.9  --    GFR: Estimated Creatinine Clearance: 60.6 mL/min (by C-G formula based on SCr of 0.66 mg/dL). Liver Function Tests: Recent Labs  Lab 08/17/20 1744  AST 84*  ALT 11  ALKPHOS 693*  BILITOT 0.7  PROT 6.2*  ALBUMIN 2.0*   No results for input(s): LIPASE, AMYLASE in the last 168 hours. No results for input(s): AMMONIA in the last 168 hours. Coagulation Profile: No results for input(s): INR, PROTIME in the last 168 hours. Cardiac Enzymes: No results for input(s): CKTOTAL, CKMB, CKMBINDEX, TROPONINI in the last 168 hours. BNP (last 3 results) No results for input(s): PROBNP in the last 8760 hours. HbA1C: No results for input(s): HGBA1C in the last 72 hours. CBG: No results for input(s): GLUCAP in the last 168 hours. Lipid Profile: No results for input(s): CHOL, HDL, LDLCALC, TRIG, CHOLHDL, LDLDIRECT in the last 72 hours. Thyroid Function Tests: No results for input(s): TSH, T4TOTAL, FREET4, T3FREE, THYROIDAB in the last 72 hours. Anemia Panel: No results for  input(s): VITAMINB12, FOLATE, FERRITIN, TIBC, IRON, RETICCTPCT in the last 72 hours. Sepsis Labs: Recent Labs  Lab 08/17/20 1744  LATICACIDVEN 1.2    Recent Results (from the past 240 hour(s))  Culture, blood (single)     Status: None (Preliminary result)   Collection Time: 08/17/20  5:44 PM   Specimen: BLOOD  Result Value Ref Range Status   Specimen Description   Final    BLOOD LEFT ANTECUBITAL Performed at [facility] [address], [place], [place] [address]    Special Requests   Final    BOTTLES DRAWN AEROBIC AND ANAEROBIC Blood Culture adequate volume Performed at [facility] [address], [place], [place] [address]    Culture   Final    NO GROWTH 2 DAYS Performed at [facility] [address], [place], [place] [address]    Report Status PENDING  Incomplete  Resp Panel by RT-PCR (Flu A&B, Covid) Nasopharyngeal Swab  Status: None   Collection Time: 08/17/20  5:54 PM   Specimen: Nasopharyngeal Swab; Nasopharyngeal(NP) swabs in vial transport medium  Result Value Ref Range Status   SARS Coronavirus 2 by RT PCR NEGATIVE NEGATIVE Final    Comment: (NOTE) SARS-CoV-2 target nucleic acids are NOT DETECTED.  The SARS-CoV-2 RNA is generally detectable in upper respiratory specimens during the acute phase of infection. The lowest concentration of SARS-CoV-2 viral copies this assay can detect is 138 copies/mL. A negative result does not preclude SARS-Cov-2 infection and should not be used as the sole basis for treatment or other patient management decisions. A negative result may occur with  improper specimen collection/handling, submission of specimen other than nasopharyngeal swab, presence of viral mutation(s) within the areas targeted by this assay, and inadequate number of viral copies(<138 copies/mL). A negative result must be combined with clinical observations, patient history, and epidemiological information. The  expected result is Negative.  Fact Sheet for Patients:  EntrepreneurPulse.com.au  Fact Sheet for Healthcare Providers:  IncredibleEmployment.be  This test is no t yet approved or cleared by the [place] FDA and  has been authorized for detection and/or diagnosis of SARS-CoV-2 by FDA under an Emergency Use Authorization (EUA). This EUA will remain  in effect (meaning this test can be used) for the duration of the COVID-19 declaration under Section 564(b)(1) of the Act, 21 U.S.C.section 360bbb-3(b)(1), unless the authorization is terminated  or revoked sooner.       Influenza A by PCR NEGATIVE NEGATIVE Final   Influenza B by PCR NEGATIVE NEGATIVE Final    Comment: (NOTE) The Xpert Xpress SARS-CoV-2/FLU/RSV plus assay is intended as an aid in the diagnosis of influenza from Nasopharyngeal swab specimens and should not be used as a sole basis for treatment. Nasal washings and aspirates are unacceptable for Xpert Xpress SARS-CoV-2/FLU/RSV testing.  Fact Sheet for Patients: EntrepreneurPulse.com.au  Fact Sheet for Healthcare Providers: IncredibleEmployment.be  This test is not yet approved or cleared by the [place] FDA and has been authorized for detection and/or diagnosis of SARS-CoV-2 by FDA under an Emergency Use Authorization (EUA). This EUA will remain in effect (meaning this test can be used) for the duration of the COVID-19 declaration under Section 564(b)(1) of the Act, 21 U.S.C. section 360bbb-3(b)(1), unless the authorization is terminated or revoked.  Performed at [facility], [place] [address] [place], [place] [address]          Radiology Studies: CT Abdomen Pelvis W Contrast  Result Date: 08/17/2020 CLINICAL DATA:  Cholelithiasis, right lower quadrant pain radiating to right inguinal region, nausea/vomiting/diarrhea EXAM: CT ABDOMEN AND PELVIS WITH CONTRAST  TECHNIQUE: Multidetector CT imaging of the abdomen and pelvis was performed using the standard protocol following bolus administration of intravenous contrast. CONTRAST:  134mL OMNIPAQUE IOHEXOL 300 MG/ML  SOLN COMPARISON:  07/05/2020 FINDINGS: Lower chest: There are small bilateral pleural effusions. Trace dependent lower lobe atelectasis. Small hiatal hernia. Hepatobiliary: Cholelithiasis identified without cholecystitis. Liver is unremarkable. Pancreas: There is diffuse pancreatic atrophy, stable. Spleen: Normal in size without focal abnormality. Adrenals/Urinary Tract: There is bilateral renal cortical atrophy and scarring, stable. No renal calculi or obstructive uropathy. Bladder is moderately distended. High attenuation sediment layers dependently within the right lateral aspect of the bladder. No bladder wall thickening. The adrenals are unremarkable. Stomach/Bowel: There is no bowel obstruction or ileus. There is a large amount of stool throughout the colon, most pronounced within the rectal vault. Distal colonic wall thickening at the rectosigmoid colon consistent  with an element of stercoral colitis. There is a normal appendix in the right lower quadrant. Vascular/Lymphatic: Aortic atherosclerosis. No enlarged abdominal or pelvic lymph nodes. Reproductive: Prostate is unremarkable. Other: No free fluid or free gas. No abdominal wall hernia. Musculoskeletal: Multifocal sclerotic lesions are seen throughout the thoracic cage, thoracolumbar spine, bony pelvis. No acute displaced fracture. Reconstructed images demonstrate no additional findings. IMPRESSION: 1. Large amount of stool throughout the colon, most pronounced within the rectal vault. Distal colonic wall thickening at the rectosigmoid colon consistent with an element of stercoral colitis. 2. Cholelithiasis without cholecystitis. 3. Small bilateral pleural effusions. 4. Stable diffuse bony metastases. 5. Aortic Atherosclerosis (ICD10-I70.0).  Electronically Signed   By: Randa Ngo M.D.   On: 08/17/2020 19:43   DG Chest Port 1 View  Result Date: 08/17/2020 CLINICAL DATA:  Pt diagnosed with gallstones on Friday. Increased RLQ pain radiating to groin with N/V/D. EXAM: PORTABLE CHEST 1 VIEW COMPARISON:  07/17/2020 FINDINGS: Cardiac silhouette is normal in size. No mediastinal or hilar masses. Clear lungs.  No convincing pleural effusion and no pneumothorax. Skeletal structures are grossly intact. IMPRESSION: No active disease. Electronically Signed   By: Lajean Manes M.D.   On: 08/17/2020 17:30        Scheduled Meds: . diclofenac Sodium  2 g Topical QID  . lidocaine  1 patch Transdermal Q24H   Continuous Infusions: . 0.9 % NaCl with KCl 40 mEq / L 75 mL/hr at 08/18/20 1836          Aline August, MD [facility] 08/19/2020, 7:56 AM

## 2020-08-19 NOTE — Progress Notes (Addendum)
Daily Progress Note   Patient Name: Adrian Singh       Date: 08/19/2020 DOB: [birth year]  Age: 86 y.o. MRN#: [id number] Attending Physician: Aline August, MD Primary Care Physician: [name], MD Admit Date: 08/17/2020  Reason for Consultation/Follow-up: Establishing goals of care  Subjective: I met today with Adrian Singh, his daughters, and his wife. We discussed clinical course as well as wishes moving forward in light of continued decline in light of his metastatic cancer.  We discussed difference between a aggressive medical intervention path and a palliative, comfort focused care path.  Values and goals of care important to patient and family were attempted to be elicited.  Concept of Hospice and Palliative Care were discussed.  Questions and concerns addressed.   PMT will continue to support holistically.  Length of Stay: 1  Current Medications: Scheduled Meds:  . diclofenac Sodium  2 g Topical QID  . feeding supplement  1 Container Oral BID BM  . feeding supplement  237 mL Oral Q24H  . lidocaine  1 patch Transdermal Q24H  . multivitamin with minerals  1 tablet Oral Daily  . polyethylene glycol  17 g Oral Daily    Continuous Infusions: . 0.9 % NaCl with KCl 40 mEq / L Stopped (08/19/20 1539)    PRN Meds: acetaminophen **OR** acetaminophen, HYDROmorphone (DILAUDID) injection, oxyCODONE  Physical Exam        General: Alert, awake, in no acute distress.  HEENT: No bruits, no goiter, no JVD Heart: Regular rate and rhythm. Lungs: Good air movement  Ext: No significant edema Skin: Warm and dry Neuro: Grossly intact, nonfocal.  Vital Signs: BP (!) 151/67 (BP Location: Right Arm)   Pulse 80   Temp 98.4 F (36.9 C) (Oral)   Resp 18   Ht 6' (1.829 m)  Comment: AS per patient  Wt 62.3 kg   SpO2 96%   BMI 18.63 kg/m  SpO2: SpO2: 96 % O2 Device: O2 Device: Room Air O2 Flow Rate: O2 Flow Rate (L/min): 2 L/min  Intake/output summary:   Intake/Output Summary (Last 24 hours) at 08/19/2020 2057 Last data filed at 08/19/2020 1849 Gross per 24 hour  Intake 400.24 ml  Output 375 ml  Net 25.24 ml   LBM: Last BM Date:  (pt unable to recall) Baseline Weight: Weight:  59 kg Most recent weight: Weight: 62.3 kg       Palliative Assessment/Data:    Flowsheet Rows     Most Recent Value  Intake Tab  Referral Department Hospitalist  Unit at Time of Referral ER  Palliative Care Primary Diagnosis Cancer  Date Notified 08/18/20  Palliative Care Type Return patient Palliative Care  Reason for referral Clarify Goals of Care, Pain  Date of Admission 08/17/20  Date first seen by Palliative Care 08/18/20  # of days Palliative referral response time 0 Day(s)  # of days IP prior to Palliative referral 1  Clinical Assessment  Palliative Performance Scale Score 30%  Psychosocial & Spiritual Assessment  Palliative Care Outcomes  Patient/Family meeting held? Yes  Who was at the meeting? Patient, 2 daughters      Patient Active Problem List   Diagnosis Date Noted  . Abdominal pain 08/18/2020  . Chronic diastolic CHF (congestive heart failure) ([place]) 07/22/2020  . Failure to thrive in adult 07/17/2020  . Back pain 07/17/2020  . Prolonged QT interval 07/17/2020  . Chronic systolic CHF (congestive heart failure) ([place]) 07/17/2020  . Ileus ([place]) 07/05/2020  . Hypokalemia 07/05/2020  . Atrial fibrillation with RVR ([place]) 07/05/2020  . Postural dizziness with presyncope 06/09/2020  . Severe protein-calorie malnutrition ([place]) 05/06/2020  . Palliative care by specialist   . Goals of care, counseling/discussion   . DNR (do not resuscitate) discussion   . Adult failure to thrive   . Need for emotional support   . Pressure ulcer of back 05/04/2020    . Stroke ([place]) 05/02/2020  . General weakness 04/30/2020  . Fall at home, initial encounter 04/30/2020  . Anemia 04/30/2020  . Elevated CK 04/30/2020  . Chronic kidney disease, stage 3a ([place]) 04/30/2020  . Elevated troponin 04/30/2020  . Fatigue 04/29/2020  . Malnutrition of mild degree ([place]) 04/06/2019  . Diabetic foot ulcer ([place]) 04/06/2019  . Mood disorder ([place]) 04/06/2019  . Aortic atherosclerosis ([place]) 02/13/2017  . Prostate cancer ([place]) 02/13/2017  . Primary malignant neoplasm of prostate metastatic to bone ([place]) 02/13/2017  . Orthostatic hypotension 03/19/2016  . CAD S/P percutaneous coronary angioplasty 02/28/2016  . Atherosclerosis of native coronary artery of native heart with angina pectoris ([place])   . Abnormal nuclear stress test 09/22/2015  . Thrombocytopenia ([place])   . Advanced directives, counseling/discussion 09/27/2014  . PAF (paroxysmal atrial fibrillation) ([place]) 04/12/2014  . History of CVA (cerebrovascular accident) 03/31/2014  . CKD stage 3 due to type 2 diabetes mellitus ([place])   . Uncontrolled type 2 diabetes mellitus with diabetic polyneuropathy, without long-term current use of insulin ([place]) 11/05/2011  . Routine general medical examination at a health care facility 05/07/2011  . Essential hypertension 12/23/2009  . GERD 12/23/2009  . BPH (benign prostatic hyperplasia) 12/23/2009  . OSTEOARTHRITIS 12/23/2009    Palliative Care Assessment & Plan   Patient Profile: 86 y.o. male  with past medical history of metastatic prostate CA to bone, adult failure to thrive, HTN, PAF, CHF, ileus admitted on 08/17/2020 with abdominal pain, diarrhea, and ileus.  Palliative consulted for goals of care.    Assessment: Patient Active Problem List   Diagnosis Date Noted  . Abdominal pain 08/18/2020  . Chronic diastolic CHF (congestive heart failure) ([place]) 07/22/2020  . Failure to thrive in adult 07/17/2020  . Back pain 07/17/2020  . Prolonged QT interval 07/17/2020  .  Chronic systolic CHF (congestive heart failure) ([place]) 07/17/2020  . Ileus ([place]) 07/05/2020  . Hypokalemia 07/05/2020  .  Atrial fibrillation with RVR ([place]) 07/05/2020  . Postural dizziness with presyncope 06/09/2020  . Severe protein-calorie malnutrition ([place]) 05/06/2020  . Palliative care by specialist   . Goals of care, counseling/discussion   . DNR (do not resuscitate) discussion   . Adult failure to thrive   . Need for emotional support   . Pressure ulcer of back 05/04/2020  . Stroke ([place]) 05/02/2020  . General weakness 04/30/2020  . Fall at home, initial encounter 04/30/2020  . Anemia 04/30/2020  . Elevated CK 04/30/2020  . Chronic kidney disease, stage 3a ([place]) 04/30/2020  . Elevated troponin 04/30/2020  . Fatigue 04/29/2020  . Malnutrition of mild degree ([place]) 04/06/2019  . Diabetic foot ulcer ([place]) 04/06/2019  . Mood disorder ([place]) 04/06/2019  . Aortic atherosclerosis ([place]) 02/13/2017  . Prostate cancer ([place]) 02/13/2017  . Primary malignant neoplasm of prostate metastatic to bone ([place]) 02/13/2017  . Orthostatic hypotension 03/19/2016  . CAD S/P percutaneous coronary angioplasty 02/28/2016  . Atherosclerosis of native coronary artery of native heart with angina pectoris ([place])   . Abnormal nuclear stress test 09/22/2015  . Thrombocytopenia ([place])   . Advanced directives, counseling/discussion 09/27/2014  . PAF (paroxysmal atrial fibrillation) ([place]) 04/12/2014  . History of CVA (cerebrovascular accident) 03/31/2014  . CKD stage 3 due to type 2 diabetes mellitus ([place])   . Uncontrolled type 2 diabetes mellitus with diabetic polyneuropathy, without long-term current use of insulin ([place]) 11/05/2011  . Routine general medical examination at a health care facility 05/07/2011  . Essential hypertension 12/23/2009  . GERD 12/23/2009  . BPH (benign prostatic hyperplasia) 12/23/2009  . OSTEOARTHRITIS 12/23/2009   Recommendations/Plan: - Pain: improved control overnight.  He is  going to try to focus on using oral medication to ensure that it is effective prior to discharge - Goal [place] with [place] support of hospice.  Referral placed to [facility] as patient has relationship with them through outpatient palliative care and requested their services. - I would continue oral potassium supplementation on discharge. - Likely goal to d/c tomorrow.  Code Status:    Code Status Orders  (From admission, onward)         Start     Ordered   08/17/20 2100  Do not attempt resuscitation (DNR)  Continuous       Question Answer Comment  In the event of cardiac or respiratory ARREST Do not call a "code blue"   In the event of cardiac or respiratory ARREST Do not perform Intubation, CPR, defibrillation or ACLS   In the event of cardiac or respiratory ARREST Use medication by any route, position, wound care, and other measures to relive pain and suffering. May use oxygen, suction and manual treatment of airway obstruction as needed for comfort.      08/17/20 2101        Code Status History    Date Active Date Inactive Code Status Order ID Comments User Context   08/17/2020 2027 08/17/2020 2101 DNR [id number]  Etta Quill, DO ED   07/22/2020 1123 07/27/2020 2029 DNR [id number]  Tawni Millers, MD Inpatient   07/17/2020 1639 07/22/2020 1123 Partial Code [id number]  Harold Hedge, MD ED   07/10/2020 1203 07/15/2020 1729 Partial Code [id number]  Knox Royalty, NP Inpatient   07/05/2020 0336 07/10/2020 1203 Full Code [id number]  Rise Patience, MD ED   06/19/2020 0054 06/24/2020 2024 Full Code [id number]  Etta Quill, DO ED   06/09/2020  2111 06/13/2020 1958 Full Code [id number]  Vernelle Emerald, MD ED   06/09/2020 2100 06/09/2020 2111 Full Code [id number]  Vernelle Emerald, MD ED   04/30/2020 2046 05/12/2020 2353 Full Code [id number]  Vernelle Emerald, MD ED   01/21/2017 1120 01/22/2017 1453 Full Code [id number]  Franchot Gallo, MD Inpatient    02/28/2016 1601 03/01/2016 0006 Full Code [id number]  Consuelo Pandy, PA-C ED   10/06/2015 0831 10/07/2015 1619 Full Code [id number]  Martinique, Peter M, MD Inpatient   09/22/2015 1031 09/22/2015 1617 Full Code [id number]  [place], Peter M, MD Inpatient   03/31/2014 2203 04/02/2014 1841 Full Code [id number]  Theressa Millard, MD Inpatient   Advance Care Planning Activity       Prognosis:   < 6 months  Discharge Planning:  [place] with Hospice  Care plan was discussed with patient and family  Thank you for allowing the Palliative Medicine Team to assist in the care of this patient.   Time In: 1330 Time Out: 1410 Total Time 40 Prolonged Time Billed No      Greater than 50%  of this time was spent counseling and coordinating care related to the above assessment and plan.  Micheline Rough, MD  Please contact Palliative Medicine Team phone at [phone] for questions and concerns.

## 2020-08-19 NOTE — Progress Notes (Signed)
Initial Nutrition Assessment  DOCUMENTATION CODES:   Not applicable  INTERVENTION:  - will order Boost Breeze BID, each supplement provides 250 kcal and 9 grams of protein. - will order Ensure Enlive once/day, each supplement provides 350 kcal and 20 grams of protein. - will order Magic Cup with dinner meals, each supplement provides 290 kcal and 9 grams of protein. - will order 1 tablet multivitamin with minerals/day. - complete NFPE at follow-up.  NUTRITION DIAGNOSIS:   Inadequate oral intake related to acute illness, poor appetite as evidenced by per patient/family report, meal completion < 50%.  GOAL:   Patient will meet greater than or equal to 90% of their needs  MONITOR:   PO intake, Supplement acceptance, Labs, Weight trends  REASON FOR ASSESSMENT:   Malnutrition Screening Tool  ASSESSMENT:   86 year old male with medical history of metastatic prostate cancer to bone, adult FTT over the past several months currently on home hospice, HTN, A. fib, CHF, admission in October 2021 for colonic ileus d/t profound hypokalemia which required GI consultation which recommended conservative management and potassium replacement. He presented to the ED on 12/1 due to worsening abdominal pain, N/D. CT abdomen/pelvis showed large stool burden in the colon and he was diagnosed with stercoral colitis and cholelithiasis without cholecystitis.  Diet advanced from NPO to Soft yesterday at 1045 and no intakes documented at that time.  Patient laying in bed with daughter and wife at bedside. Patient states that he has only wanted family members to touch him today although he is willing to have RN put voltaren cream on later as he feels he needs it d/t discomfort.   Patient noted to be a/o x4, but did seem to have some confusion during RD visit, often making statements completely unrelated to the topic or responses being non-congruent with the question asked.   Patient and family report that  he had some grits earlier today, but lunch has gone untouched and patient is not interested, at least at the time of visit, in trying anything. He does request a diet ginger ale which RD provided to him.   He denies any abdominal pain at the time of visit and states that he has been feeling great overall today.   Weight yesterday was 137 lb and weight on 10/2 was 145 lb. This indicates 8 lb weight loss (5.5% body weight) in the past 2 months; not significant for time frame.   He was last seen by a RD on 10/26 at which time he met criteria for severe malnutrition based on NFPE findings. Suspect this is ongoing.   Palliative Care saw patient yesterday and outlined prognosis of likely <6 months.    Labs reviewed; K: 3.3 mmol/l, creatinine: 0.5 mg/dl, Ca: 8 mg/dl, Alk Phos elevated, AST elevated.  Medications reviewed; 40 mEq Klor-Con x2 doses 12/3.  IVF; NS-40 mEq KCl @ 75 ml/hr.     NUTRITION - FOCUSED PHYSICAL EXAM:  unable to complete per patient preference  Diet Order:   Diet Order            DIET SOFT Room service appropriate? Yes; Fluid consistency: Thin  Diet effective now                 EDUCATION NEEDS:   Not appropriate for education at this time  Skin:  Skin Assessment: Skin Integrity Issues: Skin Integrity Issues:: Stage II, DTI DTI: sacrum Stage II: mid back  Last BM:  12/3 (type 6)  Height:   Ht  Readings from Last 1 Encounters:  08/18/20 6' (1.829 m)    Weight:   Wt Readings from Last 1 Encounters:  08/18/20 62.3 kg    Estimated Nutritional Needs:  Kcal:  2050-2250 kcal Protein:  100-115 grams Fluid:  >/= 2.2 L/day      [name], MS, RD, LDN, CNSC Inpatient Clinical Dietitian RD pager # available in [facility]  After hours/weekend pager # available in [facility]

## 2020-08-19 NOTE — Progress Notes (Signed)
Pt had a 10 beat run of V-tach at 1400 this shift. Pt remained asymptomatic throughout. MD notified, potassium PO ordered. Will continue to monitor.

## 2020-08-19 NOTE — NC FL2 (Signed)
[place] LEVEL OF CARE SCREENING TOOL     IDENTIFICATION  Patient Name: Adrian Singh Birthdate: 07-07-1934 Sex: male Admission Date (Current Location): 08/17/2020  [facility] and [place] Number:      Facility and Address:  [facility],  [place] [address] [place]      Provider Number: [id number]  Attending Physician Name and Address:  Aline August, MD  Relative Name and Phone Number:  Morene Rankins dtr [phone]    Current Level of Care: Hospital Recommended Level of Care: Nursing Facility Prior Approval Number:    Date Approved/Denied:   PASRR Number:    Discharge Plan: Other (Comment) (LTC)    Current Diagnoses: Patient Active Problem List   Diagnosis Date Noted  . Abdominal pain 08/18/2020  . Chronic diastolic CHF (congestive heart failure) ([place]) 07/22/2020  . Failure to thrive in adult 07/17/2020  . Back pain 07/17/2020  . Prolonged QT interval 07/17/2020  . Chronic systolic CHF (congestive heart failure) ([place]) 07/17/2020  . Ileus ([place]) 07/05/2020  . Hypokalemia 07/05/2020  . Atrial fibrillation with RVR ([place]) 07/05/2020  . Postural dizziness with presyncope 06/09/2020  . Severe protein-calorie malnutrition ([place]) 05/06/2020  . Palliative care by specialist   . Goals of care, counseling/discussion   . DNR (do not resuscitate) discussion   . Adult failure to thrive   . Need for emotional support   . Pressure ulcer of back 05/04/2020  . Stroke ([place]) 05/02/2020  . General weakness 04/30/2020  . Fall at home, initial encounter 04/30/2020  . Anemia 04/30/2020  . Elevated CK 04/30/2020  . Chronic kidney disease, stage 3a ([place]) 04/30/2020  . Elevated troponin 04/30/2020  . Fatigue 04/29/2020  . Malnutrition of mild degree ([place]) 04/06/2019  . Diabetic foot ulcer ([place]) 04/06/2019  . Mood disorder ([place]) 04/06/2019  . Aortic atherosclerosis ([place]) 02/13/2017  . Prostate cancer ([place]) 02/13/2017  . Primary malignant neoplasm  of prostate metastatic to bone ([place]) 02/13/2017  . Orthostatic hypotension 03/19/2016  . CAD S/P percutaneous coronary angioplasty 02/28/2016  . Atherosclerosis of native coronary artery of native heart with angina pectoris ([place])   . Abnormal nuclear stress test 09/22/2015  . Thrombocytopenia ([place])   . Advanced directives, counseling/discussion 09/27/2014  . PAF (paroxysmal atrial fibrillation) ([place]) 04/12/2014  . History of CVA (cerebrovascular accident) 03/31/2014  . CKD stage 3 due to type 2 diabetes mellitus ([place])   . Uncontrolled type 2 diabetes mellitus with diabetic polyneuropathy, without long-term current use of insulin ([place]) 11/05/2011  . Routine general medical examination at a health care facility 05/07/2011  . Essential hypertension 12/23/2009  . GERD 12/23/2009  . BPH (benign prostatic hyperplasia) 12/23/2009  . OSTEOARTHRITIS 12/23/2009    Orientation RESPIRATION BLADDER Height & Weight     Self  Normal External catheter Weight: 62.3 kg Height:  6' (182.9 cm) (AS per patient)  BEHAVIORAL SYMPTOMS/MOOD NEUROLOGICAL BOWEL NUTRITION STATUS      Incontinent Diet (Soft)  AMBULATORY STATUS COMMUNICATION OF NEEDS Skin   Extensive Assist Verbally PU Stage and Appropriate Care, Skin abrasions (back gen'l wounds;L elbow skin tear.) PU Stage 1 Dressing: Daily                     Personal Care Assistance Level of Assistance  Feeding, Dressing, Bathing Bathing Assistance: Maximum assistance Feeding assistance: Limited assistance Dressing Assistance: Maximum assistance     Functional Limitations Info  Sight, Hearing, Speech Sight Info: Impaired (eyeglasses) Hearing Info: Adequate  Speech Info: Impaired (dentures-uppers/lowers)    SPECIAL CARE FACTORS FREQUENCY                       Contractures Contractures Info: Not present    Additional Factors Info  Code Status, Allergies Code Status Info:  (DNR) Allergies Info: Soma Carisoprodol;Doxazosin            Current Medications (08/19/2020):  This is the current hospital active medication list Current Facility-Administered Medications  Medication Dose Route Frequency Provider Last Rate Last Admin  . 0.9 % NaCl with KCl 40 mEq / L  infusion   Intravenous Continuous Aline August, MD 75 mL/hr at 08/18/20 1836 Rate Verify at 08/18/20 1836  . acetaminophen (TYLENOL) tablet 650 mg  650 mg Oral Q6H PRN Etta Quill, DO       Or  . acetaminophen (TYLENOL) suppository 650 mg  650 mg Rectal Q6H PRN Etta Quill, DO      . diclofenac Sodium (VOLTAREN) 1 % topical gel 2 g  2 g Topical QID Micheline Rough, MD   2 g at 08/19/20 1317  . feeding supplement (BOOST / RESOURCE BREEZE) liquid 1 Container  1 Container Oral BID BM Alekh, Kshitiz, MD      . feeding supplement (ENSURE ENLIVE / ENSURE PLUS) liquid 237 mL  237 mL Oral Q24H Alekh, Kshitiz, MD      . HYDROmorphone (DILAUDID) injection 0.5-1 mg  0.5-1 mg Intravenous Q4H PRN Micheline Rough, MD   1 mg at 08/19/20 0557  . lidocaine (LIDODERM) 5 % 1 patch  1 patch Transdermal Q24H Micheline Rough, MD   1 patch at 08/18/20 2054  . multivitamin with minerals tablet 1 tablet  1 tablet Oral Daily Alekh, Kshitiz, MD      . oxyCODONE (Oxy IR/ROXICODONE) immediate release tablet 5 mg  5 mg Oral Q4H PRN Aline August, MD   5 mg at 08/19/20 1324  . potassium chloride SA (KLOR-CON) CR tablet 40 mEq  40 mEq Oral Q4H Aline August, MD   40 mEq at 08/19/20 1317     Discharge Medications: Please see discharge summary for a list of discharge medications.  Relevant Imaging Results:  Relevant Lab Results:   Additional Information ss#[id number] [place]  Blair Lundeen, Juliann Pulse, [place]

## 2020-08-19 NOTE — Progress Notes (Signed)
Manufacturing engineer [facility])  Received request from [facility] for hospice services at home after discharge.  Chart and pt information under review by [facility] physician.  Hospice eligibility pending at this time.  Hospital liaison spoke with daughter, [name], to initiate education related to hospice philosophy and services and to answer any questions at this time.  [name] verbalized understanding of information given.  Per discussion the plan is to discharge home tomorrow.  Pease send signed and completed DNR home with pt/family.  Please provide prescriptions at discharge as needed to ensure ongoing symptom management.    DME needs discussed. Gel overlay mattress topper to be ordered. Address has been verified and is correct in the chart.  ACC information and contact numbers given to [facility].  Above information shared with Nuala Alpha Manager.  Please call with any questions or concerns.  Thank you for the opportunity to participate in this pt's care.  Domenic Moras, BSN, RN [employer] [phone] (24h on call)

## 2020-08-19 NOTE — TOC Initial Note (Signed)
Transition of Care [facility]) - Initial/Assessment Note    Patient Details  Name: Adrian Singh MRN: [id number] Date of Birth: 11-Feb-1934  Transition of [facility]) CM/SW Contact:    Dessa Phi, RN Phone Number: 08/19/2020, 3:09 PM  Clinical Narrative:From [facility]-LTC w/[facility] for P[facility] to eval for hospice services-Jennifer rep aware.                   Expected Discharge Plan: Long Term Nursing Home Barriers to Discharge: Continued Medical Work up   Patient Goals and CMS Choice Patient states their goals for this hospitalization and ongoing recovery are:: return back to LTC w/hospice services CMS Medicare.gov Compare Post Acute Care list provided to:: Patient Represenative (must comment) Choice offered to / list presented to : Adult Children  Expected Discharge Plan and Services Expected Discharge Plan: [place]   Discharge Planning Services: CM Consult Post Acute Care Choice: Nursing Home Living arrangements for the past 2 months:  (LTC)                                      Prior Living Arrangements/Services Living arrangements for the past 2 months:  (LTC) Lives with:: Facility Resident Patient language and need for interpreter reviewed:: Yes Do you feel safe going back to the place where you live?: Yes      Need for Family Participation in Patient Care: No (Comment) Care giver support system in place?: Yes (comment)   Criminal Activity/Legal Involvement Pertinent to Current Situation/Hospitalization: No - Comment as needed  Activities of Daily Living Home Assistive Devices/Equipment: Cane (specify quad or straight), Walker (specify type), Wheelchair, Dentures (specify type), Bedside commode/3-in-1, Hand-held shower hose, Grab bars around toilet, Shower chair with back, CBG Meter (front wheeled walker, quad cane, manual wheelchair,upper/lower dentures) ADL Screening (condition at time of admission) Patient's cognitive  ability adequate to safely complete daily activities?: Yes Is the patient deaf or have difficulty hearing?: Yes Does the patient have difficulty seeing, even when wearing glasses/contacts?: No Does the patient have difficulty concentrating, remembering, or making decisions?: No Patient able to express need for assistance with ADLs?: Yes Does the patient have difficulty dressing or bathing?: Yes Independently performs ADLs?: No Communication: Independent Dressing (OT): Dependent Is this a change from baseline?: Pre-admission baseline Grooming: Dependent Is this a change from baseline?: Pre-admission baseline Feeding: Dependent Is this a change from baseline?: Pre-admission baseline Bathing: Dependent Is this a change from baseline?: Pre-admission baseline Toileting: Dependent Is this a change from baseline?: Pre-admission baseline In/Out Bed: Dependent Is this a change from baseline?: Pre-admission baseline Walks in Home: Dependent Is this a change from baseline?: Pre-admission baseline Does the patient have difficulty walking or climbing stairs?: Yes (secondary to weakness) Weakness of Legs: Both Weakness of Arms/Hands: Both  Permission Sought/Granted Permission sought to share information with : Case Manager Permission granted to share information with : Yes, Verbal Permission Granted  Share Information with NAME: Case Manager     Permission granted to share info w RelationshipKatharine Look dtr [phone]     Emotional Assessment Appearance:: Appears stated age Attitude/Demeanor/Rapport: Gracious Affect (typically observed): Accepting Orientation: : Oriented to Self, Oriented to  Time, Oriented to Place, Oriented to Situation Alcohol / Substance Use: Not Applicable Psych Involvement: No (comment)  Admission diagnosis:  Hypokalemia [E87.6] Ileus (HCC) [K56.7] Right lower quadrant abdominal pain [R10.31] Abdominal pain [R10.9] Constipation,  unspecified constipation type  [K59.00] Patient Active Problem List   Diagnosis Date Noted  . Abdominal pain 08/18/2020  . Chronic diastolic CHF (congestive heart failure) ([place]) 07/22/2020  . Failure to thrive in adult 07/17/2020  . Back pain 07/17/2020  . Prolonged QT interval 07/17/2020  . Chronic systolic CHF (congestive heart failure) ([place]) 07/17/2020  . Ileus ([place]) 07/05/2020  . Hypokalemia 07/05/2020  . Atrial fibrillation with RVR ([place]) 07/05/2020  . Postural dizziness with presyncope 06/09/2020  . Severe protein-calorie malnutrition ([place]) 05/06/2020  . Palliative care by specialist   . Goals of care, counseling/discussion   . DNR (do not resuscitate) discussion   . Adult failure to thrive   . Need for emotional support   . Pressure ulcer of back 05/04/2020  . Stroke ([place]) 05/02/2020  . General weakness 04/30/2020  . Fall at home, initial encounter 04/30/2020  . Anemia 04/30/2020  . Elevated CK 04/30/2020  . Chronic kidney disease, stage 3a ([place]) 04/30/2020  . Elevated troponin 04/30/2020  . Fatigue 04/29/2020  . Malnutrition of mild degree ([place]) 04/06/2019  . Diabetic foot ulcer ([place]) 04/06/2019  . Mood disorder ([place]) 04/06/2019  . Aortic atherosclerosis ([place]) 02/13/2017  . Prostate cancer ([place]) 02/13/2017  . Primary malignant neoplasm of prostate metastatic to bone ([place]) 02/13/2017  . Orthostatic hypotension 03/19/2016  . CAD S/P percutaneous coronary angioplasty 02/28/2016  . Atherosclerosis of native coronary artery of native heart with angina pectoris ([place])   . Abnormal nuclear stress test 09/22/2015  . Thrombocytopenia ([place])   . Advanced directives, counseling/discussion 09/27/2014  . PAF (paroxysmal atrial fibrillation) ([place]) 04/12/2014  . History of CVA (cerebrovascular accident) 03/31/2014  . CKD stage 3 due to type 2 diabetes mellitus ([place])   . Uncontrolled type 2 diabetes mellitus with diabetic polyneuropathy, without long-term current use of insulin ([place]) 11/05/2011  . Routine general  medical examination at a health care facility 05/07/2011  . Essential hypertension 12/23/2009  . GERD 12/23/2009  . BPH (benign prostatic hyperplasia) 12/23/2009  . OSTEOARTHRITIS 12/23/2009   PCP:  [name], MD Pharmacy:   [id number] [facility], [place] [address] Phone: [phone] Fax: [phone]  [facility] [place], [facility] [address] AT [facility] [place] [address] [place] [address] Phone: [phone] Fax: [phone]     Social Determinants of Health ([place]) Interventions    Readmission Risk Interventions Readmission Risk Prevention Plan 08/19/2020 07/19/2020 06/20/2020  Transportation Screening Complete Complete Complete  Medication Review Press photographer) Complete Complete Complete  PCP or Specialist appointment within 3-5 days of discharge Complete Complete Complete  HRI or Home Care Consult Complete Complete Complete  SW Recovery Care/Counseling Consult Complete Complete Complete  Palliative Care Screening Complete Complete Not Applicable  Skilled Nursing Facility Complete - Complete  Some recent data might be hidden

## 2020-08-19 NOTE — TOC Initial Note (Signed)
Transition of Care [facility]) - Initial/Assessment Note    Patient Details  Name: Adrian Singh MRN: [id number] Date of Birth: Nov 10, 1933  Transition of Care [facility]) CM/SW Contact:    Dessa Phi, RN Phone Number: 08/19/2020, 2:47 PM  Clinical Narrative:  Patient from [facility] w/[facility] for [place]. D/c plan to return back to [place] w/Hospice services-if family agrees, & qualifies per [facility]Spoke to dtr [name] she agrees to discuss hospice services w/[facility] aware & she will f/u.[facility] able to accept back over weekend.                 Expected Discharge Plan: Long Term Nursing Home Barriers to Discharge: Continued Medical Work up   Patient Goals and CMS Choice Patient states their goals for this hospitalization and ongoing recovery are:: return back to LTC w/hospice services CMS Medicare.gov Compare Post Acute Care list provided to:: Patient Represenative (must comment) Choice offered to / list presented to : Adult Children  Expected Discharge Plan and Services Expected Discharge Plan: [place]   Discharge Planning Services: CM Consult Post Acute Care Choice: Nursing Home Living arrangements for the past 2 months:  (LTC)                                      Prior Living Arrangements/Services Living arrangements for the past 2 months:  (LTC) Lives with:: Facility Resident Patient language and need for interpreter reviewed:: Yes Do you feel safe going back to the place where you live?: Yes      Need for Family Participation in Patient Care: No (Comment) Care giver support system in place?: Yes (comment)   Criminal Activity/Legal Involvement Pertinent to Current Situation/Hospitalization: No - Comment as needed  Activities of Daily Living Home Assistive Devices/Equipment: Cane (specify quad or straight), Walker (specify type), Wheelchair, Dentures (specify type), Bedside  commode/3-in-1, Hand-held shower hose, Grab bars around toilet, Shower chair with back, CBG Meter (front wheeled walker, quad cane, manual wheelchair,upper/lower dentures) ADL Screening (condition at time of admission) Patient's cognitive ability adequate to safely complete daily activities?: Yes Is the patient deaf or have difficulty hearing?: Yes Does the patient have difficulty seeing, even when wearing glasses/contacts?: No Does the patient have difficulty concentrating, remembering, or making decisions?: No Patient able to express need for assistance with ADLs?: Yes Does the patient have difficulty dressing or bathing?: Yes Independently performs ADLs?: No Communication: Independent Dressing (OT): Dependent Is this a change from baseline?: Pre-admission baseline Grooming: Dependent Is this a change from baseline?: Pre-admission baseline Feeding: Dependent Is this a change from baseline?: Pre-admission baseline Bathing: Dependent Is this a change from baseline?: Pre-admission baseline Toileting: Dependent Is this a change from baseline?: Pre-admission baseline In/Out Bed: Dependent Is this a change from baseline?: Pre-admission baseline Walks in Home: Dependent Is this a change from baseline?: Pre-admission baseline Does the patient have difficulty walking or climbing stairs?: Yes (secondary to weakness) Weakness of Legs: Both Weakness of Arms/Hands: Both  Permission Sought/Granted Permission sought to share information with : Case Manager Permission granted to share information with : Yes, Verbal Permission Granted  Share Information with NAME: Case Manager     Permission granted to share info w RelationshipKatharine Look dtr [phone]     Emotional Assessment Appearance:: Appears stated age Attitude/Demeanor/Rapport: Gracious Affect (typically observed): Accepting Orientation: : Oriented to Self, Oriented to  Time,  Oriented to Place, Oriented to Situation Alcohol /  Substance Use: Not Applicable Psych Involvement: No (comment)  Admission diagnosis:  Hypokalemia [E87.6] Ileus (HCC) [K56.7] Right lower quadrant abdominal pain [R10.31] Abdominal pain [R10.9] Constipation, unspecified constipation type [K59.00] Patient Active Problem List   Diagnosis Date Noted  . Abdominal pain 08/18/2020  . Chronic diastolic CHF (congestive heart failure) ([place]) 07/22/2020  . Failure to thrive in adult 07/17/2020  . Back pain 07/17/2020  . Prolonged QT interval 07/17/2020  . Chronic systolic CHF (congestive heart failure) ([place]) 07/17/2020  . Ileus ([place]) 07/05/2020  . Hypokalemia 07/05/2020  . Atrial fibrillation with RVR ([place]) 07/05/2020  . Postural dizziness with presyncope 06/09/2020  . Severe protein-calorie malnutrition ([place]) 05/06/2020  . Palliative care by specialist   . Goals of care, counseling/discussion   . DNR (do not resuscitate) discussion   . Adult failure to thrive   . Need for emotional support   . Pressure ulcer of back 05/04/2020  . Stroke ([place]) 05/02/2020  . General weakness 04/30/2020  . Fall at home, initial encounter 04/30/2020  . Anemia 04/30/2020  . Elevated CK 04/30/2020  . Chronic kidney disease, stage 3a ([place]) 04/30/2020  . Elevated troponin 04/30/2020  . Fatigue 04/29/2020  . Malnutrition of mild degree ([place]) 04/06/2019  . Diabetic foot ulcer ([place]) 04/06/2019  . Mood disorder ([place]) 04/06/2019  . Aortic atherosclerosis ([place]) 02/13/2017  . Prostate cancer ([place]) 02/13/2017  . Primary malignant neoplasm of prostate metastatic to bone ([place]) 02/13/2017  . Orthostatic hypotension 03/19/2016  . CAD S/P percutaneous coronary angioplasty 02/28/2016  . Atherosclerosis of native coronary artery of native heart with angina pectoris ([place])   . Abnormal nuclear stress test 09/22/2015  . Thrombocytopenia ([place])   . Advanced directives, counseling/discussion 09/27/2014  . PAF (paroxysmal atrial fibrillation) ([place]) 04/12/2014  . History of  CVA (cerebrovascular accident) 03/31/2014  . CKD stage 3 due to type 2 diabetes mellitus ([place])   . Uncontrolled type 2 diabetes mellitus with diabetic polyneuropathy, without long-term current use of insulin ([place]) 11/05/2011  . Routine general medical examination at a health care facility 05/07/2011  . Essential hypertension 12/23/2009  . GERD 12/23/2009  . BPH (benign prostatic hyperplasia) 12/23/2009  . OSTEOARTHRITIS 12/23/2009   PCP:  [name], MD Pharmacy:   [phone] [address], [place] [address] [place] [address] Phone: [phone] Fax: [phone]  [facility] [address] [place], [place] [facility] AT [facility] [address] [place] [address] Phone: [phone] Fax: [phone]     Social Determinants of Health (SDOH) Interventions    Readmission Risk Interventions Readmission Risk Prevention Plan 07/19/2020 06/20/2020 06/11/2020  Transportation Screening Complete Complete Complete  Medication Review ([place]) Complete Complete Complete  PCP or Specialist appointment within 3-5 days of discharge Complete Complete -  [place] or Home Care Consult Complete Complete -  SW Recovery Care/Counseling Consult Complete Complete Complete  Palliative Care Screening Complete Not Applicable Not [place] - Complete Complete  Some recent data might be hidden

## 2020-08-20 DIAGNOSIS — K567 Ileus, unspecified: Secondary | ICD-10-CM | POA: Diagnosis not present

## 2020-08-20 DIAGNOSIS — C61 Malignant neoplasm of prostate: Secondary | ICD-10-CM | POA: Diagnosis not present

## 2020-08-20 DIAGNOSIS — E876 Hypokalemia: Secondary | ICD-10-CM | POA: Diagnosis not present

## 2020-08-20 DIAGNOSIS — I480 Paroxysmal atrial fibrillation: Secondary | ICD-10-CM | POA: Diagnosis not present

## 2020-08-20 DIAGNOSIS — G893 Neoplasm related pain (acute) (chronic): Secondary | ICD-10-CM

## 2020-08-20 DIAGNOSIS — E43 Unspecified severe protein-calorie malnutrition: Secondary | ICD-10-CM | POA: Diagnosis not present

## 2020-08-20 DIAGNOSIS — Z7189 Other specified counseling: Secondary | ICD-10-CM | POA: Diagnosis not present

## 2020-08-20 DIAGNOSIS — R627 Adult failure to thrive: Secondary | ICD-10-CM | POA: Diagnosis not present

## 2020-08-20 LAB — BASIC METABOLIC PANEL
Anion gap: 10 (ref 5–15)
BUN: 12 mg/dL (ref 8–23)
CO2: 27 mmol/L (ref 22–32)
Calcium: 7.8 mg/dL — ABNORMAL LOW (ref 8.9–10.3)
Chloride: 106 mmol/L (ref 98–111)
Creatinine, Ser: 0.64 mg/dL (ref 0.61–1.24)
GFR, Estimated: >60 mL/min (ref >60)
Glucose, Bld: 194 mg/dL — ABNORMAL HIGH (ref 70–99)
Potassium: 4.4 mmol/L (ref 3.5–5.1)
Sodium: 143 mmol/L (ref 135–145)

## 2020-08-20 LAB — MAGNESIUM: Magnesium: 2.2 mg/dL (ref 1.7–2.4)

## 2020-08-20 MED ORDER — OXYCODONE HCL 5 MG PO TABS: 5.0000 mg | ORAL_TABLET | ORAL | 0 refills | Status: AC | PRN

## 2020-08-20 MED ORDER — DICLOFENAC SODIUM 1 % EX GEL: 2.0000 g | Freq: Four times a day (QID) | CUTANEOUS | 0 refills | Status: AC

## 2020-08-20 MED ORDER — POLYETHYLENE GLYCOL 3350 17 G PO PACK: 17.0000 g | PACK | Freq: Every day | ORAL | Status: AC

## 2020-08-20 NOTE — Progress Notes (Addendum)
AVS Placed in transport packet for [facility] and explained over the phone with the receiving nurse at [facility]. Medications and follow up appointments have been explained with pt's nurse verbalizing understanding.

## 2020-08-20 NOTE — TOC Transition Note (Signed)
Transition of Care [facility]) - CM/SW Discharge Note   Patient Details  Name: Adrian Singh MRN: [id number] Date of Birth: 1934-06-01  Transition of Care [facility]) CM/SW Contact:  Ross Ludwig, LCSW Phone Number: 08/20/2020, 12:38 PM   Clinical Narrative:    Patient is a long term care resident at [facility].  Patient will be returning to SNF with hospice services in place.  Patient to be d/c'ed today to [facility].  Patient and family agreeable to plans will transport via ems RN to call report to [phone].  CSW spoke to patient's daughter [name] and she is aware that patient will be discharging today.  Daughter requested bedside nurse to notify her once EMS arrives.         Final next level of care: Long Term Nursing Home Barriers to Discharge: Barriers Resolved   Patient Goals and CMS Choice Patient states their goals for this hospitalization and ongoing recovery are:: To return back to [facility] SNF CMS Medicare.gov Compare Post Acute Care list provided to:: Patient Represenative (must comment) Choice offered to / list presented to : Adult Children  Discharge Placement   Existing PASRR number confirmed : 08/19/20          Patient [facility] bed at: [facility] Patient to be transferred to facility by: [facility] Name of family member notified: Daughter [name] Patient and family notified of of transfer: 08/20/20  Discharge Plan and Services   Discharge Planning Services: CM Consult Post Acute Care Choice: Nursing Home                               Social Determinants of Health (SDOH) Interventions     Readmission Risk Interventions Readmission Risk Prevention Plan 08/19/2020 07/19/2020 06/20/2020  Transportation Screening Complete Complete Complete  Medication Review Press photographer) Complete Complete Complete  PCP or Specialist appointment within 3-5 days of discharge Complete Complete Complete  HRI or Home Care Consult Complete  Complete Complete  SW Recovery Care/Counseling Consult Complete Complete Complete  Palliative Care Screening Complete Complete Not Applicable  Skilled Nursing Facility Complete - Complete  Some recent data might be hidden

## 2020-08-20 NOTE — Discharge Summary (Signed)
Physician Discharge Summary  Adrian Singh NLG:[id number] DOB: [birth year] DOA: 08/17/2020  PCP: [name], MD  Admit date: 08/17/2020 Discharge date: 08/20/2020  Admitted From: SNF Disposition: SNF with hospice  Recommendations for Outpatient Follow-up:  Follow up with hospice at SNF at earliest [place]: No Equipment/Devices: None  Discharge Condition: Poor CODE STATUS: DNR Diet recommendation: As tolerated  Brief/Interim Summary: 86 year old male with history of metastatic prostate cancer to bone, adult failure to thrive over the past many months currently on home hospice, hypertension, paroxysmal A. fib, chronic diastolic CHF, admission in October 2021 for colonic ileus due to profound hypokalemia which required GI consultation which recommended conservative management and potassium replacement presented on 08/17/2020 with worsening abdominal pain with nausea and diarrhea.  CT of the abdomen and pelvis showed large amount of stool in colon, especially rectum,?  Stercoral colitis along with cholelithiasis without cholecystitis.  GI was consulted and recommended conservative management with daily MiraLAX and potassium replacement along with end-of-life care discussions and palliative care consultation.  Palliative care evaluated the patient and family is agreeable for hospice services in the nursing home.  Overall prognosis is very poor.  His potassium level is now normal.  He will be discharged back to the facility with hospice services once arrangements have been made.   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  Stercoral colitis along with cholelithiasis without cholecystitis. -GI  evaluation appreciated: GI has signed off and recommended conservative management with excessive potassium replacement and continue daily MiraLAX.  GI recommends that patient is not a candidate for any kind of intervention as he is nearing the end of his life. -Continue pain medications as needed.  Hypokalemia -Potassium less than 2 on presentation.    Improved to 4.4 this morning after aggressive replacement.  Paroxysmal A. Fib -Currently rate controlled and in sinus rhythm.  Continue metoprolol.  Severe malnutrition Failure to thrive Metastatic prostate cancer Hypoalbuminemia -Patient is severely malnourished and has worsening failure to thrive secondary to metastatic prostate cancer.  During last admission, oncology had recommended hospice care and that he is not a candidate for further cancer treatments. -His overall condition is worsening.  Adamantly denies any surgical intervention or anything that would help his chronic ileus.  He is probably approaching end-of-life.  -Palliative care evaluation and follow-up appreciated.  Family is agreeable for hospice at the facility.  Anemia of chronic disease -Hemoglobin 7.1 on 08/18/2020.  Did not repeat labs because of above discussion.  Chronic thrombocytopenia -Platelets 65 on 08/18/2020.    Did not repeat any labs afterwards because of above discussion. Discharge Instructions  Discharge Instructions    Amb Referral to Palliative Care   Complete by: As directed    Diet - low sodium heart healthy   Complete by: As directed    Discharge wound care:   Complete by: As directed    Local wound care   Increase activity slowly   Complete by: As directed      Allergies as of 08/20/2020      Reactions   Soma [carisoprodol] Other (See Comments)   "did a number on me"   Doxazosin Other (See Comments)   dizziness      Medication List    STOP taking these  medications   Calcium 600 1500 (600 Ca) MG Tabs tablet Generic drug: calcium  carbonate   Contour Next Test test strip Generic drug: glucose blood   multivitamin with minerals Tabs tablet   traMADol 50 MG tablet Commonly known as: ULTRAM     TAKE these medications   acetaminophen 325 MG tablet Commonly known as: TYLENOL Take 650 mg by mouth every 12 (twelve) hours as needed for mild pain or moderate pain.   ascorbic acid 500 MG tablet Commonly known as: VITAMIN C Take 1,000 mg by mouth daily.   Bayer Microlet Lancets lancets Use to test blood sugar once daily dx: E11.40   CONTOUR NEXT EZ MONITOR w/Device Kit USE TO TEST BLOOD SUGAR ONCE DAILY. Dx Code E11.40   cyanocobalamin 1000 MCG tablet Take 1 tablet (1,000 mcg total) by mouth at bedtime.   diclofenac Sodium 1 % Gel Commonly known as: VOLTAREN Apply 2 g topically 4 (four) times daily.   ergocalciferol 1.25 MG (50000 UT) capsule Commonly known as: VITAMIN D2 Take 50,000 Units by mouth once a week. Saturdays   furosemide 40 MG tablet Commonly known as: LASIX Take 40 mg by mouth daily as needed for fluid.   lidocaine 5 % Commonly known as: LIDODERM Place 1 patch onto the skin daily. Remove & Discard patch within 12 hours or as directed by MD   metoprolol tartrate 50 MG tablet Commonly known as: Lopressor Take 1 tablet (50 mg total) by mouth in the morning, at noon, and at bedtime.   nitroGLYCERIN 0.4 MG SL tablet Commonly known as: NITROSTAT Place 1 tablet (0.4 mg total) under the tongue every 5 (five) minutes as needed for chest pain. What changed: additional instructions   oxyCODONE 5 MG immediate release tablet Commonly known as: Oxy IR/ROXICODONE Take 1 tablet (5 mg total) by mouth every 3 (three) hours as needed for moderate pain.   polyethylene glycol 17 g packet Commonly known as: MIRALAX / GLYCOLAX Take 17 g by mouth daily. What changed:   when to take this  reasons to take this   VITAMIN B COMPLEX PO Take 1 tablet by mouth daily.            Discharge Care  Instructions  (From admission, onward)         Start     Ordered   08/20/20 0000  Discharge wound care:       Comments: Local wound care   08/20/20 0856          Allergies  Allergen Reactions  . Soma [Carisoprodol] Other (See Comments)    "did a number on me"  . Doxazosin Other (See Comments)    dizziness    Consultations:  GI/palliative care   Procedures/Studies: CT Abdomen Pelvis W Contrast  Result Date: 08/17/2020 CLINICAL DATA:  Cholelithiasis, right lower quadrant pain radiating to right inguinal region, nausea/vomiting/diarrhea EXAM: CT ABDOMEN AND PELVIS WITH CONTRAST TECHNIQUE: Multidetector CT imaging of the abdomen and pelvis was performed using the standard protocol following bolus administration of intravenous contrast. CONTRAST:  138m OMNIPAQUE IOHEXOL 300 MG/ML  SOLN COMPARISON:  07/05/2020 FINDINGS: Lower chest: There are small bilateral pleural effusions. Trace dependent lower lobe atelectasis. Small hiatal hernia. Hepatobiliary: Cholelithiasis identified without cholecystitis. Liver is unremarkable. Pancreas: There is diffuse pancreatic atrophy, stable. Spleen: Normal in size without focal abnormality. Adrenals/Urinary Tract: There is bilateral renal cortical atrophy and scarring, stable. No renal calculi or obstructive uropathy. Bladder is moderately distended. High attenuation sediment layers dependently within  the right lateral aspect of the bladder. No bladder wall thickening. The adrenals are unremarkable. Stomach/Bowel: There is no bowel obstruction or ileus. There is a large amount of stool throughout the colon, most pronounced within the rectal vault. Distal colonic wall thickening at the rectosigmoid colon consistent with an element of stercoral colitis. There is a normal appendix in the right lower quadrant. Vascular/Lymphatic: Aortic atherosclerosis. No enlarged abdominal or pelvic lymph nodes. Reproductive: Prostate is unremarkable. Other: No free fluid or  free gas. No abdominal wall hernia. Musculoskeletal: Multifocal sclerotic lesions are seen throughout the thoracic cage, thoracolumbar spine, bony pelvis. No acute displaced fracture. Reconstructed images demonstrate no additional findings. IMPRESSION: 1. Large amount of stool throughout the colon, most pronounced within the rectal vault. Distal colonic wall thickening at the rectosigmoid colon consistent with an element of stercoral colitis. 2. Cholelithiasis without cholecystitis. 3. Small bilateral pleural effusions. 4. Stable diffuse bony metastases. 5. Aortic Atherosclerosis (ICD10-I70.0). Electronically Signed   By: Randa Ngo M.D.   On: 08/17/2020 19:43   DG Chest Port 1 View  Result Date: 08/17/2020 CLINICAL DATA:  Pt diagnosed with gallstones on Friday. Increased RLQ pain radiating to groin with N/V/D. EXAM: PORTABLE CHEST 1 VIEW COMPARISON:  07/17/2020 FINDINGS: Cardiac silhouette is normal in size. No mediastinal or hilar masses. Clear lungs.  No convincing pleural effusion and no pneumothorax. Skeletal structures are grossly intact. IMPRESSION: No active disease. Electronically Signed   By: Lajean Manes M.D.   On: 08/17/2020 17:30       Subjective: Patient seen and examined at bedside.  Poor historian.  Sleepy, wakes up slightly, hardly answers any questions.  Confused.  Looks calm.  No overnight fever or vomiting reported.  Discharge Exam: Vitals:   08/19/20 1426 08/19/20 2141  BP: (!) 151/67 123/63  Pulse: 80 78  Resp: 18   Temp: 98.4 F (36.9 C)   SpO2: 96% 90%    General: Pt is Sleepy, wakes up slightly, hardly answers any questions.  Confused.  Very poor historian Cardiovascular: rate controlled, S1/S2 + Respiratory: bilateral decreased breath sounds at bases with scattered crackles Abdominal: Soft, mildly tender in the lower quadrant, slightly distended, bowel sounds + Extremities: Trace lower extremity edema; no cyanosis    The results of significant  diagnostics from this hospitalization (including imaging, microbiology, ancillary and laboratory) are listed below for reference.     Microbiology: Recent Results (from the past 240 hour(s))  Culture, blood (single)     Status: None (Preliminary result)   Collection Time: 08/17/20  5:44 PM   Specimen: BLOOD  Result Value Ref Range Status   Specimen Description   Final    BLOOD LEFT ANTECUBITAL Performed at [facility] [address], [place], [place] [address]    Special Requests   Final    BOTTLES DRAWN AEROBIC AND ANAEROBIC Blood Culture adequate volume Performed at [facility] [address] [place], [place] [address]    Culture   Final    NO GROWTH 2 DAYS Performed at [facility] [address] [place], [place] [address]    Report Status PENDING  Incomplete  Resp Panel by RT-PCR (Flu A&B, Covid) Nasopharyngeal Swab     Status: None   Collection Time: 08/17/20  5:54 PM   Specimen: Nasopharyngeal Swab; Nasopharyngeal(NP) swabs in vial transport medium  Result Value Ref Range Status   SARS Coronavirus 2 by RT PCR NEGATIVE NEGATIVE Final    Comment: (NOTE) SARS-CoV-2  target nucleic acids are NOT DETECTED.  The SARS-CoV-2 RNA is generally detectable in upper respiratory specimens during the acute phase of infection. The lowest concentration of SARS-CoV-2 viral copies this assay can detect is 138 copies/mL. A negative result does not preclude SARS-Cov-2 infection and should not be used as the sole basis for treatment or other patient management decisions. A negative result may occur with  improper specimen collection/handling, submission of specimen other than nasopharyngeal swab, presence of viral mutation(s) within the areas targeted by this assay, and inadequate number of viral copies(<138 copies/mL). A negative result must be combined with clinical observations, patient history, and epidemiological information. The  expected result is Negative.  Fact Sheet for Patients:  EntrepreneurPulse.com.au  Fact Sheet for Healthcare Providers:  IncredibleEmployment.be  This test is no t yet approved or cleared by the [place] FDA and  has been authorized for detection and/or diagnosis of SARS-CoV-2 by FDA under an Emergency Use Authorization (EUA). This EUA will remain  in effect (meaning this test can be used) for the duration of the COVID-19 declaration under Section 564(b)(1) of the Act, 21 U.S.C.section 360bbb-3(b)(1), unless the authorization is terminated  or revoked sooner.       Influenza A by PCR NEGATIVE NEGATIVE Final   Influenza B by PCR NEGATIVE NEGATIVE Final    Comment: (NOTE) The Xpert Xpress SARS-CoV-2/FLU/RSV plus assay is intended as an aid in the diagnosis of influenza from Nasopharyngeal swab specimens and should not be used as a sole basis for treatment. Nasal washings and aspirates are unacceptable for Xpert Xpress SARS-CoV-2/FLU/RSV testing.  Fact Sheet for Patients: EntrepreneurPulse.com.au  Fact Sheet for Healthcare Providers: IncredibleEmployment.be  This test is not yet approved or cleared by the [place] FDA and has been authorized for detection and/or diagnosis of SARS-CoV-2 by FDA under an Emergency Use Authorization (EUA). This EUA will remain in effect (meaning this test can be used) for the duration of the COVID-19 declaration under Section 564(b)(1) of the Act, 21 U.S.C. section 360bbb-3(b)(1), unless the authorization is terminated or revoked.  Performed at [facility], [place] [address] [place], [place] [address]      Labs: BNP (last 3 results) Recent Labs    07/17/20 1355  BNP 8,887.5*   Basic Metabolic Panel: Recent Labs  Lab 08/17/20 1744 08/17/20 2149 08/18/20 0400 08/19/20 0959 08/20/20 0655  NA 142  --  141 145 143  K <2.0*  --  2.8* 3.3*  4.4  CL 100  --  102 106 106  CO2 29  --  '26 27 27  ' GLUCOSE 153*  --  139* 158* 194*  BUN 15  --  '13 10 12  ' CREATININE 0.62  --  0.66 0.50* 0.64  CALCIUM 7.8*  --  7.6* 8.0* 7.8*  MG  --  3.1* 2.4 2.1 2.2  PHOS  --  2.9  --   --   --    Liver Function Tests: Recent Labs  Lab 08/17/20 1744  AST 84*  ALT 11  ALKPHOS 693*  BILITOT 0.7  PROT 6.2*  ALBUMIN 2.0*   No results for input(s): LIPASE, AMYLASE in the last 168 hours. No results for input(s): AMMONIA in the last 168 hours. CBC: Recent Labs  Lab 08/17/20 1744 08/18/20 0400  WBC 6.8 7.3  NEUTROABS 4.4  --   HGB 7.4* 7.1*  HCT 24.4* 23.6*  MCV 83.6 83.7  PLT 78* 65*   Cardiac Enzymes: No results for input(s): CKTOTAL, CKMB,  CKMBINDEX, TROPONINI in the last 168 hours. BNP: Invalid input(s): POCBNP CBG: No results for input(s): GLUCAP in the last 168 hours. D-Dimer No results for input(s): DDIMER in the last 72 hours. Hgb A1c No results for input(s): HGBA1C in the last 72 hours. Lipid Profile No results for input(s): CHOL, HDL, LDLCALC, TRIG, CHOLHDL, LDLDIRECT in the last 72 hours. Thyroid function studies No results for input(s): TSH, T4TOTAL, T3FREE, THYROIDAB in the last 72 hours.  Invalid input(s): FREET3 Anemia work up No results for input(s): VITAMINB12, FOLATE, FERRITIN, TIBC, IRON, RETICCTPCT in the last 72 hours. Urinalysis    Component Value Date/Time   COLORURINE YELLOW 08/18/2020 0030   APPEARANCEUR CLEAR 08/18/2020 0030   LABSPEC 1.034 (H) 08/18/2020 0030   PHURINE 7.0 08/18/2020 0030   GLUCOSEU NEGATIVE 08/18/2020 0030   HGBUR NEGATIVE 08/18/2020 0030   BILIRUBINUR NEGATIVE 08/18/2020 0030   BILIRUBINUR negative 03/18/2014 1014   KETONESUR 5 (A) 08/18/2020 0030   PROTEINUR NEGATIVE 08/18/2020 0030   UROBILINOGEN 1.0 03/31/2014 1228   NITRITE NEGATIVE 08/18/2020 0030   LEUKOCYTESUR NEGATIVE 08/18/2020 0030   Sepsis Labs Invalid input(s): PROCALCITONIN,  WBC,   LACTICIDVEN Microbiology Recent Results (from the past 240 hour(s))  Culture, blood (single)     Status: None (Preliminary result)   Collection Time: 08/17/20  5:44 PM   Specimen: BLOOD  Result Value Ref Range Status   Specimen Description   Final    BLOOD LEFT ANTECUBITAL Performed at [facility], [place] [address], [place], [place] [address]    Special Requests   Final    BOTTLES DRAWN AEROBIC AND ANAEROBIC Blood Culture adequate volume Performed at [facility] [address], [place], [place] [address]    Culture   Final    NO GROWTH 2 DAYS Performed at [facility] [address] [place], [place] [address]    Report Status PENDING  Incomplete  Resp Panel by RT-PCR (Flu A&B, Covid) Nasopharyngeal Swab     Status: None   Collection Time: 08/17/20  5:54 PM   Specimen: Nasopharyngeal Swab; Nasopharyngeal(NP) swabs in vial transport medium  Result Value Ref Range Status   SARS Coronavirus 2 by RT PCR NEGATIVE NEGATIVE Final    Comment: (NOTE) SARS-CoV-2 target nucleic acids are NOT DETECTED.  The SARS-CoV-2 RNA is generally detectable in upper respiratory specimens during the acute phase of infection. The lowest concentration of SARS-CoV-2 viral copies this assay can detect is 138 copies/mL. A negative result does not preclude SARS-Cov-2 infection and should not be used as the sole basis for treatment or other patient management decisions. A negative result may occur with  improper specimen collection/handling, submission of specimen other than nasopharyngeal swab, presence of viral mutation(s) within the areas targeted by this assay, and inadequate number of viral copies(<138 copies/mL). A negative result must be combined with clinical observations, patient history, and epidemiological information. The expected result is Negative.  Fact Sheet for Patients:  EntrepreneurPulse.com.au  Fact Sheet for Healthcare  Providers:  IncredibleEmployment.be  This test is no t yet approved or cleared by the [place] FDA and  has been authorized for detection and/or diagnosis of SARS-CoV-2 by FDA under an Emergency Use Authorization (EUA). This EUA will remain  in effect (meaning this test can be used) for the duration of the COVID-19 declaration under Section 564(b)(1) of the Act, 21 U.S.C.section 360bbb-3(b)(1), unless the authorization is terminated  or revoked sooner.       Influenza A  by PCR NEGATIVE NEGATIVE Final   Influenza B by PCR NEGATIVE NEGATIVE Final    Comment: (NOTE) The Xpert Xpress SARS-CoV-2/FLU/RSV plus assay is intended as an aid in the diagnosis of influenza from Nasopharyngeal swab specimens and should not be used as a sole basis for treatment. Nasal washings and aspirates are unacceptable for Xpert Xpress SARS-CoV-2/FLU/RSV testing.  Fact Sheet for Patients: EntrepreneurPulse.com.au  Fact Sheet for Healthcare Providers: IncredibleEmployment.be  This test is not yet approved or cleared by the [place] FDA and has been authorized for detection and/or diagnosis of SARS-CoV-2 by FDA under an Emergency Use Authorization (EUA). This EUA will remain in effect (meaning this test can be used) for the duration of the COVID-19 declaration under Section 564(b)(1) of the Act, 21 U.S.C. section 360bbb-3(b)(1), unless the authorization is terminated or revoked.  Performed at [facility], [place] [address] [place], [place] [address]      Time coordinating discharge: 35 minutes  SIGNED:   Aline August, MD  [facility] 08/20/2020, 8:56 AM

## 2020-08-20 NOTE — Progress Notes (Signed)
Daily Progress Note   Patient Name: Adrian Singh       Date: 08/20/2020 DOB: 1934/05/15  Age: 86 y.o. MRN#: [id number] Attending Physician: No att. providers found Primary Care Physician: [name], MD Admit Date: 08/17/2020  Reason for Consultation/Follow-up: Establishing goals of care  Subjective: I saw and examined Mr. Wellen today.  He was lying in bed in no distress.  He reports his pain is currently well controlled.  He tells me he is looking forward to transitioning back to [facility].  I received a call from his daughters asking about care plans they know where to have family come and visit with him.  I called and discussed with [name] regarding plan transition back to [facility] today.  She expressed appreciation for the care he has received care at the hospital.  Length of Stay: 2  Current Medications: Scheduled Meds:  . diclofenac Sodium  2 g Topical QID  . feeding supplement  1 Container Oral BID BM  . feeding supplement  237 mL Oral Q24H  . lidocaine  1 patch Transdermal Q24H  . multivitamin with minerals  1 tablet Oral Daily  . polyethylene glycol  17 g Oral Daily    Continuous Infusions: . 0.9 % NaCl with KCl 40 mEq / L Stopped (08/19/20 1539)    PRN Meds: acetaminophen **OR** acetaminophen, HYDROmorphone (DILAUDID) injection, oxyCODONE  Physical Exam        General: Alert, awake, in no acute distress.  HEENT: No bruits, no goiter, no JVD Heart: Regular rate and rhythm. Lungs: Good air movement  Ext: No significant edema Skin: Warm and dry Neuro: Grossly intact, nonfocal.  Vital Signs: BP (!) 146/56 (BP Location: Right Arm)   Pulse 87   Temp 98 F (36.7 C)   Resp 14   Ht 6' (1.829 m) Comment: AS per patient  Wt 62.3 kg   SpO2  98%   BMI 18.63 kg/m  SpO2: SpO2: 98 % O2 Device: O2 Device: Room Air O2 Flow Rate: O2 Flow Rate (L/min): 2 L/min  Intake/output summary:   Intake/Output Summary (Last 24 hours) at 08/20/2020 1821 Last data filed at 08/20/2020 1357 Gross per 24 hour  Intake 515.22 ml  Output --  Net 515.22 ml   LBM: Last BM Date: 08/19/20 Baseline Weight: Weight: 59  kg Most recent weight: Weight: 62.3 kg       Palliative Assessment/Data:    Flowsheet Rows     Most Recent Value  Intake Tab  Referral Department Hospitalist  Unit at Time of Referral ER  Palliative Care Primary Diagnosis Cancer  Date Notified 08/18/20  Palliative Care Type Return patient Palliative Care  Reason for referral Clarify Goals of Care, Pain  Date of Admission 08/17/20  Date first seen by Palliative Care 08/18/20  # of days Palliative referral response time 0 Day(s)  # of days IP prior to Palliative referral 1  Clinical Assessment  Palliative Performance Scale Score 30%  Psychosocial & Spiritual Assessment  Palliative Care Outcomes  Patient/Family meeting held? Yes  Who was at the meeting? Patient, 2 daughters      Patient Active Problem List   Diagnosis Date Noted  . Abdominal pain 08/18/2020  . Chronic diastolic CHF (congestive heart failure) ([place]) 07/22/2020  . Failure to thrive in adult 07/17/2020  . Back pain 07/17/2020  . Prolonged QT interval 07/17/2020  . Chronic systolic CHF (congestive heart failure) ([place]) 07/17/2020  . Ileus ([place]) 07/05/2020  . Hypokalemia 07/05/2020  . Atrial fibrillation with RVR ([place]) 07/05/2020  . Postural dizziness with presyncope 06/09/2020  . Severe protein-calorie malnutrition ([place]) 05/06/2020  . Palliative care by specialist   . Goals of care, counseling/discussion   . DNR (do not resuscitate) discussion   . Adult failure to thrive   . Need for emotional support   . Pressure ulcer of back 05/04/2020  . Stroke ([place]) 05/02/2020  . General weakness 04/30/2020  .  Fall at home, initial encounter 04/30/2020  . Anemia 04/30/2020  . Elevated CK 04/30/2020  . Chronic kidney disease, stage 3a ([place]) 04/30/2020  . Elevated troponin 04/30/2020  . Fatigue 04/29/2020  . Malnutrition of mild degree ([place]) 04/06/2019  . Diabetic foot ulcer ([place]) 04/06/2019  . Mood disorder ([place]) 04/06/2019  . Aortic atherosclerosis ([place]) 02/13/2017  . Prostate cancer ([place]) 02/13/2017  . Primary malignant neoplasm of prostate metastatic to bone ([place]) 02/13/2017  . Orthostatic hypotension 03/19/2016  . CAD S/P percutaneous coronary angioplasty 02/28/2016  . Atherosclerosis of native coronary artery of native heart with angina pectoris ([place])   . Abnormal nuclear stress test 09/22/2015  . Thrombocytopenia ([place])   . Advanced directives, counseling/discussion 09/27/2014  . PAF (paroxysmal atrial fibrillation) ([place]) 04/12/2014  . History of CVA (cerebrovascular accident) 03/31/2014  . CKD stage 3 due to type 2 diabetes mellitus ([place])   . Uncontrolled type 2 diabetes mellitus with diabetic polyneuropathy, without long-term current use of insulin ([place]) 11/05/2011  . Routine general medical examination at a health care facility 05/07/2011  . Essential hypertension 12/23/2009  . GERD 12/23/2009  . BPH (benign prostatic hyperplasia) 12/23/2009  . OSTEOARTHRITIS 12/23/2009    Palliative Care Assessment & Plan   Patient Profile: 86 y.o. male  with past medical history of metastatic prostate CA to bone, adult failure to thrive, HTN, PAF, CHF, ileus admitted on 08/17/2020 with abdominal pain, diarrhea, and ileus.  Palliative consulted for goals of care.    Assessment: Patient Active Problem List   Diagnosis Date Noted  . Abdominal pain 08/18/2020  . Chronic diastolic CHF (congestive heart failure) ([place]) 07/22/2020  . Failure to thrive in adult 07/17/2020  . Back pain 07/17/2020  . Prolonged QT interval 07/17/2020  . Chronic systolic CHF (congestive heart failure) ([place])  07/17/2020  . Ileus ([place]) 07/05/2020  . Hypokalemia 07/05/2020  .  Atrial fibrillation with RVR ([place]) 07/05/2020  . Postural dizziness with presyncope 06/09/2020  . Severe protein-calorie malnutrition ([place]) 05/06/2020  . Palliative care by specialist   . Goals of care, counseling/discussion   . DNR (do not resuscitate) discussion   . Adult failure to thrive   . Need for emotional support   . Pressure ulcer of back 05/04/2020  . Stroke ([place]) 05/02/2020  . General weakness 04/30/2020  . Fall at home, initial encounter 04/30/2020  . Anemia 04/30/2020  . Elevated CK 04/30/2020  . Chronic kidney disease, stage 3a ([place]) 04/30/2020  . Elevated troponin 04/30/2020  . Fatigue 04/29/2020  . Malnutrition of mild degree ([place]) 04/06/2019  . Diabetic foot ulcer ([place]) 04/06/2019  . Mood disorder ([place]) 04/06/2019  . Aortic atherosclerosis ([place]) 02/13/2017  . Prostate cancer ([place]) 02/13/2017  . Primary malignant neoplasm of prostate metastatic to bone ([place]) 02/13/2017  . Orthostatic hypotension 03/19/2016  . CAD S/P percutaneous coronary angioplasty 02/28/2016  . Atherosclerosis of native coronary artery of native heart with angina pectoris ([place])   . Abnormal nuclear stress test 09/22/2015  . Thrombocytopenia ([place])   . Advanced directives, counseling/discussion 09/27/2014  . PAF (paroxysmal atrial fibrillation) ([place]) 04/12/2014  . History of CVA (cerebrovascular accident) 03/31/2014  . CKD stage 3 due to type 2 diabetes mellitus ([place])   . Uncontrolled type 2 diabetes mellitus with diabetic polyneuropathy, without long-term current use of insulin ([place]) 11/05/2011  . Routine general medical examination at a health care facility 05/07/2011  . Essential hypertension 12/23/2009  . GERD 12/23/2009  . BPH (benign prostatic hyperplasia) 12/23/2009  . OSTEOARTHRITIS 12/23/2009   Recommendations/Plan: - Pain: improved with current regimen per his report.  Continue same on discharge.  Hospice will  continue to manage and titrate as necessary to ensure his comfort. - I would continue oral potassium supplementation on discharge. -Back to [facility] today.  Hospice liaison is aware of impending discharge.  Code Status:    Code Status Orders  (From admission, onward)         Start     Ordered   08/17/20 2100  Do not attempt resuscitation (DNR)  Continuous       Question Answer Comment  In the event of cardiac or respiratory ARREST Do not call a "code blue"   In the event of cardiac or respiratory ARREST Do not perform Intubation, CPR, defibrillation or ACLS   In the event of cardiac or respiratory ARREST Use medication by any route, position, wound care, and other measures to relive pain and suffering. May use oxygen, suction and manual treatment of airway obstruction as needed for comfort.      08/17/20 2101        Code Status History    Date Active Date Inactive Code Status Order ID Comments User Context   08/17/2020 2027 08/17/2020 2101 DNR [id number]  Etta Quill, DO ED   07/22/2020 1123 07/27/2020 2029 DNR [id number]  Tawni Millers, MD Inpatient   07/17/2020 1639 07/22/2020 1123 Partial Code [id number]  Harold Hedge, MD ED   07/10/2020 1203 07/15/2020 1729 Partial Code [id number]  Knox Royalty, NP Inpatient   07/05/2020 0336 07/10/2020 1203 Full Code [id number]  Rise Patience, MD ED   06/19/2020 0054 06/24/2020 2024 Full Code [id number]  Etta Quill, DO ED   06/09/2020 2111 06/13/2020 1958 Full Code [id number]  Vernelle Emerald, MD ED   06/09/2020 2100 06/09/2020 2111 Full Code [id number]  Shalhoub,  Sherryll Burger, MD ED   04/30/2020 2046 05/12/2020 2353 Full Code [id number]  Vernelle Emerald, MD ED   01/21/2017 1120 01/22/2017 1453 Full Code [id number]  Franchot Gallo, MD Inpatient   02/28/2016 1601 03/01/2016 0006 Full Code [id number]  Consuelo Pandy, PA-C ED   10/06/2015 0831 10/07/2015 1619 Full Code [id number]  Martinique, Peter M, MD Inpatient     09/22/2015 1031 09/22/2015 1617 Full Code [id number]  [place], Peter M, MD Inpatient   03/31/2014 2203 04/02/2014 1841 Full Code [id number]  Theressa Millard, MD Inpatient   Advance Care Planning Activity       Prognosis:   < 6 months  Discharge Planning:  [place] with Hospice  Care plan was discussed with patient and family  Thank you for allowing the Palliative Medicine Team to assist in the care of this patient.   Time In: 1100 Time Out: 1120 Total Time 20 Prolonged Time Billed No   Greater than 50%  of this time was spent counseling and coordinating care related to the above assessment and plan.Micheline Rough, MD  Please contact Palliative Medicine Team phone at [phone] for questions and concerns.

## 2020-08-22 LAB — CULTURE, BLOOD (SINGLE)
Culture: NO GROWTH
Special Requests: ADEQUATE

## 2020-08-23 DIAGNOSIS — R100 Acute abdomen: Secondary | ICD-10-CM | POA: Diagnosis not present

## 2020-08-23 DIAGNOSIS — R079 Chest pain, unspecified: Secondary | ICD-10-CM | POA: Diagnosis not present

## 2020-08-23 DIAGNOSIS — K8020 Calculus of gallbladder without cholecystitis without obstruction: Secondary | ICD-10-CM | POA: Diagnosis not present

## 2020-08-30 ENCOUNTER — Inpatient Hospital Stay: Payer: Medicare Other | Attending: Internal Medicine

## 2020-09-02 ENCOUNTER — Telehealth: Payer: Self-pay | Admitting: Internal Medicine

## 2020-09-02 NOTE — Telephone Encounter (Signed)
Patient's daughter called to let us know that her dad passed on 2020-09-23 wasn't sure if you were aware of it or not. Patients daughter wanted to thank you for everything you have done for her dad over the years and how grateful they were for you. EM

## 2020-09-04 NOTE — Telephone Encounter (Signed)
That was certainly expected---but I hadn't heard. He must have been back at the SNF and I didn't see any obituary

## 2020-09-13 ENCOUNTER — Telehealth: Payer: Medicare Other

## 2020-09-17 DIAGNOSIS — 419620001 Death: Secondary | SNOMED CT | POA: Diagnosis not present

## 2020-09-17 DEATH — deceased

## 2020-10-18 ENCOUNTER — Encounter: Payer: Medicare Other | Admitting: Internal Medicine

## 2021-03-28 ENCOUNTER — Telehealth: Payer: Self-pay

## 2021-03-28 NOTE — Telephone Encounter (Signed)
Received a call from patient's wife calling to let Dr.Jordan know husband passed away this past 2020/09/07.Stated she keeps getting a bill for a copay from 12/2019.Stated she has tried  multiple times to call [facility] billing and cannot get through.Advised I will speak to billing and let them know.Advised to bring proof of death to enter in husband's chart.Sympathy expressed.

## 2021-05-12 IMAGING — MR MR FOOT*R* W/O CM
7 series · 40 of 40 positions shown · non-contrast
Comparison: Right foot x-rays dated June 26, 2019.

CLINICAL DATA: Chronic diabetic ulcer at the plantar aspect of the
third toe.

EXAM:
MRI OF THE RIGHT FOREFOOT WITHOUT CONTRAST
TECHNIQUE: Multiplanar, multisequence MR imaging of the right forefoot was
performed. No intravenous contrast was administered.

[Series 4: T1 · coronal · right · 3.0mm · 0.38mm/px · 9 of 47 slices shown (1 of 2)]
[im 1/47]
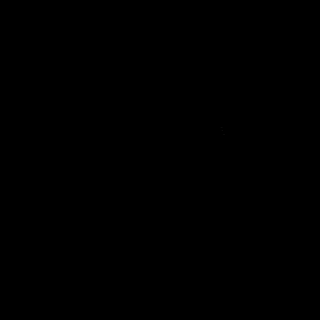
[im 6/47]
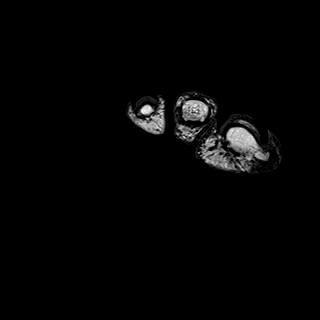
[im 12/47]
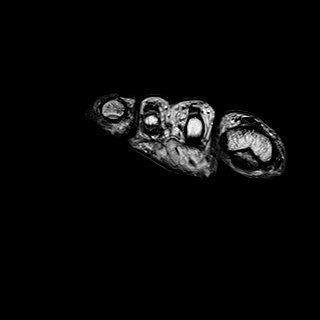
[im 18/47]
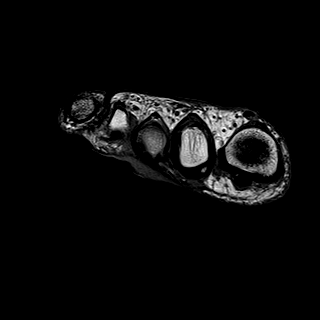
[im 24/47]
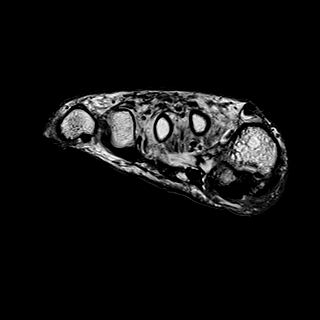
[im 29/47]
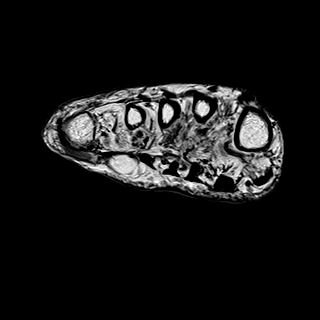
[im 35/47]
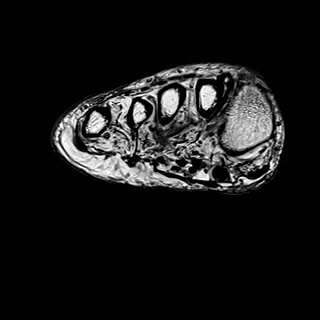
[im 41/47]
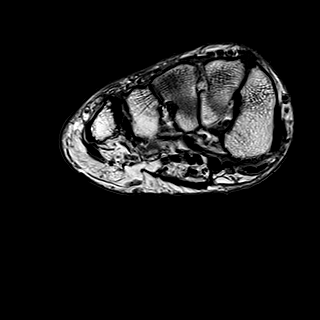
[im 47/47]
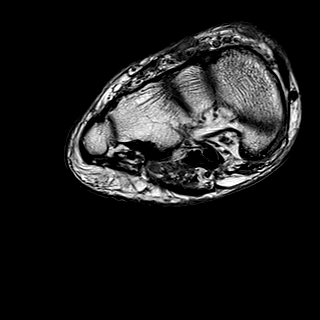

[Series 5: T2 · coronal · right · 3.0mm · 0.50mm/px · 9 of 47 slices shown (1 of 4)]
[im 1/47]
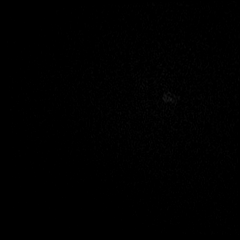
[im 6/47]
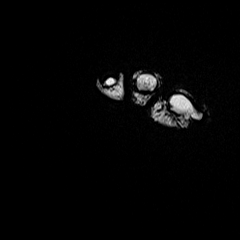
[im 12/47]
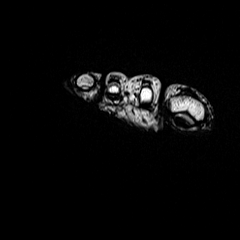
[im 18/47]
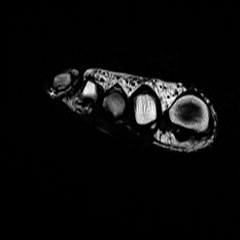
[im 24/47]
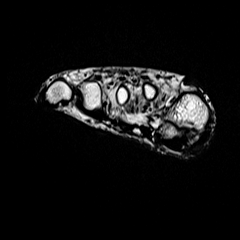
[im 29/47]
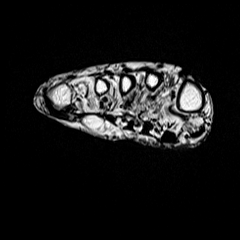
[im 35/47]
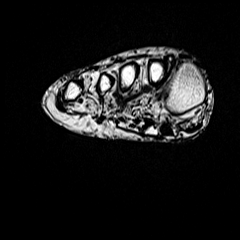
[im 41/47]
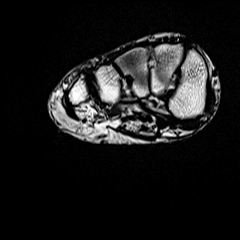
[im 47/47]
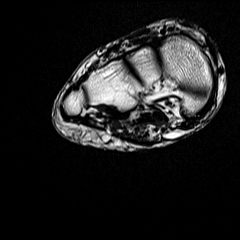

[Series 6: T2 · coronal · right · 3.0mm · 0.50mm/px · 8 of 47 slices shown (2 of 4)]
[im 1/47]
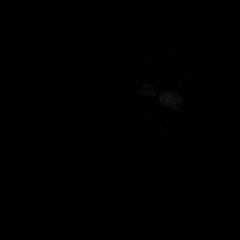
[im 7/47]
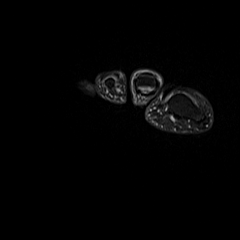
[im 14/47]
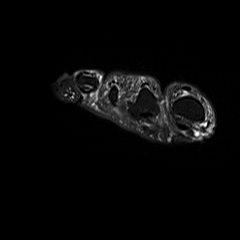
[im 20/47]
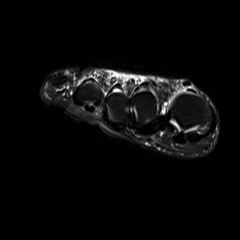
[im 27/47]
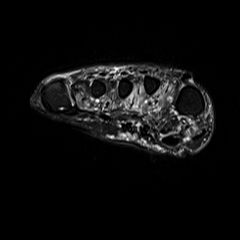
[im 33/47]
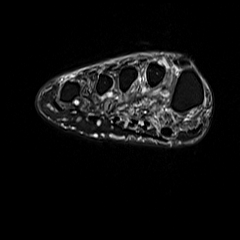
[im 40/47]
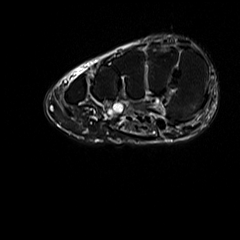
[im 47/47]
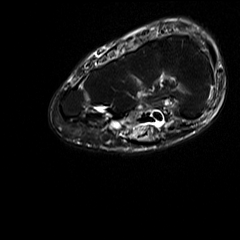

[Series 7: T1 · oblique · right · 3.0mm · 0.70mm/px · 3 of 19 slices shown (2 of 2)]
[im 1/19]
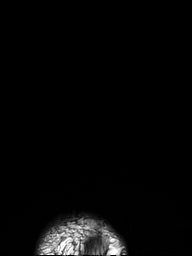
[im 10/19]
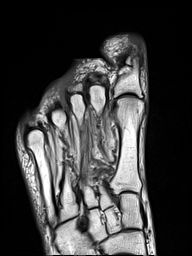
[im 19/19]
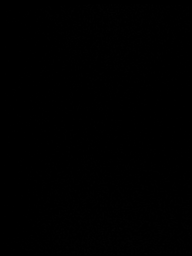

[Series 8: T2 · oblique · right · 3.0mm · 0.70mm/px · 3 of 18 slices shown (3 of 4)]
[im 1/18]
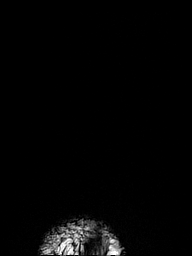
[im 9/18]
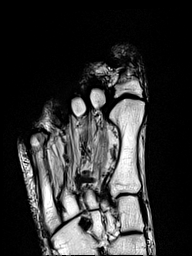
[im 18/18]
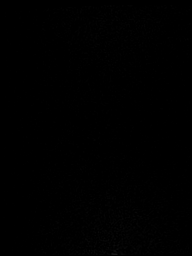

[Series 9: T2 · oblique · right · 3.0mm · 0.70mm/px · 3 of 18 slices shown (4 of 4)]
[im 1/18]
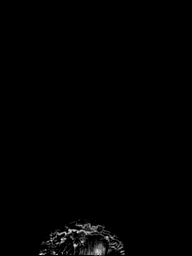
[im 9/18]
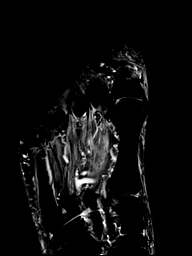
[im 18/18]
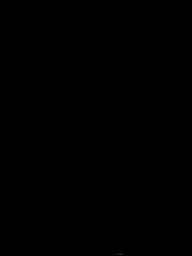

[Series 10: STIR · sagittal · right · 3.0mm · 0.62mm/px · 5 of 27 slices shown]
[im 1/27]
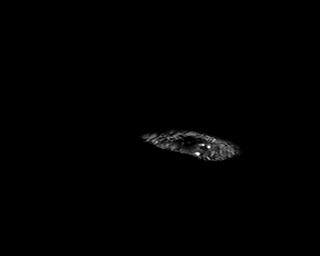
[im 7/27]
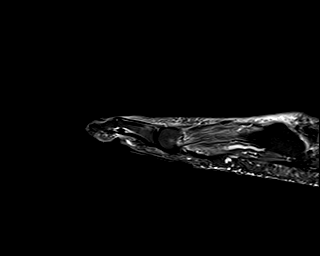
[im 14/27]
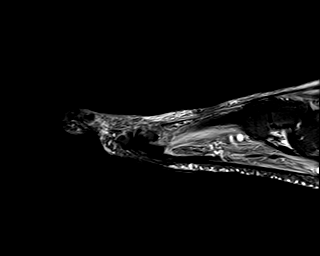
[im 20/27]
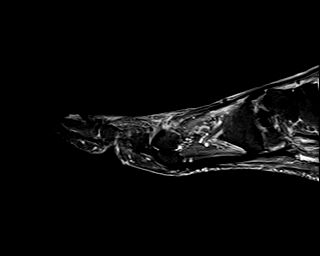
[im 27/27]
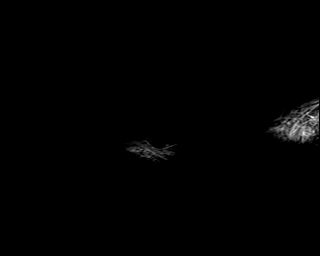

[40 of 40 positions shown; findings below may reference images not displayed]

FINDINGS: Bones/Joint/Cartilage

No marrow signal abnormality. No fracture or dislocation. Mild
osteoarthritis of the first MTP joint. No joint effusion.

Ligaments

Collateral ligaments are intact.  Lisfranc ligament is intact.

Muscles and Tendons
Flexor and extensor compartment tendons are intact. Severe atrophy
of the intrinsic foot muscles.

Soft tissue
Small skin ulceration/blistering at the plantar aspect of the third
MTP joint. No susceptibility artifact to suggest foreign body. No
fluid collection or hematoma. No soft tissue mass.
IMPRESSION: 1. Small skin ulceration/blistering at the plantar aspect of the
third MTP joint. No evidence of osteomyelitis.

## 2022-03-25 IMAGING — CR DG CHEST 2V
2 series · 2 of 2 positions shown · non-contrast
Comparison: Chest x-ray 05/11/2020, CT chest 05/01/2020

CLINICAL DATA: Suspected sepsis, discharge from hospital but blood
pressure suddenly draft and patient became dizzy

EXAM:
CHEST - 2 VIEW

[w chest lat]
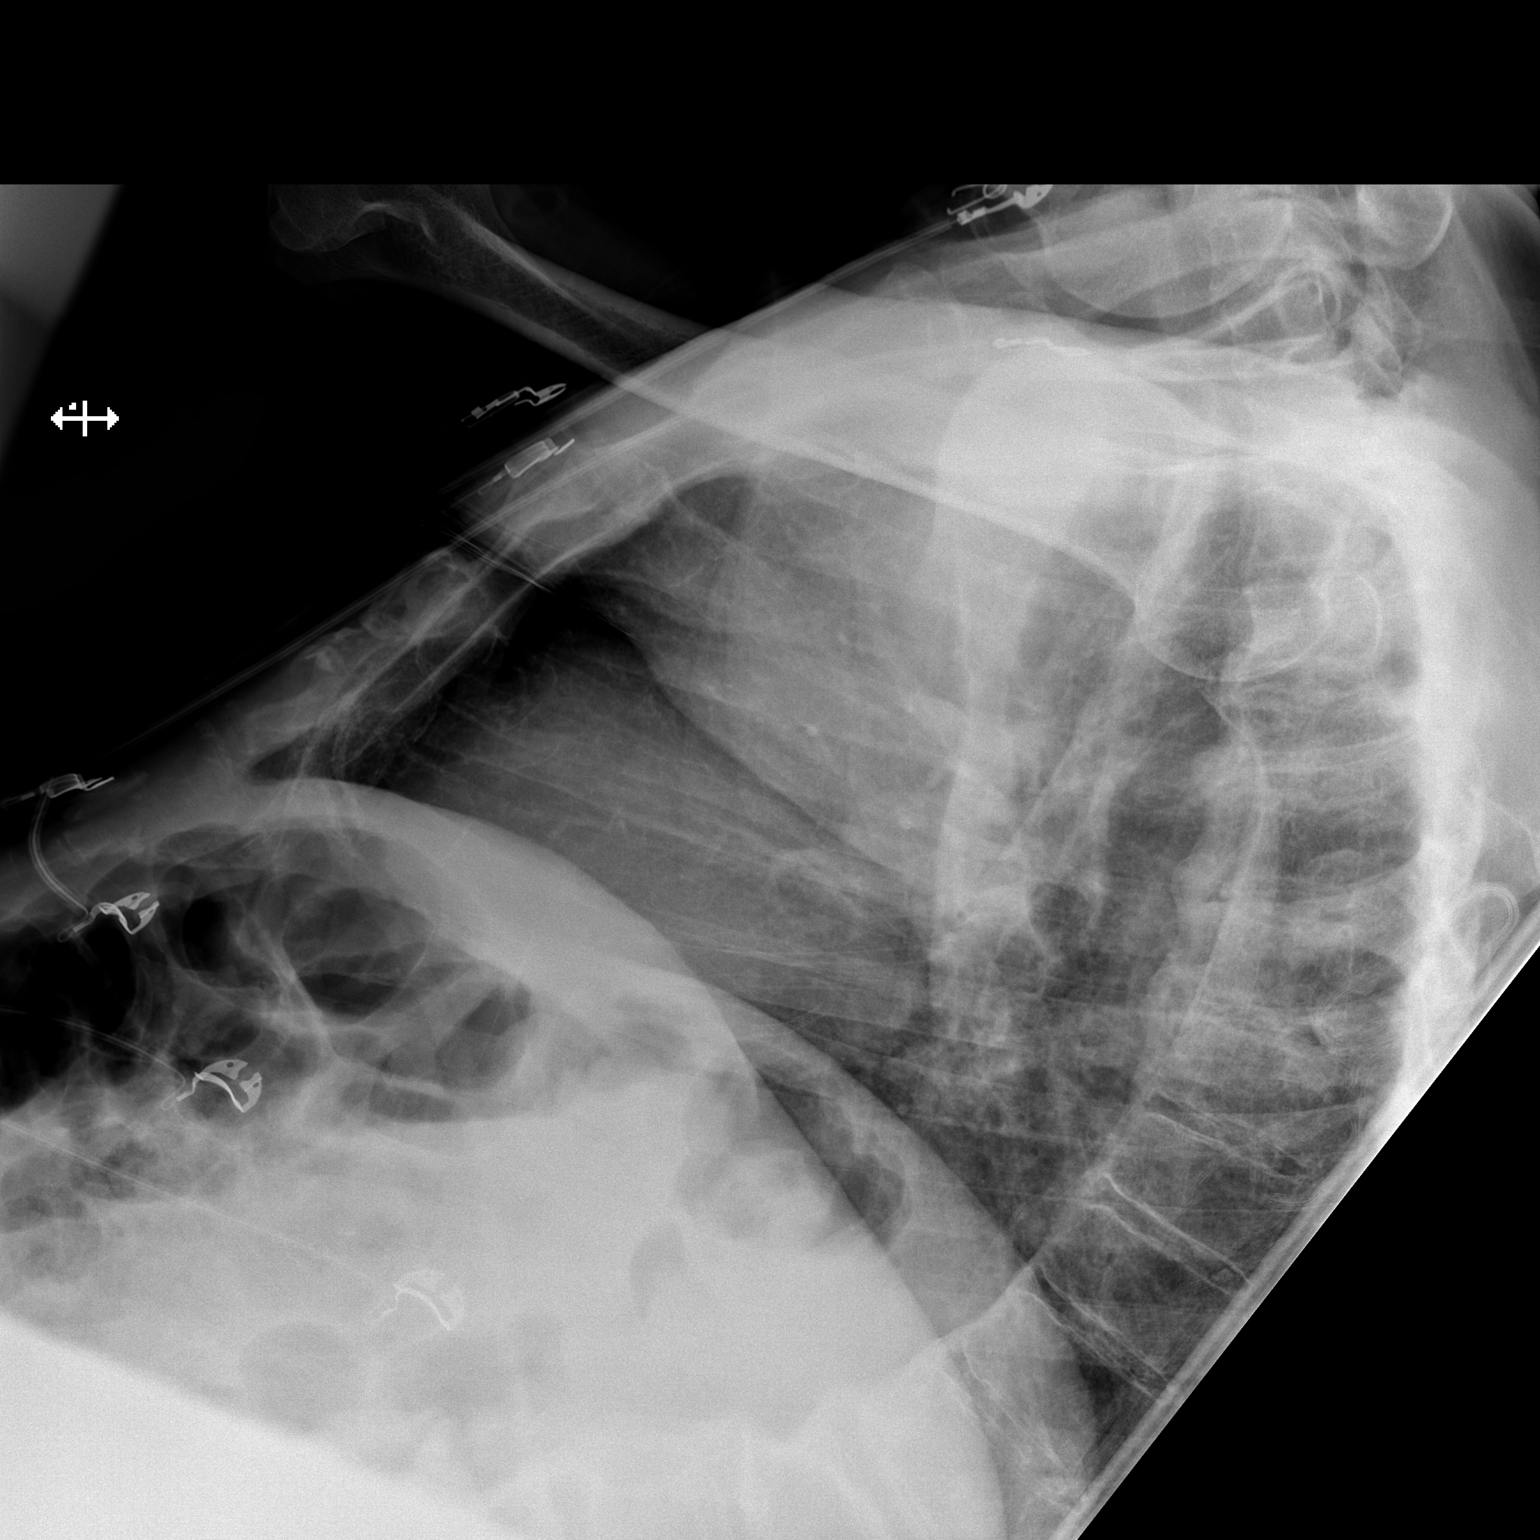

[x chest ap]
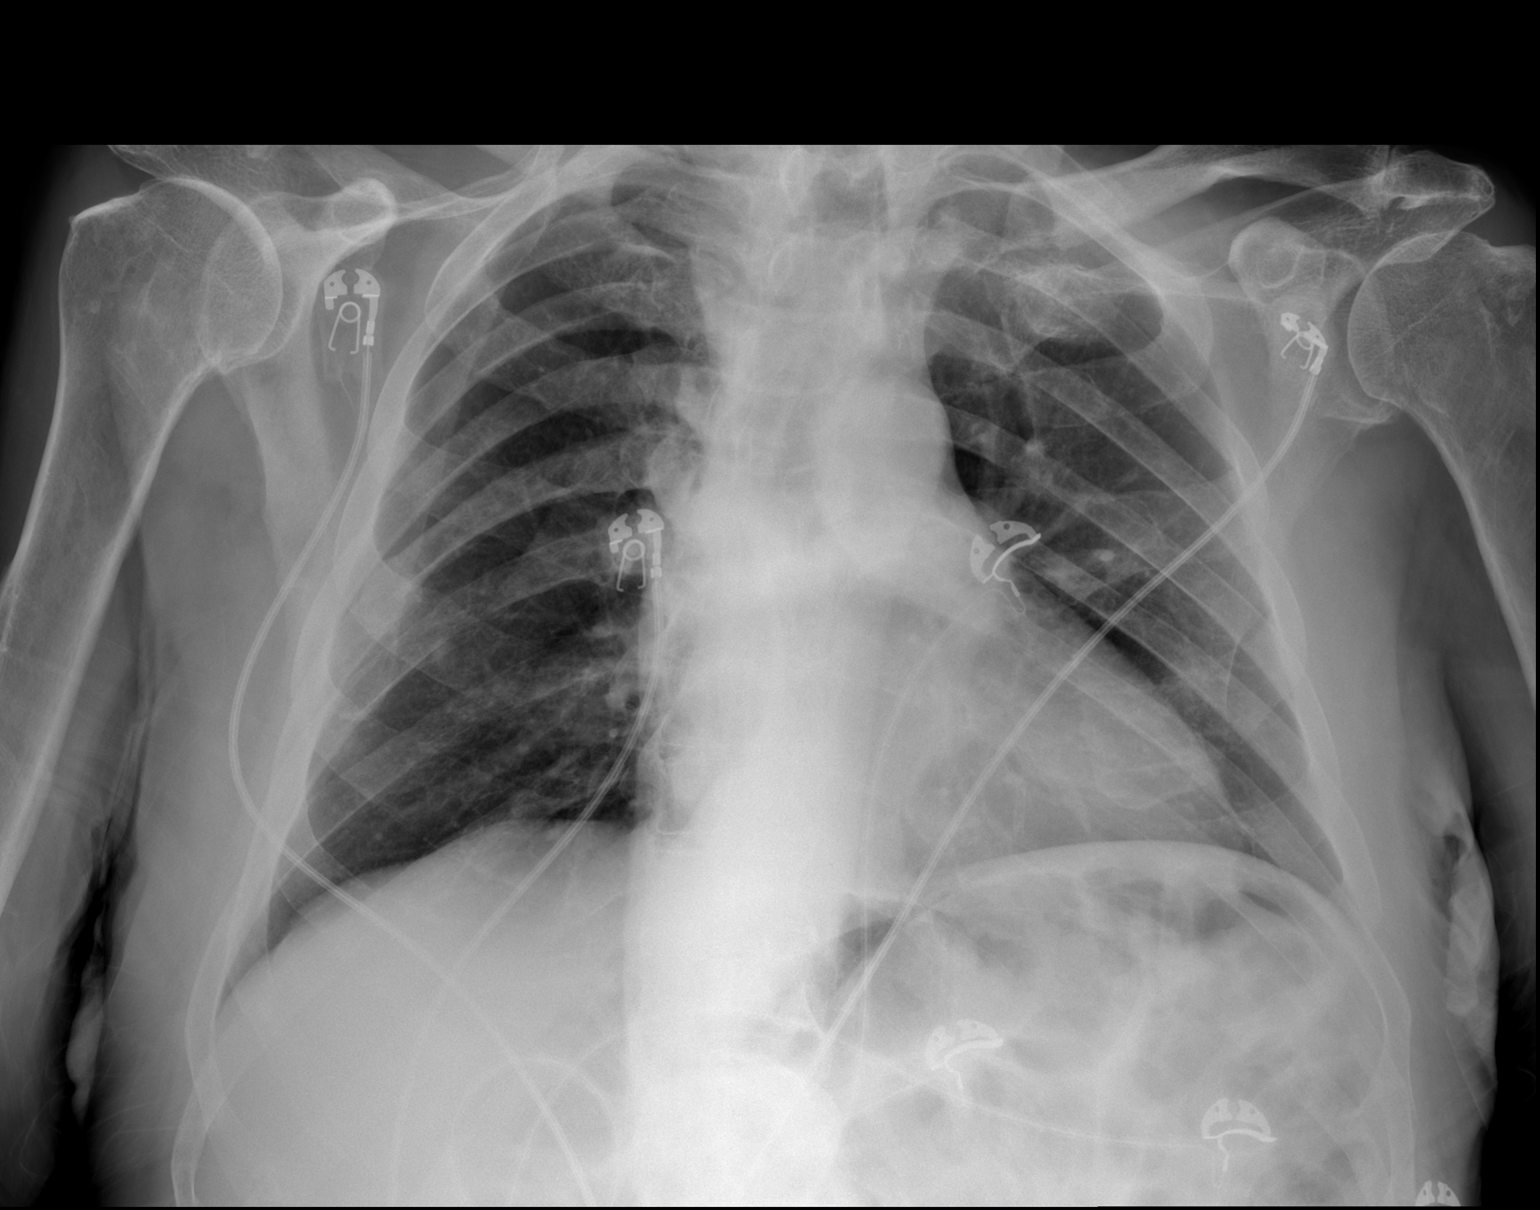

[2 of 2 positions shown; findings below may reference images not displayed]

FINDINGS: The heart size and mediastinal contours are within normal limits.
Coronary artery stent in grossly similar position. Lucency
posterior to the left ventricle consistent with known hiatal hernia.

No focal consolidation. No pulmonary edema. No pleural effusion. No
pneumothorax.

No acute osseous abnormality.  Degenerative changes of the spine.
IMPRESSION: No active cardiopulmonary disease.
